# Patient Record
Sex: Male | Born: 1955
Health system: Southern US, Community
[De-identification: ages and names within clinical notes are randomized; demographics above are authoritative.]

## PROBLEM LIST (undated history)

## (undated) DIAGNOSIS — I428 Other cardiomyopathies: Secondary | ICD-10-CM

## (undated) DIAGNOSIS — Z79899 Other long term (current) drug therapy: Secondary | ICD-10-CM

## (undated) DIAGNOSIS — I10 Essential (primary) hypertension: Secondary | ICD-10-CM

## (undated) DIAGNOSIS — E785 Hyperlipidemia, unspecified: Secondary | ICD-10-CM

## (undated) DIAGNOSIS — T7840XA Allergy, unspecified, initial encounter: Secondary | ICD-10-CM

## (undated) DIAGNOSIS — I4819 Other persistent atrial fibrillation: Secondary | ICD-10-CM

## (undated) DIAGNOSIS — Z5181 Encounter for therapeutic drug level monitoring: Secondary | ICD-10-CM

## (undated) DIAGNOSIS — I639 Cerebral infarction, unspecified: Secondary | ICD-10-CM

## (undated) DIAGNOSIS — I480 Paroxysmal atrial fibrillation: Principal | ICD-10-CM

## (undated) HISTORY — PX: EYE SURGERY: SHX253

## (undated) HISTORY — DX: Hyperlipidemia, unspecified: E78.5

## (undated) HISTORY — PX: TONSILLECTOMY: SUR1361

## (undated) HISTORY — DX: Allergy, unspecified, initial encounter: T78.40XA

## (undated) HISTORY — PX: WISDOM TOOTH EXTRACTION: SHX21

## (undated) HISTORY — DX: Essential (primary) hypertension: I10

## (undated) HISTORY — PX: OTHER SURGICAL HISTORY: SHX169

## (undated) HISTORY — DX: Other cardiomyopathies: I42.8

## (undated) HISTORY — DX: Paroxysmal atrial fibrillation: I48.0

## (undated) HISTORY — DX: Other persistent atrial fibrillation: I48.19

## (undated) SURGERY — Surgical Case
Anesthesia: *Unknown

---

## 2001-01-02 ENCOUNTER — Ambulatory Visit (HOSPITAL_BASED_OUTPATIENT_CLINIC_OR_DEPARTMENT_OTHER): Admission: RE | Admit: 2001-01-02 | Discharge: 2001-01-02 | Payer: Self-pay | Admitting: Plastic Surgery

## 2010-11-27 ENCOUNTER — Other Ambulatory Visit: Payer: Self-pay | Admitting: Orthopedic Surgery

## 2010-11-27 ENCOUNTER — Ambulatory Visit (HOSPITAL_BASED_OUTPATIENT_CLINIC_OR_DEPARTMENT_OTHER)
Admission: RE | Admit: 2010-11-27 | Discharge: 2010-11-27 | Disposition: A | Discharge status: Home / Self Care | Payer: BC Managed Care – PPO | Source: Ambulatory Visit | Attending: Orthopedic Surgery | Admitting: Orthopedic Surgery

## 2010-11-27 DIAGNOSIS — M898X9 Other specified disorders of bone, unspecified site: Secondary | ICD-10-CM | POA: Insufficient documentation

## 2010-11-27 DIAGNOSIS — M6740 Ganglion, unspecified site: Secondary | ICD-10-CM | POA: Insufficient documentation

## 2011-03-02 NOTE — Op Note (Signed)
  NAME:  Dustin Schneider, Dustin Schneider              ACCOUNT NO.:  [id number]  MEDICAL RECORD NO.:  [id number]            PATIENT TYPE:  LOCATION:                                 FACILITY:  PHYSICIAN:  Cindee Salt, M.D.            DATE OF BIRTH:  DATE OF PROCEDURE:  11/27/2010 DATE OF DISCHARGE:                              OPERATIVE REPORT   PREOPERATIVE DIAGNOSIS:  Mucoid tumor, right middle finger.  POSTOPERATIVE DIAGNOSIS:  Mucoid tumor, right middle finger.  OPERATION:  Excision of mucoid cyst, debridement of distal interphalangeal joint, right middle finger.  SURGEON:  Cindee Salt, MD  ANESTHESIA:  Forearm-based IV regional with metacarpal block.  ANESTHESIOLOGISTJean Rosenthal.  HISTORY:  The patient is a 55 year old male with a history of a mucoid cyst of the distal interphalangeal joint area with significant deformation of his nail plate, right middle finger.  He is desirous of having this excised in that this has opened on occasion.  Pre, peri, postoperative courses have been discussed along with risks and complications.  He is aware that there is no guarantee with the surgery, possibility of infection, recurrence of injury to arteries, nerves, tendons, incomplete relief of symptoms, dystrophy.  In the preoperative area, the patient is seen.  The extremity marked by both the patient and surgeon.  Antibiotic given.  DESCRIPTION OF PROCEDURE:  The patient was brought to the operating room where a forearm-based IV regional anesthetic was carried out without difficulty, was prepped using DuraPrep, supine position with the right arm free.  A 3-minute dry time was allowed.  Time-out taken confirming the patient and procedure.  A curvilinear incision was made over the distal interphalangeal joint, right middle finger, carried down through subcutaneous tissue.  Bleeders were electrocauterized with bipolar. Dissection was then carried distally dorsal to the extensor tendon all to the level of  the cyst with blunt and sharp dissection.  This was identified and a small rongeur used to remove a portion of the cyst. Care was taken to protect the nail bed.  The joint was then opened.  The longitudinal incision.  The joint was then debrided.  A synovectomy performed and debridement of osteophytes performed with a small rongeur. The wound was copiously irrigated with saline.  The skin then closed with interrupted 5-0 Vicryl Rapide sutures.  Sterile compressive dressing and splint to the finger were applied.  Prior to application of the splint, a metacarpal block was given with 0.25% Marcaine without epinephrine, 9 mL was used.  With deflation of the tourniquet, all fingers immediately pinked.  He was taken to the recovery room for observation in satisfactory condition.  He will be discharged home to return to the [facility] [place] in 1 week on Vicodin.          ______________________________ Cindee Salt, M.D.     GK/MEDQ  D:  11/27/2010  T:  11/28/2010  Job:  [id number]  Electronically Signed by Cindee Salt M.D. on 03/02/2011 09:08:47 AM

## 2012-05-01 ENCOUNTER — Encounter (INDEPENDENT_AMBULATORY_CARE_PROVIDER_SITE_OTHER): Payer: Self-pay

## 2012-05-02 ENCOUNTER — Encounter (INDEPENDENT_AMBULATORY_CARE_PROVIDER_SITE_OTHER): Payer: Self-pay | Admitting: Surgery

## 2012-05-02 ENCOUNTER — Ambulatory Visit (INDEPENDENT_AMBULATORY_CARE_PROVIDER_SITE_OTHER): Payer: BC Managed Care – PPO | Admitting: Surgery

## 2012-05-02 VITALS — BP 132/82 | HR 72 | Temp 97.8°F | Resp 18 | Ht 68.0 in | Wt 165.0 lb

## 2012-05-02 DIAGNOSIS — L989 Disorder of the skin and subcutaneous tissue, unspecified: Secondary | ICD-10-CM

## 2012-05-02 NOTE — Progress Notes (Signed)
Patient ID: Dustin Schneider, male   DOB: 08-07-1956, 56 y.o.   MRN: [id number]  No chief complaint on file.   HPI Dustin Schneider is a 56 y.o. male.  Patient presents for multiple skin lesions on scalp. These areas being larger and causing discomfort. HPI  History reviewed. No pertinent past medical history.  Past Surgical History  Procedure Date  . Tonsillectomy   . Excision of actinic keratosis   . Eye surgery     eye lift    History reviewed. No pertinent family history.  Social History History  Substance Use Topics  . Smoking status: Never Smoker   . Smokeless tobacco: Never Used  . Alcohol Use: Yes    Allergies no known allergies  Current Outpatient Prescriptions  Medication Sig Dispense Refill  . aspirin 81 MG tablet Take 81 mg by mouth daily.        Review of Systems Review of Systems  Constitutional: Negative for fever, chills and unexpected weight change.  HENT: Negative for hearing loss, congestion, sore throat, trouble swallowing and voice change.   Eyes: Negative for visual disturbance.  Respiratory: Negative for cough and wheezing.   Cardiovascular: Negative for chest pain, palpitations and leg swelling.  Gastrointestinal: Negative for nausea, vomiting, abdominal pain, diarrhea, constipation, blood in stool, abdominal distention, anal bleeding and rectal pain.  Genitourinary: Negative for hematuria and difficulty urinating.  Musculoskeletal: Negative for arthralgias.  Skin: Negative for rash and wound.  Neurological: Negative for seizures, syncope, weakness and headaches.  Hematological: Negative for adenopathy. Does not bruise/bleed easily.  Psychiatric/Behavioral: Negative for confusion.    Blood pressure 132/82, pulse 72, temperature 97.8 F (36.6 C), temperature source Temporal, resp. rate 18, height 5\' 8"  (1.727 m), weight 165 lb (74.844 kg).  Physical Exam Physical Exam  Constitutional: He is oriented to person, place, and time. He appears  well-developed and well-nourished.  HENT:  Head:    Eyes: EOM are normal. Pupils are equal, round, and reactive to light.  Neurological: He is alert and oriented to person, place, and time.  Skin: Skin is warm and dry.     Psychiatric: He has a normal mood and affect. His behavior is normal. Judgment and thought content normal.      Assessment    Multiple suspicious skin lesions scalp and left arm     Plan    Will need about 40 min to excise these lesions. Return to office at later time.        Dustin Schneider A. 05/02/2012, 11:10 AM

## 2012-05-02 NOTE — Patient Instructions (Signed)
Return to office for scalp lesion skin excision.

## 2012-06-10 ENCOUNTER — Encounter (INDEPENDENT_AMBULATORY_CARE_PROVIDER_SITE_OTHER): Payer: Self-pay | Admitting: Surgery

## 2012-06-10 ENCOUNTER — Ambulatory Visit (INDEPENDENT_AMBULATORY_CARE_PROVIDER_SITE_OTHER): Payer: BC Managed Care – PPO | Admitting: Surgery

## 2012-06-10 VITALS — BP 136/68 | HR 72 | Temp 97.6°F | Resp 20 | Ht 67.5 in | Wt 162.2 lb

## 2012-06-10 DIAGNOSIS — L989 Disorder of the skin and subcutaneous tissue, unspecified: Secondary | ICD-10-CM

## 2012-06-10 NOTE — Progress Notes (Signed)
Subjective:     Patient ID: Dustin Schneider, male   DOB: 02-Jul-1956, 56 y.o.   MRN: [id number]  HPI patient returns for excision of skin lesion from his left scalp. He had 2 other areas and these have disappeared.  Review of Systems  Constitutional: Negative.   HENT: Negative.   Respiratory: Negative.        Objective:   Physical Exam  Constitutional: He is oriented to person, place, and time.  HENT:  Head:    Neurological: He is alert and oriented to person, place, and time.  Skin: Skin is warm and dry.  Psychiatric: He has a normal mood and affect. His behavior is normal. Judgment and thought content normal.       Assessment:     Skin lesion scalp 1.3 cm     Plan:     Excise in the office.  Informed consent obtained. Left scalp was prepped and draped in a sterile fashion. 1% lidocaine with epinephrine used to inject the lesion in the left lateral scalp. It measured 1.3 cm. Curvilinear incision made around the lesion into the subcutaneous fat. Incision closed with 3-0 Vicryl and dressed with Neosporin. Procedure tolerated well. Wound instructions given. Followup as needed.

## 2012-06-10 NOTE — Patient Instructions (Signed)
Neosporin to wound daily.  Shower tomorrow.

## 2012-06-12 ENCOUNTER — Telehealth (INDEPENDENT_AMBULATORY_CARE_PROVIDER_SITE_OTHER): Payer: Self-pay

## 2012-06-12 NOTE — Telephone Encounter (Signed)
Called patient to give him benign path results. Left message on machine per patients request. Told him to call with any questions or concerns.

## 2018-04-23 ENCOUNTER — Encounter: Payer: Self-pay | Admitting: Internal Medicine

## 2018-05-05 ENCOUNTER — Encounter: Payer: Self-pay | Admitting: Internal Medicine

## 2018-05-12 NOTE — H&P (Signed)
OFFICE VISIT NOTES COPIED TO EPIC FOR DOCUMENTATION  . History of Present Illness Dustin Maine FNP-C; 04/30/2018 9:51 PM) Patient words: Follow up echo & lab results; Last office visit 03/19/18.  The patient is a 62 year old male who presents for a follow-up for Irregular heart beat. Dustin Schneider is a Caucasian male with newly noted atrial fibrillation with RVR that had not been seen by a physician in 7 years until recently evaluated by [place] for his palpitations. He was also noted to be hypertensive and was started on valsartan. He underwent echocardiogram and lab results for risk stratification and now presents to discuss results.  Patient continues to have palpitations, but felt that they have improved. He has not had any recurrence of chest pressure. He is tolerating medications well. He has continued to exercise with riding his bike twice weekly without exertional difficulty. He does admit to drinking 2-3 glasses of wine, a glass of scotch, and possibly one to 2 beers at night. His father also has a history of atrial fibrillation. Patient denies any symptoms of shortness of breath, fatigue, dyspnea on exertion, lightheadedness, syncope, or bleeding diathesis.   Problem List/Past Medical (April Harrington; 04/30/2018 12:00 PM) Arrhythmia (I49.9)  Laboratory examination (Z01.89)  04/03/2018: Creatinine 1.09, EGFR 71/82, potassium 4.6, BMP normal. CBC normal. TSH 3.14. Cholesterol 184, triglycerides 116, HDL 46, LDL 115. 03/20/2018: Creatinine 1.13, EGFR 69/80, potassium 4.4, ALT 49, CMP otherwise normal. 06/07/2006: Creatinine 1.0, Potassium 5.4, CMP normal. Cholesterol 247, Triglycerides 103, HDL 78, LDL 160. CBC normal. New onset atrial fibrillation (I48.91)  EKG 03/19/2018: Atrial fibrillation with rapid ventricular response at the rate of 120 bpm, normal axis, diffuse nonspecific ST-T abnormality. Cannot exclude inferior ischemia. Essential hypertension (I10)  Abnormal EKG (R94.31)    Allergies (April Harrington; 04/30/2018 12:00 PM) No Known Drug Allergies [03/19/2018]:  Family History (April Harrington; 04/30/2018 12:00 PM) Mother  In good health. glaucoma, no heart attacks or strokes, no known cardiovascular conditions Father  In stable health. no heart attacks or strokes, no known cardiovascular conditions  Social History (April Harrington; 04/30/2018 12:00 PM) Current tobacco use  Smokes cigars. rarely 1-2 times per year Alcohol Use  Occasional alcohol use. Marital status  Divorced. Living Situation  Lives with relatives. son lives with pt Number of Children  1.  Past Surgical History (April Harrington; 04/30/2018 12:00 PM) Cyst Removal  rt hand 5 yrs ago, from back 10 yrs ago  Medication History Dustin Maine, FNP-C; 04/30/2018 9:52 PM) Metoprolol Succinate ER (50MG Tablet ER 24HR, 1 (one) Tablet Oral daily, Taken starting 03/19/2018) Active. Xarelto (20MG Tablet, 1 (one) Tablet Oral every evening after dinner, Taken starting 03/19/2018) Active. Valsartan (160MG Tablet, 1 (one) Tablet Oral daily, Taken starting 03/19/2018) Active. Medications Reconciled (verbally with pt; medication present)  Diagnostic Studies History (April Harrington; 04/30/2018 12:01 PM) Echocardiogram  Echo- 04/23/2018 1. Left ventricle cavity is normal in size. Mild decrease in global wall motion. Calculated EF 45%. 2. Mild to moderate aortic regurgitation. 3. Moderate (Grade III) mitral regurgitation. 4. Mild tricuspid regurgitation. PA pressure could not be calculated due to insignificant TR signal. None [04/30/2018]: (Marked as Inactive)    Review of Systems Dustin Maine FNP-C; 04/30/2018 9:51 PM) General Not Present- Appetite Loss and Weight Gain. Respiratory Not Present- Chronic Cough and Wakes up from Sleep Wheezing or Short of Breath. Cardiovascular Present- Irregular Heart Beat. Not Present- Chest Pain, Claudications, Difficulty Breathing On Exertion,  Orthopnea, Paroxysmal Nocturnal Dyspnea and Shortness of Breath. Gastrointestinal  Present- Change in Bowel Habits (since being on Metoprolol). Not Present- Black, Tarry Stool, Bloody Stool, Difficulty Swallowing and Hematemesis. Musculoskeletal Not Present- Decreased Range of Motion and Muscle Atrophy. Neurological Not Present- Attention Deficit. Psychiatric Not Present- Personality Changes and Suicidal Ideation. Endocrine Not Present- Cold Intolerance and Heat Intolerance. Hematology Not Present- Abnormal Bleeding. All other systems negative  Vitals (April Harrington; 04/30/2018 12:08 PM) 04/30/2018 12:04 PM Weight: 169.31 lb Height: 67in Body Surface Area: 1.88 m Body Mass Index: 26.52 kg/m  Pulse: 68 (Regular)  P.OX: 98% (Room air) BP: 126/84 (Sitting, Left Arm, Standard)       Physical Exam Dustin Maine, FNP-C; 04/30/2018 10:0 PM) General Mental Status-Alert. General Appearance-Cooperative and Appears stated age. Build & Nutrition-Well built and Well nourished.  Head and Neck Thyroid Gland Characteristics - normal size and consistency and no palpable nodules.  Chest and Lung Exam Chest and lung exam reveals -quiet, even and easy respiratory effort with no use of accessory muscles, non-tender and on auscultation, normal breath sounds, no adventitious sounds.  Cardiovascular Cardiovascular examination reveals -carotid auscultation reveals no bruits, abdominal aorta auscultation reveals no bruits and no prominent pulsation, femoral artery auscultation bilaterally reveals normal pulses, no bruits, no thrills, normal pedal pulses bilaterally and no digital clubbing, cyanosis, edema, increased warmth or tenderness. Auscultation Rhythm - Irregularly irregular. Heart Sounds - S1 is variable, S2 WNL and No gallop present. Murmurs & Other Heart Sounds - Normal exam - No Murmurs.  Abdomen Palpation/Percussion Normal exam - Non Tender and No  hepatosplenomegaly.  Neurologic Neurologic evaluation reveals -alert and oriented x 3 with no impairment of recent or remote memory. Motor-Grossly intact without any focal deficits.  Musculoskeletal Global Assessment Left Lower Extremity - no deformities, masses or tenderness, no known fractures. Right Lower Extremity - no deformities, masses or tenderness, no known fractures.  Assessment & Plan Dustin Maine FNP-C; 04/30/2018 9:59 PM) Atrial fibrillation with controlled ventricular rate (I48.91) Story: EKG 04/30/2018: Atrial fibrillation with controlled ventricular response at the rate of 70 bpm, normal axis, diffuse nonspecific ST-T abnormality. Cannot exclude inferior ischemia. Compared to EKG 03/19/2018, rate is now controlled. Impression: CHA2DS2-VASc Score is 1 with yearly risk of stroke of 1.3%. (CHF; HTN; vasc disease DM, Male = 1; Age <65 =1;  65-74 = 2, >75 =3; stroke = 3).  Exercise sestamibi stress test 05/05/2018: 1. The resting electrocardiogram demonstrated atrial fibrillation.  The stress electrocardiogram was positive for ischemia with 2 mm downsloping ST depression in the inferior and lateral leads, persisted for 2 mintues into recovery. Stress symptoms included palpitations and fatigue. Patient exercised on Bruce protocol for 6:00 minutes and achieved 7.05 METS. Stress test terminated due to fatigue and 113% MPHR achieved (Target HR >85%).  2. Left ventricular cavity is noted to be normal on the rest and stress studies.  SPECT images demonstrate homogeneous tracer distribution throughout the myocardium.  The left ventricular ejection fraction was calculated  to be 38%, but visually appears to be at least low normal without wall motion abnormality.  This is an intermediate risk study due to abnormal ST change and low LVEF (difficult in view of gating with A. Fib), clinical correlation recommended.  Essential hypertension (I10) Abnormal EKG (R94.31) Hyperglycemia  (R73.9) Current Plans HEMOGLOBIN GLYCLATED (HGB A1C) (23953) Alcohol abuse (F10.10) Moderate mitral regurgitation (I34.0) Laboratory examination (U02.33) Story:  05/07/2018: Glucose 106, creatinine 1.18, EGFR 65/75, sodium 145, potassium 4.2, BMP otherwise normal. 04/03/2018: Creatinine 1.09, EGFR 71/82, potassium 4.6, BMP normal. CBC normal.  TSH 3.14. Cholesterol 184, triglycerides 116, HDL 46, LDL 115.  03/20/2018: Creatinine 1.13, EGFR 69/80, potassium 4.4, ALT 49, CMP otherwise normal.  06/07/2006: Creatinine 1.0, Potassium 5.4, CMP normal. Cholesterol 247, Triglycerides 103, HDL 78, LDL 160. CBC normal.  Note:-  Recommendations:  Patient presents for follow-up for atrial fibrillation and to discuss recently obtained labs and echocardiogram results. I have discussed echocardiogram results that shows mildly decreased systolic function with LVEF at 45%, mild-to-moderate aortic regurgitation, and moderate MR. He is without any clinical evidence of heart failure and is likely attributed to his atrial fibrillation. He is now rate controlled with metoprolol succinate and will continue the same. He will need ischemic workup and will obtain exercise nuclear stress test for further evaluation. (Reviewed, intermediate stress test with positive EKG change but no ischemia on perfusion imaging, suspect A. Fib related)  As he has been on anticoagulation for the last 4 weeks, we'll schedule for cardioversion. Schedule for Direct current cardioversion. I have discussed regarding risks benefits rate control vs rhythm control with the patient. Patient understands cardiac arrest and need for CPR, aspiration pneumonia, but not limited to these. Patient is willing. It will be scheduled for in the next few weeks.  Laboratory results were discussed with the patient, has mildly elevated lipids and depending upon stress test results, will decide if he needs statin therapy at that time. Encouraged him to make  diet changes and also cut back on his alcohol use. He was noted to have hyperglycemia and will obtain hemoglobin A1c for further evaluation. I will see him back after the procedure for follow-up.  CC: Antony Contras, MD    Signed by Dustin Maine, FNP-C (04/30/2018 10:00 PM)

## 2018-05-13 ENCOUNTER — Ambulatory Visit (HOSPITAL_COMMUNITY): Payer: BC Managed Care – PPO | Admitting: Anesthesiology

## 2018-05-13 ENCOUNTER — Encounter (HOSPITAL_COMMUNITY)
Admission: RE | Disposition: A | Discharge status: Home / Self Care | Payer: Self-pay | Source: Ambulatory Visit | Attending: Cardiology

## 2018-05-13 ENCOUNTER — Other Ambulatory Visit: Payer: Self-pay

## 2018-05-13 ENCOUNTER — Ambulatory Visit (HOSPITAL_COMMUNITY)
Admission: RE | Admit: 2018-05-13 | Discharge: 2018-05-13 | Disposition: A | Discharge status: Home / Self Care | Payer: BC Managed Care – PPO | Source: Ambulatory Visit | Attending: Cardiology | Admitting: Cardiology

## 2018-05-13 ENCOUNTER — Encounter (HOSPITAL_COMMUNITY): Payer: Self-pay | Admitting: *Deleted

## 2018-05-13 DIAGNOSIS — F1010 Alcohol abuse, uncomplicated: Secondary | ICD-10-CM | POA: Diagnosis not present

## 2018-05-13 DIAGNOSIS — I10 Essential (primary) hypertension: Secondary | ICD-10-CM | POA: Insufficient documentation

## 2018-05-13 DIAGNOSIS — I340 Nonrheumatic mitral (valve) insufficiency: Secondary | ICD-10-CM | POA: Diagnosis not present

## 2018-05-13 DIAGNOSIS — I4891 Unspecified atrial fibrillation: Secondary | ICD-10-CM | POA: Insufficient documentation

## 2018-05-13 DIAGNOSIS — F17290 Nicotine dependence, other tobacco product, uncomplicated: Secondary | ICD-10-CM | POA: Diagnosis not present

## 2018-05-13 DIAGNOSIS — R9431 Abnormal electrocardiogram [ECG] [EKG]: Secondary | ICD-10-CM | POA: Insufficient documentation

## 2018-05-13 DIAGNOSIS — R739 Hyperglycemia, unspecified: Secondary | ICD-10-CM | POA: Diagnosis not present

## 2018-05-13 HISTORY — PX: CARDIOVERSION: SHX1299

## 2018-05-13 SURGERY — CARDIOVERSION
Anesthesia: General

## 2018-05-13 MED ORDER — PROPOFOL 10 MG/ML IV BOLUS
INTRAVENOUS | Status: DC | PRN
  Administered 2018-05-13: 30 mg via INTRAVENOUS
  Administered 2018-05-13: 50 mg via INTRAVENOUS

## 2018-05-13 MED ORDER — LIDOCAINE 2% (20 MG/ML) 5 ML SYRINGE
INTRAMUSCULAR | Status: DC | PRN
  Administered 2018-05-13: 40 mg via INTRAVENOUS

## 2018-05-13 MED ORDER — SODIUM CHLORIDE 0.9 % IV SOLN
INTRAVENOUS | Status: DC | PRN
  Administered 2018-05-13: 14:00:00 via INTRAVENOUS

## 2018-05-13 NOTE — Transfer of Care (Signed)
Immediate Anesthesia Transfer of Care Note  Patient: Dustin Schneider  Procedure(s) Performed: CARDIOVERSION (N/A )  Patient Location: PACU and Endoscopy Unit  Anesthesia Type:General  Level of Consciousness: awake, alert  and patient cooperative  Airway & Oxygen Therapy: Patient Spontanous Breathing and Patient connected to nasal cannula oxygen  Post-op Assessment: Report given to RN and Post -op Vital signs reviewed and stable  Post vital signs: Reviewed and stable  Last Vitals:  Vitals Value Taken Time  BP    Temp    Pulse    Resp    SpO2      Last Pain:  Vitals:   05/13/18 1300  TempSrc: Oral  PainSc: 0-No pain         Complications: No apparent anesthesia complications

## 2018-05-13 NOTE — Anesthesia Postprocedure Evaluation (Signed)
Anesthesia Post Note  Patient: Dustin Schneider  Procedure(s) Performed: CARDIOVERSION (N/A )     Patient location during evaluation: PACU Anesthesia Type: General Level of consciousness: awake and alert Pain management: pain level controlled Vital Signs Assessment: post-procedure vital signs reviewed and stable Respiratory status: spontaneous breathing, nonlabored ventilation and respiratory function stable Cardiovascular status: blood pressure returned to baseline and stable Postop Assessment: no apparent nausea or vomiting Anesthetic complications: no    Last Vitals:  Vitals:   05/13/18 1401 05/13/18 1424  BP: 111/79 119/65  Pulse: 60   Resp: 20   Temp: 36.9 C   SpO2: 99%     Last Pain:  Vitals:   05/13/18 1401  TempSrc: Oral  PainSc: 0-No pain                 Dustin Pierro A.

## 2018-05-13 NOTE — Interval H&P Note (Signed)
History and Physical Interval Note:  05/13/2018 1:47 PM  Dustin Schneider  has presented today for surgery, with the diagnosis of AFIB  The various methods of treatment have been discussed with the patient and family. After consideration of risks, benefits and other options for treatment, the patient has consented to  Procedure(s): CARDIOVERSION (N/A) as a surgical intervention .  The patient's history has been reviewed, patient examined, no change in status, stable for surgery.  I have reviewed the patient's chart and labs.  Questions were answered to the patient's satisfaction.     [name]

## 2018-05-13 NOTE — CV Procedure (Signed)
Direct current cardioversion:  Indication symptomatic A. Fibrillation.  Procedure: Using 80 mg of IV Propofol and 40 IV Lidocaine (for reducing venous pain) for achieving deep sedation, synchronized direct current cardioversion performed. Patient was delivered with 120 Joules of electricity X 1 with success to NSR. Patient tolerated the procedure well. No immediate complication noted.

## 2018-05-13 NOTE — Anesthesia Procedure Notes (Signed)
Procedure Name: General with mask airway Date/Time: 05/13/2018 1:50 PM Performed by: Renato Shin, CRNA Pre-anesthesia Checklist: Patient identified, Emergency Drugs available, Suction available and Patient being monitored Patient Re-evaluated:Patient Re-evaluated prior to induction Oxygen Delivery Method: Ambu bag Preoxygenation: Pre-oxygenation with 100% oxygen Induction Type: IV induction Ventilation: Mask ventilation without difficulty Placement Confirmation: positive ETCO2,  CO2 detector and breath sounds checked- equal and bilateral Dental Injury: Teeth and Oropharynx as per pre-operative assessment

## 2018-05-13 NOTE — Anesthesia Preprocedure Evaluation (Signed)
Anesthesia Evaluation  Patient identified by MRN, date of birth, ID band Patient awake    Reviewed: Allergy & Precautions, NPO status , Patient's Chart, lab work & pertinent test results  Airway Mallampati: II  TM Distance: >3 FB Neck ROM: Full    Dental no notable dental hx. (+) Teeth Intact   Pulmonary neg pulmonary ROS,    Pulmonary exam normal breath sounds clear to auscultation       Cardiovascular negative cardio ROS Normal cardiovascular exam+ dysrhythmias Atrial Fibrillation  Rhythm:Regular Rate:Normal     Neuro/Psych negative neurological ROS  negative psych ROS   GI/Hepatic negative GI ROS, Neg liver ROS,   Endo/Other  negative endocrine ROS  Renal/GU negative Renal ROS  negative genitourinary   Musculoskeletal negative musculoskeletal ROS (+)   Abdominal   Peds  Hematology xarelto- last dose yesterday pm   Anesthesia Other Findings   Reproductive/Obstetrics                             Anesthesia Physical Anesthesia Plan  ASA: II  Anesthesia Plan: General   Post-op Pain Management:    Induction: Intravenous  PONV Risk Score and Plan: 2 and Ondansetron, Propofol infusion and Treatment may vary due to age or medical condition  Airway Management Planned: Mask  Additional Equipment:   Intra-op Plan:   Post-operative Plan:   Informed Consent: I have reviewed the patients History and Physical, chart, labs and discussed the procedure including the risks, benefits and alternatives for the proposed anesthesia with the patient or authorized representative who has indicated his/her understanding and acceptance.   Dental advisory given  Plan Discussed with: CRNA  Anesthesia Plan Comments:         Anesthesia Quick Evaluation

## 2018-05-13 NOTE — Discharge Instructions (Signed)
Electrical Cardioversion, Care After °This sheet gives you information about how to care for yourself after your procedure. Your health care provider may also give you more specific instructions. If you have problems or questions, contact your health care provider. °What can I expect after the procedure? °After the procedure, it is common to have: °· Some redness on the skin where the shocks were given. ° °Follow these instructions at home: °· Do not drive for 24 hours if you were given a medicine to help you relax (sedative). °· Take over-the-counter and prescription medicines only as told by your health care provider. °· Ask your health care provider how to check your pulse. Check it often. °· Rest for 48 hours after the procedure or as told by your health care provider. °· Avoid or limit your caffeine use as told by your health care provider. °Contact a health care provider if: °· You feel like your heart is beating too quickly or your pulse is not regular. °· You have a serious muscle cramp that does not go away. °Get help right away if: °· You have discomfort in your chest. °· You are dizzy or you feel faint. °· You have trouble breathing or you are short of breath. °· Your speech is slurred. °· You have trouble moving an arm or leg on one side of your body. °· Your fingers or toes turn cold or blue. °This information is not intended to replace advice given to you by your health care provider. Make sure you discuss any questions you have with your health care provider. °Document Released: 05/20/2013 Document Revised: 03/02/2016 Document Reviewed: 02/03/2016 °Elsevier Interactive Patient Education © 2018 [employer] ° °

## 2018-05-15 ENCOUNTER — Encounter (HOSPITAL_COMMUNITY): Payer: Self-pay | Admitting: Cardiology

## 2018-05-22 ENCOUNTER — Encounter: Payer: Self-pay | Admitting: Internal Medicine

## 2018-06-02 ENCOUNTER — Ambulatory Visit: Payer: BC Managed Care – PPO | Admitting: Internal Medicine

## 2018-06-02 ENCOUNTER — Encounter: Payer: Self-pay | Admitting: Internal Medicine

## 2018-06-02 VITALS — BP 136/80 | HR 86 | Ht 67.5 in | Wt 170.2 lb

## 2018-06-02 DIAGNOSIS — I4819 Other persistent atrial fibrillation: Secondary | ICD-10-CM | POA: Diagnosis not present

## 2018-06-02 DIAGNOSIS — Z23 Encounter for immunization: Secondary | ICD-10-CM | POA: Diagnosis not present

## 2018-06-02 NOTE — Progress Notes (Signed)
Electrophysiology Office Note   Date:  06/02/2018   ID:  AMADI FRADY, DOB [birth year], MRN [id number]  PCP:  none Cardiologist:  Dr [name] Primary Electrophysiologist: Thompson Grayer, MD    CC: afib   History of Present Illness: Dustin Schneider is a 62 y.o. male who presents today for electrophysiology evaluation.   He is referred by Dr [name] for EP consultation regarding atrial fibrillation. He reports having episodes of afib for about 10 years.  Episodes would typically be triggered by heavy ETOH and terminate overnight.  He has had increasing frequency and duration of afib with time.  He has been in persistent atrial fibrillation since July 2019.  Though he thinks AF started in July, the duration was unknown as he had not seen a physician in 7 years.  He had echo which revealed EF 45% with moderate MR.  He underwent cardioversion 05/13/18 but returned to AF the same day. He had stress test which was felt to be nonischemic.  He has palpitations and fatigue with his afib.  He is active.  He has cut back on ETOH but continues to drink 2 drinks per day.  Today, he denies symptoms of chest pain, shortness of breath, orthopnea, PND, lower extremity edema, claudication, dizziness, presyncope, syncope, bleeding, or neurologic sequela. The patient is tolerating medications without difficulties and is otherwise without complaint today.    Past Medical History:  Diagnosis Date  . Hypertension   . Nonischemic cardiomyopathy ([place])   . Persistent atrial fibrillation    Past Surgical History:  Procedure Laterality Date  . CARDIOVERSION N/A 05/13/2018   Procedure: CARDIOVERSION;  Surgeon: [name], MD;  Location: [place];  Service: Cardiovascular;  Laterality: N/A;  . excision of actinic keratosis    . EYE SURGERY     eye lift  . TONSILLECTOMY       Current Outpatient Medications  Medication Sig Dispense Refill  . metoprolol succinate (TOPROL-XL) 50 MG 24 hr tablet Take 50 mg by  mouth daily. Take with or immediately following a meal.    . rivaroxaban (XARELTO) 20 MG TABS tablet Take 20 mg by mouth daily with supper.    . valsartan (DIOVAN) 160 MG tablet Take 160 mg by mouth every evening.     No current facility-administered medications for this visit.     Allergies:   Patient has no known allergies.   Social History:  The patient  reports that he has never smoked. He has never used smokeless tobacco. He reports that he drinks alcohol. He reports that he does not use drugs.   Family History:  The patient's family history includes Atrial fibrillation in his father; Glaucoma in his mother.    ROS:  Please see the history of present illness.   All other systems are personally reviewed and negative.    PHYSICAL EXAM: VS:  BP 136/80   Pulse 86   Ht 5' 7.5" (1.715 m)   Wt 170 lb 3.2 oz (77.2 kg)   SpO2 98%   BMI 26.26 kg/m  , BMI Body mass index is 26.26 kg/m. GEN: Well nourished, well developed, in no acute distress  HEENT: normal  Neck: no JVD, carotid bruits, or masses Cardiac: iRRR; no murmurs, rubs, or gallops,no edema  Respiratory:  clear to auscultation bilaterally, normal work of breathing GI: soft, nontender, nondistended, + BS MS: no deformity or atrophy  Skin: warm and dry  Neuro:  Strength and sensation are intact Psych: euthymic mood, full  affect  EKG:  EKG is ordered today. The ekg ordered today is personally reviewed and shows afib, V rate 86 bpm, Qtc 445 msec, nonspecific ST/T changes    Wt Readings from Last 3 Encounters:  06/02/18 170 lb 3.2 oz (77.2 kg)  05/13/18 164 lb (74.4 kg)  06/10/12 162 lb 3.2 oz (73.6 kg)      Other studies personally reviewed: Additional studies/ records that were reviewed today include: 14 pages of records from Dr Irven Shelling office are reviewed  Review of the above records today demonstrates: as above  Echo 04/23/18- EF 45%, mild to moderate AI, moderate MR, LA 68mm,   ASSESSMENT AND PLAN:  1.   Persistent afib The patient has symptomatic, recurrent persistent atrial fibrillation. He has not tried AAD therapy Chads2vasc score is 2.  he is anticoagulated with xarelto. Therapeutic strategies for afib including medicine (tikosyn) and ablation were discussed in detail with the patient today. HRS guidelines are clear that ablation is not to be used as primary treatment strategy for persistent afib.  AAD therapy should be first line.  We discussed options at length today.  I think that his best option would be tikosyn.  He would prefer a conservative approach.  We will therefore arrange for tikosyn admission at the next available time.  2. ETOH Avoidance encouraged  3. Nonischemic CM Hopefully EF will recover with sinus rhythm    Current medicines are reviewed at length with the patient today.   The patient does not have concerns regarding his medicines.  The following changes were made today:  none  Labs/ tests ordered today include:  Orders Placed This Encounter  Procedures  . Flu Vaccine QUAD 36+ mos IM  . EKG 12-Lead     Signed, Thompson Grayer, MD  06/02/2018 9:32 AM     [facility] [address] [place] [address] [phone] (office) [phone] (fax)

## 2018-06-02 NOTE — Patient Instructions (Addendum)
Medication Instructions:  Your physician recommends that you continue on your current medications as directed. Please refer to the Current Medication list given to you today.  Labwork: None ordered.  Testing/Procedures: None ordered.  Follow-Up: You will go to the Afib clinic on:  June 12, 2018 at 9:30 am   Any Other Special Instructions Will Be Listed Below (If Applicable).  If you need a refill on your cardiac medications before your next appointment, please call your pharmacy.    Dofetilide capsules What is this medicine? DOFETILIDE (doe FET il ide) is an antiarrhythmic drug. It helps make your heart beat regularly. This medicine also helps to slow rapid heartbeats. This medicine may be used for other purposes; ask your health care provider or pharmacist if you have questions. COMMON BRAND NAME(S): Tikosyn What should I tell my health care provider before I take this medicine? They need to know if you have any of these conditions: -heart disease -history of low levels of potassium or magnesium -kidney disease -liver disease -an unusual or allergic reaction to dofetilide, other medicines, foods, dyes, or preservatives -pregnant or trying to get pregnant -breast-feeding How should I use this medicine? Take this medicine by mouth with a glass of water. Follow the directions on the prescription label. You can take this medicine with or without food. Do not drink grapefruit juice with this medicine. Take your doses at regular intervals. Do not take your medicine more often than directed. Do not stop taking this medicine suddenly. This may cause serious, heart-related side effects. Your doctor will tell you how much medicine to take. If your doctor wants you to stop the medicine, the dose will be slowly lowered over time to avoid any side effects. Talk to your pediatrician regarding the use of this medicine in children. Special care may be needed. Overdosage: If you think you have  taken too much of this medicine contact a poison control center or emergency room at once. NOTE: This medicine is only for you. Do not share this medicine with others. What if I miss a dose? If you miss a dose, take it as soon as you can. If it is almost time for your next dose, take only that dose. Do not take double or extra doses. What may interact with this medicine? Do not take this medicine with any of the following medications: -cimetidine -dolutegravir -hydrochlorothiazide alone or in combination with other medicines -isavuconazonium -ketoconazole -megestrol -other medicines that prolong the QT interval (cause an abnormal heart rhythm) -prochlorperazine -trimethoprim alone or in combination with sulfamethoxazole -verapamil This medicine may also interact with the following medications: -amiloride -certain antidepressants like fluvoxamine or paroxetine -certain antiviral medicines for HIV or AIDS like atazanavir or darunavir -certain medicines for fungal infections like clotrimazole or miconazole -digoxin -diltiazem -dronabinol, THC -grapefruit juice -metformin -nefazodone -triamterene -zafirlukast This list may not describe all possible interactions. Give your health care provider a list of all the medicines, herbs, non-prescription drugs, or dietary supplements you use. Also tell them if you smoke, drink alcohol, or use illegal drugs. Some items may interact with your medicine. What should I watch for while using this medicine? Visit your doctor or health care professional for regular checks on your progress. Wear a medical ID bracelet or chain, and carry a card that describes your disease and details of your medicine and dosage times. Check your heart rate and blood pressure regularly while you are taking this medicine. Ask your doctor or health care professional what your heart  rate and blood pressure should be, and when you should contact him or her. Your doctor or health  care professional also may schedule regular tests to check your progress. You will be started on this medicine in a specialized facility for at least three days. You will be monitored to find the right dose of medicine for you. It is very important that you take your medicine exactly as prescribed when you leave the hospital. The correct dosing of this medicine is very important to treat your condition and prevent possible serious side effects. What side effects may I notice from receiving this medicine? Side effects that you should report to your doctor or health care professional as soon as possible: -allergic reactions like skin rash, itching or hives, swelling of the face, lips, or tongue -breathing problems -dizziness -fast or rapid beating of the heart -feeling faint or lightheaded -swelling of the ankles -unusually weak or tired -vomiting Side effects that usually do not require medical attention (report to your doctor or health care professional if they continue or are bothersome): -cough -diarrhea -difficulty sleeping -headache -nausea -stomach pain This list may not describe all possible side effects. Call your doctor for medical advice about side effects. You may report side effects to FDA at 1-800-FDA-1088. Where should I keep my medicine? Keep out of the reach of children. Store at room temperature between 15 and 30 degrees C (59 and 86 degrees F). Protect the medicine from moisture or humidity. Keep container tightly closed. Throw away any unused medicine after the expiration date. NOTE: This sheet is a summary. It may not cover all possible information. If you have questions about this medicine, talk to your doctor, pharmacist, or health care provider.  2018 Elsevier/Gold Standard (2016-06-11 95:18:84)

## 2018-06-06 ENCOUNTER — Telehealth: Payer: Self-pay | Admitting: Pharmacist

## 2018-06-06 NOTE — Telephone Encounter (Signed)
Medication list reviewed in anticipation of upcoming Tikosyn initiation. Patient is not taking any contraindicated or QTc prolonging medications based on current medication list.   Patient is anticoagulated on Xarelto on the appropriate dose. Please ensure that patient has not missed any anticoagulation doses in the 3 weeks prior to Tikosyn initiation.   Patient will need to be counseled to avoid use of Benadryl while on Tikosyn and in the 2-3 days prior to Tikosyn initiation.

## 2018-06-12 ENCOUNTER — Other Ambulatory Visit: Payer: Self-pay

## 2018-06-12 ENCOUNTER — Inpatient Hospital Stay (HOSPITAL_COMMUNITY)
Admission: AD | Admit: 2018-06-12 | Discharge: 2018-06-15 | DRG: 310 | Disposition: A | Discharge status: Home / Self Care | Payer: BC Managed Care – PPO | Attending: Internal Medicine | Admitting: Internal Medicine

## 2018-06-12 ENCOUNTER — Encounter (HOSPITAL_COMMUNITY): Payer: Self-pay | Admitting: General Practice

## 2018-06-12 ENCOUNTER — Ambulatory Visit (HOSPITAL_COMMUNITY)
Admission: RE | Admit: 2018-06-12 | Discharge: 2018-06-12 | Disposition: A | Discharge status: Home / Self Care | Payer: BC Managed Care – PPO | Source: Ambulatory Visit | Attending: Nurse Practitioner | Admitting: Nurse Practitioner

## 2018-06-12 ENCOUNTER — Encounter (HOSPITAL_COMMUNITY): Payer: Self-pay | Admitting: Nurse Practitioner

## 2018-06-12 VITALS — BP 118/72 | HR 80 | Ht 67.5 in | Wt 171.0 lb

## 2018-06-12 DIAGNOSIS — Z7901 Long term (current) use of anticoagulants: Secondary | ICD-10-CM

## 2018-06-12 DIAGNOSIS — I4819 Other persistent atrial fibrillation: Secondary | ICD-10-CM

## 2018-06-12 DIAGNOSIS — Z79899 Other long term (current) drug therapy: Secondary | ICD-10-CM

## 2018-06-12 DIAGNOSIS — I480 Paroxysmal atrial fibrillation: Secondary | ICD-10-CM

## 2018-06-12 DIAGNOSIS — E876 Hypokalemia: Secondary | ICD-10-CM | POA: Diagnosis present

## 2018-06-12 DIAGNOSIS — Z5181 Encounter for therapeutic drug level monitoring: Secondary | ICD-10-CM | POA: Diagnosis not present

## 2018-06-12 DIAGNOSIS — I428 Other cardiomyopathies: Secondary | ICD-10-CM

## 2018-06-12 DIAGNOSIS — I10 Essential (primary) hypertension: Secondary | ICD-10-CM | POA: Diagnosis present

## 2018-06-12 HISTORY — DX: Encounter for therapeutic drug level monitoring: Z51.81

## 2018-06-12 HISTORY — DX: Other long term (current) drug therapy: Z79.899

## 2018-06-12 LAB — BASIC METABOLIC PANEL
Anion gap: 6 (ref 5–15)
BUN: 21 mg/dL (ref 8–23)
CALCIUM: 9.5 mg/dL (ref 8.9–10.3)
CO2: 25 mmol/L (ref 22–32)
CREATININE: 1.13 mg/dL (ref 0.61–1.24)
Chloride: 107 mmol/L (ref 98–111)
GFR calc non Af Amer: >60 mL/min (ref >60)
Glucose, Bld: 108 mg/dL — ABNORMAL HIGH (ref 70–99)
Potassium: 4 mmol/L (ref 3.5–5.1)
SODIUM: 138 mmol/L (ref 135–145)

## 2018-06-12 LAB — MAGNESIUM: Magnesium: 2.1 mg/dL (ref 1.7–2.4)

## 2018-06-12 MED ORDER — SODIUM CHLORIDE 0.9% FLUSH: 3.0000 mL | INTRAVENOUS | Status: DC | PRN

## 2018-06-12 MED ORDER — RIVAROXABAN 20 MG PO TABS
20.0000 mg | ORAL_TABLET | Freq: Every day | ORAL | Status: DC
  Administered 2018-06-12 – 2018-06-14 (×3): 20 mg via ORAL
  Filled 2018-06-12 (×3): qty 1

## 2018-06-12 MED ORDER — SODIUM CHLORIDE 0.9% FLUSH
3.0000 mL | Freq: Two times a day (BID) | INTRAVENOUS | Status: DC
  Administered 2018-06-12: 3 mL via INTRAVENOUS

## 2018-06-12 MED ORDER — SODIUM CHLORIDE 0.9 % IV SOLN
250.0000 mL | INTRAVENOUS | Status: DC
  Administered 2018-06-13: 11:00:00 via INTRAVENOUS

## 2018-06-12 MED ORDER — DOFETILIDE 500 MCG PO CAPS
500.0000 ug | ORAL_CAPSULE | Freq: Two times a day (BID) | ORAL | Status: DC
  Administered 2018-06-12: 500 ug via ORAL
  Filled 2018-06-12: qty 1

## 2018-06-12 MED ORDER — SODIUM CHLORIDE 0.9 % IV SOLN: 250.0000 mL | INTRAVENOUS | Status: DC | PRN

## 2018-06-12 MED ORDER — SODIUM CHLORIDE 0.9% FLUSH
3.0000 mL | Freq: Two times a day (BID) | INTRAVENOUS | Status: DC
  Administered 2018-06-13 – 2018-06-15 (×5): 3 mL via INTRAVENOUS

## 2018-06-12 MED ORDER — IRBESARTAN 150 MG PO TABS: 150.0000 mg | ORAL_TABLET | Freq: Every day | ORAL | Status: DC

## 2018-06-12 MED ORDER — METOPROLOL SUCCINATE ER 50 MG PO TB24
50.0000 mg | ORAL_TABLET | Freq: Every day | ORAL | Status: DC
  Administered 2018-06-13 – 2018-06-15 (×3): 50 mg via ORAL
  Filled 2018-06-12 (×3): qty 1

## 2018-06-12 MED ORDER — IRBESARTAN 150 MG PO TABS
150.0000 mg | ORAL_TABLET | Freq: Every day | ORAL | Status: DC
  Administered 2018-06-12 – 2018-06-14 (×3): 150 mg via ORAL
  Filled 2018-06-12 (×3): qty 1

## 2018-06-12 NOTE — Progress Notes (Signed)
Primary Care Physician: System, Pcp Not In Referring Physician: Dr. Rayann Heman Cardiologist: Dr. Mordecai Rasmussen is a 62 y.o. male with a h/o persistent afib since this past July, per pt,exact onset unknown,  that is in the afib clinic for admission for Tikosyn. No benadryl use, no missed anticoagulation. He is aware of the cost of the drug and can afford.Pt states that he was using alcohol but has cut way back on amount.  Today, he denies symptoms of palpitations, chest pain, shortness of breath, orthopnea, PND, lower extremity edema, dizziness, presyncope, syncope, or neurologic sequela. The patient is tolerating medications without difficulties and is otherwise without complaint today.   Past Medical History:  Diagnosis Date  . Hypertension   . Nonischemic cardiomyopathy ([place])   . Persistent atrial fibrillation    Past Surgical History:  Procedure Laterality Date  . CARDIOVERSION N/A 05/13/2018   Procedure: CARDIOVERSION;  Surgeon: [name], MD;  Location: [place];  Service: Cardiovascular;  Laterality: N/A;  . excision of actinic keratosis    . EYE SURGERY     eye lift  . TONSILLECTOMY      Current Outpatient Medications  Medication Sig Dispense Refill  . metoprolol succinate (TOPROL-XL) 50 MG 24 hr tablet Take 50 mg by mouth daily. Take with or immediately following a meal.    . rivaroxaban (XARELTO) 20 MG TABS tablet Take 20 mg by mouth daily with supper.    . valsartan (DIOVAN) 160 MG tablet Take 160 mg by mouth every evening.     No current facility-administered medications for this encounter.     No Known Allergies  Social History   Socioeconomic History  . Marital status: Divorced    Spouse name: Not on file  . Number of children: Not on file  . Years of education: Not on file  . Highest education level: Not on file  Occupational History  . Not on file  Social Needs  . Financial resource strain: Not on file  . Food insecurity:    Worry: Not on  file    Inability: Not on file  . Transportation needs:    Medical: Not on file    Non-medical: Not on file  Tobacco Use  . Smoking status: Never Smoker  . Smokeless tobacco: Never Used  Substance and Sexual Activity  . Alcohol use: Yes  . Drug use: No  . Sexual activity: Not on file    Comment: DIVORCE  Lifestyle  . Physical activity:    Days per week: Not on file    Minutes per session: Not on file  . Stress: Not on file  Relationships  . Social connections:    Talks on phone: Not on file    Gets together: Not on file    Attends religious service: Not on file    Active member of club or organization: Not on file    Attends meetings of clubs or organizations: Not on file    Relationship status: Not on file  . Intimate partner violence:    Fear of current or ex partner: Not on file    Emotionally abused: Not on file    Physically abused: Not on file    Forced sexual activity: Not on file  Other Topics Concern  . 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  Glaucoma Mother   . Atrial fibrillation Father     ROS- All systems are reviewed and negative except as per the HPI above  Physical Exam: Vitals:   06/12/18 0955  BP: 118/72  Pulse: 80  Weight: 77.6 kg  Height: 5' 7.5" (1.715 m)   Wt Readings from Last 3 Encounters:  06/12/18 77.6 kg  06/02/18 77.2 kg  05/13/18 74.4 kg    Labs: No results found for: NA, K, CL, CO2, GLUCOSE, BUN, CREATININE, CALCIUM, PHOS, MG No results found for: INR No results found for: CHOL, HDL, LDLCALC, TRIG   GEN- The patient is well appearing, alert and oriented x 3 today.   Head- normocephalic, atraumatic Eyes-  Sclera clear, conjunctiva pink Ears- hearing intact Oropharynx- clear Neck- supple, no JVP Lymph- no cervical lymphadenopathy Lungs- Clear to ausculation bilaterally, normal work of breathing Heart-  Regular rate and rhythm, no murmurs, rubs or gallops, PMI not laterally displaced GI- soft, NT, ND, + BS Extremities- no clubbing, cyanosis, or edema MS- no significant deformity or atrophy Skin- no rash or lesion Psych- euthymic mood, full affect Neuro- strength and sensation are intact  EKG-afib at 80 bpm, qrs int 84 ms, qtc 394 ms   Assessment and Plan: 1. Persistent  afib For admission  today for Tikosyn loading per Dr. Rayann Heman Aware of cost of drug No benadryl use No interruption in anticoagulation, continue xarelto for chadsvasc score of 2 (LVdysfunction,HTN) qtc acceptable at 394 ms No qtc prolonging drugs on board  To [place]. Dsean Vantol, [facility] [address] [place], [place] [address] [phone]

## 2018-06-12 NOTE — H&P (Addendum)
Cardiology Admission History and Physical:   Patient ID: [name] MRN: [id number]; DOB: September 21, 1955   Admission date: 06/12/2018  Primary Care Provider: System, Pcp Not In Primary Cardiologist: Dr. [name] Primary Electrophysiologist:  Dr. Rayann Heman  Chief Complaint:  Tikosyn initiation  Patient Profile:   Dustin Schneider is a 62 y.o. male with PMHx of HTN, NICM, and AFib.  History of Present Illness:   [name]  with hx of some years of short episodes of AF over the years, usually triggered by ETOH, per pt, though now with has had persistent afib since this past July, had DCCV with ERAF, on xarelto for a/c  He comes today for Tikosyn initiation, patient assures no missed Xarelto dose in 3 weeks, and longer.  He is aware of his irregular heart beat, quite "off-putting" sensation and bothersome to him.    He reports Dr. Rayann Heman and Butch Penny discussed potential benefits/risks of Tikosyn with no f/u questions, we discussed protocol and plan for DCCV tomorrow if needed, he is agreeable to proceed   Past Medical History:  Diagnosis Date  . Hypertension   . Nonischemic cardiomyopathy ([place])   . Persistent atrial fibrillation     Past Surgical History:  Procedure Laterality Date  . CARDIOVERSION N/A 05/13/2018   Procedure: CARDIOVERSION;  Surgeon: [name], MD;  Location: [place];  Service: Cardiovascular;  Laterality: N/A;  . excision of actinic keratosis    . EYE SURGERY     eye lift  . TONSILLECTOMY       Medications Prior to Admission: Prior to Admission medications   Medication Sig Start Date End Date Taking? Authorizing Provider  metoprolol succinate (TOPROL-XL) 50 MG 24 hr tablet Take 50 mg by mouth daily. Take with or immediately following a meal.    [provider]  rivaroxaban (XARELTO) 20 MG TABS tablet Take 20 mg by mouth daily with supper.    [provider]  valsartan (DIOVAN) 160 MG tablet Take 160 mg by mouth every evening.     [provider]     Allergies:   No Known Allergies  Social History:   Social History   Socioeconomic History  . Marital status: Divorced    Spouse name: Not on file  . Number of children: Not on file  . Years of education: Not on file  . Highest education level: Not on file  Occupational History  . Not on file  Social Needs  . Financial resource strain: Not on file  . Food insecurity:    Worry: Not on file    Inability: Not on file  . Transportation needs:    Medical: Not on file    Non-medical: Not on file  Tobacco Use  . Smoking status: Never Smoker  . Smokeless tobacco: Never Used  Substance and Sexual Activity  . Alcohol use: Yes  . Drug use: No  . Sexual activity: Not on file    Comment: DIVORCE  Lifestyle  . Physical activity:    Days per week: Not on file    Minutes per session: Not on file  . Stress: Not on file  Relationships  . Social connections:    Talks on phone: Not on file    Gets together: Not on file    Attends religious service: Not on file    Active member of club or organization: Not on file    Attends meetings of clubs or organizations: Not on file    Relationship status: Not on  file  . Intimate partner violence:    Fear of current or ex partner: Not on file    Emotionally abused: Not on file    Physically abused: Not on file    Forced sexual activity: Not on file  Other Topics Concern  . 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    ROS:  Please see the history of present illness.  All other ROS reviewed and negative.     Physical Exam/Data:   Vitals:   06/12/18 1100  BP: 125/78  Pulse: 75  Temp: 98.4 F (36.9 C)  TempSrc: Oral  SpO2: 98%   No intake or output data in the 24 hours ending 06/12/18 1119 There were no vitals filed for this  visit. There is no height or weight on file to calculate BMI.  General:  Well nourished, well developed, in no acute distress HEENT: normal Lymph: no adenopathy Neck: no JVD Endocrine:  No thryomegaly Vascular: No carotid bruits Cardiac:  irreg-irreg; no murmurs, gallops or rubs Lungs:  CTA b/l, no wheezing, rhonchi or rales  Abd: soft, nontender Ext: no edema Musculoskeletal:  No deformities Skin: warm and dry  Neuro:  no focal abnormalities noted Psych:  Normal affect    EKG:  The ECG that was done today was personally reviewed and demonstrates  AFib 80bpm, QTc 391ms  Relevant CV Studies:   Echocardiogram  Echo- 01-May-2018 1. Left ventricle cavity is normal in size. Mild decrease in global wall motion. Calculated EF 45%. 2. Mild to moderate aortic regurgitation. 3. Moderate (Grade III) mitral regurgitation. 4. Mild tricuspid regurgitation. PA pressure could not be calculated due to insignificant TR signal.  Laboratory Data:  Chemistry Recent Labs  Lab 06/12/18 1016  NA 138  K 4.0  CL 107  CO2 25  GLUCOSE 108*  BUN 21  CREATININE 1.13  CALCIUM 9.5  GFRNONAA >60  GFRAA >60  ANIONGAP 6    No results for input(s): PROT, ALBUMIN, AST, ALT, ALKPHOS, BILITOT in the last 168 hours. HematologyNo results for input(s): WBC, RBC, HGB, HCT, MCV, MCH, MCHC, RDW, PLT in the last 168 hours. Cardiac EnzymesNo results for input(s): TROPONINI in the last 168 hours. No results for input(s): TROPIPOC in the last 168 hours.  BNPNo results for input(s): BNP, PROBNP in the last 168 hours.  DDimer No results for input(s): DDIMER in the last 168 hours.  Radiology/Studies:  No results found.  Assessment and Plan:   1. Persistent AFib     CHA2DS2Vasc is 2, on Xarelto, appropriately dosed     Admitted for Tikosyn    K+ 4.0    Mag 2.1    Creat 1.13 (Calc CrCl is 72)    QTc is OK to start   2. NICM     Continue home meds     No exam findings to suggest fluid OL  3. HTN      Continue home meds   For questions or updates, please contact [place] Please consult [email or web address] for contact info under        Signed, Baldwin Jamaica, PA-C  06/12/2018 11:19 AM    I have seen, examined the patient, and reviewed the above assessment and plan.  Changes to above are made where necessary.  On exam,i RRR.  I agree with plans for tikosyn loading.  He has received once dose already.  Anticipate next dose tonight.  Will make NPO for possible cardioversion tomorrow afternoon if schedule allows.   Co Sign: Thompson Grayer, MD 06/12/2018 4:21 PM

## 2018-06-12 NOTE — Progress Notes (Signed)
Pharmacy Review for Dofetilide (Tikosyn) Initiation  Admit Complaint: 62 y.o. male admitted 06/12/2018 with atrial fibrillation to be initiated on dofetilide.   Assessment:  Patient Exclusion Criteria: If any screening criteria checked as "Yes", then  patient  should NOT receive dofetilide until criteria item is corrected. If "Yes" please indicate correction plan.  YES  NO Patient  Exclusion Criteria Correction Plan  []  [x]  Baseline QTc interval is greater than or equal to 440 msec. IF above YES box checked dofetilide contraindicated unless patient has ICD; then may proceed if QTc 500-550 msec or with known ventricular conduction abnormalities may proceed with QTc 550-600 msec. QTc =  394   []  [x]  Magnesium level is less than 1.8 mEq/l : Last magnesium:  Lab Results  Component Value Date   MG 2.1 06/12/2018         []  [x]  Potassium level is less than 4 mEq/l : Last potassium:  Lab Results  Component Value Date   K 4.0 06/12/2018         []  [x]  Patient is known or suspected to have a digoxin level greater than 2 ng/ml: No results found for: DIGOXIN    []  [x]  Creatinine clearance less than 20 ml/min (calculated using Cockcroft-Gault, actual body weight and serum creatinine): Estimated Creatinine Clearance: 62.9 mL/min (by C-G formula based on SCr of 1.13 mg/dL).    []  [x]  Patient has received drugs known to prolong the QT intervals within the last 48 hours (phenothiazines, tricyclics or tetracyclic antidepressants, erythromycin, H-1 antihistamines, cisapride, fluoroquinolones, azithromycin). Drugs not listed above may have an, as yet, undetected potential to prolong the QT interval, updated information on QT prolonging agents is available at this website:QT prolonging agents   []  [x]  Patient received a dose of hydrochlorothiazide (Oretic) alone or in any combination including triamterene (Dyazide, Maxzide) in the last 48 hours.   []  [x]  Patient received a medication known to increase  dofetilide plasma concentrations prior to initial dofetilide dose:  . Trimethoprim (Primsol, Proloprim) in the last 36 hours . Verapamil (Calan, Verelan) in the last 36 hours or a sustained release dose in the last 72 hours . Megestrol (Megace) in the last 5 days  . Cimetidine (Tagamet) in the last 6 hours . Ketoconazole (Nizoral) in the last 24 hours . Itraconazole (Sporanox) in the last 48 hours  . Prochlorperazine (Compazine) in the last 36 hours    []  [x]  Patient is known to have a history of torsades de pointes; congenital or acquired long QT syndromes.   []  [x]  Patient has received a Class 1 antiarrhythmic with less than 2 half-lives since last dose. (Disopyramide, Quinidine, Procainamide, Lidocaine, Mexiletine, Flecainide, Propafenone)   []  [x]  Patient has received amiodarone therapy in the past 3 months or amiodarone level is greater than 0.3 ng/ml.    Patient has been appropriately anticoagulated with Xarelto.  Ordering provider was confirmed at [email or web address] if they are not listed on the [place] Prescribers list.  Goal of Therapy: Follow renal function, electrolytes, potential drug interactions, and dose adjustment. Provide education and 1 week supply at discharge.  Plan:  [x]   Physician selected initial dose within range recommended for patients level of renal function - will monitor for response.  []   Physician selected initial dose outside of range recommended for patients level of renal function - will discuss if the dose should be altered at this time.   Select One Calculated CrCl  Dose q12h  [x]  > 60 ml/min 500 mcg  []   40-60 ml/min 250 mcg  []  20-40 ml/min 125 mcg   2. Follow up QTc after the first 5 doses, renal function, electrolytes (K & Mg) daily x 3     days, dose adjustment, success of initiation and facilitate 1 week discharge supply as     clinically indicated.  3. Initiate Tikosyn education video (Call [phone] and ask for Tikosyn Video #  [id number]).  4. Place Enrollment Form on the chart for discharge supply of dofetilide.  Elicia Lamp, PharmD, BCPS Clinical Pharmacist Clinical phone [phone] Please check AMION for all [place] contact numbers 06/12/2018 11:44 AM

## 2018-06-13 ENCOUNTER — Encounter (HOSPITAL_COMMUNITY): Payer: Self-pay

## 2018-06-13 ENCOUNTER — Inpatient Hospital Stay (HOSPITAL_COMMUNITY): Payer: BC Managed Care – PPO | Admitting: Anesthesiology

## 2018-06-13 ENCOUNTER — Encounter (HOSPITAL_COMMUNITY)
Admission: AD | Disposition: A | Discharge status: Home / Self Care | Payer: Self-pay | Source: Home / Self Care | Attending: Internal Medicine

## 2018-06-13 DIAGNOSIS — I428 Other cardiomyopathies: Secondary | ICD-10-CM

## 2018-06-13 DIAGNOSIS — I10 Essential (primary) hypertension: Secondary | ICD-10-CM

## 2018-06-13 HISTORY — PX: CARDIOVERSION: SHX1299

## 2018-06-13 LAB — BASIC METABOLIC PANEL
ANION GAP: 7 (ref 5–15)
BUN: 22 mg/dL (ref 8–23)
CALCIUM: 9.5 mg/dL (ref 8.9–10.3)
CO2: 28 mmol/L (ref 22–32)
Chloride: 102 mmol/L (ref 98–111)
Creatinine, Ser: 1.13 mg/dL (ref 0.61–1.24)
Glucose, Bld: 105 mg/dL — ABNORMAL HIGH (ref 70–99)
POTASSIUM: 4 mmol/L (ref 3.5–5.1)
Sodium: 137 mmol/L (ref 135–145)

## 2018-06-13 LAB — MAGNESIUM: Magnesium: 2.1 mg/dL (ref 1.7–2.4)

## 2018-06-13 SURGERY — CARDIOVERSION
Anesthesia: General

## 2018-06-13 MED ORDER — LIDOCAINE 2% (20 MG/ML) 5 ML SYRINGE
INTRAMUSCULAR | Status: DC | PRN
  Administered 2018-06-13: 40 mg via INTRAVENOUS

## 2018-06-13 MED ORDER — DOFETILIDE 500 MCG PO CAPS
500.0000 ug | ORAL_CAPSULE | Freq: Two times a day (BID) | ORAL | Status: DC
  Administered 2018-06-14 – 2018-06-15 (×3): 500 ug via ORAL
  Filled 2018-06-13: qty 14
  Filled 2018-06-13 (×3): qty 1

## 2018-06-13 MED ORDER — DOFETILIDE 500 MCG PO CAPS
500.0000 ug | ORAL_CAPSULE | Freq: Two times a day (BID) | ORAL | Status: AC
  Administered 2018-06-13 (×2): 500 ug via ORAL
  Filled 2018-06-13 (×2): qty 1

## 2018-06-13 MED ORDER — PROPOFOL 10 MG/ML IV BOLUS
INTRAVENOUS | Status: DC | PRN
  Administered 2018-06-13: 80 mg via INTRAVENOUS

## 2018-06-13 NOTE — Care Management (Signed)
WHO SPOKE WITH/ COMPANY/ PHONE NUMBER: [employer] [phone]  MEDICATION/ DOSE: Dofetilide twice daily 1. 125mg  2. 250mg  3. 500mg   AUTH REQUIRED? IF SO, PHONE NUMBER: auth required- name brand and generic  [phone]  COVERED?: Yes, with auth  CO-PAY FOR 30 DAY SUPPLY AT RETAIL PHARMACY: 1. $48.00 2. $48.00 3. $48.00  CO-PAY FOR 90 DAY SUPPLY AT MAIL-ORDER PHARMACY: 1. $100 2. $100 3. $100

## 2018-06-13 NOTE — Anesthesia Preprocedure Evaluation (Addendum)
Anesthesia Evaluation  Patient identified by MRN, date of birth, ID band Patient awake    Reviewed: Allergy & Precautions, NPO status , Patient's Chart, lab work & pertinent test results, reviewed documented beta blocker date and time   Airway Mallampati: II  TM Distance: >3 FB Neck ROM: Full    Dental no notable dental hx. (+) Teeth Intact, Dental Advisory Given   Pulmonary neg pulmonary ROS,    Pulmonary exam normal breath sounds clear to auscultation       Cardiovascular hypertension, Pt. on home beta blockers and Pt. on medications Normal cardiovascular exam+ dysrhythmias Atrial Fibrillation  Rhythm:Regular Rate:Normal     Neuro/Psych negative neurological ROS  negative psych ROS   GI/Hepatic negative GI ROS, Neg liver ROS,   Endo/Other  negative endocrine ROS  Renal/GU negative Renal ROS     Musculoskeletal negative musculoskeletal ROS (+)   Abdominal   Peds  Hematology negative hematology ROS (+)   Anesthesia Other Findings Day of surgery medications reviewed with the patient.  Reproductive/Obstetrics                            Anesthesia Physical Anesthesia Plan  ASA: II  Anesthesia Plan: General   Post-op Pain Management:    Induction:   PONV Risk Score and Plan: 2 and Treatment may vary due to age or medical condition  Airway Management Planned: Mask  Additional Equipment:   Intra-op Plan:   Post-operative Plan:   Informed Consent: I have reviewed the patients History and Physical, chart, labs and discussed the procedure including the risks, benefits and alternatives for the proposed anesthesia with the patient or authorized representative who has indicated his/her understanding and acceptance.   Dental advisory given  Plan Discussed with: CRNA  Anesthesia Plan Comments:         Anesthesia Quick Evaluation

## 2018-06-13 NOTE — Discharge Instructions (Signed)

## 2018-06-13 NOTE — Anesthesia Postprocedure Evaluation (Signed)
Anesthesia Post Note  Patient: Dustin Schneider  Procedure(s) Performed: CARDIOVERSION (N/A )     Patient location during evaluation: PACU Anesthesia Type: General Level of consciousness: awake and alert Pain management: pain level controlled Vital Signs Assessment: post-procedure vital signs reviewed and stable Respiratory status: spontaneous breathing, nonlabored ventilation and respiratory function stable Cardiovascular status: blood pressure returned to baseline and stable Postop Assessment: no apparent nausea or vomiting Anesthetic complications: no    Last Vitals:  Vitals:   06/13/18 1125 06/13/18 1130  BP: 116/85 114/87  Pulse: 66 (!) 56  Resp: 17 16  Temp: 36.9 C   SpO2: 96% 98%    Last Pain:  Vitals:   06/13/18 1130  TempSrc:   PainSc: 0-No pain                 Brennan Bailey

## 2018-06-13 NOTE — Transfer of Care (Signed)
Immediate Anesthesia Transfer of Care Note  Patient: Dustin Schneider  Procedure(s) Performed: CARDIOVERSION (N/A )  Patient Location: Endoscopy Unit  Anesthesia Type:General  Level of Consciousness: drowsy  Airway & Oxygen Therapy: Patient Spontanous Breathing  Post-op Assessment: Report given to RN and Post -op Vital signs reviewed and stable  Post vital signs: Reviewed and stable  Last Vitals:  Vitals Value Taken Time  BP    Temp    Pulse    Resp    SpO2      Last Pain:  Vitals:   06/13/18 1053  TempSrc: Oral  PainSc: 0-No pain         Complications: No apparent anesthesia complications

## 2018-06-13 NOTE — Care Management Note (Signed)
Case Management Note  Patient Details  Name: Dustin Schneider MRN: [id number] Date of Birth: 01-08-1956  Subjective/Objective:  Pt presented for Tikosyn Load. Benefits Check completed and per notes patient was aware of cost of Tikosyn.                   Action/Plan: CM can assist with Rx for 7 day supply no refills via [place] and patient will need an additional Rx with refills. No further needs from CM at this time.   Expected Discharge Date:                  Expected Discharge Plan:  Home/Self Care  In-House Referral:  NA  Discharge planning Services  CM Consult, Medication Assistance  Post Acute Care Choice:  NA Choice offered to:  NA  DME Arranged:  N/A DME Agency:  NA  HH Arranged:  NA HH Agency:  NA  Status of Service:  Completed, signed off  If discussed at [place] of Stay Meetings, dates discussed:    Additional Comments:  Bethena Roys, RN 06/13/2018, 3:30 PM

## 2018-06-13 NOTE — CV Procedure (Signed)
[place]: Anesthesia: Propofol  DCC x 1 120 J biphasic  converted from afib rate 80 to NSR rate 64   On Tikosyn and Xarelto with no missed doses  No immediate neurologic sequelae  Jenkins Rouge

## 2018-06-13 NOTE — Progress Notes (Signed)
   Progress Note   Subjective   Doing well today, the patient denies CP or SOB.  No new concerns  Inpatient Medications    Scheduled Meds: . dofetilide  500 mcg Oral BID  . irbesartan  150 mg Oral Daily  . metoprolol succinate  50 mg Oral Daily  . rivaroxaban  20 mg Oral Q supper  . sodium chloride flush  3 mL Intravenous Q12H  . sodium chloride flush  3 mL Intravenous Q12H   Continuous Infusions: . sodium chloride    . sodium chloride     PRN Meds: sodium chloride, sodium chloride flush, sodium chloride flush   Vital Signs    Vitals:   06/12/18 1100 06/12/18 1429 06/12/18 2000 06/13/18 0500  BP: 125/78 120/86 128/76 (!) 119/98  Pulse: 75 68 66 64  Resp:   18 18  Temp: 98.4 F (36.9 C) 98 F (36.7 C) 99 F (37.2 C) 97.7 F (36.5 C)  TempSrc: Oral Oral  Oral  SpO2: 98% 98% 96% 98%  Weight: 75.9 kg   74.9 kg  Height: 5' 7.5" (1.715 m)       Intake/Output Summary (Last 24 hours) at 06/13/2018 0814 Last data filed at 06/13/2018 0500 Gross per 24 hour  Intake 480 ml  Output 900 ml  Net -420 ml   Filed Weights   06/12/18 1100 06/13/18 0500  Weight: 75.9 kg 74.9 kg    Telemetry    afib - Personally Reviewed  Physical Exam   GEN- The patient is well appearing, alert and oriented x 3 today.   Head- normocephalic, atraumatic Eyes-  Sclera clear, conjunctiva pink Ears- hearing intact Oropharynx- clear Neck- supple, Lungs- Clear to ausculation bilaterally, normal work of breathing Heart- irregular rate and rhythm  GI- soft, NT, ND, + BS Extremities- no clubbing, cyanosis, or edema  MS- no significant deformity or atrophy Skin- no rash or lesion Psych- euthymic mood, full affect Neuro- strength and sensation are intact   Labs    Chemistry Recent Labs  Lab 06/12/18 1016 06/13/18 0401  NA 138 137  K 4.0 4.0  CL 107 102  CO2 25 28  GLUCOSE 108* 105*  BUN 21 22  CREATININE 1.13 1.13  CALCIUM 9.5 9.5  GFRNONAA >60 >60  GFRAA >60 >60  ANIONGAP  6 7     HematologyNo results for input(s): WBC, RBC, HGB, HCT, MCV, MCH, MCHC, RDW, PLT in the last 168 hours.  Cardiac EnzymesNo results for input(s): TROPONINI in the last 168 hours. No results for input(s): TROPIPOC in the last 168 hours.      Assessment & Plan    1.  Persistent afib Rate controlled Tolerating tikosyn,  Qt is stable after 2 doses of tikosyn Anticipate cardioversion today  2. Nonischemic CM Stable euvolemic No change required today  3. HTN Stable No change required today  Continue tikosyn load with cardioversion today  Anticipate discharge Sunday am  Will need follow-up in AF clinic in 1 week  Thompson Grayer MD, [facility] 06/13/2018 8:14 AM

## 2018-06-13 NOTE — Anesthesia Procedure Notes (Signed)
Procedure Name: General with mask airway Date/Time: 06/13/2018 11:19 AM Performed by: Imagene Riches, CRNA Pre-anesthesia Checklist: Emergency Drugs available, Patient identified, Suction available and Patient being monitored Patient Re-evaluated:Patient Re-evaluated prior to induction Oxygen Delivery Method: Ambu bag Preoxygenation: Pre-oxygenation with 100% oxygen

## 2018-06-14 DIAGNOSIS — Z5181 Encounter for therapeutic drug level monitoring: Secondary | ICD-10-CM

## 2018-06-14 DIAGNOSIS — Z79899 Other long term (current) drug therapy: Secondary | ICD-10-CM

## 2018-06-14 LAB — BASIC METABOLIC PANEL
ANION GAP: 5 (ref 5–15)
BUN: 15 mg/dL (ref 8–23)
CHLORIDE: 106 mmol/L (ref 98–111)
CO2: 28 mmol/L (ref 22–32)
Calcium: 8.9 mg/dL (ref 8.9–10.3)
Creatinine, Ser: 1.11 mg/dL (ref 0.61–1.24)
GFR calc non Af Amer: >60 mL/min (ref >60)
Glucose, Bld: 105 mg/dL — ABNORMAL HIGH (ref 70–99)
POTASSIUM: 4.2 mmol/L (ref 3.5–5.1)
SODIUM: 139 mmol/L (ref 135–145)

## 2018-06-14 LAB — MAGNESIUM: Magnesium: 2.2 mg/dL (ref 1.7–2.4)

## 2018-06-14 NOTE — Progress Notes (Signed)
   Progress Note   Subjective   Well today.  In sinus rhythm.  Inpatient Medications    Scheduled Meds: . dofetilide  500 mcg Oral BID  . irbesartan  150 mg Oral Daily  . metoprolol succinate  50 mg Oral Daily  . rivaroxaban  20 mg Oral Q supper  . sodium chloride flush  3 mL Intravenous Q12H  . sodium chloride flush  3 mL Intravenous Q12H   Continuous Infusions: . sodium chloride    . sodium chloride     PRN Meds: sodium chloride, sodium chloride flush, sodium chloride flush   Vital Signs    Vitals:   06/13/18 1130 06/13/18 1405 06/13/18 2137 06/14/18 0546  BP: 114/87 100/80 109/74 (!) 135/96  Pulse: (!) 56 63 60 (!) 54  Resp: 16     Temp:  97.7 F (36.5 C) 98.2 F (36.8 C) 97.7 F (36.5 C)  TempSrc:  Oral Oral Oral  SpO2: 98% 96% 97% 97%  Weight:    74.9 kg  Height:        Intake/Output Summary (Last 24 hours) at 06/14/2018 0838 Last data filed at 06/14/2018 0547 Gross per 24 hour  Intake 1110 ml  Output 1575 ml  Net -465 ml   Filed Weights   06/12/18 1100 06/13/18 0500 06/14/18 0546  Weight: 75.9 kg 74.9 kg 74.9 kg    Telemetry    Sinus rhythm personally reviewed  Physical Exam   GEN: Well nourished, well developed, in no acute distress  HEENT: normal  Neck: no JVD, carotid bruits, or masses Cardiac: RRR; no murmurs, rubs, or gallops,no edema  Respiratory:  clear to auscultation bilaterally, normal work of breathing GI: soft, nontender, nondistended, + BS MS: no deformity or atrophy  Skin: warm and dry Neuro:  Strength and sensation are intact Psych: euthymic mood, full affect   Labs    Chemistry Recent Labs  Lab 06/12/18 1016 06/13/18 0401  NA 138 137  K 4.0 4.0  CL 107 102  CO2 25 28  GLUCOSE 108* 105*  BUN 21 22  CREATININE 1.13 1.13  CALCIUM 9.5 9.5  GFRNONAA >60 >60  GFRAA >60 >60  ANIONGAP 6 7     HematologyNo results for input(s): WBC, RBC, HGB, HCT, MCV, MCH, MCHC, RDW, PLT in the last 168 hours.  Cardiac  EnzymesNo results for input(s): TROPONINI in the last 168 hours. No results for input(s): TROPIPOC in the last 168 hours.      Assessment & Plan    1.  Persistent afib Cardioversion yesterday to sinus rhythm.  Does have multiple APCs.  Is asymptomatic.  Continue Tigas and.  2. Nonischemic CM Stable and euvolemic.  3. HTN  well-controlled.  No changes.  Adjoa Althouse [place], [place] 06/14/2018 8:38 AM

## 2018-06-15 DIAGNOSIS — I428 Other cardiomyopathies: Secondary | ICD-10-CM

## 2018-06-15 DIAGNOSIS — I10 Essential (primary) hypertension: Secondary | ICD-10-CM

## 2018-06-15 DIAGNOSIS — I480 Paroxysmal atrial fibrillation: Secondary | ICD-10-CM

## 2018-06-15 HISTORY — DX: Paroxysmal atrial fibrillation: I48.0

## 2018-06-15 HISTORY — DX: Other cardiomyopathies: I42.8

## 2018-06-15 LAB — BASIC METABOLIC PANEL
Anion gap: 6 (ref 5–15)
BUN: 16 mg/dL (ref 8–23)
CHLORIDE: 106 mmol/L (ref 98–111)
CO2: 26 mmol/L (ref 22–32)
CREATININE: 1.14 mg/dL (ref 0.61–1.24)
Calcium: 8.8 mg/dL — ABNORMAL LOW (ref 8.9–10.3)
GFR calc Af Amer: >60 mL/min (ref >60)
GFR calc non Af Amer: >60 mL/min (ref >60)
GLUCOSE: 101 mg/dL — AB (ref 70–99)
POTASSIUM: 3.8 mmol/L (ref 3.5–5.1)
Sodium: 138 mmol/L (ref 135–145)

## 2018-06-15 LAB — MAGNESIUM: Magnesium: 2.2 mg/dL (ref 1.7–2.4)

## 2018-06-15 MED ORDER — POTASSIUM CHLORIDE CRYS ER 20 MEQ PO TBCR
20.0000 meq | EXTENDED_RELEASE_TABLET | Freq: Once | ORAL | Status: AC
  Administered 2018-06-15: 20 meq via ORAL
  Filled 2018-06-15: qty 1

## 2018-06-15 MED ORDER — METOPROLOL SUCCINATE ER 50 MG PO TB24: 50.0000 mg | ORAL_TABLET | Freq: Every day | ORAL | 3 refills | Status: DC

## 2018-06-15 MED ORDER — RIVAROXABAN 20 MG PO TABS: 20.0000 mg | ORAL_TABLET | Freq: Every day | ORAL | 3 refills | Status: DC

## 2018-06-15 MED ORDER — VALSARTAN 160 MG PO TABS: 160.0000 mg | ORAL_TABLET | Freq: Every evening | ORAL | 3 refills | Status: DC

## 2018-06-15 MED ORDER — DOFETILIDE 500 MCG PO CAPS: 500.0000 ug | ORAL_CAPSULE | Freq: Two times a day (BID) | ORAL | 0 refills | Status: DC

## 2018-06-15 NOTE — Progress Notes (Signed)
Paged by nurse for K 3.8. Will give 90meq now, MD to decide in rounds if standing repletion necessary. Dayna Dunn PA-C

## 2018-06-15 NOTE — Discharge Summary (Addendum)
Discharge Summary    Patient ID: Dustin Schneider,  MRN: [id number], DOB/AGE: [birth year] 62 y.o.  Admit date: 06/12/2018 Discharge date: 06/15/2018  Primary Care Provider: System, Pcp Not In Primary Cardiologist: Dr. [name] Primary Electrophysiologist:  Thompson Grayer, MD  Discharge Diagnoses    Principal Problem:   Persistent atrial fibrillation Active Problems:   Visit for monitoring Tikosyn therapy   Essential hypertension   NICM (nonischemic cardiomyopathy) [facility])    Diagnostic Studies/Procedures    DCCV as below _____________     History of Present Illness     Dustin Schneider is a 62 y.o. male with history of persistent atrial fib, NICM, HTN who was admitted for planned Tikosyn admission. He previously had short episodes of AF over the years, usually triggered by ETOH, though more recently developed persistent afib since this past July. He underwent DCCV with ERAF. He has been symptomatic with his atrial fib with off-putting sensation and bothersome feeling. Dr. Rayann Heman and Butch Penny discussed potential benefits/risks of Tikosyn and he was brought in for initiation.   Hospital Course    1.  Persistent afib -  lytes were OK, QTC acceptable to begin per EP team. He reported compliance with anticoagulation. He underwent DCCV with successful conversion to NSR on 11/1. He was noted to have multiple PACs, but remained in NSR. QTc remained stable. EP team arranged f/u in AFib clinic 11/11 and f/u with Dr. Rayann Heman in 07/2018.   2. Nonischemic CM - Echo listed in H/P from 04/23/18 with EF 45% with moderate MR, mild-moderate AI. He remained stable and euvolemic during admission.  3. HTN - BP was elevated today. Per Dr [name] would allow titration of medications as an outpatient. Ibuprofen listed on med list - advised to limit use given chronic anticoagulation.  4.  Relative hypokalemia - K was 3.8 this AM but usually >4. It was repleted Per Dr. [name] would hold off on starting  potassium as an outpatient until outpatient labs are performed at time of afib clinic f/u.  7 day Tikosyn rx from pharmacy was provided at time of DC and 30 day fill was sent to patient's pharmacy on file. If tolerating Tikosyn at follow-up afib clinic visit, further refills Mccartney Chuba need to be sent in. Dr. [name] has seen and examined the patient today and feels he is stable for discharge.  _____________  Discharge Vitals Blood pressure (!) 152/96, pulse 60, temperature 97.8 F (36.6 C), temperature source Oral, resp. rate 16, height 5' 7.5" (1.715 m), weight 75.1 kg, SpO2 99 %.  Filed Weights   06/13/18 0500 06/14/18 0546 06/15/18 0524  Weight: 74.9 kg 74.9 kg 75.1 kg    Labs & Radiologic Studies    Basic Metabolic Panel Recent Labs    06/14/18 0515 06/15/18 0355  NA 139 138  K 4.2 3.8  CL 106 106  CO2 28 26  GLUCOSE 105* 101*  BUN 15 16  CREATININE 1.11 1.14  CALCIUM 8.9 8.8*  MG 2.2 2.2   _____________  No results found. Disposition   Pt is being discharged home today in good condition.  Follow-up Plans & Appointments    Follow-up Information    [facility] Follow up.   Specialty:  Cardiology Why:  06/23/18 @ 9:00AM Contact information: [address] [id number] [place] [address] [phone]       Thompson Grayer, MD Follow up.   Specialty:  Cardiology Why:  07/14/18 @ 9:15AM Contact information: [address] [place] [address] [phone]          Discharge Instructions    Diet - low sodium heart healthy   Complete by:  As directed    Discharge instructions   Complete by:  As directed    Remember - if you are ever prescribed ANY new medication by any other provider, you must make sure to have them check it does not interact with your Tikosyn. Do not start any new over-the-counter meds or supplements without speaking to your cardiology team.   Patients taking blood thinners should  generally limit medicines like ibuprofen, Advil, Motrin, naproxen, and Aleve due to risk of stomach bleeding. You may take Tylenol as directed or talk to your primary doctor about alternatives.   Increase activity slowly   Complete by:  As directed       Discharge Medications   Allergies as of 06/15/2018   No Known Allergies     Medication List    STOP taking these medications   ibuprofen 200 MG tablet Commonly known as:  ADVIL,MOTRIN     TAKE these medications   dofetilide 500 MCG capsule Commonly known as:  TIKOSYN Take 1 capsule (500 mcg total) by mouth 2 (two) times daily.   metoprolol succinate 50 MG 24 hr tablet Commonly known as:  TOPROL-XL Take 50 mg by mouth daily. Take with or immediately following a meal.   rivaroxaban 20 MG Tabs tablet Commonly known as:  XARELTO Take 20 mg by mouth daily with supper.   valsartan 160 MG tablet Commonly known as:  DIOVAN Take 160 mg by mouth every evening.        Allergies:  No Known Allergies     Outstanding Labs/Studies   Pending f/u AFib clinic  Duration of Discharge Encounter   Greater than 30 minutes including physician time.  Signed, Nedra Hai Dunn PA-C 06/15/2018, 1:48 PM    I have seen and examined this patient with Melina Copa.  Agree with above, note added to reflect my findings.  On exam, RRR, no murmurs, lungs clear. Patient admitted for tikosyn load. Sinus rhythm with stable QTc. Plan for discharge with follow up in AF clinic.    Demaris Bousquet M. Mariely Mahr MD 06/15/2018 1:52 PM

## 2018-06-15 NOTE — Progress Notes (Signed)
   Progress Note   Subjective   Sinus rhythm, feeling well today  Inpatient Medications    Scheduled Meds: . dofetilide  500 mcg Oral BID  . irbesartan  150 mg Oral Daily  . metoprolol succinate  50 mg Oral Daily  . rivaroxaban  20 mg Oral Q supper  . sodium chloride flush  3 mL Intravenous Q12H  . sodium chloride flush  3 mL Intravenous Q12H   Continuous Infusions: . sodium chloride    . sodium chloride     PRN Meds: sodium chloride, sodium chloride flush, sodium chloride flush   Vital Signs    Vitals:   06/14/18 1542 06/14/18 2135 06/15/18 0524 06/15/18 0812  BP: 130/83 (!) 148/96 (!) 148/100 (!) 152/96  Pulse: 65 (!) 52 (!) 48 60  Resp:   16   Temp: 98.7 F (37.1 C) 98.1 F (36.7 C) 97.8 F (36.6 C)   TempSrc: Oral Oral Oral   SpO2: 96% 95% 99%   Weight:   75.1 kg   Height:        Intake/Output Summary (Last 24 hours) at 06/15/2018 1005 Last data filed at 06/14/2018 2017 Gross per 24 hour  Intake 3 ml  Output 825 ml  Net -822 ml   Filed Weights   06/13/18 0500 06/14/18 0546 06/15/18 0524  Weight: 74.9 kg 74.9 kg 75.1 kg    Telemetry    SR personally reviewed  Physical Exam   GEN: Well nourished, well developed, in no acute distress  HEENT: normal  Neck: no JVD, carotid bruits, or masses Cardiac: RRR; no murmurs, rubs, or gallops,no edema  Respiratory:  clear to auscultation bilaterally, normal work of breathing GI: soft, nontender, nondistended, + BS MS: no deformity or atrophy  Skin: warm and dry Neuro:  Strength and sensation are intact Psych: euthymic mood, full affect    Labs    Chemistry Recent Labs  Lab 06/13/18 0401 06/14/18 0515 06/15/18 0355  NA 137 139 138  K 4.0 4.2 3.8  CL 102 106 106  CO2 28 28 26   GLUCOSE 105* 105* 101*  BUN 22 15 16   CREATININE 1.13 1.11 1.14  CALCIUM 9.5 8.9 8.8*  GFRNONAA >60 >60 >60  GFRAA >60 >60 >60  ANIONGAP 7 5 6      HematologyNo results for input(s): WBC, RBC, HGB, HCT, MCV, MCH,  MCHC, RDW, PLT in the last 168 hours.  Cardiac EnzymesNo results for input(s): TROPONINI in the last 168 hours. No results for input(s): TROPIPOC in the last 168 hours.      Assessment & Plan    1.  Persistent afib Is post cardioversion and remains in sinus rhythm.  Multiple PACs.  Asymptomatic.  Continue dofetilide.  Should his QTC remains stable, would plan for discharge after his EKG today.  2. Nonischemic CM Stable and euvolemic  3. HTN  elevated today.  Dustin Schneider allow titration of medications as an outpatient.  4.  Hypokalemia: Has been repleted.  At this point would hold off on starting potassium as an outpatient until outpatient labs are performed.  Dustin Schneider Dustin Schneider, Dustin Schneider 06/15/2018 10:05 AM

## 2018-06-15 NOTE — Progress Notes (Signed)
Pt requested refills of Toprol, Xarelto, ARB. Sent in. Myna Freimark PA-C

## 2018-06-15 NOTE — Progress Notes (Signed)
Patient d/ced with 7 day supply Tikosyn.  Reviewed all d/c instructions, verbalized understanding.  IV d/ced without problem.

## 2018-06-16 ENCOUNTER — Encounter (HOSPITAL_COMMUNITY): Payer: Self-pay | Admitting: Cardiovascular Disease

## 2018-06-16 ENCOUNTER — Telehealth: Payer: Self-pay

## 2018-06-16 NOTE — Telephone Encounter (Signed)
P/A started in Cover My Meds for Dofetilide 575mcg BID... Key: [place]: [id number]

## 2018-06-17 NOTE — Telephone Encounter (Signed)
**Note De-Identified  Obfuscation** Letter received  fax from [place] stating that they have approved the pts Dofetilide PA. Approval good from 06/16/18 until 06/17/19  I have notified the pts pharmacy.

## 2018-06-22 NOTE — Anesthesia Preprocedure Evaluation (Addendum)
Anesthesia Evaluation  Patient identified by MRN, date of birth, ID band Patient awake    Reviewed: Allergy & Precautions, NPO status , Patient's Chart, lab work & pertinent test results  Airway Mallampati: I  TM Distance: >3 FB Neck ROM: Full    Dental no notable dental hx. (+) Teeth Intact, Dental Advisory Given   Pulmonary neg pulmonary ROS,    Pulmonary exam normal breath sounds clear to auscultation       Cardiovascular hypertension, Pt. on home beta blockers and Pt. on medications Normal cardiovascular exam+ dysrhythmias Atrial Fibrillation  Rhythm:Regular Rate:Normal  Echo- 04/23/2018 1. Left ventricle cavity is normal in size. Mild decrease in global wall motion. Calculated EF 45%. 2. Mild to moderate aortic regurgitation. 3. Moderate (Grade III) mitral regurgitation. 4. Mild tricuspid regurgitation.   Nuclear stress test  05/05/2018: 1. The resting electrocardiogram demonstrated atrial fibrillation. The stress electrocardiogram was positive for ischemia with 2 mm downsloping ST depression in the inferior and lateral leads, persisted for 2 mintues into recovery. Stress symptoms included palpitations and fatigue.  2. Left ventricular cavity is noted to be normal on the rest and stress studies. The left ventricular ejection fraction was calculated to be 38%, but visually appears to be at least low normal without wall motion abnormality. This is an intermediate risk study due to abnormal ST change and low LVEF    Neuro/Psych negative neurological ROS  negative psych ROS   GI/Hepatic negative GI ROS, Neg liver ROS,   Endo/Other  negative endocrine ROS  Renal/GU negative Renal ROS  negative genitourinary   Musculoskeletal negative musculoskeletal ROS (+)   Abdominal   Peds  Hematology negative hematology ROS (+)   Anesthesia Other Findings On tikosyn and xarelto  Reproductive/Obstetrics                             Anesthesia Physical Anesthesia Plan  ASA: III  Anesthesia Plan: General   Post-op Pain Management:    Induction: Intravenous  PONV Risk Score and Plan: 2 and Propofol infusion and Treatment may vary due to age or medical condition  Airway Management Planned: Natural Airway and Simple Face Mask  Additional Equipment:   Intra-op Plan:   Post-operative Plan:   Informed Consent: I have reviewed the patients History and Physical, chart, labs and discussed the procedure including the risks, benefits and alternatives for the proposed anesthesia with the patient or authorized representative who has indicated his/her understanding and acceptance.   Dental advisory given  Plan Discussed with: CRNA  Anesthesia Plan Comments:         Anesthesia Quick Evaluation

## 2018-06-22 NOTE — H&P (Signed)
OFFICE VISIT NOTES COPIED TO EPIC FOR DOCUMENTATION  . History of Present Illness Laverda Page MD; 06/20/2018 2:10 PM) Patient words: Acute vsit EKG; Last Ov 06/19/18.  The patient is a 62 year old male who presents for a Follow-up for Atrial fibrillation. [name] is a Caucasian male with newly noted atrial fibrillation with RVR. He has hypertension adn denies symptoms to suggest sleep apnea.  Echocardiogram on 04/23/2018 with mild decrease in global wall motion and LVEF of 45%. Moderate MR. He then underwent exercise stress testing on 05/05/2018 that did not reveal any perfusion abnormalities, but did have decreased LVEF and was called intermediate study in view of this.  He underwent successful cardioversion on 05/13/2018; however, later that same day patient reports that he went back into A fib. He was admitted to the hospital on 06/12/2018 by Dr. Thompson Grayer and underwent Tikosyn administration and converted to sinus rhythm by direct current cardioversion. He was seen by me yestarday and he had felt great. Last night he started to feel tired and had chest pain similar to his prior symptoms and came in to be checked for recurrent A. Fib. He does not feel well.   Problem List/Past Medical Sherre Scarlet Joya Gaskins; 06/20/2018 1:36 PM) Laboratory examination (Z01.89)  05/07/2018: Glucose 106, creatinine 1.18, EGFR 65/75, sodium 145, potassium 4.2, BMP otherwise normal. 05/02/2018: Hemoglobin A1c 5.8%. 04/03/2018: Creatinine 1.09, EGFR 71/82, potassium 4.6, BMP normal. CBC normal. TSH 3.14. Cholesterol 184, triglycerides 116, HDL 46, LDL 115. 03/20/2018: Creatinine 1.13, EGFR 69/80, potassium 4.4, ALT 49, CMP otherwise normal. 06/07/2006: Creatinine 1.0, Potassium 5.4, CMP normal. Cholesterol 247, Triglycerides 103, HDL 78, LDL 160. CBC normal. Paroxysmal atrial fibrillation (I48.0)  EKG 06/19/2018: Normal sinus rhythm at the rate of 60 bpm, normal axis, poor R-wave progression, cannot exclude  anteroseptal infarct old. Nonspecific T abnormality. Normal QT interval. PACs (2). EKG 05/22/2018: Atrial fibrillation with controlled ventricular response 60 bpm. Normal axis. No connection. Nonspecific inferolateral ST-T changes. Direct current cardioversion 05/13/2018: 120J x 1, A. Fib to NSR. Essential hypertension (I10)  Abnormal EKG (R94.31)  Exercise sestamibi stress test 05/05/2018: 1. The resting electrocardiogram demonstrated atrial fibrillation. The stress electrocardiogram was positive for ischemia with 2 mm downsloping ST depression in the inferior and lateral leads, persisted for 2 mintues into recovery. Stress symptoms included palpitations and fatigue. Patient exercised on Bruce protocol for 6:00 minutes and achieved 7.05 METS. Stress test terminated due to fatigue and 113% MPHR achieved (Target HR >85%). 2. Left ventricular cavity is noted to be normal on the rest and stress studies. SPECT images demonstrate homogeneous tracer distribution throughout the myocardium. The left ventricular ejection fraction was calculated to be 38%, but visually appears to be at least low normal without wall motion abnormality. This is an intermediate risk study due to abnormal ST change and low LVEF (difficult in view of gating with A. Fib), clinical correlation recommended. Hyperglycemia (R73.9)  Moderate mitral regurgitation (I34.0)  History of cardioversion (C14.481)  Direct current cardioversion 05/13/2018: 120J x 1, A. Fib to NSR. Therapeutic drug monitoring (Z51.81)  On Tikosyn 06/12/2018  Allergies Westley Chandler; 06/20/2018 1:36 PM) No Known Drug Allergies [03/19/2018]:  Family History Westley Chandler; 06/20/2018 1:36 PM) Mother  In good health. glaucoma, no heart attacks or strokes, no known cardiovascular conditions Father  In stable health. no heart attacks or strokes, no known cardiovascular conditions  Social History Westley Chandler; 06/20/2018 1:36 PM) Current tobacco use  Smokes  cigars. rarely 1-2 times per year Alcohol Use  Occasional alcohol use. 3-4 beers a week Marital status  Divorced. Living Situation  Lives with relatives. son lives with pt Number of Children  1.  Past Surgical History Westley Chandler; 06/20/2018 1:36 PM) Cyst Removal  rt hand 5 yrs ago, from back 10 yrs ago cardioversion 10-19   Medication History Sherre Scarlet Joya Gaskins; 06/20/2018 1:38 PM) Metoprolol Succinate ER (50MG Tablet ER 24HR, 1 (one) Tablet Oral daily, Taken starting 06/19/2018) Active. Xarelto (20MG Tablet, 1 (one) Tablet Oral every evening after dinner, Taken starting 06/19/2018) Active. Valsartan (160MG Tablet, 1 (one) Tablet Oral daily, Taken starting 06/19/2018) Active. Magnesium 27 (500 (27 Mg)MG Tablet, 1 (one) Tablet Oral daily, Taken starting 06/19/2018) Active. (OTC and not prescribed) Dofetilide (500MCG Capsule, 1 capsule Oral twice a day, Taken starting 06/12/2018) Active: A. Fib. Tikosyn (500MCG Capsule, 1 Oral two times daily) Active. Medications Reconciled (verbally with pt)  Diagnostic Studies History Westley Chandler; 06/20/2018 1:36 PM) Echocardiogram  Echo- 04/23/2018 1. Left ventricle cavity is normal in size. Mild decrease in global wall motion. Calculated EF 45%. 2. Mild to moderate aortic regurgitation. 3. Moderate (Grade III) mitral regurgitation. 4. Mild tricuspid regurgitation. PA pressure could not be calculated due to insignificant TR signal. Nuclear stress test  05/05/2018: 1. The resting electrocardiogram demonstrated atrial fibrillation. The stress electrocardiogram was positive for ischemia with 2 mm downsloping ST depression in the inferior and lateral leads, persisted for 2 mintues into recovery. Stress symptoms included palpitations and fatigue. Patient exercised on Bruce protocol for 6:00 minutes and achieved 7.05 METS. Stress test terminated due to fatigue and 113% MPHR achieved (Target HR >85%). 2. Left ventricular cavity is noted to be normal  on the rest and stress studies. SPECT images demonstrate homogeneous tracer distribution throughout the myocardium. The left ventricular ejection fraction was calculated to be 38%, but visually appears to be at least low normal without wall motion abnormality. This is an intermediate risk study due to abnormal ST change and low LVEF (difficult in view of gating with A. Fib), clinical correlation recommended. Cardioversion [05/13/2018]:    Review of Systems Laverda Page MD; 06/20/2018 2:10 PM) General Present- Tiredness. Not Present- Appetite Loss and Weight Gain. Respiratory Not Present- Chronic Cough and Wakes up from Sleep Wheezing or Short of Breath. Cardiovascular Present- Irregular Heart Beat. Not Present- Chest Pain, Claudications, Difficulty Breathing On Exertion and Orthopnea. Gastrointestinal Not Present- Black, Tarry Stool, Bloody Stool, Difficulty Swallowing and Hematemesis. Musculoskeletal Not Present- Decreased Range of Motion and Muscle Atrophy. Neurological Not Present- Attention Deficit. Psychiatric Not Present- Personality Changes and Suicidal Ideation. Endocrine Not Present- Cold Intolerance and Heat Intolerance. Hematology Not Present- Abnormal Bleeding. All other systems negative  Vitals Westley Chandler; 06/20/2018 1:34 PM) 06/20/2018 1:34 PM Weight: 167 lb Height: 67in Body Surface Area: 1.87 m Body Mass Index: 26.16 kg/m  Pulse: 50 (Regular)  P.OX: 95% (Room air) BP: 116/92 (Sitting, Left Arm, Standard)  Physical Exam Laverda Page MD; 06/20/2018 2:09 PM) General Mental Status-Alert. General Appearance-Cooperative and Appears stated age. Build & Nutrition-Well built and Well nourished.  Head and Neck Thyroid Gland Characteristics - normal size and consistency and no palpable nodules.  Chest and Lung Exam Chest and lung exam reveals -quiet, even and easy respiratory effort with no use of accessory muscles, non-tender and on  auscultation, normal breath sounds, no adventitious sounds.  Cardiovascular Cardiovascular examination reveals -carotid auscultation reveals no bruits, abdominal aorta auscultation reveals no bruits and no prominent pulsation, femoral artery auscultation bilaterally reveals normal pulses, no bruits,  no thrills and normal pedal pulses bilaterally. Auscultation Rhythm - Irregular. Heart Sounds - S1 is variable, S2 WNL and No gallop present. Murmurs & Other Heart Sounds - Murmur - No murmur.  Abdomen Palpation/Percussion Normal exam - Non Tender and No hepatosplenomegaly.  Peripheral Vascular Lower Extremity Palpation - Edema - Bilateral - No edema. Carotid arteries - Bilateral-No Carotid bruit.  Neurologic Neurologic evaluation reveals -alert and oriented x 3 with no impairment of recent or remote memory. Motor-Grossly intact without any focal deficits.  Musculoskeletal Global Assessment Left Lower Extremity - no deformities, masses or tenderness, no known fractures. Right Lower Extremity - no deformities, masses or tenderness, no known fractures.   Assessment & Plan Laverda Page MD; 06/20/2018 2:09 PM) Paroxysmal atrial fibrillation (I48.0) Story: EKG 06/19/2018: Normal sinus rhythm at the rate of 60 bpm, normal axis, poor R-wave progression, cannot exclude anteroseptal infarct old. Nonspecific T abnormality. Normal QT interval. PACs (2).  EKG 05/22/2018: Atrial fibrillation with controlled ventricular response 60 bpm. Normal axis. No connection. Nonspecific inferolateral ST-T changes.  Direct current cardioversion 05/13/2018: 120J x 1, A. Fib to NSR. Impression: EP consult with Dr. Thompson Grayer 06/02/2018: On Tikosyn 06/12/2018  CHA2DS2-VASc Score is 2 with yearly risk of stroke of 2%. (CHF; HTN; vasc disease DM, Male = 1; Age <65 =1;  65-74 = 2, >75 =3; stroke = 3). Future Plans 09/08/2018: MAGNESIUM [phone]) - one time History of cardioversion (I34.742) Story:  Direct current cardioversion 05/13/2018: 120J x 1, A. Fib to NSR. Essential hypertension (I10) Future Plans 5/95/6387: METABOLIC PANEL, COMPREHENSIVE (56433) - one time Moderate mitral regurgitation (I34.0) Therapeutic drug monitoring (Z51.81) Story: On Tikosyn 06/12/2018 Laboratory examination (I95.18) Story: 05/07/2018: Glucose 106, creatinine 1.18, EGFR 65/75, sodium 145, potassium 4.2, BMP otherwise normal.  05/02/2018: Hemoglobin A1c 5.8%.  04/03/2018: Creatinine 1.09, EGFR 71/82, potassium 4.6, BMP normal. CBC normal. TSH 3.14. Cholesterol 184, triglycerides 116, HDL 46, LDL 115.  03/20/2018: Creatinine 1.13, EGFR 69/80, potassium 4.4, ALT 49, CMP otherwise normal.  06/07/2006: Creatinine 1.0, Potassium 5.4, CMP normal. Cholesterol 247, Triglycerides 103, HDL 78, LDL 160. CBC normal.  Note:-  Assessment/ Recommendations:  Mr. Outman is a Caucasian male with newly noted atrial fibrillation with RVR. He has hypertension adn denies symptoms to suggest sleep apnea.  Echocardiogram on 04/23/2018 with mild decrease in global wall motion and LVEF of 45%. Moderate MR. He then underwent exercise stress testing on 05/05/2018 that did not reveal any perfusion abnormalities, but did have decreased LVEF and was called intermediate study in view of this.  He underwent successful cardioversion on 05/13/2018; however, later that same day patient reports that he went back into A fib. He was admitted to the hospital on 06/12/2018 by Dr. Thompson Grayer and underwent Tikosyn administration and converted to sinus rhythm by direct current cardioversion. He was seen by me yestarday and he had felt great. Last night he started to feel tired and had chest pain similar to his prior symptoms and came in to be checked for recurrent A. Fib. He does not feel well.  He was initially referred for sleep study, but patient denies any symptoms to suggest sleep apnea. He does not want to go through with this. I have  advised him to keep up to sleep study in view of recurrence of atrial fibrillation. I'll set him up for direct current cardio portion if he is still in atrial fibrillation my Monday morning. I discussed with Dr. Thompson Grayer, if he continues to fail Tikosyn, then  we'll consider atrial fibrillation ablation.   CC: Antony Contras, MD, Thompson Grayer, MD (EP) Addendum Sunday Spillers MD; 06/22/2018 3:27 AM) EKG 06/20/2018: Atrial fibrillation with controlled ventricular response at the rate of 80 bpm, normal axis, diffuse nonspecific ST-T wave abnormality, cannot exclude inferior and lateral ischemia.   Signed by Laverda Page, MD (06/20/2018 2:10 PM)

## 2018-06-23 ENCOUNTER — Encounter (HOSPITAL_COMMUNITY): Payer: Self-pay | Admitting: *Deleted

## 2018-06-23 ENCOUNTER — Ambulatory Visit (HOSPITAL_COMMUNITY): Payer: BC Managed Care – PPO | Admitting: Anesthesiology

## 2018-06-23 ENCOUNTER — Encounter (HOSPITAL_COMMUNITY)
Admission: RE | Disposition: A | Discharge status: Home / Self Care | Payer: Self-pay | Source: Ambulatory Visit | Attending: Cardiology

## 2018-06-23 ENCOUNTER — Ambulatory Visit (HOSPITAL_COMMUNITY): Payer: BC Managed Care – PPO | Admitting: Nurse Practitioner

## 2018-06-23 ENCOUNTER — Ambulatory Visit (HOSPITAL_COMMUNITY)
Admission: RE | Admit: 2018-06-23 | Discharge: 2018-06-23 | Disposition: A | Discharge status: Home / Self Care | Payer: BC Managed Care – PPO | Source: Ambulatory Visit | Attending: Cardiology | Admitting: Cardiology

## 2018-06-23 DIAGNOSIS — F17290 Nicotine dependence, other tobacco product, uncomplicated: Secondary | ICD-10-CM | POA: Insufficient documentation

## 2018-06-23 DIAGNOSIS — I4891 Unspecified atrial fibrillation: Secondary | ICD-10-CM | POA: Diagnosis present

## 2018-06-23 DIAGNOSIS — I480 Paroxysmal atrial fibrillation: Secondary | ICD-10-CM | POA: Diagnosis not present

## 2018-06-23 DIAGNOSIS — I10 Essential (primary) hypertension: Secondary | ICD-10-CM | POA: Insufficient documentation

## 2018-06-23 DIAGNOSIS — I340 Nonrheumatic mitral (valve) insufficiency: Secondary | ICD-10-CM | POA: Insufficient documentation

## 2018-06-23 HISTORY — PX: CARDIOVERSION: SHX1299

## 2018-06-23 SURGERY — CARDIOVERSION
Anesthesia: General

## 2018-06-23 MED ORDER — ONDANSETRON HCL 4 MG/2ML IJ SOLN: 4.0000 mg | Freq: Once | INTRAMUSCULAR | Status: DC | PRN

## 2018-06-23 MED ORDER — PROPOFOL 10 MG/ML IV BOLUS
INTRAVENOUS | Status: DC | PRN
  Administered 2018-06-23: 50 mg via INTRAVENOUS
  Administered 2018-06-23: 30 mg via INTRAVENOUS

## 2018-06-23 MED ORDER — PROMETHAZINE HCL 25 MG/ML IJ SOLN: 6.2500 mg | INTRAMUSCULAR | Status: DC | PRN

## 2018-06-23 MED ORDER — SODIUM CHLORIDE 0.9 % IV SOLN
INTRAVENOUS | Status: DC | PRN
  Administered 2018-06-23: 08:00:00 via INTRAVENOUS

## 2018-06-23 MED ORDER — LIDOCAINE 2% (20 MG/ML) 5 ML SYRINGE
INTRAMUSCULAR | Status: DC | PRN
  Administered 2018-06-23: 80 mg via INTRAVENOUS

## 2018-06-23 NOTE — CV Procedure (Signed)
Direct current cardioversion:  Indication symptomatic A. Fibrillation.  Procedure: Using 80 mg of IV Propofol and 80 IV Lidocaine (for reducing venous pain) for achieving deep sedation, synchronized direct current cardioversion performed. Patient was delivered with 120 Joules of electricity X 1 with success to NSR. Patient tolerated the procedure well. No immediate complication noted.

## 2018-06-23 NOTE — Anesthesia Postprocedure Evaluation (Signed)
Anesthesia Post Note  Patient: Dustin Schneider  Procedure(s) Performed: CARDIOVERSION (N/A )     Patient location during evaluation: PACU Anesthesia Type: General Level of consciousness: awake and alert Pain management: pain level controlled Vital Signs Assessment: post-procedure vital signs reviewed and stable Respiratory status: spontaneous breathing, nonlabored ventilation, respiratory function stable and patient connected to nasal cannula oxygen Cardiovascular status: blood pressure returned to baseline and stable Postop Assessment: no apparent nausea or vomiting Anesthetic complications: no    Last Vitals:  Vitals:   06/23/18 0809 06/23/18 0818  BP: (!) 144/104 (!) 142/95  Pulse: 60 (!) 59  Resp: 15 14  Temp:    SpO2: 98% 100%    Last Pain:  Vitals:   06/23/18 0818  TempSrc:   PainSc: 0-No pain                 Kohlton Gilpatrick L Lincon Sahlin

## 2018-06-23 NOTE — Interval H&P Note (Signed)
History and Physical Interval Note:  06/23/2018 7:41 AM  [name]  has presented today for surgery, with the diagnosis of A.fib  The various methods of treatment have been discussed with the patient and family. After consideration of risks, benefits and other options for treatment, the patient has consented to  Procedure(s): CARDIOVERSION (N/A) as a surgical intervention .  The patient's history has been reviewed, patient examined, no change in status, stable for surgery.  I have reviewed the patient's chart and labs.  Questions were answered to the patient's satisfaction.     [name]

## 2018-06-23 NOTE — Transfer of Care (Signed)
Immediate Anesthesia Transfer of Care Note  Patient: XZAVIAN SEMMEL  Procedure(s) Performed: CARDIOVERSION (N/A )  Patient Location: Endoscopy Unit  Anesthesia Type:General  Level of Consciousness: awake, drowsy and patient cooperative  Airway & Oxygen Therapy: Patient Spontanous Breathing and Patient connected to nasal cannula oxygen  Post-op Assessment: Report given to RN and Post -op Vital signs reviewed and stable  Post vital signs: Reviewed and stable  Last Vitals:  Vitals Value Taken Time  BP 104/33 06/23/2018  7:53 AM  Temp    Pulse 60 06/23/2018  7:55 AM  Resp 19 06/23/2018  7:55 AM  SpO2 100 % 06/23/2018  7:55 AM    Last Pain:  Vitals:   06/23/18 0609  TempSrc: Oral  PainSc: 0-No pain         Complications: No apparent anesthesia complications

## 2018-06-23 NOTE — Anesthesia Procedure Notes (Signed)
Procedure Name: General with mask airway Date/Time: 06/23/2018 7:44 AM Performed by: Renato Shin, CRNA Pre-anesthesia Checklist: Patient identified, Emergency Drugs available, Suction available and Patient being monitored Patient Re-evaluated:Patient Re-evaluated prior to induction Oxygen Delivery Method: Ambu bag Preoxygenation: Pre-oxygenation with 100% oxygen Induction Type: IV induction Ventilation: Mask ventilation without difficulty Placement Confirmation: positive ETCO2,  CO2 detector and breath sounds checked- equal and bilateral Dental Injury: Teeth and Oropharynx as per pre-operative assessment

## 2018-07-14 ENCOUNTER — Ambulatory Visit: Payer: BC Managed Care – PPO | Admitting: Internal Medicine

## 2018-07-23 ENCOUNTER — Other Ambulatory Visit: Payer: Self-pay | Admitting: Physician Assistant

## 2018-09-02 ENCOUNTER — Other Ambulatory Visit: Payer: Self-pay | Admitting: Cardiology

## 2018-09-02 DIAGNOSIS — I480 Paroxysmal atrial fibrillation: Secondary | ICD-10-CM

## 2018-09-15 ENCOUNTER — Ambulatory Visit: Payer: BC Managed Care – PPO

## 2018-09-15 DIAGNOSIS — I480 Paroxysmal atrial fibrillation: Secondary | ICD-10-CM | POA: Diagnosis not present

## 2018-09-15 DIAGNOSIS — I10 Essential (primary) hypertension: Secondary | ICD-10-CM | POA: Diagnosis not present

## 2018-09-20 ENCOUNTER — Encounter: Payer: Self-pay | Admitting: Cardiology

## 2018-09-20 NOTE — Progress Notes (Signed)
Subjective:   '@Patient'  ID: Dustin Schneider, male    DOB: 1956/06/28, 63 y.o.   MRN: [id number]  Chief Complaint  Patient presents with  . Atrial Fibrillation  . Follow-up    HPI: Dustin Schneider  is a 63 y.o. male  wnewly noted atrial fibrillation with RVR. He has hypertension and denies symptoms to suggest sleep apnea.  Echocardiogram on 04/23/2018 with mild decrease in global wall motion and LVEF of 45%. Moderate MR. He then underwent exercise stress testing on 05/05/2018 that did not reveal any perfusion abnormalities, but did have decreased LVEF and was called intermediate study in view of this.  Patient had undergone direct current cardioversion for atrial fibrillation on 05/13/2018 and again on 06/23/2018 on Tikosyn. He has had couple breakthrough atrial fibrillation on Tikosyn, however recently has been more stable.  He now presents for a 6 week follow-up visit. He has been evaluated by Dr. Thompson Grayer was recommended continued medical therapy for now. Underwent echo for evaluation of LVEF.  Doing well and essentially asymptomatic.   Past Medical History:  Diagnosis Date  . Hypertension   . Nonischemic cardiomyopathy ([place])   . Paroxysmal atrial fibrillation ([place]) 06/15/2018  . Persistent atrial fibrillation   . Visit for monitoring Tikosyn therapy 06/12/2018    Past Surgical History:  Procedure Laterality Date  . CARDIOVERSION N/A 05/13/2018   Procedure: CARDIOVERSION;  Surgeon: [name], MD;  Location: [facility] ENDOSCOPY;  Service: Cardiovascular;  Laterality: N/A;  . CARDIOVERSION N/A 06/13/2018   Procedure: CARDIOVERSION;  Surgeon: [name], MD;  Location: [facility] ENDOSCOPY;  Service: Cardiovascular;  Laterality: N/A;  . CARDIOVERSION N/A 06/23/2018   Procedure: CARDIOVERSION;  Surgeon: [name], MD;  Location: [facility];  Service: Cardiovascular;  Laterality: N/A;  . excision of actinic keratosis    . EYE SURGERY     eye lift  . TONSILLECTOMY    . WISDOM TOOTH  EXTRACTION      Social History   Socioeconomic History  . Marital status: Divorced    Spouse name: Not on file  . Number of children: Not on file  . Years of education: Not on file  . Highest education level: Not on file  Occupational History  . Not on file  Social Needs  . Financial resource strain: Not on file  . Food insecurity:    Worry: Not on file    Inability: Not on file  . Transportation needs:    Medical: Not on file    Non-medical: Not on file  Tobacco Use  . Smoking status: Never Smoker  . Smokeless tobacco: Never Used  Substance and Sexual Activity  . Alcohol use: Yes  . Drug use: No  . Sexual activity: Not on file    Comment: DIVORCE  Lifestyle  . Physical activity:    Days per week: Not on file    Minutes per session: Not on file  . Stress: Not on file  Relationships  . Social connections:    Talks on phone: Not on file    Gets together: Not on file    Attends religious service: Not on file    Active member of club or organization: Not on file    Attends meetings of clubs or organizations: Not on file    Relationship status: Not on file  . Intimate partner violence:    Fear of current or ex partner: Not on file    Emotionally abused: Not on file  Physically abused: Not on file    Forced sexual activity: Not on file  Other Topics Concern  . 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 dofetilide (TIKOSYN) 500 MCG capsule TAKE (1) CAPSULE TWICE DAILY. (Patient taking differently: Take 500 mcg by mouth 2 (two) times daily. ) 60 capsule 3  . Magnesium Gluconate (MAGNESIUM 27) 500 (27 Mg) MG TABS Take 1 tablet by mouth daily.    . metoprolol succinate (TOPROL-XL) 100 MG 24 hr tablet Take 100 mg by mouth 2 (two) times daily. Take with or immediately following a meal.    . rivaroxaban (XARELTO) 20 MG  TABS tablet Take 1 tablet (20 mg total) by mouth daily with supper. 30 tablet 3  . valsartan (DIOVAN) 160 MG tablet Take 1 tablet (160 mg total) by mouth every evening. 30 tablet 3   No current facility-administered medications on file prior to visit.    Review of Systems  Constitutional: Negative for malaise/fatigue and weight loss.  Respiratory: Negative for cough, hemoptysis and shortness of breath.   Cardiovascular: Positive for palpitations (occasional). Negative for chest pain, claudication and leg swelling.  Gastrointestinal: Negative for abdominal pain, blood in stool, constipation, heartburn and vomiting.  Genitourinary: Negative for dysuria.  Musculoskeletal: Negative for joint pain and myalgias.  Neurological: Negative for dizziness, focal weakness and headaches.  Endo/Heme/Allergies: Does not bruise/bleed easily.  Psychiatric/Behavioral: Negative for depression. The patient is not nervous/anxious.   All other systems reviewed and are negative.     Objective:  Blood pressure (!) 148/91, height '5\' 7"'  (1.702 m), weight 172 lb 14.4 oz (78.4 kg), SpO2 96 %.  Physical Exam  Constitutional: He appears well-developed and well-nourished. No distress.  HENT:  Head: Atraumatic.  Eyes: Conjunctivae are normal.  Neck: Neck supple. No JVD present. No thyromegaly present.  Cardiovascular: Normal rate, regular rhythm and intact distal pulses. Exam reveals no gallop.  Murmur heard.  Crescendo mid to late systolic murmur is present at the apex. S1 and S2 normal. Mid systolic click present  Pulmonary/Chest: Effort normal and breath sounds normal.  Abdominal: Soft. Bowel sounds are normal.  Musculoskeletal: Normal range of motion.        General: No edema.  Neurological: He is alert.  Skin: Skin is warm and dry.  Psychiatric: He has a normal mood and affect.    CARDIAC STUDIES:  Echocardiogram 09/15/2018: Left ventricle cavity is normal in size. Mild concentric hypertrophy of the  left ventricle. Normal global wall motion. Normal diastolic filling pattern. Calculated EF 69%. Left atrial cavity is mildly dilated. Moderate (Grade II) aortic regurgitation. Mild prolapse of the mitral valve leaflets. Moderate (Grade III) mitral regurgitation. Mild tricuspid regurgitation. Estimated pulmonary artery systolic pressure 24  mmHg. No significant change compared to previous study 04/23/2018 in MR and AI, EF improved from 45%.  Nuclear stress test  05/05/2018: 1. The resting electrocardiogram demonstrated atrial fibrillation. The stress electrocardiogram was positive for ischemia with 2 mm downsloping ST depression in the inferior and lateral leads, persisted for 2 mintues into recovery. Stress symptoms included palpitations and fatigue. Patient exercised on Bruce protocol for 6:00 minutes and achieved 7.05 METS. Stress test terminated due to fatigue and 113% MPHR achieved (Target HR >85%). 2. Left ventricular cavity is noted to be normal on the rest and stress studies.  SPECT images demonstrate homogeneous tracer distribution throughout the myocardium. The left ventricular ejection fraction was calculated to be 38%, but visually appears to be at least low normal without wall motion abnormality. This is an intermediate risk study due to abnormal ST change and low LVEF (difficult in view of gating with A. Fib), clinical correlation recommended.S:    Assessment & Recommendations:   1. Paroxysmal atrial fibrillation (HCC) CHA2DS2-VASc Score is 1.  -(CHF; HTN; vasc disease DM,  Male = 1; Age <65 =0; 65-74 = 1,  >75 =2; stroke = 2).  -(Yearly risk of stroke: Score of 1=1.3; 2=2.2; 3=3.2; 4=4; 5=6.7; 6=9.8; 7=>9.8)  EKG 09/22/2018: Marked sinus bradycardia at the rate of 49 bpm, normal axis.  Normal QT interval, QTC 470 ms.  EKG 07/08/2018: Marked sinus bradycardia at the rate of 56 bpm with frequent PACs. Diffuse nonspecific ST-T abnormality. Low-voltage complexes. Normal QT  interval. Presently on Tikosyn and maintains sinus. 2. NICM (nonischemic cardiomyopathy) ([place]): Status resolved. EF in Feb 60%. Was LVEF 45% in Sept 2019  3. Essential hypertension UnControlled, I'll increase valsartan from 160 mg to 320 mg daily, this will also help with aortic regurgitation and mitral regurgitation.. 05/07/2018: Glucose 106, creatinine 1.18, EGFR 65/75, sodium 145, potassium 4.2, BMP otherwise normal.  05/02/2018: Hemoglobin A1c 5.8%.   4. Visit for monitoring Tikosyn therapy Normal QT today.   5.  Mitral valve prolapse: Moderate MR, on physical exam S1 is crisp.  Very soft murmur is heard.  Aortic regurgitation remained stable by echocardiogram.  Office visit in 6 months with follow-up of CMP along with CBC and magnesium levels.  Recommendation: We have discussed regarding the long-term anti-correlation, as his cardioembolic risk is only 1.0, the could certainly discontinue this, however per patient preference will continue the same for now.  I'm glad that his EF is improved.  [name], MD, [facility] 09/22/2018, 12:56 PM [facility]. [place] Pager: [phone] Office: [phone] If no answer Cell [phone]

## 2018-09-22 ENCOUNTER — Ambulatory Visit: Payer: BC Managed Care – PPO | Admitting: Cardiology

## 2018-09-22 ENCOUNTER — Encounter: Payer: Self-pay | Admitting: Cardiology

## 2018-09-22 ENCOUNTER — Other Ambulatory Visit: Payer: Self-pay

## 2018-09-22 VITALS — BP 148/91 | Ht 67.0 in | Wt 172.9 lb

## 2018-09-22 DIAGNOSIS — I480 Paroxysmal atrial fibrillation: Secondary | ICD-10-CM

## 2018-09-22 DIAGNOSIS — Z5181 Encounter for therapeutic drug level monitoring: Secondary | ICD-10-CM | POA: Diagnosis not present

## 2018-09-22 DIAGNOSIS — I10 Essential (primary) hypertension: Secondary | ICD-10-CM

## 2018-09-22 DIAGNOSIS — Z79899 Other long term (current) drug therapy: Secondary | ICD-10-CM

## 2018-09-22 DIAGNOSIS — I428 Other cardiomyopathies: Secondary | ICD-10-CM | POA: Diagnosis not present

## 2018-09-22 DIAGNOSIS — I341 Nonrheumatic mitral (valve) prolapse: Secondary | ICD-10-CM

## 2018-09-22 MED ORDER — VALSARTAN 320 MG PO TABS: 320.0000 mg | ORAL_TABLET | Freq: Every evening | ORAL | 3 refills | Status: DC

## 2019-02-05 ENCOUNTER — Ambulatory Visit: Payer: BC Managed Care – PPO | Admitting: Cardiology

## 2019-03-20 ENCOUNTER — Encounter: Payer: Self-pay | Admitting: Cardiology

## 2019-03-23 ENCOUNTER — Ambulatory Visit (INDEPENDENT_AMBULATORY_CARE_PROVIDER_SITE_OTHER): Payer: BC Managed Care – PPO | Admitting: Cardiology

## 2019-03-23 ENCOUNTER — Encounter: Payer: Self-pay | Admitting: Cardiology

## 2019-03-23 ENCOUNTER — Other Ambulatory Visit: Payer: Self-pay

## 2019-03-23 VITALS — BP 129/81 | HR 54 | Ht 67.5 in | Wt 179.1 lb

## 2019-03-23 DIAGNOSIS — Z13220 Encounter for screening for lipoid disorders: Secondary | ICD-10-CM

## 2019-03-23 DIAGNOSIS — I341 Nonrheumatic mitral (valve) prolapse: Secondary | ICD-10-CM

## 2019-03-23 DIAGNOSIS — I480 Paroxysmal atrial fibrillation: Secondary | ICD-10-CM | POA: Diagnosis not present

## 2019-03-23 NOTE — Progress Notes (Signed)
Primary Physician/Referring:  Patient, No Pcp Per  Patient ID: Dustin Schneider, male    DOB: 1955/10/02, 63 y.o.   MRN: [id number]  Chief Complaint  Patient presents with  . Atrial Fibrillation  . Hypertension   HPI:    HPI: Dustin Schneider  is a 63 y.o. Caucasian male with paroxysmal atrial fibrillation, initially noted in September 2019 with an echocardiogram had revealed mild LV systolic dysfunction.  He was also evaluated by EP Dr. Thompson Grayer who recommended continued medical therapy.  He is presently on Tikosyn.  Last direct-current cardioversion was on 05/13/2018.    Past Medical History:  Diagnosis Date  . Hypertension   . NICM (nonischemic cardiomyopathy) ([place]) 06/15/2018   04/23/18: EF 45%, 09/15/18: NOrmal LVEF  . Paroxysmal atrial fibrillation ([place]) 06/15/2018  . Visit for monitoring Tikosyn therapy 06/12/2018   Past Surgical History:  Procedure Laterality Date  . CARDIOVERSION N/A 05/13/2018   Procedure: CARDIOVERSION;  Surgeon: [name], MD;  Location: [facility] ENDOSCOPY;  Service: Cardiovascular;  Laterality: N/A;  . CARDIOVERSION N/A 06/13/2018   Procedure: CARDIOVERSION;  Surgeon: [name], MD;  Location: [facility] ENDOSCOPY;  Service: Cardiovascular;  Laterality: N/A;  . CARDIOVERSION N/A 06/23/2018   Procedure: CARDIOVERSION;  Surgeon: [name], MD;  Location: [place];  Service: Cardiovascular;  Laterality: N/A;  . excision of actinic keratosis    . EYE SURGERY     eye lift  . TONSILLECTOMY    . WISDOM TOOTH EXTRACTION     Social History   Socioeconomic History  . Marital status: Divorced    Spouse name: Not on file  . Number of children: 1  . Years of education: Not on file  . Highest education level: Not on file  Occupational History  . Not on file  Social Needs  . Financial resource strain: Not on file  . Food insecurity    Worry: Not on file    Inability: Not on file  . Transportation needs    Medical: Not on file    Non-medical: Not on file   Tobacco Use  . Smoking status: Never Smoker  . Smokeless tobacco: Never Used  Substance and Sexual Activity  . Alcohol use: Yes    Comment: very little  . Drug use: No  . Sexual activity: Not on file    Comment: DIVORCE  Lifestyle  . Physical activity    Days per week: Not on file    Minutes per session: Not on file  . Stress: Not on file  Relationships  . Social Herbalist on phone: Not on file    Gets together: Not on file    Attends religious service: Not on file    Active member of club or organization: Not on file    Attends meetings of clubs or organizations: Not on file    Relationship status: Not on file  . Intimate partner violence    Fear of current or ex partner: Not on file    Emotionally abused: Not on file    Physically abused: Not on file    Forced sexual activity: Not on file  Other Topics Concern  . 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  Cardiovascular: Positive for palpitations. Negative for dyspnea on exertion, leg swelling and  syncope.  Endocrine: Negative for cold intolerance.  Hematologic/Lymphatic: Does not bruise/bleed easily.  Musculoskeletal: Negative for joint swelling.  Gastrointestinal: Negative for abdominal pain, anorexia, change in bowel habit, hematochezia and melena.  Neurological: Negative for headaches and light-headedness.  Psychiatric/Behavioral: Negative for depression and substance abuse.  All other systems reviewed and are negative.  Objective  Blood pressure 129/81, pulse (!) 54, height 5' 7.5" (1.715 m), weight 179 lb 1.6 oz (81.2 kg), SpO2 96 %. Body mass index is 27.64 kg/m.   Physical Exam  Constitutional: He appears well-developed and well-nourished. No distress.  HENT:  Head: Atraumatic.  Eyes: Conjunctivae are normal.  Neck: Neck  supple. No JVD present. No thyromegaly present.  Cardiovascular: Normal rate, regular rhythm, normal heart sounds and intact distal pulses. Exam reveals no gallop.  No murmur heard. Pulmonary/Chest: Effort normal and breath sounds normal.  Abdominal: Soft. Bowel sounds are normal.  Musculoskeletal: Normal range of motion.  Neurological: He is alert.  Skin: Skin is warm and dry.  Psychiatric: He has a normal mood and affect.   Radiology: No results found.  Laboratory examination:   CMP Latest Ref Rng & Units 06/15/2018 06/14/2018 06/13/2018  Glucose 70 - 99 mg/dL 101(H) 105(H) 105(H)  BUN 8 - 23 mg/dL 16 15 22   Creatinine 0.61 - 1.24 mg/dL 1.14 1.11 1.13  Sodium 135 - 145 mmol/L 138 139 137  Potassium 3.5 - 5.1 mmol/L 3.8 4.2 4.0  Chloride 98 - 111 mmol/L 106 106 102  CO2 22 - 32 mmol/L 26 28 28   Calcium 8.9 - 10.3 mg/dL 8.8(L) 8.9 9.5   No flowsheet data found. Lipid Panel  No results found for: CHOL, TRIG, HDL, CHOLHDL, VLDL, LDLCALC, LDLDIRECT HEMOGLOBIN A1C No results found for: HGBA1C, MPG TSH No results for input(s): TSH in the last 8760 hours. Medications   Current Outpatient Medications  Medication Instructions  . dofetilide (TIKOSYN) 500 MCG capsule TAKE (1) CAPSULE TWICE DAILY.  . Magnesium Gluconate (MAGNESIUM 27) 500 (27 Mg) MG TABS 1 tablet, Oral, Daily  . metoprolol succinate (TOPROL-XL) 100 mg, Oral, 2 times daily, Take with or immediately following a meal.   . valsartan (DIOVAN) 320 mg, Oral, Every evening    Cardiac Studies:   Echocardiogram 09/15/2018: Left ventricle cavity is normal in size. Mild concentric hypertrophy of the left ventricle. Normal global wall motion. Normal diastolic filling pattern. Calculated EF 69%. Left atrial cavity is mildly dilated. Moderate (Grade II) aortic regurgitation. Mild prolapse of the mitral valve leaflets. Moderate (Grade III) mitral regurgitation. Mild tricuspid regurgitation. Estimated pulmonary artery systolic  pressure 24  mmHg. No significant change compared to previous study 04/23/2018 in MR and AI, EF improved from 45%.  Nuclear stress test   05/05/2018: 1. The resting electrocardiogram demonstrated atrial fibrillation. The stress electrocardiogram was positive for ischemia with 2 mm downsloping ST depression in the inferior and lateral leads, persisted for 2 mintues into recovery. Stress symptoms included palpitations and fatigue. Patient exercised on Bruce protocol for 6:00 minutes and achieved 7.05 METS. Stress test terminated due to fatigue and 113% MPHR achieved (Target HR >85%). 2. Left ventricular cavity is noted to be normal on the rest and stress studies. SPECT images demonstrate homogeneous tracer distribution throughout the myocardium. The left ventricular ejection fraction was calculated to be 38%, but visually appears to be at least low normal without wall motion abnormality. This is an intermediate risk study due to abnormal ST change and low LVEF (difficult in view of gating with A.  Fib), clinical correlation recommended.  Assessment     ICD-10-CM   1. Paroxysmal atrial fibrillation (HCC)  I48.0 EKG 12-Lead   CHA2DS2-VASc Score is 1.  Yearly risk of stroke: Score of 1.3;  2. MVP (mitral valve prolapse)  I34.1 EKG 12-Lead  3. Screening cholesterol level  Z13.220 Lipid Panel With LDL/HDL Ratio    EKG 03/23/2019: Marked sinus bradycardia with rate of 54 bpm, borderline left atrial abnormality, normal axis.  No evidence of ischemia.  Normal QT interval.  Normal EKG. No significant change from  EKG 09/22/2018: Marked sinus bradycardia at the rate of 49 bpm.  Recommendations:   He is currently doing well and has not had any recurrence of atrial fibrillation, he is now off of anticoagulation as his cardioembolic risk is 0%.  He is also maintaining sinus rhythm with occasional brief palpitations.  With regard to mitral valve prolapse, I do not hear any murmur, no need for Antibiotic  prophylaxis.  No clinical evidence of heart failure, blood pressure is well controlled, he needs routine labs have been ordered.  I'll see him back in 6 months.  [name], MD, [facility] 03/23/2019, 9:54 AM [facility]. [place] Pager: [phone] Office: [phone] If no answer Cell [phone]

## 2019-03-24 ENCOUNTER — Other Ambulatory Visit: Payer: Self-pay | Admitting: Cardiology

## 2019-03-25 ENCOUNTER — Other Ambulatory Visit: Payer: Self-pay

## 2019-03-25 DIAGNOSIS — E7800 Pure hypercholesterolemia, unspecified: Secondary | ICD-10-CM

## 2019-03-25 LAB — MAGNESIUM: Magnesium: 2.2 mg/dL (ref 1.6–2.3)

## 2019-03-25 LAB — COMPREHENSIVE METABOLIC PANEL
ALT: 25 IU/L (ref 0–44)
AST: 14 IU/L (ref 0–40)
Albumin/Globulin Ratio: 2 (ref 1.2–2.2)
Albumin: 4.2 g/dL (ref 3.8–4.8)
Alkaline Phosphatase: 43 IU/L (ref 39–117)
BUN/Creatinine Ratio: 18 (ref 10–24)
BUN: 20 mg/dL (ref 8–27)
Bilirubin Total: 0.7 mg/dL (ref 0.0–1.2)
CO2: 25 mmol/L (ref 20–29)
Calcium: 9.6 mg/dL (ref 8.6–10.2)
Chloride: 101 mmol/L (ref 96–106)
Creatinine, Ser: 1.12 mg/dL (ref 0.76–1.27)
GFR calc Af Amer: 80 mL/min/{1.73_m2} (ref >59)
GFR calc non Af Amer: 69 mL/min/{1.73_m2} (ref >59)
Globulin, Total: 2.1 g/dL (ref 1.5–4.5)
Glucose: 105 mg/dL — ABNORMAL HIGH (ref 65–99)
Potassium: 5.2 mmol/L (ref 3.5–5.2)
Sodium: 139 mmol/L (ref 134–144)
Total Protein: 6.3 g/dL (ref 6.0–8.5)

## 2019-03-25 LAB — CBC
Hematocrit: 43.6 % (ref 37.5–51.0)
Hemoglobin: 14.4 g/dL (ref 13.0–17.7)
MCH: 32.4 pg (ref 26.6–33.0)
MCHC: 33 g/dL (ref 31.5–35.7)
MCV: 98 fL — ABNORMAL HIGH (ref 79–97)
Platelets: 211 10*3/uL (ref 150–450)
RBC: 4.45 x10E6/uL (ref 4.14–5.80)
RDW: 13.9 % (ref 11.6–15.4)
WBC: 6.1 10*3/uL (ref 3.4–10.8)

## 2019-03-25 LAB — LIPID PANEL WITH LDL/HDL RATIO
Cholesterol, Total: 225 mg/dL — ABNORMAL HIGH (ref 100–199)
HDL: 45 mg/dL (ref >39)
LDL Calculated: 134 mg/dL — ABNORMAL HIGH (ref 0–99)
LDl/HDL Ratio: 3 ratio (ref 0.0–3.6)
Triglycerides: 229 mg/dL — ABNORMAL HIGH (ref 0–149)
VLDL Cholesterol Cal: 46 mg/dL — ABNORMAL HIGH (ref 5–40)

## 2019-03-25 MED ORDER — ROSUVASTATIN CALCIUM 10 MG PO TABS: 10.0000 mg | ORAL_TABLET | Freq: Every day | ORAL | 3 refills | Status: DC

## 2019-03-25 NOTE — Progress Notes (Signed)
Crestor 10 mg daily and recheck in 3 months

## 2019-03-25 NOTE — Progress Notes (Signed)
Pt aware and meds are sent in

## 2019-03-26 NOTE — Progress Notes (Signed)
Pt aware.

## 2019-03-27 ENCOUNTER — Telehealth: Payer: Self-pay

## 2019-03-27 NOTE — Telephone Encounter (Signed)
Pt called and stated that he thought he was in afib and that he thought crestor had something to do with it advised that crestor doesn't have anything to do with his afib sometimes it just happens, monitor over the weekend and call us on Monday if symptoms continue.

## 2019-03-31 ENCOUNTER — Telehealth: Payer: Self-pay

## 2019-03-31 NOTE — Telephone Encounter (Signed)
Would not recommend that as his lipids are severely elevated, I can also try another statin. But will do what he wishes but my bias is therapy with statins. He can be arranged a VV to discuss more

## 2019-03-31 NOTE — Telephone Encounter (Signed)
Pt called and he wants to let you know that he is stopping his crestor cause it is causing irreg hr and afib and he wants to work on his cholesterol through diet and exercise

## 2019-04-02 ENCOUNTER — Other Ambulatory Visit: Payer: Self-pay | Admitting: Cardiology

## 2019-05-18 ENCOUNTER — Telehealth: Payer: Self-pay

## 2019-05-18 NOTE — Telephone Encounter (Signed)
**Note De-Identified Dustin Schneider Obfuscation** We received a Tikosyn PA request from [place].  It is unclear if the pt plans to see Dr Dustin Schneider or Dr Dustin Schneider at [facility] as he canceled his last OV appt with Dr Dustin Schneider that was scheduled for 07/14/2018.and has been seen by Dr Dustin Schneider 2 since then.  I have left a message on the pts VM asking him to call me back.

## 2019-05-21 ENCOUNTER — Other Ambulatory Visit: Payer: Self-pay | Admitting: Cardiology

## 2019-05-21 DIAGNOSIS — Z5181 Encounter for therapeutic drug level monitoring: Secondary | ICD-10-CM

## 2019-05-21 DIAGNOSIS — I10 Essential (primary) hypertension: Secondary | ICD-10-CM

## 2019-05-21 DIAGNOSIS — Z79899 Other long term (current) drug therapy: Secondary | ICD-10-CM

## 2019-05-21 DIAGNOSIS — I480 Paroxysmal atrial fibrillation: Secondary | ICD-10-CM

## 2019-05-22 NOTE — Telephone Encounter (Signed)
I have left another message asking the pt to call me back.  Per the pts OV note with Dr [name] on 09/22/18 the pt was advised to f/u in 6 months for "A-fib and Tikosyn monitoring". The pt did f/u with Dr [name] 6 months later on 03/23/2019.  The pt has not been seen in our office since 06/11/2018.  Will fax Tikosyn letter from [facility] to Dr Irven Shelling office at [phone].

## 2019-07-08 ENCOUNTER — Other Ambulatory Visit: Payer: Self-pay | Admitting: Cardiology

## 2019-08-06 ENCOUNTER — Other Ambulatory Visit: Payer: Self-pay | Admitting: Cardiology

## 2019-08-06 DIAGNOSIS — I10 Essential (primary) hypertension: Secondary | ICD-10-CM

## 2019-08-20 ENCOUNTER — Other Ambulatory Visit: Payer: Self-pay | Admitting: Cardiology

## 2019-09-24 ENCOUNTER — Ambulatory Visit: Payer: BC Managed Care – PPO | Admitting: Cardiology

## 2019-09-24 ENCOUNTER — Other Ambulatory Visit: Payer: Self-pay

## 2019-09-24 ENCOUNTER — Ambulatory Visit: Payer: BC Managed Care – PPO | Attending: Family

## 2019-09-24 ENCOUNTER — Encounter: Payer: Self-pay | Admitting: Cardiology

## 2019-09-24 VITALS — BP 125/82 | HR 70 | Temp 96.7°F | Resp 16 | Ht 69.0 in | Wt 183.0 lb

## 2019-09-24 DIAGNOSIS — I480 Paroxysmal atrial fibrillation: Secondary | ICD-10-CM | POA: Diagnosis not present

## 2019-09-24 DIAGNOSIS — I10 Essential (primary) hypertension: Secondary | ICD-10-CM | POA: Diagnosis not present

## 2019-09-24 DIAGNOSIS — Z23 Encounter for immunization: Secondary | ICD-10-CM | POA: Insufficient documentation

## 2019-09-24 DIAGNOSIS — E782 Mixed hyperlipidemia: Secondary | ICD-10-CM

## 2019-09-24 NOTE — Progress Notes (Signed)
Primary Physician/Referring:  Patient, No Pcp Per  Patient ID: EXAVIOR TEEM, male    DOB: [birth year], 64 y.o.   MRN: [id number]  Chief Complaint  Patient presents with  . Atrial Fibrillation  . Hypertension   HPI:    HPI: Dustin Schneider  is a 64 y.o. Caucasian male with paroxysmal atrial fibrillation, initially noted in September 2019 with an echocardiogram had revealed mild LV systolic dysfunction.  He was also evaluated by EP Dr. Thompson Grayer who recommended continued medical therapy.  He is presently on Tikosyn.  Last direct-current cardioversion was on 05/13/2018.    States he drank wine last night and felt his heart skipping since 1Am. Otherwise has been doing well.   Past Medical History:  Diagnosis Date  . Hypertension   . NICM (nonischemic cardiomyopathy) ([place]) 06/15/2018   04/23/18: EF 45%, 09/15/18: NOrmal LVEF  . Paroxysmal atrial fibrillation ([place]) 06/15/2018  . Visit for monitoring Tikosyn therapy 06/12/2018   Past Surgical History:  Procedure Laterality Date  . CARDIOVERSION N/A 05/13/2018   Procedure: CARDIOVERSION;  Surgeon: [name], MD;  Location: [facility] ENDOSCOPY;  Service: Cardiovascular;  Laterality: N/A;  . CARDIOVERSION N/A 06/13/2018   Procedure: CARDIOVERSION;  Surgeon: [name], MD;  Location: [facility] ENDOSCOPY;  Service: Cardiovascular;  Laterality: N/A;  . CARDIOVERSION N/A 06/23/2018   Procedure: CARDIOVERSION;  Surgeon: [name], MD;  Location: [place];  Service: Cardiovascular;  Laterality: N/A;  . excision of actinic keratosis    . EYE SURGERY     eye lift  . TONSILLECTOMY    . WISDOM TOOTH EXTRACTION     Social History   Tobacco Use  . Smoking status: Never Smoker  . Smokeless tobacco: Never Used  Substance Use Topics  . Alcohol use: Yes    Comment: very little    ROS  Review of Systems  Cardiovascular: Positive for palpitations. Negative for dyspnea on exertion and leg swelling.  Gastrointestinal: Negative for melena.    Objective  Blood pressure 125/82, pulse 70, temperature (!) 96.7 F (35.9 C), temperature source Temporal, resp. rate 16, height 5\' 9"  (1.753 m), weight 183 lb (83 kg), SpO2 95 %. Body mass index is 27.02 kg/m.   Vitals with BMI 09/24/2019 09/24/2019 03/23/2019  Height - 5\' 9"  5' 7.5"  Weight - 183 lbs 179 lbs 2 oz  BMI - 0000000 123456  Systolic 0000000 123456 [id number]  Diastolic 82 123456 81  Pulse 70 76 54    Physical Exam  Constitutional: He appears well-developed and well-nourished. No distress.  HENT:  Head: Atraumatic.  Eyes: Conjunctivae are normal.  Cardiovascular: Normal rate and intact distal pulses. An irregularly irregular rhythm present. Exam reveals no gallop.  No murmur heard. S1 variable, S2 normal.   No JVD, No leg edema.   Pulmonary/Chest: Effort normal and breath sounds normal.  Abdominal: Soft. Bowel sounds are normal.   Radiology: No results found.  Laboratory examination:   CMP Latest Ref Rng & Units 03/24/2019 06/15/2018 06/14/2018  Glucose 65 - 99 mg/dL 105(H) 101(H) 105(H)  BUN 8 - 27 mg/dL 20 16 15   Creatinine 0.76 - 1.27 mg/dL 1.12 1.14 1.11  Sodium 134 - 144 mmol/L 139 138 139  Potassium 3.5 - 5.2 mmol/L 5.2 3.8 4.2  Chloride 96 - 106 mmol/L 101 106 106  CO2 20 - 29 mmol/L 25 26 28   Calcium 8.6 - 10.2 mg/dL 9.6 8.8(L) 8.9  Total Protein 6.0 - 8.5 g/dL 6.3 - -  Total  Bilirubin 0.0 - 1.2 mg/dL 0.7 - -  Alkaline Phos 39 - 117 IU/L 43 - -  AST 0 - 40 IU/L 14 - -  ALT 0 - 44 IU/L 25 - -   CBC Latest Ref Rng & Units 03/24/2019  WBC 3.4 - 10.8 x10E3/uL 6.1  Hemoglobin 13.0 - 17.7 g/dL 14.4  Hematocrit 37.5 - 51.0 % 43.6  Platelets 150 - 450 x10E3/uL 211   Lipid Panel     Component Value Date/Time   CHOL 225 (H) 03/24/2019 0838   TRIG 229 (H) 03/24/2019 0838   HDL 45 03/24/2019 0838   LDLCALC 134 (H) 03/24/2019 0838   Mg 2.2 mg 03/24/2019  HEMOGLOBIN A1C No results found for: HGBA1C, MPG TSH No results for input(s): TSH in the last 8760  hours. Medications   Current Outpatient Medications  Medication Instructions  . dofetilide (TIKOSYN) 500 mcg, Oral, 2 times daily  . Magnesium Gluconate (MAGNESIUM 27) 500 (27 Mg) MG TABS 1 tablet, Oral, Daily  . metoprolol succinate (TOPROL-XL) 100 MG 24 hr tablet TAKE 1 TABLET BY MOUTH TWICE DAILY.  . valsartan (DIOVAN) 320 MG tablet TAKE ONE TABLET AT BEDTIME.  Marland Kitchen XARELTO 20 MG TABS tablet TAKE ONE TABLET IN THE EVENING AFTER DINNER.   Cardiac Studies:   Echocardiogram 09/15/2018: Left ventricle cavity is normal in size. Mild concentric hypertrophy of the left ventricle. Normal global wall motion. Normal diastolic filling pattern. Calculated EF 69%. Left atrial cavity is mildly dilated. Moderate (Grade II) aortic regurgitation. Mild prolapse of the mitral valve leaflets. Moderate (Grade III) mitral regurgitation. Mild tricuspid regurgitation. Estimated pulmonary artery systolic pressure 24  mmHg. No significant change compared to previous study 04/23/2018 in MR and AI, EF improved from 45%.  Nuclear stress test   05/05/2018: 1. The resting electrocardiogram demonstrated atrial fibrillation. The stress electrocardiogram was positive for ischemia with 2 mm downsloping ST depression in the inferior and lateral leads, persisted for 2 mintues into recovery. Stress symptoms included palpitations and fatigue. Patient exercised on Bruce protocol for 6:00 minutes and achieved 7.05 METS. Stress test terminated due to fatigue and 113% MPHR achieved (Target HR >85%). 2. Left ventricular cavity is noted to be normal on the rest and stress studies. SPECT images demonstrate homogeneous tracer distribution throughout the myocardium. The left ventricular ejection fraction was calculated to be 38%, but visually appears to be at least low normal without wall motion abnormality. This is an intermediate risk study due to abnormal ST change and low LVEF (difficult in view of gating with A. Fib), clinical  correlation recommended.  Assessment     ICD-10-CM   1. Paroxysmal atrial fibrillation (HCC)  I48.0 EKG 12-Lead  2. Essential hypertension  I10 TSH    CBC    Magnesium    Magnesium    CBC    TSH  3. Mixed hyperlipidemia  E78.2 Lipid Panel With LDL/HDL Ratio    Lipid Panel With LDL/HDL Ratio    EKG 03/23/2019: Marked sinus bradycardia with rate of 54 bpm, borderline left atrial abnormality, normal axis.  No evidence of ischemia.  Normal QT interval.  Normal EKG. No significant change from  EKG 09/22/2018: Marked sinus bradycardia at the rate of 49 bpm.  Recommendations:    Dustin Schneider  is a 65 y.o. Caucasian male with paroxysmal atrial fibrillation, initially noted in September 2019 with an echocardiogram had revealed mild LV systolic dysfunction.  He was also evaluated by EP Dr. Thompson Grayer who recommended continued medical  therapy.  He is presently on Tikosyn.  Last direct-current cardioversion was on 05/13/2018.    He is presently back in atrial fibrillation, admits to having 2 glasses of wine last night.  Advised him that if he continues to have persistent atrial fibrillation to call us so we can set him up for cardioversion.  He has not had any bleeding diathesis.  He will need labs including lipids, CBC, CMP and magnesium levels along with TSH.  I'd like to see him back in 3 months for follow-up. BP is controlled.   I discussed with him regarding mixed hyperlipidemia, he is reluctant to taking statins.  We will recheck the levels and see if I can convince him on his next office visit in 3 months.  [name], MD, [facility] 09/24/2019, 9:51 AM [facility] [place] Pager: [phone] Office: [phone] If no answer Cell [phone]

## 2019-09-24 NOTE — Progress Notes (Signed)
   Covid-19 Vaccination Clinic  Name:  Dustin Schneider    MRN: [id number] DOB: [birth year]  09/24/2019  [name] was observed post Covid-19 immunization for 30 minutes based on pre-vaccination screening without incidence. He was provided with Vaccine Information Sheet and instruction to access the V-Safe system.   [name] was instructed to call 911 with any severe reactions post vaccine: Marland Kitchen Difficulty breathing  . Swelling of your face and throat  . A fast heartbeat  . A bad rash all over your body  . Dizziness and weakness    Immunizations Administered    Name Date Dose VIS Date Route   Moderna COVID-19 Vaccine 09/24/2019 12:00 PM 0.5 mL 07/14/2019 Intramuscular   Manufacturer: Moderna   Lot: [id number]   [id number]

## 2019-10-08 ENCOUNTER — Other Ambulatory Visit: Payer: Self-pay | Admitting: Cardiology

## 2019-10-27 ENCOUNTER — Ambulatory Visit: Payer: BC Managed Care – PPO | Attending: Internal Medicine

## 2019-10-27 DIAGNOSIS — Z23 Encounter for immunization: Secondary | ICD-10-CM

## 2019-10-27 NOTE — Progress Notes (Signed)
   Covid-19 Vaccination Clinic  Name:  NURI BILLEY    MRN: [id number] DOB: October 02, 1955  10/27/2019  Mr. Udelhofen was observed post Covid-19 immunization for 15 minutes without incident. He was provided with Vaccine Information Sheet and instruction to access the V-Safe system.   Mr. Puhr was instructed to call 911 with any severe reactions post vaccine: Marland Kitchen Difficulty breathing  . Swelling of face and throat  . A fast heartbeat  . A bad rash all over body  . Dizziness and weakness   Immunizations Administered    Name Date Dose VIS Date Route   Moderna COVID-19 Vaccine 10/27/2019 10:54 AM 0.5 mL 07/14/2019 Intramuscular   Manufacturer: Moderna   Lot: [id number]   [id number]

## 2019-11-23 ENCOUNTER — Other Ambulatory Visit: Payer: Self-pay | Admitting: Cardiology

## 2019-12-17 ENCOUNTER — Telehealth: Payer: Self-pay

## 2019-12-17 ENCOUNTER — Other Ambulatory Visit: Payer: Self-pay | Admitting: Cardiology

## 2019-12-17 DIAGNOSIS — R739 Hyperglycemia, unspecified: Secondary | ICD-10-CM

## 2019-12-17 NOTE — Telephone Encounter (Signed)
Done.   Note  Patient called and stated that a (CMP) was not in todays bloodwork. He says he recalls you looking for "GLUCOSE COUNT". If so, can you add the order on and the lab tech said they can use the blood they drew today. Please advise.   Rise Paganini, I placed the order today

## 2019-12-17 NOTE — Telephone Encounter (Signed)
Patient called and stated that a (CMP) was not in todays bloodwork. He says he recalls you looking for "GLUCOSE COUNT". If so, can you add the order on and the lab tech said they can use the blood they drew today. Please advise.

## 2019-12-18 LAB — LIPID PANEL WITH LDL/HDL RATIO
Cholesterol, Total: 226 mg/dL — ABNORMAL HIGH (ref 100–199)
HDL: 39 mg/dL — ABNORMAL LOW (ref >39)
LDL Chol Calc (NIH): 154 mg/dL — ABNORMAL HIGH (ref 0–99)
LDL/HDL Ratio: 3.9 ratio — ABNORMAL HIGH (ref 0.0–3.6)
Triglycerides: 180 mg/dL — ABNORMAL HIGH (ref 0–149)
VLDL Cholesterol Cal: 33 mg/dL (ref 5–40)

## 2019-12-18 LAB — CBC
Hematocrit: 45.5 % (ref 37.5–51.0)
Hemoglobin: 15.3 g/dL (ref 13.0–17.7)
MCH: 31.5 pg (ref 26.6–33.0)
MCHC: 33.6 g/dL (ref 31.5–35.7)
MCV: 94 fL (ref 79–97)
Platelets: 220 10*3/uL (ref 150–450)
RBC: 4.85 x10E6/uL (ref 4.14–5.80)
RDW: 13.3 % (ref 11.6–15.4)
WBC: 6 10*3/uL (ref 3.4–10.8)

## 2019-12-18 LAB — MAGNESIUM: Magnesium: 2.2 mg/dL (ref 1.6–2.3)

## 2019-12-18 LAB — TSH: TSH: 3.16 u[IU]/mL (ref 0.450–4.500)

## 2019-12-18 NOTE — Progress Notes (Signed)
Primary Physician/Referring:  Patient, No Pcp Per  Patient ID: Dustin Schneider, male    DOB: [birth year], 64 y.o.   MRN: [id number]  Chief Complaint  Patient presents with  . Atrial Fibrillation  . Hyperlipidemia  . Follow-up    3 month   HPI:    Dustin Schneider  is a 64 y.o.  Caucasian male with paroxysmal atrial fibrillation, initially noted in September 2019 with an echocardiogram had revealed mild LV systolic dysfunction.  He was also evaluated by EP Dr. Thompson Grayer who recommended continued medical therapy.  He is presently on Tikosyn.  Last direct-current cardioversion was on 05/13/2018.  EF has normalized since maintaining sinus rhythm.  Past medical history significant for hypertension, hyperglycemia.  Significant interval he had 2 drug-eluting, he had an episode of A. fib.  Since then he has been abstinent from alcohol.  Presently doing well and underwent labs and presents for follow-up.  Past Medical History:  Diagnosis Date  . Hypertension   . NICM (nonischemic cardiomyopathy) ([place]) 06/15/2018   04/23/18: EF 45%, 09/15/18: NOrmal LVEF  . Paroxysmal atrial fibrillation ([place]) 06/15/2018  . Visit for monitoring Tikosyn therapy 06/12/2018   Past Surgical History:  Procedure Laterality Date  . CARDIOVERSION N/A 05/13/2018   Procedure: CARDIOVERSION;  Surgeon: [name], MD;  Location: [facility] ENDOSCOPY;  Service: Cardiovascular;  Laterality: N/A;  . CARDIOVERSION N/A 06/13/2018   Procedure: CARDIOVERSION;  Surgeon: [name], MD;  Location: [facility] ENDOSCOPY;  Service: Cardiovascular;  Laterality: N/A;  . CARDIOVERSION N/A 06/23/2018   Procedure: CARDIOVERSION;  Surgeon: [name], MD;  Location: [place];  Service: Cardiovascular;  Laterality: N/A;  . excision of actinic keratosis    . EYE SURGERY     eye lift  . TONSILLECTOMY    . WISDOM TOOTH EXTRACTION     Family History  Problem Relation Age of Onset  . Glaucoma Mother   . Atrial fibrillation Father     Social  History   Tobacco Use  . Smoking status: Never Smoker  . Smokeless tobacco: Never Used  Substance Use Topics  . Alcohol use: Yes    Comment: very little   Marital Status: Divorced  ROS  Review of Systems  Cardiovascular: Positive for palpitations. Negative for dyspnea on exertion and leg swelling.  Gastrointestinal: Negative for melena.   Objective  Blood pressure 131/89, pulse 64, temperature (!) 97.3 F (36.3 C), temperature source Temporal, resp. rate 15, height 5\' 7"  (1.702 m), weight 186 lb (84.4 kg), SpO2 97 %.  Vitals with BMI 12/21/2019 09/24/2019 09/24/2019  Height 5\' 7"  - 5\' 9"   Weight 186 lbs - 183 lbs  BMI A999333 - 0000000  Systolic A999333 0000000 123456  Diastolic 89 82 123456  Pulse 64 70 76     Physical Exam  Constitutional: He appears well-developed and well-nourished. No distress.  Cardiovascular: Normal rate and intact distal pulses. An irregularly irregular rhythm present. Exam reveals no gallop.  No murmur heard. S1 variable, S2 normal.   No JVD, No leg edema.    Pulmonary/Chest: Effort normal and breath sounds normal. No accessory muscle usage. No respiratory distress.  Abdominal: Soft.   Laboratory examination:   Recent Labs    03/24/19 0938  NA 139  K 5.2  CL 101  CO2 25  GLUCOSE 105*  BUN 20  CREATININE 1.12  CALCIUM 9.6  GFRNONAA 69  GFRAA 80   CrCl cannot be calculated (Patient's most recent lab result is older than  the maximum 21 days allowed.).  CMP Latest Ref Rng & Units 03/24/2019 06/15/2018 06/14/2018  Glucose 65 - 99 mg/dL 105(H) 101(H) 105(H)  BUN 8 - 27 mg/dL 20 16 15   Creatinine 0.76 - 1.27 mg/dL 1.12 1.14 1.11  Sodium 134 - 144 mmol/L 139 138 139  Potassium 3.5 - 5.2 mmol/L 5.2 3.8 4.2  Chloride 96 - 106 mmol/L 101 106 106  CO2 20 - 29 mmol/L 25 26 28   Calcium 8.6 - 10.2 mg/dL 9.6 8.8(L) 8.9  Total Protein 6.0 - 8.5 g/dL 6.3 - -  Total Bilirubin 0.0 - 1.2 mg/dL 0.7 - -  Alkaline Phos 39 - 117 IU/L 43 - -  AST 0 - 40 IU/L 14 - -  ALT 0 -  44 IU/L 25 - -   CBC Latest Ref Rng & Units 12/17/2019 03/24/2019  WBC 3.4 - 10.8 x10E3/uL 6.0 6.1  Hemoglobin 13.0 - 17.7 g/dL 15.3 14.4  Hematocrit 37.5 - 51.0 % 45.5 43.6  Platelets 150 - 450 x10E3/uL 220 211   Lipid Panel     Component Value Date/Time   CHOL 226 (H) 12/17/2019 0831   TRIG 180 (H) 12/17/2019 0831   HDL 39 (L) 12/17/2019 0831   LDLCALC 154 (H) 12/17/2019 0831   HEMOGLOBIN A1C No results found for: HGBA1C, MPG   TSH Recent Labs    12/17/19 0836  TSH 3.160   Medications and allergies  No Known Allergies   Current Outpatient Medications  Medication Instructions  . dofetilide (TIKOSYN) 500 mcg, Oral, 2 times daily  . Magnesium Citrate 200 mg, Oral, 2 times daily  . Magnesium Gluconate (MAGNESIUM 27) 500 (27 Mg) MG TABS 1 tablet, Oral, Daily  . metoprolol succinate (TOPROL-XL) 100 MG 24 hr tablet TAKE 1 TABLET BY MOUTH TWICE DAILY.  . rosuvastatin (CRESTOR) 10 mg, Oral, Daily  . valsartan (DIOVAN) 320 MG tablet TAKE ONE TABLET AT BEDTIME.  Marland Kitchen XARELTO 20 MG TABS tablet TAKE ONE TABLET IN THE EVENING AFTER DINNER.   Radiology:   No results found.  Cardiac Studies:   Nuclear stress test  05/05/2018: 1. The resting electrocardiogram demonstrated atrial fibrillation. The stress electrocardiogram was positive for ischemia with 2 mm downsloping ST depression in the inferior and lateral leads, persisted for 2 mintues into recovery. Stress symptoms included palpitations and fatigue. Patient exercised on Bruce protocol for 6:00 minutes and achieved 7.05 METS. Stress test terminated due to fatigue and 113% MPHR achieved (Target HR >85%). 2. Left ventricular cavity is noted to be normal on the rest and stress studies. SPECT images demonstrate homogeneous tracer distribution throughout the myocardium. The left ventricular ejection fraction was calculated to be 38%, but visually appears to be at least low normal without wall motion abnormality. This is an intermediate risk  study due to abnormal ST change and low LVEF (difficult in view of gating with A. Fib), clinical correlation recommended.  Echocardiogram 09/15/2018: Left ventricle cavity is normal in size. Mild concentric hypertrophy of the left ventricle. Normal global wall motion. Normal diastolic filling pattern. Calculated EF 69%. Left atrial cavity is mildly dilated. Moderate (Grade II) aortic regurgitation. Mild prolapse of the mitral valve leaflets. Moderate (Grade III) mitral regurgitation. Mild tricuspid regurgitation. Estimated pulmonary artery systolic pressure 24  mmHg. No significant change compared to previous study 04/23/2018 in MR and AI, EF improved from 45%.  EKG    03/23/2019: Marked sinus bradycardia with rate of 54 bpm, borderline left atrial abnormality, normal axis.  No evidence of  ischemia.  Normal QT interval.  Normal EKG. No significant change from  EKG 09/22/2018: Marked sinus bradycardia at the rate of 49 bpm.  Assessment     ICD-10-CM   1. Paroxysmal atrial fibrillation (HCC)  I48.0   2. Essential hypertension  I10   3. Pure hypercholesterolemia  E78.00 rosuvastatin (CRESTOR) 10 MG tablet     Meds ordered this encounter  Medications  . rosuvastatin (CRESTOR) 10 MG tablet    Sig: Take 1 tablet (10 mg total) by mouth daily.    Dispense:  90 tablet    Refill:  3    There are no discontinued medications.  Recommendations:   Dustin Schneider  is a 64 y.o. Caucasian male with paroxysmal atrial fibrillation, initially noted in September 2019 with an echocardiogram had revealed mild LV systolic dysfunction.  He was also evaluated by EP Dr. Thompson Grayer who recommended continued medical therapy.  He is presently on Tikosyn.  Last direct-current cardioversion was on 05/13/2018.  EF has normalized since maintaining sinus rhythm.  Past medical history significant for hypertension, hyperglycemia.  I seen him 3 months ago when he had recurrence of after having had 2 glasses of wine.   Since then he Has been abstinent from alcohol and is maintaining sinus rhythm by Auscultation. BP is controlled.   I discussed with him regarding Hyperlipidemia, he is willing to restart Crestor 10 mg daily, will recheck lipids in 2 months.  I'll see him back in 6 months follow-up.  [name], MD, [facility] 12/21/2019, 9:46 AM [facility] [place] Pager: [phone] Office: [phone]

## 2019-12-21 ENCOUNTER — Other Ambulatory Visit: Payer: Self-pay

## 2019-12-21 ENCOUNTER — Ambulatory Visit: Payer: BC Managed Care – PPO | Admitting: Cardiology

## 2019-12-21 ENCOUNTER — Encounter: Payer: Self-pay | Admitting: Cardiology

## 2019-12-21 VITALS — BP 131/89 | HR 64 | Temp 97.3°F | Resp 15 | Ht 67.0 in | Wt 186.0 lb

## 2019-12-21 DIAGNOSIS — E7800 Pure hypercholesterolemia, unspecified: Secondary | ICD-10-CM

## 2019-12-21 DIAGNOSIS — E782 Mixed hyperlipidemia: Secondary | ICD-10-CM

## 2019-12-21 DIAGNOSIS — I480 Paroxysmal atrial fibrillation: Secondary | ICD-10-CM

## 2019-12-21 DIAGNOSIS — I10 Essential (primary) hypertension: Secondary | ICD-10-CM

## 2019-12-21 MED ORDER — ROSUVASTATIN CALCIUM 10 MG PO TABS: 10.0000 mg | ORAL_TABLET | Freq: Every day | ORAL | 3 refills | Status: DC

## 2020-01-04 ENCOUNTER — Other Ambulatory Visit: Payer: Self-pay | Admitting: Cardiology

## 2020-01-04 DIAGNOSIS — I10 Essential (primary) hypertension: Secondary | ICD-10-CM

## 2020-01-09 LAB — COMPREHENSIVE METABOLIC PANEL
ALT: 51 IU/L — ABNORMAL HIGH (ref 0–44)
AST: 24 IU/L (ref 0–40)
Albumin/Globulin Ratio: 2.2 (ref 1.2–2.2)
Albumin: 4.4 g/dL (ref 3.8–4.8)
Alkaline Phosphatase: 57 IU/L (ref 39–117)
BUN/Creatinine Ratio: 21 (ref 10–24)
BUN: 21 mg/dL (ref 8–27)
Bilirubin Total: 0.7 mg/dL (ref 0.0–1.2)
CO2: 21 mmol/L (ref 20–29)
Calcium: 9.3 mg/dL (ref 8.6–10.2)
Chloride: 101 mmol/L (ref 96–106)
Creatinine, Ser: 1.01 mg/dL (ref 0.76–1.27)
GFR calc Af Amer: 90 mL/min/{1.73_m2} (ref >59)
GFR calc non Af Amer: 78 mL/min/{1.73_m2} (ref >59)
Globulin, Total: 2 g/dL (ref 1.5–4.5)
Glucose: 109 mg/dL — ABNORMAL HIGH (ref 65–99)
Potassium: 5.3 mmol/L — ABNORMAL HIGH (ref 3.5–5.2)
Sodium: 139 mmol/L (ref 134–144)
Total Protein: 6.4 g/dL (ref 6.0–8.5)

## 2020-01-09 LAB — SPECIMEN STATUS REPORT

## 2020-01-27 ENCOUNTER — Encounter (HOSPITAL_COMMUNITY): Payer: Self-pay | Admitting: Student in an Organized Health Care Education/Training Program

## 2020-01-27 ENCOUNTER — Encounter (HOSPITAL_COMMUNITY)
Admission: EM | Disposition: A | Discharge status: Rehabilitation Facility | Payer: Self-pay | Source: Home / Self Care | Attending: Neurology

## 2020-01-27 ENCOUNTER — Emergency Department (HOSPITAL_COMMUNITY): Payer: BC Managed Care – PPO | Admitting: Certified Registered Nurse Anesthetist

## 2020-01-27 ENCOUNTER — Emergency Department (HOSPITAL_COMMUNITY): Payer: BC Managed Care – PPO

## 2020-01-27 ENCOUNTER — Inpatient Hospital Stay (HOSPITAL_COMMUNITY): Payer: BC Managed Care – PPO

## 2020-01-27 ENCOUNTER — Inpatient Hospital Stay (HOSPITAL_COMMUNITY)
Admission: EM | Admit: 2020-01-27 | Discharge: 2020-02-02 | DRG: 023 | Disposition: A | Discharge status: Rehabilitation Facility | Payer: BC Managed Care – PPO | Attending: Neurology | Admitting: Neurology

## 2020-01-27 DIAGNOSIS — J9601 Acute respiratory failure with hypoxia: Secondary | ICD-10-CM | POA: Diagnosis not present

## 2020-01-27 DIAGNOSIS — J9602 Acute respiratory failure with hypercapnia: Secondary | ICD-10-CM | POA: Diagnosis not present

## 2020-01-27 DIAGNOSIS — I10 Essential (primary) hypertension: Secondary | ICD-10-CM | POA: Diagnosis present

## 2020-01-27 DIAGNOSIS — R402212 Coma scale, best verbal response, none, at arrival to emergency department: Secondary | ICD-10-CM | POA: Diagnosis present

## 2020-01-27 DIAGNOSIS — Z7901 Long term (current) use of anticoagulants: Secondary | ICD-10-CM | POA: Diagnosis not present

## 2020-01-27 DIAGNOSIS — I6349 Cerebral infarction due to embolism of other cerebral artery: Secondary | ICD-10-CM | POA: Diagnosis present

## 2020-01-27 DIAGNOSIS — R402142 Coma scale, eyes open, spontaneous, at arrival to emergency department: Secondary | ICD-10-CM | POA: Diagnosis present

## 2020-01-27 DIAGNOSIS — D696 Thrombocytopenia, unspecified: Secondary | ICD-10-CM | POA: Diagnosis not present

## 2020-01-27 DIAGNOSIS — I959 Hypotension, unspecified: Secondary | ICD-10-CM | POA: Diagnosis not present

## 2020-01-27 DIAGNOSIS — E8809 Other disorders of plasma-protein metabolism, not elsewhere classified: Secondary | ICD-10-CM | POA: Diagnosis not present

## 2020-01-27 DIAGNOSIS — G8110 Spastic hemiplegia affecting unspecified side: Secondary | ICD-10-CM | POA: Diagnosis not present

## 2020-01-27 DIAGNOSIS — Z83511 Family history of glaucoma: Secondary | ICD-10-CM | POA: Diagnosis not present

## 2020-01-27 DIAGNOSIS — I428 Other cardiomyopathies: Secondary | ICD-10-CM

## 2020-01-27 DIAGNOSIS — I952 Hypotension due to drugs: Secondary | ICD-10-CM | POA: Diagnosis not present

## 2020-01-27 DIAGNOSIS — J690 Pneumonitis due to inhalation of food and vomit: Secondary | ICD-10-CM | POA: Diagnosis present

## 2020-01-27 DIAGNOSIS — I69351 Hemiplegia and hemiparesis following cerebral infarction affecting right dominant side: Secondary | ICD-10-CM | POA: Diagnosis not present

## 2020-01-27 DIAGNOSIS — G8191 Hemiplegia, unspecified affecting right dominant side: Secondary | ICD-10-CM | POA: Diagnosis present

## 2020-01-27 DIAGNOSIS — G936 Cerebral edema: Secondary | ICD-10-CM | POA: Diagnosis not present

## 2020-01-27 DIAGNOSIS — Z8249 Family history of ischemic heart disease and other diseases of the circulatory system: Secondary | ICD-10-CM

## 2020-01-27 DIAGNOSIS — R112 Nausea with vomiting, unspecified: Secondary | ICD-10-CM | POA: Diagnosis present

## 2020-01-27 DIAGNOSIS — R791 Abnormal coagulation profile: Secondary | ICD-10-CM | POA: Diagnosis present

## 2020-01-27 DIAGNOSIS — D72829 Elevated white blood cell count, unspecified: Secondary | ICD-10-CM | POA: Diagnosis not present

## 2020-01-27 DIAGNOSIS — Z79899 Other long term (current) drug therapy: Secondary | ICD-10-CM | POA: Diagnosis not present

## 2020-01-27 DIAGNOSIS — R4182 Altered mental status, unspecified: Secondary | ICD-10-CM | POA: Diagnosis not present

## 2020-01-27 DIAGNOSIS — I639 Cerebral infarction, unspecified: Secondary | ICD-10-CM | POA: Diagnosis present

## 2020-01-27 DIAGNOSIS — Z978 Presence of other specified devices: Secondary | ICD-10-CM

## 2020-01-27 DIAGNOSIS — I340 Nonrheumatic mitral (valve) insufficiency: Secondary | ICD-10-CM | POA: Diagnosis not present

## 2020-01-27 DIAGNOSIS — I480 Paroxysmal atrial fibrillation: Secondary | ICD-10-CM | POA: Diagnosis present

## 2020-01-27 DIAGNOSIS — J9600 Acute respiratory failure, unspecified whether with hypoxia or hypercapnia: Secondary | ICD-10-CM | POA: Diagnosis not present

## 2020-01-27 DIAGNOSIS — R030 Elevated blood-pressure reading, without diagnosis of hypertension: Secondary | ICD-10-CM | POA: Diagnosis not present

## 2020-01-27 DIAGNOSIS — I69391 Dysphagia following cerebral infarction: Secondary | ICD-10-CM | POA: Diagnosis not present

## 2020-01-27 DIAGNOSIS — J95821 Acute postprocedural respiratory failure: Secondary | ICD-10-CM | POA: Diagnosis not present

## 2020-01-27 DIAGNOSIS — R739 Hyperglycemia, unspecified: Secondary | ICD-10-CM | POA: Diagnosis not present

## 2020-01-27 DIAGNOSIS — Q279 Congenital malformation of peripheral vascular system, unspecified: Secondary | ICD-10-CM

## 2020-01-27 DIAGNOSIS — R29726 NIHSS score 26: Secondary | ICD-10-CM | POA: Diagnosis present

## 2020-01-27 DIAGNOSIS — H518 Other specified disorders of binocular movement: Secondary | ICD-10-CM | POA: Diagnosis present

## 2020-01-27 DIAGNOSIS — R252 Cramp and spasm: Secondary | ICD-10-CM | POA: Diagnosis not present

## 2020-01-27 DIAGNOSIS — N179 Acute kidney failure, unspecified: Secondary | ICD-10-CM | POA: Diagnosis not present

## 2020-01-27 DIAGNOSIS — T45515A Adverse effect of anticoagulants, initial encounter: Secondary | ICD-10-CM | POA: Diagnosis present

## 2020-01-27 DIAGNOSIS — R1312 Dysphagia, oropharyngeal phase: Secondary | ICD-10-CM | POA: Diagnosis not present

## 2020-01-27 DIAGNOSIS — Z4659 Encounter for fitting and adjustment of other gastrointestinal appliance and device: Secondary | ICD-10-CM

## 2020-01-27 DIAGNOSIS — E46 Unspecified protein-calorie malnutrition: Secondary | ICD-10-CM | POA: Diagnosis not present

## 2020-01-27 DIAGNOSIS — R1310 Dysphagia, unspecified: Secondary | ICD-10-CM | POA: Diagnosis present

## 2020-01-27 DIAGNOSIS — Z8679 Personal history of other diseases of the circulatory system: Secondary | ICD-10-CM | POA: Diagnosis not present

## 2020-01-27 DIAGNOSIS — E785 Hyperlipidemia, unspecified: Secondary | ICD-10-CM | POA: Diagnosis present

## 2020-01-27 DIAGNOSIS — R402362 Coma scale, best motor response, obeys commands, at arrival to emergency department: Secondary | ICD-10-CM | POA: Diagnosis present

## 2020-01-27 DIAGNOSIS — I351 Nonrheumatic aortic (valve) insufficiency: Secondary | ICD-10-CM | POA: Diagnosis not present

## 2020-01-27 DIAGNOSIS — I341 Nonrheumatic mitral (valve) prolapse: Secondary | ICD-10-CM | POA: Diagnosis not present

## 2020-01-27 DIAGNOSIS — R4701 Aphasia: Secondary | ICD-10-CM | POA: Diagnosis present

## 2020-01-27 DIAGNOSIS — I69319 Unspecified symptoms and signs involving cognitive functions following cerebral infarction: Secondary | ICD-10-CM | POA: Diagnosis not present

## 2020-01-27 DIAGNOSIS — K5900 Constipation, unspecified: Secondary | ICD-10-CM | POA: Diagnosis not present

## 2020-01-27 DIAGNOSIS — R001 Bradycardia, unspecified: Secondary | ICD-10-CM | POA: Diagnosis not present

## 2020-01-27 DIAGNOSIS — I63412 Cerebral infarction due to embolism of left middle cerebral artery: Principal | ICD-10-CM | POA: Diagnosis present

## 2020-01-27 DIAGNOSIS — I63512 Cerebral infarction due to unspecified occlusion or stenosis of left middle cerebral artery: Secondary | ICD-10-CM | POA: Diagnosis not present

## 2020-01-27 DIAGNOSIS — E876 Hypokalemia: Secondary | ICD-10-CM | POA: Diagnosis present

## 2020-01-27 DIAGNOSIS — Z20822 Contact with and (suspected) exposure to covid-19: Secondary | ICD-10-CM | POA: Diagnosis present

## 2020-01-27 DIAGNOSIS — T17908A Unspecified foreign body in respiratory tract, part unspecified causing other injury, initial encounter: Secondary | ICD-10-CM | POA: Diagnosis not present

## 2020-01-27 DIAGNOSIS — R339 Retention of urine, unspecified: Secondary | ICD-10-CM | POA: Diagnosis not present

## 2020-01-27 HISTORY — PX: IR PERCUTANEOUS ART THROMBECTOMY/INFUSION INTRACRANIAL INC DIAG ANGIO: IMG6087

## 2020-01-27 HISTORY — PX: IR CT HEAD LTD: IMG2386

## 2020-01-27 HISTORY — PX: RADIOLOGY WITH ANESTHESIA: SHX6223

## 2020-01-27 LAB — POCT I-STAT 7, (LYTES, BLD GAS, ICA,H+H)
Acid-Base Excess: 0 mmol/L (ref 0.0–2.0)
Acid-base deficit: 2 mmol/L (ref 0.0–2.0)
Bicarbonate: 25.7 mmol/L (ref 20.0–28.0)
Bicarbonate: 21.7 mmol/L (ref 20.0–28.0)
Calcium, Ion: 1.22 mmol/L (ref 1.15–1.40)
Calcium, Ion: 1.16 mmol/L (ref 1.15–1.40)
HCT: 43 % (ref 39.0–52.0)
HCT: 38 % — ABNORMAL LOW (ref 39.0–52.0)
Hemoglobin: 14.6 g/dL (ref 13.0–17.0)
Hemoglobin: 12.9 g/dL — ABNORMAL LOW (ref 13.0–17.0)
O2 Saturation: 100 %
O2 Saturation: 100 %
Patient temperature: 98.4
Potassium: 4.1 mmol/L (ref 3.5–5.1)
Potassium: 4.1 mmol/L (ref 3.5–5.1)
Sodium: 139 mmol/L (ref 135–145)
Sodium: 139 mmol/L (ref 135–145)
TCO2: 27 mmol/L (ref 22–32)
TCO2: 23 mmol/L (ref 22–32)
pCO2 arterial: 53.2 mmHg — ABNORMAL HIGH (ref 32.0–48.0)
pCO2 arterial: 27 mmHg — ABNORMAL LOW (ref 32.0–48.0)
pH, Arterial: 7.292 — ABNORMAL LOW (ref 7.350–7.450)
pH, Arterial: 7.512 — ABNORMAL HIGH (ref 7.350–7.450)
pO2, Arterial: 402 mmHg — ABNORMAL HIGH (ref 83.0–108.0)
pO2, Arterial: 147 mmHg — ABNORMAL HIGH (ref 83.0–108.0)

## 2020-01-27 LAB — DIFFERENTIAL
Abs Immature Granulocytes: 0.02 10*3/uL (ref 0.00–0.07)
Basophils Absolute: 0.1 10*3/uL (ref 0.0–0.1)
Basophils Relative: 1 %
Eosinophils Absolute: 0.2 10*3/uL (ref 0.0–0.5)
Eosinophils Relative: 2 %
Immature Granulocytes: 0 %
Lymphocytes Relative: 39 %
Lymphs Abs: 3.9 10*3/uL (ref 0.7–4.0)
Monocytes Absolute: 0.8 10*3/uL (ref 0.1–1.0)
Monocytes Relative: 8 %
Neutro Abs: 4.9 10*3/uL (ref 1.7–7.7)
Neutrophils Relative %: 50 %

## 2020-01-27 LAB — COMPREHENSIVE METABOLIC PANEL
ALT: 37 U/L (ref 0–44)
AST: 23 U/L (ref 15–41)
Albumin: 4.1 g/dL (ref 3.5–5.0)
Alkaline Phosphatase: 45 U/L (ref 38–126)
Anion gap: 10 (ref 5–15)
BUN: 18 mg/dL (ref 8–23)
CO2: 23 mmol/L (ref 22–32)
Calcium: 9.2 mg/dL (ref 8.9–10.3)
Chloride: 107 mmol/L (ref 98–111)
Creatinine, Ser: 1.09 mg/dL (ref 0.61–1.24)
GFR calc Af Amer: >60 mL/min (ref >60)
GFR calc non Af Amer: >60 mL/min (ref >60)
Glucose, Bld: 124 mg/dL — ABNORMAL HIGH (ref 70–99)
Potassium: 3.6 mmol/L (ref 3.5–5.1)
Sodium: 140 mmol/L (ref 135–145)
Total Bilirubin: 1 mg/dL (ref 0.3–1.2)
Total Protein: 6.2 g/dL — ABNORMAL LOW (ref 6.5–8.1)

## 2020-01-27 LAB — PROTIME-INR
INR: 1 (ref 0.8–1.2)
Prothrombin Time: 13 seconds (ref 11.4–15.2)

## 2020-01-27 LAB — I-STAT CHEM 8, ED
BUN: 22 mg/dL (ref 8–23)
Calcium, Ion: 1.16 mmol/L (ref 1.15–1.40)
Chloride: 103 mmol/L (ref 98–111)
Creatinine, Ser: 0.9 mg/dL (ref 0.61–1.24)
Glucose, Bld: 121 mg/dL — ABNORMAL HIGH (ref 70–99)
HCT: 43 % (ref 39.0–52.0)
Hemoglobin: 14.6 g/dL (ref 13.0–17.0)
Potassium: 3.5 mmol/L (ref 3.5–5.1)
Sodium: 141 mmol/L (ref 135–145)
TCO2: 24 mmol/L (ref 22–32)

## 2020-01-27 LAB — CBG MONITORING, ED: Glucose-Capillary: 125 mg/dL — ABNORMAL HIGH (ref 70–99)

## 2020-01-27 LAB — HEPARIN LEVEL (UNFRACTIONATED): Heparin Unfractionated: 0.16 IU/mL — ABNORMAL LOW (ref 0.30–0.70)

## 2020-01-27 LAB — CBC
HCT: 43.4 % (ref 39.0–52.0)
Hemoglobin: 14.3 g/dL (ref 13.0–17.0)
MCH: 31.8 pg (ref 26.0–34.0)
MCHC: 32.9 g/dL (ref 30.0–36.0)
MCV: 96.7 fL (ref 80.0–100.0)
Platelets: 242 10*3/uL (ref 150–400)
RBC: 4.49 MIL/uL (ref 4.22–5.81)
RDW: 13.3 % (ref 11.5–15.5)
WBC: 9.9 10*3/uL (ref 4.0–10.5)
nRBC: 0 % (ref 0.0–0.2)

## 2020-01-27 LAB — APTT: aPTT: 27 seconds (ref 24–36)

## 2020-01-27 LAB — MRSA PCR SCREENING: MRSA by PCR: NEGATIVE

## 2020-01-27 LAB — SARS CORONAVIRUS 2 BY RT PCR (HOSPITAL ORDER, PERFORMED IN ~~LOC~~ HOSPITAL LAB): SARS Coronavirus 2: NEGATIVE

## 2020-01-27 LAB — HIV ANTIBODY (ROUTINE TESTING W REFLEX): HIV Screen 4th Generation wRfx: NONREACTIVE

## 2020-01-27 SURGERY — IR WITH ANESTHESIA
Anesthesia: General

## 2020-01-27 MED ORDER — PHENYLEPHRINE HCL-NACL 10-0.9 MG/250ML-% IV SOLN
INTRAVENOUS | Status: DC | PRN
Start: 2020-01-27 — End: 2020-01-27
  Administered 2020-01-27: 30 ug/min via INTRAVENOUS

## 2020-01-27 MED ORDER — PROPOFOL 500 MG/50ML IV EMUL
INTRAVENOUS | Status: DC | PRN
Start: 2020-01-27 — End: 2020-01-27
  Administered 2020-01-27: 75 ug/kg/min via INTRAVENOUS

## 2020-01-27 MED ORDER — SODIUM CHLORIDE 0.9 % IV SOLN: INTRAVENOUS | Status: DC | PRN

## 2020-01-27 MED ORDER — FENTANYL CITRATE (PF) 100 MCG/2ML IJ SOLN: 25.0000 ug | Freq: Once | INTRAMUSCULAR | Status: DC

## 2020-01-27 MED ORDER — TICAGRELOR 90 MG PO TABS
ORAL_TABLET | ORAL | Status: AC
  Filled 2020-01-27: qty 2

## 2020-01-27 MED ORDER — VERAPAMIL HCL 2.5 MG/ML IV SOLN
INTRAVENOUS | Status: AC
  Filled 2020-01-27: qty 2

## 2020-01-27 MED ORDER — TIROFIBAN HCL IN NACL 5-0.9 MG/100ML-% IV SOLN
INTRAVENOUS | Status: AC
  Filled 2020-01-27: qty 100

## 2020-01-27 MED ORDER — STROKE: EARLY STAGES OF RECOVERY BOOK
Freq: Once | Status: DC
  Filled 2020-01-27: qty 1

## 2020-01-27 MED ORDER — ASPIRIN 81 MG PO CHEW
CHEWABLE_TABLET | ORAL | Status: AC
  Filled 2020-01-27: qty 1

## 2020-01-27 MED ORDER — ACETAMINOPHEN 650 MG RE SUPP: 650.0000 mg | RECTAL | Status: DC | PRN

## 2020-01-27 MED ORDER — SODIUM CHLORIDE 0.9 % IV SOLN: INTRAVENOUS | Status: DC

## 2020-01-27 MED ORDER — FENTANYL CITRATE (PF) 100 MCG/2ML IJ SOLN: 25.0000 ug | INTRAMUSCULAR | Status: DC | PRN

## 2020-01-27 MED ORDER — ACETAMINOPHEN 325 MG PO TABS: 650.0000 mg | ORAL_TABLET | ORAL | Status: DC | PRN

## 2020-01-27 MED ORDER — DEXMEDETOMIDINE HCL IN NACL 400 MCG/100ML IV SOLN: 0.0000 ug/kg/h | INTRAVENOUS | Status: DC

## 2020-01-27 MED ORDER — EPTIFIBATIDE 20 MG/10ML IV SOLN
INTRAVENOUS | Status: AC
  Filled 2020-01-27: qty 10

## 2020-01-27 MED ORDER — CHLORHEXIDINE GLUCONATE CLOTH 2 % EX PADS
6.0000 | MEDICATED_PAD | Freq: Every day | CUTANEOUS | Status: DC
  Administered 2020-01-27 – 2020-02-01 (×5): 6 via TOPICAL

## 2020-01-27 MED ORDER — FENTANYL BOLUS VIA INFUSION
25.0000 ug | INTRAVENOUS | Status: DC | PRN
  Filled 2020-01-27: qty 25

## 2020-01-27 MED ORDER — FENTANYL CITRATE (PF) 250 MCG/5ML IJ SOLN
INTRAMUSCULAR | Status: DC | PRN
  Administered 2020-01-27: 100 ug via INTRAVENOUS

## 2020-01-27 MED ORDER — FENTANYL CITRATE (PF) 100 MCG/2ML IJ SOLN
INTRAMUSCULAR | Status: AC
  Filled 2020-01-27: qty 2

## 2020-01-27 MED ORDER — ORAL CARE MOUTH RINSE
15.0000 mL | OROMUCOSAL | Status: DC
  Administered 2020-01-27 – 2020-01-29 (×18): 15 mL via OROMUCOSAL

## 2020-01-27 MED ORDER — ROCURONIUM BROMIDE 10 MG/ML (PF) SYRINGE
PREFILLED_SYRINGE | INTRAVENOUS | Status: DC | PRN
  Administered 2020-01-27: 50 mg via INTRAVENOUS

## 2020-01-27 MED ORDER — POTASSIUM CHLORIDE 10 MEQ/100ML IV SOLN
10.0000 meq | INTRAVENOUS | Status: AC
  Administered 2020-01-27 (×4): 10 meq via INTRAVENOUS
  Filled 2020-01-27 (×4): qty 100

## 2020-01-27 MED ORDER — CLEVIDIPINE BUTYRATE 0.5 MG/ML IV EMUL
0.0000 mg/h | INTRAVENOUS | Status: DC
  Administered 2020-01-27: 1 mg/h via INTRAVENOUS
  Filled 2020-01-27: qty 50

## 2020-01-27 MED ORDER — FENTANYL CITRATE (PF) 100 MCG/2ML IJ SOLN
50.0000 ug | Freq: Once | INTRAMUSCULAR | Status: AC
  Administered 2020-01-27: 50 ug via INTRAVENOUS

## 2020-01-27 MED ORDER — PROPOFOL 10 MG/ML IV BOLUS
INTRAVENOUS | Status: DC | PRN
  Administered 2020-01-27: 150 mg via INTRAVENOUS

## 2020-01-27 MED ORDER — SODIUM CHLORIDE 0.9 % IV SOLN
INTRAVENOUS | Status: DC | PRN
  Administered 2020-01-27: 80 ug via INTRAVENOUS

## 2020-01-27 MED ORDER — DOCUSATE SODIUM 50 MG/5ML PO LIQD
100.0000 mg | Freq: Two times a day (BID) | ORAL | Status: DC
  Administered 2020-01-28 – 2020-02-01 (×7): 100 mg
  Filled 2020-01-27 (×7): qty 10

## 2020-01-27 MED ORDER — IOHEXOL 300 MG/ML  SOLN
150.0000 mL | Freq: Once | INTRAMUSCULAR | Status: AC | PRN
  Administered 2020-01-27: 30 mL via INTRA_ARTERIAL

## 2020-01-27 MED ORDER — CHLORHEXIDINE GLUCONATE 0.12% ORAL RINSE (MEDLINE KIT)
15.0000 mL | Freq: Two times a day (BID) | OROMUCOSAL | Status: DC
  Administered 2020-01-27 – 2020-02-02 (×11): 15 mL via OROMUCOSAL

## 2020-01-27 MED ORDER — SODIUM CHLORIDE 0.9% FLUSH
3.0000 mL | Freq: Once | INTRAVENOUS | Status: AC
  Administered 2020-01-27: 3 mL via INTRAVENOUS

## 2020-01-27 MED ORDER — PROPOFOL 1000 MG/100ML IV EMUL
0.0000 ug/kg/min | INTRAVENOUS | Status: DC
  Administered 2020-01-27 – 2020-01-28 (×3): 40 ug/kg/min via INTRAVENOUS
  Filled 2020-01-27 (×4): qty 100

## 2020-01-27 MED ORDER — FENTANYL 2500MCG IN NS 250ML (10MCG/ML) PREMIX INFUSION
25.0000 ug/h | INTRAVENOUS | Status: DC
  Administered 2020-01-27: 50 ug/h via INTRAVENOUS
  Filled 2020-01-27 (×2): qty 250

## 2020-01-27 MED ORDER — DOCUSATE SODIUM 50 MG/5ML PO LIQD
100.0000 mg | Freq: Two times a day (BID) | ORAL | Status: DC
  Filled 2020-01-27: qty 10

## 2020-01-27 MED ORDER — PHENYLEPHRINE HCL-NACL 10-0.9 MG/250ML-% IV SOLN
0.0000 ug/min | INTRAVENOUS | Status: DC
  Administered 2020-01-27: 20 ug/min via INTRAVENOUS
  Administered 2020-01-27: 55 ug/min via INTRAVENOUS
  Administered 2020-01-28: 50 ug/min via INTRAVENOUS
  Administered 2020-01-28: 55 ug/min via INTRAVENOUS
  Administered 2020-01-28: 50 ug/min via INTRAVENOUS
  Filled 2020-01-27 (×6): qty 250

## 2020-01-27 MED ORDER — SENNOSIDES-DOCUSATE SODIUM 8.6-50 MG PO TABS: 1.0000 | ORAL_TABLET | Freq: Every evening | ORAL | Status: DC | PRN

## 2020-01-27 MED ORDER — NITROGLYCERIN 1 MG/10 ML FOR IR/CATH LAB
INTRA_ARTERIAL | Status: AC
  Filled 2020-01-27: qty 10

## 2020-01-27 MED ORDER — ACETAMINOPHEN 160 MG/5ML PO SOLN
650.0000 mg | ORAL | Status: DC | PRN
  Administered 2020-01-30 (×2): 650 mg
  Filled 2020-01-27 (×2): qty 20.3

## 2020-01-27 MED ORDER — SUCCINYLCHOLINE CHLORIDE 20 MG/ML IJ SOLN
INTRAMUSCULAR | Status: DC | PRN
  Administered 2020-01-27: 140 mg via INTRAVENOUS

## 2020-01-27 MED ORDER — POLYETHYLENE GLYCOL 3350 17 G PO PACK
17.0000 g | PACK | Freq: Every day | ORAL | Status: DC
  Filled 2020-01-27: qty 1

## 2020-01-27 MED ORDER — CLOPIDOGREL BISULFATE 300 MG PO TABS
ORAL_TABLET | ORAL | Status: AC
  Filled 2020-01-27: qty 1

## 2020-01-27 MED ORDER — IOHEXOL 350 MG/ML SOLN
80.0000 mL | Freq: Once | INTRAVENOUS | Status: AC | PRN
  Administered 2020-01-27: 80 mL via INTRAVENOUS

## 2020-01-27 NOTE — Anesthesia Preprocedure Evaluation (Addendum)
Anesthesia Evaluation  Patient identified by MRN, date of birth, ID band Patient unresponsive    Reviewed: Allergy & Precautions, NPO status , Patient's Chart, lab work & pertinent test results, Unable to perform ROS - Chart review onlyPreop documentation limited or incomplete due to emergent nature of procedure.  Airway Mallampati: II  TM Distance: >3 FB Neck ROM: Full    Dental  (+) Teeth Intact, Dental Advisory Given   Pulmonary neg pulmonary ROS,     + decreased breath sounds      Cardiovascular hypertension, Pt. on home beta blockers and Pt. on medications Normal cardiovascular exam Rhythm:Regular Rate:Normal     Neuro/Psych CVA, Residual Symptoms    GI/Hepatic negative GI ROS, Neg liver ROS,   Endo/Other  negative endocrine ROS  Renal/GU negative Renal ROS     Musculoskeletal negative musculoskeletal ROS (+)   Abdominal   Peds  Hematology  (+) Blood dyscrasia (Xarelto), ,   Anesthesia Other Findings Day of surgery medications reviewed with the patient.  Reproductive/Obstetrics                             Anesthesia Physical Anesthesia Plan  ASA: IV and emergent  Anesthesia Plan: General   Post-op Pain Management:    Induction: Intravenous, Rapid sequence and Cricoid pressure planned  PONV Risk Score and Plan: 2 and Treatment may vary due to age or medical condition  Airway Management Planned: Oral ETT and Video Laryngoscope Planned  Additional Equipment:   Intra-op Plan:   Post-operative Plan: Post-operative intubation/ventilation  Informed Consent:     History available from chart only and Only emergency history available  Plan Discussed with: CRNA  Anesthesia Plan Comments: (Pre-op evaluation completed after induction of anesthesia due to emergent nature of the procedure.)       Anesthesia Quick Evaluation

## 2020-01-27 NOTE — ED Notes (Signed)
Son Raju Coppolino [phone]

## 2020-01-27 NOTE — Progress Notes (Signed)
SBP goals 120-140, last 2 pressures w/n 15 mins apart have been 539J systolic, despite titration down of propofol and fentanyl as pt tolerated.  Notified Dr. Debbrah Alar and took a verbal phone order for phenylephrine infusion.    Will continue to monitor

## 2020-01-27 NOTE — Procedures (Signed)
INTERVENTIONAL NEURORADIOLOGY BRIEF POSTPROCEDURE NOTE  DIAGNOSTIC CEREBRAL ANGIOGRAM MECHANICAL THROMBECTOMY  Attending: Dr. Pedro Earls  Assistant: None  Diagnosis: Distal left M1/MCA occlusion; distal left A3/ACA occlusion  Access site: RCFA  Access closure: Perclose Proglide  Anesthesia: General  Medication used: refer to anesthesia documentation.  Complications: None  Estimated blood loss: 100 mL  Specimen: None  Findings: Distal left M1/MCA occlusion; distal left A3/ACA occlusion.   One pass direct contact aspiration with complete recanalization of the MCA.  Two passes direct contact aspiration in the ACA with no recanalization; one pass stent retriever (90mm solitaire) with complete recanalization.  No embolus to new or distal territory. No bleed on post procedure flat panel CT.  The patient tolerated the procedure well without incident or complication and is in stable condition.   Plan: - Patient will remain intubated due to concern for aspiration. - SBP 120-140. - Bed rest 6 hours. - F/U MRI within 24h.

## 2020-01-27 NOTE — Progress Notes (Signed)
RT obtained ABG on pt with the following results and changes. Pts settings at time of gas 560/18/40%/+5. Elink MD changed pt order to 560/13/40%/+5 based on ABG results. RT will adjust and continue to monitor.   Results for AADAM, ZHEN (MRN [id number]) as of 01/27/2020 22:48  Ref. Range 01/27/2020 21:23  Sample type Unknown ARTERIAL  pH, Arterial Latest Ref Range: 7.35 - 7.45  7.512 (H)  pCO2 arterial Latest Ref Range: 32 - 48 mmHg 27.0 (L)  pO2, Arterial Latest Ref Range: 83 - 108 mmHg 147 (H)  TCO2 Latest Ref Range: 22 - 32 mmol/L 23  Acid-Base Excess Latest Ref Range: 0.0 - 2.0 mmol/L 0.0  Bicarbonate Latest Ref Range: 20.0 - 28.0 mmol/L 21.7  O2 Saturation Latest Units: % 100.0  Patient temperature Unknown 98.4 F  Collection site Unknown Brachial

## 2020-01-27 NOTE — H&P (Signed)
Neurology Consultation  CC: Aphasic and right-sided hemiplegia  History is obtained from: EMS  HPI: Dustin Schneider is a 64 y.o. male with history of paroxysmal A. fib on chronic Xarelto, nonischemic cardiomyopathy and hypertension.  Patient was at [facility] hardware today.  Patient was noted to come out of the bathroom and suddenly collapsed by bystanders.  EMS was called.  On arrival they noted that he had a left forced gaze and right hemiplegia.  Patient was also nauseous and vomiting.  At the bridge patient was able to follow commands such as closing his eyes and opening his eyes, making a fist.  Due to patient being on Xarelto he was unable to receive TPA.  CT head was immediately obtained which did not show a bleed.  CTA of head and neck was immediately obtained and showed a dense left MCA.  Son was called, explained the emergent IR procedure, risks and benefits and consent was given.  At that point patient was immediately brought to the interventional radiology suite for thrombectomy.  LKW: 1300 tpa given?: no, on Xarelto - last dose likely 6pm yesterday Premorbid modified Rankin scale (mRS): 0 NIH stroke scale: 19   Past Medical History:  Diagnosis Date  . Hypertension   . NICM (nonischemic cardiomyopathy) ([place]) 06/15/2018   04/23/18: EF 45%, 09/15/18: NOrmal LVEF  . Paroxysmal atrial fibrillation ([place]) 06/15/2018  . Visit for monitoring Tikosyn therapy 06/12/2018    Family History  Problem Relation Age of Onset  . Glaucoma Mother   . Atrial fibrillation Father    Social History:   reports that he has never smoked. He has never used smokeless tobacco. He reports current alcohol use. He reports that he does not use drugs.  Medications  Current Facility-Administered Medications:  .  aspirin 81 MG chewable tablet, , , ,  .  clopidogrel (PLAVIX) 300 MG tablet, , , ,  .  eptifibatide (INTEGRILIN) 20 MG/10ML injection, , , ,  .  nitroGLYCERIN 100 mcg/mL intra-arterial injection, , , ,   .  sodium chloride flush (NS) 0.9 % injection 3 mL, 3 mL, Intravenous, Once, Dykstra, Ellwood Dense, MD .  ticagrelor (BRILINTA) 90 MG tablet, , , ,  .  tirofiban (AGGRASTAT) 5-0.9 MG/100ML-% injection, , , ,  .  verapamil (ISOPTIN) 2.5 MG/ML injection, , , ,  .  verapamil (ISOPTIN) 2.5 MG/ML injection, , , ,   Current Outpatient Medications:  .  dofetilide (TIKOSYN) 500 MCG capsule, Take 1 capsule (500 mcg total) by mouth 2 (two) times daily., Disp: 180 capsule, Rfl: 3 .  Magnesium Citrate 200 MG TABS, Take 200 mg by mouth in the morning and at bedtime. , Disp: , Rfl:  .  Magnesium Gluconate (MAGNESIUM 27) 500 (27 Mg) MG TABS, Take 1 tablet by mouth daily., Disp: , Rfl:  .  metoprolol succinate (TOPROL-XL) 100 MG 24 hr tablet, TAKE 1 TABLET BY MOUTH TWICE DAILY., Disp: 180 tablet, Rfl: 1 .  rosuvastatin (CRESTOR) 10 MG tablet, Take 1 tablet (10 mg total) by mouth daily., Disp: 90 tablet, Rfl: 3 .  valsartan (DIOVAN) 320 MG tablet, TAKE ONE TABLET AT BEDTIME., Disp: 90 tablet, Rfl: 1 .  XARELTO 20 MG TABS tablet, TAKE ONE TABLET IN THE EVENING AFTER DINNER., Disp: 90 tablet, Rfl: 0  ROS:  Unable to obtain due to aphasia.   Exam: Current vital signs: BP (!) 149/93  Vital signs in last 24 hours: BP: (149)/(93) 149/93 (06/16 1340) Gen: Awake, alert HEENT: [place]  AT dry mucus membranes with some pink fluid on his beard, later vomited some pink Pepto bismol color looking fluid CVS: irregularly irregular Resp: rales scattered Ext: no edema NEUROLOGICAL Awake, alert, non verbal but follows simple commands. CN: PERRL. Left gaze pref with inabiity to cross midline, right lower face weakness Motor: RUE flaccid, minimal movement in RLE. Left UE and Left LE with full strength. Sensory: possible decrease on left to nox stim Coord: difficult to assess given mentation.  Labs I have reviewed labs in epic and the results pertinent to this consultation are:  CBC    Component Value Date/Time   WBC 9.9  01/27/2020 1341   RBC 4.49 01/27/2020 1341   HGB 14.6 01/27/2020 1346   HGB 15.3 12/17/2019 0831   HCT 43.0 01/27/2020 1346   HCT 45.5 12/17/2019 0831   PLT 242 01/27/2020 1341   PLT 220 12/17/2019 0831   MCV 96.7 01/27/2020 1341   MCV 94 12/17/2019 0831   MCH 31.8 01/27/2020 1341   MCHC 32.9 01/27/2020 1341   RDW 13.3 01/27/2020 1341   RDW 13.3 12/17/2019 0831   LYMPHSABS 3.9 01/27/2020 1341   MONOABS 0.8 01/27/2020 1341   EOSABS 0.2 01/27/2020 1341   BASOSABS 0.1 01/27/2020 1341    CMP     Component Value Date/Time   NA 141 01/27/2020 1346   NA 139 12/17/2019 0836   K 3.5 01/27/2020 1346   CL 103 01/27/2020 1346   CO2 23 01/27/2020 1341   GLUCOSE 121 (H) 01/27/2020 1346   BUN 22 01/27/2020 1346   BUN 21 12/17/2019 0836   CREATININE 0.90 01/27/2020 1346   CALCIUM 9.2 01/27/2020 1341   PROT 6.2 (L) 01/27/2020 1341   PROT 6.4 12/17/2019 0836   ALBUMIN 4.1 01/27/2020 1341   ALBUMIN 4.4 12/17/2019 0836   AST 23 01/27/2020 1341   ALT 37 01/27/2020 1341   ALKPHOS 45 01/27/2020 1341   BILITOT 1.0 01/27/2020 1341   BILITOT 0.7 12/17/2019 0836   GFRNONAA >60 01/27/2020 1341   GFRAA >60 01/27/2020 1341    Lipid Panel     Component Value Date/Time   CHOL 226 (H) 12/17/2019 0831   TRIG 180 (H) 12/17/2019 0831   HDL 39 (L) 12/17/2019 0831   LDLCALC 154 (H) 12/17/2019 0831     Imaging I have reviewed the images obtained:  CT-scan of the brain: Hyperdense left M2 branch compatible with acute thrombus.  No acute infarct or hemorrhage.  CTA of head/neck: distal left M1 occlusion. CTP not done due to LKW within 6h   -- Etta Quill PA-C [facility] [phone]  M-F  (9:00 am- 5:00 PM)  01/27/2020, 2:17 PM   Assessment:  64 year old male with paroxysmal A. fib on chronic Xarelto.  Presented to the hospital after bystanders saw patient fall at [facility] hardware.  On arrival patient had forced left gaze deviation, right hemiplegia, however was still able  to follow verbal commands.  CT head showed no acute bleed or hemorrhage.  However there was noted hyperdense left MCA compatible with acute thrombus.   Significant other believes that he did take Xarelto last night at approximately 6 PM thus he was not a TPA candidate.   Risk and benefits were discussed with son and permission was given to go forward with MT.   Patient was immediately brought back to interventional radiology for thrombectomy.  Plan:  Acute Ischemic Stroke Cerebral infarction due to occlusion of left middle cerebral artery  Acuity: Acute Current Suspected  Etiology: Atrial fibrillation Continue Evaluation:  -Admit to: ICU -Blood pressure control, per IR (if successful revascularization - SBP between 120-140) -MRI/ECHO/A1C/Lipid panel. -Hyperglycemia management per SSI to maintain glucose 140-180mg /dL. -PT/OT/ST therapies and recommendations when able  CNS  -Close neuro monitoring  Hemiplegia following cerebral infarction affecting right dominant side -PT OT  RESP Probable aspiration pneumonia secondary to multiple episodes of emesis -Will need chest x-ray -Hold off on antibiotics for now  CV Essential (primary) hypertension -Aggressive BP control, goal per IR after interventional procedure -Titrate oral agents  Hyperlipidemia, unspecified  - Statin for goal LDL < 70  Paroxysmal atrial fibrillation -Rate control   HEME -Monitor -transfuse for hgb < 7  Coagulopathy secondary to anticoagulation -monitor labs  ENDO No active issues  GI/GU No active issues  Fluid/Electrolyte Disorders Gently hydrate with normal saline - 75cc/h  ID Probable aspiration pneumonia - as above   Prophylaxis DVT: SCD GI: Protonix Bowel: Senokot  Diet: NPO until cleared by speech  Code Status: Full Code  Consent obtained from Son - Jazier Mcglamery over phone at [phone], as well as case discussed and images shown to [employer] (significant other), who  came to the hospital in person. Other family - sister and brother-in-law are expected from [place] (B-I-L is a retired Psychologist, sport and exercise).   THE FOLLOWING WERE PRESENT ON ADMISSION: Acute Ischemic Stroke, Hemiparesis, Hemiplegia, dysphagia Probable Aspiration Pneumonia Coagulopathy secondary to Xarelto at home Afib   Delays:  1. Multiple episodes of emesis on CT table 2. Locating family as he was at [facility] and no family was with him. He had a cellphone which was locked. Family was located after calling chart number and also calling via his phone with voice activation.   Attending Neurohospitalist Addendum Patient seen and examined with APP/Resident. Agree with the history and physical as documented above. Agree with the plan as documented, which I helped formulate. I have independently reviewed the chart, obtained history, review of systems and examined the patient.I have personally reviewed pertinent head/neck/spine imaging (CT/MRI). I have personally discussed case with EDP and NIR attendings and coordinated care and thrombectomy. Please feel free to call with any questions. --- Amie Portland, MD [facility] Pager: [phone]  If 7pm to 7am, please call on call as listed on AMION.   CRITICAL CARE ATTESTATION Performed by: Amie Portland, MD Total critical care time: 55 minutes Critical care time was exclusive of separately billable procedures and treating other patients and/or supervising APPs/Residents/Students Critical care was necessary to treat or prevent imminent or life-threatening deterioration due to acute ischemic stroke, possible aspiration pneumonia. This patient is critically ill and at significant risk for neurological worsening and/or death and care requires constant monitoring. Critical care was time spent personally by me on the following activities: development of treatment plan with patient and/or surrogate as well as nursing, discussions with consultants,  evaluation of patient's response to treatment, examination of patient, obtaining history from patient or surrogate, ordering and performing treatments and interventions, ordering and review of laboratory studies, ordering and review of radiographic studies, pulse oximetry, re-evaluation of patient's condition, participation in multidisciplinary rounds and medical decision making of high complexity in the care of this patient.

## 2020-01-27 NOTE — Consult Note (Addendum)
NAME:  Dustin Schneider, MRN:  [id number], DOB:  1956/05/11, LOS: 0 ADMISSION DATE:  01/27/2020, CONSULTATION DATE:  01/27/2020 REFERRING MD:  Dr. Rory Percy, CHIEF COMPLAINT:  Vent management    Brief History   64 yo male with PMH significant for Afib on Xarelto admitted with L MCA stroke s/p thrombectomy. Not a candidate for TPA due to chronic anticoagulation with Xarelto.   History of present illness   Patient presented to ED today with acute onset of right sided weakness, confusion, and aphasia. LKW: 1300. EMS was called and found the patient with left gaze deviation.   Initial VS: BP: 149/93.  NIHSS: 26. Emergent imaging revealed hyperdense left MCA sign.  He had episodes of emesis while imaging being obtained, ultimately intubated for IR.  Patient with distal left M1 occlusion and distal left ACA occlusion.  Reported complete recanalization of both vessels.   Past Medical History  HTN NICM pAfib  Significant Hospital Events   6/16: Admit 6/16: IR for thrombectomy, complete recanalization   Consults:  6/16 Neurology 6/16 PCCM  Procedures:  6/16: IR for thrombectomy of L M1 occlusion and distal L ACA occlusion  Significant Diagnostic Tests:  6/16 CTH  hyperdense left MCA sign.  Micro Data:  COVID Negative 6/16  Antimicrobials:    Interim history/subjective:  COVID Negative 6/16  Objective   Blood pressure (!) 149/93.       No intake or output data in the 24 hours ending 01/27/20 1611 There were no vitals filed for this visit.  Examination: General: Middle aged make lying in bed in no acute distress HEENT: ETT, MM pink/moist, PERRL, sclera non-icteric  Neuro: Sedated on vent  CV: s1s2 regular rate and rhythm, no murmur, rubs, or gallops,  PULM:  Bilateral rhonchi with slight ETT intolerance as seen with cough GI: soft, bowel sounds active in all 4 quadrants, non-tender, non-distended Extremities: warm/dry, no edema  Skin: no rashes or lesions  Resolved Hospital  Problem list     Assessment & Plan:  Acute ischemic stroke -Due to left acute occlusion of left middle cerebral artery  P: Management per neurology  Maintain neuro protective measures; eurothermia, euglycemia, eunatermia, normoxia Nutrition and bowel regiment   Blood pressure per IR, goal of SBP 120-140 MRI and ECHO pending  PT/OT/SLP when appropriate   Acute respiratory insufficiency  -Intubated for revascularization procedure per IR  Concern for aspiration  -Seen with vomiting episode x2 during CT scan, decision made to leave intubated post procedure  P: Continue ventilator support with lung protective strategies  Wean PEEP and FiO2 for sats greater than 90%. Head of bed elevated 30 degrees. Plateau pressures less than 30 cm H20.  Follow intermittent chest x-ray and ABG.   SAT/SBT as tolerated, mentation preclude extubation  Ensure adequate pulmonary hygiene  Follow cultures  VAP bundle in place  PAD protocol Trend CBC and fever curve closely for need of antibiotic therapy   Paroxysmal A-fib on chronic anticoagulation  -Home medications include Tikosyn, metoprolol, and Xarelto -Direct current cardioversion 05/13/2012 with improvement in EF seen  HX of hypertension Hx of NICM -Mild LV systolic dysfunction seen on ECHO 04/2018, normalized after since remaining in NSR P: Continue home medications Continuous telemetry  Resumption of anticoagulation per neurology  Repeat ECHO pending   Hyperlipidemia P: Continue home statin   Best practice:  Diet: NPO Pain/Anxiety/Delirium protocol (if indicated): PRNs VAP protocol (if indicated): N/A DVT prophylaxis: Resume home anticoagulation when appropriate  GI prophylaxis: PPI Glucose  control: SSI Mobility: Bedrest Code Status: Full Family Communication: Per primary  Disposition: ICU  Labs   CBC: Recent Labs  Lab 01/27/20 1341 01/27/20 1346  WBC 9.9  --   NEUTROABS 4.9  --   HGB 14.3 14.6  HCT 43.4 43.0  MCV 96.7   --   PLT 242  --     Basic Metabolic Panel: Recent Labs  Lab 01/27/20 1341 01/27/20 1346  NA 140 141  K 3.6 3.5  CL 107 103  CO2 23  --   GLUCOSE 124* 121*  BUN 18 22  CREATININE 1.09 0.90  CALCIUM 9.2  --    GFR: CrCl cannot be calculated (Unknown ideal weight.). Recent Labs  Lab 01/27/20 1341  WBC 9.9    Liver Function Tests: Recent Labs  Lab 01/27/20 1341  AST 23  ALT 37  ALKPHOS 45  BILITOT 1.0  PROT 6.2*  ALBUMIN 4.1   No results for input(s): LIPASE, AMYLASE in the last 168 hours. No results for input(s): AMMONIA in the last 168 hours.  ABG    Component Value Date/Time   TCO2 24 01/27/2020 1346     Coagulation Profile: Recent Labs  Lab 01/27/20 1341  INR 1.0    Cardiac Enzymes: No results for input(s): CKTOTAL, CKMB, CKMBINDEX, TROPONINI in the last 168 hours.  HbA1C: No results found for: HGBA1C  CBG: Recent Labs  Lab 01/27/20 1344  [place]*    Review of Systems:   Unable to gather secondary to intubation   Past Medical History  He,  has a past medical history of Hypertension, NICM (nonischemic cardiomyopathy) ([name]) (06/15/2018), Paroxysmal atrial fibrillation ([place]) (06/15/2018), and Visit for monitoring Tikosyn therapy (06/12/2018).   Surgical History    Past Surgical History:  Procedure Laterality Date  . CARDIOVERSION N/A 05/13/2018   Procedure: CARDIOVERSION;  Surgeon: [name], MD;  Location: [facility] ENDOSCOPY;  Service: Cardiovascular;  Laterality: N/A;  . CARDIOVERSION N/A 06/13/2018   Procedure: CARDIOVERSION;  Surgeon: [name], MD;  Location: [facility] ENDOSCOPY;  Service: Cardiovascular;  Laterality: N/A;  . CARDIOVERSION N/A 06/23/2018   Procedure: CARDIOVERSION;  Surgeon: [name], MD;  Location: [facility];  Service: Cardiovascular;  Laterality: N/A;  . excision of actinic keratosis    . EYE SURGERY     eye lift  . IR PERCUTANEOUS ART THROMBECTOMY/INFUSION INTRACRANIAL INC DIAG ANGIO  01/27/2020      .  TONSILLECTOMY    . WISDOM TOOTH EXTRACTION       Social History   reports that he has never smoked. He has never used smokeless tobacco. He reports current alcohol use. He reports that he does not use drugs.   Family History   His family history includes Atrial fibrillation in his father; Glaucoma in his mother.   Allergies No Known Allergies   Home Medications  Prior to Admission medications   Medication Sig Start Date End Date Taking? Authorizing Provider  dofetilide (TIKOSYN) 500 MCG capsule Take 1 capsule (500 mcg total) by mouth 2 (two) times daily. 08/20/19   [name], MD  Magnesium Citrate 200 MG TABS Take 200 mg by mouth in the morning and at bedtime.     [provider]  Magnesium Gluconate (MAGNESIUM 27) 500 (27 Mg) MG TABS Take 1 tablet by mouth daily.    [provider]  metoprolol succinate (TOPROL-XL) 100 MG 24 hr tablet TAKE 1 TABLET BY MOUTH TWICE DAILY. 01/05/20   [name], MD  rosuvastatin ([place])  10 MG tablet Take 1 tablet (10 mg total) by mouth daily. 12/21/19 03/20/20  [name], MD  valsartan (DIOVAN) 320 MG tablet TAKE ONE TABLET AT BEDTIME. 01/05/20   [name], MD  XARELTO 20 MG TABS tablet TAKE ONE TABLET IN THE EVENING AFTER DINNER. 11/24/19   [name], MD     Critical care time:    Performed by: Johnsie Cancel  Total critical care time: 45 minutes  Critical care time was exclusive of separately billable procedures and treating other patients.  Critical care was necessary to treat or prevent imminent or life-threatening deterioration.  Critical care was time spent personally by me on the following activities: development of treatment plan with patient and/or surrogate as well as nursing, discussions with consultants, evaluation of patient's response to treatment, examination of patient, obtaining history from patient or surrogate, ordering and performing treatments and interventions, ordering and review of laboratory studies, ordering  and review of radiographic studies, pulse oximetry and re-evaluation of patient's condition.  Johnsie Cancel, NP-C [facility] Contact / Pager information can be found on Amion  01/27/2020, 4:39 PM

## 2020-01-27 NOTE — Code Documentation (Signed)
64 yo male coming from [employer] where he was LKW at 1300 per staff. Reported that patient walked in and went to the bathroom. After patient came out of the bathroom, pt had a sudden fall and right side was noted to be weak. [facility] called and activated a Code Stroke.   Stroke Team met patient upon arrival to the ED. Initial NIHSS 26 due to left gaze, right hemianopia, right sided arm and leg weakness, and aphasia. Labs drawn and EDP cleared airway. Upon arrival to CT, patient vomited. 4 mg of Zofran given. CT Head completed and showed dense M1 per Dr. Rory Percy. IR activated at 1352. Pt vomited again. CTA completed.   Patient undressed. Placed in a gown. Taken to [employer] 8. Anesthesia at the bedside. Pulses marked. Pt intubated and transferred to IR Suite at 1419.   Handoff given to Marylyn Ishihara, Therapist, sports. Plan to admit to [facility] ICU.

## 2020-01-27 NOTE — Progress Notes (Signed)
eLink Physician-Brief Progress Note Patient Name: Dustin Schneider DOB: Jul 05, 1956 MRN: [id number]   Date of Service  01/27/2020  HPI/Events of Note  ABG on 40%/PRVC 18/P 5 = 7.512/27/147/21.7  eICU Interventions  Plan: 1. Decrease PRVC rate to 13.  2. Repeat ABG at 10:30 PM.     Intervention Category Major Interventions: Respiratory failure - evaluation and management;Acid-Base disturbance - evaluation and management  Kyan Yurkovich [place] 01/27/2020, 9:30 PM

## 2020-01-27 NOTE — Sedation Documentation (Signed)
Patient transported to unit. Amy RN at the bedside. Assessed groin site. Pink, warm and dry. No drainage noted. No hematoma. Soft upon palpation. Doppler bilateral DP/PT pulses.

## 2020-01-27 NOTE — Sedation Documentation (Signed)
Report given to amy RN. Will be transporting shortly to 4 N with respiratory/anesthesia due to intubation

## 2020-01-27 NOTE — Anesthesia Procedure Notes (Signed)
Procedure Name: Intubation Date/Time: 01/27/2020 2:16 PM Performed by: Colin Benton, CRNA Pre-anesthesia Checklist: Patient identified, Emergency Drugs available, Suction available and Patient being monitored Patient Re-evaluated:Patient Re-evaluated prior to induction Oxygen Delivery Method: Circle system utilized Preoxygenation: Pre-oxygenation with 100% oxygen Induction Type: IV induction and Rapid sequence Laryngoscope Size: Glidescope and 4 Grade View: Grade I Tube type: Oral Tube size: 7.5 mm Number of attempts: 1 Airway Equipment and Method: Rigid stylet and Video-laryngoscopy Placement Confirmation: ETT inserted through vocal cords under direct vision and positive ETCO2 Secured at: 23 cm Tube secured with: Tape Dental Injury: Teeth and Oropharynx as per pre-operative assessment

## 2020-01-27 NOTE — Transfer of Care (Signed)
Immediate Anesthesia Transfer of Care Note  Patient: Dustin Schneider  Procedure(s) Performed: IR WITH ANESTHESIA (N/A )  Patient Location: ICU  Anesthesia Type:General  Level of Consciousness: sedated, unresponsive and Patient remains intubated per anesthesia plan  Airway & Oxygen Therapy: Patient placed on Ventilator (see vital sign flow sheet for setting)  Post-op Assessment: Report given to RN and Post -op Vital signs reviewed and stable  Post vital signs: Reviewed and stable  Last Vitals:  Vitals Value Taken Time  BP 118/77 01/27/20 1830  Temp    Pulse 53 01/27/20 1840  Resp 18 01/27/20 1840  SpO2 100 % 01/27/20 1840  Vitals shown include unvalidated device data.  Last Pain:  Vitals:   01/27/20 1630  TempSrc: Axillary         Complications: No complications documented.

## 2020-01-27 NOTE — ED Provider Notes (Signed)
[place] EMERGENCY DEPARTMENT Provider Note   CSN: [id number] Arrival date & time: 01/27/20  1338     History No chief complaint on file.   Dustin Schneider is a 64 y.o. male.  History limited due to patient's altered mental status, level 5 caveat.  Per EMS report patient had left gaze deviation, right hemiplegia, nauseous and vomiting.  Suddenly unresponsive at [facility] hardware today.  Hypertension, nonischemic cardiomyopathy, paroxysmal atrial fibrillation on chronic Xarelto therapy.  Per significant other, Eddie, pt normally takes in evening. She was unaware of any medical issue today.   HPI     Past Medical History:  Diagnosis Date  . Hypertension   . NICM (nonischemic cardiomyopathy) ([place]) 06/15/2018   04/23/18: EF 45%, 09/15/18: NOrmal LVEF  . Paroxysmal atrial fibrillation ([place]) 06/15/2018  . Visit for monitoring Tikosyn therapy 06/12/2018    Patient Active Problem List   Diagnosis Date Noted  . Paroxysmal atrial fibrillation ([place]) 06/15/2018  . Essential hypertension 06/15/2018  . Visit for monitoring Tikosyn therapy 06/12/2018    Past Surgical History:  Procedure Laterality Date  . CARDIOVERSION N/A 05/13/2018   Procedure: CARDIOVERSION;  Surgeon: [name], MD;  Location: [facility] ENDOSCOPY;  Service: Cardiovascular;  Laterality: N/A;  . CARDIOVERSION N/A 06/13/2018   Procedure: CARDIOVERSION;  Surgeon: [name], MD;  Location: [facility] ENDOSCOPY;  Service: Cardiovascular;  Laterality: N/A;  . CARDIOVERSION N/A 06/23/2018   Procedure: CARDIOVERSION;  Surgeon: [name], MD;  Location: [place];  Service: Cardiovascular;  Laterality: N/A;  . excision of actinic keratosis    . EYE SURGERY     eye lift  . TONSILLECTOMY    . WISDOM TOOTH EXTRACTION         Family History  Problem Relation Age of Onset  . Glaucoma Mother   . Atrial fibrillation Father     Social History   Tobacco Use  . Smoking status: Never Smoker  . Smokeless  tobacco: Never Used  Vaping Use  . Vaping Use: Never used  Substance Use Topics  . Alcohol use: Yes    Comment: very little  . Drug use: No    Home Medications Prior to Admission medications   Medication Sig Start Date End Date Taking? Authorizing Provider  dofetilide (TIKOSYN) 500 MCG capsule Take 1 capsule (500 mcg total) by mouth 2 (two) times daily. 08/20/19   [name], MD  Magnesium Citrate 200 MG TABS Take 200 mg by mouth in the morning and at bedtime.     [provider]  Magnesium Gluconate (MAGNESIUM 27) 500 (27 Mg) MG TABS Take 1 tablet by mouth daily.    [provider]  metoprolol succinate (TOPROL-XL) 100 MG 24 hr tablet TAKE 1 TABLET BY MOUTH TWICE DAILY. 01/05/20   [name], MD  rosuvastatin (CRESTOR) 10 MG tablet Take 1 tablet (10 mg total) by mouth daily. 12/21/19 03/20/20  [name], MD  valsartan (DIOVAN) 320 MG tablet TAKE ONE TABLET AT BEDTIME. 01/05/20   [name], MD  XARELTO 20 MG TABS tablet TAKE ONE TABLET IN THE EVENING AFTER DINNER. 11/24/19   [name], MD    Allergies    Patient has no known allergies.  Review of Systems   Review of Systems  Unable to perform ROS: Mental status change    Physical Exam Updated Vital Signs BP (!) (P) 149/93   Physical Exam Vitals and nursing note reviewed.  Constitutional:      Comments: Alert, confused  HENT:     Head: Normocephalic and atraumatic.     Nose: Nose normal.  Eyes:     Pupils: Pupils are equal, round, and reactive to light.     Comments: Left gaze deviation  Cardiovascular:     Rate and Rhythm: Normal rate.     Pulses: Normal pulses.  Pulmonary:     Effort: Pulmonary effort is normal. No respiratory distress.  Abdominal:     General: Abdomen is flat.     Tenderness: There is no abdominal tenderness. There is no guarding.  Musculoskeletal:        General: No deformity or signs of injury.     Cervical back: No rigidity or tenderness.  Skin:    General: Skin is warm and  dry.  Neurological:     Comments: Open eyes spontaneously, confused verbal response/aphasia, right sided hemiplegia, right sided neglect     ED Results / Procedures / Treatments   Labs (all labs ordered are listed, but only abnormal results are displayed) Labs Reviewed  COMPREHENSIVE METABOLIC PANEL - Abnormal; Notable for the following components:      Result Value   Glucose, Bld 124 (*)    Total Protein 6.2 (*)    All other components within normal limits  I-STAT CHEM 8, ED - Abnormal; Notable for the following components:   Glucose, Bld 121 (*)    All other components within normal limits  CBG MONITORING, ED - Abnormal; Notable for the following components:   Glucose-Capillary 125 (*)    All other components within normal limits  SARS CORONAVIRUS 2 BY RT PCR (HOSPITAL ORDER, [place] [facility])  PROTIME-INR  APTT  CBC  DIFFERENTIAL  HEPARIN LEVEL (UNFRACTIONATED)  HIV ANTIBODY (ROUTINE TESTING W REFLEX)    EKG None  Radiology CT HEAD CODE STROKE WO CONTRAST  Result Date: 01/27/2020 CLINICAL DATA:  Code stroke.  Stroke.  Last seen normal 1 hour ago. EXAM: CT HEAD WITHOUT CONTRAST TECHNIQUE: Contiguous axial images were obtained from the base of the skull through the vertex without intravenous contrast. COMPARISON:  None. FINDINGS: Brain: No evidence of acute infarction, hemorrhage, hydrocephalus, extra-axial collection or mass lesion/mass effect. Vascular: Hyperdense left M2 segment in the sylvian fissure compatible with acute thrombus. No other hyperdense vessel. Skull: Negative Sinuses/Orbits: Retention cyst left maxillary sinus. Negative orbit. Other: None ASPECTS ([place] Stroke Program Early CT Score) - Ganglionic level infarction (caudate, lentiform nuclei, internal capsule, insula, M1-M3 cortex): 7 - Supraganglionic infarction (M4-M6 cortex): 3 Total score (0-10 with 10 being normal): 10 IMPRESSION: 1. Hyperdense left M2 branch compatible with acute  thrombus. No acute infarct or hemorrhage 2. ASPECTS is 10 3. These results were called by telephone at the time of interpretation on 01/27/2020 at 1:56 pm to provider Rory Percy , who verbally acknowledged these results. Electronically Signed   By: Franchot Gallo M.D.   On: 01/27/2020 13:57    Procedures .Critical Care Performed by: Lucrezia Starch, MD Authorized by: Lucrezia Starch, MD   Critical care provider statement:    Critical care time (minutes):  31   Critical care was time spent personally by me on the following activities:  Discussions with consultants, evaluation of patient's response to treatment, examination of patient, ordering and performing treatments and interventions, ordering and review of laboratory studies, ordering and review of radiographic studies, pulse oximetry, re-evaluation of patient's condition, obtaining history from patient or surrogate and review of old charts   (including critical care time)  Medications Ordered in ED Medications  sodium chloride flush (NS) 0.9 % injection 3 mL (has no administration in time range)  tirofiban (AGGRASTAT) 5-0.9 MG/100ML-% injection (has no administration in time range)  aspirin 81 MG chewable tablet (has no administration in time range)  verapamil (ISOPTIN) 2.5 MG/ML injection (has no administration in time range)  ticagrelor (BRILINTA) 90 MG tablet (has no administration in time range)  clopidogrel (PLAVIX) 300 MG tablet (has no administration in time range)  eptifibatide (INTEGRILIN) 20 MG/10ML injection (has no administration in time range)  nitroGLYCERIN 100 mcg/mL intra-arterial injection (has no administration in time range)  verapamil (ISOPTIN) 2.5 MG/ML injection (has no administration in time range)   stroke: mapping our early stages of recovery book (has no administration in time range)  0.9 %  sodium chloride infusion (has no administration in time range)  acetaminophen (TYLENOL) tablet 650 mg (has no  administration in time range)    Or  acetaminophen (TYLENOL) 160 MG/5ML solution 650 mg (has no administration in time range)    Or  acetaminophen (TYLENOL) suppository 650 mg (has no administration in time range)  senna-docusate (Senokot-S) tablet 1 tablet (has no administration in time range)  iohexol (OMNIPAQUE) 350 MG/ML injection 80 mL (80 mLs Intravenous Contrast Given 01/27/20 1415)    ED Course  I have reviewed the triage vital signs and the nursing notes.  Pertinent labs & imaging results that were available during my care of the patient were reviewed by me and considered in my medical decision making (see chart for details).    MDM Rules/Calculators/A&P                          64 year old male A. fib on Xarelto, NICM presents with acute onset right-sided weakness, confusion, aphasia.  Stroke alert per EMS.  On arrival, evaluated by neurology, myself.  Taken emergently to CT scanner.  CT head negative for bleed, demonstrated hyperdense left MCA sign.  Patient taken directly from CT scanner to interventional radiology. Protecting airway in ER for now. Anticipate will need intubation for procedure, per neuro, anesthesia will do once he gets to IR.  Vitals stable at present. Not TPA candidate due to use of AC. Patient's significant other/partner, Ludwig Clarks, was updated during ER stay and reportedly in route to hospital.  Dr. Rory Percy admitting.   Final Clinical Impression(s) / ED Diagnoses Final diagnoses:  Cerebrovascular accident (CVA), unspecified mechanism ([place])    Rx / DC Orders ED Discharge Orders    None       Lucrezia Starch, MD 01/27/20 1436

## 2020-01-28 ENCOUNTER — Inpatient Hospital Stay (HOSPITAL_COMMUNITY): Payer: BC Managed Care – PPO

## 2020-01-28 ENCOUNTER — Encounter (HOSPITAL_COMMUNITY): Payer: Self-pay | Admitting: Radiology

## 2020-01-28 DIAGNOSIS — I341 Nonrheumatic mitral (valve) prolapse: Secondary | ICD-10-CM

## 2020-01-28 DIAGNOSIS — I63412 Cerebral infarction due to embolism of left middle cerebral artery: Principal | ICD-10-CM

## 2020-01-28 DIAGNOSIS — J9600 Acute respiratory failure, unspecified whether with hypoxia or hypercapnia: Secondary | ICD-10-CM | POA: Diagnosis not present

## 2020-01-28 DIAGNOSIS — Z7901 Long term (current) use of anticoagulants: Secondary | ICD-10-CM

## 2020-01-28 DIAGNOSIS — I480 Paroxysmal atrial fibrillation: Secondary | ICD-10-CM

## 2020-01-28 DIAGNOSIS — T17908A Unspecified foreign body in respiratory tract, part unspecified causing other injury, initial encounter: Secondary | ICD-10-CM

## 2020-01-28 DIAGNOSIS — J9602 Acute respiratory failure with hypercapnia: Secondary | ICD-10-CM

## 2020-01-28 DIAGNOSIS — I351 Nonrheumatic aortic (valve) insufficiency: Secondary | ICD-10-CM

## 2020-01-28 DIAGNOSIS — J9601 Acute respiratory failure with hypoxia: Secondary | ICD-10-CM

## 2020-01-28 DIAGNOSIS — I340 Nonrheumatic mitral (valve) insufficiency: Secondary | ICD-10-CM

## 2020-01-28 LAB — ECHOCARDIOGRAM COMPLETE
Height: 69 in
Weight: 2962.98 oz

## 2020-01-28 LAB — POCT I-STAT 7, (LYTES, BLD GAS, ICA,H+H)
Acid-base deficit: 1 mmol/L (ref 0.0–2.0)
Bicarbonate: 22.4 mmol/L (ref 20.0–28.0)
Calcium, Ion: 1.16 mmol/L (ref 1.15–1.40)
HCT: 37 % — ABNORMAL LOW (ref 39.0–52.0)
Hemoglobin: 12.6 g/dL — ABNORMAL LOW (ref 13.0–17.0)
O2 Saturation: 100 %
Patient temperature: 99.1
Potassium: 3.9 mmol/L (ref 3.5–5.1)
Sodium: 141 mmol/L (ref 135–145)
TCO2: 23 mmol/L (ref 22–32)
pCO2 arterial: 32.7 mmHg (ref 32.0–48.0)
pH, Arterial: 7.445 (ref 7.350–7.450)
pO2, Arterial: 161 mmHg — ABNORMAL HIGH (ref 83.0–108.0)

## 2020-01-28 LAB — LIPID PANEL
Cholesterol: 125 mg/dL (ref 0–200)
HDL: 32 mg/dL — ABNORMAL LOW (ref >40)
LDL Cholesterol: 33 mg/dL (ref 0–99)
Total CHOL/HDL Ratio: 3.9 RATIO
Triglycerides: 298 mg/dL — ABNORMAL HIGH (ref <150)
VLDL: 60 mg/dL — ABNORMAL HIGH (ref 0–40)

## 2020-01-28 MED ORDER — PANTOPRAZOLE SODIUM 40 MG PO PACK
40.0000 mg | PACK | Freq: Every day | ORAL | Status: DC
  Administered 2020-01-30 – 2020-02-02 (×4): 40 mg
  Filled 2020-01-28 (×4): qty 20

## 2020-01-28 MED ORDER — FENTANYL CITRATE (PF) 100 MCG/2ML IJ SOLN: 50.0000 ug | INTRAMUSCULAR | Status: DC | PRN

## 2020-01-28 MED ORDER — ASPIRIN 300 MG RE SUPP: 300.0000 mg | Freq: Once | RECTAL | Status: DC

## 2020-01-28 MED ORDER — MIDAZOLAM HCL 2 MG/2ML IJ SOLN
1.0000 mg | INTRAMUSCULAR | Status: DC | PRN
  Administered 2020-01-28: 2 mg via INTRAVENOUS
  Filled 2020-01-28: qty 2

## 2020-01-28 MED ORDER — FENTANYL CITRATE (PF) 100 MCG/2ML IJ SOLN
100.0000 ug | Freq: Once | INTRAMUSCULAR | Status: DC
  Filled 2020-01-28: qty 2

## 2020-01-28 MED ORDER — MIDAZOLAM HCL (PF) 5 MG/ML IJ SOLN: 1.0000 mg | INTRAMUSCULAR | Status: DC | PRN

## 2020-01-28 MED ORDER — ROSUVASTATIN CALCIUM 5 MG PO TABS
10.0000 mg | ORAL_TABLET | Freq: Every day | ORAL | Status: DC
  Administered 2020-01-28 – 2020-02-02 (×5): 10 mg
  Filled 2020-01-28 (×5): qty 2

## 2020-01-28 MED ORDER — ASPIRIN 300 MG RE SUPP
300.0000 mg | Freq: Every day | RECTAL | Status: DC
  Administered 2020-01-29: 300 mg via RECTAL
  Filled 2020-01-28: qty 1

## 2020-01-28 MED ORDER — HEPARIN SODIUM (PORCINE) 5000 UNIT/ML IJ SOLN
5000.0000 [IU] | Freq: Three times a day (TID) | INTRAMUSCULAR | Status: DC
  Administered 2020-01-28 – 2020-02-02 (×16): 5000 [IU] via SUBCUTANEOUS
  Filled 2020-01-28 (×16): qty 1

## 2020-01-28 MED ORDER — ASPIRIN 325 MG PO TABS
325.0000 mg | ORAL_TABLET | Freq: Every day | ORAL | Status: DC
  Administered 2020-01-28 – 2020-02-02 (×5): 325 mg
  Filled 2020-01-28 (×5): qty 1

## 2020-01-28 NOTE — Progress Notes (Signed)
Echocardiogram 2D Echocardiogram has been performed.  Oneal Deputy Jawad Wiacek 01/28/2020, 11:18 AM

## 2020-01-28 NOTE — Progress Notes (Signed)
OT Cancellation Note  Patient Details Name: Dustin Schneider MRN: [id number] DOB: 09-Jun-1956   Cancelled Treatment:    Reason Eval/Treat Not Completed: Patient not medically ready.  Will reattempt.  Nilsa Nutting., OTR/L Acute Rehabilitation Services Pager [phone] Office [phone]   Lucille Passy M 01/28/2020, 11:17 AM

## 2020-01-28 NOTE — Progress Notes (Signed)
PT Cancellation Note  Patient Details Name: Dustin Schneider MRN: [id number] DOB: [birth year]   Cancelled Treatment:    Reason Eval/Treat Not Completed: Patient at procedure or test/unavailable;Patient not medically ready. PT will continue to f/u with pt acutely as available and appropriate.    Clearnce Sorrel [name] 01/28/2020, 9:34 AM

## 2020-01-28 NOTE — Progress Notes (Signed)
RT NOTES: Transported patient to MRI and back to room 4N32 on vent without complications.

## 2020-01-28 NOTE — Progress Notes (Addendum)
NAME:  Dustin Schneider, MRN:  [id number], DOB:  [birth year], LOS: 1 ADMISSION DATE:  01/27/2020, CONSULTATION DATE:  01/27/2020 REFERRING MD:  Dr. Rory Percy, CHIEF COMPLAINT:  Vent management    Brief History   64 yo male with PMH significant for Afib on Xarelto admitted with L MCA stroke s/p thrombectomy. Not a candidate for TPA due to chronic anticoagulation with Xarelto.   History of present illness   Patient presented to ED today with acute onset of right sided weakness, confusion, and aphasia. LKW: 1300. EMS was called and found the patient with left gaze deviation.   Initial VS: BP: 149/93.  NIHSS: 26. Emergent imaging revealed hyperdense left MCA sign.  He had episodes of emesis while imaging being obtained, ultimately intubated for IR.  Patient with distal left M1 occlusion and distal left ACA occlusion.  Reported complete recanalization of both vessels.   Past Medical History  HTN NICM pAfib  Significant Hospital Events   6/16: Admit 6/16: IR for thrombectomy, complete recanalization   Consults:  6/16 Neurology 6/16 PCCM  Procedures:  6/16: IR for thrombectomy of L M1 occlusion and distal L ACA occlusion  Significant Diagnostic Tests:  6/16 CTH > hyperdense left MCA sign. 6/17 MRI brain > pending   Micro Data:  COVID Negative 6/16  Antimicrobials:    Interim history/subjective:  Lying in bed able to open eyes to verbal stimuli, RN reports no acute events overnight   Objective   Blood pressure 138/85, pulse (!) 58, temperature 98.8 F (37.1 C), temperature source Oral, resp. rate 13, height 5\' 9"  (1.753 m), weight 84 kg, SpO2 100 %.    Vent Mode: PRVC FiO2 (%):  [40 %-100 %] 40 % Set Rate:  [12 bmp-18 bmp] 13 bmp Vt Set:  [560 mL] 560 mL PEEP:  [5 cmH20] 5 cmH20 Plateau Pressure:  [16 cmH20-17 cmH20] 16 cmH20   Intake/Output Summary (Last 24 hours) at 01/28/2020 0729 Last data filed at 01/28/2020 0700 Gross per 24 hour  Intake 3544.07 ml  Output 650 ml  Net  2894.07 ml   Filed Weights   01/27/20 1650  Weight: 84 kg    Examination: General: Middle aged male lying in bed in no acute distress HEENT: ETT, MM pink/moist, PERRL, sclera non-icteric  Neuro: Opens eyes to verbal stimuli, attempts to follow basic commands but unable to  CV: s1s2 regular rate and rhythm, no murmur, rubs, or gallops,  PULM:  Clear to ascultation bilaterally, no added breath sounds, no increased work of breathing GI: soft, bowel sounds active in all 4 quadrants, non-tender, non-distended Extremities: warm/dry, no edema  Skin: no rashes or lesions  Resolved Hospital Problem list     Assessment & Plan:  Acute ischemic stroke -Due to left acute occlusion of left middle cerebral artery  P: Management per neurology  Maintain neuro protective measures;  eurothermia, euglycemia, eunatermia, normoxia Nutrition and bowel regiment  Blood pressure per IR, goal 12-140 MRI brian this am prior to extubation  PT/OT/SLP evals once appropriate   Acute respiratory insufficiency  -Intubated for revascularization procedure per IR  Concern for aspiration  -Seen with vomiting episode x2 during CT scan, decision made to leave intubated post procedure  P: Patient needs MRI brain this AM will coordinate to have this completed prior to SBT If patients does well with SBT and mentation allows will extubate  For now continue ventilator support with lung protective strategies  Wean PEEP and FiO2 for sats greater than 90%. Head  of bed elevated 30 degrees. Plateau pressures less than 30 cm H20.  Follow intermittent chest x-ray and ABG.   SAT/SBT as tolerated, mentation preclude extubation  Ensure adequate pulmonary hygiene  VAP bundle in place  PAD protocol Continue to trend CBC and fever curve, no acute indications for antibiotics   Paroxysmal A-fib on chronic anticoagulation  -Home medications include Tikosyn, metoprolol, and Xarelto -Direct current cardioversion 05/13/2012 with  improvement in EF seen  HX of hypertension Hx of NICM -Mild LV systolic dysfunction seen on ECHO 04/2018, normalized after since remaining in NSR P: Resume home Tikosyn and Metoprolol when appropriate Defer resumption of anticoagulation to neuro  Continuous telemetry  Repeat ECHO pending   Hyperlipidemia P: Continue home statin   Best practice:  Diet: NPO Pain/Anxiety/Delirium protocol (if indicated): PRNs VAP protocol (if indicated): N/A DVT prophylaxis: Resume home anticoagulation when appropriate  GI prophylaxis: PPI Glucose control: SSI Mobility: Bedrest Code Status: Full Family Communication: Per primary  Disposition: ICU  Labs   CBC: Recent Labs  Lab 01/27/20 1341 01/27/20 1346 01/27/20 1729 01/27/20 2123 01/28/20 0043  WBC 9.9  --   --   --   --   NEUTROABS 4.9  --   --   --   --   HGB 14.3 14.6 14.6 12.9* 12.6*  HCT 43.4 43.0 43.0 38.0* 37.0*  MCV 96.7  --   --   --   --   PLT 242  --   --   --   --     Basic Metabolic Panel: Recent Labs  Lab 01/27/20 1341 01/27/20 1346 01/27/20 1729 01/27/20 2123 01/28/20 0043  NA 140 141 139 139 141  K 3.6 3.5 4.1 4.1 3.9  CL 107 103  --   --   --   CO2 23  --   --   --   --   GLUCOSE 124* 121*  --   --   --   BUN 18 22  --   --   --   CREATININE 1.09 0.90  --   --   --   CALCIUM 9.2  --   --   --   --    GFR: Estimated Creatinine Clearance: 81.8 mL/min (by C-G formula based on SCr of 0.9 mg/dL). Recent Labs  Lab 01/27/20 1341  WBC 9.9    Liver Function Tests: Recent Labs  Lab 01/27/20 1341  AST 23  ALT 37  ALKPHOS 45  BILITOT 1.0  PROT 6.2*  ALBUMIN 4.1   No results for input(s): LIPASE, AMYLASE in the last 168 hours. No results for input(s): AMMONIA in the last 168 hours.  ABG    Component Value Date/Time   PHART 7.445 01/28/2020 0043   PCO2ART 32.7 01/28/2020 0043   PO2ART 161 (H) 01/28/2020 0043   HCO3 22.4 01/28/2020 0043   TCO2 23 01/28/2020 0043   ACIDBASEDEF 1.0 01/28/2020  0043   O2SAT 100.0 01/28/2020 0043     Coagulation Profile: Recent Labs  Lab 01/27/20 1341  INR 1.0    Cardiac Enzymes: No results for input(s): CKTOTAL, CKMB, CKMBINDEX, TROPONINI in the last 168 hours.  HbA1C: No results found for: HGBA1C  CBG: Recent Labs  Lab 01/27/20 1344  GLUCAP 125*     Critical care time:    Performed by: Johnsie Cancel  Total critical care time: 37 minutes  Critical care time was exclusive of separately billable procedures and treating other patients.  Critical care was  necessary to treat or prevent imminent or life-threatening deterioration.  Critical care was time spent personally by me on the following activities: development of treatment plan with patient and/or surrogate as well as nursing, discussions with consultants, evaluation of patient's response to treatment, examination of patient, obtaining history from patient or surrogate, ordering and performing treatments and interventions, ordering and review of laboratory studies, ordering and review of radiographic studies, pulse oximetry and re-evaluation of patient's condition.  Johnsie Cancel, NP-C [facility] Contact / Pager information can be found on Amion  01/28/2020, 7:29 AM

## 2020-01-28 NOTE — Progress Notes (Signed)
RT obtained ABG on pt with the following results. No changes at this time. RT will continue to monitor.   Results for Dustin Schneider, Dustin Schneider (MRN [id number]) as of 01/28/2020 01:17  Ref. Range 01/28/2020 00:43  Sample type Unknown ARTERIAL  pH, Arterial Latest Ref Range: 7.35 - 7.45  7.445  pCO2 arterial Latest Ref Range: 32 - 48 mmHg 32.7  pO2, Arterial Latest Ref Range: 83 - 108 mmHg 161 (H)  TCO2 Latest Ref Range: 22 - 32 mmol/L 23  Acid-base deficit Latest Ref Range: 0.0 - 2.0 mmol/L 1.0  Bicarbonate Latest Ref Range: 20.0 - 28.0 mmol/L 22.4  O2 Saturation Latest Units: % 100.0  Patient temperature Unknown 99.1 F  Collection site Unknown Brachial

## 2020-01-28 NOTE — Progress Notes (Signed)
Addendum  Patient actually failed SBT AT time of my earlier exam I mistakenly assumed patient was on SBT but patient was in [place]  - no extubation 01/28/2020 - PRVC    SIGNATURE    Dr. Brand Males, M.D., F.C.C.P,  Pulmonary and Critical Care Medicine Staff Physician, [place] Director - Interstitial Lung Disease  Program  [facility] [place] [facility] [employer], [place], [address]  Pager: [phone], If no answer or between  15:00h - 7:00h: call [phone] Telephone: [phone]  3:50 PM 01/28/2020

## 2020-01-28 NOTE — Progress Notes (Signed)
Per Dr. Erlinda Hong, SBP goal <160 24 hrs post IR

## 2020-01-28 NOTE — Progress Notes (Signed)
STROKE TEAM PROGRESS NOTE   INTERVAL HISTORY Significant other and son at bedside, I also talked with pt brother (who is a MD) over the phone. Pt still intubated but eyes open, did not quite follow questions but able to move LUE spontaneously but flaccid on the right. MRI showed left BG and ACA infarcts, much smaller than expected. MRA unremarkable. Plan for weaning trials and possible extubation today.   Vitals:   01/28/20 0745 01/28/20 0800 01/28/20 0815 01/28/20 0900  BP: 129/81 128/78 99/69 133/83  Pulse: (!) 56 (!) 55 (!) 57 (!) 55  Resp: 13 13 13    Temp:      TempSrc:      SpO2: 100% 100% 100%   Weight:      Height:       CBC:  Recent Labs  Lab 01/27/20 1341 01/27/20 1346 01/27/20 2123 01/28/20 0043  WBC 9.9  --   --   --   NEUTROABS 4.9  --   --   --   HGB 14.3   < > 12.9* 12.6*  HCT 43.4   < > 38.0* 37.0*  MCV 96.7  --   --   --   PLT 242  --   --   --    < > = values in this interval not displayed.   Basic Metabolic Panel:  Recent Labs  Lab 01/27/20 1341 01/27/20 1341 01/27/20 1346 01/27/20 1729 01/27/20 2123 01/28/20 0043  NA 140   < > 141   < > 139 141  K 3.6   < > 3.5   < > 4.1 3.9  CL 107  --  103  --   --   --   CO2 23  --   --   --   --   --   GLUCOSE 124*  --  121*  --   --   --   BUN 18  --  22  --   --   --   CREATININE 1.09  --  0.90  --   --   --   CALCIUM 9.2  --   --   --   --   --    < > = values in this interval not displayed.   Lipid Panel:     Component Value Date/Time   CHOL 125 01/28/2020 0716   CHOL 226 (H) 12/17/2019 0831   TRIG 298 (H) 01/28/2020 0716   HDL 32 (L) 01/28/2020 0716   HDL 39 (L) 12/17/2019 0831   CHOLHDL 3.9 01/28/2020 0716   VLDL 60 (H) 01/28/2020 0716   LDLCALC 33 01/28/2020 0716   LDLCALC 154 (H) 12/17/2019 0831   HgbA1c: No results found for: HGBA1C Urine Drug Screen: No results found for: LABOPIA, COCAINSCRNUR, LABBENZ, AMPHETMU, THCU, LABBARB  Alcohol Level No results found for: ETH  IMAGING past 24  hours MR ANGIO HEAD WO CONTRAST  Result Date: 01/28/2020 CLINICAL DATA:  64 year old male code stroke presentation with left MCA occlusion, left ACA A3 occlusion. Endovascular reperfusion yesterday. EXAM: MRI HEAD WITHOUT CONTRAST MRA HEAD WITHOUT CONTRAST TECHNIQUE: Multiplanar, multiecho pulse sequences of the brain and surrounding structures were obtained without intravenous contrast. Angiographic images of the head were obtained using MRA technique without contrast. COMPARISON:  CTA head and neck yesterday. FINDINGS: MRI HEAD FINDINGS Brain: Dense restricted diffusion in the left caudate and ventral putamen, with associated cytotoxic edema and caudate swelling. Petechial hemorrhage (Heidelberg Classification 1A, series 8, image 48). There are  also a few punctate foci of restricted diffusion in the nearby left inferior frontal gyrus and left insula, such as on series 2, image 28. Superimposed gyriform restricted diffusion along the left cingulate, and also involving posterior left superior frontal gyrus and parietal lobe (series 2, image 44 and series 4, image 17. T2 and FLAIR hyperintensity with gyral edema. No hemorrhage. One or 2 punctate areas of contralateral right caudate restricted diffusion, most notably series 2, image 38. T2 and FLAIR hyperintensity with no hemorrhage or mass effect. No other restricted diffusion. Chronic appearing hemosiderin in right inferior frontal gyrus has associated dark T2 rim and punctate internal T2 hyperintensity compatible with a small cavernous venous vascular malformation. No developmental venous anomaly associated on CTA. No regional edema or mass effect. Trace subarachnoid hemorrhage would be difficult to exclude such as in the left occipital lobe sulci on series 7, image 14. Also questionable trace blood in the left temporal and occipital horns, versus subependymal chronic microhemorrhages there. No ventriculomegaly. No midline shift. Basilar cisterns remain normal.  Cervicomedullary junction and pituitary are within normal limits. Outside of the acute findings there are a few scattered areas of subcortical white matter T2 and FLAIR hyperintensity. The right basal ganglia, thalami, brainstem and cerebellum appear negative. Vascular: Major intracranial vascular flow voids are preserved. Skull and upper cervical spine: Negative visible cervical spine, bone marrow signal. Sinuses/Orbits: Negative orbits. Mild to moderate paranasal sinus mucosal thickening. Other: Intubated.  Fluid in the pharynx.  Mastoids remain clear. MRA HEAD FINDINGS Antegrade flow in the posterior circulation with mildly dominant left V4 segment and no distal vertebral stenosis. Patent PICA origins, vertebrobasilar junction, basilar artery, AICA origins, SCA and PCA origins. Tortuous left P1 and fetal type left PCA on that side. Small right posterior communicating artery. Bilateral PCA branches are within normal limits. Antegrade flow in both ICA siphons, the left appears dominant with dominant left ACA. No siphon stenosis. Patent carotid termini. Normal ophthalmic and posterior communicating artery origins. Patent MCA and ACA origins. Anterior communicating artery and visible ACA branches are within normal limits. Restored patency on the left. Left MCA M1 segment and left MCA trifurcation are patent without stenosis. Left MCA branches appear within normal limits. Right MCA bifurcates early without stenosis. Visible right MCA branches are within normal limits. IMPRESSION: 1. Negative intracranial MRA, patent left MCA and ACA. 2. Confluent left basal ganglia infarct with edema and mild swelling. Left cingulate and additional left superior frontal / superior perirolandic gyral restricted diffusion with mild cortical edema. A few punctate foci of restricted diffusion elsewhere including at the right cingulate. 3. Heidelberg Classification 1A petechial hemorrhage at the caudate. Difficult to exclude trace left  hemisphere SAH and IVH, repeat plain Head CT could evaluate further. No significant intracranial mass effect. 4. 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 Electronically Signed   By: Genevie Ann M.D.   On: 01/28/2020 09:45   MR BRAIN WO CONTRAST  Result Date: 01/28/2020 CLINICAL DATA:  64 year old male code stroke presentation with left MCA occlusion, left ACA A3 occlusion. Endovascular reperfusion yesterday. EXAM: MRI HEAD WITHOUT CONTRAST MRA HEAD WITHOUT CONTRAST TECHNIQUE: Multiplanar, multiecho pulse sequences of the brain and surrounding structures were obtained without intravenous contrast. Angiographic images of the head were obtained using MRA technique without contrast. COMPARISON:  CTA head and neck yesterday. FINDINGS: MRI HEAD FINDINGS Brain: Dense restricted diffusion in the left caudate and ventral putamen, with associated cytotoxic edema and caudate swelling. Petechial hemorrhage (Heidelberg Classification 1A, series  8, image 48). There are also a few punctate foci of restricted diffusion in the nearby left inferior frontal gyrus and left insula, such as on series 2, image 28. Superimposed gyriform restricted diffusion along the left cingulate, and also involving posterior left superior frontal gyrus and parietal lobe (series 2, image 44 and series 4, image 17. T2 and FLAIR hyperintensity with gyral edema. No hemorrhage. One or 2 punctate areas of contralateral right caudate restricted diffusion, most notably series 2, image 38. T2 and FLAIR hyperintensity with no hemorrhage or mass effect. No other restricted diffusion. Chronic appearing hemosiderin in right inferior frontal gyrus has associated dark T2 rim and punctate internal T2 hyperintensity compatible with a small cavernous venous vascular malformation. No developmental venous anomaly associated on CTA. No regional edema or mass effect. Trace subarachnoid hemorrhage would be difficult to exclude such as in the  left occipital lobe sulci on series 7, image 14. Also questionable trace blood in the left temporal and occipital horns, versus subependymal chronic microhemorrhages there. No ventriculomegaly. No midline shift. Basilar cisterns remain normal. Cervicomedullary junction and pituitary are within normal limits. Outside of the acute findings there are a few scattered areas of subcortical white matter T2 and FLAIR hyperintensity. The right basal ganglia, thalami, brainstem and cerebellum appear negative. Vascular: Major intracranial vascular flow voids are preserved. Skull and upper cervical spine: Negative visible cervical spine, bone marrow signal. Sinuses/Orbits: Negative orbits. Mild to moderate paranasal sinus mucosal thickening. Other: Intubated.  Fluid in the pharynx.  Mastoids remain clear. MRA HEAD FINDINGS Antegrade flow in the posterior circulation with mildly dominant left V4 segment and no distal vertebral stenosis. Patent PICA origins, vertebrobasilar junction, basilar artery, AICA origins, SCA and PCA origins. Tortuous left P1 and fetal type left PCA on that side. Small right posterior communicating artery. Bilateral PCA branches are within normal limits. Antegrade flow in both ICA siphons, the left appears dominant with dominant left ACA. No siphon stenosis. Patent carotid termini. Normal ophthalmic and posterior communicating artery origins. Patent MCA and ACA origins. Anterior communicating artery and visible ACA branches are within normal limits. Restored patency on the left. Left MCA M1 segment and left MCA trifurcation are patent without stenosis. Left MCA branches appear within normal limits. Right MCA bifurcates early without stenosis. Visible right MCA branches are within normal limits. IMPRESSION: 1. Negative intracranial MRA, patent left MCA and ACA. 2. Confluent left basal ganglia infarct with edema and mild swelling. Left cingulate and additional left superior frontal / superior perirolandic  gyral restricted diffusion with mild cortical edema. A few punctate foci of restricted diffusion elsewhere including at the right cingulate. 3. Heidelberg Classification 1A petechial hemorrhage at the caudate. Difficult to exclude trace left hemisphere SAH and IVH, repeat plain Head CT could evaluate further. No significant intracranial mass effect. 4. 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 Electronically Signed   By: Genevie Ann M.D.   On: 01/28/2020 09:45   DG CHEST PORT 1 VIEW  Result Date: 01/27/2020 CLINICAL DATA:  Stroke, intubated EXAM: PORTABLE CHEST 1 VIEW COMPARISON:  None. FINDINGS: Single frontal view of the chest demonstrates unremarkable cardiac silhouette. Ectasia of the thoracic aorta. Endotracheal tube overlies tracheal air column tip well above carina. Enteric catheter passes below diaphragm tip and side port project over gastric fundus. No airspace disease, effusion, or pneumothorax. No acute bony abnormalities. IMPRESSION: 1. Support devices as above. 2. No acute intrathoracic process. Electronically Signed   By: Randa Ngo M.D.   On:  01/27/2020 19:08   CT HEAD CODE STROKE WO CONTRAST  Result Date: 01/27/2020 CLINICAL DATA:  Code stroke.  Stroke.  Last seen normal 1 hour ago. EXAM: CT HEAD WITHOUT CONTRAST TECHNIQUE: Contiguous axial images were obtained from the base of the skull through the vertex without intravenous contrast. COMPARISON:  None. FINDINGS: Brain: No evidence of acute infarction, hemorrhage, hydrocephalus, extra-axial collection or mass lesion/mass effect. Vascular: Hyperdense left M2 segment in the sylvian fissure compatible with acute thrombus. No other hyperdense vessel. Skull: Negative Sinuses/Orbits: Retention cyst left maxillary sinus. Negative orbit. Other: None ASPECTS ([place] Stroke Program Early CT Score) - Ganglionic level infarction (caudate, lentiform nuclei, internal capsule, insula, M1-M3 cortex): 7 - Supraganglionic  infarction (M4-M6 cortex): 3 Total score (0-10 with 10 being normal): 10 IMPRESSION: 1. Hyperdense left M2 branch compatible with acute thrombus. No acute infarct or hemorrhage 2. ASPECTS is 10 3. These results were called by telephone at the time of interpretation on 01/27/2020 at 1:56 pm to provider Rory Percy , who verbally acknowledged these results. Electronically Signed   By: Franchot Gallo M.D.   On: 01/27/2020 13:57   CT ANGIO HEAD CODE STROKE  Result Date: 01/27/2020 CLINICAL DATA:  Stroke.  Aphasia. EXAM: CT ANGIOGRAPHY HEAD AND NECK TECHNIQUE: Multidetector CT imaging of the head and neck was performed using the standard protocol during bolus administration of intravenous contrast. Multiplanar CT image reconstructions and MIPs were obtained to evaluate the vascular anatomy. Carotid stenosis measurements (when applicable) are obtained utilizing NASCET criteria, using the distal internal carotid diameter as the denominator. CONTRAST:  80mL OMNIPAQUE IOHEXOL 350 MG/ML SOLN COMPARISON:  CT head 01/27/2020 FINDINGS: CTA NECK FINDINGS Aortic arch: 4 vessel arch. Left vertebral artery origin from the arch. Proximal great vessels widely patent. Minimal atherosclerotic disease in the aortic arch. Right carotid system: Right carotid widely patent without significant stenosis. Left carotid system: Mild atherosclerotic disease left carotid bifurcation without significant stenosis. Vertebral arteries: Both vertebral arteries patent to the basilar without significant stenosis. Skeleton: Cervical spondylosis.  No acute skeletal abnormality. Other neck: Negative for soft tissue mass or edema in the neck. Upper chest: Lung apices clear bilaterally. Review of the MIP images confirms the above findings CTA HEAD FINDINGS Anterior circulation: Mild atherosclerotic disease in the cavernous carotid bilaterally without significant stenosis. Occlusion of the left middle cerebral artery at the bifurcation. There is a small early  branch on the left MCA which extends to the parietal lobe. There is clot within this vessel. However there is occlusion of the majority of the superior and inferior divisions of the left MCA . Minimal collateral flow on delayed imaging. Both anterior cerebral arteries widely patent without stenosis. Right A1 segment is hypoplastic. Right middle cerebral artery widely patent without stenosis. Posterior circulation: Both vertebral arteries patent to the basilar. PICA patent bilaterally. Basilar widely patent. AICA, superior cerebellar, and posterior cerebral arteries patent bilaterally without stenosis. Fetal origin left posterior cerebral artery. Venous sinuses: Normal venous enhancement. Anatomic variants: None Review of the MIP images confirms the above findings IMPRESSION: 1. Acute occlusion left MCA bifurcation with little flow in the superior and inferior divisions left MCA. There is an early branch supplying a part of the left parietal lobe. Minimal collateral flow. 2. No other intracranial stenosis or occlusion. 3. No significant carotid or vertebral artery stenosis in the neck. 4. These results were called by telephone at the time of interpretation on 01/27/2020 at 2:41 pm to provider Rory Percy , who verbally acknowledged these results. Electronically  Signed   By: Franchot Gallo M.D.   On: 01/27/2020 14:41   CT ANGIO NECK CODE STROKE  Result Date: 01/27/2020 CLINICAL DATA:  Stroke.  Aphasia. EXAM: CT ANGIOGRAPHY HEAD AND NECK TECHNIQUE: Multidetector CT imaging of the head and neck was performed using the standard protocol during bolus administration of intravenous contrast. Multiplanar CT image reconstructions and MIPs were obtained to evaluate the vascular anatomy. Carotid stenosis measurements (when applicable) are obtained utilizing NASCET criteria, using the distal internal carotid diameter as the denominator. CONTRAST:  18mL OMNIPAQUE IOHEXOL 350 MG/ML SOLN COMPARISON:  CT head 01/27/2020 FINDINGS: CTA  NECK FINDINGS Aortic arch: 4 vessel arch. Left vertebral artery origin from the arch. Proximal great vessels widely patent. Minimal atherosclerotic disease in the aortic arch. Right carotid system: Right carotid widely patent without significant stenosis. Left carotid system: Mild atherosclerotic disease left carotid bifurcation without significant stenosis. Vertebral arteries: Both vertebral arteries patent to the basilar without significant stenosis. Skeleton: Cervical spondylosis.  No acute skeletal abnormality. Other neck: Negative for soft tissue mass or edema in the neck. Upper chest: Lung apices clear bilaterally. Review of the MIP images confirms the above findings CTA HEAD FINDINGS Anterior circulation: Mild atherosclerotic disease in the cavernous carotid bilaterally without significant stenosis. Occlusion of the left middle cerebral artery at the bifurcation. There is a small early branch on the left MCA which extends to the parietal lobe. There is clot within this vessel. However there is occlusion of the majority of the superior and inferior divisions of the left MCA . Minimal collateral flow on delayed imaging. Both anterior cerebral arteries widely patent without stenosis. Right A1 segment is hypoplastic. Right middle cerebral artery widely patent without stenosis. Posterior circulation: Both vertebral arteries patent to the basilar. PICA patent bilaterally. Basilar widely patent. AICA, superior cerebellar, and posterior cerebral arteries patent bilaterally without stenosis. Fetal origin left posterior cerebral artery. Venous sinuses: Normal venous enhancement. Anatomic variants: None Review of the MIP images confirms the above findings IMPRESSION: 1. Acute occlusion left MCA bifurcation with little flow in the superior and inferior divisions left MCA. There is an early branch supplying a part of the left parietal lobe. Minimal collateral flow. 2. No other intracranial stenosis or occlusion. 3. No  significant carotid or vertebral artery stenosis in the neck. 4. These results were called by telephone at the time of interpretation on 01/27/2020 at 2:41 pm to provider Rory Percy , who verbally acknowledged these results. Electronically Signed   By: Franchot Gallo M.D.   On: 01/27/2020 14:41    PHYSICAL EXAM  Temp:  [96.9 F (36.1 C)-99.1 F (37.3 C)] 99.1 F (37.3 C) (06/17 1200) Pulse Rate:  [38-64] 57 (06/17 1315) Resp:  [12-18] 13 (06/17 1315) BP: (99-162)/(59-104) 115/78 (06/17 1315) SpO2:  [98 %-100 %] 99 % (06/17 1315) FiO2 (%):  [40 %-100 %] 40 % (06/17 1100) Weight:  [84 kg] 84 kg (06/16 1650)  General - Well nourished, well developed, intubated off sedation.  Ophthalmologic - fundi not visualized due to noncooperation.  Cardiovascular - irregularly irregular heart rate and rhythm  Neuro - intubated off sedation, eyes open, but not quite following commands. With eye opening, eyes in left gaze preference position, but able to cross midline, right gaze incomplete. Able to blink to visual threat on the left but not right, PERRL, able to track on the left visual field. Corneal reflex present, gag and cough present. Breathing over the vent.  Facial symmetry not able to test due to  ET tube.  Tongue protrusion not cooperative. Left UE spontaneous movement able to against gravity, LLE withdraw at least 2/5 with pain. However, on pain stimulation, no movement of RUE and RLE. DTR 1+ and no babinski. Sensation, coordination not cooperative and gait not tested.   ASSESSMENT/PLAN Dustin Schneider is a 64 y.o. male with history of  paroxysmal A. fib on chronic Xarelto, nonischemic cardiomyopathy and hypertension presenting with L forced gaze and R hemiplegia with nausea and vomiting. Not a tPA candidate as on xarelto. Taken to IR for L MCA occlusion.  Stroke:   L MCA and ACA scattered infarcts s/p TICI3 revascularization of L MCA & A3, infarct embolic secondary to known AF on Xarelto  Code  Stroke CT head Hyperdense L M2. ASPECTS 10.     CTA head & neck L MCA bifurcation occlusion w/ little distal flow. Minimal collateral flow.  Cerebral angio distal L M1 occlusion, distal L A3 occlusion. TICI 3 revascularization MCA and ACA   Post IR CT no hemorrhage  MRI  L basal ganglia infarct w/ mild edema. L cingulate and L superior frontal lobe infarcts w/ mild cortical edema. Few punctate infarcts R cingulate. Petechial hemorrhage at caudate. Small R cavernous malformation.   MRA  L MCA and ACA open.   2D Echo pending  LDL 33  HgbA1c pending   SCDs for VTE prophylaxis  Xarelto daily prior to admission, now on aspirin 325 po. Will consider eliquis in 3-5 days post stroke  Therapy recommendations:  pending   Disposition:  pending   Acute Respiratory Failure Possible Aspiration  N&V in ED  Intubated for IR, left intubated following given possible aspiration  Off sedation  Weaning trial today, extubate as able  CXR unremarkable  WBC 9.9  CCM on board  Atrial Fibrillation  Home anticoagulation:  Xarelto (rivaroxaban) daily  . Home meds:  metoprolol 100 daily, tikosyn 500 bid . Not an AC at this time given post IR petechial hemorrhage.  . Now on ASA . Will consider eliquis in 3-5 days.    Hypertension  Home meds:  metoprolol 100 daily, valsartan 320 . BP goal 120-140 post IR for 24 hours  . On phenylephrine  . Long-term BP goal normotensive  Hyperlipidemia  Home meds:  crestor 10, resumed in hospital  LDL 33, goal < 70  Continue statin at discharge  Dysphagia . Secondary to stroke . NPO . Speech on board   Other Stroke Risk Factors  Advanced age  NICM w/ EF 45% PTA  Other Active Problems    Hospital day # 1  This patient is critically ill due to left MCA occlusion with stroke s/p EVT, afib on AC, respiratory failure with intubation and at significant risk of neurological worsening, death form recurrent stroke, hemorrhagic conversion,  seizure, heart failure, respiratory failure. This patient's care requires constant monitoring of vital signs, hemodynamics, respiratory and cardiac monitoring, review of multiple databases, neurological assessment, discussion with family, other specialists and medical decision making of high complexity. I spent 40 minutes of neurocritical care time in the care of this patient. I had long discussion with wife and son at bedside as well as pt MD brother over the phone, updated pt current condition, treatment plan and potential prognosis, and answered all the questions. They expressed understanding and appreciation.   Rosalin Hawking, MD PhD Stroke Neurology 01/28/2020 2:00 PM   To contact Stroke Continuity provider, please refer to [email or web address] After hours, contact General Neurology

## 2020-01-28 NOTE — Progress Notes (Signed)
Referring Physician(s): Dr. Rory Percy  Supervising Physician: Pedro Earls  Patient Status:  [facility] - In-pt  Chief Complaint: Code Stroke  Subjective: Remains intubated this AM, on propofol.  Unable to arouse.  Was able to undergo imaging studies this AM.  No family at bedside.   Allergies: Patient has no known allergies.  Medications: Prior to Admission medications   Medication Sig Start Date End Date Taking? Authorizing Provider  dofetilide (TIKOSYN) 500 MCG capsule Take 1 capsule (500 mcg total) by mouth 2 (two) times daily. 08/20/19  Yes [name], MD  MAGNESIUM CITRATE PO Take 250 mg by mouth in the morning and at bedtime.    Yes [provider]  metoprolol succinate (TOPROL-XL) 100 MG 24 hr tablet TAKE 1 TABLET BY MOUTH TWICE DAILY. Patient taking differently: Take 100 mg by mouth 2 (two) times daily.  01/05/20  Yes [name], MD  Potassium 99 MG TABS Take 99 mg by mouth daily.   Yes [provider]  rosuvastatin (CRESTOR) 10 MG tablet Take 1 tablet (10 mg total) by mouth daily. 12/21/19 03/20/20 Yes [name], MD  valsartan (DIOVAN) 320 MG tablet TAKE ONE TABLET AT BEDTIME. Patient taking differently: Take 320 mg by mouth at bedtime.  01/05/20  Yes [name], MD  XARELTO 20 MG TABS tablet TAKE ONE TABLET IN THE EVENING AFTER DINNER. Patient taking differently: Take 20 mg by mouth daily after supper.  11/24/19  Yes [name], MD     Vital Signs: BP 117/78   Pulse (!) 59   Temp 99.1 F (37.3 C) (Axillary)   Resp 14   Ht 5\' 9"  (1.753 m)   Wt 185 lb 3 oz (84 kg)   SpO2 100%   BMI 27.35 kg/m   Physical Exam  Intubated, sedated, does not arouse to voice Neuro exam: deferred due to level of sedation.  Groin: soft, no bruising, no evidence of hematoma or pseudoaneurysm. Pulse:  Distal R DP and PT pulses intact.   Imaging: MR ANGIO HEAD WO CONTRAST  Result Date: 01/28/2020 CLINICAL DATA:  64 year old male code stroke presentation  with left MCA occlusion, left ACA A3 occlusion. Endovascular reperfusion yesterday. EXAM: MRI HEAD WITHOUT CONTRAST MRA HEAD WITHOUT CONTRAST TECHNIQUE: Multiplanar, multiecho pulse sequences of the brain and surrounding structures were obtained without intravenous contrast. Angiographic images of the head were obtained using MRA technique without contrast. COMPARISON:  CTA head and neck yesterday. FINDINGS: MRI HEAD FINDINGS Brain: Dense restricted diffusion in the left caudate and ventral putamen, with associated cytotoxic edema and caudate swelling. Petechial hemorrhage (Heidelberg Classification 1A, series 8, image 48). There are also a few punctate foci of restricted diffusion in the nearby left inferior frontal gyrus and left insula, such as on series 2, image 28. Superimposed gyriform restricted diffusion along the left cingulate, and also involving posterior left superior frontal gyrus and parietal lobe (series 2, image 44 and series 4, image 17. T2 and FLAIR hyperintensity with gyral edema. No hemorrhage. One or 2 punctate areas of contralateral right caudate restricted diffusion, most notably series 2, image 38. T2 and FLAIR hyperintensity with no hemorrhage or mass effect. No other restricted diffusion. Chronic appearing hemosiderin in right inferior frontal gyrus has associated dark T2 rim and punctate internal T2 hyperintensity compatible with a small cavernous venous vascular malformation. No developmental venous anomaly associated on CTA. No regional edema or mass effect. Trace subarachnoid hemorrhage would be difficult to exclude such as in the left occipital  lobe sulci on series 7, image 14. Also questionable trace blood in the left temporal and occipital horns, versus subependymal chronic microhemorrhages there. No ventriculomegaly. No midline shift. Basilar cisterns remain normal. Cervicomedullary junction and pituitary are within normal limits. Outside of the acute findings there are a few  scattered areas of subcortical white matter T2 and FLAIR hyperintensity. The right basal ganglia, thalami, brainstem and cerebellum appear negative. Vascular: Major intracranial vascular flow voids are preserved. Skull and upper cervical spine: Negative visible cervical spine, bone marrow signal. Sinuses/Orbits: Negative orbits. Mild to moderate paranasal sinus mucosal thickening. Other: Intubated.  Fluid in the pharynx.  Mastoids remain clear. MRA HEAD FINDINGS Antegrade flow in the posterior circulation with mildly dominant left V4 segment and no distal vertebral stenosis. Patent PICA origins, vertebrobasilar junction, basilar artery, AICA origins, SCA and PCA origins. Tortuous left P1 and fetal type left PCA on that side. Small right posterior communicating artery. Bilateral PCA branches are within normal limits. Antegrade flow in both ICA siphons, the left appears dominant with dominant left ACA. No siphon stenosis. Patent carotid termini. Normal ophthalmic and posterior communicating artery origins. Patent MCA and ACA origins. Anterior communicating artery and visible ACA branches are within normal limits. Restored patency on the left. Left MCA M1 segment and left MCA trifurcation are patent without stenosis. Left MCA branches appear within normal limits. Right MCA bifurcates early without stenosis. Visible right MCA branches are within normal limits. IMPRESSION: 1. Negative intracranial MRA, patent left MCA and ACA. 2. Confluent left basal ganglia infarct with edema and mild swelling. Left cingulate and additional left superior frontal / superior perirolandic gyral restricted diffusion with mild cortical edema. A few punctate foci of restricted diffusion elsewhere including at the right cingulate. 3. Heidelberg Classification 1A petechial hemorrhage at the caudate. Difficult to exclude trace left hemisphere SAH and IVH, repeat plain Head CT could evaluate further. No significant intracranial mass effect. 4.  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 Electronically Signed   By: Genevie Ann M.D.   On: 01/28/2020 09:45   MR BRAIN WO CONTRAST  Result Date: 01/28/2020 CLINICAL DATA:  64 year old male code stroke presentation with left MCA occlusion, left ACA A3 occlusion. Endovascular reperfusion yesterday. EXAM: MRI HEAD WITHOUT CONTRAST MRA HEAD WITHOUT CONTRAST TECHNIQUE: Multiplanar, multiecho pulse sequences of the brain and surrounding structures were obtained without intravenous contrast. Angiographic images of the head were obtained using MRA technique without contrast. COMPARISON:  CTA head and neck yesterday. FINDINGS: MRI HEAD FINDINGS Brain: Dense restricted diffusion in the left caudate and ventral putamen, with associated cytotoxic edema and caudate swelling. Petechial hemorrhage (Heidelberg Classification 1A, series 8, image 48). There are also a few punctate foci of restricted diffusion in the nearby left inferior frontal gyrus and left insula, such as on series 2, image 28. Superimposed gyriform restricted diffusion along the left cingulate, and also involving posterior left superior frontal gyrus and parietal lobe (series 2, image 44 and series 4, image 17. T2 and FLAIR hyperintensity with gyral edema. No hemorrhage. One or 2 punctate areas of contralateral right caudate restricted diffusion, most notably series 2, image 38. T2 and FLAIR hyperintensity with no hemorrhage or mass effect. No other restricted diffusion. Chronic appearing hemosiderin in right inferior frontal gyrus has associated dark T2 rim and punctate internal T2 hyperintensity compatible with a small cavernous venous vascular malformation. No developmental venous anomaly associated on CTA. No regional edema or mass effect. Trace subarachnoid hemorrhage would be difficult to exclude such  as in the left occipital lobe sulci on series 7, image 14. Also questionable trace blood in the left temporal and occipital  horns, versus subependymal chronic microhemorrhages there. No ventriculomegaly. No midline shift. Basilar cisterns remain normal. Cervicomedullary junction and pituitary are within normal limits. Outside of the acute findings there are a few scattered areas of subcortical white matter T2 and FLAIR hyperintensity. The right basal ganglia, thalami, brainstem and cerebellum appear negative. Vascular: Major intracranial vascular flow voids are preserved. Skull and upper cervical spine: Negative visible cervical spine, bone marrow signal. Sinuses/Orbits: Negative orbits. Mild to moderate paranasal sinus mucosal thickening. Other: Intubated.  Fluid in the pharynx.  Mastoids remain clear. MRA HEAD FINDINGS Antegrade flow in the posterior circulation with mildly dominant left V4 segment and no distal vertebral stenosis. Patent PICA origins, vertebrobasilar junction, basilar artery, AICA origins, SCA and PCA origins. Tortuous left P1 and fetal type left PCA on that side. Small right posterior communicating artery. Bilateral PCA branches are within normal limits. Antegrade flow in both ICA siphons, the left appears dominant with dominant left ACA. No siphon stenosis. Patent carotid termini. Normal ophthalmic and posterior communicating artery origins. Patent MCA and ACA origins. Anterior communicating artery and visible ACA branches are within normal limits. Restored patency on the left. Left MCA M1 segment and left MCA trifurcation are patent without stenosis. Left MCA branches appear within normal limits. Right MCA bifurcates early without stenosis. Visible right MCA branches are within normal limits. IMPRESSION: 1. Negative intracranial MRA, patent left MCA and ACA. 2. Confluent left basal ganglia infarct with edema and mild swelling. Left cingulate and additional left superior frontal / superior perirolandic gyral restricted diffusion with mild cortical edema. A few punctate foci of restricted diffusion elsewhere  including at the right cingulate. 3. Heidelberg Classification 1A petechial hemorrhage at the caudate. Difficult to exclude trace left hemisphere SAH and IVH, repeat plain Head CT could evaluate further. No significant intracranial mass effect. 4. 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 Electronically Signed   By: Genevie Ann M.D.   On: 01/28/2020 09:45   DG CHEST PORT 1 VIEW  Result Date: 01/27/2020 CLINICAL DATA:  Stroke, intubated EXAM: PORTABLE CHEST 1 VIEW COMPARISON:  None. FINDINGS: Single frontal view of the chest demonstrates unremarkable cardiac silhouette. Ectasia of the thoracic aorta. Endotracheal tube overlies tracheal air column tip well above carina. Enteric catheter passes below diaphragm tip and side port project over gastric fundus. No airspace disease, effusion, or pneumothorax. No acute bony abnormalities. IMPRESSION: 1. Support devices as above. 2. No acute intrathoracic process. Electronically Signed   By: Randa Ngo M.D.   On: 01/27/2020 19:08   CT HEAD CODE STROKE WO CONTRAST  Result Date: 01/27/2020 CLINICAL DATA:  Code stroke.  Stroke.  Last seen normal 1 hour ago. EXAM: CT HEAD WITHOUT CONTRAST TECHNIQUE: Contiguous axial images were obtained from the base of the skull through the vertex without intravenous contrast. COMPARISON:  None. FINDINGS: Brain: No evidence of acute infarction, hemorrhage, hydrocephalus, extra-axial collection or mass lesion/mass effect. Vascular: Hyperdense left M2 segment in the sylvian fissure compatible with acute thrombus. No other hyperdense vessel. Skull: Negative Sinuses/Orbits: Retention cyst left maxillary sinus. Negative orbit. Other: None ASPECTS ([place] Stroke Program Early CT Score) - Ganglionic level infarction (caudate, lentiform nuclei, internal capsule, insula, M1-M3 cortex): 7 - Supraganglionic infarction (M4-M6 cortex): 3 Total score (0-10 with 10 being normal): 10 IMPRESSION: 1. Hyperdense left M2  branch compatible with acute thrombus. No acute infarct  or hemorrhage 2. ASPECTS is 10 3. These results were called by telephone at the time of interpretation on 01/27/2020 at 1:56 pm to provider Rory Percy , who verbally acknowledged these results. Electronically Signed   By: Franchot Gallo M.D.   On: 01/27/2020 13:57   CT ANGIO HEAD CODE STROKE  Result Date: 01/27/2020 CLINICAL DATA:  Stroke.  Aphasia. EXAM: CT ANGIOGRAPHY HEAD AND NECK TECHNIQUE: Multidetector CT imaging of the head and neck was performed using the standard protocol during bolus administration of intravenous contrast. Multiplanar CT image reconstructions and MIPs were obtained to evaluate the vascular anatomy. Carotid stenosis measurements (when applicable) are obtained utilizing NASCET criteria, using the distal internal carotid diameter as the denominator. CONTRAST:  61mL OMNIPAQUE IOHEXOL 350 MG/ML SOLN COMPARISON:  CT head 01/27/2020 FINDINGS: CTA NECK FINDINGS Aortic arch: 4 vessel arch. Left vertebral artery origin from the arch. Proximal great vessels widely patent. Minimal atherosclerotic disease in the aortic arch. Right carotid system: Right carotid widely patent without significant stenosis. Left carotid system: Mild atherosclerotic disease left carotid bifurcation without significant stenosis. Vertebral arteries: Both vertebral arteries patent to the basilar without significant stenosis. Skeleton: Cervical spondylosis.  No acute skeletal abnormality. Other neck: Negative for soft tissue mass or edema in the neck. Upper chest: Lung apices clear bilaterally. Review of the MIP images confirms the above findings CTA HEAD FINDINGS Anterior circulation: Mild atherosclerotic disease in the cavernous carotid bilaterally without significant stenosis. Occlusion of the left middle cerebral artery at the bifurcation. There is a small early branch on the left MCA which extends to the parietal lobe. There is clot within this vessel. However there is  occlusion of the majority of the superior and inferior divisions of the left MCA . Minimal collateral flow on delayed imaging. Both anterior cerebral arteries widely patent without stenosis. Right A1 segment is hypoplastic. Right middle cerebral artery widely patent without stenosis. Posterior circulation: Both vertebral arteries patent to the basilar. PICA patent bilaterally. Basilar widely patent. AICA, superior cerebellar, and posterior cerebral arteries patent bilaterally without stenosis. Fetal origin left posterior cerebral artery. Venous sinuses: Normal venous enhancement. Anatomic variants: None Review of the MIP images confirms the above findings IMPRESSION: 1. Acute occlusion left MCA bifurcation with little flow in the superior and inferior divisions left MCA. There is an early branch supplying a part of the left parietal lobe. Minimal collateral flow. 2. No other intracranial stenosis or occlusion. 3. No significant carotid or vertebral artery stenosis in the neck. 4. These results were called by telephone at the time of interpretation on 01/27/2020 at 2:41 pm to provider Rory Percy , who verbally acknowledged these results. Electronically Signed   By: Franchot Gallo M.D.   On: 01/27/2020 14:41   CT ANGIO NECK CODE STROKE  Result Date: 01/27/2020 CLINICAL DATA:  Stroke.  Aphasia. EXAM: CT ANGIOGRAPHY HEAD AND NECK TECHNIQUE: Multidetector CT imaging of the head and neck was performed using the standard protocol during bolus administration of intravenous contrast. Multiplanar CT image reconstructions and MIPs were obtained to evaluate the vascular anatomy. Carotid stenosis measurements (when applicable) are obtained utilizing NASCET criteria, using the distal internal carotid diameter as the denominator. CONTRAST:  86mL OMNIPAQUE IOHEXOL 350 MG/ML SOLN COMPARISON:  CT head 01/27/2020 FINDINGS: CTA NECK FINDINGS Aortic arch: 4 vessel arch. Left vertebral artery origin from the arch. Proximal great vessels  widely patent. Minimal atherosclerotic disease in the aortic arch. Right carotid system: Right carotid widely patent without significant stenosis. Left carotid system: Mild atherosclerotic  disease left carotid bifurcation without significant stenosis. Vertebral arteries: Both vertebral arteries patent to the basilar without significant stenosis. Skeleton: Cervical spondylosis.  No acute skeletal abnormality. Other neck: Negative for soft tissue mass or edema in the neck. Upper chest: Lung apices clear bilaterally. Review of the MIP images confirms the above findings CTA HEAD FINDINGS Anterior circulation: Mild atherosclerotic disease in the cavernous carotid bilaterally without significant stenosis. Occlusion of the left middle cerebral artery at the bifurcation. There is a small early branch on the left MCA which extends to the parietal lobe. There is clot within this vessel. However there is occlusion of the majority of the superior and inferior divisions of the left MCA . Minimal collateral flow on delayed imaging. Both anterior cerebral arteries widely patent without stenosis. Right A1 segment is hypoplastic. Right middle cerebral artery widely patent without stenosis. Posterior circulation: Both vertebral arteries patent to the basilar. PICA patent bilaterally. Basilar widely patent. AICA, superior cerebellar, and posterior cerebral arteries patent bilaterally without stenosis. Fetal origin left posterior cerebral artery. Venous sinuses: Normal venous enhancement. Anatomic variants: None Review of the MIP images confirms the above findings IMPRESSION: 1. Acute occlusion left MCA bifurcation with little flow in the superior and inferior divisions left MCA. There is an early branch supplying a part of the left parietal lobe. Minimal collateral flow. 2. No other intracranial stenosis or occlusion. 3. No significant carotid or vertebral artery stenosis in the neck. 4. These results were called by telephone at the  time of interpretation on 01/27/2020 at 2:41 pm to provider Rory Percy , who verbally acknowledged these results. Electronically Signed   By: Franchot Gallo M.D.   On: 01/27/2020 14:41    Labs:  CBC: Recent Labs    03/24/19 0938 03/24/19 0938 12/17/19 0831 01/27/20 1341 01/27/20 1341 01/27/20 1346 01/27/20 1729 01/27/20 2123 01/28/20 0043  WBC 6.1  --  6.0 9.9  --   --   --   --   --   HGB 14.4   < > 15.3 14.3   < > 14.6 14.6 12.9* 12.6*  HCT 43.6   < > 45.5 43.4   < > 43.0 43.0 38.0* 37.0*  PLT 211  --  220 242  --   --   --   --   --    < > = values in this interval not displayed.    COAGS: Recent Labs    01/27/20 1341  INR 1.0  APTT 27    BMP: Recent Labs    03/24/19 0938 03/24/19 0938 12/17/19 0836 12/17/19 0836 01/27/20 1341 01/27/20 1341 01/27/20 1346 01/27/20 1729 01/27/20 2123 01/28/20 0043  NA 139   < > 139  --  140   < > 141 139 139 141  K 5.2   < > 5.3*   < > 3.6   < > 3.5 4.1 4.1 3.9  CL 101  --  101  --  107  --  103  --   --   --   CO2 25  --  21  --  23  --   --   --   --   --   GLUCOSE 105*  --  109*  --  124*  --  121*  --   --   --   BUN 20  --  21  --  18  --  22  --   --   --   CALCIUM 9.6  --  9.3  --  9.2  --   --   --   --   --   CREATININE 1.12  --  1.01  --  1.09  --  0.90  --   --   --   GFRNONAA 69  --  78  --  >60  --   --   --   --   --   GFRAA 80  --  90  --  >60  --   --   --   --   --    < > = values in this interval not displayed.    LIVER FUNCTION TESTS: Recent Labs    03/24/19 0938 12/17/19 0836 01/27/20 1341  BILITOT 0.7 0.7 1.0  AST 14 24 23   ALT 25 51* 37  ALKPHOS 43 57 45  PROT 6.3 6.4 6.2*  ALBUMIN 4.2 4.4 4.1    Assessment and Plan: Distal left M1/MCA occlusion, s/p one pass direct contact aspiration with complete recanalization of the MCA Distal left A3/ACA occlusion s/p two pass direct contact aspiration in the ACA with no recanalization; one pass stent retriever with complete recanalization. Patient  remains intubated this AM.  Requiring increased sedation due to level of agitation.  Neuro exam deferred.  Procedure site intact.  Groin soft, no evidence of hematoma.  Distal pulses intact.  NIR to follow.   Electronically Signed: Docia Barrier, PA 01/28/2020, 12:43 PM   I spent a total of 15 Minutes at the the patient's bedside AND on the patient's hospital floor or unit, greater than 50% of which was counseling/coordinating care for L MCA occlusion, L ACA occlusion.

## 2020-01-28 NOTE — Progress Notes (Signed)
SLP Cancellation Note  Patient Details Name: Dustin Schneider MRN: [id number] DOB: 1956-03-20   Cancelled treatment:       Reason Eval/Treat Not Completed: Patient not medically ready.    [name], Katherene Ponto 01/28/2020, 7:23 AM

## 2020-01-29 DIAGNOSIS — R1312 Dysphagia, oropharyngeal phase: Secondary | ICD-10-CM

## 2020-01-29 DIAGNOSIS — I639 Cerebral infarction, unspecified: Secondary | ICD-10-CM

## 2020-01-29 LAB — BASIC METABOLIC PANEL
Anion gap: 7 (ref 5–15)
BUN: 16 mg/dL (ref 8–23)
CO2: 23 mmol/L (ref 22–32)
Calcium: 8 mg/dL — ABNORMAL LOW (ref 8.9–10.3)
Chloride: 109 mmol/L (ref 98–111)
Creatinine, Ser: 0.97 mg/dL (ref 0.61–1.24)
GFR calc Af Amer: >60 mL/min (ref >60)
GFR calc non Af Amer: >60 mL/min (ref >60)
Glucose, Bld: 125 mg/dL — ABNORMAL HIGH (ref 70–99)
Potassium: 3.5 mmol/L (ref 3.5–5.1)
Sodium: 139 mmol/L (ref 135–145)

## 2020-01-29 LAB — CBC
HCT: 35.9 % — ABNORMAL LOW (ref 39.0–52.0)
Hemoglobin: 11.7 g/dL — ABNORMAL LOW (ref 13.0–17.0)
MCH: 31.9 pg (ref 26.0–34.0)
MCHC: 32.6 g/dL (ref 30.0–36.0)
MCV: 97.8 fL (ref 80.0–100.0)
Platelets: 144 10*3/uL — ABNORMAL LOW (ref 150–400)
RBC: 3.67 MIL/uL — ABNORMAL LOW (ref 4.22–5.81)
RDW: 14.2 % (ref 11.5–15.5)
WBC: 8.4 10*3/uL (ref 4.0–10.5)
nRBC: 0 % (ref 0.0–0.2)

## 2020-01-29 LAB — HEMOGLOBIN A1C
Hgb A1c MFr Bld: 5.6 % (ref 4.8–5.6)
Mean Plasma Glucose: 114 mg/dL

## 2020-01-29 LAB — MAGNESIUM: Magnesium: 1.9 mg/dL (ref 1.7–2.4)

## 2020-01-29 LAB — PHOSPHORUS: Phosphorus: 2.8 mg/dL (ref 2.5–4.6)

## 2020-01-29 LAB — TROPONIN I (HIGH SENSITIVITY): Troponin I (High Sensitivity): 5 ng/L (ref <18)

## 2020-01-29 MED ORDER — POTASSIUM CHLORIDE 10 MEQ/50ML IV SOLN: 10.0000 meq | INTRAVENOUS | Status: DC

## 2020-01-29 MED ORDER — POTASSIUM CHLORIDE 10 MEQ/100ML IV SOLN
10.0000 meq | INTRAVENOUS | Status: AC
  Administered 2020-01-29 (×2): 10 meq via INTRAVENOUS

## 2020-01-29 MED ORDER — MAGNESIUM SULFATE 2 GM/50ML IV SOLN
2.0000 g | Freq: Once | INTRAVENOUS | Status: AC
  Administered 2020-01-29: 2 g via INTRAVENOUS
  Filled 2020-01-29: qty 50

## 2020-01-29 MED ORDER — POTASSIUM CHLORIDE 10 MEQ/100ML IV SOLN
INTRAVENOUS | Status: AC
  Filled 2020-01-29: qty 200

## 2020-01-29 MED ORDER — POTASSIUM CHLORIDE 20 MEQ/15ML (10%) PO SOLN: 40.0000 meq | Freq: Once | ORAL | Status: DC

## 2020-01-29 MED ORDER — PRO-STAT SUGAR FREE PO LIQD
30.0000 mL | Freq: Two times a day (BID) | ORAL | Status: DC
  Administered 2020-01-29 – 2020-02-02 (×9): 30 mL
  Filled 2020-01-29 (×9): qty 30

## 2020-01-29 MED ORDER — ORAL CARE MOUTH RINSE
15.0000 mL | Freq: Two times a day (BID) | OROMUCOSAL | Status: DC
  Administered 2020-01-29 – 2020-02-02 (×7): 15 mL via OROMUCOSAL

## 2020-01-29 MED ORDER — OSMOLITE 1.5 CAL PO LIQD
1000.0000 mL | ORAL | Status: DC
  Administered 2020-01-29 – 2020-02-02 (×4): 1000 mL
  Filled 2020-01-29 (×7): qty 1000

## 2020-01-29 MED ORDER — POTASSIUM CHLORIDE 20 MEQ PO PACK: 40.0000 meq | PACK | Freq: Once | ORAL | Status: DC

## 2020-01-29 NOTE — Progress Notes (Signed)
SLP Cancellation Note  Patient Details Name: Dustin Schneider MRN: [id number] DOB: 08-25-1955   Cancelled treatment:       Reason Eval/Treat Not Completed: Patient not medically ready. Order for swallow evaluation received in anticipation of extubation on previous date, but pt remains on the vent. Will f/u as medically ready.   Osie Bond., M.A. [facility] Pager [phone] Office [phone]  01/29/2020, 8:17 AM

## 2020-01-29 NOTE — Evaluation (Addendum)
Clinical/Bedside Swallow Evaluation Patient Details  Name: Dustin Schneider MRN: [id number] Date of Birth: [birth year]  Today's Date: 01/29/2020 Time: SLP Start Time (ACUTE ONLY): 2683 SLP Stop Time (ACUTE ONLY): 4196 SLP Time Calculation (min) (ACUTE ONLY): 14 min  Past Medical History:  Past Medical History:  Diagnosis Date   Hypertension    NICM (nonischemic cardiomyopathy) ([place]) 06/15/2018   04/23/18: EF 45%, 09/15/18: NOrmal LVEF   Paroxysmal atrial fibrillation ([place]) 06/15/2018   Visit for monitoring Tikosyn therapy 06/12/2018   Past Surgical History:  Past Surgical History:  Procedure Laterality Date   CARDIOVERSION N/A 05/13/2018   Procedure: CARDIOVERSION;  Surgeon: [name], MD;  Location: [place];  Service: Cardiovascular;  Laterality: N/A;   CARDIOVERSION N/A 06/13/2018   Procedure: CARDIOVERSION;  Surgeon: [name], MD;  Location[facility] END[facility]COPY;  Service: Cardiovascular;  Laterality: N/A;   CARDIOVERSION N/A 06/23/2018   Procedure: CARDIOVERSION;  Surgeon: [name], MD;  Location: [place];  Service: Cardiovascular;  Laterality: N/A;   excision of actinic keratosis     EYE SURGERY     eye lift   IR PERCUTANEOUS ART THROMBECTOMY/INFUSION INTRACRANIAL INC DIAG ANGIO  01/27/2020       RADIOLOGY WITH ANESTHESIA N/A 01/27/2020   Procedure: IR WITH ANESTHESIA;  Surgeon: Radiologist, Medication, MD;  Location: [place];  Service: Radiology;  Laterality: N/A;   TONSILLECTOMY     WISDOM TOOTH EXTRACTION     HPI:  Pt is a 64 yo male presenting iwth acute onset R sided weakness and aphasia. CT showed hyerdensity in the L M2 branch. Pt underwent emergent L MCA and ACA revascularization on 6/16. He was intubated 6/16-6/18. PMH includes: afib, cardiomyopathy, HTN   Assessment / Plan / Recommendation Clinical Impression  Pt has R sided facial weakness (CN VII) with reduced labial seal and bolus loss. He clears his oral cavity well though and at times seems to  recognize the anterior loss, suggestive of some sensation. He has multiple subswallows per bolus and mild audible wetness although vocal quality cannot be adequately assessed. Given signs of dysphagia, weak and inconsistent cough to command, and risk factors for silent aspiration, recommend starting with ice chips today. I think he will be ready for MBS to better evaluate oropharyngeal swallow as this can be scheduled.  SLP Visit Diagnosis: Dysphagia, unspecified (R13.10)    Aspiration Risk  Moderate aspiration risk    Diet Recommendation Ice chips PRN after oral care   Medication Administration: Via alternative means    Other  Recommendations Oral Care Recommendations: Oral care QID Other Recommendations: Have oral suction available   Follow up Recommendations Inpatient Rehab      Frequency and Duration min 2x/week  2 weeks       Prognosis Prognosis for Safe Diet Advancement: Good Barriers to Reach Goals: Language deficits      Swallow Study   General HPI: Pt is a 64 yo male presenting iwth acute onset R sided weakness and aphasia. CT showed hyerdensity in the L M2 branch. Pt underwent emergent L MCA and ACA revascularization on 6/16. He was intubated 6/16-6/18. PMH includes: afib, cardiomyopathy, HTN Type of Study: Bedside Swallow Evaluation Previous Swallow Assessment: none in chart Diet Prior to this Study: NPO;NG Tube Temperature Spikes Noted: No Respiratory Status: Nasal cannula History of Recent Intubation: Yes Length of Intubations (days): 2 days Date extubated: 01/29/20 Behavior/Cognition: Alert;Cooperative;Requires cueing Oral Cavity Assessment: Within Functional Limits Oral Care Completed by SLP: No Oral Cavity - Dentition: Adequate  natural dentition Vision: Functional for self-feeding Self-Feeding Abilities: Able to feed self Patient Positioning: Upright in bed Baseline Vocal Quality: Normal Volitional Cough: Weak    Oral/Motor/Sensory Function Overall Oral  Motor/Sensory Function: Moderate impairment Facial ROM: Reduced right;Suspected CN VII (facial) dysfunction Facial Symmetry: Abnormal symmetry right;Suspected CN VII (facial) dysfunction Facial Strength: Reduced right;Suspected CN VII (facial) dysfunction Lingual ROM: Within Functional Limits Lingual Symmetry: Within Functional Limits   Ice Chips Ice chips: Impaired Presentation: Spoon Oral Phase Impairments: Reduced labial seal Oral Phase Functional Implications: Right anterior spillage   Thin Liquid Thin Liquid: Impaired Presentation: Cup;Self Fed;Straw;Spoon Oral Phase Impairments: Reduced labial seal Pharyngeal  Phase Impairments: Multiple swallows    Nectar Thick Nectar Thick Liquid: Not tested   Honey Thick Honey Thick Liquid: Not tested   Puree Puree: Impaired Presentation: Spoon;Self Fed Pharyngeal Phase Impairments: Multiple swallows   Solid     Solid: Not tested      Osie Bond., M.A. [facility] Pager [phone] Office [phone]  01/29/2020,4:53 PM

## 2020-01-29 NOTE — Evaluation (Signed)
Occupational Therapy Evaluation Patient Details Name: Dustin Schneider MRN: [id number] DOB: 1956/07/09 Today's Date: 01/29/2020    History of Present Illness 64 y.o. male with history of paroxysmal A. fib on chronic Xarelto, nonischemic cardiomyopathy and hypertension.  Patient was at [facility] hardware today.  Patient was noted to come out of the bathroom and suddenly collapsed by bystanders. On arrival they noted that he had a left forced gaze and right hemiplegia.  Patient was also nauseous and vomiting. of head and neck was immediately obtained and showed a dense left MCA. Pt underwent emergent L MCA and ACA revascularization on 6/16.   Clinical Impression   Pt admitted with above. He demonstrates the below listed deficits and will benefit from continued OT to maximize safety and independence with BADLs.  Pt seen in conjunction with PT.  He demonstrates Rt hemiplegia, as well as significant communication deficits.  He will follow simple commands inconsistently. He currently requires min - total A for ADLs and mod A +2- max A +2 for functional mobility.  He lives alone, but significant other lives very close.  He was working as an Training and development officer.  Recommend CIR level rehab at discharge. Will follow acutely.       Follow Up Recommendations  CIR    Equipment Recommendations  None recommended by OT    Recommendations for Other Services Rehab consult     Precautions / Restrictions Precautions Precautions: Fall      Mobility Bed Mobility Overal bed mobility: Needs Assistance Bed Mobility: Supine to Sit     Supine to sit: Max assist;+2 for physical assistance        Transfers Overall transfer level: Needs assistance Equipment used: 2 person hand held assist Transfers: Sit to/from [employer] Transfers Sit to Stand: Max assist;+2 physical assistance Stand pivot transfers: Max assist;+2 physical assistance       General transfer comment: OT shifting pt toward L  side to avoid pushing and R lateral lean, pt with some noted R knee flexion during standing, no buckling however WB on RLE may have not been enough to cause buckling at this time.    Balance Overall balance assessment: Needs assistance Sitting-balance support: Single extremity supported;Feet supported Sitting balance-Leahy Scale: Poor Sitting balance - Comments: modA progressing to minA, pt needs cues to limit pushing toward R side and to correct R lateral lean Postural control: Right lateral lean Standing balance support: Bilateral upper extremity supported Standing balance-Leahy Scale: Zero Standing balance comment: maxA to maintain static standing                           ADL either performed or assessed with clinical judgement   ADL Overall ADL's : Needs assistance/impaired Eating/Feeding: NPO   Grooming: Wash/dry hands;Wash/dry face;Oral care;Brushing hair;Moderate assistance;Sitting Grooming Details (indicate cue type and reason): Pt using yaunker mod I while in bed  Upper Body Bathing: Maximal assistance;Sitting   Lower Body Bathing: Maximal assistance;Sit to/from stand   Upper Body Dressing : Total assistance;Sitting   Lower Body Dressing: Total assistance;Sit to/from stand   Toilet Transfer: Maximal assistance;+2 for safety/equipment;+2 for physical assistance;Stand-pivot;BSC   Toileting- Clothing Manipulation and Hygiene: Total assistance;Sit to/from stand       Functional mobility during ADLs: Maximal assistance;+2 for physical assistance;+2 for safety/equipment       Vision Baseline Vision/History: No visual deficits Additional Comments: to be assessed further      Perception     Praxis Praxis  Praxis-Other Comments: to be further assessed     Pertinent Vitals/Pain Pain Assessment: Faces Faces Pain Scale: No hurt     Hand Dominance Right   Extremity/Trunk Assessment Upper Extremity Assessment Upper Extremity Assessment: RUE  deficits/detail RUE Deficits / Details: Pt with no AROM Rt UE, however, there was an increase in tone/tension when he was asked to move Rt UE  RUE Coordination: decreased fine motor;decreased gross motor   Lower Extremity Assessment Lower Extremity Assessment: Defer to PT evaluation   Cervical / Trunk Assessment Cervical / Trunk Assessment: Normal   Communication Communication Communication: Expressive difficulties;Receptive difficulties (expressive>receptive difficulties at this time)   Cognition Arousal/Alertness: Awake/alert Behavior During Therapy: Flat affect (does appear to smile twice during session) Overall Cognitive Status: Difficult to assess                                 General Comments: pt with impaired ability to follow commands but this does improve significantly during session, pt's awareness of deficits also improves during session. Slowed processing initially and requiring verbal cues   General Comments  VSS on RA, pt's significant other is present and inquisitive about prognosis, post-acute rehab, and home renovations. Pt on 2L [place] initially, removed for transfer and left off at end of session as pt had stable sats. RN aware    Exercises     Shoulder Instructions      Home Living Family/patient expects to be discharged to:: Private residence Living Arrangements: Alone Available Help at Discharge: Family;Friend(s);Available PRN/intermittently Type of Home: House Home Access: Stairs to enter Entrance Stairs-Number of Steps: 1 Entrance Stairs-Rails: None Home Layout: One level (2 steps down into bedroom)     Bathroom Shower/Tub: Corporate investment banker: Standard     Home Equipment: None          Prior Functioning/Environment Level of Independence: Independent        Comments: pt is a famous Development worker, community, 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 [place]        OT Problem List: Decreased strength;Decreased  range of motion;Decreased activity tolerance;Impaired balance (sitting and/or standing);Impaired vision/perception;Decreased coordination;Decreased cognition;Decreased safety awareness;Decreased knowledge of use of DME or AE;Impaired sensation;Impaired tone;Impaired UE functional use      OT Treatment/Interventions: Self-care/ADL training;Neuromuscular education;DME and/or AE instruction;Therapeutic activities;Cognitive remediation/compensation;Visual/perceptual remediation/compensation;Patient/family education;Balance training;Manual therapy;Splinting    OT Goals(Current goals can be found in the care plan section) Acute Rehab OT Goals Patient Stated Goal: to be able to sculpt again  OT Goal Formulation: With patient/family Time For Goal Achievement: 02/12/20 Potential to Achieve Goals: Good ADL Goals Pt Will Perform Grooming: with set-up;with supervision;sitting Pt Will Perform Upper Body Bathing: with min assist;sitting Pt Will Transfer to Toilet: with min assist;stand pivot transfer;bedside commode Pt Will Perform Toileting - Clothing Manipulation and hygiene: with mod assist;sit to/from stand Additional ADL Goal #1: Pt will use Rt UE as a stabilizer with min A during ADLs  OT Frequency: Min 2X/week   Barriers to D/C:            Co-evaluation   Reason for Co-Treatment: Complexity of the patient's impairments (multi-system involvement);Necessary to address cognition/behavior during functional activity;For patient/therapist safety;To address functional/ADL transfers          AM-PAC OT "6 Clicks" Daily Activity     Outcome Measure Help from another person eating meals?: Total Help from another person taking care of personal grooming?: A Little  Help from another person toileting, which includes using toliet, bedpan, or urinal?: A Lot Help from another person bathing (including washing, rinsing, drying)?: A Lot Help from another person to put on and taking off regular upper body  clothing?: A Lot Help from another person to put on and taking off regular lower body clothing?: Total 6 Click Score: 11   End of Session Nurse Communication: Mobility status;Need for lift equipment  Activity Tolerance: Patient tolerated treatment well Patient left: in chair;with call bell/phone within reach;with chair alarm set;with family/visitor present;with nursing/sitter in room  OT Visit Diagnosis: Hemiplegia and hemiparesis;Cognitive communication deficit (R41.841) Symptoms and signs involving cognitive functions: Cerebral infarction Hemiplegia - Right/Left: Right Hemiplegia - dominant/non-dominant: Dominant Hemiplegia - caused by: Cerebral infarction                Time: 0539-7673 OT Time Calculation (min): 37 min Charges:  OT General Charges $OT Visit: 1 Visit OT Evaluation $OT Eval Moderate Complexity: 1 Mod  Nilsa Nutting., OTR/L Acute Rehabilitation Services Pager [phone] Office [phone]   Lucille Passy M 01/29/2020, 5:32 PM

## 2020-01-29 NOTE — Evaluation (Signed)
Speech Language Pathology Evaluation Patient Details Name: Dustin Schneider MRN: [id number] DOB: 1955-08-25 Today's Date: 01/29/2020 Time: 4268-3419 SLP Time Calculation (min) (ACUTE ONLY): 13 min  Problem List:  Patient Active Problem List   Diagnosis Date Noted  . Acute respiratory failure ([place])   . Acute ischemic stroke ([place]) 01/27/2020  . Aspiration into airway 01/27/2020  . Chronic anticoagulation 01/27/2020  . Paroxysmal atrial fibrillation ([place]) 06/15/2018  . Essential hypertension 06/15/2018  . Visit for monitoring Tikosyn therapy 06/12/2018   Past Medical History:  Past Medical History:  Diagnosis Date  . Hypertension   . NICM (nonischemic cardiomyopathy) ([place]) 06/15/2018   04/23/18: EF 45%, 09/15/18: NOrmal LVEF  . Paroxysmal atrial fibrillation ([place]) 06/15/2018  . Visit for monitoring Tikosyn therapy 06/12/2018   Past Surgical History:  Past Surgical History:  Procedure Laterality Date  . CARDIOVERSION N/A 05/13/2018   Procedure: CARDIOVERSION;  Surgeon: [name], MD;  Location: [facility] ENDOSCOPY;  Service: Cardiovascular;  Laterality: N/A;  . CARDIOVERSION N/A 06/13/2018   Procedure: CARDIOVERSION;  Surgeon: [name], MD;  Location: [facility] ENDOSCOPY;  Service: Cardiovascular;  Laterality: N/A;  . CARDIOVERSION N/A 06/23/2018   Procedure: CARDIOVERSION;  Surgeon: [name], MD;  Location: [place];  Service: Cardiovascular;  Laterality: N/A;  . excision of actinic keratosis    . EYE SURGERY     eye lift  . IR PERCUTANEOUS ART THROMBECTOMY/INFUSION INTRACRANIAL INC DIAG ANGIO  01/27/2020      . RADIOLOGY WITH ANESTHESIA N/A 01/27/2020   Procedure: IR WITH ANESTHESIA;  Surgeon: Radiologist, Medication, MD;  Location: [place];  Service: Radiology;  Laterality: N/A;  . TONSILLECTOMY    . WISDOM TOOTH EXTRACTION     HPI:  Pt is a 64 yo male presenting iwth acute onset R sided weakness and aphasia. CT showed hyerdensity in the L M2 branch. Pt underwent emergent L MCA and  ACA revascularization on 6/16. He was intubated 6/16-6/18. PMH includes: afib, cardiomyopathy, HTN   Assessment / Plan / Recommendation Clinical Impression  Pt has an expressive > receptive aphasia with significant difficulties with initiation of verbal expression. This includes difficulty repeating even at the word to sound level, vocalizing x1 across evaluation. Pt nodded his head "yes" x1 in response to questioning, but responded better to use of a hand squeeze to indicate "yes." Using this nonverbal method, he answered basic yes/no questions and a few orientation questions via auditory scanning with 100% accuracy. When challenged with mildly complex yes/no questions, he was no longer able to reliably use this strategy. 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 He will benefit from SLP f/u to maximize functional communication.    SLP Assessment  SLP Recommendation/Assessment: Patient needs continued Speech Lanaguage Pathology Services SLP Visit Diagnosis: Aphasia (R47.01)    Follow Up Recommendations  Inpatient Rehab    Frequency and Duration min 2x/week  2 weeks      SLP Evaluation Cognition  Overall Cognitive Status: Difficult to assess (aphasia) Arousal/Alertness: Awake/alert Orientation Level: Oriented to person;Oriented to place;Oriented to time Attention: Sustained Sustained Attention: Appears intact (during simple, functional tasks)       Comprehension  Auditory Comprehension Overall Auditory Comprehension: Impaired Yes/No Questions: Impaired Basic Biographical Questions: 76-100% accurate Basic Immediate Environment Questions: 75-100% accurate Complex Questions: 0-24% accurate Commands: Impaired One Step Basic Commands: 75-100% accurate    Expression Expression Primary Mode of Expression: Verbal Verbal Expression Overall Verbal Expression: Impaired Initiation: Impaired Automatic Speech:  (none) Repetition: Impaired Level of Impairment:  Word  level Non-Verbal Means of Communication: Other (comment) (see clinical impressions)   Oral / Motor  Oral Motor/Sensory Function Overall Oral Motor/Sensory Function: Moderate impairment Facial ROM: Reduced right;Suspected CN VII (facial) dysfunction Facial Symmetry: Abnormal symmetry right;Suspected CN VII (facial) dysfunction Facial Strength: Reduced right;Suspected CN VII (facial) dysfunction Lingual ROM: Within Functional Limits Lingual Symmetry: Within Functional Limits Motor Speech Overall Motor Speech: Other (comment) (UTA)   GO                    Osie Bond., M.A. [place] Acute Rehabilitation Services Pager [phone] Office [phone]  01/29/2020, 5:00 PM

## 2020-01-29 NOTE — Progress Notes (Signed)
STROKE TEAM PROGRESS NOTE   INTERVAL HISTORY Son at bedside. Pt extubated this am and now sitting in chair. Awake alert, following commands on the left, however, expressive aphasia, not able to name or repeat. Right hemiplegia.   Vitals:   01/29/20 0700 01/29/20 0800 01/29/20 0819 01/29/20 0822  BP: 125/81 (!) 145/86 (!) 145/86   Pulse: 62 72 76   Resp: 13 13 13    Temp:  99 F (37.2 C)    TempSrc:  Axillary    SpO2: 100% 99% 99% 99%  Weight:      Height:       CBC:  Recent Labs  Lab 01/27/20 1341 01/27/20 1346 01/28/20 0043 01/29/20 0545  WBC 9.9  --   --  8.4  NEUTROABS 4.9  --   --   --   HGB 14.3   < > 12.6* 11.7*  HCT 43.4   < > 37.0* 35.9*  MCV 96.7  --   --  97.8  PLT 242  --   --  144*   < > = values in this interval not displayed.   Basic Metabolic Panel:  Recent Labs  Lab 01/27/20 1341 01/27/20 1341 01/27/20 1346 01/27/20 1729 01/28/20 0043 01/29/20 0545  NA 140   < > 141   < > 141 139  K 3.6   < > 3.5   < > 3.9 3.5  CL 107   < > 103  --   --  109  CO2 23  --   --   --   --  23  GLUCOSE 124*   < > 121*  --   --  125*  BUN 18   < > 22  --   --  16  CREATININE 1.09   < > 0.90  --   --  0.97  CALCIUM 9.2  --   --   --   --  8.0*  MG  --   --   --   --   --  1.9  PHOS  --   --   --   --   --  2.8   < > = values in this interval not displayed.   Lipid Panel:     Component Value Date/Time   CHOL 125 01/28/2020 0716   CHOL 226 (H) 12/17/2019 0831   TRIG 298 (H) 01/28/2020 0716   HDL 32 (L) 01/28/2020 0716   HDL 39 (L) 12/17/2019 0831   CHOLHDL 3.9 01/28/2020 0716   VLDL 60 (H) 01/28/2020 0716   LDLCALC 33 01/28/2020 0716   LDLCALC 154 (H) 12/17/2019 0831   HgbA1c:  Lab Results  Component Value Date   HGBA1C 5.6 01/28/2020   Urine Drug Screen: No results found for: LABOPIA, COCAINSCRNUR, LABBENZ, AMPHETMU, THCU, LABBARB  Alcohol Level No results found for: ETH  IMAGING past 24 hours  MR ANGIO HEAD WO CONTRAST  Result Date:  01/28/2020 CLINICAL DATA:  64 year old male code stroke presentation with left MCA occlusion, left ACA A3 occlusion. Endovascular reperfusion yesterday. EXAM: MRI HEAD WITHOUT CONTRAST MRA HEAD WITHOUT CONTRAST TECHNIQUE: Multiplanar, multiecho pulse sequences of the brain and surrounding structures were obtained without intravenous contrast. Angiographic images of the head were obtained using MRA technique without contrast. COMPARISON:  CTA head and neck yesterday. FINDINGS: MRI HEAD FINDINGS Brain: Dense restricted diffusion in the left caudate and ventral putamen, with associated cytotoxic edema and caudate swelling. Petechial hemorrhage (Heidelberg Classification 1A, series 8, image 48). There  are also a few punctate foci of restricted diffusion in the nearby left inferior frontal gyrus and left insula, such as on series 2, image 28. Superimposed gyriform restricted diffusion along the left cingulate, and also involving posterior left superior frontal gyrus and parietal lobe (series 2, image 44 and series 4, image 17. T2 and FLAIR hyperintensity with gyral edema. No hemorrhage. One or 2 punctate areas of contralateral right caudate restricted diffusion, most notably series 2, image 38. T2 and FLAIR hyperintensity with no hemorrhage or mass effect. No other restricted diffusion. Chronic appearing hemosiderin in right inferior frontal gyrus has associated dark T2 rim and punctate internal T2 hyperintensity compatible with a small cavernous venous vascular malformation. No developmental venous anomaly associated on CTA. No regional edema or mass effect. Trace subarachnoid hemorrhage would be difficult to exclude such as in the left occipital lobe sulci on series 7, image 14. Also questionable trace blood in the left temporal and occipital horns, versus subependymal chronic microhemorrhages there. No ventriculomegaly. No midline shift. Basilar cisterns remain normal. Cervicomedullary junction and pituitary are  within normal limits. Outside of the acute findings there are a few scattered areas of subcortical white matter T2 and FLAIR hyperintensity. The right basal ganglia, thalami, brainstem and cerebellum appear negative. Vascular: Major intracranial vascular flow voids are preserved. Skull and upper cervical spine: Negative visible cervical spine, bone marrow signal. Sinuses/Orbits: Negative orbits. Mild to moderate paranasal sinus mucosal thickening. Other: Intubated.  Fluid in the pharynx.  Mastoids remain clear. MRA HEAD FINDINGS Antegrade flow in the posterior circulation with mildly dominant left V4 segment and no distal vertebral stenosis. Patent PICA origins, vertebrobasilar junction, basilar artery, AICA origins, SCA and PCA origins. Tortuous left P1 and fetal type left PCA on that side. Small right posterior communicating artery. Bilateral PCA branches are within normal limits. Antegrade flow in both ICA siphons, the left appears dominant with dominant left ACA. No siphon stenosis. Patent carotid termini. Normal ophthalmic and posterior communicating artery origins. Patent MCA and ACA origins. Anterior communicating artery and visible ACA branches are within normal limits. Restored patency on the left. Left MCA M1 segment and left MCA trifurcation are patent without stenosis. Left MCA branches appear within normal limits. Right MCA bifurcates early without stenosis. Visible right MCA branches are within normal limits. IMPRESSION: 1. Negative intracranial MRA, patent left MCA and ACA. 2. Confluent left basal ganglia infarct with edema and mild swelling. Left cingulate and additional left superior frontal / superior perirolandic gyral restricted diffusion with mild cortical edema. A few punctate foci of restricted diffusion elsewhere including at the right cingulate. 3. Heidelberg Classification 1A petechial hemorrhage at the caudate. Difficult to exclude trace left hemisphere SAH and IVH, repeat plain Head CT  could evaluate further. No significant intracranial mass effect. 4. 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 Electronically Signed   By: Genevie Ann M.D.   On: 01/28/2020 09:45   MR BRAIN WO CONTRAST  Result Date: 01/28/2020 CLINICAL DATA:  64 year old male code stroke presentation with left MCA occlusion, left ACA A3 occlusion. Endovascular reperfusion yesterday. EXAM: MRI HEAD WITHOUT CONTRAST MRA HEAD WITHOUT CONTRAST TECHNIQUE: Multiplanar, multiecho pulse sequences of the brain and surrounding structures were obtained without intravenous contrast. Angiographic images of the head were obtained using MRA technique without contrast. COMPARISON:  CTA head and neck yesterday. FINDINGS: MRI HEAD FINDINGS Brain: Dense restricted diffusion in the left caudate and ventral putamen, with associated cytotoxic edema and caudate swelling. Petechial hemorrhage (Heidelberg Classification 1A,  series 8, image 48). There are also a few punctate foci of restricted diffusion in the nearby left inferior frontal gyrus and left insula, such as on series 2, image 28. Superimposed gyriform restricted diffusion along the left cingulate, and also involving posterior left superior frontal gyrus and parietal lobe (series 2, image 44 and series 4, image 17. T2 and FLAIR hyperintensity with gyral edema. No hemorrhage. One or 2 punctate areas of contralateral right caudate restricted diffusion, most notably series 2, image 38. T2 and FLAIR hyperintensity with no hemorrhage or mass effect. No other restricted diffusion. Chronic appearing hemosiderin in right inferior frontal gyrus has associated dark T2 rim and punctate internal T2 hyperintensity compatible with a small cavernous venous vascular malformation. No developmental venous anomaly associated on CTA. No regional edema or mass effect. Trace subarachnoid hemorrhage would be difficult to exclude such as in the left occipital lobe sulci on series 7, image  14. Also questionable trace blood in the left temporal and occipital horns, versus subependymal chronic microhemorrhages there. No ventriculomegaly. No midline shift. Basilar cisterns remain normal. Cervicomedullary junction and pituitary are within normal limits. Outside of the acute findings there are a few scattered areas of subcortical white matter T2 and FLAIR hyperintensity. The right basal ganglia, thalami, brainstem and cerebellum appear negative. Vascular: Major intracranial vascular flow voids are preserved. Skull and upper cervical spine: Negative visible cervical spine, bone marrow signal. Sinuses/Orbits: Negative orbits. Mild to moderate paranasal sinus mucosal thickening. Other: Intubated.  Fluid in the pharynx.  Mastoids remain clear. MRA HEAD FINDINGS Antegrade flow in the posterior circulation with mildly dominant left V4 segment and no distal vertebral stenosis. Patent PICA origins, vertebrobasilar junction, basilar artery, AICA origins, SCA and PCA origins. Tortuous left P1 and fetal type left PCA on that side. Small right posterior communicating artery. Bilateral PCA branches are within normal limits. Antegrade flow in both ICA siphons, the left appears dominant with dominant left ACA. No siphon stenosis. Patent carotid termini. Normal ophthalmic and posterior communicating artery origins. Patent MCA and ACA origins. Anterior communicating artery and visible ACA branches are within normal limits. Restored patency on the left. Left MCA M1 segment and left MCA trifurcation are patent without stenosis. Left MCA branches appear within normal limits. Right MCA bifurcates early without stenosis. Visible right MCA branches are within normal limits. IMPRESSION: 1. Negative intracranial MRA, patent left MCA and ACA. 2. Confluent left basal ganglia infarct with edema and mild swelling. Left cingulate and additional left superior frontal / superior perirolandic gyral restricted diffusion with mild cortical  edema. A few punctate foci of restricted diffusion elsewhere including at the right cingulate. 3. Heidelberg Classification 1A petechial hemorrhage at the caudate. Difficult to exclude trace left hemisphere SAH and IVH, repeat plain Head CT could evaluate further. No significant intracranial mass effect. 4. 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 Electronically Signed   By: Genevie Ann M.D.   On: 01/28/2020 09:45   ECHOCARDIOGRAM COMPLETE  Result Date: 01/28/2020    ECHOCARDIOGRAM REPORT   Patient Name:   Dustin Schneider Date of Exam: 01/28/2020 Medical Rec #:  [id number]        Height:       69.0 in Accession #:    [id number]       Weight:       185.2 lb Date of Birth:  02/26/1956        BSA:          2.000 m Patient Age:  63 years         BP:           133/81 mmHg Patient Gender: M                HR:           52 bpm. Exam Location:  Inpatient Procedure: 2D Echo, 3D Echo, Color Doppler and Cardiac Doppler Indications:    Stroke i163.9  History:        Patient has prior history of Echocardiogram examinations, most                 recent 09/16/2018. Arrythmias:Atrial Fibrillation; Risk                 Factors:Hypertension.  Sonographer:    Raquel Sarna Senior RDCS Referring Phys: [id number] ASHISH ARORA  Sonographer Comments: Echo performed with patient supine and on artificial respirator. IMPRESSIONS  1. Left ventricular ejection fraction, by estimation, is 55 to 60%. The left ventricle has normal function. The left ventricle has no regional wall motion abnormalities. Left ventricular diastolic parameters are consistent with Grade II diastolic dysfunction (pseudonormalization).  2. Right ventricular systolic function is normal. The right ventricular size is normal. There is mildly elevated pulmonary artery systolic pressure.  3. Left atrial size was moderately dilated.  4. Right atrial size was moderately dilated.  5. The mitral valve is normal in structure. Mild mitral valve regurgitation.   6. The aortic valve is normal in structure. Aortic valve regurgitation is mild to moderate.  7. The inferior vena cava is dilated in size with >50% respiratory variability, suggesting right atrial pressure of 8 mmHg. FINDINGS  Left Ventricle: Left ventricular ejection fraction, by estimation, is 55 to 60%. The left ventricle has normal function. The left ventricle has no regional wall motion abnormalities. The left ventricular internal cavity size was normal in size. There is  no left ventricular hypertrophy. Left ventricular diastolic parameters are consistent with Grade II diastolic dysfunction (pseudonormalization). Right Ventricle: The right ventricular size is normal. No increase in right ventricular wall thickness. Right ventricular systolic function is normal. There is mildly elevated pulmonary artery systolic pressure. The tricuspid regurgitant velocity is 2.35  m/s, and with an assumed right atrial pressure of 15 mmHg, the estimated right ventricular systolic pressure is 81.4 mmHg. Left Atrium: Left atrial size was moderately dilated. Right Atrium: Right atrial size was moderately dilated. Pericardium: There is no evidence of pericardial effusion. Mitral Valve: The mitral valve is normal in structure. There is mild prolapse of of the mitral valve. Mild mitral valve regurgitation. Tricuspid Valve: The tricuspid valve is normal in structure. Tricuspid valve regurgitation is trivial. Aortic Valve: The aortic valve is normal in structure. Aortic valve regurgitation is mild to moderate. Pulmonic Valve: The pulmonic valve was normal in structure. Pulmonic valve regurgitation is trivial. Aorta: The aortic root and ascending aorta are structurally normal, with no evidence of dilitation. Venous: The inferior vena cava is dilated in size with greater than 50% respiratory variability, suggesting right atrial pressure of 8 mmHg. IAS/Shunts: No atrial level shunt detected by color flow Doppler.  LEFT VENTRICLE PLAX 2D  LVIDd:         5.40 cm  Diastology LVIDs:         3.30 cm  LV e' lateral:   12.00 cm/s LV PW:         0.70 cm  LV E/e' lateral: 8.3 LV IVS:        0.70 cm  LV e' medial:    10.20 cm/s LVOT diam:     2.20 cm  LV E/e' medial:  9.8 LV SV:         82 LV SV Index:   41 LVOT Area:     3.80 cm  RIGHT VENTRICLE RV S prime:     9.36 cm/s TAPSE (M-mode): 2.4 cm LEFT ATRIUM             Index       RIGHT ATRIUM           Index LA diam:        2.90 cm 1.45 cm/m  RA Area:     25.70 cm LA Vol (A2C):   79.6 ml 39.81 ml/m RA Volume:   71.90 ml  35.96 ml/m LA Vol (A4C):   49.8 ml 24.91 ml/m LA Biplane Vol: 67.8 ml 33.91 ml/m  AORTIC VALVE LVOT Vmax:   102.00 cm/s LVOT Vmean:  61.600 cm/s LVOT VTI:    0.216 m  AORTA Ao Root diam: 3.60 cm Ao Asc diam:  3.10 cm MITRAL VALVE               TRICUSPID VALVE MV Area (PHT): 4.31 cm    TR Peak grad:   22.1 mmHg MV Decel Time: 176 msec    TR Vmax:        235.00 cm/s MV E velocity: 99.80 cm/s MV A velocity: 32.60 cm/s  SHUNTS MV E/A ratio:  3.06        Systemic VTI:  0.22 m                            Systemic Diam: 2.20 cm Glori Bickers MD Electronically signed by Glori Bickers MD Signature Date/Time: 01/28/2020/2:18:14 PM    Final     PHYSICAL EXAM  Temp:  [98.8 F (37.1 C)-99.1 F (37.3 C)] 99 F (37.2 C) (06/18 0800) Pulse Rate:  [51-80] 76 (06/18 0819) Resp:  [10-18] 13 (06/18 0819) BP: (99-161)/(63-90) 145/86 (06/18 0819) SpO2:  [96 %-100 %] 99 % (06/18 0822) FiO2 (%):  [40 %] 40 % (06/18 0822)  General - Well nourished, well developed, extubated and tolerating well.  Ophthalmologic - fundi not visualized due to noncooperation.  Cardiovascular - irregularly irregular heart rate and rhythm  Neuro - extubated, awake alert, eyes open, expressive aphasia, nonverbal, not name or repeat. However, able to follow simple central and peripheral commands on the left. Eyes in mid position, EOMI. Able to blink to visual threat bilaterally, PERRL. Right facial droop.   Tongue protrusion midline. Left UE at least 4/5 and against gravity, LLE at least 3+/5. However, on pain stimulation, 2-/5 RUE and 0/5 RLE. DTR diminished and no babinski. Sensation, coordination not cooperative and gait not tested.   ASSESSMENT/PLAN Dustin Schneider is a 64 y.o. male with history of paroxysmal A. fib on chronic Xarelto, nonischemic cardiomyopathy and hypertension presenting with L forced gaze and R hemiplegia with nausea and vomiting. Not a tPA candidate as on xarelto. Taken to IR for L MCA occlusion.  Stroke:   L MCA and ACA scattered infarcts s/p TICI3 revascularization of L MCA & A3, infarct embolic secondary to known AF on Xarelto  Code Stroke CT head Hyperdense L M2. ASPECTS 10.     CTA head & neck L MCA bifurcation occlusion w/ little distal flow. Minimal collateral flow.  Cerebral angio distal L M1 occlusion, distal L  A3 occlusion. TICI 3 revascularization MCA and ACA   Post IR CT no hemorrhage  MRI  L basal ganglia infarct w/ mild edema. L cingulate and L superior frontal lobe infarcts w/ mild cortical edema. Few punctate infarcts R cingulate. Petechial hemorrhage at caudate. Small R cavernous malformation.   MRA  L MCA and ACA open.   2D Echo - EF 55 - 60%. No cardiac source of emboli identified.   LDL 33  HgbA1c - 5.6   SCDs for VTE prophylaxis  Xarelto daily prior to admission, now on aspirin 325 po. Will consider to switch to eliquis in 3-5 days post stroke  Therapy recommendations: CIR  Disposition:  pending   Acute Respiratory Failure Possible Aspiration  N&V in ED  Intubated for IR, left intubated following given possible aspiration  Off sedation  Extubated 6/18  CXR unremarkable  WBC 9.9  CCM on board  Atrial Fibrillation  Home anticoagulation:  Xarelto (rivaroxaban) daily  . Home meds:  metoprolol 100 daily, tikosyn 500 bid . Not an AC at this time given post IR petechial hemorrhage.  . Now on ASA . Will consider to  switch to eliquis in 3-5 days.    Hypertension  Home meds:  metoprolol 100 daily, valsartan 320 . BP goal < 160 . Off phenylephrine  . Long-term BP goal normotensive  Hyperlipidemia  Home meds:  crestor 10, resumed in hospital  LDL 33, goal < 70  Continue statin at discharge  Dysphagia . Secondary to stroke . NPO . On cortrak   On TF @ 84 . Speech on board   Other Stroke Risk Factors  Advanced age  NICM w/ EF 45% PTA  (EF 21 - 60% by echo this admission) - on tikosyn PTA - Dr. [name] pt - contacted Dr. [name] office and was told to re-start tikosyn at follow up with Dr. [name] after discharge.   Other Active Problems    Hospital day # 2  This patient is critically ill due to left MCA occlusion with stroke s/p EVT, afib on AC, respiratory failure with intubation and at significant risk of neurological worsening, death form recurrent stroke, hemorrhagic conversion, seizure, heart failure, respiratory failure. This patient's care requires constant monitoring of vital signs, hemodynamics, respiratory and cardiac monitoring, review of multiple databases, neurological assessment, discussion with family, other specialists and medical decision making of high complexity. I spent 35 minutes of neurocritical care time in the care of this patient. I had long discussion with son at bedside as well as pt MD brother over the phone, updated pt current condition, treatment plan and potential prognosis, and answered all the questions. He expressed understanding and appreciation. I also discussed with Dr. Norma Fredrickson.  Rosalin Hawking, MD PhD Stroke Neurology 01/29/2020 8:53 PM   To contact Stroke Continuity provider, please refer to [email or web address] After hours, contact General Neurology

## 2020-01-29 NOTE — Evaluation (Signed)
Physical Therapy Evaluation Patient Details Name: Dustin Schneider MRN: [id number] DOB: 03-Aug-1956 Today's Date: 01/29/2020  ASSESSMENT: Pt presents to PT with deficits in functional mobility, gait, balance, strength, power, communication, perception. Pt with pushing to R side noted during sitting, does begin to self-correct some with verbal and tactile cueing while sitting during session. Pt with significant R weakness although noted to have some flickers of muscle activity in RUE and RLE during session. Pt does not demonstrate knee buckling during transfer however weight is shifted to left side to reduce risk of pushing. Pt does require significant physical assistance for all functional mobility at this time and will benefit from continued acute PT services to reduce falls risk and aide in improving mobility quality. PT currently recommends CIR as the pt was independent prior to admission and demonstrate the potential to make significant functional mobility gains with high intensity inpatient therapy services.   01/29/20 1212  PT Visit Information  Last PT Received On 01/29/20  Assistance Needed +2  PT/OT/SLP Co-Evaluation/Treatment Yes  Reason for Co-Treatment Complexity of the patient's impairments (multi-system involvement);Necessary to address cognition/behavior during functional activity;For patient/therapist safety;To address functional/ADL transfers  History of Present Illness 64 y.o. male with history of paroxysmal A. fib on chronic Xarelto, nonischemic cardiomyopathy and hypertension.  Patient was at [facility] today.  Patient was noted to come out of the bathroom and suddenly collapsed by bystanders. On arrival they noted that he had a left forced gaze and right hemiplegia.  Patient was also nauseous and vomiting. of head and neck was immediately obtained and showed a dense left MCA. Pt underwent emergent L MCA and ACA revascularization on 6/16.  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 Slowed processing initially and requiring verbal cues  Difficult to assess due to Impaired communication  Upper Extremity Assessment  Upper Extremity Assessment Defer to OT evaluation  Lower Extremity Assessment  Lower Extremity Assessment RLE deficits/detail  RLE Deficits / Details withdraws to pain, increased knee flexor tone noted, PROM WFL, flickers of AROM noted at hip and with knee extensors  Cervical / Trunk Assessment  Cervical / Trunk Assessment Normal  Bed Mobility  Overal bed mobility Needs Assistance  Bed Mobility Supine to Sit  Supine to sit Max assist;+2 for physical assistance  Transfers  Overall transfer level Needs assistance  Equipment used 2 person hand held assist  Transfers Sit to/from [employer] Transfers  Sit to  Stand Max assist;+2 physical assistance  Stand pivot transfers Max assist;+2 physical assistance  General transfer comment OT shifting pt toward L side to avoid pushing and R lateral lean, pt with some noted R knee flexion during standing, no buckling however WB on RLE may have not been enough to cause buckling at this time.  Modified Rankin (Stroke Patients Only)  Pre-Morbid Rankin Score 0  Modified Rankin 5  Balance  Overall balance assessment Needs assistance  Sitting-balance support Single extremity supported;Feet supported  Sitting balance-Leahy Scale Poor  Sitting balance - Comments modA progressing to minA, pt needs cues to limit pushing toward R side and to correct R lateral lean  Postural control Right lateral lean  Standing balance support Bilateral upper extremity supported  Standing balance-Leahy Scale Zero  Standing balance comment maxA to maintain static standing  General Comments  General comments (skin integrity, edema, etc.) VSS on RA, pt's significant other is present and inquisitive about prognosis, post-acute rehab, and home renovations. Pt on 2L [place] initially, removed for transfer and left off at end of session as pt had stable sats. RN aware  PT - End of Session  Equipment Utilized During Treatment Oxygen  Activity Tolerance Patient tolerated treatment well  Patient left in chair;with chair alarm set;with call bell/phone within reach;with family/visitor present  Nurse Communication Mobility status;Need for lift equipment  PT Assessment  PT Recommendation/Assessment Patient needs continued PT services  PT Visit Diagnosis Other abnormalities of gait and mobility (R26.89);Muscle weakness (generalized) (M62.81);Other symptoms and signs involving the nervous system (R29.898)  PT Problem List Decreased strength;Decreased activity tolerance;Decreased mobility;Decreased balance;Decreased coordination;Decreased cognition;Decreased knowledge of use of DME;Decreased safety  awareness;Decreased knowledge of precautions  PT Plan  PT Frequency (ACUTE ONLY) Min 4X/week  PT Treatment/Interventions (ACUTE ONLY) DME instruction;Gait training;Stair training;Functional mobility training;Therapeutic activities;Therapeutic exercise;Balance training;Neuromuscular re-education;Cognitive remediation;Patient/family education  AM-PAC PT "6 Clicks" Mobility Outcome Measure (Version 2)  Help needed turning from your back to your side while in a flat bed without using bedrails? 2  Help needed moving from lying on your back to sitting on the side of a flat bed without using bedrails? 1  Help needed moving to and from a bed to a chair (including a wheelchair)? 1  Help needed standing up from a chair using your arms (e.g., wheelchair or bedside chair)? 1  Help needed to walk in hospital room? 1  Help needed climbing 3-5 steps with a railing?  1  6 Click Score 7  Consider Recommendation of Discharge To: CIR/SNF/LTACH  PT Recommendation  Recommendations for Other Services Rehab consult  Follow Up Recommendations CIR  PT equipment Wheelchair (measurements PT);Wheelchair cushion (measurements PT);Hospital bed (mechanical lift if home today)  Acute Rehab PT Goals  Patient Stated Goal To improve mobility and to become as independent as possible  PT Goal Formulation  (with significant other)  Time For Goal Achievement 02/12/20  Potential to Achieve Goals Good  PT Time Calculation  PT Start Time (ACUTE ONLY) 1135  PT Stop Time (ACUTE ONLY) 1212  PT Time Calculation (min) (ACUTE ONLY) 37 min  PT General Charges  $$ ACUTE PT VISIT 1 Visit  PT Evaluation  $PT Eval Moderate Complexity 1 Mod  Written Expression  Dominant Hand Right   Zenaida Niece, PT, DPT Acute Rehabilitation Pager: [phone]   Zenaida Niece 01/29/2020, 4:19 PM

## 2020-01-29 NOTE — Progress Notes (Signed)
Rehab Admissions Coordinator Note:  Per PT, OT, and SLP recommendation, this Patient was screened by Raechel Ache for appropriateness for an Inpatient Acute Rehab Consult.  At this time, we are recommending Inpatient Rehab consult. AC will contact MD to request order.   Raechel Ache 01/29/2020, 6:02 PM  I can be reached at [phone].

## 2020-01-29 NOTE — Progress Notes (Signed)
Initial Nutrition Assessment  RD working remotely.  DOCUMENTATION CODES:   Not applicable  INTERVENTION:   Initiate tube feeding via Cortrak: - Osmolite 1.5 @ 55 ml/hr (1320 ml/day) - Pro-stat 30 ml BID - Free water per MD  Tube feeding regimen provides 2180 kcal, 113 grams of protein, and 1006 ml of H2O (meets 100% of needs).  NUTRITION DIAGNOSIS:   Inadequate oral intake related to lethargy/confusion as evidenced by NPO status.  GOAL:   Patient will meet greater than or equal to 90% of their needs  MONITOR:   Diet advancement, Labs, Weight trends, TF tolerance  REASON FOR ASSESSMENT:   Consult Enteral/tube feeding initiation and management  ASSESSMENT:   64 year old male who presented on 6/16 with aphasia and right-sided hemiplegia. PMH of atrial fibrillation, nonischemic cardiomyopathy, and HTN. CTA of head and neck showing dense left MCA. Pt intubated in the ED.   6/16 - s/p diagnostic cerebral angiogram and mechanical thrombectomy 6/18 - extubated  Per CCM, follows commands on the left side with good movement but right side is flaccid with dense hemiplegia.  Pt is now off Cleviprex.  Weight trending up over the last 2 years per weight records.  Per RN edema assessment, pt with non-pitting edema to LUE.  Spoke with RN via phone call. Pt did not pass bedside swallow evaluation. MD would like to proceed with Cortrak placement.  Medications reviewed and include: colace, protonix, miralax, Klor-con 40 mEq once IVF: NS @ 75 ml/hr  Labs reviewed: HDL 32, TG 298  UOP: 825 ml x 24 hours  NUTRITION - FOCUSED PHYSICAL EXAM:  Unable to complete at this time. RD working remotely.  Diet Order:   Diet Order            Diet NPO time specified  Diet effective now                 EDUCATION NEEDS:   No education needs have been identified at this time  Skin:  Skin Assessment: Skin Integrity Issues: Other: puncture wound right groin  Last BM:  no  documented BM  Height:   Ht Readings from Last 1 Encounters:  01/27/20 5\' 9"  (1.753 m)    Weight:   Wt Readings from Last 1 Encounters:  01/27/20 84 kg    Ideal Body Weight:  72.7 kg  BMI:  Body mass index is 27.35 kg/m.  Estimated Nutritional Needs:   Kcal:  2100-2300  Protein:  100-115 grams  Fluid:  >/= 2.0 L    Gaynell Face, MS, RD, LDN Inpatient Clinical Dietitian Pager: [phone] Weekend/After Hours: [phone]

## 2020-01-29 NOTE — Progress Notes (Signed)
   Doing well on SBT Gag +  Plan Extubate    SIGNATURE    Dr. Brand Males, M.D., F.C.C.P,  Pulmonary and Critical Care Medicine Staff Physician, [place] Director - Interstitial Lung Disease  Program  P[facility] [place] [facility] [place], [place], [address]  Pager: [phone], If no answer or between  15:00h - 7:00h: call [phone] Telephone: [phone]  9:34 AM 01/29/2020

## 2020-01-29 NOTE — Progress Notes (Signed)
Referring Physician(s): Dr. Rory Percy  Supervising Physician: Pedro Earls  Patient Status:  [name] - In-pt  Chief Complaint: Code Stroke  Subjective: Recently extubated at the time of visit this AM. Appears alert and follows commands, but no effort to communicate.  No movement on right.   Allergies: Patient has no known allergies.  Medications: Prior to Admission medications   Medication Sig Start Date End Date Taking? Authorizing Provider  dofetilide (TIKOSYN) 500 MCG capsule Take 1 capsule (500 mcg total) by mouth 2 (two) times daily. 08/20/19  Yes [name], MD  MAGNESIUM CITRATE PO Take 250 mg by mouth in the morning and at bedtime.    Yes [provider]  metoprolol succinate (TOPROL-XL) 100 MG 24 hr tablet TAKE 1 TABLET BY MOUTH TWICE DAILY. Patient taking differently: Take 100 mg by mouth 2 (two) times daily.  01/05/20  Yes [name], MD  Potassium 99 MG TABS Take 99 mg by mouth daily.   Yes [provider]  rosuvastatin (CRESTOR) 10 MG tablet Take 1 tablet (10 mg total) by mouth daily. 12/21/19 03/20/20 Yes [name], MD  valsartan (DIOVAN) 320 MG tablet TAKE ONE TABLET AT BEDTIME. Patient taking differently: Take 320 mg by mouth at bedtime.  01/05/20  Yes [name], MD  XARELTO 20 MG TABS tablet TAKE ONE TABLET IN THE EVENING AFTER DINNER. Patient taking differently: Take 20 mg by mouth daily after supper.  11/24/19  Yes [name], MD     Vital Signs: BP 132/86   Pulse 63   Temp 97.7 F (36.5 C) (Oral)   Resp 18   Ht 5\' 9"  (1.753 m)   Wt 185 lb 3 oz (84 kg)   SpO2 98%   BMI 27.35 kg/m   Physical Exam  Extubated, alert, no communication Neuro exam: Expressive aphasia. Facial droop.  Follows commands. Moves left side to command, however no movement noted on the right.   Groin: soft, no bruising, no evidence of hematoma or pseudoaneurysm. Pulse:  Distal R DP and PT pulses intact.   Imaging: MR ANGIO HEAD WO CONTRAST  Result  Date: 01/28/2020 CLINICAL DATA:  64 year old male code stroke presentation with left MCA occlusion, left ACA A3 occlusion. Endovascular reperfusion yesterday. EXAM: MRI HEAD WITHOUT CONTRAST MRA HEAD WITHOUT CONTRAST TECHNIQUE: Multiplanar, multiecho pulse sequences of the brain and surrounding structures were obtained without intravenous contrast. Angiographic images of the head were obtained using MRA technique without contrast. COMPARISON:  CTA head and neck yesterday. FINDINGS: MRI HEAD FINDINGS Brain: Dense restricted diffusion in the left caudate and ventral putamen, with associated cytotoxic edema and caudate swelling. Petechial hemorrhage (Heidelberg Classification 1A, series 8, image 48). There are also a few punctate foci of restricted diffusion in the nearby left inferior frontal gyrus and left insula, such as on series 2, image 28. Superimposed gyriform restricted diffusion along the left cingulate, and also involving posterior left superior frontal gyrus and parietal lobe (series 2, image 44 and series 4, image 17. T2 and FLAIR hyperintensity with gyral edema. No hemorrhage. One or 2 punctate areas of contralateral right caudate restricted diffusion, most notably series 2, image 38. T2 and FLAIR hyperintensity with no hemorrhage or mass effect. No other restricted diffusion. Chronic appearing hemosiderin in right inferior frontal gyrus has associated dark T2 rim and punctate internal T2 hyperintensity compatible with a small cavernous venous vascular malformation. No developmental venous anomaly associated on CTA. No regional edema or mass effect. Trace subarachnoid hemorrhage would  be difficult to exclude such as in the left occipital lobe sulci on series 7, image 14. Also questionable trace blood in the left temporal and occipital horns, versus subependymal chronic microhemorrhages there. No ventriculomegaly. No midline shift. Basilar cisterns remain normal. Cervicomedullary junction and pituitary are  within normal limits. Outside of the acute findings there are a few scattered areas of subcortical white matter T2 and FLAIR hyperintensity. The right basal ganglia, thalami, brainstem and cerebellum appear negative. Vascular: Major intracranial vascular flow voids are preserved. Skull and upper cervical spine: Negative visible cervical spine, bone marrow signal. Sinuses/Orbits: Negative orbits. Mild to moderate paranasal sinus mucosal thickening. Other: Intubated.  Fluid in the pharynx.  Mastoids remain clear. MRA HEAD FINDINGS Antegrade flow in the posterior circulation with mildly dominant left V4 segment and no distal vertebral stenosis. Patent PICA origins, vertebrobasilar junction, basilar artery, AICA origins, SCA and PCA origins. Tortuous left P1 and fetal type left PCA on that side. Small right posterior communicating artery. Bilateral PCA branches are within normal limits. Antegrade flow in both ICA siphons, the left appears dominant with dominant left ACA. No siphon stenosis. Patent carotid termini. Normal ophthalmic and posterior communicating artery origins. Patent MCA and ACA origins. Anterior communicating artery and visible ACA branches are within normal limits. Restored patency on the left. Left MCA M1 segment and left MCA trifurcation are patent without stenosis. Left MCA branches appear within normal limits. Right MCA bifurcates early without stenosis. Visible right MCA branches are within normal limits. IMPRESSION: 1. Negative intracranial MRA, patent left MCA and ACA. 2. Confluent left basal ganglia infarct with edema and mild swelling. Left cingulate and additional left superior frontal / superior perirolandic gyral restricted diffusion with mild cortical edema. A few punctate foci of restricted diffusion elsewhere including at the right cingulate. 3. Heidelberg Classification 1A petechial hemorrhage at the caudate. Difficult to exclude trace left hemisphere SAH and IVH, repeat plain Head CT  could evaluate further. No significant intracranial mass effect. 4. 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 Electronically Signed   By: Genevie Ann M.D.   On: 01/28/2020 09:45   MR BRAIN WO CONTRAST  Result Date: 01/28/2020 CLINICAL DATA:  64 year old male code stroke presentation with left MCA occlusion, left ACA A3 occlusion. Endovascular reperfusion yesterday. EXAM: MRI HEAD WITHOUT CONTRAST MRA HEAD WITHOUT CONTRAST TECHNIQUE: Multiplanar, multiecho pulse sequences of the brain and surrounding structures were obtained without intravenous contrast. Angiographic images of the head were obtained using MRA technique without contrast. COMPARISON:  CTA head and neck yesterday. FINDINGS: MRI HEAD FINDINGS Brain: Dense restricted diffusion in the left caudate and ventral putamen, with associated cytotoxic edema and caudate swelling. Petechial hemorrhage (Heidelberg Classification 1A, series 8, image 48). There are also a few punctate foci of restricted diffusion in the nearby left inferior frontal gyrus and left insula, such as on series 2, image 28. Superimposed gyriform restricted diffusion along the left cingulate, and also involving posterior left superior frontal gyrus and parietal lobe (series 2, image 44 and series 4, image 17. T2 and FLAIR hyperintensity with gyral edema. No hemorrhage. One or 2 punctate areas of contralateral right caudate restricted diffusion, most notably series 2, image 38. T2 and FLAIR hyperintensity with no hemorrhage or mass effect. No other restricted diffusion. Chronic appearing hemosiderin in right inferior frontal gyrus has associated dark T2 rim and punctate internal T2 hyperintensity compatible with a small cavernous venous vascular malformation. No developmental venous anomaly associated on CTA. No regional edema or mass  effect. Trace subarachnoid hemorrhage would be difficult to exclude such as in the left occipital lobe sulci on series 7, image  14. Also questionable trace blood in the left temporal and occipital horns, versus subependymal chronic microhemorrhages there. No ventriculomegaly. No midline shift. Basilar cisterns remain normal. Cervicomedullary junction and pituitary are within normal limits. Outside of the acute findings there are a few scattered areas of subcortical white matter T2 and FLAIR hyperintensity. The right basal ganglia, thalami, brainstem and cerebellum appear negative. Vascular: Major intracranial vascular flow voids are preserved. Skull and upper cervical spine: Negative visible cervical spine, bone marrow signal. Sinuses/Orbits: Negative orbits. Mild to moderate paranasal sinus mucosal thickening. Other: Intubated.  Fluid in the pharynx.  Mastoids remain clear. MRA HEAD FINDINGS Antegrade flow in the posterior circulation with mildly dominant left V4 segment and no distal vertebral stenosis. Patent PICA origins, vertebrobasilar junction, basilar artery, AICA origins, SCA and PCA origins. Tortuous left P1 and fetal type left PCA on that side. Small right posterior communicating artery. Bilateral PCA branches are within normal limits. Antegrade flow in both ICA siphons, the left appears dominant with dominant left ACA. No siphon stenosis. Patent carotid termini. Normal ophthalmic and posterior communicating artery origins. Patent MCA and ACA origins. Anterior communicating artery and visible ACA branches are within normal limits. Restored patency on the left. Left MCA M1 segment and left MCA trifurcation are patent without stenosis. Left MCA branches appear within normal limits. Right MCA bifurcates early without stenosis. Visible right MCA branches are within normal limits. IMPRESSION: 1. Negative intracranial MRA, patent left MCA and ACA. 2. Confluent left basal ganglia infarct with edema and mild swelling. Left cingulate and additional left superior frontal / superior perirolandic gyral restricted diffusion with mild cortical  edema. A few punctate foci of restricted diffusion elsewhere including at the right cingulate. 3. Heidelberg Classification 1A petechial hemorrhage at the caudate. Difficult to exclude trace left hemisphere SAH and IVH, repeat plain Head CT could evaluate further. No significant intracranial mass effect. 4. 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 Electronically Signed   By: Genevie Ann M.D.   On: 01/28/2020 09:45   DG CHEST PORT 1 VIEW  Result Date: 01/27/2020 CLINICAL DATA:  Stroke, intubated EXAM: PORTABLE CHEST 1 VIEW COMPARISON:  None. FINDINGS: Single frontal view of the chest demonstrates unremarkable cardiac silhouette. Ectasia of the thoracic aorta. Endotracheal tube overlies tracheal air column tip well above carina. Enteric catheter passes below diaphragm tip and side port project over gastric fundus. No airspace disease, effusion, or pneumothorax. No acute bony abnormalities. IMPRESSION: 1. Support devices as above. 2. No acute intrathoracic process. Electronically Signed   By: Randa Ngo M.D.   On: 01/27/2020 19:08   ECHOCARDIOGRAM COMPLETE  Result Date: 01/28/2020    ECHOCARDIOGRAM REPORT   Patient Name:   Dustin Schneider Date of Exam: 01/28/2020 Medical Rec #:  [id number]        Height:       69.0 in Accession #:    [id number]       Weight:       185.2 lb Date of Birth:  [birth year]        BSA:          2.000 m Patient Age:    64 years         BP:           133/81 mmHg Patient Gender: M  HR:           52 bpm. Exam Location:  Inpatient Procedure: 2D Echo, 3D Echo, Color Doppler and Cardiac Doppler Indications:    Stroke i163.9  History:        Patient has prior history of Echocardiogram examinations, most                 recent 09/16/2018. Arrythmias:Atrial Fibrillation; Risk                 Factors:Hypertension.  Sonographer:    Raquel Sarna Senior RDCS Referring Phys: [id number] ASHISH ARORA  Sonographer Comments: Echo performed with patient supine and on  artificial respirator. IMPRESSIONS  1. Left ventricular ejection fraction, by estimation, is 55 to 60%. The left ventricle has normal function. The left ventricle has no regional wall motion abnormalities. Left ventricular diastolic parameters are consistent with Grade II diastolic dysfunction (pseudonormalization).  2. Right ventricular systolic function is normal. The right ventricular size is normal. There is mildly elevated pulmonary artery systolic pressure.  3. Left atrial size was moderately dilated.  4. Right atrial size was moderately dilated.  5. The mitral valve is normal in structure. Mild mitral valve regurgitation.  6. The aortic valve is normal in structure. Aortic valve regurgitation is mild to moderate.  7. The inferior vena cava is dilated in size with >50% respiratory variability, suggesting right atrial pressure of 8 mmHg. FINDINGS  Left Ventricle: Left ventricular ejection fraction, by estimation, is 55 to 60%. The left ventricle has normal function. The left ventricle has no regional wall motion abnormalities. The left ventricular internal cavity size was normal in size. There is  no left ventricular hypertrophy. Left ventricular diastolic parameters are consistent with Grade II diastolic dysfunction (pseudonormalization). Right Ventricle: The right ventricular size is normal. No increase in right ventricular wall thickness. Right ventricular systolic function is normal. There is mildly elevated pulmonary artery systolic pressure. The tricuspid regurgitant velocity is 2.35  m/s, and with an assumed right atrial pressure of 15 mmHg, the estimated right ventricular systolic pressure is 50.0 mmHg. Left Atrium: Left atrial size was moderately dilated. Right Atrium: Right atrial size was moderately dilated. Pericardium: There is no evidence of pericardial effusion. Mitral Valve: The mitral valve is normal in structure. There is mild prolapse of of the mitral valve. Mild mitral valve regurgitation.  Tricuspid Valve: The tricuspid valve is normal in structure. Tricuspid valve regurgitation is trivial. Aortic Valve: The aortic valve is normal in structure. Aortic valve regurgitation is mild to moderate. Pulmonic Valve: The pulmonic valve was normal in structure. Pulmonic valve regurgitation is trivial. Aorta: The aortic root and ascending aorta are structurally normal, with no evidence of dilitation. Venous: The inferior vena cava is dilated in size with greater than 50% respiratory variability, suggesting right atrial pressure of 8 mmHg. IAS/Shunts: No atrial level shunt detected by color flow Doppler.  LEFT VENTRICLE PLAX 2D LVIDd:         5.40 cm  Diastology LVIDs:         3.30 cm  LV e' lateral:   12.00 cm/s LV PW:         0.70 cm  LV E/e' lateral: 8.3 LV IVS:        0.70 cm  LV e' medial:    10.20 cm/s LVOT diam:     2.20 cm  LV E/e' medial:  9.8 LV SV:         82 LV SV Index:   41 LVOT  Area:     3.80 cm  RIGHT VENTRICLE RV S prime:     9.36 cm/s TAPSE (M-mode): 2.4 cm LEFT ATRIUM             Index       RIGHT ATRIUM           Index LA diam:        2.90 cm 1.45 cm/m  RA Area:     25.70 cm LA Vol (A2C):   79.6 ml 39.81 ml/m RA Volume:   71.90 ml  35.96 ml/m LA Vol (A4C):   49.8 ml 24.91 ml/m LA Biplane Vol: 67.8 ml 33.91 ml/m  AORTIC VALVE LVOT Vmax:   102.00 cm/s LVOT Vmean:  61.600 cm/s LVOT VTI:    0.216 m  AORTA Ao Root diam: 3.60 cm Ao Asc diam:  3.10 cm MITRAL VALVE               TRICUSPID VALVE MV Area (PHT): 4.31 cm    TR Peak grad:   22.1 mmHg MV Decel Time: 176 msec    TR Vmax:        235.00 cm/s MV E velocity: 99.80 cm/s MV A velocity: 32.60 cm/s  SHUNTS MV E/A ratio:  3.06        Systemic VTI:  0.22 m                            Systemic Diam: 2.20 cm Glori Bickers MD Electronically signed by Glori Bickers MD Signature Date/Time: 01/28/2020/2:18:14 PM    Final    CT HEAD CODE STROKE WO CONTRAST  Result Date: 01/27/2020 CLINICAL DATA:  Code stroke.  Stroke.  Last seen normal 1 hour  ago. EXAM: CT HEAD WITHOUT CONTRAST TECHNIQUE: Contiguous axial images were obtained from the base of the skull through the vertex without intravenous contrast. COMPARISON:  None. FINDINGS: Brain: No evidence of acute infarction, hemorrhage, hydrocephalus, extra-axial collection or mass lesion/mass effect. Vascular: Hyperdense left M2 segment in the sylvian fissure compatible with acute thrombus. No other hyperdense vessel. Skull: Negative Sinuses/Orbits: Retention cyst left maxillary sinus. Negative orbit. Other: None ASPECTS ([place] Stroke Program Early CT Score) - Ganglionic level infarction (caudate, lentiform nuclei, internal capsule, insula, M1-M3 cortex): 7 - Supraganglionic infarction (M4-M6 cortex): 3 Total score (0-10 with 10 being normal): 10 IMPRESSION: 1. Hyperdense left M2 branch compatible with acute thrombus. No acute infarct or hemorrhage 2. ASPECTS is 10 3. These results were called by telephone at the time of interpretation on 01/27/2020 at 1:56 pm to provider Rory Percy , who verbally acknowledged these results. Electronically Signed   By: Franchot Gallo M.D.   On: 01/27/2020 13:57   CT ANGIO HEAD CODE STROKE  Result Date: 01/27/2020 CLINICAL DATA:  Stroke.  Aphasia. EXAM: CT ANGIOGRAPHY HEAD AND NECK TECHNIQUE: Multidetector CT imaging of the head and neck was performed using the standard protocol during bolus administration of intravenous contrast. Multiplanar CT image reconstructions and MIPs were obtained to evaluate the vascular anatomy. Carotid stenosis measurements (when applicable) are obtained utilizing NASCET criteria, using the distal internal carotid diameter as the denominator. CONTRAST:  54mL OMNIPAQUE IOHEXOL 350 MG/ML SOLN COMPARISON:  CT head 01/27/2020 FINDINGS: CTA NECK FINDINGS Aortic arch: 4 vessel arch. Left vertebral artery origin from the arch. Proximal great vessels widely patent. Minimal atherosclerotic disease in the aortic arch. Right carotid system: Right carotid  widely patent without significant stenosis. Left carotid system: Mild atherosclerotic  disease left carotid bifurcation without significant stenosis. Vertebral arteries: Both vertebral arteries patent to the basilar without significant stenosis. Skeleton: Cervical spondylosis.  No acute skeletal abnormality. Other neck: Negative for soft tissue mass or edema in the neck. Upper chest: Lung apices clear bilaterally. Review of the MIP images confirms the above findings CTA HEAD FINDINGS Anterior circulation: Mild atherosclerotic disease in the cavernous carotid bilaterally without significant stenosis. Occlusion of the left middle cerebral artery at the bifurcation. There is a small early branch on the left MCA which extends to the parietal lobe. There is clot within this vessel. However there is occlusion of the majority of the superior and inferior divisions of the left MCA . Minimal collateral flow on delayed imaging. Both anterior cerebral arteries widely patent without stenosis. Right A1 segment is hypoplastic. Right middle cerebral artery widely patent without stenosis. Posterior circulation: Both vertebral arteries patent to the basilar. PICA patent bilaterally. Basilar widely patent. AICA, superior cerebellar, and posterior cerebral arteries patent bilaterally without stenosis. Fetal origin left posterior cerebral artery. Venous sinuses: Normal venous enhancement. Anatomic variants: None Review of the MIP images confirms the above findings IMPRESSION: 1. Acute occlusion left MCA bifurcation with little flow in the superior and inferior divisions left MCA. There is an early branch supplying a part of the left parietal lobe. Minimal collateral flow. 2. No other intracranial stenosis or occlusion. 3. No significant carotid or vertebral artery stenosis in the neck. 4. These results were called by telephone at the time of interpretation on 01/27/2020 at 2:41 pm to provider Rory Percy , who verbally acknowledged these  results. Electronically Signed   By: Franchot Gallo M.D.   On: 01/27/2020 14:41   CT ANGIO NECK CODE STROKE  Result Date: 01/27/2020 CLINICAL DATA:  Stroke.  Aphasia. EXAM: CT ANGIOGRAPHY HEAD AND NECK TECHNIQUE: Multidetector CT imaging of the head and neck was performed using the standard protocol during bolus administration of intravenous contrast. Multiplanar CT image reconstructions and MIPs were obtained to evaluate the vascular anatomy. Carotid stenosis measurements (when applicable) are obtained utilizing NASCET criteria, using the distal internal carotid diameter as the denominator. CONTRAST:  89mL OMNIPAQUE IOHEXOL 350 MG/ML SOLN COMPARISON:  CT head 01/27/2020 FINDINGS: CTA NECK FINDINGS Aortic arch: 4 vessel arch. Left vertebral artery origin from the arch. Proximal great vessels widely patent. Minimal atherosclerotic disease in the aortic arch. Right carotid system: Right carotid widely patent without significant stenosis. Left carotid system: Mild atherosclerotic disease left carotid bifurcation without significant stenosis. Vertebral arteries: Both vertebral arteries patent to the basilar without significant stenosis. Skeleton: Cervical spondylosis.  No acute skeletal abnormality. Other neck: Negative for soft tissue mass or edema in the neck. Upper chest: Lung apices clear bilaterally. Review of the MIP images confirms the above findings CTA HEAD FINDINGS Anterior circulation: Mild atherosclerotic disease in the cavernous carotid bilaterally without significant stenosis. Occlusion of the left middle cerebral artery at the bifurcation. There is a small early branch on the left MCA which extends to the parietal lobe. There is clot within this vessel. However there is occlusion of the majority of the superior and inferior divisions of the left MCA . Minimal collateral flow on delayed imaging. Both anterior cerebral arteries widely patent without stenosis. Right A1 segment is hypoplastic. Right  middle cerebral artery widely patent without stenosis. Posterior circulation: Both vertebral arteries patent to the basilar. PICA patent bilaterally. Basilar widely patent. AICA, superior cerebellar, and posterior cerebral arteries patent bilaterally without stenosis. Fetal origin left posterior cerebral  artery. Venous sinuses: Normal venous enhancement. Anatomic variants: None Review of the MIP images confirms the above findings IMPRESSION: 1. Acute occlusion left MCA bifurcation with little flow in the superior and inferior divisions left MCA. There is an early branch supplying a part of the left parietal lobe. Minimal collateral flow. 2. No other intracranial stenosis or occlusion. 3. No significant carotid or vertebral artery stenosis in the neck. 4. These results were called by telephone at the time of interpretation on 01/27/2020 at 2:41 pm to provider Rory Percy , who verbally acknowledged these results. Electronically Signed   By: Franchot Gallo M.D.   On: 01/27/2020 14:41    Labs:  CBC: Recent Labs    03/24/19 0938 03/24/19 0938 12/17/19 0831 01/27/20 1341 01/27/20 1346 01/27/20 1729 01/27/20 2123 01/28/20 0043 01/29/20 0545  WBC 6.1  --  6.0 9.9  --   --   --   --  8.4  HGB 14.4   < > 15.3 14.3   < > 14.6 12.9* 12.6* 11.7*  HCT 43.6   < > 45.5 43.4   < > 43.0 38.0* 37.0* 35.9*  PLT 211  --  220 242  --   --   --   --  144*   < > = values in this interval not displayed.    COAGS: Recent Labs    01/27/20 1341  INR 1.0  APTT 27    BMP: Recent Labs    03/24/19 0938 03/24/19 0938 12/17/19 0836 12/17/19 0836 01/27/20 1341 01/27/20 1341 01/27/20 1346 01/27/20 1346 01/27/20 1729 01/27/20 2123 01/28/20 0043 01/29/20 0545  NA 139   < > 139  --  140   < > 141   < > 139 139 141 139  K 5.2   < > 5.3*   < > 3.6   < > 3.5   < > 4.1 4.1 3.9 3.5  CL 101   < > 101  --  107  --  103  --   --   --   --  109  CO2 25  --  21  --  23  --   --   --   --   --   --  23  GLUCOSE 105*   <  > 109*  --  124*  --  121*  --   --   --   --  125*  BUN 20   < > 21  --  18  --  22  --   --   --   --  16  CALCIUM 9.6  --  9.3  --  9.2  --   --   --   --   --   --  8.0*  CREATININE 1.12   < > 1.01  --  1.09  --  0.90  --   --   --   --  0.97  GFRNONAA 69  --  78  --  >60  --   --   --   --   --   --  >60  GFRAA 80  --  90  --  >60  --   --   --   --   --   --  >60   < > = values in this interval not displayed.    LIVER FUNCTION TESTS: Recent Labs    03/24/19 0938 12/17/19 0836 01/27/20 1341  BILITOT 0.7 0.7 1.0  AST 14  24 23  ALT 25 51* 37  ALKPHOS 43 57 45  PROT 6.3 6.4 6.2*  ALBUMIN 4.2 4.4 4.1    Assessment and Plan: Distal left M1/MCA occlusion, s/p one pass direct contact aspiration with complete recanalization of the MCA Distal left A3/ACA occlusion s/p two pass direct contact aspiration in the ACA with no recanalization; one pass stent retriever with complete recanalization. Patient extubated this AM.  Neuro exam not likely at newly established baseline due to prolonged sedation.  No movement noted on right side.  Expressive aphasia.  Procedure site intact.  Groin soft, no evidence of hematoma.  Distal pulses intact.  Significant other at bedside with several questions which are answered extensively by Dr. Karenann Cai. NIR to follow.   Electronically Signed: Docia Barrier, PA 01/29/2020, 3:33 PM   I spent a total of 25 Minutes at the the patient's bedside AND on the patient's hospital floor or unit, greater than 50% of which was counseling/coordinating care for L MCA occlusion, L ACA occlusion.

## 2020-01-29 NOTE — Progress Notes (Signed)
NAME:  Dustin Schneider, MRN:  [id number], DOB:  [birth year], LOS: 2 ADMISSION DATE:  01/27/2020, CONSULTATION DATE:  01/27/2020 REFERRING MD:  Dr. Rory Percy, CHIEF COMPLAINT:  Vent management    Brief History    presented to ED 01/27/2020  with acute onset of right sided weakness, confusion, and aphasia. LKW: 1300. EMS was called and found the patient with left gaze deviation.   Initial VS: BP: 149/93.  NIHSS: 26. Emergent imaging revealed hyperdense left MCA sign.  He had episodes of emesis while imaging being obtained, ultimately intubated for IR. IR for thrombectomy, complete recanalization   Patient with distal left M1 occlusion and distal left ACA occlusion.  Reported complete recanalization of both vessels. . Not a candidate for TPA due to chronic anticoagulation with Xarelto.    Past Medical History  HTN NICM pAfib  Significant Hospital Events   6/16: Admit 6/16: IR for thrombectomy, complete recanalization   Consults:  6/16 Neurology 6/16 PCCM  Procedures:  6/16: IR for thrombectomy of L M1 occlusion and distal L ACA occlusion  Significant Diagnostic Tests:  6/16 CTH > hyperdense left MCA sign. 6/17 MRI brain > pending   Micro Data:  COVID Negative 6/16  Antimicrobials:    Interim history/subjective:    6/18 - meets SBT criteria. Not on sedation . Not on cleviprex. Last prn sedation was versed 2mg  @ 12h ago  Objective   Blood pressure (!) 145/86, pulse 72, temperature 99 F (37.2 C), temperature source Oral, resp. rate 13, height 5\' 9"  (1.753 m), weight 84 kg, SpO2 99 %.    Vent Mode: PRVC FiO2 (%):  [40 %] 40 % Set Rate:  [13 bmp] 13 bmp Vt Set:  [560 mL] 560 mL PEEP:  [5 cmH20] 5 cmH20 Plateau Pressure:  [15 cmH20-16 cmH20] 15 cmH20   Intake/Output Summary (Last 24 hours) at 01/29/2020 0808 Last data filed at 01/29/2020 0800 Gross per 24 hour  Intake 1979.67 ml  Output 625 ml  Net 1354.67 ml   Filed Weights   01/27/20 1650  Weight: 84 kg     Examination: Intubated male.  Alert.  Follows commands on the left side with good movement but right side is flaccid with dense hemiplegia.  Clear to auscultation bilaterally abdomen soft normal heart sounds.  Resolved Hospital Problem list     Assessment & Plan:  Acute ischemic stroke - Due to left acute occlusion of left middle cerebral artery   01/29/2020: Dense right-sided hemiplegia continues  P: Management per neurology  Maintain neuro protective measures;  euothermia, euglycemia, eunatermia, normoxia Nutrition and bowel regiment  Blood pressure per IR, goal 120-140 PT/OT/SLP evals once appropriate   Acute respiratory insufficiency  - -Intubated for revascularization procedure per IR  Concern for aspiration - -Seen with vomiting episode x2 during CT scan, decision made to leave intubated post procedure   01/29/2020 = meet spontaneous breathing trial criteria.  Yesterday did fail SBT  P: If patients does well with SBT and mentation allows will extubate  For now continue ventilator support with lung protective strategies  Wean PEEP and FiO2 for sats greater than 90%. Head of bed elevated 30 degrees. Plateau pressures less than 30 cm H20.  Follow intermittent chest x-ray and ABG.   Ensure adequate pulmonary hygiene  VAP bundle in place  PAD protocol  Concern for aspiration at admission  01/29/2020: No evidence of fever.  Normal white count  Plan -Monitor without antibiotics  Paroxysmal A-fib on chronic anticoagulation  - -  Home medications include Tikosyn, metoprolol, and Xarelto. Direct current cardioversion 05/13/2012 with improvement in EF seen  HX of hypertension Hx of NICM - -Mild LV systolic dysfunction seen on ECHO 04/2018, normalized after since remaining in NSR  01/29/2020 -off Cleviprex.  Blood pressure within goal between 532 and 992 systolic except very occasionally.  Echo January 28, 2020: Normal ejection fraction with mildly elevated pulmonary artery  pressure  P: Resume home Tikosyn and Metoprolol when appropriate Defer resumption of anticoagulation to neuro  Continuous telemetry  Check trop 01/30/20    Hyperlipidemia P: Continue home statin    Anemia critical illness  01/29/2020 -0 no overt bleed  Plan  - - PRBC for hgb </= 6.9gm%    - exceptions are   -  if ACS susepcted/confirmed then transfuse for hgb </= 8.0gm%,  or    -  active bleeding with hemodynamic instability, then transfuse regardless of hemoglobin value   At at all times try to transfuse 1 unit prbc as possible with exception of active hemorrhage  Thrombocytopenia  01/29/2020 - new < 150K and mild  Plan  - monitor; hoome xarelto when poossible  Electrolyte imbalance  01/29/2020 - low k and low mag  Plan - replete both  Best practice:  Diet: NPO Pain/Anxiety/Delirium protocol (if indicated): PRNs VAP protocol (if indicated): N/A DVT prophylaxis: Resume home anticoagulation when appropriate  GI prophylaxis: PPI Glucose control: SSI Mobility: Bedrest Code Status: Full Family Communication: son updated by Dr Chase Caller at bedside 01/28/20. Currently none at bedside Disposition: ICU      ATTESTATION & SIGNATURE   The patient Dustin Schneider is critically ill with multiple organ systems failure and requires high complexity decision making for assessment and support, frequent evaluation and titration of therapies, application of advanced monitoring technologies and extensive interpretation of multiple databases.   Critical Care Time devoted to patient care services described in this note is  31  Minutes. This time reflects time of care of this signee Dr Brand Males. This critical care time does not reflect procedure time, or teaching time or supervisory time of PA/NP/Med student/Med Resident etc but could involve care discussion time     Dr. Brand Males, M.D., [facility].C.P Pulmonary and Critical Care Medicine Staff Physician [facility] Pager: [phone], If no answer or between  15:00h - 7:00h: call [phone]  01/29/2020 8:09 AM      LABS    PULMONARY Recent Labs  Lab 01/27/20 1346 01/27/20 1729 01/27/20 2123 01/28/20 0043  PHART  --  7.292* 7.512* 7.445  PCO2ART  --  53.2* 27.0* 32.7  PO2ART  --  402* 147* 161*  HCO3  --  25.7 21.7 22.4  TCO2 24 27 23 23   O2SAT  --  100.0 100.0 100.0    CBC Recent Labs  Lab 01/27/20 1341 01/27/20 1346 01/27/20 2123 01/28/20 0043 01/29/20 0545  HGB 14.3   < > 12.9* 12.6* 11.7*  HCT 43.4   < > 38.0* 37.0* 35.9*  WBC 9.9  --   --   --  8.4  PLT 242  --   --   --  144*   < > = values in this interval not displayed.    COAGULATION Recent Labs  Lab 01/27/20 1341  INR 1.0    CARDIAC  No results for input(s): TROPONINI in the last 168 hours. No results for input(s): PROBNP in the last 168 hours.  CHEMISTRY Recent Labs  Lab 01/27/20 1341 01/27/20 1341 01/27/20 1346 01/27/20 1346 01/27/20 1729 01/27/20 1729 01/27/20 2123 01/27/20 2123 01/28/20 0043 01/29/20 0545  NA 140   < > 141  --  139  --  139  --  141 139  K 3.6   < > 3.5   < > 4.1   < > 4.1   < > 3.9 3.5  CL 107  --  103  --   --   --   --   --   --  109  CO2 23  --   --   --   --   --   --   --   --  23  GLUCOSE 124*  --  121*  --   --   --   --   --   --  125*  BUN 18  --  22  --   --   --   --   --   --  16  CREATININE 1.09  --  0.90  --   --   --   --   --   --  0.97  CALCIUM 9.2  --   --   --   --   --   --   --   --  8.0*  MG  --   --   --   --   --   --   --   --   --  1.9  PHOS  --   --   --   --   --   --   --   --   --  2.8   < > = values in this interval not displayed.   Estimated Creatinine Clearance: 75.9 mL/min (by C-G formula based on SCr of 0.97 mg/dL).   LIVER Recent Labs  Lab 01/27/20 1341  AST 23  ALT 37  ALKPHOS 45  BILITOT 1.0  PROT 6.2*  ALBUMIN 4.1  INR 1.0     INFECTIOUS No results for input(s):  LATICACIDVEN, PROCALCITON in the last 168 hours.   ENDOCRINE CBG (last 3)  Recent Labs    01/27/20 1344  GLUCAP 125*         IMAGING x48h  - image(s) personally visualized  -   highlighted in bold MR ANGIO HEAD WO CONTRAST  Result Date: 01/28/2020 CLINICAL DATA:  64 year old male code stroke presentation with left MCA occlusion, left ACA A3 occlusion. Endovascular reperfusion yesterday. EXAM: MRI HEAD WITHOUT CONTRAST MRA HEAD WITHOUT CONTRAST TECHNIQUE: Multiplanar, multiecho pulse sequences of the brain and surrounding structures were obtained without intravenous contrast. Angiographic images of the head were obtained using MRA technique without contrast. COMPARISON:  CTA head and neck yesterday. FINDINGS: MRI HEAD FINDINGS Brain: Dense restricted diffusion in the left caudate and ventral putamen, with associated cytotoxic edema and caudate swelling. Petechial hemorrhage (Heidelberg Classification 1A, series 8, image 48). There are also a few punctate foci of restricted diffusion in the nearby left inferior frontal gyrus and left insula, such as on series 2, image 28. Superimposed gyriform restricted diffusion along the left cingulate, and also involving posterior left superior frontal gyrus and parietal lobe (series 2, image 44 and series 4, image 17. T2 and FLAIR hyperintensity with gyral edema. No hemorrhage. One or 2 punctate areas of contralateral right caudate restricted diffusion, most notably series 2, image 38. T2 and FLAIR hyperintensity with no hemorrhage or mass effect. No other restricted  diffusion. Chronic appearing hemosiderin in right inferior frontal gyrus has associated dark T2 rim and punctate internal T2 hyperintensity compatible with a small cavernous venous vascular malformation. No developmental venous anomaly associated on CTA. No regional edema or mass effect. Trace subarachnoid hemorrhage would be difficult to exclude such as in the left occipital lobe sulci on series  7, image 14. Also questionable trace blood in the left temporal and occipital horns, versus subependymal chronic microhemorrhages there. No ventriculomegaly. No midline shift. Basilar cisterns remain normal. Cervicomedullary junction and pituitary are within normal limits. Outside of the acute findings there are a few scattered areas of subcortical white matter T2 and FLAIR hyperintensity. The right basal ganglia, thalami, brainstem and cerebellum appear negative. Vascular: Major intracranial vascular flow voids are preserved. Skull and upper cervical spine: Negative visible cervical spine, bone marrow signal. Sinuses/Orbits: Negative orbits. Mild to moderate paranasal sinus mucosal thickening. Other: Intubated.  Fluid in the pharynx.  Mastoids remain clear. MRA HEAD FINDINGS Antegrade flow in the posterior circulation with mildly dominant left V4 segment and no distal vertebral stenosis. Patent PICA origins, vertebrobasilar junction, basilar artery, AICA origins, SCA and PCA origins. Tortuous left P1 and fetal type left PCA on that side. Small right posterior communicating artery. Bilateral PCA branches are within normal limits. Antegrade flow in both ICA siphons, the left appears dominant with dominant left ACA. No siphon stenosis. Patent carotid termini. Normal ophthalmic and posterior communicating artery origins. Patent MCA and ACA origins. Anterior communicating artery and visible ACA branches are within normal limits. Restored patency on the left. Left MCA M1 segment and left MCA trifurcation are patent without stenosis. Left MCA branches appear within normal limits. Right MCA bifurcates early without stenosis. Visible right MCA branches are within normal limits. IMPRESSION: 1. Negative intracranial MRA, patent left MCA and ACA. 2. Confluent left basal ganglia infarct with edema and mild swelling. Left cingulate and additional left superior frontal / superior perirolandic gyral restricted diffusion with mild  cortical edema. A few punctate foci of restricted diffusion elsewhere including at the right cingulate. 3. Heidelberg Classification 1A petechial hemorrhage at the caudate. Difficult to exclude trace left hemisphere SAH and IVH, repeat plain Head CT could evaluate further. No significant intracranial mass effect. 4. 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 Electronically Signed   By: Genevie Ann M.D.   On: 01/28/2020 09:45   MR BRAIN WO CONTRAST  Result Date: 01/28/2020 CLINICAL DATA:  64 year old male code stroke presentation with left MCA occlusion, left ACA A3 occlusion. Endovascular reperfusion yesterday. EXAM: MRI HEAD WITHOUT CONTRAST MRA HEAD WITHOUT CONTRAST TECHNIQUE: Multiplanar, multiecho pulse sequences of the brain and surrounding structures were obtained without intravenous contrast. Angiographic images of the head were obtained using MRA technique without contrast. COMPARISON:  CTA head and neck yesterday. FINDINGS: MRI HEAD FINDINGS Brain: Dense restricted diffusion in the left caudate and ventral putamen, with associated cytotoxic edema and caudate swelling. Petechial hemorrhage (Heidelberg Classification 1A, series 8, image 48). There are also a few punctate foci of restricted diffusion in the nearby left inferior frontal gyrus and left insula, such as on series 2, image 28. Superimposed gyriform restricted diffusion along the left cingulate, and also involving posterior left superior frontal gyrus and parietal lobe (series 2, image 44 and series 4, image 17. T2 and FLAIR hyperintensity with gyral edema. No hemorrhage. One or 2 punctate areas of contralateral right caudate restricted diffusion, most notably series 2, image 38. T2 and FLAIR hyperintensity with no hemorrhage or  mass effect. No other restricted diffusion. Chronic appearing hemosiderin in right inferior frontal gyrus has associated dark T2 rim and punctate internal T2 hyperintensity compatible with a  small cavernous venous vascular malformation. No developmental venous anomaly associated on CTA. No regional edema or mass effect. Trace subarachnoid hemorrhage would be difficult to exclude such as in the left occipital lobe sulci on series 7, image 14. Also questionable trace blood in the left temporal and occipital horns, versus subependymal chronic microhemorrhages there. No ventriculomegaly. No midline shift. Basilar cisterns remain normal. Cervicomedullary junction and pituitary are within normal limits. Outside of the acute findings there are a few scattered areas of subcortical white matter T2 and FLAIR hyperintensity. The right basal ganglia, thalami, brainstem and cerebellum appear negative. Vascular: Major intracranial vascular flow voids are preserved. Skull and upper cervical spine: Negative visible cervical spine, bone marrow signal. Sinuses/Orbits: Negative orbits. Mild to moderate paranasal sinus mucosal thickening. Other: Intubated.  Fluid in the pharynx.  Mastoids remain clear. MRA HEAD FINDINGS Antegrade flow in the posterior circulation with mildly dominant left V4 segment and no distal vertebral stenosis. Patent PICA origins, vertebrobasilar junction, basilar artery, AICA origins, SCA and PCA origins. Tortuous left P1 and fetal type left PCA on that side. Small right posterior communicating artery. Bilateral PCA branches are within normal limits. Antegrade flow in both ICA siphons, the left appears dominant with dominant left ACA. No siphon stenosis. Patent carotid termini. Normal ophthalmic and posterior communicating artery origins. Patent MCA and ACA origins. Anterior communicating artery and visible ACA branches are within normal limits. Restored patency on the left. Left MCA M1 segment and left MCA trifurcation are patent without stenosis. Left MCA branches appear within normal limits. Right MCA bifurcates early without stenosis. Visible right MCA branches are within normal limits.  IMPRESSION: 1. Negative intracranial MRA, patent left MCA and ACA. 2. Confluent left basal ganglia infarct with edema and mild swelling. Left cingulate and additional left superior frontal / superior perirolandic gyral restricted diffusion with mild cortical edema. A few punctate foci of restricted diffusion elsewhere including at the right cingulate. 3. Heidelberg Classification 1A petechial hemorrhage at the caudate. Difficult to exclude trace left hemisphere SAH and IVH, repeat plain Head CT could evaluate further. No significant intracranial mass effect. 4. 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 Electronically Signed   By: Genevie Ann M.D.   On: 01/28/2020 09:45   DG CHEST PORT 1 VIEW  Result Date: 01/27/2020 CLINICAL DATA:  Stroke, intubated EXAM: PORTABLE CHEST 1 VIEW COMPARISON:  None. FINDINGS: Single frontal view of the chest demonstrates unremarkable cardiac silhouette. Ectasia of the thoracic aorta. Endotracheal tube overlies tracheal air column tip well above carina. Enteric catheter passes below diaphragm tip and side port project over gastric fundus. No airspace disease, effusion, or pneumothorax. No acute bony abnormalities. IMPRESSION: 1. Support devices as above. 2. No acute intrathoracic process. Electronically Signed   By: Randa Ngo M.D.   On: 01/27/2020 19:08   ECHOCARDIOGRAM COMPLETE  Result Date: 01/28/2020    ECHOCARDIOGRAM REPORT   Patient Name:   Dustin Schneider Date of Exam: 01/28/2020 Medical Rec #:  [id number]        Height:       69.0 in Accession #:    [id number]       Weight:       185.2 lb Date of Birth:  October 12, 1955        BSA:  2.000 m Patient Age:    45 years         BP:           133/81 mmHg Patient Gender: M                HR:           52 bpm. Exam Location:  Inpatient Procedure: 2D Echo, 3D Echo, Color Doppler and Cardiac Doppler Indications:    Stroke i163.9  History:        Patient has prior history of Echocardiogram  examinations, most                 recent 09/16/2018. Arrythmias:Atrial Fibrillation; Risk                 Factors:Hypertension.  Sonographer:    Raquel Sarna Senior RDCS Referring Phys: [id number] ASHISH ARORA  Sonographer Comments: Echo performed with patient supine and on artificial respirator. IMPRESSIONS  1. Left ventricular ejection fraction, by estimation, is 55 to 60%. The left ventricle has normal function. The left ventricle has no regional wall motion abnormalities. Left ventricular diastolic parameters are consistent with Grade II diastolic dysfunction (pseudonormalization).  2. Right ventricular systolic function is normal. The right ventricular size is normal. There is mildly elevated pulmonary artery systolic pressure.  3. Left atrial size was moderately dilated.  4. Right atrial size was moderately dilated.  5. The mitral valve is normal in structure. Mild mitral valve regurgitation.  6. The aortic valve is normal in structure. Aortic valve regurgitation is mild to moderate.  7. The inferior vena cava is dilated in size with >50% respiratory variability, suggesting right atrial pressure of 8 mmHg. FINDINGS  Left Ventricle: Left ventricular ejection fraction, by estimation, is 55 to 60%. The left ventricle has normal function. The left ventricle has no regional wall motion abnormalities. The left ventricular internal cavity size was normal in size. There is  no left ventricular hypertrophy. Left ventricular diastolic parameters are consistent with Grade II diastolic dysfunction (pseudonormalization). Right Ventricle: The right ventricular size is normal. No increase in right ventricular wall thickness. Right ventricular systolic function is normal. There is mildly elevated pulmonary artery systolic pressure. The tricuspid regurgitant velocity is 2.35  m/s, and with an assumed right atrial pressure of 15 mmHg, the estimated right ventricular systolic pressure is 85.0 mmHg. Left Atrium: Left atrial size was  moderately dilated. Right Atrium: Right atrial size was moderately dilated. Pericardium: There is no evidence of pericardial effusion. Mitral Valve: The mitral valve is normal in structure. There is mild prolapse of of the mitral valve. Mild mitral valve regurgitation. Tricuspid Valve: The tricuspid valve is normal in structure. Tricuspid valve regurgitation is trivial. Aortic Valve: The aortic valve is normal in structure. Aortic valve regurgitation is mild to moderate. Pulmonic Valve: The pulmonic valve was normal in structure. Pulmonic valve regurgitation is trivial. Aorta: The aortic root and ascending aorta are structurally normal, with no evidence of dilitation. Venous: The inferior vena cava is dilated in size with greater than 50% respiratory variability, suggesting right atrial pressure of 8 mmHg. IAS/Shunts: No atrial level shunt detected by color flow Doppler.  LEFT VENTRICLE PLAX 2D LVIDd:         5.40 cm  Diastology LVIDs:         3.30 cm  LV e' lateral:   12.00 cm/s LV PW:         0.70 cm  LV E/e' lateral: 8.3 LV IVS:  0.70 cm  LV e' medial:    10.20 cm/s LVOT diam:     2.20 cm  LV E/e' medial:  9.8 LV SV:         82 LV SV Index:   41 LVOT Area:     3.80 cm  RIGHT VENTRICLE RV S prime:     9.36 cm/s TAPSE (M-mode): 2.4 cm LEFT ATRIUM             Index       RIGHT ATRIUM           Index LA diam:        2.90 cm 1.45 cm/m  RA Area:     25.70 cm LA Vol (A2C):   79.6 ml 39.81 ml/m RA Volume:   71.90 ml  35.96 ml/m LA Vol (A4C):   49.8 ml 24.91 ml/m LA Biplane Vol: 67.8 ml 33.91 ml/m  AORTIC VALVE LVOT Vmax:   102.00 cm/s LVOT Vmean:  61.600 cm/s LVOT VTI:    0.216 m  AORTA Ao Root diam: 3.60 cm Ao Asc diam:  3.10 cm MITRAL VALVE               TRICUSPID VALVE MV Area (PHT): 4.31 cm    TR Peak grad:   22.1 mmHg MV Decel Time: 176 msec    TR Vmax:        235.00 cm/s MV E velocity: 99.80 cm/s MV A velocity: 32.60 cm/s  SHUNTS MV E/A ratio:  3.06        Systemic VTI:  0.22 m                             Systemic Diam: 2.20 cm Glori Bickers MD Electronically signed by Glori Bickers MD Signature Date/Time: 01/28/2020/2:18:14 PM    Final    CT HEAD CODE STROKE WO CONTRAST  Result Date: 01/27/2020 CLINICAL DATA:  Code stroke.  Stroke.  Last seen normal 1 hour ago. EXAM: CT HEAD WITHOUT CONTRAST TECHNIQUE: Contiguous axial images were obtained from the base of the skull through the vertex without intravenous contrast. COMPARISON:  None. FINDINGS: Brain: No evidence of acute infarction, hemorrhage, hydrocephalus, extra-axial collection or mass lesion/mass effect. Vascular: Hyperdense left M2 segment in the sylvian fissure compatible with acute thrombus. No other hyperdense vessel. Skull: Negative Sinuses/Orbits: Retention cyst left maxillary sinus. Negative orbit. Other: None ASPECTS (Eek Stroke Program Early CT Score) - Ganglionic level infarction (caudate, lentiform nuclei, internal capsule, insula, M1-M3 cortex): 7 - Supraganglionic infarction (M4-M6 cortex): 3 Total score (0-10 with 10 being normal): 10 IMPRESSION: 1. Hyperdense left M2 branch compatible with acute thrombus. No acute infarct or hemorrhage 2. ASPECTS is 10 3. These results were called by telephone at the time of interpretation on 01/27/2020 at 1:56 pm to provider Rory Percy , who verbally acknowledged these results. Electronically Signed   By: Franchot Gallo M.D.   On: 01/27/2020 13:57   CT ANGIO HEAD CODE STROKE  Result Date: 01/27/2020 CLINICAL DATA:  Stroke.  Aphasia. EXAM: CT ANGIOGRAPHY HEAD AND NECK TECHNIQUE: Multidetector CT imaging of the head and neck was performed using the standard protocol during bolus administration of intravenous contrast. Multiplanar CT image reconstructions and MIPs were obtained to evaluate the vascular anatomy. Carotid stenosis measurements (when applicable) are obtained utilizing NASCET criteria, using the distal internal carotid diameter as the denominator. CONTRAST:  54mL OMNIPAQUE IOHEXOL 350 MG/ML  SOLN COMPARISON:  CT head 01/27/2020  FINDINGS: CTA NECK FINDINGS Aortic arch: 4 vessel arch. Left vertebral artery origin from the arch. Proximal great vessels widely patent. Minimal atherosclerotic disease in the aortic arch. Right carotid system: Right carotid widely patent without significant stenosis. Left carotid system: Mild atherosclerotic disease left carotid bifurcation without significant stenosis. Vertebral arteries: Both vertebral arteries patent to the basilar without significant stenosis. Skeleton: Cervical spondylosis.  No acute skeletal abnormality. Other neck: Negative for soft tissue mass or edema in the neck. Upper chest: Lung apices clear bilaterally. Review of the MIP images confirms the above findings CTA HEAD FINDINGS Anterior circulation: Mild atherosclerotic disease in the cavernous carotid bilaterally without significant stenosis. Occlusion of the left middle cerebral artery at the bifurcation. There is a small early branch on the left MCA which extends to the parietal lobe. There is clot within this vessel. However there is occlusion of the majority of the superior and inferior divisions of the left MCA . Minimal collateral flow on delayed imaging. Both anterior cerebral arteries widely patent without stenosis. Right A1 segment is hypoplastic. Right middle cerebral artery widely patent without stenosis. Posterior circulation: Both vertebral arteries patent to the basilar. PICA patent bilaterally. Basilar widely patent. AICA, superior cerebellar, and posterior cerebral arteries patent bilaterally without stenosis. Fetal origin left posterior cerebral artery. Venous sinuses: Normal venous enhancement. Anatomic variants: None Review of the MIP images confirms the above findings IMPRESSION: 1. Acute occlusion left MCA bifurcation with little flow in the superior and inferior divisions left MCA. There is an early branch supplying a part of the left parietal lobe. Minimal collateral flow. 2. No  other intracranial stenosis or occlusion. 3. No significant carotid or vertebral artery stenosis in the neck. 4. These results were called by telephone at the time of interpretation on 01/27/2020 at 2:41 pm to provider Rory Percy , who verbally acknowledged these results. Electronically Signed   By: Franchot Gallo M.D.   On: 01/27/2020 14:41   CT ANGIO NECK CODE STROKE  Result Date: 01/27/2020 CLINICAL DATA:  Stroke.  Aphasia. EXAM: CT ANGIOGRAPHY HEAD AND NECK TECHNIQUE: Multidetector CT imaging of the head and neck was performed using the standard protocol during bolus administration of intravenous contrast. Multiplanar CT image reconstructions and MIPs were obtained to evaluate the vascular anatomy. Carotid stenosis measurements (when applicable) are obtained utilizing NASCET criteria, using the distal internal carotid diameter as the denominator. CONTRAST:  69mL OMNIPAQUE IOHEXOL 350 MG/ML SOLN COMPARISON:  CT head 01/27/2020 FINDINGS: CTA NECK FINDINGS Aortic arch: 4 vessel arch. Left vertebral artery origin from the arch. Proximal great vessels widely patent. Minimal atherosclerotic disease in the aortic arch. Right carotid system: Right carotid widely patent without significant stenosis. Left carotid system: Mild atherosclerotic disease left carotid bifurcation without significant stenosis. Vertebral arteries: Both vertebral arteries patent to the basilar without significant stenosis. Skeleton: Cervical spondylosis.  No acute skeletal abnormality. Other neck: Negative for soft tissue mass or edema in the neck. Upper chest: Lung apices clear bilaterally. Review of the MIP images confirms the above findings CTA HEAD FINDINGS Anterior circulation: Mild atherosclerotic disease in the cavernous carotid bilaterally without significant stenosis. Occlusion of the left middle cerebral artery at the bifurcation. There is a small early branch on the left MCA which extends to the parietal lobe. There is clot within this  vessel. However there is occlusion of the majority of the superior and inferior divisions of the left MCA . Minimal collateral flow on delayed imaging. Both anterior cerebral arteries widely patent without stenosis. Right  A1 segment is hypoplastic. Right middle cerebral artery widely patent without stenosis. Posterior circulation: Both vertebral arteries patent to the basilar. PICA patent bilaterally. Basilar widely patent. AICA, superior cerebellar, and posterior cerebral arteries patent bilaterally without stenosis. Fetal origin left posterior cerebral artery. Venous sinuses: Normal venous enhancement. Anatomic variants: None Review of the MIP images confirms the above findings IMPRESSION: 1. Acute occlusion left MCA bifurcation with little flow in the superior and inferior divisions left MCA. There is an early branch supplying a part of the left parietal lobe. Minimal collateral flow. 2. No other intracranial stenosis or occlusion. 3. No significant carotid or vertebral artery stenosis in the neck. 4. These results were called by telephone at the time of interpretation on 01/27/2020 at 2:41 pm to provider Rory Percy , who verbally acknowledged these results. Electronically Signed   By: Franchot Gallo M.D.   On: 01/27/2020 14:41

## 2020-01-29 NOTE — Procedures (Signed)
Extubation Procedure Note  Patient Details:   Name: Dustin Schneider DOB: Jan 03, 1956 MRN: [id number]   Airway Documentation:    Vent end date: 01/29/20 Vent end time: 0935   Evaluation  O2 sats: stable throughout Complications: No apparent complications Patient did tolerate procedure well. Bilateral Breath Sounds: Clear, Diminished   No   Patient extubated per order to 4L [name] with no apparent complications. Positive cuff leak was noted prior to extubation. Patient is alert but is unable to speak at this time. RN says this is due to stroke. Patient has a good cough. Vitals are stable. RT will continue to monitor.   Arlester Keehan Clyda Greener 01/29/2020, 9:39 AM

## 2020-01-29 NOTE — Procedures (Signed)
Cortrak  Person Inserting Tube:  Trevor Wilkie E, RD Tube Type:  Cortrak - 43 inches Tube Location:  Left nare Initial Placement:  Stomach Secured by: Bridle Technique Used to Measure Tube Placement:  Documented cm marking at nare/ corner of mouth Cortrak Secured At:  69 cm    Cortrak Tube Team Note:  Consult received to place a Cortrak feeding tube.   No x-ray is required. RN may begin using tube.    If the tube becomes dislodged please keep the tube and contact the Cortrak team at [email or web address] (password [email or web address]) for replacement.  If after hours and replacement cannot be delayed, place a NG tube and confirm placement with an abdominal x-ray.    Fahim Kats, MS, RD, LDN RD pager number and weekend/on-call pager number located in Amion.   

## 2020-01-30 ENCOUNTER — Inpatient Hospital Stay (HOSPITAL_COMMUNITY): Payer: BC Managed Care – PPO

## 2020-01-30 LAB — CBC
HCT: 37.5 % — ABNORMAL LOW (ref 39.0–52.0)
Hemoglobin: 12.3 g/dL — ABNORMAL LOW (ref 13.0–17.0)
MCH: 31.9 pg (ref 26.0–34.0)
MCHC: 32.8 g/dL (ref 30.0–36.0)
MCV: 97.2 fL (ref 80.0–100.0)
Platelets: 137 10*3/uL — ABNORMAL LOW (ref 150–400)
RBC: 3.86 MIL/uL — ABNORMAL LOW (ref 4.22–5.81)
RDW: 13.6 % (ref 11.5–15.5)
WBC: 8.9 10*3/uL (ref 4.0–10.5)
nRBC: 0 % (ref 0.0–0.2)

## 2020-01-30 LAB — BASIC METABOLIC PANEL
Anion gap: 7 (ref 5–15)
BUN: 15 mg/dL (ref 8–23)
CO2: 25 mmol/L (ref 22–32)
Calcium: 8.5 mg/dL — ABNORMAL LOW (ref 8.9–10.3)
Chloride: 108 mmol/L (ref 98–111)
Creatinine, Ser: 0.82 mg/dL (ref 0.61–1.24)
GFR calc Af Amer: >60 mL/min (ref >60)
GFR calc non Af Amer: >60 mL/min (ref >60)
Glucose, Bld: 138 mg/dL — ABNORMAL HIGH (ref 70–99)
Potassium: 4.1 mmol/L (ref 3.5–5.1)
Sodium: 140 mmol/L (ref 135–145)

## 2020-01-30 LAB — MAGNESIUM: Magnesium: 2.2 mg/dL (ref 1.7–2.4)

## 2020-01-30 LAB — PHOSPHORUS: Phosphorus: 1.9 mg/dL — ABNORMAL LOW (ref 2.5–4.6)

## 2020-01-30 MED ORDER — HYDRALAZINE HCL 20 MG/ML IJ SOLN
5.0000 mg | Freq: Four times a day (QID) | INTRAMUSCULAR | Status: DC | PRN
  Administered 2020-01-30 – 2020-01-31 (×3): 5 mg via INTRAVENOUS
  Filled 2020-01-30 (×3): qty 1

## 2020-01-30 MED ORDER — SODIUM PHOSPHATES 45 MMOLE/15ML IV SOLN
30.0000 mmol | Freq: Once | INTRAVENOUS | Status: AC
  Administered 2020-01-30: 30 mmol via INTRAVENOUS
  Filled 2020-01-30: qty 10

## 2020-01-30 MED ORDER — POLYETHYLENE GLYCOL 3350 17 G PO PACK
17.0000 g | PACK | Freq: Every day | ORAL | Status: DC
  Administered 2020-01-30 – 2020-01-31 (×2): 17 g
  Filled 2020-01-30 (×2): qty 1

## 2020-01-30 NOTE — Progress Notes (Signed)
Physical Therapy Treatment Patient Details Name: Dustin Schneider MRN: [id number] DOB: 10-14-1955 Today's Date: 01/30/2020    History of Present Illness 64 y.o. male with history of paroxysmal A. fib on chronic Xarelto, nonischemic cardiomyopathy and hypertension.  Patient was at [facility] today.  Patient was noted to come out of the bathroom and suddenly collapsed by bystanders. On arrival they noted that he had a left forced gaze and right hemiplegia.  Patient was also nauseous and vomiting. of head and neck was immediately obtained and showed a dense left MCA. Pt underwent emergent L MCA and ACA revascularization on 6/16.    PT Comments    Pt tolerated treatment well, demonstrating improved activity tolerance. Pt tolerates multiple attempts at standing and pre-gait weight shifting exercise. Pt continues to demonstrate pushing to R side and a strong R lateral lean but does respond to verbal cues to lean left, although with great difficulty. Pt continues to require significant physical assistance for all functional mobility to reduce falls risk and is noted to remain without AROM in R side at this time. Pt will continue to benefit from aggressive mobilization and PT POC to improve mobility quality and to reduce falls risk. PT continues to recommend CIR at this time as the pt tolerates session well and demonstrates the potential for significant functional gains.   Follow Up Recommendations  CIR     Equipment Recommendations  Wheelchair (measurements PT);Wheelchair cushion (measurements PT);Hospital bed    Recommendations for Other Services       Precautions / Restrictions Precautions Precautions: Fall Precaution Comments: pusher Restrictions Weight Bearing Restrictions: No    Mobility  Bed Mobility Overal bed mobility: Needs Assistance Bed Mobility: Supine to Sit     Supine to sit: Max assist;HOB elevated     General bed mobility comments: pt with improved mobility,  needing assistance for RLE, assist to turn hips although pt utilizing railing with LUE to help, and assistance to elevate trunk  Transfers Overall transfer level: Needs assistance Equipment used: 1 person hand held assist Transfers: Sit to/from Stand;Stand Pivot Transfers Sit to Stand: Max assist Stand pivot transfers: Max assist       General transfer comment: pt continues to push and lean toward R side during standing, PT works on weight shifting and pt is able to actively shift toward left side with PT verbal and tactile cues  Ambulation/Gait                 Stairs             Wheelchair Mobility    Modified Rankin (Stroke Patients Only) Modified Rankin (Stroke Patients Only) Pre-Morbid Rankin Score: No symptoms Modified Rankin: Severe disability     Balance Overall balance assessment: Needs assistance Sitting-balance support: Single extremity supported;Feet supported Sitting balance-Leahy Scale: Poor Sitting balance - Comments: modA to maintain static sitting balance, pushing to R side Postural control: Right lateral lean Standing balance support: Bilateral upper extremity supported Standing balance-Leahy Scale: Zero Standing balance comment: maxA to maintain standing due to strong R lateral lean                            Cognition Arousal/Alertness: Awake/alert Behavior During Therapy: Flat affect Overall Cognitive Status: Difficult to assess  General Comments: pt follows commands consistently this session with increased time, difficult to assess communication further due to communication deficits      Exercises      General Comments General comments (skin integrity, edema, etc.): pt noted to be hypertensive, 155/106 upon sitting edge of bed, 160/101 after transfer to chair, and 168/89 after multiple standing attempts. RN made aware.      Pertinent Vitals/Pain Pain Assessment:  Faces Faces Pain Scale: No hurt    Home Living                      Prior Function            PT Goals (current goals can now be found in the care plan section) Acute Rehab PT Goals Patient Stated Goal: to be able to sculpt again  Progress towards PT goals: Progressing toward goals    Frequency    Min 4X/week      PT Plan Current plan remains appropriate    Co-evaluation              AM-PAC PT "6 Clicks" Mobility   Outcome Measure  Help needed turning from your back to your side while in a flat bed without using bedrails?: A Lot Help needed moving from lying on your back to sitting on the side of a flat bed without using bedrails?: Total Help needed moving to and from a bed to a chair (including a wheelchair)?: Total Help needed standing up from a chair using your arms (e.g., wheelchair or bedside chair)?: Total Help needed to walk in hospital room?: Total Help needed climbing 3-5 steps with a railing? : Total 6 Click Score: 7    End of Session Equipment Utilized During Treatment: Oxygen Activity Tolerance: Patient tolerated treatment well Patient left: in chair;with chair alarm set;with call bell/phone within reach;with family/visitor present Nurse Communication: Mobility status;Need for lift equipment PT Visit Diagnosis: Other abnormalities of gait and mobility (R26.89);Muscle weakness (generalized) (M62.81);Other symptoms and signs involving the nervous system (R29.898)     Time: 2446-2863 PT Time Calculation (min) (ACUTE ONLY): 35 min  Charges:  $Therapeutic Activity: 23-37 mins                     Zenaida Niece, PT, DPT Acute Rehabilitation Pager: [phone]    Zenaida Niece 01/30/2020, 1:15 PM

## 2020-01-30 NOTE — Consult Note (Signed)
Physical Medicine and Rehabilitation Consult Reason for Consult: Impaired mobility and ADLs following acute ischemic stroke. Referring Physician: Rosalin Hawking, MD   HPI: Dustin Schneider is a 64 y.o. male with a history of paroxysmal atrial fibrillation on chronic Xarelto, nonischemic cardiomyopathy, and HTN. Patient was at [place] and was noted to collapse when he came out of the bathroom. He had a forced left gaze and right sided hemiplegia. He was also nauseous and vomiting. Imagine showed dense left MCA stroke. Patient underwent emergent L MCA and ACA revascularization on 6/16.   He has been tolerating therapy well and demonstrating improving activity tolerance.   ROS: Unable to obtain due to aphasia    Past Medical History:  Diagnosis Date  . Hypertension   . NICM (nonischemic cardiomyopathy) ([place]) 06/15/2018   04/23/18: EF 45%, 09/15/18: NOrmal LVEF  . Paroxysmal atrial fibrillation ([place]) 06/15/2018  . Visit for monitoring Tikosyn therapy 06/12/2018   Past Surgical History:  Procedure Laterality Date  . CARDIOVERSION N/A 05/13/2018   Procedure: CARDIOVERSION;  Surgeon: [name], MD;  Location: [facility] ENDOSCOPY;  Service: Cardiovascular;  Laterality: N/A;  . CARDIOVERSION N/A 06/13/2018   Procedure: CARDIOVERSION;  Surgeon: [name], MD;  Location: [facility] ENDOSCOPY;  Service: Cardiovascular;  Laterality: N/A;  . CARDIOVERSION N/A 06/23/2018   Procedure: CARDIOVERSION;  Surgeon: [name], MD;  Location: [place];  Service: Cardiovascular;  Laterality: N/A;  . excision of actinic keratosis    . EYE SURGERY     eye lift  . IR PERCUTANEOUS ART THROMBECTOMY/INFUSION INTRACRANIAL INC DIAG ANGIO  01/27/2020      . RADIOLOGY WITH ANESTHESIA N/A 01/27/2020   Procedure: IR WITH ANESTHESIA;  Surgeon: Radiologist, Medication, MD;  Location: [place];  Service: Radiology;  Laterality: N/A;  . TONSILLECTOMY    . WISDOM TOOTH EXTRACTION     Family History  Problem Relation Age  of Onset  . Glaucoma Mother   . Atrial fibrillation Father    Social History:  reports that he has never smoked. He has never used smokeless tobacco. He reports current alcohol use. He reports that he does not use drugs. Allergies: No Known Allergies Medications Prior to Admission  Medication Sig Dispense Refill  . dofetilide (TIKOSYN) 500 MCG capsule Take 1 capsule (500 mcg total) by mouth 2 (two) times daily. 180 capsule 3  . MAGNESIUM CITRATE PO Take 250 mg by mouth in the morning and at bedtime.     . metoprolol succinate (TOPROL-XL) 100 MG 24 hr tablet TAKE 1 TABLET BY MOUTH TWICE DAILY. (Patient taking differently: Take 100 mg by mouth 2 (two) times daily. ) 180 tablet 1  . Potassium 99 MG TABS Take 99 mg by mouth daily.    . rosuvastatin (CRESTOR) 10 MG tablet Take 1 tablet (10 mg total) by mouth daily. 90 tablet 3  . valsartan (DIOVAN) 320 MG tablet TAKE ONE TABLET AT BEDTIME. (Patient taking differently: Take 320 mg by mouth at bedtime. ) 90 tablet 1  . XARELTO 20 MG TABS tablet TAKE ONE TABLET IN THE EVENING AFTER DINNER. (Patient taking differently: Take 20 mg by mouth daily after supper. 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 [place] Functional Status:  Mobility: Bed Mobility Overal bed mobility: Needs Assistance Bed Mobility: Supine to Sit Supine to sit: Max assist, HOB elevated General bed mobility  comments: pt with improved mobility, needing assistance for RLE, assist to turn hips although pt utilizing railing with LUE to help, and assistance to elevate trunk Transfers Overall transfer level: Needs assistance Equipment used: 1 person hand held assist Transfers: Sit to/from Stand, Stand Pivot Transfers Sit to Stand: Max assist Stand pivot transfers: Max assist General transfer comment: pt continues to push and lean toward R side during standing, PT works on weight shifting and pt is able to actively shift toward left side with PT verbal and tactile cues      ADL: ADL Overall ADL's : Needs assistance/impaired Eating/Feeding: NPO Grooming: Wash/dry hands, Wash/dry face, Oral care, Brushing hair, Moderate assistance, Sitting Grooming Details (indicate cue type and reason): Pt using yaunker mod I while in bed  Upper Body Bathing: Maximal assistance, Sitting Lower Body Bathing: Maximal assistance, Sit to/from stand Upper Body Dressing : Total assistance, Sitting Lower Body Dressing: Total assistance, Sit to/from stand Toilet Transfer: Maximal assistance, +2 for safety/equipment, +2 for physical assistance, Stand-pivot, BSC Toileting- Clothing Manipulation and Hygiene: Total assistance, Sit to/from stand Functional mobility during ADLs: Maximal assistance, +2 for physical assistance, +2 for safety/equipment  Cognition: Cognition Overall Cognitive Status: Difficult to assess Arousal/Alertness: Awake/alert Orientation Level: Other (comment) Attention: Sustained Sustained Attention: Appears intact (during simple, functional tasks) Cognition Arousal/Alertness: Awake/alert Behavior During Therapy: Flat affect Overall Cognitive Status: Difficult to assess General Comments: pt follows commands consistently this session with increased time, difficult to assess communication further due to communication deficits Difficult to assess due to: Impaired communication  Blood pressure (!)  164/99, pulse 77, temperature (!) 100.8 F (38.2 C), temperature source Oral, resp. rate (!) 28, height 5\' 9"  (1.753 m), weight 84 kg, SpO2 100 %. Physical Exam   General: Alert, No apparent distress HEENT: Head is normocephalic, atraumatic, PERRLA, EOMI, sclera anicteric, oral mucosa pink and moist, dentition intact, ext ear canals clear,  Neck: Supple without JVD or lymphadenopathy Heart: Irregularly irregular heart rate and rhythm. No murmurs rubs or gallops Chest: CTA bilaterally without wheezes, rales, or rhonchi; no distress Abdomen: Soft, non-tender, non-distended, bowel sounds positive. Extremities: No clubbing, cyanosis, or edema. Pulses are 2+ Skin: Clean and intact without signs of breakdown Neuro: Opens eyes to verbal stimuli. Follows simple commands. Fine motor coordination is intact. No tremors.  LUE 4/5 LLE 4-/5 1/5 RUE Flaccid RLE Severe expressive >receptive aphasia Psych: [name] affect is appropriate. Pt is cooperative   Results for orders placed or performed during the hospital encounter of 01/27/20 (from the past 24 hour(s))  CBC     Status: Abnormal   Collection Time: 01/30/20  5:47 AM  Result Value Ref Range   WBC 8.9 4.0 - 10.5 K/uL   RBC 3.86 (L) 4.22 - 5.81 MIL/uL   Hemoglobin 12.3 (L) 13.0 - 17.0 g/dL   HCT 37.5 (L) 39 - 52 %   MCV 97.2 80.0 - 100.0 fL   MCH 31.9 26.0 - 34.0 pg   MCHC 32.8 30.0 - 36.0 g/dL   RDW 13.6 11.5 - 15.5 %   Platelets 137 (L) 150 - 400 K/uL   nRBC 0.0 0.0 - 0.2 %  Basic metabolic panel     Status: Abnormal   Collection Time: 01/30/20  5:47 AM  Result Value Ref Range   Sodium 140 135 - 145 mmol/L   Potassium 4.1 3.5 - 5.1 mmol/L   Chloride 108 98 - 111 mmol/L   CO2 25 22 - 32 mmol/L   Glucose, Bld 138 (H) 70 - 99 mg/dL   BUN 15 8 - 23 mg/dL   Creatinine, Ser 0.82 0.61 - 1.24 mg/dL   Calcium 8.5 (L) 8.9 - 10.3 mg/dL   GFR calc non Af Amer >60 >60 mL/min   GFR calc Af Amer >60 >60 mL/min   Anion gap 7 5 - 15  Phosphorus      Status: Abnormal   Collection Time: 01/30/20  5:47 AM  Result Value Ref Range   Phosphorus 1.9 (L) 2.5 - 4.6 mg/dL  Magnesium     Status: None   Collection Time: 01/30/20  5:47 AM  Result Value Ref Range   Magnesium 2.2 1.7 - 2.4 mg/dL   DG CHEST PORT 1 VIEW  Result Date: 01/30/2020 CLINICAL DATA:  Re-evaluate ETT. EXAM: PORTABLE CHEST 1 VIEW COMPARISON:  January 27, 2020 FINDINGS: The ET tube is no longer visualized. The feeding tube terminates below today's film. No pneumothorax. Vascular crowding in the medial right lung base. Mild haziness over the left chest may be due to patient rotation. No focal infiltrates are noted. IMPRESSION: 1. The purpose of the study is to evaluate for an ETT. No ET tube identified. 2. The feeding tube terminates below today's film. 3. No other acute abnormalities. These results will be called to the ordering clinician or representative by the Radiologist Assistant, and communication documented in the PACS or [employer]. Electronically Signed   By: Dorise Bullion III M.D   On: 01/30/2020 10:56     Assessment/Plan: Diagnosis: L MCA and ACA infarcts 1. Does the need for close, 24 hr/day medical supervision in concert with the patient's rehab needs make it unreasonable for this patient to be served in a less intensive setting? Yes 2. Co-Morbidities requiring supervision/potential complications: paroxysmal atrial fibrilation, dense right sided hemiplegia, L forced gaze, nonischemic cardiomyopathy, HTN 3. Due to bladder management, bowel management, safety, skin/wound care, disease management, medication administration, pain management and patient education, does the patient require 24 hr/day rehab nursing? Yes 4. Does the patient require coordinated care of a physician, rehab nurse, therapy disciplines of PT, OT, SLP to address physical and functional deficits in the context of the above medical diagnosis(es)? Yes Addressing deficits in the following areas:  balance, endurance, locomotion, strength, transferring, bowel/bladder control, bathing, dressing, feeding, grooming, toileting, cognition, speech, language, swallowing and psychosocial support 5. Can the patient actively participate in an intensive therapy program of at least 3 hrs of therapy per day at least 5 days per week? Yes 6. The potential for patient to make measurable gains while on inpatient rehab is excellent 7. Anticipated functional outcomes upon discharge from inpatient rehab are min assist  with PT, min assist with OT, supervision with SLP. 8. Estimated rehab length of stay to reach the above functional goals is: 2-3 weeks 9. Anticipated discharge destination: Home 10. Overall Rehab/Functional Prognosis: excellent  RECOMMENDATIONS: This patient's condition is appropriate for continued rehabilitative care in the following setting: CIR Patient has agreed to participate in recommended program. Potentially Note that insurance prior authorization may be required for reimbursement for recommended care.  Comment: Mr. Foglio would be an excellent CIR candidate if  family support can be established upon discharge. He currently lives alone but will likely need support upon discharge. Thank you for this consult. We will continue to follow in Mr. Schembri care.   Izora Ribas, MD 01/30/2020

## 2020-01-30 NOTE — Progress Notes (Signed)
NAME:  Dustin Schneider, MRN:  [id number], DOB:  Dec 27, 1955, LOS: 3 ADMISSION DATE:  01/27/2020, CONSULTATION DATE:  01/27/2020 REFERRING MD:  Dr. Rory Percy, CHIEF COMPLAINT:  Vent management    Brief History   64 yo male with PMH significant for Afib on Xarelto admitted with L MCA stroke s/p thrombectomy. Not a candidate for TPA due to chronic anticoagulation with Xarelto.   History of present illness   Patient presented to ED today with acute onset of right sided weakness, confusion, and aphasia. LKW: 1300. EMS was called and found the patient with left gaze deviation.   Initial VS: BP: 149/93.  NIHSS: 26. Emergent imaging revealed hyperdense left MCA sign.  He had episodes of emesis while imaging being obtained, ultimately intubated for IR.  Patient with distal left M1 occlusion and distal left ACA occlusion.  Reported complete recanalization of both vessels.   Past Medical History  HTN NICM pAfib  Significant Hospital Events   6/16: Admit 6/16: IR for thrombectomy, complete recanalization  6/18: Extubated.   Consults:  6/16 Neurology 6/16 PCCM  Procedures:  6/16: IR for thrombectomy of L M1 occlusion and distal L ACA occlusion  Significant Diagnostic Tests:  6/16 CTH > hyperdense left MCA sign. 6/17 MRI brain > pending   Micro Data:  COVID Negative 6/16  Antimicrobials:    Interim history/subjective:  No events overnight.   Objective   Blood pressure (!) 145/90, pulse 60, temperature 98.3 F (36.8 C), temperature source Oral, resp. rate 18, height 5\' 9"  (1.753 m), weight 84 kg, SpO2 94 %.    Vent Mode: PSV;CPAP FiO2 (%):  [40 %] 40 % PEEP:  [5 cmH20] 5 cmH20 Pressure Support:  [5 cmH20-10 cmH20] 5 cmH20   Intake/Output Summary (Last 24 hours) at 01/30/2020 0731 Last data filed at 01/30/2020 0600 Gross per 24 hour  Intake 2121.23 ml  Output 1000 ml  Net 1121.23 ml   Filed Weights   01/27/20 1650  Weight: 84 kg    Examination: General: Middle aged male lying  in bed in no acute distress HEENT: ETT, MM pink/moist, PERRL, sclera non-icteric  Neuro: Opens eyes to verbal stimuli, weak cough, follows simple commands to left upper and lower, 3/5 LUE/LLE, 1/5 RLE and 0/5 RUE CV: s1s2 regular rate and rhythm, no murmur, rubs, or gallops,  PULM:  Clear to ascultation bilaterally, no accessory muscle use  GI: soft, active bowel sounds, non-tender, distended  Extremities: warm/dry, no edema  Skin: no rashes or lesions  Resolved Hospital Problem list     Assessment & Plan:  Acute ischemic stroke -Due to left acute occlusion of left middle cerebral artery  P: Management per neurology  BP Goal <160  PT/OT/SLP following   Acute respiratory insufficiency in post-operative setting  Concern for aspiration  -Seen with vomiting episode x2 during CT scan, decision made to leave intubated post procedure  P: Extubated 6/18  Titrate Supplemental Oxygen for Saturation Goal >92   Paroxysmal A-fib on chronic anticoagulation  -Home medications include Tikosyn, metoprolol, and Xarelto -Direct current cardioversion 05/13/2012 with improvement in EF seen  HX of hypertension Hx of NICM -Mild LV systolic dysfunction seen on ECHO 04/2018, normalized after since remaining in NSR P: Resume home Tikosyn (Cardiology will re-start at follow-up post-discharge)  Continue Valsartan and Metoprolol  Defer resumption of anticoagulation to neuro >> Plan to start Eliquis 3-5 days post-CVA Continuous telemetry   Hyperlipidemia P: Continue home statin   Best practice:  Diet: ST Following >>  Currently on TF DVT prophylaxis: Resume home anticoagulation when appropriate  GI prophylaxis: PPI Glucose control: SSI Mobility: Bedrest Code Status: Full Family Communication: Per primary  Disposition: May Transfer out of ICU today >> Will discuss with neurology.   Labs   CBC: Recent Labs  Lab 01/27/20 1341 01/27/20 1346 01/27/20 1729 01/27/20 2123 01/28/20 0043  01/29/20 0545 01/30/20 0547  WBC 9.9  --   --   --   --  8.4 8.9  NEUTROABS 4.9  --   --   --   --   --   --   HGB 14.3   < > 14.6 12.9* 12.6* 11.7* 12.3*  HCT 43.4   < > 43.0 38.0* 37.0* 35.9* 37.5*  MCV 96.7  --   --   --   --  97.8 97.2  PLT 242  --   --   --   --  144* 137*   < > = values in this interval not displayed.    Basic Metabolic Panel: Recent Labs  Lab 01/27/20 1341 01/27/20 1341 01/27/20 1346 01/27/20 1729 01/27/20 2123 01/28/20 0043 01/29/20 0545  NA 140   < > 141 139 139 141 139  K 3.6   < > 3.5 4.1 4.1 3.9 3.5  CL 107  --  103  --   --   --  109  CO2 23  --   --   --   --   --  23  GLUCOSE 124*  --  121*  --   --   --  125*  BUN 18  --  22  --   --   --  16  CREATININE 1.09  --  0.90  --   --   --  0.97  CALCIUM 9.2  --   --   --   --   --  8.0*  MG  --   --   --   --   --   --  1.9  PHOS  --   --   --   --   --   --  2.8   < > = values in this interval not displayed.   GFR: Estimated Creatinine Clearance: 75.9 mL/min (by C-G formula based on SCr of 0.97 mg/dL). Recent Labs  Lab 01/27/20 1341 01/29/20 0545 01/30/20 0547  WBC 9.9 8.4 8.9    Liver Function Tests: Recent Labs  Lab 01/27/20 1341  AST 23  ALT 37  ALKPHOS 45  BILITOT 1.0  PROT 6.2*  ALBUMIN 4.1   No results for input(s): LIPASE, AMYLASE in the last 168 hours. No results for input(s): AMMONIA in the last 168 hours.  ABG    Component Value Date/Time   PHART 7.445 01/28/2020 0043   PCO2ART 32.7 01/28/2020 0043   PO2ART 161 (H) 01/28/2020 0043   HCO3 22.4 01/28/2020 0043   TCO2 23 01/28/2020 0043   ACIDBASEDEF 1.0 01/28/2020 0043   O2SAT 100.0 01/28/2020 0043     Coagulation Profile: Recent Labs  Lab 01/27/20 1341  INR 1.0    Cardiac Enzymes: No results for input(s): CKTOTAL, CKMB, CKMBINDEX, TROPONINI in the last 168 hours.  HbA1C: Hgb A1c MFr Bld  Date/Time Value Ref Range Status  01/28/2020 07:16 AM 5.6 4.8 - 5.6 % Final    Comment:    (NOTE)          Prediabetes: 5.7 - 6.4         Diabetes: >6.4  Glycemic control for adults with diabetes: <7.0     CBG: Recent Labs  Lab 01/27/20 1344  GLUCAP 125*     Critical care time:    Performed by: Omar Person  Total critical care time: 32 minutes  Critical care time was exclusive of separately billable procedures and treating other patients.  Critical care was necessary to treat or prevent imminent or life-threatening deterioration.  Critical care was time spent personally by me on the following activities: development of treatment plan with patient and/or surrogate as well as nursing, discussions with consultants, evaluation of patient's response to treatment, examination of patient, obtaining history from patient or surrogate, ordering and performing treatments and interventions, ordering and review of laboratory studies, ordering and review of radiographic studies, pulse oximetry and re-evaluation of patient's condition.  Hayden Pedro, AGACNP-BC [facility]  PCCM Pgr: [phone]

## 2020-01-30 NOTE — Progress Notes (Signed)
Found a charger cable in patient's room after transfer. I called [facility] and informed them that I will be tubing the charger to them and it is for [employer]. Acknowledged by [facility].

## 2020-01-30 NOTE — Progress Notes (Signed)
Spoke to neuro MD Lidzen about patient getting transfer orders out of [facility] ICU. Verbal order given to transfer patient to [facility].

## 2020-01-30 NOTE — Anesthesia Postprocedure Evaluation (Signed)
Anesthesia Post Note  Patient: Dustin Schneider  Procedure(s) Performed: IR WITH ANESTHESIA (N/A )     Patient location during evaluation: SICU Anesthesia Type: General Level of consciousness: sedated Pain management: pain level controlled Vital Signs Assessment: post-procedure vital signs reviewed and stable Respiratory status: patient remains intubated per anesthesia plan Cardiovascular status: stable Postop Assessment: no apparent nausea or vomiting Anesthetic complications: no   No complications documented.  Last Vitals:  Vitals:   01/30/20 0628 01/30/20 0700  BP: (!) 160/81 (!) 155/98  Pulse:  76  Resp:  (!) 26  Temp:    SpO2:  94%    Last Pain:  Vitals:   01/30/20 0400  TempSrc: Oral  PainSc: Asleep                 Farhiya Rosten

## 2020-01-30 NOTE — Progress Notes (Signed)
Pt arrived to unit alert and mute. Pt given call light and instructed to stay in bed unless a staff member is present. Pt gestured understanding. [facility] nurse tubed phone charger to [facility]. This RN and charge nurse searched pt's bed and belongings and are unable to find the pt's cell phone. Pt is aware.

## 2020-01-30 NOTE — Discharge Summary (Deleted)
Patient ID: Dustin Schneider   MRN: [id number]      DOB: 22-Dec-1955  Date of Admission: 01/27/2020 Date of Discharge: 01/30/2020  Attending Physician:  Rosalin Hawking, MD, Stroke MD Consultant(s):   Critical Care Medicine - Kipp Brood, MD Patient's PCP:  Patient, No Pcp Per  DISCHARGE DIAGNOSIS:   Lt MCA and ACA scattered infarcts -  infarct embolic secondary to known AF on Xarelto  s/p Neuro Interventional Radiology - Cerebral Angiogram with Intervention  TICI3 revascularization of L MCA & A3  Hyperlipidemia - LDL 33, goal < 70  NICM w/ EF 45% PTA  (EF 55 - 60% by echo this admission)    Tikosyn therapy PTA - Dr. [name]  (contacted Dr. [name] office and was told to re-start tikosyn at follow up with Dr. [name] after discharge)   Dysphagia -> Cortrak -> tube feedings  Acute ischemic stroke (HCC)  Paroxysmal atrial fibrillation ([employer])   Aspiration into airway  Chronic anticoagulation   Acute respiratory failure [facility])  Past Medical History:  Diagnosis Date  . Hypertension   . NICM (nonischemic cardiomyopathy) ([place]) 06/15/2018   04/23/18: EF 45%, 09/15/18: NOrmal LVEF  . Paroxysmal atrial fibrillation ([place]) 06/15/2018  . Visit for monitoring Tikosyn therapy 06/12/2018   Past Surgical History:  Procedure Laterality Date  . CARDIOVERSION N/A 05/13/2018   Procedure: CARDIOVERSION;  Surgeon: [name], MD;  Location: [facility] ENDOSCOPY;  Service: Cardiovascular;  Laterality: N/A;  . CARDIOVERSION N/A 06/13/2018   Procedure: CARDIOVERSION;  Surgeon: [name], MD;  Location: [facility] ENDOSCOPY;  Service: Cardiovascular;  Laterality: N/A;  . CARDIOVERSION N/A 06/23/2018   Procedure: CARDIOVERSION;  Surgeon: [name], MD;  Location: [place];  Service: Cardiovascular;  Laterality: N/A;  . excision of actinic keratosis    . EYE SURGERY     eye lift  . IR PERCUTANEOUS ART THROMBECTOMY/INFUSION INTRACRANIAL INC DIAG ANGIO  01/27/2020      . RADIOLOGY WITH ANESTHESIA N/A 01/27/2020    Procedure: IR WITH ANESTHESIA;  Surgeon: Radiologist, Medication, MD;  Location: [place];  Service: Radiology;  Laterality: N/A;  . TONSILLECTOMY    . [place] .  stroke: mapping our early stages of recovery book   Does not apply Once  . aspirin  325 mg Per Tube Daily   Or  . aspirin  300 mg Rectal Daily  . chlorhexidine gluconate (MEDLINE KIT)  15 mL Mouth Rinse BID  . Chlorhexidine Gluconate Cloth  6 each Topical Daily  . docusate  100 mg Per Tube BID  . feeding supplement (PRO-STAT SUGAR FREE 64)  30 mL Per Tube BID  . heparin injection (subcutaneous)  5,000 Units Subcutaneous Q8H  . mouth rinse  15 mL Mouth Rinse q12n4p  . pantoprazole sodium  40 mg Per Tube Q1200  . polyethylene glycol  17 g Per Tube Daily  . rosuvastatin  10 mg Per Tube Daily    HOME MEDICATIONS PRIOR TO ADMISSION Medications Prior to Admission  Medication Sig Dispense Refill  . dofetilide (TIKOSYN) 500 MCG capsule Take 1 capsule (500 mcg total) by mouth 2 (two) times daily. 180 capsule 3  . MAGNESIUM CITRATE PO Take 250 mg by mouth in the morning and at bedtime.     . metoprolol succinate (TOPROL-XL) 100 MG 24 hr tablet TAKE 1 TABLET BY MOUTH TWICE DAILY. (Patient taking differently: Take 100 mg by mouth 2 (two) times daily. ) 180 tablet  1  . Potassium 99 MG TABS Take 99 mg by mouth daily.    . rosuvastatin (CRESTOR) 10 MG tablet Take 1 tablet (10 mg total) by mouth daily. 90 tablet 3  . valsartan (DIOVAN) 320 MG tablet TAKE ONE TABLET AT BEDTIME. (Patient taking differently: Take 320 mg by mouth at bedtime. ) 90 tablet 1  . XARELTO 20 MG TABS tablet TAKE ONE TABLET IN THE EVENING AFTER DINNER. (Patient taking differently: Take 20 mg by mouth daily after supper. ) 90 tablet 0      LABORATORY STUDIES CBC    Component Value Date/Time   WBC 8.9 01/30/2020 0547   RBC 3.86 (L) 01/30/2020 0547   HGB 12.3 (L) 01/30/2020 0547   HGB 15.3 12/17/2019 0831   HCT 37.5 (L)  01/30/2020 0547   HCT 45.5 12/17/2019 0831   PLT 137 (L) 01/30/2020 0547   PLT 220 12/17/2019 0831   MCV 97.2 01/30/2020 0547   MCV 94 12/17/2019 0831   MCH 31.9 01/30/2020 0547   MCHC 32.8 01/30/2020 0547   RDW 13.6 01/30/2020 0547   RDW 13.3 12/17/2019 0831   LYMPHSABS 3.9 01/27/2020 1341   MONOABS 0.8 01/27/2020 1341   EOSABS 0.2 01/27/2020 1341   BASOSABS 0.1 01/27/2020 1341   CMP    Component Value Date/Time   NA 140 01/30/2020 0547   NA 139 12/17/2019 0836   K 4.1 01/30/2020 0547   CL 108 01/30/2020 0547   CO2 25 01/30/2020 0547   GLUCOSE 138 (H) 01/30/2020 0547   BUN 15 01/30/2020 0547   BUN 21 12/17/2019 0836   CREATININE 0.82 01/30/2020 0547   CALCIUM 8.5 (L) 01/30/2020 0547   PROT 6.2 (L) 01/27/2020 1341   PROT 6.4 12/17/2019 0836   ALBUMIN 4.1 01/27/2020 1341   ALBUMIN 4.4 12/17/2019 0836   AST 23 01/27/2020 1341   ALT 37 01/27/2020 1341   ALKPHOS 45 01/27/2020 1341   BILITOT 1.0 01/27/2020 1341   BILITOT 0.7 12/17/2019 0836   GFRNONAA >60 01/30/2020 0547   GFRAA >60 01/30/2020 0547   COAGS Lab Results  Component Value Date   INR 1.0 01/27/2020   Lipid Panel    Component Value Date/Time   CHOL 125 01/28/2020 0716   CHOL 226 (H) 12/17/2019 0831   TRIG 298 (H) 01/28/2020 0716   HDL 32 (L) 01/28/2020 0716   HDL 39 (L) 12/17/2019 0831   CHOLHDL 3.9 01/28/2020 0716   VLDL 60 (H) 01/28/2020 0716   LDLCALC 33 01/28/2020 0716   LDLCALC 154 (H) 12/17/2019 0831   HgbA1C  Lab Results  Component Value Date   HGBA1C 5.6 01/28/2020   Urinalysis No results found for: COLORURINE, APPEARANCEUR, LABSPEC, PHURINE, GLUCOSEU, HGBUR, BILIRUBINUR, KETONESUR, PROTEINUR, UROBILINOGEN, NITRITE, LEUKOCYTESUR Urine Drug Screen No results found for: LABOPIA, COCAINSCRNUR, LABBENZ, AMPHETMU, THCU, LABBARB  Alcohol Level No results found for: ETH   SIGNIFICANT DIAGNOSTIC STUDIES  MR ANGIO HEAD WO CONTRAST  Result Date: 01/28/2020 CLINICAL DATA:  64 year old male  code stroke presentation with left MCA occlusion, left ACA A3 occlusion. Endovascular reperfusion yesterday. EXAM: MRI HEAD WITHOUT CONTRAST MRA HEAD WITHOUT CONTRAST TECHNIQUE: Multiplanar, multiecho pulse sequences of the brain and surrounding structures were obtained without intravenous contrast. Angiographic images of the head were obtained using MRA technique without contrast. COMPARISON:  CTA head and neck yesterday. FINDINGS: MRI HEAD FINDINGS Brain: Dense restricted diffusion in the left caudate and ventral putamen, with associated cytotoxic edema and caudate swelling. Petechial hemorrhage (Heidelberg Classification 1A,  series 8, image 48). There are also a few punctate foci of restricted diffusion in the nearby left inferior frontal gyrus and left insula, such as on series 2, image 28. Superimposed gyriform restricted diffusion along the left cingulate, and also involving posterior left superior frontal gyrus and parietal lobe (series 2, image 44 and series 4, image 17. T2 and FLAIR hyperintensity with gyral edema. No hemorrhage. One or 2 punctate areas of contralateral right caudate restricted diffusion, most notably series 2, image 38. T2 and FLAIR hyperintensity with no hemorrhage or mass effect. No other restricted diffusion. Chronic appearing hemosiderin in right inferior frontal gyrus has associated dark T2 rim and punctate internal T2 hyperintensity compatible with a small cavernous venous vascular malformation. No developmental venous anomaly associated on CTA. No regional edema or mass effect. Trace subarachnoid hemorrhage would be difficult to exclude such as in the left occipital lobe sulci on series 7, image 14. Also questionable trace blood in the left temporal and occipital horns, versus subependymal chronic microhemorrhages there. No ventriculomegaly. No midline shift. Basilar cisterns remain normal. Cervicomedullary junction and pituitary are within normal limits. Outside of the acute  findings there are a few scattered areas of subcortical white matter T2 and FLAIR hyperintensity. The right basal ganglia, thalami, brainstem and cerebellum appear negative. Vascular: Major intracranial vascular flow voids are preserved. Skull and upper cervical spine: Negative visible cervical spine, bone marrow signal. Sinuses/Orbits: Negative orbits. Mild to moderate paranasal sinus mucosal thickening. Other: Intubated.  Fluid in the pharynx.  Mastoids remain clear. MRA HEAD FINDINGS Antegrade flow in the posterior circulation with mildly dominant left V4 segment and no distal vertebral stenosis. Patent PICA origins, vertebrobasilar junction, basilar artery, AICA origins, SCA and PCA origins. Tortuous left P1 and fetal type left PCA on that side. Small right posterior communicating artery. Bilateral PCA branches are within normal limits. Antegrade flow in both ICA siphons, the left appears dominant with dominant left ACA. No siphon stenosis. Patent carotid termini. Normal ophthalmic and posterior communicating artery origins. Patent MCA and ACA origins. Anterior communicating artery and visible ACA branches are within normal limits. Restored patency on the left. Left MCA M1 segment and left MCA trifurcation are patent without stenosis. Left MCA branches appear within normal limits. Right MCA bifurcates early without stenosis. Visible right MCA branches are within normal limits. IMPRESSION: 1. Negative intracranial MRA, patent left MCA and ACA. 2. Confluent left basal ganglia infarct with edema and mild swelling. Left cingulate and additional left superior frontal / superior perirolandic gyral restricted diffusion with mild cortical edema. A few punctate foci of restricted diffusion elsewhere including at the right cingulate. 3. Heidelberg Classification 1A petechial hemorrhage at the caudate. Difficult to exclude trace left hemisphere SAH and IVH, repeat plain Head CT could evaluate further. No significant  intracranial mass effect. 4. 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 Electronically Signed   By: Genevie Ann M.D.   On: 01/28/2020 09:45   MR BRAIN WO CONTRAST  Result Date: 01/28/2020 CLINICAL DATA:  64 year old male code stroke presentation with left MCA occlusion, left ACA A3 occlusion. Endovascular reperfusion yesterday. EXAM: MRI HEAD WITHOUT CONTRAST MRA HEAD WITHOUT CONTRAST TECHNIQUE: Multiplanar, multiecho pulse sequences of the brain and surrounding structures were obtained without intravenous contrast. Angiographic images of the head were obtained using MRA technique without contrast. COMPARISON:  CTA head and neck yesterday. FINDINGS: MRI HEAD FINDINGS Brain: Dense restricted diffusion in the left caudate and ventral putamen, with associated cytotoxic edema and caudate swelling.  Petechial hemorrhage (Heidelberg Classification 1A, series 8, image 48). There are also a few punctate foci of restricted diffusion in the nearby left inferior frontal gyrus and left insula, such as on series 2, image 28. Superimposed gyriform restricted diffusion along the left cingulate, and also involving posterior left superior frontal gyrus and parietal lobe (series 2, image 44 and series 4, image 17. T2 and FLAIR hyperintensity with gyral edema. No hemorrhage. One or 2 punctate areas of contralateral right caudate restricted diffusion, most notably series 2, image 38. T2 and FLAIR hyperintensity with no hemorrhage or mass effect. No other restricted diffusion. Chronic appearing hemosiderin in right inferior frontal gyrus has associated dark T2 rim and punctate internal T2 hyperintensity compatible with a small cavernous venous vascular malformation. No developmental venous anomaly associated on CTA. No regional edema or mass effect. Trace subarachnoid hemorrhage would be difficult to exclude such as in the left occipital lobe sulci on series 7, image 14. Also questionable trace blood in  the left temporal and occipital horns, versus subependymal chronic microhemorrhages there. No ventriculomegaly. No midline shift. Basilar cisterns remain normal. Cervicomedullary junction and pituitary are within normal limits. Outside of the acute findings there are a few scattered areas of subcortical white matter T2 and FLAIR hyperintensity. The right basal ganglia, thalami, brainstem and cerebellum appear negative. Vascular: Major intracranial vascular flow voids are preserved. Skull and upper cervical spine: Negative visible cervical spine, bone marrow signal. Sinuses/Orbits: Negative orbits. Mild to moderate paranasal sinus mucosal thickening. Other: Intubated.  Fluid in the pharynx.  Mastoids remain clear. MRA HEAD FINDINGS Antegrade flow in the posterior circulation with mildly dominant left V4 segment and no distal vertebral stenosis. Patent PICA origins, vertebrobasilar junction, basilar artery, AICA origins, SCA and PCA origins. Tortuous left P1 and fetal type left PCA on that side. Small right posterior communicating artery. Bilateral PCA branches are within normal limits. Antegrade flow in both ICA siphons, the left appears dominant with dominant left ACA. No siphon stenosis. Patent carotid termini. Normal ophthalmic and posterior communicating artery origins. Patent MCA and ACA origins. Anterior communicating artery and visible ACA branches are within normal limits. Restored patency on the left. Left MCA M1 segment and left MCA trifurcation are patent without stenosis. Left MCA branches appear within normal limits. Right MCA bifurcates early without stenosis. Visible right MCA branches are within normal limits. IMPRESSION: 1. Negative intracranial MRA, patent left MCA and ACA. 2. Confluent left basal ganglia infarct with edema and mild swelling. Left cingulate and additional left superior frontal / superior perirolandic gyral restricted diffusion with mild cortical edema. A few punctate foci of  restricted diffusion elsewhere including at the right cingulate. 3. Heidelberg Classification 1A petechial hemorrhage at the caudate. Difficult to exclude trace left hemisphere SAH and IVH, repeat plain Head CT could evaluate further. No significant intracranial mass effect. 4. 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 Electronically Signed   By: Genevie Ann M.D.   On: 01/28/2020 09:45   DG CHEST PORT 1 VIEW  Result Date: 01/30/2020 CLINICAL DATA:  Re-evaluate ETT. EXAM: PORTABLE CHEST 1 VIEW COMPARISON:  January 27, 2020 FINDINGS: The ET tube is no longer visualized. The feeding tube terminates below today's film. No pneumothorax. Vascular crowding in the medial right lung base. Mild haziness over the left chest may be due to patient rotation. No focal infiltrates are noted. IMPRESSION: 1. The purpose of the study is to evaluate for an ETT. No ET tube identified. 2. The feeding tube terminates  below today's film. 3. No other acute abnormalities. These results will be called to the ordering clinician or representative by the Radiologist Assistant, and communication documented in the PACS or [employer]. Electronically Signed   By: Dorise Bullion III M.D   On: 01/30/2020 10:56   DG CHEST PORT 1 VIEW  Result Date: 01/27/2020 CLINICAL DATA:  Stroke, intubated EXAM: PORTABLE CHEST 1 VIEW COMPARISON:  None. FINDINGS: Single frontal view of the chest demonstrates unremarkable cardiac silhouette. Ectasia of the thoracic aorta. Endotracheal tube overlies tracheal air column tip well above carina. Enteric catheter passes below diaphragm tip and side port project over gastric fundus. No airspace disease, effusion, or pneumothorax. No acute bony abnormalities. IMPRESSION: 1. Support devices as above. 2. No acute intrathoracic process. Electronically Signed   By: Randa Ngo M.D.   On: 01/27/2020 19:08   ECHOCARDIOGRAM COMPLETE  Result Date: 01/28/2020    ECHOCARDIOGRAM REPORT    Patient Name:   Dustin Schneider Date of Exam: 01/28/2020 Medical Rec #:  [id number]        Height:       69.0 in Accession #:    [id number]       Weight:       185.2 lb Date of Birth:  1956-07-26        BSA:          2.000 m Patient Age:    71 years         BP:           133/81 mmHg Patient Gender: M                HR:           52 bpm. Exam Location:  Inpatient Procedure: 2D Echo, 3D Echo, Color Doppler and Cardiac Doppler Indications:    Stroke i163.9  History:        Patient has prior history of Echocardiogram examinations, most                 recent 09/16/2018. Arrythmias:Atrial Fibrillation; Risk                 Factors:Hypertension.  Sonographer:    Raquel Sarna Senior RDCS Referring Phys: [id number] ASHISH ARORA  Sonographer Comments: Echo performed with patient supine and on artificial respirator. IMPRESSIONS  1. Left ventricular ejection fraction, by estimation, is 55 to 60%. The left ventricle has normal function. The left ventricle has no regional wall motion abnormalities. Left ventricular diastolic parameters are consistent with Grade II diastolic dysfunction (pseudonormalization).  2. Right ventricular systolic function is normal. The right ventricular size is normal. There is mildly elevated pulmonary artery systolic pressure.  3. Left atrial size was moderately dilated.  4. Right atrial size was moderately dilated.  5. The mitral valve is normal in structure. Mild mitral valve regurgitation.  6. The aortic valve is normal in structure. Aortic valve regurgitation is mild to moderate.  7. The inferior vena cava is dilated in size with >50% respiratory variability, suggesting right atrial pressure of 8 mmHg. FINDINGS  Left Ventricle: Left ventricular ejection fraction, by estimation, is 55 to 60%. The left ventricle has normal function. The left ventricle has no regional wall motion abnormalities. The left ventricular internal cavity size was normal in size. There is  no left ventricular hypertrophy. Left  ventricular diastolic parameters are consistent with Grade II diastolic dysfunction (pseudonormalization). Right Ventricle: The right ventricular size is normal. No increase in right ventricular wall thickness. Right  ventricular systolic function is normal. There is mildly elevated pulmonary artery systolic pressure. The tricuspid regurgitant velocity is 2.35  m/s, and with an assumed right atrial pressure of 15 mmHg, the estimated right ventricular systolic pressure is 62.6 mmHg. Left Atrium: Left atrial size was moderately dilated. Right Atrium: Right atrial size was moderately dilated. Pericardium: There is no evidence of pericardial effusion. Mitral Valve: The mitral valve is normal in structure. There is mild prolapse of of the mitral valve. Mild mitral valve regurgitation. Tricuspid Valve: The tricuspid valve is normal in structure. Tricuspid valve regurgitation is trivial. Aortic Valve: The aortic valve is normal in structure. Aortic valve regurgitation is mild to moderate. Pulmonic Valve: The pulmonic valve was normal in structure. Pulmonic valve regurgitation is trivial. Aorta: The aortic root and ascending aorta are structurally normal, with no evidence of dilitation. Venous: The inferior vena cava is dilated in size with greater than 50% respiratory variability, suggesting right atrial pressure of 8 mmHg. IAS/Shunts: No atrial level shunt detected by color flow Doppler.  LEFT VENTRICLE PLAX 2D LVIDd:         5.40 cm  Diastology LVIDs:         3.30 cm  LV e' lateral:   12.00 cm/s LV PW:         0.70 cm  LV E/e' lateral: 8.3 LV IVS:        0.70 cm  LV e' medial:    10.20 cm/s LVOT diam:     2.20 cm  LV E/e' medial:  9.8 LV SV:         82 LV SV Index:   41 LVOT Area:     3.80 cm  RIGHT VENTRICLE RV S prime:     9.36 cm/s TAPSE (M-mode): 2.4 cm LEFT ATRIUM             Index       RIGHT ATRIUM           Index LA diam:        2.90 cm 1.45 cm/m  RA Area:     25.70 cm LA Vol (A2C):   79.6 ml 39.81 ml/m RA  Volume:   71.90 ml  35.96 ml/m LA Vol (A4C):   49.8 ml 24.91 ml/m LA Biplane Vol: 67.8 ml 33.91 ml/m  AORTIC VALVE LVOT Vmax:   102.00 cm/s LVOT Vmean:  61.600 cm/s LVOT VTI:    0.216 m  AORTA Ao Root diam: 3.60 cm Ao Asc diam:  3.10 cm MITRAL VALVE               TRICUSPID VALVE MV Area (PHT): 4.31 cm    TR Peak grad:   22.1 mmHg MV Decel Time: 176 msec    TR Vmax:        235.00 cm/s MV E velocity: 99.80 cm/s MV A velocity: 32.60 cm/s  SHUNTS MV E/A ratio:  3.06        Systemic VTI:  0.22 m                            Systemic Diam: 2.20 cm Glori Bickers MD Electronically signed by Glori Bickers MD Signature Date/Time: 01/28/2020/2:18:14 PM    Final    CT HEAD CODE STROKE WO CONTRAST  Result Date: 01/27/2020 CLINICAL DATA:  Code stroke.  Stroke.  Last seen normal 1 hour ago. EXAM: CT HEAD WITHOUT CONTRAST TECHNIQUE: Contiguous axial images were obtained from the  base of the skull through the vertex without intravenous contrast. COMPARISON:  None. FINDINGS: Brain: No evidence of acute infarction, hemorrhage, hydrocephalus, extra-axial collection or mass lesion/mass effect. Vascular: Hyperdense left M2 segment in the sylvian fissure compatible with acute thrombus. No other hyperdense vessel. Skull: Negative Sinuses/Orbits: Retention cyst left maxillary sinus. Negative orbit. Other: None ASPECTS ([place] Stroke Program Early CT Score) - Ganglionic level infarction (caudate, lentiform nuclei, internal capsule, insula, M1-M3 cortex): 7 - Supraganglionic infarction (M4-M6 cortex): 3 Total score (0-10 with 10 being normal): 10 IMPRESSION: 1. Hyperdense left M2 branch compatible with acute thrombus. No acute infarct or hemorrhage 2. ASPECTS is 10 3. These results were called by telephone at the time of interpretation on 01/27/2020 at 1:56 pm to provider Rory Percy , who verbally acknowledged these results. Electronically Signed   By: Franchot Gallo M.D.   On: 01/27/2020 13:57   CT ANGIO HEAD CODE STROKE  Result  Date: 01/27/2020 CLINICAL DATA:  Stroke.  Aphasia. EXAM: CT ANGIOGRAPHY HEAD AND NECK TECHNIQUE: Multidetector CT imaging of the head and neck was performed using the standard protocol during bolus administration of intravenous contrast. Multiplanar CT image reconstructions and MIPs were obtained to evaluate the vascular anatomy. Carotid stenosis measurements (when applicable) are obtained utilizing NASCET criteria, using the distal internal carotid diameter as the denominator. CONTRAST:  63m OMNIPAQUE IOHEXOL 350 MG/ML SOLN COMPARISON:  CT head 01/27/2020 FINDINGS: CTA NECK FINDINGS Aortic arch: 4 vessel arch. Left vertebral artery origin from the arch. Proximal great vessels widely patent. Minimal atherosclerotic disease in the aortic arch. Right carotid system: Right carotid widely patent without significant stenosis. Left carotid system: Mild atherosclerotic disease left carotid bifurcation without significant stenosis. Vertebral arteries: Both vertebral arteries patent to the basilar without significant stenosis. Skeleton: Cervical spondylosis.  No acute skeletal abnormality. Other neck: Negative for soft tissue mass or edema in the neck. Upper chest: Lung apices clear bilaterally. Review of the MIP images confirms the above findings CTA HEAD FINDINGS Anterior circulation: Mild atherosclerotic disease in the cavernous carotid bilaterally without significant stenosis. Occlusion of the left middle cerebral artery at the bifurcation. There is a small early branch on the left MCA which extends to the parietal lobe. There is clot within this vessel. However there is occlusion of the majority of the superior and inferior divisions of the left MCA . Minimal collateral flow on delayed imaging. Both anterior cerebral arteries widely patent without stenosis. Right A1 segment is hypoplastic. Right middle cerebral artery widely patent without stenosis. Posterior circulation: Both vertebral arteries patent to the basilar.  PICA patent bilaterally. Basilar widely patent. AICA, superior cerebellar, and posterior cerebral arteries patent bilaterally without stenosis. Fetal origin left posterior cerebral artery. Venous sinuses: Normal venous enhancement. Anatomic variants: None Review of the MIP images confirms the above findings IMPRESSION: 1. Acute occlusion left MCA bifurcation with little flow in the superior and inferior divisions left MCA. There is an early branch supplying a part of the left parietal lobe. Minimal collateral flow. 2. No other intracranial stenosis or occlusion. 3. No significant carotid or vertebral artery stenosis in the neck. 4. These results were called by telephone at the time of interpretation on 01/27/2020 at 2:41 pm to provider ARory Percy, who verbally acknowledged these results. Electronically Signed   By: CFranchot GalloM.D.   On: 01/27/2020 14:41   CT ANGIO NECK CODE STROKE  Result Date: 01/27/2020 CLINICAL DATA:  Stroke.  Aphasia. EXAM: CT ANGIOGRAPHY HEAD AND NECK TECHNIQUE: Multidetector CT imaging of  the head and neck was performed using the standard protocol during bolus administration of intravenous contrast. Multiplanar CT image reconstructions and MIPs were obtained to evaluate the vascular anatomy. Carotid stenosis measurements (when applicable) are obtained utilizing NASCET criteria, using the distal internal carotid diameter as the denominator. CONTRAST:  54m OMNIPAQUE IOHEXOL 350 MG/ML SOLN COMPARISON:  CT head 01/27/2020 FINDINGS: CTA NECK FINDINGS Aortic arch: 4 vessel arch. Left vertebral artery origin from the arch. Proximal great vessels widely patent. Minimal atherosclerotic disease in the aortic arch. Right carotid system: Right carotid widely patent without significant stenosis. Left carotid system: Mild atherosclerotic disease left carotid bifurcation without significant stenosis. Vertebral arteries: Both vertebral arteries patent to the basilar without significant stenosis. Skeleton:  Cervical spondylosis.  No acute skeletal abnormality. Other neck: Negative for soft tissue mass or edema in the neck. Upper chest: Lung apices clear bilaterally. Review of the MIP images confirms the above findings CTA HEAD FINDINGS Anterior circulation: Mild atherosclerotic disease in the cavernous carotid bilaterally without significant stenosis. Occlusion of the left middle cerebral artery at the bifurcation. There is a small early branch on the left MCA which extends to the parietal lobe. There is clot within this vessel. However there is occlusion of the majority of the superior and inferior divisions of the left MCA . Minimal collateral flow on delayed imaging. Both anterior cerebral arteries widely patent without stenosis. Right A1 segment is hypoplastic. Right middle cerebral artery widely patent without stenosis. Posterior circulation: Both vertebral arteries patent to the basilar. PICA patent bilaterally. Basilar widely patent. AICA, superior cerebellar, and posterior cerebral arteries patent bilaterally without stenosis. Fetal origin left posterior cerebral artery. Venous sinuses: Normal venous enhancement. Anatomic variants: None Review of the MIP images confirms the above findings IMPRESSION: 1. Acute occlusion left MCA bifurcation with little flow in the superior and inferior divisions left MCA. There is an early branch supplying a part of the left parietal lobe. Minimal collateral flow. 2. No other intracranial stenosis or occlusion. 3. No significant carotid or vertebral artery stenosis in the neck. 4. These results were called by telephone at the time of interpretation on 01/27/2020 at 2:41 pm to provider ARory Percy, who verbally acknowledged these results. Electronically Signed   By: CFranchot GalloM.D.   On: 01/27/2020 14:41      HISTORY OF PRESENT ILLNESS Dustin BAUTCHis a 64y.o. male with history of paroxysmal A. fib on chronic Xarelto, nonischemic cardiomyopathy and hypertension.  [facility]  Patient was noted to come out of the bathroom and suddenly collapsed by bystanders.  EMS was called.  On arrival they noted that he had a left forced gaze and right hemiplegia.  Patient was also nauseous and vomiting.  At the bridge patient was able to follow commands such as closing his eyes and opening his eyes, making a fist.  Due to patient being on Xarelto he was unable to receive TPA.  CT head was immediately obtained which did not show a bleed.  CTA of head and neck was immediately obtained and showed a dense left MCA.  Son was called, explained the emergent IR procedure, risks and benefits and consent was given.  At that point patient was immediately brought to the interventional radiology suite for thrombectomy.  LKW: 1300 tpa given?: no, on Xarelto - last dose likely 6pm yesterday Premorbid modified Rankin scale (mRS): 0 NIH stroke scale: 1[place]. JMERT DIETRICKis a 64y.o.  male with history of paroxysmal A. fib on chronic Xarelto, nonischemic cardiomyopathy and hypertension presenting with L forced gaze and R hemiplegia with nausea and vomiting. Not a tPA candidate as on xarelto. Taken to IR for L MCA occlusion.  Stroke:   L MCA and ACA scattered infarcts s/p TICI3 revascularization of L MCA & A3, infarct embolic secondary to known AF on Xarelto  Code Stroke CT head Hyperdense L M2. ASPECTS 10.     CTA head & neck L MCA bifurcation occlusion w/ little distal flow. Minimal collateral flow.  Cerebral angio distal L M1 occlusion, distal L A3 occlusion. TICI 3 revascularization MCA and ACA   Post IR CT no hemorrhage  MRI  L basal ganglia infarct w/ mild edema. L cingulate and L superior frontal lobe infarcts w/ mild cortical edema. Few punctate infarcts R cingulate. Petechial hemorrhage at caudate. Small R cavernous malformation.   MRA  L MCA and ACA open.   2D Echo - EF 55 - 60%. No cardiac source of emboli identified.   LDL  33  HgbA1c - 5.6   SCDs for VTE prophylaxis  Xarelto daily prior to admission, now on aspirin 325 po. Will consider to switch to eliquis in 3-5 days post stroke  Therapy recommendations: CIR  Disposition:  Discharged to [facility]  Acute Respiratory Failure Possible Aspiration  N&V in ED  Intubated for IR, left intubated following given possible aspiration  Off sedation  Extubated 6/18  CXR unremarkable  WBC 9.9  CCM on board  Atrial Fibrillation  Home anticoagulation:  Xarelto (rivaroxaban) daily   Home meds:  metoprolol 100 daily, tikosyn 500 bid  Not an AC at this time given post IR petechial hemorrhage.   Now on ASA  Will consider to switch to eliquis in 3-5 days.    Hypertension  Home meds:  metoprolol 100 daily, valsartan 320  BP goal < 160  Off phenylephrine   Long-term BP goal normotensive  Hyperlipidemia  Home meds:  crestor 10, resumed in hospital  LDL 33, goal < 70  Continue statin at discharge  Dysphagia  Secondary to stroke  NPO  On cortrak   On TF @ 4  Speech on board   Other Stroke Risk Factors  Advanced age  NICM w/ EF 45% PTA  (EF 68 - 60% by echo this admission) - on tikosyn PTA - Dr. [name] pt - contacted Dr. [name] office and was told to re-start tikosyn at follow up with Dr. [name] after discharge.   RN Pressure Injury Documentation:     Malnutrition Type:  Nutrition Problem: Inadequate oral intake Etiology: lethargy/confusion   Malnutrition Characteristics:  Signs/Symptoms: NPO status   Nutrition Interventions:  Interventions: Prostat, Tube feeding  DISCHARGE EXAM Vitals:   01/30/20 1400 01/30/20 1500 01/30/20 1600 01/30/20 1700  BP: (!) 150/80 (!) 153/103 (!) 158/95 (!) 157/98  Pulse: (!) 58 65 62 63  Resp: 20 (!) 25 (!) 21 (!) 21  Temp:   98.1 F (36.7 C)   TempSrc:   Oral   SpO2: 98% 96% 99% 100%  Weight:      Height:       General - Well nourished, well developed, extubated  and tolerating well.  Ophthalmologic - fundi not visualized due to noncooperation.  Cardiovascular - irregularly irregular heart rate and rhythm  Neuro - extubated, awake alert, eyes open, expressive aphasia, nonverbal, not name or repeat. However, able to follow simple central and peripheral commands on the left.  Eyes in mid position, EOMI. Able to blink to visual threat bilaterally, PERRL. Right facial droop.  Tongue protrusion midline. Left UE at least 4/5 and against gravity, LLE at least 3+/5. However, on pain stimulation, 2-/5 RUE and 0/5 RLE. DTR diminished and no babinski. Sensation, coordination not cooperative and gait not tested.    DISCHARGE PLAN  Disposition:  Discharge to [facility] for ongoing PT, OT and ST  aspirin 325 mg daily for secondary stroke prevention.  Recommend ongoing risk factor control by Primary Care Physician at time of discharge from inpatient rehabilitation.  Follow-up Patient, No Pcp Per in 2 weeks following discharge from rehab.  Follow-up in [facility] in 4 weeks following discharge from rehab, office to schedule an appointment.   35 minutes were spent preparing discharge.  Mikey Bussing PA-C [facility] Pager [phone] 01/30/2020, 6:10 PM

## 2020-01-31 LAB — BASIC METABOLIC PANEL
Anion gap: 9 (ref 5–15)
BUN: 14 mg/dL (ref 8–23)
CO2: 26 mmol/L (ref 22–32)
Calcium: 8.6 mg/dL — ABNORMAL LOW (ref 8.9–10.3)
Chloride: 104 mmol/L (ref 98–111)
Creatinine, Ser: 0.8 mg/dL (ref 0.61–1.24)
GFR calc Af Amer: >60 mL/min (ref >60)
GFR calc non Af Amer: >60 mL/min (ref >60)
Glucose, Bld: 139 mg/dL — ABNORMAL HIGH (ref 70–99)
Potassium: 3.4 mmol/L — ABNORMAL LOW (ref 3.5–5.1)
Sodium: 139 mmol/L (ref 135–145)

## 2020-01-31 LAB — GLUCOSE, CAPILLARY
Glucose-Capillary: 138 mg/dL — ABNORMAL HIGH (ref 70–99)
Glucose-Capillary: 140 mg/dL — ABNORMAL HIGH (ref 70–99)
Glucose-Capillary: 133 mg/dL — ABNORMAL HIGH (ref 70–99)
Glucose-Capillary: 119 mg/dL — ABNORMAL HIGH (ref 70–99)
Glucose-Capillary: 142 mg/dL — ABNORMAL HIGH (ref 70–99)

## 2020-01-31 LAB — CBC
HCT: 37.7 % — ABNORMAL LOW (ref 39.0–52.0)
Hemoglobin: 12.7 g/dL — ABNORMAL LOW (ref 13.0–17.0)
MCH: 31.6 pg (ref 26.0–34.0)
MCHC: 33.7 g/dL (ref 30.0–36.0)
MCV: 93.8 fL (ref 80.0–100.0)
Platelets: 148 10*3/uL — ABNORMAL LOW (ref 150–400)
RBC: 4.02 MIL/uL — ABNORMAL LOW (ref 4.22–5.81)
RDW: 13.4 % (ref 11.5–15.5)
WBC: 8.4 10*3/uL (ref 4.0–10.5)
nRBC: 0 % (ref 0.0–0.2)

## 2020-01-31 LAB — PHOSPHORUS: Phosphorus: 3.2 mg/dL (ref 2.5–4.6)

## 2020-01-31 MED ORDER — BISACODYL 10 MG RE SUPP
10.0000 mg | Freq: Every day | RECTAL | Status: DC | PRN
  Administered 2020-01-31: 10 mg via RECTAL
  Filled 2020-01-31: qty 1

## 2020-01-31 MED ORDER — HYDRALAZINE HCL 20 MG/ML IJ SOLN
5.0000 mg | Freq: Once | INTRAMUSCULAR | Status: DC
  Filled 2020-01-31: qty 1

## 2020-01-31 MED ORDER — FREE WATER
30.0000 mL | Status: DC
  Administered 2020-01-31 – 2020-02-02 (×15): 30 mL

## 2020-01-31 MED ORDER — IRBESARTAN 150 MG PO TABS
150.0000 mg | ORAL_TABLET | Freq: Every day | ORAL | Status: DC
  Administered 2020-01-31: 150 mg via ORAL
  Filled 2020-01-31: qty 1

## 2020-01-31 MED ORDER — POTASSIUM CHLORIDE 20 MEQ/15ML (10%) PO SOLN
40.0000 meq | Freq: Once | ORAL | Status: AC
  Administered 2020-01-31: 40 meq
  Filled 2020-01-31: qty 30

## 2020-01-31 MED ORDER — METOPROLOL SUCCINATE ER 100 MG PO TB24
100.0000 mg | ORAL_TABLET | Freq: Two times a day (BID) | ORAL | Status: DC
  Filled 2020-01-31: qty 1

## 2020-01-31 MED ORDER — CHLORHEXIDINE GLUCONATE 0.12 % MT SOLN
OROMUCOSAL | Status: AC
  Administered 2020-01-31: 15 mL via OROMUCOSAL
  Filled 2020-01-31: qty 15

## 2020-01-31 MED ORDER — METOPROLOL TARTRATE 25 MG/10 ML ORAL SUSPENSION
100.0000 mg | Freq: Two times a day (BID) | ORAL | Status: DC
  Administered 2020-01-31 – 2020-02-02 (×4): 100 mg
  Filled 2020-01-31 (×5): qty 40

## 2020-01-31 MED ORDER — IRBESARTAN 150 MG PO TABS
150.0000 mg | ORAL_TABLET | Freq: Every day | ORAL | Status: DC
  Administered 2020-02-01 – 2020-02-02 (×2): 150 mg
  Filled 2020-01-31 (×2): qty 1

## 2020-01-31 NOTE — Progress Notes (Signed)
STROKE TEAM PROGRESS NOTE   INTERVAL HISTORY The wife is at the bedside.  She reports that he continues to try to do things with his left hand and is using his iPhone to listen to music.  He continues to be mute however.  He did get accepted to rehab but transfer was not done yesterday as expected.  Vitals:   01/30/20 2200 01/30/20 2301 01/31/20 0301 01/31/20 1150  BP: (!) 164/96 (!) 158/94 (!) 149/100 (!) 138/94  Pulse: 73 69 60 96  Resp: (!) 23 (!) 22 (!) 22 18  Temp:  97.6 F (36.4 C) 99.4 F (37.4 C) 97.6 F (36.4 C)  TempSrc:  Oral Oral Oral  SpO2: 96% 98% 96% 97%  Weight:      Height:       CBC:  Recent Labs  Lab 01/27/20 1341 01/27/20 1346 01/30/20 0547 01/31/20 0519  WBC 9.9   < > 8.9 8.4  NEUTROABS 4.9  --   --   --   HGB 14.3   < > 12.3* 12.7*  HCT 43.4   < > 37.5* 37.7*  MCV 96.7   < > 97.2 93.8  PLT 242   < > 137* 148*   < > = values in this interval not displayed.   Basic Metabolic Panel:  Recent Labs  Lab 01/29/20 0545 01/29/20 0545 01/30/20 0547 01/31/20 0519  NA 139   < > 140 139  K 3.5   < > 4.1 3.4*  CL 109   < > 108 104  CO2 23   < > 25 26  GLUCOSE 125*   < > 138* 139*  BUN 16   < > 15 14  CREATININE 0.97   < > 0.82 0.80  CALCIUM 8.0*   < > 8.5* 8.6*  MG 1.9  --  2.2  --   PHOS 2.8   < > 1.9* 3.2   < > = values in this interval not displayed.   Lipid Panel:     Component Value Date/Time   CHOL 125 01/28/2020 0716   CHOL 226 (H) 12/17/2019 0831   TRIG 298 (H) 01/28/2020 0716   HDL 32 (L) 01/28/2020 0716   HDL 39 (L) 12/17/2019 0831   CHOLHDL 3.9 01/28/2020 0716   VLDL 60 (H) 01/28/2020 0716   LDLCALC 33 01/28/2020 0716   LDLCALC 154 (H) 12/17/2019 0831   HgbA1c:  Lab Results  Component Value Date   HGBA1C 5.6 01/28/2020   Urine Drug Screen: No results found for: LABOPIA, COCAINSCRNUR, LABBENZ, AMPHETMU, THCU, LABBARB  Alcohol Level No results found for: ETH  IMAGING past 24 hours  No results found.  PHYSICAL EXAM  Temp:   [97.6 F (36.4 C)-100.8 F (38.2 C)] 97.6 F (36.4 C) (06/20 1150) Pulse Rate:  [60-96] 96 (06/20 1150) Resp:  [18-29] 18 (06/20 1150) BP: (138-170)/(94-109) 138/94 (06/20 1150) SpO2:  [96 %-100 %] 97 % (06/20 1150)  General - Well nourished, well developed, extubated and tolerating well.  Ophthalmologic - fundi not visualized due to noncooperation.  Cardiovascular - irregularly irregular heart rate and rhythm  Neuro - extubated, awake alert, eyes open, expressive aphasia, nonverbal, not name or repeat. However, able to follow simple central and peripheral commands on the left. Eyes in mid position, EOMI. Able to blink to visual threat bilaterally, PERRL. Right facial droop.  Tongue protrusion midline. Left UE at least 4/5 and against gravity, LLE at least 3+/5. However, on pain stimulation, 2-/5 RUE and  0/5 RLE. DTR diminished and no babinski. Sensation, coordination not cooperative and gait not tested.   ASSESSMENT/PLAN Dustin Schneider is a 64 y.o. male with history of paroxysmal A. fib on chronic Xarelto, nonischemic cardiomyopathy and hypertension presenting with L forced gaze and R hemiplegia with nausea and vomiting. Not a tPA candidate as on xarelto. Taken to IR for L MCA occlusion.  Stroke:   L MCA and ACA scattered infarcts s/p TICI3 revascularization of L MCA & A3, infarct embolic secondary to known AF on Xarelto  Code Stroke CT head Hyperdense L M2. ASPECTS 10.     CTA head & neck L MCA bifurcation occlusion w/ little distal flow. Minimal collateral flow.  Cerebral angio distal L M1 occlusion, distal L A3 occlusion. TICI 3 revascularization MCA and ACA   Post IR CT no hemorrhage  MRI  L basal ganglia infarct w/ mild edema. L cingulate and L superior frontal lobe infarcts w/ mild cortical edema. Few punctate infarcts R cingulate. Petechial hemorrhage at caudate. Small R cavernous malformation.   MRA  L MCA and ACA open.   2D Echo - EF 55 - 60%. No cardiac source of  emboli identified.   LDL 33  HgbA1c - 5.6   SCDs for VTE prophylaxis  Xarelto daily prior to admission, now on aspirin 325 po. Will consider to switch to eliquis in 3-5 days post stroke  Therapy recommendations: CIR  Disposition:  pending   Acute Respiratory Failure Possible Aspiration  N&V in ED  Intubated for IR, left intubated following given possible aspiration  Off sedation  Extubated 6/18  CXR unremarkable  WBC 9.9  CCM on board  Atrial Fibrillation  Home anticoagulation:  Xarelto (rivaroxaban) daily  . Home meds:  metoprolol 100 daily, tikosyn 500 bid . Not an AC at this time given post IR petechial hemorrhage.  . Now on ASA . Will consider to switch to eliquis in 3-5 days.    Hypertension  Home meds:  metoprolol 100 daily, valsartan 320 . BP goal < 160 . Off phenylephrine  . Long-term BP goal normotensive  Hyperlipidemia  Home meds:  crestor 10, resumed in hospital  LDL 33, goal < 70  Continue statin at discharge  Dysphagia . Secondary to stroke . NPO . On cortrak   On TF @ 32 . Speech on board   Other Stroke Risk Factors  Advanced age  NICM w/ EF 45% PTA  (EF 24 - 60% by echo this admission) - on tikosyn PTA - Dr. [name] pt - contacted Dr. [name] office and was told to re-start tikosyn at follow up with Dr. [name] after discharge.   Other Active Problems  Hypokalemia - supplemented - recheck in AM  The plan is for the pt to go to inpatient rehab - CIR. Due to a misunderstanding we thought the pt was going yesterday and a discharge summary was prepared instead of a progress note; however, pt has not yet been accepted. The discharge summary has been deleted.   Hospital day # 4      To contact Stroke Continuity provider, please refer to [email or web address] After hours, contact General Neurology

## 2020-02-01 LAB — BASIC METABOLIC PANEL
Anion gap: 8 (ref 5–15)
BUN: 18 mg/dL (ref 8–23)
CO2: 27 mmol/L (ref 22–32)
Calcium: 8.8 mg/dL — ABNORMAL LOW (ref 8.9–10.3)
Chloride: 106 mmol/L (ref 98–111)
Creatinine, Ser: 0.83 mg/dL (ref 0.61–1.24)
GFR calc Af Amer: >60 mL/min (ref >60)
GFR calc non Af Amer: >60 mL/min (ref >60)
Glucose, Bld: 137 mg/dL — ABNORMAL HIGH (ref 70–99)
Potassium: 3.9 mmol/L (ref 3.5–5.1)
Sodium: 141 mmol/L (ref 135–145)

## 2020-02-01 LAB — GLUCOSE, CAPILLARY
Glucose-Capillary: 102 mg/dL — ABNORMAL HIGH (ref 70–99)
Glucose-Capillary: 114 mg/dL — ABNORMAL HIGH (ref 70–99)
Glucose-Capillary: 125 mg/dL — ABNORMAL HIGH (ref 70–99)
Glucose-Capillary: 138 mg/dL — ABNORMAL HIGH (ref 70–99)
Glucose-Capillary: 147 mg/dL — ABNORMAL HIGH (ref 70–99)
Glucose-Capillary: 150 mg/dL — ABNORMAL HIGH (ref 70–99)

## 2020-02-01 LAB — CBC
HCT: 40.2 % (ref 39.0–52.0)
Hemoglobin: 13.4 g/dL (ref 13.0–17.0)
MCH: 31.7 pg (ref 26.0–34.0)
MCHC: 33.3 g/dL (ref 30.0–36.0)
MCV: 95 fL (ref 80.0–100.0)
Platelets: 187 10*3/uL (ref 150–400)
RBC: 4.23 MIL/uL (ref 4.22–5.81)
RDW: 13.5 % (ref 11.5–15.5)
WBC: 9.2 10*3/uL (ref 4.0–10.5)
nRBC: 0 % (ref 0.0–0.2)

## 2020-02-01 LAB — PHOSPHORUS: Phosphorus: 3.8 mg/dL (ref 2.5–4.6)

## 2020-02-01 NOTE — Progress Notes (Signed)
STROKE TEAM PROGRESS NOTE   INTERVAL HISTORY Patient continues to have significant expressive aphasia and dense hemiplegia.  He is unable to swallow and has core track tube.  Speech therapy will evaluate him today.  Is awaiting bed in rehab.  Vital signs stable  Vitals:   01/31/20 2032 01/31/20 2121 01/31/20 2318 02/01/20 0310  BP: (!) 162/99 (!) 156/92 (!) 154/91 (!) 153/93  Pulse: 72 71 65 66  Resp:   (!) 22 20  Temp:   98.7 F (37.1 C) 99.5 F (37.5 C)  TempSrc:   Oral Oral  SpO2:   95% 96%  Weight:      Height:       CBC:  Recent Labs  Lab 01/27/20 1341 01/27/20 1346 01/31/20 0519 02/01/20 0507  WBC 9.9   < > 8.4 9.2  NEUTROABS 4.9  --   --   --   HGB 14.3   < > 12.7* 13.4  HCT 43.4   < > 37.7* 40.2  MCV 96.7   < > 93.8 95.0  PLT 242   < > 148* 187   < > = values in this interval not displayed.   Basic Metabolic Panel:  Recent Labs  Lab 01/29/20 0545 01/29/20 0545 01/30/20 0547 01/30/20 0547 01/31/20 0519 02/01/20 0507  NA 139   < > 140   < > 139 141  K 3.5   < > 4.1   < > 3.4* 3.9  CL 109   < > 108   < > 104 106  CO2 23   < > 25   < > 26 27  GLUCOSE 125*   < > 138*   < > 139* 137*  BUN 16   < > 15   < > 14 18  CREATININE 0.97   < > 0.82   < > 0.80 0.83  CALCIUM 8.0*   < > 8.5*   < > 8.6* 8.8*  MG 1.9  --  2.2  --   --   --   PHOS 2.8   < > 1.9*   < > 3.2 3.8   < > = values in this interval not displayed.   Lipid Panel:     Component Value Date/Time   CHOL 125 01/28/2020 0716   CHOL 226 (H) 12/17/2019 0831   TRIG 298 (H) 01/28/2020 0716   HDL 32 (L) 01/28/2020 0716   HDL 39 (L) 12/17/2019 0831   CHOLHDL 3.9 01/28/2020 0716   VLDL 60 (H) 01/28/2020 0716   LDLCALC 33 01/28/2020 0716   LDLCALC 154 (H) 12/17/2019 0831   HgbA1c:  Lab Results  Component Value Date   HGBA1C 5.6 01/28/2020   IMAGING past 24 hours No results found.  PHYSICAL EXAM    General - Well nourished, well developed, extubated and tolerating well.  Ophthalmologic - fundi  not visualized due to noncooperation.  Cardiovascular - irregularly irregular heart rate and rhythm  Neuro - extubated, awake alert, eyes open, expressive aphasia, nonverbal, not name or repeat. However, able to follow simple central and peripheral commands on the left. Eyes in mid position, EOMI. Able to blink to visual threat bilaterally, PERRL. Right facial droop.  Tongue protrusion midline. Left UE at least 4/5 and against gravity, LLE at least 3+/5. However, on pain stimulation, 2-/5 RUE and 0/5 RLE. DTR diminished and no babinski. Sensation, coordination not cooperative and gait not tested.   ASSESSMENT/PLAN [name] is a 64 y.o. male with  history of paroxysmal A. fib on chronic Xarelto, nonischemic cardiomyopathy and hypertension presenting with L forced gaze and R hemiplegia with nausea and vomiting. Not a tPA candidate as on xarelto. Taken to IR for L MCA occlusion.  Stroke:   L MCA and ACA scattered infarcts s/p TICI3 revascularization of L MCA & A3, infarct embolic secondary to known AF on Xarelto  Code Stroke CT head Hyperdense L M2. ASPECTS 10.     CTA head & neck L MCA bifurcation occlusion w/ little distal flow. Minimal collateral flow.  Cerebral angio distal L M1 occlusion, distal L A3 occlusion. TICI 3 revascularization MCA and ACA   Post IR CT no hemorrhage  MRI  L basal ganglia infarct w/ mild edema. L cingulate and L superior frontal lobe infarcts w/ mild cortical edema. Few punctate infarcts R cingulate. Petechial hemorrhage at caudate. Small R cavernous malformation.   MRA  L MCA and ACA open.   2D Echo - EF 55 - 60%. No cardiac source of emboli identified.   LDL 33  HgbA1c - 5.6   SCDs for VTE prophylaxis  Xarelto daily prior to admission, now on aspirin 325 po. Plan switch to eliquis   Therapy recommendations: CIR  Disposition:  Pending. Insurance auth for rehab underway  Acute Respiratory Failure Possible Aspiration  N&V in ED  Intubated  for IR, left intubated following given possible aspiration  Off sedation  Extubated 6/18  CXR unremarkable  WBC 9.9  CCM on board  Atrial Fibrillation  Home anticoagulation:  Xarelto (rivaroxaban) daily  . Home meds:  metoprolol 100 daily, tikosyn 500 bid . Not an AC at this time given post IR petechial hemorrhage.  . Now on ASA . Plan switch to eliquis    Hypertension  Home meds:  metoprolol 100 daily, valsartan 320 . BP goal < 160 . Off phenylephrine  . Long-term BP goal normotensive  Hyperlipidemia  Home meds:  crestor 10, resumed in hospital  LDL 33, goal < 70  Continue statin at discharge  Dysphagia . Secondary to stroke . NPO . On cortrak   On TF @ 31 . Speech on board   Other Stroke Risk Factors  Advanced age  NICM w/ EF 45% PTA  (EF 1 - 60% by echo this admission) - on tikosyn PTA - Dr. [name] pt - contacted Dr. [name] office and was told to re-start tikosyn at follow up with Dr. [name] after discharge.   Other Active Problems  Hypokalemia - resolved  Hospital day # 5 Continue core track for tube feeding till patient is able to swallow.  Will consider switching to Eliquis after he can swallow or has a PEG tube.  Continue ongoing therapies and hopefully transfer to rehab in the next few days Antony Contras, MD    To contact Stroke Continuity provider, please refer to [email or web address] After hours, contact General Neurology

## 2020-02-01 NOTE — Progress Notes (Signed)
  Speech Language Pathology Treatment: Dysphagia;Cognitive-Linquistic  Patient Details Name: Dustin Schneider MRN: [id number] DOB: March 28, 1956 Today's Date: 02/01/2020 Time: 1007-1219 SLP Time Calculation (min) (ACUTE ONLY): 45 min  Assessment / Plan / Recommendation Clinical Impression  Pt making beautiful progress today; able to repeat words, from monosyllabic bilabials, progressing to multisyllabic words, with visual, articulatory cues. Uses some total communication methods with cues for distal had gestures. Apraxia is improving. Pt self feed honey, puree and thin. Concern for silent aspriation warrants MBS. Will proceed tomorrow am.   HPI HPI: Pt is a 64 yo male presenting iwth acute onset R sided weakness and aphasia. CT showed hyerdensity in the L M2 branch. Pt underwent emergent L MCA and ACA revascularization on 6/16. He was intubated 6/16-6/18. PMH includes: afib, cardiomyopathy, HTN      SLP Plan          Recommendations  Diet recommendations: NPO                        GO              Dustin Baltimore, MA CCC-SLP  Acute Rehabilitation Services Pager [phone] Office [phone]   Lynann Beaver 02/01/2020, 1:50 PM

## 2020-02-01 NOTE — Progress Notes (Signed)
Physical Therapy Treatment Patient Details Name: Dustin Schneider MRN: [id number] DOB: Nov 19, 1955 Today's Date: 02/01/2020    History of Present Illness 64 y.o. male with history of paroxysmal A. fib on chronic Xarelto, nonischemic cardiomyopathy and hypertension.  Patient was at [employer] hardware today.  Patient was noted to come out of the bathroom and suddenly collapsed by bystanders. On arrival they noted that he had a left forced gaze and right hemiplegia.  Patient was also nauseous and vomiting. of head and neck was immediately obtained and showed a dense left MCA. Pt underwent emergent L MCA and ACA revascularization on 6/16.    PT Comments    Pt agreeable to PT, nods "yes" when asked if he wanted to mobilize OOB. Pt requires max-total assist for bed mobility and transfer OOB, pt limited by R hemiplegia and heavy R lateral leaning. Pt with poor seated balance as well, PT focusing intervention on AP and lateral weight shifting and correction to midline in recliner. Pt responds well to short verbal cues and visual cues of midline for correcting posture. VSS during session. PT continuing to recommend CIR, will continue to follow acutely.   Follow Up Recommendations  CIR     Equipment Recommendations  Wheelchair (measurements PT);Wheelchair cushion (measurements PT);Hospital bed    Recommendations for Other Services       Precautions / Restrictions Precautions Precautions: Fall Restrictions Weight Bearing Restrictions: No    Mobility  Bed Mobility Overal bed mobility: Needs Assistance Bed Mobility: Supine to Sit     Supine to sit: Max assist;+2 for physical assistance;+2 for safety/equipment;HOB elevated     General bed mobility comments: Max +2 for LE and truncal management, scooting to EOB with use of bed pads and lateral weight shifting. R lateral leaning, aided by pelvic facilitation and HOB elevation as barrier from falling R (pt sitting on R side of  bed).  Transfers Overall transfer level: Needs assistance Equipment used: 2 person hand held assist Transfers: Sit to/from [employer] Sit to Stand: Max assist;+2 physical assistance;+2 safety/equipment;From elevated surface   Squat pivot transfers: Max assist;+2 physical assistance;+2 safety/equipment;From elevated surface     General transfer comment: Max +2 for sit to stand for trunk elevation, R knee blocking, and upright posture once standing. Pt attempting to step when pt very unsteady, aborted stand pivot and opted for swing pivot to drop arm recliner towards L. Max +2 for squat pivot for body translation to chair, steadying, safe slow eccentric lower into chair, and scooting back in recliner with use of bed pads once seated.  Ambulation/Gait             General Gait Details: unable this day   Stairs             Wheelchair Mobility    Modified Rankin (Stroke Patients Only) Modified Rankin (Stroke Patients Only) Pre-Morbid Rankin Score: No symptoms Modified Rankin: Severe disability     Balance Overall balance assessment: Needs assistance Sitting-balance support: Single extremity supported;Feet supported Sitting balance-Leahy Scale: Poor Sitting balance - Comments: R lateral leaning, require posterior truncal assist and pelvic facilitation for postural correction. Pt responds well to visual cuing of midline to correct own posture as well. Postural control: Right lateral lean Standing balance support: Bilateral upper extremity supported Standing balance-Leahy Scale: Zero Standing balance comment: maxA to maintain standing due to strong R lateral lean  Cognition Arousal/Alertness: Awake/alert Behavior During Therapy: Flat affect Overall Cognitive Status: Difficult to assess                                 General Comments: responds to yes/no questions well and consistently. Pt appears  animated with expressions during session, and periods of laughing noted. Pt attempted to speak to PT multiple times during session, unable.      Exercises Other Exercises Other Exercises: Dynamic seated balance: R and L lateral leaning - x5 each direction, propping on eblow in recliner and righting posture to midline; mod assist to right to midline from R lateral lean. A to P leaning x5    General Comments        Pertinent Vitals/Pain Pain Assessment: No/denies pain Faces Pain Scale: No hurt    Home Living                      Prior Function            PT Goals (current goals can now be found in the care plan section) Acute Rehab PT Goals Patient Stated Goal: to be able to sculpt again  PT Goal Formulation: With patient Time For Goal Achievement: 02/12/20 Potential to Achieve Goals: Fair Progress towards PT goals: Progressing toward goals    Frequency    Min 4X/week      PT Plan Current plan remains appropriate    Co-evaluation              AM-PAC PT "6 Clicks" Mobility   Outcome Measure  Help needed turning from your back to your side while in a flat bed without using bedrails?: A Lot Help needed moving from lying on your back to sitting on the side of a flat bed without using bedrails?: Total Help needed moving to and from a bed to a chair (including a wheelchair)?: Total Help needed standing up from a chair using your arms (e.g., wheelchair or bedside chair)?: Total Help needed to walk in hospital room?: Total Help needed climbing 3-5 steps with a railing? : Total 6 Click Score: 7    End of Session Equipment Utilized During Treatment: Gait belt;Oxygen (O2 reapplied after session) Activity Tolerance: Patient tolerated treatment well Patient left: in chair;with chair alarm set;with call bell/phone within reach;with family/visitor present Nurse Communication: Mobility status;Other (comment) (transfer back to bed with scoot pivot drop arm recliner  +2 towards L) PT Visit Diagnosis: Other abnormalities of gait and mobility (R26.89);Muscle weakness (generalized) (M62.81);Other symptoms and signs involving the nervous system (R29.898)     Time: 1400-1435 PT Time Calculation (min) (ACUTE ONLY): 35 min  Charges:  $Therapeutic Activity: 8-22 mins $Neuromuscular Re-education: 8-22 mins                     Ahmaad Neidhardt E, PT Acute Rehabilitation Services Pager [phone]  Office [phone]   Trenden Hazelrigg D Quinlan Mcfall 02/01/2020, 4:09 PM

## 2020-02-01 NOTE — Progress Notes (Signed)
Inpatient Rehabilitation Admissions Coordinator  I met at bedside with patient and his significant other, Dustin Schneider. We discussed goals and expectations of a possible inpt rehab admit. We discussed the need for caregiver support after a CIR admit which she and family will have to arrange. Dustin Schneider and patient prefer [facility] for his rehab at this time. She will let me know if family prefers other inpt rehab as soon as possible. I will follow up later today with son, Dustin Schneider when he arrives. Current visitor policy reviewed with [name] to include 2 visitors for his rehab stay, not to be rotated amongst support individuals. I will follow up with son and when I hear back form [employer]. I have begun authorization with [employer] today.  Danne Baxter, RN, MSN Rehab Admissions Coordinator [phone] 02/01/2020 11:54 AM

## 2020-02-02 ENCOUNTER — Encounter (HOSPITAL_COMMUNITY): Payer: Self-pay | Admitting: Student in an Organized Health Care Education/Training Program

## 2020-02-02 ENCOUNTER — Encounter (HOSPITAL_COMMUNITY): Payer: Self-pay | Admitting: Physical Medicine & Rehabilitation

## 2020-02-02 ENCOUNTER — Inpatient Hospital Stay (HOSPITAL_COMMUNITY)
Admission: RE | Admit: 2020-02-02 | Discharge: 2020-03-02 | DRG: 057 | Disposition: A | Discharge status: Home / Self Care | Payer: BC Managed Care – PPO | Source: Intra-hospital | Attending: Physical Medicine & Rehabilitation | Admitting: Physical Medicine & Rehabilitation

## 2020-02-02 ENCOUNTER — Inpatient Hospital Stay (HOSPITAL_COMMUNITY): Payer: BC Managed Care – PPO

## 2020-02-02 ENCOUNTER — Other Ambulatory Visit: Payer: Self-pay

## 2020-02-02 DIAGNOSIS — E785 Hyperlipidemia, unspecified: Secondary | ICD-10-CM | POA: Diagnosis present

## 2020-02-02 DIAGNOSIS — K5900 Constipation, unspecified: Secondary | ICD-10-CM | POA: Diagnosis not present

## 2020-02-02 DIAGNOSIS — I69391 Dysphagia following cerebral infarction: Secondary | ICD-10-CM

## 2020-02-02 DIAGNOSIS — Z8679 Personal history of other diseases of the circulatory system: Secondary | ICD-10-CM

## 2020-02-02 DIAGNOSIS — D72829 Elevated white blood cell count, unspecified: Secondary | ICD-10-CM | POA: Diagnosis present

## 2020-02-02 DIAGNOSIS — Z79899 Other long term (current) drug therapy: Secondary | ICD-10-CM | POA: Diagnosis not present

## 2020-02-02 DIAGNOSIS — R739 Hyperglycemia, unspecified: Secondary | ICD-10-CM | POA: Diagnosis not present

## 2020-02-02 DIAGNOSIS — G8110 Spastic hemiplegia affecting unspecified side: Secondary | ICD-10-CM

## 2020-02-02 DIAGNOSIS — R7301 Impaired fasting glucose: Secondary | ICD-10-CM | POA: Diagnosis present

## 2020-02-02 DIAGNOSIS — J690 Pneumonitis due to inhalation of food and vomit: Secondary | ICD-10-CM | POA: Diagnosis present

## 2020-02-02 DIAGNOSIS — I959 Hypotension, unspecified: Secondary | ICD-10-CM | POA: Diagnosis not present

## 2020-02-02 DIAGNOSIS — R4701 Aphasia: Secondary | ICD-10-CM | POA: Diagnosis not present

## 2020-02-02 DIAGNOSIS — I952 Hypotension due to drugs: Secondary | ICD-10-CM

## 2020-02-02 DIAGNOSIS — I69392 Facial weakness following cerebral infarction: Secondary | ICD-10-CM | POA: Diagnosis not present

## 2020-02-02 DIAGNOSIS — R339 Retention of urine, unspecified: Secondary | ICD-10-CM | POA: Diagnosis not present

## 2020-02-02 DIAGNOSIS — R252 Cramp and spasm: Secondary | ICD-10-CM | POA: Diagnosis not present

## 2020-02-02 DIAGNOSIS — R1310 Dysphagia, unspecified: Secondary | ICD-10-CM | POA: Diagnosis present

## 2020-02-02 DIAGNOSIS — I63512 Cerebral infarction due to unspecified occlusion or stenosis of left middle cerebral artery: Secondary | ICD-10-CM | POA: Diagnosis not present

## 2020-02-02 DIAGNOSIS — I69319 Unspecified symptoms and signs involving cognitive functions following cerebral infarction: Secondary | ICD-10-CM

## 2020-02-02 DIAGNOSIS — E46 Unspecified protein-calorie malnutrition: Secondary | ICD-10-CM | POA: Diagnosis present

## 2020-02-02 DIAGNOSIS — I69351 Hemiplegia and hemiparesis following cerebral infarction affecting right dominant side: Secondary | ICD-10-CM | POA: Diagnosis present

## 2020-02-02 DIAGNOSIS — E8809 Other disorders of plasma-protein metabolism, not elsewhere classified: Secondary | ICD-10-CM

## 2020-02-02 DIAGNOSIS — R030 Elevated blood-pressure reading, without diagnosis of hypertension: Secondary | ICD-10-CM

## 2020-02-02 DIAGNOSIS — I10 Essential (primary) hypertension: Secondary | ICD-10-CM | POA: Diagnosis present

## 2020-02-02 DIAGNOSIS — I69320 Aphasia following cerebral infarction: Secondary | ICD-10-CM | POA: Diagnosis not present

## 2020-02-02 DIAGNOSIS — R001 Bradycardia, unspecified: Secondary | ICD-10-CM | POA: Diagnosis not present

## 2020-02-02 DIAGNOSIS — N179 Acute kidney failure, unspecified: Secondary | ICD-10-CM | POA: Diagnosis present

## 2020-02-02 DIAGNOSIS — I480 Paroxysmal atrial fibrillation: Secondary | ICD-10-CM | POA: Diagnosis present

## 2020-02-02 DIAGNOSIS — I69390 Apraxia following cerebral infarction: Secondary | ICD-10-CM | POA: Diagnosis not present

## 2020-02-02 DIAGNOSIS — R609 Edema, unspecified: Secondary | ICD-10-CM

## 2020-02-02 DIAGNOSIS — I428 Other cardiomyopathies: Secondary | ICD-10-CM | POA: Diagnosis present

## 2020-02-02 DIAGNOSIS — Z7901 Long term (current) use of anticoagulants: Secondary | ICD-10-CM | POA: Diagnosis not present

## 2020-02-02 LAB — GLUCOSE, CAPILLARY
Glucose-Capillary: 114 mg/dL — ABNORMAL HIGH (ref 70–99)
Glucose-Capillary: 133 mg/dL — ABNORMAL HIGH (ref 70–99)
Glucose-Capillary: 137 mg/dL — ABNORMAL HIGH (ref 70–99)
Glucose-Capillary: 171 mg/dL — ABNORMAL HIGH (ref 70–99)
Glucose-Capillary: 112 mg/dL — ABNORMAL HIGH (ref 70–99)
Glucose-Capillary: 170 mg/dL — ABNORMAL HIGH (ref 70–99)

## 2020-02-02 LAB — BASIC METABOLIC PANEL
Anion gap: 9 (ref 5–15)
BUN: 24 mg/dL — ABNORMAL HIGH (ref 8–23)
CO2: 30 mmol/L (ref 22–32)
Calcium: 9.1 mg/dL (ref 8.9–10.3)
Chloride: 103 mmol/L (ref 98–111)
Creatinine, Ser: 1.02 mg/dL (ref 0.61–1.24)
GFR calc Af Amer: >60 mL/min (ref >60)
GFR calc non Af Amer: >60 mL/min (ref >60)
Glucose, Bld: 115 mg/dL — ABNORMAL HIGH (ref 70–99)
Potassium: 3.8 mmol/L (ref 3.5–5.1)
Sodium: 142 mmol/L (ref 135–145)

## 2020-02-02 LAB — CBC
HCT: 42.5 % (ref 39.0–52.0)
Hemoglobin: 13.8 g/dL (ref 13.0–17.0)
MCH: 31.8 pg (ref 26.0–34.0)
MCHC: 32.5 g/dL (ref 30.0–36.0)
MCV: 97.9 fL (ref 80.0–100.0)
Platelets: 206 10*3/uL (ref 150–400)
RBC: 4.34 MIL/uL (ref 4.22–5.81)
RDW: 13.8 % (ref 11.5–15.5)
WBC: 9.3 10*3/uL (ref 4.0–10.5)
nRBC: 0 % (ref 0.0–0.2)

## 2020-02-02 MED ORDER — DIPHENHYDRAMINE HCL 12.5 MG/5ML PO ELIX: 12.5000 mg | ORAL_SOLUTION | Freq: Four times a day (QID) | ORAL | Status: DC | PRN

## 2020-02-02 MED ORDER — RESOURCE THICKENUP CLEAR PO POWD
1.0000 | ORAL | Status: DC | PRN
  Filled 2020-02-02: qty 125

## 2020-02-02 MED ORDER — PRO-STAT SUGAR FREE PO LIQD: 30.0000 mL | Freq: Two times a day (BID) | ORAL | 0 refills | Status: DC

## 2020-02-02 MED ORDER — BISACODYL 10 MG RE SUPP: 10.0000 mg | Freq: Every day | RECTAL | 0 refills | Status: DC | PRN

## 2020-02-02 MED ORDER — ALUM & MAG HYDROXIDE-SIMETH 200-200-20 MG/5ML PO SUSP
30.0000 mL | ORAL | Status: DC | PRN
  Administered 2020-02-14: 30 mL via ORAL
  Filled 2020-02-02: qty 30

## 2020-02-02 MED ORDER — APIXABAN 5 MG PO TABS
5.0000 mg | ORAL_TABLET | Freq: Two times a day (BID) | ORAL | Status: DC
Start: 2020-02-02 — End: 2020-03-02

## 2020-02-02 MED ORDER — APIXABAN 5 MG PO TABS
5.0000 mg | ORAL_TABLET | Freq: Two times a day (BID) | ORAL | Status: DC
  Administered 2020-02-02 – 2020-03-02 (×58): 5 mg via ORAL
  Filled 2020-02-02 (×58): qty 1

## 2020-02-02 MED ORDER — PRO-STAT SUGAR FREE PO LIQD
30.0000 mL | Freq: Two times a day (BID) | ORAL | Status: DC
  Administered 2020-02-02 – 2020-02-03 (×2): 30 mL
  Filled 2020-02-02 (×2): qty 30

## 2020-02-02 MED ORDER — BISACODYL 10 MG RE SUPP: 10.0000 mg | Freq: Every day | RECTAL | Status: DC | PRN

## 2020-02-02 MED ORDER — OSMOLITE 1.5 CAL PO LIQD: 1000.0000 mL | ORAL | 0 refills | Status: DC

## 2020-02-02 MED ORDER — CHLORHEXIDINE GLUCONATE 0.12% ORAL RINSE (MEDLINE KIT): 15.0000 mL | Freq: Two times a day (BID) | OROMUCOSAL | 0 refills | Status: DC

## 2020-02-02 MED ORDER — ORAL CARE MOUTH RINSE: 15.0000 mL | Freq: Two times a day (BID) | OROMUCOSAL | 0 refills | Status: DC

## 2020-02-02 MED ORDER — ROSUVASTATIN CALCIUM 5 MG PO TABS
10.0000 mg | ORAL_TABLET | Freq: Every day | ORAL | Status: DC
  Administered 2020-02-03: 10 mg
  Filled 2020-02-02: qty 2

## 2020-02-02 MED ORDER — METOPROLOL TARTRATE 25 MG/10 ML ORAL SUSPENSION
100.0000 mg | Freq: Two times a day (BID) | ORAL | Status: DC
  Administered 2020-02-02 – 2020-02-03 (×2): 100 mg
  Filled 2020-02-02 (×3): qty 40

## 2020-02-02 MED ORDER — ROSUVASTATIN CALCIUM 10 MG PO TABS: 10.0000 mg | ORAL_TABLET | Freq: Every day | ORAL | Status: DC

## 2020-02-02 MED ORDER — SENNOSIDES-DOCUSATE SODIUM 8.6-50 MG PO TABS
1.0000 | ORAL_TABLET | Freq: Every evening | ORAL | Status: DC | PRN
  Administered 2020-02-06: 1 via ORAL
  Filled 2020-02-02: qty 1

## 2020-02-02 MED ORDER — FLEET ENEMA 7-19 GM/118ML RE ENEM: 1.0000 | ENEMA | Freq: Once | RECTAL | Status: DC | PRN

## 2020-02-02 MED ORDER — CHLORHEXIDINE GLUCONATE CLOTH 2 % EX PADS: 6.0000 | MEDICATED_PAD | Freq: Every day | CUTANEOUS | Status: DC

## 2020-02-02 MED ORDER — GUAIFENESIN-DM 100-10 MG/5ML PO SYRP: 5.0000 mL | ORAL_SOLUTION | Freq: Four times a day (QID) | ORAL | Status: DC | PRN

## 2020-02-02 MED ORDER — PROCHLORPERAZINE 25 MG RE SUPP: 12.5000 mg | Freq: Four times a day (QID) | RECTAL | Status: DC | PRN

## 2020-02-02 MED ORDER — METOPROLOL TARTRATE 25 MG/10 ML ORAL SUSPENSION: 100.0000 mg | Freq: Two times a day (BID) | ORAL | Status: DC

## 2020-02-02 MED ORDER — RESOURCE THICKENUP CLEAR PO POWD
ORAL | Status: DC | PRN
  Filled 2020-02-02: qty 125

## 2020-02-02 MED ORDER — IRBESARTAN 75 MG PO TABS
150.0000 mg | ORAL_TABLET | Freq: Every day | ORAL | Status: DC
  Administered 2020-02-03: 150 mg
  Filled 2020-02-02: qty 2

## 2020-02-02 MED ORDER — FREE WATER: 30.0000 mL | Status: DC

## 2020-02-02 MED ORDER — PROCHLORPERAZINE MALEATE 5 MG PO TABS: 5.0000 mg | ORAL_TABLET | Freq: Four times a day (QID) | ORAL | Status: DC | PRN

## 2020-02-02 MED ORDER — ORAL CARE MOUTH RINSE
15.0000 mL | Freq: Two times a day (BID) | OROMUCOSAL | Status: DC
  Administered 2020-02-02 – 2020-02-20 (×27): 15 mL via OROMUCOSAL

## 2020-02-02 MED ORDER — ACETAMINOPHEN 325 MG PO TABS
325.0000 mg | ORAL_TABLET | ORAL | Status: DC | PRN
  Administered 2020-02-04 – 2020-02-15 (×4): 650 mg via ORAL
  Filled 2020-02-02 (×3): qty 2

## 2020-02-02 MED ORDER — IRBESARTAN 150 MG PO TABS: 150.0000 mg | ORAL_TABLET | Freq: Every day | ORAL | Status: DC

## 2020-02-02 MED ORDER — PROCHLORPERAZINE EDISYLATE 10 MG/2ML IJ SOLN: 5.0000 mg | Freq: Four times a day (QID) | INTRAMUSCULAR | Status: DC | PRN

## 2020-02-02 MED ORDER — RESOURCE THICKENUP CLEAR PO POWD: 1.0000 | ORAL | Status: DC | PRN

## 2020-02-02 MED ORDER — TRAZODONE HCL 50 MG PO TABS
25.0000 mg | ORAL_TABLET | Freq: Every evening | ORAL | Status: DC | PRN
  Administered 2020-02-04 – 2020-02-10 (×3): 25 mg via ORAL
  Filled 2020-02-02 (×3): qty 1

## 2020-02-02 MED ORDER — SENNOSIDES-DOCUSATE SODIUM 8.6-50 MG PO TABS: 1.0000 | ORAL_TABLET | Freq: Every evening | ORAL | Status: DC | PRN

## 2020-02-02 NOTE — H&P (Signed)
Physical Medicine and Rehabilitation Admission H&P    CC: Stroke with functional deficits  HPI: Dustin Schneider is a 64 year old male with history of PAF- on Xarelto, NICM; who was admitted on 01/27/20 after collapsing at [facility]. He was found to have right sided weakness with left gaze preference as well as N/V. CT head done at admission revealing hyperdense sign left M2 branch c/w acute thrombus. He underwent cerebral angio with complete recanalization of L-MCA and ACA. 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  2D echo showed EF 55-60% with moderate biatrial dilatation, mild MVR and grade  2 DD.   He tolerated extubation on 06/18 and made NPO due to facial weakness with signs of dysphagia and risk for silent aspiration. Patient with dense right hemiplegia with aphasia. Cortak placed and he was started on tube feeds.   He was maintained on ASA given IR petechial hemorrhage and transitioned to Eliquis 06/22. He continues to be limited by right sided weakness with right lean, apraxia with limited verbal output and needs multimodal cues to follow simple commands. CIR recommended due to functional deficits.     Review of Systems  Unable to perform ROS: Patient nonverbal      Past Medical History:  Diagnosis Date  . Hypertension   . NICM (nonischemic cardiomyopathy) ([place]) 06/15/2018   04/23/18: EF 45%, 09/15/18: NOrmal LVEF  . Paroxysmal atrial fibrillation ([place]) 06/15/2018  . Visit for monitoring Tikosyn therapy 06/12/2018    Past Surgical History:  Procedure Laterality Date  . CARDIOVERSION N/A 05/13/2018   Procedure: CARDIOVERSION;  Surgeon: [name], MD;  Location: [facility] ENDOSCOPY;  Service: Cardiovascular;  Laterality: N/A;  . CARDIOVERSION N/A  06/13/2018   Procedure: CARDIOVERSION;  Surgeon: [name], MD;  Location: [facility] ENDOSCOPY;  Service: Cardiovascular;  Laterality: N/A;  . CARDIOVERSION N/A 06/23/2018   Procedure: CARDIOVERSION;  Surgeon: [name], MD;  Location: [place];  Service: Cardiovascular;  Laterality: N/A;  . excision of actinic keratosis    . EYE SURGERY     eye lift  . IR CT HEAD LTD  01/27/2020  . IR PERCUTANEOUS ART THROMBECTOMY/INFUSION INTRACRANIAL INC DIAG ANGIO  01/27/2020      . IR PERCUTANEOUS ART THROMBECTOMY/INFUSION INTRACRANIAL INC DIAG ANGIO  01/27/2020  . RADIOLOGY WITH ANESTHESIA N/A 01/27/2020   Procedure: IR WITH ANESTHESIA;  Surgeon: Radiologist, Medication, MD;  Location: [facility];  Service: Radiology;  Laterality: N/A;  . TONSILLECTOMY    . WISDOM TOOTH EXTRACTION      Family History  Problem Relation Age of Onset  . Glaucoma Mother   . Atrial fibrillation Father     Social History:  Per reports that he has never smoked. He has never used smokeless tobacco. Per reports current alcohol use. Per  Reports he does not use drugs.    Allergies: No Known Allergies    Medications Prior to Admission  Medication Sig Dispense Refill  . Amino Acids-Protein Hydrolys (FEEDING SUPPLEMENT, PRO-STAT SUGAR FREE 64,) LIQD Place 30 mLs into feeding tube 2 (two) times daily. 887 mL 0  . apixaban (ELIQUIS) 5 MG TABS tablet Take 1 tablet (5 mg total) by mouth 2 (two) times daily. 60 tablet   . bisacodyl (DULCOLAX) 10 MG suppository Place 1 suppository (10 mg total) rectally daily as needed  for moderate constipation. 12 suppository 0  . [START ON 02/03/2020] Chlorhexidine Gluconate Cloth 2 % PADS Apply 6 each topically daily.    . chlorhexidine gluconate, MEDLINE KIT, (PERIDEX) 0.12 % solution 15 mLs by Mouth Rinse route 2 (two) times daily. 120 mL 0  . [START ON 02/03/2020] irbesartan (AVAPRO) 150 MG tablet Place 1 tablet (150 mg total) into feeding tube daily.    . Maltodextrin-Xanthan Gum (RESOURCE  THICKENUP CLEAR) POWD Take 120 g by mouth as needed (nectar thick liquids).    . metoprolol tartrate (LOPRESSOR) 25 mg/10 mL SUSP Place 40 mLs (100 mg total) into feeding tube 2 (two) times daily.    . Mouthwashes (MOUTH RINSE) LIQD solution 15 mLs by Mouth Rinse route 2 times daily at 12 noon and 4 pm.  0  . Nutritional Supplements (FEEDING SUPPLEMENT, OSMOLITE 1.5 CAL,) LIQD Place 1,000 mLs into feeding tube continuous.  0  . [START ON 02/03/2020] rosuvastatin (CRESTOR) 10 MG tablet Place 1 tablet (10 mg total) into feeding tube daily.    Marland Kitchen senna-docusate (SENOKOT-S) 8.6-50 MG tablet Take 1 tablet by mouth at bedtime as needed for mild constipation.    . Water For Irrigation, Sterile (FREE WATER) SOLN Place 30 mLs into feeding tube every 4 (four) hours.      Drug Regimen Review  Drug regimen was reviewed and remains appropriate with no significant issues identified   Blood pressure (!) 158/95, pulse 69, temperature 98 F (36.7 C), temperature source Oral, resp. rate 20, height _0  (1.753 m), weight 80.2 kg, SpO2 95 %.  General:  Easily frustrated HEENT: Head is normocephalic, atraumatic, PERRLA, EOMI, sclera anicteric, oral mucosa pink and moist, dentition intact, ext ear canals clear,  +Cortrak. Neck: Supple without JVD or lymphadenopathy Heart: Irregularly irregular R&R. No murmurs rubs or gallops Chest: CTA bilaterally without wheezes, rales, or rhonchi; no distress Abdomen: Soft, non-tender, non-distended, bowel sounds positive. Extremities: No clubbing, cyanosis, or edema. Pulses are 2+ Skin: Clean and intact without signs of breakdown Neuro: Can respond yes and no inconsistently. Expressive> receptive aphasia. Difficulty confirming or negating basic biographical questions. He was able to point to the window and TV. R facial droop. Dense right hemiplegia. RLE 0/5. RUE appears to be 1/5. LUE appears to be 4/5. LLE appears to be at at least 3/5.  Psych: [name] affect is appropriate. Pt  is cooperative. Frustrated by his aphasia.    Results for orders placed or performed during the hospital encounter of 02/02/20 (from the past 48 hour(s))  Glucose, capillary     Status: Abnormal   Collection Time: 02/02/20  4:30 PM  Result Value Ref Range   Glucose-Capillary 112 (H) 70 - 99 mg/dL    Comment: Glucose reference range applies only to samples taken after fasting for at least 8 hours.   No results found.     Medical Problem List and Plan: 1.  Impaired mobility and ADLs secondary to left MCA ischemic stroke  -patient may shower  -ELOS/Goals: 20-22 days S- minA PT, OT, SLP 2.  Antithrombotics: -DVT/anticoagulation:  Pharmaceutical: Other (comment)--Eliquis  -antiplatelet therapy: N/A 3. Pain Management: Tylenol prn.  4. Mood: LCSW to follow for evaluation and support.   -antipsychotic agents: N/A 5. Neuropsych: This patient is notcapable of making decisions on his own behalf. 6. Skin/Wound Care: N/A 7. Fluids/Electrolytes/Nutrition: Started on diet today--will hold tube feeds and order calorie count. Continue water flushes. Cortrak remains in.  8. PAF: Monitor HR tid--has been off Tikosyn since  admission. Continue metoprolol bid with Eliquis.  9. HTN: Monitor BP tid--managed on metoprolol and Avapro. Has been elevated.  10. Dyslipidemia: Continue Crestor. 11. Dysphagia: Now on dysphagia 2, nectar liquids with chin tuck. Hold tube feeds and start calorie count. Continue water flushes for now. Monitor I/O.  12. Impaired fasting glucose.  13. Nonischemic cardiomyopathy: outpatient cardiology follow-up.  Reesa Chew, PA-C  I have personally performed a face to face diagnostic evaluation, including, but not limited to relevant history and physical exam findings, of this patient and developed relevant assessment and plan.  Additionally, I have reviewed and concur with the physician assistant's documentation above.  The patient's status has not changed. The original post  admission physician evaluation remains appropriate, and any changes from the pre-admission screening or documentation from the acute chart are noted above.   Leeroy Cha, MD

## 2020-02-02 NOTE — Progress Notes (Signed)
Izora Ribas, MD  Physician  Physical Medicine and Rehabilitation  Consult Note     Signed  Date of Service:  01/30/2020  8:19 PM      Related encounter: ED to Hosp-Admission (Discharged) from 01/27/2020 in [facility] [facility]      Signed      Expand All Collapse All  Show:Clear all [x] Manual[x] Template[] Copied  Added by: [x] Raulkar, Clide Deutscher, MD  [] Hover for details          Physical Medicine and Rehabilitation Consult Reason for Consult: Impaired mobility and ADLs following acute ischemic stroke. Referring Physician: Rosalin Hawking, MD     HPI: Dustin Schneider is a 64 y.o. male with a history of paroxysmal atrial fibrillation on chronic Xarelto, nonischemic cardiomyopathy, and HTN. Patient was at [place] and was noted to collapse when he came out of the bathroom. He had a forced left gaze and right sided hemiplegia. He was also nauseous and vomiting. Imagine showed dense left MCA stroke. Patient underwent emergent L MCA and ACA revascularization on 6/16.    He has been tolerating therapy well and demonstrating improving activity tolerance.    ROS: Unable to obtain due to aphasia           Past Medical History:  Diagnosis Date  . Hypertension    . NICM (nonischemic cardiomyopathy) ([place]) 06/15/2018    04/23/18: EF 45%, 09/15/18: NOrmal LVEF  . Paroxysmal atrial fibrillation ([place]) 06/15/2018  . Visit for monitoring Tikosyn therapy 06/12/2018         Past Surgical History:  Procedure Laterality Date  . CARDIOVERSION N/A 05/13/2018    Procedure: CARDIOVERSION;  Surgeon: [name], MD;  Location: [facility] ENDOSCOPY;  Service: Cardiovascular;  Laterality: N/A;  . CARDIOVERSION N/A 06/13/2018    Procedure: CARDIOVERSION;  Surgeon: [name], MD;  Location: [facility] ENDOSCOPY;  Service: Cardiovascular;  Laterality: N/A;  . CARDIOVERSION N/A 06/23/2018    Procedure: CARDIOVERSION;  Surgeon: [name], MD;  Location: [place];  Service: Cardiovascular;   Laterality: N/A;  . excision of actinic keratosis      . EYE SURGERY        eye lift  . IR PERCUTANEOUS ART THROMBECTOMY/INFUSION INTRACRANIAL INC DIAG ANGIO   01/27/2020       . RADIOLOGY WITH ANESTHESIA N/A 01/27/2020    Procedure: IR WITH ANESTHESIA;  Surgeon: Radiologist, Medication, MD;  Location: [place];  Service: Radiology;  Laterality: N/A;  . TONSILLECTOMY      . WISDOM TOOTH EXTRACTION             Family History  Problem Relation Age of Onset  . Glaucoma Mother    . Atrial fibrillation Father      Social History:  reports that he has never smoked. He has never used smokeless tobacco. He reports current alcohol use. He reports that he does not use drugs. Allergies: No Known Allergies       Medications Prior to Admission  Medication Sig Dispense Refill  . dofetilide (TIKOSYN) 500 MCG capsule Take 1 capsule (500 mcg total) by mouth 2 (two) times daily. 180 capsule 3  . MAGNESIUM CITRATE PO Take 250 mg by mouth in the morning and at bedtime.       . metoprolol succinate (TOPROL-XL) 100 MG 24 hr tablet TAKE 1 TABLET BY MOUTH TWICE DAILY. (Patient taking differently: Take 100 mg by mouth 2 (two) times daily. ) 180 tablet 1  . Potassium 99 MG TABS Take 99  mg by mouth daily.      . rosuvastatin (CRESTOR) 10 MG tablet Take 1 tablet (10 mg total) by mouth daily. 90 tablet 3  . valsartan (DIOVAN) 320 MG tablet TAKE ONE TABLET AT BEDTIME. (Patient taking differently: Take 320 mg by mouth at bedtime. ) 90 tablet 1  . XARELTO 20 MG TABS tablet TAKE ONE TABLET IN THE EVENING AFTER DINNER. (Patient taking differently: Take 20 mg by mouth daily after supper. 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 [place] Functional Status:  Mobility: Bed Mobility Overal bed mobility: Needs Assistance Bed Mobility: Supine to Sit Supine to sit: Max assist, HOB elevated General bed mobility comments: pt with improved mobility, needing assistance for RLE, assist to turn hips although pt utilizing railing with LUE to help, and assistance to elevate trunk Transfers Overall transfer level: Needs assistance Equipment used: 1 person hand held assist Transfers: Sit to/from Stand, Stand Pivot Transfers Sit to Stand: Max assist Stand pivot transfers: Max assist General transfer comment: pt continues to push and lean toward R side during standing, PT works on weight shifting and pt is able to actively shift toward left side with PT verbal and tactile cues   ADL: ADL Overall ADL's : Needs assistance/impaired Eating/Feeding: NPO Grooming: Wash/dry hands, Wash/dry face, Oral care, Brushing hair, Moderate assistance, Sitting Grooming Details (indicate cue type and reason): Pt using yaunker mod I while in bed  Upper Body Bathing: Maximal assistance, Sitting Lower Body Bathing: Maximal assistance, Sit to/from stand Upper Body Dressing : Total assistance, Sitting Lower Body Dressing: Total assistance, Sit to/from stand Toilet Transfer: Maximal assistance, +2 for safety/equipment, +2 for physical assistance, Stand-pivot, BSC Toileting- Clothing Manipulation and Hygiene: Total assistance, Sit to/from stand Functional mobility during ADLs: Maximal assistance, +2 for physical assistance, +2 for safety/equipment   Cognition: Cognition Overall Cognitive Status: Difficult to assess Arousal/Alertness: Awake/alert Orientation Level:  Other (comment) Attention: Sustained Sustained Attention: Appears intact (during simple, functional tasks) Cognition Arousal/Alertness: Awake/alert Behavior During Therapy: Flat affect Overall Cognitive Status: Difficult to assess General Comments: pt follows commands consistently this session with increased time, difficult to assess communication further due to communication deficits Difficult to assess due to: Impaired communication   Blood pressure (!) 164/99, pulse 77, temperature (!) 100.8 F (38.2 C), temperature source Oral, resp. rate (!) 28, height 5\' 9"  (1.753 m), weight 84 kg, SpO2 100 %. Physical Exam    General: Alert, No apparent distress HEENT: Head is normocephalic, atraumatic, PERRLA, EOMI, sclera anicteric, oral mucosa pink and moist, dentition intact, ext ear canals clear,  Neck: Supple without JVD or lymphadenopathy Heart: Irregularly irregular heart rate and rhythm. No murmurs rubs or gallops Chest: CTA bilaterally without wheezes, rales, or rhonchi; no distress Abdomen: Soft, non-tender, non-distended, bowel sounds positive. Extremities: No clubbing, cyanosis, or edema. Pulses are 2+ Skin: Clean and intact without signs of breakdown Neuro: Opens eyes to verbal stimuli. Follows simple commands. Fine motor coordination is intact. No tremors.  LUE 4/5 LLE 4-/5 1/5 RUE Flaccid RLE  Severe expressive >receptive aphasia Psych: [name] affect is appropriate. Pt is cooperative     Lab Results Last 24 Hours       Results for orders placed or performed during the hospital encounter of 01/27/20 (from the past 24 hour(s))  CBC     Status: Abnormal    Collection Time: 01/30/20  5:47 AM  Result Value Ref Range    WBC 8.9 4.0 - 10.5 K/uL    RBC 3.86 (L) 4.22 - 5.81 MIL/uL    Hemoglobin 12.3 (L) 13.0 - 17.0 g/dL    HCT 37.5 (L) 39 - 52 %    MCV 97.2 80.0 - 100.0 fL    MCH 31.9 26.0 - 34.0 pg    MCHC 32.8 30.0 - 36.0 g/dL    RDW 13.6 11.5 - 15.5 %    Platelets 137 (L)  150 - 400 K/uL    nRBC 0.0 0.0 - 0.2 %  Basic metabolic panel     Status: Abnormal    Collection Time: 01/30/20  5:47 AM  Result Value Ref Range    Sodium 140 135 - 145 mmol/L    Potassium 4.1 3.5 - 5.1 mmol/L    Chloride 108 98 - 111 mmol/L    CO2 25 22 - 32 mmol/L    Glucose, Bld 138 (H) 70 - 99 mg/dL    BUN 15 8 - 23 mg/dL    Creatinine, Ser 0.82 0.61 - 1.24 mg/dL    Calcium 8.5 (L) 8.9 - 10.3 mg/dL    GFR calc non Af Amer >60 >60 mL/min    GFR calc Af Amer >60 >60 mL/min    Anion gap 7 5 - 15  Phosphorus     Status: Abnormal    Collection Time: 01/30/20  5:47 AM  Result Value Ref Range    Phosphorus 1.9 (L) 2.5 - 4.6 mg/dL  Magnesium     Status: None    Collection Time: 01/30/20  5:47 AM  Result Value Ref Range    Magnesium 2.2 1.7 - 2.4 mg/dL       Imaging Results (Last 48 hours)  DG CHEST PORT 1 VIEW   Result Date: 01/30/2020 CLINICAL DATA:  Re-evaluate ETT. EXAM: PORTABLE CHEST 1 VIEW COMPARISON:  January 27, 2020 FINDINGS: The ET tube is no longer visualized. The feeding tube terminates below today's film. No pneumothorax. Vascular crowding in the medial right lung base. Mild haziness over the left chest may be due to patient rotation. No focal infiltrates are noted. IMPRESSION: 1. The purpose of the study is to evaluate for an ETT. No ET tube identified. 2. The feeding tube terminates below today's film. 3. No other acute abnormalities. These results will be called to the ordering clinician or representative by the Radiologist Assistant, and communication documented in the PACS or [employer]. Electronically Signed   By: Dorise Bullion III M.D   On: 01/30/2020 10:56         Assessment/Plan: Diagnosis: L MCA and ACA infarcts 1. Does the need for close, 24 hr/day medical supervision in concert with the patient's rehab needs make it unreasonable for this patient to be served in a less intensive setting? Yes 2. Co-Morbidities requiring supervision/potential complications:  paroxysmal atrial fibrilation, dense right sided hemiplegia, L forced gaze, nonischemic cardiomyopathy, HTN 3. Due to bladder management, bowel management, safety, skin/wound care, disease management, medication administration, pain management and patient education, does the patient require 24 hr/day rehab nursing? Yes 4. Does the patient  require coordinated care of a physician, rehab nurse, therapy disciplines of PT, OT, SLP to address physical and functional deficits in the context of the above medical diagnosis(es)? Yes Addressing deficits in the following areas: balance, endurance, locomotion, strength, transferring, bowel/bladder control, bathing, dressing, feeding, grooming, toileting, cognition, speech, language, swallowing and psychosocial support 5. Can the patient actively participate in an intensive therapy program of at least 3 hrs of therapy per day at least 5 days per week? Yes 6. The potential for patient to make measurable gains while on inpatient rehab is excellent 7. Anticipated functional outcomes upon discharge from inpatient rehab are min assist  with PT, min assist with OT, supervision with SLP. 8. Estimated rehab length of stay to reach the above functional goals is: 2-3 weeks 9. Anticipated discharge destination: Home 10. Overall Rehab/Functional Prognosis: excellent   RECOMMENDATIONS: This patient's condition is appropriate for continued rehabilitative care in the following setting: CIR Patient has agreed to participate in recommended program. Potentially Note that insurance prior authorization may be required for reimbursement for recommended care.   Comment: Dustin Schneider would be an excellent CIR candidate if family support can be established upon discharge. He currently lives alone but will likely need support upon discharge. Thank you for this consult. We will continue to follow in Dustin Schneider care.    Izora Ribas, MD 01/30/2020        Routing History             Note Details  Author Ranell Patrick, Clide Deutscher, MD File Time 01/30/2020  8:41 PM  Author Type Physician Status Signed  Last Editor Izora Ribas, MD Service Physical Medicine and [place] # [phone] Admit Date 02/02/2020

## 2020-02-02 NOTE — Progress Notes (Signed)
Occupational Therapy Treatment Patient Details Name: Dustin Schneider MRN: [id number] DOB: 10-03-1955 Today's Date: 02/02/2020    History of present illness 64 y.o. male with history of paroxysmal A. fib on chronic Xarelto, nonischemic cardiomyopathy and hypertension.  Patient was at [facility] today.  Patient was noted to come out of the bathroom and suddenly collapsed by bystanders. On arrival they noted that he had a left forced gaze and right hemiplegia.  Patient was also nauseous and vomiting. of head and neck was immediately obtained and showed a dense left MCA. Pt underwent emergent L MCA and ACA revascularization on 6/16.   OT comments  Pt seen in conjunction with to PT to maximize activity tolerance and participation. Pt continues to present with aphasia, RUE weakness, decreased activity tolerance and impaired balance impacting pts ability to engage in BADLs.  Pt noted to have improvements in sitting balance this session able to sit EOB with periods of close min guard needing MOD A at most. Pt able to progress OOB to recliner via lateral scoot to pts L side. MAX A +2 for lateral scoot transfer. Highly recommend CIR for DC, will follow.   Follow Up Recommendations  CIR    Equipment Recommendations  None recommended by OT    Recommendations for Other Services      Precautions / Restrictions Precautions Precautions: Fall Restrictions Weight Bearing Restrictions: No       Mobility Bed Mobility Overal bed mobility: Needs Assistance Bed Mobility: Supine to Sit     Supine to sit: Max assist;+2 for physical assistance;+2 for safety/equipment;HOB elevated     General bed mobility comments: pt required assist to maneuver RLE to EOB but good strength noted in LLE noted to be able to advance LLE to EOB. Pt required MAX A +2 to elevate trunk and scoot hips to EOB. utilized elevated HOB on R to create lateral support if needed  Transfers Overall transfer level: Needs  assistance Equipment used: 2 person hand held assist Transfers: Lateral/Scoot Transfers          Lateral/Scoot Transfers: Max assist;+2 physical assistance;+2 safety/equipment General transfer comment: MAX A +2 to scoot to recliner to pts L side with use of bed pad. Pt requried tactile and verbal cues to shift weight anteriorly to off weight hips to scoot. pt noted to use LUE to reach to arm rest without cues. MAX A to reposition LLE during transfer    Balance Overall balance assessment: Needs assistance Sitting-balance support: Single extremity supported;Feet supported Sitting balance-Leahy Scale: Poor Sitting balance - Comments: pt continues to present with R lateral lean. initially MOD A for sitting balance advancing to periods of min guard with LUE supported and unsupported and elevate HOB on L side Postural control: Right lateral lean                                 ADL either performed or assessed with clinical judgement   ADL Overall ADL's : Needs assistance/impaired                     Lower Body Dressing: Total assistance;Bed level Lower Body Dressing Details (indicate cue type and reason): to don new sock Toilet Transfer: Maximal assistance;+2 for safety/equipment;+2 for physical assistance Toilet Transfer Details (indicate cue type and reason): simulated via functional mobility with lateral scoot to recliner' MAX A +2         Functional mobility  during ADLs: Maximal assistance;Total assistance;+2 for physical assistance;+2 for safety/equipment General ADL Comments: pt continues to present with aphasia, RUE weakness, and decreased activity tolerance impacting pts ability to complete BADLs     Vision       Perception     Praxis      Cognition Arousal/Alertness: Awake/alert Behavior During Therapy: WFL for tasks assessed/performed Overall Cognitive Status: Difficult to assess                                 General Comments:  Ovearll WFL, pt more engaged this session able to start to form sentences stating, " I could.." but unable to progress language more than a couple words at a time. Pt however nods appropriately and able to state "yes" and "no" with increased time and effort noted.        Exercises     Shoulder Instructions       General Comments      Pertinent Vitals/ Pain       Pain Assessment: No/denies pain  Home Living     Available Help at Discharge: Family;Friend(s);Available 24 hours/day [name], son [name] and Interior and spatial designer primary caregivers with o)         Home Layout: One level     Bathroom Shower/Tub:  ([name] working on Building services engineer while patient at [employer] small bat)     Bathroom Accessibility: Yes How Accessible: Accessible via walker (not easily accessible with wheelchair so to be redone asap)        Lives With: Alone    Prior Functioning/Environment              Frequency  Min 2X/week        Progress Toward Goals  OT Goals(current goals can now be found in the care plan section)  Progress towards OT goals: Progressing toward goals  Acute Rehab OT Goals Patient Stated Goal: to be able to sculpt again  OT Goal Formulation: With patient/family Time For Goal Achievement: 02/12/20 Potential to Achieve Goals: Good  Plan Discharge plan remains appropriate;Frequency remains appropriate    Co-evaluation    PT/OT/SLP Co-Evaluation/Treatment: Yes Reason for Co-Treatment: Complexity of the patient's impairments (multi-system involvement);For patient/therapist safety;To address functional/ADL transfers          AM-PAC OT "6 Clicks" Daily Activity     Outcome Measure   Help from another person eating meals?: Total Help from another person taking care of personal grooming?: A Little Help from another person toileting, which includes using toliet, bedpan, or urinal?: A Lot Help from another person bathing (including washing, rinsing, drying)?: A Lot   Help from  another person to put on and taking off regular lower body clothing?: Total 6 Click Score: 9    End of Session Equipment Utilized During Treatment: Gait belt  OT Visit Diagnosis: Hemiplegia and hemiparesis;Cognitive communication deficit (R41.841) Symptoms and signs involving cognitive functions: Cerebral infarction Hemiplegia - Right/Left: Right Hemiplegia - dominant/non-dominant: Dominant Hemiplegia - caused by: Cerebral infarction   Activity Tolerance Patient tolerated treatment well   Patient Left in chair;with call bell/phone within reach;with chair alarm set   Nurse Communication Mobility status;Need for lift equipment        Time: 1136-1207 OT Time Calculation (min): 31 min  Charges: OT General Charges $OT Visit: 1 Visit OT Treatments $Therapeutic Activity: 8-22 mins  Lanier Clam., COTA/L Acute Rehabilitation Services [phone]    Garvin Fila  Amaani Guilbault 02/02/2020, 3:40 PM

## 2020-02-02 NOTE — Progress Notes (Signed)
Inpatient Rehabilitation Admissions Coordinator  I have insurance approval and bed for CIR today. I contacted his sister, by phone, Belenda Cruise and Patent attorney for [employer]. She was aware of the approval and that I was awaiting CIR bed available earlier today. I contacted Burnetta Sabin, [facility] and will make the arrangements to admit today.  Danne Baxter, RN, MSN Rehab Admissions Coordinator [phone] 02/02/2020 3:29 PM

## 2020-02-02 NOTE — PMR Pre-admission (Signed)
PMR Admission Coordinator Pre-Admission Assessment  Patient: Dustin Schneider is an 64 y.o., male MRN: [id number] DOB: 1955-11-24 Height: '5\' 9"'  (175.3 cm) Weight: 84 kg              Insurance Information HMO:     PPO:      PCP:      IPA:      80/20:      OTHER:  PRIMARY: [place] BCBS of [place]      Policy#: [id number]      Subscriber: pt CM Name: Levy Pupa      Phone#: [phone]     Fax#: [phone] Pre-Cert#: [id number] approved until 7/6 when updates are due      Employer: [employer] Benefits:  Phone #: [phone]     Name: 6/22 Eff. Date: 08/14/2019     Deduct: $1250      Out of Pocket Max: $4890      Life Max: none  CIR: $300 co pay per admit then covers 80%      SNF: 80% 100 days per calender year Outpatient: $26 or $52 per visit     Co-Pay: visits per medical neccesity Home Health: 80%      Co-Pay: visits per medical neccesity DME: 80%     Co-Pay: 20% Providers: in network  SECONDARY: Medicare part A only      Policy#: [id number]    active 03/14/2019  Financial Counselor:       Phone#:   The Data Collection Information Summary for patients in Inpatient Rehabilitation Facilities with attached Privacy Act [place] Records was provided and verbally reviewed with: Patient and Family  Emergency Contact Information Contact Information    Name Relation Home Work Mobile   Carprnter,[name] Significant other [phone]     Halo, Laski   [phone]     Current Medical History  Patient Admitting Diagnosis: CVA  History of Present Illness:  64 year old right-handed male with history of PAF maintained on chronic Xarelto as well as Tikosyn, nonischemic cardiomyopathy followed by Dr. [name] and hypertension. 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  Presented 01/27/2020 with acute onset of left forced gaze and right hemiplegia as well as aphasia.  Patient was also nauseous and vomiting.  CT of the head showed  hyperdense left M2 branch compatible with acute thrombus.  No acute infarct or hemorrhage.  Patient did not receive TPA as was already on Xarelto chronically.  CTA of head and neck showed occlusion of left MCA bifurcation with little flow in the superior and inferior divisions left MCA.  No other intracranial stenosis or occlusion.  Underwent emergent left MCA and ACA revascularization 01/27/2020 per interventional radiology.  Follow-up MRI/MRA showed negative intracranial MRA patent left MCA and ACA.  Confluent left basal ganglia infarct with edema and mild swelling.  Heidelberg classification 1 a petechial hemorrhage at the caudate.  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  Echocardiogram with ejection fraction of 60% grade 2 diastolic dysfunction.  Presently on aspirin 325 mg daily for CVA prophylaxis and awaiting plan to possibly switch to Eliquis once patient is able to swallow..  Subcutaneous heparin for DVT prophylaxis.  Patient is n.p.o. with alternative means of nutritional support.  MBS 6/22/ with recommendations for Dysphagia 2 with nectar thin liquids. Meds crushed with puree.  Complete NIHSS TOTAL: 19 Glasgow Coma Scale Score: 11  Past Medical History  Past Medical History:  Diagnosis Date   Hypertension  NICM (nonischemic cardiomyopathy) ([place]) 06/15/2018   04/23/18: EF 45%, 09/15/18: NOrmal LVEF   Paroxysmal atrial fibrillation ([place]) 06/15/2018   Visit for monitoring Tikosyn therapy 06/12/2018    Family History  family history includes Atrial fibrillation in his father; Glaucoma in his mother.  Prior Rehab/Hospitalizations:  Has the patient had prior rehab or hospitalizations prior to admission? Yes  Has the patient had major surgery during 100 days prior to admission? No  Current Medications   Current Facility-Administered Medications:     stroke: mapping our early stages of recovery book, , Does not apply, Once, Biby, Sharon L, NP   0.9  %  sodium chloride infusion, , Intravenous, PRN, Donzetta Starch, NP, Stopped at 01/29/20 1640   acetaminophen (TYLENOL) tablet 650 mg, 650 mg, Oral, Q4H PRN **OR** acetaminophen (TYLENOL) 160 MG/5ML solution 650 mg, 650 mg, Per Tube, Q4H PRN, 650 mg at 01/30/20 2020 **OR** acetaminophen (TYLENOL) suppository 650 mg, 650 mg, Rectal, Q4H PRN, Burnetta Sabin L, NP   aspirin tablet 325 mg, 325 mg, Per Tube, Daily, 325 mg at 02/02/20 0948 **OR** aspirin suppository 300 mg, 300 mg, Rectal, Daily, Biby, Sharon L, NP, 300 mg at 01/29/20 1009   bisacodyl (DULCOLAX) suppository 10 mg, 10 mg, Rectal, Daily PRN, Burnetta Sabin L, NP, 10 mg at 01/31/20 1729   chlorhexidine gluconate (MEDLINE KIT) (PERIDEX) 0.12 % solution 15 mL, 15 mL, Mouth Rinse, BID, Biby, Sharon L, NP, 15 mL at 02/02/20 0825   Chlorhexidine Gluconate Cloth 2 % PADS 6 each, 6 each, Topical, Daily, Biby, Sharon L, NP, 6 each at 02/01/20 0803   feeding supplement (OSMOLITE 1.5 CAL) liquid 1,000 mL, 1,000 mL, Per Tube, Continuous, Biby, Sharon L, NP, Last Rate: 55 mL/hr at 02/02/20 1419, 1,000 mL at 02/02/20 1419   feeding supplement (PRO-STAT SUGAR FREE 64) liquid 30 mL, 30 mL, Per Tube, BID, Biby, Sharon L, NP, 30 mL at 02/02/20 0949   free water 30 mL, 30 mL, Per Tube, Q4H, Biby, Sharon L, NP, 30 mL at 02/02/20 1238   heparin injection 5,000 Units, 5,000 Units, Subcutaneous, Q8H, Biby, Sharon L, NP, 5,000 Units at 02/02/20 1406   hydrALAZINE (APRESOLINE) injection 5 mg, 5 mg, Intravenous, Q6H PRN, Miachel Roux, Sharon L, NP, 5 mg at 01/31/20 1944   irbesartan (AVAPRO) tablet 150 mg, 150 mg, Per Tube, Daily, Biby, Sharon L, NP, 150 mg at 02/02/20 0948   MEDLINE mouth rinse, 15 mL, Mouth Rinse, q12n4p, Biby, Sharon L, NP, 15 mL at 02/02/20 1238   metoprolol tartrate (LOPRESSOR) 25 mg/10 mL oral suspension 100 mg, 100 mg, Per Tube, BID, Biby, Sharon L, NP, 100 mg at 02/02/20 0949   pantoprazole sodium (PROTONIX) 40 mg/20 mL oral suspension 40 mg,  40 mg, Per Tube, Q1200, Biby, Sharon L, NP, 40 mg at 02/02/20 1237   Resource ThickenUp Clear, , Oral, PRN, Garvin Fila, MD   rosuvastatin (CRESTOR) tablet 10 mg, 10 mg, Per Tube, Daily, Biby, Sharon L, NP, 10 mg at 02/02/20 0948   senna-docusate (Senokot-S) tablet 1 tablet, 1 tablet, Oral, QHS PRN, Donzetta Starch, NP  Patients Current Diet:  Diet Order            DIET DYS 2 Room service appropriate? 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  Independent  Indoor Mobility: Did the patient need assistance with walking from room to room (with or without device)? Independent  Stairs: Did the patient need assistance with internal or external stairs (with or without device)? Independent  Functional Cognition: Did the patient need help planning regular tasks such as shopping or remembering to take medications? Independent  Home Assistive Devices / Equipment Home Equipment: None  Prior Device Use: Indicate devices/aids used by the patient prior to current illness, exacerbation or injury? None of the above  Current Functional Level Cognition  Arousal/Alertness: Awake/alert Overall Cognitive Status: Difficult to assess Difficult to assess due to: Impaired communication Orientation Level: Other (comment) (UTA) General Comments: responds to yes/no questions well and consistently.  Pt appears animated with expressions during session, and periods of laughing noted. Pt attempted to speak to PT multiple times during session, unable. Attention: Sustained Sustained Attention: Appears intact (during simple, functional tasks)    Extremity Assessment (includes Sensation/Coordination)  Upper Extremity Assessment: RUE deficits/detail RUE Deficits / Details: Pt with no AROM Rt UE, however, there was an increase in tone/tension when he was asked to move Rt UE  RUE Coordination: decreased fine motor, decreased gross motor  Lower Extremity Assessment: Defer to PT evaluation RLE Deficits / Details: withdraws to pain, increased knee flexor tone noted, PROM WFL, flickers of AROM noted at hip and with knee extensors    ADLs  Overall ADL's : Needs assistance/impaired Eating/Feeding: NPO Grooming: Wash/dry hands, Wash/dry face, Oral care, Brushing hair, Moderate assistance, Sitting Grooming Details (indicate cue type and reason): Pt using yaunker mod I while in bed  Upper Body Bathing: Maximal assistance, Sitting Lower Body Bathing: Maximal assistance, Sit to/from stand Upper Body Dressing : Total assistance, Sitting Lower Body Dressing: Total assistance, Bed level Lower Body Dressing Details (indicate cue type and reason): to don new sock Toilet Transfer: Maximal assistance, +2 for safety/equipment, +2 for physical assistance Toilet Transfer Details (indicate cue type and reason): simulated via functional mobility with lateral scoot to recliner' MAX A +2 Toileting- Clothing Manipulation and Hygiene: Total assistance, Sit to/from stand Functional mobility during ADLs: Maximal assistance, Total assistance, +2 for physical assistance, +2 for safety/equipment General ADL Comments: pt continues to present with aphasia    Mobility  Overal bed mobility: Needs Assistance Bed Mobility: Supine to Sit Supine to sit: Max assist, +2 for physical assistance, +2 for safety/equipment, HOB  elevated General bed mobility comments: Max +2 for LE and truncal management, scooting to EOB with use of bed pads and lateral weight shifting. R lateral leaning, aided by pelvic facilitation and HOB elevation as barrier from falling R (pt sitting on R side of bed).    Transfers  Overall transfer level: Needs assistance Equipment used: 2 person hand held assist Transfers: Sit to/from Stand, Set designer Transfers Sit to Stand: Max assist, +2 physical assistance, +2 safety/equipment, From elevated surface Stand pivot transfers: Max assist Squat pivot transfers: Max assist, +2 physical assistance, +2 safety/equipment, From elevated surface General transfer comment: Max +2 for sit to stand for trunk elevation, R knee blocking, and upright posture  once standing. Pt attempting to step when pt very unsteady, aborted stand pivot and opted for swing pivot to drop arm recliner towards L. Max +2 for squat pivot for body translation to chair, steadying, safe slow eccentric lower into chair, and scooting back in recliner with use of bed pads once seated.    Ambulation / Gait / Stairs / Wheelchair Mobility  Ambulation/Gait General Gait Details: unable this day    Posture / Balance Dynamic Sitting Balance Sitting balance - Comments: R lateral leaning, require posterior truncal assist and pelvic facilitation for postural correction. Pt responds well to visual cuing of midline to correct own posture as well. Balance Overall balance assessment: Needs assistance Sitting-balance support: Single extremity supported, Feet supported Sitting balance-Leahy Scale: Poor Sitting balance - Comments: R lateral leaning, require posterior truncal assist and pelvic facilitation for postural correction. Pt responds well to visual cuing of midline to correct own posture as well. Postural control: Right lateral lean Standing balance support: Bilateral upper extremity supported Standing balance-Leahy Scale: Zero Standing  balance comment: maxA to maintain standing due to strong R lateral lean    Special needs/care consideration Cortrak placed 6/18 43 inches 10 fr left nare MBS 6/22 with diet upgrade Designated visitors are significant other, [name] and brother in Sports coach Dr. Rosana Fret    Previous Home Environment  Living Arrangements: Alone  Lives With: Alone Available Help at Discharge: Family, Friend(s), Available 24 hours/day [name], son [name] and Interior and spatial designer primary caregivers with o) Type of Home: House Home Layout: One level Home Access: Stairs to enter Entrance Stairs-Rails: None Entrance Stairs-Number of Steps: 1 Bathroom Shower/Tub:  ([name] working on Building services engineer while patient at [employer] small bat) Bathroom Toilet: Standard Bathroom Accessibility: Yes How Accessible: Accessible via walker (not easily accessible with wheelchair so to be redone asap) Home Care Services: No  Discharge Living Setting Plans for Discharge Living Setting: Patient's home, Alone Type of Home at Discharge: House Discharge Home Layout: One level Discharge Home Access: Stairs to enter Entrance Stairs-Rails: None Entrance Stairs-Number of Steps: 1 Discharge Bathroom Shower/Tub: Tub/shower unit, Curtain (bathroom to be remodeled asap) Discharge Bathroom Toilet: Standard Discharge Bathroom Accessibility: Yes How Accessible: Accessible via walker Does the patient have any problems obtaining your medications?: No  Social/Family/Support Systems Patient Roles: Partner, Parent (works a and Designer, television/film set and also Development worker, community with [place] b) Sport and exercise psychologist Information: [name], significant other, son [name], sister Belenda Cruise and her husband [name] Anticipated Caregiver: Jhonnie Garner and friends. Also to hire caregivers Anticipated Caregiver's Contact Information: see above Ability/Limitations of Caregiver: [name] works as a Surveyor, minerals, son , [name] works, Belenda Cruise can come form Games developer and hired caregivers Caregiver  Availability: 24/7 Discharge Plan Discussed with Primary Caregiver: Yes Is Caregiver In Agreement with Plan?: Yes Does Caregiver/Family have Issues with Lodging/Transportation while Pt is in Rehab?: No  Goals Patient/Family Goal for Rehab: min assist PT, OT, and SLP  Expected length of stay: [facility] 2 to 3 weeks Additional Information: I have advised family to get lawyer to assist with financial and [facility] POA designations Pt/Family Agrees to Admission and willing to participate: Yes Program Orientation Provided & Reviewed with Pt/Caregiver Including Roles  & Responsibilities: Yes  Decrease burden of Care through IP rehab admission: n/a  Possible need for SNF placement upon discharge:not anticipated  Patient Condition: This patient's medical and functional status has changed since the consult dated: 01/30/2020 in which the Rehabilitation Physician determined and documented that the patient's condition is appropriate for intensive rehabilitative  care in an inpatient rehabilitation facility. See "History of Present Illness" (above) for medical update. Functional changes are: overall max assist. Patient's medical and functional status update has been discussed with the Rehabilitation physician and patient remains appropriate for inpatient rehabilitation. Will admit to inpatient rehab today.  Preadmission Screen Completed By:  Cleatrice Burke, RN, 02/02/2020 3:11 PM ______________________________________________________________________   Discussed status with Dr. Ranell Patrick on 02/02/2020 at  1527 and received approval for admission today.  Admission Coordinator:  Cleatrice Burke, time [id number] Date 02/02/2020

## 2020-02-02 NOTE — Discharge Summary (Addendum)
Stroke Discharge Summary  Patient ID: Dustin Schneider   MRN: [id number]      DOB: February 26, 1956  Date of Admission: 01/27/2020 Date of Discharge: 02/02/2020  Attending Physician:  Garvin Fila, MD, Stroke MD Consultant(s):    Pedro Earls MD (Interventional Neuroradiologist), Kipp Brood, MD ( pulmonary/intensive care ), Leeroy Cha, MD (Physical Medicine & Rehabilitation)   Patient's PCP:  Patient, No Pcp Per  Discharge Diagnoses:  Principal Problem:   Acute ischemic stroke [facility]) s/p clot retrieval L MCA & ACA A3 Active Problems:   Paroxysmal atrial fibrillation ([place])   Essential hypertension   NICM (nonischemic cardiomyopathy) ([place])   Aspiration into airway   Chronic anticoagulation   Acute respiratory failure ([place])   Hyperlipidemia   Dysphagia following cerebral infarction   Aspiration pneumonia ([place])  Medications to be continued on Rehab Allergies as of 02/02/2020   No Known Allergies      Medication List     STOP taking these medications    dofetilide 500 MCG capsule Commonly known as: TIKOSYN   MAGNESIUM CITRATE PO   metoprolol succinate 100 MG 24 hr tablet Commonly known as: TOPROL-XL   Potassium 99 MG Tabs   valsartan 320 MG tablet Commonly known as: DIOVAN   Xarelto 20 MG Tabs tablet Generic drug: rivaroxaban       TAKE these medications    apixaban 5 MG Tabs tablet Commonly known as: ELIQUIS Take 1 tablet (5 mg total) by mouth 2 (two) times daily.   bisacodyl 10 MG suppository Commonly known as: DULCOLAX Place 1 suppository (10 mg total) rectally daily as needed for moderate constipation.   chlorhexidine gluconate (MEDLINE KIT) 0.12 % solution Commonly known as: PERIDEX 15 mLs by Mouth Rinse route 2 (two) times daily.   Chlorhexidine Gluconate Cloth 2 % Pads Apply 6 each topically daily. Start taking on: February 03, 2020   feeding supplement (OSMOLITE 1.5 CAL) Liqd Place 1,000 mLs into feeding tube continuous.    feeding supplement (PRO-STAT SUGAR FREE 64) Liqd Place 30 mLs into feeding tube 2 (two) times daily.   free water Soln Place 30 mLs into feeding tube every 4 (four) hours.   irbesartan 150 MG tablet Commonly known as: AVAPRO Place 1 tablet (150 mg total) into feeding tube daily. Start taking on: February 03, 2020   metoprolol tartrate 25 mg/10 mL Susp Commonly known as: LOPRESSOR Place 40 mLs (100 mg total) into feeding tube 2 (two) times daily.   mouth rinse Liqd solution 15 mLs by Mouth Rinse route 2 times daily at 12 noon and 4 pm.   Resource ThickenUp Clear Powd Take 120 g by mouth as needed (nectar thick liquids).   rosuvastatin 10 MG tablet Commonly known as: CRESTOR Place 1 tablet (10 mg total) into feeding tube daily. Start taking on: February 03, 2020 What changed: how to take this   senna-docusate 8.6-50 MG tablet Commonly known as: Senokot-S Take 1 tablet by mouth at bedtime as needed for mild constipation.        LABORATORY STUDIES CBC    Component Value Date/Time   WBC 9.3 02/02/2020 0558   RBC 4.34 02/02/2020 0558   HGB 13.8 02/02/2020 0558   HGB 15.3 12/17/2019 0831   HCT 42.5 02/02/2020 0558   HCT 45.5 12/17/2019 0831   PLT 206 02/02/2020 0558   PLT 220 12/17/2019 0831   MCV 97.9 02/02/2020 0558   MCV 94 12/17/2019 0831   MCH 31.8  02/02/2020 0558   MCHC 32.5 02/02/2020 0558   RDW 13.8 02/02/2020 0558   RDW 13.3 12/17/2019 0831   LYMPHSABS 3.9 01/27/2020 1341   MONOABS 0.8 01/27/2020 1341   EOSABS 0.2 01/27/2020 1341   BASOSABS 0.1 01/27/2020 1341   CMP    Component Value Date/Time   NA 142 02/02/2020 0558   NA 139 12/17/2019 0836   K 3.8 02/02/2020 0558   CL 103 02/02/2020 0558   CO2 30 02/02/2020 0558   GLUCOSE 115 (H) 02/02/2020 0558   BUN 24 (H) 02/02/2020 0558   BUN 21 12/17/2019 0836   CREATININE 1.02 02/02/2020 0558   CALCIUM 9.1 02/02/2020 0558   PROT 6.2 (L) 01/27/2020 1341   PROT 6.4 12/17/2019 0836   ALBUMIN 4.1 01/27/2020  1341   ALBUMIN 4.4 12/17/2019 0836   AST 23 01/27/2020 1341   ALT 37 01/27/2020 1341   ALKPHOS 45 01/27/2020 1341   BILITOT 1.0 01/27/2020 1341   BILITOT 0.7 12/17/2019 0836   GFRNONAA >60 02/02/2020 0558   GFRAA >60 02/02/2020 0558   COAGS Lab Results  Component Value Date   INR 1.0 01/27/2020   Lipid Panel    Component Value Date/Time   CHOL 125 01/28/2020 0716   CHOL 226 (H) 12/17/2019 0831   TRIG 298 (H) 01/28/2020 0716   HDL 32 (L) 01/28/2020 0716   HDL 39 (L) 12/17/2019 0831   CHOLHDL 3.9 01/28/2020 0716   VLDL 60 (H) 01/28/2020 0716   LDLCALC 33 01/28/2020 0716   LDLCALC 154 (H) 12/17/2019 0831   HgbA1C  Lab Results  Component Value Date   HGBA1C 5.6 01/28/2020    SIGNIFICANT DIAGNOSTIC STUDIES MR ANGIO HEAD WO CONTRAST  Result Date: 01/28/2020 CLINICAL DATA:  64 year old male code stroke presentation with left MCA occlusion, left ACA A3 occlusion. Endovascular reperfusion yesterday. EXAM: MRI HEAD WITHOUT CONTRAST MRA HEAD WITHOUT CONTRAST TECHNIQUE: Multiplanar, multiecho pulse sequences of the brain and surrounding structures were obtained without intravenous contrast. Angiographic images of the head were obtained using MRA technique without contrast. COMPARISON:  CTA head and neck yesterday. FINDINGS: MRI HEAD FINDINGS Brain: Dense restricted diffusion in the left caudate and ventral putamen, with associated cytotoxic edema and caudate swelling. Petechial hemorrhage (Heidelberg Classification 1A, series 8, image 48). There are also a few punctate foci of restricted diffusion in the nearby left inferior frontal gyrus and left insula, such as on series 2, image 28. Superimposed gyriform restricted diffusion along the left cingulate, and also involving posterior left superior frontal gyrus and parietal lobe (series 2, image 44 and series 4, image 17. T2 and FLAIR hyperintensity with gyral edema. No hemorrhage. One or 2 punctate areas of contralateral right caudate  restricted diffusion, most notably series 2, image 38. T2 and FLAIR hyperintensity with no hemorrhage or mass effect. No other restricted diffusion. Chronic appearing hemosiderin in right inferior frontal gyrus has associated dark T2 rim and punctate internal T2 hyperintensity compatible with a small cavernous venous vascular malformation. No developmental venous anomaly associated on CTA. No regional edema or mass effect. Trace subarachnoid hemorrhage would be difficult to exclude such as in the left occipital lobe sulci on series 7, image 14. Also questionable trace blood in the left temporal and occipital horns, versus subependymal chronic microhemorrhages there. No ventriculomegaly. No midline shift. Basilar cisterns remain normal. Cervicomedullary junction and pituitary are within normal limits. Outside of the acute findings there are a few scattered areas of subcortical white matter T2 and FLAIR hyperintensity. The  right basal ganglia, thalami, brainstem and cerebellum appear negative. Vascular: Major intracranial vascular flow voids are preserved. Skull and upper cervical spine: Negative visible cervical spine, bone marrow signal. Sinuses/Orbits: Negative orbits. Mild to moderate paranasal sinus mucosal thickening. Other: Intubated.  Fluid in the pharynx.  Mastoids remain clear. MRA HEAD FINDINGS Antegrade flow in the posterior circulation with mildly dominant left V4 segment and no distal vertebral stenosis. Patent PICA origins, vertebrobasilar junction, basilar artery, AICA origins, SCA and PCA origins. Tortuous left P1 and fetal type left PCA on that side. Small right posterior communicating artery. Bilateral PCA branches are within normal limits. Antegrade flow in both ICA siphons, the left appears dominant with dominant left ACA. No siphon stenosis. Patent carotid termini. Normal ophthalmic and posterior communicating artery origins. Patent MCA and ACA origins. Anterior communicating artery and visible  ACA branches are within normal limits. Restored patency on the left. Left MCA M1 segment and left MCA trifurcation are patent without stenosis. Left MCA branches appear within normal limits. Right MCA bifurcates early without stenosis. Visible right MCA branches are within normal limits. IMPRESSION: 1. Negative intracranial MRA, patent left MCA and ACA. 2. Confluent left basal ganglia infarct with edema and mild swelling. Left cingulate and additional left superior frontal / superior perirolandic gyral restricted diffusion with mild cortical edema. A few punctate foci of restricted diffusion elsewhere including at the right cingulate. 3. Heidelberg Classification 1A petechial hemorrhage at the caudate. Difficult to exclude trace left hemisphere SAH and IVH, repeat plain Head CT could evaluate further. No significant intracranial mass effect. 4. 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 Electronically Signed   By: Genevie Ann M.D.   On: 01/28/2020 09:45   MR BRAIN WO CONTRAST  Result Date: 01/28/2020 CLINICAL DATA:  64 year old male code stroke presentation with left MCA occlusion, left ACA A3 occlusion. Endovascular reperfusion yesterday. EXAM: MRI HEAD WITHOUT CONTRAST MRA HEAD WITHOUT CONTRAST TECHNIQUE: Multiplanar, multiecho pulse sequences of the brain and surrounding structures were obtained without intravenous contrast. Angiographic images of the head were obtained using MRA technique without contrast. COMPARISON:  CTA head and neck yesterday. FINDINGS: MRI HEAD FINDINGS Brain: Dense restricted diffusion in the left caudate and ventral putamen, with associated cytotoxic edema and caudate swelling. Petechial hemorrhage (Heidelberg Classification 1A, series 8, image 48). There are also a few punctate foci of restricted diffusion in the nearby left inferior frontal gyrus and left insula, such as on series 2, image 28. Superimposed gyriform restricted diffusion along the left  cingulate, and also involving posterior left superior frontal gyrus and parietal lobe (series 2, image 44 and series 4, image 17. T2 and FLAIR hyperintensity with gyral edema. No hemorrhage. One or 2 punctate areas of contralateral right caudate restricted diffusion, most notably series 2, image 38. T2 and FLAIR hyperintensity with no hemorrhage or mass effect. No other restricted diffusion. Chronic appearing hemosiderin in right inferior frontal gyrus has associated dark T2 rim and punctate internal T2 hyperintensity compatible with a small cavernous venous vascular malformation. No developmental venous anomaly associated on CTA. No regional edema or mass effect. Trace subarachnoid hemorrhage would be difficult to exclude such as in the left occipital lobe sulci on series 7, image 14. Also questionable trace blood in the left temporal and occipital horns, versus subependymal chronic microhemorrhages there. No ventriculomegaly. No midline shift. Basilar cisterns remain normal. Cervicomedullary junction and pituitary are within normal limits. Outside of the acute findings there are a few scattered areas of subcortical white matter  T2 and FLAIR hyperintensity. The right basal ganglia, thalami, brainstem and cerebellum appear negative. Vascular: Major intracranial vascular flow voids are preserved. Skull and upper cervical spine: Negative visible cervical spine, bone marrow signal. Sinuses/Orbits: Negative orbits. Mild to moderate paranasal sinus mucosal thickening. Other: Intubated.  Fluid in the pharynx.  Mastoids remain clear. MRA HEAD FINDINGS Antegrade flow in the posterior circulation with mildly dominant left V4 segment and no distal vertebral stenosis. Patent PICA origins, vertebrobasilar junction, basilar artery, AICA origins, SCA and PCA origins. Tortuous left P1 and fetal type left PCA on that side. Small right posterior communicating artery. Bilateral PCA branches are within normal limits. Antegrade flow in  both ICA siphons, the left appears dominant with dominant left ACA. No siphon stenosis. Patent carotid termini. Normal ophthalmic and posterior communicating artery origins. Patent MCA and ACA origins. Anterior communicating artery and visible ACA branches are within normal limits. Restored patency on the left. Left MCA M1 segment and left MCA trifurcation are patent without stenosis. Left MCA branches appear within normal limits. Right MCA bifurcates early without stenosis. Visible right MCA branches are within normal limits. IMPRESSION: 1. Negative intracranial MRA, patent left MCA and ACA. 2. Confluent left basal ganglia infarct with edema and mild swelling. Left cingulate and additional left superior frontal / superior perirolandic gyral restricted diffusion with mild cortical edema. A few punctate foci of restricted diffusion elsewhere including at the right cingulate. 3. Heidelberg Classification 1A petechial hemorrhage at the caudate. Difficult to exclude trace left hemisphere SAH and IVH, repeat plain Head CT could evaluate further. No significant intracranial mass effect. 4. 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 Electronically Signed   By: Genevie Ann M.D.   On: 01/28/2020 09:45   IR CT Head Ltd  Result Date: 02/01/2020 INDICATION: 64 year old male with past medical history significant for arcs is small atrial fibrillation on Xarelto, nonischemic cardiomyopathy and hypertension presenting with left course gaze and right hemiplegia, NIHSS 19. He was last seen well at 13:00 on 01/27/2020. Premorbid modified Rankin scale 0. Head CT showed no evidence of a large territorial infarct. CT angiogram of the head and neck showed a distal left M1/MCA occlusion and a left A2-A3/ACA occlusion no intravenous tPA given. He was taken to our service for emergency mechanical thrombectomy. EXAM: Diagnostic cerebral angiogram and mechanical thrombectomy. COMPARISON:  CT/CT angiogram of the  head and neck January 27, 2020. MEDICATIONS: No antibiotics given. ANESTHESIA/SEDATION: The procedure was performed under general anesthesia. FLUOROSCOPY TIME:  Fluoroscopy Time: 46 minutes 36 seconds (781 mGy). COMPLICATIONS: None immediate. TECHNIQUE: Informed written consent was obtained from the patient's next of kin after a thorough discussion of the procedural risks, benefits and alternatives. All questions were addressed. Maximal Sterile Barrier Technique was utilized including caps, mask, sterile gowns, sterile gloves, sterile drape, hand hygiene and skin antiseptic. A timeout was performed prior to the initiation of the procedure. The right groin was prepped and draped in the usual sterile fashion. Using a micropuncture kit and the modified Seldinger technique, access was gained to the right common femoral artery and an 8 French sheath was placed. Under fluoroscopy, a Zoom 88 guide catheter was navigated over a 6 [place] Berenstein 2 catheter and a 0.035 inch Terumo Glidewire into the aortic arch. The catheter was placed into the left common carotid artery and then advanced into the left internal carotid artery. The inner catheter was removed. Frontal and lateral angiograms of the head were obtained. FINDINGS: 1. Occlusion of the distal left  M1/MCA. 2. Occlusion of the left pericallosal artery at the A3 segment. PROCEDURE: Under biplane roadmap, a Zoom 71 aspiration catheter was navigated over a synchro support microguidewire 2 into the left M1/MCA. The wire was removed. Aspiration catheter was connected to an aspiration and advanced to the occlusion level. Continuous aspiration was performed for 4 minutes. The aspiration catheter was then removed under constant aspiration. The guiding catheter was aspirated and brisk blood return was seen. Left ICA angiograms with frontal and lateral views of the head showed complete recanalization of the left MCA (TICI3). Under biplane roadmap, is some 35 aspiration catheter  was navigated over and Aristotle 14 micro guidewire into the left pericallosal artery at the level of occlusion. Continuous aspiration was performed for 3 minutes. The aspiration catheter was then removed under constant aspiration. The guiding catheter was aspirated and brisk blood return was seen. Left ICA angiograms with frontal and lateral views of the head showed persistent occlusion of the left pericallosal artery. In a similar fashion, a 2nd aspiration pass was performed in the left pericallosal artery. Left ICA angiograms with frontal and lateral views of the head showed persistent occlusion of the left pericallosal artery. Under biplane roadmap, a Zoom 71 aspiration catheter was navigated over a phenom 21 microcatheter and a Aristotle 14 microguidewire into the cavernous segment of the left ICA. The microcatheter was then navigated over the wire into the left A3/ACA. Then, a 3 mm solitaire stent retriever was deployed spanning the A3 segment. The device was allowed to intercalated with the clot for 4 minutes. The microcatheter was removed. The aspiration catheter was connected to a penumbra aspiration pump. The thrombectomy device and aspiration catheter were removed under constant aspiration. Follow-up left ICA angiogram showed complete recanalization of the left ACA (TICI 3). Flat panel CT of the head was obtained and post processed in a separate workstation with concurrent attending physician supervision. Selected images were sent to PACS. No evidence of hemorrhagic complication. Distantly 88 catheter was retracted to the level of the left common carotid artery. Frontal and lateral angiograms of the neck were obtained. Increased tortuosity of the cervical left ICA was noted. Minimal atherosclerotic changes seen in the left carotid bulb. No stenosis. IMPRESSION: 1. Successful and uncomplicated mechanical thrombectomy for treatment of a distal left M1/MCA with direct contact aspiration. One is pass  performed with complete recanalization (TICI 3). 2. Successful and uncomplicated mechanical thrombectomy for treatment of a left A3/ACA occlusion. Total of 2 direct contact aspiration passes and 1 stent retriever pass with complete recanalization (TICI 3). 3. No embolus to distal or new territory. 4. No hemorrhagic complication on postprocedural flat panel CT. PLAN: - Patient will remain intubated due to concerns for aspiration. - SBP 120-140.- Bed rest 6 hours.- F/U MRI within 24h. Electronically Signed   By: Pedro Earls M.D.   On: 02/01/2020 16:57   DG CHEST PORT 1 VIEW  Result Date: 01/30/2020 CLINICAL DATA:  Re-evaluate ETT. EXAM: PORTABLE CHEST 1 VIEW COMPARISON:  January 27, 2020 FINDINGS: The ET tube is no longer visualized. The feeding tube terminates below today's film. No pneumothorax. Vascular crowding in the medial right lung base. Mild haziness over the left chest may be due to patient rotation. No focal infiltrates are noted. IMPRESSION: 1. The purpose of the study is to evaluate for an ETT. No ET tube identified. 2. The feeding tube terminates below today's film. 3. No other acute abnormalities. These results will be called to the ordering clinician  or representative by the Radiologist Assistant, and communication documented in the PACS or [employer]. Electronically Signed   By: Dorise Bullion III M.D   On: 01/30/2020 10:56   DG CHEST PORT 1 VIEW  Result Date: 01/27/2020 CLINICAL DATA:  Stroke, intubated EXAM: PORTABLE CHEST 1 VIEW COMPARISON:  None. FINDINGS: Single frontal view of the chest demonstrates unremarkable cardiac silhouette. Ectasia of the thoracic aorta. Endotracheal tube overlies tracheal air column tip well above carina. Enteric catheter passes below diaphragm tip and side port project over gastric fundus. No airspace disease, effusion, or pneumothorax. No acute bony abnormalities. IMPRESSION: 1. Support devices as above. 2. No acute intrathoracic process.  Electronically Signed   By: Randa Ngo M.D.   On: 01/27/2020 19:08   ECHOCARDIOGRAM COMPLETE  Result Date: 01/28/2020    ECHOCARDIOGRAM REPORT   Patient Name:   Dustin Schneider Date of Exam: 01/28/2020 Medical Rec #:  [id number]        Height:       69.0 in Accession #:    [id number]       Weight:       185.2 lb Date of Birth:  18-Feb-1956        BSA:          2.000 m Patient Age:    54 years         BP:           133/81 mmHg Patient Gender: M                HR:           52 bpm. Exam Location:  Inpatient Procedure: 2D Echo, 3D Echo, Color Doppler and Cardiac Doppler Indications:    Stroke i163.9  History:        Patient has prior history of Echocardiogram examinations, most                 recent 09/16/2018. Arrythmias:Atrial Fibrillation; Risk                 Factors:Hypertension.  Sonographer:    Raquel Sarna Senior RDCS Referring Phys: [id number] ASHISH ARORA  Sonographer Comments: Echo performed with patient supine and on artificial respirator. IMPRESSIONS  1. Left ventricular ejection fraction, by estimation, is 55 to 60%. The left ventricle has normal function. The left ventricle has no regional wall motion abnormalities. Left ventricular diastolic parameters are consistent with Grade II diastolic dysfunction (pseudonormalization).  2. Right ventricular systolic function is normal. The right ventricular size is normal. There is mildly elevated pulmonary artery systolic pressure.  3. Left atrial size was moderately dilated.  4. Right atrial size was moderately dilated.  5. The mitral valve is normal in structure. Mild mitral valve regurgitation.  6. The aortic valve is normal in structure. Aortic valve regurgitation is mild to moderate.  7. The inferior vena cava is dilated in size with >50% respiratory variability, suggesting right atrial pressure of 8 mmHg. FINDINGS  Left Ventricle: Left ventricular ejection fraction, by estimation, is 55 to 60%. The left ventricle has normal function. The left ventricle has no  regional wall motion abnormalities. The left ventricular internal cavity size was normal in size. There is  no left ventricular hypertrophy. Left ventricular diastolic parameters are consistent with Grade II diastolic dysfunction (pseudonormalization). Right Ventricle: The right ventricular size is normal. No increase in right ventricular wall thickness. Right ventricular systolic function is normal. There is mildly elevated pulmonary artery systolic pressure. The tricuspid regurgitant velocity  is 2.35  m/s, and with an assumed right atrial pressure of 15 mmHg, the estimated right ventricular systolic pressure is 38.1 mmHg. Left Atrium: Left atrial size was moderately dilated. Right Atrium: Right atrial size was moderately dilated. Pericardium: There is no evidence of pericardial effusion. Mitral Valve: The mitral valve is normal in structure. There is mild prolapse of of the mitral valve. Mild mitral valve regurgitation. Tricuspid Valve: The tricuspid valve is normal in structure. Tricuspid valve regurgitation is trivial. Aortic Valve: The aortic valve is normal in structure. Aortic valve regurgitation is mild to moderate. Pulmonic Valve: The pulmonic valve was normal in structure. Pulmonic valve regurgitation is trivial. Aorta: The aortic root and ascending aorta are structurally normal, with no evidence of dilitation. Venous: The inferior vena cava is dilated in size with greater than 50% respiratory variability, suggesting right atrial pressure of 8 mmHg. IAS/Shunts: No atrial level shunt detected by color flow Doppler.  LEFT VENTRICLE PLAX 2D LVIDd:         5.40 cm  Diastology LVIDs:         3.30 cm  LV e' lateral:   12.00 cm/s LV PW:         0.70 cm  LV E/e' lateral: 8.3 LV IVS:        0.70 cm  LV e' medial:    10.20 cm/s LVOT diam:     2.20 cm  LV E/e' medial:  9.8 LV SV:         82 LV SV Index:   41 LVOT Area:     3.80 cm  RIGHT VENTRICLE RV S prime:     9.36 cm/s TAPSE (M-mode): 2.4 cm LEFT ATRIUM              Index       RIGHT ATRIUM           Index LA diam:        2.90 cm 1.45 cm/m  RA Area:     25.70 cm LA Vol (A2C):   79.6 ml 39.81 ml/m RA Volume:   71.90 ml  35.96 ml/m LA Vol (A4C):   49.8 ml 24.91 ml/m LA Biplane Vol: 67.8 ml 33.91 ml/m  AORTIC VALVE LVOT Vmax:   102.00 cm/s LVOT Vmean:  61.600 cm/s LVOT VTI:    0.216 m  AORTA Ao Root diam: 3.60 cm Ao Asc diam:  3.10 cm MITRAL VALVE               TRICUSPID VALVE MV Area (PHT): 4.31 cm    TR Peak grad:   22.1 mmHg MV Decel Time: 176 msec    TR Vmax:        235.00 cm/s MV E velocity: 99.80 cm/s MV A velocity: 32.60 cm/s  SHUNTS MV E/A ratio:  3.06        Systemic VTI:  0.22 m                            Systemic Diam: 2.20 cm Glori Bickers MD Electronically signed by Glori Bickers MD Signature Date/Time: 01/28/2020/2:18:14 PM    Final    IR PERCUTANEOUS ART THROMBECTOMY/INFUSION INTRACRANIAL INC DIAG ANGIO  Result Date: 02/01/2020 INDICATION: 64 year old male with past medical history significant for arcs is small atrial fibrillation on Xarelto, nonischemic cardiomyopathy and hypertension presenting with left course gaze and right hemiplegia, NIHSS 19. He was last seen well at 13:00 on 01/27/2020. Premorbid modified Rankin  scale 0. Head CT showed no evidence of a large territorial infarct. CT angiogram of the head and neck showed a distal left M1/MCA occlusion and a left A2-A3/ACA occlusion no intravenous tPA given. He was taken to our service for emergency mechanical thrombectomy. EXAM: Diagnostic cerebral angiogram and mechanical thrombectomy. COMPARISON:  CT/CT angiogram of the head and neck January 27, 2020. MEDICATIONS: No antibiotics given. ANESTHESIA/SEDATION: The procedure was performed under general anesthesia. FLUOROSCOPY TIME:  Fluoroscopy Time: 46 minutes 36 seconds (781 mGy). COMPLICATIONS: None immediate. TECHNIQUE: Informed written consent was obtained from the patient's next of kin after a thorough discussion of the procedural risks,  benefits and alternatives. All questions were addressed. Maximal Sterile Barrier Technique was utilized including caps, mask, sterile gowns, sterile gloves, sterile drape, hand hygiene and skin antiseptic. A timeout was performed prior to the initiation of the procedure. The right groin was prepped and draped in the usual sterile fashion. Using a micropuncture kit and the modified Seldinger technique, access was gained to the right common femoral artery and an 8 French sheath was placed. Under fluoroscopy, a Zoom 88 guide catheter was navigated over a 6 [place] Berenstein 2 catheter and a 0.035 inch Terumo Glidewire into the aortic arch. The catheter was placed into the left common carotid artery and then advanced into the left internal carotid artery. The inner catheter was removed. Frontal and lateral angiograms of the head were obtained. FINDINGS: 1. Occlusion of the distal left M1/MCA. 2. Occlusion of the left pericallosal artery at the A3 segment. PROCEDURE: Under biplane roadmap, a Zoom 71 aspiration catheter was navigated over a synchro support microguidewire 2 into the left M1/MCA. The wire was removed. Aspiration catheter was connected to an aspiration and advanced to the occlusion level. Continuous aspiration was performed for 4 minutes. The aspiration catheter was then removed under constant aspiration. The guiding catheter was aspirated and brisk blood return was seen. Left ICA angiograms with frontal and lateral views of the head showed complete recanalization of the left MCA (TICI3). Under biplane roadmap, is some 35 aspiration catheter was navigated over and Aristotle 14 micro guidewire into the left pericallosal artery at the level of occlusion. Continuous aspiration was performed for 3 minutes. The aspiration catheter was then removed under constant aspiration. The guiding catheter was aspirated and brisk blood return was seen. Left ICA angiograms with frontal and lateral views of the head showed  persistent occlusion of the left pericallosal artery. In a similar fashion, a 2nd aspiration pass was performed in the left pericallosal artery. Left ICA angiograms with frontal and lateral views of the head showed persistent occlusion of the left pericallosal artery. Under biplane roadmap, a Zoom 71 aspiration catheter was navigated over a phenom 21 microcatheter and a Aristotle 14 microguidewire into the cavernous segment of the left ICA. The microcatheter was then navigated over the wire into the left A3/ACA. Then, a 3 mm solitaire stent retriever was deployed spanning the A3 segment. The device was allowed to intercalated with the clot for 4 minutes. The microcatheter was removed. The aspiration catheter was connected to a penumbra aspiration pump. The thrombectomy device and aspiration catheter were removed under constant aspiration. Follow-up left ICA angiogram showed complete recanalization of the left ACA (TICI 3). Flat panel CT of the head was obtained and post processed in a separate workstation with concurrent attending physician supervision. Selected images were sent to PACS. No evidence of hemorrhagic complication. Distantly 88 catheter was retracted to the level of the left common  carotid artery. Frontal and lateral angiograms of the neck were obtained. Increased tortuosity of the cervical left ICA was noted. Minimal atherosclerotic changes seen in the left carotid bulb. No stenosis. IMPRESSION: 1. Successful and uncomplicated mechanical thrombectomy for treatment of a distal left M1/MCA with direct contact aspiration. One is pass performed with complete recanalization (TICI 3). 2. Successful and uncomplicated mechanical thrombectomy for treatment of a left A3/ACA occlusion. Total of 2 direct contact aspiration passes and 1 stent retriever pass with complete recanalization (TICI 3). 3. No embolus to distal or new territory. 4. No hemorrhagic complication on postprocedural flat panel CT. PLAN: - Patient  will remain intubated due to concerns for aspiration. - SBP 120-140.- Bed rest 6 hours.- F/U MRI within 24h. Electronically Signed   By: Pedro Earls M.D.   On: 02/01/2020 16:57   CT HEAD CODE STROKE WO CONTRAST  Result Date: 01/27/2020 CLINICAL DATA:  Code stroke.  Stroke.  Last seen normal 1 hour ago. EXAM: CT HEAD WITHOUT CONTRAST TECHNIQUE: Contiguous axial images were obtained from the base of the skull through the vertex without intravenous contrast. COMPARISON:  None. FINDINGS: Brain: No evidence of acute infarction, hemorrhage, hydrocephalus, extra-axial collection or mass lesion/mass effect. Vascular: Hyperdense left M2 segment in the sylvian fissure compatible with acute thrombus. No other hyperdense vessel. Skull: Negative Sinuses/Orbits: Retention cyst left maxillary sinus. Negative orbit. Other: None ASPECTS ([place] Stroke Program Early CT Score) - Ganglionic level infarction (caudate, lentiform nuclei, internal capsule, insula, M1-M3 cortex): 7 - Supraganglionic infarction (M4-M6 cortex): 3 Total score (0-10 with 10 being normal): 10 IMPRESSION: 1. Hyperdense left M2 branch compatible with acute thrombus. No acute infarct or hemorrhage 2. ASPECTS is 10 3. These results were called by telephone at the time of interpretation on 01/27/2020 at 1:56 pm to provider Rory Percy , who verbally acknowledged these results. Electronically Signed   By: Franchot Gallo M.D.   On: 01/27/2020 13:57   CT ANGIO HEAD CODE STROKE  Result Date: 01/27/2020 CLINICAL DATA:  Stroke.  Aphasia. EXAM: CT ANGIOGRAPHY HEAD AND NECK TECHNIQUE: Multidetector CT imaging of the head and neck was performed using the standard protocol during bolus administration of intravenous contrast. Multiplanar CT image reconstructions and MIPs were obtained to evaluate the vascular anatomy. Carotid stenosis measurements (when applicable) are obtained utilizing NASCET criteria, using the distal internal carotid diameter as the  denominator. CONTRAST:  62m OMNIPAQUE IOHEXOL 350 MG/ML SOLN COMPARISON:  CT head 01/27/2020 FINDINGS: CTA NECK FINDINGS Aortic arch: 4 vessel arch. Left vertebral artery origin from the arch. Proximal great vessels widely patent. Minimal atherosclerotic disease in the aortic arch. Right carotid system: Right carotid widely patent without significant stenosis. Left carotid system: Mild atherosclerotic disease left carotid bifurcation without significant stenosis. Vertebral arteries: Both vertebral arteries patent to the basilar without significant stenosis. Skeleton: Cervical spondylosis.  No acute skeletal abnormality. Other neck: Negative for soft tissue mass or edema in the neck. Upper chest: Lung apices clear bilaterally. Review of the MIP images confirms the above findings CTA HEAD FINDINGS Anterior circulation: Mild atherosclerotic disease in the cavernous carotid bilaterally without significant stenosis. Occlusion of the left middle cerebral artery at the bifurcation. There is a small early branch on the left MCA which extends to the parietal lobe. There is clot within this vessel. However there is occlusion of the majority of the superior and inferior divisions of the left MCA . Minimal collateral flow on delayed imaging. Both anterior cerebral arteries widely patent without stenosis.  Right A1 segment is hypoplastic. Right middle cerebral artery widely patent without stenosis. Posterior circulation: Both vertebral arteries patent to the basilar. PICA patent bilaterally. Basilar widely patent. AICA, superior cerebellar, and posterior cerebral arteries patent bilaterally without stenosis. Fetal origin left posterior cerebral artery. Venous sinuses: Normal venous enhancement. Anatomic variants: None Review of the MIP images confirms the above findings IMPRESSION: 1. Acute occlusion left MCA bifurcation with little flow in the superior and inferior divisions left MCA. There is an early branch supplying a part  of the left parietal lobe. Minimal collateral flow. 2. No other intracranial stenosis or occlusion. 3. No significant carotid or vertebral artery stenosis in the neck. 4. These results were called by telephone at the time of interpretation on 01/27/2020 at 2:41 pm to provider Rory Percy , who verbally acknowledged these results. Electronically Signed   By: Franchot Gallo M.D.   On: 01/27/2020 14:41   CT ANGIO NECK CODE STROKE  Result Date: 01/27/2020 CLINICAL DATA:  Stroke.  Aphasia. EXAM: CT ANGIOGRAPHY HEAD AND NECK TECHNIQUE: Multidetector CT imaging of the head and neck was performed using the standard protocol during bolus administration of intravenous contrast. Multiplanar CT image reconstructions and MIPs were obtained to evaluate the vascular anatomy. Carotid stenosis measurements (when applicable) are obtained utilizing NASCET criteria, using the distal internal carotid diameter as the denominator. CONTRAST:  69m OMNIPAQUE IOHEXOL 350 MG/ML SOLN COMPARISON:  CT head 01/27/2020 FINDINGS: CTA NECK FINDINGS Aortic arch: 4 vessel arch. Left vertebral artery origin from the arch. Proximal great vessels widely patent. Minimal atherosclerotic disease in the aortic arch. Right carotid system: Right carotid widely patent without significant stenosis. Left carotid system: Mild atherosclerotic disease left carotid bifurcation without significant stenosis. Vertebral arteries: Both vertebral arteries patent to the basilar without significant stenosis. Skeleton: Cervical spondylosis.  No acute skeletal abnormality. Other neck: Negative for soft tissue mass or edema in the neck. Upper chest: Lung apices clear bilaterally. Review of the MIP images confirms the above findings CTA HEAD FINDINGS Anterior circulation: Mild atherosclerotic disease in the cavernous carotid bilaterally without significant stenosis. Occlusion of the left middle cerebral artery at the bifurcation. There is a small early branch on the left MCA which  extends to the parietal lobe. There is clot within this vessel. However there is occlusion of the majority of the superior and inferior divisions of the left MCA . Minimal collateral flow on delayed imaging. Both anterior cerebral arteries widely patent without stenosis. Right A1 segment is hypoplastic. Right middle cerebral artery widely patent without stenosis. Posterior circulation: Both vertebral arteries patent to the basilar. PICA patent bilaterally. Basilar widely patent. AICA, superior cerebellar, and posterior cerebral arteries patent bilaterally without stenosis. Fetal origin left posterior cerebral artery. Venous sinuses: Normal venous enhancement. Anatomic variants: None Review of the MIP images confirms the above findings IMPRESSION: 1. Acute occlusion left MCA bifurcation with little flow in the superior and inferior divisions left MCA. There is an early branch supplying a part of the left parietal lobe. Minimal collateral flow. 2. No other intracranial stenosis or occlusion. 3. No significant carotid or vertebral artery stenosis in the neck. 4. These results were called by telephone at the time of interpretation on 01/27/2020 at 2:41 pm to provider ARory Percy, who verbally acknowledged these results. Electronically Signed   By: CFranchot GalloM.D.   On: 01/27/2020 14:41       HISTORY OF PRESENT ILLNESS Dustin VOKESis a 64y.o. male with history of paroxysmal A. fib  on chronic Xarelto, nonischemic cardiomyopathy and hypertension.  Patient was at [facility] hardware today.  Patient was noted to come out of the bathroom and suddenly collapsed by bystanders.  EMS was called.  On arrival they noted that he had a left forced gaze and right hemiplegia.  Patient was also nauseous and vomiting.  At the bridge patient was able to follow commands such as closing his eyes and opening his eyes, making a fist.  Due to patient being on Xarelto he was unable to receive TPA, last dose likely the day prior at 6 pm.  CT  head was immediately obtained which did not show a bleed.  CTA of head and neck was immediately obtained and showed a dense left MCA.  Son was called, explained the emergent IR procedure, risks and benefits and consent was given.  At that point patient was immediately brought to the interventional radiology suite for thrombectomy. He was LKW: 1300 on  01/27/2020. Premorbid modified Rankin scale (mRS): 0. NIH stroke scale: 80   HOSPITAL COURSE [name] is a 63 y.o. male with history of paroxysmal A. fib on chronic Xarelto, nonischemic cardiomyopathy and hypertension presenting with L forced gaze and R hemiplegia with nausea and vomiting. Not a tPA candidate as on xarelto. Taken to IR for L MCA occlusion.   Stroke:   L MCA and ACA scattered infarcts s/p TICI3 revascularization of L MCA & A3, infarct embolic secondary to known AF on Xarelto Code Stroke CT head Hyperdense L M2. ASPECTS 10.    CTA head & neck L MCA bifurcation occlusion w/ little distal flow. Minimal collateral flow. Cerebral angio distal L M1 occlusion, distal L A3 occlusion. TICI 3 revascularization MCA and ACA  Post IR CT no hemorrhage MRI  L basal ganglia infarct w/ mild edema. L cingulate and L superior frontal lobe infarcts w/ mild cortical edema. Few punctate infarcts R cingulate. Petechial hemorrhage at caudate. Small R cavernous malformation.  MRA  L MCA and ACA open.  2D Echo - EF 55 - 60%. No cardiac source of emboli identified.  LDL 33 HgbA1c - 5.6  Xarelto daily prior to admission, now on aspirin 325 po. switch to eliquis at d/c Therapy recommendations: CIR Disposition:  CIR   Acute Respiratory Failure Possible Aspiration N&V in ED Intubated for IR, left intubated following given possible aspiration Off sedation Extubated 6/18 CXR unremarkable WBC 9.9   Atrial Fibrillation Home anticoagulation:  Xarelto (rivaroxaban) daily  Home meds:  metoprolol 100 daily, tikosyn 500 bid Not an AC at this time  given post IR petechial hemorrhage.  Now on ASA switch to eliquis upon d/c to rehab   Hypertension Home meds:  metoprolol 100 daily, valsartan 320 Treated w/ phenylephrine post IR BP goal < 160 Long-term BP goal normotensive   Hyperlipidemia Home meds:  crestor 10, resumed in hospital LDL 33, goal < 70 Continue statin at discharge   Dysphagia Secondary to stroke On cortrak  On TF @ 42 Cleared for D2 nectar diet Continue TF over night; if po intake ok, d/c tube and TF in am Speech on board   Other Stroke Risk Factors Advanced age NICM w/ EF 45% PTA  (EF 67 - 60% by echo this admission) - on tikosyn PTA - Dr. [name] pt - contacted Dr. [name] office and was told to re-start tikosyn at follow up with Dr. [name] after discharge.    Other Active Problems Hypokalemia - resolved    DISCHARGE EXAM Blood  pressure (!) 154/94, pulse 61, temperature 98.5 F (36.9 C), temperature source Oral, resp. rate 18, height '5\' 9"'  (1.753 m), weight 84 kg, SpO2 97 %. General - Well nourished, well developed   Ophthalmologic - fundi not visualized due to noncooperation.   Cardiovascular - irregularly irregular heart rate and rhythm   Neuro - extubated, awake alert, eyes open, expressive aphasia, nonverbal, not name or repeat. However, able to follow simple central and peripheral commands on the left. Eyes in mid position, EOMI. Able to blink to visual threat bilaterally, PERRL. Right facial droop.  Tongue protrusion midline. Left UE at least 4/5 and against gravity, LLE at least 3+/5. However, on pain stimulation, 2-/5 RUE and 0/5 RLE. DTR diminished and no babinski. Sensation, coordination not cooperative and gait not tested.   Discharge Diet  Dysphagia 2 nectar thick liquids ; plan continue TF over night. If ok po intake, d/c tube & TF in am  DISCHARGE PLAN Disposition:  Transfer to [place] for ongoing PT, OT and ST Eliquis (apixaban) daily for secondary stroke prevention   Continue TF over night; if po intake ok, d/c tube and TF in am Recommend ongoing stroke risk factor control by Primary Care Physician at time of discharge from inpatient rehabilitation. Follow-up PCP Patient, No Pcp Per in 2 weeks following discharge from rehab. Follow-up in [facility] in 4 weeks following discharge from rehab, office to schedule an appointment.   35 minutes were spent preparing discharge.  Burnetta Sabin, MSN, APRN, ANVP-BC, AGPCNP-BC Advanced Practice Stroke Nurse [place] for Schedule & Pager information 02/02/2020 3:50 PM  I have personally obtained history,examined this patient, reviewed notes, independently viewed imaging studies, participated in medical decision making and plan of care.ROS completed by me personally and pertinent positives fully documented  I have made any additions or clarifications directly to the above note. Agree with note above.    Antony Contras, MD Medical Director [facility] Pager: [phone] 02/02/2020 4:32 PM

## 2020-02-02 NOTE — Progress Notes (Signed)
STROKE TEAM PROGRESS NOTE   INTERVAL HISTORY Patient continues to have significant expressive aphasia and dense hemiplegia.  He is unable to swallow and has core track tube.  Speech therapy will evaluate him today.  Is awaiting bed in rehab.  Vital signs stable  Vitals:   02/02/20 0402 02/02/20 0730 02/02/20 0949 02/02/20 1316  BP: (!) 142/93 (!) 155/100 (!) 148/88 (!) 154/94  Pulse: (!) 59 75 64 61  Resp: 17 18  18   Temp: 98.1 F (36.7 C) 97.9 F (36.6 C)  98.5 F (36.9 C)  TempSrc: Oral Oral  Oral  SpO2: 95% 97%  97%  Weight:      Height:       CBC:  Recent Labs  Lab 01/27/20 1341 01/27/20 1346 02/01/20 0507 02/02/20 0558  WBC 9.9   < > 9.2 9.3  NEUTROABS 4.9  --   --   --   HGB 14.3   < > 13.4 13.8  HCT 43.4   < > 40.2 42.5  MCV 96.7   < > 95.0 97.9  PLT 242   < > 187 206   < > = values in this interval not displayed.   Basic Metabolic Panel:  Recent Labs  Lab 01/29/20 0545 01/29/20 0545 01/30/20 0547 01/30/20 0547 01/31/20 0519 01/31/20 0519 02/01/20 0507 02/02/20 0558  NA 139   < > 140   < > 139   < > 141 142  K 3.5   < > 4.1   < > 3.4*   < > 3.9 3.8  CL 109   < > 108   < > 104   < > 106 103  CO2 23   < > 25   < > 26   < > 27 30  GLUCOSE 125*   < > 138*   < > 139*   < > 137* 115*  BUN 16   < > 15   < > 14   < > 18 24*  CREATININE 0.97   < > 0.82   < > 0.80   < > 0.83 1.02  CALCIUM 8.0*   < > 8.5*   < > 8.6*   < > 8.8* 9.1  MG 1.9  --  2.2  --   --   --   --   --   PHOS 2.8   < > 1.9*   < > 3.2  --  3.8  --    < > = values in this interval not displayed.   Lipid Panel:     Component Value Date/Time   CHOL 125 01/28/2020 0716   CHOL 226 (H) 12/17/2019 0831   TRIG 298 (H) 01/28/2020 0716   HDL 32 (L) 01/28/2020 0716   HDL 39 (L) 12/17/2019 0831   CHOLHDL 3.9 01/28/2020 0716   VLDL 60 (H) 01/28/2020 0716   LDLCALC 33 01/28/2020 0716   LDLCALC 154 (H) 12/17/2019 0831   HgbA1c:  Lab Results  Component Value Date   HGBA1C 5.6 01/28/2020    IMAGING past 24 hours No results found.  PHYSICAL EXAM    General - Well nourished, well developed, extubated and tolerating well.  Ophthalmologic - fundi not visualized due to noncooperation.  Cardiovascular - irregularly irregular heart rate and rhythm  Neuro - extubated, awake alert, eyes open, expressive aphasia, nonverbal, not name or repeat. However, able to follow simple central and peripheral commands on the left. Eyes in mid position, EOMI. Able to blink  to visual threat bilaterally, PERRL. Right facial droop.  Tongue protrusion midline. Left UE at least 4/5 and against gravity, LLE at least 3+/5. However, on pain stimulation, 2-/5 RUE and 0/5 RLE. DTR diminished and no babinski. Sensation, coordination not cooperative and gait not tested.   ASSESSMENT/PLAN Mr. YAEL ANGERER is a 64 y.o. male with history of paroxysmal A. fib on chronic Xarelto, nonischemic cardiomyopathy and hypertension presenting with L forced gaze and R hemiplegia with nausea and vomiting. Not a tPA candidate as on xarelto. Taken to IR for L MCA occlusion.  Stroke:   L MCA and ACA scattered infarcts s/p TICI3 revascularization of L MCA & A3, infarct embolic secondary to known AF on Xarelto  Code Stroke CT head Hyperdense L M2. ASPECTS 10.     CTA head & neck L MCA bifurcation occlusion w/ little distal flow. Minimal collateral flow.  Cerebral angio distal L M1 occlusion, distal L A3 occlusion. TICI 3 revascularization MCA and ACA   Post IR CT no hemorrhage  MRI  L basal ganglia infarct w/ mild edema. L cingulate and L superior frontal lobe infarcts w/ mild cortical edema. Few punctate infarcts R cingulate. Petechial hemorrhage at caudate. Small R cavernous malformation.   MRA  L MCA and ACA open.   2D Echo - EF 55 - 60%. No cardiac source of emboli identified.   LDL 33  HgbA1c - 5.6   SCDs for VTE prophylaxis  Xarelto daily prior to admission, now on aspirin 325 po. Plan switch to eliquis    Therapy recommendations: CIR  Disposition:  Pending. Insurance auth for rehab underway  Acute Respiratory Failure Possible Aspiration  N&V in ED  Intubated for IR, left intubated following given possible aspiration  Off sedation  Extubated 6/18  CXR unremarkable  WBC 9.9  CCM on board  Atrial Fibrillation  Home anticoagulation:  Xarelto (rivaroxaban) daily  . Home meds:  metoprolol 100 daily, tikosyn 500 bid . Not an AC at this time given post IR petechial hemorrhage.  . Now on ASA . Plan switch to eliquis    Hypertension  Home meds:  metoprolol 100 daily, valsartan 320 . BP goal < 160 . Off phenylephrine  . Long-term BP goal normotensive  Hyperlipidemia  Home meds:  crestor 10, resumed in hospital  LDL 33, goal < 70  Continue statin at discharge  Dysphagia . Secondary to stroke . NPO . On cortrak   On TF @ 36 . Speech on board   Other Stroke Risk Factors  Advanced age  NICM w/ EF 45% PTA  (EF 65 - 60% by echo this admission) - on tikosyn PTA - Dr. [name] pt - contacted Dr. [name] office and was told to re-start tikosyn at follow up with Dr. [name] after discharge.   Other Active Problems  Hypokalemia - resolved  Hospital day # 6 He passed swallow eval by speech therapy today.  Recommend see his ability to swallow today and if he can swallow satisfactorily switch to Eliquis tomorrow.  Continue ongoing therapies and hopefully transfer to rehab today if bed available.  Discussed with patient and daughter and answered questions. Antony Contras, MD    To contact Stroke Continuity provider, please refer to [email or web address] After hours, contact General Neurology on call today situation patient thrombectomy with successful procedure did well initially then developed a hemorrhagic transformation significant edema he is actually I am thinking of

## 2020-02-02 NOTE — Progress Notes (Signed)
Modified Barium Swallow Progress Note  Patient Details  Name: Dustin Schneider MRN: [id number] Date of Birth: 1955/11/05  Today's Date: 02/02/2020  Modified Barium Swallow completed.  Full report located under Chart Review in the Imaging Section.  Brief recommendations include the following:  Clinical Impression  Pt demonstrates a primary oral dysphagia with right buccal weakness as well as instances of premature spillage over the base of tongue. This results in episodes of penetration and aspiration before the swallow with thin and nectar thick liquids. Pt silently aspirated thin with a straw (trace), but sensed aspiration with nectar straw and ejected it. A chin tuck was helpful with cup sips, but did not prevent one instance of aspiration with thin via straw. Pt is recommended to initiate a dys 2 diet with nectar thick liquids with potential upgrade to thin if he can demonstrate small cup sips with a chin tuck (already starting to carry this over).    Swallow Evaluation Recommendations       SLP Diet Recommendations: Dysphagia 2 (Fine chop) solids;Nectar thick liquid   Liquid Administration via: Cup;No straw   Medication Administration: Crushed with puree   Supervision: Staff to assist with self feeding   Compensations: Slow rate;Small sips/bites           Other Recommendations: Have oral suction available    Ashrith Sagan, Katherene Ponto 02/02/2020,12:43 PM

## 2020-02-02 NOTE — Progress Notes (Signed)
Patient arrived on unit, oriented to unit. Reviewed medicines, therapy schedule, rehab routine and plan of care. No complications noted at this time.  [place]

## 2020-02-02 NOTE — Progress Notes (Signed)
Cristina Gong, RN  Rehab Admission Coordinator  Physical Medicine and Rehabilitation  PMR Pre-admission     Signed  Date of Service:  02/02/2020  2:41 PM      Related encounter: ED to Hosp-Admission (Discharged) from 01/27/2020 in [facility]      Signed       Show:Clear all '[x]' Manual'[x]' Template'[x]' Copied  Added by: '[x]' Cristina Gong, RN  '[]' Hover for details PMR Admission Coordinator Pre-Admission Assessment   Patient: Dustin Schneider is an 64 y.o., male MRN: [id number] DOB: 1956-05-17 Height: '5\' 9"'  (175.3 cm) Weight: 84 kg                                                                                                                                                  Insurance Information HMO:     PPO:      PCP:      IPA:      80/20:      OTHER:  PRIMARY: State BCBS of [place]      Policy#: [id number]      Subscriber: pt CM Name: Levy Pupa      Phone#: [phone]     Fax#: [phone] Pre-Cert#: [id number] approved until 7/6 when updates are due      Employer: [employer] Benefits:  Phone #: [phone]     Name: 6/22 Eff. Date: 08/14/2019     Deduct: $1250      Out of Pocket Max: $4890      Life Max: none  CIR: $300 co pay per admit then covers 80%      SNF: 80% 100 days per calender year Outpatient: $26 or $52 per visit     Co-Pay: visits per medical neccesity Home Health: 80%      Co-Pay: visits per medical neccesity DME: 80%     Co-Pay: 20% Providers: in network  SECONDARY: Medicare part A only      Policy#: [id number]    active 03/14/2019   Financial Counselor:       Phone#:    The "Data Collection Information Summary" for patients in Inpatient Rehabilitation Facilities with attached "Privacy Act [place] Records" was provided and verbally reviewed with: Patient and Family   Emergency Contact Information         Contact Information     Name Relation Home Work Mobile    Carprnter,[name] Significant other Chalmers, [place] Son     [phone]       Current Medical History  Patient Admitting Diagnosis: CVA   History of Present Illness:   64 year old right-handed male with history of PAF maintained on chronic Xarelto as well as Tikosyn, nonischemic cardiomyopathy followed by Dr. [name] and hypertension. 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  Presented 01/27/2020 with acute onset of left forced gaze and right hemiplegia as well as aphasia.  Patient was also nauseous and vomiting.  CT of the head showed hyperdense left M2 branch compatible with acute thrombus.  No acute infarct or hemorrhage.  Patient did not receive TPA as was already on Xarelto chronically.  CTA of head and neck showed occlusion of left MCA bifurcation with little flow in the superior and inferior divisions left MCA.  No other intracranial stenosis or occlusion.  Underwent emergent left MCA and ACA revascularization 01/27/2020 per interventional radiology.  Follow-up MRI/MRA showed negative intracranial MRA patent left MCA and ACA.  Confluent left basal ganglia infarct with edema and mild swelling.  Heidelberg classification 1 a petechial hemorrhage at the caudate.  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  Echocardiogram with ejection fraction of 94% grade 2 diastolic dysfunction.  Presently on aspirin 325 mg daily for CVA prophylaxis and awaiting plan to possibly switch to Eliquis once patient is able to swallow..  Subcutaneous heparin for DVT prophylaxis.  Patient is n.p.o. with alternative means of nutritional support.  MBS 6/22/ with recommendations for Dysphagia 2 with nectar thin liquids. Meds crushed with puree.   Complete NIHSS TOTAL: 19 Glasgow Coma Scale Score: 11   Past Medical History      Past Medical History:  Diagnosis Date  . Hypertension    . NICM (nonischemic cardiomyopathy) ([place]) 06/15/2018    04/23/18: EF 45%, 09/15/18: NOrmal LVEF  .  Paroxysmal atrial fibrillation ([place]) 06/15/2018  . Visit for monitoring Tikosyn therapy 06/12/2018      Family History  family history includes Atrial fibrillation in his father; Glaucoma in his mother.   Prior Rehab/Hospitalizations:  Has the patient had prior rehab or hospitalizations prior to admission? Yes   Has the patient had major surgery during 100 days prior to admission? No   Current Medications    Current Facility-Administered Medications:  .   stroke: mapping our early stages of recovery book, , Does not apply, Once, Biby, Sharon L, NP .  0.9 %  sodium chloride infusion, , Intravenous, PRN, Donzetta Starch, NP, Stopped at 01/29/20 1640 .  acetaminophen (TYLENOL) tablet 650 mg, 650 mg, Oral, Q4H PRN **OR** acetaminophen (TYLENOL) 160 MG/5ML solution 650 mg, 650 mg, Per Tube, Q4H PRN, 650 mg at 01/30/20 2020 **OR** acetaminophen (TYLENOL) suppository 650 mg, 650 mg, Rectal, Q4H PRN, Burnetta Sabin L, NP .  aspirin tablet 325 mg, 325 mg, Per Tube, Daily, 325 mg at 02/02/20 0948 **OR** aspirin suppository 300 mg, 300 mg, Rectal, Daily, Biby, Sharon L, NP, 300 mg at 01/29/20 1009 .  bisacodyl (DULCOLAX) suppository 10 mg, 10 mg, Rectal, Daily PRN, Burnetta Sabin L, NP, 10 mg at 01/31/20 1729 .  chlorhexidine gluconate (MEDLINE KIT) (PERIDEX) 0.12 % solution 15 mL, 15 mL, Mouth Rinse, BID, Biby, Sharon L, NP, 15 mL at 02/02/20 0825 .  Chlorhexidine Gluconate Cloth 2 % PADS 6 each, 6 each, Topical, Daily, Donzetta Starch, NP, 6 each at 02/01/20 0803 .  feeding supplement (OSMOLITE 1.5 CAL) liquid 1,000 mL, 1,000 mL, Per Tube, Continuous, Biby, Sharon L, NP, Last Rate: 55 mL/hr at 02/02/20 1419, 1,000 mL at 02/02/20 1419 .  feeding supplement (PRO-STAT SUGAR FREE 64) liquid 30 mL, 30 mL, Per Tube, BID, Biby, Sharon L, NP, 30 mL at 02/02/20 0949 .  free water 30 mL, 30 mL, Per Tube, Q4H, Biby, Sharon L, NP, 30 mL at 02/02/20 1238 .  heparin injection 5,000 Units, 5,000 Units, Subcutaneous, Q8H,  Biby, Sharon L, NP, 5,000 Units at 02/02/20 1406 .  hydrALAZINE (APRESOLINE) injection 5 mg, 5 mg, Intravenous, Q6H PRN, Burnetta Sabin L, NP, 5 mg at 01/31/20 1944 .  irbesartan (AVAPRO) tablet 150 mg, 150 mg, Per Tube, Daily, Biby, Sharon L, NP, 150 mg at 02/02/20 0948 .  MEDLINE mouth rinse, 15 mL, Mouth Rinse, q12n4p, Biby, Sharon L, NP, 15 mL at 02/02/20 1238 .  metoprolol tartrate (LOPRESSOR) 25 mg/10 mL oral suspension 100 mg, 100 mg, Per Tube, BID, Biby, Sharon L, NP, 100 mg at 02/02/20 0949 .  pantoprazole sodium (PROTONIX) 40 mg/20 mL oral suspension 40 mg, 40 mg, Per Tube, Q1200, Burnetta Sabin L, NP, 40 mg at 02/02/20 1237 .  [employer], , Oral, PRN, Garvin Fila, MD .  rosuvastatin (CRESTOR) tablet 10 mg, 10 mg, Per Tube, Daily, Biby, Sharon L, NP, 10 mg at 02/02/20 0948 .  senna-docusate (Senokot-S) tablet 1 tablet, 1 tablet, Oral, QHS PRN, Donzetta Starch, NP   Patients Current Diet:     Diet Order                 DIET DYS 2 Room service appropriate? 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  Independent   Indoor Mobility: Did the patient need assistance with walking from room to room (with or without device)? Independent   Stairs: Did the patient need assistance with internal or external stairs (with or without device)? Independent   Functional  Cognition: Did the patient need help planning regular tasks such as shopping or remembering to take medications? Independent   Home Assistive Devices / Equipment Home Equipment: None   Prior Device Use: Indicate devices/aids used by the patient prior to current illness, exacerbation or injury? None of the above   Current Functional Level Cognition   Arousal/Alertness: Awake/alert Overall Cognitive Status: Difficult to assess Difficult to assess due to: Impaired communication Orientation Level: Other (comment) (UTA) General Comments: responds to yes/no questions well and consistently. Pt appears animated with expressions during session, and periods of laughing noted. Pt attempted to speak to PT multiple times during session, unable. Attention: Sustained Sustained Attention: Appears intact (during simple, functional tasks)    Extremity Assessment (includes Sensation/Coordination)   Upper Extremity Assessment: RUE deficits/detail RUE Deficits / Details: Pt with no AROM Rt UE, however, there was an increase in tone/tension when he was asked to move Rt UE  RUE Coordination: decreased fine motor, decreased gross motor  Lower Extremity Assessment: Defer to PT evaluation RLE Deficits / Details: withdraws to pain, increased knee flexor tone noted, PROM WFL, flickers of AROM noted at hip and with knee extensors     ADLs   Overall ADL's : Needs assistance/impaired Eating/Feeding: NPO Grooming: Wash/dry hands, Wash/dry face, Oral care,  Brushing hair, Moderate assistance, Sitting Grooming Details (indicate cue type and reason): Pt using yaunker mod I while in bed  Upper Body Bathing: Maximal assistance, Sitting Lower Body Bathing: Maximal assistance, Sit to/from stand Upper Body Dressing : Total assistance, Sitting Lower Body Dressing: Total assistance, Bed level Lower Body Dressing Details (indicate cue type and reason): to don new sock Toilet Transfer: Maximal assistance, +2 for  safety/equipment, +2 for physical assistance Toilet Transfer Details (indicate cue type and reason): simulated via functional mobility with lateral scoot to recliner' MAX A +2 Toileting- Clothing Manipulation and Hygiene: Total assistance, Sit to/from stand Functional mobility during ADLs: Maximal assistance, Total assistance, +2 for physical assistance, +2 for safety/equipment General ADL Comments: pt continues to present with aphasia     Mobility   Overal bed mobility: Needs Assistance Bed Mobility: Supine to Sit Supine to sit: Max assist, +2 for physical assistance, +2 for safety/equipment, HOB elevated General bed mobility comments: Max +2 for LE and truncal management, scooting to EOB with use of bed pads and lateral weight shifting. R lateral leaning, aided by pelvic facilitation and HOB elevation as barrier from falling R (pt sitting on R side of bed).     Transfers   Overall transfer level: Needs assistance Equipment used: 2 person hand held assist Transfers: Sit to/from Stand, Squat Pivot Transfers Sit to Stand: Max assist, +2 physical assistance, +2 safety/equipment, From elevated surface Stand pivot transfers: Max assist Squat pivot transfers: Max assist, +2 physical assistance, +2 safety/equipment, From elevated surface General transfer comment: Max +2 for sit to stand for trunk elevation, R knee blocking, and upright posture once standing. Pt attempting to step when pt very unsteady, aborted stand pivot and opted for swing pivot to drop arm recliner towards L. Max +2 for squat pivot for body translation to chair, steadying, safe slow eccentric lower into chair, and scooting back in recliner with use of bed pads once seated.     Ambulation / Gait / Stairs / Wheelchair Mobility   Ambulation/Gait General Gait Details: unable this day     Posture / Balance Dynamic Sitting Balance Sitting balance - Comments: R lateral leaning, require posterior truncal assist and pelvic facilitation  for postural correction. Pt responds well to visual cuing of midline to correct own posture as well. Balance Overall balance assessment: Needs assistance Sitting-balance support: Single extremity supported, Feet supported Sitting balance-Leahy Scale: Poor Sitting balance - Comments: R lateral leaning, require posterior truncal assist and pelvic facilitation for postural correction. Pt responds well to visual cuing of midline to correct own posture as well. Postural control: Right lateral lean Standing balance support: Bilateral upper extremity supported Standing balance-Leahy Scale: Zero Standing balance comment: maxA to maintain standing due to strong R lateral lean     Special needs/care consideration Cortrak placed 6/18 43 inches 10 fr left nare MBS 6/22 with diet upgrade Designated visitors are significant other, [name] and brother in Sports coach Dr. Rosana Fret      Previous Home Environment  Living Arrangements: Alone  Lives With: Alone Available Help at Discharge: Family, Friend(s), Available 24 hours/day [name], son [name] and Interior and spatial designer primary caregivers with o) Type of Home: House Home Layout: One level Home Access: Stairs to enter Entrance Stairs-Rails: None Entrance Stairs-Number of Steps: 1 Bathroom Shower/Tub:  ([name] working on Building services engineer while patient at [employer] small bat) Bathroom Toilet: Standard Bathroom Accessibility: Yes How Accessible: Accessible via walker (not easily accessible with wheelchair so to be redone asap) Home Care Services:  No   Discharge Living Setting Plans for Discharge Living Setting: Patient's home, Alone Type of Home at Discharge: House Discharge Home Layout: One level Discharge Home Access: Stairs to enter Entrance Stairs-Rails: None Entrance Stairs-Number of Steps: 1 Discharge Bathroom Shower/Tub: Tub/shower unit, Curtain (bathroom to be remodeled asap) Discharge Bathroom Toilet: Standard Discharge Bathroom Accessibility: Yes How  Accessible: Accessible via walker Does the patient have any problems obtaining your medications?: No   Social/Family/Support Systems Patient Roles: Partner, Parent (works a and Designer, television/film set and also Development worker, community with [employer] b) Contact Information: [name], significant other, son [name], sister Belenda Cruise and her husband [name] Anticipated Caregiver: Jhonnie Garner and friends. Also to hire caregivers Anticipated Caregiver's Contact Information: see above Ability/Limitations of Caregiver: [name] works as a Surveyor, minerals, son , [name] works, Belenda Cruise can come form Games developer and hired caregivers Caregiver Availability: 24/7 Discharge Plan Discussed with Primary Caregiver: Yes Is Caregiver In Agreement with Plan?: Yes Does Caregiver/Family have Issues with Lodging/Transportation while Pt is in Rehab?: No   Goals Patient/Family Goal for Rehab: min assist PT, OT, and SLP  Expected length of stay: ELOS 2 to 3 weeks Additional Information: I have advised family to get lawyer to assist with financial and [facility] POA designations Pt/Family Agrees to Admission and willing to participate: Yes Program Orientation Provided & Reviewed with Pt/Caregiver Including Roles  & Responsibilities: Yes   Decrease burden of Care through IP rehab admission: n/a   Possible need for SNF placement upon discharge:not anticipated   Patient Condition: This patient's medical and functional status has changed since the consult dated: 01/30/2020 in which the Rehabilitation Physician determined and documented that the patient's condition is appropriate for intensive rehabilitative care in an inpatient rehabilitation facility. See "History of Present Illness" (above) for medical update. Functional changes are: overall max assist. Patient's medical and functional status update has been discussed with the Rehabilitation physician and patient remains appropriate for inpatient rehabilitation. Will admit to inpatient rehab today.     Preadmission Screen Completed By:  Cleatrice Burke, RN, 02/02/2020 3:11 PM ______________________________________________________________________   Discussed status with Dr. Ranell Patrick on 02/02/2020 at  1527 and received approval for admission today.   Admission Coordinator:  Cleatrice Burke, time 2130 Date 02/02/2020             Cosigned by: Izora Ribas, MD at 02/02/2020  3:31 PM  Revision History Note Details  Author Cristina Gong, RN File Time 02/02/2020  3:28 PM  Author Type Rehab Admission Coordinator Status Signed  Last Editor Cristina Gong, RN Service Physical Medicine and [place] # [id number] Admit Date 02/02/2020

## 2020-02-02 NOTE — Progress Notes (Signed)
Physical Therapy Treatment Patient Details Name: Dustin Schneider MRN: [id number] DOB: 13-Mar-1956 Today's Date: 02/02/2020    History of Present Illness 64 y.o. male with history of paroxysmal A. fib on chronic Xarelto, nonischemic cardiomyopathy and hypertension.  Patient was at [facility] hardware today.  Patient was noted to come out of the bathroom and suddenly collapsed by bystanders. On arrival they noted that he had a left forced gaze and right hemiplegia.  Patient was also nauseous and vomiting. of head and neck was immediately obtained and showed a dense left MCA. Pt underwent emergent L MCA and ACA revascularization on 6/16.    PT Comments    Pt had just gotten back from swallow study, eager to get out of bed. Pt required max +2 for bed mobility and transfer OOB to drop arm recliner via scoot pivot, PT and OT trialling stand but required total +2 with bilateral knee blocking and significant truncal assist to maintain static stand only. Pt speaking to PT and OT today, stating "yes" and "no" consistently, appears to get frustrated when trying to state something other than yes/no. Pt encouraged to continue speaking attempts to family and staff.     Follow Up Recommendations  CIR     Equipment Recommendations  Wheelchair (measurements PT);Wheelchair cushion (measurements PT);Hospital bed    Recommendations for Other Services       Precautions / Restrictions Precautions Precautions: Fall Restrictions Weight Bearing Restrictions: No    Mobility  Bed Mobility Overal bed mobility: Needs Assistance Bed Mobility: Supine to Sit     Supine to sit: Max assist;+2 for physical assistance;+2 for safety/equipment;HOB elevated     General bed mobility comments: Max +2 for RLE translation to EOB, but good strength noted in LLE noted to be able to advance LLE to EOB. Pt required MAX A +2 to elevate trunk and scoot hips to EOB. utilized elevated HOB on R to create lateral support if  needed  Transfers Overall transfer level: Needs assistance Equipment used: 2 person hand held assist Transfers: Lateral/Scoot Transfers          Lateral/Scoot Transfers: Max assist;+2 physical assistance;+2 safety/equipment General transfer comment: MAX A +2 to scoot to recliner to pts L side with use of bed pads to translate hips. Pt required tactile and verbal cues to shift weight anteriorly to off weight hips to scoot. Pt with use of LUE to reach to recliner, attempts to readjust self in chair without assist although he needs it.  Ambulation/Gait             General Gait Details: unable this day   Stairs             Wheelchair Mobility    Modified Rankin (Stroke Patients Only) Modified Rankin (Stroke Patients Only) Pre-Morbid Rankin Score: No symptoms Modified Rankin: Severe disability     Balance Overall balance assessment: Needs assistance Sitting-balance support: Single extremity supported;Feet supported Sitting balance-Leahy Scale: Poor Sitting balance - Comments: R lateral lean is pt preference, able to correct with visual midline cuing and at times needs min-mod assist to correct. Postural control: Right lateral lean   Standing balance-Leahy Scale: Zero                              Cognition Arousal/Alertness: Awake/alert Behavior During Therapy: WFL for tasks assessed/performed Overall Cognitive Status: Difficult to assess  General Comments: Sherrin Daisy, pt more engaged this session able to start to form sentences stating, " I could.." but unable to progress language more than a couple words at a time. Pt however nods appropriately and able to state "yes" and "no" with increased time and effort noted.      Exercises Other Exercises Other Exercises: Dynamic seated balance: AP leaning x3 repetitions each direction, pt cued to lean only as far as he could correct struggling more with  correcting posterior than anterior lean. Intermittent cues for righting posture to midline from R lateral lean    General Comments        Pertinent Vitals/Pain Pain Assessment: Faces Faces Pain Scale: Hurts a little bit Pain Location: abdomen Pain Descriptors / Indicators: Tightness;Other (Comment) (indigestion) Pain Intervention(s): Limited activity within patient's tolerance;Monitored during session;Repositioned    Home Living     Available Help at Discharge: Family;Friend(s);Available 24 hours/day [name], son [name] and Interior and spatial designer primary caregivers with o)       Home Layout: One level        Prior Function            PT Goals (current goals can now be found in the care plan section) Acute Rehab PT Goals Patient Stated Goal: to be able to sculpt again  PT Goal Formulation: With patient Time For Goal Achievement: 02/12/20 Potential to Achieve Goals: Fair Progress towards PT goals: Progressing toward goals    Frequency    Min 4X/week      PT Plan Current plan remains appropriate    Co-evaluation PT/OT/SLP Co-Evaluation/Treatment: Yes Reason for Co-Treatment: For patient/therapist safety;To address functional/ADL transfers;Complexity of the patient's impairments (multi-system involvement) PT goals addressed during session: Mobility/safety with mobility;Balance;Strengthening/ROM        AM-PAC PT "6 Clicks" Mobility   Outcome Measure  Help needed turning from your back to your side while in a flat bed without using bedrails?: A Lot Help needed moving from lying on your back to sitting on the side of a flat bed without using bedrails?: Total Help needed moving to and from a bed to a chair (including a wheelchair)?: Total Help needed standing up from a chair using your arms (e.g., wheelchair or bedside chair)?: Total Help needed to walk in hospital room?: Total Help needed climbing 3-5 steps with a railing? : Total 6 Click Score: 7    End of Session  Equipment Utilized During Treatment: Gait belt Activity Tolerance: Patient tolerated treatment well Patient left: in chair;with chair alarm set;with call bell/phone within reach Nurse Communication: Mobility status;Need for lift equipment (maximove pad placed) PT Visit Diagnosis: Other abnormalities of gait and mobility (R26.89);Muscle weakness (generalized) (M62.81);Other symptoms and signs involving the nervous system (R29.898)     Time: 6967-8938 PT Time Calculation (min) (ACUTE ONLY): 31 min  Charges:  $Neuromuscular Re-education: 8-22 mins                    Adonna Horsley E, PT Acute Rehabilitation Services Pager [phone]  Office [phone]  Tinita Brooker D Elonda Husky 02/02/2020, 4:02 PM

## 2020-02-02 NOTE — IPOC Note (Signed)
Individualized overall Plan of Care [facility]) Patient Details Name: Dustin Schneider MRN: [id number] DOB: Dec 24, 1955  Admitting Diagnosis: Acute ischemic left MCA stroke [facility])  Hospital Problems: Principal Problem:   Acute ischemic left MCA stroke ([place]) Active Problems:   AKI (acute kidney injury) ([place])   Global aphasia   Leukocytosis   Hypoalbuminemia due to protein-calorie malnutrition ([place])   Hyperglycemia   Dysphagia, post-stroke   PAF (paroxysmal atrial fibrillation) ([place])     Functional Problem List: Nursing Bladder, Bowel, Medication Management, Motor, Safety, Sensory  PT Balance, Endurance, Motor, Perception, Safety, Sensory  OT Balance, Cognition, Endurance, Motor, Pain, Perception, Safety, Sensory, Vision  SLP Cognition, Linguistic, Nutrition  TR         Basic ADL's: OT Grooming, Bathing, Dressing, Toileting, Eating     Advanced  ADL's: OT       Transfers: PT Bed Mobility, Bed to Chair, Car, Manufacturing systems engineer, Metallurgist: PT Ambulation, Emergency planning/management officer, Stairs     Additional Impairments: OT Fuctional Use of Upper Extremity  SLP Swallowing, Communication, Social Cognition expression Problem Solving, Awareness  TR      Anticipated Outcomes Item Anticipated Outcome  Self Feeding S  Swallowing  Mod I   Basic self-care  MIN A  Toileting  MIN A   Bathroom Transfers MIN A  Bowel/Bladder  patient will be able to have elimination needs met by discharge  Transfers  min A with LRAD  Locomotion  min A with LRAD  Communication  Min A  Cognition  Supervision A  Pain  patient's pain will be managed to an acceptable level by discharge  Safety/Judgment  patient will have no falls with injury while on CIR   Therapy Plan: PT Intensity: Minimum of 1-2 x/day ,45 to 90 minutes PT Frequency: 5 out of 7 days PT Duration Estimated Length of Stay: 3-4 weeks OT Intensity: Minimum of 1-2 x/day, 45 to 90 minutes OT Frequency: 5 out of 7  days OT Duration/Estimated Length of Stay: 24-28 SLP Intensity: Minumum of 1-2 x/day, 30 to 90 minutes SLP Frequency: 3 to 5 out of 7 days SLP Duration/Estimated Length of Stay: 3-4 weeks    Team Interventions: Nursing Interventions    PT interventions Ambulation/gait training, Discharge planning, Functional mobility training, Psychosocial support, Therapeutic Activities, Balance/vestibular training, Disease management/prevention, Neuromuscular re-education, Skin care/wound management, Therapeutic Exercise, Wheelchair propulsion/positioning, Cognitive remediation/compensation, DME/adaptive equipment instruction, Pain management, Splinting/orthotics, UE/LE Strength taining/ROM, Community reintegration, Technical sales engineer stimulation, Barrister's clerk education, IT trainer, UE/LE Coordination activities  OT Interventions Training and development officer, Discharge planning, Functional electrical stimulation, Pain management, Self Care/advanced ADL retraining, Therapeutic Activities, UE/LE Coordination activities, Cognitive remediation/compensation, Disease mangement/prevention, Functional mobility training, Patient/family education, Therapeutic Exercise, Skin care/wound managment, Visual/perceptual remediation/compensation, Wheelchair propulsion/positioning, UE/LE Strength taining/ROM, Splinting/orthotics, Psychosocial support, Neuromuscular re-education, DME/adaptive equipment instruction, Community reintegration  SLP Interventions Cognitive remediation/compensation, English as a second language teacher, Dysphagia/aspiration precaution training, Functional tasks, Patient/family education, Therapeutic Activities, Speech/Language facilitation, Multimodal communication approach, Internal/external aids  TR Interventions    SW/CM Interventions Discharge Planning, Psychosocial Support, Patient/Family Education   Barriers to Discharge MD  Medical stability, Incontinence, Behavior and Nutritional means  Nursing      PT Medical  stability, Home environment access/layout denise R hemiparesis, aphaisa, pusher  OT Decreased caregiver support, Inaccessible home environment, Lack of/limited family support    SLP      SW       Team Discharge Planning: Destination: PT-Home ,OT-   , SLP-Home Projected Follow-up: PT-Home health PT, OT-  Home health OT, SLP-Outpatient SLP, 24 hour supervision/assistance, Home Health SLP Projected Equipment Needs: PT-To be determined, OT- To be determined, 3 in 1 bedside comode, Tub/shower bench, SLP-None recommended by SLP Equipment Details: PT-has none, OT-  Patient/family involved in discharge planning: PT- Patient,  OT-Patient unable/family or caregiver not available, SLP-Patient, Family member/caregiver  MD ELOS: 18-22 days. Medical Rehab Prognosis:  Good Assessment: 64 year old male with history of PAF- on Xarelto, NICM; who was admitted on 01/27/20 after collapsing at [facility] hardware. He was found to have right sided weakness with left gaze preference as well as N/V. CT head done at admission revealing hyperdense sign left M2 branch c/w acute thrombus. He underwent cerebral angio with complete recanalization of L-MCA and ACA. 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  2D echo showed EF 55-60% with moderate biatrial dilatation, mild MVR and grade  2 DD.   He tolerated extubation on 06/18 and made NPO due to facial weakness with signs of dysphagia and risk for silent aspiration. Patient with dense right hemiplegia with aphasia. Cortak placed and he was started on tube feeds. He was maintained on ASA given IR petechial hemorrhage and transitioned to Eliquis 06/22. He continues to be limited by right sided weakness with right lean, apraxia with limited verbal  output and needs multimodal cues to follow simple commands. Will set goals for Supervision/Min A with PT/OT/SLP.  Due to the current state of emergency, patients may not be receiving their 3-hours of Medicare-mandated therapy.  See Team Conference Notes for weekly updates to the plan of care

## 2020-02-02 NOTE — Progress Notes (Signed)
Inpatient Rehabilitation Admissions Coordinator  I met with patient and his significant other, [name]. They are aware that I have received Insurance approval for CIR but await bed availability. I will follow up tomorrow.  Danne Baxter, RN, MSN Rehab Admissions Coordinator [phone] 02/02/2020 12:47 PM

## 2020-02-02 NOTE — Progress Notes (Signed)
Inpatient Rehabilitation Medication Review by a Pharmacist  A complete drug regimen review was completed for this patient to identify any potential clinically significant medication issues.  Clinically significant medication issues were identified: No  Time spent performing this drug regimen review (minutes):  10  Gillermina Hu, PharmD, BCPS, [facility] Clinical Pharmacist 02/02/2020 5:35 PM

## 2020-02-03 ENCOUNTER — Inpatient Hospital Stay (HOSPITAL_COMMUNITY): Payer: BC Managed Care – PPO

## 2020-02-03 ENCOUNTER — Inpatient Hospital Stay (HOSPITAL_COMMUNITY): Payer: BC Managed Care – PPO | Admitting: Speech Pathology

## 2020-02-03 DIAGNOSIS — R4701 Aphasia: Secondary | ICD-10-CM

## 2020-02-03 DIAGNOSIS — I69391 Dysphagia following cerebral infarction: Secondary | ICD-10-CM

## 2020-02-03 DIAGNOSIS — D72829 Elevated white blood cell count, unspecified: Secondary | ICD-10-CM

## 2020-02-03 DIAGNOSIS — I480 Paroxysmal atrial fibrillation: Secondary | ICD-10-CM

## 2020-02-03 DIAGNOSIS — E46 Unspecified protein-calorie malnutrition: Secondary | ICD-10-CM

## 2020-02-03 DIAGNOSIS — R739 Hyperglycemia, unspecified: Secondary | ICD-10-CM

## 2020-02-03 DIAGNOSIS — I10 Essential (primary) hypertension: Secondary | ICD-10-CM

## 2020-02-03 DIAGNOSIS — E8809 Other disorders of plasma-protein metabolism, not elsewhere classified: Secondary | ICD-10-CM

## 2020-02-03 DIAGNOSIS — N179 Acute kidney failure, unspecified: Secondary | ICD-10-CM

## 2020-02-03 LAB — CBC WITH DIFFERENTIAL/PLATELET
Abs Immature Granulocytes: 0.05 10*3/uL (ref 0.00–0.07)
Basophils Absolute: 0.1 10*3/uL (ref 0.0–0.1)
Basophils Relative: 1 %
Eosinophils Absolute: 0.3 10*3/uL (ref 0.0–0.5)
Eosinophils Relative: 2 %
HCT: 42 % (ref 39.0–52.0)
Hemoglobin: 13.7 g/dL (ref 13.0–17.0)
Immature Granulocytes: 0 %
Lymphocytes Relative: 13 %
Lymphs Abs: 1.5 10*3/uL (ref 0.7–4.0)
MCH: 31.9 pg (ref 26.0–34.0)
MCHC: 32.6 g/dL (ref 30.0–36.0)
MCV: 97.9 fL (ref 80.0–100.0)
Monocytes Absolute: 1.2 10*3/uL — ABNORMAL HIGH (ref 0.1–1.0)
Monocytes Relative: 10 %
Neutro Abs: 8.4 10*3/uL — ABNORMAL HIGH (ref 1.7–7.7)
Neutrophils Relative %: 74 %
Platelets: 214 10*3/uL (ref 150–400)
RBC: 4.29 MIL/uL (ref 4.22–5.81)
RDW: 13.8 % (ref 11.5–15.5)
WBC: 11.5 10*3/uL — ABNORMAL HIGH (ref 4.0–10.5)
nRBC: 0 % (ref 0.0–0.2)

## 2020-02-03 LAB — COMPREHENSIVE METABOLIC PANEL
ALT: 85 U/L — ABNORMAL HIGH (ref 0–44)
AST: 38 U/L (ref 15–41)
Albumin: 3.2 g/dL — ABNORMAL LOW (ref 3.5–5.0)
Alkaline Phosphatase: 73 U/L (ref 38–126)
Anion gap: 10 (ref 5–15)
BUN: 28 mg/dL — ABNORMAL HIGH (ref 8–23)
CO2: 30 mmol/L (ref 22–32)
Calcium: 9.2 mg/dL (ref 8.9–10.3)
Chloride: 101 mmol/L (ref 98–111)
Creatinine, Ser: 1.14 mg/dL (ref 0.61–1.24)
GFR calc Af Amer: >60 mL/min (ref >60)
GFR calc non Af Amer: >60 mL/min (ref >60)
Glucose, Bld: 118 mg/dL — ABNORMAL HIGH (ref 70–99)
Potassium: 4.8 mmol/L (ref 3.5–5.1)
Sodium: 141 mmol/L (ref 135–145)
Total Bilirubin: 0.8 mg/dL (ref 0.3–1.2)
Total Protein: 6 g/dL — ABNORMAL LOW (ref 6.5–8.1)

## 2020-02-03 MED ORDER — PRO-STAT SUGAR FREE PO LIQD
30.0000 mL | Freq: Two times a day (BID) | ORAL | Status: DC
  Administered 2020-02-03 – 2020-03-01 (×51): 30 mL via ORAL
  Filled 2020-02-03 (×56): qty 30

## 2020-02-03 MED ORDER — STROKE: EARLY STAGES OF RECOVERY BOOK
Freq: Once | Status: AC
  Filled 2020-02-03: qty 1

## 2020-02-03 MED ORDER — ROSUVASTATIN CALCIUM 5 MG PO TABS
10.0000 mg | ORAL_TABLET | Freq: Every day | ORAL | Status: DC
  Administered 2020-02-03 – 2020-03-01 (×28): 10 mg via ORAL
  Filled 2020-02-03 (×29): qty 2

## 2020-02-03 MED ORDER — OFF THE BEAT BOOK
Freq: Once | Status: AC
  Filled 2020-02-03: qty 1

## 2020-02-03 MED ORDER — METOPROLOL TARTRATE 25 MG/10 ML ORAL SUSPENSION
100.0000 mg | Freq: Two times a day (BID) | ORAL | Status: DC
  Administered 2020-02-03 – 2020-02-12 (×18): 100 mg via ORAL
  Filled 2020-02-03 (×21): qty 40

## 2020-02-03 MED ORDER — IRBESARTAN 75 MG PO TABS
150.0000 mg | ORAL_TABLET | Freq: Every day | ORAL | Status: DC
  Administered 2020-02-04 – 2020-02-11 (×8): 150 mg via ORAL
  Filled 2020-02-03 (×8): qty 2

## 2020-02-03 NOTE — Progress Notes (Signed)
[facility] Individual Statement of Services  Patient Name:  FOWLER ANTOS  Date:  02/03/2020  Welcome to the [facility].  Our goal is to provide you with an individualized program based on your diagnosis and situation, designed to meet your specific needs.  With this comprehensive rehabilitation program, you will be expected to participate in at least 3 hours of rehabilitation therapies Monday-Friday, with modified therapy programming on the weekends.  Your rehabilitation program will include the following services:  Physical Therapy (PT), Occupational Therapy (OT), Speech Therapy (ST), 24 hour per day rehabilitation nursing, Therapeutic Recreaction (TR), Neuropsychology, Care Coordinator, Rehabilitation Medicine, Nutrition Services, Pharmacy Services and Other  Weekly team conferences will be held on Wednesdays to discuss your progress.  Your Inpatient Rehabilitation Care Coordinator will talk with you frequently to get your input and to update you on team discussions.  Team conferences with you and your family in attendance may also be held.  Expected length of stay: 2-3 Weeks  Overall anticipated outcome: Min A  Depending on your progress and recovery, your program may change. Your Inpatient Rehabilitation Care Coordinator will coordinate services and will keep you informed of any changes. Your Inpatient Rehabilitation Care Coordinator's name and contact numbers are listed  below.  The following services may also be recommended but are not provided by the [facility]:    [place] will be made to provide these services after discharge if needed.  Arrangements include referral to agencies that provide these services.  Your insurance has been verified to be:  [facility] Your primary doctor is:  [name], MD  Pertinent information will be  shared with your doctor and your insurance company.  Inpatient Rehabilitation Care Coordinator:  Erlene Quan, [place] or [phone]  Information discussed with and copy given to patient by: Dyanne Iha, 02/03/2020, 9:05 AM

## 2020-02-03 NOTE — Progress Notes (Signed)
Inpatient Rehabilitation  Patient information reviewed and entered into eRehab system by Kourtnie Sachs M. Karla Pavone, M.A., CCC/SLP, PPS Coordinator.  Information including medical coding, functional ability and quality indicators will be reviewed and updated through discharge.    

## 2020-02-03 NOTE — Evaluation (Signed)
Occupational Therapy Assessment and Plan  Patient Details  Name: Dustin Schneider MRN: [id number] Date of Birth: 24-Dec-1955  OT Diagnosis: abnormal posture, acute pain, apraxia, cognitive deficits, disturbance of vision, flaccid hemiplegia and hemiparesis, hemiplegia affecting dominant side and muscle weakness (generalized) Rehab Potential:   ELOS: 24-28   Today's Date: 02/03/2020 OT Individual Time: 6269-4854 OT Individual Time Calculation (min): 75 min     Hospital Problem: Principal Problem:   Acute ischemic left MCA stroke [facility])   Past Medical History:  Past Medical History:  Diagnosis Date  . Hypertension   . NICM (nonischemic cardiomyopathy) ([place]) 06/15/2018   04/23/18: EF 45%, 09/15/18: NOrmal LVEF  . Paroxysmal atrial fibrillation ([place]) 06/15/2018  . Visit for monitoring Tikosyn therapy 06/12/2018   Past Surgical History:  Past Surgical History:  Procedure Laterality Date  . CARDIOVERSION N/A 05/13/2018   Procedure: CARDIOVERSION;  Surgeon: [name], MD;  Location: [facility] ENDOSCOPY;  Service: Cardiovascular;  Laterality: N/A;  . CARDIOVERSION N/A 06/13/2018   Procedure: CARDIOVERSION;  Surgeon: [name], MD;  Location: [facility] ENDOSCOPY;  Service: Cardiovascular;  Laterality: N/A;  . CARDIOVERSION N/A 06/23/2018   Procedure: CARDIOVERSION;  Surgeon: [name], MD;  Location: [place];  Service: Cardiovascular;  Laterality: N/A;  . excision of actinic keratosis    . EYE SURGERY     eye lift  . IR CT HEAD LTD  01/27/2020  . IR PERCUTANEOUS ART THROMBECTOMY/INFUSION INTRACRANIAL INC DIAG ANGIO  01/27/2020      . IR PERCUTANEOUS ART THROMBECTOMY/INFUSION INTRACRANIAL INC DIAG ANGIO  01/27/2020  . RADIOLOGY WITH ANESTHESIA N/A 01/27/2020   Procedure: IR WITH ANESTHESIA;  Surgeon: Radiologist, Medication, MD;  Location: [place];  Service: Radiology;  Laterality: N/A;  . TONSILLECTOMY    . WISDOM TOOTH EXTRACTION      Assessment & Plan Clinical Impression:  Dustin Schneider  is a 64 year old male with history of PAF- on Xarelto, NICM; who was admitted on 01/27/20 after collapsing at [facility] hardware. He was found to have right sided weakness with left gaze preference as well as N/V. CT head done at admission revealing hyperdense sign left M2 branch c/w acute thrombus. He underwent cerebral angio with complete recanalization of L-MCA and ACA. 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  2D echo showed EF 55-60% with moderate biatrial dilatation, mild MVR and grade  2 DD.   He tolerated extubation on 06/18 and made NPO due to facial weakness with signs of dysphagia and risk for silent aspiration. Patient with dense right hemiplegia with aphasia. Cortak placed and he was started on tube feeds.   He was maintained on ASA given IR petechial hemorrhage and transitioned to Eliquis 06/22. He continues to be limited by right sided weakness with right lean, apraxia with limited verbal output and needs multimodal cues to follow simple commands.   Patient currently requires total with basic self-care skills secondary to muscle weakness, decreased cardiorespiratoy endurance, impaired timing and sequencing, abnormal tone, unbalanced muscle activation, decreased coordination and decreased motor planning, decreased visual perceptual skills, decreased attention to right, decreased attention, decreased awareness, decreased problem solving, decreased safety awareness and decreased memory and decreased sitting balance, decreased standing balance, decreased postural control, hemiplegia and decreased balance strategies.  Prior to hospitalization, patient could complete BADL/IADL with independent .  Patient  will benefit from skilled intervention to decrease level of assist  with basic self-care skills and increase independence with basic self-care skills prior to discharge home with care partner.  Anticipate patient will require 24 hour supervision and minimal physical assistance and follow up home health.  OT - End of Session Endurance Deficit: Yes Endurance Deficit Description: pt fatigued easily OT Assessment OT Barriers to Discharge: Decreased caregiver support;Inaccessible home environment;Lack of/limited family support OT Patient demonstrates impairments in the following area(s): Balance;Cognition;Endurance;Motor;Pain;Perception;Safety;Sensory;Vision OT Basic ADL's Functional Problem(s): Grooming;Bathing;Dressing;Toileting;Eating OT Transfers Functional Problem(s): Toilet;Tub/Shower OT Additional Impairment(s): Fuctional Use of Upper Extremity OT Plan OT Intensity: Minimum of 1-2 x/day, 45 to 90 minutes OT Frequency: 5 out of 7 days OT Duration/Estimated Length of Stay: 24-28 OT Treatment/Interventions: Balance/vestibular training;Discharge planning;Functional electrical stimulation;Pain management;Self Care/advanced ADL retraining;Therapeutic Activities;UE/LE Coordination activities;Cognitive remediation/compensation;Disease mangement/prevention;Functional mobility training;Patient/family education;Therapeutic Exercise;Skin care/wound managment;Visual/perceptual remediation/compensation;Wheelchair propulsion/positioning;UE/LE Strength taining/ROM;Splinting/orthotics;Psychosocial support;Neuromuscular re-education;DME/adaptive equipment instruction;Community reintegration OT Self Feeding Anticipated Outcome(s): S OT Basic Self-Care Anticipated Outcome(s): MIN A OT Toileting Anticipated Outcome(s): MIN A OT Bathroom Transfers Anticipated Outcome(s): MIN A OT Recommendation Follow Up Recommendations: Home health OT Equipment Recommended: To be determined;3 in 1 bedside comode;Tub/shower bench   Skilled Therapeutic Intervention 1:1. Pt received in bed  agreeable to OT after edu re role/purpose, CIR, ELOS and POC. Pt completes bed level ADLs and details supported below in ADL section of eval. Pt rolls with mod-total A for clothing management. Pt requires cuing throughout ADL for attention to R side body and environment. Pt demo poor midline orientation in sitting and pushing in standing. Pt completes slide board transfer with MOD A+2 present to hold board steady with pt leaning back at end of transfer needing max VC for anterior weigth shift to prevent sliding forward in chair. Pt completes sit to stand in stedy with MOD A for power up with LUE on bar and Vc for keeping weight left. Exited session with pt seated in TIS and NT present for self feeding.   OT Evaluation Precautions/Restrictions  Precautions Precautions: Fall Precaution Comments: pusher Restrictions Weight Bearing Restrictions: No General Chart Reviewed: Yes Family/Caregiver Present: No Vital Signs  Pain   Home Living/Prior Functioning Home Living Family/patient expects to be discharged to:: Private residence Living Arrangements: Alone Available Help at Discharge: Family, Friend(s), Available 24 hours/day Type of Home: House Home Access: Stairs to enter Technical brewer of Steps: 1 Entrance Stairs-Rails: None Home Layout: One level Biochemist, clinical: Standard Bathroom Accessibility: Yes  Lives With: Alone Prior Function Vocation: Full time employment Comments: pt is a famous Development worker, community, 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 [place] ADL ADL Eating: Moderate cueing, Supervision/safety Grooming: Minimal assistance Where Assessed-Grooming: Sitting at sink Upper Body Bathing: Minimal assistance Where Assessed-Upper Body Bathing: Bed level Lower Body Bathing: Maximal assistance Where Assessed-Lower Body Bathing: Bed level Upper Body Dressing: Maximal assistance Where Assessed-Upper Body Dressing: Bed level Lower Body Dressing: Maximal  assistance Where Assessed-Lower Body Dressing: Bed level Toilet Transfer:  (MOD A STS in stedy) Vision Baseline Vision/History: Wears glasses Patient Visual Report:  (unable to report d/t expressive deficits) Vision Assessment?: Vision impaired- to be further tested in functional context Perception  Perception: Impaired Inattention/Neglect: Does not attend to right side of body;Does not attend to right visual field Praxis Praxis: Impaired Praxis Impairment Details: Motor planning Cognition Overall Cognitive Status: Impaired/Different from baseline Arousal/Alertness: Awake/alert Orientation Level: Person Year:  (unable to report d/t expressive deficits) Month:  (unable to report d/t expressive deficits) Day of Week:  (unable  to report d/t expressive deficits) Memory: Impaired Immediate Memory Recall:  (unable to report d/t expressive deficits) Memory Recall Sock:  (unable to report d/t expressive deficits) Memory Recall Blue:  (unable to report d/t expressive deficits) Memory Recall Bed:  (unable to report d/t expressive deficits) Safety/Judgment: Impaired Sensation Sensation Light Touch: Impaired by gross assessment (impaired RUE/LE) Coordination Gross Motor Movements are Fluid and Coordinated: No Fine Motor Movements are Fluid and Coordinated: No Coordination and Movement Description: dense R hemi Motor  Motor Motor: Hemiplegia Motor - Skilled Clinical Observations: dense R hemi Mobility  Bed Mobility Bed Mobility: Rolling Right;Rolling Left;Supine to Sit Rolling Right: Moderate Assistance - Patient 50-74% Rolling Left: Total Assistance - Patient < 25% Supine to Sit: Maximal Assistance - Patient - Patient 25-49% Transfers Sit to Stand: Dependent - mechanical lift Stand to Sit: Dependent - mechanical lift  Trunk/Postural Assessment  Cervical Assessment Cervical Assessment:  (head forward) Thoracic Assessment Thoracic Assessment:  (rounded shoulders) Lumbar  Assessment Lumbar Assessment:  (posterior pelvic tilt) Postural Control Postural Control: Deficits on evaluation (required MAX VC for correction in midline orientation/R lean; delayed)  Balance Balance Balance Assessed: Yes Dynamic Sitting Balance Dynamic Sitting - Level of Assistance: 4: Min assist;3: Mod assist Sitting balance - Comments: R lateral lean is pt preference, able to correct with visual midline cuing and at times needs min-mod assist to correct. Static Standing Balance Static Standing - Level of Assistance: 2: Max assist Static Standing - Comment/# of Minutes: pushing in standing with No UE support on stedy; Min-mod A to power up and min-mod A maintain balance wiht UE on bar Extremity/Trunk Assessment RUE Assessment RUE Assessment: Exceptions to [facility] General Strength Comments: 0/5 RUE Body System: Neuro Brunstrum levels for arm and hand: Arm;Hand Brunstrum level for arm: Stage I Presynergy Brunstrum level for hand: Stage I Flaccidity LUE Assessment LUE Assessment: Within Functional Limits     Refer to Care Plan for Long Term Goals  Recommendations for other services: None    Discharge Criteria: Patient will be discharged from OT if patient refuses treatment 3 consecutive times without medical reason, if treatment goals not met, if there is a change in medical status, if patient makes no progress towards goals or if patient is discharged from hospital.  The above assessment, treatment plan, treatment alternatives and goals were discussed and mutually agreed upon: by patient  Tonny Branch 02/03/2020, 8:18 AM

## 2020-02-03 NOTE — Patient Care Conference (Signed)
Inpatient RehabilitationTeam Conference and Plan of Care Update Date: 02/03/2020   Time: 3:12 PM    Patient Name: Dustin Schneider      Medical Record Number: [id number]  Date of Birth: Sep 14, 1955 Sex: Male         Room/Bed: 4W14C/4W14C-02 Payor Info: Payor: [place] / Plan: [facility] PPO / Product Type: *No Product type* /    Admit Date/Time:  02/02/2020  4:11 PM  Primary Diagnosis:  Acute ischemic left MCA stroke [facility])  Patient Active Problem List   Diagnosis Date Noted  . AKI (acute kidney injury) ([place])   . Global aphasia   . Leukocytosis   . Hypoalbuminemia due to protein-calorie malnutrition ([place])   . Hyperglycemia   . Dysphagia, post-stroke   . PAF (paroxysmal atrial fibrillation) ([place])   . Hyperlipidemia 02/02/2020  . Dysphagia following cerebral infarction 02/02/2020  . Aspiration pneumonia ([place]) 02/02/2020  . Acute ischemic left MCA stroke ([place]) 02/02/2020  . Acute respiratory failure ([place])   . Acute ischemic stroke [facility]) s/p clot retrieval L MCA & ACA A3 01/27/2020  . Aspiration into airway 01/27/2020  . Chronic anticoagulation 01/27/2020  . Paroxysmal atrial fibrillation ([place]) 06/15/2018  . Essential hypertension 06/15/2018  . NICM (nonischemic cardiomyopathy) ([place]) 06/15/2018  . Visit for monitoring Tikosyn therapy 06/12/2018    Expected Discharge Date: Expected Discharge Date:  (3-4 week LOS)  Team Members Present: Physician leading conference: Dr. Delice Lesch Care Coodinator Present: Dorien Chihuahua, RN, BSN, CRRN;Christina Sampson Goon, [place] Nurse Present: Rayne Du, LPN PT Present: Deniece Ree, PT OT Present: Meriel Pica, OT PPS Coordinator present : Gunnar Fusi, SLP     Current Status/Progress Goal Weekly Team Focus  Bowel/Bladder   Patient is incontinent of bladder and continent of BM. LBM 02/02/20  Obtain continence of bowel and bladder  Q 2 hours and PRN toileting and incontinent care.   Swallow/Nutrition/ Hydration              ADL's             Mobility   bed mobility +2, squat<>pivot transfers +2, sit<>stand max A +2, gait 66ft max A +2  min A  functional mobility/transfers, midline orientation, dynamic sitting/standing balance/coordination, gait training, NMR, endurance.   Communication             Safety/Cognition/ Behavioral Observations            Pain   Patient communicate through nodding and gestures, this rotation no c/o pain  pain <3 with or without activity  Q shift and PRN pain assessment, administer pain medication as needed.   Skin   Patient has no skin issues other than the incision in L groin which is OTA  maintain good skin condition and prevent further skin breakdown and infection in incision  Q shift and PRN skin assessment      *See Care Plan and progress notes for long and short-term goals.     Barriers to Discharge  Current Status/Progress Possible Resolutions Date Resolved   Nursing                  PT  Medical stability;Home environment access/layout  denise R hemiparesis, aphaisa, pusher              OT                  SLP  Care Coordinator                Discharge Planning/Teaching Needs:       TBD  Team Discussion:  Initial evaluations incomplete. Currently mod-max assist + 2 to transfer with OT and Squat pivot transfers Max assist +2. Minimal assist goals set with estimated 3-4 week LOS.  Revisions to Treatment Plan:  None    Medical Summary Current Status: Impaired mobility and ADLs secondary to left MCA ischemic stroke Weekly Focus/Goal: Improve mobility, swallowing, WBCs, BP, PAF, AKI  Barriers to Discharge: Behavior;Incontinence;Medical stability   Possible Resolutions to Barriers: Therapies, optmize BP meds, follow labs, encourage fluids   Continued Need for Acute Rehabilitation Level of Care: The patient requires daily medical management by a physician with specialized training in physical medicine and rehabilitation for the  following reasons: Direction of a multidisciplinary physical rehabilitation program to maximize functional independence : Yes Medical management of patient stability for increased activity during participation in an intensive rehabilitation regime.: Yes Analysis of laboratory values and/or radiology reports with any subsequent need for medication adjustment and/or medical intervention. : Yes   I attest that I was present, lead the team conference, and concur with the assessment and plan of the team.   Dorien Chihuahua B 02/03/2020, 3:12 PM

## 2020-02-03 NOTE — Discharge Instructions (Addendum)
Inpatient Rehab Discharge Instructions  Dustin Schneider Discharge date and time: 03/02/20    Activities/Precautions/ Functional Status: Activity: no lifting, driving, or strenuous exercise  till cleared by MD Diet: Heart healthy. NO STRAWS. Small bites/sips.  Wound Care: none needed    Functional status:  ___ No restrictions     ___ Walk up steps independently _X__ 24/7 supervision/assistance   ___ Walk up steps with assistance ___ Intermittent supervision/assistance  ___ Bathe/dress independently ___ Walk with walker     _X__ Bathe/dress with assistance ___ Walk Independently    ___ Shower independently ___ Walk with assistance    ___ Shower with assistance _X__ No alcohol     ___ Return to work/school ________   Special Instructions: 1. Wheelchair mobility--walking with therapy only at this time.  2. Wear support stocking on right leg to help with swelling. Can ace wrap if you prefer.   STROKE/TIA DISCHARGE INSTRUCTIONS SMOKING Cigarette smoking nearly doubles your risk of having a stroke & is the single most alterable risk factor  If you smoke or have smoked in the last 12 months, you are advised to quit smoking for your health.  Most of the excess cardiovascular risk related to smoking disappears within a year of stopping.  Ask you doctor about anti-smoking medications  [place] Quit Line: 1-800-QUIT NOW  Free Smoking Cessation Classes [phone]  CHOLESTEROL Know your levels; limit fat & cholesterol in your diet  Lipid Panel     Component Value Date/Time   CHOL 125 01/28/2020 0716   CHOL 226 (H) 12/17/2019 0831   TRIG 298 (H) 01/28/2020 0716   HDL 32 (L) 01/28/2020 0716   HDL 39 (L) 12/17/2019 0831   CHOLHDL 3.9 01/28/2020 0716   VLDL 60 (H) 01/28/2020 0716   LDLCALC 33 01/28/2020 0716   LDLCALC 154 (H) 12/17/2019 0831      Many patients benefit from treatment even if their cholesterol is at goal.  Goal: Total Cholesterol (CHOL) less than 160  Goal:   Triglycerides (TRIG) less than 150  Goal:  HDL greater than 40  Goal:  LDL (LDLCALC) less than 100   BLOOD PRESSURE American Stroke Association blood pressure target is less that 120/80 mm/Hg  Your discharge blood pressure is:  BP: 110/83  Monitor your blood pressure  Limit your salt and alcohol intake  Many individuals will require more than one medication for high blood pressure  DIABETES (A1c is a blood sugar average for last 3 months) Goal HGBA1c is under 7% (HBGA1c is blood sugar average for last 3 months)  Diabetes: No known diagnosis of diabetes    Lab Results  Component Value Date   HGBA1C 5.6 01/28/2020     Your HGBA1c can be lowered with medications, healthy diet, and exercise.  Check your blood sugar as directed by your physician  Call your physician if you experience unexplained or low blood sugars.  PHYSICAL ACTIVITY/REHABILITATION Goal is 30 minutes at least 4 days per week  Activity: Increase activity slowly, and No driving, Therapies: see below Return to work: N/A  Activity decreases your risk of heart attack and stroke and makes your heart stronger.  It helps control your weight and blood pressure; helps you relax and can improve your mood.  Participate in a regular exercise program.  Talk with your doctor about the best form of exercise for you (dancing, walking, swimming, cycling).  DIET/WEIGHT Goal is to maintain a healthy weight  Your discharge diet is:  Diet Order  Diet regular Room service appropriate? Yes; Fluid consistency: Thin  Diet effective now                liquids Your height is:  Height: 5\' 9"  (175.3 cm) Your current weight is: Weight: 77 kg Your Body Mass Index (BMI) is:  BMI (Calculated): 25.06  Following the type of diet specifically designed for you will help prevent another stroke.  You are at goal weight   Your goal Body Mass Index (BMI) is 19-24.  Healthy food habits can help reduce 3 risk factors for stroke:   High cholesterol, hypertension, and excess weight.  RESOURCES Stroke/Support Group:  Call [phone]   STROKE EDUCATION PROVIDED/REVIEWED AND GIVEN TO PATIENT Stroke warning signs and symptoms How to activate emergency medical system (call 911). Medications prescribed at discharge. Need for follow-up after discharge. Personal risk factors for stroke. Pneumonia vaccine given:  Flu vaccine given:  My questions have been answered, the writing is legible, and I understand these instructions.  I will adhere to these goals & educational materials that have been provided to me after my discharge from the hospital.        [place]:    Outpatient: PT   OT    ST             Agency: [facility] Phone: [phone]              Appointment Date/Time: Facility to Determine at Discharge  Medical Equipment/Items Ordered: Tub Transfer Bench, Drop Arm Bedside Commode, 18X18 Wheelchair                                                 Agency/Supplier: [facility]  PCP Follow Up: [facility] on August 17th at 8:50 AM with Juluis Mire, NP                                                                       [address] [place], [place] [address]                                                                             [phone]   My questions have been answered and I understand these instructions. I will adhere to these goals and the provided educational materials after my discharge from the hospital.  Patient/Caregiver Signature _______________________________ Date __________  Clinician Signature  _______________________________________ Date __________  Please bring this form and your medication list with you to all your follow-up doctor's appointments. Information on my medicine - ELIQUIS (apixaban)  This medication education  was reviewed with me or my healthcare representative as part of my discharge preparation.    Why was Eliquis prescribed for you? Eliquis was prescribed for you to reduce the risk of a blood clot forming that can cause a stroke if you have a medical condition called atrial fibrillation (a type of irregular heartbeat).  What do You need to know about Eliquis ? Take your Eliquis TWICE DAILY - one tablet in the morning and one tablet in the evening with or without food. If you have difficulty swallowing the tablet whole please discuss with your pharmacist how to take the medication safely.  Take Eliquis exactly as prescribed by your doctor and DO NOT stop taking Eliquis without talking to the doctor who prescribed the medication.  Stopping may increase your risk of developing a stroke.  Refill your prescription before you run out.  After discharge, you should have regular check-up appointments with your healthcare provider that is prescribing your Eliquis.  In the future your dose may need to be changed if your kidney function or weight changes by a significant amount or as you get older.  What do you do if you miss a dose? If you miss a dose, take it as soon as you remember on the same day and resume taking twice daily.  Do not take more than one dose of ELIQUIS at the same time to make up a missed dose.  Important Safety Information A possible side effect of Eliquis is bleeding. You should call your healthcare provider right away if you experience any of the following: ? Bleeding from an injury or your nose that does not stop. ? Unusual colored urine (red or dark brown) or unusual colored stools (red or black). ? Unusual bruising for unknown reasons. ? A serious fall or if you hit your head (even if there is no bleeding).  Some medicines may interact with Eliquis and might increase your risk of bleeding or clotting while on Eliquis. To help avoid this, consult your healthcare provider  or pharmacist prior to using any new prescription or non-prescription medications, including herbals, vitamins, non-steroidal anti-inflammatory drugs (NSAIDs) and supplements.  This website has more information on Eliquis (apixaban): http://[email or web address].com/eliquis/home

## 2020-02-03 NOTE — Progress Notes (Addendum)
[facility] PHYSICAL MEDICINE & REHABILITATION PROGRESS NOTE  Subjective/Complaints: Patient seen sitting up in his chair this morning.  No reported issues overnight.  He is working with therapies.  ROS: Limited due to aphasia.  Objective: Vital Signs: Blood pressure 140/88, pulse 63, temperature (!) 97.5 F (36.4 C), temperature source Oral, resp. rate 16, height 5\' 9"  (1.753 m), weight 80.2 kg, SpO2 96 %. No results found. Recent Labs    02/02/20 0558 02/03/20 0542  WBC 9.3 11.5*  HGB 13.8 13.7  HCT 42.5 42.0  PLT 206 214   Recent Labs    02/02/20 0558 02/03/20 0542  NA 142 141  K 3.8 4.8  CL 103 101  CO2 30 30  GLUCOSE 115* 118*  BUN 24* 28*  CREATININE 1.02 1.14  CALCIUM 9.1 9.2    Physical Exam: BP 140/88 (BP Location: Left Arm)   Pulse 63   Temp (!) 97.5 F (36.4 C) (Oral)   Resp 16   Ht 5\' 9"  (1.753 m)   Wt 80.2 kg   SpO2 96%   BMI 26.11 kg/m  Constitutional: No distress . Vital signs reviewed. HENT: Normocephalic.  Atraumatic.  + NG. Eyes: EOMI. No discharge. Cardiovascular: No JVD. RRR Respiratory: Normal effort.  No stridor.  Bilaterally clear to auscultation GI: Non-distended. Skin: Warm and dry.  Intact. Psych: Unable to assess due to aphasia Musc: No edema in extremities.  No tenderness in extremities. Neuro: Alert Expressive greater than receptive aphasia Motor: Limited due to aphasia, no movement noted on right side, moving left side spontaneously  Assessment/Plan: 1. Functional deficits secondary to left MCA infarct which require 3+ hours per day of interdisciplinary therapy in a comprehensive inpatient rehab setting.  Physiatrist is providing close team supervision and 24 hour management of active medical problems listed below.  Physiatrist and rehab team continue to assess barriers to discharge/monitor patient progress toward functional and medical goals  Care Tool:  Bathing              Bathing assist       Upper Body  Dressing/Undressing Upper body dressing   What is the patient wearing?: Hospital gown only    Upper body assist Assist Level: Maximal Assistance - Patient 25 - 49%    Lower Body Dressing/Undressing Lower body dressing      What is the patient wearing?: Hospital gown only, Incontinence brief     Lower body assist Assist for lower body dressing: Maximal Assistance - Patient 25 - 49%     Toileting Toileting    Toileting assist Assist for toileting: Maximal Assistance - Patient 25 - 49%     Transfers Chair/bed transfer  Transfers assist  Chair/bed transfer activity did not occur: Safety/medical concerns  Chair/bed transfer assist level: 2 Helpers     Locomotion Ambulation   Ambulation assist      Assist level: 2 helpers Assistive device: Other (comment) (rail on L) Max distance: 64ft   Walk 10 feet activity   Assist  Walk 10 feet activity did not occur: Safety/medical concerns (R hemi, LE weakness, decreased balance/postural control)        Walk 50 feet activity   Assist Walk 50 feet with 2 turns activity did not occur: Safety/medical concerns (R hemi, LE weakness, decreased balance/postural control)         Walk 150 feet activity   Assist Walk 150 feet activity did not occur: Safety/medical concerns (R hemi, LE weakness, decreased balance/postural control)  Walk 10 feet on uneven surface  activity   Assist Walk 10 feet on uneven surfaces activity did not occur: Safety/medical concerns (R hemi, LE weakness, decreased balance/postural control)         Wheelchair     Assist Will patient use wheelchair at discharge?: Yes      Wheelchair assist level: Dependent - Patient 0%      Wheelchair 50 feet with 2 turns activity    Assist        Assist Level: Dependent - Patient 0%   Wheelchair 150 feet activity     Assist     Assist Level: Dependent - Patient 0%      Medical Problem List and Plan: 1.  Impaired  mobility and ADLs secondary to left MCA ischemic stroke  Begin CIR evaluations 2.  Antithrombotics: -DVT/anticoagulation:  Pharmaceutical: Other (comment)--Eliquis             -antiplatelet therapy: N/A 3. Pain Management: Tylenol prn.  4. Mood: LCSW to follow for evaluation and support.              -antipsychotic agents: N/A 5. Neuropsych: This patient is not capable of making decisions on his own behalf. 6. Skin/Wound Care: N/A 7. Fluids/Electrolytes/Nutrition: Monitor I/Os. 8. PAF: Monitor HR tid--has been off Tikosyn since admission. Continue metoprolol bid with Eliquis.   Will discuss with Cardiology  ECG ordered 9. HTN: Monitor BP--managed on metoprolol and Avapro. Has been elevated.   Monitor with increased mobility 10. Dyslipidemia: Continue Crestor. 11.  Post stroke dysphagia: Dysphagia 2, nectar liquids with chin tuck.   Hold tube feeds and start calorie count.   Continue water flushes for now.   Advance diet as tolerated  12. Impaired fasting glucose: Likely secondary to tube feeds  Continue to monitor 13. Nonischemic cardiomyopathy: outpatient cardiology follow-up. 14.  Hypoalbuminemia  Supplement initiated on 6/23 15.  Leukocytosis  WBCs 11.5 on 6/23  Afebrile  Continue to monitor 16.  AKI  Creatinine 1.14 on 6/23  Encourage fluids  Will consider IVF if necessary  Continue to monitor  LOS: 1 days A FACE TO FACE EVALUATION WAS PERFORMED  Timberly Yott [name] 02/03/2020, 10:07 AM

## 2020-02-03 NOTE — Evaluation (Signed)
Speech Language Pathology Assessment and Plan  Patient Details  Name: Dustin Schneider MRN: [id number] Date of Birth: [birth year]  SLP Diagnosis: Aphasia;Cognitive Impairments;Dysphagia  Rehab Potential: Good ELOS: 3-4 weeks    Today's Date: 02/03/2020 SLP Individual Time: 1305-1401 SLP Individual Time Calculation (min): 11 min   Hospital Problem: Principal Problem:   Acute ischemic left MCA stroke ([place]) Active Problems:   AKI (acute kidney injury) ([place])   Global aphasia   Leukocytosis   Hypoalbuminemia due to protein-calorie malnutrition ([facility])   Hyperglycemia   Dysphagia, post-stroke   PAF (paroxysmal atrial fibrillation) ([place])  Past Medical History:  Past Medical History:  Diagnosis Date  . Hypertension   . NICM (nonischemic cardiomyopathy) ([place]) 06/15/2018   04/23/18: EF 45%, 09/15/18: NOrmal LVEF  . Paroxysmal atrial fibrillation ([place]) 06/15/2018  . Visit for monitoring Tikosyn therapy 06/12/2018   Past Surgical History:  Past Surgical History:  Procedure Laterality Date  . CARDIOVERSION N/A 05/13/2018   Procedure: CARDIOVERSION;  Surgeon: [name], MD;  Location: [facility] ENDOSCOPY;  Service: Cardiovascular;  Laterality: N/A;  . CARDIOVERSION N/A 06/13/2018   Procedure: CARDIOVERSION;  Surgeon: [name], MD;  Location: [facility] ENDOSCOPY;  Service: Cardiovascular;  Laterality: N/A;  . CARDIOVERSION N/A 06/23/2018   Procedure: CARDIOVERSION;  Surgeon: [name], MD;  Location: [facility];  Service: Cardiovascular;  Laterality: N/A;  . excision of actinic keratosis    . EYE SURGERY     eye lift  . IR CT HEAD LTD  01/27/2020  . IR PERCUTANEOUS ART THROMBECTOMY/INFUSION INTRACRANIAL INC DIAG ANGIO  01/27/2020      . IR PERCUTANEOUS ART THROMBECTOMY/INFUSION INTRACRANIAL INC DIAG ANGIO  01/27/2020  . RADIOLOGY WITH ANESTHESIA N/A 01/27/2020   Procedure: IR WITH ANESTHESIA;  Surgeon: Radiologist, Medication, MD;  Location: [facility];  Service: Radiology;  Laterality: N/A;  .  TONSILLECTOMY    . WISDOM TOOTH EXTRACTION      Assessment / Plan / Recommendation Clinical Impression   HPI: Dustin Schneider is a 64 year old male with history of PAF- on Xarelto, NICM; who was admitted on 01/27/20 after collapsing at [facility] hardware. He was found to have right sided weakness with left gaze preference as well as N/V. CT head done at admission revealing hyperdense sign left M2 branch c/w acute thrombus. He underwent cerebral angio with complete recanalization of L-MCA and ACA. 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  2D echo showed EF 55-60% with moderate biatrial dilatation, mild MVR and grade  2 DD.   He tolerated extubation on 06/18 and made NPO due to facial weakness with signs of dysphagia and risk for silent aspiration. Patient with dense right hemiplegia with aphasia. Cortak placed and he was started on tube feeds.  He was maintained on ASA given IR petechial hemorrhage and transitioned to Eliquis 06/22. He continues to be limited by right sided weakness with right lean, apraxia with limited verbal output and needs multimodal cues to follow simple commands. Pt [facility] to [facility] CIR 02/02/20 and SLP evaluations completed 02/03/20 with results as follows:  Clinical bedside swallow: Pt seen with dysphagia 2 lunch tray and nectar thick liquids. Although he did not use chin tuck strategy consistently, no overt s/sx aspiration were observed across liquid or solid intake. His mastication of Dysphagia 2 (minced) solids was efficient and  full oral clearance achieved. Reduced labial seal and Min right anterior loss noted with both solids and nectar liquids; pt receptive to Min A cues to compensate for anterior loss. Recommend pt continue current Dysphagia 2  (minced) texture diet, nectar thick liquids, meds crushed in applesauce, full supervision to ensure use of strategies.  Cognitive-linguistic evaluation: Pt presents with severe expressive aphasia complicated by oral apraxia. He is able to imitated phonemes in 4 out of 5 attempts. Although he could not produce all numbers, he verbalized 4, 8, and 9 when attempting to count from 1-10. He also sang happy birthday with 90% accuracy. Pt with frequent perseveration on "um" and "I can't". When provided with written choices, he did point to his name, place, and month with 100% accuracy. He shook his head no when asked if he knew why he was at the hospital, therefore likely disoriented to situation. During single work repetition tasks, pt pprovided approximations X2 and repeated 100% accurately X1 out of 4 opportunities. He is visibly frustrated by limitations to his communication at this time, and aware of verbal errors. Although errors are inconsistent in nature, phonemic paraphasias noted. He was intermittently responsive to orthographic and semantic sentence completion cues. [name] vocal intensity is very low, further inhibiting communication due to reduced intelligibility at the word level. Pt sustained his attention throughout session without difficulty, although emergent awareness and basic to mildly complex problem solving deficits noted.  Recommend pt receive skilled ST services to address dysphagia, aphasia/apraxia, and cognition as detailed above in order to ensure diet safety and efficiency, maximize functional communication, independence, and safety prior to discharge.     Skilled Therapeutic Interventions          Bedside swallow and cognitive-linguistic evaluations were administered and results were reviewed with pt (please see above for details regarding results).    SLP Assessment  Patient will need skilled Speech Lanaguage Pathology Services during CIR admission    Recommendations  SLP Diet  Recommendations: Dysphagia 2 (Fine chop);Nectar Liquid Administration via: Cup;No straw Medication Administration: Crushed with puree Supervision: Patient able to self feed;Full supervision/cueing for compensatory strategies Compensations: Slow rate;Small sips/bites Postural Changes and/or Swallow Maneuvers: Seated upright 90 degrees;Upright 30-60 min after meal Oral Care Recommendations: Oral care QID Patient destination: Home Follow up Recommendations: Outpatient SLP;24 hour supervision/assistance;Home Health SLP Equipment Recommended: None recommended by SLP    SLP Frequency 3 to 5 out of 7 days   SLP Duration  SLP Intensity  SLP Treatment/Interventions 3-4 weeks  Minumum of 1-2 x/day, 30 to 90 minutes  Cognitive remediation/compensation;Cueing hierarchy;Dysphagia/aspiration precaution training;Functional tasks;Patient/family education;Therapeutic Activities;Speech/Language facilitation;Multimodal communication approach;Internal/external aids    Pain Pain Assessment Pain Scale: 0-10 Pain Score: 0-No pain     SLP Evaluation Cognition Overall Cognitive Status: Impaired/Different from baseline Arousal/Alertness: Awake/alert Orientation Level: Oriented to person;Oriented to time;Oriented to place;Disoriented to situation (pt could select location and month when provided written choices (by pointing). He shook head no when asked if he knew why he was in the hospital.) Attention: Sustained Sustained Attention: Appears intact Memory:  (difficult to fully assess due to expressive language deficits) Awareness: Impaired Awareness Impairment: Emergent impairment Problem Solving: Impaired Problem Solving Impairment: Verbal basic;Functional basic Safety/Judgment: Impaired  Comprehension Auditory Comprehension Overall Auditory Comprehension: Impaired Yes/No Questions: Not tested (further assess) Commands: Impaired One Step Basic Commands: 75-100% accurate Conversation:  Simple EffectiveTechniques: Repetition Visual Recognition/Discrimination Discrimination: Not tested Reading Comprehension Reading Status: Not tested Expression Expression Primary Mode of Expression: Verbal Verbal Expression Overall Verbal  Expression: Impaired Initiation: Impaired Automatic Speech: Counting Level of Generative/Spontaneous Verbalization: Word Repetition: Impaired Level of Impairment: Word level Naming: Impairment Responsive: Not tested Confrontation: Impaired Convergent: Not tested Divergent: Not tested Verbal Errors: Phonemic paraphasias;Aware of errors Pragmatics: No impairment Interfering Components: Speech intelligibility Effective Techniques: Sentence completion;Semantic cues;Phonemic cues;Written cues Non-Verbal Means of Communication: Gestures Written Expression Written Expression: Not tested Oral Motor Oral Motor/Sensory Function Overall Oral Motor/Sensory Function: Moderate impairment Facial ROM: Reduced right;Suspected CN VII (facial) dysfunction Facial Symmetry: Abnormal symmetry right;Suspected CN VII (facial) dysfunction Facial Strength: Reduced right;Suspected CN VII (facial) dysfunction Lingual ROM: Within Functional Limits Lingual Symmetry: Within Functional Limits Lingual Strength: Within Functional Limits Velum: Within Functional Limits Mandible: Within Functional Limits Motor Speech Overall Motor Speech: Impaired Respiration: Within functional limits Phonation: Low vocal intensity Resonance: Within functional limits Articulation: Within functional limitis Intelligibility: Intelligibility reduced Word: 75-100% accurate Phrase: Not tested Sentence: Not tested Conversation: Not tested Motor Planning: Impaired Level of Impairment: Word Motor Speech Errors: Aware;Inconsistent Effective Techniques: Increased vocal intensity   Intelligibility: Intelligibility reduced Word: 75-100% accurate Phrase: Not tested Sentence: Not  tested Conversation: Not tested  Bedside Swallowing Assessment General Previous Swallow Assessment: MBSS 02/02/20 Diet Prior to this Study: Dysphagia 2 (chopped);Nectar-thick liquids Temperature Spikes Noted: No Respiratory Status: Room air History of Recent Intubation: Yes Length of Intubations (days): 2 days Date extubated: 01/29/20 Behavior/Cognition: Alert;Cooperative;Pleasant mood Oral Cavity - Dentition: Adequate natural dentition Self-Feeding Abilities: Able to feed self Vision: Functional for self-feeding Patient Positioning: Upright in bed Baseline Vocal Quality: Low vocal intensity Volitional Cough: Strong Volitional Swallow: Able to elicit  Oral Care Assessment Does patient have any of the following "high(er) risk" factors?: None of the above Does patient have any of the following "at risk" factors?: Diet - patient on thickened liquids;Other - dysphagia Patient is AT RISK: Order set for Adult Oral Care Protocol initiated -  "At Risk Patients" option selected (see row information) Ice Chips Ice chips: Not tested Thin Liquid Thin Liquid: Not tested Nectar Thick Nectar Thick Liquid: Within functional limits Presentation: Cup;Self Fed Honey Thick Honey Thick Liquid: Not tested Puree Puree: Impaired Presentation: Self Fed;Spoon Oral Phase Impairments: Reduced labial seal Oral Phase Functional Implications: Right anterior spillage Solid Solid: Impaired Presentation: Self Fed Oral Phase Impairments: Reduced labial seal Oral Phase Functional Implications: Right anterior spillage BSE Assessment Risk for Aspiration Impact on safety and function: Mild aspiration risk  Short Term Goals: Week 1: SLP Short Term Goal 1 (Week 1): Pt will consume current diet with minimal overt s/sx aspiration, efficient mastication and oral clearance with Supervision A verbal cues for use of strategies. SLP Short Term Goal 2 (Week 1): Pt will consume trials of thin liquids with minimal  overt s/sx aspiration over 3 sessions prior to advancement. SLP Short Term Goal 3 (Week 1): Pt will consume trials of dysphagia 3 (mech soft) solids demonstrating efficient mastication and oral clearance X2 prior to advancement. SLP Short Term Goal 4 (Week 1): Pt will use multimodal means of communication to express basic wants and needs with Max A multimodal cues. SLP Short Term Goal 5 (Week 1): Pt will participate in automatic speech tasks with 80% accuracy provided Max A multimodal cues. SLP Short Term Goal 6 (Week 1): Pt will verbalize at the word level with Max A multimodal cues.  Refer to Care Plan for Long Term Goals  Recommendations for other services: None   Discharge Criteria: Patient will be discharged from SLP if patient refuses treatment 3 consecutive times  without medical reason, if treatment goals not met, if there is a change in medical status, if patient makes no progress towards goals or if patient is discharged from hospital.  The above assessment, treatment plan, treatment alternatives and goals were discussed and mutually agreed upon: by patient and significant other  Arbutus Leas 02/03/2020, 3:24 PM

## 2020-02-03 NOTE — Evaluation (Signed)
Physical Therapy Assessment and Plan  Patient Details  Name: Dustin Schneider MRN: [id number] Date of Birth: 1955-09-26  PT Diagnosis: Abnormal posture, Abnormality of gait, Cognitive deficits, Coordination disorder, Difficulty walking, Hemiparesis dominant, Hemiplegia dominant, Impaired cognition, Impaired sensation and Muscle weakness Rehab Potential: Good ELOS: 3-4 weeks   Today's Date: 02/03/2020 PT Individual Time: 0900-0953 PT Individual Time Calculation (min): 53 min    Hospital Problem: Principal Problem:   Acute ischemic left MCA stroke [facility])   Past Medical History:  Past Medical History:  Diagnosis Date  . Hypertension   . NICM (nonischemic cardiomyopathy) ([place]) 06/15/2018   04/23/18: EF 45%, 09/15/18: NOrmal LVEF  . Paroxysmal atrial fibrillation ([place]) 06/15/2018  . Visit for monitoring Tikosyn therapy 06/12/2018   Past Surgical History:  Past Surgical History:  Procedure Laterality Date  . CARDIOVERSION N/A 05/13/2018   Procedure: CARDIOVERSION;  Surgeon: [name], MD;  Location: [facility] ENDOSCOPY;  Service: Cardiovascular;  Laterality: N/A;  . CARDIOVERSION N/A 06/13/2018   Procedure: CARDIOVERSION;  Surgeon: [name], MD;  Location: [facility] ENDOSCOPY;  Service: Cardiovascular;  Laterality: N/A;  . CARDIOVERSION N/A 06/23/2018   Procedure: CARDIOVERSION;  Surgeon: [name], MD;  Location: [place];  Service: Cardiovascular;  Laterality: N/A;  . excision of actinic keratosis    . EYE SURGERY     eye lift  . IR CT HEAD LTD  01/27/2020  . IR PERCUTANEOUS ART THROMBECTOMY/INFUSION INTRACRANIAL INC DIAG ANGIO  01/27/2020      . IR PERCUTANEOUS ART THROMBECTOMY/INFUSION INTRACRANIAL INC DIAG ANGIO  01/27/2020  . RADIOLOGY WITH ANESTHESIA N/A 01/27/2020   Procedure: IR WITH ANESTHESIA;  Surgeon: Radiologist, Medication, MD;  Location: [place];  Service: Radiology;  Laterality: N/A;  . TONSILLECTOMY    . WISDOM TOOTH EXTRACTION      Assessment & Plan Clinical Impression:  Patient is a 64 y.o. year old male with history of PAF maintained on chronic Xarelto as well as Tikosyn, nonischemic cardiomyopathyfollowed by Dr. Maryjean Ka hypertension. 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 Presented 01/27/2020 with acute onset of left forced gaze and right hemiplegiaas well as aphasia. Patient was also nauseous and vomiting. CT of the head showed hyperdense left M2 branch compatible with acute thrombus. No acute infarct or hemorrhage. Patient did not receive TPA as was already on Xarelto chronically. CTA of head and neck showed occlusion of left MCA bifurcation with little flow in the superior and inferior divisions left MCA. No other intracranial stenosis or occlusion. Underwent emergent left MCA and ACA revascularization 01/27/2020 per interventional radiology. Follow-up MRI/MRA showed negative intracranial MRA patent left MCA and ACA. Confluent left basal ganglia infarct with edema and mild swelling. Heidelberg classification 1 a petechial hemorrhage at the caudate. 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 Echocardiogram with ejection fraction of 30% grade 2 diastolic dysfunction. Presently on aspirin 325 mg daily for CVA prophylaxis and awaiting plan to possibly switch to Eliquisonce patient is able to swallow.. Subcutaneous heparin for DVT prophylaxis. Patient is n.p.o. with alternative means of nutritional support. MBS 6/22/ with recommendations for Dysphagia 2 with nectar thin liquids. Meds crushed with puree.  Patient currently requires total with mobility secondary to muscle weakness, impaired timing and sequencing, unbalanced muscle activation, decreased coordination and decreased motor planning and decreased sitting balance, decreased standing balance, decreased postural control, hemiplegia and decreased balance strategies.  Prior to hospitalization, patient was independent  with mobility and  lived with Alone in a House  home.  Home access is 1Stairs to enter.  Patient will benefit from skilled PT intervention to maximize safe functional mobility, minimize fall risk and decrease caregiver burden for planned discharge home with 24 hour assist.  Anticipate patient will benefit from follow up [facility] at discharge.  PT - End of Session Activity Tolerance: Tolerates 30+ min activity with multiple rests Endurance Deficit: Yes Endurance Deficit Description: pt fatigued easily PT Assessment Rehab Potential (ACUTE/IP ONLY): Good PT Barriers to Discharge: Medical stability;Home environment access/layout PT Barriers to Discharge Comments: denise R hemiparesis, aphaisa, pusher PT Patient demonstrates impairments in the following area(s): Balance;Endurance;Motor;Perception;Safety;Sensory PT Transfers Functional Problem(s): Bed Mobility;Bed to Chair;Car;Furniture PT Locomotion Functional Problem(s): Ambulation;Wheelchair Mobility;Stairs PT Plan PT Intensity: Minimum of 1-2 x/day ,45 to 90 minutes PT Frequency: 5 out of 7 days PT Duration Estimated Length of Stay: 3-4 weeks PT Treatment/Interventions: Ambulation/gait training;Discharge planning;Functional mobility training;Psychosocial support;Therapeutic Activities;Balance/vestibular training;Disease management/prevention;Neuromuscular re-education;Skin care/wound management;Therapeutic Exercise;Wheelchair propulsion/positioning;Cognitive remediation/compensation;DME/adaptive equipment instruction;Pain management;Splinting/orthotics;UE/LE Strength taining/ROM;Community reintegration;Functional electrical stimulation;Patient/family education;Stair training;UE/LE Coordination activities PT Transfers Anticipated Outcome(s): min A with LRAD PT Locomotion Anticipated Outcome(s): min A with LRAD PT Recommendation Recommendations for Other Services: Therapeutic Recreation consult Therapeutic Recreation Interventions: Stress management Follow Up  Recommendations: Home health PT Patient destination: Home Equipment Recommended: To be determined Equipment Details: has none  Skilled Therapeutic Intervention Evaluation completed (see details above and below) with education on PT POC and goals and individual treatment initiated with focus on functional mobility/transfers, generalized strengthening, dynamic sitting/standing balance, midline orientation, and improved activity tolerance. Received pt sitting in TIS WC, pt educated on PT evaluation, CIR policies, and therapy schedule and agreeable. Pt with severe aphasia but able to nod head to yes/no questions. Pt denied any pain during session. Pt transported to therapy gym in [facility] total A and transferred sit<>stand with L handrail mod A +2 and required manual facilitation for R knee control and to weight shift to L. Pt ambulated 38f with L handrail max A+2 and required total A for R LE advancement, to prevent R knee buckling/hyperextension, upright posture and trunk control, pelvic rotation and weight shifting to L. Pt began to crouch down and returned to sitting position for safety. Pt transferred TIS WC<>mat squat<>pivot with mod A +2 and transferred sit<>supine mod A +2 for R LE management and trunk control. Pt rolled to L and R with mod A and transferred supine<>sit on mat with max A and cues for logroll technique. Pt able to maintain static sitting balance with mod A fading to CGA; however pt demonstrated R lateral lean. Pt transferred mat<>TIS WC via lateral scoot with max A. Therapist attempted to locate R half lap tray but none available. Pt transported back to room total A. Concluded session with pt semi-reclined in TIS WC, needs within reach, and seatbelt/seatbelt alarm on. Therapist supported R UE with pillows for comfort. Safety plan updated.   PT Evaluation Precautions/Restrictions Precautions Precautions: Fall Precaution Comments: pusher Restrictions Weight Bearing Restrictions: No Home  Living/Prior Functioning Home Living Available Help at Discharge: Family;Friend(s);Available 24 hours/day Type of Home: House Home Access: Stairs to enter ECenter[employer] Energyof Steps: 1 Entrance Stairs-Rails: None Home Layout: One level BBiochemist, clinical Standard Bathroom Accessibility: Yes  Lives With: Alone Prior Function Vocation: Full time employment Comments: pt is a famous sDevelopment worker, community [place] FinancialOverall Cognitive Status: Impaired/Different from baseline Arousal/Alertness: Awake/alert Orientation Level: Other (comment) (difficult to assess due to aphasia) Memory: Impaired Awareness: Impaired Problem Solving: Impaired Safety/Judgment: Impaired  Sensation Sensation Light Touch: Impaired by gross assessment Proprioception: Impaired by gross assessment Additional Comments: decreased along R LE; difficulty communicating due to aphasia Coordination Gross Motor Movements are Fluid and Coordinated: No Fine Motor Movements are Fluid and Coordinated: No Coordination and Movement Description: grossly uncoordinated due to dense R hemiparesis, decreased midline orientation, decreased postural control/balance, and LE weakness Finger Nose Finger Test: mild dysmetria on L UE; unable to perform on R UE Heel Shin Test: decreased on L UE; unable to perform on R UE Motor  Motor Motor: Hemiplegia;Abnormal postural alignment and control Motor - Skilled Clinical Observations: grossly uncoordinated due to dense R hemi, LE weakness, decreased balance/postural control, decreased midline orientation  Mobility Bed Mobility Bed Mobility: Rolling Right;Rolling Left;Sit to Supine;Supine to Sit Rolling Right: Moderate Assistance - Patient 50-74% Rolling Left: Moderate Assistance - Patient 50-74% Supine to Sit: Maximal Assistance - Patient - Patient 25-49% Sit to Supine: 2 Helpers Transfers Transfers: Sit to Stand;Stand to Sit;Lateral/Scoot  Transfers;Squat Pivot Transfers Sit to Stand: 2 Helpers Stand to Sit: 2 Dance movement psychotherapist Transfers: 2 Helpers Lateral/Scoot Transfers: Maximal Assistance - Patient 25-49% Locomotion  Gait Ambulation: Yes Gait Assistance: 2 Holiday representative (Feet): 2 Feet Assistive device: Other (Comment) (rail on L) Gait Assistance Details: Manual facilitation for weight shifting;Manual facilitation for placement;Verbal cues for technique;Manual facilitation for weight bearing Gait Assistance Details: manual facilitation for weight shifting, to block R knee buckling/hyperextension, and for R LE foot advancement. Gait Gait: Yes Gait Pattern: Impaired Gait Pattern: Decreased stance time - right;Decreased dorsiflexion - right;Decreased stride length;Decreased step length - right;Decreased step length - left;Decreased weight shift to left;Lateral trunk lean to right;Trunk flexed;Poor foot clearance - left;Poor foot clearance - right Gait velocity: decresaed  Trunk/Postural Assessment  Cervical Assessment Cervical Assessment: Exceptions to [facility] (forward head) Thoracic Assessment Thoracic Assessment: Exceptions to [facility] (rounded shoulders) Lumbar Assessment Lumbar Assessment: Exceptions to [facility] (posterior pelvic tilt) Postural Control Postural Control: Deficits on evaluation Righting Reactions: delayed Protective Responses: delayed  Balance Balance Balance Assessed: Yes Static Sitting Balance Static Sitting - Balance Support: Bilateral upper extremity supported;Feet supported Static Sitting - Level of Assistance: 3: Mod assist Dynamic Sitting Balance Dynamic Sitting - Balance Support: Bilateral upper extremity supported;Feet supported Dynamic Sitting - Level of Assistance: 3: Mod assist Static Standing Balance Static Standing - Balance Support: Left upper extremity supported (L handrail) Static Standing - Level of Assistance: 1: +2 Total assist Static Standing - Comment/# of Minutes: pushing to  R when standing with L handrail. +2 assist to correct pushing and to control R LE Dynamic Standing Balance Dynamic Standing - Balance Support: Left upper extremity supported (L handrail) Dynamic Standing - Level of Assistance: 1: +2 Total assist Extremity Assessment  RLE Assessment RLE Assessment: Exceptions to [facility] Strength Comments: unable to facilitate active muscle contractions due to dense R hemi LLE Assessment LLE Assessment: Exceptions to [facility] LLE Strength Left Hip Flexion: 3+/5 Left Hip ABduction: 3/5 Left Hip ADduction: 3/5 Left Knee Flexion: 3+/5 Left Knee Extension: 4/5 Left Ankle Dorsiflexion: 3+/5 Left Ankle Plantar Flexion: 4-/5  Refer to Care Plan for Long Term Goals  Recommendations for other services: Therapeutic Recreation  Stress management  Discharge Criteria: Patient will be discharged from PT if patient refuses treatment 3 consecutive times without medical reason, if treatment goals not met, if there is a change in medical status, if patient makes no progress towards goals or if patient is discharged from hospital.  The above assessment, treatment plan, treatment alternatives  and goals were discussed and mutually agreed upon: by patient  Alfonse Alpers PT, DPT  02/03/2020, 7:36 AM

## 2020-02-04 ENCOUNTER — Inpatient Hospital Stay (HOSPITAL_COMMUNITY): Payer: BC Managed Care – PPO

## 2020-02-04 ENCOUNTER — Inpatient Hospital Stay (HOSPITAL_COMMUNITY): Payer: BC Managed Care – PPO | Admitting: Speech Pathology

## 2020-02-04 ENCOUNTER — Inpatient Hospital Stay (HOSPITAL_COMMUNITY): Payer: BC Managed Care – PPO | Admitting: Occupational Therapy

## 2020-02-04 NOTE — Progress Notes (Signed)
Occupational Therapy Session Note  Patient Details  Name: Dustin Schneider MRN: [id number] Date of Birth: 1956/03/12  Today's Date: 02/04/2020 OT Individual Time:0830-0930- 60 minutes  Short Term Goals: Week 1:  OT Short Term Goal 1 (Week 1): Pt will locate all ADL items on R of sink/table with no VC OT Short Term Goal 2 (Week 1): Pt will wash RUE during UB bathing wiht no VC to demo improved R inattention OT Short Term Goal 3 (Week 1): Pt will complete 1/4 steps of UB dressing OT Short Term Goal 4 (Week 1): Pt will sit EOB/EOM statically with no more than MIN A to maintain balance for 3 min  Skilled Therapeutic Interventions/Progress Updates:    Pt greeted semi-reclined in bed and agreeable to OT treatment session with head nod. Pt able to appropriately nod head to questions and follow 1-2 step commands throughout.  Pt rolled in bed with min A to the R and Max A to the L. PT completed bed mobility with max A. Worked on sitting balance at EOB with pt initially pushing, but able to correct with cues. Worked on donning pants at EOB with max A to place R LE on L knee. Worked on anterior weight shift to then step into pants on L side. Sit<>stand in [place] with Mod A to then pull up pants. Stedy transfer to the sink. Worked on sitting balance perched on [place] with mirror feedback to promote midline orientation. Worked on attention to R UE during dressing tasks. Pt set-up with Saebo e-stim. OT placed SAEBO e-stim on wrist extensors. SAEBO left on for 60 minutes. OT returned to remove SAEBO with skin intact and no adverse reactions.  Saebo Stim One 330 pulse width 35 Hz pulse rate On 8 sec/ off 8 sec Ramp up/ down 2 sec Symmetrical Biphasic wave form  Max intensity 172m at 500 Ohm load  Pt left in TIS with chair alarm on, call bell in reach, and needs met.    Therapy Documentation Precautions:  Precautions Precautions: Fall Precaution Comments: pusher, dense R hemiparesis Restrictions Weight  Bearing Restrictions: No Pain: Pain Assessment Pain Scale: 0-10 Pain Score: 0-No pain  Therapy/Group: Individual Therapy  EValma Cava6/24/2021, 9:34 AM

## 2020-02-04 NOTE — Progress Notes (Signed)
[place] PHYSICAL MEDICINE & REHABILITATION PROGRESS NOTE  Subjective/Complaints: Patient seen sitting up in bed this morning. He indicates he slept well overnight.  No reported issues.  Discussed bracing with patient.  ROS: Limited due to aphasia.   Objective: Vital Signs: Blood pressure 111/76, pulse 61, temperature 98.9 F (37.2 C), temperature source Oral, resp. rate 18, height 5\' 9"  (1.753 m), weight 79.8 kg, SpO2 92 %. No results found. Recent Labs    02/02/20 0558 02/03/20 0542  WBC 9.3 11.5*  HGB 13.8 13.7  HCT 42.5 42.0  PLT 206 214   Recent Labs    02/02/20 0558 02/03/20 0542  NA 142 141  K 3.8 4.8  CL 103 101  CO2 30 30  GLUCOSE 115* 118*  BUN 24* 28*  CREATININE 1.02 1.14  CALCIUM 9.1 9.2    Physical Exam: BP 111/76 (BP Location: Left Arm)   Pulse 61   Temp 98.9 F (37.2 C) (Oral)   Resp 18   Ht 5\' 9"  (1.753 m)   Wt 79.8 kg   SpO2 92%   BMI 25.98 kg/m  Constitutional: No distress . Vital signs reviewed. HENT: Normocephalic.  Atraumatic. +NG.  Eyes: EOMI. No discharge. Cardiovascular: No JVD.  RRR. Respiratory: Normal effort.  No stridor.  Bilaterally clear to auscultation. GI: Non-distended. Skin: Warm and dry.  Intact. Psych: Unable to assess due to aphasia. Musc: No edema in extremities.  No tenderness in extremities. Neuro: Alert Expressive greater than receptive aphasia, unchanged Motor: Appears to be 5/5 on left side and 0/5 on right side  Assessment/Plan: 1. Functional deficits secondary to left MCA infarct which require 3+ hours per day of interdisciplinary therapy in a comprehensive inpatient rehab setting.  Physiatrist is providing close team supervision and 24 hour management of active medical problems listed below.  Physiatrist and rehab team continue to assess barriers to discharge/monitor patient progress toward functional and medical goals  Care Tool:  Bathing    Body parts bathed by patient: Right arm, Chest, Abdomen,  Front perineal area, Left upper leg, Left lower leg, Face   Body parts bathed by helper: Left arm, Buttocks, Right upper leg, Right lower leg     Bathing assist Assist Level: Maximal Assistance - Patient 24 - 49%     Upper Body Dressing/Undressing Upper body dressing   What is the patient wearing?: Hospital gown only    Upper body assist Assist Level: Total Assistance - Patient < 25%    Lower Body Dressing/Undressing Lower body dressing      What is the patient wearing?: Hospital gown only, Incontinence brief     Lower body assist Assist for lower body dressing: Total Assistance - Patient < 25%     Toileting Toileting    Toileting assist Assist for toileting: Maximal Assistance - Patient 25 - 49%     Transfers Chair/bed transfer  Transfers assist  Chair/bed transfer activity did not occur: Safety/medical concerns  Chair/bed transfer assist level: 2 Helpers     Locomotion Ambulation   Ambulation assist      Assist level: 2 helpers Assistive device: Other (comment) (rail on L) Max distance: 50ft   Walk 10 feet activity   Assist  Walk 10 feet activity did not occur: Safety/medical concerns (R hemi, LE weakness, decreased balance/postural control)        Walk 50 feet activity   Assist Walk 50 feet with 2 turns activity did not occur: Safety/medical concerns (R hemi, LE weakness, decreased balance/postural control)  Walk 150 feet activity   Assist Walk 150 feet activity did not occur: Safety/medical concerns (R hemi, LE weakness, decreased balance/postural control)         Walk 10 feet on uneven surface  activity   Assist Walk 10 feet on uneven surfaces activity did not occur: Safety/medical concerns (R hemi, LE weakness, decreased balance/postural control)         Wheelchair     Assist Will patient use wheelchair at discharge?: Yes Type of Wheelchair: Manual    Wheelchair assist level: Dependent - Patient 0%       Wheelchair 50 feet with 2 turns activity    Assist        Assist Level: Dependent - Patient 0%   Wheelchair 150 feet activity     Assist     Assist Level: Dependent - Patient 0%      Medical Problem List and Plan: 1.  Impaired mobility and ADLs secondary to left MCA ischemic stroke  Continue CIR  WHO/PRAFO ordered 2.  Antithrombotics: -DVT/anticoagulation:  Pharmaceutical: Other (comment)--Eliquis             -antiplatelet therapy: N/A 3. Pain Management: Tylenol prn.  4. Mood: LCSW to follow for evaluation and support.              -antipsychotic agents: N/A 5. Neuropsych: This patient is not capable of making decisions on his own behalf. 6. Skin/Wound Care: N/A 7. Fluids/Electrolytes/Nutrition: Monitor I/Os. 8. PAF: Monitor HR tid--has been off Tikosyn since admission. Continue metoprolol bid with Eliquis.   Await further cardiology recs  ECG reviewed, normal sinus rhythm 9. HTN: Monitor BP--managed on metoprolol and Avapro. Has been elevated.   Controlled on 6/24  Monitor with increased mobility 10. Dyslipidemia: Continue Crestor. 11.  Post stroke dysphagia: Dysphagia 2, nectar liquids with chin tuck.   Hold tube feeds and start calorie count.   Continue water flushes for now.   Advance diet as tolerated  12. Impaired fasting glucose: Likely secondary to tube feeds  Continue to monitor 13. Nonischemic cardiomyopathy: outpatient cardiology follow-up. 14.  Hypoalbuminemia  Supplement initiated on 6/23 15.  Leukocytosis  WBCs 11.5 on 6/23, labs ordered for tomorrow  Afebrile  Continue to monitor 16.  AKI  Creatinine 1.14 on 6/23, labs ordered for tomorrow  Encourage fluids  Will consider IVF if necessary  Continue to monitor  LOS: 2 days A FACE TO FACE EVALUATION WAS PERFORMED  Mackensie Pilson [name] 02/04/2020, 10:56 AM

## 2020-02-04 NOTE — Progress Notes (Signed)
Inpatient Rehabilitation Care Coordinator Assessment and Plan  Patient Details  Name: Dustin Schneider MRN: [id number] Date of Birth: [birth year]  Today's Date: 02/04/2020  Problem List:  Patient Active Problem List   Diagnosis Date Noted  . AKI (acute kidney injury) ([place])   . Global aphasia   . Leukocytosis   . Hypoalbuminemia due to protein-calorie malnutrition ([place])   . Hyperglycemia   . Dysphagia, post-stroke   . PAF (paroxysmal atrial fibrillation) ([place])   . Hyperlipidemia 02/02/2020  . Dysphagia following cerebral infarction 02/02/2020  . Aspiration pneumonia ([place]) 02/02/2020  . Acute ischemic left MCA stroke ([place]) 02/02/2020  . Acute respiratory failure ([name])   . Acute ischemic stroke [facility]) s/p clot retrieval L MCA & ACA A3 01/27/2020  . Aspiration into airway 01/27/2020  . Chronic anticoagulation 01/27/2020  . Paroxysmal atrial fibrillation ([place]) 06/15/2018  . Essential hypertension 06/15/2018  . NICM (nonischemic cardiomyopathy) ([place]) 06/15/2018  . Visit for monitoring Tikosyn therapy 06/12/2018   Past Medical History:  Past Medical History:  Diagnosis Date  . Hypertension   . NICM (nonischemic cardiomyopathy) ([place]) 06/15/2018   04/23/18: EF 45%, 09/15/18: NOrmal LVEF  . Paroxysmal atrial fibrillation ([place]) 06/15/2018  . Visit for monitoring Tikosyn therapy 06/12/2018   Past Surgical History:  Past Surgical History:  Procedure Laterality Date  . CARDIOVERSION N/A 05/13/2018   Procedure: CARDIOVERSION;  Surgeon: [name], MD;  Location: [facility] ENDOSCOPY;  Service: Cardiovascular;  Laterality: N/A;  . CARDIOVERSION N/A 06/13/2018   Procedure: CARDIOVERSION;  Surgeon: [name], MD;  Location: [facility] ENDOSCOPY;  Service: Cardiovascular;  Laterality: N/A;  . CARDIOVERSION N/A 06/23/2018   Procedure: CARDIOVERSION;  Surgeon: [name], MD;  Location: [place];  Service: Cardiovascular;  Laterality: N/A;  . excision of actinic keratosis    . EYE SURGERY     eye lift   . IR CT HEAD LTD  01/27/2020  . IR PERCUTANEOUS ART THROMBECTOMY/INFUSION INTRACRANIAL INC DIAG ANGIO  01/27/2020      . IR PERCUTANEOUS ART THROMBECTOMY/INFUSION INTRACRANIAL INC DIAG ANGIO  01/27/2020  . RADIOLOGY WITH ANESTHESIA N/A 01/27/2020   Procedure: IR WITH ANESTHESIA;  Surgeon: Radiologist, Medication, MD;  Location: [place];  Service: Radiology;  Laterality: N/A;  . TONSILLECTOMY    . WISDOM TOOTH EXTRACTION     Social History:  reports that he has never smoked. He has never used smokeless tobacco. He reports current alcohol use. He reports that he does not use drugs.  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 Read: Yes Write: Yes Employment Status: Employed Name of Employer: Writer [employer] Return to Work Plans: Would like to return to work at [employer]   Abuse/Neglect Abuse/Neglect Assessment Can Be Completed: Yes Physical Abuse: Denies Verbal Abuse: Denies Sexual Abuse: Denies Exploitation of patient/patient's resources: Denies Self-Neglect: Denies  Emotional Status [name] affect, behavior and adjustment status: no Recent Psychosocial Issues: no Psychiatric History: no Substance Abuse History: no  Patient / Family Perceptions, Expectations & Goals Pt/Family understanding of illness & functional limitations: yes Pt/family expectations/goals: Goal to reach goal level.  Brother looking to further  patient's rehabilitation.  Community Resources Express Scripts: None Premorbid Home Care/DME Agencies: None Transportation available at discharge: Patient has a truck, significant other has 2 Scientist, research (medical): Spouse/significant other Support Systems: Spouse/significant other, Other relatives Type of Residence: Private residence ([address], 2 Steps to front door. 2 steps down once you enter, no railings) Does the patient have any problems obtaining your medications?: No Care Coordinator Barriers to Discharge: Inaccessible home environment Care Coordinator Anticipated Follow Up Needs: HH/OP Expected length of stay: 2-3 Weeks  Clinical Impression Sw followed up with patient significant other. Entered patient room patient at bedside having breakfast. Significant other, brother and sister looking to further patients rehab upon discharge. Sw will provide SNF options and discuss options with family. Brother would like follow up from [facility].   Dyanne Iha 02/04/2020, 9:01 AM

## 2020-02-04 NOTE — Progress Notes (Signed)
Physical Therapy Session Note  Patient Details  Name: Dustin Schneider MRN: [id number] Date of Birth: [birth year]  Today's Date: 02/04/2020 PT Individual Time: 1415-1445 PT Individual Time Calculation (min): 30 min   Short Term Goals: Week 1:  PT Short Term Goal 1 (Week 1): Pt will transfer supine<>sitting EOB with mod A PT Short Term Goal 2 (Week 1): Pt will transfer bed<>chair with LRAD mod A PT Short Term Goal 3 (Week 1): Pt will perform simulated car transfer with LRAD max A Week 2:    Week 3:     Skilled Therapeutic Interventions/Progress Updates:     PAIN denies pain Pt initially awake, alert, supine, nonverbal.  Brother in law at bedside.   Pt supine to R side to sit w/mod to max assist.  Initially leaning moderately to R and pushing mildly w/LUE. Worked on midline orientation using multimodal cues and reaching w/LUE to prevent pushing.  Pt engaged in progressive excursions from midline and then advanced to crossing midline w/LUE as well as reaching forward to floor and forward across midline to floor.  Return to sitting pt attempts to power up using LUE, encouraged pt to perform w/increased trunk activation, performs w/min assist.   Emphasis on regaining midline between each excursion.  STS from bed w/mod assist of 2 and worked on midline orientation in standing, multimodal cues for upright/midline and therapist stabilizing RLE.  Family member stood in front of patient for midline visual orientation and pt advanced to reaching forward and upwards w/LUE to touch family member on shoulders/head w/return to midline, mod assist of 2 overall, cues for protraction at L hip and upright trunk.  Squat pivot bed to wc w/mod assist of 1.  Pt repositioned in wc w/cues for sequencing, min assist for balance. Pt left oob in wc w/alarm belt set and needs in reach   Therapy Documentation Precautions:  Precautions Precautions: Fall Precaution Comments: pusher, dense R  hemiparesis Restrictions Weight Bearing Restrictions: No    Therapy/Group: Individual Therapy  [name], [place] 02/04/2020, 4:32 PM

## 2020-02-04 NOTE — Progress Notes (Signed)
Orthopedic Tech Progress Note Patient Details:  BLUFORD SEDLER 01/04/1956 [id number] Called in order to [facility]. Patient ID: OTIS PORTAL, male   DOB: Dec 18, 1955, 64 y.o.   MRN: [id number]   Chip Boer 02/04/2020, 11:22 AM

## 2020-02-04 NOTE — Progress Notes (Signed)
Speech Language Pathology Daily Session Note  Patient Details  Name: Dustin Schneider MRN: [id number] Date of Birth: [birth year]  Today's Date: 02/04/2020 SLP Individual Time: 1110-1155 SLP Individual Time Calculation (min): 45 min  Short Term Goals: Week 1: SLP Short Term Goal 1 (Week 1): Pt will consume current diet with minimal overt s/sx aspiration, efficient mastication and oral clearance with Supervision A verbal cues for use of strategies. SLP Short Term Goal 2 (Week 1): Pt will consume trials of thin liquids with minimal overt s/sx aspiration over 3 sessions prior to advancement. SLP Short Term Goal 3 (Week 1): Pt will consume trials of dysphagia 3 (mech soft) solids demonstrating efficient mastication and oral clearance X2 prior to advancement. SLP Short Term Goal 4 (Week 1): Pt will use multimodal means of communication to express basic wants and needs with Max A multimodal cues. SLP Short Term Goal 5 (Week 1): Pt will participate in automatic speech tasks with 80% accuracy provided Max A multimodal cues. SLP Short Term Goal 6 (Week 1): Pt will verbalize at the word level with Max A multimodal cues.  Skilled Therapeutic Interventions: Skilled treatment session focused on communication goals. SLP facilitated session by providing visual and phonemic cues for patient to complete automatic speech tasks (happy birthday, counting 1-10, days of week) with 70% accuracy. SLP also facilitated session by providing a basic communication board of written words that patient was able to utilize with extra time. Patient independently utilized gestures X 2 occassions and was able to make his needs known (pointing to extra pens when SLP reported she couldn't find hers and pointing under the bed when returning to room so SLP could pick up his glasses he dropped earlier in the day).  Patient left upright in the wheelchair with the alarm on and all needs within reach. Continue with current plan of care.       Pain Pain Assessment Pain Scale: 0-10 Pain Score: 0-No pain  Therapy/Group: Individual Therapy  Dustin Schneider 02/04/2020, 11:57 AM

## 2020-02-04 NOTE — Progress Notes (Signed)
Occupational Therapy Session Note  Patient Details  Name: Dustin Schneider MRN: [id number] Date of Birth: [birth year]  Today's Date: 02/04/2020 OT Individual Time: 1500-1600 OT Individual Time Calculation (min): 60 min    Short Term Goals: Week 1:  OT Short Term Goal 1 (Week 1): Pt will locate all ADL items on R of sink/table with no VC OT Short Term Goal 2 (Week 1): Pt will wash RUE during UB bathing wiht no VC to demo improved R inattention OT Short Term Goal 3 (Week 1): Pt will complete 1/4 steps of UB dressing OT Short Term Goal 4 (Week 1): Pt will sit EOB/EOM statically with no more than MIN A to maintain balance for 3 min  Skilled Therapeutic Interventions/Progress Updates:    1:1. Pt received in TIS agreeable to OT with brother in law, [name], present. Pt completes transfer in stedy (MOD A lifting) to EOM next to wall on L for anchoring to decrease pushing tendencies. Pt completes seated dynamic reaching tasks anterior, crossing midline and laterally in B directions seated EOM and perched on stedy for improvement of trunk control with RUE weight bearing into bolster/OT thigh/stedy bar while reaching for clothes pins or bean bags. Pt provided with tactile input at elbow and at R trunk for lengthening. Pt completes dynavision 2x1 min (1 min seated in TIS, 1 min perched on stedy-min A) with 1.6 average reaction time in both trials and no discrepency in RvL time except for far R ring. Exited session wih tpt seated in TIS, exit alarm on and call light in reach  Therapy Documentation Precautions:  Precautions Precautions: Fall Precaution Comments: pusher, dense R hemiparesis Restrictions Weight Bearing Restrictions: No General:   Vital Signs: Therapy Vitals Temp: 97.7 F (36.5 C) Temp Source: Oral Pulse Rate: 63 Resp: 18 BP: (!) 114/55 Patient Position (if appropriate): Sitting Oxygen Therapy SpO2: 94 % O2 Device: Room Air Pain:   ADL: ADL Eating: Moderate cueing,  Supervision/safety Grooming: Minimal assistance Where Assessed-Grooming: Sitting at sink Upper Body Bathing: Minimal assistance Where Assessed-Upper Body Bathing: Bed level Lower Body Bathing: Maximal assistance Where Assessed-Lower Body Bathing: Bed level Upper Body Dressing: Maximal assistance Where Assessed-Upper Body Dressing: Bed level Lower Body Dressing: Maximal assistance Where Assessed-Lower Body Dressing: Bed level Toilet Transfer:  (MOD A STS in stedy) Vision   Perception    Praxis   Exercises:   Other Treatments:     Therapy/Group: Individual Therapy  Tonny Branch 02/04/2020, 4:11 PM

## 2020-02-05 ENCOUNTER — Inpatient Hospital Stay (HOSPITAL_COMMUNITY): Payer: BC Managed Care – PPO

## 2020-02-05 ENCOUNTER — Inpatient Hospital Stay (HOSPITAL_COMMUNITY): Payer: BC Managed Care – PPO | Admitting: Speech Pathology

## 2020-02-05 ENCOUNTER — Inpatient Hospital Stay (HOSPITAL_COMMUNITY): Payer: BC Managed Care – PPO | Admitting: Occupational Therapy

## 2020-02-05 LAB — CBC WITH DIFFERENTIAL/PLATELET
Abs Immature Granulocytes: 0.09 10*3/uL — ABNORMAL HIGH (ref 0.00–0.07)
Basophils Absolute: 0.1 10*3/uL (ref 0.0–0.1)
Basophils Relative: 1 %
Eosinophils Absolute: 0.2 10*3/uL (ref 0.0–0.5)
Eosinophils Relative: 2 %
HCT: 42 % (ref 39.0–52.0)
Hemoglobin: 13.8 g/dL (ref 13.0–17.0)
Immature Granulocytes: 1 %
Lymphocytes Relative: 18 %
Lymphs Abs: 2 10*3/uL (ref 0.7–4.0)
MCH: 31.6 pg (ref 26.0–34.0)
MCHC: 32.9 g/dL (ref 30.0–36.0)
MCV: 96.1 fL (ref 80.0–100.0)
Monocytes Absolute: 0.9 10*3/uL (ref 0.1–1.0)
Monocytes Relative: 9 %
Neutro Abs: 7.4 10*3/uL (ref 1.7–7.7)
Neutrophils Relative %: 69 %
Platelets: 266 10*3/uL (ref 150–400)
RBC: 4.37 MIL/uL (ref 4.22–5.81)
RDW: 13.8 % (ref 11.5–15.5)
WBC: 10.7 10*3/uL — ABNORMAL HIGH (ref 4.0–10.5)
nRBC: 0 % (ref 0.0–0.2)

## 2020-02-05 LAB — BASIC METABOLIC PANEL
Anion gap: 12 (ref 5–15)
BUN: 36 mg/dL — ABNORMAL HIGH (ref 8–23)
CO2: 25 mmol/L (ref 22–32)
Calcium: 8.9 mg/dL (ref 8.9–10.3)
Chloride: 103 mmol/L (ref 98–111)
Creatinine, Ser: 1.01 mg/dL (ref 0.61–1.24)
GFR calc Af Amer: >60 mL/min (ref >60)
GFR calc non Af Amer: >60 mL/min (ref >60)
Glucose, Bld: 129 mg/dL — ABNORMAL HIGH (ref 70–99)
Potassium: 4.1 mmol/L (ref 3.5–5.1)
Sodium: 140 mmol/L (ref 135–145)

## 2020-02-05 MED ORDER — FREE WATER
100.0000 mL | Freq: Three times a day (TID) | Status: DC
  Administered 2020-02-05 – 2020-02-08 (×10): 100 mL

## 2020-02-05 NOTE — Progress Notes (Signed)
[place] PHYSICAL MEDICINE & REHABILITATION PROGRESS NOTE  Subjective/Complaints: Patient seen sitting up in bed this morning eating breakfast.  Nursing at bedside.  Patient indicates he slept well overnight.  No reported issues overnight by nursing.  Nursing indicates patient wore his orthoses overnight.  ROS: Limited due to aphasia  Objective: Vital Signs: Blood pressure 115/82, pulse 89, temperature 98.3 F (36.8 C), resp. rate 15, height 5\' 9"  (1.753 m), weight 79.8 kg, SpO2 94 %. No results found. Recent Labs    02/03/20 0542 02/05/20 0642  WBC 11.5* 10.7*  HGB 13.7 13.8  HCT 42.0 42.0  PLT 214 266   Recent Labs    02/03/20 0542 02/05/20 0642  NA 141 140  K 4.8 4.1  CL 101 103  CO2 30 25  GLUCOSE 118* 129*  BUN 28* 36*  CREATININE 1.14 1.01  CALCIUM 9.2 8.9    Physical Exam: BP 115/82 (BP Location: Left Arm)   Pulse 89   Temp 98.3 F (36.8 C)   Resp 15   Ht 5\' 9"  (1.753 m)   Wt 79.8 kg   SpO2 94%   BMI 25.98 kg/m  Constitutional: No distress . Vital signs reviewed. HENT: Normocephalic.  Atraumatic.  + NG. Eyes: EOMI. No discharge. Cardiovascular: No JVD.  Irregularly irregular. Respiratory: Normal effort.  No stridor.  Bilaterally clear to auscultation. GI: Non-distended. Skin: Warm and dry.  Intact. Psych: Unable to assess due to aphasia. Musc: No edema in extremities.  No tenderness in extremities. Neuro: Alert Expressive greater than receptive aphasia, unchanged Motor: Appears to be 5/5 on left side and 0/5 on right side, unchanged  Assessment/Plan: 1. Functional deficits secondary to left MCA infarct which require 3+ hours per day of interdisciplinary therapy in a comprehensive inpatient rehab setting.  Physiatrist is providing close team supervision and 24 hour management of active medical problems listed below.  Physiatrist and rehab team continue to assess barriers to discharge/monitor patient progress toward functional and medical  goals  Care Tool:  Bathing    Body parts bathed by patient: Right arm, Chest, Abdomen, Front perineal area, Left upper leg, Left lower leg, Face   Body parts bathed by helper: Left arm, Buttocks, Right upper leg, Right lower leg     Bathing assist Assist Level: Maximal Assistance - Patient 24 - 49%     Upper Body Dressing/Undressing Upper body dressing   What is the patient wearing?: Pull over shirt    Upper body assist Assist Level: Total Assistance - Patient < 25%    Lower Body Dressing/Undressing Lower body dressing      What is the patient wearing?: Incontinence brief, Pants     Lower body assist Assist for lower body dressing: Total Assistance - Patient < 25%     Toileting Toileting    Toileting assist Assist for toileting: Maximal Assistance - Patient 25 - 49%     Transfers Chair/bed transfer  Transfers assist  Chair/bed transfer activity did not occur: Safety/medical concerns  Chair/bed transfer assist level: 2 Helpers     Locomotion Ambulation   Ambulation assist      Assist level: 2 helpers Assistive device: Other (comment) (rail on L) Max distance: 62ft   Walk 10 feet activity   Assist  Walk 10 feet activity did not occur: Safety/medical concerns (R hemi, LE weakness, decreased balance/postural control)        Walk 50 feet activity   Assist Walk 50 feet with 2 turns activity did not occur:  Safety/medical concerns (R hemi, LE weakness, decreased balance/postural control)         Walk 150 feet activity   Assist Walk 150 feet activity did not occur: Safety/medical concerns (R hemi, LE weakness, decreased balance/postural control)         Walk 10 feet on uneven surface  activity   Assist Walk 10 feet on uneven surfaces activity did not occur: Safety/medical concerns (R hemi, LE weakness, decreased balance/postural control)         Wheelchair     Assist Will patient use wheelchair at discharge?: Yes Type of  Wheelchair: Manual    Wheelchair assist level: Dependent - Patient 0%      Wheelchair 50 feet with 2 turns activity    Assist        Assist Level: Dependent - Patient 0%   Wheelchair 150 feet activity     Assist     Assist Level: Dependent - Patient 0%      Medical Problem List and Plan: 1.  Impaired mobility and ADLs secondary to left MCA ischemic stroke  Continue CIR  WHO/PRAFO nightly 2.  Antithrombotics: -DVT/anticoagulation:  Pharmaceutical: Other (comment)--Eliquis             -antiplatelet therapy: N/A 3. Pain Management: Tylenol prn.  4. Mood: LCSW to follow for evaluation and support.              -antipsychotic agents: N/A 5. Neuropsych: This patient is not capable of making decisions on his own behalf. 6. Skin/Wound Care: N/A 7. Fluids/Electrolytes/Nutrition: Monitor I/Os. 8. PAF: Monitor HR tid--has been off Tikosyn since admission. Continue metoprolol bid with Eliquis.   Continue to await further cardiology recs  ECG reviewed, normal sinus rhythm, however in A. fib on exam morning of 6/25 9. HTN: Monitor BP--managed on metoprolol and Avapro. Has been elevated.   Controlled on 6/25  Monitor with increased mobility 10. Dyslipidemia: Continue Crestor. 11.  Post stroke dysphagia: Dysphagia 2, nectar liquids with chin tuck.   Hold tube feeds and start calorie count.   Continue water flushes for now.   Advance diet as tolerated  12. Impaired fasting glucose: Likely secondary to tube feeds  Elevated on 6/25  Continue to monitor 13. Nonischemic cardiomyopathy: outpatient cardiology follow-up. 14.  Hypoalbuminemia  Supplement initiated on 6/23 15.  Leukocytosis  WBCs 10.7 on 6/25  Afebrile  Continue to monitor 16.  AKI  Creatinine 1.10 on 6/25  Encourage fluids  Will consider IVF if necessary  Continue to monitor  LOS: 3 days A FACE TO FACE EVALUATION WAS PERFORMED  Dustin Schneider Dustin Schneider 02/05/2020, 10:43 AM

## 2020-02-05 NOTE — Progress Notes (Signed)
Speech Language Pathology Daily Session Note  Patient Details  Name: Dustin Schneider MRN: [id number] Date of Birth: Apr 17, 1956  Today's Date: 02/05/2020 SLP Individual Time: 4166-0630 SLP Individual Time Calculation (min): 55 min  Short Term Goals: Week 1: SLP Short Term Goal 1 (Week 1): Pt will consume current diet with minimal overt s/sx aspiration, efficient mastication and oral clearance with Supervision A verbal cues for use of strategies. SLP Short Term Goal 2 (Week 1): Pt will consume trials of thin liquids with minimal overt s/sx aspiration over 3 sessions prior to advancement. SLP Short Term Goal 3 (Week 1): Pt will consume trials of dysphagia 3 (mech soft) solids demonstrating efficient mastication and oral clearance X2 prior to advancement. SLP Short Term Goal 4 (Week 1): Pt will use multimodal means of communication to express basic wants and needs with Max A multimodal cues. SLP Short Term Goal 5 (Week 1): Pt will participate in automatic speech tasks with 80% accuracy provided Max A multimodal cues. SLP Short Term Goal 6 (Week 1): Pt will verbalize at the word level with Max A multimodal cues.  Skilled Therapeutic Interventions: Pt was seen for skilled ST targeting communication goals. Pt used a word communication board with fluctuating levels of accuracy. When cues used more abstract language (what if you wanted something to eat) pt with 5/6 accuracy then 4/6 accuracy. When prompted with the same words on the board (what if you were hungry) pt able to use it with 100% accuracy. During automatic speech tasks pt counted from 1-5 without any cues. When provided with a visual written cue and Mod phonetic cues, he increased verbalizations to include numbers 1-8. Still unsuccessful with verbalizing his name regardless of cueing method. Pt produced CV and CVC words with beginning sounds (/b/, /m/, /oo/) with 25% accuracy with a written cue as well as sentence completion and phonetic cues. Pt  left laying in bed with alarm set and needs within reach. Continue per current plan of care.          Pain Pain Assessment Pain Scale: 0-10 Pain Score: 0-No pain  Therapy/Group: Individual Therapy  Arbutus Leas 02/05/2020, 5:22 AM

## 2020-02-05 NOTE — Progress Notes (Signed)
Physical Therapy Session Note  Patient Details  Name: Dustin Schneider MRN: [id number] Date of Birth: 1956-03-06  Today's Date: 02/05/2020 PT Individual Time: 1315-1430 PT Individual Time Calculation (min): 75 min   Short Term Goals: Week 1:  PT Short Term Goal 1 (Week 1): Pt will transfer supine<>sitting EOB with mod A PT Short Term Goal 2 (Week 1): Pt will transfer bed<>chair with LRAD mod A PT Short Term Goal 3 (Week 1): Pt will perform simulated car transfer with LRAD max A Week 2:    Week 3:     Skilled Therapeutic Interventions/Progress Updates:    PAIN: Pt denies pain. Pt initially supine and nods in agreement to PT session.  Dons pants w/max assist for RLE, lifts LLE for therapist to thread pants then pt half bridges to raise L side of pants, full bridge when therapist stabilizes RLE and pt able to raise pants completely.  Supine to sit w/mod assist of 1, initially pushes w/LUE, leans post L but corrects w/visual and verbal cues, decreased tendency when L hand occupied. SPT to wc w/max assist of 1, cues for hand placement and wt shifting w/transition.  Gait 52ft  using L handrail w/mirror for feedback, multimodal cues, max assist to advance RLE and stabilize w/stance, multimodal cues to facilitate wt shifting, sequencing, posture. Tendency for R ankle to roll into inversion w/loading, tendency to collapse at hip/knee requiring max assist to stabilize.  Worked on maintaining trunk extension and P/A wt shifting onto RLE vs leaning to R/trunk & hip flexion when wt shifting to R which is tendency.  Used mirror to demonstrate and max verbal and tactile cueing to facilitate. Second person following closely w/wc for safety. Repeated gait as above x 78ft. Therapist provided trial RAFO and pt educated regarding use.  Therapist donned w/tennis shoes then transitioned to gait in parallel bars due to no +2 assist available   Pt demonstrated improved wt shifting/comprehension of goal/carryover but  still w/max cues and assist.  Gait 10ft as above, much improved R ankle stability and decreased work of therapist to advance and position RLE. Pt then STS x 2 in parallel bars w/mod assit and cues for posture.  Pt very fatigued following session.  Transported back to room.  Squat pvt to L side w/mod assist of 1, min assist and much cueing to maintain midline w/sitting.  Sit to supine w/max assist. Pt left supine w/rails up x 4, alarm set, bed in lowest position, and needs in reach.   Therapy Documentation Precautions:  Precautions Precautions: Fall Precaution Comments: pusher, dense R hemiparesis Restrictions Weight Bearing Restrictions: No    Therapy/Group: Individual Therapy  [name], [place] 02/05/2020, 2:51 PM

## 2020-02-05 NOTE — Progress Notes (Signed)
Discussed patient with Dr. [name]. Patient may go in and out of A fib and as long as patient asymptomatic/ no symptoms reported--we can continue to monitor. He will admit patient to Tele at a later date to resume Tikosyn.

## 2020-02-05 NOTE — Progress Notes (Signed)
Occupational Therapy Session Note  Patient Details  Name: Dustin Schneider MRN: [id number] Date of Birth: 1955/10/24  Today's Date: 02/05/2020 OT Individual Time: 1122-1205 OT Individual Time Calculation (min): 43 min    Short Term Goals: Week 1:  OT Short Term Goal 1 (Week 1): Pt will locate all ADL items on R of sink/table with no VC OT Short Term Goal 2 (Week 1): Pt will wash RUE during UB bathing wiht no VC to demo improved R inattention OT Short Term Goal 3 (Week 1): Pt will complete 1/4 steps of UB dressing OT Short Term Goal 4 (Week 1): Pt will sit EOB/EOM statically with no more than MIN A to maintain balance for 3 min  Skilled Therapeutic Interventions/Progress Updates:    Treatment session with focus on ADL retraining and sitting balance to increasing independence during ADLs. Completed supine to sit at EOB with modA to shift R hips to EOB and verbal cues for motor planning and sequencing due to R hemiplegia. Facilitated bathing at EOB to increase dynamic sitting balance challenge. Washed UB at EOB with with maxA to wash LUE and provide sitting balance support. Provided minA increasing to modA to maintain sitting balance due to dense R lean and decreased dynamic sitting balance. Provided multimodal cues and use of visual supports to maintain midline orientation. Provided handout on positioning for strokes for care team and family and facilitated bed positioning for improved positioning and alignment of RUE and LUE. Ended session with pt semi-reclined in bed with all needs within reach and bed alarm on.  Therapy Documentation Precautions:  Precautions Precautions: Fall Precaution Comments: pusher, dense R hemiparesis Restrictions Weight Bearing Restrictions: No Pain: Pain Assessment Pain Scale: 0-10 Pain Score: 0-No pain  Therapy/Group: Individual Therapy  Michelle Nasuti 02/05/2020, 12:12 PM

## 2020-02-05 NOTE — Progress Notes (Signed)
Occupational Therapy Session Note  Patient Details  Name: Dustin Schneider MRN: [id number] Date of Birth: June 20, 1956  Today's Date: 02/05/2020 OT Individual Time: 1230-1257 OT Individual Time Calculation (min): 27 min    Short Term Goals: Week 1:  OT Short Term Goal 1 (Week 1): Pt will locate all ADL items on R of sink/table with no VC OT Short Term Goal 2 (Week 1): Pt will wash RUE during UB bathing wiht no VC to demo improved R inattention OT Short Term Goal 3 (Week 1): Pt will complete 1/4 steps of UB dressing OT Short Term Goal 4 (Week 1): Pt will sit EOB/EOM statically with no more than MIN A to maintain balance for 3 min  Skilled Therapeutic Interventions/Progress Updates:    Pt greeted semi-reclined in bed asleep, easy to wake and agreeable to OT treatment session. Treatment session focused on R UE NMR and NMES. Pt educated on self ROM techniques which he demonstrated understanding, 3 sets of 10 self elbow flex/ext and shoulder flex ext. OT then provided joint input through wrist and elbow for neuro re-ed and facilitated elbow flex/ext, and shoulder flex/ext. OT had pt bring L hand beside R and try to simultaneously flex fingers. Pt was able to wiggle his first finger and then slightly flex his middle finger, movements were intentional! OT placed SAEBO e-stim on wrist extensors. SAEBO left on for 60 minutes. OT returned to remove SAEBO with skin intact and no adverse reactions.  Sa[place]o Stim One 330 pulse width 35 Hz pulse rate On 8 sec/ off 8 sec Ramp up/ down 2 sec Symmetrical Biphasic wave form  Max intensity 191mA at 500 Ohm load  Therapy Documentation Precautions:  Precautions Precautions: Fall Precaution Comments: pusher, dense R hemiparesis Restrictions Weight Bearing Restrictions: No Pain: Pain Assessment Pain Scale: 0-10 Pain Score: 0-No pain   Therapy/Group: Individual Therapy  Valma Cava 02/05/2020, 12:52 PM

## 2020-02-05 NOTE — Care Management (Signed)
  Patient ID: Dustin Schneider, male   DOB: 28-Dec-1955, 64 y.o.   MRN: [id number] Met with the patient to review role of CM in collaboration with SW Margreta Journey) to address nursing issues and facilitate prep for discharge. Reviewed risk factors for stroke and management of HTN, HLD and PAF. Patient given handouts on dyslipidemia, A-fib and dietary recommendations for management of HLD and HTN once eating a regular consistency diet. Currently on D2-nectar diet. Continue to follow along with the patient to discharge  For nursing issues.

## 2020-02-06 MED ORDER — AMANTADINE HCL 50 MG/5ML PO SYRP
200.0000 mg | ORAL_SOLUTION | Freq: Every day | ORAL | Status: DC
  Filled 2020-02-06: qty 20

## 2020-02-06 MED ORDER — SORBITOL 70 % SOLN
30.0000 mL | Freq: Once | Status: AC
  Administered 2020-02-06: 30 mL via ORAL
  Filled 2020-02-06: qty 30

## 2020-02-06 MED ORDER — AMANTADINE HCL 50 MG/5ML PO SYRP
100.0000 mg | ORAL_SOLUTION | Freq: Every day | ORAL | Status: AC
  Administered 2020-02-06 – 2020-02-09 (×4): 100 mg
  Filled 2020-02-06 (×4): qty 10

## 2020-02-06 MED ORDER — SODIUM CHLORIDE 0.9 % IV SOLN: INTRAVENOUS | Status: AC

## 2020-02-06 NOTE — Progress Notes (Signed)
[place] PHYSICAL MEDICINE & REHABILITATION PROGRESS NOTE  Subjective/Complaints:  Pt reports "thirsty"- was able to say word- SLP in room- denies pain by nodding head no-   Per nurse, requiring in/out cath for bladder- poor appetite- no nectar thick D2 diet-  Also got communication from nurse that no BM for 2 days- feels distended by her.    ROS: limited by aphasia  Objective: Vital Signs: Blood pressure 101/81, pulse 82, temperature 98.2 F (36.8 C), resp. rate 20, height 5\' 9"  (1.753 m), weight 79.8 kg, SpO2 96 %. No results found. Recent Labs    02/05/20 0642  WBC 10.7*  HGB 13.8  HCT 42.0  PLT 266   Recent Labs    02/05/20 0642  NA 140  K 4.1  CL 103  CO2 25  GLUCOSE 129*  BUN 36*  CREATININE 1.01  CALCIUM 8.9    Physical Exam: BP 101/81 (BP Location: Left Arm)   Pulse 82   Temp 98.2 F (36.8 C)   Resp 20   Ht 5\' 9"  (1.753 m)   Wt 79.8 kg   SpO2 96%   BMI 25.98 kg/m  Constitutional: No distress . Vital signs reviewed.sitting up in bed; appropriate, aphasic, NAD HENT: Normocephalic.  Atraumatic.  (+) NG in place Eyes: EOMI. No discharge. Cardiovascular: No JVD.  Rate controlled- irregular rhythm Respiratory: CTA B/L- no W/R/R- good air movement. GI: soft, slightly distended- NT; hypoactive BS Skin: Warm and dry.  Intact. Psych: Unable to assess due to aphasia unchanged Musc: No edema in extremities.  No tenderness in extremities. Neuro: Alert Expressive greater than receptive aphasia, able to say 1 word- unchanged Motor: Appears to be 5/5 on left side and 0/5 on right side, unchanged  Assessment/Plan: 1. Functional deficits secondary to left MCA infarct which require 3+ hours per day of interdisciplinary therapy in a comprehensive inpatient rehab setting.  Physiatrist is providing close team supervision and 24 hour management of active medical problems listed below.  Physiatrist and rehab team continue to assess barriers to discharge/monitor  patient progress toward functional and medical goals  Care Tool:  Bathing    Body parts bathed by patient: Right arm, Chest, Abdomen, Front perineal area, Left upper leg, Face, Right upper leg   Body parts bathed by helper: Left arm     Bathing assist Assist Level: Maximal Assistance - Patient 24 - 49%     Upper Body Dressing/Undressing Upper body dressing   What is the patient wearing?: Pull over shirt    Upper body assist Assist Level: Maximal Assistance - Patient 25 - 49%    Lower Body Dressing/Undressing Lower body dressing      What is the patient wearing?: Incontinence brief, Pants     Lower body assist Assist for lower body dressing: Total Assistance - Patient < 25%     Toileting Toileting    Toileting assist Assist for toileting: Maximal Assistance - Patient 25 - 49%     Transfers Chair/bed transfer  Transfers assist  Chair/bed transfer activity did not occur: Safety/medical concerns  Chair/bed transfer assist level: Moderate Assistance - Patient 50 - 74%     Locomotion Ambulation   Ambulation assist      Assist level: 2 helpers Assistive device:  (hallway rail) Max distance: 9   Walk 10 feet activity   Assist  Walk 10 feet activity did not occur: Safety/medical concerns        Walk 50 feet activity   Assist Walk 50 feet  with 2 turns activity did not occur: Safety/medical concerns         Walk 150 feet activity   Assist Walk 150 feet activity did not occur: Safety/medical concerns (R hemi, LE weakness, decreased balance/postural control)         Walk 10 feet on uneven surface  activity   Assist Walk 10 feet on uneven surfaces activity did not occur: Safety/medical concerns (R hemi, LE weakness, decreased balance/postural control)         Wheelchair     Assist Will patient use wheelchair at discharge?: Yes Type of Wheelchair: Manual    Wheelchair assist level: Dependent - Patient 0%      Wheelchair 50  feet with 2 turns activity    Assist        Assist Level: Dependent - Patient 0%   Wheelchair 150 feet activity     Assist     Assist Level: Dependent - Patient 0%      Medical Problem List and Plan: 1.  Impaired mobility and ADLs secondary to left MCA ischemic stroke  Continue CIR  WHO/PRAFO nightly  6/26- will try Amantadine- maybe poor appetite is initiation issues- 100 mg daily- liquid and increase to 200 mg daily in 4 days 2.  Antithrombotics: -DVT/anticoagulation:  Pharmaceutical: Other (comment)--Eliquis             -antiplatelet therapy: N/A 3. Pain Management: Tylenol prn.  4. Mood: LCSW to follow for evaluation and support.              -antipsychotic agents: N/A 5. Neuropsych: This patient is not capable of making decisions on his own behalf. 6. Skin/Wound Care: N/A 7. Fluids/Electrolytes/Nutrition: Monitor I/Os. 8. PAF: Monitor HR tid--has been off Tikosyn since admission. Continue metoprolol bid with Eliquis.   Continue to await further cardiology recs  ECG reviewed, normal sinus rhythm, however in A. fib on exam morning of 6/25 9. HTN: Monitor BP--managed on metoprolol and Avapro. Has been elevated.   6/26- BP 101/75- con't meds  Monitor with increased mobility 10. Dyslipidemia: Continue Crestor. 11.  Post stroke dysphagia: Dysphagia 2, nectar liquids with chin tuck.   Hold tube feeds and start calorie count.   Continue water flushes for now.  6/26- poor appetite per nursing- maybe amantadine might help with initiation to eat  Advance diet as tolerated  12. Impaired fasting glucose: Likely secondary to tube feeds  Elevated on 6/25  Continue to monitor 13. Nonischemic cardiomyopathy: outpatient cardiology follow-up. 14.  Hypoalbuminemia  Supplement initiated on 6/23 15.  Leukocytosis  WBCs 10.7 on 6/25  Afebrile  Continue to monitor 16.  AKI  Creatinine 1.10 on 6/25  Encourage fluids  6/26- BUN 36 and Cr 1.1- will give 1 L NS IVFs- recheck  labs Monday  Continue to monitor 17. Constipation  6/26- LBM 2 days ago- will give Sorbitol for bowels  LOS: 4 days A FACE TO FACE EVALUATION WAS PERFORMED  Elven Laboy 02/06/2020, 3:36 PM

## 2020-02-07 ENCOUNTER — Inpatient Hospital Stay (HOSPITAL_COMMUNITY): Payer: BC Managed Care – PPO

## 2020-02-07 ENCOUNTER — Inpatient Hospital Stay (HOSPITAL_COMMUNITY): Payer: BC Managed Care – PPO | Admitting: Physical Therapy

## 2020-02-07 NOTE — Progress Notes (Signed)
Occupational Therapy Session Note  Patient Details  Name: Dustin Schneider MRN: [id number] Date of Birth: 1956/01/08  Today's Date: 02/07/2020 OT Individual Time: 0900-1000 and 1345-1230 OT Individual Time Calculation (min): 60 min and 45 min   Short Term Goals: Week 1:  OT Short Term Goal 1 (Week 1): Pt will locate all ADL items on R of sink/table with no VC OT Short Term Goal 2 (Week 1): Pt will wash RUE during UB bathing wiht no VC to demo improved R inattention OT Short Term Goal 3 (Week 1): Pt will complete 1/4 steps of UB dressing OT Short Term Goal 4 (Week 1): Pt will sit EOB/EOM statically with no more than MIN A to maintain balance for 3 min  Skilled Therapeutic Interventions/Progress Updates:    Session 1: OT session focused on ADL retraining, functional transfers, and R-NMR. Pt received in bed with friend present. Completed dressing sit<>stand at EOB completing supine>sit with mod A and HOB elevated. Pt demonstrated static sitting balance with CGA up to 20 seconds, however required total A to correct with increased demand for dynamic sitting balance upon donning shirt. OT provided max A for dressing using hemi dressing techniques then completed sit<>stand with max A as helper assisted to manage clothing around waist. Completed squat pivot transfer bed>w/c with max A and second person for safety. Pt ate breakfast with supervision/set up requiring min cues for safe swallowing techniques (small sips, slow pace, and decreased bite size). OT provided PROM exercises to RUE with good tolerance and no pain indicated. Pt left reclined in w/c with friend present and safety belt donned.   Session 2: OT session focused on functional transfers, standing balance, postural control, and R-NMR. Pt received reclined in w/c. Transitioned to therapy gym and completed squat pivot transfer with max A to L side and mod A to R side. While seated EOM, engaged in dynamic reaching task focusing on weightbearing  through RUE. Transitioned to completing task in standing focusing on postural control and R-NMR with lateral weight shifts to retrieve and place objects. Pt required mod-max A for standing balance and sit<>stand. Returned to room and pt left reclined in w/c with all needs in reach. OT donned wrist/hand brace and provide nectar thick liquid to drink while OT present.   Therapy Documentation Precautions:  Precautions Precautions: Fall Precaution Comments: pusher, dense R hemiparesis Restrictions Weight Bearing Restrictions: No General:   Vital Signs: Therapy Vitals Temp: 98.8 F (37.1 C) Temp Source: Oral Pulse Rate: 70 Resp: 18 BP: 123/80 Patient Position (if appropriate): Lying Oxygen Therapy SpO2: 98 % O2 Device: Room Air Pain:   ADL: ADL Eating: Moderate cueing, Supervision/safety Grooming: Minimal assistance Where Assessed-Grooming: Sitting at sink Upper Body Bathing: Minimal assistance Where Assessed-Upper Body Bathing: Bed level Lower Body Bathing: Maximal assistance Where Assessed-Lower Body Bathing: Bed level Upper Body Dressing: Maximal assistance Where Assessed-Upper Body Dressing: Bed level Lower Body Dressing: Maximal assistance Where Assessed-Lower Body Dressing: Bed level Toilet Transfer:  (MOD A STS in stedy) Vision   Perception    Praxis   Exercises:   Other Treatments:     Therapy/Group: Individual Therapy  Duayne Cal 02/07/2020, 9:17 AM

## 2020-02-07 NOTE — Progress Notes (Signed)
[place] PHYSICAL MEDICINE & REHABILITATION PROGRESS NOTE  Subjective/Complaints:  Pt was able to say "I" today, trying to speak, but not more- gets frustrated and cannot go further. Still requiring in/out caths   Per pt, had good BM yesterday.   Brother in law at bedside was asking about a healthcare POA and trying to get one for pt- I explained I think, but I'm not main phsyician, pt would need to be assessed by Neuropsych- Dr Sima Matas, to see if competent to do a POA- if so, sounds great.   Also explained to family member why I ordered IVFs and Amantadine yesterday- to try and get pt to drink more/speak/and have more initiation- and help with BUN of 36- he voiced understanding but was frustrated we didn't call to ask.    ROS: limited by aphasia  Objective: Vital Signs: Blood pressure 123/80, pulse 70, temperature 98.8 F (37.1 C), temperature source Oral, resp. rate 18, height 5\' 9"  (1.753 m), weight 79.8 kg, SpO2 98 %. No results found. Recent Labs    02/05/20 0642  WBC 10.7*  HGB 13.8  HCT 42.0  PLT 266   Recent Labs    02/05/20 0642  NA 140  K 4.1  CL 103  CO2 25  GLUCOSE 129*  BUN 36*  CREATININE 1.01  CALCIUM 8.9    Physical Exam: BP 123/80 (BP Location: Left Arm)    Pulse 70    Temp 98.8 F (37.1 C) (Oral)    Resp 18    Ht 5\' 9"  (1.753 m)    Wt 79.8 kg    SpO2 98%    BMI 25.98 kg/m  Constitutional: sitting up in manual w/c, brother in law in room appropriate, NAD HENT: Normocephalic.  Atraumatic.  NGT still in place Eyes: EOMI. No discharge. Cardiovascular: No JVD.  Rate controlled- irregular rhythm Respiratory: CTA B/L- no W/R/R- good air movement GI: Soft, NT, ND, (+)BS  Skin: Warm and dry.  Intact. Psych: Unable to assess due to aphasia unchanged Musc: No edema in extremities.  No tenderness in extremities. Neuro: Alert- appeared to understand at least part of conversation Expressive greater than receptive aphasia, able to say "I" today Motor:  Appears to be 5/5 on left side and 0/5 on right side, unchanged  Assessment/Plan: 1. Functional deficits secondary to left MCA infarct which require 3+ hours per day of interdisciplinary therapy in a comprehensive inpatient rehab setting.  Physiatrist is providing close team supervision and 24 hour management of active medical problems listed below.  Physiatrist and rehab team continue to assess barriers to discharge/monitor patient progress toward functional and medical goals  Care Tool:  Bathing    Body parts bathed by patient: Right arm, Chest, Abdomen, Front perineal area, Left upper leg, Face, Right upper leg   Body parts bathed by helper: Left arm     Bathing assist Assist Level: Maximal Assistance - Patient 24 - 49%     Upper Body Dressing/Undressing Upper body dressing   What is the patient wearing?: Pull over shirt    Upper body assist Assist Level: Maximal Assistance - Patient 25 - 49%    Lower Body Dressing/Undressing Lower body dressing      What is the patient wearing?: Pants     Lower body assist Assist for lower body dressing: 2 Helpers     Toileting Toileting    Toileting assist Assist for toileting: Maximal Assistance - Patient 25 - 49%     Transfers Chair/bed transfer  Transfers assist  Chair/bed transfer activity did not occur: Safety/medical concerns  Chair/bed transfer assist level: Maximal Assistance - Patient 25 - 49%     Locomotion Ambulation   Ambulation assist      Assist level: 2 helpers Assistive device:  (hallway rail) Max distance: 9   Walk 10 feet activity   Assist  Walk 10 feet activity did not occur: Safety/medical concerns        Walk 50 feet activity   Assist Walk 50 feet with 2 turns activity did not occur: Safety/medical concerns         Walk 150 feet activity   Assist Walk 150 feet activity did not occur: Safety/medical concerns (R hemi, LE weakness, decreased balance/postural control)          Walk 10 feet on uneven surface  activity   Assist Walk 10 feet on uneven surfaces activity did not occur: Safety/medical concerns (R hemi, LE weakness, decreased balance/postural control)         Wheelchair     Assist Will patient use wheelchair at discharge?: Yes Type of Wheelchair: Manual    Wheelchair assist level: Dependent - Patient 0%      Wheelchair 50 feet with 2 turns activity    Assist        Assist Level: Dependent - Patient 0%   Wheelchair 150 feet activity     Assist     Assist Level: Dependent - Patient 0%      Medical Problem List and Plan: 1.  Impaired mobility and ADLs secondary to left MCA ischemic stroke  Continue CIR  WHO/PRAFO nightly  6/26- will try Amantadine- maybe poor appetite is initiation issues- 100 mg daily- liquid and increase to 200 mg daily in 4 days 2.  Antithrombotics: -DVT/anticoagulation:  Pharmaceutical: Other (comment)--Eliquis             -antiplatelet therapy: N/A 3. Pain Management: Tylenol prn.  4. Mood: LCSW to follow for evaluation and support.              -antipsychotic agents: N/A 5. Neuropsych: This patient is not capable of making decisions on his own behalf. 6. Skin/Wound Care: N/A 7. Fluids/Electrolytes/Nutrition: Monitor I/Os. 8. PAF: Monitor HR tid--has been off Tikosyn since admission. Continue metoprolol bid with Eliquis.   Continue to await further cardiology recs  ECG reviewed, normal sinus rhythm, however in A. fib on exam morning of 6/25  6/27- in Afib- rate controlled this AM 9. HTN: Monitor BP--managed on metoprolol and Avapro. Has been elevated.   6/26- BP 101/75- con't meds  6/27- BP 120s/80s- con't regimen  Monitor with increased mobility 10. Dyslipidemia: Continue Crestor. 11.  Post stroke dysphagia: Dysphagia 2, nectar liquids with chin tuck.   Hold tube feeds and start calorie count.   Continue water flushes for now.  6/26- poor appetite per nursing- maybe amantadine might  help with initiation to eat  Advance diet as tolerated  12. Impaired fasting glucose: Likely secondary to tube feeds  Elevated on 6/25  Continue to monitor 13. Nonischemic cardiomyopathy: outpatient cardiology follow-up. 14.  Hypoalbuminemia  Supplement initiated on 6/23 15.  Leukocytosis  WBCs 10.7 on 6/25  Afebrile  Continue to monitor 16.  AKI  Creatinine 1.10 on 6/25  Encourage fluids  6/26- BUN 36 and Cr 1.1- will give 1 L NS IVFs- recheck labs Monday  6/27- explained to brother in law- I think we need to push fluids- asked pt to demonstrate he could push call  bell so cold access nursing/drink. He did so.   Continue to monitor 17. Constipation  6/26- LBM 2 days ago- will give Sorbitol for bowels  6/27- had good BM 18. Aphasia  6/27- started amantadine- using communication board- brother in law- who says he's a physician, wants to get [facility] POA for pt- explained will likely need Dr Marcheta Grammes to determine competence before does that.    I spent a total of 35 minutes on total care- 10 minutes doing note- 15 minutes with brother in law, and 10 minutes with pt. Also spoke to nurse at length about pt and concerns.    LOS: 5 days A FACE TO FACE EVALUATION WAS PERFORMED  Dustin Schneider 02/07/2020, 2:19 PM

## 2020-02-07 NOTE — Progress Notes (Signed)
Physical Therapy Session Note  Patient Details  Name: Dustin Schneider MRN: [id number] Date of Birth: 1956/01/24  Today's Date: 02/07/2020 PT Individual Time: 1000-1115 PT Individual Time Calculation (min): 75 min   Short Term Goals: Week 1:  PT Short Term Goal 1 (Week 1): Pt will transfer supine<>sitting EOB with mod A PT Short Term Goal 2 (Week 1): Pt will transfer bed<>chair with LRAD mod A PT Short Term Goal 3 (Week 1): Pt will perform simulated car transfer with LRAD max A  Skilled Therapeutic Interventions/Progress Updates: Pt presented in TIS with BIL present agreeable to therapy. Pt denies pain during session. [name] BIL requesting if headrest could be adjusted as noted to be high and far back with pillow placed to allow for head to rest. PTA donned socks/shoes total A and transported pt to rehab gym. PTA adjusted head rest to more level height. Pt then transported to ortho gym and participated in use of standing frame for forced use of RLE. Upon standing PTA asked pt if felt dizzy with pt nodding head yes. After approx 30 sec PTA asked again with pt again nodding head yes. Pt returned to sitting and BP checked 99/68 (79) HR 82. After approx 3 min pt nodded head no when PTA asked pt if dizzy. Pt attempted standing again with pt nodding head no when asked if dizzy and BP 94/72 with pt nodding head no when asked if dizzy. Pt then participated in reaching with LUE placing horseshoes on mirror for increased wt bearing on RLE. Pt able to tolerate standing in frame and reaching to R then to L with LUE for approx 5 min before visible fatigue noted. Pt returned to sitting and then transported to rehab gym for sitting balance activities. Pt performed lateral scoot transfer to mat on L with minA and no pushing noted. Pt then participated in several bouts of horseshoes reaching with moderate challenges high/low/crossing midline. Pt with x 1 LOB with challenging lean to R with LUE with pt unable to recover  and requiring modA for correction. Pt was also able to maintain midline with all sitting balance and maintained static sitting with CGA. Pt then participated in 3 min of ball taps with beach ball for additional dynamic balance challenge. Pt was able to perform reaching with moderate challenges with supervision and no LOB. Pt then performed squat pivot transfer to R with modA back to TIS. Pt then transported to day room and participated in Cybex Kinetron 80cm/sec 3 bouts of 30 cycles for reciprocal activity with PTA initially assisting with RLE then pt able to provide AAROM on own. Pt then transported back to room at end of session and remained in TIS. Pt left with belt alarm on, call bell within reach, and BIL present.      Therapy Documentation Precautions:  Precautions Precautions: Fall Precaution Comments: pusher, dense R hemiparesis Restrictions Weight Bearing Restrictions: No General:   Vital Signs: Therapy Vitals Temp: 98.7 F (37.1 C) Temp Source: Oral Pulse Rate: 62 Resp: 18 BP: 120/71 Patient Position (if appropriate): Sitting Oxygen Therapy SpO2: 96 % O2 Device: Room Air Pain:   Mobility:   Locomotion :    Trunk/Postural Assessment :    Balance:   Exercises:   Other Treatments:      Therapy/Group: Individual Therapy  Albert Devaul 02/07/2020, 4:51 PM

## 2020-02-08 ENCOUNTER — Inpatient Hospital Stay (HOSPITAL_COMMUNITY): Payer: BC Managed Care – PPO | Admitting: Speech Pathology

## 2020-02-08 ENCOUNTER — Inpatient Hospital Stay (HOSPITAL_COMMUNITY): Payer: BC Managed Care – PPO | Admitting: Occupational Therapy

## 2020-02-08 ENCOUNTER — Inpatient Hospital Stay (HOSPITAL_COMMUNITY): Payer: BC Managed Care – PPO

## 2020-02-08 LAB — BASIC METABOLIC PANEL
Anion gap: 10 (ref 5–15)
BUN: 23 mg/dL (ref 8–23)
CO2: 24 mmol/L (ref 22–32)
Calcium: 8.8 mg/dL — ABNORMAL LOW (ref 8.9–10.3)
Chloride: 103 mmol/L (ref 98–111)
Creatinine, Ser: 0.89 mg/dL (ref 0.61–1.24)
GFR calc Af Amer: >60 mL/min (ref >60)
GFR calc non Af Amer: >60 mL/min (ref >60)
Glucose, Bld: 124 mg/dL — ABNORMAL HIGH (ref 70–99)
Potassium: 4.4 mmol/L (ref 3.5–5.1)
Sodium: 137 mmol/L (ref 135–145)

## 2020-02-08 LAB — CBC
HCT: 44.6 % (ref 39.0–52.0)
Hemoglobin: 14.6 g/dL (ref 13.0–17.0)
MCH: 31.3 pg (ref 26.0–34.0)
MCHC: 32.7 g/dL (ref 30.0–36.0)
MCV: 95.5 fL (ref 80.0–100.0)
Platelets: 333 10*3/uL (ref 150–400)
RBC: 4.67 MIL/uL (ref 4.22–5.81)
RDW: 13.2 % (ref 11.5–15.5)
WBC: 12.9 10*3/uL — ABNORMAL HIGH (ref 4.0–10.5)
nRBC: 0 % (ref 0.0–0.2)

## 2020-02-08 NOTE — Plan of Care (Signed)
  Problem: Consults Goal: RH STROKE PATIENT EDUCATION Description: See Patient Education module for education specifics  Outcome: Progressing   Problem: RH BOWEL ELIMINATION Goal: RH STG MANAGE BOWEL WITH ASSISTANCE Description: STG Manage Bowel with min Assistance. Outcome: Progressing Goal: RH STG MANAGE BOWEL W/MEDICATION W/ASSISTANCE Description: STG Manage Bowel with Medication mod I with Assistance. Outcome: Progressing

## 2020-02-08 NOTE — Progress Notes (Signed)
Physical Therapy Session Note  Patient Details  Name: Dustin Schneider MRN: [id number] Date of Birth: [birth year]  Today's Date: 02/08/2020 PT Individual Time: 1000-1053 and 1501-1530  PT Individual Time Calculation (min): 53 min and 29 min  Short Term Goals: Week 1:  PT Short Term Goal 1 (Week 1): Pt will transfer supine<>sitting EOB with mod A PT Short Term Goal 2 (Week 1): Pt will transfer bed<>chair with LRAD mod A PT Short Term Goal 3 (Week 1): Pt will perform simulated car transfer with LRAD max A  Skilled Therapeutic Interventions/Progress Updates:   Treatment Session 1: 1000-1053 53 min Received pt sitting in TIS WC, pt agreeable to therapy, and denied any pain during session. Pt with continued aphasia. Session with emphasis on functional mobility/transfers, generalized strengthening, dynamic sitting/standing balance/coordination, gait training, NMR, motor control/planning, midline orientation, and improved activity tolerance. Donned R AFO and shoes max/total A for time management. Pt transported to ortho gym in [facility] total A and performed simulated car transfer without AD mod A. Pt required manual facilitation to control R knee with transfer and total A to manage R LE in/out of car. Pt required cues for technique and safe entry into/out of car. Pt transported to therapy gym and ambulated 39ft x 1 and 23ft x1 with L handrail and max A +2 for close WC follow with L UE supported around therapist using mirror for visual feedback. Pt required max verbal and tactile cues for upright posture, hip/knee extension, weight shifting to L, and manual facilitation to advance R LE. Pt fatigued after activity and required multiple rest breaks throughout session. Pt transferred TIS WC<>mat via squat<>pivot to L mod A with manual facilitation to block R knee. Pt transferred sit<>stand mod A +2 with 3 musketeer support and worked on Automotive engineer hooking/unhooking horseshoes to/from top L side of mirror  x 2 trials with emphasis on L weight shifting, midline orientation, and R knee/hip extension using mirror for visual feedback. Pt with increased pushing to R when standing and required mod/max cues to weight shift to L. Pt transferred mat<>TIS WC mod A to R with cues for technique and transported back to room in [facility] total A. Concluded session with pt semi-reclined in TIS WC, needs within reach, and seatbelt alarm on.   Treatment Session 2: 0539-7673 29 min Received pt sitting in TIS WC, pt agreeable to therapy, and denied any pain during session. [name] girlfriend present at bedside. Session with emphasis on functional mobility/transfers, generalized strengthening, dynamic sitting/standing balance/coordination, NMR, motor control/planning, midline orientation, and improved activity tolerance. Pt transported to therapy gym in TIS WC total A. Pt transferred squat<>pivot mod A x 2 trials throughout session with cues for technique. Pt transferred sit<>stand with mod A +2 for 3 musketeer assist and performed mini squats 3x6 with verbal, tactile, and visual cues for upright posture, weight shifting to L, and R knee control using mirror for visual feedback. Worked on R hamstring active assist towel slides 2x6 reps. Pt with minor R hamstring activation but with increased difficulty with R knee extension. Pt transferred sit<>stand mod A +2 for 3 musketeer assist and worked on pre-gait stepping with L LE with therapist providing manual facilitation to control R knee 2x5 reps. Pt transported back to room in TIS WC total A. Concluded session with pt semi-reclined in TIS WC, needs within reach, and seatbelt alarm on.   Therapy Documentation Precautions:  Precautions Precautions: Fall Precaution Comments: pusher, dense R hemiparesis Restrictions Weight Bearing Restrictions: No  Therapy/Group: Individual Therapy Alfonse Alpers PT, DPT   02/08/2020, 7:29 AM

## 2020-02-08 NOTE — Progress Notes (Signed)
Speech Language Pathology Daily Session Note  Patient Details  Name: Dustin Schneider MRN: [id number] Date of Birth: 04/17/1956  Today's Date: 02/08/2020 SLP Individual Time: 1400-1500 SLP Individual Time Calculation (min): 60 min  Short Term Goals: Week 1: SLP Short Term Goal 1 (Week 1): Pt will consume current diet with minimal overt s/sx aspiration, efficient mastication and oral clearance with Supervision A verbal cues for use of strategies. SLP Short Term Goal 2 (Week 1): Pt will consume trials of thin liquids with minimal overt s/sx aspiration over 3 sessions prior to advancement. SLP Short Term Goal 3 (Week 1): Pt will consume trials of dysphagia 3 (mech soft) solids demonstrating efficient mastication and oral clearance X2 prior to advancement. SLP Short Term Goal 4 (Week 1): Pt will use multimodal means of communication to express basic wants and needs with Max A multimodal cues. SLP Short Term Goal 5 (Week 1): Pt will participate in automatic speech tasks with 80% accuracy provided Max A multimodal cues. SLP Short Term Goal 6 (Week 1): Pt will verbalize at the word level with Max A multimodal cues.  Skilled Therapeutic Interventions: Skilled treatment session focused on communication goals. SLP facilitated session by providing Mod A visual, verbal and articulatory cues for patient to produce basic CV and CVC combinations. Due to increased ability to verbally produce words, SLP generated 3 words that are significant to the patient to maximize verbal output. Patient able to to verbalize "professor, "sailing" and "artist" with visual and phonemic cues in ~75% of opportunities. Patient handed off to PT. Continue with current plan of care.       Pain No/Denies Pain   Therapy/Group: Individual Therapy  Dustin Schneider 02/08/2020, 3:22 PM

## 2020-02-08 NOTE — Progress Notes (Signed)
Patient ID: Dustin Schneider, male   DOB: 1956/07/26, 64 y.o.   MRN: [id number]   Patient added to neuropysch for consult placed to see if patient is competent for HCPOA forms. Santiago Glad has been his nurse this weekend and has had some troubles with family.

## 2020-02-08 NOTE — Progress Notes (Signed)
Occupational Therapy Session Note  Patient Details  Name: Dustin Schneider MRN: [id number] Date of Birth: 03/06/1956  Today's Date: 02/08/2020 OT Individual Time: 2620-3559 OT Individual Time Calculation (min): 61 min    Short Term Goals: Week 1:  OT Short Term Goal 1 (Week 1): Pt will locate all ADL items on R of sink/table with no VC OT Short Term Goal 2 (Week 1): Pt will wash RUE during UB bathing wiht no VC to demo improved R inattention OT Short Term Goal 3 (Week 1): Pt will complete 1/4 steps of UB dressing OT Short Term Goal 4 (Week 1): Pt will sit EOB/EOM statically with no more than MIN A to maintain balance for 3 min  Skilled Therapeutic Interventions/Progress Updates:    Treatment session with focus on ADL retraining, trunk control and balance reactions to improve sitting balance for bathing and dressing, and NMR to facilitate WB to RUE to increase muscle activation to promote improved independence in ADL tasks. Pt received semi-reclined in bed reporting no pain and agreeable to bathing. Completed bathing of BUE and BLE sitting at EOB, excluding perineal care, with modA to wash LUE and minA increasing to modA with fatigue to maintain dynamic sitting balance. Pt able to maintain dynamic sitting balance with minA for longer than previous session. Provided tactile cues at trunk to facilitate trunk control and midline orientation. Donned shirt with modA and educated on hemi dressing technique with pt demonstrating understanding. Donned pants at sit to stand level with 2 helpers and modA to facilitate standing balance and to don pants. Completed squat pivot transfers to w/c, mat, and w/c with min-modA for power up and modA to maintain forward weight shift for transfer. Facilitated multimodal cues to attend to RUE and RLE during functional transfers. Sitting at [name], facilitated weight shifting on to elbow of RUE and provided multimodal cues at trunk to complete functional reach across midline  and laterally with LUE. Activity targeting NMR for RUE and dynamic sitting balance to improve ADL task independence. Ended session with pt seated in TIS with RUE supported, all needs within reach, and call bell within reach.  Therapy Documentation Precautions:  Precautions Precautions: Fall Precaution Comments: pusher, dense R hemiparesis Restrictions Weight Bearing Restrictions: No   Therapy/Group: Individual Therapy  Michelle Nasuti 02/08/2020, 12:20 PM

## 2020-02-08 NOTE — Progress Notes (Signed)
[facility] PHYSICAL MEDICINE & REHABILITATION PROGRESS NOTE  Subjective/Complaints: Patient seen sitting up in bed this morning.  He indicates he slept well overnight.  He appears to have questions regarding eating/NG tube.  ROS: limited by aphasia  Objective: Vital Signs: Blood pressure (!) 138/93, pulse 81, temperature 98.2 F (36.8 C), resp. rate 16, height 5\' 9"  (1.753 m), weight 79.8 kg, SpO2 97 %. No results found. Recent Labs    02/08/20 0740  WBC 12.9*  HGB 14.6  HCT 44.6  PLT 333   Recent Labs    02/08/20 0740  NA 137  K 4.4  CL 103  CO2 24  GLUCOSE 124*  BUN 23  CREATININE 0.89  CALCIUM 8.8*    Physical Exam: BP (!) 138/93 (BP Location: Left Arm)   Pulse 81   Temp 98.2 F (36.8 C)   Resp 16   Ht 5\' 9"  (1.753 m)   Wt 79.8 kg   SpO2 97%   BMI 25.98 kg/m  Constitutional: No distress . Vital signs reviewed. HENT: Normocephalic.  Atraumatic.  + NGT. Eyes: EOMI. No discharge. Cardiovascular: No JVD.  Irregular rhythm. Respiratory: Normal effort.  No stridor. GI: Non-distended. Skin: Warm and dry.  Intact. Psych: Appears flat.   Musc: No edema in extremities.  No tenderness in extremities. Neuro: Alert Expressive >receptive aphasia, Improving Motor: Appears to be 5/5 on left side and 0/5 on right side, stable  Assessment/Plan: 1. Functional deficits secondary to left MCA infarct which require 3+ hours per day of interdisciplinary therapy in a comprehensive inpatient rehab setting.  Physiatrist is providing close team supervision and 24 hour management of active medical problems listed below.  Physiatrist and rehab team continue to assess barriers to discharge/monitor patient progress toward functional and medical goals  Care Tool:  Bathing    Body parts bathed by patient: Right arm, Chest, Abdomen, Front perineal area, Left upper leg, Face, Right upper leg   Body parts bathed by helper: Left arm     Bathing assist Assist Level: Maximal  Assistance - Patient 24 - 49%     Upper Body Dressing/Undressing Upper body dressing   What is the patient wearing?: Pull over shirt    Upper body assist Assist Level: Maximal Assistance - Patient 25 - 49%    Lower Body Dressing/Undressing Lower body dressing      What is the patient wearing?: Pants     Lower body assist Assist for lower body dressing: 2 Helpers     Toileting Toileting    Toileting assist Assist for toileting: Maximal Assistance - Patient 25 - 49%     Transfers Chair/bed transfer  Transfers assist  Chair/bed transfer activity did not occur: Safety/medical concerns  Chair/bed transfer assist level: Maximal Assistance - Patient 25 - 49%     Locomotion Ambulation   Ambulation assist      Assist level: 2 helpers Assistive device:  (hallway rail) Max distance: 9   Walk 10 feet activity   Assist  Walk 10 feet activity did not occur: Safety/medical concerns        Walk 50 feet activity   Assist Walk 50 feet with 2 turns activity did not occur: Safety/medical concerns         Walk 150 feet activity   Assist Walk 150 feet activity did not occur: Safety/medical concerns (R hemi, LE weakness, decreased balance/postural control)         Walk 10 feet on uneven surface  activity   Assist  Walk 10 feet on uneven surfaces activity did not occur: Safety/medical concerns (R hemi, LE weakness, decreased balance/postural control)         Wheelchair     Assist Will patient use wheelchair at discharge?: Yes Type of Wheelchair: Manual    Wheelchair assist level: Dependent - Patient 0%      Wheelchair 50 feet with 2 turns activity    Assist        Assist Level: Dependent - Patient 0%   Wheelchair 150 feet activity     Assist     Assist Level: Dependent - Patient 0%      Medical Problem List and Plan: 1.  Impaired mobility and ADLs secondary to left MCA ischemic stroke  Continue CIR  WHO/PRAFO  nightly 2.  Antithrombotics: -DVT/anticoagulation:  Pharmaceutical: Other (comment)--Eliquis             -antiplatelet therapy: N/A 3. Pain Management: Tylenol prn.  4. Mood: LCSW to follow for evaluation and support.              -antipsychotic agents: N/A 5. Neuropsych: This patient is not capable of making decisions on his own behalf.  Trial of amantadine started on 6/26, will consider d/cing 6. Skin/Wound Care: N/A 7. Fluids/Electrolytes/Nutrition: Monitor I/Os. 8. PAF: Monitor HR tid--has been off Tikosyn since admission. Continue metoprolol bid with Eliquis.   Per cardiology, continue to monitor-no need for Tikosyn at present 9. HTN: Monitor BP--managed on metoprolol and Avapro. Has been elevated.   Controlled on 6/28  Monitor with increased mobility 10. Dyslipidemia: Continue Crestor. 11.  Post stroke dysphagia: Dysphagia 2, nectar liquids with chin tuck.   Hold tube feeds and start calorie count.   Eating well overall, will DC NG  Advance diet as tolerated  12. Impaired fasting glucose: Likely secondary to tube feeds  Elevated on 6/25  Continue to monitor 13. Nonischemic cardiomyopathy: outpatient cardiology follow-up. 14.  Hypoalbuminemia  Supplement initiated on 6/23 15.  Leukocytosis  WBCs 12.9 on 6/28, labs ordered for tomorrow  Afebrile  Continue to monitor 16.  AKI:   Creatinine 0.89 on 6/28 after fluid bolus  Encourage fluids  Continue to monitor 17. Constipation  Improving   LOS: 6 days A FACE TO FACE EVALUATION WAS PERFORMED  Dustin Schneider [name] 02/08/2020, 9:08 AM

## 2020-02-09 ENCOUNTER — Inpatient Hospital Stay (HOSPITAL_COMMUNITY): Payer: BC Managed Care – PPO | Admitting: Physical Therapy

## 2020-02-09 ENCOUNTER — Inpatient Hospital Stay (HOSPITAL_COMMUNITY): Payer: BC Managed Care – PPO | Admitting: Speech Pathology

## 2020-02-09 ENCOUNTER — Inpatient Hospital Stay (HOSPITAL_COMMUNITY): Payer: BC Managed Care – PPO | Admitting: Occupational Therapy

## 2020-02-09 ENCOUNTER — Ambulatory Visit (HOSPITAL_COMMUNITY): Payer: BC Managed Care – PPO

## 2020-02-09 LAB — CBC WITH DIFFERENTIAL/PLATELET
Abs Immature Granulocytes: 0.09 10*3/uL — ABNORMAL HIGH (ref 0.00–0.07)
Basophils Absolute: 0.1 10*3/uL (ref 0.0–0.1)
Basophils Relative: 1 %
Eosinophils Absolute: 0.2 10*3/uL (ref 0.0–0.5)
Eosinophils Relative: 2 %
HCT: 41.2 % (ref 39.0–52.0)
Hemoglobin: 13.4 g/dL (ref 13.0–17.0)
Immature Granulocytes: 1 %
Lymphocytes Relative: 19 %
Lymphs Abs: 2 10*3/uL (ref 0.7–4.0)
MCH: 31.2 pg (ref 26.0–34.0)
MCHC: 32.5 g/dL (ref 30.0–36.0)
MCV: 96 fL (ref 80.0–100.0)
Monocytes Absolute: 0.8 10*3/uL (ref 0.1–1.0)
Monocytes Relative: 7 %
Neutro Abs: 7.4 10*3/uL (ref 1.7–7.7)
Neutrophils Relative %: 70 %
Platelets: 343 10*3/uL (ref 150–400)
RBC: 4.29 MIL/uL (ref 4.22–5.81)
RDW: 13.5 % (ref 11.5–15.5)
WBC: 10.5 10*3/uL (ref 4.0–10.5)
nRBC: 0 % (ref 0.0–0.2)

## 2020-02-09 MED ORDER — AMANTADINE HCL 100 MG PO CAPS
200.0000 mg | ORAL_CAPSULE | Freq: Every day | ORAL | Status: DC
  Administered 2020-02-10 – 2020-02-11 (×2): 200 mg via ORAL
  Filled 2020-02-09 (×2): qty 2

## 2020-02-09 NOTE — Progress Notes (Signed)
Physical Therapy Session Note  Patient Details  Name: Dustin Schneider MRN: [id number] Date of Birth: 1955-08-27  Today's Date: 02/09/2020 PT Individual Time: 0935-1000 PT Individual Time Calculation (min): 25 min   Short Term Goals: Week 1:  PT Short Term Goal 1 (Week 1): Pt will transfer supine<>sitting EOB with mod A PT Short Term Goal 2 (Week 1): Pt will transfer bed<>chair with LRAD mod A PT Short Term Goal 3 (Week 1): Pt will perform simulated car transfer with LRAD max A  Skilled Therapeutic Interventions/Progress Updates:    Pt received seated in TIS w/c in room, agreeable to PT session. Pt indicates "no" when asked if he is experiencing any pain. Dependent transport via TIS w/c to/from therapy gym. Session focus on car transfers in simulation car. Pt is mod A for squat pivot transfer w/c to/from car x 2 repetitions with R knee blocked, cues for hand placement and weight shift during transfer. Pt also requires assist for RLE management in/out of car. Pt left semi-reclined in TIS w/c in room with needs in reach, quick release belt and chair alarm in place at end of session.  Therapy Documentation Precautions:  Precautions Precautions: Fall Precaution Comments: pusher, dense R hemiparesis Restrictions Weight Bearing Restrictions: No    Therapy/Group: Individual Therapy   Excell Seltzer, PT, DPT  02/09/2020, 11:57 AM

## 2020-02-09 NOTE — Progress Notes (Signed)
[place] PHYSICAL MEDICINE & REHABILITATION PROGRESS NOTE  Subjective/Complaints: Patient seen sitting up in bed this morning, working with therapies.  He indicates he slept well overnight.  He indicates that he feels better without his NG tube.  Encourage patient to attempt to vocalize and not get frustrated.  He did not wear his orthoses overnight, discussed with patient as well as nursing.  ROS: limited by aphasia, but appears to deny CP, shortness of breath, nausea, vomiting and diarrhea.  Objective: Vital Signs: Blood pressure 101/85, pulse 89, temperature 98.5 F (36.9 C), resp. rate 18, height 5\' 9"  (1.753 m), weight 79.8 kg, SpO2 97 %. No results found. Recent Labs    02/08/20 0740 02/09/20 0619  WBC 12.9* 10.5  HGB 14.6 13.4  HCT 44.6 41.2  PLT 333 343   Recent Labs    02/08/20 0740  NA 137  K 4.4  CL 103  CO2 24  GLUCOSE 124*  BUN 23  CREATININE 0.89  CALCIUM 8.8*    Physical Exam: BP 101/85 (BP Location: Left Arm)   Pulse 89   Temp 98.5 F (36.9 C)   Resp 18   Ht 5\' 9"  (1.753 m)   Wt 79.8 kg   SpO2 97%   BMI 25.98 kg/m  Constitutional: No distress . Vital signs reviewed. HENT: Normocephalic.  Atraumatic.  + NG. Eyes: EOMI. No discharge. Cardiovascular: No JVD.  Regular rhythm. Respiratory: Normal effort.  No stridor. GI: Non-distended. Skin: Warm and dry.  Intact. Psych: Flat. Musc: No edema in extremities.  No tenderness in extremities. Neuro: Alert Expressive >receptive aphasia, improving Motor: Appears to be 5/5 on left side and 0/5 on right side, unchanged No increase in tone appreciated  Assessment/Plan: 1. Functional deficits secondary to left MCA infarct which require 3+ hours per day of interdisciplinary therapy in a comprehensive inpatient rehab setting.  Physiatrist is providing close team supervision and 24 hour management of active medical problems listed below.  Physiatrist and rehab team continue to assess barriers to  discharge/monitor patient progress toward functional and medical goals  Care Tool:  Bathing    Body parts bathed by patient: Right arm, Chest, Abdomen, Front perineal area, Left upper leg, Face, Right upper leg   Body parts bathed by helper: Left arm     Bathing assist Assist Level: Moderate Assistance - Patient 50 - 74%     Upper Body Dressing/Undressing Upper body dressing   What is the patient wearing?: Pull over shirt    Upper body assist Assist Level: Moderate Assistance - Patient 50 - 74%    Lower Body Dressing/Undressing Lower body dressing      What is the patient wearing?: Pants     Lower body assist Assist for lower body dressing: Maximal Assistance - Patient 25 - 49%     Toileting Toileting    Toileting assist Assist for toileting: Maximal Assistance - Patient 25 - 49%     Transfers Chair/bed transfer  Transfers assist  Chair/bed transfer activity did not occur: Safety/medical concerns  Chair/bed transfer assist level: Moderate Assistance - Patient 50 - 74%     Locomotion Ambulation   Ambulation assist      Assist level: 2 helpers Assistive device: Other (comment) (L handrail) Max distance: 100ft   Walk 10 feet activity   Assist  Walk 10 feet activity did not occur: Safety/medical concerns  Assist level: 2 helpers Assistive device: Other (comment) (L handrail)   Walk 50 feet activity   Assist Walk  50 feet with 2 turns activity did not occur: Safety/medical concerns         Walk 150 feet activity   Assist Walk 150 feet activity did not occur: Safety/medical concerns (R hemi, LE weakness, decreased balance/postural control)         Walk 10 feet on uneven surface  activity   Assist Walk 10 feet on uneven surfaces activity did not occur: Safety/medical concerns (R hemi, LE weakness, decreased balance/postural control)         Wheelchair     Assist Will patient use wheelchair at discharge?: Yes Type of  Wheelchair: Manual    Wheelchair assist level: Dependent - Patient 0%      Wheelchair 50 feet with 2 turns activity    Assist        Assist Level: Dependent - Patient 0%   Wheelchair 150 feet activity     Assist     Assist Level: Dependent - Patient 0%      Medical Problem List and Plan: 1.  Impaired mobility and ADLs secondary to left MCA ischemic stroke  Continue CIR  WHO/PRAFO nightly, discussed with nursing 2.  Antithrombotics: -DVT/anticoagulation:  Pharmaceutical: Other (comment)--Eliquis             -antiplatelet therapy: N/A 3. Pain Management: Tylenol prn.  4. Mood: LCSW to follow for evaluation and support.              -antipsychotic agents: N/A 5. Neuropsych: This patient is not capable of making decisions on his own behalf.  Trial of amantadine started on 6/26, will consider d/cing after discussion with therapies 6. Skin/Wound Care: N/A 7. Fluids/Electrolytes/Nutrition: Monitor I/Os. 8. PAF: Monitor HR tid--has been off Tikosyn since admission. Continue metoprolol bid with Eliquis.   Per cardiology, continue to monitor-no need for Tikosyn at present  Rate controlled on 6/29 9. HTN: Monitor BP--managed on metoprolol and Avapro. Has been elevated.   Controlled on 6/29  Monitor with increased mobility 10. Dyslipidemia: Continue Crestor. 11.  Post stroke dysphagia: Dysphagia 2, nectar liquids with chin tuck.   Hold tube feeds and start calorie count.   NG DC'd  Advance diet as tolerated  12. Impaired fasting glucose: Likely secondary to tube feeds  Elevated on 6/25  Continue to monitor 13. Nonischemic cardiomyopathy: outpatient cardiology follow-up. 14.  Hypoalbuminemia  Supplement initiated on 6/23 15.  Leukocytosis: Resolved  WBCs 10.5 on 6/29  Afebrile  Continue to monitor 16.  AKI:   Creatinine 0.89 on 6/28 after fluid bolus  Encourage fluids  Continue to monitor 17. Constipation  Improving  LOS: 7 days A FACE TO FACE EVALUATION  WAS PERFORMED  Bevely Hackbart [name] 02/09/2020, 9:24 AM

## 2020-02-09 NOTE — Progress Notes (Signed)
Occupational Therapy Session Note  Patient Details  Name: Dustin Schneider MRN: [id number] Date of Birth: [birth year]  Today's Date: 02/09/2020 OT Individual Time: 6967-8938 OT Individual Time Calculation (min): 61 min    Short Term Goals: Week 1:  OT Short Term Goal 1 (Week 1): Pt will locate all ADL items on R of sink/table with no VC OT Short Term Goal 2 (Week 1): Pt will wash RUE during UB bathing wiht no VC to demo improved R inattention OT Short Term Goal 3 (Week 1): Pt will complete 1/4 steps of UB dressing OT Short Term Goal 4 (Week 1): Pt will sit EOB/EOM statically with no more than MIN A to maintain balance for 3 min  Skilled Therapeutic Interventions/Progress Updates:    Treatment session with focus on ADL retraining, functional transfers, and NMR. Pt received semi-reclined in bed declining bathing and requesting dressing. Pt performed supine to sit with support of BLE. Set up dressing items in R visual field to increase R attention, pt able to attend without verbal cues. Educated pt on [place] for hemi dressing techniques to don shirt and pants. Pt doffed shirt sitting at EOB with minA for sitting balance and donned shirt with modA. Pt donned pants with modA with assistance to support BLE in figure 4 position and to support standing balance to advance pants on to waist. Provided mod-maxA for dynamic standing balance to maintain midline orientation due to R lean and to block knee with pt assisting with donning pants. Facilitated multimodal cues for sequencing to RUE and RLE first. Pt with weakness of BLE preventing pt from sustaining figure 4 position and with insufficient functional reach to thread pants without AE. Pt doffed and donned socks with a one handed technique with minA to support BLE in figure 4. Pt would benefit from continued intervention to target functional reach to BLE. Completed squat pivot transfers to w/c, mat, and w/c with modA while blocking knee and with  multimodal cues for hand and leg placement and to facilitate anterior weight shift. Engaged in [place] activity targeting WB of RUE at elbow and wrist and dynamic sitting balance and postural control. Engaged in weight shifting through abduction and internal rotation of LUE, facilitating at the elbow and hand to facilitate WB of RUE. Completed session with pt semi-reclined in TIS with all needs within reach and belt alarm on.   Therapy Documentation Precautions:  Precautions Precautions: Fall Precaution Comments: pusher, dense R hemiparesis Restrictions Weight Bearing Restrictions: No Vital Signs: Therapy Vitals Temp: 98.5 F (36.9 C) Pulse Rate: 70 Resp: 18 BP: 104/86 Patient Position (if appropriate): Lying Oxygen Therapy SpO2: 97 % O2 Device: Room Air Pain:    Therapy/Group: Individual Therapy  Michelle Nasuti 02/09/2020, 8:41 AM

## 2020-02-09 NOTE — Progress Notes (Signed)
Physical Therapy Session Note  Patient Details  Name: Dustin Schneider MRN: [id number] Date of Birth: 1956/07/18  Today's Date: 02/09/2020 PT Individual Time: 1100-1157 PT Individual Time Calculation (min): 57 min   Short Term Goals: Week 1:  PT Short Term Goal 1 (Week 1): Pt will transfer supine<>sitting EOB with mod A PT Short Term Goal 2 (Week 1): Pt will transfer bed<>chair with LRAD mod A PT Short Term Goal 3 (Week 1): Pt will perform simulated car transfer with LRAD max A  Skilled Therapeutic Interventions/Progress Updates:   Received pt sitting in TIS WC, pt agreeable to therapy, and denied any pain during session. Pt with continued aphasia. Session with emphasis on functional mobility/transfers, dynamic sitting/standing balance/coordination, gait training, and improved activity tolerance. Doffed non-skid socks and donned regular socks and R AFO max/total A for improved ankle stability and R LE foot clearance. Pt transported to dayroom in [facility] total A for time management purposes. Pt transferred sit<>stand mod A +2 and donned LiteGait harness in standing with max A +3. Pt ambulated 49ft x 1 1ft x 1 and 45ft x 1 trial in LiteGait over level ground with +3 assist. Pt required max/total A of 1 for manual facilitation to advance R LE and control R knee buckling/hyperextension and facilitate R hip extension, +2 assist for weight shifting and pelvic control/alignment, and +3 assist to steer LiteGait and maintain R UE support. Pt required verbal cues for stepping sequence, weight shifting, and upright posture as pt with downward gaze. Pt required multiple rest breaks throughout session due to increased fatigue. During rest break, encouraged communication with cues to slow down and focus on one word at a time; pt able to state word "paint". Pt transported back to room in TIS WC total A. Concluded session with pt semi-reclined in TIS WC, needs within reach, and seatbelt alarm on.   Therapy  Documentation Precautions:  Precautions Precautions: Fall Precaution Comments: pusher, dense R hemiparesis Restrictions Weight Bearing Restrictions: No  Therapy/Group: Individual Therapy Alfonse Alpers PT, DPT   02/09/2020, 7:33 AM

## 2020-02-09 NOTE — Progress Notes (Signed)
Speech Language Pathology Daily Session Note  Patient Details  Name: Dustin Schneider MRN: [id number] Date of Birth: Feb 14, 1956  Today's Date: 02/09/2020 SLP Individual Time: 2542-7062 SLP Individual Time Calculation (min): 57 min  Short Term Goals: Week 1: SLP Short Term Goal 1 (Week 1): Pt will consume current diet with minimal overt s/sx aspiration, efficient mastication and oral clearance with Supervision A verbal cues for use of strategies. SLP Short Term Goal 2 (Week 1): Pt will consume trials of thin liquids with minimal overt s/sx aspiration over 3 sessions prior to advancement. SLP Short Term Goal 3 (Week 1): Pt will consume trials of dysphagia 3 (mech soft) solids demonstrating efficient mastication and oral clearance X2 prior to advancement. SLP Short Term Goal 4 (Week 1): Pt will use multimodal means of communication to express basic wants and needs with Max A multimodal cues. SLP Short Term Goal 5 (Week 1): Pt will participate in automatic speech tasks with 80% accuracy provided Max A multimodal cues. SLP Short Term Goal 6 (Week 1): Pt will verbalize at the word level with Max A multimodal cues.  Skilled Therapeutic Interventions: Pt was seen for skilled ST targeting dysphagia and communication. SLP provided upgraded trials of thin H2O after pt completed oral care via suction toothbrush with set up assist. Pt exhibited 1 subtle throat clear out of 10 presentations of ice chips. Three throat clears noted across consumption of ~6oz thin H2O via cup (self fed by pt, very responsive to cues for small sips, slow rate). SLP also provided trial of advanced Dys 3 (mech soft) solids, during which pt's mastication was slightly prolonged but functional. Pt achieved good oral clearance with use of liquids washes. No overt s/sx aspiration noted with solid intake. His overall intake was limited, as he politely declined further trials after consuming only 1/3 of snack. Recommend continue current diet  for now.   SLP provided Max faded to Mod A phonemic and semantic (sentence completion) and written cues for pt to produce meaningful verbalizations at the word level (sailor, professor, Training and development officer, [employer], beach, boat). Pt also able to extend these to phrase level using a carrier phrase. Pt also demonstrated ability to use a word-communication board with 100% accuracy and Min A verbal prompts today. During automatic speech tasks, he sang happy birthday with 90% accuracy and main chorus of you are my sunshine with 70% accuracy. Pt left sitting in chair with alarm set and needs within reach. Continue per current plan of care.        Pain Pain Assessment Pain Scale: 0-10 Pain Score: 0-No pain Faces Pain Scale: No hurt  Therapy/Group: Individual Therapy  Arbutus Leas 02/09/2020, 7:23 AM

## 2020-02-10 ENCOUNTER — Inpatient Hospital Stay (HOSPITAL_COMMUNITY): Payer: BC Managed Care – PPO

## 2020-02-10 ENCOUNTER — Encounter (HOSPITAL_COMMUNITY): Payer: BC Managed Care – PPO | Admitting: Psychology

## 2020-02-10 ENCOUNTER — Inpatient Hospital Stay (HOSPITAL_COMMUNITY): Payer: BC Managed Care – PPO | Admitting: Occupational Therapy

## 2020-02-10 ENCOUNTER — Inpatient Hospital Stay (HOSPITAL_COMMUNITY): Payer: BC Managed Care – PPO | Admitting: Speech Pathology

## 2020-02-10 DIAGNOSIS — R339 Retention of urine, unspecified: Secondary | ICD-10-CM

## 2020-02-10 DIAGNOSIS — R030 Elevated blood-pressure reading, without diagnosis of hypertension: Secondary | ICD-10-CM

## 2020-02-10 MED ORDER — TAMSULOSIN HCL 0.4 MG PO CAPS
0.4000 mg | ORAL_CAPSULE | Freq: Every day | ORAL | Status: DC
  Administered 2020-02-10 – 2020-02-17 (×8): 0.4 mg via ORAL
  Filled 2020-02-10 (×8): qty 1

## 2020-02-10 NOTE — Progress Notes (Signed)
Speech Language Pathology Weekly Progress and Session Note  Patient Details  Name: Dustin Schneider MRN: [id number] Date of Birth: 01/29/1956  Beginning of progress report period: February 03, 2020 End of progress report period: February 10, 2020  Today's Date: 02/10/2020 SLP Individual Time: 4097-3532 SLP Individual Time Calculation (min): 60 min  Short Term Goals: Week 1: SLP Short Term Goal 1 (Week 1): Pt will consume current diet with minimal overt s/sx aspiration, efficient mastication and oral clearance with Supervision A verbal cues for use of strategies. SLP Short Term Goal 1 - Progress (Week 1): Met SLP Short Term Goal 2 (Week 1): Pt will consume trials of thin liquids with minimal overt s/sx aspiration over 3 sessions prior to advancement. SLP Short Term Goal 2 - Progress (Week 1): Progressing toward goal SLP Short Term Goal 3 (Week 1): Pt will consume trials of dysphagia 3 (mech soft) solids demonstrating efficient mastication and oral clearance X2 prior to advancement. SLP Short Term Goal 3 - Progress (Week 1): Progressing toward goal SLP Short Term Goal 4 (Week 1): Pt will use multimodal means of communication to express basic wants and needs with Max A multimodal cues. SLP Short Term Goal 4 - Progress (Week 1): Met SLP Short Term Goal 5 (Week 1): Pt will participate in automatic speech tasks with 80% accuracy provided Max A multimodal cues. SLP Short Term Goal 5 - Progress (Week 1): Met SLP Short Term Goal 6 (Week 1): Pt will verbalize at the word level with Max A multimodal cues. SLP Short Term Goal 6 - Progress (Week 1): Met    New Short Term Goals: Week 2: SLP Short Term Goal 1 (Week 2): Pt will consume current diet with minimal overt s/sx aspiration, efficient mastication and oral clearance Mod I for use of strategies. SLP Short Term Goal 2 (Week 2): Pt will consume trials of thin liquids with minimal overt s/sx aspiration over 3 sessions prior to repeat MBSS. SLP Short Term  Goal 3 (Week 2): Pt will consume trials of dysphagia 3 (mech soft) solids demonstrating efficient mastication and oral clearance X2 prior to advancement. SLP Short Term Goal 4 (Week 2): Pt will use multimodal means of communication to express basic wants and needs with Mod A multimodal cues. SLP Short Term Goal 5 (Week 2): Pt will verbalize at the word level with Mod A multimodal cues. SLP Short Term Goal 6 (Week 2): Pt will detect functional and/or verbal errors with Mod A multimodal cues.  Weekly Progress Updates: Pt has made functional gains and met 4 out of 6 short term goals. Pt is currently Max assist for verbal communication at the word level and expressing wants and needs via multimodal means due to severe non-fluent expressive aphasia. Pt also requires Min-Mod for basic problem solving and emergent awareness. He is consuming a dysphagia 2 (minced/fine chop) texture diet with nectar thick liquids, requiring Supervision level cues for use of safe swallow strategies. He is currently working toward advancement with Dys 3 solid and thin liquid trials at bedside, however repeat MBSS is indicated prior to liquid advancement. Pt has demonstrated improved ability to verbalize at the word level, as well as oral phase swallow function with advanced solid trials. Pt and family education is ongoing. Pt would continue to benefit from skilled ST while inpatient in order to maximize functional independence and reduce burden of care prior to discharge. Anticipate that pt will need 24/7 supervision at discharge in addition to [place] follow up at next  level of care.     Intensity: Minumum of 1-2 x/day, 30 to 90 minutes Frequency: 3 to 5 out of 7 days Duration/Length of Stay: 3-4 weeks Treatment/Interventions: Cognitive remediation/compensation;Cueing hierarchy;Dysphagia/aspiration precaution training;Functional tasks;Patient/family education;Therapeutic Activities;Speech/Language facilitation;Multimodal communication  approach;Internal/external aids   Daily Session  Skilled Therapeutic Interventions:  Pt was seen for skilled ST targeting dysphagia and communication goals. SLP facilitated session with trials of upgraded liquids and solids to work toward diet advancement and oropharyngeal swallow function deficits. Pt completed oral care at sink with some physical assistance setting up. He exhibited wet vocal quality X1, subtle throat clear X2, and 1 strong immediate cough response throughout intake of ~6oz thin H2O (self fed cup sips). No overt s/sx aspiration noted with intake of small ice chips. During consumption of Dysphagia 3 (mechanical soft) solid snack, pt demonstrated efficient mastication and oral clearance with Supervision A verbal cues for use of liquid wash.  Would recommend trial tray or larger quantity snack of Dys 3 prior to advancement. Recommend continue current diet.  Pt demonstrated ability to verbally read at the word level from his communication with with ~80% accuracy, and 90% accuracy when demonstrating it's use to communicate basic needs via pointing with Min A verbal question cues, and 1 verbal cue for error awareness and correction. Pt with 5/6 accuracy when verbally naming common objects from [facility] activity box; Although semantic and phonetic cues were provided, pt unable to produce word without additional of a written cue in 4/5 successful verbalizations. Pt left sitting in chair with alarm set and needs within reach. Continue per current plan of care.      Pain Pain Assessment Pain Scale: 0-10 Pain Score: 0-No pain  Therapy/Group: Individual Therapy  Arbutus Leas 02/10/2020, 7:12 AM

## 2020-02-10 NOTE — Consult Note (Signed)
Neuropsychological Consultation   Patient:   Dustin Schneider   DOB:   06-02-1956  MR Number:  [id number]  Location:  [place] [id number] [place] [address] Dept: [place]: [phone]           Date of Service:   02/10/2020  Start Time:   2 PM End Time:   3 PM  Provider/Observer:  Ilean Skill, Psy.D.       Clinical Neuropsychologist       Billing Code/Service: 97026  Chief Complaint:    [name]. Kerstetter is a 64 year old male with history of PAF, NICM.  Patient was admitted on 01/27/2020 after collapsing while in a home-improvement store.  The patient was found to have right-sided weakness with left gaze preference as well as nausea and vomiting.  Head CT done on admission revealed hyperdense signal left M2 branch with acute thrombosis.  Patient underwent cerebral angio with complete recanalization of L-MCA and ACA.  MRI brain done revealed left basal ganglia infarct with edema and restricted diffusion in left cingulate and left superior frontal/perirolandic gyrus with mild edema, petechial hemorrhage at caudate and small right inferior frontal gyrus cavernous cerebral vascular malformation.  Patient continues to be limited by right-sided weakness with right lean and significant right motor deficits and severe nonfluent expressive aphasia.  The patient had been having difficulties following simple commands but this is improved significantly.  CIR is recommended due to functional deficits.  Reason for Service:  The patient was referred for neuropsychological consultation due to residual cognitive deficits post cerebrovascular event.  Patient has had subcortical and cortical involvement with his stroke.  Below is the HPI for the current admission.  HPI: Dustin Schneider is a 64 year old male with history of PAF- on Xarelto, NICM; who was admitted on 01/27/20 after collapsing at [facility]  hardware. He was found to have right sided weakness with left gaze preference as well as N/V. CT head done at admission revealing hyperdense sign left M2 branch c/w acute thrombus. He underwent cerebral angio with complete recanalization of L-MCA and ACA. 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  2D echo showed EF 55-60% with moderate biatrial dilatation, mild MVR and grade  2 DD.   He tolerated extubation on 06/18 and made NPO due to facial weakness with signs of dysphagia and risk for silent aspiration. Patient with dense right hemiplegia with aphasia. Cortak placed and he was started on tube feeds.   He was maintained on ASA given IR petechial hemorrhage and transitioned to Eliquis 06/22. He continues to be limited by right sided weakness with right lean, apraxia with limited verbal output and needs multimodal cues to follow simple commands. CIR recommended due to functional deficits.  Current Status:  The patient displayed significant non-fluent aphasia the patient was unable to have any spontaneous speech beyond attempting to say singular words and even that there were significant impairments.  The patient also indicated that he was unable to write down responses as well.  He did have a cue card for certain words to be able to more effectively communicate.  The patient was able to express understanding of questions and it was clear that the patient continues to be aware of what is being  said and understand what is being said (good receptive language skills) and formulate appropriate goals and display good awareness and orientation.  There was a question about whether the patient could assign power of attorney and medical power of attorney and I do think that patient is  competent to be able to sign power of attorney and while he will be unable to provide his previous signature to these documents he is competent and aware to the point where this would be a valid and appropriate assignment of power of attorney and medical power of attorney.  The patient denied any significant mood disturbance that would keep him from being able to fully participate in therapeutic efforts.  Formal testing was not conducted due to the patient's severe motor deficits and severe expressive language deficits both oral as well as written.  Behavioral Observation: SHEPPARD LUCKENBACH  presents as a 64 y.o.-year-old Right Caucasian Male who appeared his stated age. his dress was Appropriate and he was Well Groomed and his manners were Appropriate to the situation.  his participation was indicative of Appropriate behaviors.  There were physical disabilities noted.  he displayed an appropriate level of cooperation and motivation.     Interactions:    Active Appropriate and Redirectable  Attention:   abnormal and attention span appeared shorter than expected for age  Memory:   within normal limits; recent and remote memory intact  Visuo-spatial:  not examined  Speech (Volume):  low  Speech:   non-fluent aphasia;   Thought Process:  Coherent and Relevant  Though Content:  WNL; not suicidal and not homicidal  Orientation:   person, place, time/date and situation  Judgment:   Fair  Planning:   Fair  Affect:    Appropriate  Mood:    Dysphoric  Insight:   Fair  Intelligence:   normal  edical History:   Past Medical History:  Diagnosis Date  . Hypertension   . NICM (nonischemic cardiomyopathy) ([place]) 06/15/2018   04/23/18: EF 45%, 09/15/18: NOrmal LVEF  . Paroxysmal atrial fibrillation ([place]) 06/15/2018  . Visit for monitoring Tikosyn therapy 06/12/2018    Psychiatric History:  No prior psychiatric history  Family Med/Psych History:  Family History  Problem Relation Age of Onset   . Glaucoma Mother   . Atrial fibrillation Father     Impression/DX:  Dustin Schneider. Dustin Schneider is a 64 year old male with history of PAF, NICM.  Patient was admitted on 01/27/2020 after collapsing while in a home-improvement store.  The patient was found to have right-sided weakness with left gaze preference as well as nausea and vomiting.  Head CT done on admission revealed hyperdense signal left M2 branch with acute thrombosis.  Patient underwent cerebral angio with complete recanalization of L-MCA and ACA.  MRI brain done revealed left basal ganglia infarct with edema and restricted diffusion in left cingulate and left superior frontal/perirolandic gyrus with mild edema, petechial hemorrhage at caudate and small right inferior frontal gyrus cavernous cerebral vascular malformation.  Patient continues to be limited by right-sided weakness with right lean and significant right motor deficits and severe nonfluent expressive aphasia.  The patient had been having difficulties following simple commands but this is improved significantly.  CIR is recommended due to functional deficits.  The patient displayed significant non-fluent aphasia.  The patient was unable to have any spontaneous speech beyond attempting to say singular words and even with that there were significant impairments.  The patient also indicated that he was unable to write down responses  as well.  He did have a cue card for certain words to be able to more effectively communicate.  The patient was able to express understanding of questions and it was clear that the patient continues to be aware of what is being said and understand what is being said (good receptive language skills) and formulate appropriate goals and display good awareness and orientation.  There was a question about whether the patient could assign power of attorney and medical power of attorney and I do think that patient is competent to be able to sign power of attorney and while he  will be unable to provide his previous signature to these documents he is competent and aware to the point where this would be a valid and appropriate assignment of power of attorney and medical power of attorney.  The patient denied any significant mood disturbance that would keep him from being able to fully participate in therapeutic efforts.  Formal testing was not conducted due to the patient's severe motor deficits and severe expressive language deficits both oral as well as written.  Disposition/Plan:  I will follow up with the patient first of next week.  Diagnosis:    Cerebrovascular infarction of the left MCA with ACA involvement.  Nonfluent expressive aphasia        Electronically Signed   _______________________ Ilean Skill, Psy.D.

## 2020-02-10 NOTE — Progress Notes (Signed)
Occupational Therapy Weekly Progress Note  Patient Details  Name: Dustin Schneider MRN: [id number] Date of Birth: May 09, 1956  Beginning of progress report period: February 03, 2020 End of progress report period: February 10, 2020  Today's Date: 02/10/2020 OT Individual Time: 4709-6283 OT Individual Time Calculation (min): 61 min    Patient has met 4 of 4 short term goals. Pt is making steady progress towards goals. Pt requires minA for sitting balance increasing to modA due to decreased trunk instability, decreased awareness of R side/R sided weakness, increases with fatigue. Pt is completing sit <> stands with minA and multimodal cues for safety and sequencing. Pt is dressing UB with minA and dressing LB with maxA due to standing balance. Pt would benefit from LB dressing at sink in future sessions to improve standing balance. Pt is increasingly attending to R visual field without cueing and to the RUE during ADLs but continues to require cues to attend to RUE during transfers. Pt would benefit from continued NMR, education to carryover hemi-dressing techniques, and dynamic standing balance for LB dressing/bathing due to decreased standing balance and decreased balance reactions.   Patient continues to demonstrate the following deficits: muscle weakness, abnormal tone and unbalanced muscle activation, decreased visual perceptual skills, decreased midline orientation and decreased attention to right, decreased awareness and decreased sitting balance, decreased standing balance, decreased postural control and hemiplegia and therefore will continue to benefit from skilled OT intervention to enhance overall performance with BADL.  Patient progressing toward long term goals.  Continue plan of care.  OT Short Term Goals Week 1:  OT Short Term Goal 1 (Week 1): Pt will locate all ADL items on R of sink/table with no VC OT Short Term Goal 1 - Progress (Week 1): Met OT Short Term Goal 2 (Week 1): Pt will wash  RUE during UB bathing wiht no VC to demo improved R inattention OT Short Term Goal 2 - Progress (Week 1): Met OT Short Term Goal 3 (Week 1): Pt will complete 1/4 steps of UB dressing OT Short Term Goal 3 - Progress (Week 1): Met OT Short Term Goal 4 (Week 1): Pt will sit EOB/EOM statically with no more than MIN A to maintain balance for 3 min OT Short Term Goal 4 - Progress (Week 1): Met   Week 2:  OT Short Term Goal 1 (Week 2): Pt will attend to RUE without verbal cues during functional transfers. OT Short Term Goal 2 (Week 2): Pt will don shirt, carrying over hemi dressing techniques without verbal cues. OT Short Term Goal 3 (Week 2): Pt complete last step of donning pants to pull pants over waist with modA. OT Short Term Goal 4 (Week 2): Pt will complete a functional dynamic sitting task for 2 minutes with no more than close supervision.  Skilled Therapeutic Interventions/Progress Updates:    Treatment session with focus on ADL retraining and functional sit <> stands. Pt received supine in bed with RN present. Completed sit to supine with modA and with min verbal cueing for sequencing. Pt completed squat pivot transfer bed < TIS with modA and with OTS facilitating anterior weight shift at trunk, placement of RLE, and cueing to attend to RUE. Doffed and donned socks with modA to stabilize BLE in figure 4 due to decreased ROM in BLE this morning. Educated on use of a washcloth to stabilize legs for figure 4 dressing. Doffed shirt with CGA and donned shirt with minA to assist with pulling shirt down back seated  in TIS at sink. Facilitated set up and multimodal cueing to carryover sequencing of arm-arm-head hemi dressing technique. Pt declined washing UB or changing pants. Completed oral care seated in TIS with modA to complete one-handed technique, with pt requiring increased time for thoroughness. Educated on one-hand technique, pt would benefit from continued education to carryover. Completed sit <>  stands from TIS at sink with minA for powerup and minA increasing to modA due to fatigue to maintain static standing balance. Increased challenge to incorporate weight shifting at mirror to promote awareness of midline, pt with improved awareness of R lean and increased ability to self-correct compared to previous sessions. Facilitated functional reach with LUE to increase dynamic balance challenge, pt requiring increased assistance from minA to modA due to increased R lean and increased RLE weakness and instability. Ended session with pt seated in TIS with all needs within reach and belt alarm on.  Therapy Documentation Precautions:  Precautions Precautions: Fall Precaution Comments: pusher, dense R hemiparesis Restrictions Weight Bearing Restrictions: No General:   Vital Signs: Therapy Vitals Temp: 97.6 F (36.4 C) Pulse Rate: 67 Resp: 16 BP: (!) 110/97 Patient Position (if appropriate): Lying Oxygen Therapy SpO2: 98 % O2 Device: Room Air Pain: No pain reported.     Therapy/Group: Individual Therapy  Michelle Nasuti 02/10/2020, 7:08 AM

## 2020-02-10 NOTE — Progress Notes (Signed)
Physical Therapy Weekly Progress Note  Patient Details  Name: Dustin Schneider MRN: [id number] Date of Birth: 23-May-1956  Beginning of progress report period: February 03, 2020 End of progress report period: February 10, 2020  Patient has met 3 of 3 short term goals. Pt demonstrates steady progress towards long term goals. Pt currently requires mod A for bed mobility, mod A for squat<>pivot transfers to L and R, max A +2 to ambulate 43f with L handrail, and max A +3 to ambulate 351fin LiteGait. Pt continues to be limited by aphasia, dense R hemiparesis, pushing to R and poor midline orientation, decreased safety awareness, and decreased motor control, coordination, and sequencing. However, pt demonstrates improvements in all impairments listed above since evaluation.   Patient continues to demonstrate the following deficits muscle weakness, impaired timing and sequencing, abnormal tone, unbalanced muscle activation, decreased coordination and decreased motor planning and decreased sitting balance, decreased standing balance, decreased postural control, hemiplegia and decreased balance strategies and therefore will continue to benefit from skilled PT intervention to increase functional independence with mobility.  Patient progressing toward long term goals..  Continue plan of care.  PT Short Term Goals Week 1:  PT Short Term Goal 1 (Week 1): Pt will transfer supine<>sitting EOB with mod A PT Short Term Goal 1 - Progress (Week 1): Met PT Short Term Goal 2 (Week 1): Pt will transfer bed<>chair with LRAD mod A PT Short Term Goal 2 - Progress (Week 1): Met PT Short Term Goal 3 (Week 1): Pt will perform simulated car transfer with LRAD max A PT Short Term Goal 3 - Progress (Week 1): Met Week 2:  PT Short Term Goal 1 (Week 2): Pt will perform bed mobility with min A PT Short Term Goal 2 (Week 2): Pt will transfer bed<>chair with LRAD min A PT Short Term Goal 3 (Week 2): Pt will ambulate 1574fith LRAD max  A of 1  Skilled Therapeutic Interventions/Progress Updates:  Ambulation/gait training;Discharge planning;Functional mobility training;Psychosocial support;Therapeutic Activities;Balance/vestibular training;Disease management/prevention;Neuromuscular re-education;Skin care/wound management;Therapeutic Exercise;Wheelchair propulsion/positioning;Cognitive remediation/compensation;DME/adaptive equipment instruction;Pain management;Splinting/orthotics;UE/LE Strength taining/ROM;Community reintegration;Functional electrical stimulation;Patient/family education;Stair training;UE/LE Coordination activities   Therapy Documentation Precautions:  Precautions Precautions: Fall Precaution Comments: pusher, dense R hemiparesis Restrictions Weight Bearing Restrictions: No  Therapy/Group: Individual Therapy AnnAlfonse Alpers, DPT   02/10/2020, 7:37 AM

## 2020-02-10 NOTE — Progress Notes (Signed)
Patient ID: Dustin Schneider, male   DOB: 06-23-1956, 64 y.o.   MRN: [id number]  Team Conference Report to Patient/Family  Team Conference discussion was reviewed with the patient and caregiver, including goals, any changes in plan of care and target discharge date.  Patient and caregiver express understanding and are in agreement.  The patient has a target discharge date of 03/02/20.  Dyanne Iha 02/10/2020, 2:11 PM

## 2020-02-10 NOTE — Progress Notes (Signed)
Dustin Schneider PHYSICAL MEDICINE & REHABILITATION PROGRESS NOTE  Subjective/Complaints: Patient seen sitting up in bed this morning.  He indicates he did not stay well overnight, but no particular reason.  ROS: limited by aphasia, but appears to deny CP, shortness of breath, nausea, vomiting and diarrhea.  Objective: Vital Signs: Blood pressure (!) 110/97, pulse 67, temperature 97.6 F (36.4 C), resp. rate 16, height 5\' 9"  (1.753 m), weight 79.8 kg, SpO2 98 %. No results found. Recent Labs    02/08/20 0740 02/09/20 0619  WBC 12.9* 10.5  HGB 14.6 13.4  HCT 44.6 41.2  PLT 333 343   Recent Labs    02/08/20 0740  NA 137  K 4.4  CL 103  CO2 24  GLUCOSE 124*  BUN 23  CREATININE 0.89  CALCIUM 8.8*    Physical Exam: BP (!) 110/97 (BP Location: Right Arm)   Pulse 67   Temp 97.6 F (36.4 C)   Resp 16   Ht 5\' 9"  (1.753 m)   Wt 79.8 kg   SpO2 98%   BMI 25.98 kg/m  Constitutional: No distress . Vital signs reviewed. HENT: Normocephalic.  Atraumatic. Eyes: EOMI. No discharge. Cardiovascular: No JVD.   Respiratory: Normal effort.  No stridor. GI: Non-distended. Skin: Warm and dry.  Intact. Psych: Flat. Musc: No edema in extremities.  No tenderness in extremities. Neuro: Alert Expressive >receptive aphasia, improving Motor: Appears to be 5/5 on left side and 0/5 on right side, stable No increase in tone appreciated  Assessment/Plan: 1. Functional deficits secondary to left MCA infarct which require 3+ hours per day of interdisciplinary therapy in a comprehensive inpatient rehab setting.  Physiatrist is providing close team supervision and 24 hour management of active medical problems listed below.  Physiatrist and rehab team continue to assess barriers to discharge/monitor patient progress toward functional and medical goals  Care Tool:  Bathing    Body parts bathed by patient: Right arm, Chest, Abdomen, Front perineal area, Left upper leg, Face, Right upper leg    Body parts bathed by helper: Left arm     Bathing assist Assist Level: Moderate Assistance - Patient 50 - 74%     Upper Body Dressing/Undressing Upper body dressing   What is the patient wearing?: Pull over shirt    Upper body assist Assist Level: Minimal Assistance - Patient > 75%    Lower Body Dressing/Undressing Lower body dressing      What is the patient wearing?: Pants     Lower body assist Assist for lower body dressing: Maximal Assistance - Patient 25 - 49%     Toileting Toileting    Toileting assist Assist for toileting: Maximal Assistance - Patient 25 - 49%     Transfers Chair/bed transfer  Transfers assist  Chair/bed transfer activity did not occur: Safety/medical concerns  Chair/bed transfer assist level: Moderate Assistance - Patient 50 - 74%     Locomotion Ambulation   Ambulation assist      Assist level: 2 helpers Assistive device: Lite Gait Max distance: 20ft   Walk 10 feet activity   Assist  Walk 10 feet activity did not occur: Safety/medical concerns  Assist level: 2 helpers Assistive device: Lite Gait   Walk 50 feet activity   Assist Walk 50 feet with 2 turns activity did not occur: Safety/medical concerns         Walk 150 feet activity   Assist Walk 150 feet activity did not occur: Safety/medical concerns (R hemi, LE weakness, decreased balance/postural  control)         Walk 10 feet on uneven surface  activity   Assist Walk 10 feet on uneven surfaces activity did not occur: Safety/medical concerns (R hemi, LE weakness, decreased balance/postural control)         Wheelchair     Assist Will patient use wheelchair at discharge?: Yes Type of Wheelchair: Manual    Wheelchair assist level: Dependent - Patient 0%      Wheelchair 50 feet with 2 turns activity    Assist        Assist Level: Dependent - Patient 0%   Wheelchair 150 feet activity     Assist     Assist Level: Dependent -  Patient 0%      Medical Problem List and Plan: 1.  Impaired mobility and ADLs secondary to left MCA ischemic stroke  Continue CIR  WHO/PRAFO nightly, discussed with nursing  Team conference today to discuss current and goals and coordination of care, home and environmental barriers, and discharge planning with nursing, case manager, and therapies.  2.  Antithrombotics: -DVT/anticoagulation:  Pharmaceutical: Other (comment)--Eliquis             -antiplatelet therapy: N/A 3. Pain Management: Tylenol prn.  4. Mood: LCSW to follow for evaluation and support.              -antipsychotic agents: N/A 5. Neuropsych: This patient is not capable of making decisions on his own behalf.  Trial of amantadine started on 6/26, will consider d/cing after discussion with therapies 6. Skin/Wound Care: N/A 7. Fluids/Electrolytes/Nutrition: Monitor I/Os. 8. PAF: Monitor HR tid--has been off Tikosyn since admission. Continue metoprolol bid with Eliquis.   Per cardiology, continue to monitor-no need for Tikosyn at present  Rate controlled on 6/30 9. HTN: Monitor BP--managed on metoprolol and Avapro. Has been elevated.   Elevated diastolic pressure on 0/17, otherwise relatively controlled  Monitor with increased mobility 10. Dyslipidemia: Continue Crestor. 11.  Post stroke dysphagia: Dysphagia 2, nectar liquids with chin tuck.   Hold tube feeds and start calorie count.   NG DC'd  Advance diet as tolerated  12. Impaired fasting glucose: Likely secondary to tube feeds  Elevated on 6/25  Continue to monitor 13. Nonischemic cardiomyopathy: outpatient cardiology follow-up. 14.  Hypoalbuminemia  Supplement initiated on 6/23 15.  Leukocytosis: Resolved  WBCs 10.5 on 6/29  Afebrile  Continue to monitor 16.  AKI:   Creatinine 0.89 on 6/28 after fluid bolus, plan to order labs for the end of the week  Encourage fluids  Continue to monitor 17. Constipation  Improving 18.  Urinary retention   Flomax  started on 6/30  LOS: 8 days A FACE TO FACE EVALUATION WAS PERFORMED  Dantrell Schertzer [name] 02/10/2020, 9:13 AM

## 2020-02-10 NOTE — Progress Notes (Signed)
Physical Therapy Session Note  Patient Details  Name: Dustin Schneider MRN: [id number] Date of Birth: 1956/05/31  Today's Date: 02/10/2020 PT Individual Time: 7741-2878 and 6767-2094  PT Individual Time Calculation (min): 53 min and 24 min  Short Term Goals: Week 1:  PT Short Term Goal 1 (Week 1): Pt will transfer supine<>sitting EOB with mod A PT Short Term Goal 1 - Progress (Week 1): Met PT Short Term Goal 2 (Week 1): Pt will transfer bed<>chair with LRAD mod A PT Short Term Goal 2 - Progress (Week 1): Met PT Short Term Goal 3 (Week 1): Pt will perform simulated car transfer with LRAD max A PT Short Term Goal 3 - Progress (Week 1): Met Week 2:  PT Short Term Goal 1 (Week 2): Pt will perform bed mobility with min A PT Short Term Goal 2 (Week 2): Pt will transfer bed<>chair with LRAD min A PT Short Term Goal 3 (Week 2): Pt will ambulate 70f with LRAD max A of 1  Skilled Therapeutic Interventions/Progress Updates:  Ambulation/gait training;Discharge planning;Functional mobility training;Psychosocial support;Therapeutic Activities;Balance/vestibular training;Disease management/prevention;Neuromuscular re-education;Skin care/wound management;Therapeutic Exercise;Wheelchair propulsion/positioning;Cognitive remediation/compensation;DME/adaptive equipment instruction;Pain management;Splinting/orthotics;UE/LE Strength taining/ROM;Community reintegration;Functional electrical stimulation;Patient/family education;Stair training;UE/LE Coordination activities   Today's Interventions: Treatment Session 1: 1303-1356 53 min Received pt sitting in TIS WC, pt agreeable to therapy, and denied any pain during session. [name] brother in law, WRedmond Pulling present at bedside. Therapist provided updates pf pt's CLOF and discussed equipment and therapy after discharge. Pt with continues aphasia. Session with emphasis on functional mobility/transfers, generalized strengthening, dynamic standing balance/coordination, NMR,  motor control/planning, midline orientation, gait training, and improved activity tolerance. Donned R AFO and shoes max A for time management purposes and [facility]. Pt transferred TIS WC<>bed via squat<>pivot mod A with therapist blocking R knee. Pt transferred sit<>supine with mod A for R LE management and rolled to L and R with supervision. Pt transferred supine<>sitting EOB with mod A for R LE/UE management and squat<>pivot bed<>TIS WC mod A. Pt transported to therapy gym in TIS WC total A. Pt ambulated 236fx 2 trials with L handrail max A +2 for close WC follow using mirror for visual feedback. Pt required manual facilitation to advance R LE, control R knee hyperextension and buckling, and weight shift to L to clear R LE. Pt also required verbal cues for upright posture. Worked on dynamic standing balance with emphasis on weight shifting to L hooking/unhooking horseshoes on top of mirror to/from L handrail x 2 trials with min A for balance. Pt with 2 LOB posteriorly and to the R requiring mod/max A to correct. Pt transported back to room in TIS WC total A. Concluded session with pt semi-reclined in TIS WC, needs within reach, and seatbelt alarm on. Hemiplegic arm supported on pillow.   Treatment Session 2: [id number] min Received pt sitting in TIS WC, pt agreeable to therapy, and denied any pain during session. [name] brother in law present at bedside. Session with emphasis on functional mobility/transfers, generalized strengthening, dynamic standing balance/coordination, NMR, motor control/planning, midline orientation, and improved activity tolerance. [facility] and [facility] WC<>mat squat<>pivot mod A x 2 trials with therapist blocking R knee and cues to clear buttocks. Pt transferred sit<>stand mod A x 3 trials with R UE supported around therapist and performed mini-squats 3x5 with emphasis on midline  orientation, weight shifting to L, and bilateral hip/knee extension using  mirror for visual feedback. Pt transported back to room in TIS WC total A and transferred TIS WC<>bed squat<>pivot mod A and doffed shoes and AFO total A. Sit<>supine with mod A for R LE management. Concluded session with pt supine in bed, needs within reach, and bed alarm on. Hemiplegic arm supported on pillow.   Therapy Documentation Precautions:  Precautions Precautions: Fall Precaution Comments: pusher, dense R hemiparesis Restrictions Weight Bearing Restrictions: No  Therapy/Group: Individual Therapy Alfonse Alpers PT, DPT   02/10/2020, 7:43 AM

## 2020-02-10 NOTE — Patient Care Conference (Signed)
Inpatient RehabilitationTeam Conference and Plan of Care Update Date: 02/10/2020   Time: 2:40 PM    Patient Name: Dustin Schneider      Medical Record Number: [id number]  Date of Birth: Jan 03, 1956 Sex: Male         Room/Bed: 4W14C/4W14C-02 Payor Info: Payor: [place] / Plan: [facility] PPO / Product Type: *No Product type* /    Admit Date/Time:  02/02/2020  4:11 PM  Primary Diagnosis:  Acute ischemic left MCA stroke [facility])  Patient Active Problem List   Diagnosis Date Noted  . Urinary retention   . Blood pressure increase diastolic   . AKI (acute kidney injury) ([place])   . Global aphasia   . Leukocytosis   . Hypoalbuminemia due to protein-calorie malnutrition ([place])   . Hyperglycemia   . Dysphagia, post-stroke   . PAF (paroxysmal atrial fibrillation) ([place])   . Hyperlipidemia 02/02/2020  . Dysphagia following cerebral infarction 02/02/2020  . Aspiration pneumonia ([place]) 02/02/2020  . Acute ischemic left MCA stroke ([place]) 02/02/2020  . Acute respiratory failure ([place])   . Acute ischemic stroke [facility]) s/p clot retrieval L MCA & ACA A3 01/27/2020  . Aspiration into airway 01/27/2020  . Chronic anticoagulation 01/27/2020  . Paroxysmal atrial fibrillation ([place]) 06/15/2018  . Essential hypertension 06/15/2018  . NICM (nonischemic cardiomyopathy) ([place]) 06/15/2018  . Visit for monitoring Tikosyn therapy 06/12/2018    Expected Discharge Date: Expected Discharge Date: 03/02/20  Team Members Present: Physician leading conference: Dr. Delice Lesch Care Coodinator Present: Dorien Chihuahua, RN, BSN, CRRN;Christina Sampson Goon, BSW Nurse Present: Rayne Du, LPN PT Present: Becky Sax, PT OT Present: Simonne Come, OT SLP Present: Jettie Booze, CF-SLP PPS Coordinator present : Gunnar Fusi, SLP     Current Status/Progress Goal Weekly Team Focus  Bowel/Bladder   Patient continent of BM. LBM 02/08/20.  Inability to urinate with Q6H bladder scans and straight caths needed  for volumer greater than 350 mL  Patient will achieve urinary continence; maintain bowel continence with regular patterns  Assess GI/GU Q6H and PRN.  Encourage hydration, nutrition, frequent toileting.   Swallow/Nutrition/ Hydration   Dys 2, nectar  Mod I  trials thin and Dys 3, preparing for repeat MBSS   ADL's   bed mobility min-modA, squat pivot transfers modA, ADLs mod-maxA  Supervision-minA  NMR, ADL retraining, dynamic sitting balance, functional transfers   Mobility   squat<>pivot and stand<>pivot transfers mod/max A, sit<>stand mod A+2, gait 80ft with L handrail max A +2  min A  functional mobility/transfers, midline orientation, dynamic sitting/standing balance/coordination, gait training, NMR, endurance.   Communication   Mod-Max verbal expression, Min-Mod communicating basic wants and needs  Min  verbalizing word level, word finding, naming common objects, multimodal communication   Safety/Cognition/ Behavioral Observations  Min A  Supervision  emergent awareness, basic functionla problem solving   Pain   Patient denies any pain or discomfort.  No PRN medications administered.  Patient will maintain pain level less than 3 with increased activity tolerance  Assess pain Qshift and PRN.  PRN medications and non-pharm interventions as necessary.   Skin   Patient's skin is intact.  Scattered subcutaneous bruising to BUE.  Patient will maintain skin integrity and be free of infection or breakdown during inpatient rehab stay  Assess skin Qshift and PRN.  Encourage hydration, nutrition, skin care    Rehab Goals Patient on target to meet rehab goals: Yes Rehab Goals Revised: Patient on target with current goals *See Care Plan  and progress notes for long and short-term goals.     Barriers to Discharge  Current Status/Progress Possible Resolutions Date Resolved   Nursing                  PT  Medical stability;Home environment access/layout;Other (comments)  dense R hemi, aphaisa,  pusher              OT Decreased caregiver support;Inaccessible home environment;Lack of/limited family support  flaccid RUE, aphasia, pusher             SLP                Care Coordinator Home environment access/layout;Lack of/limited family support Family works during day, girlfriend willing to assist. 4 steps in total to enter home on target          Discharge Planning/Teaching Needs:  Goal to discharge home/cont. therapy  Will schedule education with family closer to d/c   Team Discussion:  Discussed discharge disposition and assistance available at home. Discussed effects of Ritalin to alertness/participation. Currently experiencing urinary retention and requires I+O cath q 6 hours. Is continent of bowel. Minimal assist goals set for discharge.   Revisions to Treatment Plan:  Have friend complete training for I+O caths/return demo for discharge is retention not resolved. SLP recommends written instructions/cues for patient.    Medical Summary Current Status: Impaired mobility and ADLs secondary to left MCA ischemic stroke Weekly Focus/Goal: Improve mobility, urinary retention, AKI, BP, PAF  Barriers to Discharge: Behavior;Incontinence;Medical stability   Possible Resolutions to Barriers: Therapies, optmize BP meds, follow labs, encourage fluids, optimize bladder meds   Continued Need for Acute Rehabilitation Level of Care: The patient requires daily medical management by a physician with specialized training in physical medicine and rehabilitation for the following reasons: Direction of a multidisciplinary physical rehabilitation program to maximize functional independence : Yes Medical management of patient stability for increased activity during participation in an intensive rehabilitation regime.: Yes Analysis of laboratory values and/or radiology reports with any subsequent need for medication adjustment and/or medical intervention. : Yes   I attest that I was present, lead  the team conference, and concur with the assessment and plan of the team.   Dorien Chihuahua B 02/10/2020, 2:40 PM

## 2020-02-11 ENCOUNTER — Inpatient Hospital Stay (HOSPITAL_COMMUNITY): Payer: BC Managed Care – PPO

## 2020-02-11 ENCOUNTER — Inpatient Hospital Stay (HOSPITAL_COMMUNITY): Payer: BC Managed Care – PPO | Admitting: Occupational Therapy

## 2020-02-11 ENCOUNTER — Inpatient Hospital Stay (HOSPITAL_COMMUNITY): Payer: BC Managed Care – PPO | Admitting: *Deleted

## 2020-02-11 ENCOUNTER — Inpatient Hospital Stay (HOSPITAL_COMMUNITY): Payer: BC Managed Care – PPO | Admitting: Speech Pathology

## 2020-02-11 DIAGNOSIS — Z8679 Personal history of other diseases of the circulatory system: Secondary | ICD-10-CM

## 2020-02-11 DIAGNOSIS — I952 Hypotension due to drugs: Secondary | ICD-10-CM

## 2020-02-11 DIAGNOSIS — R4701 Aphasia: Secondary | ICD-10-CM

## 2020-02-11 MED ORDER — IRBESARTAN 75 MG PO TABS
75.0000 mg | ORAL_TABLET | Freq: Every day | ORAL | Status: DC
  Administered 2020-02-12 – 2020-02-15 (×3): 75 mg via ORAL
  Filled 2020-02-11 (×5): qty 1

## 2020-02-11 NOTE — Progress Notes (Signed)
Recreational Therapy Session Note  Patient Details  Name: Dustin Schneider MRN: [id number] Date of Birth: [birth year] Today's Date: 02/11/2020  Pain: no c/o Skilled Therapeutic Interventions/Progress Updates: Attempted eval completion today at bedside, limited due to aphasia.  Full eval deferred at this time, will attempt eval completion during co-treat with PT next week per PT request.  Dannae Kato 02/11/2020, 12:33 PM

## 2020-02-11 NOTE — Progress Notes (Addendum)
Occupational Therapy Session Note  Patient Details  Name: Dustin Schneider MRN: [id number] Date of Birth: 18-Jun-1956  Today's Date: 02/11/2020 OT Individual Time: 0076-2263 OT Individual Time Calculation (min): 70 min    Short Term Goals: Week 2:  OT Short Term Goal 1 (Week 2): Pt will attend to RUE without verbal cues during functional transfers. OT Short Term Goal 2 (Week 2): Pt will don shirt, carrying over hemi dressing techniques without verbal cues. OT Short Term Goal 3 (Week 2): Pt complete last step of donning pants to pull pants over waist with modA. OT Short Term Goal 4 (Week 2): Pt will complete a functional dynamic sitting task for 2 minutes with no more than close supervision.  Skilled Therapeutic Interventions/Progress Updates:    Treatment session with focus on NMR targeting WB of RUE and RLE, trunk control, and functional transfers. Pt received seated in w/c, pleasant and cooperative. Denies pain, denies need to use the bathroom. Completed squat pivot transfers to mat and w/c with minA and facilitation at R knee to prevent extension. Completed PROM of LUE, pt WNL. Engaged in [place] seated EOM targeting WB of RUE at wrist, progressing to elbow. Incorporated functional reach across midline and laterally to facilitate increased trunk extension and weight shifting. Facilitated weight shifting at hip and trunk. Progressed to static standing at [facility] with minA and facilitation at RUE to support and facilitate WB, facilitation at knee to stabilize RLE, and facilitation at hips to facilitate balance reactions. Pt supported balance with LUE on table during activity. Increased challenge to engage in stepping with LLE to increase weight shift on to RLE, increasing balance assistance due to pt's delayed righting reactions. Attempted to facilitate muscle activation of LUE in supine. Pt demonstrated trace activation during the following movements: scapular retraction and elbow extension. Pt would benefit  from continued interventions targeting muscle activation of LUE. Ended session with pt seated in w/c with call bell within reach and alarm belt on.  Therapy Documentation Precautions:  Precautions Precautions: Fall Precaution Comments: pusher, dense R hemiparesis Restrictions Weight Bearing Restrictions: No  Therapy Vitals Temp: 97.7 F (36.5 C) Pulse Rate: 92 Resp: 18 BP: (!) 115/105 Patient Position (if appropriate): Sitting Oxygen Therapy SpO2: 97 % O2 Device: Room Air Pain:  No pain reported.  Therapy/Group: Individual Therapy  Sarah Rhymer  Elisabeth Most - present for session, reviewed and agree 02/11/2020, 3:29 PM

## 2020-02-11 NOTE — Progress Notes (Signed)
Speech Language Pathology Daily Session Note  Patient Details  Name: Dustin Schneider MRN: [id number] Date of Birth: [birth year]  Today's Date: 02/11/2020 SLP Individual Time: 0831-0927 SLP Individual Time Calculation (min): 56 min  Short Term Goals: Week 2: SLP Short Term Goal 1 (Week 2): Pt will consume current diet with minimal overt s/sx aspiration, efficient mastication and oral clearance Mod I for use of strategies. SLP Short Term Goal 2 (Week 2): Pt will consume trials of thin liquids with minimal overt s/sx aspiration over 3 sessions prior to repeat MBSS. SLP Short Term Goal 3 (Week 2): Pt will consume trials of dysphagia 3 (mech soft) solids demonstrating efficient mastication and oral clearance X2 prior to advancement. SLP Short Term Goal 4 (Week 2): Pt will use multimodal means of communication to express basic wants and needs with Mod A multimodal cues. SLP Short Term Goal 5 (Week 2): Pt will verbalize at the word level with Mod A multimodal cues. SLP Short Term Goal 6 (Week 2): Pt will detect functional and/or verbal errors with Mod A multimodal cues.  Skilled Therapeutic Interventions: Pt was seen for skilled ST targeting dysphagia and cognitive-linguistic goals. Upon arrival, pt had not eaten breakfast. He consumed Dys 2 (minced/fine chop) breakfast tray with overall Supervision A level verbal cues for slower rate/bite size control. Mastication was efficient and oral clearance achieved. No overt s/sx aspiration noted across solid textures or nectar thick liquids. During trial of upgraded Dys 3 (mech soft) solids, pt's mastication and oral clearance remained efficient. Recommend upgrade to Dysphagia 3 textures, continue nectar thick liquids.  SLP further facilitated session with various sorting tasks. Pt sorted picture tiles by discrete category Mod I. When language increased in complexity (I.e. sorting by object function or location), pt required Min A verbal and visual cues. During  basic money management task, he sorted coins by type Mod I. Pt required Mod A verbal and visual cues for error awareness and problem solving to display set amounts of change, Total A for calculating and displaying change. Pt left laying in bed with alarm set and needs within reach. Continue per current plan of care.        Pain Pain Assessment Pain Scale: 0-10 Pain Score: 0-No pain  Therapy/Group: Individual Therapy  Arbutus Leas 02/11/2020, 7:05 AM

## 2020-02-11 NOTE — Plan of Care (Signed)
  Problem: RH BOWEL ELIMINATION Goal: RH STG MANAGE BOWEL WITH ASSISTANCE Description: STG Manage Bowel with min Assistance. Outcome: Progressing Goal: RH STG MANAGE BOWEL W/MEDICATION W/ASSISTANCE Description: STG Manage Bowel with Medication mod I with Assistance. Outcome: Progressing   Problem: RH BLADDER ELIMINATION Goal: RH STG MANAGE BLADDER WITH ASSISTANCE Description: STG Manage Bladder With min Assistance Outcome: Progressing

## 2020-02-11 NOTE — Progress Notes (Signed)
[place] PHYSICAL MEDICINE & REHABILITATION PROGRESS NOTE  Subjective/Complaints: Patient seen laying in bed this morning.  He indicates he slept well overnight.  He indicates that his orthosis was not donned again overnight, discussed with nursing again.  He was seen by neuropsych yesterday, notes reviewed-intact receptive skills.  ROS: limited by aphasia, but appears to deny CP, shortness of breath, nausea, vomiting and diarrhea.  Objective: Vital Signs: Blood pressure 102/77, pulse 84, temperature 98.7 F (37.1 C), temperature source Oral, resp. rate 17, height 5\' 9"  (1.753 m), weight 79.8 kg, SpO2 96 %. No results found. Recent Labs    02/09/20 0619  WBC 10.5  HGB 13.4  HCT 41.2  PLT 343   No results for input(s): NA, K, CL, CO2, GLUCOSE, BUN, CREATININE, CALCIUM in the last 72 hours.  Physical Exam: BP 102/77 (BP Location: Left Arm)   Pulse 84   Temp 98.7 F (37.1 C) (Oral)   Resp 17   Ht 5\' 9"  (1.753 m)   Wt 79.8 kg   SpO2 96%   BMI 25.98 kg/m  Constitutional: No distress . Vital signs reviewed. HENT: Normocephalic.  Atraumatic. Eyes: EOMI. No discharge. Cardiovascular: No JVD. Respiratory: Normal effort.  No stridor. GI: Non-distended. Skin: Warm and dry.  Intact. Psych: Flat, appears somewhat frustrated Musc: No edema in extremities.  No tenderness in extremities. Neuro: Alert Expressive >receptive aphasia, slowly improving Motor: Appears to be 5/5 on left side and 0/5 on right side, no change No increase in tone appreciated, no change  Assessment/Plan: 1. Functional deficits secondary to left MCA infarct which require 3+ hours per day of interdisciplinary therapy in a comprehensive inpatient rehab setting.  Physiatrist is providing close team supervision and 24 hour management of active medical problems listed below.  Physiatrist and rehab team continue to assess barriers to discharge/monitor patient progress toward functional and medical goals  Care  Tool:  Bathing    Body parts bathed by patient: Right arm, Chest, Abdomen, Front perineal area, Left upper leg, Face, Right upper leg   Body parts bathed by helper: Left arm     Bathing assist Assist Level: Moderate Assistance - Patient 50 - 74%     Upper Body Dressing/Undressing Upper body dressing   What is the patient wearing?: Pull over shirt    Upper body assist Assist Level: Minimal Assistance - Patient > 75%    Lower Body Dressing/Undressing Lower body dressing      What is the patient wearing?: Pants     Lower body assist Assist for lower body dressing: Maximal Assistance - Patient 25 - 49%     Toileting Toileting    Toileting assist Assist for toileting: 2 Helpers     Transfers Chair/bed transfer  Transfers assist  Chair/bed transfer activity did not occur: Safety/medical concerns  Chair/bed transfer assist level: Moderate Assistance - Patient 50 - 74%     Locomotion Ambulation   Ambulation assist      Assist level: 2 helpers Assistive device: Other (comment) (L handrail) Max distance: 81ft   Walk 10 feet activity   Assist  Walk 10 feet activity did not occur: Safety/medical concerns  Assist level: 2 helpers Assistive device: Other (comment) (L handrail)   Walk 50 feet activity   Assist Walk 50 feet with 2 turns activity did not occur: Safety/medical concerns         Walk 150 feet activity   Assist Walk 150 feet activity did not occur: Safety/medical concerns (R hemi, LE  weakness, decreased balance/postural control)         Walk 10 feet on uneven surface  activity   Assist Walk 10 feet on uneven surfaces activity did not occur: Safety/medical concerns (R hemi, LE weakness, decreased balance/postural control)         Wheelchair     Assist Will patient use wheelchair at discharge?: Yes Type of Wheelchair: Manual    Wheelchair assist level: Dependent - Patient 0%      Wheelchair 50 feet with 2 turns  activity    Assist        Assist Level: Dependent - Patient 0%   Wheelchair 150 feet activity     Assist     Assist Level: Dependent - Patient 0%      Medical Problem List and Plan: 1. Right flaccid hemiplegia, impaired mobility and ADLs secondary to left MCA ischemic stroke  Continue CIR  WHO/PRAFO nightly, discussed with nursing again 2.  Antithrombotics: -DVT/anticoagulation:  Pharmaceutical: Other (comment)--Eliquis             -antiplatelet therapy: N/A 3. Pain Management: Tylenol prn.  4. Mood: LCSW to follow for evaluation and support.              -antipsychotic agents: N/A 5. Neuropsych: This patient is not capable of making decisions on his own behalf.  Trial of amantadine started on 6/26, discussed with therapies, DC'd on 7/1 6. Skin/Wound Care: N/A 7. Fluids/Electrolytes/Nutrition: Monitor I/Os. 8. PAF: Monitor HR tid--has been off Tikosyn since admission. Continue metoprolol bid with Eliquis.   Per cardiology, continue to monitor-no need for Tikosyn at present  Rate controlled on 7/1 9. HTN: Monitor BP--managed on metoprolol   Avapro decreased to 75 on 7/1.    Soft on 7/1  Monitor with increased mobility 10. Dyslipidemia: Continue Crestor. 11.  Post stroke dysphagia: D3, nectar liquids with chin tuck.   Hold tube feeds and start calorie count.   NG DC'd  Advance diet as tolerated  12. Impaired fasting glucose: Likely secondary to tube feeds  Elevated on 6/25, labs ordered for tomorrow  Continue to monitor 13. Nonischemic cardiomyopathy: outpatient cardiology follow-up. 14.  Hypoalbuminemia  Supplement initiated on 6/23 15.  Leukocytosis: Resolved  WBCs 10.5 on 6/29  Afebrile  Continue to monitor 16.  AKI:   Creatinine 0.89 on 6/28 after fluid bolus, labs ordered for tomorrow  Encourage fluids  Continue to monitor 17. Constipation  Improving 18.  Urinary retention   Flomax started on 6/30, plan increase tomorrow  LOS: 9 days A FACE TO  FACE EVALUATION WAS PERFORMED  Dustin Schneider [name] 02/11/2020, 8:56 AM

## 2020-02-11 NOTE — Progress Notes (Signed)
Physical Therapy Session Note  Patient Details  Name: Dustin Schneider MRN: [id number] Date of Birth: 08-29-1955  Today's Date: 02/11/2020 PT Individual Time: 1100-1157 PT Individual Time Calculation (min): 57 min   Short Term Goals: Week 1:  PT Short Term Goal 1 (Week 1): Pt will transfer supine<>sitting EOB with mod A PT Short Term Goal 1 - Progress (Week 1): Met PT Short Term Goal 2 (Week 1): Pt will transfer bed<>chair with LRAD mod A PT Short Term Goal 2 - Progress (Week 1): Met PT Short Term Goal 3 (Week 1): Pt will perform simulated car transfer with LRAD max A PT Short Term Goal 3 - Progress (Week 1): Met Week 2:  PT Short Term Goal 1 (Week 2): Pt will perform bed mobility with min A PT Short Term Goal 2 (Week 2): Pt will transfer bed<>chair with LRAD min A PT Short Term Goal 3 (Week 2): Pt will ambulate 66f with LRAD max A of 1  Skilled Therapeutic Interventions/Progress Updates:   Received pt supine in bed, pt agreeable to therapy, and denied any pain during session. Session with emphasis on functional mobility/transfers, dynamic sitting balance/trunk control, midline orientation, NMR, motor control, and improved activity tolerance. Therapist located manual WC, Roho cushion, and R lap tray and pt transferred supine<>sitting EOB with mod A for R LE management. Noted swelling in R LE and donned ted hose total A. Donned R AFO and shoes max/total A for time management purposes and pt transferred bed<>WC mod A with cues to clear buttocks. Pt performed WC mobility 1069fwith min A using R hemi technique and increased time with max verbal and tactile cues for sequencing and technique. Pt transported remainder of way to therapy gym total A and transferred WC<>mat squat<>pivot mod A with manual facilitation to place R foot and sit<>supine mod A for R UE/LE management. Pt worked on hip extension/abduction, glute activation, and knee control in supine on mat. Pt performed bridges 1x5 and 2x10 with  therapist maintaining hooklying position of R LE with cues for technique. Worked on R hip flexor activation by placing pt in hooklying position, maintaining isometric contraction, and then pushing into R hip abduction towards target of therapist's hand. Pt with mild activation of hip flexors and abductors however with poor eccentric control of R LE. Pt transferred supine<>sitting on mat with mod A for R UE/LE management and cues for technique. Squat<>pivot mat<>WC mod A. [facility]. Concluded session with pt sitting in WC, needs within reach, and seatbelt alarm on. R UE supported on lap tray.   Therapy Documentation Precautions:  Precautions Precautions: Fall Precaution Comments: pusher, dense R hemiparesis Restrictions Weight Bearing Restrictions: No  Therapy/Group: Individual Therapy AnAlfonse AlpersT, DPT   02/11/2020, 7:32 AM

## 2020-02-12 ENCOUNTER — Inpatient Hospital Stay (HOSPITAL_COMMUNITY): Payer: BC Managed Care – PPO | Admitting: Occupational Therapy

## 2020-02-12 ENCOUNTER — Inpatient Hospital Stay (HOSPITAL_COMMUNITY): Payer: BC Managed Care – PPO | Admitting: Speech Pathology

## 2020-02-12 ENCOUNTER — Inpatient Hospital Stay (HOSPITAL_COMMUNITY): Payer: BC Managed Care – PPO

## 2020-02-12 LAB — BASIC METABOLIC PANEL
Anion gap: 10 (ref 5–15)
BUN: 25 mg/dL — ABNORMAL HIGH (ref 8–23)
CO2: 25 mmol/L (ref 22–32)
Calcium: 8.8 mg/dL — ABNORMAL LOW (ref 8.9–10.3)
Chloride: 105 mmol/L (ref 98–111)
Creatinine, Ser: 0.97 mg/dL (ref 0.61–1.24)
GFR calc Af Amer: >60 mL/min (ref >60)
GFR calc non Af Amer: >60 mL/min (ref >60)
Glucose, Bld: 127 mg/dL — ABNORMAL HIGH (ref 70–99)
Potassium: 4.2 mmol/L (ref 3.5–5.1)
Sodium: 140 mmol/L (ref 135–145)

## 2020-02-12 NOTE — Progress Notes (Signed)
Speech Language Pathology Daily Session Note  Patient Details  Name: Dustin Schneider MRN: [id number] Date of Birth: 12-Apr-1956  Today's Date: 02/12/2020 SLP Individual Time: 0830-0925 SLP Individual Time Calculation (min): 55 min  Short Term Goals: Week 2: SLP Short Term Goal 1 (Week 2): Pt will consume current diet with minimal overt s/sx aspiration, efficient mastication and oral clearance Mod I for use of strategies. SLP Short Term Goal 2 (Week 2): Pt will consume trials of thin liquids with minimal overt s/sx aspiration over 3 sessions prior to repeat MBSS. SLP Short Term Goal 3 (Week 2): Pt will consume trials of dysphagia 3 (mech soft) solids demonstrating efficient mastication and oral clearance X2 prior to advancement. SLP Short Term Goal 4 (Week 2): Pt will use multimodal means of communication to express basic wants and needs with Mod A multimodal cues. SLP Short Term Goal 5 (Week 2): Pt will verbalize at the word level with Mod A multimodal cues. SLP Short Term Goal 6 (Week 2): Pt will detect functional and/or verbal errors with Mod A multimodal cues.  Skilled Therapeutic Interventions: Pt was seen for skilled ST targeting dysphagia and communication goals. SLP provided skilled observation of pt consuming recently upgrade dysphagia 3 texture breakfast tray with nectar liquids. Pt with efficient mastication and oral clearance of all solids and Mod I for use of swallow precautions. No overt s/sx aspiration observed across solids or liquids. Recommend continue current diet.  During session pt independently used word communication board to express need for bathroom. SLP also provided choices in yes/no format to decipher whether pt needed to urinate or have a BM. Pt was continent of bowel.  Speech tasks targeted familiar personally meaningful words and short phrases to maximize verbal output. Pt produced names of 2/4 birds from his [place] bird book with visual/written cue and Mod A phonemic cues.  He verbalized his full name and girlfriend's name with only a written cue and sound of first phoneme provided. Pt with improved ability to verbalize "I am an artist" and "I am 63" with written and Mod A phonemic cues today in comparison to previous sessions. Speech is becoming slightly more fluent with cues. Pt left sitting in chair with seatbelt alarm set and needs within reach. Continue per current plan of care.          Pain Pain Assessment Pain Scale: 0-10 Pain Score: 0-No pain  Therapy/Group: Individual Therapy  Arbutus Leas 02/12/2020, 7:08 AM

## 2020-02-12 NOTE — Progress Notes (Signed)
Speech Language Pathology Daily Session Note  Patient Details  Name: Dustin Schneider MRN: [id number] Date of Birth: [birth year]  Today's Date: 02/12/2020 SLP Individual Time: 1259-1331 SLP Individual Time Calculation (min): 32 min  Short Term Goals: Week 2: SLP Short Term Goal 1 (Week 2): Pt will consume current diet with minimal overt s/sx aspiration, efficient mastication and oral clearance Mod I for use of strategies. SLP Short Term Goal 2 (Week 2): Pt will consume trials of thin liquids with minimal overt s/sx aspiration over 3 sessions prior to repeat MBSS. SLP Short Term Goal 3 (Week 2): Pt will consume trials of dysphagia 3 (mech soft) solids demonstrating efficient mastication and oral clearance X2 prior to advancement. SLP Short Term Goal 4 (Week 2): Pt will use multimodal means of communication to express basic wants and needs with Mod A multimodal cues. SLP Short Term Goal 5 (Week 2): Pt will verbalize at the word level with Mod A multimodal cues. SLP Short Term Goal 6 (Week 2): Pt will detect functional and/or verbal errors with Mod A multimodal cues.  Skilled Therapeutic Interventions: Pt was seen for skilled ST targeting dysphagia. SLP facilitated session with trials of upgraded thin H2O after pt completed oral care with set up assist at sink. Pt consumed 10/10 ice chips without overt s/sx aspiration. During cup sips of total of ~6oz thin H2O, pt exhibited immediate throat clear X1 on first sip, wet vocal quality X1, and 1 delayed cough. Recommend continue current diet, however pt is demonstrating good potential to repeat MBSS early next week. Pt left sitting in chair with alarm set and needs within reach. Continue per current plan of care.        Pain Pain Assessment Pain Scale: Faces Faces Pain Scale: No hurt  Therapy/Group: Individual Therapy  Arbutus Leas 02/12/2020, 7:09 AM

## 2020-02-12 NOTE — Plan of Care (Signed)
  Problem: Consults Goal: RH STROKE PATIENT EDUCATION Description: See Patient Education module for education specifics  Outcome: Progressing   Problem: RH BOWEL ELIMINATION Goal: RH STG MANAGE BOWEL WITH ASSISTANCE Description: STG Manage Bowel with min Assistance. Outcome: Progressing Goal: RH STG MANAGE BOWEL W/MEDICATION W/ASSISTANCE Description: STG Manage Bowel with Medication mod I with Assistance. Outcome: Progressing   Problem: RH SKIN INTEGRITY Goal: RH STG SKIN FREE OF INFECTION/BREAKDOWN Description: Skin will remain free from injury with min assist Outcome: Progressing Goal: RH STG MAINTAIN SKIN INTEGRITY WITH ASSISTANCE Description: STG Maintain Skin Integrity With min Assistance. Outcome: Progressing

## 2020-02-12 NOTE — Progress Notes (Signed)
Occupational Therapy Session Note  Patient Details  Name: Dustin Schneider MRN: [id number] Date of Birth: 29-Jun-1956  Today's Date: 02/12/2020 OT Individual Time: 0730-0830 OT Individual Time Calculation (min): 60 min    Short Term Goals: Week 2:  OT Short Term Goal 1 (Week 2): Pt will attend to RUE without verbal cues during functional transfers. OT Short Term Goal 2 (Week 2): Pt will don shirt, carrying over hemi dressing techniques without verbal cues. OT Short Term Goal 3 (Week 2): Pt complete last step of donning pants to pull pants over waist with modA. OT Short Term Goal 4 (Week 2): Pt will complete a functional dynamic sitting task for 2 minutes with no more than close supervision.  Skilled Therapeutic Interventions/Progress Updates:    Treatment session with focus on functional transfers and NMR targeting WB through RUE and increased lateral trunk extension to increase muscle activation and postural control for ADL tasks, and weight shift to increase buttocks clearance during squat pivot transfers. Pt received semi-reclined in bed reporting poor sleep and no pain. Completed sit to supine with modA to advance RLE over EOB with pt able to advance LLE. Pt donned shoes and AFO with modA and use of figure 4 and washcloth to don, with pt requiring assistance to tie shoes, assist with AFO, and stabilize RLE. Pt would benefit from education on one handed shoe tying techniques and AE to support RLE while donning socks and shoes. Completed squat pivot transfer to w/c and to mat with min-modA with multimodal facilitation of anterior weight shift and assistance to position RLE and clear buttocks. Completed functional reach activity with LUE laterally and anteriorly targeting trunk extension and WB at wrist and through arm of RUE. Facilitated at the elbow to increase WB. Progressed to completing squat to stands with weight shifts to increase WB through RLE and incorporate weight shift to improve righting  reactions, postural control, and WB through RLE for toileting and perineal hygiene. Increased challenge to completing sit to stand with weight shifting activity to increase dynamic standing balance for LB dressing, facilitating at the knee and thigh to increase WB through RLE. Educated pt on attending to RUE to increase proprioception and R attention, and on functional applications of intervention for toileting and transfers. Pt demonstrated carryover by increasing buttocks clearance during squat pivot. Ended session with pt seated in w/c with speech therapist present and call bell within reach.   Therapy Documentation Precautions:  Precautions Precautions: Fall Precaution Comments: pusher, dense R hemiparesis Restrictions Weight Bearing Restrictions: No Vital Signs: Therapy Vitals Temp: 98.3 F (36.8 C) Pulse Rate: (!) 103 Resp: 15 BP: 110/76 Patient Position (if appropriate): Lying Oxygen Therapy SpO2: 96 % O2 Device: Room Air Pain:  Reported no pain  Therapy/Group: Individual Therapy  Michelle Nasuti 02/12/2020, 8:34 AM

## 2020-02-12 NOTE — Progress Notes (Signed)
[place] PHYSICAL MEDICINE & REHABILITATION PROGRESS NOTE  Subjective/Complaints: Patient seen sitting up, working with therapies this AM.  He states he did not sleep well overnight, but no particular reason.  He did admit to napping during the day yesterday. He did wear his orthosis overnight.   ROS: Denies CP, shortness of breath, nausea, vomiting and diarrhea.  Objective: Vital Signs: Blood pressure 110/76, pulse (!) 103, temperature 98.3 F (36.8 C), resp. rate 15, height 5\' 9"  (1.753 m), weight 79.8 kg, SpO2 96 %. No results found. No results for input(s): WBC, HGB, HCT, PLT in the last 72 hours. Recent Labs    02/12/20 0555  NA 140  K 4.2  CL 105  CO2 25  GLUCOSE 127*  BUN 25*  CREATININE 0.97  CALCIUM 8.8*    Physical Exam: BP 110/76 (BP Location: Left Arm)   Pulse (!) 103   Temp 98.3 F (36.8 C)   Resp 15   Ht 5\' 9"  (1.753 m)   Wt 79.8 kg   SpO2 96%   BMI 25.98 kg/m  Constitutional: No distress . Vital signs reviewed. HENT: Normocephalic.  Atraumatic. Eyes: EOMI. No discharge. Cardiovascular: No JVD.  Regular rhythm. Respiratory: Normal effort.  No stridor.  Bilaterally clear to auscultation GI: Non-distended.  BS +. Skin: Warm and dry.  Intact. Psych: Flat. Neuro: Alert Expressive >receptive aphasia, slowly improving Motor: Appears to be 5/5 on left side and 0/5 on right side, unchanged No increase in tone appreciated, no change  Assessment/Plan: 1. Functional deficits secondary to left MCA infarct which require 3+ hours per day of interdisciplinary therapy in a comprehensive inpatient rehab setting.  Physiatrist is providing close team supervision and 24 hour management of active medical problems listed below.  Physiatrist and rehab team continue to assess barriers to discharge/monitor patient progress toward functional and medical goals  Care Tool:  Bathing    Body parts bathed by patient: Right arm, Chest, Abdomen, Front perineal area, Left  upper leg, Face, Right upper leg   Body parts bathed by helper: Left arm     Bathing assist Assist Level: Moderate Assistance - Patient 50 - 74%     Upper Body Dressing/Undressing Upper body dressing   What is the patient wearing?: Pull over shirt    Upper body assist Assist Level: Minimal Assistance - Patient > 75%    Lower Body Dressing/Undressing Lower body dressing      What is the patient wearing?: Pants     Lower body assist Assist for lower body dressing: Maximal Assistance - Patient 25 - 49%     Toileting Toileting    Toileting assist Assist for toileting: 2 Helpers     Transfers Chair/bed transfer  Transfers assist  Chair/bed transfer activity did not occur: Safety/medical concerns  Chair/bed transfer assist level: Moderate Assistance - Patient 50 - 74%     Locomotion Ambulation   Ambulation assist      Assist level: 2 helpers Assistive device: Other (comment) (L handrail) Max distance: 65ft   Walk 10 feet activity   Assist  Walk 10 feet activity did not occur: Safety/medical concerns  Assist level: 2 helpers Assistive device: Other (comment) (L handrail)   Walk 50 feet activity   Assist Walk 50 feet with 2 turns activity did not occur: Safety/medical concerns         Walk 150 feet activity   Assist Walk 150 feet activity did not occur: Safety/medical concerns (R hemi, LE weakness, decreased balance/postural control)  Walk 10 feet on uneven surface  activity   Assist Walk 10 feet on uneven surfaces activity did not occur: Safety/medical concerns (R hemi, LE weakness, decreased balance/postural control)         Wheelchair     Assist Will patient use wheelchair at discharge?: Yes Type of Wheelchair: Manual    Wheelchair assist level: Minimal Assistance - Patient > 75% Max wheelchair distance: 167ft    Wheelchair 50 feet with 2 turns activity    Assist        Assist Level: Minimal Assistance -  Patient > 75%   Wheelchair 150 feet activity     Assist     Assist Level: Dependent - Patient 0%      Medical Problem List and Plan: 1. Right flaccid hemiplegia, impaired mobility and ADLs secondary to left MCA ischemic stroke  Continue CIR  WHO/PRAFO nightly 2.  Antithrombotics: -DVT/anticoagulation:  Pharmaceutical: Other (comment)--Eliquis             -antiplatelet therapy: N/A 3. Pain Management: Tylenol prn.  4. Mood: LCSW to follow for evaluation and support.              -antipsychotic agents: N/A 5. Neuropsych: This patient is not capable of making decisions on his own behalf.  Trial of amantadine started on 6/26, discussed with therapies, DC'd on 7/1 6. Skin/Wound Care: N/A 7. Fluids/Electrolytes/Nutrition: Monitor I/Os. 8. PAF: Monitor HR tid--has been off Tikosyn since admission. Continue metoprolol bid with Eliquis.   Per cardiology, continue to monitor-no need for Tikosyn at present  Elevated HR this AM, monitor for trend 9. HTN: Monitor BP--managed on metoprolol   Avapro decreased to 75 on 7/1.    Soft on 7/2, consider further medication adjustments if necessary  Monitor with increased mobility 10. Dyslipidemia: Continue Crestor. 11.  Post stroke dysphagia: D3, nectar liquids with chin tuck.   Hold tube feeds and start calorie count.   NG DC'd  Advance diet as tolerated  12. Impaired fasting glucose: Likely stress induced  Elevated on 7/2  Continue to monitor 13. Nonischemic cardiomyopathy: outpatient cardiology follow-up. 14.  Hypoalbuminemia  Supplement initiated on 6/23 15.  Leukocytosis: Resolved  WBCs 10.5 on 6/29  Afebrile  Continue to monitor 16.  AKI:   Creatinine 0.97 on 7/2, labs ordered for Monday  Encourage fluids  Continue to monitor 17. Constipation  Improving 18.  Urinary retention   Flomax started on 6/30  Improving on 7/2  LOS: 10 days A FACE TO FACE EVALUATION WAS PERFORMED  Arriyah Madej [name] 02/12/2020, 9:28 AM

## 2020-02-12 NOTE — Progress Notes (Signed)
Occupational Therapy Session Note  Patient Details  Name: Dustin Schneider MRN: [id number] Date of Birth: 28-Mar-1956  Today's Date: 02/12/2020 OT Individual Time: 1504-1364 OT Individual Time Calculation (min): 41 min    Short Term Goals: Week 1:  OT Short Term Goal 1 (Week 1): Pt will locate all ADL items on R of sink/table with no VC OT Short Term Goal 1 - Progress (Week 1): Met OT Short Term Goal 2 (Week 1): Pt will wash RUE during UB bathing wiht no VC to demo improved R inattention OT Short Term Goal 2 - Progress (Week 1): Met OT Short Term Goal 3 (Week 1): Pt will complete 1/4 steps of UB dressing OT Short Term Goal 3 - Progress (Week 1): Met OT Short Term Goal 4 (Week 1): Pt will sit EOB/EOM statically with no more than MIN A to maintain balance for 3 min OT Short Term Goal 4 - Progress (Week 1): Met  Skilled Therapeutic Interventions/Progress Updates:    1:1. Pt received in w/c agreeable to OT. Pt completes w/c propulsion with min VC for steering strategy in beginning, however able to carry over. Pt stands at high low table for 15 min with R knee extension/VC for weight shifting L in stance while playing game of checkers with vertical board positioned to R to encourage R attention. RUE placed on table with forearm and wrist on table for weight bearing during weight shifting reaching R to rech for checkers. OT applies Saebo stim to deltoid on anterior/posterior aspect of shoulder to activate shoulder to decrease risk of sublux. Skin in tact after 15 min attended/45 min unattended stim. Seated towel glides in all directions completed for WB with scap mob provided. Exited session with pt seated in w/c, exit alarm on and call light in reach  Saebo Stim One 330 pulse width 35 Hz pulse rate On 8 sec/ off 8 sec Ramp up/ down 2 sec Symmetrical Biphasic wave form  Max intensity 180m at 500 Ohm load   Therapy Documentation Precautions:  Precautions Precautions: Fall Precaution  Comments: pusher, dense R hemiparesis Restrictions Weight Bearing Restrictions: No General:   Vital Signs:  Pain: Pain Assessment Pain Scale: 0-10 Pain Score: 0-No pain ADL: ADL Eating: Moderate cueing, Supervision/safety Grooming: Minimal assistance Where Assessed-Grooming: Sitting at sink Upper Body Bathing: Minimal assistance Where Assessed-Upper Body Bathing: Bed level Lower Body Bathing: Maximal assistance Where Assessed-Lower Body Bathing: Bed level Upper Body Dressing: Maximal assistance Where Assessed-Upper Body Dressing: Bed level Lower Body Dressing: Maximal assistance Where Assessed-Lower Body Dressing: Bed level Toilet Transfer:  (MOD A STS in stedy) Vision   Perception    Praxis   Exercises:   Other Treatments:     Therapy/Group: Individual Therapy  STonny Branch7/09/2019, 12:01 PM

## 2020-02-12 NOTE — Progress Notes (Signed)
Physical Therapy Session Note  Patient Details  Name: Dustin Schneider MRN: [id number] Date of Birth: Dec 06, 1955  Today's Date: 02/12/2020 PT Individual Time: 1445-1526 PT Individual Time Calculation (min): 41 min   Short Term Goals: Week 1:  PT Short Term Goal 1 (Week 1): Pt will transfer supine<>sitting EOB with mod A PT Short Term Goal 1 - Progress (Week 1): Met PT Short Term Goal 2 (Week 1): Pt will transfer bed<>chair with LRAD mod A PT Short Term Goal 2 - Progress (Week 1): Met PT Short Term Goal 3 (Week 1): Pt will perform simulated car transfer with LRAD max A PT Short Term Goal 3 - Progress (Week 1): Met Week 2:  PT Short Term Goal 1 (Week 2): Pt will perform bed mobility with min A PT Short Term Goal 2 (Week 2): Pt will transfer bed<>chair with LRAD min A PT Short Term Goal 3 (Week 2): Pt will ambulate 43f with LRAD max A of 1  Skilled Therapeutic Interventions/Progress Updates:   Received pt sitting in WC, pt agreeable to therapy, and denied any pain during session. Session with emphasis on functional mobility/transfers, generalized strengthening, dynamic standing balance/coordination, NMR, motor control/sequencing, midline orientation, gait training, and improved activity tolerance. R AFO donned throughout session. [facility] for time [facility] purposes. Pt ambulated 235fx 2 trials max A +2 using L handrail. Trial 1: R UE supported around therapist while sitting on stool and pt required total A to advance R LE, control  R knee hyperextension/buckling, and for weight shifting to L to clear R LE. Added stocking to R LE to assist with foot clearance. Trial 2: R UE supported around therapist while standing and towel tied around pt's R thigh to assist therapist with advancing R LE. Pt required verbal and tactile cues for weight shifting, upright posture, and R LE positioning all while using mirror for visual feedback. Pt transferred sit<>stand<>tall  kneeling mod A +2. In tall kneeling worked on hip extension and glute activation with cues to "touch buttocks to heels" 2x8 reps with mod A of 1. Pt resting on bilateral elbows in between sets to promote weight bearing through R UE. Pt transferred tall kneeling<>L side sitting min A +2 and squat<>pivot mat<>WC mod A of 1. [facility] and requested to return to bed. Squat<>pivot WC<>bed mod A, doffed shoes and R AFO total A, and sit<>supine mod A for R LE management. Concluded session with pt supine in bed, needs within reach, and bed alarm on. Therapist supported hemiplegic UE on pillow.   Therapy Documentation Precautions:  Precautions Precautions: Fall Precaution Comments: pusher, dense R hemiparesis Restrictions Weight Bearing Restrictions: No  Therapy/Group: Individual Therapy AnAlfonse AlpersT, DPT   02/12/2020, 7:42 AM

## 2020-02-13 ENCOUNTER — Inpatient Hospital Stay (HOSPITAL_COMMUNITY): Payer: BC Managed Care – PPO | Admitting: Physical Therapy

## 2020-02-13 MED ORDER — METOPROLOL TARTRATE 25 MG/10 ML ORAL SUSPENSION
50.0000 mg | Freq: Two times a day (BID) | ORAL | Status: DC
  Administered 2020-02-13 – 2020-02-17 (×6): 50 mg via ORAL
  Filled 2020-02-13 (×10): qty 20

## 2020-02-13 MED ORDER — TIZANIDINE HCL 2 MG PO TABS
2.0000 mg | ORAL_TABLET | Freq: Three times a day (TID) | ORAL | Status: DC
  Administered 2020-02-13 – 2020-03-02 (×54): 2 mg via ORAL
  Filled 2020-02-13 (×53): qty 1

## 2020-02-13 NOTE — Progress Notes (Signed)
[place] PHYSICAL MEDICINE & REHABILITATION PROGRESS NOTE  Subjective/Complaints:  Per PT increased tone Right knee flexors   ROS: Denies CP, shortness of breath, nausea, vomiting and diarrhea.  Objective: Vital Signs: Blood pressure 97/64, pulse 76, temperature 98.9 F (37.2 C), temperature source Oral, resp. rate 16, height 5\' 9"  (1.753 m), weight 79.8 kg, SpO2 99 %. No results found. No results for input(s): WBC, HGB, HCT, PLT in the last 72 hours. Recent Labs    02/12/20 0555  NA 140  K 4.2  CL 105  CO2 25  GLUCOSE 127*  BUN 25*  CREATININE 0.97  CALCIUM 8.8*    Physical Exam: BP 97/64   Pulse 76   Temp 98.9 F (37.2 C) (Oral)   Resp 16   Ht 5\' 9"  (1.753 m)   Wt 79.8 kg   SpO2 99%   BMI 25.98 kg/m   General: No acute distress Mood and affect are appropriate Heart: Regular rate and rhythm no rubs murmurs or extra sounds Lungs: Clear to auscultation, breathing unlabored, no rales or wheezes Abdomen: Positive bowel sounds, soft nontender to palpation, nondistended Extremities: No clubbing, cyanosis, or edema Skin: No evidence of breakdown, no evidence of rash   Motor: Appears to be 5/5 on left side and 0/5 on right side, unchanged No increase in tone appreciated, no change  Assessment/Plan: 1. Functional deficits secondary to left MCA infarct which require 3+ hours per day of interdisciplinary therapy in a comprehensive inpatient rehab setting.  Physiatrist is providing close team supervision and 24 hour management of active medical problems listed below.  Physiatrist and rehab team continue to assess barriers to discharge/monitor patient progress toward functional and medical goals  Care Tool:  Bathing    Body parts bathed by patient: Right arm, Chest, Abdomen, Front perineal area, Left upper leg, Face, Right upper leg   Body parts bathed by helper: Left arm     Bathing assist Assist Level: Moderate Assistance - Patient 50 - 74%     Upper Body  Dressing/Undressing Upper body dressing   What is the patient wearing?: Pull over shirt    Upper body assist Assist Level: Minimal Assistance - Patient > 75%    Lower Body Dressing/Undressing Lower body dressing      What is the patient wearing?: Pants     Lower body assist Assist for lower body dressing: Maximal Assistance - Patient 25 - 49%     Toileting Toileting    Toileting assist Assist for toileting: 2 Helpers     Transfers Chair/bed transfer  Transfers assist  Chair/bed transfer activity did not occur: Safety/medical concerns  Chair/bed transfer assist level: Moderate Assistance - Patient 50 - 74%     Locomotion Ambulation   Ambulation assist      Assist level: 2 helpers Assistive device: Other (comment) (L handrail) Max distance: 14ft   Walk 10 feet activity   Assist  Walk 10 feet activity did not occur: Safety/medical concerns  Assist level: 2 helpers Assistive device: Other (comment) (L handrail)   Walk 50 feet activity   Assist Walk 50 feet with 2 turns activity did not occur: Safety/medical concerns         Walk 150 feet activity   Assist Walk 150 feet activity did not occur: Safety/medical concerns (R hemi, LE weakness, decreased balance/postural control)         Walk 10 feet on uneven surface  activity   Assist Walk 10 feet on uneven surfaces activity did  not occur: Safety/medical concerns (R hemi, LE weakness, decreased balance/postural control)         Wheelchair     Assist Will patient use wheelchair at discharge?: Yes Type of Wheelchair: Manual    Wheelchair assist level: Minimal Assistance - Patient > 75% Max wheelchair distance: 169ft    Wheelchair 50 feet with 2 turns activity    Assist        Assist Level: Minimal Assistance - Patient > 75%   Wheelchair 150 feet activity     Assist     Assist Level: Dependent - Patient 0%      Medical Problem List and Plan: 1. Right flaccid  hemiplegia, impaired mobility and ADLs secondary to left MCA ischemic stroke  Continue CIR  WHO/PRAFO nightly 2.  Antithrombotics: -DVT/anticoagulation:  Pharmaceutical: Other (comment)--Eliquis             -antiplatelet therapy: N/A 3. Pain Management: Tylenol prn.  4. Mood: LCSW to follow for evaluation and support.              -antipsychotic agents: N/A 5. Neuropsych: This patient is not capable of making decisions on his own behalf.  Trial of amantadine started on 6/26, discussed with therapies, DC'd on 7/1 6. Skin/Wound Care: N/A 7. Fluids/Electrolytes/Nutrition: Monitor I/Os. 8. PAF: Monitor HR tid--has been off Tikosyn since admission. Continue metoprolol bid with Eliquis.   Per cardiology, continue to monitor-no need for Tikosyn at present   Vitals:   02/13/20 0300 02/13/20 0739  BP: 121/67 97/64  Pulse: 96 76  Resp: 16   Temp: 98.9 F (37.2 C)   SpO2: 99%    9. HTN: Monitor BP--managed on metoprolol   Avapro decreased to 75 on 7/1.    Soft on 7/2, consider further medication adjustments if necessary  Monitor with increased mobility 10. Dyslipidemia: Continue Crestor. 11.  Post stroke dysphagia: D3, nectar liquids with chin tuck.   Hold tube feeds and start calorie count.   NG DC'd  Advance diet as tolerated  12. Impaired fasting glucose: Likely stress induced  Elevated on 7/2  Continue to monitor 13. Nonischemic cardiomyopathy: outpatient cardiology follow-up. 14.  Hypoalbuminemia  Supplement initiated on 6/23 15.  Leukocytosis: Resolved  WBCs 10.5 on 6/29  Afebrile  Continue to monitor 16.  AKI:   Creatinine 0.97 on 7/2, labs ordered for Monday  Encourage fluids  Continue to monitor 17. Constipation  Improving 18.  Urinary retention   Flomax started on 6/30  Improving on 7/2 19.  Spasticity , increased tone noted per PT, knee flexors on Right , start low dose tizanidine  LOS: 11 days A FACE TO FACE EVALUATION WAS PERFORMED  Charlett Blake 02/13/2020, 9:28 AM

## 2020-02-13 NOTE — Consult Note (Signed)
Responded to consult, called 4W desk, shared information for pt's nurse that notaries are not available on weekends, and a pt must request them to complete their HCPOA. It does not matter if family members are present, as they cannot be witnesses. [name] family member had requested that a notary come while family was there.   Rev. Eloise Levels Chaplain

## 2020-02-13 NOTE — Progress Notes (Signed)
Physical Therapy Session Note  Patient Details  Name: Dustin Schneider MRN: [id number] Date of Birth: [birth year]  Today's Date: 02/13/2020 PT Individual Time: 0910-1014 PT Individual Time Calculation (min): 64 min   Short Term Goals: Week 2:  PT Short Term Goal 1 (Week 2): Pt will perform bed mobility with min A PT Short Term Goal 2 (Week 2): Pt will transfer bed<>chair with LRAD min A PT Short Term Goal 3 (Week 2): Pt will ambulate 33ft with LRAD max A of 1  Skilled Therapeutic Interventions/Progress Updates:    Pt received supine in bed and agreeable to therapy session via yes head nod. RN in/out notifying therapist that pt had orthostatic hypotension yesterday and to monitor pt for symptoms. Therapist donned B LE thigh high TED hose max assist. Supine>sitting R EOB with mod assist for LE management and trunk upright - pt using L LE to assist with R LE and reaching for footboard with L UE to assist with trunk upright. Sitting EOB with supervision donned B LE shoes (removed AFO at this time). L squat pivot to w/c mod assist for lifting and pivoting - therapist cuing pt to hold her arm with L UE to force increased use of R LE during transfer as pt compensates with L UE.  Transported to/from gym in w/c for time management and energy conservation. R squat pivot to EOM mod assist for lifting/pivoting - assist for R LE positioning and continued cuing as above. Participated in forward trunk leaning task with goal of increased B LE (R>L) weightbearing via achieving squat position while reaching forward to place clothespins on line; however, pt's RLE flexion tone causes his LE to draw up off of the floor unless therapist manually facilitates [place]. Transitioned to repeated sit<>stands to/from mat with mod assist for lifting and balance but pt continues to have R LE drawing up from the ground during the transfer without the manual facilitation for WBing. Progressed to L LE forward/backwards pre-gait stepping  targeting R LE NMR via Weaver with heavy mod assist for balance and mirror feedback for upright posture - manual facilitation for R lateral weight shifting and max assist to prevent R LE knee buckle. L squat pivot to w/c mod assist as described above. Donned R LE ankle DF assist ACE wrap. Gait training ~40ft x2 at L hallway rail (seated break between) with mod/max assist of 1 and +2 for w/c follow - manual facilitation for R/L lateral weight shifting, total assist for RLE swing phase as pt unable to perform hip/knee flexion to clear foot, max assist to block R knee buckle in stance - with facilitation pt able to perform reciprocal stepping pattern. Transitioned to gait training ~78ft x2 with +2 B HHAs to avoid increased compensation via L UE on hallway rail - pt demonstrates increased R LE flexor tone making it difficult to get his foot on the floor after swing and wider BOS due to increased fear of falling without holding the rail - requires +2 skilled max assist to facilitate R/L lateral weight shift onto stance limb, total assist for R LE positioning during and after swing to extend knee to get heel strike on initial contact due to flexor tone, and max assist to block knee buckle during stance. Transported back to room and pt agreeable to remain seated in w/c. MD present for morning assessment - notified of R LE flexor tone. Pt left seated in w/c with needs in reach and seat belt alarm on.  No symptoms of  hypotension noted during session and pt nodded head "no" when inquired about lightheadedness or dizziness symptoms.   Therapist donned saebo e-stim unit to pt's R deltoid (anterior/posterior) at 8 clicks intensity for 1 hour, unattended, targeting muscle activation to promote joint approximation - returned to room and skin intact with no signs of skin irritation and pt does not report any adverse side effects.  Saebo Stim One Details 330 pulse width 35 Hz pulse rate On 8 sec/ off 8 sec Ramp up/ down 2  sec Symmetrical Biphasic wave form  Max intensity 116mA at 500 Ohm load     Therapy Documentation Precautions:  Precautions Precautions: Fall Precaution Comments: pusher, dense R hemiparesis Restrictions Weight Bearing Restrictions: No  Pain:   No indications of pain during session.   Therapy/Group: Individual Therapy  Tawana Scale, PT, DPT, CSRS  02/13/2020, 7:51 AM

## 2020-02-13 NOTE — Plan of Care (Signed)
°  Problem: Consults Goal: RH STROKE PATIENT EDUCATION Description: See Patient Education module for education specifics  Outcome: Progressing   Problem: RH BOWEL ELIMINATION Goal: RH STG MANAGE BOWEL WITH ASSISTANCE Description: STG Manage Bowel with min Assistance. Outcome: Progressing Goal: RH STG MANAGE BOWEL W/MEDICATION W/ASSISTANCE Description: STG Manage Bowel with Medication mod I with Assistance. Outcome: Progressing   Problem: RH BLADDER ELIMINATION Goal: RH STG MANAGE BLADDER WITH ASSISTANCE Description: STG Manage Bladder With min Assistance Outcome: Progressing Goal: RH STG MANAGE BLADDER WITH EQUIPMENT WITH ASSISTANCE Description: STG Manage Bladder With Equipment With min Assistance Outcome: Progressing   Problem: RH SKIN INTEGRITY Goal: RH STG SKIN FREE OF INFECTION/BREAKDOWN Description: Skin will remain free from injury with min assist Outcome: Progressing Goal: RH STG MAINTAIN SKIN INTEGRITY WITH ASSISTANCE Description: STG Maintain Skin Integrity With min Assistance. Outcome: Progressing

## 2020-02-14 ENCOUNTER — Inpatient Hospital Stay (HOSPITAL_COMMUNITY): Payer: BC Managed Care – PPO | Admitting: Occupational Therapy

## 2020-02-14 MED ORDER — BETHANECHOL CHLORIDE 10 MG PO TABS
10.0000 mg | ORAL_TABLET | Freq: Three times a day (TID) | ORAL | Status: DC
  Administered 2020-02-14 – 2020-02-16 (×9): 10 mg via ORAL
  Filled 2020-02-14 (×10): qty 1

## 2020-02-14 NOTE — Progress Notes (Signed)
[facility] PHYSICAL MEDICINE & REHABILITATION PROGRESS NOTE  Subjective/Complaints:  Per PT increased tone Right knee flexors   GGY:IRSWNIOEVO aphasia   Objective: Vital Signs: Blood pressure 120/70, pulse 71, temperature 98.4 F (36.9 C), temperature source Oral, resp. rate 18, height 5\' 9"  (1.753 m), weight 79.8 kg, SpO2 98 %. No results found. No results for input(s): WBC, HGB, HCT, PLT in the last 72 hours. Recent Labs    02/12/20 0555  NA 140  K 4.2  CL 105  CO2 25  GLUCOSE 127*  BUN 25*  CREATININE 0.97  CALCIUM 8.8*    Physical Exam: BP 120/70 (BP Location: Left Arm)   Pulse 71   Temp 98.4 F (36.9 C) (Oral)   Resp 18   Ht 5\' 9"  (1.753 m)   Wt 79.8 kg   SpO2 98%   BMI 25.98 kg/m   General: No acute distress Mood and affect are appropriate Heart: Regular rate and rhythm no rubs murmurs or extra sounds Lungs: Clear to auscultation, breathing unlabored, no rales or wheezes Abdomen: Positive bowel sounds, soft nontender to palpation, nondistended Extremities: No clubbing, cyanosis, or edema Skin: No evidence of breakdown, no evidence of rash   Motor: Appears to be 5/5 on left side and 0/5 on right side, unchanged No increase in tone appreciated, no change  Assessment/Plan: 1. Functional deficits secondary to left MCA infarct which require 3+ hours per day of interdisciplinary therapy in a comprehensive inpatient rehab setting.  Physiatrist is providing close team supervision and 24 hour management of active medical problems listed below.  Physiatrist and rehab team continue to assess barriers to discharge/monitor patient progress toward functional and medical goals  Care Tool:  Bathing    Body parts bathed by patient: Right arm, Chest, Abdomen, Front perineal area, Left upper leg, Face, Right upper leg   Body parts bathed by helper: Left arm     Bathing assist Assist Level: Moderate Assistance - Patient 50 - 74%     Upper Body  Dressing/Undressing Upper body dressing   What is the patient wearing?: Pull over shirt    Upper body assist Assist Level: Minimal Assistance - Patient > 75%    Lower Body Dressing/Undressing Lower body dressing      What is the patient wearing?: Pants     Lower body assist Assist for lower body dressing: Maximal Assistance - Patient 25 - 49%     Toileting Toileting    Toileting assist Assist for toileting: 2 Helpers     Transfers Chair/bed transfer  Transfers assist  Chair/bed transfer activity did not occur: Safety/medical concerns  Chair/bed transfer assist level: Moderate Assistance - Patient 50 - 74% (squat pivot)     Locomotion Ambulation   Ambulation assist      Assist level: 2 helpers Assistive device: Other (comment) (3 musketeer) Max distance: 68ft   Walk 10 feet activity   Assist  Walk 10 feet activity did not occur: Safety/medical concerns  Assist level: 2 helpers Assistive device: Other (comment) (3 musketeer)   Walk 50 feet activity   Assist Walk 50 feet with 2 turns activity did not occur: Safety/medical concerns         Walk 150 feet activity   Assist Walk 150 feet activity did not occur: Safety/medical concerns (R hemi, LE weakness, decreased balance/postural control)         Walk 10 feet on uneven surface  activity   Assist Walk 10 feet on uneven surfaces activity did not  occur: Safety/medical concerns (R hemi, LE weakness, decreased balance/postural control)         Wheelchair     Assist Will patient use wheelchair at discharge?: Yes Type of Wheelchair: Manual    Wheelchair assist level: Minimal Assistance - Patient > 75% Max wheelchair distance: 168ft    Wheelchair 50 feet with 2 turns activity    Assist        Assist Level: Minimal Assistance - Patient > 75%   Wheelchair 150 feet activity     Assist     Assist Level: Dependent - Patient 0%      Medical Problem List and Plan: 1.  Right flaccid hemiplegia, impaired mobility and ADLs secondary to left MCA ischemic stroke  Continue CIR  WHO/PRAFO nightly 2.  Antithrombotics: -DVT/anticoagulation:  Pharmaceutical: Other (comment)--Eliquis             -antiplatelet therapy: N/A 3. Pain Management: Tylenol prn.  4. Mood: LCSW to follow for evaluation and support.              -antipsychotic agents: N/A 5. Neuropsych: This patient is not capable of making decisions on his own behalf.  Trial of amantadine started on 6/26, discussed with therapies, DC'd on 7/1 6. Skin/Wound Care: N/A 7. Fluids/Electrolytes/Nutrition: Monitor I/Os. 8. PAF: Monitor HR tid--has been off Tikosyn since admission. Continue metoprolol bid with Eliquis.   Per cardiology, continue to monitor-no need for Tikosyn at present   Vitals:   02/13/20 2022 02/14/20 0649  BP: 114/83 120/70  Pulse: 88 71  Resp: 18 18  Temp: 98.5 F (36.9 C) 98.4 F (36.9 C)  SpO2: 97% 98%   9. HTN: Monitor BP--managed on metoprolol   Avapro decreased to 75 on 7/1.    Soft on 7/2, consider further medication adjustments if necessary  BP controlled 7/4 10. Dyslipidemia: Continue Crestor. 11.  Post stroke dysphagia: D3, nectar liquids with chin tuck.   Hold tube feeds and start calorie count.   NG DC'd  Advance diet as tolerated  12. Impaired fasting glucose: Likely stress induced  Elevated on 7/2  Continue to monitor 13. Nonischemic cardiomyopathy: outpatient cardiology follow-up. 14.  Hypoalbuminemia  Supplement initiated on 6/23 15.  Leukocytosis: Resolved  WBCs 10.5 on 6/29  Afebrile  Continue to monitor 16.  AKI:   Creatinine 0.97 on 7/2, labs ordered for Monday  Encourage fluids  Continue to monitor 17. Constipation  Improving 18.  Urinary retention   Flomax started on 6/30 Required I/O cath this am an dyesteray start urecholine  19.  Spasticity , increased tone noted per PT, knee flexors on Right , start low dose tizanidine  LOS: 12 days A FACE  TO Sibley E Brieann Osinski 02/14/2020, 8:38 AM

## 2020-02-14 NOTE — Progress Notes (Signed)
Occupational Therapy Session Note  Patient Details  Name: Dustin Schneider MRN: [id number] Date of Birth: 30-Jun-1956  Today's Date: 02/14/2020 OT Individual Time: 1017-1059 OT Individual Time Calculation (min): 42 min    Skilled Therapeutic Interventions/Progress Updates:    Pt greeted in bed, nodding when asked he wanted to get up in the chair. Mod A for supine<sit and also for squat pivot<w/c going towards the Lt side. Pt nodded when asked if he wanted to brush his teeth. He initiated reaching for needed items when w/c was parked in front of sink. OT facilitated Rt UE as gross stabilizer during task and also facilitated forearm weightbearing on the sink as appropriate. Pt washed/dried his face with setup. OT then presented him with his communication board, going over self care needs/pain. Pt shook his head when asked about each category, nodding when asked if he wanted to work on his Rt arm today. Escorted him via w/c to the dayroom and guided pt through ROM exercises using UE ranger first. Pt with no active movement in the Rt UE and required HOH from Lt UE and also from OT to participate in exercises. Afterwards guided him through self ROM exercises including arm cradles, shoulder shrugs, and chin tucks, 10 reps 2 sets. Forearm supination 10 reps 2 sets as well. Pt was then escorted back to his room, agreeable to remain sitting up in the w/c. Left him with all needs within reach, half lap tray in place, and safety belt fastened.    Therapy Documentation Precautions:  Precautions Precautions: Fall Precaution Comments: pusher, dense R hemiparesis Restrictions Weight Bearing Restrictions: No Vital Signs: Therapy Vitals Pulse Rate: 65 BP: 108/71 ADL: ADL Eating: Moderate cueing, Supervision/safety Grooming: Minimal assistance Where Assessed-Grooming: Sitting at sink Upper Body Bathing: Minimal assistance Where Assessed-Upper Body Bathing: Bed level Lower Body Bathing: Maximal  assistance Where Assessed-Lower Body Bathing: Bed level Upper Body Dressing: Maximal assistance Where Assessed-Upper Body Dressing: Bed level Lower Body Dressing: Maximal assistance Where Assessed-Lower Body Dressing: Bed level Toilet Transfer:  (MOD A STS in stedy)      Therapy/Group: Individual Therapy  Xianna Siverling A Nivek Powley 02/14/2020, 12:12 PM

## 2020-02-14 NOTE — Plan of Care (Signed)
  Problem: Consults Goal: RH STROKE PATIENT EDUCATION Description: See Patient Education module for education specifics  Outcome: Progressing   Problem: RH BOWEL ELIMINATION Goal: RH STG MANAGE BOWEL WITH ASSISTANCE Description: STG Manage Bowel with min Assistance. Outcome: Progressing Goal: RH STG MANAGE BOWEL W/MEDICATION W/ASSISTANCE Description: STG Manage Bowel with Medication mod I with Assistance. Outcome: Progressing   Problem: RH BLADDER ELIMINATION Goal: RH STG MANAGE BLADDER WITH ASSISTANCE Description: STG Manage Bladder With min Assistance Outcome: Progressing Goal: RH STG MANAGE BLADDER WITH EQUIPMENT WITH ASSISTANCE Description: STG Manage Bladder With Equipment With min Assistance Outcome: Progressing   Problem: RH SKIN INTEGRITY Goal: RH STG SKIN FREE OF INFECTION/BREAKDOWN Description: Skin will remain free from injury with min assist Outcome: Progressing Goal: RH STG MAINTAIN SKIN INTEGRITY WITH ASSISTANCE Description: STG Maintain Skin Integrity With min Assistance. Outcome: Progressing

## 2020-02-15 ENCOUNTER — Inpatient Hospital Stay (HOSPITAL_COMMUNITY): Payer: BC Managed Care – PPO | Admitting: Speech Pathology

## 2020-02-15 ENCOUNTER — Inpatient Hospital Stay (HOSPITAL_COMMUNITY): Payer: BC Managed Care – PPO

## 2020-02-15 DIAGNOSIS — G8110 Spastic hemiplegia affecting unspecified side: Secondary | ICD-10-CM

## 2020-02-15 LAB — BASIC METABOLIC PANEL
Anion gap: 10 (ref 5–15)
BUN: 26 mg/dL — ABNORMAL HIGH (ref 8–23)
CO2: 25 mmol/L (ref 22–32)
Calcium: 8.7 mg/dL — ABNORMAL LOW (ref 8.9–10.3)
Chloride: 106 mmol/L (ref 98–111)
Creatinine, Ser: 0.95 mg/dL (ref 0.61–1.24)
GFR calc Af Amer: >60 mL/min (ref >60)
GFR calc non Af Amer: >60 mL/min (ref >60)
Glucose, Bld: 157 mg/dL — ABNORMAL HIGH (ref 70–99)
Potassium: 3.9 mmol/L (ref 3.5–5.1)
Sodium: 141 mmol/L (ref 135–145)

## 2020-02-15 LAB — CBC WITH DIFFERENTIAL/PLATELET
Abs Immature Granulocytes: 0.02 10*3/uL (ref 0.00–0.07)
Basophils Absolute: 0 10*3/uL (ref 0.0–0.1)
Basophils Relative: 1 %
Eosinophils Absolute: 0.1 10*3/uL (ref 0.0–0.5)
Eosinophils Relative: 2 %
HCT: 42.8 % (ref 39.0–52.0)
Hemoglobin: 13.7 g/dL (ref 13.0–17.0)
Immature Granulocytes: 0 %
Lymphocytes Relative: 25 %
Lymphs Abs: 1.6 10*3/uL (ref 0.7–4.0)
MCH: 31.4 pg (ref 26.0–34.0)
MCHC: 32 g/dL (ref 30.0–36.0)
MCV: 98.2 fL (ref 80.0–100.0)
Monocytes Absolute: 0.3 10*3/uL (ref 0.1–1.0)
Monocytes Relative: 4 %
Neutro Abs: 4.5 10*3/uL (ref 1.7–7.7)
Neutrophils Relative %: 68 %
Platelets: 366 10*3/uL (ref 150–400)
RBC: 4.36 MIL/uL (ref 4.22–5.81)
RDW: 13.2 % (ref 11.5–15.5)
WBC: 6.5 10*3/uL (ref 4.0–10.5)
nRBC: 0 % (ref 0.0–0.2)

## 2020-02-15 NOTE — Progress Notes (Signed)
Physical Therapy Session Note  Patient Details  Name: Dustin Schneider MRN: [id number] Date of Birth: 1956/01/30  Today's Date: 02/15/2020 PT Individual Time: 1000-1059 PT Individual Time Calculation (min): 59 min   Short Term Goals: Week 1:  PT Short Term Goal 1 (Week 1): Pt will transfer supine<>sitting EOB with mod A PT Short Term Goal 1 - Progress (Week 1): Met PT Short Term Goal 2 (Week 1): Pt will transfer bed<>chair with LRAD mod A PT Short Term Goal 2 - Progress (Week 1): Met PT Short Term Goal 3 (Week 1): Pt will perform simulated car transfer with LRAD max A PT Short Term Goal 3 - Progress (Week 1): Met Week 2:  PT Short Term Goal 1 (Week 2): Pt will perform bed mobility with min A PT Short Term Goal 2 (Week 2): Pt will transfer bed<>chair with LRAD min A PT Short Term Goal 3 (Week 2): Pt will ambulate 64f with LRAD max A of 1  Skilled Therapeutic Interventions/Progress Updates:   Received pt sitting in WC, pt agreeable to therapy, and denied any pain during session. [name] brother in law present at bedside. Session with emphasis on functional mobility/transfers, dynamic standing balance/coordination, gait training, NMR, motor control/sequencing, midline orientation, and improved activity tolerance. [facility] and [facility] sit<>stand mod A of 1 and required +2 assist to don LiteGait harness in standing. Pt stepped up onto treadmill with mod A +2 and manual facilitation to block R knee buckling. Pt worked on gPersonnel officeron LiteGait treadmill for the following time frames and distances with L UE ace wrapped to handgrip for support.  Trial 1: 2 minutes for 524fat 0.69m39mTrial 2: 3 minutes and 54 seconds for 15f37f 0.2mph69mial 3: 1 minute for 14ft 60f.2mph T40ml 4: 3 minutes 36 seconds for 63ft at83fmph Pt 75muired total A of 1 for R LE advancement, knee extension, hip extension and to advance over R LE and total A of 2 for weight shifting and  pelvic alignment/rotation. Pt required multiple rest breaks throughout session due to increased fatigue. Stepped off treadmill with mod A +2 with therapist advancing R LE and controlling knee buckling. Pt transported back to room in WC total [facility] Concluded session with pt sitting in WC, needs within reach, and seatbelt alarm on.   Therapy Documentation Precautions:  Precautions Precautions: Fall Precaution Comments: pusher, dense R hemiparesis Restrictions Weight Bearing Restrictions: No  Therapy/Group: Individual Therapy Emilyrose Darrah M JoAlfonse Alpers  02/15/2020, 7:36 AM

## 2020-02-15 NOTE — Plan of Care (Signed)
  Problem: RH BLADDER ELIMINATION Goal: RH STG MANAGE BLADDER WITH ASSISTANCE Description: STG Manage Bladder With min Assistance Outcome: Progressing Goal: RH STG MANAGE BLADDER WITH EQUIPMENT WITH ASSISTANCE Description: STG Manage Bladder With Equipment With min Assistance Outcome: Progressing   Problem: RH SKIN INTEGRITY Goal: RH STG SKIN FREE OF INFECTION/BREAKDOWN Description: Skin will remain free from injury with min assist Outcome: Progressing

## 2020-02-15 NOTE — Progress Notes (Signed)
Speech Language Pathology Daily Session Note  Patient Details  Name: Dustin Schneider MRN: [id number] Date of Birth: 12/25/1955  Today's Date: 02/15/2020 SLP Individual Time: 0825-0920 SLP Individual Time Calculation (min): 55 min  Short Term Goals: Week 2: SLP Short Term Goal 1 (Week 2): Pt will consume current diet with minimal overt s/sx aspiration, efficient mastication and oral clearance Mod I for use of strategies. SLP Short Term Goal 2 (Week 2): Pt will consume trials of thin liquids with minimal overt s/sx aspiration over 3 sessions prior to repeat MBSS. SLP Short Term Goal 3 (Week 2): Pt will consume trials of dysphagia 3 (mech soft) solids demonstrating efficient mastication and oral clearance X2 prior to advancement. SLP Short Term Goal 4 (Week 2): Pt will use multimodal means of communication to express basic wants and needs with Mod A multimodal cues. SLP Short Term Goal 5 (Week 2): Pt will verbalize at the word level with Mod A multimodal cues. SLP Short Term Goal 6 (Week 2): Pt will detect functional and/or verbal errors with Mod A multimodal cues.  Skilled Therapeutic Interventions: Skilled treatment session focused on dysphagia and communication goals. SLP facilitated session by providing set-up assist for oral care at the sink. Patient consumed trials of thin liquids via cup with cues for a chin tuck (per last MBS report) with overt cough X 1. Patient with demonstrated what appeared to be inconsistent timing of swallow trigger. Patient scheduled for a repeat MBS tomorrow to assess swallow function and possible upgrade. SLP also facilitated session by providing phonemic, articulatory and visual cues for patient to verbalize at the word level with 100% accuracy when verbalizing words of personal relevance. However, accuracy decreased when naming functional items to ~25%. Patient left upright in the wheelchair with alarm on and all needs within reach. Continue with current plan of care.       Pain No/Denies Pain   Therapy/Group: Individual Therapy  [name] 02/15/2020, 12:36 PM

## 2020-02-15 NOTE — Progress Notes (Signed)
Dustin Schneider PHYSICAL MEDICINE & REHABILITATION PROGRESS NOTE  Subjective/Complaints: Patient seen working with therapy this morning.  He indicates he slept well overnight.  He did feel good weekend.  He appears to have a question, but is not able to communicate.  ROS: Limited due to aphasia  Objective: Vital Signs: Blood pressure 116/71, pulse (!) 103, temperature 98.8 F (37.1 C), resp. rate 16, height 5\' 9"  (1.753 m), weight 79.8 kg, SpO2 95 %. No results found. Recent Labs    02/15/20 0746  WBC 6.5  HGB 13.7  HCT 42.8  PLT 366   Recent Labs    02/15/20 0746  NA 141  K 3.9  CL 106  CO2 25  GLUCOSE 157*  BUN 26*  CREATININE 0.95  CALCIUM 8.7*    Physical Exam: BP 116/71 (BP Location: Left Arm)   Pulse (!) 103   Temp 98.8 F (37.1 C)   Resp 16   Ht 5\' 9"  (1.753 m)   Wt 79.8 kg   SpO2 95%   BMI 25.98 kg/m  Constitutional: No distress . Vital signs reviewed. HENT: Normocephalic.  Atraumatic. Eyes: EOMI. No discharge. Cardiovascular: No JVD. Respiratory: Normal effort.  No stridor. GI: Non-distended. Skin: Warm and dry.  Intact. Psych: Normal mood.  Normal behavior. Musc: No edema in extremities.  No tenderness in extremities. Neuro: Alert Motor: LUE/LLE: 0/5  Assessment/Plan: 1. Functional deficits secondary to left MCA infarct which require 3+ hours per day of interdisciplinary therapy in a comprehensive inpatient rehab setting.  Physiatrist is providing close team supervision and 24 hour management of active medical problems listed below.  Physiatrist and rehab team continue to assess barriers to discharge/monitor patient progress toward functional and medical goals  Care Tool:  Bathing    Body parts bathed by patient: Right arm, Chest, Abdomen, Front perineal area, Left upper leg, Face, Right upper leg   Body parts bathed by helper: Left arm     Bathing assist Assist Level: Moderate Assistance - Patient 50 - 74%     Upper Body  Dressing/Undressing Upper body dressing   What is the patient wearing?: Pull over shirt    Upper body assist Assist Level: Minimal Assistance - Patient > 75%    Lower Body Dressing/Undressing Lower body dressing      What is the patient wearing?: Pants     Lower body assist Assist for lower body dressing: Maximal Assistance - Patient 25 - 49%     Toileting Toileting    Toileting assist Assist for toileting: 2 Helpers     Transfers Chair/bed transfer  Transfers assist  Chair/bed transfer activity did not occur: Safety/medical concerns  Chair/bed transfer assist level: Moderate Assistance - Patient 50 - 74% (squat pivot)     Locomotion Ambulation   Ambulation assist      Assist level: 2 helpers Assistive device: Lite Gait Max distance: 15ft   Walk 10 feet activity   Assist  Walk 10 feet activity did not occur: Safety/medical concerns  Assist level: 2 helpers Assistive device: Lite Gait   Walk 50 feet activity   Assist Walk 50 feet with 2 turns activity did not occur: Safety/medical concerns  Assist level: 2 helpers Assistive device: Lite Gait    Walk 150 feet activity   Assist Walk 150 feet activity did not occur: Safety/medical concerns (R hemi, LE weakness, decreased balance/postural control)         Walk 10 feet on uneven surface  activity   Assist Walk 10  feet on uneven surfaces activity did not occur: Safety/medical concerns (R hemi, LE weakness, decreased balance/postural control)         Wheelchair     Assist Will patient use wheelchair at discharge?: Yes Type of Wheelchair: Manual    Wheelchair assist level: Minimal Assistance - Patient > 75% Max wheelchair distance: 163ft    Wheelchair 50 feet with 2 turns activity    Assist        Assist Level: Minimal Assistance - Patient > 75%   Wheelchair 150 feet activity     Assist     Assist Level: Dependent - Patient 0%      Medical Problem List and  Plan: 1. Right flaccid hemiplegia, impaired mobility and ADLs secondary to left MCA ischemic stroke  Continue CIR  WHO/PRAFO nightly 2.  Antithrombotics: -DVT/anticoagulation:  Pharmaceutical: Other (comment)--Eliquis             -antiplatelet therapy: N/A 3. Pain Management: Tylenol prn.  4. Mood: LCSW to follow for evaluation and support.              -antipsychotic agents: N/A 5. Neuropsych: This patient is not capable of making decisions on his own behalf.  Trial of amantadine started on 6/26, discussed with therapies, DC'd on 7/1 6. Skin/Wound Care: N/A 7. Fluids/Electrolytes/Nutrition: Monitor I/Os. 8. PAF: Monitor HR tid--has been off Tikosyn since admission. Continue metoprolol bid with Eliquis.   Per cardiology, continue to monitor-no need for Tikosyn at present   Vitals:   02/15/20 0900 02/15/20 1637  BP: 102/82 116/71  Pulse: (!) 55 (!) 103  Resp:  16  Temp:  98.8 F (37.1 C)  SpO2:  95%   9. HTN: Monitor BP--managed on metoprolol   Avapro decreased to 75 on 7/1.    Relatively controlled on 7/5 10. Dyslipidemia: Continue Crestor. 11.  Post stroke dysphagia: D3, nectar liquids with chin tuck.   Hold tube feeds and start calorie count.   NG DC'd  Advance diet as tolerated  12. Impaired fasting glucose: Likely stress induced  Elevated on 7/5  Continue to monitor 13. Nonischemic cardiomyopathy: outpatient cardiology follow-up. 14.  Hypoalbuminemia  Supplement initiated on 6/23 15.  Leukocytosis: Resolved  WBCs 10.5 on 6/29  Afebrile  Continue to monitor 16.  AKI:   Creatinine 0.95 on 7/5  Encourage fluids  Continue to monitor 17. Constipation  Improving 18.  Urinary retention   Flomax started on 6/30  Urecholine started  ?  Some improvement 19.  Spasticity , increased tone noted per PT, knee flexors on Right  Low dose tizanidine started  LOS: 13 days A FACE TO FACE EVALUATION WAS PERFORMED  Dustin Schneider Dustin Schneider 02/15/2020, 4:39 PM

## 2020-02-15 NOTE — Progress Notes (Signed)
Occupational Therapy Session Note  Patient Details  Name: Dustin Schneider MRN: [id number] Date of Birth: [birth year]  Today's Date: 02/15/2020 OT Individual Time: 1400-1520 OT Individual Time Calculation (min): 80 min    Short Term Goals: Week 2:  OT Short Term Goal 1 (Week 2): Pt will attend to RUE without verbal cues during functional transfers. OT Short Term Goal 2 (Week 2): Pt will don shirt, carrying over hemi dressing techniques without verbal cues. OT Short Term Goal 3 (Week 2): Pt complete last step of donning pants to pull pants over waist with modA. OT Short Term Goal 4 (Week 2): Pt will complete a functional dynamic sitting task for 2 minutes with no more than close supervision.  Skilled Therapeutic Interventions/Progress Updates:    Pt received sitting up in w/c, nodding yes to taking shower. Pt completed stand pivot transfer toward the R side using grab bar with mod A overall, but upright and sudden descent (likely knee buckling with weight shift), sitting halfway on TTB and half on therapist knee. Pt able to scoot back onto bench with mod A overall. Pt doffed shirt with set up assist, mod A to doff LB clothing. Pt completed UB bathing with wash mit donned to R UE with mod HOH assist. Pt completed LB bathing with lateral leans with mod A overall. Several LOB to the R with min A to correct with grab bar use. Pt completed squat pivot out of shower and to w/c with mod A. Pt required multimodal cues to use hemi method to don shirt with mod A. Pt donned shorts with mod A, good pulling up in standing with heavy R lean and support from OT. Pt completed oral care with set up assist. Min A for shaving task with electric razor. Pt given cup of nectar thick apple juice to increase liquid intake. Pt left sitting up in the w/c with all needs met, chair alarm set.   Therapy Documentation Precautions:  Precautions Precautions: Fall Precaution Comments: pusher, dense R  hemiparesis Restrictions Weight Bearing Restrictions: No  Therapy/Group: Individual Therapy  Curtis Sites 02/15/2020, 2:49 PM

## 2020-02-16 ENCOUNTER — Ambulatory Visit (HOSPITAL_COMMUNITY): Payer: BC Managed Care – PPO | Admitting: Physical Therapy

## 2020-02-16 ENCOUNTER — Inpatient Hospital Stay (HOSPITAL_COMMUNITY): Payer: BC Managed Care – PPO | Admitting: Occupational Therapy

## 2020-02-16 ENCOUNTER — Inpatient Hospital Stay (HOSPITAL_COMMUNITY): Payer: BC Managed Care – PPO

## 2020-02-16 ENCOUNTER — Encounter (HOSPITAL_COMMUNITY): Payer: BC Managed Care – PPO | Admitting: Speech Pathology

## 2020-02-16 MED ORDER — IRBESARTAN 75 MG PO TABS: 37.5000 mg | ORAL_TABLET | Freq: Every day | ORAL | Status: DC

## 2020-02-16 MED ORDER — IRBESARTAN 75 MG PO TABS
37.5000 mg | ORAL_TABLET | Freq: Every day | ORAL | Status: DC
  Administered 2020-02-16 – 2020-02-24 (×9): 37.5 mg via ORAL
  Filled 2020-02-16 (×8): qty 1

## 2020-02-16 NOTE — Progress Notes (Signed)
Chaplain responded to Spiritual Consult re. Notary of AD.  Patient was out of room.  Chaplain spoke with nurse who reported the patient's brother-in-law was trying to get the document notarized yesterday.  Brother-in-law took AD with him and will return tomorrow.  Chaplain requests a new Spiritual Consult for notary be initiated when brother-in-law arrives with AD.  De Burrs Chaplain Resident

## 2020-02-16 NOTE — Progress Notes (Signed)
Occupational Therapy Session Note  Patient Details  Name: Dustin Schneider MRN: [id number] Date of Birth: 01/09/1956  Today's Date: 02/16/2020 OT Individual Time: 1417-1450 OT Individual Time Calculation (min): 33 min    Short Term Goals: Week 2:  OT Short Term Goal 1 (Week 2): Pt will attend to RUE without verbal cues during functional transfers. OT Short Term Goal 2 (Week 2): Pt will don shirt, carrying over hemi dressing techniques without verbal cues. OT Short Term Goal 3 (Week 2): Pt complete last step of donning pants to pull pants over waist with modA. OT Short Term Goal 4 (Week 2): Pt will complete a functional dynamic sitting task for 2 minutes with no more than close supervision.  Skilled Therapeutic Interventions/Progress Updates:    Treatment session with focus on NMR, trunk control, and functional reach to improve reach for LB dressing. Pt received seated in w/c. Completed squat pivot transfer w/c < mat with minA. Educated pt on importance of supporting RUE during movement, pt demonstrated carryover by tucking RUE into gait belt before transfers. Completed activity sitting EOM targeting anterior weight shift, functional reach of BUE, and increased muscle activation of RUE to improve functional reach to thread BLE into pants and don socks. Facilitated at the back, lateral trunk, and elbow to increase muscle engagement and anterior weight shift. Provided multimodal cueing to pace to target reaching to end range and to  improve trunk control. Completed towel glides with RUE anteriorly and laterally at tabletop sitting EOM, facilitating at the elbow and trunk to pace and increase reach. Engaged in lateral sit ups with therapy ball, targeting trunk extension and weight bearing to R side to provide proprioceptive input to RUE and to facilitate improved trunk control and awareness of midline. Completed squat pivot mat < w/c with minA. Ended session with pt seated in w/c with lap tray supporting  RUE, seat alarm on, and all needs within reach.   Therapy Documentation Precautions:  Precautions Precautions: Fall Precaution Comments: pusher, dense R hemiparesis Restrictions Weight Bearing Restrictions: No Pain:  No pain reported.   Therapy/Group: Individual Therapy  Michelle Nasuti 02/16/2020, 3:19 PM

## 2020-02-16 NOTE — Progress Notes (Signed)
Modified Barium Swallow Progress Note  Patient Details  Name: Dustin Schneider MRN: [id number] Date of Birth: [birth year]  Today's Date: 02/16/2020  Modified Barium Swallow completed.  Full report located under Chart Review in the Imaging Section.  Brief recommendations include the following:  Clinical Impression  Mild-mod dysphagia still evidenced, however pt with improved oropharyngeal swallow function since last MBSS performed 02/02/20. Although trace lingual/palatal residue resulted in reduced bolus cohesion of thin barium trials, residue was sensed and pt automatically triggered additional swallow to  clear residue without airway intrusion. Trace lateral channel residue present, but when pt took small single cup sips of thin barium, no penetration of aspiration was observed, and swallow initiation was timely. Use of straw to transit thin barium resulted in a less timely swallow, with initiation occurring at the level of the pyriform sinuses, ultimately leading to flash penetration and later frank aspiration of thin with a delayed cough response. Aspirate did appear to be cleared from vestibule in subsequent imaging, however not captured during cough response to definitively determine if it was ejected or simply traveled further down trachea. Pt consumed barium pill with thin via cup with adequate bolus cohesion and no airway intrusion, however would recommend conservative approach to medication administration due to potential for difficulty with dual consistencies given oral phase deficits than can lead to decreased bolus cohesion, increasing aspiration risk. Recommend pt continue dysphagia 3 solids, upgrade to thin liquids - single cup sips only, NO STRAWS, medication may be given whole in puree. ST will continue to follow to ensure diet safety, efficiency, and carryover of swallow strategies.   Swallow Evaluation Recommendations       SLP Diet Recommendations: Dysphagia 3 (Mech soft) solids;Thin  liquid   Liquid Administration via: Cup;No straw   Medication Administration: Whole meds with puree   Supervision: Patient able to self feed;Intermittent supervision to cue for compensatory strategies   Compensations: Slow rate;Small sips/bites   Postural Changes: Remain semi-upright after after feeds/meals (Comment);Seated upright at 90 degrees   Oral Care Recommendations: Oral care BID        Arbutus Leas 02/16/2020,9:56 AM

## 2020-02-16 NOTE — Progress Notes (Signed)
[place] PHYSICAL MEDICINE & REHABILITATION PROGRESS NOTE  Subjective/Complaints: Patient seen laying in bed this morning.  He is indicates he slept well overnight.  He is again trying to communicate something, but unable to do so.  He indicates he wore his orthoses overnight.  ROS: Limited due to aphasia, but appears to deny CP, shortness of breath, nausea, vomiting, diarrhea.  Objective: Vital Signs: Blood pressure 104/81, pulse 76, temperature 98.2 F (36.8 C), resp. rate 16, height 5\' 9"  (1.753 m), weight 79.8 kg, SpO2 97 %. No results found. Recent Labs    02/15/20 0746  WBC 6.5  HGB 13.7  HCT 42.8  PLT 366   Recent Labs    02/15/20 0746  NA 141  K 3.9  CL 106  CO2 25  GLUCOSE 157*  BUN 26*  CREATININE 0.95  CALCIUM 8.7*    Physical Exam: BP 104/81 (BP Location: Left Arm)   Pulse 76   Temp 98.2 F (36.8 C)   Resp 16   Ht 5\' 9"  (1.753 m)   Wt 79.8 kg   SpO2 97%   BMI 25.98 kg/m  Constitutional: No distress . Vital signs reviewed. HENT: Normocephalic.  Atraumatic. Eyes: EOMI. No discharge. Cardiovascular: No JVD.  Irregularly irregular. Respiratory: Normal effort.  No stridor.  Bilaterally clear to auscultation. GI: Non-distended.  BS +. Skin: Warm and dry.  Intact. Psych: Normal mood.  Normal behavior. Musc: No edema in extremities.  No tenderness in extremities. Neuro: Alert Motor: LUE/LLE: 0/5, unchanged  Assessment/Plan: 1. Functional deficits secondary to left MCA infarct which require 3+ hours per day of interdisciplinary therapy in a comprehensive inpatient rehab setting.  Physiatrist is providing close team supervision and 24 hour management of active medical problems listed below.  Physiatrist and rehab team continue to assess barriers to discharge/monitor patient progress toward functional and medical goals  Care Tool:  Bathing    Body parts bathed by patient: Right arm, Chest, Abdomen, Front perineal area, Left upper leg, Face, Right  upper leg   Body parts bathed by helper: Left arm     Bathing assist Assist Level: Moderate Assistance - Patient 50 - 74%     Upper Body Dressing/Undressing Upper body dressing   What is the patient wearing?: Pull over shirt    Upper body assist Assist Level: Minimal Assistance - Patient > 75%    Lower Body Dressing/Undressing Lower body dressing      What is the patient wearing?: Pants     Lower body assist Assist for lower body dressing: Maximal Assistance - Patient 25 - 49%     Toileting Toileting    Toileting assist Assist for toileting: 2 Helpers     Transfers Chair/bed transfer  Transfers assist  Chair/bed transfer activity did not occur: Safety/medical concerns  Chair/bed transfer assist level: Moderate Assistance - Patient 50 - 74% (squat pivot)     Locomotion Ambulation   Ambulation assist      Assist level: 2 helpers Assistive device: Lite Gait Max distance: 72ft   Walk 10 feet activity   Assist  Walk 10 feet activity did not occur: Safety/medical concerns  Assist level: 2 helpers Assistive device: Lite Gait   Walk 50 feet activity   Assist Walk 50 feet with 2 turns activity did not occur: Safety/medical concerns  Assist level: 2 helpers Assistive device: Lite Gait    Walk 150 feet activity   Assist Walk 150 feet activity did not occur: Safety/medical concerns (R hemi, LE weakness,  decreased balance/postural control)         Walk 10 feet on uneven surface  activity   Assist Walk 10 feet on uneven surfaces activity did not occur: Safety/medical concerns (R hemi, LE weakness, decreased balance/postural control)         Wheelchair     Assist Will patient use wheelchair at discharge?: Yes Type of Wheelchair: Manual    Wheelchair assist level: Minimal Assistance - Patient > 75% Max wheelchair distance: 157ft    Wheelchair 50 feet with 2 turns activity    Assist        Assist Level: Minimal Assistance -  Patient > 75%   Wheelchair 150 feet activity     Assist     Assist Level: Dependent - Patient 0%      Medical Problem List and Plan: 1. Right flaccid hemiplegia, impaired mobility and ADLs secondary to left MCA ischemic stroke  Continue CIR  WHO/PRAFO nightly 2.  Antithrombotics: -DVT/anticoagulation:  Pharmaceutical: Other (comment)--Eliquis             -antiplatelet therapy: N/A 3. Pain Management: Tylenol prn.  4. Mood: LCSW to follow for evaluation and support.              -antipsychotic agents: N/A 5. Neuropsych: This patient is not capable of making decisions on his own behalf.  Trial of amantadine started on 6/26, discussed with therapies, DC'd on 7/1 6. Skin/Wound Care: N/A 7. Fluids/Electrolytes/Nutrition: Monitor I/Os. 8. PAF: Monitor HR tid--has been off Tikosyn since admission. Continue metoprolol bid with Eliquis.   Per cardiology, continue to monitor-no need for Tikosyn at present   Vitals:   02/15/20 2053 02/16/20 0439  BP: 118/89 104/81  Pulse: 75 76  Resp:  16  Temp:  98.2 F (36.8 C)  SpO2: 98% 97%   9. HTN: Monitor BP--managed on metoprolol   Avapro decreased to 75 on 7/1, decreased to 37.5 on 7/6.    Relatively soft on 7/6 10. Dyslipidemia: Continue Crestor. 11.  Post stroke dysphagia: D3, nectar liquids with chin tuck.   Hold tube feeds and start calorie count.   NG DC'd  Advance diet as tolerated  12. Impaired fasting glucose: Likely stress induced  Elevated on 7/5  Continue to monitor 13. Nonischemic cardiomyopathy: outpatient cardiology follow-up. 14.  Hypoalbuminemia  Supplement initiated on 6/23 15.  Leukocytosis: Resolved  WBCs 10.5 on 6/29  Afebrile  Continue to monitor 16.  AKI:   Creatinine 0.95 on 7/5  Encourage fluids  Continue to monitor 17. Constipation  Improving 18.  Urinary retention   Flomax started on 6/30  Urecholine started  Appears to be improving on 7/6 19.  Spasticity , increased tone noted per PT, knee  flexors on Right  Low dose tizanidine started  LOS: 14 days A FACE TO FACE EVALUATION WAS PERFORMED  Reida Hem [name] 02/16/2020, 8:47 AM

## 2020-02-16 NOTE — Progress Notes (Signed)
Occupational Therapy Session Note  Patient Details  Name: Dustin Schneider MRN: [id number] Date of Birth: Nov 04, 1955  Today's Date: 02/16/2020 OT Individual Time: 9774-1423 OT Individual Time Calculation (min): 53 min    Short Term Goals: Week 2:  OT Short Term Goal 1 (Week 2): Pt will attend to RUE without verbal cues during functional transfers. OT Short Term Goal 2 (Week 2): Pt will don shirt, carrying over hemi dressing techniques without verbal cues. OT Short Term Goal 3 (Week 2): Pt complete last step of donning pants to pull pants over waist with modA. OT Short Term Goal 4 (Week 2): Pt will complete a functional dynamic sitting task for 2 minutes with no more than close supervision.  Skilled Therapeutic Interventions/Progress Updates:    Treatment session with focus on ADL retraining, functional transfers, and NMR. Pt semi-reclined in bed. Pt agreeable to groom and change pants. Completed squat pivot bed < w/c with minA with R knee block due to extensor tone of RLE. Set up ADL materials to R side to challenge pt to attend to R visual field. Pt completed oral care seated in w/c with minA to stabilize toothpaste container. Educated on use of sink to hold toothbrush, pt declined use. Pt declined dressing UB. Pt washed face with minA to stabilize washcloth, educated pt on laying washcloth across sink in order to pour soap one-handed. Donned pants with modA to bock R knee and provide assistance to thread RLE over foot. Donned socks with modA. Discussed various strategies to compensate while donning socks for decreased anterior reach and RLE weakness. Pt would benefit from continued compensatory strategies for LB dressing and intervention to target functional reach anteriorly. Pt declined use of AFO. Donned shoes with modA due to RLE weakness, discussed providing a handout on one-handed shoe tying in future sessions. Completed PROM of RUE for shoulder flexion, abduction, and internal rotation, pt  WNL. Completed shoulder shrugs and scapular retraction of BUE, pt with no trace movement in RUE at this time. Applied SAEBO and educated pt on shoulder movements to complete with BUE during SAEBO stim.Transport arrived with questions about pt's transfers. Recommended pt return to bed to ensure safety and completed squat pivot w/c < bed with minA. [name] RUE supported during movement with gait belt. SAEBO removed due to transport off unit for procedure. Ended session with pt semi-reclined in bed with all needs within reach and transport present.  Sa[place]o Stim One 330 pulse width 35 Hz pulse rate On 8 sec/ off 8 sec Ramp up/ down 2 sec Symmetrical Biphasic wave form  Max intensity 129mA at 500 Ohm load  Therapy Documentation Precautions:  Precautions Precautions: Fall Precaution Comments: pusher, dense R hemiparesis Restrictions Weight Bearing Restrictions: No   Pain:  Pt reported no pain.  Therapy/Group: Individual Therapy  Michelle Nasuti 02/16/2020, 11:09 AM

## 2020-02-16 NOTE — Plan of Care (Signed)
  Problem: RH BOWEL ELIMINATION Goal: RH STG MANAGE BOWEL WITH ASSISTANCE Description: STG Manage Bowel with min Assistance. Outcome: Progressing Goal: RH STG MANAGE BOWEL W/MEDICATION W/ASSISTANCE Description: STG Manage Bowel with Medication mod I with Assistance. Outcome: Progressing

## 2020-02-16 NOTE — Progress Notes (Signed)
Occupational Therapy Weekly Progress Note  Patient Details  Name: Dustin Schneider MRN: [id number] Date of Birth: 12-17-1955  Beginning of progress report period: February 10, 2020 End of progress report period: February 17, 2020  Today's Date: 02/16/2020 OT Individual Time: 2633-3545 OT Individual Time Calculation (min): 61 min    Patient has met 3 of 4 short term goals.  Pt is making steady progress towards goals. Pt requires minA for standing balance increasing to modA due to impaired dynamic standing balance due to RUE and RLE hemiplegia. Pt is completing squat pivot transfers with modA and R knee block. Pt is dressing UB with minA with cueing to carryover hemi dressing techniques and dressing LB with modA due to standing balance. Pt would benefit from continued NMR and dynamic standing balance intervention to increase independence during ADL tasks. Pt is increasingly attending to R visual field without cueing and to the RUE during transfers. Pt would benefit from continued education on hemi-dressing techniques and continued NMR to increase ROM and muscle activation of RUE and RLE.  Patient continues to demonstrate the following deficits: muscle weakness, abnormal tone and unbalanced muscle activation, decreased visual perceptual skills, decreased midline orientation and decreased attention to right and decreased sitting balance, decreased standing balance, decreased postural control and hemiplegia and therefore will continue to benefit from skilled OT intervention to enhance overall performance with BADL.  Patient progressing toward long term goals..  Continue plan of care.  OT Short Term Goals Week 2:  OT Short Term Goal 1 (Week 2): Pt will attend to RUE without verbal cues during functional transfers. OT Short Term Goal 1 - Progress (Week 2): Met OT Short Term Goal 2 (Week 2): Pt will don shirt, carrying over hemi dressing techniques without verbal cues. OT Short Term Goal 2 - Progress (Week 2):  Progressing toward goal OT Short Term Goal 3 (Week 2): Pt complete last step of donning pants to pull pants over waist with modA. OT Short Term Goal 3 - Progress (Week 2): Met OT Short Term Goal 4 (Week 2): Pt will complete a functional dynamic sitting task for 2 minutes with no more than close supervision. OT Short Term Goal 4 - Progress (Week 2): Met Week 3:  OT Short Term Goal 1 (Week 3): Pt will utilize RUE as a stabilizer without verbal cues during a grooming task. OT Short Term Goal 2 (Week 3): Pt will don pants with minA to demonstrate increased dynamic standing balance. OT Short Term Goal 3 (Week 3): Pt will don shirt with minA, carrying over hemi dressing techniques without verbal cues.  Skilled Therapeutic Interventions/Progress Updates:    Treatment session with focus on ADL retraining, NMR, and functional transfers. Pt received semi-reclined in bed. Completed supine to sit with minA to assist with management of RLE. Completed squat pivot bed < w/c with modA and R knee block due to RLE extensor tone. Set up ADLs to facilitate R attention and positioned RUE on sink to improve awareness and encourage use of RUE as a stabilizer during ADLs. Provided facilitation to encourage attend to RUE during ADL tasks, however pt carried over education on attending to [place] during transfers without cueing. Completed face washing with set up assist. Facilitated use of RUE as a stabilizer during oral care, pt able to complete oral care with minA. Doffed shirt with setup and donned shirt with minA to set up arm hole and provide multimodal cueing for initial step of hemi dressing technique. Pt would benefit  from continuing ADL training to carryover hemi-dressing techniques. Pt donned pants with modA to assist with power up for sit to stand and dynamic standing balance, with pt able to pull pants over waist with minimal assistance. Educated pt on use of shoe buttons; pt would benefit from provision and demonstration  of shoe buttons to increase LB dressing independence. Provided handouts on self-ROM. Pt completed the following exercises with multimodal cueing to ensure safe and effective carryover for the following exercises: shoulder flexion, shoulder internal rotation, and shoulder abduction and adduction. Educated pt on purpose of self-ROM. Ended session with pt seated in w/c with seat alarm on and all needs within reach.  Therapy Documentation Precautions:  Precautions Precautions: Fall Precaution Comments: pusher, dense R hemiparesis Restrictions Weight Bearing Restrictions: No  Pain: Pain Assessment Pain Scale: 0-10 Pain Score: 0-No pain Faces Pain Scale: No hurt   Therapy/Group: Individual Therapy  Michelle Nasuti 02/16/2020, 12:25 PM

## 2020-02-16 NOTE — Progress Notes (Signed)
Physical Therapy Session Note  Patient Details  Name: Dustin Schneider MRN: [id number] Date of Birth: 1955-09-04  Today's Date: 02/16/2020 PT Individual Time: 1100-1200 and 1300-1341 PT Individual Time Calculation (min): 60 min and 41 min  Short Term Goals: Week 1:  PT Short Term Goal 1 (Week 1): Pt will transfer supine<>sitting EOB with mod A PT Short Term Goal 1 - Progress (Week 1): Met PT Short Term Goal 2 (Week 1): Pt will transfer bed<>chair with LRAD mod A PT Short Term Goal 2 - Progress (Week 1): Met PT Short Term Goal 3 (Week 1): Pt will perform simulated car transfer with LRAD max A PT Short Term Goal 3 - Progress (Week 1): Met Week 2:  PT Short Term Goal 1 (Week 2): Pt will perform bed mobility with min A PT Short Term Goal 2 (Week 2): Pt will transfer bed<>chair with LRAD min A PT Short Term Goal 3 (Week 2): Pt will ambulate 45f with LRAD max A of 1  Skilled Therapeutic Interventions/Progress Updates:   Treatment Session 1:1100-1200 60 min Received pt supine in bed, pt agreeable to therapy, and denied any pain during session. Session with emphasis on functional mobility/transfers, generalized strengthening, dynamic standing balance/coordination, NMR, motor control/sequencing, midline orientation, gait training, and improved activity tolerance. Pt transferred supine<>sitting EOB with min A for R LE management and transferred squat<>pivot mod A x 3 trials throughout session with UEs supported on R knee to encourage weight bearing through R LE. Pt [facility]. DF ace wrap and leg loop applied to R LE total A. Pt worked on gPersonnel officerin hallway 262fx 6 trials with max A +2 and using 3 musketeer assist for 5/6 gait trials with 1 trial using L handrail. Pt required total A to advance R LE, facilitate weight bearing through R LE, control R ankle inversion, prevent R knee hyperextension/buckling, and to facilatate R hip extension with +2 assist to support pt and  prevent compensation using L handrail, all while using mirror for visual feedback. Transported to gym and donned MaxiSky harness sitting on mat with goal of continued gait training, however MaxiSky out of batteries and doffed harness total A. Pt transferred sit<>stand mod A of 1 and worked on pre-gait stepping with L LE x3 trials with emphasis on weight bearing through R LE, weight shifting, and midline orientation using mirror for visual feedback. Pt transported back to [facility]. Concluded session with pt sitting in WC, needs within reach, and seatbelt alarm on. Hemiplegic arm supported on lap tray.   Treatment Session 2: 136599-35701 min Received pt sitting in WC, pt agreeable to therapy, and denied any pain during session. Session with emphasis on functional mobility/transfers, generalized strengthening, dynamic standing balance/coordination, NMR, motor control/sequencing, midline orientation, toileting, and improved activity tolerance. Pt [facility] day[facility] and transferred sit<>stand<>tall kneeling mod A +2 x 2 trials throughout session. During trial 1 in standing pt attempting to place L knee on mat before therapist and tech were ready resulting in R knee buckling and LOB requiring max A +2 to prevent fall. Educated pt on importance of waiting until therapist is ready prior to initiating movement. In tall kneeling worked on reaching across midline to grab cones on R side and stack them on L side x 2 trials with emphasis on R UE/LE weight bearing and reaching outside BOS. Worked on bilateral hip extension in tall kneeling with cues to "touch  buttocks to heels" 2x10 with min A+2. Pt required multiple rest breaks throughout session due to increased fatigue and transferred tall kneeling<>L side sitting<>short sitting on mat with mod A +2 x 2 trials throughout session to rest. Squat<>pivot mat<>WC mod A with cues to push through R LE rather than pulling with L UE. Pt performed WC  mobility 180f using R hemi technique and supervision back to room. As therapist was leaving room pt called RN to use restroom. PT assisted RN with toilet transfer to [facility] purposes. Pt transferred sit<>stand min/mod A in SPaul Smithsand transported to toilet dependently. Concluded session with pt on toilet with RN present attending to care.   Therapy Documentation Precautions:  Precautions Precautions: Fall Precaution Comments: pusher, dense R hemiparesis Restrictions Weight Bearing Restrictions: No  Therapy/Group: Individual Therapy AAlfonse AlpersPT, DPT   02/16/2020, 7:35 AM

## 2020-02-17 ENCOUNTER — Inpatient Hospital Stay (HOSPITAL_COMMUNITY): Payer: BC Managed Care – PPO | Admitting: Occupational Therapy

## 2020-02-17 ENCOUNTER — Inpatient Hospital Stay (HOSPITAL_COMMUNITY): Payer: BC Managed Care – PPO

## 2020-02-17 ENCOUNTER — Inpatient Hospital Stay (HOSPITAL_COMMUNITY): Payer: BC Managed Care – PPO | Admitting: Speech Pathology

## 2020-02-17 MED ORDER — TAMSULOSIN HCL 0.4 MG PO CAPS
0.4000 mg | ORAL_CAPSULE | Freq: Every day | ORAL | Status: DC
  Administered 2020-02-18 – 2020-03-01 (×13): 0.4 mg via ORAL
  Filled 2020-02-17 (×13): qty 1

## 2020-02-17 MED ORDER — BETHANECHOL CHLORIDE 25 MG PO TABS
25.0000 mg | ORAL_TABLET | Freq: Three times a day (TID) | ORAL | Status: DC
  Administered 2020-02-17 – 2020-02-20 (×10): 25 mg via ORAL
  Filled 2020-02-17 (×9): qty 1

## 2020-02-17 MED ORDER — LIDOCAINE HCL URETHRAL/MUCOSAL 2 % EX GEL
1.0000 "application " | CUTANEOUS | Status: DC | PRN
  Administered 2020-02-17 – 2020-02-18 (×3): 1 via URETHRAL
  Filled 2020-02-17 (×3): qty 5

## 2020-02-17 MED ORDER — METOPROLOL TARTRATE 50 MG PO TABS
50.0000 mg | ORAL_TABLET | Freq: Two times a day (BID) | ORAL | Status: DC
  Administered 2020-02-18 – 2020-02-19 (×3): 50 mg via ORAL
  Filled 2020-02-17 (×4): qty 1

## 2020-02-17 MED ORDER — BETHANECHOL CHLORIDE 25 MG PO TABS
25.0000 mg | ORAL_TABLET | Freq: Three times a day (TID) | ORAL | Status: DC
  Filled 2020-02-17: qty 1

## 2020-02-17 MED ORDER — TAMSULOSIN HCL 0.4 MG PO CAPS: 0.4000 mg | ORAL_CAPSULE | Freq: Every day | ORAL | Status: DC

## 2020-02-17 NOTE — Progress Notes (Signed)
Patient ID: Dustin Schneider, male   DOB: 07-Feb-1956, 64 y.o.   MRN: [id number] Team Conference Report to Patient/Family  Team Conference discussion was reviewed with the patient and caregiver, including goals, any changes in plan of care and target discharge date.  Patient and caregiver express understanding and are in agreement.  The patient has a target discharge date of 03/02/20.  Dyanne Iha 02/17/2020, 1:58 PM

## 2020-02-17 NOTE — Evaluation (Signed)
Recreational Therapy Assessment and Plan  Patient Details  Name: FRANCISCOJAVIER WRONSKI MRN: [id number] Date of Birth: 1955-11-06 Today's Date: 02/17/2020  Rehab Potential:  Good ELOS:   d/c 7/21  Assessment  Hospital Problem: Principal Problem:   Acute ischemic left MCA stroke [facility])   Past Medical History:      Past Medical History:  Diagnosis Date  . Hypertension   . NICM (nonischemic cardiomyopathy) ([place]) 06/15/2018   04/23/18: EF 45%, 09/15/18: NOrmal LVEF  . Paroxysmal atrial fibrillation ([place]) 06/15/2018  . Visit for monitoring Tikosyn therapy 06/12/2018   Past Surgical History:  Past Surgical History:  Procedure Laterality Date  . CARDIOVERSION N/A 05/13/2018   Procedure: CARDIOVERSION;  Surgeon: [name], MD;  Location: [facility] ENDOSCOPY;  Service: Cardiovascular;  Laterality: N/A;  . CARDIOVERSION N/A 06/13/2018   Procedure: CARDIOVERSION;  Surgeon: [name], MD;  Location: [facility] ENDOSCOPY;  Service: Cardiovascular;  Laterality: N/A;  . CARDIOVERSION N/A 06/23/2018   Procedure: CARDIOVERSION;  Surgeon: [name], MD;  Location: [place];  Service: Cardiovascular;  Laterality: N/A;  . excision of actinic keratosis    . EYE SURGERY     eye lift  . IR CT HEAD LTD  01/27/2020  . IR PERCUTANEOUS ART THROMBECTOMY/INFUSION INTRACRANIAL INC DIAG ANGIO  01/27/2020      . IR PERCUTANEOUS ART THROMBECTOMY/INFUSION INTRACRANIAL INC DIAG ANGIO  01/27/2020  . RADIOLOGY WITH ANESTHESIA N/A 01/27/2020   Procedure: IR WITH ANESTHESIA;  Surgeon: Radiologist, Medication, MD;  Location: [place];  Service: Radiology;  Laterality: N/A;  . TONSILLECTOMY    . WISDOM TOOTH EXTRACTION      Assessment & Plan Clinical Impression:  LARELL BANEY is a 64 year old male with history of PAF- on Xarelto, NICM; who was admitted on 01/27/20 after collapsing at [facility] hardware. He was found to have right sided weakness with left gaze preference as well as N/V. CT head done at admission  revealing hyperdense sign left M2 branch c/w acute thrombus. He underwent cerebral angio with complete recanalization of L-MCA and ACA. 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 2D echo showed EF 55-60% with moderate biatrial dilatation, mild MVR and grade 2 DD. He tolerated extubation on 06/18 and made NPO due to facial weakness with signs of dysphagia and risk for silent aspiration. Patient with dense right hemiplegia with aphasia. Cortak placed and he was started on tube feeds.   He was maintained on ASA given IR petechial hemorrhage and transitioned to Eliquis 06/22. He continues to be limited by right sided weakness with right lean, apraxia with limited verbal output and needs multimodal cues to follow simple commands.   Pt transferred to CIR  6/22.  Pt presents with decreased activity tolerance, decreased functional mobility, decreased balance, decreased coordination, right inattention, expressive aphasia, decreased attention, decreased awareness, decreased problem solving, decreased safety Limiting pt's independence with leisure/community pursuits.  Plan   Min 1 TR session (cotreat with PT)  >30 minutes per week during LOS  Recommendations for other services: Neuropsych  Discharge Criteria: Patient will be discharged from TR if patient refuses treatment 3 consecutive times without medical reason.  If treatment goals not met, if there is a change in medical status, if patient makes no progress towards goals or if patient is discharged from hospital.  The above assessment,  treatment plan, treatment alternatives and goals were discussed and mutually agreed upon: by patient  [place] 02/17/2020, 3:15 PM

## 2020-02-17 NOTE — Progress Notes (Signed)
Speech Language Pathology Weekly Progress and Session Note  Patient Details  Name: Dustin Schneider MRN: [id number] Date of Birth: [birth year]  Beginning of progress report period: February 10, 2020 End of progress report period: February 17, 2020  Today's Date: 02/17/2020 SLP Individual Time: 1305-1404 SLP Individual Time Calculation (min): 59 min  Short Term Goals: Week 2: SLP Short Term Goal 1 (Week 2): Pt will consume current diet with minimal overt s/sx aspiration, efficient mastication and oral clearance Mod I for use of strategies. SLP Short Term Goal 1 - Progress (Week 2): Met SLP Short Term Goal 2 (Week 2): Pt will consume trials of thin liquids with minimal overt s/sx aspiration over 3 sessions prior to repeat MBSS. SLP Short Term Goal 2 - Progress (Week 2): Met SLP Short Term Goal 3 (Week 2): Pt will consume trials of dysphagia 3 (mech soft) solids demonstrating efficient mastication and oral clearance X2 prior to advancement. SLP Short Term Goal 3 - Progress (Week 2): Met SLP Short Term Goal 4 (Week 2): Pt will use multimodal means of communication to express basic wants and needs with Mod A multimodal cues. SLP Short Term Goal 4 - Progress (Week 2): Met SLP Short Term Goal 5 (Week 2): Pt will verbalize at the word level with Mod A multimodal cues. SLP Short Term Goal 5 - Progress (Week 2): Progressing toward goal SLP Short Term Goal 6 (Week 2): Pt will detect functional and/or verbal errors with Mod A multimodal cues. SLP Short Term Goal 6 - Progress (Week 2): Progressing toward goal    New Short Term Goals: Week 3: SLP Short Term Goal 1 (Week 3): Pt will use multimodal means of communication to express basic wants and needs with Min A multimodal cues. SLP Short Term Goal 2 (Week 3): Pt will verbalize at the word level with Mod A multimodal cues. SLP Short Term Goal 3 (Week 3): Pt will detect functional and/or verbal errors with Mod A multimodal cues. SLP Short Term Goal 4 (Week 3):  Pt will consume trials of regular textures with efficient mastication and oral clearance X2 prior to advancement. SLP Short Term Goal 5 (Week 3): Pt will consume thin liquids with minimal overt s/sx aspiration, no signs of respiratory distress, and Mod I for use of swallow precautions. SLP Short Term Goal 6 (Week 3): Pt will demonstrate ability to problem solve basic functional situations with Min A verbal/visual cues.  Weekly Progress Updates: Pt has made functional gains and met 4 out of 6 short term goals. Pt is currently Min-Mod assist for multimodal communication, Max A for verbal communication due to severe expressive aphasia. Pt participated in repeat MBSS 02/16/20 and was upgraded to dysphagia 3, thin liquids, meds whole. Supervision during meals has been reduced to intermittent due to Mod I use of swallow precautions. Pt and family education is ongoing, although family would benefit from further education prior to d/c. Pt would continue to benefit from skilled ST while inpatient in order to maximize functional independence and reduce burden of care prior to discharge. Anticipate that pt will need 24/7 supervision at discharge in addition to [name] follow up at next level of care.       Intensity: Minumum of 1-2 x/day, 30 to 90 minutes Frequency: 3 to 5 out of 7 days Duration/Length of Stay: 03/02/20 Treatment/Interventions: Cognitive remediation/compensation;Cueing hierarchy;Dysphagia/aspiration precaution training;Functional tasks;Patient/family education;Therapeutic Activities;Speech/Language facilitation;Multimodal communication approach;Internal/external aids   Daily Session  Skilled Therapeutic Interventions: Pt was seen for skilled ST targeting  dysphagia and communication goals. [name] brother in law Dustin Schneider) present for session and receptive to education about errorless learning and ways to cue pt in order to facilitated most functional communication exchanges. Pt with remarkable improvements  toward verbalizing at word and phrase level today. During phrase completion tasks, he verbalized at the word level with 80% accuracy with Mod A phonemic and visual (picture) cues. Occasionally pt required written cue, but less frequently in comparison to previous sessions. Pt could extend word level verbalizations to functional phrase with increased Max A multimodal cues with 70% accuracy. [name] vocal intensity also noted to be increased, improving intelligibility to ~85-90%. Pt also counted from 1-10 with 90% accuracy when provided visual cues, when visual (written) cues faded accuracy reduced to 60%. SLP also provided skilled observation of pt consuming recently upgraded thin liquids via cup without any overt s/sx aspiration noted. He used his safe swallow precautions Mod I. Education provided to brother in law regarding progress related to communication and swallow, as well as swallowing recommendations/rationale. Pt left sitting in chair with alarm set and needs within reach, brother still present. Continue per current plan of care.        Pain Pain Assessment Pain Scale: 0-10 Pain Score: 0-No pain  Therapy/Group: Individual Therapy  Dustin Schneider 02/17/2020, 7:24 AM

## 2020-02-17 NOTE — Patient Care Conference (Signed)
Inpatient RehabilitationTeam Conference and Plan of Care Update Date: 02/17/2020   Time: 2:04 PM    Patient Name: Dustin Schneider      Medical Record Number: [id number]  Date of Birth: 28-Oct-1955 Sex: Male         Room/Bed: 4W17C/4W17C-01 Payor Info: Payor: [place] / Plan: [place] PPO / Product Type: *No Product type* /    Admit Date/Time:  02/02/2020  4:11 PM  Primary Diagnosis:  Acute ischemic left MCA stroke [facility])  Hospital Problems: Principal Problem:   Acute ischemic left MCA stroke (HCC) Active Problems:   AKI (acute kidney injury) ([place])   Global aphasia   Leukocytosis   Hypoalbuminemia due to protein-calorie malnutrition (HCC)   Hyperglycemia   Dysphagia, post-stroke   PAF (paroxysmal atrial fibrillation) (HCC)   Urinary retention   Blood pressure increase diastolic   History of hypertension   Drug-induced hypotension   Expressive aphasia   Spastic hemiplegia affecting nondominant side [facility])    Expected Discharge Date: Expected Discharge Date: 03/02/20  Team Members Present: Physician leading conference: Dr. Delice Lesch Care Coodinator Present: Dorien Chihuahua, RN, BSN, CRRN;Christina Sampson Goon, BSW Nurse Present: Isla Pence, RN PT Present: Becky Sax, PT OT Present: Simonne Come, OT SLP Present: Jettie Booze, CF-SLP PPS Coordinator present : Ileana Ladd, Burna Mortimer, SLP     Current Status/Progress Goal Weekly Team Focus  Bowel/Bladder   Pt unable to void, q6 I/O caths. LBM7/6, continent/incontinent of bowel.  continue with caths, will improve in continence  assess toileting q shift and prn   Swallow/Nutrition/ Hydration   dysphagia 3 textures, thin liquids NO STRAWS (upgraded after MBSS 7/6), supervision-Mod I strategies  Mod I  tolerance current diet and indepencece with use of strategies, trials regular textures   ADL's   minA UB dress, mod-maxA LB dress, squat pivots min-modA, dyn sit cga  Supervision dyn sit & groom, CGA  UB dress, b/LB dress & sit<> stand minA  NMR, ADL retraining, dynamic sitting balance, functional transfers   Mobility   bed mobility mod A, squat<>pivot and stand<>pivot transfers mod A, sit<>stand mod A, gait 85ft L handrail max A +2, gait in Litegait 21ft max A +2  min A  functional mobility/transfers, midline orientation, dynamic sitting/standing balance/coordination, gait training, NMR, endurance.   Communication             Safety/Cognition/ Behavioral Observations            Pain   pt no c/o pain  to remain pain free  assess pain q shift and prn   Skin   Incision on left groin, OTA  no skin breakdown, monitor for signs of infection  assess skin q shift and prn     Team Discussion:  Discharge Planning/Teaching Needs:  Goal to discharge home/cont. therapy  Will schedule education with family closer to d/c   Current Update:    Current Barriers to Discharge:  Decreased caregiver support and Incontinence  Possible Resolutions to Barriers: Review burden of care with significant other and son. Medications for urinary retention adjusted by MD and improvement in retention noted.  Patient on target to meet rehab goals: yes, dense hemiparesis and aphasia impairs progress. Spasticity improved significantly with medications added over the weekend.   *See Care Plan and progress notes for long and short-term goals.   Revisions to Treatment Plan:  Encourage more use of gestures and the communication board for limited verbal output improvement.    Medical Summary  Current Status: Right hemiplegia, impaired mobility and ADLs secondary to left MCA ischemic stroke Weekly Focus/Goal: Improve mobility, urinary retention, AKI, afib, spasticity, AKI, BP  Barriers to Discharge: Behavior;Incontinence;Medical stability   Possible Resolutions to Barriers: Therapies, optmize BP meds, follow labs, encourage fluids, optimize bladder meds, follow labs, follow vitals   Continued Need for Acute  Rehabilitation Level of Care: The patient requires daily medical management by a physician with specialized training in physical medicine and rehabilitation for the following reasons: Direction of a multidisciplinary physical rehabilitation program to maximize functional independence : Yes Medical management of patient stability for increased activity during participation in an intensive rehabilitation regime.: Yes Analysis of laboratory values and/or radiology reports with any subsequent need for medication adjustment and/or medical intervention. : Yes   I attest that I was present, lead the team conference, and concur with the assessment and plan of the team.   Dorien Chihuahua B 02/17/2020, 2:04 PM

## 2020-02-17 NOTE — Progress Notes (Signed)
[facility] PHYSICAL MEDICINE & REHABILITATION PROGRESS NOTE  Subjective/Complaints: Patient seen sitting up in his chair working with therapy this morning.  He indicates he slept well overnight.  Discussed tone with therapies.  ROS: Limited due to aphasia, but appears to deny CP, shortness of breath, nausea, vomiting, diarrhea.  Objective: Vital Signs: Blood pressure 103/71, pulse 71, temperature 98.6 F (37 C), temperature source Oral, resp. rate 16, height 5\' 9"  (1.753 m), weight 79.8 kg, SpO2 96 %. DG Swallowing Func-Speech Pathology  Result Date: 02/16/2020 Objective Swallowing Evaluation: Type of Study: MBS-Modified Barium Swallow Study  Patient Details Name: ROMUALDO PROSISE MRN: [id number] Date of Birth: 05/23/1956 Today's Date: 02/16/2020 SLP Time Calculation (min) (ACUTE ONLY): 25 min Past Medical History: Past Medical History: Diagnosis Date . Hypertension  . NICM (nonischemic cardiomyopathy) ([place]) 06/15/2018  04/23/18: EF 45%, 09/15/18: NOrmal LVEF . Paroxysmal atrial fibrillation ([place]) 06/15/2018 . Visit for monitoring Tikosyn therapy 06/12/2018 Past Surgical History: Past Surgical History: Procedure Laterality Date . CARDIOVERSION N/A 05/13/2018  Procedure: CARDIOVERSION;  Surgeon: [name], MD;  Location: [facility] ENDOSCOPY;  Service: Cardiovascular;  Laterality: N/A; . CARDIOVERSION N/A 06/13/2018  Procedure: CARDIOVERSION;  Surgeon: [name], MD;  Location: [facility] ENDOSCOPY;  Service: Cardiovascular;  Laterality: N/A; . CARDIOVERSION N/A 06/23/2018  Procedure: CARDIOVERSION;  Surgeon: [name], MD;  Location: [place];  Service: Cardiovascular;  Laterality: N/A; . excision of actinic keratosis   . EYE SURGERY    eye lift . IR CT HEAD LTD  01/27/2020 . IR PERCUTANEOUS ART THROMBECTOMY/INFUSION INTRACRANIAL INC DIAG ANGIO  01/27/2020    . IR PERCUTANEOUS ART THROMBECTOMY/INFUSION INTRACRANIAL INC DIAG ANGIO  01/27/2020 . RADIOLOGY WITH ANESTHESIA N/A 01/27/2020  Procedure: IR WITH ANESTHESIA;   Surgeon: Radiologist, Medication, MD;  Location: [place];  Service: Radiology;  Laterality: N/A; . TONSILLECTOMY   . WISDOM TOOTH EXTRACTION   HPI: Pt is a 64 yo male presenting iwth acute onset R sided weakness and aphasia. CT showed hyerdensity in the L M2 branch. Pt underwent emergent L MCA and ACA revascularization on 6/16. He was intubated 6/16-6/18. PMH includes: afib, cardiomyopathy, HTN. Pt admitted to [facility] 02/02/20.  Assessment / Plan / Recommendation CHL IP CLINICAL IMPRESSIONS 02/16/2020 Clinical Impression Mild-mod dysphagia still evidenced, however pt with improved oropharyngeal swallow function since last MBSS performed 02/02/20. Although trace lingual/palatal residue resulted in reduced bolus cohesion of thin barium trials, residue was sensed and pt automatically triggered additional swallow to  clear residue without airway intrusion. Trace lateral channel residue present, but when pt took small single cup sips of thin barium, no penetration of aspiration was observed, and swallow initiation was timely. Use of straw to transit thin barium resulted in a less timely swallow, with initiation occurring at the level of the pyriform sinuses, ultimately leading to flash penetration and later frank aspiration of thin with a delayed cough response. Aspirate did appear to be cleared from vestibule in subsequent imaging, however not captured during cough response to definitively determine if it was ejected or simply traveled further down trachea. Pt consumed barium pill with thin via cup with adequate bolus cohesion and no airway intrusion, however would recommend conservative approach to medication administration due to potential for difficulty with dual consistencies given oral phase deficits than can lead to decreased bolus cohesion, increasing aspiration risk. Recommend pt continue dysphagia 3 solids, upgrade to thin liquids - single cup sips only, NO STRAWS, medication may be given whole in puree. ST will continue  to follow to  ensure diet safety, efficiency, and carryover of swallow strategies. SLP Visit Diagnosis Dysphagia, oropharyngeal phase (R13.12) Attention and concentration deficit following -- Frontal lobe and executive function deficit following -- Impact on safety and function Mild aspiration risk   CHL IP TREATMENT RECOMMENDATION 02/02/2020 Treatment Recommendations Therapy as outlined in treatment plan below   Prognosis 01/29/2020 Prognosis for Safe Diet Advancement Good Barriers to Reach Goals Language deficits Barriers/Prognosis Comment -- CHL IP DIET RECOMMENDATION 02/16/2020 SLP Diet Recommendations Dysphagia 3 (Mech soft) solids;Thin liquid Liquid Administration via Cup;No straw Medication Administration Whole meds with puree Compensations Slow rate;Small sips/bites Postural Changes Remain semi-upright after after feeds/meals (Comment);Seated upright at 90 degrees   CHL IP OTHER RECOMMENDATIONS 02/16/2020 Recommended Consults -- Oral Care Recommendations Oral care BID Other Recommendations --   CHL IP FOLLOW UP RECOMMENDATIONS 02/02/2020 Follow up Recommendations Inpatient Rehab   CHL IP FREQUENCY AND DURATION 02/02/2020 Speech Therapy Frequency (ACUTE ONLY) min 2x/week Treatment Duration 2 weeks      CHL IP ORAL PHASE 02/16/2020 Oral Phase Impaired Oral - Pudding Teaspoon -- Oral - Pudding Cup -- Oral - Honey Teaspoon -- Oral - Honey Cup -- Oral - Nectar Teaspoon -- Oral - Nectar Cup NT Oral - Nectar Straw NT Oral - Thin Teaspoon -- Oral - Thin Cup Decreased bolus cohesion;Lingual/palatal residue Oral - Thin Straw Decreased bolus cohesion;Premature spillage Oral - Puree NT Oral - Mech Soft NT Oral - Regular -- Oral - Multi-Consistency -- Oral - Pill WFL Oral Phase - Comment --  CHL IP PHARYNGEAL PHASE 02/16/2020 Pharyngeal Phase Impaired Pharyngeal- Pudding Teaspoon -- Pharyngeal -- Pharyngeal- Pudding Cup -- Pharyngeal -- Pharyngeal- Honey Teaspoon -- Pharyngeal -- Pharyngeal- Honey Cup -- Pharyngeal -- Pharyngeal-  Nectar Teaspoon -- Pharyngeal -- Pharyngeal- Nectar Cup NT Pharyngeal -- Pharyngeal- Nectar Straw NT Pharyngeal -- Pharyngeal- Thin Teaspoon -- Pharyngeal -- Pharyngeal- Thin Cup Lateral channel residue Pharyngeal Material does not enter airway Pharyngeal- Thin Straw Delayed swallow initiation-pyriform sinuses;Reduced airway/laryngeal closure;Penetration/Aspiration before swallow;Inter-arytenoid space residue;Lateral channel residue Pharyngeal Material enters airway, passes BELOW cords and not ejected out despite cough attempt by patient Pharyngeal- Puree NT Pharyngeal -- Pharyngeal- Mechanical Soft NT Pharyngeal -- Pharyngeal- Regular -- Pharyngeal -- Pharyngeal- Multi-consistency -- Pharyngeal -- Pharyngeal- Pill WFL Pharyngeal -- Pharyngeal Comment --  CHL IP CERVICAL ESOPHAGEAL PHASE 02/16/2020 Cervical Esophageal Phase WFL Pudding Teaspoon -- Pudding Cup -- Honey Teaspoon -- Honey Cup -- Nectar Teaspoon -- Nectar Cup -- Nectar Straw -- Thin Teaspoon -- Thin Cup -- Thin Straw -- Puree -- Mechanical Soft -- Regular -- Multi-consistency -- Pill -- Cervical Esophageal Comment -- Arbutus Leas 02/16/2020, 9:57 AM              Recent Labs    02/15/20 0746  WBC 6.5  HGB 13.7  HCT 42.8  PLT 366   Recent Labs    02/15/20 0746  NA 141  K 3.9  CL 106  CO2 25  GLUCOSE 157*  BUN 26*  CREATININE 0.95  CALCIUM 8.7*    Physical Exam: BP 103/71 (BP Location: Left Arm)   Pulse 71   Temp 98.6 F (37 C) (Oral)   Resp 16   Ht 5\' 9"  (1.753 m)   Wt 79.8 kg   SpO2 96%   BMI 25.98 kg/m  Constitutional: No distress . Vital signs reviewed. HENT: Normocephalic.  Atraumatic. Eyes: EOMI. No discharge. Cardiovascular: No JVD.  Irregularly irregular. Respiratory: Normal effort.  No stridor.  Bilaterally clear to auscultation. GI: Non-distended.  BS +. Skin: Warm and dry.  Intact. Psych: Normal mood.  Normal behavior. Musc: No edema in extremities.  No tenderness in extremities. Neuro: Alert Motor:  LUE/LLE: 0/5, stable No increase in tone appreciated  Assessment/Plan: 1. Functional deficits secondary to left MCA infarct which require 3+ hours per day of interdisciplinary therapy in a comprehensive inpatient rehab setting.  Physiatrist is providing close team supervision and 24 hour management of active medical problems listed below.  Physiatrist and rehab team continue to assess barriers to discharge/monitor patient progress toward functional and medical goals  Care Tool:  Bathing    Body parts bathed by patient: Right arm, Chest, Abdomen, Front perineal area, Left upper leg, Face, Right upper leg   Body parts bathed by helper: Left arm     Bathing assist Assist Level: Moderate Assistance - Patient 50 - 74%     Upper Body Dressing/Undressing Upper body dressing   What is the patient wearing?: Pull over shirt    Upper body assist Assist Level: Minimal Assistance - Patient > 75%    Lower Body Dressing/Undressing Lower body dressing      What is the patient wearing?: Pants     Lower body assist Assist for lower body dressing: Moderate Assistance - Patient 50 - 74%     Toileting Toileting    Toileting assist Assist for toileting: 2 Helpers     Transfers Chair/bed transfer  Transfers assist  Chair/bed transfer activity did not occur: Safety/medical concerns  Chair/bed transfer assist level: Moderate Assistance - Patient 50 - 74%     Locomotion Ambulation   Ambulation assist      Assist level: 2 helpers Assistive device: Other (comment) (L handrail) Max distance: 40ft   Walk 10 feet activity   Assist  Walk 10 feet activity did not occur: Safety/medical concerns  Assist level: 2 helpers Assistive device: Other (comment) (L handrail)   Walk 50 feet activity   Assist Walk 50 feet with 2 turns activity did not occur: Safety/medical concerns  Assist level: 2 helpers Assistive device: Lite Gait    Walk 150 feet activity   Assist Walk 150  feet activity did not occur: Safety/medical concerns (R hemi, LE weakness, decreased balance/postural control)         Walk 10 feet on uneven surface  activity   Assist Walk 10 feet on uneven surfaces activity did not occur: Safety/medical concerns (R hemi, LE weakness, decreased balance/postural control)         Wheelchair     Assist Will patient use wheelchair at discharge?: Yes Type of Wheelchair: Manual    Wheelchair assist level: Minimal Assistance - Patient > 75% Max wheelchair distance: 171ft    Wheelchair 50 feet with 2 turns activity    Assist        Assist Level: Minimal Assistance - Patient > 75%   Wheelchair 150 feet activity     Assist     Assist Level: Dependent - Patient 0%      Medical Problem List and Plan: 1. Right hemiplegia, impaired mobility and ADLs secondary to left MCA ischemic stroke  Continue CIR  WHO/PRAFO nightly  Team conference today to discuss current and goals and coordination of care, home and environmental barriers, and discharge planning with nursing, case manager, and therapies.  2.  Antithrombotics: -DVT/anticoagulation:  Pharmaceutical: Other (comment)--Eliquis             -antiplatelet therapy: N/A 3. Pain Management: Tylenol prn.  4. Mood: LCSW to  follow for evaluation and support.              -antipsychotic agents: N/A 5. Neuropsych: This patient is not capable of making decisions on his own behalf.  Trial of amantadine started on 6/26, discussed with therapies, DC'd on 7/1 6. Skin/Wound Care: N/A 7. Fluids/Electrolytes/Nutrition: Monitor I/Os. 8. PAF: Monitor HR tid--has been off Tikosyn since admission. Continue metoprolol bid with Eliquis.   Per cardiology, continue to monitor-no need for Tikosyn at present   Vitals:   02/16/20 2109 02/17/20 0256  BP: 124/82 103/71  Pulse: 72 71  Resp:  16  Temp:  98.6 F (37 C)  SpO2:  96%   Rate controlled on 7/7 9. HTN: Monitor BP--managed on metoprolol    Avapro decreased to 75 on 7/1, decreased to 37.5 on 7/6.    Remain soft on 7/7 10. Dyslipidemia: Continue Crestor. 11.  Post stroke dysphagia:   Advanced to D3, thin liquids  NG DC'd  Advance diet as tolerated  12. Impaired fasting glucose: Likely stress induced  Elevated on 7/5  Continue to monitor 13. Nonischemic cardiomyopathy: outpatient cardiology follow-up. 14.  Hypoalbuminemia  Supplement initiated on 6/23 15.  Leukocytosis: Resolved  WBCs 10.5 on 6/29  Afebrile  Continue to monitor 16.  AKI:   Creatinine 0.95 on 7/5, plan to order labs for later this week  Encourage fluids  Continue to monitor 17. Constipation  Improving 18.  Urinary retention   Flomax started on 6/30  Urecholine started, increased on 7/7  Appears to be improving on 7/6 19.  Spasticity , increased tone noted per PT, knee flexors on Right  Low dose tizanidine started  Appears to have improved  LOS: 15 days A FACE TO FACE EVALUATION WAS PERFORMED  Gusta Marksberry [name] 02/17/2020, 8:11 AM

## 2020-02-17 NOTE — Progress Notes (Signed)
Physical Therapy Weekly Progress Note  Patient Details  Name: Dustin Schneider MRN: [id number] Date of Birth: 07/17/1956  Beginning of progress report period: February 03, 2020 End of progress report period: February 17, 2020  Today's Date: 02/17/2020 PT Individual Time: 1100-1158 PT Individual Time Calculation (min): 58 min   Patient has met 0 of 3 short term goals. However, pt continues to demonstrate gradual improvements towards long term goals. Pt currently requires min/mod A for bed mobility for R UE/LE management, min/mod A for squat<>pivot transfers to L and R, mod A to transfer sit<>stand, max A +2 to ambulate 29f in hallway and to ambulate 639fin LiteGait, and max A +2 to navigate 1 step up. Pt continues to be limited by aphasia and dense R hemiparesis.    Patient continues to demonstrate the following deficits muscle weakness, impaired timing and sequencing, abnormal tone, unbalanced muscle activation, decreased coordination and decreased motor planning and decreased sitting balance, decreased standing balance, decreased postural control, hemiplegia and decreased balance strategies and therefore will continue to benefit from skilled PT intervention to increase functional independence with mobility.  Patient progressing toward long term goals..  Continue plan of care.  PT Short Term Goals Week 2:  PT Short Term Goal 1 (Week 2): Pt will perform bed mobility with min A PT Short Term Goal 1 - Progress (Week 2): Progressing toward goal PT Short Term Goal 2 (Week 2): Pt will transfer bed<>chair with LRAD min A PT Short Term Goal 2 - Progress (Week 2): Progressing toward goal PT Short Term Goal 3 (Week 2): Pt will ambulate 152fith LRAD max A of 1 PT Short Term Goal 3 - Progress (Week 2): Progressing toward goal Week 3:  PT Short Term Goal 1 (Week 3): Pt will perform bed mobility with min A PT Short Term Goal 2 (Week 3): Pt will ambulate 3f64fth LRAD max A of 1 PT Short Term Goal 3 (Week 3):  Pt will navigate 1 step with LRAD max A  Skilled Therapeutic Interventions/Progress Updates:  Ambulation/gait training;Discharge planning;Functional mobility training;Psychosocial support;Therapeutic Activities;Balance/vestibular training;Disease management/prevention;Neuromuscular re-education;Skin care/wound management;Therapeutic Exercise;Wheelchair propulsion/positioning;Cognitive remediation/compensation;DME/adaptive equipment instruction;Pain management;Splinting/orthotics;UE/LE Strength taining/ROM;Community reintegration;Functional electrical stimulation;Patient/family education;Stair training;UE/LE Coordination activities   Today's Interventions: Received pt supine in bed, pt agreeable to therapy, and denied any pain during session. Recreational therapy present during session. Session with emphasis on functional mobility/transfers, generalized strengthening, dynamic standing balance/coordination, community navigation, and improved activity tolerance. Pt transferred supine<>sitting EOB with min A for R LE management. Pt transferred squat<>pivot bed<>WC with mod A with cues to maintain UEs on R leg to facilitate weight bearing through R LE. Pt transported outside to atrium in WC total A. Worked on commGovernment social research officer WC mobility 100ft58f, and >150ft 73fwith min A using R hemi technique over uneven surfaces and concrete. Pt required cues for technique and to scoot hips forward to pull with his L heel. Pt with poor eccentric control navigating WC downhill and required mod A from therapist to avoid running into obstacles and to remain on sidewalk. Worked on transfers to various surfaces/heights and pt transferred squat<>pivot WC<>bench with mod/max A due to improper foot placement mid-transfer. Worked on visual scanning and pt identified various pieces of artwork outside and inside with extensive time spent on communication and word finding regarding artwork, however pt with increased frustration  and ultimately communicating best with yes/no questions. Pt fascinated with reading the history of the [facility] family down in atrium and  worked on dynamic sitting balance and reaching outside BOS to lean forward to read boards and plaques. Pt transported to rehab apartment in [facility] total A and performed furniture transfer onto recliner with max A and cues for sequencing and transfer technique. Pt transported back to room in [facility] total A. Concluded session with pt sitting in WC, needs within reach, and seatbelt alarm on. Hemiplegic arm supported on lap tray.   Therapy Documentation Precautions:  Precautions Precautions: Fall Precaution Comments: pusher, dense R hemiparesis Restrictions Weight Bearing Restrictions: No  Therapy/Group: Individual Therapy Alfonse Alpers PT, DPT   02/17/2020, 7:39 AM

## 2020-02-18 ENCOUNTER — Inpatient Hospital Stay (HOSPITAL_COMMUNITY): Payer: BC Managed Care – PPO | Admitting: Speech Pathology

## 2020-02-18 ENCOUNTER — Inpatient Hospital Stay (HOSPITAL_COMMUNITY): Payer: BC Managed Care – PPO

## 2020-02-18 ENCOUNTER — Ambulatory Visit (HOSPITAL_COMMUNITY): Payer: BC Managed Care – PPO | Admitting: *Deleted

## 2020-02-18 NOTE — Progress Notes (Signed)
Occupational Therapy Session Note  Patient Details  Name: Dustin Schneider MRN: [id number] Date of Birth: Jul 28, 1956  Today's Date: 02/18/2020 OT Individual Time: 0700-0745 OT Individual Time Calculation (min): 45 min    Short Term Goals: Week 2:  Week 3:  OT Short Term Goal 1 (Week 3): Pt will utilize RUE as a stabilizer without verbal cues during a grooming task. OT Short Term Goal 2 (Week 3): Pt will don pants with minA to demonstrate increased dynamic standing balance. OT Short Term Goal 3 (Week 3): Pt will don shirt with minA, carrying over hemi dressing techniques without verbal cues.  Skilled Therapeutic Interventions/Progress Updates:    Pt resting in bed upon arrival eating breakfast. Pt agreeable to getting OOB.  Supine>sit EOB with CGA. Squat pivot transfer to w/c with mod A.  Pt declined bathing but did want to wash face and brush teeth. Setup for both tasks.  Pt wanted to change shirts but declined changing pants.  Pt required min A for donning pull over shirt with max verbal cues for hemi dressing techniques.  Sit<>stand at sink with mod A. Standing balance at sink with min A and support at R knee.  Pt remained seated in w/c with belt alarm activated and all needs within reach.   Therapy Documentation Precautions:  Precautions Precautions: Fall Precaution Comments: pusher, dense R hemiparesis Restrictions Weight Bearing Restrictions: No Pain:  Pt denies pain this morning   Therapy/Group: Individual Therapy  Leroy Libman 02/18/2020, 9:25 AM

## 2020-02-18 NOTE — Progress Notes (Signed)
Patient was toileted & still could not void. He was in/out cathed for 453ml & post scan measured 32ml. No c/o pain or discomfort. Felt a little more resistance on insertion, no evident trauma noted. Will continue to monitor for changes.

## 2020-02-18 NOTE — Progress Notes (Signed)
Recreational Therapy Session Note  Patient Details  Name: Dustin Schneider MRN: [id number] Date of Birth: 1955/09/20 Today's Date: 02/18/2020  Pain: no c/o Skilled Therapeutic Interventions/Progress Updates: Session focused on adapted leisure task, dynamic sitting balance during co-treat with PT.  Pt sat EOM to complete oil painting activity using LUE with supervision/set up assistance.  Pt indicated that he enjoyed this activity.  Therapy/Group: Co-Treatment Quanah Majka 02/18/2020, 12:38 PM

## 2020-02-18 NOTE — Progress Notes (Signed)
Patient ID: Dustin Schneider, male   DOB: 06/14/1956, 64 y.o.   MRN: [id number]   Brother unsure currently of what day he would need to schedule chaplin services for to complete healthcare POA, due to wanting to speak with attorney. Sw will wait for follow up to proceed with chaplin services.

## 2020-02-18 NOTE — Progress Notes (Signed)
Speech Language Pathology Daily Session Note  Patient Details  Name: Dustin Schneider MRN: [id number] Date of Birth: [birth year]  Today's Date: 02/18/2020 SLP Individual Time: 1300-1355 SLP Individual Time Calculation (min): 55 min  Short Term Goals: Week 3: SLP Short Term Goal 1 (Week 3): Pt will use multimodal means of communication to express basic wants and needs with Min A multimodal cues. SLP Short Term Goal 2 (Week 3): Pt will verbalize at the word level with Mod A multimodal cues. SLP Short Term Goal 3 (Week 3): Pt will detect functional and/or verbal errors with Mod A multimodal cues. SLP Short Term Goal 4 (Week 3): Pt will consume trials of regular textures with efficient mastication and oral clearance X2 prior to advancement. SLP Short Term Goal 5 (Week 3): Pt will consume thin liquids with minimal overt s/sx aspiration, no signs of respiratory distress, and Mod I for use of swallow precautions. SLP Short Term Goal 6 (Week 3): Pt will demonstrate ability to problem solve basic functional situations with Min A verbal/visual cues.  Skilled Therapeutic Interventions: Pt was seen for skilled ST targeting communication goals. [name] verbal output continues to increase today. He verbalized object names as actions (-ing verbs)with 80- 90% accuracy when provided Mod A verbal and visual (picture) cues during phrase completion tasks. Phonemic paraphasias present and still inconsistent, but phonemic and written cues mostly successful to aid pt in making corrections. Pt aware of 90% of verbal errors. Pt also able to write his name independently, however only able to partially copy other written words. When provided with multiple choices (written), pt selected correct choices to orient to place, time, and situation. Pt left laying in bed with alarm set and needs within reach. Continue per current plan of care.        Pain Pain Assessment Pain Scale: 0-10 Pain Score: 0-No pain  Therapy/Group:  Individual Therapy  Arbutus Leas 02/18/2020, 7:21 AM

## 2020-02-18 NOTE — Progress Notes (Signed)
[facility] PHYSICAL MEDICINE & REHABILITATION PROGRESS NOTE  Subjective/Complaints: Patient indicates she slept well overnight.  He denies complaints.  ROS: Limited due to aphasia, but appears to deny CP, shortness of breath, nausea, vomiting, diarrhea.  Objective: Vital Signs: Blood pressure 116/69, pulse 81, temperature 98.4 F (36.9 C), temperature source Oral, resp. rate 18, height 5\' 9"  (1.753 m), weight 79.8 kg, SpO2 96 %. No results found. No results for input(s): WBC, HGB, HCT, PLT in the last 72 hours. No results for input(s): NA, K, CL, CO2, GLUCOSE, BUN, CREATININE, CALCIUM in the last 72 hours.  Physical Exam: BP 116/69 (BP Location: Left Arm)    Pulse 81    Temp 98.4 F (36.9 C) (Oral)    Resp 18    Ht 5\' 9"  (1.753 m)    Wt 79.8 kg    SpO2 96%    BMI 25.98 kg/m  Constitutional: No distress . Vital signs reviewed. HENT: Normocephalic.  Atraumatic. Eyes: EOMI. No discharge. Cardiovascular: No JVD. Respiratory: Normal effort.  No stridor. GI: Non-distended. Skin: Warm and dry.  Intact. Psych: Normal mood.  Normal behavior. Musc: No edema in extremities.  No tenderness in extremities. Neuro: Alert Motor: LUE/LLE: 0/5, unchanged No increase in tone appreciated  Assessment/Plan: 1. Functional deficits secondary to left MCA infarct which require 3+ hours per day of interdisciplinary therapy in a comprehensive inpatient rehab setting.  Physiatrist is providing close team supervision and 24 hour management of active medical problems listed below.  Physiatrist and rehab team continue to assess barriers to discharge/monitor patient progress toward functional and medical goals  Care Tool:  Bathing    Body parts bathed by patient: Right arm, Chest, Abdomen, Front perineal area, Left upper leg, Face, Right upper leg   Body parts bathed by helper: Left arm     Bathing assist Assist Level: Moderate Assistance - Patient 50 - 74%     Upper Body Dressing/Undressing Upper  body dressing   What is the patient wearing?: Pull over shirt    Upper body assist Assist Level: Minimal Assistance - Patient > 75%    Lower Body Dressing/Undressing Lower body dressing      What is the patient wearing?: Pants     Lower body assist Assist for lower body dressing: Moderate Assistance - Patient 50 - 74%     Toileting Toileting    Toileting assist Assist for toileting: 2 Helpers     Transfers Chair/bed transfer  Transfers assist  Chair/bed transfer activity did not occur: Safety/medical concerns  Chair/bed transfer assist level: Moderate Assistance - Patient 50 - 74%     Locomotion Ambulation   Ambulation assist      Assist level: 2 helpers Assistive device: Other (comment) (L handrail) Max distance: 74ft   Walk 10 feet activity   Assist  Walk 10 feet activity did not occur: Safety/medical concerns  Assist level: 2 helpers Assistive device: Other (comment) (L handrail)   Walk 50 feet activity   Assist Walk 50 feet with 2 turns activity did not occur: Safety/medical concerns  Assist level: 2 helpers Assistive device: Lite Gait    Walk 150 feet activity   Assist Walk 150 feet activity did not occur: Safety/medical concerns (R hemi, LE weakness, decreased balance/postural control)         Walk 10 feet on uneven surface  activity   Assist Walk 10 feet on uneven surfaces activity did not occur: Safety/medical concerns (R hemi, LE weakness, decreased balance/postural control)  Wheelchair     Assist Will patient use wheelchair at discharge?: Yes Type of Wheelchair: Manual    Wheelchair assist level: Minimal Assistance - Patient > 75% Max wheelchair distance: >123ft    Wheelchair 50 feet with 2 turns activity    Assist        Assist Level: Minimal Assistance - Patient > 75%   Wheelchair 150 feet activity     Assist     Assist Level: Minimal Assistance - Patient > 75%      Medical Problem  List and Plan: 1. Right hemiplegia, impaired mobility and ADLs secondary to left MCA ischemic stroke  Continue CIR  WHO/PRAFO nightly 2.  Antithrombotics: -DVT/anticoagulation:  Pharmaceutical: Other (comment)--Eliquis             -antiplatelet therapy: N/A 3. Pain Management: Tylenol prn.  4. Mood: LCSW to follow for evaluation and support.              -antipsychotic agents: N/A 5. Neuropsych: This patient is not capable of making decisions on his own behalf.  Trial of amantadine started on 6/26, discussed with therapies, DC'd on 7/1 6. Skin/Wound Care: N/A 7. Fluids/Electrolytes/Nutrition: Monitor I/Os. 8. PAF: Monitor HR tid--has been off Tikosyn since admission. Continue metoprolol bid with Eliquis.   Per cardiology, continue to monitor-no need for Tikosyn at present   Vitals:   02/18/20 0359 02/18/20 0820  BP: 95/70 116/69  Pulse: 91 81  Resp: 18   Temp: 98.4 F (36.9 C)   SpO2: 97% 96%   Rate controlled on 7/8 9. HTN: Monitor BP--managed on metoprolol   Avapro decreased to 75 on 7/1, decreased to 37.5 on 7/6.    Remain soft on 7/8 10. Dyslipidemia: Continue Crestor. 11.  Post stroke dysphagia:   Advanced to D3, thin liquids  NG DC'd  Advance diet as tolerated  12. Impaired fasting glucose: Likely stress induced  Elevated on 7/5  Continue to monitor 13. Nonischemic cardiomyopathy: outpatient cardiology follow-up. 14.  Hypoalbuminemia  Supplement initiated on 6/23 15.  Leukocytosis: Resolved  WBCs 10.5 on 6/29  Afebrile  Continue to monitor 16.  AKI:   Creatinine 0.95 on 7/5, labs ordered for tomorrow  Encourage fluids  Continue to monitor 17. Constipation  Improving 18.  Urinary retention   Flomax started on 6/30  Urecholine started, increased on 7/7  Will consider further increase again tomorrow 19.  Spasticity , increased tone noted per PT, knee flexors on Right  Low dose tizanidine started  Improved  LOS: 16 days A FACE TO FACE EVALUATION WAS  PERFORMED  Ruqayyah Lute [name] 02/18/2020, 12:29 PM

## 2020-02-18 NOTE — Progress Notes (Signed)
Patient ID: Dustin Schneider, male   DOB: [birth year], 64 y.o.   MRN: [id number]   Chaplain order put in system
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
## 2020-02-18 NOTE — Progress Notes (Signed)
Patient ID: Dustin Schneider, male   DOB: August 20, 1955, 64 y.o.   MRN: [id number]   Sw followed up with brother to inform him that health care POA is what we can allow to be signed here at hospital with chaplin services.

## 2020-02-18 NOTE — Progress Notes (Signed)
Patient was noted alert & responsive at the beginning of the shift. He has expressive aphasia, but seems to nod his head appropriately to answer questions. No c/o pain on shift. During night time med pass, lopressor was held due to heart rate of 52 & BP of 104/71. His heart rate is irregular as well. No signs of distress noted. No c/o dizziness or SOB. He took his medications by mouth without difficulty. He was encouraged to drink water & did drink most of it, but decline when encouraged to finish. Patient had not voided since earlier today & bladder was scanned prior to the shift. It was below 247ml. Patient was taken to the bathroom reluctantly & still could not void. He was scanned again & volume was 378ml. He was cathed for 447ml using a coude catheter. Slight resistance felt at insertion. No c/o or signs of discomfort during procedure. This morning, he was scanned for 239ml. He seems to have low volumes frequently. Patient does not want to get up right now to void. Will wait a little, encourage some more fluids & see if he can void on the toilet prior to straight cathing him again. Blood pressure was 95/70 & pulse 91 this morning. No acute distress noted. Will continue to monitor for changes.

## 2020-02-19 ENCOUNTER — Inpatient Hospital Stay (HOSPITAL_COMMUNITY): Payer: BC Managed Care – PPO | Admitting: Occupational Therapy

## 2020-02-19 ENCOUNTER — Inpatient Hospital Stay (HOSPITAL_COMMUNITY): Payer: BC Managed Care – PPO

## 2020-02-19 ENCOUNTER — Inpatient Hospital Stay (HOSPITAL_COMMUNITY): Payer: BC Managed Care – PPO | Admitting: Speech Pathology

## 2020-02-19 LAB — BASIC METABOLIC PANEL
Anion gap: 8 (ref 5–15)
BUN: 26 mg/dL — ABNORMAL HIGH (ref 8–23)
CO2: 27 mmol/L (ref 22–32)
Calcium: 9.1 mg/dL (ref 8.9–10.3)
Chloride: 105 mmol/L (ref 98–111)
Creatinine, Ser: 0.99 mg/dL (ref 0.61–1.24)
GFR calc Af Amer: >60 mL/min (ref >60)
GFR calc non Af Amer: >60 mL/min (ref >60)
Glucose, Bld: 87 mg/dL (ref 70–99)
Potassium: 3.9 mmol/L (ref 3.5–5.1)
Sodium: 140 mmol/L (ref 135–145)

## 2020-02-19 MED ORDER — METOPROLOL TARTRATE 25 MG PO TABS
25.0000 mg | ORAL_TABLET | Freq: Two times a day (BID) | ORAL | Status: DC
  Administered 2020-02-20 – 2020-03-02 (×23): 25 mg via ORAL
  Filled 2020-02-19 (×24): qty 1

## 2020-02-19 NOTE — Progress Notes (Signed)
[place] PHYSICAL MEDICINE & REHABILITATION PROGRESS NOTE  Subjective/Complaints: Patient seen sitting up this morning, working with therapies.  He indicates he slept well overnight.  Improvement in speech noted.  He is questions regarding edema..  ROS: Denies CP, shortness of breath, nausea, vomiting, diarrhea.  Objective: Vital Signs: Blood pressure 113/69, pulse 62, temperature 98.1 F (36.7 C), resp. rate 16, height 5\' 9"  (1.753 m), weight 79.8 kg, SpO2 93 %. No results found. No results for input(s): WBC, HGB, HCT, PLT in the last 72 hours. No results for input(s): NA, K, CL, CO2, GLUCOSE, BUN, CREATININE, CALCIUM in the last 72 hours.  Physical Exam: BP 113/69 (BP Location: Left Arm)   Pulse 62   Temp 98.1 F (36.7 C)   Resp 16   Ht 5\' 9"  (1.753 m)   Wt 79.8 kg   SpO2 93%   BMI 25.98 kg/m  Constitutional: No distress . Vital signs reviewed. HENT: Normocephalic.  Atraumatic. Eyes: EOMI. No discharge. Cardiovascular: No JVD. Respiratory: Normal effort.  No stridor. GI: Non-distended. Skin: Warm and dry.  Intact. Psych: Normal mood.  Normal behavior. Musc: Left sided mild edema Neuro: Alert Motor: LUE/LLE: 0/5, stable No increase in tone appreciated  Assessment/Plan: 1. Functional deficits secondary to left MCA infarct which require 3+ hours per day of interdisciplinary therapy in a comprehensive inpatient rehab setting.  Physiatrist is providing close team supervision and 24 hour management of active medical problems listed below.  Physiatrist and rehab team continue to assess barriers to discharge/monitor patient progress toward functional and medical goals  Care Tool:  Bathing    Body parts bathed by patient: Right arm, Chest, Abdomen, Front perineal area, Left upper leg, Face, Right upper leg   Body parts bathed by helper: Left arm     Bathing assist Assist Level: Moderate Assistance - Patient 50 - 74%     Upper Body Dressing/Undressing Upper body  dressing   What is the patient wearing?: Pull over shirt    Upper body assist Assist Level: Minimal Assistance - Patient > 75%    Lower Body Dressing/Undressing Lower body dressing      What is the patient wearing?: Pants     Lower body assist Assist for lower body dressing: Moderate Assistance - Patient 50 - 74%     Toileting Toileting    Toileting assist Assist for toileting: 2 Helpers     Transfers Chair/bed transfer  Transfers assist  Chair/bed transfer activity did not occur: Safety/medical concerns  Chair/bed transfer assist level: Moderate Assistance - Patient 50 - 74%     Locomotion Ambulation   Ambulation assist      Assist level: 2 helpers Assistive device: Other (comment) (L handrail) Max distance: 14ft   Walk 10 feet activity   Assist  Walk 10 feet activity did not occur: Safety/medical concerns  Assist level: 2 helpers Assistive device: Other (comment) (L handrail)   Walk 50 feet activity   Assist Walk 50 feet with 2 turns activity did not occur: Safety/medical concerns  Assist level: 2 helpers Assistive device: Lite Gait    Walk 150 feet activity   Assist Walk 150 feet activity did not occur: Safety/medical concerns (R hemi, LE weakness, decreased balance/postural control)         Walk 10 feet on uneven surface  activity   Assist Walk 10 feet on uneven surfaces activity did not occur: Safety/medical concerns (R hemi, LE weakness, decreased balance/postural control)  Wheelchair     Assist Will patient use wheelchair at discharge?: Yes Type of Wheelchair: Manual    Wheelchair assist level: Minimal Assistance - Patient > 75% Max wheelchair distance: >122ft    Wheelchair 50 feet with 2 turns activity    Assist        Assist Level: Minimal Assistance - Patient > 75%   Wheelchair 150 feet activity     Assist     Assist Level: Minimal Assistance - Patient > 75%      Medical Problem List  and Plan: 1. Right hemiplegia, impaired mobility and ADLs secondary to left MCA ischemic stroke  Continue CIR  WHO/PRAFO nightly 2.  Antithrombotics: -DVT/anticoagulation:  Pharmaceutical: Other (comment)--Eliquis             -antiplatelet therapy: N/A 3. Pain Management: Tylenol prn.  4. Mood: LCSW to follow for evaluation and support.              -antipsychotic agents: N/A 5. Neuropsych: This patient is not capable of making decisions on his own behalf.  Trial of amantadine started on 6/26, discussed with therapies, DC'd on 7/1 6. Skin/Wound Care: N/A 7. Fluids/Electrolytes/Nutrition: Monitor I/Os. 8. PAF: Monitor HR tid--has been off Tikosyn since admission. Continue metoprolol bid with Eliquis.   Per cardiology, continue to monitor-no need for Tikosyn at present   Vitals:   02/19/20 0330 02/19/20 0728  BP: 102/70 113/69  Pulse: (!) 56 62  Resp: 16   Temp: 98.1 F (36.7 C)   SpO2: 98% 93%   Rate controlled on 7/9 9. HTN: Monitor BP--managed on metoprolol   Avapro decreased to 75 on 7/1, decreased to 37.5 on 7/6.    Asymptomatic, soft on 7/9 10. Dyslipidemia: Continue Crestor. 11.  Post stroke dysphagia:   Advanced to D3, thin liquids  NG DC'd  Advance diet as tolerated  12. Impaired fasting glucose: Likely stress induced  Elevated on 7/5  Continue to monitor 13. Nonischemic cardiomyopathy: outpatient cardiology follow-up. 14.  Hypoalbuminemia  Supplement initiated on 6/23 15.  Leukocytosis: Resolved  WBCs 10.5 on 6/29  Afebrile  Continue to monitor 16.  AKI:   Creatinine 0.95 on 7/5, labs pending  Encourage fluids  Continue to monitor 17. Constipation  Improving 18.  Urinary retention   Flomax started on 6/30  Urecholine started, increased on 7/7  Appears to be improving 19.  Spasticity , increased tone noted per PT, knee flexors on Right  Low dose tizanidine started  Improved  LOS: 17 days A FACE TO FACE EVALUATION WAS PERFORMED  Daran Favaro [name] 02/19/2020, 9:59 AM

## 2020-02-19 NOTE — Progress Notes (Signed)
The chaplain responded to a consult for an AD. The chaplain spoke with the family because a notary was not available today. The chaplain will follow-up on Monday when spiritual care's notary has returned.  Brion Aliment Chaplain Resident For questions concerning this note please contact me by pager [phone]

## 2020-02-19 NOTE — Progress Notes (Signed)
Speech Language Pathology Daily Session Note  Patient Details  Name: CYAN MOULTRIE MRN: [id number] Date of Birth: March 07, 1956  Today's Date: 02/19/2020 SLP Individual Time: 1030-1057 SLP Individual Time Calculation (min): 27 min  Short Term Goals: Week 3: SLP Short Term Goal 1 (Week 3): Pt will use multimodal means of communication to express basic wants and needs with Min A multimodal cues. SLP Short Term Goal 2 (Week 3): Pt will verbalize at the word level with Mod A multimodal cues. SLP Short Term Goal 3 (Week 3): Pt will detect functional and/or verbal errors with Mod A multimodal cues. SLP Short Term Goal 4 (Week 3): Pt will consume trials of regular textures with efficient mastication and oral clearance X2 prior to advancement. SLP Short Term Goal 5 (Week 3): Pt will consume thin liquids with minimal overt s/sx aspiration, no signs of respiratory distress, and Mod I for use of swallow precautions. SLP Short Term Goal 6 (Week 3): Pt will demonstrate ability to problem solve basic functional situations with Min A verbal/visual cues.  Skilled Therapeutic Interventions: Pt was seen for skilled ST targeting communication goals. Pt verbalized object names from photographs with 6/10 accuracy in speech production; phonemic paraphasias lead to approximations in unsuccessful attempts. Pt also verbalized at the word level with 4/6 accuracy during phrase closure tasks with Mod A phonetic, semantic, and written cues. Pt left laying in bed with alarm set and needs within reach. Continue per current plan of care.          Pain Pain Assessment Pain Scale: 0-10 Pain Score: 0-No pain  Therapy/Group: Individual Therapy  Arbutus Leas 02/19/2020, 12:50 PM

## 2020-02-19 NOTE — Progress Notes (Signed)
Patient ID: Dustin Schneider, male   DOB: 08/03/1956, 64 y.o.   MRN: [id number]   Sw received call from chaplain services, notary will proceed on Monday with signed documents.

## 2020-02-19 NOTE — Progress Notes (Signed)
Last night, at the beginning of the shift, was told that patient voided. He scanned for <349ml. He was toileted again about 2 hrs later when he motioned that he was full & did void, but his PVR was still 314ml. He was cathed with coude catheter for 555ml. No c/o pain or discomfort. Procedure was tolerated well. During the night, he did not void & was scanned for less than 334ml. He was taken to toilet & voided again. His PVR was 225ml. No acute distress noted.

## 2020-02-19 NOTE — Progress Notes (Signed)
Physical Therapy Note  Patient Details  Name: Dustin Schneider MRN: [id number] Date of Birth: 1956-02-18 Today's Date: 02/19/2020  Recommending the following equipment to increase functional independence with mobility: ~ 18x18 manual WC with regular leg rests   Patient measurements:    Hip Width:17in    Seat Depth (-2"): 17in   W/c cushion: regular 18x18 foam cushion    Alfonse Alpers PT, DPT  02/19/2020, 7:52 AM

## 2020-02-19 NOTE — Progress Notes (Signed)
Physical Therapy Session Note  Patient Details  Name: Dustin Schneider MRN: [id number] Date of Birth: 12-01-1955  Today's Date: 02/19/2020 PT Individual Time: 1300-1400 and 1500-1530  PT Individual Time Calculation (min): 60 min and 30 min  Short Term Goals: Week 2:  PT Short Term Goal 1 (Week 2): Pt will perform bed mobility with min A PT Short Term Goal 1 - Progress (Week 2): Progressing toward goal PT Short Term Goal 2 (Week 2): Pt will transfer bed<>chair with LRAD min A PT Short Term Goal 2 - Progress (Week 2): Progressing toward goal PT Short Term Goal 3 (Week 2): Pt will ambulate 44ft with LRAD max A of 1 PT Short Term Goal 3 - Progress (Week 2): Progressing toward goal Week 3:  PT Short Term Goal 1 (Week 3): Pt will perform bed mobility with min A PT Short Term Goal 2 (Week 3): Pt will ambulate 37ft with LRAD max A of 1 PT Short Term Goal 3 (Week 3): Pt will navigate 1 step with LRAD max A  Skilled Therapeutic Interventions/Progress Updates:   Treatment Session 1: 1300-1400 60 min Received pt supine in bed, pt agreeable to therapy, and denied any pain during session. Session with emphasis on functional mobility/transfers, generalized strengthening, gait training, and improved activity tolerance. Pt transferred supine<>sitting EOB with min A for R LE management and transferred bed<>WC squat<>pivot with mod A with bilateral UE supported on R knee to promote increase weight bearing through R LE. Pt transported to dayroom in [facility] total A for time management purposes. Therapist took measurements to fit pt for 18x18 manual WC. Discussed with pt and OT bathroom set up and sink height for home renovations (need to follow up with pt's brother in law). Donned R DF ace wrap and leg loop to R LE total A and pt ambulated 80ft with 3 musketeer assist using mirror for visual feedback. Pt required total A to advance R LE, facilitate weight bearing through R knee, to block R knee hyperextension/buckling,  and to control R ankle inversion with +2 assist facilitating weight shifting and pelvic rotation. Pt transferred squat<>pivot x 2 additional trials mod A with same technique listed above and donned Maxi Sky harness total A. Pt ambulated 88ft x 2 trials max A +2 using 3 musketeer assist in [employer] body weight support using mirror for visual feedback with same facilitation listed above. Pt transported back to room in [facility] total A. Concluded session with pt sitting in WC, needs within reach, and seatbelt alarm on.   Treatment Session 2: 4098-1191 30 min Received pt supine in bed with RN performing bladder scan, pt agreeable to therapy, and denied any pain during session. Session with emphasis on functional mobility/transfers, toileting, generalized strengthening, dynamic standing balance/coordination, and improved activity tolerance. Pt requested to try using toilet and RN agreed to return afterwards to re-do bladder scan. Pt transferred supine<>sitting EOB with min A and performed squat<>pivot transfer mod A x 2 trials throughout session with cues to avoid compensating with L UE to promote weight bearing through R LE. Pt transferred squat<>pivot WC<>toilet mod A +2 to manage clothing. Pt able to void and required mod A +2 for peri-care and clothing management during squat<>pivot back to WC. Worked on dynamic sitting balance and attention to task having pt identify sculpting equipment (clay and tools) on computer screen. PT communicated with recreational therapist regarding ordering supplies with hopes to practice sculpting next week. Sit<>supine min A and doffed shoes total A. Concluded  session with pt supine in bed, needs within reach, and bed alarm on. RN present performed bladder scan again.   Therapy Documentation Precautions:  Precautions Precautions: Fall Precaution Comments: pusher, dense R hemiparesis Restrictions Weight Bearing Restrictions: No  Therapy/Group: Individual Therapy Alfonse Alpers PT, DPT   02/19/2020, 7:36 AM

## 2020-02-19 NOTE — Progress Notes (Signed)
Occupational Therapy Session Note  Patient Details  Name: Dustin Schneider MRN: [id number] Date of Birth: 1955-11-18  Today's Date: 02/19/2020 OT Individual Time: 9798-9211 OT Individual Time Calculation (min): 67 min    Short Term Goals: Week 3:  OT Short Term Goal 1 (Week 3): Pt will utilize RUE as a stabilizer without verbal cues during a grooming task. OT Short Term Goal 2 (Week 3): Pt will don pants with minA to demonstrate increased dynamic standing balance. OT Short Term Goal 3 (Week 3): Pt will don shirt with minA, carrying over hemi dressing techniques without verbal cues.  Skilled Therapeutic Interventions/Progress Updates:    Treatment session with focus on ADL retraining and functional transfers. Pt received semi-reclined in bed reporting no pain. Engaged in squat pivot transfers to w/c, and TTB with modA facilitation at trunk to increase anterior weight shift and R knee block to prevent RLE hyperextension due to extensor tone. Provided hand over hand assistance to wash LUE with use of wash mit on RUE. Showered with overall minA. Pt able to wash lower legs with use of figure for for LLE and functional reach for RLE. Educated pt on fall safety when engaging in functional reach in the shower. Completed squat pivot transfer TTB to w/c with modA with facilitation at trunk to further anterior weight shift. Pt with increased extensor tone of RLE resulting in posterior lean when completing transfer, requiring modA +2 to facilitate WB into RLE and anterior weight shift to shift pt's hips. Completed oral care seated at sink with RUE supported on sink and table. Facilitated pt's use of RUE as a stabilizer during oral care. Engaged in [place] with max multimodal cues to sequence hemi-dressing techniques, pt would benefit from continued education to increase carryover. Donned pants at w/c with use of step stool and with modA to assist with lifting RLE to thread RLE through pants and to provide  balance assistance, with pt presenting with decreased dynamic standing balance due to increased extensor tone in RLE. Facilitated hand placement to increase success and provided mod multimodal cues for hemi-dressing. Pt unable to cross RLE to complete hemi-dressing, would benefit from exploration of techniques to lift RLE during LB dressing. Donned socks with modA and use of step stool, with pt unable to lift RLE to pull sock over heels. Ended session with pt seated in w/c with lap tray and seat alarm on and all needs within reach.  Therapy Documentation Precautions:  Precautions Precautions: Fall Precaution Comments: pusher, dense R hemiparesis Restrictions Weight Bearing Restrictions: No  Pain:   No pain reported.   Therapy/Group: Individual Therapy  Michelle Nasuti 02/19/2020, 12:01 PM

## 2020-02-20 MED ORDER — BETHANECHOL CHLORIDE 25 MG PO TABS
50.0000 mg | ORAL_TABLET | Freq: Three times a day (TID) | ORAL | Status: DC
  Administered 2020-02-20 – 2020-03-01 (×29): 50 mg via ORAL
  Filled 2020-02-20 (×30): qty 2

## 2020-02-20 NOTE — Progress Notes (Signed)
Scheduled lopressor held last night per order parameters. Right WHO and Right PRAFO applied. At 0035, no void, bladder scan=479cc's, I & O cath=500c's. At 0614, no void, bladder scan=231cc, no I & O cath at this time. Dustin Schneider A

## 2020-02-20 NOTE — Progress Notes (Signed)
PHYSICAL MEDICINE & REHABILITATION PROGRESS NOTE  Subjective/Complaints: Patient seen sitting up in bed this morning eating breakfast.  He states he slept well overnight.  ROS: Denies CP, shortness of breath, nausea, vomiting, diarrhea.  Objective: Vital Signs: Blood pressure 106/81, pulse 89, temperature 98.1 F (36.7 C), resp. rate 18, height 5\' 9"  (1.753 m), weight 79.8 kg, SpO2 98 %. No results found. No results for input(s): WBC, HGB, HCT, PLT in the last 72 hours. Recent Labs    02/19/20 1028  NA 140  K 3.9  CL 105  CO2 27  GLUCOSE 87  BUN 26*  CREATININE 0.99  CALCIUM 9.1    Physical Exam: BP 106/81 (BP Location: Left Arm)   Pulse 89   Temp 98.1 F (36.7 C)   Resp 18   Ht 5\' 9"  (1.753 m)   Wt 79.8 kg   SpO2 98%   BMI 25.98 kg/m  Constitutional: No distress . Vital signs reviewed. HENT: Normocephalic.  Atraumatic. Eyes: EOMI. No discharge. Cardiovascular: No JVD. Respiratory: Normal effort.  No stridor. GI: Non-distended. Skin: Warm and dry.  Intact. Psych: Normal mood.  Normal behavior. Musc: Mild left-sided edema, stable Neuro: Alert Motor: LUE/LLE: 0/5, unchanged No increase in tone appreciated  Assessment/Plan: 1. Functional deficits secondary to left MCA infarct which require 3+ hours per day of interdisciplinary therapy in a comprehensive inpatient rehab setting.  Physiatrist is providing close team supervision and 24 hour management of active medical problems listed below.  Physiatrist and rehab team continue to assess barriers to discharge/monitor patient progress toward functional and medical goals  Care Tool:  Bathing    Body parts bathed by patient: Right arm, Chest, Abdomen, Front perineal area, Left upper leg, Face, Right upper leg, Right lower leg, Left lower leg   Body parts bathed by helper: Left arm, Buttocks     Bathing assist Assist Level: Minimal Assistance - Patient > 75%     Upper Body  Dressing/Undressing Upper body dressing   What is the patient wearing?: Pull over shirt    Upper body assist Assist Level: Minimal Assistance - Patient > 75%    Lower Body Dressing/Undressing Lower body dressing      What is the patient wearing?: Pants     Lower body assist Assist for lower body dressing: Moderate Assistance - Patient 50 - 74%     Toileting Toileting    Toileting assist Assist for toileting: 2 Helpers     Transfers Chair/bed transfer  Transfers assist  Chair/bed transfer activity did not occur: Safety/medical concerns  Chair/bed transfer assist level: Moderate Assistance - Patient 50 - 74%     Locomotion Ambulation   Ambulation assist      Assist level: 2 helpers Assistive device: Maxi Sky Max distance: 102ft   Walk 10 feet activity   Assist  Walk 10 feet activity did not occur: Safety/medical concerns  Assist level: 2 helpers Assistive device: Maxi Sky   Walk 50 feet activity   Assist Walk 50 feet with 2 turns activity did not occur: Safety/medical concerns  Assist level: 2 helpers Assistive device: [employer]    Walk 150 feet activity   Assist Walk 150 feet activity did not occur: Safety/medical concerns (R hemi, LE weakness, decreased balance/postural control)         Walk 10 feet on uneven surface  activity   Assist Walk 10 feet on uneven surfaces activity did not occur: Safety/medical concerns (R hemi, LE weakness, decreased balance/postural control)  Wheelchair     Assist Will patient use wheelchair at discharge?: Yes Type of Wheelchair: Manual    Wheelchair assist level: Minimal Assistance - Patient > 75% Max wheelchair distance: >154ft    Wheelchair 50 feet with 2 turns activity    Assist        Assist Level: Minimal Assistance - Patient > 75%   Wheelchair 150 feet activity     Assist     Assist Level: Minimal Assistance - Patient > 75%      Medical Problem List and  Plan: 1. Right hemiplegia, impaired mobility and ADLs secondary to left MCA ischemic stroke  Continue CIR  WHO/PRAFO nightly  Prolonged discussion with brother-in-law discussing/addressing questions regarding bowel/bladder incontinence, heart rhythm/rate, functional progress, caregiver support with mobility upon discharge, etc. 2.  Antithrombotics: -DVT/anticoagulation:  Pharmaceutical: Other (comment)--Eliquis             -antiplatelet therapy: N/A 3. Pain Management: Tylenol prn.  4. Mood: LCSW to follow for evaluation and support.              -antipsychotic agents: N/A 5. Neuropsych: This patient is not capable of making decisions on his own behalf.  Trial of amantadine started on 6/26, discussed with therapies, DC'd on 7/1 6. Skin/Wound Care: N/A 7. Fluids/Electrolytes/Nutrition: Monitor I/Os. 8. PAF: Monitor HR tid--has been off Tikosyn since admission. Continue metoprolol bid with Eliquis.   Per cardiology, continue to monitor-no need for Tikosyn at present   Vitals:   02/20/20 0616 02/20/20 1433  BP: 105/64 106/81  Pulse: 79 89  Resp: 20 18  Temp: 98.3 F (36.8 C) 98.1 F (36.7 C)  SpO2: 94% 98%   Rate controlled on 7/10 9. HTN: Monitor BP--managed on metoprolol   Avapro decreased to 75 on 7/1, decreased to 37.5 on 7/6.    Asymptomatic, soft on 7/9 10. Dyslipidemia: Continue Crestor. 11.  Post stroke dysphagia:   Advanced to D3, thin liquids  NG DC'd  Advance diet as tolerated  12. Impaired fasting glucose: Resolved  Within normal limits on 7/9  Continue to monitor 13. Nonischemic cardiomyopathy: outpatient cardiology follow-up. 14.  Hypoalbuminemia  Supplement initiated on 6/23 15.  Leukocytosis: Resolved  WBCs 10.5 on 6/29  Afebrile  Continue to monitor 16.  AKI:   Creatinine 0.99 on 7/9, BUN elevated  Encourage fluids  Continue to monitor 17. Constipation  Improving 18.  Urinary retention   Flomax started on 6/30  Urecholine started, increased on 7/7,  increased again on 7/10 19.  Spasticity , increased tone noted per PT, knee flexors on Right  Low dose tizanidine started  Improved  LOS: 18 days A FACE TO FACE EVALUATION WAS PERFORMED  Dustin Schneider Dustin Schneider 02/20/2020, 3:09 PM

## 2020-02-21 ENCOUNTER — Inpatient Hospital Stay (HOSPITAL_COMMUNITY): Payer: BC Managed Care – PPO | Admitting: Occupational Therapy

## 2020-02-21 ENCOUNTER — Inpatient Hospital Stay (HOSPITAL_COMMUNITY): Payer: BC Managed Care – PPO | Admitting: Speech Pathology

## 2020-02-21 ENCOUNTER — Inpatient Hospital Stay (HOSPITAL_COMMUNITY): Payer: BC Managed Care – PPO

## 2020-02-21 NOTE — Progress Notes (Signed)
[place] PHYSICAL MEDICINE & REHABILITATION PROGRESS NOTE  Subjective/Complaints: Patient seen laying in bed this morning.  He states he slept well overnight.  He denies complaints.  ROS: Denies CP, shortness of breath, nausea, vomiting, diarrhea.  Objective: Vital Signs: Blood pressure 118/82, pulse 86, temperature 98.3 F (36.8 C), resp. rate 18, height 5\' 9"  (1.753 m), weight 79.8 kg, SpO2 97 %. No results found. No results for input(s): WBC, HGB, HCT, PLT in the last 72 hours. Recent Labs    02/19/20 1028  NA 140  K 3.9  CL 105  CO2 27  GLUCOSE 87  BUN 26*  CREATININE 0.99  CALCIUM 9.1    Physical Exam: BP 118/82 (BP Location: Left Arm)   Pulse 86   Temp 98.3 F (36.8 C)   Resp 18   Ht 5\' 9"  (1.753 m)   Wt 79.8 kg   SpO2 97%   BMI 25.98 kg/m  Constitutional: No distress . Vital signs reviewed. HENT: Normocephalic.  Atraumatic. Eyes: EOMI. No discharge. Cardiovascular: No JVD. Respiratory: Normal effort.  No stridor. GI: Non-distended. Skin: Warm and dry.  Intact. Psych: Normal mood.  Normal behavior. Musc: Mild left-sided edema Neuro: Alert Motor: LUE/LLE: 0/5, stable No increase in tone appreciated  Assessment/Plan: 1. Functional deficits secondary to left MCA infarct which require 3+ hours per day of interdisciplinary therapy in a comprehensive inpatient rehab setting.  Physiatrist is providing close team supervision and 24 hour management of active medical problems listed below.  Physiatrist and rehab team continue to assess barriers to discharge/monitor patient progress toward functional and medical goals  Care Tool:  Bathing    Body parts bathed by patient: Right arm, Chest, Abdomen, Front perineal area, Left upper leg, Face, Right upper leg, Right lower leg, Left lower leg   Body parts bathed by helper: Left arm, Buttocks     Bathing assist Assist Level: Minimal Assistance - Patient > 75%     Upper Body Dressing/Undressing Upper body  dressing   What is the patient wearing?: Pull over shirt    Upper body assist Assist Level: Minimal Assistance - Patient > 75%    Lower Body Dressing/Undressing Lower body dressing      What is the patient wearing?: Pants     Lower body assist Assist for lower body dressing: Moderate Assistance - Patient 50 - 74%     Toileting Toileting    Toileting assist Assist for toileting: 2 Helpers     Transfers Chair/bed transfer  Transfers assist  Chair/bed transfer activity did not occur: Safety/medical concerns  Chair/bed transfer assist level: Moderate Assistance - Patient 50 - 74%     Locomotion Ambulation   Ambulation assist      Assist level: 2 helpers Assistive device: Maxi Sky Max distance: 65ft   Walk 10 feet activity   Assist  Walk 10 feet activity did not occur: Safety/medical concerns  Assist level: 2 helpers Assistive device: Maxi Sky   Walk 50 feet activity   Assist Walk 50 feet with 2 turns activity did not occur: Safety/medical concerns  Assist level: 2 helpers Assistive device: [employer]    Walk 150 feet activity   Assist Walk 150 feet activity did not occur: Safety/medical concerns (R hemi, LE weakness, decreased balance/postural control)         Walk 10 feet on uneven surface  activity   Assist Walk 10 feet on uneven surfaces activity did not occur: Safety/medical concerns (R hemi, LE weakness, decreased balance/postural control)  Wheelchair     Assist Will patient use wheelchair at discharge?: Yes Type of Wheelchair: Manual    Wheelchair assist level: Minimal Assistance - Patient > 75% Max wheelchair distance: >147ft    Wheelchair 50 feet with 2 turns activity    Assist        Assist Level: Minimal Assistance - Patient > 75%   Wheelchair 150 feet activity     Assist     Assist Level: Minimal Assistance - Patient > 75%      Medical Problem List and Plan: 1. Right hemiplegia, impaired  mobility and ADLs secondary to left MCA ischemic stroke  Continue CIR  WHO/PRAFO nightly 2.  Antithrombotics: -DVT/anticoagulation:  Pharmaceutical: Other (comment)--Eliquis             -antiplatelet therapy: N/A 3. Pain Management: Tylenol prn.  4. Mood: LCSW to follow for evaluation and support.              -antipsychotic agents: N/A 5. Neuropsych: This patient is not capable of making decisions on his own behalf.  Trial of amantadine started on 6/26, discussed with therapies, DC'd on 7/1 6. Skin/Wound Care: N/A 7. Fluids/Electrolytes/Nutrition: Monitor I/Os. 8. PAF: Monitor HR tid--has been off Tikosyn since admission. Continue metoprolol bid with Eliquis.   Per cardiology, continue to monitor-no need for Tikosyn at present   Vitals:   02/20/20 2003 02/21/20 0615  BP: 128/77 118/82  Pulse: (!) 105 86  Resp: 16 18  Temp: 98.6 F (37 C) 98.3 F (36.8 C)  SpO2: 99% 97%   Rate controlled on 7/11 9. HTN: Monitor BP--managed on metoprolol   Avapro decreased to 75 on 7/1, decreased to 37.5 on 7/6.    Relatively controlled on 7/11 10. Dyslipidemia: Continue Crestor. 11.  Post stroke dysphagia:   Advanced to D3, thin liquids  NG DC'd  Advance diet as tolerated  12. Impaired fasting glucose: Resolved  Within normal limits on 7/9  Continue to monitor 13. Nonischemic cardiomyopathy: outpatient cardiology follow-up. 14.  Hypoalbuminemia  Supplement initiated on 6/23 15.  Leukocytosis: Resolved  WBCs 10.5 on 6/29  Afebrile  Continue to monitor 16.  AKI:   Creatinine 0.99 on 7/9, BUN elevated  Encourage fluids  Continue to monitor 17. Constipation  Improving 18.  Urinary retention   Flomax started on 6/30, will consider further increase if blood pressure improves  Urecholine started, increased on 7/7, increased again on 7/10 19.  Spasticity , increased tone noted per PT, knee flexors on Right  Low dose tizanidine started  Improved  LOS: 19 days A FACE TO FACE EVALUATION  WAS PERFORMED  Uvaldo Rybacki [name] 02/21/2020, 1:20 PM

## 2020-02-21 NOTE — Plan of Care (Signed)
  Problem: Consults Goal: RH STROKE PATIENT EDUCATION Description: See Patient Education module for education specifics  Outcome: Progressing   Problem: RH BOWEL ELIMINATION Goal: RH STG MANAGE BOWEL WITH ASSISTANCE Description: STG Manage Bowel with min Assistance. Outcome: Progressing Goal: RH STG MANAGE BOWEL W/MEDICATION W/ASSISTANCE Description: STG Manage Bowel with Medication mod I with Assistance. Outcome: Progressing   Problem: RH BLADDER ELIMINATION Goal: RH STG MANAGE BLADDER WITH ASSISTANCE Description: STG Manage Bladder With min Assistance Outcome: Progressing Goal: RH STG MANAGE BLADDER WITH EQUIPMENT WITH ASSISTANCE Description: STG Manage Bladder With Equipment With min Assistance Outcome: Progressing   Problem: RH SKIN INTEGRITY Goal: RH STG SKIN FREE OF INFECTION/BREAKDOWN Description: Skin will remain free from injury with min assist Outcome: Progressing Goal: RH STG MAINTAIN SKIN INTEGRITY WITH ASSISTANCE Description: STG Maintain Skin Integrity With min Assistance. Outcome: Progressing   Problem: RH SAFETY Goal: RH STG ADHERE TO SAFETY PRECAUTIONS W/ASSISTANCE/DEVICE Description: STG Adhere to Safety Precautions With min Assistance/Device. Outcome: Progressing   Problem: RH COGNITION-NURSING Goal: RH STG ANTICIPATES NEEDS/CALLS FOR ASSIST W/ASSIST/CUES Description: STG Anticipates Needs/Calls for Assist With Assistance/Cues. Outcome: Progressing   Problem: RH PAIN MANAGEMENT Goal: RH STG PAIN MANAGED AT OR BELOW [name] PAIN GOAL Description: Pain scale <4/10 Outcome: Progressing   Problem: RH KNOWLEDGE DEFICIT Goal: RH STG INCREASE KNOWLEDGE OF HYPERTENSION Description: Patient and family will be able to manage HTN with medications, diet using handouts and educational resources independently Outcome: Progressing Goal: RH STG INCREASE KNOWLEDGE OF DYSPHAGIA/FLUID INTAKE Description: Patient and family will be able to manage dysphagia restrictions  independently using educational resources/handouts, etc Outcome: Progressing Goal: RH STG INCREASE KNOWLEDGE OF STROKE PROPHYLAXIS Description: Patient and family will be able to manage secondary stroke prevention managing risk factors including PAF with medications and dietary changes using cues/reminders of education provided Outcome: Progressing   Problem: RH KNOWLEDGE DEFICIT Goal: RH STG INCREASE KNOWLEGDE OF HYPERLIPIDEMIA Description: Patient and family will be able to manage dyslipidemia with medications and dietary changes using handouts/educational materials independently Outcome: Progressing

## 2020-02-21 NOTE — Progress Notes (Signed)
Occupational Therapy Session Note  Patient Details  Name: Dustin Schneider MRN: [id number] Date of Birth: Sep 11, 1955  Today's Date: 02/21/2020 OT Individual Time: 1347-1430 OT Individual Time Calculation (min): 43 min    Short Term Goals: Week 3:  OT Short Term Goal 1 (Week 3): Pt will utilize RUE as a stabilizer without verbal cues during a grooming task. OT Short Term Goal 2 (Week 3): Pt will don pants with minA to demonstrate increased dynamic standing balance. OT Short Term Goal 3 (Week 3): Pt will don shirt with minA, carrying over hemi dressing techniques without verbal cues.  Skilled Therapeutic Interventions/Progress Updates:  Patient met seated in wc in agreement with OT treatment session with focus on functional tranfers, static/dynamic sitting balance during functional tasks, and RUE NMR as detailed below. Patient c/o 0/10 pain at rest and with activity. Total A for wc transfer room<>dayroom for time management. Patient demonstrates squat-pivot transfers x2 wc <> mat table. Attended E-stim with functional activity. Sit<>stand x5 with Mod A and R knee block with additional support at [place]. Session concluded with patient seated in wc with call bell within reach, belt alarm activated, and all needs met.   Therapy Documentation Precautions:  Precautions Precautions: Fall Precaution Comments: pusher, dense R hemiparesis Restrictions Weight Bearing Restrictions: No General:    Therapy/Group: Individual Therapy  Dustin Schneider 02/21/2020, 2:41 PM

## 2020-02-21 NOTE — Progress Notes (Signed)
Physical Therapy Session Note  Patient Details  Name: Dustin Schneider MRN: [id number] Date of Birth: May 08, 1956  Today's Date: 02/21/2020 PT Individual Time: 1105-1200 PT Individual Time Calculation (min): 55 min   Short Term Goals: Week 3:  PT Short Term Goal 1 (Week 3): Pt will perform bed mobility with min A PT Short Term Goal 2 (Week 3): Pt will ambulate 56f with LRAD max A of 1 PT Short Term Goal 3 (Week 3): Pt will navigate 1 step with LRAD max A  Skilled Therapeutic Interventions/Progress Updates: Pt presented in w/c agreeable to therapy. Pt denies pain during session. Pt transported to hallway and participated in gait training for NMR and wt bearing through RLE. Pt ambulated 240fx 3 at wall with ace bandage for DF assist and leg loops for facilitating advancement of RLE. PTA providing total A for advancing RLE, adjusting R foot due to inversion, and blocking R knee. PTA providing manual facilitation of wt shifting to R at hips. Pt requiring verbal cues for decreasing L step length on first bout however improve sequencing and technique on 2nd and 3rd bouts. Pt then transported to rehab gym and performed squat pivot transfer to R with modA. Participated in lateral leans to R onto elbow for wt bearing through RUE. Pt then able to express that he has felt ms in RLE pointing to quad. Pt then participated in static standing with PTA blocking R knee and providing facilitation at hips for wt shifting to R. PTA was able to feel trace activation of R quad with weight shifting. Pt performed total x 4 STS and maintained static stands with wt shfiting for approx 2 min bouts. Performed squat pivot to L to return to w/c with minA. Pt attempted to participate in w/c mobility however chair noted to be too high to permit proper technique, will attempt to obtain hemi-height w/c to continue practicing w/c propulsion. Pt transported back to room at end of session and remained in w/c with belt alarm on, half lap  tray placed, call bell within reach and current needs met.      Therapy Documentation Precautions:  Precautions Precautions: Fall Precaution Comments: pusher, dense R hemiparesis Restrictions Weight Bearing Restrictions: No General:   Vital Signs: Therapy Vitals Temp: 97.7 F (36.5 C) Temp Source: Oral Pulse Rate: 65 Resp: 16 BP: 108/71 Patient Position (if appropriate): Lying Oxygen Therapy SpO2: 99 % O2 Device: Room Air Pain:   Mobility:   Locomotion :    Trunk/Postural Assessment :    Balance:   Exercises:   Other Treatments:      Therapy/Group: Individual Therapy  [name] 02/21/2020, 4:39 PM

## 2020-02-21 NOTE — Progress Notes (Signed)
Speech Language Pathology Daily Session Note  Patient Details  Name: Dustin Schneider MRN: [id number] Date of Birth: [birth year]  Today's Date: 02/21/2020 SLP Individual Time: 0950-1030 SLP Individual Time Calculation (min): 40 min  Short Term Goals: Week 3: SLP Short Term Goal 1 (Week 3): Pt will use multimodal means of communication to express basic wants and needs with Min A multimodal cues. SLP Short Term Goal 2 (Week 3): Pt will verbalize at the word level with Mod A multimodal cues. SLP Short Term Goal 3 (Week 3): Pt will detect functional and/or verbal errors with Mod A multimodal cues. SLP Short Term Goal 4 (Week 3): Pt will consume trials of regular textures with efficient mastication and oral clearance X2 prior to advancement. SLP Short Term Goal 5 (Week 3): Pt will consume thin liquids with minimal overt s/sx aspiration, no signs of respiratory distress, and Mod I for use of swallow precautions. SLP Short Term Goal 6 (Week 3): Pt will demonstrate ability to problem solve basic functional situations with Min A verbal/visual cues.  Skilled Therapeutic Interventions:  Pt was seen for skilled ST targeting goals for functional communication.  Pt was able to complete phrase closure tasks and verbalize at the phrase level when reading a written model with mod assist verbal cues to recognize and correct verbal errors.  SLP also facilitated the session with writing tasks at the word level to address prefunctional skills for multimodal means of communication.  Pt was able to verbally name and write the names of basic objects from around his room with mod assist verbal and written cues.  Pt was left in his wheelchair with chair alarm set and call bell within reach.  Continue per current plan of care.    Pain Pain Assessment Pain Scale: 0-10 Pain Score: 0-No pain  Therapy/Group: Individual Therapy  Georgie Eduardo, Selinda Orion 02/21/2020, 10:35 AM

## 2020-02-21 NOTE — Progress Notes (Signed)
Incontinent BM at HS. At [name], up to BR to void, PVR=286cc's, no cath needed at this time. Dustin Schneider A

## 2020-02-22 ENCOUNTER — Inpatient Hospital Stay (HOSPITAL_COMMUNITY): Payer: BC Managed Care – PPO | Admitting: Speech Pathology

## 2020-02-22 ENCOUNTER — Inpatient Hospital Stay (HOSPITAL_COMMUNITY): Payer: BC Managed Care – PPO | Admitting: Occupational Therapy

## 2020-02-22 ENCOUNTER — Inpatient Hospital Stay (HOSPITAL_COMMUNITY): Payer: BC Managed Care – PPO | Admitting: Physical Therapy

## 2020-02-22 LAB — BASIC METABOLIC PANEL
Anion gap: 8 (ref 5–15)
BUN: 20 mg/dL (ref 8–23)
CO2: 28 mmol/L (ref 22–32)
Calcium: 8.7 mg/dL — ABNORMAL LOW (ref 8.9–10.3)
Chloride: 105 mmol/L (ref 98–111)
Creatinine, Ser: 1.09 mg/dL (ref 0.61–1.24)
GFR calc Af Amer: >60 mL/min (ref >60)
GFR calc non Af Amer: >60 mL/min (ref >60)
Glucose, Bld: 115 mg/dL — ABNORMAL HIGH (ref 70–99)
Potassium: 4 mmol/L (ref 3.5–5.1)
Sodium: 141 mmol/L (ref 135–145)

## 2020-02-22 NOTE — Progress Notes (Signed)
Occupational Therapy Session Note  Patient Details  Name: Dustin Schneider MRN: [id number] Date of Birth: [birth year]  Today's Date: 02/22/2020 OT Individual Time: 5465-6812 OT Individual Time Calculation (min): 60 min    Short Term Goals: Week 3:  OT Short Term Goal 1 (Week 3): Pt will utilize RUE as a stabilizer without verbal cues during a grooming task. OT Short Term Goal 2 (Week 3): Pt will don pants with minA to demonstrate increased dynamic standing balance. OT Short Term Goal 3 (Week 3): Pt will don shirt with minA, carrying over hemi dressing techniques without verbal cues.  Skilled Therapeutic Interventions/Progress Updates:    Treatment session with focus on ADL retraining, functional transfers, and dynamic standing balance in order to increase WB into RLE and improve balance for LB dressing tasks. Pt received on toilet with NT present. Toileted with use of Stedy with full assistance for perineal care and two verbal cues to attend to RUE during standing. Pt performed sit <> stands with Stedy with CGA to prevent R lean, transferred to w/c with Stedy. Pt completed oral care at sink with minimal cues to attend to RUE and sequence use of RUE as stabilizer, with pt initiating use and placing RUE on sink. Demonstrated use of gait belt as leg lift. Donned socks with use of gait belt as leg lift and w/c leg rests as stool, providing minA to support RLE in figure 4 and manage pt use of gait belt as leg lift. Donned shoes with modA due to pt report of fatigue and decreased functional reach and use of RLE. Reviewed AAROM exercises for shoulder of RUE with pt able to recall 2/3 exercises without cueing. Educated on purpose of activity, provided additional visual cues on handout, and modified positioning to support increased safety and efficacy. Completed squat pivot transfer with modA w/c to mat with RLE knee block and facilitation of anterior weight shift at the trunk. Engaged in activity targeting  weight shifting to increase standing EOM through functional reach of LUE laterally and across midline. Provided support of RLE to prevent knee buckling and facilitated at trunk to provide balance support during weight shift. Initially facilitated WB through wrist of RUE, decreased challenge of activity by supporting RUE in extension to increase focus on weight shift. Educated pt on purpose of weight shifting activity to improve dynamic standing balance for functional tasks, and on strategies to support RUE during transfers and during standing. Educated pt on anterior weight shifting during transfers; completed squat pivot transfer with modA facilitating anterior weight shift and supporting RLE. Ended session with pt seated in w/c with lap tray on and seat alarm on.  Therapy Documentation Precautions:  Precautions Precautions: Fall Precaution Comments: pusher, dense R hemiparesis Restrictions Weight Bearing Restrictions: No   Pain:  No pain reported.  Therapy/Group: Individual Therapy  Michelle Nasuti 02/22/2020, 8:32 AM

## 2020-02-22 NOTE — Progress Notes (Signed)
Speech Language Pathology Daily Session Note  Patient Details  Name: Dustin Schneider MRN: [id number] Date of Birth: 1956/02/07  Today's Date: 02/22/2020 SLP Individual Time: 5374-8270 SLP Individual Time Calculation (min): 57 min  Short Term Goals: Week 3: SLP Short Term Goal 1 (Week 3): Pt will use multimodal means of communication to express basic wants and needs with Min A multimodal cues. SLP Short Term Goal 2 (Week 3): Pt will verbalize at the word level with Mod A multimodal cues. SLP Short Term Goal 3 (Week 3): Pt will detect functional and/or verbal errors with Mod A multimodal cues. SLP Short Term Goal 4 (Week 3): Pt will consume trials of regular textures with efficient mastication and oral clearance X2 prior to advancement. SLP Short Term Goal 5 (Week 3): Pt will consume thin liquids with minimal overt s/sx aspiration, no signs of respiratory distress, and Mod I for use of swallow precautions. SLP Short Term Goal 6 (Week 3): Pt will demonstrate ability to problem solve basic functional situations with Min A verbal/visual cues.  Skilled Therapeutic Interventions: Pt was seen for skilled ST targeting dysphagia and communication. SLP facilitated session with upgraded trial of regular texture solids, which pt consumed with demonstration of fully efficient mastication and oral clearance, and Mod I for use of safe swallow strategies. No overt s/sx aspiration observed across thin or regular intake. Recommend upgrade to regular textures, continue thin liquids. [name] brother in law present for session and receptive to education about precautions with thin liquids and medication administration at home. Structured tasks targeting verbal communication skills included various word and phrase level sentence completion tasks and reading at the sentence level. Overall Mod A phonetic and semantic cues provided for pt to verbalize at word and phrase level with 80% accuracy, increased Max A cueing required  for reading tasks, however pt continues to demonstrate excellent progress and good comprehension of reading material based on nonverbal and verbal responses to questions about the content (mostly word level). Pt left sitting in chair with alarm set and needs within reach, brother in law present. Continue per current plan of care.            Pain Pain Assessment Pain Scale: 0-10 Pain Score: 0-No pain  Therapy/Group: Individual Therapy  Dustin Schneider 02/22/2020, 7:18 AM

## 2020-02-22 NOTE — Progress Notes (Signed)
Occupational Therapy Session Note  Patient Details  Name: KEONI RISINGER MRN: [id number] Date of Birth: 09-21-1955  Today's Date: 02/22/2020 OT Individual Time: 1415-1445 OT Individual Time Calculation (min): 30 min    Short Term Goals: Week 3:  OT Short Term Goal 1 (Week 3): Pt will utilize RUE as a stabilizer without verbal cues during a grooming task. OT Short Term Goal 2 (Week 3): Pt will don pants with minA to demonstrate increased dynamic standing balance. OT Short Term Goal 3 (Week 3): Pt will don shirt with minA, carrying over hemi dressing techniques without verbal cues.  Skilled Therapeutic Interventions/Progress Updates:    Patient seated in w/c, sleepy but ready for therapy session.  He indicated that he does not have pain and does not need to use the bathroom at this time.  Ongoing expressive impairment noted but he is able to follow basic directions and express needs via gestures and Y/N questions.  Sit pivot transfer w/c to/from mat table with mod A - cues for breakdown of task, facilitation of right.  He tolerates unsupported sitting with CS and cues for upright posture.  Completed right UE facilitation of proximal musculature in various positions and conditions - note contraction in pecs and lats, consistent scapular mobility.  Elbow extension with righting reaction in stance.  Completed stepping and weight bearing activity with facilitation and support of right side.  He remained in the w/c at close of session with seat belt alarm on and call bell in hand.    Therapy Documentation Precautions:  Precautions Precautions: Fall Precaution Comments: pusher, dense R hemiparesis Restrictions Weight Bearing Restrictions: No   Therapy/Group: Individual Therapy  Carlos Levering 02/22/2020, 7:47 AM

## 2020-02-22 NOTE — Progress Notes (Signed)
Chaplain visited and completed the patient's AD paperwork. It has been signed and notarized. A copy has been included with the patient's physical chart.  Brion Aliment Chaplain Resident For questions concerning this note please contact me by pager [phone]

## 2020-02-22 NOTE — Progress Notes (Signed)
Physical Therapy Session Note  Patient Details  Name: Dustin Schneider MRN: [id number] Date of Birth: [birth year]  Today's Date: 02/22/2020 PT Individual Time: 1111-1209 PT Individual Time Calculation (min): 58 min   Short Term Goals: Week 3:  PT Short Term Goal 1 (Week 3): Pt will perform bed mobility with min A PT Short Term Goal 2 (Week 3): Pt will ambulate 75ft with LRAD max A of 1 PT Short Term Goal 3 (Week 3): Pt will navigate 1 step with LRAD max A  Skilled Therapeutic Interventions/Progress Updates:    Pt received sitting in w/c about to fall asleep with his brother-in-law present but pt agreeable to therapy session.  Transported to/from gym in w/c for time management and energy conservation. R squat pivot w/c>EOM with min assist for pivoting hips - pt demonstrating increasing activation and incorporation of RLE during transfer. Sit<>stands with min/light mod assist for lifting/balance due to R lean - manual facilitation and cuing for increased R LE weightbearing and R knee extension. Pre-gait training targeting R LE stance control via stepping L LE forward/backwards with mod assist for balance - pt demonstrates good R quad muscle activation to prevent knee buckle with min assist. L squat pivot EOM>w/c min assist for pivoting hips. Therapist donned R UE GivMohr sling for support and joint approximation as well as RLE leg lifter to facilitate swing phase gait mechanics. Gait training ~52ft via L HHA with mod/max assist of 1 person and min/mod assist of 2nd - requires min/mod assist for R knee control during stance (slight buckle and occasional hyperextension) and max assist for advancing R LE during swing. Transported to day room.  Standing with L UE support on litegait and min assist of 1 for balance donned harness. Stepped on/off treadmill while in litegait harness with mod assist of 1 person and +2 assist to manage litegait - cuing for sequencing steps and max/total assist for placement of R  LE on/off treadmill. Donned R LE ankle DF ACE wrap and stockinette to facilitate improved swing phase and heel strike on initial contact. Performed the following locomotor treadmill training trials using litegait harness for partial body weight support: 1st trial: 30min19sec at 0.13mph totaling 11ft - mod/max assist for R LE stance control to increase knee extension and max/total assist for advancing R LE during swing - with increased repetitions pt demos increasing quad activation during stance - cuing for reciprocal stepping and longer R LE stance phase with facilitation for R weight shift 2nd trial: 17min28seconds at 0.69mph totaling 222ft - max assist for R LE swing phase with pt able to initiate bringing LE through (but believe he was activating quads as opposed to hip/knee flexors) and mod assist for stance control with pt demonstrating increasing quad activation and learning how to avoid excessive knee flexion vs hyperextension Stepped off treadmill and doffed litegait harness as described above. Removed slings/devices used for gait training and transported pt back to room. Pt agreeable to remain sitting up in w/c while family visiting. Pt left sitting in w/c with needs in reach, seat belt alarm on, and R UE supported on w/c tray.   Therapy Documentation Precautions:  Precautions Precautions: Fall Precaution Comments: pusher, dense R hemiparesis Restrictions Weight Bearing Restrictions: No  Pain:   Nods head "yes" when asked about pain but always nodded head "no" when inquired about interventions to decrease pain or when asked if he wanted to stop the current task.   Therapy/Group: Individual Therapy  Mariusz Jubb Francis Dowse , PT,  DPT, CSRS  02/22/2020, 8:00 AM

## 2020-02-22 NOTE — Progress Notes (Signed)
Dustin Schneider PHYSICAL MEDICINE & REHABILITATION PROGRESS NOTE  Subjective/Complaints: Lying in bed. Had a pretty good night. Doesn't indicate pain.   ROS: limited due to language/communication     Objective: Vital Signs: Blood pressure 104/72, pulse (!) 51, temperature 98.5 F (36.9 C), resp. rate 16, height 5\' 9"  (1.753 m), weight 79.8 kg, SpO2 97 %. No results found. No results for input(s): WBC, HGB, HCT, PLT in the last 72 hours. Recent Labs    02/22/20 0642  NA 141  K 4.0  CL 105  CO2 28  GLUCOSE 115*  BUN 20  CREATININE 1.09  CALCIUM 8.7*    Physical Exam: BP 104/72 (BP Location: Left Arm)   Pulse (!) 51   Temp 98.5 F (36.9 C)   Resp 16   Ht 5\' 9"  (1.753 m)   Wt 79.8 kg   SpO2 97%   BMI 25.98 kg/m  Constitutional: No distress . Vital signs reviewed. HEENT: EOMI, oral membranes moist Neck: supple Cardiovascular: RRR without murmur. No JVD    Respiratory/Chest: CTA Bilaterally without wheezes or rales. Normal effort    GI/Abdomen: BS +, non-tender, non-distended Ext: no clubbing, cyanosis, or edema Psych: pleasant and cooperative Skin: Warm and dry.  Intact. Musc: Mild left-sided edema Neuro: Alert, communicates with y/n head nods. Right central 7 Motor: RUE 0/5, tr-0/5 RLE, stable No increase in tone appreciated  Assessment/Plan: 1. Functional deficits secondary to left MCA infarct which require 3+ hours per day of interdisciplinary therapy in a comprehensive inpatient rehab setting.  Physiatrist is providing close team supervision and 24 hour management of active medical problems listed below.  Physiatrist and rehab team continue to assess barriers to discharge/monitor patient progress toward functional and medical goals  Care Tool:  Bathing    Body parts bathed by patient: Right arm, Chest, Abdomen, Front perineal area, Left upper leg, Face, Right upper leg, Right lower leg, Left lower leg   Body parts bathed by helper: Left arm, Buttocks      Bathing assist Assist Level: Minimal Assistance - Patient > 75%     Upper Body Dressing/Undressing Upper body dressing   What is the patient wearing?: Pull over shirt    Upper body assist Assist Level: Minimal Assistance - Patient > 75%    Lower Body Dressing/Undressing Lower body dressing      What is the patient wearing?: Pants     Lower body assist Assist for lower body dressing: Moderate Assistance - Patient 50 - 74%     Toileting Toileting    Toileting assist Assist for toileting: Dependent - Patient 0%     Transfers Chair/bed transfer  Transfers assist  Chair/bed transfer activity did not occur: Safety/medical concerns  Chair/bed transfer assist level: Moderate Assistance - Patient 50 - 74%     Locomotion Ambulation   Ambulation assist      Assist level: 2 helpers Assistive device: Maxi Sky Max distance: 43ft   Walk 10 feet activity   Assist  Walk 10 feet activity did not occur: Safety/medical concerns  Assist level: 2 helpers Assistive device: Maxi Sky   Walk 50 feet activity   Assist Walk 50 feet with 2 turns activity did not occur: Safety/medical concerns  Assist level: 2 helpers Assistive device: [employer]    Walk 150 feet activity   Assist Walk 150 feet activity did not occur: Safety/medical concerns (R hemi, LE weakness, decreased balance/postural control)         Walk 10 feet on uneven  surface  activity   Assist Walk 10 feet on uneven surfaces activity did not occur: Safety/medical concerns (R hemi, LE weakness, decreased balance/postural control)         Wheelchair     Assist Will patient use wheelchair at discharge?: Yes Type of Wheelchair: Manual    Wheelchair assist level: Minimal Assistance - Patient > 75% Max wheelchair distance: >124ft    Wheelchair 50 feet with 2 turns activity    Assist        Assist Level: Minimal Assistance - Patient > 75%   Wheelchair 150 feet activity     Assist      Assist Level: Minimal Assistance - Patient > 75%      Medical Problem List and Plan: 1. Right hemiplegia, impaired mobility and ADLs secondary to left MCA ischemic stroke  Continue CIR PT, OT, SLP  WHO/PRAFO nightly 2.  Antithrombotics: -DVT/anticoagulation:  Pharmaceutical: Other (comment)--Eliquis             -antiplatelet therapy: N/A 3. Pain Management: Tylenol prn.  4. Mood: LCSW to follow for evaluation and support.              -antipsychotic agents: N/A 5. Neuropsych: This patient is not capable of making decisions on his own behalf.  Trial of amantadine started on 6/26, discussed with therapies, DC'd on 7/1 6. Skin/Wound Care: N/A 7. Fluids/Electrolytes/Nutrition: Monitor I/Os. 8. PAF: Monitor HR tid--has been off Tikosyn since admission. Continue metoprolol bid with Eliquis.   Per cardiology, continue to monitor-no need for Tikosyn at present   Vitals:   02/21/20 1931 02/22/20 0412  BP: 109/74 104/72  Pulse: 82 (!) 51  Resp: 17 16  Temp: 98.4 F (36.9 C) 98.5 F (36.9 C)  SpO2: 98% 97%   7/12 bradycardia, asymptomatic--med adjustment per cards 9. HTN: Monitor BP--managed on metoprolol   Avapro decreased to 75 on 7/1, decreased to 37.5 on 7/6.    7/12 bp's soft, may be able to decrease meds further--observe today 10. Dyslipidemia: Continue Crestor. 11.  Post stroke dysphagia:   Advanced to D3, thin liquids  NG DC'd  Advance diet as tolerated  12. Impaired fasting glucose: Resolved  Within normal limits on 7/9  Continue to monitor 13. Nonischemic cardiomyopathy: outpatient cardiology follow-up. 14.  Hypoalbuminemia  Supplement initiated on 6/23 15.  Leukocytosis: Resolved  WBCs 10.5 on 6/29  Afebrile  Continue to monitor 16.  AKI:   Creatinine 0.99 on 7/9, BUN elevated  Encourage fluids  Continue to monitor 17. Constipation  Improving 18.  Urinary retention   Flomax started on 6/30, will consider further increase if blood pressure  improves  Urecholine started, increased on 7/7, increased again on 7/10  7/12 emptying more completely, pvr's dropping 19.  Spasticity , increased tone noted per PT, knee flexors on Right  Low dose tizanidine started--watch bp  Improved  LOS: 20 days A FACE TO FACE EVALUATION WAS PERFORMED  Meredith Staggers 02/22/2020, 11:51 AM

## 2020-02-23 ENCOUNTER — Inpatient Hospital Stay (HOSPITAL_COMMUNITY): Payer: BC Managed Care – PPO | Admitting: Occupational Therapy

## 2020-02-23 ENCOUNTER — Inpatient Hospital Stay (HOSPITAL_COMMUNITY): Payer: BC Managed Care – PPO | Admitting: Speech Pathology

## 2020-02-23 ENCOUNTER — Inpatient Hospital Stay (HOSPITAL_COMMUNITY): Payer: BC Managed Care – PPO | Admitting: Physical Therapy

## 2020-02-23 NOTE — Progress Notes (Signed)
Physical Therapy Session Note  Patient Details  Name: Dustin Schneider MRN: [id number] Date of Birth: Apr 22, 1956   Today's Date: 02/23/2020 PT Individual Time: 0805-0904 PT Individual Time Calculation (min): 59 min   Short Term Goals: Week 3:  PT Short Term Goal 1 (Week 3): Pt will perform bed mobility with min A PT Short Term Goal 2 (Week 3): Pt will ambulate 45ft with LRAD max A of 1 PT Short Term Goal 3 (Week 3): Pt will navigate 1 step with LRAD max A  Skilled Therapeutic Interventions/Progress Updates:   Pt received sitting in w/c and agreeable to therapy session. Transported to/from gym in w/c for time management and energy conservation. R squat pivot w/c>EOM min/mod assist for pivoting hips - cuing to avoid compensation with L UE. Sit>supine min/mod assist for R hemibody management and cuing for sequencing. Attempted supine heel slides but pt unable to activate hip/knee flexors to perform movement. Supine bridging 2x10 reps with ball squeeze for increased hip adductor activation and increased core muscle activation to level pelvis while lifting/lowering (otherwise pt's knee falling out into abduction and hips unlevel). Supine>sitting L EOB with min/mod assist for R hemibody management. Donned maxi-sky harness in sitting and donned RUE GivMohr sling for joint approximation during standing activities. Sit<>stands, no UE support, with min/mod assist for balance due to R lateral lean - cuing for R knee extension (pt now able to activate the muscle on command) to improve midline orientation.  While in [place] for safety (but not providing body weight support) performed the following without UE support: - R LE stance control and NMR via L LE stepping on/off 6" step - mirror feedback - max multimodal cuing for R weight shift and R glute/quad activation for trunk/LE extension prior to lifting L LE - mod/max manual facilitation for R LE positioning - R LE swing phase NMR via attempts at  tapping R LE on 6" step; however, pt unable to activate hip/knee flexors sufficiently to pick LE off floor requiring total assist/manual facilitation for placing foot on step - when attempting to bring LE off step pt has onset of flexor tone causing his LE to draw up off of the floor having difficulty alternating back to LE extension to place it down and regain standing balance - donned R LE ankle DF ACE wrap and R LE leg lifter for improved facilitation of gait mechanics - gait training ~23ft down/back, no UE support, with max assist of 1 person (+2 present for safety) - mod/max assist for R LE knee control during stance (max multimodal cuing for carryover of stance control retraining with repeating cuing for R weight shift and hip/knee extensor activation prior to stepping L LE), max/total assist for R LE swing phase mechanics with max multimodal cuing for L weight shift but upon pt attempts at swing the flexor tone kicks in causing his foot to draw up off the floor making it difficult for therapist to repositioning it safely  - continues to have wide BOS with poor L weight shift during L stance phase Doffed slings and ACE wraps used for facilitation during session. Squat pivot to w/c with min/mod assist for pivoting hips. Transported back to room and pt left sitting in w/c with needs in reach, seat belt alarm on, and R UE supported on 1/2 lap tray.  Therapy Documentation Precautions:  Precautions Precautions: Fall Precaution Comments: pusher, dense R hemiparesis Restrictions Weight Bearing Restrictions: No  Pain:   Intermittently nods head "yes" when inquired  about pain but pt declines stopping or altering treatment interventions by nodding head "no" when questioned.   Therapy/Group: Individual Therapy  Tawana Scale , PT, DPT, CSRS  02/23/2020, 7:48 AM

## 2020-02-23 NOTE — Plan of Care (Signed)
  Problem: Consults Goal: RH STROKE PATIENT EDUCATION Description: See Patient Education module for education specifics  Outcome: Progressing   Problem: RH BOWEL ELIMINATION Goal: RH STG MANAGE BOWEL WITH ASSISTANCE Description: STG Manage Bowel with min Assistance. Outcome: Progressing Goal: RH STG MANAGE BOWEL W/MEDICATION W/ASSISTANCE Description: STG Manage Bowel with Medication mod I with Assistance. Outcome: Progressing   Problem: RH BLADDER ELIMINATION Goal: RH STG MANAGE BLADDER WITH ASSISTANCE Description: STG Manage Bladder With min Assistance Outcome: Progressing   Problem: RH SKIN INTEGRITY Goal: RH STG SKIN FREE OF INFECTION/BREAKDOWN Description: Skin will remain free from injury with min assist Outcome: Progressing Goal: RH STG MAINTAIN SKIN INTEGRITY WITH ASSISTANCE Description: STG Maintain Skin Integrity With min Assistance. Outcome: Progressing   Problem: RH SAFETY Goal: RH STG ADHERE TO SAFETY PRECAUTIONS W/ASSISTANCE/DEVICE Description: STG Adhere to Safety Precautions With min Assistance/Device. Outcome: Progressing   Problem: RH COGNITION-NURSING Goal: RH STG ANTICIPATES NEEDS/CALLS FOR ASSIST W/ASSIST/CUES Description: STG Anticipates Needs/Calls for Assist With Assistance/Cues. Outcome: Progressing   Problem: RH PAIN MANAGEMENT Goal: RH STG PAIN MANAGED AT OR BELOW [name] PAIN GOAL Description: Pain scale <4/10 Outcome: Progressing   Problem: RH KNOWLEDGE DEFICIT Goal: RH STG INCREASE KNOWLEDGE OF HYPERTENSION Description: Patient and family will be able to manage HTN with medications, diet using handouts and educational resources independently Outcome: Progressing Goal: RH STG INCREASE KNOWLEDGE OF DYSPHAGIA/FLUID INTAKE Description: Patient and family will be able to manage dysphagia restrictions independently using educational resources/handouts, etc Outcome: Progressing Goal: RH STG INCREASE KNOWLEDGE OF STROKE PROPHYLAXIS Description:  Patient and family will be able to manage secondary stroke prevention managing risk factors including PAF with medications and dietary changes using cues/reminders of education provided Outcome: Progressing   Problem: RH KNOWLEDGE DEFICIT Goal: RH STG INCREASE KNOWLEGDE OF HYPERLIPIDEMIA Description: Patient and family will be able to manage dyslipidemia with medications and dietary changes using handouts/educational materials independently Outcome: Progressing

## 2020-02-23 NOTE — Progress Notes (Signed)
Occupational Therapy Session Note  Patient Details  Name: Dustin Schneider MRN: [id number] Date of Birth: 20-Mar-1956  Today's Date: 02/23/2020 OT Individual Time: 4496-7591 OT Individual Time Calculation (min): 74 min    Short Term Goals: Week 3:  OT Short Term Goal 1 (Week 3): Pt will utilize RUE as a stabilizer without verbal cues during a grooming task. OT Short Term Goal 2 (Week 3): Pt will don pants with minA to demonstrate increased dynamic standing balance. OT Short Term Goal 3 (Week 3): Pt will don shirt with minA, carrying over hemi dressing techniques without verbal cues.  Skilled Therapeutic Interventions/Progress Updates:    Treatment session with focus on NMR, dynamic standing balance, and functional transfers. Pt received seated in w/c. Completed squat pivot transfers to mat and w/c with modA, facilitation of anterior weight shift, and support of RLE.Completed dynamic reaching task in sitting with cognitive component, incorporating lateral reach and reach across midline of LUE, facilitating WB through the wrist of RUE at the elbow and wrist. Provided wedge to reduce R lateral trunk flexion. Progressed challenge to complete task in standing, providing modA for sit <> stand and dynamic standing balance, supporting RLE to prevent extension or buckling of RLE. Modified positioning to increase balance challenge and reduce posterior lean to increase WB through RLE. Progressively graded reach. Increased challenge to incorporate stepping of LLE to increase weight shifting and WB through RLE with modA +2. Completed pt AAROM of RUE with light facilitation at the elbow to provide addition support, engaging in functional reach in sitting, reaching forward, laterally, and across midline. Ended session with pt seated in w/c with lap tray on, seat alarm on, and all needs within reach.  Therapy Documentation Precautions:  Precautions Precautions: Fall Precaution Comments: pusher, dense R  hemiparesis Restrictions Weight Bearing Restrictions: No Pain: Pain Assessment Pain Scale: 0-10 Pain Score: 0-No pain   Therapy/Group:  Michelle Nasuti 02/23/2020, 2:54 PM

## 2020-02-23 NOTE — Progress Notes (Signed)
Speech Language Pathology Daily Session Note  Patient Details  Name: Dustin Schneider MRN: [id number] Date of Birth: 08/30/1955  Today's Date: 02/23/2020 SLP Individual Time: 6546-5035 SLP Individual Time Calculation (min): 57 min  Short Term Goals: Week 3: SLP Short Term Goal 1 (Week 3): Pt will use multimodal means of communication to express basic wants and needs with Min A multimodal cues. SLP Short Term Goal 2 (Week 3): Pt will verbalize at the word level with Mod A multimodal cues. SLP Short Term Goal 3 (Week 3): Pt will detect functional and/or verbal errors with Mod A multimodal cues. SLP Short Term Goal 4 (Week 3): Pt will consume trials of regular textures with efficient mastication and oral clearance X2 prior to advancement. SLP Short Term Goal 5 (Week 3): Pt will consume thin liquids with minimal overt s/sx aspiration, no signs of respiratory distress, and Mod I for use of swallow precautions. SLP Short Term Goal 6 (Week 3): Pt will demonstrate ability to problem solve basic functional situations with Min A verbal/visual cues.  Skilled Therapeutic Interventions: Pt was seen for skilled ST targeting communication goals. Pt identified objects by attriubtes from a picture via pointing with 100% accuracy. He verbally named those objects with Mod-Max A sentence completion, phonemic, and intermittently orthographic cues. He was aware of 90% of phonemic errors during productions. He also verbalized object functions in single word and short phrases with same level of cueing. Pt attempted to spontaneously communicate another message related to the items he was naming, and able to write "zipp" which SLP confirmed meant "Zippo" (lighter brand), but unable to further decipher intent behind message despite Max  A attempts to clarify and/or cute. Pt left sitting in chair with alarm set and needs within reach. Continue per current plan of care.        Pain Pain Assessment Pain Scale: 0-10 Pain  Score: 0-No pain  Therapy/Group: Individual Therapy  Arbutus Leas 02/23/2020, 7:26 AM

## 2020-02-23 NOTE — Progress Notes (Addendum)
[place] PHYSICAL MEDICINE & REHABILITATION PROGRESS NOTE  Subjective/Complaints: Patient seen sitting up in his chair this morning.  He indicates he slept well overnight.  He indicates that he wore his orthoses overnight.  ROS: Denies CP, SOB, N/V/D  Objective: Vital Signs: Blood pressure 105/76, pulse 78, temperature 98.3 F (36.8 C), temperature source Oral, resp. rate 16, height 5\' 9"  (1.753 m), weight 79.8 kg, SpO2 95 %. No results found. No results for input(s): WBC, HGB, HCT, PLT in the last 72 hours. Recent Labs    02/22/20 0642  NA 141  K 4.0  CL 105  CO2 28  GLUCOSE 115*  BUN 20  CREATININE 1.09  CALCIUM 8.7*    Physical Exam: BP 105/76 (BP Location: Left Arm)   Pulse 78   Temp 98.3 F (36.8 C) (Oral)   Resp 16   Ht 5\' 9"  (1.753 m)   Wt 79.8 kg   SpO2 95%   BMI 25.98 kg/m  Constitutional: No distress . Vital signs reviewed. HENT: Normocephalic.  Atraumatic. Eyes: EOMI. No discharge. Cardiovascular: No JVD.  Irregularly irregular. Respiratory: Normal effort.  No stridor.  Bilaterally clear to auscultation. GI: Non-distended.  BS +. Skin: Warm and dry.  Intact. Psych: Normal mood.  Normal behavior. Musc: Mild left-sided edema Neuro: Alert Right facial weakness Motor: RUE/RLE: 0/5 proximal to distal  No increase in tone appreciated  Assessment/Plan: 1. Functional deficits secondary to left MCA infarct which require 3+ hours per day of interdisciplinary therapy in a comprehensive inpatient rehab setting.  Physiatrist is providing close team supervision and 24 hour management of active medical problems listed below.  Physiatrist and rehab team continue to assess barriers to discharge/monitor patient progress toward functional and medical goals  Care Tool:  Bathing    Body parts bathed by patient: Right arm, Chest, Abdomen, Front perineal area, Left upper leg, Face, Right upper leg, Right lower leg, Left lower leg   Body parts bathed by helper:  Left arm, Buttocks     Bathing assist Assist Level: Minimal Assistance - Patient > 75%     Upper Body Dressing/Undressing Upper body dressing   What is the patient wearing?: Pull over shirt    Upper body assist Assist Level: Minimal Assistance - Patient > 75%    Lower Body Dressing/Undressing Lower body dressing      What is the patient wearing?: Pants     Lower body assist Assist for lower body dressing: Moderate Assistance - Patient 50 - 74%     Toileting Toileting    Toileting assist Assist for toileting: Dependent - Patient 0%     Transfers Chair/bed transfer  Transfers assist  Chair/bed transfer activity did not occur: Safety/medical concerns  Chair/bed transfer assist level: Minimal Assistance - Patient > 75% (squat pivot)     Locomotion Ambulation   Ambulation assist      Assist level: 2 helpers (mod/max assist of 1 person and min/mod assist of 2nd) Assistive device: Hand held assist Max distance: 77ft   Walk 10 feet activity   Assist  Walk 10 feet activity did not occur: Safety/medical concerns  Assist level: 2 helpers (mod/max assist of 1 person and min/mod assist of 2nd) Assistive device: Hand held assist   Walk 50 feet activity   Assist Walk 50 feet with 2 turns activity did not occur:  (didn't perform turns)  Assist level: 2 helpers Assistive device: [employer]    Walk 150 feet activity   Assist Walk 150 feet  activity did not occur: Safety/medical concerns (R hemi, LE weakness, decreased balance/postural control)         Walk 10 feet on uneven surface  activity   Assist Walk 10 feet on uneven surfaces activity did not occur: Safety/medical concerns (R hemi, LE weakness, decreased balance/postural control)         Wheelchair     Assist Will patient use wheelchair at discharge?: Yes Type of Wheelchair: Manual    Wheelchair assist level: Minimal Assistance - Patient > 75% Max wheelchair distance: >167ft     Wheelchair 50 feet with 2 turns activity    Assist        Assist Level: Minimal Assistance - Patient > 75%   Wheelchair 150 feet activity     Assist     Assist Level: Minimal Assistance - Patient > 75%      Medical Problem List and Plan: 1. Right hemiplegia, impaired mobility and ADLs secondary to left MCA ischemic stroke  Continue CIR   WHO/PRAFO nightly 2.  Antithrombotics: -DVT/anticoagulation:  Pharmaceutical: Other (comment)--Eliquis             -antiplatelet therapy: N/A 3. Pain Management: Tylenol prn.  4. Mood: LCSW to follow for evaluation and support.              -antipsychotic agents: N/A 5. Neuropsych: This patient is not capable of making decisions on his own behalf.  Trial of amantadine started on 6/26, discussed with therapies, DC'd on 7/1 6. Skin/Wound Care: N/A 7. Fluids/Electrolytes/Nutrition: Monitor I/Os. 8. PAF: Monitor HR tid--has been off Tikosyn since admission. Continue metoprolol bid with Eliquis.   Per cardiology, continue to monitor-no need for Tikosyn at present   Vitals:   02/22/20 1957 02/23/20 0553  BP: 134/87 105/76  Pulse: 85 78  Resp: 18 16  Temp: 97.8 F (36.6 C) 98.3 F (36.8 C)  SpO2: 91% 95%   Rate controlled on 7/13 9. HTN: Monitor BP--managed on metoprolol   Avapro decreased to 75 on 7/1, decreased to 37.5 on 7/6.    Soft/relatively controlled on 7/13 10. Dyslipidemia: Continue Crestor. 11.  Post stroke dysphagia:   Advanced to regular thins with accommodations  NG DC'd  Advance diet as tolerated  12. Impaired fasting glucose: Resolved  Within normal limits on 7/9  Continue to monitor 13. Nonischemic cardiomyopathy: outpatient cardiology follow-up. 14.  Hypoalbuminemia  Supplement initiated on 6/23 15.  Leukocytosis: Resolved  WBCs 10.5 on 6/29  Afebrile  Continue to monitor 16.  AKI:   Creatinine 1.09 on 7/12  Encourage fluids  Continue to monitor 17. Constipation  Improving 18.  Urinary  retention   Flomax started on 6/30, will consider further increase if blood pressure improves  Urecholine started, increased on 7/7, increased again on 7/10  Improving 19.  Spasticity , increased tone noted per PT, knee flexors on Right  Low dose tizanidine started--watch bp  Improved  LOS: 21 days A FACE TO FACE EVALUATION WAS PERFORMED  Metta Koranda [name] 02/23/2020, 9:08 AM

## 2020-02-24 ENCOUNTER — Inpatient Hospital Stay (HOSPITAL_COMMUNITY): Payer: BC Managed Care – PPO | Admitting: Physical Therapy

## 2020-02-24 ENCOUNTER — Ambulatory Visit (HOSPITAL_COMMUNITY): Payer: BC Managed Care – PPO | Admitting: Physical Therapy

## 2020-02-24 ENCOUNTER — Inpatient Hospital Stay (HOSPITAL_COMMUNITY): Payer: BC Managed Care – PPO | Admitting: Occupational Therapy

## 2020-02-24 NOTE — Progress Notes (Signed)
Patient ID: Dustin Schneider, male   DOB: Dec 13, 1955, 64 y.o.   MRN: [id number] Team Conference Report to Patient/Family  Team Conference discussion was reviewed with the patient and caregiver, including goals, any changes in plan of care and target discharge date.  Patient and caregiver express understanding and are in agreement.  The patient has a target discharge date of 03/02/20.  Dyanne Iha 02/24/2020, 2:07 PM

## 2020-02-24 NOTE — Progress Notes (Signed)
Occupational Therapy Session Note  Patient Details  Name: Dustin Schneider MRN: [id number] Date of Birth: [birth year]  Today's Date: 02/24/2020 OT Individual Time: 2505-3976 OT Individual Time Calculation (min): 52 min    Short Term Goals: Week 3:  OT Short Term Goal 1 (Week 3): Pt will utilize RUE as a stabilizer without verbal cues during a grooming task. OT Short Term Goal 2 (Week 3): Pt will don pants with minA to demonstrate increased dynamic standing balance. OT Short Term Goal 3 (Week 3): Pt will don shirt with minA, carrying over hemi dressing techniques without verbal cues.  OT Short Term Goal 3 - Progress (Week 3): Progressing toward goal Week 4:  OT Short Term Goal 1 (Week 4): STG = LTG due to ELOS  Skilled Therapeutic Interventions/Progress Updates:    Treatment session with focus on ADL retraining, NMR, and dynamic standing balance. Pt received seated in w/c agreeable to shower. Completed squat pivot transfer w/c to TTB with modA to facilitate anterior weight shift and support balance. Pt doffed clothing with minA at TTB. Showered with modA to facilitate use of RUE with hand over hand assistance with wash mit to wash LUE and torso. Assisted with supporting RLE to wash RLE during shower to reduce functional reach across midline, providing CGA when pt engaged in anterior functional reach. Donned socks with minA due to pt positioning in shower, preventing stabilizing of RLE in figure 4. Completed squat pivot transfer with modA and support at RLE to prevent hyperextension. Pt with posterior bias upon sitting in w/c, facilitated WB into RLE and anterior weight shift to assist with shifting hips back into w/c. Donned shirt at sink with minA to guard RUE resting on table, and with multimodal cues to assist with hemi-dressing technique sequencing. Donned pants with modA for standing balance and to support RLE. Pt initiated use of RUE as a stabilizer during a grooming task. Provided verbal cueing  to modify form to reposition RUE on table. Ended session with pt seated in w/c with lap tray on, seat alarm on, and all needs within reach.  Therapy Documentation Precautions:  Precautions Precautions: Fall Precaution Comments: pusher, dense R hemiparesis Restrictions Weight Bearing Restrictions: No General:   Vital Signs: Therapy Vitals Temp: 98.2 F (36.8 C) Temp Source: Oral Pulse Rate: 65 Resp: 17 BP: 112/78 Patient Position (if appropriate): Lying Oxygen Therapy SpO2: 98 % O2 Device: Room Air Pain:  No paint reported.  Therapy/Group: Individual Therapy  Michelle Nasuti 02/24/2020, 7:16 AM

## 2020-02-24 NOTE — Progress Notes (Signed)
Recreational Therapy Session Note  Patient Details  Name: Dustin Schneider MRN: [id number] Date of Birth: 1956/01/26 Today's Date: 02/24/2020  Pain: no c/o Skilled Therapeutic Interventions/Progress Updates: session focused on activity tolerance, sit->stands, dynamic standing balance, wight shifting, w/c mobility, word finding/expression during co-treat with PT.  Pt propelled w/c on outdoor slightly uneven surfaces with contact guard assist, min cue.  Pt stood with min-mod assist while WBing through RUE with max assist during multiple tabletop tasks.  Pt counting ball rolls 1-10 with minimal phonemic cues during tabletop activity.  Discussed sculpting activity and supplies needed with plans to work on this tomorrow.  Therapy/Group: Co-Treatment   Alaila Pillard 02/24/2020, 3:15 PM

## 2020-02-24 NOTE — Progress Notes (Signed)
[facility] PHYSICAL MEDICINE & REHABILITATION PROGRESS NOTE  Subjective/Complaints: Patient seen sitting up in his chair this morning.  He indicates he slept well overnight.  He indicates he wore his orthoses overnight.  He also shows me his ability to move his right lower extremity.  ROS: Denies CP, SOB, N/V/D  Objective: Vital Signs: Blood pressure 112/78, pulse 65, temperature 98.2 F (36.8 C), temperature source Oral, resp. rate 17, height 5\' 9"  (1.753 m), weight 79.8 kg, SpO2 98 %. No results found. No results for input(s): WBC, HGB, HCT, PLT in the last 72 hours. Recent Labs    02/22/20 0642  NA 141  K 4.0  CL 105  CO2 28  GLUCOSE 115*  BUN 20  CREATININE 1.09  CALCIUM 8.7*    Physical Exam: BP 112/78 (BP Location: Left Arm)   Pulse 65   Temp 98.2 F (36.8 C) (Oral)   Resp 17   Ht 5\' 9"  (1.753 m)   Wt 79.8 kg   SpO2 98%   BMI 25.98 kg/m  Constitutional: No distress . Vital signs reviewed. HENT: Normocephalic.  Atraumatic. Eyes: EOMI. No discharge. Cardiovascular: No JVD.  Irregularly irregular. Respiratory: Normal effort.  No stridor.  Bilaterally clear to auscultation. GI: Non-distended.  BS +. Skin: Warm and dry.  Intact. Psych: Normal mood.  Normal behavior. Musc: Mild left-sided edema.  No tenderness. Neuro: Alert Expressive aphasia Right facial weakness Motor: RUE: 0/5 proximal distal RLE: Hip flexion, knee extension 2+/5, distally 0/5 No increase in tone appreciated  Assessment/Plan: 1. Functional deficits secondary to left MCA infarct which require 3+ hours per day of interdisciplinary therapy in a comprehensive inpatient rehab setting.  Physiatrist is providing close team supervision and 24 hour management of active medical problems listed below.  Physiatrist and rehab team continue to assess barriers to discharge/monitor patient progress toward functional and medical goals  Care Tool:  Bathing    Body parts bathed by patient: Right arm,  Chest, Abdomen, Front perineal area, Left upper leg, Face, Right upper leg, Right lower leg, Left lower leg   Body parts bathed by helper: Left arm, Buttocks     Bathing assist Assist Level: Minimal Assistance - Patient > 75%     Upper Body Dressing/Undressing Upper body dressing   What is the patient wearing?: Pull over shirt    Upper body assist Assist Level: Minimal Assistance - Patient > 75%    Lower Body Dressing/Undressing Lower body dressing      What is the patient wearing?: Pants     Lower body assist Assist for lower body dressing: Moderate Assistance - Patient 50 - 74%     Toileting Toileting    Toileting assist Assist for toileting: Dependent - Patient 0%     Transfers Chair/bed transfer  Transfers assist  Chair/bed transfer activity did not occur: Safety/medical concerns  Chair/bed transfer assist level: Moderate Assistance - Patient 50 - 74% (squat pivot)     Locomotion Ambulation   Ambulation assist      Assist level: 2 helpers (max A 1 and +2 for safety) Assistive device: Maxi Sky Max distance: 42ft   Walk 10 feet activity   Assist  Walk 10 feet activity did not occur: Safety/medical concerns  Assist level: 2 helpers (max A 1 and +2 for safety) Assistive device: Maxi Sky   Walk 50 feet activity   Assist Walk 50 feet with 2 turns activity did not occur:  (didn't perform turns)  Assist level: 2 helpers Assistive  device: Maxi Sky    Walk 150 feet activity   Assist Walk 150 feet activity did not occur: Safety/medical concerns (R hemi, LE weakness, decreased balance/postural control)         Walk 10 feet on uneven surface  activity   Assist Walk 10 feet on uneven surfaces activity did not occur: Safety/medical concerns (R hemi, LE weakness, decreased balance/postural control)         Wheelchair     Assist Will patient use wheelchair at discharge?: Yes Type of Wheelchair: Manual    Wheelchair assist level:  Minimal Assistance - Patient > 75% Max wheelchair distance: >126ft    Wheelchair 50 feet with 2 turns activity    Assist        Assist Level: Minimal Assistance - Patient > 75%   Wheelchair 150 feet activity     Assist     Assist Level: Minimal Assistance - Patient > 75%      Medical Problem List and Plan: 1. Right hemiplegia, impaired mobility and ADLs secondary to left MCA ischemic stroke  Continue CIR   Team conference today to discuss current and goals and coordination of care, home and environmental barriers, and discharge planning with nursing, case manager, and therapies.   WHO/PRAFO nightly 2.  Antithrombotics: -DVT/anticoagulation:  Pharmaceutical: Other (comment)--Eliquis             -antiplatelet therapy: N/A 3. Pain Management: Tylenol prn.  4. Mood: LCSW to follow for evaluation and support.              -antipsychotic agents: N/A 5. Neuropsych: This patient is not capable of making decisions on his own behalf.  Trial of amantadine started on 6/26, discussed with therapies, DC'd on 7/1 6. Skin/Wound Care: N/A 7. Fluids/Electrolytes/Nutrition: Monitor I/Os. 8. PAF: Monitor HR tid--has been off Tikosyn since admission. Continue metoprolol bid with Eliquis.   Per cardiology, continue to monitor-no need for Tikosyn at present   Vitals:   02/23/20 2017 02/24/20 0446  BP: 104/69 112/78  Pulse: 73 65  Resp: 18 17  Temp: 98.7 F (37.1 C) 98.2 F (36.8 C)  SpO2: 96% 98%   Rate controlled on 7/14 9. HTN: Monitor BP--managed on metoprolol   Avapro decreased to 75 on 7/1, decreased to 37.5 on 7/6, DC'd on 7/15  Soft/relatively controlled on 7/14 10. Dyslipidemia: Continue Crestor. 11.  Post stroke dysphagia:   Advanced to regular thins with accommodations  NG DC'd  Advance diet as tolerated  12. Impaired fasting glucose: Resolved  Within normal limits on 7/9  Continue to monitor 13. Nonischemic cardiomyopathy: outpatient cardiology follow-up. 14.   Hypoalbuminemia  Supplement initiated on 6/23 15.  Leukocytosis: Resolved  WBCs 10.5 on 6/29  Afebrile  Continue to monitor 16.  AKI:   Creatinine 1.09 on 7/12, will order labs for later this week  Encourage fluids  Continue to monitor 17. Constipation  Improving 18.  Urinary retention   Flomax started on 6/30, will consider further increase if blood pressure improves  Urecholine started, increased on 7/7, increased again on 7/10  Improvement 19.  Spasticity , increased tone noted per PT, knee flexors on Right  Low dose tizanidine started--watch bp  Improved  LOS: 22 days A FACE TO FACE EVALUATION WAS PERFORMED  Sherrell Farish [name] 02/24/2020, 8:50 AM

## 2020-02-24 NOTE — Progress Notes (Signed)
Patient ID: Dustin Schneider, male   DOB: [birth year], 64 y.o.   MRN: [id number]   Sw presented disability leave paperwork for patient. Will have PA fill and will return to significant other ([name]).

## 2020-02-24 NOTE — Progress Notes (Signed)
Physical Therapy Session Note  Patient Details  Name: Dustin Schneider MRN: [id number] Date of Birth: 08-17-1955  Today's Date: 02/24/2020 PT Individual Time: 1400-1513 PT Individual Time Calculation (min): 73 min   Short Term Goals: Week 3:  PT Short Term Goal 1 (Week 3): Pt will perform bed mobility with min A PT Short Term Goal 2 (Week 3): Pt will ambulate 83ft with LRAD max A of 1 PT Short Term Goal 3 (Week 3): Pt will navigate 1 step with LRAD max A  Skilled Therapeutic Interventions/Progress Updates: Pt presented in w/c agreeable to therapy. Pt denies pain at start of session. Therapeutic breaks provided throughout session as needed. PTA exchanged pt's w/c to hemi height to allow w/c propulsion during session. Pt transported to rehab gym for energy conservation and participated in squat pivot minA to mat to allow PTA to exchange w/c. Pt then participated in gait for forced use of RLE. Pt ambulated 10ft x 3 with modA with pt requiring total A for RLE advancement. PTA noted increased quad recruitment this session then the last time seen by this therapist. Pt then transferred to mat and participated in toe taps 2 x 10 with LLE for forced wt on RLE then 2 x 5 AA on RLE. Pt was able to recruit enough to place onto 2in step then PTA returned foot to level tile. Pt then performed STS 2x 5 with LLE on 2in step with PTA facilitating wt shfiting to R and stabilizing R knee to promote increased recruitment of RLE. Participated in scooting L/R without use of LUE for forced use of RLE as well encouraging decreased compensatory use of LUE. Pt then transferred to w/c with minA and participated in w/c propulsion to nsg station and through day room with supervision overall and demonstrated good safety neogotiating obstacles. Pt propelled back to room at end of session and remained in w/c. Pt left with belt alarm on, half lap tray in place, and call bell within reach.      Therapy Documentation Precautions:   Precautions Precautions: Fall Precaution Comments: pusher, dense R hemiparesis Restrictions Weight Bearing Restrictions: No General:   Vital Signs: Therapy Vitals Temp: 98.5 F (36.9 C) Pulse Rate: 86 Resp: 17 BP: 106/78 Patient Position (if appropriate): Sitting Oxygen Therapy SpO2: 96 % O2 Device: Room Air Pain:   Mobility:   Locomotion :    Trunk/Postural Assessment :    Balance:   Exercises:   Other Treatments:      Therapy/Group: Individual Therapy  Hawraa Stambaugh 02/24/2020, 5:15 PM

## 2020-02-24 NOTE — Progress Notes (Signed)
Speech Language Pathology Weekly Progress Note  Patient Details  Name: Dustin Schneider MRN: [id number] Date of Birth: 03-02-1956  Beginning of progress report period: February 17, 2020 End of progress report period: February 24, 2020   Short Term Goals: Week 3: SLP Short Term Goal 1 (Week 3): Pt will use multimodal means of communication to express basic wants and needs with Min A multimodal cues. SLP Short Term Goal 1 - Progress (Week 3): Met SLP Short Term Goal 2 (Week 3): Pt will verbalize at the word level with Mod A multimodal cues. SLP Short Term Goal 2 - Progress (Week 3): Met SLP Short Term Goal 3 (Week 3): Pt will detect functional and/or verbal errors with Mod A multimodal cues. SLP Short Term Goal 3 - Progress (Week 3): Met SLP Short Term Goal 4 (Week 3): Pt will consume trials of regular textures with efficient mastication and oral clearance X2 prior to advancement. SLP Short Term Goal 4 - Progress (Week 3): Met SLP Short Term Goal 5 (Week 3): Pt will consume thin liquids with minimal overt s/sx aspiration, no signs of respiratory distress, and Mod I for use of swallow precautions. SLP Short Term Goal 5 - Progress (Week 3): Met SLP Short Term Goal 6 (Week 3): Pt will demonstrate ability to problem solve basic functional situations with Min A verbal/visual cues. SLP Short Term Goal 6 - Progress (Week 3): Met    New Short Term Goals: Week 4: SLP Short Term Goal 1 (Week 4): STG=LTG due to remaining length of stay  Weekly Progress Updates: Pt has made functional gains and met 6 out of 6 short term goals. Pt is currently Mod assist for verbal expression, Min A for mulitmodal communication via gestures and a word communication board due to expressive aphasia (Broca's). He has demonstrated vast improvements in verbal expression at word and short phrase level over the last week, and is becoming more responsive to semantic and phonemic cueing. He still requires orthographic (written) cues at  times. He also requires Supervision-Min A for basic problem solving and emergent awareness of functional and verbal errors. Pt is consuming and an upgraded regular texture diet with thin liquids and is Mod I for use of safe swallow precautions; he only required set up and intermittent supervision with meals now. Pt and family education is ongoing. Pt would continue to benefit from skilled ST while inpatient in order to maximize functional independence and reduce burden of care prior to discharge. Anticipate that pt will need 24/7 supervision at discharge in addition to [place] follow up at next level of care.      Intensity: Minumum of 1-2 x/day, 30 to 90 minutes Frequency: 3 to 5 out of 7 days Duration/Length of Stay: 03/02/20 Treatment/Interventions: Cognitive remediation/compensation;Cueing hierarchy;Dysphagia/aspiration precaution training;Functional tasks;Patient/family education;Therapeutic Activities;Speech/Language facilitation;Multimodal communication approach;Internal/external aids   Arbutus Leas 02/24/2020, 7:19 AM

## 2020-02-24 NOTE — Progress Notes (Signed)
Pt denies any pain. Pt up in chair. Appetite is good. Continuing to monitor PVR's. Pt was bladder scanned after use of BR however pt needed to have a BM and urinal was not used at this time. PVR was 17ml

## 2020-02-24 NOTE — Patient Care Conference (Signed)
Inpatient RehabilitationTeam Conference and Plan of Care Update Date: 02/24/2020   Time: 3:07 PM    Patient Name: Dustin Schneider      Medical Record Number: [id number]  Date of Birth: Feb 01, 1956 Sex: Male         Room/Bed: 4W17C/4W17C-01 Payor Info: Payor: [place] / Plan: [place] PPO / Product Type: *No Product type* /    Admit Date/Time:  02/02/2020  4:11 PM  Primary Diagnosis:  Acute ischemic left MCA stroke [facility])  Hospital Problems: Principal Problem:   Acute ischemic left MCA stroke ([place]) Active Problems:   AKI (acute kidney injury) ([place])   Global aphasia   Leukocytosis   Hypoalbuminemia due to protein-calorie malnutrition ([facility])   Hyperglycemia   Dysphagia, post-stroke   PAF (paroxysmal atrial fibrillation) ([place])   Urinary retention   Blood pressure increase diastolic   History of hypertension   Drug-induced hypotension   Expressive aphasia   Spastic hemiplegia affecting nondominant side [facility])    Expected Discharge Date: Expected Discharge Date: 03/02/20  Team Members Present: Physician leading conference: Dr. Delice Lesch Care Coodinator Present: Dorien Chihuahua, RN, BSN, CRRN;Christina Bird-in-Hand, [place] Nurse Present: Other (comment) Abigail Miyamoto, RN) PT Present: Excell Seltzer, PT OT Present: Simonne Come, OT SLP Present: Weston Anna, SLP PPS Coordinator present : Gunnar Fusi, SLP     Current Status/Progress Goal Weekly Team Focus  Bowel/Bladder   patient is voiding with <350 left in bladder at PVR, continent of bowel, LBM 7/13  ability to empty the blasdder  continue to cath as ordered giving time to feel urgency & scan a needed   Swallow/Nutrition/ Hydration   regular textures, thin liquids NO STRAWS, Mod I, intermittent supervision  Mod i  tolerance upgraded diet, education with family regarding liquids   ADL's   minA UB dress, modA LB dress, minA bathing, squat pivots min-modA  Supervision dyn sit & groom, CGA UB dress, b/LB dress  & sit<> stand minA  NMR, ADL retraining, dynamic sitting balance, standing balance, functional transfers   Mobility   min/mod assist bed mobility, min/mod assist squat pivot transfers, mod assist sit<>stands, +2 mod assist gait via L HHA and R UE supported in GivMohr sling up to 48ft - requires max assist for R LE stance control and total assist for R LE swing phase advancement  min assist overall  transfer training, R LE NMR and weightbearing, cardiovascular endurance, dynamic standing balance, gait training, pt education, discharge planning   Communication   Improved overall verbal output, Mod A word short phrase level, Min-Mod A communication via multimodal means  Min  verbalizing word and phrase level, reading and writing, multimodal communication   Safety/Cognition/ Behavioral Observations  Supervision-Min  Supervision  emergent awareness, basic functional problem solving   Pain   no c/o pain, has tylenol prn  pain scale <2/10  assess & treat as needed   Skin   incision healed to groin, no new areas of skin break down  no new areas of skin break down  assess q shift     Team Discussion:  Discharge Planning/Teaching Needs:  Goal to discharge home/cont. therapy  Will schedule education with family closer to d/c   Current Update: on target  Current Barriers to Discharge:  Decreased caregiver support  Possible Resolutions to Barriers: Couple of sessions with family/caregivers before discharge to review care needed and level of assistance  Patient on target to meet rehab goals: yes  *See Care Plan and progress  notes for long and short-term goals.   Revisions to Treatment Plan:      Medical Summary Current Status: Right hemiplegia, impaired mobility and ADLs secondary to left MCA ischemic stroke Weekly Focus/Goal: Improve mobility, urinary retention, AKI, BP  Barriers to Discharge: Incontinence;Medical stability   Possible Resolutions to Barriers: Therapies, optmize BP meds,  follow labs, encourage fluids, optimize bladder meds, follow vitals   Continued Need for Acute Rehabilitation Level of Care: The patient requires daily medical management by a physician with specialized training in physical medicine and rehabilitation for the following reasons: Direction of a multidisciplinary physical rehabilitation program to maximize functional independence : Yes Medical management of patient stability for increased activity during participation in an intensive rehabilitation regime.: Yes Analysis of laboratory values and/or radiology reports with any subsequent need for medication adjustment and/or medical intervention. : Yes   I attest that I was present, lead the team conference, and concur with the assessment and plan of the team.   Dorien Chihuahua B 02/24/2020, 3:07 PM

## 2020-02-24 NOTE — Progress Notes (Signed)
Physical Therapy Session Note  Patient Details  Name: Dustin Schneider MRN: [id number] Date of Birth: 1955/12/25  Today's Date: 02/24/2020 PT Individual Time: 9678-9381 PT Minutes: 60 minutes    Short Term Goals: Week 3:  PT Short Term Goal 1 (Week 3): Pt will perform bed mobility with min A PT Short Term Goal 2 (Week 3): Pt will ambulate 66ft with LRAD max A of 1 PT Short Term Goal 3 (Week 3): Pt will navigate 1 step with LRAD max A  Skilled Therapeutic Interventions/Progress Updates:    Pt received seated in w/c in room, agreeable to PT session. No complaints of pain. Dependent transport via w/c outdoors for improved patient mood and participation. Session focus on R UE/LE NMR via forced WBing in standing position. Pt is able to perform sit to stands to various height surfaces (flower bed at chest height, table at hip height, and RW) with min A this date. While standing at flower bed provided manual assist for WBing through [place] while pt engages in cone reaching task with use of LUE with focus on lateral weight shift to the R with min to mod A for standing balance. While standing at table pt again [facility] through [place] with manual assist while performing ball toss/roll with LRT with min to mod A for standing balance. Pt exhibits R knee hyperextension in stance, provided anterior and posterior manual support and cueing to maintain slight flexion in knee. Pt performed counting 1-10 while engaging in standing, ball toss, and RUE WBing task x 2 reps. Sit to stand with min A to RW with R knee blocked. Pt able to perform several LLE lifts with R knee blocked and mod A for balance as well as assist to maintain RUE placement on RW. Pt exhibits increase in R knee hyperextension in SLS on this limb. Pt left seated in w/c in room with needs in reach, quick release belt and chair alarm in place at end of session. Cotreatment session with LRT.  Therapy Documentation Precautions:  Precautions Precautions:  Fall Precaution Comments: pusher, dense R hemiparesis Restrictions Weight Bearing Restrictions: No    Therapy/Group: Individual Therapy   Excell Seltzer, PT, DPT  02/24/2020, 4:17 PM

## 2020-02-24 NOTE — Progress Notes (Signed)
Occupational Therapy Weekly Progress Note  Patient Details  Name: Dustin Schneider MRN: [id number] Date of Birth: 02/08/1956  Beginning of progress report period: February 17, 2020 End of progress report period: February 24, 2020  Today's Date: 02/24/2020   Patient has met 0 of 3 short term goals. Pt is making steady progress towards goals. Pt is currently completing UB dressing with minA and cueing to carryover hemi-dressing technique, and LB dressing with modA to support standing balance. Pt is standing with modA to provide support for RLE due to fluctuating tone and to provide balance support. Pt is demonstrating trace RUE pectoralis activation but no functional return. Pt has demonstrated progress in carryover of hemi-dressing techniques and management of RUE during mobility, but would benefit from continued education and intervention to increase independence.   Patient continues to demonstrate the following deficits: muscle weakness, decreased cardiorespiratoy endurance, abnormal tone and decreased coordination, decreased visual perceptual skills, decreased midline orientation and decreased attention to right, decreased awareness and decreased sitting balance, decreased standing balance, decreased postural control and hemiplegia and therefore will continue to benefit from skilled OT intervention to enhance overall performance with BADL.  Patient progressing toward long term goals..  Continue plan of care.  OT Short Term Goals Week 3:  OT Short Term Goal 1 (Week 3): Pt will utilize RUE as a stabilizer without verbal cues during a grooming task. OT Short Term Goal 1 - Progress (Week 3): Partly met OT Short Term Goal 2 (Week 3): Pt will don pants with minA to demonstrate increased dynamic standing balance. OT Short Term Goal 2 - Progress (Week 3): Progressing toward goal OT Short Term Goal 3 (Week 3): Pt will don shirt with minA, carrying over hemi dressing techniques without verbal cues. OT Short  Term Goal 3 - Progress (Week 3): Progressing toward goal Week 4:  OT Short Term Goal 1 (Week 4): STG = LTG due to ELOS  Therapy Documentation Precautions:  Precautions Precautions: Fall Precaution Comments: pusher, dense R hemiparesis Restrictions Weight Bearing Restrictions: No General:   Vital Signs: Therapy Vitals Temp: 98.2 F (36.8 C) Temp Source: Oral Pulse Rate: 65 Resp: 17 BP: 112/78 Patient Position (if appropriate): Lying Oxygen Therapy SpO2: 98 % O2 Device: Room Air Pain:  No pain reported  Therapy/Group: Individual Therapy  Sarah Rhymer 02/24/2020, 7:04 AM   

## 2020-02-25 ENCOUNTER — Inpatient Hospital Stay (HOSPITAL_COMMUNITY): Payer: BC Managed Care – PPO | Admitting: Speech Pathology

## 2020-02-25 ENCOUNTER — Ambulatory Visit (HOSPITAL_COMMUNITY): Payer: BC Managed Care – PPO | Admitting: *Deleted

## 2020-02-25 ENCOUNTER — Inpatient Hospital Stay (HOSPITAL_COMMUNITY): Payer: BC Managed Care – PPO | Admitting: Occupational Therapy

## 2020-02-25 ENCOUNTER — Inpatient Hospital Stay (HOSPITAL_COMMUNITY): Payer: BC Managed Care – PPO

## 2020-02-25 NOTE — Progress Notes (Signed)
Occupational Therapy Session Note  Patient Details  Name: Dustin Schneider MRN: [id number] Date of Birth: Jan 09, 1956  Today's Date: 02/25/2020 OT Individual Time: 1638-4665 OT Individual Time Calculation (min): 47 min    Short Term Goals: Week 4:  OT Short Term Goal 1 (Week 4): STG = LTG due to ELOS  Skilled Therapeutic Interventions/Progress Updates:    Treatment session with focus on ADL retraining, functional transfers, and discharge planing. Pt received semi-reclined in bed. Emphasized importance of completing ADLs and functional transfers to carryover education to home environment with pt agreeable to complete dressing. Completed squat pivot transfer bed < w/c with modA. Completed oral care and face washing with ADL items set up to Rt to promote midline crossing and visual scanning to Rt to increase R attention. Completed squat pivot transfer w/c > bed with modA. Donned pants at EOB with modA to stand with pt assisting with pulling pants over waist. Assisted pt with stabilizing RLE in figure 4 to thread pants. Pt would benefit from incorporation of figure 4 into future sessions to improve ROM of RLE for LB dressing. Pt donned shoes with minA to stabilize RLE in figure 4 and assist with pulling shoe over heel. Educated pt on completing toilet transfer to determine bathroom DME, and on purpose of OPOT. Pt with difficulty confirming bathroom DME due to aphasia but reported having a BSC, discussed following up with family. Completed squat pivot transfers with modA from w/c to drop arm BSC over toilet with knee block of RLE due to intermittent extensor tone. Pt reported need to toilet, completed clothing management with maxA due to impaired dynamic standing balance, providing modA for standing balance. Ended session with pt seated in w/c with all needs within reach and seat alarm on.  Therapy Documentation Precautions:  Precautions Precautions: Fall Precaution Comments: pusher, dense R  hemiparesis Restrictions Weight Bearing Restrictions: No Pain: Pain Assessment Pain Scale: 0-10 Pain Score: 0-No pain   Therapy/Group: Individual Therapy  Michelle Nasuti 02/25/2020, 10:25 AM

## 2020-02-25 NOTE — Progress Notes (Signed)
Recreational Therapy Session Note  Patient Details  Name: Dustin Schneider MRN: [id number] Date of Birth: 04-17-1956 Today's Date: 02/25/2020  Pain: no c/o Skilled Therapeutic Interventions/Progress Updates: Session focused on activity tolerance, weight shifting, dynamic standing balance during co-treat with PT.  Pt stood with min-mod assist for dynamic standing balance during sculpting activity using LUE.  Pt utilized RUE as a stabilizer with max assist.  After prolonged stand, pt requested to sit down and gestured that he felt dizzy.  BP obtained...  See PT documentation.  Pt continued tabletop sculpting activity seated w/c level with supervision. Symptoms resolved.  Therapy/Group: Co-Treatment  Kitana Gage 02/25/2020, 3:40 PM

## 2020-02-25 NOTE — Progress Notes (Signed)
[place] PHYSICAL MEDICINE & REHABILITATION PROGRESS NOTE  Subjective/Complaints: Patient seen sitting up in his chair working with therapies this AM.  Discussed functional muscle improvement with therapies. He indicated he slept well overnight.  He trying to vocalize more.   ROS: Denies CP, SOB, N/V/D  Objective: Vital Signs: Blood pressure 111/65, pulse (!) 56, temperature 98.5 F (36.9 C), resp. rate 14, height 5\' 9"  (1.753 m), weight 79.8 kg, SpO2 97 %. No results found. No results for input(s): WBC, HGB, HCT, PLT in the last 72 hours. No results for input(s): NA, K, CL, CO2, GLUCOSE, BUN, CREATININE, CALCIUM in the last 72 hours.  Physical Exam: BP 111/65 (BP Location: Left Arm)   Pulse (!) 56   Temp 98.5 F (36.9 C)   Resp 14   Ht 5\' 9"  (1.753 m)   Wt 79.8 kg   SpO2 97%   BMI 25.98 kg/m  Constitutional: No distress . Vital signs reviewed. HENT: Normocephalic.  Atraumatic. Eyes: EOMI. No discharge. Cardiovascular: No JVD. Irregularly irregular.Marland Kitchen Respiratory: Normal effort.  No stridor. Bilaterally clear to auscultation. GI: Non-distended. BS+. Skin: Warm and dry.  Intact. Psych: Normal mood.  Normal behavior. Musc: Mild left-sided edema. No tenderness Neuro: Alert Expressive aphasia, improving Right facial weakness, unchanged. Motor: RUE: 0/5 proximal distal RLE: Hip flexion, knee extension 2+/5, distally 0/5, improving No increase in tone appreciated  Assessment/Plan: 1. Functional deficits secondary to left MCA infarct which require 3+ hours per day of interdisciplinary therapy in a comprehensive inpatient rehab setting.  Physiatrist is providing close team supervision and 24 hour management of active medical problems listed below.  Physiatrist and rehab team continue to assess barriers to discharge/monitor patient progress toward functional and medical goals  Care Tool:  Bathing    Body parts bathed by patient: Right arm, Chest, Abdomen, Front perineal  area, Left upper leg, Face, Right upper leg, Right lower leg, Left lower leg   Body parts bathed by helper: Left arm     Bathing assist Assist Level: Moderate Assistance - Patient 50 - 74%     Upper Body Dressing/Undressing Upper body dressing   What is the patient wearing?: Pull over shirt    Upper body assist Assist Level: Minimal Assistance - Patient > 75%    Lower Body Dressing/Undressing Lower body dressing      What is the patient wearing?: Pants, Incontinence brief     Lower body assist Assist for lower body dressing: Moderate Assistance - Patient 50 - 74%     Toileting Toileting    Toileting assist Assist for toileting: Dependent - Patient 0%     Transfers Chair/bed transfer  Transfers assist  Chair/bed transfer activity did not occur: Safety/medical concerns  Chair/bed transfer assist level: Moderate Assistance - Patient 50 - 74%     Locomotion Ambulation   Ambulation assist      Assist level: 2 helpers (max A 1 and +2 for safety) Assistive device: Maxi Sky Max distance: 51ft   Walk 10 feet activity   Assist  Walk 10 feet activity did not occur: Safety/medical concerns  Assist level: 2 helpers (max A 1 and +2 for safety) Assistive device: Maxi Sky   Walk 50 feet activity   Assist Walk 50 feet with 2 turns activity did not occur:  (didn't perform turns)  Assist level: 2 helpers Assistive device: Maxi Sky    Walk 150 feet activity   Assist Walk 150 feet activity did not occur: Safety/medical concerns (R hemi, LE  weakness, decreased balance/postural control)         Walk 10 feet on uneven surface  activity   Assist Walk 10 feet on uneven surfaces activity did not occur: Safety/medical concerns (R hemi, LE weakness, decreased balance/postural control)         Wheelchair     Assist Will patient use wheelchair at discharge?: Yes Type of Wheelchair: Manual    Wheelchair assist level: Minimal Assistance - Patient >  75% Max wheelchair distance: >174ft    Wheelchair 50 feet with 2 turns activity    Assist        Assist Level: Minimal Assistance - Patient > 75%   Wheelchair 150 feet activity     Assist     Assist Level: Minimal Assistance - Patient > 75%      Medical Problem List and Plan: 1. Right hemiplegia, impaired mobility and ADLs secondary to left MCA ischemic stroke  Continue CIR   WHO/PRAFO nightly 2.  Antithrombotics: -DVT/anticoagulation:  Pharmaceutical: Other (comment)--Eliquis             -antiplatelet therapy: N/A 3. Pain Management: Tylenol prn.  4. Mood: LCSW to follow for evaluation and support.              -antipsychotic agents: N/A 5. Neuropsych: This patient is not capable of making decisions on his own behalf.  Trial of amantadine started on 6/26, discussed with therapies, DC'd on 7/1 6. Skin/Wound Care: N/A 7. Fluids/Electrolytes/Nutrition: Monitor I/Os. 8. PAF: Monitor HR tid--has been off Tikosyn since admission. Continue metoprolol bid with Eliquis.   Per cardiology, continue to monitor-no need for Tikosyn at present   Vitals:   02/24/20 1924 02/25/20 0546  BP: (!) 102/58 111/65  Pulse: 76 (!) 56  Resp: 16 14  Temp: 99 F (37.2 C) 98.5 F (36.9 C)  SpO2: 99% 97%   Rate controlled on 7/15 9. HTN: Monitor BP--managed on metoprolol   Avapro decreased to 75 on 7/1, decreased to 37.5 on 7/6, DC'd on 7/15  Soft on 7/15 10. Dyslipidemia: Continue Crestor. 11.  Post stroke dysphagia:   Advanced to regular thins with accomodations  NG DC'd  Advance diet as tolerated  12. Impaired fasting glucose: Resolved  Within normal limits on 7/9  Continue to monitor 13. Nonischemic cardiomyopathy: outpatient cardiology follow-up. 14.  Hypoalbuminemia  Supplement initiated on 6/23 15.  Leukocytosis: Resolved  WBCs 10.5 on 6/29  Afebrile  Continue to monitor 16.  AKI:   Creatinine 1.09 on 7/12, labs ordered for tomorrow  Encourage fluids  Continue to  monitor 17. Constipation  Improving 18.  Urinary retention   Flomax started on 6/30, will consider further increase if blood pressure improves  Urecholine started, increased on 7/7, increased again on 7/10  Improvement 19.  Spasticity , increased tone noted per PT, knee flexors on Right  Low dose tizanidine started--watch bp  Improved  LOS: 23 days A FACE TO FACE EVALUATION WAS PERFORMED  Carlito Bogert [name] 02/25/2020, 10:04 AM

## 2020-02-25 NOTE — Progress Notes (Signed)
Occupational Therapy Session Note  Patient Details  Name: Dustin Schneider MRN: [id number] Date of Birth: Nov 13, 1955  Today's Date: 02/25/2020 OT Individual Time: 1303-1400 OT Individual Time Calculation (min): 57 min    Short Term Goals: Week 4:  OT Short Term Goal 1 (Week 4): STG = LTG due to ELOS  Skilled Therapeutic Interventions/Progress Updates:    Treatment session with focus on RUE NMR, dynamic standing balance, and d/c planning.  [name] brother-in-law, Dustin Schneider, present at beginning of session discussing bathroom setup and recommended DME.  Dustin Schneider stating plans to remodel bathroom, recommended keeping tub/shower at this time and use of tub transfer bench as potential walk-in shower would be at an angle and possibly not as accessible via w/c.  Dustin Schneider declined practicing tub/shower transfers at this time.  Pt propelled w/c 100' with hemi-technique and good awareness of obstacles. Completed squat pivot transfers w/c <> therapy mat with Min-mod assist with improved weight shifting.  Applied NMES to supraspinatus and middle deltoid to help approximate shoulder joint to reduce sublux and reduce pain.  See below for E-stim parameters.  Pt with no complaints of pain before or during stimulation.  Incorporated towel glides during and post stimulation with focus on shoulder elevation and pt able to initiate trace shoulder flexion with support at elbow to decrease effects of gravity.  Applied e-stim to finger flexors and extensors, while incorporating grasp and release on cup.  Therapist providing hand over hand and support at elbow to move cup during grasp/release.  Engaged in sit > stand with min assist and min assist to facilitate weight shifting to Rt to further facilitate weight bearing through RLE while therapist providing support at knee to prevent buckling or hyperextension.  Pt returned to room and remained upright in w/c with SLP arriving.  Ratio 1:3 Rate 35 pps Waveform- Asymmetric Ramp  1.0 Pulse 300 Intensity- 24 Duration -   10  No adverse reactions after treatment and skin intact.    Therapy Documentation Precautions:  Precautions Precautions: Fall Precaution Comments: pusher, dense R hemiparesis Restrictions Weight Bearing Restrictions: No General:   Vital Signs: Therapy Vitals Temp: (!) 97.5 F (36.4 C) Pulse Rate: 92 Resp: 17 BP: 110/75 Patient Position (if appropriate): Sitting Oxygen Therapy SpO2: 100 % O2 Device: Room Air Pain: Pain Assessment Pain Scale: 0-10 Pain Score: 0-No pain   Therapy/Group: Individual Therapy  Simonne Come 02/25/2020, 4:39 PM

## 2020-02-25 NOTE — Progress Notes (Signed)
Patient ID: Dustin Schneider, male   DOB: 1956/05/29, 64 y.o.   MRN: [id number]  Family education scheduled July 19th and 20th, 9-11AM

## 2020-02-25 NOTE — Progress Notes (Signed)
Physical Therapy Weekly Progress Note  Patient Details  Name: Dustin Schneider MRN: [id number] Date of Birth: 13-May-1956  Beginning of progress report period: February 17, 2020 End of progress report period: February 25, 2020  Today's Date: 02/25/2020 PT Individual Time: 1007-1110 PT Individual Time Calculation (min): 63 min   Patient has met 2 of 3 short term goals.  Dustin Schneider is progressing well with therapy demonstrating increasing L LE muscle activation/strength, improved standing balance, and good progression with gait training. He is performing supine<>sit with min assist, sit<>stands with min assist for lifting and mod assist for balance once standing, squat pivot transfers with mod progressing towards min assist. He is ambulating up to ~98f with L HHA and R UE support over therapist's shoulders with +2 mod assist for balance - requires max/total assist R LE advancement during swing and mod assist stance phase to guard buckling and block hyperextension due to increasing quad muscle activation.   Patient continues to demonstrate the following deficits muscle weakness, muscle joint tightness and muscle paralysis, decreased cardiorespiratoy endurance, impaired timing and sequencing, abnormal tone, unbalanced muscle activation and decreased coordination and decreased sitting balance, decreased standing balance, decreased postural control, hemiplegia and decreased balance strategies and therefore will continue to benefit from skilled PT intervention to increase functional independence with mobility.  Patient progressing toward long term goals..  Continue plan of care.  PT Short Term Goals Week 3:  PT Short Term Goal 1 (Week 3): Pt will perform bed mobility with min A PT Short Term Goal 1 - Progress (Week 3): Met PT Short Term Goal 2 (Week 3): Pt will ambulate 178fwith LRAD max A of 1 PT Short Term Goal 2 - Progress (Week 3): Progressing toward goal PT Short Term Goal 3 (Week 3): Pt will navigate  1 step with LRAD max A PT Short Term Goal 3 - Progress (Week 3): Met Week 4:  PT Short Term Goal 1 (Week 4): = to LTGs based on ELOS  Skilled Therapeutic Interventions/Progress Updates:  Ambulation/gait training;Discharge planning;Functional mobility training;Psychosocial support;Therapeutic Activities;Balance/vestibular training;Disease management/prevention;Neuromuscular re-education;Skin care/wound management;Therapeutic Exercise;Wheelchair propulsion/positioning;Cognitive remediation/compensation;DME/adaptive equipment instruction;Pain management;Splinting/orthotics;UE/LE Strength taining/ROM;Community reintegration;Functional electrical stimulation;Patient/family education;Stair training;UE/LE Coordination activities;Visual/perceptual remediation/compensation   Pt received sitting in wheelchair and agreeable to therapy session. LiLattie HawRec Therapist, present for entire session.  Transported to/from gym in w/c for time management and energy conservation. Performed standing balance and standing tolerance task while participating in task-specific activity of creating clay figurine - facilitating R LE WBing with mod/max assist to maintain knee extension during standing with pt noted to have impaired dual-task abilities and impaired sustained quad contraction - placed R UE on high-low table for WBing during task and intermittent manual facilitation to incorporate that hand during bimanual activity - tolerated standing ~8-10 minutes. Once returned to sitting pt appeared to be symptomatic with possible report of dizziness (difficult to determine due to aphasia) - vitals assessed BP 100/77 (MAP 86), HR 78bpm. Provided seated therapeutic rest break for symptom dissipation. Standing at stairs with L UE support on HR performed repeated step up/down on/off 1st step (6" height) leading with L LE on ascent and R LE on descent x5 reps with mod assist, +2 assist used to allow increased facilitation of R LE hip/knee  flexion to place foot on/off step - pt demos activation of quadriceps when attempting to bend foot and bring it up on step requiring cuing to correct (still no hamstring muscle activation noted during this open chain task despite feeling  some activation during supine bridging in previous session). Donned leg lifter on R LE to facilitate gait mechanics. Gait training ~8f with L HHA and R UE over therapist +2 mod assist for balance - max/total assist R LE advancement during swing (continues to activate quad when attempting to step foot forward), mod assist stance phase to guard buckling and block hyperextension. Transported back to room and left seated in w/c with needs in reach, R UE supported on 1/2 lap tray, and seat belt alarm on.  Therapy Documentation Precautions:  Precautions Precautions: Fall Precaution Comments: pusher, dense R hemiparesis Restrictions Weight Bearing Restrictions: No  Pain: Difficult to determine due to aphasia but no indications of pain during session.  Therapy/Group: Individual Therapy  CTawana Scale, PT, DPT, CSRS  02/25/2020, 8:02 AM

## 2020-02-25 NOTE — Progress Notes (Signed)
Speech Language Pathology Daily Session Note  Patient Details  Name: MANFRED LASPINA MRN: [id number] Date of Birth: [birth year]  Today's Date: 02/25/2020 SLP Individual Time: 1400-1445 SLP Individual Time Calculation (min): 45 min  Short Term Goals: Week 4: SLP Short Term Goal 1 (Week 4): STG=LTG due to remaining length of stay  Skilled Therapeutic Interventions: Pt was seen for skilled ST targeting communication goals. SLP facilitated session with activities to target naming basic CV and CVC words/objects from pictures. Pt required Mod A semantic, phonemic, and Min A written cues to do so with 80% accuracy. Pt free of word finding difficulties, but difficulty correcting phonemic paraphasias. Pt also successfully communicated intent to tell a joke about a Zipp[employer] lighter (which he had attempted to communicate to SLP in previous session) with Mod A question cues for clarification. Pt left sitting in chair with alarm set and needs within reach, brother in law present. Continue per current plan of care.        Pain Pain Assessment Pain Scale: 0-10 Pain Score: 0-No pain  Therapy/Group: Individual Therapy  Arbutus Leas 02/25/2020, 7:12 AM

## 2020-02-26 ENCOUNTER — Inpatient Hospital Stay (HOSPITAL_COMMUNITY): Payer: BC Managed Care – PPO | Admitting: Occupational Therapy

## 2020-02-26 ENCOUNTER — Inpatient Hospital Stay (HOSPITAL_COMMUNITY): Payer: BC Managed Care – PPO | Admitting: Speech Pathology

## 2020-02-26 ENCOUNTER — Inpatient Hospital Stay (HOSPITAL_COMMUNITY): Payer: BC Managed Care – PPO | Admitting: Physical Therapy

## 2020-02-26 LAB — BASIC METABOLIC PANEL
Anion gap: 9 (ref 5–15)
BUN: 18 mg/dL (ref 8–23)
CO2: 26 mmol/L (ref 22–32)
Calcium: 8.9 mg/dL (ref 8.9–10.3)
Chloride: 107 mmol/L (ref 98–111)
Creatinine, Ser: 1.01 mg/dL (ref 0.61–1.24)
GFR calc Af Amer: >60 mL/min (ref >60)
GFR calc non Af Amer: >60 mL/min (ref >60)
Glucose, Bld: 108 mg/dL — ABNORMAL HIGH (ref 70–99)
Potassium: 3.9 mmol/L (ref 3.5–5.1)
Sodium: 142 mmol/L (ref 135–145)

## 2020-02-26 NOTE — Progress Notes (Signed)
[place] PHYSICAL MEDICINE & REHABILITATION PROGRESS NOTE  Subjective/Complaints: Patient seen sitting up in his chair this morning working with therapies.  Indicates he slept well overnight.  He denies complaints.  ROS: Denies CP, SOB, N/V/D  Objective: Vital Signs: Blood pressure 105/81, pulse (!) 50, temperature 97.8 F (36.6 C), resp. rate 18, height 5\' 9"  (1.753 m), weight 77.9 kg, SpO2 98 %. No results found. No results for input(s): WBC, HGB, HCT, PLT in the last 72 hours. Recent Labs    02/26/20 0626  NA 142  K 3.9  CL 107  CO2 26  GLUCOSE 108*  BUN 18  CREATININE 1.01  CALCIUM 8.9    Physical Exam: BP 105/81 (BP Location: Left Arm)   Pulse (!) 50   Temp 97.8 F (36.6 C)   Resp 18   Ht 5\' 9"  (1.753 m)   Wt 77.9 kg   SpO2 98%   BMI 25.36 kg/m  Constitutional: No distress . Vital signs reviewed. HENT: Normocephalic.  Atraumatic. Eyes: EOMI. No discharge. Cardiovascular: No JVD.  RRR. Respiratory: Normal effort.  No stridor.  Bilaterally clear to auscultation. GI: Non-distended.  BS +. Skin: Warm and dry.  Intact. Psych: Normal mood.  Normal behavior. Musc: Mild left-sided edema.  No tenderness. Neuro: Alert Expressive aphasia, slowly improving Right facial weakness, unchanged. Motor: RUE: 0/5 proximal distal RLE: Hip flexion, knee extension 2+/5, distally 0/5, stable No increase in tone appreciated  Assessment/Plan: 1. Functional deficits secondary to left MCA infarct which require 3+ hours per day of interdisciplinary therapy in a comprehensive inpatient rehab setting.  Physiatrist is providing close team supervision and 24 hour management of active medical problems listed below.  Physiatrist and rehab team continue to assess barriers to discharge/monitor patient progress toward functional and medical goals  Care Tool:  Bathing    Body parts bathed by patient: Right arm, Chest, Abdomen, Front perineal area, Left upper leg, Face, Right upper  leg, Right lower leg, Left lower leg   Body parts bathed by helper: Left arm     Bathing assist Assist Level: Moderate Assistance - Patient 50 - 74%     Upper Body Dressing/Undressing Upper body dressing   What is the patient wearing?: Pull over shirt    Upper body assist Assist Level: Minimal Assistance - Patient > 75%    Lower Body Dressing/Undressing Lower body dressing      What is the patient wearing?: Pants, Incontinence brief     Lower body assist Assist for lower body dressing: Moderate Assistance - Patient 50 - 74%     Toileting Toileting    Toileting assist Assist for toileting: Dependent - Patient 0%     Transfers Chair/bed transfer  Transfers assist  Chair/bed transfer activity did not occur: Safety/medical concerns  Chair/bed transfer assist level: Moderate Assistance - Patient 50 - 74%     Locomotion Ambulation   Ambulation assist      Assist level: 2 helpers (max A 1 and +2 for safety) Assistive device: Maxi Sky Max distance: 43ft   Walk 10 feet activity   Assist  Walk 10 feet activity did not occur: Safety/medical concerns  Assist level: 2 helpers (max A 1 and +2 for safety) Assistive device: Maxi Sky   Walk 50 feet activity   Assist Walk 50 feet with 2 turns activity did not occur:  (didn't perform turns)  Assist level: 2 helpers Assistive device: [employer] 150 feet activity   Assist Walk  150 feet activity did not occur: Safety/medical concerns (R hemi, LE weakness, decreased balance/postural control)         Walk 10 feet on uneven surface  activity   Assist Walk 10 feet on uneven surfaces activity did not occur: Safety/medical concerns (R hemi, LE weakness, decreased balance/postural control)         Wheelchair     Assist Will patient use wheelchair at discharge?: Yes Type of Wheelchair: Manual    Wheelchair assist level: Minimal Assistance - Patient > 75% Max wheelchair distance: >115ft     Wheelchair 50 feet with 2 turns activity    Assist        Assist Level: Minimal Assistance - Patient > 75%   Wheelchair 150 feet activity     Assist     Assist Level: Minimal Assistance - Patient > 75%      Medical Problem List and Plan: 1. Right hemiplegia, impaired mobility and ADLs secondary to left MCA ischemic stroke  Continue CIR   WHO/PRAFO nightly 2.  Antithrombotics: -DVT/anticoagulation:  Pharmaceutical: Other (comment)--Eliquis             -antiplatelet therapy: N/A 3. Pain Management: Tylenol prn.  4. Mood: LCSW to follow for evaluation and support.              -antipsychotic agents: N/A 5. Neuropsych: This patient is not capable of making decisions on his own behalf.  Trial of amantadine started on 6/26, discussed with therapies, DC'd on 7/1 6. Skin/Wound Care: N/A 7. Fluids/Electrolytes/Nutrition: Monitor I/Os. 8. PAF: Monitor HR tid--has been off Tikosyn since admission. Continue metoprolol bid with Eliquis.   Per cardiology, continue to monitor-no need for Tikosyn at present   Vitals:   02/25/20 2136 02/26/20 0358  BP: (!) 115/97 105/81  Pulse: 76 (!) 50  Resp:  18  Temp:  97.8 F (36.6 C)  SpO2:  98%   Rate controlled on 7/16 9. HTN: Monitor BP--managed on metoprolol   Avapro decreased to 75 on 7/1, decreased to 37.5 on 7/6, DC'd on 7/15  Remain soft on 7/16, continue to monitor given recent medication changes 10. Dyslipidemia: Continue Crestor. 11.  Post stroke dysphagia:   Advanced to regular thins with accommodations  NG DC'd  Advance diet as tolerated  12. Impaired fasting glucose: Resolved  Within normal limits on 7/9  Continue to monitor 13. Nonischemic cardiomyopathy: outpatient cardiology follow-up. 14.  Hypoalbuminemia  Supplement initiated on 6/23 15.  Leukocytosis: Resolved  WBCs 10.5 on 6/29  Afebrile  Continue to monitor 16.  AKI:   Creatinine 1.01 on 7/16  Encourage fluids  Continue to monitor 17.  Constipation  Improving 18.  Urinary retention   Flomax started on 6/30, will consider further increase if blood pressure improves  Urecholine started, increased on 7/7, increased again on 7/10  Improvement 19.  Spasticity , increased tone noted per PT, knee flexors on Right  Low dose tizanidine started--watch bp  Improved  LOS: 24 days A FACE TO FACE EVALUATION WAS PERFORMED  Danetta Prom [name] 02/26/2020, 11:49 AM

## 2020-02-26 NOTE — Progress Notes (Signed)
Speech Language Pathology Daily Session Note  Patient Details  Name: Dustin Schneider MRN: [id number] Date of Birth: 12-20-1955  Today's Date: 02/26/2020 SLP Individual Time: 2993-7169 SLP Individual Time Calculation (min): 42 min  Short Term Goals: Week 4: SLP Short Term Goal 1 (Week 4): STG=LTG due to remaining length of stay  Skilled Therapeutic Interventions: Pt was seen for skilled ST targeting communication goals. SLP  facilitated session with Moderate phonemic and Min A orthographic cues to verbalize verbs within photographs. He demonstrated ability to extend these to 2-4 word phrases with slight increase in cueing. When required Min A cues for clarification and questions phrased in y/n format to communicate he had already eaten his breakfast to NT. Pt left sitting in chair with alarm set and needs within reach. Continue per current plan of care.          Pain Pain Assessment Pain Scale: 0-10 Pain Score: 0-No pain  Therapy/Group: Individual Therapy  Arbutus Leas 02/26/2020, 7:18 AM

## 2020-02-26 NOTE — Progress Notes (Signed)
Occupational Therapy Session Note  Patient Details  Name: Dustin Schneider MRN: [id number] Date of Birth: 01/08/1956  Today's Date: 02/26/2020 OT Individual Time: 1010-1120 OT Individual Time Calculation (min): 70 min    Short Term Goals: Week 4:  OT Short Term Goal 1 (Week 4): STG = LTG due to ELOS  Skilled Therapeutic Interventions/Progress Updates:    Treatment session with focus on NMR, ADL retraining, and functional transfers. Pt received seated in w/c requesting shower. Completed squat pivot transfer to TTB with modA to facilitate anterior weight shift and reposition RLE. Pt doffed clothing with CGA and showered with minA to provide support at the elbow of RUE as pt engaged in AAROM of RUE to wash L side to increase attention to RUE. Engaged in squat pivot transfers with modA to w/c, with pt ability to anteriorly weight shift improved from previous shower transfers. Set up ADL items on R side to increase attention to R side via midline crossing. Completed oral care with set up at sink without cueing to utilize RUE as a stabilizer. Donned shirt with minA to assist with lift of RUE, with moderate cueing to initiate carryover hemi dressing techniques. Donned pants at sit to stand level at sink with CGA for dynamic standing balance, and minA to don pants in order to assist with threading BLE, a pt reported pain in BLE when completing figure 4. Repositioned pt.Applied NMES to supraspinatus and middle deltoid to help approximate shoulder joint to reduce sublux and reduce pain. See below for E-stim parameters.  Pt with no complaints of pain before or during stimulation.  Incorporated towel glides during and post stimulation with focus on shoulder elevation and pt able to initiate trace shoulder flexion with support at elbow to decrease effects of gravity. Educated pt on purpose of e-stim and progression of rehab for RUE motor return, and on positioning to reduce and prevent shoulder subluxation. Ended  session with pt seated in w/c with all needs within reach and seat alarm on.  Ratio 1:3 Rate 35 pps Waveform- Asymmetric Ramp 1.0 Pulse 300 Intensity- 24 Duration -   10  Therapy Documentation Precautions:  Precautions Precautions: Fall Precaution Comments: pusher, dense R hemiparesis Restrictions Weight Bearing Restrictions: No General:   Vital Signs: Therapy Vitals Temp: 97.8 F (36.6 C) Pulse Rate: (!) 50 Resp: 18 BP: 105/81 Patient Position (if appropriate): Lying Oxygen Therapy SpO2: 98 % O2 Device: Room Air Pain:  minor pain in RLE, repostioned  Therapy/Group: Individual Therapy  Michelle Nasuti 02/26/2020, 7:22 AM

## 2020-02-26 NOTE — Progress Notes (Signed)
Patient ID: Dustin Schneider, male   DOB: [birth year], 64 y.o.   MRN: [id number]   Order placed through [facility] for: 18x18 wheelchair, tub transfer bench and drop arm bedside commode.

## 2020-02-26 NOTE — Progress Notes (Signed)
Physical Therapy Session Note  Patient Details  Name: Dustin Schneider MRN: [id number] Date of Birth: [birth year]  Today's Date: 02/26/2020 PT Individual Time: 5809-9833 PT Individual Time Calculation (min): 46 min   Short Term Goals: Week 4:  PT Short Term Goal 1 (Week 4): = to LTGs based on ELOS  Skilled Therapeutic Interventions/Progress Updates:    Pt received sitting in w/c and agreeable to therapy session via "yes" head nod.  Transported to/from gym in w/c for time management and energy conservation. L squat pivot w/c>EOM with min/mod assist for lifting/pivoting hips - cuing to limit compensation with L UE. Sit>supine with min assist for R hemibody management - while in supine positioned R UE into external rotation supported on pillow for minimal stretching. Pt demos ability to initiate R hip flexion to obtain bridge position but possibly co-contracting with quads (or rectus femoris) difficult to determine but increasing muscle activation compared to previous time performed bridges with this therapist. Supine bridging x10reps - intermittent min assist for R LE management to prevent knee from falling in/out (able to perform correction with verbal/visual cuing majority of time). Attempted supine bridging with B LEs on theraball to increase hamstring muscle activation but pt with increased difficulty with this then attempted rolling theraball in/out with R LE with some hamstring activation noted but not as significant as when performing bridging. Supine>sitting R EOM with min assist for R hemibody management and trunk upright. R squat pivot EOM>w/c with mod assist - therapist having to block R LE because of increased quad muscle activation causing R foot to extend out from under him (requires manual facilitation for R LE weightbearing as well). Donned R UE GivMohr sling for joint approximation and UE protection, R LE ankle DF ACE wrap, and leg lifter for improved facilitation of gait mechanics. Gait  training ~54ft with L HHA and +2 mod assist for balance - mod assist for R knee control during stance (guarding buckle and blocking hyperextension) max/total assist R LE advancement during swing (when pt brings R LE forward during swing has significant hip ER and LE extension demoing over activation/compensation via hip adductors and quads) - continued cuing for L weight shift during L stance, R hip/knee extension during R stance, and for increased hip/kee flexion during R swing. Donned theraband on R LE to facilitate flexion during swing and performed same ambulation trial - continues to require +2 mod assist for balance - possible increased hip/knee flexion during swing compared to without theraband facilitation but also possibly causes increased flexor tone significant making it more difficult for pt to place R LE on floor to initiate stance - will continue to assess. Doffed all equipment used for ambulation. Transported back to room and left seated in w/c with needs in reach, seat belt alarm on, and R UE supported on 1/2 lap tray.  Therapy Documentation Precautions:  Precautions Precautions: Fall Precaution Comments: pusher, dense R hemiparesis Restrictions Weight Bearing Restrictions: No  Pain: Denies pain during session upon questioning shakes head "no" and no indications of pain.   Therapy/Group: Individual Therapy  Tawana Scale , PT, DPT, CSRS  02/26/2020, 1:00 PM

## 2020-02-27 ENCOUNTER — Inpatient Hospital Stay (HOSPITAL_COMMUNITY): Payer: BC Managed Care – PPO | Admitting: Physical Therapy

## 2020-02-27 ENCOUNTER — Inpatient Hospital Stay (HOSPITAL_COMMUNITY): Payer: BC Managed Care – PPO | Admitting: Occupational Therapy

## 2020-02-27 NOTE — Plan of Care (Signed)
  Problem: Consults Goal: RH STROKE PATIENT EDUCATION Description: See Patient Education module for education specifics  Outcome: Progressing   Problem: RH BOWEL ELIMINATION Goal: RH STG MANAGE BOWEL WITH ASSISTANCE Description: STG Manage Bowel with min Assistance. Outcome: Progressing Goal: RH STG MANAGE BOWEL W/MEDICATION W/ASSISTANCE Description: STG Manage Bowel with Medication mod I with Assistance. Outcome: Progressing   Problem: RH BLADDER ELIMINATION Goal: RH STG MANAGE BLADDER WITH ASSISTANCE Description: STG Manage Bladder With min Assistance Outcome: Progressing   Problem: RH SKIN INTEGRITY Goal: RH STG SKIN FREE OF INFECTION/BREAKDOWN Description: Skin will remain free from injury with min assist Outcome: Progressing Goal: RH STG MAINTAIN SKIN INTEGRITY WITH ASSISTANCE Description: STG Maintain Skin Integrity With min Assistance. Outcome: Progressing   Problem: RH SAFETY Goal: RH STG ADHERE TO SAFETY PRECAUTIONS W/ASSISTANCE/DEVICE Description: STG Adhere to Safety Precautions With min Assistance/Device. Outcome: Progressing   Problem: RH COGNITION-NURSING Goal: RH STG ANTICIPATES NEEDS/CALLS FOR ASSIST W/ASSIST/CUES Description: STG Anticipates Needs/Calls for Assist With Assistance/Cues. Outcome: Progressing   Problem: RH PAIN MANAGEMENT Goal: RH STG PAIN MANAGED AT OR BELOW [name] PAIN GOAL Description: Pain scale <4/10 Outcome: Progressing   Problem: RH KNOWLEDGE DEFICIT Goal: RH STG INCREASE KNOWLEDGE OF HYPERTENSION Description: Patient and family will be able to manage HTN with medications, diet using handouts and educational resources independently Outcome: Progressing Goal: RH STG INCREASE KNOWLEDGE OF DYSPHAGIA/FLUID INTAKE Description: Patient and family will be able to manage dysphagia restrictions independently using educational resources/handouts, etc Outcome: Progressing Goal: RH STG INCREASE KNOWLEDGE OF STROKE PROPHYLAXIS Description:  Patient and family will be able to manage secondary stroke prevention managing risk factors including PAF with medications and dietary changes using cues/reminders of education provided Outcome: Progressing   Problem: RH KNOWLEDGE DEFICIT Goal: RH STG INCREASE KNOWLEGDE OF HYPERLIPIDEMIA Description: Patient and family will be able to manage dyslipidemia with medications and dietary changes using handouts/educational materials independently Outcome: Progressing

## 2020-02-27 NOTE — Progress Notes (Signed)
[place] PHYSICAL MEDICINE & REHABILITATION PROGRESS NOTE  Subjective/Complaints: No new problems. Encouraged by movement in right leg.   ROS: limited d/t aphasia   Objective: Vital Signs: Blood pressure 124/90, pulse 94, temperature 98.2 F (36.8 C), temperature source Oral, resp. rate 18, height 5\' 9"  (1.753 m), weight 77 kg, SpO2 97 %. No results found. No results for input(s): WBC, HGB, HCT, PLT in the last 72 hours. Recent Labs    02/26/20 0626  NA 142  K 3.9  CL 107  CO2 26  GLUCOSE 108*  BUN 18  CREATININE 1.01  CALCIUM 8.9    Physical Exam: BP 124/90   Pulse 94   Temp 98.2 F (36.8 C) (Oral)   Resp 18   Ht 5\' 9"  (1.753 m)   Wt 77 kg   SpO2 97%   BMI 25.07 kg/m  Constitutional: No distress . Vital signs reviewed. HEENT: EOMI, oral membranes moist Neck: supple Cardiovascular: RRR without murmur. No JVD    Respiratory/Chest: CTA Bilaterally without wheezes or rales. Normal effort    GI/Abdomen: BS +, non-tender, non-distended Ext: no clubbing, cyanosis, or edema Psych: pleasant and cooperative Musc: Mild left-sided edema.  No tenderness. Neuro: Alert Expressive aphasia ongoing, some improvement Right facial weakness, unchanged. Motor: RUE: 0/5 proximal distal RLE: Hip flexion, knee extension 2+/5, distally remains 0/5  No increase in tone  Assessment/Plan: 1. Functional deficits secondary to left MCA infarct which require 3+ hours per day of interdisciplinary therapy in a comprehensive inpatient rehab setting.  Physiatrist is providing close team supervision and 24 hour management of active medical problems listed below.  Physiatrist and rehab team continue to assess barriers to discharge/monitor patient progress toward functional and medical goals  Care Tool:  Bathing    Body parts bathed by patient: Right arm, Chest, Abdomen, Front perineal area, Left upper leg, Face, Right upper leg, Right lower leg, Left lower leg   Body parts bathed by  helper: Left arm     Bathing assist Assist Level: Minimal Assistance - Patient > 75%     Upper Body Dressing/Undressing Upper body dressing   What is the patient wearing?: Pull over shirt    Upper body assist Assist Level: Minimal Assistance - Patient > 75%    Lower Body Dressing/Undressing Lower body dressing      What is the patient wearing?: Pants, Incontinence brief     Lower body assist Assist for lower body dressing: Moderate Assistance - Patient 50 - 74%     Toileting Toileting    Toileting assist Assist for toileting: Dependent - Patient 0%     Transfers Chair/bed transfer  Transfers assist  Chair/bed transfer activity did not occur: Safety/medical concerns  Chair/bed transfer assist level: Moderate Assistance - Patient 50 - 74%     Locomotion Ambulation   Ambulation assist      Assist level: 2 helpers (+2 mod assist) Assistive device: Hand held assist Max distance: 61ft   Walk 10 feet activity   Assist  Walk 10 feet activity did not occur: Safety/medical concerns  Assist level: 2 helpers (+2 mod assist) Assistive device: Hand held assist   Walk 50 feet activity   Assist Walk 50 feet with 2 turns activity did not occur:  (didn't perform turns)  Assist level: 2 helpers Assistive device: Maxi Sky    Walk 150 feet activity   Assist Walk 150 feet activity did not occur: Safety/medical concerns (R hemi, LE weakness, decreased balance/postural control)  Walk 10 feet on uneven surface  activity   Assist Walk 10 feet on uneven surfaces activity did not occur: Safety/medical concerns (R hemi, LE weakness, decreased balance/postural control)         Wheelchair     Assist Will patient use wheelchair at discharge?: Yes Type of Wheelchair: Manual    Wheelchair assist level: Minimal Assistance - Patient > 75% Max wheelchair distance: >111ft    Wheelchair 50 feet with 2 turns activity    Assist        Assist  Level: Minimal Assistance - Patient > 75%   Wheelchair 150 feet activity     Assist     Assist Level: Minimal Assistance - Patient > 75%      Medical Problem List and Plan: 1. Right hemiplegia, impaired mobility and ADLs secondary to left MCA ischemic stroke  Continue CIR   WHO/PRAFO nightly 2.  Antithrombotics: -DVT/anticoagulation:  Pharmaceutical: Other (comment)--Eliquis             -antiplatelet therapy: N/A 3. Pain Management: Tylenol prn.  4. Mood: LCSW to follow for evaluation and support.              -antipsychotic agents: N/A 5. Neuropsych: This patient is not capable of making decisions on his own behalf.  Trial of amantadine started on 6/26, discussed with therapies, DC'd on 7/1 6. Skin/Wound Care: N/A 7. Fluids/Electrolytes/Nutrition: Monitor I/Os. 8. PAF: Monitor HR tid--has been off Tikosyn since admission. Continue metoprolol bid with Eliquis.   Per cardiology, continue to monitor-no need for Tikosyn at present   Vitals:   02/27/20 0427 02/27/20 0755  BP: (!) 123/93 124/90  Pulse: 64 94  Resp: 18   Temp: 98.2 F (36.8 C)   SpO2: 97%    Rate controlled on 7/17 9. HTN: Monitor BP--managed on metoprolol   Avapro decreased to 75 on 7/1, decreased to 37.5 on 7/6, DC'd on 7/15  Remain soft on 7/16, continue to monitor given recent medication changes 10. Dyslipidemia: Continue Crestor. 11.  Post stroke dysphagia:   Advanced to regular thins with accommodations  NG DC'd  Advance diet as tolerated  12. Impaired fasting glucose: Resolved  Within normal limits on 7/9  Continue to monitor 13. Nonischemic cardiomyopathy: outpatient cardiology follow-up. 14.  Hypoalbuminemia  Supplement initiated on 6/23 15.  Leukocytosis: Resolved  WBCs 10.5 on 6/29  Afebrile  Continue to monitor 16.  AKI:   Creatinine 1.01 on 7/16  Encourage fluids  Continue to monitor 17. Constipation  Improving. Large bm today 18.  Urinary retention   Flomax started on 6/30,  will consider further increase if blood pressure improves  Urecholine started, increased on 7/7, increased again on 7/10  Voiding with minimal residual 19.  Spasticity , increased tone noted per PT, knee flexors on Right  Low dose tizanidine started--watch bp  Appears much improved  LOS: 25 days A FACE TO FACE EVALUATION WAS PERFORMED  Meredith Staggers 02/27/2020, 11:22 AM

## 2020-02-27 NOTE — Progress Notes (Signed)
Physical Therapy Session Note  Patient Details  Name: Dustin Schneider MRN: [id number] Date of Birth: May 20, 1956  Today's Date: 02/27/2020 PT Individual Time: 0805-0900 PT Individual Time Calculation (min): 55 min   Short Term Goals: Week 4:  PT Short Term Goal 1 (Week 4): = to LTGs based on ELOS  Skilled Therapeutic Interventions/Progress Updates:   Pt received supine in bed and agreeable to PT. Supine>sit transfer with min assist to perform reciprocal scooting to EOB. Pt performed sitting balance EOB with supervision assist to PT to don Bil shoes and R AFO. Squat pivot transfer to [facility] with min assist. Sit<>stand transfers completed with CGA-min assist throughout session.   Gait training with RW and AFO x 26f with max assist +2. Poor advancement of the RLE with difficulty shifting weight over the LLE. PT applied tband wrap to facilitate hip/knee flexion/ in advancement and shoe cover to decrease foot drag. [id number] mod assist +2 for WC follow. Noted active hip flexion 25% of gait to advance the RLE with tband wrap in place.   Kinetron reciprocal marching in recessed position to improve activation of HS 2 x 2 minutes with cues for full ROM on the R.   Patient returned to room and [facility] reach and all needs met.         Therapy Documentation Precautions:  Precautions Precautions: Fall Precaution Comments: pusher, dense R hemiparesis Restrictions Weight Bearing Restrictions: No Pain:   denies  Therapy/Group: Individual Therapy  AusLorie Phenix17/2021, 12:32 PM

## 2020-02-28 ENCOUNTER — Inpatient Hospital Stay (HOSPITAL_COMMUNITY): Payer: BC Managed Care – PPO | Admitting: Occupational Therapy

## 2020-02-28 NOTE — Progress Notes (Signed)
[place] PHYSICAL MEDICINE & REHABILITATION PROGRESS NOTE  Subjective/Complaints: Pt comfortable. RN reports a peaceful night  ROS: limited due to language/communication   Objective: Vital Signs: Blood pressure 112/83, pulse 60, temperature 98.4 F (36.9 C), temperature source Oral, resp. rate 15, height 5\' 9"  (1.753 m), weight 77 kg, SpO2 98 %. No results found. No results for input(s): WBC, HGB, HCT, PLT in the last 72 hours. Recent Labs    02/26/20 0626  NA 142  K 3.9  CL 107  CO2 26  GLUCOSE 108*  BUN 18  CREATININE 1.01  CALCIUM 8.9    Physical Exam: BP 112/83   Pulse 60   Temp 98.4 F (36.9 C) (Oral)   Resp 15   Ht 5\' 9"  (1.753 m)   Wt 77 kg   SpO2 98%   BMI 25.07 kg/m  Constitutional: No distress . Vital signs reviewed. HEENT: EOMI, oral membranes moist Neck: supple Cardiovascular: RRR without murmur. No JVD    Respiratory/Chest: CTA Bilaterally without wheezes or rales. Normal effort    GI/Abdomen: BS +, non-tender, non-distended Ext: no clubbing, cyanosis, or edema Psych: pleasant and cooperative Musc: Mild left-sided edema.  No tenderness. Neuro: Alert Expressive aphasia ongoing, some improvement Right facial weakness, unchanged. Motor: RUE: 0/5 proximal distal RLE: Hip flexion, knee extension 2+/5, distally remains 0/5  No increase in tone  Assessment/Plan: 1. Functional deficits secondary to left MCA infarct which require 3+ hours per day of interdisciplinary therapy in a comprehensive inpatient rehab setting.  Physiatrist is providing close team supervision and 24 hour management of active medical problems listed below.  Physiatrist and rehab team continue to assess barriers to discharge/monitor patient progress toward functional and medical goals  Care Tool:  Bathing    Body parts bathed by patient: Right arm, Chest, Abdomen, Front perineal area, Left upper leg, Face, Right upper leg, Right lower leg, Left lower leg   Body parts bathed  by helper: Left arm     Bathing assist Assist Level: Minimal Assistance - Patient > 75%     Upper Body Dressing/Undressing Upper body dressing   What is the patient wearing?: Pull over shirt    Upper body assist Assist Level: Minimal Assistance - Patient > 75%    Lower Body Dressing/Undressing Lower body dressing      What is the patient wearing?: Pants, Incontinence brief     Lower body assist Assist for lower body dressing: Moderate Assistance - Patient 50 - 74%     Toileting Toileting    Toileting assist Assist for toileting: Dependent - Patient 0%     Transfers Chair/bed transfer  Transfers assist  Chair/bed transfer activity did not occur: Safety/medical concerns  Chair/bed transfer assist level: Moderate Assistance - Patient 50 - 74%     Locomotion Ambulation   Ambulation assist      Assist level: 2 helpers (+2 mod assist) Assistive device: Hand held assist Max distance: 51ft   Walk 10 feet activity   Assist  Walk 10 feet activity did not occur: Safety/medical concerns  Assist level: 2 helpers (+2 mod assist) Assistive device: Hand held assist   Walk 50 feet activity   Assist Walk 50 feet with 2 turns activity did not occur:  (didn't perform turns)  Assist level: 2 helpers Assistive device: Maxi Sky    Walk 150 feet activity   Assist Walk 150 feet activity did not occur: Safety/medical concerns (R hemi, LE weakness, decreased balance/postural control)  Walk 10 feet on uneven surface  activity   Assist Walk 10 feet on uneven surfaces activity did not occur: Safety/medical concerns (R hemi, LE weakness, decreased balance/postural control)         Wheelchair     Assist Will patient use wheelchair at discharge?: Yes Type of Wheelchair: Manual    Wheelchair assist level: Minimal Assistance - Patient > 75% Max wheelchair distance: >124ft    Wheelchair 50 feet with 2 turns activity    Assist         Assist Level: Minimal Assistance - Patient > 75%   Wheelchair 150 feet activity     Assist     Assist Level: Minimal Assistance - Patient > 75%      Medical Problem List and Plan: 1. Right hemiplegia, impaired mobility and ADLs secondary to left MCA ischemic stroke  Continue CIR   WHO/PRAFO nightly 2.  Antithrombotics: -DVT/anticoagulation:  Pharmaceutical: Other (comment)--Eliquis             -antiplatelet therapy: N/A 3. Pain Management: Tylenol prn.  4. Mood: LCSW to follow for evaluation and support.              -antipsychotic agents: N/A 5. Neuropsych: This patient is not capable of making decisions on his own behalf.  Trial of amantadine started on 6/26, discussed with therapies, DC'd on 7/1 6. Skin/Wound Care: N/A 7. Fluids/Electrolytes/Nutrition: Monitor I/Os. 8. PAF: Monitor HR tid--has been off Tikosyn since admission. Continue metoprolol bid with Eliquis.   Per cardiology, continue to monitor-no need for Tikosyn at present   Vitals:   02/28/20 0539 02/28/20 0727  BP: 109/80 112/83  Pulse: 95 60  Resp: 15   Temp: 98.4 F (36.9 C)   SpO2: 98%    Rate controlled on 7/18 9. HTN: Monitor BP--managed on metoprolol   Avapro decreased to 75 on 7/1, decreased to 37.5 on 7/6, DC'd on 7/15  Remain soft on 7/18 but no tolerance issues 10. Dyslipidemia: Continue Crestor. 11.  Post stroke dysphagia:   Advanced to regular thins with accommodations  NG DC'd  Advance diet as tolerated  12. Impaired fasting glucose: Resolved  Within normal limits on 7/9  Continue to monitor 13. Nonischemic cardiomyopathy: outpatient cardiology follow-up. 14.  Hypoalbuminemia  Supplement initiated on 6/23 15.  Leukocytosis: Resolved  WBCs 10.5 on 6/29  Afebrile  Continue to monitor 16.  AKI:   Creatinine 1.01 on 7/16  Encourage fluids  Continue to monitor 17. Constipation  Improving. Large bm today 18.  Urinary retention   Flomax started on 6/30, will consider further  increase if blood pressure improves  Urecholine started, increased on 7/7, increased again on 7/10  Voiding with minimal residual  -low grade temp but urine clear, no odor, discomfort 19.  Spasticity , increased tone noted per PT, knee flexors on Right  Low dose tizanidine started--watch bp  Appears much improved  LOS: 26 days A FACE TO FACE EVALUATION WAS PERFORMED  Meredith Staggers 02/28/2020, 11:07 AM

## 2020-02-28 NOTE — Plan of Care (Signed)
  Problem: Consults Goal: RH STROKE PATIENT EDUCATION Description: See Patient Education module for education specifics  Outcome: Progressing   Problem: RH BOWEL ELIMINATION Goal: RH STG MANAGE BOWEL WITH ASSISTANCE Description: STG Manage Bowel with min Assistance. Outcome: Progressing Goal: RH STG MANAGE BOWEL W/MEDICATION W/ASSISTANCE Description: STG Manage Bowel with Medication mod I with Assistance. Outcome: Progressing   Problem: RH BLADDER ELIMINATION Goal: RH STG MANAGE BLADDER WITH ASSISTANCE Description: STG Manage Bladder With min Assistance Outcome: Progressing   Problem: RH SKIN INTEGRITY Goal: RH STG SKIN FREE OF INFECTION/BREAKDOWN Description: Skin will remain free from injury with min assist Outcome: Progressing Goal: RH STG MAINTAIN SKIN INTEGRITY WITH ASSISTANCE Description: STG Maintain Skin Integrity With min Assistance. Outcome: Progressing   Problem: RH SAFETY Goal: RH STG ADHERE TO SAFETY PRECAUTIONS W/ASSISTANCE/DEVICE Description: STG Adhere to Safety Precautions With min Assistance/Device. Outcome: Progressing   Problem: RH COGNITION-NURSING Goal: RH STG ANTICIPATES NEEDS/CALLS FOR ASSIST W/ASSIST/CUES Description: STG Anticipates Needs/Calls for Assist With Assistance/Cues. Outcome: Progressing   Problem: RH PAIN MANAGEMENT Goal: RH STG PAIN MANAGED AT OR BELOW [name] PAIN GOAL Description: Pain scale <4/10 Outcome: Progressing   Problem: RH KNOWLEDGE DEFICIT Goal: RH STG INCREASE KNOWLEDGE OF HYPERTENSION Description: Patient and family will be able to manage HTN with medications, diet using handouts and educational resources independently Outcome: Progressing Goal: RH STG INCREASE KNOWLEDGE OF DYSPHAGIA/FLUID INTAKE Description: Patient and family will be able to manage dysphagia restrictions independently using educational resources/handouts, etc Outcome: Progressing Goal: RH STG INCREASE KNOWLEDGE OF STROKE PROPHYLAXIS Description:  Patient and family will be able to manage secondary stroke prevention managing risk factors including PAF with medications and dietary changes using cues/reminders of education provided Outcome: Progressing   Problem: RH KNOWLEDGE DEFICIT Goal: RH STG INCREASE KNOWLEGDE OF HYPERLIPIDEMIA Description: Patient and family will be able to manage dyslipidemia with medications and dietary changes using handouts/educational materials independently Outcome: Progressing

## 2020-02-28 NOTE — Progress Notes (Signed)
Occupational Therapy Session Note  Patient Details  Name: Dustin Schneider MRN: [id number] Date of Birth: October 27, 1955  Today's Date: 02/28/2020 OT Individual Time: 0911-1007 OT Individual Time Calculation (min): 56 min    Short Term Goals: Week 4:  OT Short Term Goal 1 (Week 4): STG = LTG due to ELOS  Skilled Therapeutic Interventions/Progress Updates:    Treatment session with focus on NMR and functional transfers. Pt received seated in w/c. Completed oral care with set up, utilize RUE as a stabilizer without cueing. Pt reported urgent need for toileting and requesting urinal through gestures. Pt attempted to void without success. Engaged in discussion about set up of tub shower at pt's home. Set up shower based on pt's report in order to simulate home environment. Completed blocked practice squat pivot tub transfers with TTB with minA with max cues for sequencing. Able to fade cues during second transfer. Engaged in seated activity targeting WB through RUE at wrist in order to increase muscle activation through proprioceptive feedback to increase ADL independence. Significant other arrived to session, discussed plan for family education and educated significant other on functional application of intervention. Applied NMES to supraspinatus and middle deltoid to help approximate shoulder joint to reduce sublux and reduce pain.See below for E-stim parameters. Pt with no complaints of pain before or during stimulation. Incorporated pt AAROM of RUE, engaging in shoulder flexion and shoulder elevation. Noted trace shoulder elevation during NMES. Ended session with pt seated in w/c with all needs within reach and seat alarm on.  Ratio 1:3 Rate 35 pps Waveform- Asymmetric Ramp 1.0 Pulse 300 Intensity-20 Duration -8   Therapy Documentation Precautions:  Precautions Precautions: Fall Precaution Comments: pusher, dense R hemiparesis Restrictions Weight Bearing Restrictions: No  Pain:   No pain reported.   Therapy/Group: Individual Therapy  Michelle Nasuti 02/28/2020, 12:38 PM

## 2020-02-28 NOTE — Progress Notes (Signed)
Slept good. Continent B & B. Bladder scan=128cc's. Denies pain. Right WHO and right PRAFO applied at HS. Dustin Schneider A

## 2020-02-28 NOTE — Plan of Care (Signed)
  Problem: RH Dressing Goal: LTG Patient will perform upper body dressing (OT) Description: LTG Patient will perform upper body dressing with assist, with/without cues (OT). Flowsheets (Taken 02/28/2020 1542) LTG: Pt will perform upper body dressing with assistance level of: (downgraded) Minimal Assistance - Patient > 75% Note: Downgraded due decreased motor return in [place].

## 2020-02-29 ENCOUNTER — Inpatient Hospital Stay (HOSPITAL_COMMUNITY): Payer: BC Managed Care – PPO

## 2020-02-29 ENCOUNTER — Encounter (HOSPITAL_COMMUNITY): Payer: BC Managed Care – PPO | Admitting: Occupational Therapy

## 2020-02-29 ENCOUNTER — Encounter (HOSPITAL_COMMUNITY): Payer: BC Managed Care – PPO | Admitting: Speech Pathology

## 2020-02-29 ENCOUNTER — Ambulatory Visit (HOSPITAL_COMMUNITY): Payer: BC Managed Care – PPO

## 2020-02-29 LAB — BASIC METABOLIC PANEL
Anion gap: 9 (ref 5–15)
BUN: 17 mg/dL (ref 8–23)
CO2: 27 mmol/L (ref 22–32)
Calcium: 8.9 mg/dL (ref 8.9–10.3)
Chloride: 106 mmol/L (ref 98–111)
Creatinine, Ser: 0.91 mg/dL (ref 0.61–1.24)
GFR calc Af Amer: >60 mL/min (ref >60)
GFR calc non Af Amer: >60 mL/min (ref >60)
Glucose, Bld: 115 mg/dL — ABNORMAL HIGH (ref 70–99)
Potassium: 4.1 mmol/L (ref 3.5–5.1)
Sodium: 142 mmol/L (ref 135–145)

## 2020-02-29 LAB — CBC
HCT: 42.4 % (ref 39.0–52.0)
Hemoglobin: 13.7 g/dL (ref 13.0–17.0)
MCH: 31.1 pg (ref 26.0–34.0)
MCHC: 32.3 g/dL (ref 30.0–36.0)
MCV: 96.1 fL (ref 80.0–100.0)
Platelets: 184 10*3/uL (ref 150–400)
RBC: 4.41 MIL/uL (ref 4.22–5.81)
RDW: 13.2 % (ref 11.5–15.5)
WBC: 6.5 10*3/uL (ref 4.0–10.5)
nRBC: 0 % (ref 0.0–0.2)

## 2020-02-29 MED ORDER — ACETAMINOPHEN 325 MG PO TABS: 325.0000 mg | ORAL_TABLET | ORAL | Status: DC | PRN

## 2020-02-29 NOTE — Progress Notes (Signed)
Occupational Therapy Discharge Summary  Patient Details  Name: Dustin Schneider MRN: [id number] Date of Birth: 1955/10/05  Today's Date: 03/01/2020   Patient has met 29 of 13 long term goals due to improved activity tolerance, improved balance, postural control, ability to compensate for deficits, improved awareness and improved coordination.  Patient to discharge at [facility] Assist level.  Patient's care partner is independent to provide the necessary physical and cognitive assistance at discharge. [name] brother-in-law and significant other present for family education demonstrating proficiency in mobility and ADLs and with no further questions.  Reasons goals not met: n/a  Recommendation:  Patient will benefit from ongoing skilled OT services in outpatient setting to continue to advance functional skills in the area of BADL.  Equipment: drop arm BSC, TTB  Reasons for discharge: treatment goals met and discharge from hospital  Patient/family agrees with progress made and goals achieved: Yes  OT Discharge Precautions/Restrictions  Precautions Precautions: Fall Precaution Comments: dense R hemiparesis Restrictions Weight Bearing Restrictions: No General   Vital Signs Therapy Vitals Temp: 98.1 F (36.7 C) Pulse Rate: 90 Resp: 20 BP: 103/87 Patient Position (if appropriate): Sitting Oxygen Therapy SpO2: 98 % O2 Device: Room Air Pain Pain Assessment Pain Scale: 0-10 Pain Score: 0-No pain ADL ADL Eating: Moderate cueing, Supervision/safety Grooming: Supervision/safety Where Assessed-Grooming: Sitting at sink Upper Body Bathing: Minimal assistance Where Assessed-Upper Body Bathing: Wheelchair Lower Body Bathing: Minimal assistance Where Assessed-Lower Body Bathing: Wheelchair Upper Body Dressing: Minimal assistance Where Assessed-Upper Body Dressing: Wheelchair Lower Body Dressing: Minimal assistance Where Assessed-Lower Body Dressing: Wheelchair Toileting:  Minimal assistance Where Assessed-Toileting: Bedside Commode Toilet Transfer:  Minimal assistance Toilet Transfer Method: Engineer, water: Drop arm bedside commode Tub/Shower Transfer: Minimal assistance Tub/Shower Transfer Method: Squat pivot Tub/Shower Equipment: Radio broadcast assistant Vision Wears glasses Patient Visual Report: No change from baseline Vision Assessment?: No apparent visual deficits Perception  Perception: Impaired Inattention/Neglect: Other (comment) (inconsistently attends to right side of body, demonstrated improve RUE and RLE management) Praxis Praxis: Impaired Praxis Impairment Details: Motor planning Cognition Overall Cognitive Status: Impaired/Different from baseline Arousal/Alertness: Awake/alert Orientation Level: Oriented X4 Sustained Attention: Appears intact Memory: Appears intact Awareness: Impaired Problem Solving: Impaired Safety/Judgment: Impaired Comments: mild impulsivity Sensation Sensation Light Touch: Impaired by gross assessment Proprioception: Impaired by gross assessment Additional Comments: decreased in digits and wrist of RUE Coordination Gross Motor Movements are Fluid and Coordinated: No Fine Motor Movements are Fluid and Coordinated: No Coordination and Movement Description: grossly uncoordinated due to dense R hemiparesis, decreased postural control/balance, and LE weakness Finger Nose Finger Test: WFL on L UE; unable to perform on R UE Heel Shin Test: [facility] on L UE; unable to perform on R UE Motor  Motor Motor: Hemiplegia Motor - Skilled Clinical Observations: grossly uncoordinated due to dense R hemi, LE weakness, decreased balance/postural control Mobility  Bed Mobility Supine to Sit: Supervision/Verbal cueing Sit to Supine: Contact Guard/Touching assist Transfers Sit to Stand: Minimal Assistance - Patient > 75% Stand to Sit: Minimal Assistance - Patient > 75%  Trunk/Postural Assessment  Postural  Control Righting Reactions: delayed  Balance Dynamic Sitting Balance Sitting balance - Comments: mild R lateral lean is pt preference, able to correct with visual midline cuing Dynamic Standing Balance Dynamic Standing - Comments: dynamic standing balance impaired due to R hemiplegia Extremity/Trunk Assessment RUE Assessment RUE Assessment: Exceptions to [facility] Passive Range of Motion (PROM) Comments: [facility] General Strength Comments: 1/5 shoulder elevation/depression, 0/5 remainder of RUE RUE Body System: Neuro Brunstrum  levels for arm and hand: Arm;Hand Brunstrum level for arm: Stage II Synergy is developing Brunstrum level for hand: Stage I Flaccidity LUE Assessment LUE Assessment: Within Functional Limits   Michelle Nasuti 02/29/2020, 3:41 PM

## 2020-02-29 NOTE — Progress Notes (Signed)
Speech Language Pathology Daily Session Note  Patient Details  Name: Dustin Schneider MRN: [id number] Date of Birth: [birth year]  Today's Date: 02/29/2020 SLP Individual Time: 1030-1100 SLP Individual Time Calculation (min): 30 min  Short Term Goals: Week 4: SLP Short Term Goal 1 (Week 4): STG=LTG due to remaining length of stay  Skilled Therapeutic Interventions: Pt was seen for skilled ST targeting education with pt and his family (girlfriend and brother in law). SLP provided handout and verbal review of strategies for most effective communication, with models of sentence completion cues provided. Also discussed written and picture supports, and using word communication board at home for basic wants and needs. Verbal review and handout also provided for pt's current diet recommendations/strategies - regular textures, thin liquids but no straw, single sips, avoiding dual consistencies. All questions answered to pt and family's satisfaction. Pt left sitting in wheelchair with alarm set and needs within reach. Continue per current plan of care.         Pain Pain Assessment Pain Scale: 0-10 Pain Score: 0-No pain Faces Pain Scale: No hurt  Therapy/Group: Individual Therapy  Arbutus Leas 02/29/2020, 12:12 PM

## 2020-02-29 NOTE — Progress Notes (Signed)
  Occupational Therapy Session Note  Patient Details  Name: Dustin Schneider MRN: [id number] Date of Birth: Oct 03, 1955  Today's Date: 02/29/2020 OT Individual Time: 5449-2010 OT Individual Time Calculation (min): 59 min    Short Term Goals: Week 4:  OT Short Term Goal 1 (Week 4): STG = LTG due to ELOS  Skilled Therapeutic Interventions/Progress Updates:    Treatment session with focus on ADL retraining, family education, and functional transfers. Pt received semi-reclined in bed reporting need for shower. Sat at EOB with CGA and completed squat pivot with modA due to pt attempts to rush. Cued pt to pace appropriately and wait for OTS before completing transfer. Completed squat pivot transfer to TTB with minA, doffing clothing with supervision through use of lateral leans. Showered with close supervision. [name] brother-in-law arrived to session, educated family member on pt's shower routines, and on supporting RUE during functional movement. Completed squat pivot transfer with minA to w/c. Completed oral care seated in w/c. Demonstrated supporting RUE during grooming tasks; brother-in-law demonstrated proficiency. Dressed seated in w/c, donning shirt with minA while OTS educated brother-in-law on hemi-dressing techniques. Donned pants with minA to support dynamic standing balance and assist with advancing waistband above R hip. Engaged in discussion with brother-in-law about bathroom and kitchen home modifications, discussing that the provided DME and set-up of frequently used items to reduce need for reach would reduce need for any modifications at this time. Completed tub transfers with TTB with minA, educating brother-in-law on sequencing. Brother-in-law completed transfers in and out of TTB. Educated brother-in-law on blocking and positioning RLE during mobility, and on body positioning for sit <> stands in the shower. Ended session with pt and brother-in-law in rehab apartment with PT present.    Therapy Documentation Precautions:  Precautions Precautions: Fall Precaution Comments: pusher, dense R hemiparesis Restrictions Weight Bearing Restrictions: No General:   Vital Signs: Therapy Vitals Temp: 98.5 F (36.9 C) Pulse Rate: 77 Resp: 18 BP: 137/71 Patient Position (if appropriate): Lying Oxygen Therapy SpO2: 100 % Pain:  No pain reported.   Therapy/Group: Individual Therapy  Michelle Nasuti 02/29/2020, 7:25 AM

## 2020-02-29 NOTE — Progress Notes (Signed)
Physical Therapy Discharge Summary  Patient Details  Name: Dustin Schneider MRN: [id number] Date of Birth: 14-Sep-1955  Patient has met 7 of 11 long term goals due to improved activity tolerance, improved balance, improved postural control, increased strength, improved attention, improved awareness and improved coordination. Patient to discharge at a wheelchair level [place]. Patient's care partner is independent to provide the necessary physical assistance at discharge. [name] brother in law, Dustin Schneider, attended family education training on 7/19 and verbalized and demonstrated confidence with transfers to ensure safe discharge home. Therapist verbally educated [name] on technique for bumping up/down 1 step; he verbalized understanding and confidence with task. Therapist educated pt's family on avoiding ambulating at home for safety reasons; pt/family verbalized understanding. Pt/family with no further questions regarding mobility.   Reasons goals not met: Pt did not meet bed mobility goals due to dense R hemiparesis and need for min A for R LE management upon transferring sit<>supine. Pt did not meet ambulation goals and currently requires mod +2 to ambulate 73f with RW and/or handrail with total A to advance R LE, facilitate weight shifting, and control knee flexion/hyperextension. Pt did not meet stair goal due to dense R hemiparesis and currently requires mod A+2 to navigate 1 step with L handrail.   Recommendation:  Patient will benefit from ongoing skilled PT services in outpatient setting to continue to advance safe functional mobility, address ongoing impairments in functional mobility/transfers, generalized strengthening, dynamic standing balance/coordination, ambulation, NMR, motor control/sequencing, endurance, and to minimize fall risk.  Equipment: 18x18 manual WC, foam cushion, regular legrests, R half lap tray  Reasons for discharge: treatment goals met  Patient/family agrees with  progress made and goals achieved: Yes  PT Discharge Precautions/Restrictions Precautions Precautions: Fall Precaution Comments: dense R hemiparesis Restrictions Weight Bearing Restrictions: No Cognition Overall Cognitive Status: Impaired/Different from baseline Arousal/Alertness: Awake/alert Orientation Level: Oriented X4 Memory: Appears intact Awareness: Impaired Problem Solving: Impaired Safety/Judgment: Impaired Comments: mild impulsivity Sensation Sensation Light Touch: Impaired by gross assessment Proprioception: Impaired by gross assessment Additional Comments: decreased along R LE; WFL on L LE Coordination Gross Motor Movements are Fluid and Coordinated: No Fine Motor Movements are Fluid and Coordinated: No Coordination and Movement Description: grossly uncoordinated due to dense R hemiparesis, decreased postural control/balance, and LE weakness Finger Nose Finger Test: WFL on L UE; unable to perform on R UE Heel Shin [facility]: [facility]; unable to perform on R UE Motor  Motor Motor: Hemiplegia Motor - Skilled Clinical Observations: grossly uncoordinated due to dense R hemi, LE weakness, decreased balance/postural control  Mobility Bed Mobility Bed Mobility: Rolling Right;Rolling Left;Sit to Supine;Supine to Sit Rolling Right: Supervision/verbal cueing Rolling Left: Supervision/Verbal cueing Supine to Sit: Supervision/Verbal cueing Sit to Supine: Minimal Assistance - Patient > 75% Transfers Transfers: Sit to Stand;Stand to Sit;Squat Pivot Transfers;Stand Pivot Transfers Sit to Stand: Minimal Assistance - Patient > 75% Stand to Sit: Minimal Assistance - Patient > 75% Stand Pivot Transfers: Minimal Assistance - Patient > 75% Stand Pivot Transfer Details: Verbal cues for sequencing;Verbal cues for technique;Verbal cues for precautions/safety Stand Pivot Transfer Details (indicate cue type and reason): verbal cues for hand/foot placement and technique Squat Pivot  Transfers: Minimal Assistance - Patient > 75% Transfer (Assistive device): None Locomotion  Gait Ambulation: Yes Gait Assistance: 2 Helpers Gait Distance (Feet): 70 Feet Assistive device: Rolling walker;Other (Comment) (L handrail) Gait Assistance Details: Manual facilitation for weight shifting;Manual facilitation for placement;Verbal cues for technique;Manual facilitation for weight bearing;Tactile cues for weight beaing;Verbal  cues for sequencing;Verbal cues for safe use of DME/AE;Verbal cues for gait pattern;Verbal cues for precautions/safety Gait Assistance Details: manual facilitation for weight shifting, to block R knee buckling/hyperextension, and for R LE foot advancement. Gait Gait: Yes Gait Pattern: Impaired Gait Pattern: Decreased stance time - right;Decreased dorsiflexion - right;Decreased stride length;Decreased step length - right;Decreased step length - left;Decreased weight shift to left;Lateral trunk lean to right;Poor foot clearance - left;Poor foot clearance - right;Decreased trunk rotation;Step-through pattern Gait velocity: decreased Stairs / Additional Locomotion Curb: 2 Designer, jewellery: Yes Wheelchair Assistance: Chartered loss adjuster: Left upper extremity;Left lower extremity Wheelchair Parts Management: Needs assistance Distance: 19f  Trunk/Postural Assessment  Cervical Assessment Cervical Assessment: Within Functional Limits Thoracic Assessment Thoracic Assessment: Ex[facility] to [facility](rounded shoulders) Lumbar Assessment Lumbar Assessment: Exceptions to [facility](posterior pelvic tilt) Postural Control Postural Control: Deficits on evaluation  Balance Balance Balance Assessed: Yes Static Sitting Balance Static Sitting - Balance Support: Feet supported;No upper extremity supported Static Sitting - Level of Assistance: 7: Independent Dynamic Sitting Balance Dynamic Sitting - Balance Support: Feet  supported;Left upper extremity supported Dynamic Sitting - Level of Assistance: 7: Independent Static Standing Balance Static Standing - Balance Support: Right upper extremity supported;Left upper extremity supported Static Standing - Level of Assistance: 4: Min assist Dynamic Standing Balance Dynamic Standing - Balance Support: Left upper extremity supported;Right upper extremity supported Dynamic Standing - Level of Assistance: 4: Min assist Extremity Assessment  RLE Assessment RLE Assessment: Exceptions to [facility] Strength Comments: unable to facilitate active muscle contractions due to dense R hemi; able to palpate hip flexor and hamstring to feel contractions (1+/5) LLE Assessment LLE Assessment: Exceptions to [facility] Strength Comments: grossly generalized to 4/5  AAlfonse AlpersPT, DPT  02/29/2020, 7:41 AM

## 2020-02-29 NOTE — Progress Notes (Signed)
Physical Therapy Session Note  Patient Details  Name: Dustin Schneider MRN: [id number] Date of Birth: January 23, 1956  Today's Date: 02/29/2020 PT Individual Time: 3729-0211 and 1345-1455 PT Individual Time Calculation (min): 43 min and 70 min  Short Term Goals: Week 3:  PT Short Term Goal 1 (Week 3): Pt will perform bed mobility with min A PT Short Term Goal 1 - Progress (Week 3): Met PT Short Term Goal 2 (Week 3): Pt will ambulate 62f with LRAD max A of 1 PT Short Term Goal 2 - Progress (Week 3): Progressing toward goal PT Short Term Goal 3 (Week 3): Pt will navigate 1 step with LRAD max A PT Short Term Goal 3 - Progress (Week 3): Met Week 4:  PT Short Term Goal 1 (Week 4): = to LTGs based on ELOS  Skilled Therapeutic Interventions/Progress Updates:   Treatment Session 1: 0947-1030 43 min Received pt sitting in WC in rehab apartment with OT, pt agreeable to therapy, and denied any pain during session. [name] brother in law, WRedmond Pulling present for family education training and pt's girlfriend present at end of session. Session with emphasis on discharge planning, functional mobility/transfers, generalized strengthening, dynamic standing balance/coordination, simulated car transfers, and improved activity tolerance. [name] with questions regarding swelling in R LE. Educated and provided pt with compression stocking and donned one total A. Pt [facility] Pt performed simulated car transfer x 2 trials with min A. Trial 1: with therapist and trial 2: with [name]. PT educated [name] on technique and hand placement with emphasis on blocking R knee and controlling at hips/pelvis. WRedmond Pullingreported that pt may have 1 6in step to enter home. Attempted to practice bumping up/down curb to simulate home environment however, pt's anti-tippers were too low and unable to be adjusted. Therapist verbally and visually explained technique for bumping up/down steps and [name] verbalized confidence  and understanding. Practiced blocked transfers to/from mat in therapy gym to simulate transfers to various surfaces at home. Pt transferred WC<>mat and mat<>WC stand/squat<>pivot with min A from [name] with questions regarding scheduling for OPPT (CSW notified). Pt transported back to room in [facility]. PT educated pt's family on importance of avoiding ambulating at home for safety reasons; pt's family in agreement. Concluded session with pt sitting in WC, needs within reach, and seatbelt alarm on. Family present at bedside awaiting SLP session.  Treatment Session 2: 1345-1455 70 min Received pt sitting in WC, pt agreeable to therapy, and denied any pain during session. Session with emphasis on functional mobility/transfers, generalized strengthening, dynamic standing balance/coordination, gait training, NMR, motor control/sequencing, and improved activity tolerance. Pt transferred WC<>bed squat<>pivot min A x 2 trials and transferred sit<>supine with min A for R LE management and supine<>sit with supervision and increased time. Pt performed WC mobility 106fusing L hemi technique and supervision to dayroom. Donned LiteGait harness seated with max A and pt transferred sit<>stand min A and required +2 assist to fasten harness. Donned R DF ace wrap. Pt stepped up/down from LiteGait with mod A +2 for safety with L UE support on handrail. Pt performed gait training on LiteGait treadmill for the following time frames with max A of 1 with +2 to manage treadmill speed: -2536ft 0.2 mph with R LE ace wrap -24f61f 0.3mph5mth R LE ace wrap and stockinet.  Pt required max A to advance R LE, control knee hyperextension/buckling, and to facilitate weight shifting at pelvis. Pt requested to  continue gait training in hallway with RW. Pt ambulated additional 38f x 2 trials with RW mod A +2 for close WC follow with R UE hand orthosis with R LE ace wrap and leg loop. Pt required the same cues and facilitation  listed above. Therapist provided pt with HEP consisting of the following exercises and educated pt on frequency, duration, and technique per pt's girlfriend's request: -Seated Knee Flexion - 1 x daily - 7 x weekly - 3 sets - 10 reps -Seated March - 1 x daily - 7 x weekly - 3 sets - 10 reps -Seated Long Arc Quad - 1 x daily - 7 x weekly - 3 sets - 10 reps -Seated Hip Abduction with Resistance - 1 x daily - 7 x weekly - 3 sets - 10 reps Pt [facility]. Concluded session with pt sitting in WC, needs within reach, and seatbelt alarm on.    Therapy Documentation Precautions:  Precautions Precautions: Fall Precaution Comments: pusher, dense R hemiparesis Restrictions Weight Bearing Restrictions: No  Therapy/Group: Individual Therapy AAlfonse AlpersPT, DPT   02/29/2020, 7:25 AM

## 2020-02-29 NOTE — Progress Notes (Signed)
Patient ID: Dustin Schneider, male   DOB: 1955/11/14, 64 y.o.   MRN: [id number]   Sw followed up with patient to addressed nay questions or concerns. Patient appeared to want to express something, after a few gestures. Sw was able to make out that patient has a questions for physician about his uriniry retention medication. Sw will follow up with physician.

## 2020-02-29 NOTE — Care Management (Signed)
Patient ID: Dustin Schneider, male   DOB: 1956/06/19, 64 y.o.   MRN: [id number]  Follow up meeting with the patient regarding secondary stroke risks. Nurse reported review of medications with friend and questions for discharge. No questions noted regarding DASH diet or management of dyslipidemia. Patient is continent of B+B and on a regular diet. Concerns regarding issues with communication; patient acknowledges follow up SLP in addition to PT and OT outpatient services. Notes he is ready to go home, however not sure (shoulder shrug) regarding readiness/preparedness for discharge. Denied questions at present re medications or information/handout/handbooks given. Margarito Liner

## 2020-02-29 NOTE — Progress Notes (Signed)
[place] PHYSICAL MEDICINE & REHABILITATION PROGRESS NOTE  Subjective/Complaints: Patient seen sitting up in bed this morning.  He indicates he slept well overnight.  He indicates improving strength in right shoulder.  He appears to want to express something, but is unable to.  ROS: Denies CP, SOB, N/V/D  Objective: Vital Signs: Blood pressure 103/87, pulse 90, temperature 98.1 F (36.7 C), resp. rate 20, height 5\' 9"  (1.753 m), weight 77 kg, SpO2 98 %. No results found. Recent Labs    02/29/20 0713  WBC 6.5  HGB 13.7  HCT 42.4  PLT 184   Recent Labs    02/29/20 0713  NA 142  K 4.1  CL 106  CO2 27  GLUCOSE 115*  BUN 17  CREATININE 0.91  CALCIUM 8.9    Physical Exam: BP 103/87 (BP Location: Left Arm)   Pulse 90   Temp 98.1 F (36.7 C)   Resp 20   Ht 5\' 9"  (1.753 m)   Wt 77 kg   SpO2 98%   BMI 25.07 kg/m  Constitutional: No distress . Vital signs reviewed. HENT: Normocephalic.  Atraumatic. Eyes: EOMI. No discharge. Cardiovascular: No JVD.  Irregularly irregular. Respiratory: Normal effort.  No stridor.  Bilaterally clear to auscultation. GI: Non-distended.  BS +. Skin: Warm and dry.  Intact. Psych: Normal mood.  Normal behavior. Musc: Mild left-sided edema.  No tenderness in extremities. Neuro: Alert Expressive, relatively stable Right facial weakness, unchanged. Motor: RUE: Shoulder abduction 1+/5, distally 0/5  RLE: Hip flexion, knee extension 2+-3-/5, distally remains 0/5 No increase in tone  Assessment/Plan: 1. Functional deficits secondary to left MCA infarct which require 3+ hours per day of interdisciplinary therapy in a comprehensive inpatient rehab setting.  Physiatrist is providing close team supervision and 24 hour management of active medical problems listed below.  Physiatrist and rehab team continue to assess barriers to discharge/monitor patient progress toward functional and medical goals  Care Tool:  Bathing    Body parts bathed  by patient: Right arm, Chest, Abdomen, Front perineal area, Left upper leg, Face, Right upper leg, Right lower leg, Left lower leg, Left arm   Body parts bathed by helper: Left arm     Bathing assist Assist Level: Supervision/Verbal cueing     Upper Body Dressing/Undressing Upper body dressing   What is the patient wearing?: Pull over shirt    Upper body assist Assist Level: Minimal Assistance - Patient > 75%    Lower Body Dressing/Undressing Lower body dressing      What is the patient wearing?: Pants, Incontinence brief     Lower body assist Assist for lower body dressing: Minimal Assistance - Patient > 75%     Toileting Toileting    Toileting assist Assist for toileting: Set up assist Assistive Device Comment: urinal   Transfers Chair/bed transfer  Transfers assist  Chair/bed transfer activity did not occur: Safety/medical concerns  Chair/bed transfer assist level: Minimal Assistance - Patient > 75%     Locomotion Ambulation   Ambulation assist      Assist level: 2 helpers (+2 mod assist) Assistive device: Hand held assist Max distance: 89ft   Walk 10 feet activity   Assist  Walk 10 feet activity did not occur: Safety/medical concerns  Assist level: 2 helpers (+2 mod assist) Assistive device: Hand held assist   Walk 50 feet activity   Assist Walk 50 feet with 2 turns activity did not occur:  (didn't perform turns)  Assist level: 2 helpers Assistive device:  Maxi Sky    Walk 150 feet activity   Assist Walk 150 feet activity did not occur: Safety/medical concerns (R hemi, LE weakness, decreased balance/postural control)         Walk 10 feet on uneven surface  activity   Assist Walk 10 feet on uneven surfaces activity did not occur: Safety/medical concerns (R hemi, LE weakness, decreased balance/postural control)         Wheelchair     Assist Will patient use wheelchair at discharge?: Yes Type of Wheelchair: Manual     Wheelchair assist level: Minimal Assistance - Patient > 75% Max wheelchair distance: >146ft    Wheelchair 50 feet with 2 turns activity    Assist        Assist Level: Minimal Assistance - Patient > 75%   Wheelchair 150 feet activity     Assist     Assist Level: Minimal Assistance - Patient > 75%      Medical Problem List and Plan: 1. Right hemiplegia, impaired mobility and ADLs secondary to left MCA ischemic stroke  Continue CIR   WHO/PRAFO nightly 2.  Antithrombotics: -DVT/anticoagulation:  Pharmaceutical: Other (comment)--Eliquis             -antiplatelet therapy: N/A 3. Pain Management: Tylenol prn.  4. Mood: LCSW to follow for evaluation and support.              -antipsychotic agents: N/A 5. Neuropsych: This patient is not capable of making decisions on his own behalf.  Trial of amantadine started on 6/26, discussed with therapies, DC'd on 7/1 6. Skin/Wound Care: N/A 7. Fluids/Electrolytes/Nutrition: Monitor I/Os. 8. PAF: Monitor HR tid--has been off Tikosyn since admission. Continue metoprolol bid with Eliquis.   Per cardiology, continue to monitor-no need for Tikosyn at present   Vitals:   02/29/20 0457 02/29/20 1321  BP: 137/71 103/87  Pulse: 77 90  Resp: 18 20  Temp: 98.5 F (36.9 C) 98.1 F (36.7 C)  SpO2: 100% 98%   Rate controlled on 7/19 9. HTN: Monitor BP--managed on metoprolol   Avapro decreased to 75 on 7/1, decreased to 37.5 on 7/6, DC'd on 7/15  Slightly labile on 7/19 10. Dyslipidemia: Continue Crestor. 11.  Post stroke dysphagia:   Advanced to regular thins with accommodations  NG DC'd  Advance diet as tolerated  12. Impaired fasting glucose: Resolved  Within normal limits on 7/9  Continue to monitor 13. Nonischemic cardiomyopathy: outpatient cardiology follow-up. 14.  Hypoalbuminemia  Supplement initiated on 6/23 15.  Leukocytosis: Resolved  Afebrile  Continue to monitor 16.  AKI:   Creatinine 0.91 on 7/19  Encourage  fluids  Continue to monitor 17. Constipation  Improving 18.  Urinary retention   Flomax started on 6/30  Urecholine started, increased on 7/7, increased again on 7/10  Voiding with minimal residual  Improving 19.  Spasticity , increased tone noted per PT, knee flexors on Right  Low dose tizanidine started  Improved  LOS: 27 days A FACE TO FACE EVALUATION WAS PERFORMED  Sherman Lipuma [name] 02/29/2020, 1:58 PM

## 2020-02-29 NOTE — Progress Notes (Signed)
Speech Language Pathology Discharge Summary  Patient Details  Name: Dustin Schneider MRN: [id number] Date of Birth: 1955-10-21  Today's Date: 03/01/2020 SLP Individual Time:  - 1000-1030     Skilled Therapeutic Interventions: Skilled ST services focused on education and language skills. SLP facilitated recall of diet and communication strategies addressed by primary SLP in pervious session with son and girlfriend. Family demonstrated ability to recall instructions Mod I and asked further questions about diet. All questions were answered to satisfaction.  SLP facilitated use of written language to aid in communication, pt demonstrated ability to write given dictation at 3 to 5 letter words with min A verbal cues to correct errors. Pt was able to write son's name mod I and girlfriends' name with written model. SLP reviewed use of sentence completion cues, as well as increasing visual aids to assist in communication breakdown. Pt was left in room with family, call bell within reach and chair alarm set. ST recommends to continue skilled ST services.     Patient has met 7 of 7 long term goals.  Patient to discharge at overall Modified Independent;Min level.  Reasons goals not met: n/a   Clinical Impression/Discharge Summary:   Pt made excellent functional gains and met 7 out of 7 long term goals this admission. Pt currently requires Min assist for communicating his basic wants and needs via a word communication board, as well as use of gestures. Increased Mod A cues required for verbal expression at word and phrase level due to expressive aphasia complicated by apraxia of speech which leads to frequent phonemic paraphasias that are inconsistent, but pt is mostly aware of. He responds very well to written and sentence completion cues, and repetition ability has improved drastically when provided models by SLP. His ability to participate in automatic speech tasks has also improved. Pt is consuming regular  texture diet with thin liquids, with the caveat that pt should not use straws and he should also avoid dual consistencies, including with medication administration (should be given whole in applesauce). He is Mod I for use of his swallow strategies and only required set up assistance for meals. Pt also demonstrated ability to detect functional errors and problem solve basic familiar situations with no more than Supervision A; he is able to attend to functional tasks without need for interventions, although executive functioning and higher level problem solving skills should be further evaluated at next level of care; focus of pt's treatment while in CIR has focused on communication and dysphagia interventions, due to severity of those deficits, with greatest functional impact. Pt will required 24/7 supervision at discharge for greatest safety and recommend pt continue to receive skilled ST services in outpatient setting. Pt and family education is complete at this time.    Care Partner:  Caregiver Able to Provide Assistance: Yes  Type of Caregiver Assistance: Cognitive  Recommendation:  24 hour supervision/assistance;Outpatient SLP  Rationale for SLP Follow Up: Maximize functional communication;Maximize cognitive function and independence;Reduce caregiver burden   Equipment: none   Reasons for discharge: Discharged from hospital   Patient/Family Agrees with Progress Made and Goals Achieved: Yes    Arbutus Leas 02/29/2020, 7:29 AM

## 2020-03-01 ENCOUNTER — Ambulatory Visit (HOSPITAL_COMMUNITY): Payer: BC Managed Care – PPO | Admitting: Physical Therapy

## 2020-03-01 ENCOUNTER — Inpatient Hospital Stay (HOSPITAL_COMMUNITY): Payer: BC Managed Care – PPO | Admitting: Occupational Therapy

## 2020-03-01 ENCOUNTER — Encounter (HOSPITAL_COMMUNITY): Payer: BC Managed Care – PPO | Admitting: Occupational Therapy

## 2020-03-01 ENCOUNTER — Inpatient Hospital Stay (HOSPITAL_COMMUNITY): Payer: BC Managed Care – PPO

## 2020-03-01 ENCOUNTER — Encounter (HOSPITAL_COMMUNITY): Payer: BC Managed Care – PPO

## 2020-03-01 MED ORDER — BETHANECHOL CHLORIDE 25 MG PO TABS
25.0000 mg | ORAL_TABLET | Freq: Three times a day (TID) | ORAL | Status: DC
  Administered 2020-03-01 – 2020-03-02 (×3): 25 mg via ORAL
  Filled 2020-03-01 (×3): qty 1

## 2020-03-01 NOTE — Progress Notes (Signed)
Patient ID: Dustin Schneider, male   DOB: [birth year], 64 y.o.   MRN: [id number]   Patient friend [name]) unable to go to DME store to pick on lap tray and anti tippers. Patients brother willing to pick up DME at [facility] store

## 2020-03-01 NOTE — Progress Notes (Signed)
Physical Therapy Session Note  Patient Details  Name: Dustin Schneider MRN: [id number] Date of Birth: [birth year]  Today's Date: 03/01/2020 PT Individual Time: 0900-1000 PT Individual Time Calculation (min): 60 min   Short Term Goals: Week 4:  PT Short Term Goal 1 (Week 4): = to LTGs based on ELOS  Skilled Therapeutic Interventions/Progress Updates:    Pt received seated in w/c in room with brother-in-law [name] present for hands-on family education. [name] significant other [name] also arrives during therapy session for family education. Demonstrated how to perform safe car transfer via squat pivot w/c to/from sedan height car with min A with R knee blocked and providing assist at pt's hips. [name] concerned as she has a Honda CRV with a higher seat height. Demonstrated how to perform a transfer w/c to/from elevated car seat height and [name] able to perform return demo of assisting pt with transfer. Reviewed management of w/c parts including donning/doffing w/c leg rests, brake management, armrest management, and folding w/c up for transport in the car. [name] family able to perform return demo and demonstrate good understanding of w/c management. Demonstrated how to turn around anti-tippers on w/c and how to bump a w/c up/down a curb step. Squat pivot transfer w/c to/from 26" tall bed height to simulate pt's home environment. Education with pt's family that a shorter height bed would be safer as he may slide forwards off the bed due to height and also will have increased difficulty with this transfer. Squat pivot transfer to/from recliner chair to simulate pt's chair at home with min A. Discussed safe placement of w/c and blocking of pt's R knee during transfer. [name] family overall demonstrates good understanding of how best to assist him with various types of transfers at home as well as performed return demonstrations of assisting the pt with these transfers. [name] family asking about his R ankle as they have  noticed increased swelling and pt had pre-morbid instability in this ankle. Notified Algis Liming, PA of family's concerns and she will address. Pt left seated in w/c in room with needs in reach, quick release belt and chair alarm in place at end of session.  Therapy Documentation Precautions:  Precautions Precautions: Fall Precaution Comments: dense R hemiparesis Restrictions Weight Bearing Restrictions: No    Therapy/Group: Individual Therapy   Excell Seltzer, PT, DPT  03/01/2020, 12:17 PM

## 2020-03-01 NOTE — Progress Notes (Signed)
Patient ID: Dustin Schneider, male   DOB: March 11, 1956, 64 y.o.   MRN: [id number]   Patient set up with [facility] for PCP follow up. Appointment set for: August 17th at 8:50 AM, with Juluis Mire, NP.

## 2020-03-01 NOTE — Progress Notes (Signed)
Patient ID: Dustin Schneider, male   DOB: 09/13/1955, 64 y.o.   MRN: [id number]   Sw left [name] a voicemail about picking up patient's lap tray and anti-tippers. Waiting on call back

## 2020-03-01 NOTE — Progress Notes (Signed)
[place] PHYSICAL MEDICINE & REHABILITATION PROGRESS NOTE  Subjective/Complaints: Patient seen sitting up in bed this morning.  He indicates he slept fairly overnight.  He has urinary retention written on his board and tries to explain, but unable to ascertain specific question.  Discussed with nursing, who states patient may be requesting decrease in bladder medications.  ROS: Denies CP, SOB, N/V/D  Objective: Vital Signs: Blood pressure 131/77, pulse 73, temperature 98.6 F (37 C), resp. rate 18, height 5\' 9"  (1.753 m), weight 77 kg, SpO2 97 %. No results found. Recent Labs    02/29/20 0713  WBC 6.5  HGB 13.7  HCT 42.4  PLT 184   Recent Labs    02/29/20 0713  NA 142  K 4.1  CL 106  CO2 27  GLUCOSE 115*  BUN 17  CREATININE 0.91  CALCIUM 8.9    Physical Exam: BP 131/77   Pulse 73   Temp 98.6 F (37 C)   Resp 18   Ht 5\' 9"  (1.753 m)   Wt 77 kg   SpO2 97%   BMI 25.07 kg/m  Constitutional: No distress . Vital signs reviewed. HENT: Normocephalic.  Atraumatic. Eyes: EOMI. No discharge. Cardiovascular: No JVD.  Irregularly irregular. Respiratory: Normal effort.  No stridor.  Bilaterally clear to auscultation. GI: Non-distended.  BS +. Skin: Warm and dry.  Intact. Psych: Normal mood.  Normal behavior. Musc: Mild left-sided edema.  No tenderness in extremities. Neuro: Alert Expressive, very slowly improving Right facial weakness, unchanged. Motor: RUE: Shoulder abduction 1+/5, distally 0/5  RLE: Hip flexion, knee extension 2+-3-/5, distally 0/5, unchanged No increase in tone  Assessment/Plan: 1. Functional deficits secondary to left MCA infarct which require 3+ hours per day of interdisciplinary therapy in a comprehensive inpatient rehab setting.  Physiatrist is providing close team supervision and 24 hour management of active medical problems listed below.  Physiatrist and rehab team continue to assess barriers to discharge/monitor patient progress toward  functional and medical goals  Care Tool:  Bathing    Body parts bathed by patient: Right arm, Chest, Abdomen, Front perineal area, Left upper leg, Face, Right upper leg, Right lower leg, Left lower leg   Body parts bathed by helper: Left arm     Bathing assist Assist Level: Supervision/Verbal cueing     Upper Body Dressing/Undressing Upper body dressing   What is the patient wearing?: Pull over shirt    Upper body assist Assist Level: Minimal Assistance - Patient > 75%    Lower Body Dressing/Undressing Lower body dressing      What is the patient wearing?: Pants, Incontinence brief     Lower body assist Assist for lower body dressing: Minimal Assistance - Patient > 75%     Toileting Toileting    Toileting assist Assist for toileting: Set up assist Assistive Device Comment: urinal   Transfers Chair/bed transfer  Transfers assist  Chair/bed transfer activity did not occur: Safety/medical concerns  Chair/bed transfer assist level: Minimal Assistance - Patient > 75%     Locomotion Ambulation   Ambulation assist      Assist level: 2 helpers Assistive device: Walker-rolling Max distance: 48ft   Walk 10 feet activity   Assist  Walk 10 feet activity did not occur: Safety/medical concerns  Assist level: 2 helpers Assistive device: Walker-rolling   Walk 50 feet activity   Assist Walk 50 feet with 2 turns activity did not occur:  (didn't perform turns)  Assist level: 2 helpers Assistive device: [employer]  Walk 150 feet activity   Assist Walk 150 feet activity did not occur: Safety/medical concerns (R hemi, fatigue, LE weakness, decreased postural control)         Walk 10 feet on uneven surface  activity   Assist Walk 10 feet on uneven surfaces activity did not occur: Safety/medical concerns (R hemi, fatigue, LE weakness, decreased postural control)         Wheelchair     Assist Will patient use wheelchair at discharge?:  Yes Type of Wheelchair: Manual    Wheelchair assist level: Supervision/Verbal cueing Max wheelchair distance: 162ft    Wheelchair 50 feet with 2 turns activity    Assist        Assist Level: Supervision/Verbal cueing   Wheelchair 150 feet activity     Assist     Assist Level: Supervision/Verbal cueing      Medical Problem List and Plan: 1. Right hemiplegia, impaired mobility and ADLs secondary to left MCA ischemic stroke  Continue CIR   WHO/PRAFO nightly 2.  Antithrombotics: -DVT/anticoagulation:  Pharmaceutical: Other (comment)--Eliquis             -antiplatelet therapy: N/A 3. Pain Management: Tylenol prn.  4. Mood: LCSW to follow for evaluation and support.              -antipsychotic agents: N/A 5. Neuropsych: This patient is not capable of making decisions on his own behalf.  Trial of amantadine started on 6/26, discussed with therapies, DC'd on 7/1 6. Skin/Wound Care: N/A 7. Fluids/Electrolytes/Nutrition: Monitor I/Os. 8. PAF: Monitor HR tid--has been off Tikosyn since admission. Continue metoprolol bid with Eliquis.   Per cardiology, continue to monitor-no need for Tikosyn at present   Vitals:   02/29/20 1921 03/01/20 0520  BP: 119/87 131/77  Pulse: 94 73  Resp: 18 18  Temp: 99.8 F (37.7 C) 98.6 F (37 C)  SpO2: 96% 97%   Rate controlled on 7/20 9. HTN: Monitor BP--managed on metoprolol   Avapro decreased to 75 on 7/1, decreased to 37.5 on 7/6, DC'd on 7/15  Relatively controlled on 7/20 10. Dyslipidemia: Continue Crestor. 11.  Post stroke dysphagia:   Advanced to regular thins with accommodations  NG DC'd  Advance diet as tolerated  12. Impaired fasting glucose: Resolved  Within normal limits on 7/9  Continue to monitor 13. Nonischemic cardiomyopathy: outpatient cardiology follow-up. 14.  Hypoalbuminemia  Supplement initiated on 6/23 15.  Leukocytosis: Resolved  Afebrile  Continue to monitor 16.  AKI: Improving  Creatinine 0.91 on  7/19  Encourage fluids  Continue to monitor 17. Constipation  Improving 18.  Urinary retention   Flomax started on 6/30  Urecholine started, increased on 7/7, increased again on 7/10, trial decreasing on 7/20  Voiding with minimal residual  Improving 19.  Spasticity , increased tone noted per PT, knee flexors on Right  Low dose tizanidine started  Improved  LOS: 28 days A FACE TO FACE EVALUATION WAS PERFORMED  Jamarques Pinedo [name] 03/01/2020, 9:22 AM

## 2020-03-01 NOTE — Progress Notes (Signed)
Patient ID: Dustin Schneider, male   DOB: 1955/10/20, 64 y.o.   MRN: [id number]   [place] coming up to change patient lap tray, will also check anti tippers.

## 2020-03-01 NOTE — Progress Notes (Signed)
Occupational Therapy Session Note  Patient Details  Name: Dustin Schneider MRN: [id number] Date of Birth: [birth year]  Today's Date: 03/01/2020 OT Individual Time: 8325-4982 OT Individual Time Calculation (min): 68 min    Short Term Goals: Week 4:  OT Short Term Goal 1 (Week 4): STG = LTG due to ELOS  Skilled Therapeutic Interventions/Progress Updates:    Treatment session with focus on family education, functional transfers and sit <> stands. Pt received seated in w/c with family and SLP present. Completed tub transfers with both [name] (brother in law) and [name] (significant other) while providing education on sequencing of transfer and blocking R knee to prevent hyperextension. Provided cueing and demonstration on increasing anterior weight shift and on hand placement to decrease assist level. Engaged in discussion about bedroom accessibility, discussing potential compensatory strategies to operate lights and utilize Network engineer, educating on placing commonly used items within reach. Engaged in toilet transfers with [facility] with minA to demonstrate sequencing and drop arm BSC management. [name] and [place] completed transfers with cueing from OTS to increase anterior weight shift, block R knee, and manage BSC. Both family members completed toilet transfers without cueing with not further questions about toilet transfers. Completed sit <> stands with minA to simulate clothing management, demonstrating positioning. Pt stood with both family members, with OTS able to fade cueing. Family completed w/c to bed transfers with minimal cueing to facilitate increased weight shifting during transfers. Educated on spasticity management of RLE and HEP for shoulder and positioning RUE to decrease edema. Ended session with pt seated in w/c with family present and all needs within reach.  Therapy Documentation Precautions:  Precautions Precautions: Fall Precaution Comments: dense R hemiparesis Restrictions Weight  Bearing Restrictions: No General:   Vital Signs: Therapy Vitals Temp: 98.6 F (37 C) Pulse Rate: 73 Resp: 18 BP: 131/77 Patient Position (if appropriate): Lying Oxygen Therapy SpO2: 97 % Pain: Pain Assessment Pain Scale: 0-10 Pain Score: 0-No pain  Therapy/Group: Individual Therapy  Michelle Nasuti 03/01/2020, 7:16 AM

## 2020-03-01 NOTE — Progress Notes (Signed)
Occupational Therapy Session Note  Patient Details  Name: Dustin Schneider MRN: [id number] Date of Birth: [birth year]  Today's Date: 03/01/2020 OT Individual Time: 4360-6770 OT Individual Time Calculation (min): 50 min    Short Term Goals: Week 4:  OT Short Term Goal 1 (Week 4): STG = LTG due to ELOS  Skilled Therapeutic Interventions/Progress Updates:    Treatment session with focus on NMR and sublux and edema management. Pt received seated in w/c. Assessed sensation of RUE and educated pt on safety awareness when engaging in functional activities involving RUE due to decreased sensation. Applied NMES to supraspinatus and middle deltoid to help approximate shoulder joint to reduce sublux and reduce pain.See below for E-stim parameters. Pt with no complaints of pain before or during stimulation. Stabilized at wrist and elbow to eliminate gravity and support shoulder. Engaged in [place] of shoulder flexion, elevation, depression, abduction, and internal rotation. Noted trace shoulder elevation and depression, and tricep activation. Provided elastic shoe laces to increase independence during LB dressing and educated on use. Educated on and applied kinesiotape to increase shoulder support to prevent subluxation and to reduce edema of RUE. Ended session with pt seated in w/c with all needs within reach and seat alarm on.  Ratio 1:3 Rate 35 pps Waveform- Asymmetric Ramp 1.0 Pulse 300 Intensity-24 for 6 minutes and 28 for 4 Duration -10  Therapy Documentation Precautions:  Precautions Precautions: Fall Precaution Comments: dense R hemiparesis Restrictions Weight Bearing Restrictions: No General:   Vital Signs:   Pain: Pain Assessment Faces Pain Scale: No hurt  Therapy/Group: Individual Therapy  Michelle Nasuti 03/01/2020, 3:21 PM

## 2020-03-02 MED ORDER — PRO-STAT SUGAR FREE PO LIQD: 30.0000 mL | Freq: Two times a day (BID) | ORAL | 0 refills | Status: DC

## 2020-03-02 MED ORDER — TAMSULOSIN HCL 0.4 MG PO CAPS: 0.4000 mg | ORAL_CAPSULE | Freq: Every day | ORAL | 0 refills | Status: DC

## 2020-03-02 MED ORDER — TIZANIDINE HCL 2 MG PO TABS: 2.0000 mg | ORAL_TABLET | Freq: Three times a day (TID) | ORAL | 0 refills | Status: DC

## 2020-03-02 MED ORDER — ROSUVASTATIN CALCIUM 10 MG PO TABS: 10.0000 mg | ORAL_TABLET | Freq: Every day | ORAL | 0 refills | Status: DC

## 2020-03-02 MED ORDER — APIXABAN 5 MG PO TABS
5.0000 mg | ORAL_TABLET | Freq: Two times a day (BID) | ORAL | 0 refills | Status: DC
Start: 2020-03-02 — End: 2020-04-04

## 2020-03-02 MED ORDER — BETHANECHOL CHLORIDE 25 MG PO TABS: 25.0000 mg | ORAL_TABLET | Freq: Three times a day (TID) | ORAL | 0 refills | Status: DC

## 2020-03-02 MED ORDER — METOPROLOL TARTRATE 25 MG PO TABS: 25.0000 mg | ORAL_TABLET | Freq: Two times a day (BID) | ORAL | 0 refills | Status: DC

## 2020-03-02 NOTE — Plan of Care (Signed)
Pt to discharge home today

## 2020-03-02 NOTE — Discharge Summary (Addendum)
Physician Discharge Summary  Patient ID: KENYATTE CHATMON MRN: [id number] DOB/AGE: 1956-06-05 64 y.o.  Admit date: 02/02/2020 Discharge date: 03/02/2020  Discharge Diagnoses:  Principal Problem:   Acute ischemic left MCA stroke [facility]) Active Problems:   AKI (acute kidney injury) ([place])   Hypoalbuminemia due to protein-calorie malnutrition (HCC)   Dysphagia, post-stroke   PAF (paroxysmal atrial fibrillation) (HCC)   History of hypertension   Expressive aphasia   Spastic hemiplegia affecting nondominant side (HCC)   Discharged Condition: stable   Significant Diagnostic Studies: DG Ankle Complete Right  Result Date: 03/01/2020 CLINICAL DATA:  Ankle swelling. EXAM: RIGHT ANKLE - COMPLETE 3+ VIEW COMPARISON:  None. FINDINGS: Widening of the ankle mortise laterally. Narrowing the ankle mortise medially. No acute fracture. Chronic fracture distal fibula. Degenerative change in the talonavicular joint. Ossicle posterior to the ankle joint. IMPRESSION: Chronic ankle injury with ankle instability.  No fracture. Electronically Signed   By: Franchot Gallo M.D.   On: 03/01/2020 15:18   DG Swallowing Func-Speech Pathology  Result Date: 02/16/2020 Objective Swallowing Evaluation: Type of Study: MBS-Modified Barium Swallow Study  Patient Details Name: AVREY HYSER MRN: [id number] Date of Birth: 20-Feb-1956 Today's Date: 02/16/2020 SLP Time Calculation (min) (ACUTE ONLY): 25 min Past Medical History: Past Medical History: Diagnosis Date  Hypertension   NICM (nonischemic cardiomyopathy) ([place]) 06/15/2018  04/23/18: EF 45%, 09/15/18: NOrmal LVEF  Paroxysmal atrial fibrillation ([place]) 06/15/2018  Visit for monitoring Tikosyn therapy 06/12/2018 Past Surgical History: Past Surgical History: Procedure Laterality Date  CARDIOVERSION N/A 05/13/2018  Procedure: CARDIOVERSION;  Surgeon: [name], MD;  Location: [place];  Service: Cardiovascular;  Laterality: N/A;  CARDIOVERSION N/A 06/13/2018  Procedure: CARDIOVERSION;   Surgeon: [name], MD;  Location: [facility] ENDOSCOPY;  Service: Cardiovascular;  Laterality: N/A;  CARDIOVERSION N/A 06/23/2018  Procedure: CARDIOVERSION;  Surgeon: [name], MD;  Location: [place];  Service: Cardiovascular;  Laterality: N/A;  excision of actinic keratosis    EYE SURGERY    eye lift  IR CT HEAD LTD  01/27/2020  IR PERCUTANEOUS ART THROMBECTOMY/INFUSION INTRACRANIAL INC DIAG ANGIO  01/27/2020     IR PERCUTANEOUS ART THROMBECTOMY/INFUSION INTRACRANIAL INC DIAG ANGIO  01/27/2020  RADIOLOGY WITH ANESTHESIA N/A 01/27/2020  Procedure: IR WITH ANESTHESIA;  Surgeon: Radiologist, Medication, MD;  Location: [place];  Service: Radiology;  Laterality: N/A;  TONSILLECTOMY    WISDOM TOOTH EXTRACTION   HPI: Pt is a 64 yo male presenting iwth acute onset R sided weakness and aphasia. CT showed hyerdensity in the L M2 branch. Pt underwent emergent L MCA and ACA revascularization on 6/16. He was intubated 6/16-6/18. PMH includes: afib, cardiomyopathy, HTN. Pt admitted to [facility] 02/02/20.  Assessment / Plan / Recommendation CHL IP CLINICAL IMPRESSIONS 02/16/2020 Clinical Impression Mild-mod dysphagia still evidenced, however pt with improved oropharyngeal swallow function since last MBSS performed 02/02/20. Although trace lingual/palatal residue resulted in reduced bolus cohesion of thin barium trials, residue was sensed and pt automatically triggered additional swallow to  clear residue without airway intrusion. Trace lateral channel residue present, but when pt took small single cup sips of thin barium, no penetration of aspiration was observed, and swallow initiation was timely. Use of straw to transit thin barium resulted in a less timely swallow, with initiation occurring at the level of the pyriform sinuses, ultimately leading to flash penetration and later frank aspiration of thin with a delayed cough response. Aspirate did appear to be cleared from vestibule in subsequent imaging, however not captured during cough  response  to definitively determine if it was ejected or simply traveled further down trachea. Pt consumed barium pill with thin via cup with adequate bolus cohesion and no airway intrusion, however would recommend conservative approach to medication administration due to potential for difficulty with dual consistencies given oral phase deficits than can lead to decreased bolus cohesion, increasing aspiration risk. Recommend pt continue dysphagia 3 solids, upgrade to thin liquids - single cup sips only, NO STRAWS, medication may be given whole in puree. ST will continue to follow to ensure diet safety, efficiency, and carryover of swallow strategies. SLP Visit Diagnosis Dysphagia, oropharyngeal phase (R13.12) Attention and concentration deficit following -- Frontal lobe and executive function deficit following -- Impact on safety and function Mild aspiration risk   CHL IP TREATMENT RECOMMENDATION 02/02/2020 Treatment Recommendations Therapy as outlined in treatment plan below   Prognosis 01/29/2020 Prognosis for Safe Diet Advancement Good Barriers to Reach Goals Language deficits Barriers/Prognosis Comment -- CHL IP DIET RECOMMENDATION 02/16/2020 SLP Diet Recommendations Dysphagia 3 (Mech soft) solids;Thin liquid Liquid Administration via Cup;No straw Medication Administration Whole meds with puree Compensations Slow rate;Small sips/bites Postural Changes Remain semi-upright after after feeds/meals (Comment);Seated upright at 90 degrees   CHL IP OTHER RECOMMENDATIONS 02/16/2020 Recommended Consults -- Oral Care Recommendations Oral care BID Other Recommendations --   CHL IP FOLLOW UP RECOMMENDATIONS 02/02/2020 Follow up Recommendations Inpatient Rehab   CHL IP FREQUENCY AND DURATION 02/02/2020 Speech Therapy Frequency (ACUTE ONLY) min 2x/week Treatment Duration 2 weeks      CHL IP ORAL PHASE 02/16/2020 Oral Phase Impaired Oral - Pudding Teaspoon -- Oral - Pudding Cup -- Oral - Honey Teaspoon -- Oral - Honey Cup -- Oral -  Nectar Teaspoon -- Oral - Nectar Cup NT Oral - Nectar Straw NT Oral - Thin Teaspoon -- Oral - Thin Cup Decreased bolus cohesion;Lingual/palatal residue Oral - Thin Straw Decreased bolus cohesion;Premature spillage Oral - Puree NT Oral - Mech Soft NT Oral - Regular -- Oral - Multi-Consistency -- Oral - Pill WFL Oral Phase - Comment --  CHL IP PHARYNGEAL PHASE 02/16/2020 Pharyngeal Phase Impaired Pharyngeal- Pudding Teaspoon -- Pharyngeal -- Pharyngeal- Pudding Cup -- Pharyngeal -- Pharyngeal- Honey Teaspoon -- Pharyngeal -- Pharyngeal- Honey Cup -- Pharyngeal -- Pharyngeal- Nectar Teaspoon -- Pharyngeal -- Pharyngeal- Nectar Cup NT Pharyngeal -- Pharyngeal- Nectar Straw NT Pharyngeal -- Pharyngeal- Thin Teaspoon -- Pharyngeal -- Pharyngeal- Thin Cup Lateral channel residue Pharyngeal Material does not enter airway Pharyngeal- Thin Straw Delayed swallow initiation-pyriform sinuses;Reduced airway/laryngeal closure;Penetration/Aspiration before swallow;Inter-arytenoid space residue;Lateral channel residue Pharyngeal Material enters airway, passes BELOW cords and not ejected out despite cough attempt by patient Pharyngeal- Puree NT Pharyngeal -- Pharyngeal- Mechanical Soft NT Pharyngeal -- Pharyngeal- Regular -- Pharyngeal -- Pharyngeal- Multi-consistency -- Pharyngeal -- Pharyngeal- Pill WFL Pharyngeal -- Pharyngeal Comment --  CHL IP CERVICAL ESOPHAGEAL PHASE 02/16/2020 Cervical Esophageal Phase WFL Pudding Teaspoon -- Pudding Cup -- Honey Teaspoon -- Honey Cup -- Nectar Teaspoon -- Nectar Cup -- Nectar Straw -- Thin Teaspoon -- Thin Cup -- Thin Straw -- Puree -- Mechanical Soft -- Regular -- Multi-consistency -- Pill -- Cervical Esophageal Comment -- Arbutus Leas 02/16/2020, 9:57 AM               Labs:  Basic Metabolic Panel: Recent Labs  Lab 02/26/20 0626 02/29/20 0713  NA 142 142  K 3.9 4.1  CL 107 106  CO2 26 27  GLUCOSE 108* 115*  BUN 18 17  CREATININE 1.01 0.91  CALCIUM 8.9 8.9  CBC: CBC Latest  Ref Rng & Units 02/29/2020 02/15/2020 02/09/2020  WBC 4.0 - 10.5 K/uL 6.5 6.5 10.5  Hemoglobin 13.0 - 17.0 g/dL 13.7 13.7 13.4  Hematocrit 39 - 52 % 42.4 42.8 41.2  Platelets 150 - 400 K/uL 184 366 343    CBG: No results for input(s): GLUCAP in the last 168 hours.  Brief HPI:   DONAT HUMBLE is a 64 y.o. male with history of PAF/on Xarelto, and ICM; who was admitted on 01/27/2020 after collapsing at [facility] hardware. He was found to have right-sided weakness with left gaze preference as well as nausea and vomiting. CT head at admission revealed hyperdense sign left M2 branch compatible with acute thrombus and he underwent cerebral angio with complete recanalization of left-MCA and ACA. 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 He was maintained on aspirin given IR petechial hemorrhage and transition to Eliquis by 06/22. He was kept NPO till discharge due to  signs of dysphagia with risk of aspiration and coretak placed for nutritional support. He continued to have apraxia with limited verbal output, required multimodal cues to follow simple commands and had right hemiparesis with right lean affecting mobility and ADLs. CIR was recommended due to functional decline.   Hospital Course: DEVYON KEATOR was admitted to rehab 02/02/2020 for inpatient therapies to consist of PT, ST and OT at least three hours five days a week. Past admission physiatrist, therapy team and rehab RN have worked together to provide customized collaborative inpatient rehab. MBS was done prior to admission and patient was started on dysphagia 2 diet with nectar liquids. He was maintained on tube feeds until intake improved and had been advanced to regular diet, thin liquids with compensatory strategies by discharge. Impaired fasting glucose due to tube feeds were monitored and blood sugars are improved  off supplement. His blood pressures were monitored on TID basis and hypotension noted with increase in activity. Avapro was discontinued and blood pressures have improved.  Heart rate has been controlled on low-dose beta-blocker and patient asymptomatic and would need telemetry prior to resumption. Dr.Ganji felt that it was not imperative to resume Tikosyn at this time and he will follow up with patient after discharge.   Follow-up labs showed mild AKI which has resolved. He continues on Eliquis and follow-up CBC shows H&H to be stable. Leukocytosis has resolved and no signs of infection noted.  He was found to have urinary retention requiring in and out catheterizations. Flomax and Urecholine added to help with voiding function. Is voiding function improved Urecholine was tapered to 3 times daily per patient request prior to discharge. He continues to be limited by significant right hemiparesis and spasticity with increased tone noted in right knee flexors.  Low-dose tizanidine was added and titrated with with improvement in symptoms. He is tolerating this without side effects. Edema RUE and RLE treated with elevation as well as compression stocking. Family expressed concerns of right ankle pain as well as reported that patient had injury in the past. X-rays done revealing chronic distal fibula fracture with ankle instability and family advised to use ASA for support/symptom management. Patient has made gains during his rehab stay and currently requires min assist at wheelchair level. He will continue to receive outpatient PT, OT and ST at [facility] after discharge.    Rehab course: During patient's stay in rehab weekly team conferences were held  to monitor patient's progress, set goals and discuss barriers to discharge. At admission, patient required total assist with mobility and ADL tasks. He was tolerating modified diet with min cues for compensatory strategies. He presented with severe  expressive aphasia with oral apraxia, very low vocal intensity and noted to have a mild disorientation. He  has had improvement in activity tolerance, balance, postural control as well as ability to compensate for deficits. He has had improvement in functional use RUE  and RLE as well as improvement in awareness . He is able to complete ADL tasks with min assist. He requires min assist for sit to supine due to need for assistance with for RLE management. He requires min assist with verbal cues for sequencing and techniques for sit to stand transfers. He requires mod assist +2 with total assist to advance RLE/weight shift and for knee control to ambulate 70 feet with rolling walker and use of handrail. He requires min assist with communicating his basic wants and needs with communication board as well as use of gestures. He requires moderate cues for verbal expression at word and phrase level due to expressive deficits complicated by apraxia. Automatic expression has improved in addition to sentence completion and repetition ability with written sentences. Family education was completed regarding all aspects of mobility, care, cognition and safety.     Discharge disposition: 01-Home or Self Care  Diet: Heart healthy.    Special Instructions: 1. No straws. Small bites/small sips with meals.  2. No walking at this time--limited to therapy.  3. Wear support stocking on RLE for edema control.    Discharge Instructions     Ambulatory referral to Neurology   Complete by: As directed    An appointment is requested in approximately: 2 weeks   Ambulatory referral to Physical Medicine Rehab   Complete by: As directed    1-2 weeks TC appointment      Allergies as of 03/02/2020   No Known Allergies      Medication List     STOP taking these medications    bisacodyl 10 MG suppository Commonly known as: DULCOLAX   chlorhexidine gluconate (MEDLINE KIT) 0.12 % solution Commonly known as:  PERIDEX   Chlorhexidine Gluconate Cloth 2 % Pads   feeding supplement (OSMOLITE 1.5 CAL) Liqd   free water Soln   irbesartan 150 MG tablet Commonly known as: AVAPRO   metoprolol tartrate 25 mg/10 mL Susp Commonly known as: LOPRESSOR Replaced by: metoprolol tartrate 25 MG tablet   mouth rinse Liqd solution   Resource ThickenUp Clear Powd       TAKE these medications    acetaminophen 325 MG tablet Commonly known as: TYLENOL Take 1-2 tablets (325-650 mg total) by mouth every 4 (four) hours as needed for mild pain.   apixaban 5 MG Tabs tablet Commonly known as: ELIQUIS Take 1 tablet (5 mg total) by mouth 2 (two) times daily.   bethanechol 25 MG tablet Commonly known as: URECHOLINE Take 1 tablet (25 mg total) by mouth 3 (three) times daily.   feeding supplement (PRO-STAT SUGAR FREE 64) Liqd Take 30 mLs by mouth 2 (two) times daily. What changed: how to take this   metoprolol tartrate 25 MG tablet Commonly known as: LOPRESSOR Take 1 tablet (25 mg total) by mouth 2 (two) times daily. Replaces: metoprolol tartrate 25 mg/10 mL Susp   rosuvastatin 10 MG tablet Commonly known as: CRESTOR Take 1 tablet (10 mg total) by mouth daily after supper. What changed:  how to take this when to take this   senna-docusate 8.6-50 MG tablet Commonly known as: Senokot-S Take 1 tablet by mouth at bedtime as needed for mild constipation.   tamsulosin 0.4 MG Caps capsule Commonly known as: FLOMAX Take 1 capsule (0.4 mg total) by mouth daily at 8 pm.   tiZANidine 2 MG tablet Commonly known as: ZANAFLEX Take 1 tablet (2 mg total) by mouth 3 (three) times daily.        Follow-up Information     Jamse Arn, MD Follow up.   Specialty: Physical Medicine and Rehabilitation Contact information: [place] [address] [phone]         [facility] Follow up.   Why: office will call you with follow up appointment Contact  information: [place] [address] [phone]        [name], MD. Call on 03/03/2020.   Specialty: Cardiology Why: for follow up appointment Contact information: [place] [address] [place], [place], NP. Call on 03/29/2020.   Specialty: Internal Medicine Why: At 8:50 am Contact information: [place] [address] [phone]         Thompson Grayer, MD .   Specialty: Cardiology Contact information: [place] [address] [phone]                 Signed: Bary Leriche 03/03/2020, 6:11 PM Patient was seen, face-face, and physical exam performed by me on day of discharge, greater than 30 minutes of total time spent.. Please see progress note from day of discharge as well.  Delice Lesch, MD, ABPMR

## 2020-03-02 NOTE — Progress Notes (Addendum)
Inpatient Rehabilitation Care Coordinator  Discharge Note  The overall goal for the admission was met for:   Discharge location: Yes, Home  Length of Stay: Yes, 29 Days  Discharge activity level: Yes  Home/community participation: Yes  Services provided included: MD, RD, PT, OT, SLP, RN, CM, TR, Pharmacy, Neuropsych and SW  Financial Services: Private Insurance: [employer] [place]  Follow-up services arranged: Outpatient: [facility]  Comments (or additional information): PT OT [facility]  Patient/Family verbalized understanding of follow-up arrangements: Yes  Individual responsible for coordination of the follow-up plan: [name] McWilliams - [phone]  Confirmed correct DME delivered: Christina J Baskerville 03/02/2020    Christina J Baskerville 

## 2020-03-02 NOTE — Progress Notes (Signed)
[facility] PHYSICAL MEDICINE & REHABILITATION PROGRESS NOTE  Subjective/Complaints: Patient seen sitting up in his bed this morning.  He indicates he slept well overnight.  He indicates that he tolerated decreasing Urecholine yesterday.  His sister requested ankle xray yesterday due to previous injury. He indicates he is ready for discharge.   ROS: Denies CP, SOB, N/V/D  Objective: Vital Signs: Blood pressure 110/83, pulse 87, temperature 98.5 F (36.9 C), temperature source Oral, resp. rate 16, height 5\' 9"  (1.753 m), weight 77 kg, SpO2 96 %. DG Ankle Complete Right  Result Date: 03/01/2020 CLINICAL DATA:  Ankle swelling. EXAM: RIGHT ANKLE - COMPLETE 3+ VIEW COMPARISON:  None. FINDINGS: Widening of the ankle mortise laterally. Narrowing the ankle mortise medially. No acute fracture. Chronic fracture distal fibula. Degenerative change in the talonavicular joint. Ossicle posterior to the ankle joint. IMPRESSION: Chronic ankle injury with ankle instability.  No fracture. Electronically Signed   By: Franchot Gallo M.D.   On: 03/01/2020 15:18   Recent Labs    02/29/20 0713  WBC 6.5  HGB 13.7  HCT 42.4  PLT 184   Recent Labs    02/29/20 0713  NA 142  K 4.1  CL 106  CO2 27  GLUCOSE 115*  BUN 17  CREATININE 0.91  CALCIUM 8.9    Physical Exam: BP 110/83 (BP Location: Right Arm)   Pulse 87   Temp 98.5 F (36.9 C) (Oral)   Resp 16   Ht 5\' 9"  (1.753 m)   Wt 77 kg   SpO2 96%   BMI 25.07 kg/m  Constitutional: No distress . Vital signs reviewed. HENT: Normocephalic.  Atraumatic. Eyes: EOMI. No discharge. Cardiovascular: No JVD. Respiratory: Normal effort.  No stridor. GI: Non-distended. Skin: Warm and dry.  Intact. Psych: Normal mood.  Normal behavior. Musc: Mild left-sided edema.  No tenderness in extremities. Neuro: Alert Expressive, very slowly improving Right facial weakness, unchanged. Motor: RUE: Shoulder abduction 1+/5, distally 0/5  RLE: Hip flexion, knee  extension 2+-3-/5, distally 0/5, stable No increase in tone  Assessment/Plan: 1. Functional deficits secondary to left MCA infarct which require 3+ hours per day of interdisciplinary therapy in a comprehensive inpatient rehab setting.  Physiatrist is providing close team supervision and 24 hour management of active medical problems listed below.  Physiatrist and rehab team continue to assess barriers to discharge/monitor patient progress toward functional and medical goals  Care Tool:  Bathing    Body parts bathed by patient: Right arm, Chest, Abdomen, Front perineal area, Left upper leg, Face, Right upper leg, Right lower leg, Left lower leg   Body parts bathed by helper: Left arm     Bathing assist Assist Level: Minimal Assistance - Patient > 75%     Upper Body Dressing/Undressing Upper body dressing   What is the patient wearing?: Pull over shirt    Upper body assist Assist Level: Minimal Assistance - Patient > 75%    Lower Body Dressing/Undressing Lower body dressing      What is the patient wearing?: Pants, Incontinence brief     Lower body assist Assist for lower body dressing: Minimal Assistance - Patient > 75%     Toileting Toileting    Toileting assist Assist for toileting: Minimal Assistance - Patient > 75% Assistive Device Comment: urinal   Transfers Chair/bed transfer  Transfers assist  Chair/bed transfer activity did not occur: Safety/medical concerns  Chair/bed transfer assist level: Minimal Assistance - Patient > 75%     Locomotion Ambulation  Ambulation assist      Assist level: 2 helpers Assistive device: Walker-rolling Max distance: 92ft   Walk 10 feet activity   Assist  Walk 10 feet activity did not occur: Safety/medical concerns  Assist level: 2 helpers Assistive device: Walker-rolling   Walk 50 feet activity   Assist Walk 50 feet with 2 turns activity did not occur:  (didn't perform turns)  Assist level: 2  helpers Assistive device: Walker-rolling    Walk 150 feet activity   Assist Walk 150 feet activity did not occur: Safety/medical concerns (R hemi, fatigue, LE weakness, decreased postural control)         Walk 10 feet on uneven surface  activity   Assist Walk 10 feet on uneven surfaces activity did not occur: Safety/medical concerns (R hemi, fatigue, LE weakness, decreased postural control)         Wheelchair     Assist Will patient use wheelchair at discharge?: Yes Type of Wheelchair: Manual    Wheelchair assist level: Supervision/Verbal cueing Max wheelchair distance: 142ft    Wheelchair 50 feet with 2 turns activity    Assist        Assist Level: Supervision/Verbal cueing   Wheelchair 150 feet activity     Assist     Assist Level: Supervision/Verbal cueing      Medical Problem List and Plan: 1. Right hemiplegia, impaired mobility and ADLs secondary to left MCA ischemic stroke  DC today  Will see patient for transitional care management in 1-2 weeks post-discharge  WHO/PRAFO nightly 2.  Antithrombotics: -DVT/anticoagulation:  Pharmaceutical: Other (comment)--Eliquis             -antiplatelet therapy: N/A 3. Pain Management: Tylenol prn.  4. Mood: LCSW to follow for evaluation and support.              -antipsychotic agents: N/A 5. Neuropsych: This patient is not capable of making decisions on his own behalf.  Trial of amantadine started on 6/26, discussed with therapies, DC'd on 7/1 6. Skin/Wound Care: N/A 7. Fluids/Electrolytes/Nutrition: Monitor I/Os. 8. PAF: Monitor HR tid--has been off Tikosyn since admission. Continue metoprolol bid with Eliquis.   Per cardiology, continue to monitor-no need for Tikosyn at present   Vitals:   03/01/20 1932 03/02/20 0405  BP: 117/83 110/83  Pulse: 77 87  Resp: 17 16  Temp: 98.7 F (37.1 C) 98.5 F (36.9 C)  SpO2: 98% 96%   Rate controlled on 7/21 9. HTN: Monitor BP--managed on metoprolol    Avapro decreased to 75 on 7/1, decreased to 37.5 on 7/6, DC'd on 7/15  Relatively controlled on 7/21 10. Dyslipidemia: Continue Crestor. 11.  Post stroke dysphagia:   Advanced to regular thins with accommodations   NG DC'd  Advance diet as tolerated  12. Impaired fasting glucose: Resolved  Within normal limits on 7/9  Continue to monitor 13. Nonischemic cardiomyopathy: outpatient cardiology follow-up. 14.  Hypoalbuminemia  Supplement initiated on 6/23 15.  Leukocytosis: Resolved  Afebrile  Continue to monitor 16.  AKI: Improving  Creatinine 0.91 on 7/19  Encourage fluids  Continue to monitor 17. Constipation  Improving 18.  Urinary retention   Flomax started on 6/30  Urecholine started, increased on 7/7, increased again on 7/10, trial decreasing on 7/20  Voiding with minimal residual  Improving 19.  Spasticity , increased tone noted per PT, knee flexors on Right  Low dose tizanidine started  Improved 20.  Chronic right ankle injury  X-ray showing ankle injury with instability  >  30 minutes spent in total in discharge planning between myself and PA regarding aforementioned, as well discussion regarding DME equipment, follow-up appointments, follow-up therapies, discharge medications, discharge recommendations, chronic ankle injury/x-ray  LOS: 29 days A FACE TO FACE EVALUATION WAS PERFORMED  Dustin Schneider [name] 03/02/2020, 9:04 AM

## 2020-03-03 NOTE — Progress Notes (Signed)
[facility] Discharge Summary Patient Details  Name: Dustin Schneider MRN: [id number] Date of Birth: 07-16-1956 Today's Date: 03/03/2020  Long term goals set: 1  Long term goals met: 1  Comments on progress toward goals: Pt has made great progress during LOS and discharged home with family to provide 24 hour supervision/assitance.  TR session focused on activity analysis identifying modifications, dynamic sitting & standing balance and community mobility.  Pt is discharging at supervision/set up assist level for simple TR tasks.  Goal met.   Reasons for discharge: discharge from hospital  Patient/family agrees with progress made and goals achieved: Yes  [name] 03/03/2020, 8:20 AM

## 2020-03-04 ENCOUNTER — Telehealth: Payer: Self-pay | Admitting: *Deleted

## 2020-03-04 NOTE — Telephone Encounter (Signed)
Attempted to reach Mr Dustin Schneider for transitional care call. No answer.  Left VM with request for call back, and alerting them that a packet should arrive in the mail with date and time and address of appointment.

## 2020-03-06 ENCOUNTER — Emergency Department (HOSPITAL_COMMUNITY): Payer: BC Managed Care – PPO

## 2020-03-06 ENCOUNTER — Encounter (HOSPITAL_COMMUNITY): Payer: Self-pay | Admitting: Emergency Medicine

## 2020-03-06 ENCOUNTER — Inpatient Hospital Stay (HOSPITAL_COMMUNITY)
Admission: EM | Admit: 2020-03-06 | Discharge: 2020-03-11 | DRG: 194 | Disposition: A | Discharge status: Home Health | Payer: BC Managed Care – PPO | Attending: Internal Medicine | Admitting: Internal Medicine

## 2020-03-06 ENCOUNTER — Other Ambulatory Visit: Payer: Self-pay

## 2020-03-06 DIAGNOSIS — Y95 Nosocomial condition: Secondary | ICD-10-CM | POA: Diagnosis present

## 2020-03-06 DIAGNOSIS — E7800 Pure hypercholesterolemia, unspecified: Secondary | ICD-10-CM | POA: Diagnosis present

## 2020-03-06 DIAGNOSIS — I6930 Unspecified sequelae of cerebral infarction: Secondary | ICD-10-CM | POA: Diagnosis not present

## 2020-03-06 DIAGNOSIS — I69320 Aphasia following cerebral infarction: Secondary | ICD-10-CM | POA: Diagnosis not present

## 2020-03-06 DIAGNOSIS — E785 Hyperlipidemia, unspecified: Secondary | ICD-10-CM | POA: Diagnosis present

## 2020-03-06 DIAGNOSIS — I110 Hypertensive heart disease with heart failure: Secondary | ICD-10-CM | POA: Diagnosis present

## 2020-03-06 DIAGNOSIS — E872 Acidosis: Secondary | ICD-10-CM | POA: Diagnosis present

## 2020-03-06 DIAGNOSIS — J189 Pneumonia, unspecified organism: Principal | ICD-10-CM

## 2020-03-06 DIAGNOSIS — I959 Hypotension, unspecified: Secondary | ICD-10-CM | POA: Diagnosis present

## 2020-03-06 DIAGNOSIS — I69351 Hemiplegia and hemiparesis following cerebral infarction affecting right dominant side: Secondary | ICD-10-CM

## 2020-03-06 DIAGNOSIS — N400 Enlarged prostate without lower urinary tract symptoms: Secondary | ICD-10-CM | POA: Diagnosis present

## 2020-03-06 DIAGNOSIS — I480 Paroxysmal atrial fibrillation: Secondary | ICD-10-CM | POA: Diagnosis not present

## 2020-03-06 DIAGNOSIS — I69391 Dysphagia following cerebral infarction: Secondary | ICD-10-CM

## 2020-03-06 DIAGNOSIS — Z20822 Contact with and (suspected) exposure to covid-19: Secondary | ICD-10-CM | POA: Diagnosis present

## 2020-03-06 DIAGNOSIS — Z79899 Other long term (current) drug therapy: Secondary | ICD-10-CM

## 2020-03-06 DIAGNOSIS — R5381 Other malaise: Secondary | ICD-10-CM | POA: Diagnosis present

## 2020-03-06 DIAGNOSIS — Z7901 Long term (current) use of anticoagulants: Secondary | ICD-10-CM | POA: Diagnosis not present

## 2020-03-06 DIAGNOSIS — I5032 Chronic diastolic (congestive) heart failure: Secondary | ICD-10-CM | POA: Diagnosis present

## 2020-03-06 DIAGNOSIS — I428 Other cardiomyopathies: Secondary | ICD-10-CM | POA: Diagnosis present

## 2020-03-06 DIAGNOSIS — I4819 Other persistent atrial fibrillation: Secondary | ICD-10-CM | POA: Diagnosis present

## 2020-03-06 DIAGNOSIS — E119 Type 2 diabetes mellitus without complications: Secondary | ICD-10-CM | POA: Diagnosis present

## 2020-03-06 DIAGNOSIS — A419 Sepsis, unspecified organism: Secondary | ICD-10-CM | POA: Diagnosis present

## 2020-03-06 DIAGNOSIS — R1310 Dysphagia, unspecified: Secondary | ICD-10-CM | POA: Diagnosis present

## 2020-03-06 HISTORY — DX: Cerebral infarction, unspecified: I63.9

## 2020-03-06 LAB — URINALYSIS, ROUTINE W REFLEX MICROSCOPIC
Glucose, UA: NEGATIVE mg/dL
Ketones, ur: NEGATIVE mg/dL
Nitrite: POSITIVE — AB
Protein, ur: 100 mg/dL — AB
Specific Gravity, Urine: 1.032 — ABNORMAL HIGH (ref 1.005–1.030)
WBC, UA: >50 WBC/hpf — ABNORMAL HIGH (ref 0–5)
pH: 5 (ref 5.0–8.0)

## 2020-03-06 LAB — MAGNESIUM: Magnesium: 1.9 mg/dL (ref 1.7–2.4)

## 2020-03-06 LAB — CBC WITH DIFFERENTIAL/PLATELET
Abs Immature Granulocytes: 0.07 10*3/uL (ref 0.00–0.07)
Basophils Absolute: 0.1 10*3/uL (ref 0.0–0.1)
Basophils Relative: 1 %
Eosinophils Absolute: 0 10*3/uL (ref 0.0–0.5)
Eosinophils Relative: 0 %
HCT: 40.6 % (ref 39.0–52.0)
Hemoglobin: 13.2 g/dL (ref 13.0–17.0)
Immature Granulocytes: 1 %
Lymphocytes Relative: 6 %
Lymphs Abs: 0.8 10*3/uL (ref 0.7–4.0)
MCH: 31.7 pg (ref 26.0–34.0)
MCHC: 32.5 g/dL (ref 30.0–36.0)
MCV: 97.6 fL (ref 80.0–100.0)
Monocytes Absolute: 1 10*3/uL (ref 0.1–1.0)
Monocytes Relative: 8 %
Neutro Abs: 10.5 10*3/uL — ABNORMAL HIGH (ref 1.7–7.7)
Neutrophils Relative %: 84 %
Platelets: 201 10*3/uL (ref 150–400)
RBC: 4.16 MIL/uL — ABNORMAL LOW (ref 4.22–5.81)
RDW: 13.5 % (ref 11.5–15.5)
WBC: 12.4 10*3/uL — ABNORMAL HIGH (ref 4.0–10.5)
nRBC: 0 % (ref 0.0–0.2)

## 2020-03-06 LAB — COMPREHENSIVE METABOLIC PANEL
ALT: 31 U/L (ref 0–44)
AST: 22 U/L (ref 15–41)
Albumin: 2.9 g/dL — ABNORMAL LOW (ref 3.5–5.0)
Alkaline Phosphatase: 75 U/L (ref 38–126)
Anion gap: 13 (ref 5–15)
BUN: 10 mg/dL (ref 8–23)
CO2: 26 mmol/L (ref 22–32)
Calcium: 9 mg/dL (ref 8.9–10.3)
Chloride: 99 mmol/L (ref 98–111)
Creatinine, Ser: 1.04 mg/dL (ref 0.61–1.24)
GFR calc Af Amer: >60 mL/min (ref >60)
GFR calc non Af Amer: >60 mL/min (ref >60)
Glucose, Bld: 136 mg/dL — ABNORMAL HIGH (ref 70–99)
Potassium: 4.3 mmol/L (ref 3.5–5.1)
Sodium: 138 mmol/L (ref 135–145)
Total Bilirubin: 0.9 mg/dL (ref 0.3–1.2)
Total Protein: 6.4 g/dL — ABNORMAL LOW (ref 6.5–8.1)

## 2020-03-06 LAB — LACTIC ACID, PLASMA
Lactic Acid, Venous: 1.5 mmol/L (ref 0.5–1.9)
Lactic Acid, Venous: 2.6 mmol/L (ref 0.5–1.9)
Lactic Acid, Venous: 1.4 mmol/L (ref 0.5–1.9)

## 2020-03-06 LAB — PROCALCITONIN: Procalcitonin: 0.1 ng/mL

## 2020-03-06 LAB — HIV ANTIBODY (ROUTINE TESTING W REFLEX): HIV Screen 4th Generation wRfx: NONREACTIVE

## 2020-03-06 LAB — SARS CORONAVIRUS 2 BY RT PCR (HOSPITAL ORDER, PERFORMED IN ~~LOC~~ HOSPITAL LAB): SARS Coronavirus 2: NEGATIVE

## 2020-03-06 LAB — MRSA PCR SCREENING: MRSA by PCR: NEGATIVE

## 2020-03-06 LAB — STREP PNEUMONIAE URINARY ANTIGEN: Strep Pneumo Urinary Antigen: NEGATIVE

## 2020-03-06 MED ORDER — GUAIFENESIN ER 600 MG PO TB12
600.0000 mg | ORAL_TABLET | Freq: Two times a day (BID) | ORAL | Status: DC
  Administered 2020-03-06 – 2020-03-11 (×11): 600 mg via ORAL
  Filled 2020-03-06 (×11): qty 1

## 2020-03-06 MED ORDER — METOPROLOL TARTRATE 25 MG PO TABS
25.0000 mg | ORAL_TABLET | Freq: Two times a day (BID) | ORAL | Status: DC
  Administered 2020-03-06 – 2020-03-09 (×6): 25 mg via ORAL
  Filled 2020-03-06 (×6): qty 1

## 2020-03-06 MED ORDER — SODIUM CHLORIDE 0.9 % IV SOLN
2.0000 g | Freq: Once | INTRAVENOUS | Status: AC
  Administered 2020-03-06: 2 g via INTRAVENOUS
  Filled 2020-03-06: qty 2

## 2020-03-06 MED ORDER — SODIUM CHLORIDE 0.9 % IV BOLUS (SEPSIS)
1000.0000 mL | Freq: Once | INTRAVENOUS | Status: AC
  Administered 2020-03-06: 1000 mL via INTRAVENOUS

## 2020-03-06 MED ORDER — TAMSULOSIN HCL 0.4 MG PO CAPS
0.4000 mg | ORAL_CAPSULE | Freq: Every day | ORAL | Status: DC
  Administered 2020-03-06 – 2020-03-10 (×5): 0.4 mg via ORAL
  Filled 2020-03-06 (×5): qty 1

## 2020-03-06 MED ORDER — DILTIAZEM HCL-DEXTROSE 125-5 MG/125ML-% IV SOLN (PREMIX)
5.0000 mg/h | INTRAVENOUS | Status: DC
  Administered 2020-03-06: 5 mg/h via INTRAVENOUS
  Administered 2020-03-07: 12.5 mg/h via INTRAVENOUS
  Administered 2020-03-08: 7.5 mg/h via INTRAVENOUS
  Filled 2020-03-06 (×6): qty 125

## 2020-03-06 MED ORDER — SENNOSIDES-DOCUSATE SODIUM 8.6-50 MG PO TABS: 1.0000 | ORAL_TABLET | Freq: Every evening | ORAL | Status: DC | PRN

## 2020-03-06 MED ORDER — VANCOMYCIN HCL IN DEXTROSE 1-5 GM/200ML-% IV SOLN
1000.0000 mg | Freq: Two times a day (BID) | INTRAVENOUS | Status: DC
  Administered 2020-03-07 (×2): 1000 mg via INTRAVENOUS
  Filled 2020-03-06 (×2): qty 200

## 2020-03-06 MED ORDER — ACETAMINOPHEN 500 MG PO TABS
1000.0000 mg | ORAL_TABLET | Freq: Once | ORAL | Status: AC
  Administered 2020-03-06: 1000 mg via ORAL
  Filled 2020-03-06: qty 2

## 2020-03-06 MED ORDER — TIZANIDINE HCL 4 MG PO TABS: 2.0000 mg | ORAL_TABLET | Freq: Three times a day (TID) | ORAL | Status: DC | PRN

## 2020-03-06 MED ORDER — VANCOMYCIN HCL IN DEXTROSE 1-5 GM/200ML-% IV SOLN
1000.0000 mg | Freq: Once | INTRAVENOUS | Status: AC
  Administered 2020-03-06: 1000 mg via INTRAVENOUS
  Filled 2020-03-06: qty 200

## 2020-03-06 MED ORDER — SODIUM CHLORIDE 0.9% FLUSH: 3.0000 mL | Freq: Once | INTRAVENOUS | Status: DC

## 2020-03-06 MED ORDER — ROSUVASTATIN CALCIUM 5 MG PO TABS
10.0000 mg | ORAL_TABLET | Freq: Every day | ORAL | Status: DC
  Administered 2020-03-06 – 2020-03-10 (×5): 10 mg via ORAL
  Filled 2020-03-06 (×5): qty 2

## 2020-03-06 MED ORDER — VANCOMYCIN HCL 750 MG/150ML IV SOLN
750.0000 mg | Freq: Once | INTRAVENOUS | Status: AC
  Administered 2020-03-06: 750 mg via INTRAVENOUS
  Filled 2020-03-06: qty 150

## 2020-03-06 MED ORDER — APIXABAN 5 MG PO TABS
5.0000 mg | ORAL_TABLET | Freq: Two times a day (BID) | ORAL | Status: DC
  Administered 2020-03-06 – 2020-03-11 (×10): 5 mg via ORAL
  Filled 2020-03-06 (×11): qty 1

## 2020-03-06 MED ORDER — SODIUM CHLORIDE 0.9 % IV SOLN
2.0000 g | Freq: Three times a day (TID) | INTRAVENOUS | Status: DC
  Administered 2020-03-06 – 2020-03-10 (×11): 2 g via INTRAVENOUS
  Filled 2020-03-06 (×13): qty 2

## 2020-03-06 MED ORDER — ACETAMINOPHEN 325 MG PO TABS
650.0000 mg | ORAL_TABLET | Freq: Four times a day (QID) | ORAL | Status: DC | PRN
  Administered 2020-03-06 – 2020-03-08 (×6): 650 mg via ORAL
  Filled 2020-03-06 (×7): qty 2

## 2020-03-06 MED ORDER — BETHANECHOL CHLORIDE 25 MG PO TABS
25.0000 mg | ORAL_TABLET | Freq: Three times a day (TID) | ORAL | Status: DC
  Administered 2020-03-06 – 2020-03-11 (×15): 25 mg via ORAL
  Filled 2020-03-06 (×20): qty 1

## 2020-03-06 NOTE — ED Triage Notes (Addendum)
Family reports fever and generalized weakness x 2 days (Tmax 102).  Took Tylenol 650 mg at 4am.  Pt stayed in bed all day on Friday (which was unusual for him).  Pt with aphasia and R sided deficits from stroke in June.  Denies pain.

## 2020-03-06 NOTE — Progress Notes (Signed)
Notified provider and bedside nurse of need to draw repeat lactic acid.  2nd lactic was drawn which was higher than the previously drawn lactic, sent MD and bedside RN a message requesting for a 3rd lactic to be drawn.

## 2020-03-06 NOTE — ED Notes (Signed)
Dustin Schneider - RN aware of pt's vital signs.

## 2020-03-06 NOTE — Progress Notes (Signed)
Pharmacy Antibiotic Note  Dustin Schneider is a 64 y.o. male admitted on 03/06/2020 with pneumonia.  Pharmacy has been consulted for Cefepime and Vancomycin dosing.    Height: 5\' 9"  (175.3 cm) Weight: 77.1 kg (170 lb) IBW/kg (Calculated) : 70.7  Temp (24hrs), Avg:101.5 F (38.6 C), Min:100.7 F (38.2 C), Max:102.3 F (39.1 C)  Recent Labs  Lab 02/29/20 0713 03/06/20 0733 03/06/20 1034  WBC 6.5 12.4*  --   CREATININE 0.91 1.04  --   LATICACIDVEN  --  1.5 2.6*    Estimated Creatinine Clearance: 70.8 mL/min (by C-G formula based on SCr of 1.04 mg/dL).    No Known Allergies  Assessment: Patient worsening clinically over last 4 hours, LA at 2.6. Continuing broad coverage. Patient did not receive IV antibiotics at recent admit and concern for MDR is low.   Cefepime 7/25 >> Vancomycin 7/25 >>  Plan: Cefepime IV 2g Q8hr Vancomycin IV 1750mg  LD  Then Vancomycin 1g q12hr Add MRSA PCR  Monitor renal function daily.  Follow cultures and nasal swab to narrow coverage.  Thank you for allowing pharmacy to be a part of this patient's care.  Norina Buzzard, PharmD PGY1 Pharmacy Resident 03/06/2020 11:23 AM

## 2020-03-06 NOTE — H&P (Signed)
History and Physical    Dustin Schneider:[id number] DOB: [birth year] DOA: 03/06/2020  Referring MD/NP/PA: Anette Riedel, PA-C PCP: Kerin Perna, NP  Patient coming from: home   Chief Complaint: Fever  I have personally briefly reviewed patient's old medical records in [place]   HPI: Dustin Schneider is a 64 y.o. male with medical history significant of hypertension, hyperlipidemia, NICM, PAF on Eliquis, BPH, and recent CVA with residual right-sided hemiplegia and expressive aphasia presents with complaints of 2-day history of fever.  History is mostly obtained from the patient's brother-in-law who is his healthcare power of attorney.  Patient reportedly felt warm at home and family gave him Tylenol.  He stayed in the bed most of Friday, but was up on Saturday.  However, patient was noted to have fevers up to 101F last night around 8 PM.  They again gave Tylenol, but due to persistence of symptoms brought him to the hospital for further evaluation.  Patient denies having any recent cough, chest pain, shortness of breath, nausea, vomiting, diarrhea, or dysuria symptoms.  Denies coughing or choking when eating.  After getting discharged from rehab he was advised that he could eat without restrictions.  Patient chronically has leg swelling that is unchanged from baseline.  During his last hospitalization in June it appeared that he was taken off of Tikosyn at that time.  He was supposed to follow-up with Dr. [name] to determine if and when he should be restarted on this medicine.  ED Course: Upon admission in the emergency department patient was seen to be febrile up to 102.3 F, pulse 56-122, respirations 16-28, blood pressures as low as 83/46 with improvement with IV fluids.  Labs significant for WBC 12.4 and lactic acid 1.5->2.6.  Chest x-ray concerning for right midlung opacity.  Patient had been given 1000 mg Tylenol, 1 L bolus of normal saline IV fluids, vancomycin, and cefepime.   TRH was called to admit.   Review of Systems  Constitutional: Positive for fever and malaise/fatigue.  HENT: Negative for congestion and nosebleeds.   Eyes: Negative for photophobia and pain.  Respiratory: Negative for cough and shortness of breath.   Cardiovascular: Positive for leg swelling. Negative for chest pain and palpitations.  Gastrointestinal: Negative for abdominal pain, constipation, diarrhea, nausea and vomiting.  Genitourinary: Negative for dysuria and hematuria.  Neurological: Positive for speech change.  Psychiatric/Behavioral: Negative for substance abuse. The patient is not nervous/anxious.     Past Medical History:  Diagnosis Date  . Hypertension   . NICM (nonischemic cardiomyopathy) ([place]) 06/15/2018   04/23/18: EF 45%, 09/15/18: NOrmal LVEF  . Paroxysmal atrial fibrillation ([place]) 06/15/2018  . Stroke ([place])   . Visit for monitoring Tikosyn therapy 06/12/2018    Past Surgical History:  Procedure Laterality Date  . CARDIOVERSION N/A 05/13/2018   Procedure: CARDIOVERSION;  Surgeon: [name], MD;  Location: [facility] ENDOSCOPY;  Service: Cardiovascular;  Laterality: N/A;  . CARDIOVERSION N/A 06/13/2018   Procedure: CARDIOVERSION;  Surgeon: [name], MD;  Location: [facility] ENDOSCOPY;  Service: Cardiovascular;  Laterality: N/A;  . CARDIOVERSION N/A 06/23/2018   Procedure: CARDIOVERSION;  Surgeon: [name], MD;  Location: [place];  Service: Cardiovascular;  Laterality: N/A;  . excision of actinic keratosis    . EYE SURGERY     eye lift  . IR CT HEAD LTD  01/27/2020  . IR PERCUTANEOUS ART THROMBECTOMY/INFUSION INTRACRANIAL INC DIAG ANGIO  01/27/2020      . IR PERCUTANEOUS ART THROMBECTOMY/INFUSION  INTRACRANIAL INC DIAG ANGIO  01/27/2020  . RADIOLOGY WITH ANESTHESIA N/A 01/27/2020   Procedure: IR WITH ANESTHESIA;  Surgeon: Radiologist, Medication, MD;  Location: [place];  Service: Radiology;  Laterality: N/A;  . TONSILLECTOMY    . WISDOM TOOTH EXTRACTION       reports  that he has never smoked. He has never used smokeless tobacco. He reports current alcohol use. He reports that he does not use drugs.  No Known Allergies  Family History  Problem Relation Age of Onset  . Glaucoma Mother   . Atrial fibrillation Father     Prior to Admission medications   Medication Sig Start Date End Date Taking? Authorizing Provider  acetaminophen (TYLENOL) 325 MG tablet Take 1-2 tablets (325-650 mg total) by mouth every 4 (four) hours as needed for mild pain. 02/29/20  Yes Love, Ivan Anchors, PA-C  Amino Acids-Protein Hydrolys (FEEDING SUPPLEMENT, PRO-STAT SUGAR FREE 64,) LIQD Take 30 mLs by mouth 2 (two) times daily. 03/02/20  Yes Love, Ivan Anchors, PA-C  apixaban (ELIQUIS) 5 MG TABS tablet Take 1 tablet (5 mg total) by mouth 2 (two) times daily. 03/02/20 04/01/20 Yes Love, Ivan Anchors, PA-C  bethanechol (URECHOLINE) 25 MG tablet Take 1 tablet (25 mg total) by mouth 3 (three) times daily. 03/02/20  Yes Love, Ivan Anchors, PA-C  metoprolol tartrate (LOPRESSOR) 25 MG tablet Take 1 tablet (25 mg total) by mouth 2 (two) times daily. 03/02/20  Yes Love, Ivan Anchors, PA-C  rosuvastatin (CRESTOR) 10 MG tablet Take 1 tablet (10 mg total) by mouth daily after supper. 03/02/20  Yes Love, Ivan Anchors, PA-C  senna-docusate (SENOKOT-S) 8.6-50 MG tablet Take 1 tablet by mouth at bedtime as needed for mild constipation. 02/02/20  Yes Donzetta Starch, NP  tamsulosin (FLOMAX) 0.4 MG CAPS capsule Take 1 capsule (0.4 mg total) by mouth daily at 8 pm. 03/02/20  Yes Love, Ivan Anchors, PA-C  tiZANidine (ZANAFLEX) 2 MG tablet Take 1 tablet (2 mg total) by mouth 3 (three) times daily. 03/02/20  Yes LoveIvan Anchors, PA-C    Physical Exam:  Constitutional: NAD, calm, comfortable Vitals:   03/06/20 1030 03/06/20 1100 03/06/20 1130 03/06/20 1200  BP: (!) 83/46 91/67 98/69  103/75  Pulse: 95  56   Resp: 20 (!) 28 19 22   Temp:      TempSrc:      SpO2:      Weight:      Height:       Eyes: PERRL, lids and conjunctivae  normal ENMT: Mucous membranes are moist. Posterior pharynx clear of any exudate or lesions.   Neck: normal, supple, no masses, no thyromegaly Respiratory: Breath sounds normal left lung field decreased on the right lung field.  Patient currently maintaining O2 saturations on room air. Cardiovascular: Irregularly irregular, no murmurs / rubs / gallops.  1+ bilateral extremity edema. 2+ pedal pulses. No carotid bruits.  Abdomen: no tenderness, no masses palpated. No hepatosplenomegaly. Bowel sounds positive.  Musculoskeletal: no clubbing / cyanosis. No joint deformity upper and lower extremities. Good ROM, no contractures. Normal muscle tone.  Skin: no rashes, lesions, ulcers. No induration Neurologic: CN 2-12 grossly intact.  Right sided hemiplegia with expressive aphasia. Psychiatric: Normal judgment and insight. Alert and oriented x 3. Normal mood.     Labs on Admission: I have personally reviewed following labs and imaging studies  CBC: Recent Labs  Lab 02/29/20 0713 03/06/20 0733  WBC 6.5 12.4*  NEUTROABS  --  10.5*  HGB 13.7 13.2  HCT 42.4 40.6  MCV 96.1 97.6  PLT 184 622   Basic Metabolic Panel: Recent Labs  Lab 02/29/20 0713 03/06/20 0733  NA 142 138  K 4.1 4.3  CL 106 99  CO2 27 26  GLUCOSE 115* 136*  BUN 17 10  CREATININE 0.91 1.04  CALCIUM 8.9 9.0   GFR: Estimated Creatinine Clearance: 70.8 mL/min (by C-G formula based on SCr of 1.04 mg/dL). Liver Function Tests: Recent Labs  Lab 03/06/20 0733  AST 22  ALT 31  ALKPHOS 75  BILITOT 0.9  PROT 6.4*  ALBUMIN 2.9*   No results for input(s): LIPASE, AMYLASE in the last 168 hours. No results for input(s): AMMONIA in the last 168 hours. Coagulation Profile: No results for input(s): INR, PROTIME in the last 168 hours. Cardiac Enzymes: No results for input(s): CKTOTAL, CKMB, CKMBINDEX, TROPONINI in the last 168 hours. BNP (last 3 results) No results for input(s): PROBNP in the last 8760 hours. HbA1C: No  results for input(s): HGBA1C in the last 72 hours. CBG: No results for input(s): GLUCAP in the last 168 hours. Lipid Profile: No results for input(s): CHOL, HDL, LDLCALC, TRIG, CHOLHDL, LDLDIRECT in the last 72 hours. Thyroid Function Tests: No results for input(s): TSH, T4TOTAL, FREET4, T3FREE, THYROIDAB in the last 72 hours. Anemia Panel: No results for input(s): VITAMINB12, FOLATE, FERRITIN, TIBC, IRON, RETICCTPCT in the last 72 hours. Urine analysis: No results found for: COLORURINE, APPEARANCEUR, LABSPEC, PHURINE, GLUCOSEU, HGBUR, BILIRUBINUR, KETONESUR, PROTEINUR, UROBILINOGEN, NITRITE, LEUKOCYTESUR Sepsis Labs: Recent Results (from the past 240 hour(s))  SARS Coronavirus 2 by RT PCR (hospital order, performed in [facility] hospital lab) Nasopharyngeal Nasopharyngeal Swab     Status: None   Collection Time: 03/06/20  7:40 AM   Specimen: Nasopharyngeal Swab  Result Value Ref Range Status   SARS Coronavirus 2 NEGATIVE NEGATIVE Final    Comment: (NOTE) SARS-CoV-2 target nucleic acids are NOT DETECTED.  The SARS-CoV-2 RNA is generally detectable in upper and lower respiratory specimens during the acute phase of infection. The lowest concentration of SARS-CoV-2 viral copies this assay can detect is 250 copies / mL. A negative result does not preclude SARS-CoV-2 infection and should not be used as the sole basis for treatment or other patient management decisions.  A negative result may occur with improper specimen collection / handling, submission of specimen other than nasopharyngeal swab, presence of viral mutation(s) within the areas targeted by this assay, and inadequate number of viral copies (<250 copies / mL). A negative result must be combined with clinical observations, patient history, and epidemiological information.  Fact Sheet for Patients:   StrictlyIdeas.no  Fact Sheet for Healthcare  Providers: BankingDealers.co.za  This test is not yet approved or  cleared by the [place] FDA and has been authorized for detection and/or diagnosis of SARS-CoV-2 by FDA under an Emergency Use Authorization (EUA).  This EUA will remain in effect (meaning this test can be used) for the duration of the COVID-19 declaration under Section 564(b)(1) of the Act, 21 U.S.C. section 360bbb-3(b)(1), unless the authorization is terminated or revoked sooner.  Performed at [facility], [place] [address] [place], [place] [address]   Blood culture (routine x 2)     Status: None (Preliminary result)   Collection Time: 03/06/20 10:26 AM   Specimen: BLOOD  Result Value Ref Range Status   Specimen Description BLOOD RIGHT ANTECUBITAL  Final   Special Requests   Final    BOTTLES DRAWN AEROBIC AND ANAEROBIC Blood  Culture adequate volume Performed at [facility], [place] [address], [place], [place] [address]    Culture PENDING  Incomplete   Report Status PENDING  Incomplete  Blood culture (routine x 2)     Status: None (Preliminary result)   Collection Time: 03/06/20 10:26 AM   Specimen: BLOOD LEFT WRIST  Result Value Ref Range Status   Specimen Description BLOOD LEFT WRIST  Final   Special Requests   Final    BOTTLES DRAWN AEROBIC AND ANAEROBIC Blood Culture adequate volume Performed at [facility], [place] [address] [place], [place] [address]    Culture PENDING  Incomplete   Report Status PENDING  Incomplete     Radiological Exams on Admission: DG Chest 2 View  Result Date: 03/06/2020 CLINICAL DATA:  Fevers for 2 days. EXAM: CHEST - 2 VIEW COMPARISON:  01/30/2020 FINDINGS: Normal heart size. No pleural effusion or edema identified. Airspace consolidation within the right midlung is identified concerning for pneumonia. This is a new finding compared with 01/30/2020. Visualized osseous structures are unremarkable. IMPRESSION: Right midlung airspace  consolidation concerning for pneumonia. Followup PA and lateral chest X-ray is recommended in 3-4 weeks following trial of antibiotic therapy to ensure resolution and exclude underlying malignancy. Electronically Signed   By: Kerby Moors M.D.   On: 03/06/2020 08:15    EKG: Independently reviewed.  Atrial fibrillation with RVR at 109 bpm  Assessment/Plan Sepsis secondary to HCAP: Patient presents with complaints of fever up to 103.2 F with tachycardia, tachypnea chest x-ray significant for right midlung consolidation concerning for pneumonia.  COVID-19 screening was negative.  Patient had been empirically started on empiric antibiotics given recent hospitalization with rehab stay of vancomycin and cefepime and cultures were obtained.  Suspect pneumonia could be secondary to has right-sided hemiplegia with poor inspiratory effort on the right side versus possibly aspiration. -Admit to a telemetry bed -Follow-up blood, urine, and sputum cultures -Aspiration precautions elevate head of bed -Check MRSA screen -Add on procalcitonin -Incentive spirometry -Continue empiric antibiotics of vancomycin and cefepime and de-escalate when medically appropriate -Tylenol as needed for fever -Trend lactic acid level -Recheck CBC in a.m.  Transient hypotension: Resolved..  Blood pressures noted to be as low as 83/46, but responded appropriately to IV fluids.  Home blood pressure medications include metoprolol 25 mg 2 times daily.  Patient had taken his morning metoprolol dose. -Will give an additional liter of normal saline IV fluids  Paroxysmal atrial fibrillation: Patient presented in atrial fibrillation with heart rate elevated up to 130.  Patient.  He previously had been on Tikosyn, but this was discontinued at some point in time during his last hospitalization.  Family requesting if patient can possibly be restarted on Tikosyn during his hospitalization and already had an appointment see Dr. [name] on  3/30.. -Continue Eliquis -Continue metoprolol as tolerated -Discussed with Dr. Terri Skains of cardiology covering for Dr. [name] who recommended utilizing Cardizem drip if needed for rate control.  They will see at some point during his hospitalization and discuss possibly being started on Tikosyn.  Recent CVA with residual right-sided hemiplegia and expressive aphasia: Patient suffered a stroke in 01/2020 and status post clot removal of left MCA and ACA. -Continue statin and Eliquis -PT and speech to evaluate and treat  Diastolic dysfunction: At this time patient does not appear grossly fluid overloaded.  Last EF noted to be 55-60% with grade 2 diastolic dysfunction by echocardiogram from 01/28/2020. -Strict intake and output -Daily weights  DVT prophylaxis: Eliquis Code Status:Full Family Communication: Patient's brother-in-law updated at bedside Disposition Plan: Possible discharge home in 2 to 3 days. Consults called: None  Admission status: inpatient   Norval Morton MD [facility] Pager [phone]   If 7PM-7AM, please contact night-coverage [email or web address] Password [facility]  03/06/2020, 12:14 PM

## 2020-03-06 NOTE — Consult Note (Signed)
CARDIOLOGY CONSULT NOTE  Patient ID: Dustin Schneider MRN: [id number] DOB/AGE: 04-25-1956 64 y.o.  Admit date: 03/06/2020 Referring Physician: Norval Morton, MD Primary Physician:  Kerin Perna, NP  Primary Cardiologist: Dr. [name] Reason for Consultation: A. fib with rapid ventricular  HPI:  Dustin Schneider is a 64 y.o. male who presents with a chief complaint of " fever and weakness."  His past medical history and cardiovascular risk factors include: Paroxysmal atrial fibrillation with history of cardioversion, mild to moderate valvular heart disease, hypertension, hyperlipidemia, status post stroke with residual right-sided weakness and expressive aphasia.  At the time of evaluation patient is accompanied by his significant other Dustin Schneider who provides majority of the history of present illness after patient providing verbal consent to discuss his medical history and management in her presence.  Patient was recently admitted for CVA and then was transferred to rehab for further management.  Patient was recently discharged from rehab to home.  On the last 2 days patient been having symptoms of fever that did not respond well to Tylenol.  Due to persistent fevers and generalized weakness patient decided to come to the hospital for further evaluation.  Upon admission to the ER patient was noted to be febrile with a temperature 102.3 F, tachycardia with rates up to 122 bpm, tachypneic with respiratory rates between 16-28 breaths/min, and hypotensive.  Blood work noted leukocytosis and lactic acidosis.  Chest x-ray notes right middle lobe elevation concerning of pneumonia.  Patient has been started on broad-spectrum antibiotics.  Cardiology was consulted for management of atrial fibrillation with rapid ventricular rate.  Patient prior to his hospitalization for CVA was on Tikosyn.  Tikosyn was held during his last hospitalization and was asked to follow-up with his primary cardiologist Dr.  [name] once discharged from rehab to determine if he should restart antiarrhythmic medications.  At the time of evaluation patient states that she has been compliant with Eliquis for thromboembolic prophylaxis.  He currently takes metoprolol for rate control.  Patient's ventricular rate is elevated at the time of the evaluation but he is currently asymptomatic.  Patient is able to maintain appropriate blood pressure on telemetry.  ALLERGIES: No Known Allergies  PAST MEDICAL HISTORY: Past Medical History:  Diagnosis Date  . Hypertension   . NICM (nonischemic cardiomyopathy) ([place]) 06/15/2018   04/23/18: EF 45%, 09/15/18: NOrmal LVEF  . Paroxysmal atrial fibrillation ([place]) 06/15/2018  . Stroke ([place])   . Visit for monitoring Tikosyn therapy 06/12/2018    PAST SURGICAL HISTORY: Past Surgical History:  Procedure Laterality Date  . CARDIOVERSION N/A 05/13/2018   Procedure: CARDIOVERSION;  Surgeon: [name], MD;  Location: [facility] ENDOSCOPY;  Service: Cardiovascular;  Laterality: N/A;  . CARDIOVERSION N/A 06/13/2018   Procedure: CARDIOVERSION;  Surgeon: [name], MD;  Location[facility] ENDOSCOPY;  Service: Cardiovascular;  Laterality: N/A;  . CARDIOVERSION N/A 06/23/2018   Procedure: CARDIOVERSION;  Surgeon: [name], MD;  Location: [place];  Service: Cardiovascular;  Laterality: N/A;  . excision of actinic keratosis    . EYE SURGERY     eye lift  . IR CT HEAD LTD  01/27/2020  . IR PERCUTANEOUS ART THROMBECTOMY/INFUSION INTRACRANIAL INC DIAG ANGIO  01/27/2020      . IR PERCUTANEOUS ART THROMBECTOMY/INFUSION INTRACRANIAL INC DIAG ANGIO  01/27/2020  . RADIOLOGY WITH ANESTHESIA N/A 01/27/2020   Procedure: IR WITH ANESTHESIA;  Surgeon: Radiologist, Medication, MD;  Location: [place];  Service: Radiology;  Laterality: N/A;  . TONSILLECTOMY    .  WISDOM TOOTH EXTRACTION      FAMILY HISTORY: The patient family history includes Atrial fibrillation in his father; Glaucoma in his mother.   SOCIAL  HISTORY:  The patient  reports that he has never smoked. He has never used smokeless tobacco. He reports current alcohol use. He reports that he does not use drugs.  MEDICATIONS: (Not in a hospital admission)  Current Outpatient Medications  Medication Instructions  . acetaminophen (TYLENOL) 325-650 mg, Oral, Every 4 hours PRN  . Amino Acids-Protein Hydrolys (FEEDING SUPPLEMENT, PRO-STAT SUGAR FREE 64,) LIQD 30 mLs, Oral, 2 times daily  . apixaban (ELIQUIS) 5 mg, Oral, 2 times daily  . bethanechol (URECHOLINE) 25 mg, Oral, 3 times daily  . metoprolol tartrate (LOPRESSOR) 25 mg, Oral, 2 times daily  . rosuvastatin (CRESTOR) 10 mg, Oral, Daily after supper  . senna-docusate (SENOKOT-S) 8.6-50 MG tablet 1 tablet, Oral, At bedtime PRN  . tamsulosin (FLOMAX) 0.4 mg, Oral, Daily  . tiZANidine (ZANAFLEX) 2 mg, Oral, 3 times daily    Review of Systems  Constitutional: Positive for fever and malaise/fatigue. Negative for chills.  HENT: Negative for hoarse voice and nosebleeds.   Eyes: Negative for discharge, double vision and pain.  Cardiovascular: Positive for leg swelling. Negative for chest pain, claudication, dyspnea on exertion, near-syncope, orthopnea, palpitations, paroxysmal nocturnal dyspnea and syncope.  Respiratory: Negative for hemoptysis and shortness of breath.   Musculoskeletal: Negative for muscle cramps and myalgias.  Gastrointestinal: Negative for abdominal pain, constipation, diarrhea, hematemesis, hematochezia, melena, nausea and vomiting.  Neurological: Positive for focal weakness (right sided weakness.). Negative for dizziness and light-headedness.       Expressive aphasia   PHYSICAL EXAM: Vitals with BMI 03/06/2020 03/06/2020 03/06/2020  Height - - -  Weight - - -  BMI - - -  Systolic 601 093 235  Diastolic 83 98 80  Pulse 95 113 60     Intake/Output Summary (Last 24 hours) at 03/06/2020 1814 Last data filed at 03/06/2020 1705 Gross per 24 hour  Intake 1000 ml   Output --  Net 1000 ml    Net IO Since Admission: 1,000 mL [03/06/20 1814] CONSTITUTIONAL: Age-appropriate Caucasian gentleman, hemodynamically stable, no acute distress.   SKIN: Skin is warm to touch.  No rash noted. No cyanosis. No pallor. No jaundice HEAD: Normocephalic and atraumatic.  EYES: No scleral icterus MOUTH/THROAT: Moist oral membranes.  NECK: No JVD present. No thyromegaly noted. No carotid bruits  LYMPHATIC: No visible cervical adenopathy.  CHEST Normal respiratory effort. No intercostal retractions  LUNGS: Decreased breath sounds bilaterally, right worse than left.  No stridor. No wheezes. No rales.  CARDIOVASCULAR: Irregularly irregular, variable S1-S2, no murmurs rubs or gallops appreciated secondary to tachycardia. ABDOMINAL: Soft, nontender, nondistended, positive bowel sounds in all 4 quadrants, no apparent ascites.  EXTREMITIES: Bilateral pitting edema right worse than left, 2+ posterior tibial pulses bilaterally. HEMATOLOGIC: No significant bruising NEUROLOGIC: Oriented to person, place, and time.  Decreased strength in right upper and lower extremity, expressive aphasia. PSYCHIATRIC: Normal mood and affect. Normal behavior. Cooperative  RADIOLOGY: DG Chest 2 View  Result Date: 03/06/2020 CLINICAL DATA:  Fevers for 2 days. EXAM: CHEST - 2 VIEW COMPARISON:  01/30/2020 FINDINGS: Normal heart size. No pleural effusion or edema identified. Airspace consolidation within the right midlung is identified concerning for pneumonia. This is a new finding compared with 01/30/2020. Visualized osseous structures are unremarkable. IMPRESSION: Right midlung airspace consolidation concerning for pneumonia. Followup PA and lateral chest X-ray is recommended in 3-4  weeks following trial of antibiotic therapy to ensure resolution and exclude underlying malignancy. Electronically Signed   By: Kerby Moors M.D.   On: 03/06/2020 08:15    LABORATORY DATA: Lab Results  Component Value  Date   WBC 12.4 (H) 03/06/2020   HGB 13.2 03/06/2020   HCT 40.6 03/06/2020   MCV 97.6 03/06/2020   PLT 201 03/06/2020    Recent Labs  Lab 03/06/20 0733  NA 138  K 4.3  CL 99  CO2 26  BUN 10  CREATININE 1.04  CALCIUM 9.0  PROT 6.4*  BILITOT 0.9  ALKPHOS 75  ALT 31  AST 22  GLUCOSE 136*    Lipid Panel   Lab Results  Component Value Date   CHOL 125 01/28/2020   HDL 32 (L) 01/28/2020   LDLCALC 33 01/28/2020   TRIG 298 (H) 01/28/2020   CHOLHDL 3.9 01/28/2020   BNP (last 3 results) No results for input(s): BNP in the last 8760 hours.  HEMOGLOBIN A1C Lab Results  Component Value Date   HGBA1C 5.6 01/28/2020   MPG 114 01/28/2020   Cardiac Panel (last 3 results) No results for input(s): CKTOTAL, CKMB, TROPONINIHS, RELINDX in the last 72 hours.  TSH Recent Labs    12/17/19 0836  TSH 3.160     Scheduled Meds: . apixaban  5 mg Oral BID  . bethanechol  25 mg Oral TID  . guaiFENesin  600 mg Oral BID  . metoprolol tartrate  25 mg Oral BID  . rosuvastatin  10 mg Oral QPC supper  . tamsulosin  0.4 mg Oral Q2000   Continuous Infusions: . ceFEPime (MAXIPIME) IV    . [START ON 03/07/2020] vancomycin     PRN Meds:.acetaminophen, senna-docusate, tiZANidine  CARDIAC DATABASE: EKG: 03/06/2020: Atrial fibrillation, 109 bpm, normal axis, poor R wave progression, nonspecific T wave abnormality.  Echocardiogram: 09/15/2018:  Left ventricle cavity is normal in size. Mild concentric hypertrophy of the left ventricle. Normal global wall motion. Normal diastolic filling pattern. Calculated EF 69%.  Left atrial cavity is mildly dilated.  Moderate (Grade II) aortic regurgitation.  Mild prolapse of the mitral valve leaflets. Moderate (Grade III) mitral regurgitation.  Mild tricuspid regurgitation. Estimated pulmonary artery systolic pressure 24 mmHg.  No significant change compared to previous study 04/23/2018.  January 28, 2020: 1. Left ventricular ejection fraction, by  estimation, is 55 to 60%. The left ventricle has normal function. The left ventricle has no regional wall motion abnormalities. Left ventricular diastolic parameters are  consistent with Grade II diastolic dysfunction (pseudonormalization).  2. Right ventricular systolic function is normal. The right ventricular size is normal. There is mildly elevated pulmonary artery systolic pressure.  3. Left atrial size was moderately dilated.  4. Right atrial size was moderately dilated.  5. The mitral valve is normal in structure. Mild mitral valve regurgitation.  6. The aortic valve is normal in structure. Aortic valve regurgitation is mild to moderate.  7. The inferior vena cava is dilated in size with >50% respiratory variability, suggesting right atrial pressure of 8 mmHg.  Nuclear stress test 05/05/2018: 1. The resting electrocardiogram demonstrated atrial fibrillation. The stress electrocardiogram was positive for ischemia with 2 mm downsloping ST depression in the inferior and lateral leads, persisted for 2 mintues into recovery. Stress symptoms included palpitations and fatigue. Patient exercised on Bruce protocol for 6:00 minutes and achieved 7.05 METS. Stress test terminated due to fatigue and 113% MPHR achieved (Target HR >85%). 2. Left ventricular cavity is noted to  be normal on the rest and stress studies. SPECT images demonstrate homogeneous tracer distribution throughout the myocardium. The left ventricular ejection fraction was calculated to be 38%, but visually appears to be at least low normal without wall motion abnormality. This is an intermediate risk study due to abnormal ST change and low LVEF (difficult in view of gating with A. Fib), clinical correlation recommended.  IMPRESSION & RECOMMENDATIONS: Dustin Schneider is a 64 y.o. male whose past medical history and cardiovascular risk factors include: Paroxysmal atrial fibrillation with history of cardioversion, mild to moderate  valvular heart disease, hypertension, hyperlipidemia, status post stroke with residual right-sided weakness and expressive aphasia.  Atrial fibrillation with rapid ventricular rate  Patient is known to have history of proximal atrial fibrillation and has history of cardioversion and treated with antiarrhythmic medications in the past.  Recurrent episode of A. fib with RVR may most likely secondary be due to the stress from underlying sepsis caused by HCAP.  Recommended rate control therapy for now.  Start home dose metoprolol.  Start Cardizem gtt for rate control.   Continue oral anticoagulation if no contraindications.  In regards to antiarrhythmic medication (i.e. restarting Tikosyn) would hold off for now. Patient is currently on broad-spectrum IV antibiotics which may effects his QT intervals on EKG and may impact reloading of Tikosyn.  I did discuss with the patient that we could reconsider this later once his fevers and leukocytosis improves.  He verbalizes understanding.  Long-term oral anticoagulation: Currently on Eliquis for thromboembolic prophylaxis.  Recent ischemic left MCA stroke: Recommend aggressive risk factor modifications for secondary prevention.  Currently managed per primary team.  Sepsis: Patient presents with fevers, elevated leukocytosis, tachypnea, tachycardic, chest x-ray suggestive of right middle lobe consolidation  either HCAP or aspiration pneumonia: Currently managed per primary team  Patient's questions and concerns were addressed to his satisfaction. He voices understanding of the instructions provided during this encounter.   This note was created using a voice recognition software as a result there may be grammatical errors inadvertently enclosed that do not reflect the nature of this encounter. Every attempt is made to correct such errors.  Rex Kras, DO, [facility] [place] Office: [phone] 03/06/2020, 6:14 PM

## 2020-03-06 NOTE — ED Provider Notes (Signed)
[facility] EMERGENCY DEPARTMENT Provider Note   CSN: [id number] Arrival date & time: 03/06/20  5681     History Chief Complaint  Patient presents with  . Weakness  . Fever    Dustin Schneider is a 64 y.o. male.  Patient is a 63 year old gentleman with past medical history of hypertension who was recently discharged from the hospital about 4 days ago after a lengthy stay for a new stroke.  Patient began to have a fever yesterday.  Denies any cough, shortness of breath, nausea, vomiting, chest pain.  Has been taking medications as prescribed.  Patient has difficulty with ataxia and speech due to stroke he has right-sided weakness and is unable to ambulate on his own at baseline        Past Medical History:  Diagnosis Date  . Hypertension   . NICM (nonischemic cardiomyopathy) ([place]) 06/15/2018   04/23/18: EF 45%, 09/15/18: NOrmal LVEF  . Paroxysmal atrial fibrillation ([place]) 06/15/2018  . Stroke ([place])   . Visit for monitoring Tikosyn therapy 06/12/2018    Patient Active Problem List   Diagnosis Date Noted  . Sepsis due to pneumonia ([place]) 03/06/2020  . Spastic hemiplegia affecting nondominant side ([place])   . History of hypertension   . Expressive aphasia   . AKI (acute kidney injury) ([place])   . Global aphasia   . Hypoalbuminemia due to protein-calorie malnutrition ([place])   . Dysphagia, post-stroke   . PAF (paroxysmal atrial fibrillation) ([place])   . Hyperlipidemia 02/02/2020  . Dysphagia following cerebral infarction 02/02/2020  . Aspiration pneumonia ([place]) 02/02/2020  . Acute ischemic left MCA stroke ([place]) 02/02/2020  . Acute respiratory failure ([place])   . Acute ischemic stroke [facility]) s/p clot retrieval L MCA & ACA A3 01/27/2020  . Aspiration into airway 01/27/2020  . Chronic anticoagulation 01/27/2020  . Paroxysmal atrial fibrillation ([place]) 06/15/2018  . Essential hypertension 06/15/2018  . NICM (nonischemic cardiomyopathy) ([place]) 06/15/2018  . Visit for  monitoring Tikosyn therapy 06/12/2018    Past Surgical History:  Procedure Laterality Date  . CARDIOVERSION N/A 05/13/2018   Procedure: CARDIOVERSION;  Surgeon: [name], MD;  Location: [facility] ENDOSCOPY;  Service: Cardiovascular;  Laterality: N/A;  . CARDIOVERSION N/A 06/13/2018   Procedure: CARDIOVERSION;  Surgeon: [name], MD;  Location: [facility] ENDOSCOPY;  Service: Cardiovascular;  Laterality: N/A;  . CARDIOVERSION N/A 06/23/2018   Procedure: CARDIOVERSION;  Surgeon: [name], MD;  Location: [place];  Service: Cardiovascular;  Laterality: N/A;  . excision of actinic keratosis    . EYE SURGERY     eye lift  . IR CT HEAD LTD  01/27/2020  . IR PERCUTANEOUS ART THROMBECTOMY/INFUSION INTRACRANIAL INC DIAG ANGIO  01/27/2020      . IR PERCUTANEOUS ART THROMBECTOMY/INFUSION INTRACRANIAL INC DIAG ANGIO  01/27/2020  . RADIOLOGY WITH ANESTHESIA N/A 01/27/2020   Procedure: IR WITH ANESTHESIA;  Surgeon: Radiologist, Medication, MD;  Location: [place];  Service: Radiology;  Laterality: N/A;  . TONSILLECTOMY    . WISDOM TOOTH EXTRACTION         Family History  Problem Relation Age of Onset  . Glaucoma Mother   . Atrial fibrillation Father     Social History   Tobacco Use  . Smoking status: Never Smoker  . Smokeless tobacco: Never Used  Vaping Use  . Vaping Use: Never used  Substance Use Topics  . Alcohol use: Yes    Comment: very little  . Drug use: No    Home Medications  Prior to Admission medications   Medication Sig Start Date End Date Taking? Authorizing Provider  acetaminophen (TYLENOL) 325 MG tablet Take 1-2 tablets (325-650 mg total) by mouth every 4 (four) hours as needed for mild pain. 02/29/20  Yes Love, Ivan Anchors, PA-C  Amino Acids-Protein Hydrolys (FEEDING SUPPLEMENT, PRO-STAT SUGAR FREE 64,) LIQD Take 30 mLs by mouth 2 (two) times daily. 03/02/20  Yes Love, Ivan Anchors, PA-C  apixaban (ELIQUIS) 5 MG TABS tablet Take 1 tablet (5 mg total) by mouth 2 (two) times daily.  03/02/20 04/01/20 Yes Love, Ivan Anchors, PA-C  bethanechol (URECHOLINE) 25 MG tablet Take 1 tablet (25 mg total) by mouth 3 (three) times daily. 03/02/20  Yes Love, Ivan Anchors, PA-C  metoprolol tartrate (LOPRESSOR) 25 MG tablet Take 1 tablet (25 mg total) by mouth 2 (two) times daily. 03/02/20  Yes Love, Ivan Anchors, PA-C  rosuvastatin (CRESTOR) 10 MG tablet Take 1 tablet (10 mg total) by mouth daily after supper. 03/02/20  Yes Love, Ivan Anchors, PA-C  senna-docusate (SENOKOT-S) 8.6-50 MG tablet Take 1 tablet by mouth at bedtime as needed for mild constipation. 02/02/20  Yes Donzetta Starch, NP  tamsulosin (FLOMAX) 0.4 MG CAPS capsule Take 1 capsule (0.4 mg total) by mouth daily at 8 pm. 03/02/20  Yes Love, Ivan Anchors, PA-C  tiZANidine (ZANAFLEX) 2 MG tablet Take 1 tablet (2 mg total) by mouth 3 (three) times daily. 03/02/20  Yes Love, Ivan Anchors, PA-C    Allergies    Patient has no known allergies.  Review of Systems   Review of Systems  Constitutional: Positive for fatigue and fever. Negative for appetite change and chills.  HENT: Negative.   Eyes: Negative for photophobia.  Respiratory: Negative.   Cardiovascular: Negative.   Gastrointestinal: Negative for abdominal pain, diarrhea and nausea.  Genitourinary: Negative for dysuria.  Musculoskeletal: Negative.   Skin: Negative for rash.  Neurological: Negative for dizziness, light-headedness and headaches.  Psychiatric/Behavioral: Negative for confusion.    Physical Exam Updated Vital Signs BP (!) 115/98   Pulse (!) 113   Temp (!) 102.3 F (39.1 C) (Oral)   Resp (!) 27   Ht 5\' 9"  (1.753 m)   Wt 77.1 kg   SpO2 100%   BMI 25.10 kg/m   Physical Exam Vitals and nursing note reviewed. Exam conducted with a chaperone present.  Constitutional:      General: He is not in acute distress.    Appearance: Normal appearance. He is not ill-appearing, toxic-appearing or diaphoretic.  HENT:     Head: Normocephalic and atraumatic.     Nose: Nose normal.      Mouth/Throat:     Mouth: Mucous membranes are moist.  Eyes:     Conjunctiva/sclera: Conjunctivae normal.     Pupils: Pupils are equal, round, and reactive to light.  Cardiovascular:     Rate and Rhythm: Normal rate and regular rhythm.  Pulmonary:     Effort: Pulmonary effort is normal.     Breath sounds: Normal breath sounds. No wheezing.  Skin:    General: Skin is dry.  Neurological:     Mental Status: He is alert.     Comments: Right-sided weakness.  Difficulty with speech.  This is baseline.  Psychiatric:        Mood and Affect: Mood normal.     ED Results / Procedures / Treatments   Labs (all labs ordered are listed, but only abnormal results are displayed) Labs Reviewed  LACTIC ACID, PLASMA -  Abnormal; Notable for the following components:      Result Value   Lactic Acid, Venous 2.6 (*)    All other components within normal limits  COMPREHENSIVE METABOLIC PANEL - Abnormal; Notable for the following components:   Glucose, Bld 136 (*)    Total Protein 6.4 (*)    Albumin 2.9 (*)    All other components within normal limits  CBC WITH DIFFERENTIAL/PLATELET - Abnormal; Notable for the following components:   WBC 12.4 (*)    RBC 4.16 (*)    Neutro Abs 10.5 (*)    All other components within normal limits  URINALYSIS, ROUTINE W REFLEX MICROSCOPIC - Abnormal; Notable for the following components:   Color, Urine AMBER (*)    APPearance CLOUDY (*)    Specific Gravity, Urine 1.032 (*)    Hgb urine dipstick MODERATE (*)    Bilirubin Urine SMALL (*)    Protein, ur 100 (*)    Nitrite POSITIVE (*)    Leukocytes,Ua MODERATE (*)    WBC, UA >50 (*)    Bacteria, UA MANY (*)    Non Squamous Epithelial 0-5 (*)    All other components within normal limits  SARS CORONAVIRUS 2 BY RT PCR (HOSPITAL ORDER, [place] [facility])  CULTURE, BLOOD (ROUTINE X 2)  CULTURE, BLOOD (ROUTINE X 2)  MRSA PCR SCREENING  EXPECTORATED SPUTUM ASSESSMENT W REFEX TO RESP CULTURE    LACTIC ACID, PLASMA  HIV ANTIBODY (ROUTINE TESTING W REFLEX)  STREP PNEUMONIAE URINARY ANTIGEN  PROCALCITONIN  LACTIC ACID, PLASMA  MAGNESIUM  LEGIONELLA PNEUMOPHILA SEROGP 1 UR AG  LACTIC ACID, PLASMA    EKG None  Radiology DG Chest 2 View  Result Date: 03/06/2020 CLINICAL DATA:  Fevers for 2 days. EXAM: CHEST - 2 VIEW COMPARISON:  01/30/2020 FINDINGS: Normal heart size. No pleural effusion or edema identified. Airspace consolidation within the right midlung is identified concerning for pneumonia. This is a new finding compared with 01/30/2020. Visualized osseous structures are unremarkable. IMPRESSION: Right midlung airspace consolidation concerning for pneumonia. Followup PA and lateral chest X-ray is recommended in 3-4 weeks following trial of antibiotic therapy to ensure resolution and exclude underlying malignancy. Electronically Signed   By: Kerby Moors M.D.   On: 03/06/2020 08:15    Procedures Procedures (including critical care time)  Medications Ordered in ED Medications  ceFEPIme (MAXIPIME) 2 g in sodium chloride 0.9 % 100 mL IVPB (has no administration in time range)  vancomycin (VANCOCIN) IVPB 1000 mg/200 mL premix (has no administration in time range)  rosuvastatin (CRESTOR) tablet 10 mg (has no administration in time range)  bethanechol (URECHOLINE) tablet 25 mg (has no administration in time range)  senna-docusate (Senokot-S) tablet 1 tablet (has no administration in time range)  apixaban (ELIQUIS) tablet 5 mg (has no administration in time range)  tiZANidine (ZANAFLEX) tablet 2 mg (has no administration in time range)  tamsulosin (FLOMAX) capsule 0.4 mg (has no administration in time range)  acetaminophen (TYLENOL) tablet 650 mg (has no administration in time range)  guaiFENesin (MUCINEX) 12 hr tablet 600 mg (600 mg Oral Given 03/06/20 1323)  metoprolol tartrate (LOPRESSOR) tablet 25 mg (has no administration in time range)  acetaminophen (TYLENOL) tablet 1,000  mg (1,000 mg Oral Given 03/06/20 0958)  vancomycin (VANCOCIN) IVPB 1000 mg/200 mL premix (0 mg Intravenous Stopped 03/06/20 1211)  ceFEPIme (MAXIPIME) 2 g in sodium chloride 0.9 % 100 mL IVPB (0 g Intravenous Stopped 03/06/20 1146)  sodium chloride 0.9 %  bolus 1,000 mL (0 mLs Intravenous Stopped 03/06/20 1146)  vancomycin (VANCOREADY) IVPB 750 mg/150 mL (0 mg Intravenous Stopped 03/06/20 1343)  sodium chloride 0.9 % bolus 1,000 mL (1,000 mLs Intravenous New Bag/Given 03/06/20 1323)    ED Course  I have reviewed the triage vital signs and the nursing notes.  Pertinent labs & imaging results that were available during my care of the patient were reviewed by me and considered in my medical decision making (see chart for details).  Clinical Course as of Mar 06 1513  Sun Mar 06, 2020  1217 Patient presenting with fever after recent admission for stroke. Workup shows HAP. He is febrile but normal oxygen on room air. Broad spectrum abx started. Also concer for possible aspiration pneumonia given recent stroke. Patient initial lactic normal but repeat lactic 2.6 so fluids initiated. BP normal. Covid negative Discussed with hospialist to admit   [KM]    Clinical Course User Index [KM] Kristine Royal   MDM Rules/Calculators/A&P                          The patient appears reasonably stabilized for admission considering the current resources, flow, and capabilities available in the ED at this time, and I doubt any other [facility] requiring further screening and/or treatment in the ED prior to admission.  Final Clinical Impression(s) / ED Diagnoses Final diagnoses:  HAP (hospital-acquired pneumonia)    Rx / DC Orders ED Discharge Orders    None       Kristine Royal 03/06/20 1515    Little, Wenda Overland, MD 03/06/20 1610

## 2020-03-06 NOTE — ED Notes (Signed)
Cardizem will be administered once delivered by pharmacy.

## 2020-03-07 ENCOUNTER — Ambulatory Visit: Payer: BC Managed Care – PPO | Admitting: Occupational Therapy

## 2020-03-07 ENCOUNTER — Ambulatory Visit: Payer: BC Managed Care – PPO | Admitting: Physical Therapy

## 2020-03-07 LAB — BASIC METABOLIC PANEL
Anion gap: 8 (ref 5–15)
BUN: 9 mg/dL (ref 8–23)
CO2: 24 mmol/L (ref 22–32)
Calcium: 8.3 mg/dL — ABNORMAL LOW (ref 8.9–10.3)
Chloride: 106 mmol/L (ref 98–111)
Creatinine, Ser: 1.02 mg/dL (ref 0.61–1.24)
GFR calc Af Amer: >60 mL/min (ref >60)
GFR calc non Af Amer: >60 mL/min (ref >60)
Glucose, Bld: 131 mg/dL — ABNORMAL HIGH (ref 70–99)
Potassium: 4.2 mmol/L (ref 3.5–5.1)
Sodium: 138 mmol/L (ref 135–145)

## 2020-03-07 LAB — CBC
HCT: 34.8 % — ABNORMAL LOW (ref 39.0–52.0)
Hemoglobin: 11.3 g/dL — ABNORMAL LOW (ref 13.0–17.0)
MCH: 31.5 pg (ref 26.0–34.0)
MCHC: 32.5 g/dL (ref 30.0–36.0)
MCV: 96.9 fL (ref 80.0–100.0)
Platelets: 180 10*3/uL (ref 150–400)
RBC: 3.59 MIL/uL — ABNORMAL LOW (ref 4.22–5.81)
RDW: 13.6 % (ref 11.5–15.5)
WBC: 9.2 10*3/uL (ref 4.0–10.5)
nRBC: 0 % (ref 0.0–0.2)

## 2020-03-07 LAB — LACTIC ACID, PLASMA: Lactic Acid, Venous: 1.1 mmol/L (ref 0.5–1.9)

## 2020-03-07 MED ORDER — SODIUM CHLORIDE 0.9 % IV SOLN: 600.0000 mg | Freq: Once | INTRAVENOUS | Status: DC

## 2020-03-07 MED ORDER — SODIUM CHLORIDE 0.9 % IV SOLN
500.0000 mg | INTRAVENOUS | Status: DC
  Administered 2020-03-07: 500 mg via INTRAVENOUS
  Filled 2020-03-07: qty 500

## 2020-03-07 MED ORDER — SODIUM CHLORIDE 0.9 % IV SOLN
INTRAVENOUS | Status: DC | PRN
  Administered 2020-03-09: 250 mL via INTRAVENOUS

## 2020-03-07 MED ORDER — IBUPROFEN 600 MG PO TABS
600.0000 mg | ORAL_TABLET | Freq: Once | ORAL | Status: AC
  Administered 2020-03-07: 600 mg via ORAL
  Filled 2020-03-07: qty 1

## 2020-03-07 NOTE — ED Notes (Signed)
Lunch Tray Ordered @ 1027. °

## 2020-03-07 NOTE — Progress Notes (Signed)
   03/07/20 2258  Assess: MEWS Score  Temp 100.3 F (37.9 C)  BP (!) 130/99  Pulse Rate 101  Resp 20  SpO2 95 %  O2 Device Room Air  Assess: MEWS Score  MEWS Temp 0  MEWS Systolic 0  MEWS Pulse 1  MEWS RR 0  MEWS LOC 0  MEWS Score 1  MEWS Score Color Green  Stabilizing after treatment

## 2020-03-07 NOTE — Progress Notes (Signed)
   03/07/20 1945  Assess: MEWS Score  Temp (!) 103.1 F (39.5 C)  BP (!) 137/87  Pulse Rate 92  Resp 19  SpO2 95 %  O2 Device Room Air  Assess: MEWS Score  MEWS Temp 2  MEWS Systolic 0  MEWS Pulse 0  MEWS RR 0  MEWS LOC 0  MEWS Score 2  MEWS Score Color Yellow  Assess: if the MEWS score is Yellow or Red  Were vital signs taken at a resting state? Yes  Focused Assessment Change from prior assessment (see assessment flowsheet)  Early Detection of Sepsis Score *See Row Information* High  MEWS guidelines implemented *See Row Information* No, vital signs rechecked  Treat  MEWS Interventions Administered scheduled meds/treatments;Other (Comment)  Notify: Provider  Provider Name/Title Dr Serina Cowper  Date Provider Notified 03/07/20  Time Provider Notified 2774  Notification Type Call  Notification Reason Change in status  Response See new orders  Date of Provider Response 03/07/20  Time of Provider Response 2220  Document  Patient Outcome Stabilized after interventions  Progress note created (see row info) Yes

## 2020-03-07 NOTE — Evaluation (Signed)
Clinical/Bedside Swallow Evaluation Patient Details  Name: Dustin Schneider MRN: [id number] Date of Birth: 13-Dec-1955  Today's Date: 03/07/2020 Time: SLP Start Time (ACUTE ONLY): 6 SLP Stop Time (ACUTE ONLY): 1105 SLP Time Calculation (min) (ACUTE ONLY): 24 min  Past Medical History:  Past Medical History:  Diagnosis Date  . Hypertension   . NICM (nonischemic cardiomyopathy) ([place]) 06/15/2018   04/23/18: EF 45%, 09/15/18: NOrmal LVEF  . Paroxysmal atrial fibrillation ([place]) 06/15/2018  . Stroke ([place])   . Visit for monitoring Tikosyn therapy 06/12/2018   Past Surgical History:  Past Surgical History:  Procedure Laterality Date  . CARDIOVERSION N/A 05/13/2018   Procedure: CARDIOVERSION;  Surgeon: [name], MD;  Location: [facility] ENDOSCOPY;  Service: Cardiovascular;  Laterality: N/A;  . CARDIOVERSION N/A 06/13/2018   Procedure: CARDIOVERSION;  Surgeon: [name], MD;  Location: [facility] ENDOSCOPY;  Service: Cardiovascular;  Laterality: N/A;  . CARDIOVERSION N/A 06/23/2018   Procedure: CARDIOVERSION;  Surgeon: [name], MD;  Location: [place];  Service: Cardiovascular;  Laterality: N/A;  . excision of actinic keratosis    . EYE SURGERY     eye lift  . IR CT HEAD LTD  01/27/2020  . IR PERCUTANEOUS ART THROMBECTOMY/INFUSION INTRACRANIAL INC DIAG ANGIO  01/27/2020      . IR PERCUTANEOUS ART THROMBECTOMY/INFUSION INTRACRANIAL INC DIAG ANGIO  01/27/2020  . RADIOLOGY WITH ANESTHESIA N/A 01/27/2020   Procedure: IR WITH ANESTHESIA;  Surgeon: Radiologist, Medication, MD;  Location: [place];  Service: Radiology;  Laterality: N/A;  . TONSILLECTOMY    . WISDOM TOOTH EXTRACTION     HPI:  64 y.o. male who presents with a chief complaint of fever/weakness, dx sepsis secondary to HCAP. CXR shows right midlung consolidation concerning for pna.  PMHx includes recent left MCA CVA 6/21 with residual aphasia and right hemiplegia.  D/Cd 7/14 from [facility] CIR. MBS was completed 02/16/20 and pt was cleared for a  regular diet with thin liquids (single sips from cup.) Pt has an expressive aphasia; was scheduled for an SLP evaluation at [facility] this week.    Assessment / Plan / Recommendation Clinical Impression  Pt presents with functional swallowing with no s/s of dysphagia- there is adequate mastication, brisk swallow response, and no s/s of aspiration during today's assessment.  Recommend continuing current regular diet, thin liquids, small sips from cup per last MBS recommendations.  Pt wil benefit from ongoing OP SLP to address aphasia once he is D/Cd.  SLP Visit Diagnosis: Dysphagia, oropharyngeal phase (R13.12)    Aspiration Risk       Diet Recommendation   regular solids, thin liquids  Medication Administration: Whole meds with puree    Other  Recommendations Oral Care Recommendations: Oral care BID   Follow up Recommendations None      Frequency and Duration            Prognosis        Swallow Study   General HPI: 64 y.o. male who presents with a chief complaint of fever/weakness, dx sepsis secondary to HCAP. CXR shows right midlung consolidation concerning for pna.  PMHx includes recent left MCA CVA 6/21 with residual aphasia and right hemiplegia.  D/Cd 7/14 from [name] CIR. MBS was completed 02/16/20 and pt was cleared for a regular diet with thin liquids (single sips from cup.) Type of Study: Bedside Swallow Evaluation Previous Swallow Assessment: MBSS 02/16/20 Diet Prior to this Study: Regular;Thin liquids Respiratory Status: Room air History of Recent Intubation: No Behavior/Cognition: Alert;Cooperative;Pleasant  mood Oral Cavity Assessment: Within Functional Limits Oral Care Completed by SLP: Recent completion by staff Oral Cavity - Dentition: Adequate natural dentition Vision: Functional for self-feeding Self-Feeding Abilities: Able to feed self Patient Positioning: Upright in bed Baseline Vocal Quality: Normal Volitional Cough: Strong Volitional Swallow: Able to  elicit    Oral/Motor/Sensory Function Overall Oral Motor/Sensory Function: Mild impairment Facial ROM: Reduced right;Suspected CN VII (facial) dysfunction Facial Symmetry: Abnormal symmetry right;Suspected CN VII (facial) dysfunction Facial Strength: Reduced right;Suspected CN VII (facial) dysfunction Lingual ROM: Within Functional Limits Lingual Symmetry: Within Functional Limits   Ice Chips Ice chips: Not tested   Thin Liquid Thin Liquid: Within functional limits    Nectar Thick Nectar Thick Liquid: Not tested   Honey Thick Honey Thick Liquid: Not tested   Puree     Solid     Solid: Within functional limits      Juan Quam Laurice 03/07/2020,11:15 AM   Estill Bamberg L. Tivis Ringer, [place] Office number [phone] Pager [phone]

## 2020-03-07 NOTE — Progress Notes (Addendum)
Patient is experiencing expressive aphasia with coherence. Wife at the bedside. Came in for PNA receiving antibiotic with temp spiking to 103 oral and rectally Ice pack applied. Paged Md to make aware of Temp and vomiting. Ordered IV Antibiotic and Ibuprofen.

## 2020-03-07 NOTE — Progress Notes (Signed)
PROGRESS NOTE    Dustin Schneider  JKK:[id number] DOB: 10/24/1955 DOA: 03/06/2020 PCP: Kerin Perna, NP   Brief Narrative:  Patient is a 63-year male with history of hypertension, hyperlipidemia, nonischemic cardiomyopathy, paroxysmal A. fib on Eliquis, BPH, recent CVA with residual right-sided hemiparesis and expressive aphasia who presents with 2-day history of fever.  He was just discharged from rehab to home.  On presentation he was febrile, tachycardic, hypertensive.  He had mild elevated WBC counts.He also went into Afib with RVR.  Chest x-ray was concerning for right midlung opacity.  Patient was admitted for the management of healthcare associated  pneumonia.  Assessment & Plan:   Active Problems:   Paroxysmal atrial fibrillation (HCC)   Chronic anticoagulation   Sepsis due to pneumonia ([place])   History of stroke with residual deficit   Transient hypotension   Sepsis secondary to HCAP: Presented with fever, tachycardia, tachypnea, hypotension.  Started on broad-spectrum antibiotics.  Follow-up blood cultures, sputum culture.  Patient is still febrile this morning.  Aspiration pneumonia is also a possibility.  Will request for speech evaluation Blood pressure has improved.  Healthcare associated pneumonia: Patient recently discharged from rehab.  Started on regimen for HCAP.  Will check MRSA PCR and discontinue vancomycin if negative.  Continue current antibiotics.  Currently respiratory status stable.  On room air.  Paroxysmal A. fib: Was in A. fib with RVR on presentation.  On Tikosyn previously.  He follows with Dr. [name].  Cardiology is following here.  Started on metoprolol and Cardizem drip.  Recent CVA with right-sided residual hemiparesis and expressive aphasia: Had a stroke on 6/21 and is status post clot removal of the left MCA/ACA.  Continue statin and Eliquis. PT/speech consultation requested  Chronic diastolic CHF: Currently euvolemic.  Last echocardiogram  showed ejection fraction of 55 to 71%, grade 2 diastolic dysfunction.  BPH: On tamsulosin  Hyperlipidemia: On Crestor           DVT prophylaxis:Eliquis Code Status: Full Family Communication: Brother in law present at the bedside  status is: Inpatient  Remains inpatient appropriate because:Needs IV antibiotics   Dispo: The patient is from: Home              Anticipated d/c is to: Home              Anticipated d/c date is: 2 days              Patient currently is not medically stable to d/c.     Consultants: Cardiology  Procedures:None  Antimicrobials:  Anti-infectives (From admission, onward)   Start     Dose/Rate Route Frequency Ordered Stop   03/07/20 0100  vancomycin (VANCOCIN) IVPB 1000 mg/200 mL premix     Discontinue     1,000 mg 200 mL/hr over 60 Minutes Intravenous Every 12 hours 03/06/20 1129     03/06/20 2000  ceFEPIme (MAXIPIME) 2 g in sodium chloride 0.9 % 100 mL IVPB     Discontinue     2 g 200 mL/hr over 30 Minutes Intravenous Every 8 hours 03/06/20 1129     03/06/20 1200  vancomycin (VANCOREADY) IVPB 750 mg/150 mL        750 mg 150 mL/hr over 60 Minutes Intravenous  Once 03/06/20 1122 03/06/20 1343   03/06/20 1030  vancomycin (VANCOCIN) IVPB 1000 mg/200 mL premix        1,000 mg 200 mL/hr over 60 Minutes Intravenous  Once 03/06/20 1029 03/06/20 1211   03/06/20  1030  ceFEPIme (MAXIPIME) 2 g in sodium chloride 0.9 % 100 mL IVPB        2 g 200 mL/hr over 30 Minutes Intravenous  Once 03/06/20 1029 03/06/20 1146      Subjective: Patient seen and examined at the bedside this morning in the emergency department.  He was hemodynamically stable during my evaluation.  Heart rate is better.  He was saturating fine on room air.  He was not in any kind of respiratory distress.  Still febrile this morning.  Objective: Vitals:   03/07/20 0400 03/07/20 0500 03/07/20 0900 03/07/20 0930  BP: 105/79 103/83 116/78 116/78  Pulse: 88 99 92 103  Resp: 22 20 (!)  27 (!) 25  Temp: 99.9 F (37.7 C)   (!) 102.9 F (39.4 C)  TempSrc: Oral   Oral  SpO2: 94% 94% 94% 94%  Weight:      Height:        Intake/Output Summary (Last 24 hours) at 03/07/2020 0939 Last data filed at 03/07/2020 7829 Gross per 24 hour  Intake 1200 ml  Output 500 ml  Net 700 ml   Filed Weights   03/06/20 0720  Weight: 77.1 kg    Examination:  General exam: Deconditioned debilitated, chronically looking HEENT:PERRL,Oral mucosa moist, Ear/Nose normal on gross exam Respiratory system: Bilateral equal air entry, normal vesicular breath sounds, no wheezes or crackles  Cardiovascular system: Irregularly irregular rhythm, no JVD, murmurs, rubs, gallops or clicks. No pedal edema. Gastrointestinal system: Abdomen is nondistended, soft and nontender. No organomegaly or masses felt. Normal bowel sounds heard. Central nervous system: Alert and oriented.  Expressive aphasia, right hemiparesis Extremities: No edema, no clubbing ,no cyanosis. Skin: No rashes, lesions or ulcers,no icterus ,no pallor  Data Reviewed: I have personally reviewed following labs and imaging studies  CBC: Recent Labs  Lab 03/06/20 0733 03/07/20 0434  WBC 12.4* 9.2  NEUTROABS 10.5*  --   HGB 13.2 11.3*  HCT 40.6 34.8*  MCV 97.6 96.9  PLT 201 562   Basic Metabolic Panel: Recent Labs  Lab 03/06/20 0733 03/06/20 1342 03/07/20 0434  NA 138  --  138  K 4.3  --  4.2  CL 99  --  106  CO2 26  --  24  GLUCOSE 136*  --  131*  BUN 10  --  9  CREATININE 1.04  --  1.02  CALCIUM 9.0  --  8.3*  MG  --  1.9  --    GFR: Estimated Creatinine Clearance: 72.2 mL/min (by C-G formula based on SCr of 1.02 mg/dL). Liver Function Tests: Recent Labs  Lab 03/06/20 0733  AST 22  ALT 31  ALKPHOS 75  BILITOT 0.9  PROT 6.4*  ALBUMIN 2.9*   No results for input(s): LIPASE, AMYLASE in the last 168 hours. No results for input(s): AMMONIA in the last 168 hours. Coagulation Profile: No results for input(s):  INR, PROTIME in the last 168 hours. Cardiac Enzymes: No results for input(s): CKTOTAL, CKMB, CKMBINDEX, TROPONINI in the last 168 hours. BNP (last 3 results) No results for input(s): PROBNP in the last 8760 hours. HbA1C: No results for input(s): HGBA1C in the last 72 hours. CBG: No results for input(s): GLUCAP in the last 168 hours. Lipid Profile: No results for input(s): CHOL, HDL, LDLCALC, TRIG, CHOLHDL, LDLDIRECT in the last 72 hours. Thyroid Function Tests: No results for input(s): TSH, T4TOTAL, FREET4, T3FREE, THYROIDAB in the last 72 hours. Anemia Panel: No results for input(s): VITAMINB12,  FOLATE, FERRITIN, TIBC, IRON, RETICCTPCT in the last 72 hours. Sepsis Labs: Recent Labs  Lab 03/06/20 0733 03/06/20 1034 03/06/20 1342 03/07/20 0307  PROCALCITON  --   --  0.10  --   LATICACIDVEN 1.5 2.6* 1.4 1.1    Recent Results (from the past 240 hour(s))  SARS Coronavirus 2 by RT PCR (hospital order, performed in [facility] hospital lab) Nasopharyngeal Nasopharyngeal Swab     Status: None   Collection Time: 03/06/20  7:40 AM   Specimen: Nasopharyngeal Swab  Result Value Ref Range Status   SARS Coronavirus 2 NEGATIVE NEGATIVE Final    Comment: (NOTE) SARS-CoV-2 target nucleic acids are NOT DETECTED.  The SARS-CoV-2 RNA is generally detectable in upper and lower respiratory specimens during the acute phase of infection. The lowest concentration of SARS-CoV-2 viral copies this assay can detect is 250 copies / mL. A negative result does not preclude SARS-CoV-2 infection and should not be used as the sole basis for treatment or other patient management decisions.  A negative result may occur with improper specimen collection / handling, submission of specimen other than nasopharyngeal swab, presence of viral mutation(s) within the areas targeted by this assay, and inadequate number of viral copies (<250 copies / mL). A negative result must be combined with clinical observations,  patient history, and epidemiological information.  Fact Sheet for Patients:   StrictlyIdeas.no  Fact Sheet for Healthcare Providers: BankingDealers.co.za  This test is not yet approved or  cleared by the [place] FDA and has been authorized for detection and/or diagnosis of SARS-CoV-2 by FDA under an Emergency Use Authorization (EUA).  This EUA will remain in effect (meaning this test can be used) for the duration of the COVID-19 declaration under Section 564(b)(1) of the Act, 21 U.S.C. section 360bbb-3(b)(1), unless the authorization is terminated or revoked sooner.  Performed at [facility], [place] [address], [place], [place] [address]   Blood culture (routine x 2)     Status: None (Preliminary result)   Collection Time: 03/06/20 10:26 AM   Specimen: BLOOD  Result Value Ref Range Status   Specimen Description BLOOD RIGHT ANTECUBITAL  Final   Special Requests   Final    BOTTLES DRAWN AEROBIC AND ANAEROBIC Blood Culture adequate volume Performed at [facility], [place] [address] [place], [place] [address]    Culture PENDING  Incomplete   Report Status PENDING  Incomplete  Blood culture (routine x 2)     Status: None (Preliminary result)   Collection Time: 03/06/20 10:26 AM   Specimen: BLOOD LEFT WRIST  Result Value Ref Range Status   Specimen Description BLOOD LEFT WRIST  Final   Special Requests   Final    BOTTLES DRAWN AEROBIC AND ANAEROBIC Blood Culture adequate volume Performed at [facility], [place] [address], [place], [place] [address]    Culture PENDING  Incomplete   Report Status PENDING  Incomplete  MRSA PCR Screening     Status: None   Collection Time: 03/06/20 11:55 AM   Specimen: Nasal Mucosa; Nasopharyngeal  Result Value Ref Range Status   MRSA by PCR NEGATIVE NEGATIVE Final    Comment:        The GeneXpert MRSA Assay (FDA approved for NASAL specimens only), is one component of  a comprehensive MRSA colonization surveillance program. It is not intended to diagnose MRSA infection nor to guide or monitor treatment for MRSA infections. Performed at [facility], [place] [address]  [place], [place] [address]          Radiology Studies: DG Chest 2 View  Result Date: 03/06/2020 CLINICAL DATA:  Fevers for 2 days. EXAM: CHEST - 2 VIEW COMPARISON:  01/30/2020 FINDINGS: Normal heart size. No pleural effusion or edema identified. Airspace consolidation within the right midlung is identified concerning for pneumonia. This is a new finding compared with 01/30/2020. Visualized osseous structures are unremarkable. IMPRESSION: Right midlung airspace consolidation concerning for pneumonia. Followup PA and lateral chest X-ray is recommended in 3-4 weeks following trial of antibiotic therapy to ensure resolution and exclude underlying malignancy. Electronically Signed   By: Kerby Moors M.D.   On: 03/06/2020 08:15        Scheduled Meds: . apixaban  5 mg Oral BID  . bethanechol  25 mg Oral TID  . guaiFENesin  600 mg Oral BID  . metoprolol tartrate  25 mg Oral BID  . rosuvastatin  10 mg Oral QPC supper  . tamsulosin  0.4 mg Oral Q2000   Continuous Infusions: . ceFEPime (MAXIPIME) IV Stopped (03/07/20 0804)  . diltiazem (CARDIZEM) infusion 12.5 mg/hr (03/07/20 1540)  . vancomycin Stopped (03/07/20 0216)     LOS: 1 day    Time spent: 35 mins.More than 50% of that time was spent in counseling and/or coordination of care.      Shelly Coss, MD [facility] P7/26/2021, 9:39 AM

## 2020-03-08 ENCOUNTER — Ambulatory Visit: Payer: BC Managed Care – PPO

## 2020-03-08 MED ORDER — SODIUM CHLORIDE 0.9% FLUSH: 3.0000 mL | INTRAVENOUS | Status: DC | PRN

## 2020-03-08 MED ORDER — SODIUM CHLORIDE 0.9% FLUSH
3.0000 mL | Freq: Two times a day (BID) | INTRAVENOUS | Status: DC
  Administered 2020-03-09 – 2020-03-11 (×2): 3 mL via INTRAVENOUS

## 2020-03-08 MED ORDER — DOFETILIDE 250 MCG PO CAPS
500.0000 ug | ORAL_CAPSULE | Freq: Two times a day (BID) | ORAL | Status: DC
  Administered 2020-03-08 – 2020-03-11 (×4): 500 ug via ORAL
  Filled 2020-03-08 (×5): qty 2

## 2020-03-08 MED ORDER — SODIUM CHLORIDE 0.9 % IV SOLN: 250.0000 mL | INTRAVENOUS | Status: DC | PRN

## 2020-03-08 MED ORDER — SODIUM CHLORIDE 0.9 % IV SOLN
100.0000 mg | Freq: Two times a day (BID) | INTRAVENOUS | Status: DC
  Administered 2020-03-09 (×3): 100 mg via INTRAVENOUS
  Filled 2020-03-08 (×5): qty 100

## 2020-03-08 MED ORDER — MAGNESIUM SULFATE 2 GM/50ML IV SOLN
2.0000 g | Freq: Once | INTRAVENOUS | Status: AC
  Administered 2020-03-08: 2 g via INTRAVENOUS
  Filled 2020-03-08: qty 50

## 2020-03-08 NOTE — Evaluation (Signed)
Physical Therapy Evaluation Patient Details Name: Dustin Schneider MRN: [id number] DOB: 1956-07-07 Today's Date: 03/08/2020   History of Present Illness  64 y.o. male who presents with a chief complaint of fever/weakness, dx sepsis secondary to HCAP. CXR shows right midlung consolidation concerning for pna.  PMHx includes recent left MCA CVA 6/21 with residual aphasia and right hemiplegia. Discharged home from [facility] 03/02/20.    Clinical Impression  Pt admitted with above diagnosis. On eval, pt required mod assist bed mobility, mod assist sit to stand, and mod assist SPT. He presents with R hemiparesis and aphasia from recent CVA.  Pt currently with functional limitations due to the deficits listed below (see PT Problem List). Pt will benefit from skilled PT to increase their independence and safety with mobility to allow discharge to the venue listed below.       Follow Up Recommendations SNF;Supervision/Assistance - 24 hour    Equipment Recommendations  None recommended by PT    Recommendations for Other Services       Precautions / Restrictions Precautions Precautions: Fall;Other (comment) Precaution Comments: dense R hemiparesis, aphasia      Mobility  Bed Mobility Overal bed mobility: Needs Assistance Bed Mobility: Supine to Sit     Supine to sit: Mod assist;HOB elevated     General bed mobility comments: +rail, increased time, assist with RLE and to elevate trunk  Transfers Overall transfer level: Needs assistance Equipment used: None Transfers: Sit to/from [facility] Sit to Stand: Mod assist Stand pivot transfers: Mod assist       General transfer comment: mod assist to power up. Assist to stabilize balance. Pivot transfer toward left.  Ambulation/Gait             General Gait Details: deferred  Stairs            Wheelchair Mobility    Modified Rankin (Stroke Patients Only)       Balance Overall balance assessment: Needs  assistance Sitting-balance support: Feet supported;No upper extremity supported Sitting balance-Leahy Scale: Good     Standing balance support: Single extremity supported;During functional activity Standing balance-Leahy Scale: Poor Standing balance comment: reliant on external support                             Pertinent Vitals/Pain Pain Assessment: Faces Faces Pain Scale: No hurt    Home Living Family/patient expects to be discharged to:: Private residence Living Arrangements: Spouse/significant other Available Help at Discharge: Family;Friend(s);Available 24 hours/day Type of Home: House Home Access: Stairs to enter Entrance Stairs-Rails: None Entrance Stairs-Number of Steps: 1 Home Layout: One level Home Equipment: Wheelchair - manual;Shower seat      Prior Function Level of Independence: Needs assistance   Gait / Transfers Assistance Needed: independent prior to CVA June 2021. Upon d/c from CIR, min assist transfers, +2 mod/HHA ambulation 70', primarily w/c dependent  ADL's / Homemaking Assistance Needed: family assisting with all ADLs  Comments: Dustin Schneider, pt's BIL, present in room to confirm home information.     Hand Dominance   Dominant Hand: Right    Extremity/Trunk Assessment   Upper Extremity Assessment Upper Extremity Assessment: Defer to OT evaluation    Lower Extremity Assessment Lower Extremity Assessment: RLE deficits/detail RLE Deficits / Details: No active movement noted. Increased extensor tone noted with PROM attempts. Able to passively move through full ROM. Pt wears PRAFO at night. RLE Sensation: decreased light touch;decreased proprioception RLE Coordination: decreased  gross motor       Communication   Communication: Expressive difficulties;Receptive difficulties  Cognition Arousal/Alertness: Awake/alert Behavior During Therapy: Flat affect Overall Cognitive Status: Difficult to assess                                  General Comments: aphasic. Following simple commands. Decreased safety awareness.      General Comments General comments (skin integrity, edema, etc.): Dustin Schneider, pt's BIL, present during eval.    Exercises     Assessment/Plan    PT Assessment Patient needs continued PT services  PT Problem List Decreased strength;Decreased mobility;Decreased safety awareness;Decreased coordination;Decreased activity tolerance;Decreased cognition;Decreased balance       PT Treatment Interventions DME instruction;Therapeutic activities;Cognitive remediation;Gait training;Therapeutic exercise;Patient/family education;Balance training;Functional mobility training    PT Goals (Current goals can be found in the Care Plan section)  Acute Rehab PT Goals Patient Stated Goal: unable to state PT Goal Formulation: With patient/family Time For Goal Achievement: 03/22/20 Potential to Achieve Goals: Good    Frequency Min 3X/week   Barriers to discharge        Co-evaluation               AM-PAC PT "6 Clicks" Mobility  Outcome Measure Help needed turning from your back to your side while in a flat bed without using bedrails?: A Little Help needed moving from lying on your back to sitting on the side of a flat bed without using bedrails?: A Lot Help needed moving to and from a bed to a chair (including a wheelchair)?: A Lot Help needed standing up from a chair using your arms (e.g., wheelchair or bedside chair)?: A Little Help needed to walk in hospital room?: A Lot Help needed climbing 3-5 steps with a railing? : Total 6 Click Score: 13    End of Session Equipment Utilized During Treatment: Gait belt Activity Tolerance: Patient tolerated treatment well Patient left: in chair;with call bell/phone within reach;with family/visitor present Nurse Communication: Mobility status PT Visit Diagnosis: Unsteadiness on feet (R26.81);Hemiplegia and hemiparesis;Other abnormalities of gait and mobility  (R26.89) Hemiplegia - Right/Left: Right Hemiplegia - dominant/non-dominant: Dominant    Time: 2376-2831 PT Time Calculation (min) (ACUTE ONLY): 18 min   Charges:   PT Evaluation $PT Eval Moderate Complexity: 1 Mod          Lorrin Goodell, PT  Office # [phone] Pager [phone]   Lorriane Shire 03/08/2020, 9:54 AM

## 2020-03-08 NOTE — Progress Notes (Signed)
   03/08/20 1315  Assess: MEWS Score  Temp (!) 103.1 F (39.5 C)  BP 117/77  Pulse Rate 97  Resp 16  SpO2 93 %  O2 Device Room Air  Assess: MEWS Score  MEWS Temp 2  MEWS Systolic 0  MEWS Pulse 0  MEWS RR 0  MEWS LOC 0  MEWS Score 2  MEWS Score Color Yellow  Assess: if the MEWS score is Yellow or Red  Were vital signs taken at a resting state? Yes  Focused Assessment No change from prior assessment  Early Detection of Sepsis Score *See Row Information* High  MEWS guidelines implemented *See Row Information* Yes  Treat  MEWS Interventions Administered prn meds/treatments  Pain Scale 0-10  Pain Score 0  Take Vital Signs  Increase Vital Sign Frequency  Yellow: Q 2hr X 2 then Q 4hr X 2, if remains yellow, continue Q 4hrs  Escalate  MEWS: Escalate Yellow: discuss with charge nurse/RN and consider discussing with provider and RRT  Notify: Charge Nurse/RN  Name of Charge Nurse/RN Notified Shirlee Limerick  Date Charge Nurse/RN Notified 03/08/20  Time Charge Nurse/RN Notified 1317  Notify: Provider  Provider Name/Title Dr. Tawanna Solo  Date Provider Notified 03/08/20  Time Provider Notified 1317  Notification Type Page  Notification Reason Other (Comment) (spike of temperature)  Document  Progress note created (see row info) Yes    Patient with another spike in fever. Tylenol and ice pack given. Charge RN aware and attending notified

## 2020-03-08 NOTE — Progress Notes (Signed)
Patient sitting in chair and expressing need to show or tell me something. Patient then proceeds to spontaneously move his right arm. This is the first spontaneous movement of his right arm during this admission.

## 2020-03-08 NOTE — NC FL2 (Signed)
[place] LEVEL OF CARE SCREENING TOOL     IDENTIFICATION  Patient Name: Dustin Schneider Birthdate: 1955/08/19 Sex: male Admission Date (Current Location): 03/06/2020  [facility] and [place] Number:  Herbalist and Address:  [facility], [place] [address] [place], [place] [address]      Provider Number: [id number]  Attending Physician Name and Address:  Shelly Coss, MD  Relative Name and Phone Number:  Raynelle Dick    Current Level of Care: Hospital Recommended Level of Care: [place] Prior Approval Number:    Date Approved/Denied:   PASRR Number:    Discharge Plan: SNF    Current Diagnoses: Patient Active Problem List   Diagnosis Date Noted  . Sepsis due to pneumonia ([place]) 03/06/2020  . History of stroke with residual deficit 03/06/2020  . Transient hypotension 03/06/2020  . Spastic hemiplegia affecting nondominant side ([place])   . History of hypertension   . Expressive aphasia   . AKI (acute kidney injury) ([place])   . Global aphasia   . Hypoalbuminemia due to protein-calorie malnutrition ([place])   . Dysphagia, post-stroke   . Hyperlipidemia 02/02/2020  . Dysphagia following cerebral infarction 02/02/2020  . Aspiration pneumonia ([place]) 02/02/2020  . Acute ischemic left MCA stroke ([place]) 02/02/2020  . Acute respiratory failure ([place])   . Acute ischemic stroke [facility]) s/p clot retrieval L MCA & ACA A3 01/27/2020  . Aspiration into airway 01/27/2020  . Chronic anticoagulation 01/27/2020  . Paroxysmal atrial fibrillation ([place]) 06/15/2018  . Essential hypertension 06/15/2018  . NICM (nonischemic cardiomyopathy) ([place]) 06/15/2018  . Visit for monitoring Tikosyn therapy 06/12/2018    Orientation RESPIRATION BLADDER Height & Weight     Self, Time, Situation, Place  Normal Continent Weight: 170 lb 3.1 oz (77.2 kg) Height:  5\' 9"  (175.3 cm)  BEHAVIORAL SYMPTOMS/MOOD NEUROLOGICAL BOWEL NUTRITION STATUS       Continent Diet (See discharge summary)  AMBULATORY STATUS COMMUNICATION OF NEEDS Skin   Extensive Assist Verbally Normal                       Personal Care Assistance Level of Assistance  Bathing, Feeding, Dressing Bathing Assistance: Limited assistance Feeding assistance: Independent Dressing Assistance: Limited assistance     Functional Limitations Info  Sight, Hearing, Speech Sight Info: Adequate Hearing Info: Adequate Speech Info: Adequate    SPECIAL CARE FACTORS FREQUENCY  PT (By licensed PT), OT (By licensed OT)     PT Frequency: 5x a week OT Frequency: 5x a week            Contractures Contractures Info: Not present    Additional Factors Info  Code Status, Allergies Code Status Info: FULL Allergies Info: NKA           Current Medications (03/08/2020):  This is the current hospital active medication list Current Facility-Administered Medications  Medication Dose Route Frequency Provider Last Rate Last Admin  . 0.9 %  sodium chloride infusion   Intravenous PRN Shelly Coss, MD      . acetaminophen (TYLENOL) tablet 650 mg  650 mg Oral Q6H PRN Fuller Plan A, MD   650 mg at 03/08/20 1313  . apixaban (ELIQUIS) tablet 5 mg  5 mg Oral BID Fuller Plan A, MD   5 mg at 03/08/20 0926  . azithromycin (ZITHROMAX) 500 mg in sodium chloride 0.9 % 250 mL IVPB  500 mg Intravenous Q24H Chotiner, Yevonne Aline, MD 250 mL/hr  at 03/07/20 2244 500 mg at 03/07/20 2244  . bethanechol (URECHOLINE) tablet 25 mg  25 mg Oral TID Fuller Plan A, MD   25 mg at 03/08/20 0927  . ceFEPIme (MAXIPIME) 2 g in sodium chloride 0.9 % 100 mL IVPB  2 g Intravenous Q8H Smith, Rondell A, MD 200 mL/hr at 03/08/20 1448 2 g at 03/08/20 1448  . diltiazem (CARDIZEM) 125 mg in dextrose 5% 125 mL (1 mg/mL) infusion  5-15 mg/hr Intravenous Titrated Tolia, Sunit, DO 7.5 mL/hr at 03/08/20 0925 7.5 mg/hr at 03/08/20 0925  . guaiFENesin (MUCINEX) 12 hr tablet 600 mg  600 mg Oral BID Fuller Plan A, MD    600 mg at 03/08/20 0926  . metoprolol tartrate (LOPRESSOR) tablet 25 mg  25 mg Oral BID Fuller Plan A, MD   25 mg at 03/08/20 0927  . rosuvastatin (CRESTOR) tablet 10 mg  10 mg Oral QPC supper Fuller Plan A, MD   10 mg at 03/07/20 1803  . senna-docusate (Senokot-S) tablet 1 tablet  1 tablet Oral QHS PRN Fuller Plan A, MD      . tamsulosin (FLOMAX) capsule 0.4 mg  0.4 mg Oral Q2000 Smith, Rondell A, MD   0.4 mg at 03/07/20 2012  . tiZANidine (ZANAFLEX) tablet 2 mg  2 mg Oral Q8H PRN Norval Morton, MD         Discharge Medications: Please see discharge summary for a list of discharge medications.  Relevant Imaging Results:  Relevant Lab Results:   Additional Information SSN [id number]  Arvella Merles, [place]

## 2020-03-08 NOTE — Progress Notes (Signed)
Patient HR has increased from 120-150s over the last 15-20 minutes. Will increase diltiazem drip by 2.5mg /hr for a new rate of 10mg /hr. Will continue to monitor HR.

## 2020-03-08 NOTE — Progress Notes (Signed)
PROGRESS NOTE    Dustin Schneider  XBD:[id number] DOB: [birth year] DOA: 03/06/2020 PCP: Kerin Perna, NP   Brief Narrative:  Patient is a 8-year male with history of hypertension, hyperlipidemia, nonischemic cardiomyopathy, paroxysmal A. fib on Eliquis, BPH, recent CVA with residual right-sided hemiparesis and expressive aphasia who presents with 2-day history of fever.  He was just discharged from rehab to home.  On presentation he was febrile, tachycardic, hypertensive.  He had mild elevated WBC counts.He also went into Afib with RVR.  Chest x-ray was concerning for right midlung opacity.  Patient was admitted for the management of healthcare associated  pneumonia.  Continue on broad-spectrum antibiotics.  PT evaluated him and recommended SNF on discharge.  Assessment & Plan:   Active Problems:   Paroxysmal atrial fibrillation (HCC)   Chronic anticoagulation   Sepsis due to pneumonia ([place])   History of stroke with residual deficit   Transient hypotension   Sepsis secondary to HCAP: Presented with fever, tachycardia, tachypnea, hypotension.  Started on broad-spectrum antibiotics.  Follow-up blood cultures, sputum culture,NGTD  Patient was febrile this afternoon.  Aspiration pneumonia is also a possibility.  Speech therapy evaluation done.  Currently hemodynamically stable.  Healthcare associated pneumonia: Patient recently discharged from rehab.  Started on regimen for HCAP. MRSA PCR negative so discontinued vancomycin. Continue current antibiotics.  Currently respiratory status stable.  On room air.  Paroxysmal A. fib: Was in A. fib with RVR on presentation.  On Tikosyn previously.  He follows with Dr. [name].  Cardiology is following here.  Started on metoprolol and Cardizem drip.  Heart rate well controlled this morning.  Recent CVA with right-sided residual hemiparesis and expressive aphasia: Had a stroke on 6/21 and is status post clot removal of the left MCA/ACA.  Continue  statin and Eliquis.  Chronic diastolic CHF: Currently euvolemic.  Last echocardiogram showed ejection fraction of 55 to 83%, grade 2 diastolic dysfunction.  BPH: On tamsulosin  Hyperlipidemia: On Crestor           DVT prophylaxis:Eliquis Code Status: Full Family Communication: Brother in law present at the bedside  status is: Inpatient  Remains inpatient appropriate because:Needs IV antibiotics   Dispo: The patient is from: Home              Anticipated d/c is to: SNF              Anticipated d/c date is: 1-2 days              Patient currently is not medically stable to d/c.  Patient is still febrile.  Needs IV antibiotics.     Consultants: Cardiology  Procedures:None  Antimicrobials:  Anti-infectives (From admission, onward)   Start     Dose/Rate Route Frequency Ordered Stop   03/07/20 2230  azithromycin (ZITHROMAX) 500 mg in sodium chloride 0.9 % 250 mL IVPB     Discontinue     500 mg 250 mL/hr over 60 Minutes Intravenous Every 24 hours 03/07/20 2200     03/07/20 0100  vancomycin (VANCOCIN) IVPB 1000 mg/200 mL premix  Status:  Discontinued        1,000 mg 200 mL/hr over 60 Minutes Intravenous Every 12 hours 03/06/20 1129 03/07/20 1442   03/06/20 2000  ceFEPIme (MAXIPIME) 2 g in sodium chloride 0.9 % 100 mL IVPB     Discontinue     2 g 200 mL/hr over 30 Minutes Intravenous Every 8 hours 03/06/20 1129     03/06/20 1200  vancomycin (VANCOREADY) IVPB 750 mg/150 mL        750 mg 150 mL/hr over 60 Minutes Intravenous  Once 03/06/20 1122 03/06/20 1343   03/06/20 1030  vancomycin (VANCOCIN) IVPB 1000 mg/200 mL premix        1,000 mg 200 mL/hr over 60 Minutes Intravenous  Once 03/06/20 1029 03/06/20 1211   03/06/20 1030  ceFEPIme (MAXIPIME) 2 g in sodium chloride 0.9 % 100 mL IVPB        2 g 200 mL/hr over 30 Minutes Intravenous  Once 03/06/20 1029 03/06/20 1146      Subjective: Patient seen and examined at the bedside this morning.  Looks more comfortable  today.  On room air.  Has generalized weakness.  Again he spiked fever in the afternoon today.  Objective: Vitals:   03/08/20 0018 03/08/20 0201 03/08/20 0506 03/08/20 0753  BP: (!) 122/96 111/78 (!) 130/84 120/71  Pulse: 89 105 98 84  Resp: 19 19 19 18   Temp: 99.3 F (37.4 C) 98.7 F (37.1 C) 98.4 F (36.9 C) 99.2 F (37.3 C)  TempSrc: Oral Oral Oral Oral  SpO2: 94% 94% 96% 93%  Weight:   77.2 kg   Height:        Intake/Output Summary (Last 24 hours) at 03/08/2020 0755 Last data filed at 03/08/2020 0500 Gross per 24 hour  Intake 758.08 ml  Output --  Net 758.08 ml   Filed Weights   03/06/20 0720 03/08/20 0506  Weight: 77.1 kg 77.2 kg    Examination:   General exam: Deconditioned, debilitated chronically looking male Respiratory system: Mild crackles on the right side  cardiovascular system: Afib. No JVD, murmurs, rubs, gallops or clicks. Gastrointestinal system: Abdomen is nondistended, soft and nontender. No organomegaly or masses felt. Normal bowel sounds heard. Central nervous system: Alert and oriented.  Dysarthria, right hemiplegia Extremities: No edema, no clubbing ,no cyanosis Skin: No rashes, lesions or ulcers,no icterus ,no pallor  Data Reviewed: I have personally reviewed following labs and imaging studies  CBC: Recent Labs  Lab 03/06/20 0733 03/07/20 0434  WBC 12.4* 9.2  NEUTROABS 10.5*  --   HGB 13.2 11.3*  HCT 40.6 34.8*  MCV 97.6 96.9  PLT 201 696   Basic Metabolic Panel: Recent Labs  Lab 03/06/20 0733 03/06/20 1342 03/07/20 0434  NA 138  --  138  K 4.3  --  4.2  CL 99  --  106  CO2 26  --  24  GLUCOSE 136*  --  131*  BUN 10  --  9  CREATININE 1.04  --  1.02  CALCIUM 9.0  --  8.3*  MG  --  1.9  --    GFR: Estimated Creatinine Clearance: 72.2 mL/min (by C-G formula based on SCr of 1.02 mg/dL). Liver Function Tests: Recent Labs  Lab 03/06/20 0733  AST 22  ALT 31  ALKPHOS 75  BILITOT 0.9  PROT 6.4*  ALBUMIN 2.9*   No  results for input(s): LIPASE, AMYLASE in the last 168 hours. No results for input(s): AMMONIA in the last 168 hours. Coagulation Profile: No results for input(s): INR, PROTIME in the last 168 hours. Cardiac Enzymes: No results for input(s): CKTOTAL, CKMB, CKMBINDEX, TROPONINI in the last 168 hours. BNP (last 3 results) No results for input(s): PROBNP in the last 8760 hours. HbA1C: No results for input(s): HGBA1C in the last 72 hours. CBG: No results for input(s): GLUCAP in the last 168 hours. Lipid Profile: No results for  input(s): CHOL, HDL, LDLCALC, TRIG, CHOLHDL, LDLDIRECT in the last 72 hours. Thyroid Function Tests: No results for input(s): TSH, T4TOTAL, FREET4, T3FREE, THYROIDAB in the last 72 hours. Anemia Panel: No results for input(s): VITAMINB12, FOLATE, FERRITIN, TIBC, IRON, RETICCTPCT in the last 72 hours. Sepsis Labs: Recent Labs  Lab 03/06/20 0733 03/06/20 1034 03/06/20 1342 03/07/20 0307  PROCALCITON  --   --  0.10  --   LATICACIDVEN 1.5 2.6* 1.4 1.1    Recent Results (from the past 240 hour(s))  SARS Coronavirus 2 by RT PCR (hospital order, performed in [facility] hospital lab) Nasopharyngeal Nasopharyngeal Swab     Status: None   Collection Time: 03/06/20  7:40 AM   Specimen: Nasopharyngeal Swab  Result Value Ref Range Status   SARS Coronavirus 2 NEGATIVE NEGATIVE Final    Comment: (NOTE) SARS-CoV-2 target nucleic acids are NOT DETECTED.  The SARS-CoV-2 RNA is generally detectable in upper and lower respiratory specimens during the acute phase of infection. The lowest concentration of SARS-CoV-2 viral copies this assay can detect is 250 copies / mL. A negative result does not preclude SARS-CoV-2 infection and should not be used as the sole basis for treatment or other patient management decisions.  A negative result may occur with improper specimen collection / handling, submission of specimen other than nasopharyngeal swab, presence of viral mutation(s)  within the areas targeted by this assay, and inadequate number of viral copies (<250 copies / mL). A negative result must be combined with clinical observations, patient history, and epidemiological information.  Fact Sheet for Patients:   StrictlyIdeas.no  Fact Sheet for Healthcare Providers: BankingDealers.co.za  This test is not yet approved or  cleared by the [place] FDA and has been authorized for detection and/or diagnosis of SARS-CoV-2 by FDA under an Emergency Use Authorization (EUA).  This EUA will remain in effect (meaning this test can be used) for the duration of the COVID-19 declaration under Section 564(b)(1) of the Act, 21 U.S.C. section 360bbb-3(b)(1), unless the authorization is terminated or revoked sooner.  Performed at [facility], [place] [address], [place], [place] [address]   Blood culture (routine x 2)     Status: None (Preliminary result)   Collection Time: 03/06/20 10:26 AM   Specimen: BLOOD  Result Value Ref Range Status   Specimen Description BLOOD RIGHT ANTECUBITAL  Final   Special Requests   Final    BOTTLES DRAWN AEROBIC AND ANAEROBIC Blood Culture adequate volume   Culture   Final    NO GROWTH < 24 HOURS Performed at [facility], [place] [address], [place], [place] [address]    Report Status PENDING  Incomplete  Blood culture (routine x 2)     Status: None (Preliminary result)   Collection Time: 03/06/20 10:26 AM   Specimen: BLOOD LEFT WRIST  Result Value Ref Range Status   Specimen Description BLOOD LEFT WRIST  Final   Special Requests   Final    BOTTLES DRAWN AEROBIC AND ANAEROBIC Blood Culture adequate volume   Culture   Final    NO GROWTH < 24 HOURS Performed at [facility], [place] [address], [place], [place] [address]    Report Status PENDING  Incomplete  MRSA PCR Screening     Status: None   Collection Time: 03/06/20 11:55 AM   Specimen: Nasal Mucosa; Nasopharyngeal    Result Value Ref Range Status   MRSA by PCR NEGATIVE NEGATIVE Final    Comment:  The GeneXpert MRSA Assay (FDA approved for NASAL specimens only), is one component of a comprehensive MRSA colonization surveillance program. It is not intended to diagnose MRSA infection nor to guide or monitor treatment for MRSA infections. Performed at [facility], [place] [address], [place],  [address]          Radiology Studies: No results found.      Scheduled Meds: . apixaban  5 mg Oral BID  . bethanechol  25 mg Oral TID  . guaiFENesin  600 mg Oral BID  . metoprolol tartrate  25 mg Oral BID  . rosuvastatin  10 mg Oral QPC supper  . tamsulosin  0.4 mg Oral Q2000   Continuous Infusions: . sodium chloride    . azithromycin 500 mg (03/07/20 2244)  . ceFEPime (MAXIPIME) IV 2 g (03/08/20 0559)  . diltiazem (CARDIZEM) infusion 7.5 mg/hr (03/07/20 1656)     LOS: 2 days    Time spent: 25 mins.More than 50% of that time was spent in counseling and/or coordination of care.      Shelly Coss, MD [facility] P7/27/2021, 7:55 AM

## 2020-03-08 NOTE — TOC Initial Note (Signed)
Transition of Care [facility]) - Initial/Assessment Note    Patient Details  Name: Dustin Schneider MRN: [id number] Date of Birth: [birth year]  Transition of Care [facility]) CM/SW Contact:    Jacquelynn Cree Phone Number: 03/08/2020, 3:30 PM  Clinical Narrative:                 CSW met with patient and patient's brother Rosana Fret who was bedside. Permission provided for Mr. McWilliams to be included in the conversation. CSW spoke with patient regarding PT recommendation for SNF placement at discharge. Patient expressed understanding and is in agreement to be faxed out to the [facility] area. CSW explained insurance authorization process. Patient is vaccinated.   Expected Discharge Plan: Skilled Nursing Facility Barriers to Discharge: Insurance Authorization, Continued Medical Work up   Patient Goals and CMS Choice Patient states their goals for this hospitalization and ongoing recovery are:: to get stronger CMS Medicare.gov Compare Post Acute Care list provided to:: Patient Choice offered to / list presented to : Patient  Expected Discharge Plan and Services Expected Discharge Plan: August arrangements for the past 2 months: Single Family Home                                      Prior Living Arrangements/Services Living arrangements for the past 2 months: Single Family Home Lives with:: Significant Other Patient language and need for interpreter reviewed:: Yes Do you feel safe going back to the place where you live?: Yes      Need for Family Participation in Patient Care: No (Comment) Care giver support system in place?: Yes (comment)   Criminal Activity/Legal Involvement Pertinent to Current Situation/Hospitalization: No - Comment as needed  Activities of Daily Living      Permission Sought/Granted Permission sought to share information with : Facility Sport and exercise psychologist, Family Supports Permission granted to share  information with : Yes, Verbal Permission Granted  Share Information with NAME: Gwyneth Sprout  Permission granted to share info w AGENCY: SNFs  Permission granted to share info w Relationship: Brother  Permission granted to share info w Contact Information: [phone]  Emotional Assessment   Attitude/Demeanor/Rapport: Unable to Assess Affect (typically observed): Unable to Assess Orientation: : Oriented to Self, Oriented to Place, Oriented to  Time, Oriented to Situation Alcohol / Substance Use: Not Applicable Psych Involvement: No (comment)  Admission diagnosis:  HAP (hospital-acquired pneumonia) [J18.9, Y95] Sepsis due to pneumonia ([place]) [J18.9, A41.9] Patient Active Problem List   Diagnosis Date Noted  . Sepsis due to pneumonia ([place]) 03/06/2020  . History of stroke with residual deficit 03/06/2020  . Transient hypotension 03/06/2020  . Spastic hemiplegia affecting nondominant side ([place])   . History of hypertension   . Expressive aphasia   . AKI (acute kidney injury) ([place])   . Global aphasia   . Hypoalbuminemia due to protein-calorie malnutrition ([place])   . Dysphagia, post-stroke   . Hyperlipidemia 02/02/2020  . Dysphagia following cerebral infarction 02/02/2020  . Aspiration pneumonia ([place]) 02/02/2020  . Acute ischemic left MCA stroke ([place]) 02/02/2020  . Acute respiratory failure ([place])   . Acute ischemic stroke [facility]) s/p clot retrieval L MCA & ACA A3 01/27/2020  . Aspiration into airway 01/27/2020  . Chronic anticoagulation 01/27/2020  . Paroxysmal atrial fibrillation ([place]) 06/15/2018  . Essential hypertension 06/15/2018  . NICM (nonischemic cardiomyopathy) ([place]) 06/15/2018  .  Visit for monitoring Tikosyn therapy 06/12/2018   PCP:  Kerin Perna, NP Pharmacy:   [place], [place] [address] Phone: [phone] Fax: [phone]     Social Determinants of Health (SDOH)  Interventions    Readmission Risk Interventions No flowsheet data found.

## 2020-03-08 NOTE — Progress Notes (Addendum)
Pharmacy: Dofetilide (Tikosyn) - Initial Consult Assessment and Electrolyte Replacement  Pharmacy consulted to assist in monitoring and replacing electrolytes in this 64 y.o. male admitted on 03/06/2020 undergoing dofetilide initiation.   Assessment:  Patient Exclusion Criteria: If any screening criteria checked as "Yes", then  patient  should NOT receive dofetilide until criteria item is corrected.  If "Yes" please indicate correction plan.  YES  NO Patient  Exclusion Criteria Correction Plan   []   []   Baseline QTc interval is greater than or equal to 440 msec. IF above YES box checked dofetilide contraindicated unless patient has ICD; then may proceed if QTc 500-550 msec or with known ventricular conduction abnormalities may proceed with QTc 550-600 msec. QTc =  395 on 03/07/20    []   [x]   Patient is known or suspected to have a digoxin level greater than 2 ng/ml: No results found for: DIGOXIN     []   [x]   Creatinine clearance less than 20 ml/min (calculated using Cockcroft-Gault, actual body weight and serum creatinine): Estimated Creatinine Clearance: 72.2 mL/min (by C-G formula based on SCr of 1.02 mg/dL).     [x]   []  Patient has received drugs known to prolong the QT intervals within the last 48 hours (phenothiazines, tricyclics or tetracyclic antidepressants, erythromycin, H-1 antihistamines, cisapride, fluoroquinolones, azithromycin). Updated information on QT prolonging agents is available to be searched on the following database:QT prolonging agents  Azithromycin (last given 7/26): known risk -discontinue and start doxycycline  Tizanidine (last taken 7/25): possible risk.  -ordered PRN. Will stop. Consider an alternative as outpatient   []   [x]   Patient received a dose of hydrochlorothiazide (Oretic) alone or in any combination including triamterene (Dyazide, Maxzide) in the last 48 hours.    []   [x]  Patient received a medication known to increase dofetilide plasma  concentrations prior to initial dofetilide dose:  . Trimethoprim (Primsol, Proloprim) in the last 36 hours . Verapamil (Calan, Verelan) in the last 36 hours or a sustained release dose in the last 72 hours . Megestrol (Megace) in the last 5 days  . Cimetidine (Tagamet) in the last 6 hours . Ketoconazole (Nizoral) in the last 24 hours . Itraconazole (Sporanox) in the last 48 hours  . Prochlorperazine (Compazine) in the last 36 hours     []   [x]   Patient is known to have a history of torsades de pointes; congenital or acquired long QT syndromes.    []   [x]   Patient has received a Class 1 antiarrhythmic with less than 2 half-lives since last dose. (Disopyramide, Quinidine, Procainamide, Lidocaine, Mexiletine, Flecainide, Propafenone)    []   [x]   Patient has received amiodarone therapy in the past 3 months or amiodarone level is greater than 0.3 ng/ml.    Patient has been appropriately anticoagulated with apixaban.  Labs:    Component Value Date/Time   K 4.2 03/07/2020 0434   MG 1.9 03/06/2020 1342     Plan: -Potassium: K >/= 4: Appropriate to initiate Tikosyn, no replacement needed    -Magnesium: Mg 1.8-2: Give Mg 2 gm IV x1 to prevent Mg from dropping below 1.8 - do not need to recheck Mg. Appropriate to initiate Tikosyn  -Start Tikosyn 569mcg po q12h (Spoke w/ Dr. Nadyne Coombes)   Thank you for allowing pharmacy to participate in this patient's care    Hildred Laser, PharmD Clinical Pharmacist **Pharmacist phone directory can now be found on [email or web address] ([email or web address]).  Listed under [place].

## 2020-03-08 NOTE — Consult Note (Signed)
CARDIOLOGY CONSULT NOTE  Patient ID: Dustin Schneider MRN: [id number] DOB/AGE: 1956-07-30 64 y.o.  Admit date: 03/06/2020 Referring Physician  Shelly Coss, MD Primary Physician:  Kerin Perna, NP Reason for Consultation  A. Fib  Patient ID: Dustin Schneider, male    DOB: March 24, 1956, 65 y.o.   MRN: [id number]   CC: A. Fib.  HPI:    Dustin Schneider  is a 64 y.o. Caucasian male with history of hypertension, hyperglycemia, hyperlipidemia, paroxysmal atrial fibrillation and in 2019 found to have mild LV systolic dysfunction but EF improved on cardioversion and maintaining sinus rhythm, patient was previously on Xarelto and also Tikosyn.  Patient was admitted on 01/27/2020 with acute stroke with right hemiplegia, aspiration pneumonia and dysphagia after the stroke.  He was discharged home after rehab on 02/02/2020 but now again admitted on 03/06/2020 with aspiration pneumonia.  I was consulted for management of A. fib with RVR.  Patient is nonverbal but does recognize me when I walk in.  States that he is doing well.  Still has cough but denies chest pain, palpitations, dyspnea.  No fever or chills.  Past Medical History:  Diagnosis Date  . Hypertension   . NICM (nonischemic cardiomyopathy) ([place]) 06/15/2018   04/23/18: EF 45%, 09/15/18: NOrmal LVEF  . Paroxysmal atrial fibrillation ([place]) 06/15/2018  . Stroke ([place])   . Visit for monitoring Tikosyn therapy 06/12/2018   Past Surgical History:  Procedure Laterality Date  . CARDIOVERSION N/A 05/13/2018   Procedure: CARDIOVERSION;  Surgeon: [name], MD;  Location: [facility] ENDOSCOPY;  Service: Cardiovascular;  Laterality: N/A;  . CARDIOVERSION N/A 06/13/2018   Procedure: CARDIOVERSION;  Surgeon: [name], MD;  Location: [facility] ENDOSCOPY;  Service: Cardiovascular;  Laterality: N/A;  . CARDIOVERSION N/A 06/23/2018   Procedure: CARDIOVERSION;  Surgeon: [name], MD;  Location: [place];  Service: Cardiovascular;  Laterality: N/A;  .  excision of actinic keratosis    . EYE SURGERY     eye lift  . IR CT HEAD LTD  01/27/2020  . IR PERCUTANEOUS ART THROMBECTOMY/INFUSION INTRACRANIAL INC DIAG ANGIO  01/27/2020      . IR PERCUTANEOUS ART THROMBECTOMY/INFUSION INTRACRANIAL INC DIAG ANGIO  01/27/2020  . RADIOLOGY WITH ANESTHESIA N/A 01/27/2020   Procedure: IR WITH ANESTHESIA;  Surgeon: Radiologist, Medication, MD;  Location: [place];  Service: Radiology;  Laterality: N/A;  . TONSILLECTOMY    . WISDOM TOOTH EXTRACTION     Social History   Tobacco Use  . Smoking status: Never Smoker  . Smokeless tobacco: Never Used  Substance Use Topics  . Alcohol use: Yes    Comment: very little  Marital Status: Divorced   ROS  Review of Systems  Unable to perform ROS: patient nonverbal   Objective   Vitals with BMI 03/08/2020 03/08/2020 03/08/2020  Height - - -  Weight - - -  BMI - - -  Systolic 470 962 836  Diastolic 71 79 77  Pulse 97 57 97    Blood pressure 116/71, pulse 97, temperature 99.8 F (37.7 C), temperature source Oral, resp. rate 18, height 5\' 9"  (1.753 m), weight 77.2 kg, SpO2 95 %.  Vitals with BMI 03/08/2020 03/08/2020 03/08/2020  Height - - -  Weight - - -  BMI - - -  Systolic 629 476 546  Diastolic 96 71 79  Pulse 503 97 57     Physical Exam Constitutional:      Appearance: He is normal weight.  HENT:  Head: Atraumatic.  Cardiovascular:     Rate and Rhythm: Tachycardia present. Rhythm irregular.     Pulses: Normal pulses and intact distal pulses.     Heart sounds: No murmur heard.  No gallop. No S3 or S4 sounds.      Comments: S1 is variable, S2 is normal. No JVD, No leg edema. Pulmonary:     Effort: Pulmonary effort is normal.     Breath sounds: Normal breath sounds.  Abdominal:     General: Bowel sounds are normal.     Palpations: Abdomen is soft.  Neurological:     Mental Status: He is alert and oriented to person, place, and time.     Comments: Right hemiplegia present and aphasia present,  but still patient able to comprehend questions    Laboratory examination:    Recent Labs    02/29/20 0713 03/06/20 0733 03/07/20 0434  NA 142 138 138  K 4.1 4.3 4.2  CL 106 99 106  CO2 27 26 24   GLUCOSE 115* 136* 131*  BUN 17 10 9   CREATININE 0.91 1.04 1.02  CALCIUM 8.9 9.0 8.3*  GFRNONAA >60 >60 >60  GFRAA >60 >60 >60   estimated creatinine clearance is 72.2 mL/min (by C-G formula based on SCr of 1.02 mg/dL).  CMP Latest Ref Rng & Units 03/07/2020 03/06/2020 02/29/2020  Glucose 70 - 99 mg/dL 131(H) 136(H) 115(H)  BUN 8 - 23 mg/dL 9 10 17   Creatinine 0.61 - 1.24 mg/dL 1.02 1.04 0.91  Sodium 135 - 145 mmol/L 138 138 142  Potassium 3.5 - 5.1 mmol/L 4.2 4.3 4.1  Chloride 98 - 111 mmol/L 106 99 106  CO2 22 - 32 mmol/L 24 26 27   Calcium 8.9 - 10.3 mg/dL 8.3(L) 9.0 8.9  Total Protein 6.5 - 8.1 g/dL - 6.4(L) -  Total Bilirubin 0.3 - 1.2 mg/dL - 0.9 -  Alkaline Phos 38 - 126 U/L - 75 -  AST 15 - 41 U/L - 22 -  ALT 0 - 44 U/L - 31 -   CBC Latest Ref Rng & Units 03/07/2020 03/06/2020 02/29/2020  WBC 4.0 - 10.5 K/uL 9.2 12.4(H) 6.5  Hemoglobin 13.0 - 17.0 g/dL 11.3(L) 13.2 13.7  Hematocrit 39 - 52 % 34.8(L) 40.6 42.4  Platelets 150 - 400 K/uL 180 201 184   Lipid Panel Recent Labs    03/24/19 0838 12/17/19 0831 01/28/20 0716  CHOL 225* 226* 125  TRIG 229* 180* 298*  LDLCALC 134* 154* 33  VLDL  --   --  60*  HDL 45 39* 32*  CHOLHDL  --   --  3.9    HEMOGLOBIN A1C Lab Results  Component Value Date   HGBA1C 5.6 01/28/2020   MPG 114 01/28/2020   TSH Recent Labs    12/17/19 0836  TSH 3.160   BNP (last 3 results) No results for input(s): BNP in the last 8760 hours.  Medications and allergies  No Known Allergies   Current Outpatient Medications  Medication Instructions  . acetaminophen (TYLENOL) 325-650 mg, Oral, Every 4 hours PRN  . Amino Acids-Protein Hydrolys (FEEDING SUPPLEMENT, PRO-STAT SUGAR FREE 64,) LIQD 30 mLs, Oral, 2 times daily  . apixaban (ELIQUIS) 5  mg, Oral, 2 times daily  . bethanechol (URECHOLINE) 25 mg, Oral, 3 times daily  . metoprolol tartrate (LOPRESSOR) 25 mg, Oral, 2 times daily  . rosuvastatin (CRESTOR) 10 mg, Oral, Daily after supper  . senna-docusate (SENOKOT-S) 8.6-50 MG tablet 1 tablet, Oral, At bedtime  PRN  . tamsulosin (FLOMAX) 0.4 mg, Oral, Daily  . tiZANidine (ZANAFLEX) 2 mg, Oral, 3 times daily    Scheduled Meds: . apixaban  5 mg Oral BID  . bethanechol  25 mg Oral TID  . guaiFENesin  600 mg Oral BID  . metoprolol tartrate  25 mg Oral BID  . rosuvastatin  10 mg Oral QPC supper  . sodium chloride flush  3 mL Intravenous Q12H  . tamsulosin  0.4 mg Oral Q2000   Continuous Infusions: . sodium chloride    . sodium chloride    . azithromycin 500 mg (03/07/20 2244)  . ceFEPime (MAXIPIME) IV 2 g (03/08/20 1448)  . diltiazem (CARDIZEM) infusion 10 mg/hr (03/08/20 1739)   PRN Meds:.sodium chloride, sodium chloride, acetaminophen, senna-docusate, sodium chloride flush, tiZANidine   I/O last 3 completed shifts: In: 1610.6 [P.O.:600; I.V.:382; IV Piggyback:628.6] Out: 500 [Urine:500] No intake/output data recorded.    Radiology:   Chest x-ray 03/06/2020: Right midlung airspace consolidation concerning for pneumonia. Followup PA and lateral chest X-ray is recommended in 3-4 weeks following trial of antibiotic therapy to ensure resolution and exclude underlying malignancy.  Cardiac Studies:   Nuclear stress test 05/05/2018: 1. The resting electrocardiogram demonstrated atrial fibrillation. The stress electrocardiogram was positive for ischemia with 2 mm downsloping ST depression in the inferior and lateral leads, persisted for 2 mintues into recovery. Stress symptoms included palpitations and fatigue. Patient exercised on Bruce protocol for 6:00 minutes and achieved 7.05 METS. Stress test terminated due to fatigue and 113% MPHR achieved (Target HR >85%). 2. Left ventricular cavity is noted to be normal on the  rest and stress studies. SPECT images demonstrate homogeneous tracer distribution throughout the myocardium. The left ventricular ejection fraction was calculated to be 38%, but visually appears to be at least low normal without wall motion abnormality. This is an intermediate risk study due to abnormal ST change and low LVEF (difficult in view of gating with A. Fib), clinical correlation recommended.  Echocardiogram 01/28/2020:  1. The left ventricle has normal function. The left ventricle has no regional wall motion abnormalities. Left ventricular diastolic parameters are  consistent with Grade II diastolic dysfunction (pseudonormalization).  2. Right ventricular systolic function is normal. The right ventricular size is normal. There is mildly elevated pulmonary artery systolic pressure.  3. Left atrial size was moderately dilated.  4. Right atrial size was moderately dilated.  5. The mitral valve is normal in structure. Mild mitral valve regurgitation.  6. The aortic valve is normal in structure. Aortic valve regurgitation is mild to moderate.  7. The inferior vena cava is dilated in size with >50% respiratory variability, suggesting right atrial pressure of 8 mmHg.  EKG:  EKG 03/06/2020: Atrial fibrillation with rapid ventricular response at the rate of 100 bpm, normal axis, nonspecific T abnormality.  Normal QT interval.  Assessment   RAIQUAN CHANDLER  is a 64 y.o. Caucasian male with history of hypertension, hyperglycemia, hyperlipidemia, paroxysmal atrial fibrillation and in 2019 found to have mild LV systolic dysfunction but EF improved on cardioversion and maintaining sinus rhythm, patient was previously on Xarelto and also Tikosyn.  Patient was admitted on 01/27/2020 with acute stroke with right hemiplegia, aspiration pneumonia and dysphagia after the stroke.  He was discharged home after rehab on 02/02/2020 but now again admitted on 03/06/2020 with aspiration pneumonia.  I was  consulted for management of A. fib with RVR.  1.  Paroxysmal atrial fibrillation CHA2DS2-VASc Score is 4.  Yearly risk of  stroke: 4% (A, HTN, CVA).  Score of 1=0.6; 2=2.2; 3=3.2; 4=4.8; 5=7.2; 6=9.8; 7=>9.8) -(CHF; HTN; vasc disease DM,  Male = 1; Age <65 =0; 65-74 = 1,  >75 =2; stroke/embolism= 2).   2.  Essential hypertension 3.  Hypercholesterolemia 4.  CVA with residual right-sided hemiplegia  Recommendations:   Patient is appropriately treated with anticoagulation with Eliquis.  Heart rate has been anywhere from 70bpm to 120 bpm, he is in persistent atrial fibrillation.  He was previously maintaining sinus rhythm on Tikosyn which was discontinued due to dysphagia in the past.  I will restart Tikosyn.  Zithromax and tizanidine will be discontinued as per pharmacy, discussed with pharmacy.  Continue anticoagulation.  Blood pressure is well controlled, lipids are under excellent control.  If he does not convert to sinus rhythm, and consider direct-current cardioversion on Thursday or Friday.   [name], MD, [facility] 03/08/2020, 7:03 PM Office: [phone]

## 2020-03-09 ENCOUNTER — Ambulatory Visit (HOSPITAL_COMMUNITY): Admit: 2020-03-09 | Payer: BC Managed Care – PPO | Admitting: Cardiology

## 2020-03-09 LAB — CBC WITH DIFFERENTIAL/PLATELET
Abs Immature Granulocytes: 0.03 10*3/uL (ref 0.00–0.07)
Basophils Absolute: 0 10*3/uL (ref 0.0–0.1)
Basophils Relative: 1 %
Eosinophils Absolute: 0.1 10*3/uL (ref 0.0–0.5)
Eosinophils Relative: 2 %
HCT: 34.7 % — ABNORMAL LOW (ref 39.0–52.0)
Hemoglobin: 11.3 g/dL — ABNORMAL LOW (ref 13.0–17.0)
Immature Granulocytes: 1 %
Lymphocytes Relative: 15 %
Lymphs Abs: 0.9 10*3/uL (ref 0.7–4.0)
MCH: 30.8 pg (ref 26.0–34.0)
MCHC: 32.6 g/dL (ref 30.0–36.0)
MCV: 94.6 fL (ref 80.0–100.0)
Monocytes Absolute: 0.8 10*3/uL (ref 0.1–1.0)
Monocytes Relative: 14 %
Neutro Abs: 3.8 10*3/uL (ref 1.7–7.7)
Neutrophils Relative %: 67 %
Platelets: 234 10*3/uL (ref 150–400)
RBC: 3.67 MIL/uL — ABNORMAL LOW (ref 4.22–5.81)
RDW: 13.8 % (ref 11.5–15.5)
WBC: 5.7 10*3/uL (ref 4.0–10.5)
nRBC: 0 % (ref 0.0–0.2)

## 2020-03-09 LAB — LEGIONELLA PNEUMOPHILA SEROGP 1 UR AG: L. pneumophila Serogp 1 Ur Ag: POSITIVE — AB

## 2020-03-09 LAB — BASIC METABOLIC PANEL
Anion gap: 11 (ref 5–15)
BUN: 10 mg/dL (ref 8–23)
CO2: 25 mmol/L (ref 22–32)
Calcium: 8.3 mg/dL — ABNORMAL LOW (ref 8.9–10.3)
Chloride: 102 mmol/L (ref 98–111)
Creatinine, Ser: 0.79 mg/dL (ref 0.61–1.24)
GFR calc Af Amer: >60 mL/min (ref >60)
GFR calc non Af Amer: >60 mL/min (ref >60)
Glucose, Bld: 116 mg/dL — ABNORMAL HIGH (ref 70–99)
Potassium: 3.8 mmol/L (ref 3.5–5.1)
Sodium: 138 mmol/L (ref 135–145)

## 2020-03-09 LAB — MAGNESIUM: Magnesium: 2.3 mg/dL (ref 1.7–2.4)

## 2020-03-09 MED ORDER — METOPROLOL TARTRATE 50 MG PO TABS
50.0000 mg | ORAL_TABLET | Freq: Three times a day (TID) | ORAL | Status: DC
  Administered 2020-03-09 – 2020-03-11 (×6): 50 mg via ORAL
  Filled 2020-03-09 (×6): qty 1

## 2020-03-09 MED ORDER — POTASSIUM CHLORIDE CRYS ER 20 MEQ PO TBCR
40.0000 meq | EXTENDED_RELEASE_TABLET | Freq: Once | ORAL | Status: AC
  Administered 2020-03-09: 40 meq via ORAL
  Filled 2020-03-09: qty 2

## 2020-03-09 NOTE — Progress Notes (Addendum)
Pharmacy: Dofetilide (Tikosyn) - Follow Up Assessment and Electrolyte Replacement  Pharmacy consulted to assist in monitoring and replacing electrolytes in this 64 y.o. male admitted on 03/06/2020 undergoing dofetilide re- initiation. Of note, patient was on Tikosyn prior to June 2021 admission for CVA.  Pt remained off Tikosyn on discharge with plans for outpatient follow-up.  Pt readmitted 7/25 and Tikosyn restarted.  First dose: 7/27 PM.  Labs:    Component Value Date/Time   K 3.8 03/09/2020 0656   MG 2.3 03/09/2020 0656     Plan: Potassium: K 3.8-3.9:  Give KCl 40 mEq po x1   Magnesium: Mg > 2: No additional supplementation needed  Thank you for allowing pharmacy to participate in this patient's care   Dustin Schneider, PharmD PGY1 Acute Care Pharmacy Resident Phone: [phone] 03/09/2020 10:10 AM  Please check [email or web address] for unit specific pharmacy phone numbers.

## 2020-03-09 NOTE — Plan of Care (Signed)
  Problem: Education: Goal: Knowledge of disease or condition will improve Outcome: Progressing   

## 2020-03-09 NOTE — Telephone Encounter (Signed)
Sorry to hear 

## 2020-03-09 NOTE — Progress Notes (Signed)
Pharmacy Antibiotic Note  Dustin Schneider is a 64 y.o. male admitted on 03/06/2020 with pneumonia.  Pharmacy has been consulted for Cefepime dosing.  Height: 5\' 9"  (175.3 cm) Weight: 76.9 kg (169 lb 8.5 oz) IBW/kg (Calculated) : 70.7  Temp (24hrs), Avg:100.3 F (37.9 C), Min:97.8 F (36.6 C), Max:103.1 F (39.5 C)  Recent Labs  Lab 03/06/20 0733 03/06/20 1034 03/06/20 1342 03/07/20 0307 03/07/20 0434 03/09/20 0656  WBC 12.4*  --   --   --  9.2 5.7  CREATININE 1.04  --   --   --  1.02 0.79  LATICACIDVEN 1.5 2.6* 1.4 1.1  --   --     Estimated Creatinine Clearance: 92.1 mL/min (by C-G formula based on SCr of 0.79 mg/dL).    No Known Allergies  Assessment: Patient admitted for PNA/sepsis with CXR revealing right midlung opacity. LA down to 1.1, PCT 0.1, WBC down to WNL, Tmax/24h 103.1 F however patient is currently afebrile. Patient did not receive IV antibiotics at recent admit and concern for MDR is low. Legionella antigen is positive. Patient was transitioned from azithromycin to doxycycline due to risk of QTc prolongation with initiation of Tikosyn.  Cefepime 7/25 >> Vancomycin 7/25 >> 7/26 Azithromycin 7/26 >> 7/27 Doxycycline 7/27 >>   7/25 mrsa pcr - neg 7/25 bcx - ngtd 7/25 strep pneumo - neg 7/26 legionella ag - pos  Plan: Cefepime IV 2g every 8 hours Doxycycline IV 100mg  every 12 hours  Monitor clinical progress, C&S, renal function F/u de-escalation plan/LOT  Thank you for allowing pharmacy to be a part of this patient's care.  Romilda Garret, PharmD PGY1 Acute Care Pharmacy Resident Phone: [phone] 03/09/2020 10:02 AM  Please check [email or web address] for unit specific pharmacy phone numbers.

## 2020-03-09 NOTE — Progress Notes (Addendum)
Subjective:  No specific complaints.  Intake/Output from previous day:  I/O last 3 completed shifts: In: 1880.6 [P.O.:720; I.V.:382; IV Piggyback:778.6] Out: 1150 [Urine:1150] No intake/output data recorded.  Blood pressure 117/79, pulse 93, temperature 97.8 F (36.6 C), temperature source Oral, resp. rate 19, height '5\' 9"'  (1.753 m), weight 76.9 kg, SpO2 97 %. Physical Exam Constitutional:      General: He is not in acute distress.    Appearance: He is well-developed.  Cardiovascular:     Rate and Rhythm: Normal rate. Rhythm irregularly irregular.     Pulses: Intact distal pulses.     Heart sounds: No murmur heard.  No gallop.      Comments: S1 variable, S2 normal.   No JVD, No leg edema.   Pulmonary:     Effort: Pulmonary effort is normal. No accessory muscle usage or respiratory distress.     Breath sounds: Normal breath sounds.  Abdominal:     Palpations: Abdomen is soft.     Lab Results: BMP BNP (last 3 results) No results for input(s): BNP in the last 8760 hours.  ProBNP (last 3 results) No results for input(s): PROBNP in the last 8760 hours. BMP Latest Ref Rng & Units 03/09/2020 03/07/2020 03/06/2020  Glucose 70 - 99 mg/dL 116(H) 131(H) 136(H)  BUN 8 - 23 mg/dL '10 9 10  ' Creatinine 0.61 - 1.24 mg/dL 0.79 1.02 1.04  BUN/Creat Ratio 10 - 24 - - -  Sodium 135 - 145 mmol/L 138 138 138  Potassium 3.5 - 5.1 mmol/L 3.8 4.2 4.3  Chloride 98 - 111 mmol/L 102 106 99  CO2 22 - 32 mmol/L '25 24 26  ' Calcium 8.9 - 10.3 mg/dL 8.3(L) 8.3(L) 9.0   Hepatic Function Latest Ref Rng & Units 03/06/2020 02/03/2020 01/27/2020  Total Protein 6.5 - 8.1 g/dL 6.4(L) 6.0(L) 6.2(L)  Albumin 3.5 - 5.0 g/dL 2.9(L) 3.2(L) 4.1  AST 15 - 41 U/L 22 38 23  ALT 0 - 44 U/L 31 85(H) 37  Alk Phosphatase 38 - 126 U/L 75 73 45  Total Bilirubin 0.3 - 1.2 mg/dL 0.9 0.8 1.0   CBC Latest Ref Rng & Units 03/09/2020 03/07/2020 03/06/2020  WBC 4.0 - 10.5 K/uL 5.7 9.2 12.4(H)  Hemoglobin 13.0 - 17.0 g/dL 11.3(L)  11.3(L) 13.2  Hematocrit 39 - 52 % 34.7(L) 34.8(L) 40.6  Platelets 150 - 400 K/uL 234 180 201   Lipid Panel     Component Value Date/Time   CHOL 125 01/28/2020 0716   CHOL 226 (H) 12/17/2019 0831   TRIG 298 (H) 01/28/2020 0716   HDL 32 (L) 01/28/2020 0716   HDL 39 (L) 12/17/2019 0831   CHOLHDL 3.9 01/28/2020 0716   VLDL 60 (H) 01/28/2020 0716   LDLCALC 33 01/28/2020 0716   LDLCALC 154 (H) 12/17/2019 0831   Cardiac Panel (last 3 results) No results for input(s): CKTOTAL, CKMB, TROPONINI, RELINDX in the last 72 hours.  HEMOGLOBIN A1C Lab Results  Component Value Date   HGBA1C 5.6 01/28/2020   MPG 114 01/28/2020   TSH Recent Labs    12/17/19 0836  TSH 3.160   Imaging: Chest x-ray 03/06/2020: Right midlung airspace consolidation concerning for pneumonia. Followup PA and lateral chest X-ray is recommended in 3-4 weeks following trial of antibiotic therapy to ensure resolution and exclude underlying malignancy.  Cardiac Studies:  Nuclear stress test 05/05/2018: 1. The resting electrocardiogram demonstrated atrial fibrillation. The stress electrocardiogram was positive for ischemia with 2 mm downsloping ST depression in the  inferior and lateral leads, persisted for 2 mintues into recovery. Stress symptoms included palpitations and fatigue. Patient exercised on Bruce protocol for 6:00 minutes and achieved 7.05 METS. Stress test terminated due to fatigue and 113% MPHR achieved (Target HR >85%). 2. Left ventricular cavity is noted to be normal on the rest and stress studies. SPECT images demonstrate homogeneous tracer distribution throughout the myocardium. The left ventricular ejection fraction was calculated to be 38%, but visually appears to be at least low normal without wall motion abnormality. This is an intermediate risk study due to abnormal ST change and low LVEF (difficult in view of gating with A. Fib), clinical correlation recommended.  Echocardiogram 01/28/2020:  1.  The left ventricle has normal function. The left ventricle has no regional wall motion abnormalities. Left ventricular diastolic parameters are  consistent with Grade II diastolic dysfunction (pseudonormalization).  2. Right ventricular systolic function is normal. The right ventricular size is normal. There is mildly elevated pulmonary artery systolic pressure.  3. Left atrial size was moderately dilated.  4. Right atrial size was moderately dilated.  5. The mitral valve is normal in structure. Mild mitral valve regurgitation.  6. The aortic valve is normal in structure. Aortic valve regurgitation is mild to moderate.  7. The inferior vena cava is dilated in size with >50% respiratory variability, suggesting right atrial pressure of 8 mmHg.  EKG:  EKG 03/09/2020: Atrial flutter, atypical with controlled ventricular response at the rate of 98 bpm, single PVC, nonspecific ST-T abnormality, normal QT interval.  EKG 03/06/2020: Atrial fibrillation with rapid ventricular response at the rate of 100 bpm, normal axis, nonspecific T abnormality.  Normal QT interval.  Scheduled Meds: . apixaban  5 mg Oral BID  . bethanechol  25 mg Oral TID  . dofetilide  500 mcg Oral BID  . guaiFENesin  600 mg Oral BID  . metoprolol tartrate  25 mg Oral BID  . rosuvastatin  10 mg Oral QPC supper  . sodium chloride flush  3 mL Intravenous Q12H  . tamsulosin  0.4 mg Oral Q2000   Continuous Infusions: . sodium chloride    . sodium chloride    . ceFEPime (MAXIPIME) IV 2 g (03/09/20 7129)  . diltiazem (CARDIZEM) infusion 10 mg/hr (03/08/20 1739)  . doxycycline (VIBRAMYCIN) IV 100 mg (03/09/20 0002)   PRN Meds:.sodium chloride, sodium chloride, acetaminophen, senna-docusate, sodium chloride flush  Assessment/Plan:  1.  Paroxysmal atrial fibrillation CHA2DS2-VASc Score is 4.  Yearly risk of stroke: 4% (A, HTN, CVA).  Score of 1=0.6; 2=2.2; 3=3.2; 4=4.8; 5=7.2; 6=9.8; 7=>9.8) -(CHF; HTN; vasc disease DM,   Male = 1; Age <65 =0; 65-74 = 1,  >75 =2; stroke/embolism= 2).   2.  Essential hypertension  Recommendation: Patient is tolerating dofetilide.  We will plan on performing cardioversion Friday morning if still in A. fib.  Blood pressure is well controlled.  We will continue to follow sidelines until stable from medication management standpoint.  He is now move his right arm. His significant other (girlfriend) is present and all questions answered. WBC count decreasing.   [name], M.D. 03/09/2020, 8:13 AM [facility] Pager: [phone] Office: [phone] If no answer: [phone]

## 2020-03-09 NOTE — Telephone Encounter (Signed)
Readmitted to [facility] for pneumonia per Mr. McWilliams. Appt will be cancelled.

## 2020-03-09 NOTE — Progress Notes (Signed)
[name] heart rate slowly went up after stopping cardizem drip and sustaining between 130-170's in the evening. Asymptomatic.  Diastolic BP elevated. Notified Dr. [name]. Metoprolol dose changed to 50mg  TID.

## 2020-03-09 NOTE — Progress Notes (Signed)
PROGRESS NOTE    Dustin Schneider  QVZ:[id number] DOB: Apr 11, 1956 DOA: 03/06/2020 PCP: Kerin Perna, NP   Brief Narrative:  Patient is a 28-year male with history of hypertension, hyperlipidemia, nonischemic cardiomyopathy, paroxysmal A. fib on Eliquis, BPH, recent CVA with residual right-sided hemiparesis and expressive aphasia who presents with 2-day history of fever.  He was just discharged from rehab to home.  On presentation he was febrile, tachycardic, hypertensive.  He had mild elevated WBC counts.He also went into Afib with RVR.  Chest x-ray was concerning for right midlung opacity.  Patient was admitted for the management of healthcare associated  pneumonia.  Hospital course remarkable for febrile episodes.  Continue on broad-spectrum antibiotics.  PT evaluated him and recommended SNF on discharge. Plan for cardioversion on Friday.  Assessment & Plan:   Active Problems:   Paroxysmal atrial fibrillation (HCC)   Chronic anticoagulation   Sepsis due to pneumonia ([place])   History of stroke with residual deficit   Transient hypotension   Sepsis secondary to HCAP: Presented with fever, tachycardia, tachypnea, hypotension.  Started on broad-spectrum antibiotics.  Follow-up blood cultures, sputum culture,NGTD  Patient was febrile yesterday afternoon and evening.  Afebrile today.Aspiration pneumonia is also a possibility.  Speech therapy evaluation done.  Currently hemodynamically stable.  Healthcare associated pneumonia: Patient recently discharged from rehab.  Started on regimen for HCAP. MRSA PCR negative so discontinued vancomycin. Continue current antibiotics.  Currently respiratory status stable.  On room air.  Paroxysmal A. fib: Was in A. fib with RVR on presentation.  On Tikosyn previously,now resumed.  He follows with Dr. [name].  Cardiology is following here.  Also on metoprolol.  Heart rate well controlled this morning.  Plan for possible cardioversion in Friday if A. fib  persists.  Recent CVA with right-sided residual hemiparesis and expressive aphasia: Had a stroke on 6/21 and is status post clot removal of the left MCA/ACA.  Continue statin and Eliquis.  Chronic diastolic CHF: Currently euvolemic.  Last echocardiogram showed ejection fraction of 55 to 32%, grade 2 diastolic dysfunction.  BPH: On tamsulosin  Hyperlipidemia: On Crestor           DVT prophylaxis:Eliquis Code Status: Full Family Communication: Partner present at the bedside  status is: Inpatient  Remains inpatient appropriate because:Needs IV antibiotics   Dispo: The patient is from: Home              Anticipated d/c is to: SNF vs home              Anticipated d/c date is: 2-3days              Patient currently is not medically stable to d/c.      Consultants: Cardiology  Procedures:None  Antimicrobials:  Anti-infectives (From admission, onward)   Start     Dose/Rate Route Frequency Ordered Stop   03/08/20 2200  doxycycline (VIBRAMYCIN) 100 mg in sodium chloride 0.9 % 250 mL IVPB     Discontinue     100 mg 125 mL/hr over 120 Minutes Intravenous Every 12 hours 03/08/20 1927     03/07/20 2230  azithromycin (ZITHROMAX) 500 mg in sodium chloride 0.9 % 250 mL IVPB  Status:  Discontinued        500 mg 250 mL/hr over 60 Minutes Intravenous Every 24 hours 03/07/20 2200 03/08/20 1927   03/07/20 0100  vancomycin (VANCOCIN) IVPB 1000 mg/200 mL premix  Status:  Discontinued        1,000 mg 200  mL/hr over 60 Minutes Intravenous Every 12 hours 03/06/20 1129 03/07/20 1442   03/06/20 2000  ceFEPIme (MAXIPIME) 2 g in sodium chloride 0.9 % 100 mL IVPB     Discontinue     2 g 200 mL/hr over 30 Minutes Intravenous Every 8 hours 03/06/20 1129     03/06/20 1200  vancomycin (VANCOREADY) IVPB 750 mg/150 mL        750 mg 150 mL/hr over 60 Minutes Intravenous  Once 03/06/20 1122 03/06/20 1343   03/06/20 1030  vancomycin (VANCOCIN) IVPB 1000 mg/200 mL premix        1,000 mg 200 mL/hr over  60 Minutes Intravenous  Once 03/06/20 1029 03/06/20 1211   03/06/20 1030  ceFEPIme (MAXIPIME) 2 g in sodium chloride 0.9 % 100 mL IVPB        2 g 200 mL/hr over 30 Minutes Intravenous  Once 03/06/20 1029 03/06/20 1146      Subjective:  Patient seen and examined at the bedside this morning.  Hemodynamically stable.  Afebrile today.  Denies any new complaints.  Respiratory status is stable.  Heart rate well controlled.   Objective: Vitals:   03/08/20 2300 03/08/20 2320 03/09/20 0131 03/09/20 0507  BP:  116/78 117/83 (!) 130/85  Pulse:  92 70 64  Resp:  20 19 19   Temp: (!) 102.9 F (39.4 C) (!) 102.4 F (39.1 C) 99.1 F (37.3 C) 98.6 F (37 C)  TempSrc:  Oral Oral Oral  SpO2:  94% 93% 96%  Weight:    76.9 kg  Height:        Intake/Output Summary (Last 24 hours) at 03/09/2020 0801 Last data filed at 03/09/2020 0507 Gross per 24 hour  Intake 1122.48 ml  Output 1150 ml  Net -27.52 ml   Filed Weights   03/06/20 0720 03/08/20 0506 03/09/20 0507  Weight: 77.1 kg 77.2 kg 76.9 kg    Examination:   General exam: Deconditioned, debilitated chronically looking male Respiratory system: No crackles or wheezes  cardiovascular system: Irregularly irregular rhythm. No JVD, murmurs, rubs, gallops or clicks. Gastrointestinal system: Abdomen is nondistended, soft and nontender. No organomegaly or masses felt. Normal bowel sounds heard. Central nervous system: Alert and oriented.  Aphasia, right hemiplegia Extremities: No edema, no clubbing ,no cyanosis  Data Reviewed: I have personally reviewed following labs and imaging studies  CBC: Recent Labs  Lab 03/06/20 0733 03/07/20 0434 03/09/20 0656  WBC 12.4* 9.2 5.7  NEUTROABS 10.5*  --  3.8  HGB 13.2 11.3* 11.3*  HCT 40.6 34.8* 34.7*  MCV 97.6 96.9 94.6  PLT 201 180 756   Basic Metabolic Panel: Recent Labs  Lab 03/06/20 0733 03/06/20 1342 03/07/20 0434 03/09/20 0656  NA 138  --  138 138  K 4.3  --  4.2 3.8  CL 99  --   106 102  CO2 26  --  24 25  GLUCOSE 136*  --  131* 116*  BUN 10  --  9 10  CREATININE 1.04  --  1.02 0.79  CALCIUM 9.0  --  8.3* 8.3*  MG  --  1.9  --  2.3   GFR: Estimated Creatinine Clearance: 92.1 mL/min (by C-G formula based on SCr of 0.79 mg/dL). Liver Function Tests: Recent Labs  Lab 03/06/20 0733  AST 22  ALT 31  ALKPHOS 75  BILITOT 0.9  PROT 6.4*  ALBUMIN 2.9*   No results for input(s): LIPASE, AMYLASE in the last 168 hours. No results for input(s):  AMMONIA in the last 168 hours. Coagulation Profile: No results for input(s): INR, PROTIME in the last 168 hours. Cardiac Enzymes: No results for input(s): CKTOTAL, CKMB, CKMBINDEX, TROPONINI in the last 168 hours. BNP (last 3 results) No results for input(s): PROBNP in the last 8760 hours. HbA1C: No results for input(s): HGBA1C in the last 72 hours. CBG: No results for input(s): GLUCAP in the last 168 hours. Lipid Profile: No results for input(s): CHOL, HDL, LDLCALC, TRIG, CHOLHDL, LDLDIRECT in the last 72 hours. Thyroid Function Tests: No results for input(s): TSH, T4TOTAL, FREET4, T3FREE, THYROIDAB in the last 72 hours. Anemia Panel: No results for input(s): VITAMINB12, FOLATE, FERRITIN, TIBC, IRON, RETICCTPCT in the last 72 hours. Sepsis Labs: Recent Labs  Lab 03/06/20 0733 03/06/20 1034 03/06/20 1342 03/07/20 0307  PROCALCITON  --   --  0.10  --   LATICACIDVEN 1.5 2.6* 1.4 1.1    Recent Results (from the past 240 hour(s))  SARS Coronavirus 2 by RT PCR (hospital order, performed in [facility] hospital lab) Nasopharyngeal Nasopharyngeal Swab     Status: None   Collection Time: 03/06/20  7:40 AM   Specimen: Nasopharyngeal Swab  Result Value Ref Range Status   SARS Coronavirus 2 NEGATIVE NEGATIVE Final    Comment: (NOTE) SARS-CoV-2 target nucleic acids are NOT DETECTED.  The SARS-CoV-2 RNA is generally detectable in upper and lower respiratory specimens during the acute phase of infection. The  lowest concentration of SARS-CoV-2 viral copies this assay can detect is 250 copies / mL. A negative result does not preclude SARS-CoV-2 infection and should not be used as the sole basis for treatment or other patient management decisions.  A negative result may occur with improper specimen collection / handling, submission of specimen other than nasopharyngeal swab, presence of viral mutation(s) within the areas targeted by this assay, and inadequate number of viral copies (<250 copies / mL). A negative result must be combined with clinical observations, patient history, and epidemiological information.  Fact Sheet for Patients:   StrictlyIdeas.no  Fact Sheet for Healthcare Providers: BankingDealers.co.za  This test is not yet approved or  cleared by the [place] FDA and has been authorized for detection and/or diagnosis of SARS-CoV-2 by FDA under an Emergency Use Authorization (EUA).  This EUA will remain in effect (meaning this test can be used) for the duration of the COVID-19 declaration under Section 564(b)(1) of the Act, 21 U.S.C. section 360bbb-3(b)(1), unless the authorization is terminated or revoked sooner.  Performed at [facility], [place] [address] [place], [place] [address]   Blood culture (routine x 2)     Status: None (Preliminary result)   Collection Time: 03/06/20 10:26 AM   Specimen: BLOOD  Result Value Ref Range Status   Specimen Description BLOOD RIGHT ANTECUBITAL  Final   Special Requests   Final    BOTTLES DRAWN AEROBIC AND ANAEROBIC Blood Culture adequate volume   Culture   Final    NO GROWTH 2 DAYS Performed at [facility], [place] [address], [place], [place] [address]    Report Status PENDING  Incomplete  Blood culture (routine x 2)     Status: None (Preliminary result)   Collection Time: 03/06/20 10:26 AM   Specimen: BLOOD LEFT WRIST  Result Value Ref Range Status   Specimen  Description BLOOD LEFT WRIST  Final   Special Requests   Final    BOTTLES DRAWN AEROBIC AND ANAEROBIC Blood Culture adequate volume   Culture  Final    NO GROWTH 2 DAYS Performed at [facility], [place] [address] [place], [place] [address]    Report Status PENDING  Incomplete  MRSA PCR Screening     Status: None   Collection Time: 03/06/20 11:55 AM   Specimen: Nasal Mucosa; Nasopharyngeal  Result Value Ref Range Status   MRSA by PCR NEGATIVE NEGATIVE Final    Comment:        The GeneXpert MRSA Assay (FDA approved for NASAL specimens only), is one component of a comprehensive MRSA colonization surveillance program. It is not intended to diagnose MRSA infection nor to guide or monitor treatment for MRSA infections. Performed at [facility], [place] [address], [place], [place] [address]          Radiology Studies: No results found.      Scheduled Meds: . apixaban  5 mg Oral BID  . bethanechol  25 mg Oral TID  . dofetilide  500 mcg Oral BID  . guaiFENesin  600 mg Oral BID  . metoprolol tartrate  25 mg Oral BID  . rosuvastatin  10 mg Oral QPC supper  . sodium chloride flush  3 mL Intravenous Q12H  . tamsulosin  0.4 mg Oral Q2000   Continuous Infusions: . sodium chloride    . sodium chloride    . ceFEPime (MAXIPIME) IV 2 g (03/09/20 7614)  . diltiazem (CARDIZEM) infusion 10 mg/hr (03/08/20 1739)  . doxycycline (VIBRAMYCIN) IV 100 mg (03/09/20 0002)     LOS: 3 days    Time spent: 25 mins.More than 50% of that time was spent in counseling and/or coordination of care.      Shelly Coss, MD [facility] P7/28/2021, 8:01 AM

## 2020-03-09 NOTE — TOC Progression Note (Signed)
Transition of Care [facility]) - Progression Note    Patient Details  Name: BILL YOHN MRN: [id number] Date of Birth: 1956/06/06  Transition of [facility]) CM/SW [place], [place] Phone Number: 03/09/2020, 3:42 PM  Clinical Narrative:     Spoke with patient's brother, [name], provided bed offers. He will review and inform CSW of choice.   Thurmond Butts, MSW, Portal Clinical Social Worker   Expected Discharge Plan: Skilled Nursing Facility Barriers to Discharge: Ship broker, Continued Medical Work up  Expected Discharge Plan and Services Expected Discharge Plan: [place]       Living arrangements for the past 2 months: Single Family Home                                       Social Determinants of Health (SDOH) Interventions    Readmission Risk Interventions No flowsheet data found.

## 2020-03-09 NOTE — Evaluation (Addendum)
Occupational Therapy Evaluation Patient Details Name: Dustin Schneider MRN: [id number] DOB: [birth year] Today's Date: 03/09/2020    History of Present Illness 64 y.o. male who presents with a chief complaint of fever/weakness, dx sepsis secondary to HCAP. CXR shows right midlung consolidation concerning for pna.  PMHx includes recent left MCA CVA 6/21 with residual aphasia and right hemiplegia. Discharged home from [facility] 03/02/20.   Clinical Impression   PTA, patient was requiring assistance with functional transfers and BADLs and was independent with wc mobility after MCA CVA 6/21. Patient currently presents near baseline level of function requiring supervision A for self-feeding, Min A for grooming seated EOB, Min A for UB dressing, and Mod A for LB dressing at bed level. Per girlfriend report, patient's wc does not fit into bathroom so bathing/dressing have occurred at bed level. Patient with new proximal return of function at shoulder joint demonstrating AROM ~75-85 degrees shoulder abduction and some active shoulder flexion with compensation. Patient also with some return to RLE. Patient would benefit from continued acute OT services to maximize safety and independence with self-care tasks in prep for d/c to next level of care with recommendation for outpatient OT follow-up and 24 hour supervision/assist from girlfriend and family.     Follow Up Recommendations  Outpatient OT;Supervision/Assistance - 24 hour    Equipment Recommendations  None recommended by OT    Recommendations for Other Services       Precautions / Restrictions Precautions Precautions: Fall;Other (comment) Precaution Comments: dense R hemiparesis, aphasia Restrictions Weight Bearing Restrictions: No      Mobility Bed Mobility Overal bed mobility: Needs Assistance Bed Mobility: Supine to Sit     Supine to sit: Mod assist;HOB elevated     General bed mobility comments: Increased time/effort and use of bedrail  with assist for advancement of RLE toward EOB.   Transfers Overall transfer level: Needs assistance Equipment used: None Transfers: Sit to/from Stand;Lateral/Scoot Transfers Sit to Stand: Mod assist        Lateral/Scoot Transfers: Min assist;From elevated surface General transfer comment: Mod A for STS from elevated EOB and Min A for sqat-pivot txf. to recliner on L.     Balance Overall balance assessment: Needs assistance Sitting-balance support: Feet supported;No upper extremity supported Sitting balance-Leahy Scale: Good     Standing balance support: Single extremity supported;During functional activity Standing balance-Leahy Scale: Poor Standing balance comment: reliant on external support                           ADL either performed or assessed with clinical judgement   ADL Overall ADL's : Needs assistance/impaired Eating/Feeding: Supervision/ safety   Grooming: Minimal assistance;Sitting   Upper Body Bathing: Minimal assistance;Sitting   Lower Body Bathing: Moderate assistance;Bed level   Upper Body Dressing : Minimal assistance;Sitting   Lower Body Dressing: Moderate assistance;Bed level   Toilet Transfer: Moderate assistance;Squat-pivot;Requires drop arm;BSC                   Vision Baseline Vision/History: Wears glasses Patient Visual Report: No change from baseline Vision Assessment?: No apparent visual deficits     Perception     Praxis      Pertinent Vitals/Pain Pain Assessment: No/denies pain     Hand Dominance Right   Extremity/Trunk Assessment Upper Extremity Assessment Upper Extremity Assessment: RUE deficits/detail RUE Deficits / Details: Patient with returned  RUE Sensation: decreased light touch;decreased proprioception RUE Coordination: decreased fine motor;decreased gross motor  Lower Extremity Assessment Lower Extremity Assessment: Defer to PT evaluation   Cervical / Trunk Assessment Cervical / Trunk  Assessment: Normal   Communication Communication Communication: Expressive difficulties;Receptive difficulties   Cognition Arousal/Alertness: Awake/alert Behavior During Therapy: Flat affect Overall Cognitive Status: Difficult to assess                                 General Comments: aphasic. Following simple commands. Decreased safety awareness.   General Comments  Patient's girlfriend present at bedside throughout evaluation.     Exercises     Shoulder Instructions      Home Living Family/patient expects to be discharged to:: Private residence Living Arrangements: Spouse/significant other Available Help at Discharge: Family;Friend(s);Available 24 hours/day Type of Home: House Home Access: Stairs to enter [employer] of Steps: 1 Entrance Stairs-Rails: None Home Layout: One level ([place] living room. Currenlty staying in guest bedroom)     Bathroom Shower/Tub: Tub/shower unit;Curtain   Bathroom Toilet: Programmer, systems: No How Accessible: Other (comment) (Wheelchair does not fit into bathroom. ) Home Equipment: Wheelchair - manual;Tub bench   Additional Comments: getting bathroom redone  Lives With: Alone    Prior Functioning/Environment Level of Independence: Needs assistance  Gait / Transfers Assistance Needed: Independent prior to CVA June 2021. Upon d/c from CIR, min assist squat-pivot transfers, +2 mod/HHA ambulation 70', primarily w/c dependent. ADL's / Homemaking Assistance Needed: Assist with UB bathing/dressing EOB and LB bathing/dressing at bed level. Wheelchair does not fit into bathroom.  Communication / Swallowing Assistance Needed: Aphasic Comments: Girlfriend at bedside to confirm PLOF and home set-up.         OT Problem List: Impaired balance (sitting and/or standing);Decreased coordination;Decreased knowledge of use of DME or AE;Impaired sensation;Impaired tone;Impaired UE functional use      OT  Treatment/Interventions: Self-care/ADL training;Therapeutic exercise;Neuromuscular education;Energy conservation;DME and/or AE instruction;Therapeutic activities;Patient/family education;Balance training    OT Goals(Current goals can be found in the care plan section) Acute Rehab OT Goals Patient Stated Goal: To return home with outpatient follow-up  OT Goal Formulation: With patient/family Time For Goal Achievement: 03/23/20 Potential to Achieve Goals: Good ADL Goals Additional ADL Goal #1: Patient will demonstrate increased incorporation of RUE with BADLs and Min A. Additional ADL Goal #2: Patient will complete UB dressing with set-up assist and use of hemi dressing technique to maximize independence and decrease caregiver burden. Additional ADL Goal #3: Patient will demonstrate LB dressing at bed level with Min A to maximize independence and decrease need for assistance.  OT Frequency: Min 2X/week   Barriers to D/C:            Co-evaluation              AM-PAC OT "6 Clicks" Daily Activity     Outcome Measure Help from another person eating meals?: A Little Help from another person taking care of personal grooming?: A Little Help from another person toileting, which includes using toliet, bedpan, or urinal?: A Lot Help from another person bathing (including washing, rinsing, drying)?: A Lot Help from another person to put on and taking off regular upper body clothing?: A Little Help from another person to put on and taking off regular lower body clothing?: A Lot 6 Click Score: 15   End of Session    Activity Tolerance: Patient tolerated treatment well Patient left: in chair;with call bell/phone within reach  OT Visit Diagnosis: Unsteadiness on feet (R26.81);Muscle weakness (  generalized) (M62.81);Hemiplegia and hemiparesis Hemiplegia - Right/Left: Right Hemiplegia - dominant/non-dominant: Dominant Hemiplegia - caused by: Cerebral infarction                Time:  1587-2761 OT Time Calculation (min): 29 min Charges:  OT General Charges $OT Visit: 1 Visit OT Evaluation $OT Eval Moderate Complexity: 1 Mod OT Treatments $Therapeutic Activity: 8-22 mins  Makaiah Terwilliger H. OTR/L Supplemental OT, Department of rehab services [phone]  Jilda Kress R H. 03/09/2020, 12:34 PM

## 2020-03-09 NOTE — Progress Notes (Signed)
Physical Therapy Treatment Patient Details Name: Dustin Schneider MRN: [id number] DOB: 19-Nov-1955 Today's Date: 03/09/2020    History of Present Illness 64 y.o. male who presents with a chief complaint of fever/weakness, dx sepsis secondary to HCAP. CXR shows right midlung consolidation concerning for pna.  PMHx includes recent left MCA CVA 6/21 with residual aphasia and right hemiplegia. Discharged home from [facility] 03/02/20.    PT Comments    OT treated pt this morning and indicated that pt would like to d/c home and initiate the OP Neuro PT that he was to start this week. Pt confirms this during PT session. Pt able to perform seated A/AROM in chair and utilized Stedy for sit>stand, weight shifts and R LE SLS with min guard to min A. PT agrees best course of action is for pt to continue OP Neuro PT therapy to continue to regain R sided function. PT will continue to follow acutely.     Follow Up Recommendations  Outpatient PT;Supervision/Assistance - 24 hour (initiate OP neuro PT)     Equipment Recommendations  None recommended by PT       Precautions / Restrictions Precautions Precautions: Fall;Other (comment) Precaution Comments: dense R hemiparesis, aphasia Restrictions Weight Bearing Restrictions: No    Mobility  Bed Mobility                  Transfers Overall transfer level: Needs assistance Equipment used: None Transfers: Sit to/from Stand;Lateral/Scoot Transfers Sit to Stand: Min guard         General transfer comment: min guard for power up to [employer] Overall balance assessment: Needs assistance Sitting-balance support: Feet supported;No upper extremity supported Sitting balance-Leahy Scale: Good     Standing balance support: Single extremity supported;During functional activity Standing balance-Leahy Scale: Poor Standing balance comment: reliant on external support                            Cognition Arousal/Alertness:  Awake/alert Behavior During Therapy: Flat affect Overall Cognitive Status: Difficult to assess                                 General Comments: aphasic. Following simple commands. Decreased safety awareness.      Exercises General Exercises - Lower Extremity Ankle Circles/Pumps: PROM;Right;10 reps;Seated Quad Sets: AROM;Right;10 reps;Seated Heel Slides: PROM;Right;10 reps;Seated Straight Leg Raises: AAROM;Right;10 reps;Seated Hip Flexion/Marching: PROM;Right;10 reps;Seated Other Exercises Other Exercises: 10 x sit>stand from Stedy pads Other Exercises: 10 weightshift onto R LE Other Exercises: 6x SLS on R LE Other Exercises:  10x B shoulder shrug     General Comments  VSS      Pertinent Vitals/Pain Pain Assessment: No/denies pain           PT Goals (current goals can now be found in the care plan section) Acute Rehab PT Goals Patient Stated Goal: To return home with outpatient follow-up  PT Goal Formulation: With patient/family Time For Goal Achievement: 03/22/20 Potential to Achieve Goals: Good Progress towards PT goals: Progressing toward goals    Frequency    Min 4X/week      PT Plan Discharge plan needs to be updated       AM-PAC PT "6 Clicks" Mobility   Outcome Measure  Help needed turning from your back to your side while in a flat bed without using bedrails?: A Little Help  needed moving from lying on your back to sitting on the side of a flat bed without using bedrails?: A Lot Help needed moving to and from a bed to a chair (including a wheelchair)?: A Lot Help needed standing up from a chair using your arms (e.g., wheelchair or bedside chair)?: A Lot Help needed to walk in hospital room?: A Lot Help needed climbing 3-5 steps with a railing? : Total 6 Click Score: 12    End of Session Equipment Utilized During Treatment: Gait belt Activity Tolerance: Patient tolerated treatment well Patient left: in bed;with call bell/phone within  reach;with bed alarm set Nurse Communication: Mobility status PT Visit Diagnosis: Unsteadiness on feet (R26.81);Hemiplegia and hemiparesis;Other abnormalities of gait and mobility (R26.89) Hemiplegia - Right/Left: Right Hemiplegia - dominant/non-dominant: Dominant     Time: 0102-7253 PT Time Calculation (min) (ACUTE ONLY): 35 min  Charges:  $Therapeutic Exercise: 23-37 mins                     Allisa Einspahr B. Migdalia Dk PT, DPT Acute Rehabilitation Services Pager [phone] Office [phone]    [place] 03/09/2020, 3:01 PM

## 2020-03-10 ENCOUNTER — Encounter: Payer: BC Managed Care – PPO | Admitting: Physical Medicine & Rehabilitation

## 2020-03-10 LAB — BASIC METABOLIC PANEL
Anion gap: 9 (ref 5–15)
BUN: 13 mg/dL (ref 8–23)
CO2: 24 mmol/L (ref 22–32)
Calcium: 8.4 mg/dL — ABNORMAL LOW (ref 8.9–10.3)
Chloride: 104 mmol/L (ref 98–111)
Creatinine, Ser: 0.9 mg/dL (ref 0.61–1.24)
GFR calc Af Amer: >60 mL/min (ref >60)
GFR calc non Af Amer: >60 mL/min (ref >60)
Glucose, Bld: 107 mg/dL — ABNORMAL HIGH (ref 70–99)
Potassium: 4.5 mmol/L (ref 3.5–5.1)
Sodium: 137 mmol/L (ref 135–145)

## 2020-03-10 LAB — MAGNESIUM: Magnesium: 2.3 mg/dL (ref 1.7–2.4)

## 2020-03-10 MED ORDER — DOXYCYCLINE HYCLATE 100 MG PO TABS
100.0000 mg | ORAL_TABLET | Freq: Two times a day (BID) | ORAL | Status: DC
  Administered 2020-03-10 – 2020-03-11 (×3): 100 mg via ORAL
  Filled 2020-03-10 (×3): qty 1

## 2020-03-10 MED ORDER — AMOXICILLIN-POT CLAVULANATE 875-125 MG PO TABS
1.0000 | ORAL_TABLET | Freq: Two times a day (BID) | ORAL | Status: DC
  Administered 2020-03-10: 1 via ORAL
  Filled 2020-03-10: qty 1

## 2020-03-10 MED ORDER — DILTIAZEM HCL ER COATED BEADS 180 MG PO CP24
180.0000 mg | ORAL_CAPSULE | Freq: Every day | ORAL | Status: DC
  Administered 2020-03-10 – 2020-03-11 (×2): 180 mg via ORAL
  Filled 2020-03-10 (×2): qty 1

## 2020-03-10 NOTE — Progress Notes (Signed)
PROGRESS NOTE    Dustin Schneider  TFT:[id number] DOB: 01-05-1956 DOA: 03/06/2020 PCP: Kerin Perna, NP   Brief Narrative:  Patient is a 48-year male with history of hypertension, hyperlipidemia, nonischemic cardiomyopathy, paroxysmal A. fib on Eliquis, BPH, recent CVA with residual right-sided hemiparesis and expressive aphasia who presents with 2-day history of fever.  He was just discharged from rehab to home.  On presentation he was febrile, tachycardic, hypertensive.  He had mild elevated WBC counts.He also went into Afib with RVR.  Chest x-ray was concerning for right midlung opacity.  Patient was admitted for the management of healthcare associated  pneumonia.  Hospital course remarkable for febrile episodes.  He was on  broad-spectrum antibiotics.  PT/OT  evaluated him and recommended outpatient follow up. Plan for cardioversion on Friday.  Assessment & Plan:   Active Problems:   Paroxysmal atrial fibrillation (HCC)   Chronic anticoagulation   Sepsis due to pneumonia ([place])   History of stroke with residual deficit   Transient hypotension   Sepsis secondary to HCAP: Presented with fever, tachycardia, tachypnea, hypotension.  Started on broad-spectrum antibiotics.  Blood cultures, sputum culture :NGTD  .Patient has been afebrile since more than 24 hours.  Aspiration pneumonia was suspected but no evidence of aspiration as per evaluation of speech therapy.   Currently hemodynamically stable.  Urine Legionella is positive.  Continue doxycycline to complete 7 days course.    Community acquired pneumonia: Patient recently discharged from rehab.  Started on regimen for HCAP regimen but urine Legionella was positive so antibiotics changed to doxycycline.  Currently respiratory status stable.  On room air.  Paroxysmal A. fib: Was in A. fib with RVR on presentation.  On Tikosyn previously,now resumed.  He follows with Dr. [name].  Cardiology is following here.  Also on metoprolol and  cardizem.  Heart rate well controlled this morning.  Plan for possible cardioversion in Friday if A. fib persists.  Recent CVA with right-sided residual hemiparesis and expressive aphasia: Had a stroke on 6/21 and is status post clot removal of the left MCA/ACA.  Continue statin and Eliquis.  Chronic diastolic CHF: Currently euvolemic.  Last echocardiogram showed ejection fraction of 55 to 70%, grade 2 diastolic dysfunction.  BPH: On tamsulosin  Hyperlipidemia: On Crestor  Debility/deconditioning: PT/OT evaluated the patient and recommended outpatient follow-up.           DVT prophylaxis:Eliquis Code Status: Full Family Communication: brother in law  present at the bedside  status is: Inpatient  Remains inpatient appropriate because:Plan for cardioversion  Dispo: The patient is from: Home              Anticipated d/c is to: home              Anticipated d/c date is:1 days              Patient currently is not medically stable to d/c.      Consultants: Cardiology  Procedures:None  Antimicrobials:  Anti-infectives (From admission, onward)   Start     Dose/Rate Route Frequency Ordered Stop   03/08/20 2200  doxycycline (VIBRAMYCIN) 100 mg in sodium chloride 0.9 % 250 mL IVPB     Discontinue     100 mg 125 mL/hr over 120 Minutes Intravenous Every 12 hours 03/08/20 1927     03/07/20 2230  azithromycin (ZITHROMAX) 500 mg in sodium chloride 0.9 % 250 mL IVPB  Status:  Discontinued        500 mg 250  mL/hr over 60 Minutes Intravenous Every 24 hours 03/07/20 2200 03/08/20 1927   03/07/20 0100  vancomycin (VANCOCIN) IVPB 1000 mg/200 mL premix  Status:  Discontinued        1,000 mg 200 mL/hr over 60 Minutes Intravenous Every 12 hours 03/06/20 1129 03/07/20 1442   03/06/20 2000  ceFEPIme (MAXIPIME) 2 g in sodium chloride 0.9 % 100 mL IVPB     Discontinue     2 g 200 mL/hr over 30 Minutes Intravenous Every 8 hours 03/06/20 1129     03/06/20 1200  vancomycin (VANCOREADY) IVPB 750  mg/150 mL        750 mg 150 mL/hr over 60 Minutes Intravenous  Once 03/06/20 1122 03/06/20 1343   03/06/20 1030  vancomycin (VANCOCIN) IVPB 1000 mg/200 mL premix        1,000 mg 200 mL/hr over 60 Minutes Intravenous  Once 03/06/20 1029 03/06/20 1211   03/06/20 1030  ceFEPIme (MAXIPIME) 2 g in sodium chloride 0.9 % 100 mL IVPB        2 g 200 mL/hr over 30 Minutes Intravenous  Once 03/06/20 1029 03/06/20 1146      Subjective:  Patient seen and examined at the bedside this morning.  Hemodynamically stable.  Comfortable.  Afebrile.  No new complaints.   Objective: Vitals:   03/09/20 2158 03/10/20 0045 03/10/20 0438 03/10/20 0723  BP: (!) 108/88 (!) 139/119 (!) 118/98 (!) 117/101  Pulse:  92 101 80  Resp:  19 19 16   Temp:  98.1 F (36.7 C) 97.9 F (36.6 C) 98.3 F (36.8 C)  TempSrc:  Oral Oral Oral  SpO2:  97% 95% 95%  Weight:  76.4 kg    Height:        Intake/Output Summary (Last 24 hours) at 03/10/2020 0759 Last data filed at 03/10/2020 0438 Gross per 24 hour  Intake 1638.74 ml  Output 1555 ml  Net 83.74 ml   Filed Weights   03/08/20 0506 03/09/20 0507 03/10/20 0045  Weight: 77.2 kg 76.9 kg 76.4 kg    Examination:   General exam: Deconditioned, debilitated  Respiratory system: No crackles or wheezes  cardiovascular system: Irregularly irregular rhythm. No JVD, murmurs, rubs, gallops or clicks. Gastrointestinal system: Abdomen is nondistended, soft and nontender. No organomegaly or masses felt. Normal bowel sounds heard. Central nervous system: Alert and oriented.  Aphasia, right hemiplegia Extremities: No edema, no clubbing ,no cyanosis  Data Reviewed: I have personally reviewed following labs and imaging studies  CBC: Recent Labs  Lab 03/06/20 0733 03/07/20 0434 03/09/20 0656  WBC 12.4* 9.2 5.7  NEUTROABS 10.5*  --  3.8  HGB 13.2 11.3* 11.3*  HCT 40.6 34.8* 34.7*  MCV 97.6 96.9 94.6  PLT 201 180 633   Basic Metabolic Panel: Recent Labs  Lab  03/06/20 0733 03/06/20 1342 03/07/20 0434 03/09/20 0656 03/10/20 0421  NA 138  --  138 138 137  K 4.3  --  4.2 3.8 4.5  CL 99  --  106 102 104  CO2 26  --  24 25 24   GLUCOSE 136*  --  131* 116* 107*  BUN 10  --  9 10 13   CREATININE 1.04  --  1.02 0.79 0.90  CALCIUM 9.0  --  8.3* 8.3* 8.4*  MG  --  1.9  --  2.3 2.3   GFR: Estimated Creatinine Clearance: 81.8 mL/min (by C-G formula based on SCr of 0.9 mg/dL). Liver Function Tests: Recent Labs  Lab 03/06/20 [phone]  AST 22  ALT 31  ALKPHOS 75  BILITOT 0.9  PROT 6.4*  ALBUMIN 2.9*   No results for input(s): LIPASE, AMYLASE in the last 168 hours. No results for input(s): AMMONIA in the last 168 hours. Coagulation Profile: No results for input(s): INR, PROTIME in the last 168 hours. Cardiac Enzymes: No results for input(s): CKTOTAL, CKMB, CKMBINDEX, TROPONINI in the last 168 hours. BNP (last 3 results) No results for input(s): PROBNP in the last 8760 hours. HbA1C: No results for input(s): HGBA1C in the last 72 hours. CBG: No results for input(s): GLUCAP in the last 168 hours. Lipid Profile: No results for input(s): CHOL, HDL, LDLCALC, TRIG, CHOLHDL, LDLDIRECT in the last 72 hours. Thyroid Function Tests: No results for input(s): TSH, T4TOTAL, FREET4, T3FREE, THYROIDAB in the last 72 hours. Anemia Panel: No results for input(s): VITAMINB12, FOLATE, FERRITIN, TIBC, IRON, RETICCTPCT in the last 72 hours. Sepsis Labs: Recent Labs  Lab 03/06/20 0733 03/06/20 1034 03/06/20 1342 03/07/20 0307  PROCALCITON  --   --  0.10  --   LATICACIDVEN 1.5 2.6* 1.4 1.1    Recent Results (from the past 240 hour(s))  SARS Coronavirus 2 by RT PCR (hospital order, performed in [facility] hospital lab) Nasopharyngeal Nasopharyngeal Swab     Status: None   Collection Time: 03/06/20  7:40 AM   Specimen: Nasopharyngeal Swab  Result Value Ref Range Status   SARS Coronavirus 2 NEGATIVE NEGATIVE Final    Comment: (NOTE) SARS-CoV-2 target  nucleic acids are NOT DETECTED.  The SARS-CoV-2 RNA is generally detectable in upper and lower respiratory specimens during the acute phase of infection. The lowest concentration of SARS-CoV-2 viral copies this assay can detect is 250 copies / mL. A negative result does not preclude SARS-CoV-2 infection and should not be used as the sole basis for treatment or other patient management decisions.  A negative result may occur with improper specimen collection / handling, submission of specimen other than nasopharyngeal swab, presence of viral mutation(s) within the areas targeted by this assay, and inadequate number of viral copies (<250 copies / mL). A negative result must be combined with clinical observations, patient history, and epidemiological information.  Fact Sheet for Patients:   StrictlyIdeas.no  Fact Sheet for Healthcare Providers: BankingDealers.co.za  This test is not yet approved or  cleared by the [place] FDA and has been authorized for detection and/or diagnosis of SARS-CoV-2 by FDA under an Emergency Use Authorization (EUA).  This EUA will remain in effect (meaning this test can be used) for the duration of the COVID-19 declaration under Section 564(b)(1) of the Act, 21 U.S.C. section 360bbb-3(b)(1), unless the authorization is terminated or revoked sooner.  Performed at [facility], [place] [address] [place], [place] [address]   Blood culture (routine x 2)     Status: None (Preliminary result)   Collection Time: 03/06/20 10:26 AM   Specimen: BLOOD  Result Value Ref Range Status   Specimen Description BLOOD RIGHT ANTECUBITAL  Final   Special Requests   Final    BOTTLES DRAWN AEROBIC AND ANAEROBIC Blood Culture adequate volume   Culture   Final    NO GROWTH 2 DAYS Performed at [facility], [place] [address] [place], [place] [address]    Report Status PENDING  Incomplete  Blood culture (routine x 2)      Status: None (Preliminary result)   Collection Time: 03/06/20 10:26 AM   Specimen: BLOOD LEFT WRIST  Result Value Ref  Range Status   Specimen Description BLOOD LEFT WRIST  Final   Special Requests   Final    BOTTLES DRAWN AEROBIC AND ANAEROBIC Blood Culture adequate volume   Culture   Final    NO GROWTH 2 DAYS Performed at [facility], [address], [place], [place] [address]    Report Status PENDING  Incomplete  MRSA PCR Screening     Status: None   Collection Time: 03/06/20 11:55 AM   Specimen: Nasal Mucosa; Nasopharyngeal  Result Value Ref Range Status   MRSA by PCR NEGATIVE NEGATIVE Final    Comment:        The GeneXpert MRSA Assay (FDA approved for NASAL specimens only), is one component of a comprehensive MRSA colonization surveillance program. It is not intended to diagnose MRSA infection nor to guide or monitor treatment for MRSA infections. Performed at [facility], [place] [address], [place],  [address]          Radiology Studies: No results found.      Scheduled Meds: . apixaban  5 mg Oral BID  . bethanechol  25 mg Oral TID  . dofetilide  500 mcg Oral BID  . guaiFENesin  600 mg Oral BID  . metoprolol tartrate  50 mg Oral TID  . rosuvastatin  10 mg Oral QPC supper  . sodium chloride flush  3 mL Intravenous Q12H  . tamsulosin  0.4 mg Oral Q2000   Continuous Infusions: . sodium chloride 250 mL (03/09/20 1323)  . sodium chloride    . ceFEPime (MAXIPIME) IV 2 g (03/10/20 0524)  . doxycycline (VIBRAMYCIN) IV 100 mg (03/09/20 2222)     LOS: 4 days    Time spent: 25 mins.More than 50% of that time was spent in counseling and/or coordination of care.      Shelly Coss, MD [facility] P7/29/2021, 7:59 AM

## 2020-03-10 NOTE — Progress Notes (Signed)
Occupational Therapy Treatment Patient Details Name: Dustin Schneider MRN: [id number] DOB: 1956-03-07 Today's Date: 03/10/2020    History of present illness 64 y.o. male who presents with a chief complaint of fever/weakness, dx sepsis secondary to HCAP. CXR shows right midlung consolidation concerning for pna.  PMHx includes recent left MCA CVA 6/21 with residual aphasia and right hemiplegia. Discharged home from [facility] 03/02/20.   OT comments  Patient in agreement with OT treatment session with focus on RUE NMR and family education with girlfriend and brother present at bedside. Patient with ability to abduction RUE ~85 degrees, flex elbow to touch shoulder, and extend digits 1-5 with synergistic movement pattern this date. Patient/family eduction on incorporation of RUE in as many functional activities as possible including UB BADLs, grooming tasks, and self-feeding with patient/family expressing verbal understanding. Patient would benefit from continued acute OT services in prep for safe d/c home with outpatient OT follow-up.    Follow Up Recommendations  Outpatient OT;Supervision/Assistance - 24 hour    Equipment Recommendations  None recommended by OT    Recommendations for Other Services      Precautions / Restrictions Precautions Precautions: Fall;Other (comment) Precaution Comments: dense R hemiparesis, aphasia Restrictions Weight Bearing Restrictions: No       Mobility Bed Mobility Overal bed mobility: Needs Assistance Bed Mobility: Supine to Sit     Supine to sit: Min assist;+2 for physical assistance     General bed mobility comments: Increased time/effort and use of bedrail with assist for advancement of RLE toward EOB.   Transfers Overall transfer level: Needs assistance Equipment used: None Transfers: Sit to/from [facility] Sit to Stand: Min guard;Min assist Stand pivot transfers: Mod assist;From elevated surface       General transfer  comment: Pt needed min assist to power up to perform stand pivot to recliner.  Requires mod assist to transfer to the relciner as his right ankle inverts and incr extension of LE making the transfer more difficult with pt needing more asisst. Pt then stood to [place] and min guard for power up to [place] once right UE placed on the [place].  Pt perfomed multiple weight shifts in [place] and stood up to 5 minutes. did attempt to take a step forward and back at end of session with pt having bilUE support of therapists however pt still does not have the strength/coordination in right LE to complete step. Also cannot weight bear on right LE to take a step with left LE.     Balance Overall balance assessment: Needs assistance Sitting-balance support: Feet supported;No upper extremity supported Sitting balance-Leahy Scale: Good Sitting balance - Comments: mild R lateral lean is pt preference, able to correct with visual midline cuing   Standing balance support: Single extremity supported;During functional activity Standing balance-Leahy Scale: Poor Standing balance comment: reliant on external support                           ADL either performed or assessed with clinical judgement   ADL                                               Vision       Perception     Praxis      Cognition Arousal/Alertness: Awake/alert Behavior During Therapy: Flat affect Overall Cognitive Status:  Difficult to assess                                 General Comments: aphasic. Following simple commands. Decreased safety awareness.        Exercises Exercises: Other exercises;General Lower Extremity;General Upper Extremity General Exercises - Lower Extremity Ankle Circles/Pumps: PROM;Right;10 reps;Seated Quad Sets: AROM;Right;10 reps;Seated Gluteal Sets: AROM;Both;10 reps;Seated Long Arc Quad: AROM;Right;10 reps;Seated Heel Slides: AAROM;Both;5 reps;Supine Straight Leg  Raises: AAROM;Right;10 reps;Seated Hip Flexion/Marching: Right;AAROM;5 reps;Supine   Shoulder Instructions       General Comments Girlfriend and brother present at bedside    Pertinent Vitals/ Pain       Pain Assessment: No/denies pain Faces Pain Scale: No hurt  Home Living                                          Prior Functioning/Environment              Frequency  Min 2X/week        Progress Toward Goals  OT Goals(current goals can now be found in the care plan section)  Progress towards OT goals: Progressing toward goals  Acute Rehab OT Goals Patient Stated Goal: To return home with outpatient follow-up  OT Goal Formulation: With patient/family Time For Goal Achievement: 03/23/20 Potential to Achieve Goals: Good ADL Goals Additional ADL Goal #1: Patient will demonstrate increased incorporation of RUE with BADLs and Min A. Additional ADL Goal #2: Patient will complete UB dressing with set-up assist and use of hemi dressing technique to maximize independence and decrease caregiver burden. Additional ADL Goal #3: Patient will demonstrate LB dressing at bed level with Min A to maximize independence and decrease need for assistance.  Plan Discharge plan remains appropriate    Co-evaluation                 AM-PAC OT "6 Clicks" Daily Activity     Outcome Measure   Help from another person eating meals?: A Little Help from another person taking care of personal grooming?: A Little Help from another person toileting, which includes using toliet, bedpan, or urinal?: A Lot Help from another person bathing (including washing, rinsing, drying)?: A Lot Help from another person to put on and taking off regular upper body clothing?: A Little Help from another person to put on and taking off regular lower body clothing?: A Lot 6 Click Score: 15    End of Session    OT Visit Diagnosis: Unsteadiness on feet (R26.81);Muscle weakness (generalized)  (M62.81);Hemiplegia and hemiparesis Hemiplegia - Right/Left: Right Hemiplegia - dominant/non-dominant: Dominant Hemiplegia - caused by: Cerebral infarction   Activity Tolerance Patient tolerated treatment well   Patient Left in bed;with call bell/phone within reach;with bed alarm set;with family/visitor present   Nurse Communication          Time: 5176-1607 OT Time Calculation (min): 23 min  Charges: OT General Charges $OT Visit: 1 Visit OT Treatments $Therapeutic Activity: 8-22 mins $Neuromuscular Re-education: 8-22 mins  Damarri Rampy H. OTR/L Supplemental OT, Department of rehab services [phone]   Berenice Oehlert R H. 03/10/2020, 2:44 PM

## 2020-03-10 NOTE — Progress Notes (Signed)
   03/09/20 2004  Assess: MEWS Score  Temp 98.1 F (36.7 C)  BP (!) 126/97  Pulse Rate 102  Resp 19  SpO2 96 %  O2 Device Room Air  Assess: MEWS Score  MEWS Temp 0  MEWS Systolic 0  MEWS Pulse 1  MEWS RR 0  MEWS LOC 0  MEWS Score 1  MEWS Score Color Green  Assess: if the MEWS score is Yellow or Red  Were vital signs taken at a resting state? Yes  Focused Assessment No change from prior assessment  Early Detection of Sepsis Score *See Row Information* Low  MEWS guidelines implemented *See Row Information* No, previously yellow, continue vital signs every 4 hours  Take Vital Signs  Increase Vital Sign Frequency  Yellow: Q 2hr X 2 then Q 4hr X 2, if remains yellow, continue Q 4hrs  Escalate  MEWS: Escalate Yellow: discuss with charge nurse/RN and consider discussing with provider and RRT  Notify: Charge Nurse/RN  Name of Charge Nurse/RN Notified [place], RN  Date Charge Nurse/RN Notified 03/09/20  Time Charge Nurse/RN Notified 2000  Notify: Provider  Provider Name/Title [name], MD   Date Provider Notified 03/10/20  Time Provider Notified 2200  Notification Type Call  Notification Reason Other (Comment) (med question)  Response Other (Comment) (verbal to give scheduled lopressor)  Date of Provider Response 03/09/20  Time of Provider Response 2200  Document  Patient Outcome Other (Comment) (improvement prior to start of shift)  Progress note created (see row info) Yes   Patient MEWS already yellow at change of shift but VS for this shift have been green.  Patient given PO lopressor prior to shift change. MEWS escalated on previous shift and MEWS has been yellow previously this admission.  Continued to follow yellow MEWS protocol since escalated by previous shift.

## 2020-03-10 NOTE — TOC Progression Note (Signed)
Transition of Care [facility]) - Progression Note    Patient Details  Name: AMAY MIJANGOS MRN: [id number] Date of Birth: [birth year]  Transition of Care [facility]) CM/SW [place], [place] Phone Number: 03/10/2020, 5:25 PM  Clinical Narrative:     CSW spoke with patient's brother, [name]. Family has declined SNF and the patient will discharge home.  Thurmond Butts, MSW, [place] Clinical Social Worker   Expected Discharge Plan: Skilled Nursing Facility Barriers to Discharge: Ship broker, Continued Medical Work up  Expected Discharge Plan and Services Expected Discharge Plan: [place]       Living arrangements for the past 2 months: Single Family Home                                       Social Determinants of Health (SDOH) Interventions    Readmission Risk Interventions No flowsheet data found.

## 2020-03-10 NOTE — Progress Notes (Signed)
pts QTC 504 this morning on EKG  MD notified  Will continue to monitor

## 2020-03-10 NOTE — Progress Notes (Signed)
Subjective:  No specific complaints.  Intake/Output from previous day:  I/O last 3 completed shifts: In: 1908.7 [P.O.:600; I.V.:165.4; IV Piggyback:1143.3] Out: 2205 [Urine:2205] Total I/O In: -  Out: 400 [Urine:400]  Blood pressure (!) 117/101, pulse 80, temperature 98.3 F (36.8 C), temperature source Oral, resp. rate 16, height 5' 9" (1.753 m), weight 76.4 kg, SpO2 95 %. Physical Exam Constitutional:      General: He is not in acute distress.    Appearance: He is well-developed.  Cardiovascular:     Rate and Rhythm: Normal rate. Rhythm irregularly irregular.     Pulses: Intact distal pulses.     Heart sounds: No murmur heard.  No gallop.      Comments: S1 variable, S2 normal.   No JVD, No leg edema.   Pulmonary:     Effort: Pulmonary effort is normal. No accessory muscle usage or respiratory distress.     Breath sounds: Normal breath sounds.  Abdominal:     Palpations: Abdomen is soft.     Lab Results: BMP BNP (last 3 results) No results for input(s): BNP in the last 8760 hours.  ProBNP (last 3 results) No results for input(s): PROBNP in the last 8760 hours. BMP Latest Ref Rng & Units 03/10/2020 03/09/2020 03/07/2020  Glucose 70 - 99 mg/dL 107(H) 116(H) 131(H)  BUN 8 - 23 mg/dL 13 10 9  Creatinine 0.61 - 1.24 mg/dL 0.90 0.79 1.02  BUN/Creat Ratio 10 - 24 - - -  Sodium 135 - 145 mmol/L 137 138 138  Potassium 3.5 - 5.1 mmol/L 4.5 3.8 4.2  Chloride 98 - 111 mmol/L 104 102 106  CO2 22 - 32 mmol/L 24 25 24  Calcium 8.9 - 10.3 mg/dL 8.4(L) 8.3(L) 8.3(L)   Hepatic Function Latest Ref Rng & Units 03/06/2020 02/03/2020 01/27/2020  Total Protein 6.5 - 8.1 g/dL 6.4(L) 6.0(L) 6.2(L)  Albumin 3.5 - 5.0 g/dL 2.9(L) 3.2(L) 4.1  AST 15 - 41 U/L 22 38 23  ALT 0 - 44 U/L 31 85(H) 37  Alk Phosphatase 38 - 126 U/L 75 73 45  Total Bilirubin 0.3 - 1.2 mg/dL 0.9 0.8 1.0   CBC Latest Ref Rng & Units 03/09/2020 03/07/2020 03/06/2020  WBC 4.0 - 10.5 K/uL 5.7 9.2 12.4(H)  Hemoglobin 13.0  - 17.0 g/dL 11.3(L) 11.3(L) 13.2  Hematocrit 39 - 52 % 34.7(L) 34.8(L) 40.6  Platelets 150 - 400 K/uL 234 180 201   Lipid Panel     Component Value Date/Time   CHOL 125 01/28/2020 0716   CHOL 226 (H) 12/17/2019 0831   TRIG 298 (H) 01/28/2020 0716   HDL 32 (L) 01/28/2020 0716   HDL 39 (L) 12/17/2019 0831   CHOLHDL 3.9 01/28/2020 0716   VLDL 60 (H) 01/28/2020 0716   LDLCALC 33 01/28/2020 0716   LDLCALC 154 (H) 12/17/2019 0831   Cardiac Panel (last 3 results) No results for input(s): CKTOTAL, CKMB, TROPONINI, RELINDX in the last 72 hours.  HEMOGLOBIN A1C Lab Results  Component Value Date   HGBA1C 5.6 01/28/2020   MPG 114 01/28/2020   TSH Recent Labs    12/17/19 0836  TSH 3.160   Imaging: Chest x-ray 03/06/2020: Right midlung airspace consolidation concerning for pneumonia. Followup PA and lateral chest X-ray is recommended in 3-4 weeks following trial of antibiotic therapy to ensure resolution and exclude underlying malignancy.  Cardiac Studies:  Nuclear stress test 05/05/2018: 1. The resting electrocardiogram demonstrated atrial fibrillation. The stress electrocardiogram was positive for ischemia with 2 mm   downsloping ST depression in the inferior and lateral leads, persisted for 2 mintues into recovery. Stress symptoms included palpitations and fatigue. Patient exercised on Bruce protocol for 6:00 minutes and achieved 7.05 METS. Stress test terminated due to fatigue and 113% MPHR achieved (Target HR >85%). 2. Left ventricular cavity is noted to be normal on the rest and stress studies. SPECT images demonstrate homogeneous tracer distribution throughout the myocardium. The left ventricular ejection fraction was calculated to be 38%, but visually appears to be at least low normal without wall motion abnormality. This is an intermediate risk study due to abnormal ST change and low LVEF (difficult in view of gating with A. Fib), clinical correlation  recommended.  Echocardiogram 01/28/2020:  1. The left ventricle has normal function. The left ventricle has no regional wall motion abnormalities. Left ventricular diastolic parameters are  consistent with Grade II diastolic dysfunction (pseudonormalization).  2. Right ventricular systolic function is normal. The right ventricular size is normal. There is mildly elevated pulmonary artery systolic pressure.  3. Left atrial size was moderately dilated.  4. Right atrial size was moderately dilated.  5. The mitral valve is normal in structure. Mild mitral valve regurgitation.  6. The aortic valve is normal in structure. Aortic valve regurgitation is mild to moderate.  7. The inferior vena cava is dilated in size with >50% respiratory variability, suggesting right atrial pressure of 8 mmHg.  EKG:  EKG 03/09/2020: Atrial flutter, atypical with controlled ventricular response at the rate of 98 bpm, single PVC, nonspecific ST-T abnormality, normal QT interval.  EKG 03/06/2020: Atrial fibrillation with rapid ventricular response at the rate of 100 bpm, normal axis, nonspecific T abnormality.  Normal QT interval.  Scheduled Meds: . amoxicillin-clavulanate  1 tablet Oral Q12H  . apixaban  5 mg Oral BID  . bethanechol  25 mg Oral TID  . diltiazem  180 mg Oral Daily  . dofetilide  500 mcg Oral BID  . guaiFENesin  600 mg Oral BID  . metoprolol tartrate  50 mg Oral TID  . rosuvastatin  10 mg Oral QPC supper  . sodium chloride flush  3 mL Intravenous Q12H  . tamsulosin  0.4 mg Oral Q2000   Continuous Infusions: . sodium chloride 250 mL (03/09/20 1323)  . sodium chloride     PRN Meds:.sodium chloride, sodium chloride, acetaminophen, senna-docusate, sodium chloride flush  Assessment/Plan:  1.  Persistent atrial fibrillation with RVR CHA2DS2-VASc Score is 4.  Yearly risk of stroke: 4% (A, HTN, CVA).  Score of 1=0.6; 2=2.2; 3=3.2; 4=4.8; 5=7.2; 6=9.8; 7=>9.8) -(CHF; HTN; vasc disease DM,   Male = 1; Age <65 =0; 65-74 = 1,  >75 =2; stroke/embolism= 2).   2.  Essential hypertension 3. TIA and stroke with residual deficit right hemiparesis, now moving his arms  Recommendation: Patient is tolerating dofetilide and in spite of metoprolol 50 tid, HR uncontrolled. Will add Cardizem for better control. Minimal interaction with dofetalide.  We will plan on performing TEE guided cardioversion Friday morning if still in A. fib.  Blood pressure is well controlled.   Vanity Larsson, M.D. 03/10/2020, 8:44 AM [facility] Pager: [phone] Office: [phone] If no answer: [phone]  

## 2020-03-10 NOTE — Progress Notes (Signed)
Physical Therapy Treatment Patient Details Name: Dustin Schneider MRN: [id number] DOB: 1956-03-23 Today's Date: 03/10/2020    History of Present Illness 64 y.o. male who presents with a chief complaint of fever/weakness, dx sepsis secondary to HCAP. CXR shows right midlung consolidation concerning for pna.  PMHx includes recent left MCA CVA 6/21 with residual aphasia and right hemiplegia. [facility].    PT Comments    Pt admitted with above diagnosis. Pt was able to perform several pre gait activities with PT assist +2 min to mod assist overall. Brother in Sports coach in agreement that Outpt is best as they do not like any of the LTAC options.  Pt currently with functional limitations due to balance and endurance deficits. Pt will benefit from skilled PT to increase their independence and safety with mobility to allow discharge to the venue listed below.     Follow Up Recommendations  Outpatient PT;Supervision/Assistance - 24 hour (initiate OP neuro PT)     Equipment Recommendations  None recommended by PT    Recommendations for Other Services       Precautions / Restrictions Precautions Precautions: Fall;Other (comment) Precaution Comments: dense R hemiparesis, aphasia Restrictions Weight Bearing Restrictions: No    Mobility  Bed Mobility Overal bed mobility: Needs Assistance Bed Mobility: Supine to Sit     Supine to sit: Min assist;+2 for physical assistance     General bed mobility comments: Increased time/effort and use of bedrail with assist for advancement of RLE toward EOB.   Transfers Overall transfer level: Needs assistance Equipment used: None Transfers: Sit to/from [facility] Sit to Stand: Min guard;Min assist Stand pivot transfers: Mod assist;From elevated surface       General transfer comment: Pt needed min assist to power up to perform stand pivot to recliner.  Requires mod assist to transfer to the relciner as his right  ankle inverts and incr extension of LE making the transfer more difficult with pt needing more asisst. Pt then stood to [place] and min guard for power up to [place] once right UE placed on the [place].  Pt perfomed multiple weight shifts in [place] and stood up to 5 minutes. did attempt to take a step forward and back at end of session with pt having bilUE support of therapists however pt still does not have the strength/coordination in right LE to complete step. Also cannot weight bear on right LE to take a step with left LE.   Ambulation/Gait             General Gait Details: deferred   Stairs             Wheelchair Mobility    Modified Rankin (Stroke Patients Only) Modified Rankin (Stroke Patients Only) Pre-Morbid Rankin Score: No symptoms Modified Rankin: Moderately severe disability     Balance Overall balance assessment: Needs assistance Sitting-balance support: Feet supported;No upper extremity supported Sitting balance-Leahy Scale: Good Sitting balance - Comments: mild R lateral lean is pt preference, able to correct with visual midline cuing   Standing balance support: Single extremity supported;During functional activity Standing balance-Leahy Scale: Poor Standing balance comment: reliant on external support                            Cognition Arousal/Alertness: Awake/alert Behavior During Therapy: Flat affect Overall Cognitive Status: Difficult to assess  General Comments: aphasic. Following simple commands. Decreased safety awareness.      Exercises General Exercises - Lower Extremity Ankle Circles/Pumps: PROM;Right;10 reps;Seated Quad Sets: AROM;Right;10 reps;Seated Gluteal Sets: AROM;Both;10 reps;Seated Long Arc Quad: AROM;Right;10 reps;Seated Heel Slides: AAROM;Both;5 reps;Supine Straight Leg Raises: AAROM;Right;10 reps;Seated Hip Flexion/Marching: Right;AAROM;5 reps;Supine    General Comments  General comments (skin integrity, edema, etc.): Pts brother in law present. [facility] brought pts new wheelchair per family request while PT wa in room.       Pertinent Vitals/Pain Faces Pain Scale: No hurt    Home Living                      Prior Function            PT Goals (current goals can now be found in the care plan section) Acute Rehab PT Goals Patient Stated Goal: To return home with outpatient follow-up  Progress towards PT goals: Progressing toward goals    Frequency    Min 3X/week      PT Plan Current plan remains appropriate;Frequency needs to be updated    Co-evaluation              AM-PAC PT "6 Clicks" Mobility   Outcome Measure  Help needed turning from your back to your side while in a flat bed without using bedrails?: A Little Help needed moving from lying on your back to sitting on the side of a flat bed without using bedrails?: A Little Help needed moving to and from a bed to a chair (including a wheelchair)?: A Lot Help needed standing up from a chair using your arms (e.g., wheelchair or bedside chair)?: A Lot Help needed to walk in hospital room?: A Lot Help needed climbing 3-5 steps with a railing? : Total 6 Click Score: 13    End of Session Equipment Utilized During Treatment: Gait belt Activity Tolerance: Patient tolerated treatment well Patient left: with call bell/phone within reach;in chair;with chair alarm set;with family/visitor present Nurse Communication: Mobility status PT Visit Diagnosis: Unsteadiness on feet (R26.81);Hemiplegia and hemiparesis;Other abnormalities of gait and mobility (R26.89) Hemiplegia - Right/Left: Right Hemiplegia - dominant/non-dominant: Dominant     Time: 6659-9357 PT Time Calculation (min) (ACUTE ONLY): 35 min  Charges:  $Gait Training: 8-22 mins $Therapeutic Exercise: 8-22 mins                     Sukanya Goldblatt W,PT Acute Rehabilitation Services Pager:  [phone]  Office:   [place] 03/10/2020, 1:19 PM

## 2020-03-10 NOTE — H&P (View-Only) (Signed)
Subjective:  No specific complaints.  Intake/Output from previous day:  I/O last 3 completed shifts: In: 1908.7 [P.O.:600; I.V.:165.4; IV Piggyback:1143.3] Out: 2205 [Urine:2205] Total I/O In: -  Out: 400 [Urine:400]  Blood pressure (!) 117/101, pulse 80, temperature 98.3 F (36.8 C), temperature source Oral, resp. rate 16, height _0  (1.753 m), weight 76.4 kg, SpO2 95 %. Physical Exam Constitutional:      General: He is not in acute distress.    Appearance: He is well-developed.  Cardiovascular:     Rate and Rhythm: Normal rate. Rhythm irregularly irregular.     Pulses: Intact distal pulses.     Heart sounds: No murmur heard.  No gallop.      Comments: S1 variable, S2 normal.   No JVD, No leg edema.   Pulmonary:     Effort: Pulmonary effort is normal. No accessory muscle usage or respiratory distress.     Breath sounds: Normal breath sounds.  Abdominal:     Palpations: Abdomen is soft.     Lab Results: BMP BNP (last 3 results) No results for input(s): BNP in the last 8760 hours.  ProBNP (last 3 results) No results for input(s): PROBNP in the last 8760 hours. BMP Latest Ref Rng & Units 03/10/2020 03/09/2020 03/07/2020  Glucose 70 - 99 mg/dL 107(H) 116(H) 131(H)  BUN 8 - 23 mg/dL _1 Creatinine 0.61 - 1.24 mg/dL 0.90 0.79 1.02  BUN/Creat Ratio 10 - 24 - - -  Sodium 135 - 145 mmol/L 137 138 138  Potassium 3.5 - 5.1 mmol/L 4.5 3.8 4.2  Chloride 98 - 111 mmol/L 104 102 106  CO2 22 - 32 mmol/L _2 Calcium 8.9 - 10.3 mg/dL 8.4(L) 8.3(L) 8.3(L)   Hepatic Function Latest Ref Rng & Units 03/06/2020 02/03/2020 01/27/2020  Total Protein 6.5 - 8.1 g/dL 6.4(L) 6.0(L) 6.2(L)  Albumin 3.5 - 5.0 g/dL 2.9(L) 3.2(L) 4.1  AST 15 - 41 U/L 22 38 23  ALT 0 - 44 U/L 31 85(H) 37  Alk Phosphatase 38 - 126 U/L 75 73 45  Total Bilirubin 0.3 - 1.2 mg/dL 0.9 0.8 1.0   CBC Latest Ref Rng & Units 03/09/2020 03/07/2020 03/06/2020  WBC 4.0 - 10.5 K/uL 5.7 9.2 12.4(H)  Hemoglobin 13.0  - 17.0 g/dL 11.3(L) 11.3(L) 13.2  Hematocrit 39 - 52 % 34.7(L) 34.8(L) 40.6  Platelets 150 - 400 K/uL 234 180 201   Lipid Panel     Component Value Date/Time   CHOL 125 01/28/2020 0716   CHOL 226 (H) 12/17/2019 0831   TRIG 298 (H) 01/28/2020 0716   HDL 32 (L) 01/28/2020 0716   HDL 39 (L) 12/17/2019 0831   CHOLHDL 3.9 01/28/2020 0716   VLDL 60 (H) 01/28/2020 0716   LDLCALC 33 01/28/2020 0716   LDLCALC 154 (H) 12/17/2019 0831   Cardiac Panel (last 3 results) No results for input(s): CKTOTAL, CKMB, TROPONINI, RELINDX in the last 72 hours.  HEMOGLOBIN A1C Lab Results  Component Value Date   HGBA1C 5.6 01/28/2020   MPG 114 01/28/2020   TSH Recent Labs    12/17/19 0836  TSH 3.160   Imaging: Chest x-ray 03/06/2020: Right midlung airspace consolidation concerning for pneumonia. Followup PA and lateral chest X-ray is recommended in 3-4 weeks following trial of antibiotic therapy to ensure resolution and exclude underlying malignancy.  Cardiac Studies:  Nuclear stress test 05/05/2018: 1. The resting electrocardiogram demonstrated atrial fibrillation. The stress electrocardiogram was positive for ischemia with 2 mm  downsloping ST depression in the inferior and lateral leads, persisted for 2 mintues into recovery. Stress symptoms included palpitations and fatigue. Patient exercised on Bruce protocol for 6:00 minutes and achieved 7.05 METS. Stress test terminated due to fatigue and 113% MPHR achieved (Target HR >85%). 2. Left ventricular cavity is noted to be normal on the rest and stress studies. SPECT images demonstrate homogeneous tracer distribution throughout the myocardium. The left ventricular ejection fraction was calculated to be 38%, but visually appears to be at least low normal without wall motion abnormality. This is an intermediate risk study due to abnormal ST change and low LVEF (difficult in view of gating with A. Fib), clinical correlation  recommended.  Echocardiogram 01/28/2020:  1. The left ventricle has normal function. The left ventricle has no regional wall motion abnormalities. Left ventricular diastolic parameters are  consistent with Grade II diastolic dysfunction (pseudonormalization).  2. Right ventricular systolic function is normal. The right ventricular size is normal. There is mildly elevated pulmonary artery systolic pressure.  3. Left atrial size was moderately dilated.  4. Right atrial size was moderately dilated.  5. The mitral valve is normal in structure. Mild mitral valve regurgitation.  6. The aortic valve is normal in structure. Aortic valve regurgitation is mild to moderate.  7. The inferior vena cava is dilated in size with >50% respiratory variability, suggesting right atrial pressure of 8 mmHg.  EKG:  EKG 03/09/2020: Atrial flutter, atypical with controlled ventricular response at the rate of 98 bpm, single PVC, nonspecific ST-T abnormality, normal QT interval.  EKG 03/06/2020: Atrial fibrillation with rapid ventricular response at the rate of 100 bpm, normal axis, nonspecific T abnormality.  Normal QT interval.  Scheduled Meds: . amoxicillin-clavulanate  1 tablet Oral Q12H  . apixaban  5 mg Oral BID  . bethanechol  25 mg Oral TID  . diltiazem  180 mg Oral Daily  . dofetilide  500 mcg Oral BID  . guaiFENesin  600 mg Oral BID  . metoprolol tartrate  50 mg Oral TID  . rosuvastatin  10 mg Oral QPC supper  . sodium chloride flush  3 mL Intravenous Q12H  . tamsulosin  0.4 mg Oral Q2000   Continuous Infusions: . sodium chloride 250 mL (03/09/20 1323)  . sodium chloride     PRN Meds:.sodium chloride, sodium chloride, acetaminophen, senna-docusate, sodium chloride flush  Assessment/Plan:  1.  Persistent atrial fibrillation with RVR CHA2DS2-VASc Score is 4.  Yearly risk of stroke: 4% (A, HTN, CVA).  Score of 1=0.6; 2=2.2; 3=3.2; 4=4.8; 5=7.2; 6=9.8; 7=>9.8) -(CHF; HTN; vasc disease DM,   Male = 1; Age <65 =0; 65-74 = 1,  >75 =2; stroke/embolism= 2).   2.  Essential hypertension 3. TIA and stroke with residual deficit right hemiparesis, now moving his arms  Recommendation: Patient is tolerating dofetilide and in spite of metoprolol 50 tid, HR uncontrolled. Will add Cardizem for better control. Minimal interaction with dofetalide.  We will plan on performing TEE guided cardioversion Friday morning if still in A. fib.  Blood pressure is well controlled.   [name], M.D. 03/10/2020, 8:44 AM [facility] Pager: [phone] Office: [phone] If no answer: [phone]

## 2020-03-11 ENCOUNTER — Encounter (HOSPITAL_COMMUNITY): Payer: Self-pay | Admitting: Internal Medicine

## 2020-03-11 ENCOUNTER — Inpatient Hospital Stay (HOSPITAL_COMMUNITY): Payer: BC Managed Care – PPO | Admitting: Certified Registered Nurse Anesthetist

## 2020-03-11 ENCOUNTER — Encounter (HOSPITAL_COMMUNITY)
Admission: EM | Disposition: A | Discharge status: Home Health | Payer: Self-pay | Source: Home / Self Care | Attending: Internal Medicine

## 2020-03-11 ENCOUNTER — Inpatient Hospital Stay (HOSPITAL_COMMUNITY): Payer: BC Managed Care – PPO

## 2020-03-11 ENCOUNTER — Ambulatory Visit: Payer: BC Managed Care – PPO | Admitting: Cardiology

## 2020-03-11 HISTORY — PX: BUBBLE STUDY: SHX6837

## 2020-03-11 HISTORY — PX: CARDIOVERSION: SHX1299

## 2020-03-11 HISTORY — PX: TEE WITHOUT CARDIOVERSION: SHX5443

## 2020-03-11 LAB — CULTURE, BLOOD (ROUTINE X 2)
Culture: NO GROWTH
Culture: NO GROWTH
Special Requests: ADEQUATE
Special Requests: ADEQUATE

## 2020-03-11 LAB — BASIC METABOLIC PANEL
Anion gap: 10 (ref 5–15)
BUN: 12 mg/dL (ref 8–23)
CO2: 26 mmol/L (ref 22–32)
Calcium: 8.6 mg/dL — ABNORMAL LOW (ref 8.9–10.3)
Chloride: 103 mmol/L (ref 98–111)
Creatinine, Ser: 0.85 mg/dL (ref 0.61–1.24)
GFR calc Af Amer: >60 mL/min (ref >60)
GFR calc non Af Amer: >60 mL/min (ref >60)
Glucose, Bld: 111 mg/dL — ABNORMAL HIGH (ref 70–99)
Potassium: 4.1 mmol/L (ref 3.5–5.1)
Sodium: 139 mmol/L (ref 135–145)

## 2020-03-11 LAB — PROTIME-INR
INR: 1.3 — ABNORMAL HIGH (ref 0.8–1.2)
Prothrombin Time: 15.8 seconds — ABNORMAL HIGH (ref 11.4–15.2)

## 2020-03-11 LAB — MAGNESIUM: Magnesium: 2.1 mg/dL (ref 1.7–2.4)

## 2020-03-11 SURGERY — ECHOCARDIOGRAM, TRANSESOPHAGEAL
Anesthesia: Monitor Anesthesia Care

## 2020-03-11 MED ORDER — METOPROLOL TARTRATE 50 MG PO TABS: 50.0000 mg | ORAL_TABLET | Freq: Three times a day (TID) | ORAL | 1 refills | Status: DC

## 2020-03-11 MED ORDER — SODIUM CHLORIDE 0.9 % IV SOLN: INTRAVENOUS | Status: DC

## 2020-03-11 MED ORDER — PROPOFOL 500 MG/50ML IV EMUL
INTRAVENOUS | Status: DC | PRN
  Administered 2020-03-11: 100 ug/kg/min via INTRAVENOUS

## 2020-03-11 MED ORDER — BUTAMBEN-TETRACAINE-BENZOCAINE 2-2-14 % EX AERO
INHALATION_SPRAY | CUTANEOUS | Status: DC | PRN
  Administered 2020-03-11: 1 via TOPICAL

## 2020-03-11 MED ORDER — DOFETILIDE 500 MCG PO CAPS: 500.0000 ug | ORAL_CAPSULE | Freq: Two times a day (BID) | ORAL | 1 refills | Status: DC

## 2020-03-11 MED ORDER — DOXYCYCLINE HYCLATE 100 MG PO TABS: 100.0000 mg | ORAL_TABLET | Freq: Two times a day (BID) | ORAL | 0 refills | Status: AC

## 2020-03-11 MED ORDER — DILTIAZEM HCL ER COATED BEADS 180 MG PO CP24: 180.0000 mg | ORAL_CAPSULE | Freq: Every day | ORAL | 1 refills | Status: DC

## 2020-03-11 MED ORDER — GUAIFENESIN ER 600 MG PO TB12: 600.0000 mg | ORAL_TABLET | Freq: Two times a day (BID) | ORAL | 0 refills | Status: DC | PRN

## 2020-03-11 MED ORDER — PROPOFOL 10 MG/ML IV BOLUS
INTRAVENOUS | Status: DC | PRN
  Administered 2020-03-11: 20 mg via INTRAVENOUS
  Administered 2020-03-11: 10 mg via INTRAVENOUS

## 2020-03-11 MED ORDER — SODIUM CHLORIDE 0.9 % IV SOLN: INTRAVENOUS | Status: DC | PRN

## 2020-03-11 MED FILL — CARTIA XT 180 MG CAPSULE SA: 180 | 30 days supply | Qty: 30 | Fill #0

## 2020-03-11 MED FILL — DOXYCYCLINE HYCLATE 100 MG: 100 | 5 days supply | Qty: 10 | Fill #0

## 2020-03-11 MED FILL — DOFETILIDE 500 MCG CAPS: 500 | 30 days supply | Qty: 60 | Fill #0

## 2020-03-11 MED FILL — MUCUS RELIEF 600 MG TB12: 600 | 7 days supply | Qty: 14 | Fill #0

## 2020-03-11 MED FILL — METOPROLOL TARTRATE 50 MG T: 50 | 30 days supply | Qty: 90 | Fill #0

## 2020-03-11 NOTE — Transfer of Care (Signed)
Immediate Anesthesia Transfer of Care Note  Patient: Dustin Schneider  Procedure(s) Performed: TRANSESOPHAGEAL ECHOCARDIOGRAM (TEE) (N/A ) CARDIOVERSION (N/A )  Patient Location: Endoscopy Unit  Anesthesia Type:General  Level of Consciousness: awake and drowsy  Airway & Oxygen Therapy: Patient Spontanous Breathing and Patient connected to nasal cannula oxygen  Post-op Assessment: Report given to RN, Post -op Vital signs reviewed and stable and Patient moving all extremities X 4  Post vital signs: Reviewed and stable  Last Vitals:  Vitals Value Taken Time  BP    Temp    Pulse    Resp    SpO2      Last Pain:  Vitals:   03/11/20 0715  TempSrc: Oral  PainSc: 0-No pain         Complications: No complications documented.

## 2020-03-11 NOTE — CV Procedure (Signed)
TEE: Under deep sedation with propafol, TEE was performed without complications: LV: Normal. Normal EF. RV: Normal LA: Normal. Left atrial appendage: Normal without thrombus. Normal function. Inter atrial septum is intact without defect. Double contrast study negative for atrial level shunting. No late appearance of bubbles either. RA: Normal  MV: Normal Mild  MR. TV: Normal Trace TR AV: Normal. Mild  AI . PV: Normal. Trace PI.  Thoracic and ascending aorta: Minimal plaque or atheromatous changes.   Direct current cardioversion:  Indication symptomatic A. Fibrillation.  Procedure: Using propofol for deep sedation, assist from anesthesia,  synchronized direct current cardioversion performed. Patient was delivered with 120x1 then 150 Joules of electricity X 1 with success to NSR. Patient tolerated the procedure well. No immediate complication noted.     [name], MD, [facility] 03/11/2020, 8:06 AM Office: [phone]

## 2020-03-11 NOTE — Progress Notes (Signed)
Pharmacy: Dofetilide (Tikosyn) - Follow Up Assessment and Electrolyte Replacement  Pharmacy consulted to assist in monitoring and replacing electrolytes in this 64 y.o. male admitted on 03/06/2020 undergoing dofetilide re- initiation. Of note, patient was on Tikosyn prior to June 2021 admission for CVA.  Pt remained off Tikosyn on discharge with plans for outpatient follow-up.  Pt readmitted 7/25 and Tikosyn restarted.  First dose: 7/27 PM.  Labs:    Component Value Date/Time   K 4.1 03/11/2020 0138   MG 2.1 03/11/2020 0138     Plan: Potassium: K >/= 4: No additional supplementation needed  Magnesium: Mg > 2: No additional supplementation needed   Thank you for allowing pharmacy to be a part of this patient's care.  Alycia Rossetti, PharmD, BCPS Clinical Pharmacist Clinical phone for 03/11/2020: [phone] 03/11/2020 8:58 AM   **Pharmacist phone directory can now be found on [email or web address] ([email or web address]).  Listed under [place].

## 2020-03-11 NOTE — Progress Notes (Deleted)
  Echocardiogram 2D Echocardiogram has been performed.  Dustin Schneider 03/11/2020, 8:22 AM

## 2020-03-11 NOTE — Progress Notes (Signed)
  Echocardiogram Echocardiogram Transesophageal has been performed.  Dustin Schneider 03/11/2020, 8:23 AM

## 2020-03-11 NOTE — Anesthesia Procedure Notes (Signed)
Procedure Name: MAC Date/Time: 03/11/2020 7:45 AM Performed by: Harden Mo, CRNA Pre-anesthesia Checklist: Patient identified, Emergency Drugs available, Suction available and Patient being monitored Patient Re-evaluated:Patient Re-evaluated prior to induction Oxygen Delivery Method: Nasal cannula Preoxygenation: Pre-oxygenation with 100% oxygen Induction Type: IV induction Placement Confirmation: positive ETCO2 and breath sounds checked- equal and bilateral Dental Injury: Teeth and Oropharynx as per pre-operative assessment

## 2020-03-11 NOTE — TOC Transition Note (Signed)
Transition of Care [facility]) - CM/SW Discharge Note   Patient Details  Name: Dustin Schneider MRN: [id number] Date of Birth: Nov 27, 1955  Transition of Care [facility]) CM/SW Contact:  Zenon Mayo, RN Phone Number: 03/11/2020, 9:26 AM   Clinical Narrative:    NCM received information from [place] that patient family wants outpatient therapy instead of SNF.  NCM spoke with POA, Rosana Fret at the bedside he states they would like to get referral to outpatient therapy on [facility].  NCM made referral thru  [facility].  POA said they have all the DME they need.    Final next level of care: Home/Self Care Barriers to Discharge: No Barriers Identified   Patient Goals and CMS Choice Patient states their goals for this hospitalization and ongoing recovery are:: OP therapy CMS Medicare.gov Compare Post Acute Care list provided to:: Patient Represenative (must comment) Choice offered to / list presented to : [place] / [place]  Discharge Placement                       Discharge Plan and Services                  DME Agency: NA       HH Arranged: NA          Social Determinants of Health (SDOH) Interventions     Readmission Risk Interventions No flowsheet data found.

## 2020-03-11 NOTE — Interval H&P Note (Signed)
History and Physical Interval Note:  03/11/2020 7:44 AM  Dustin Schneider  has presented today for surgery, with the diagnosis of afib.  The various methods of treatment have been discussed with the patient and family. After consideration of risks, benefits and other options for treatment, the patient has consented to  Procedure(s): TRANSESOPHAGEAL ECHOCARDIOGRAM (TEE) (N/A) CARDIOVERSION (N/A) as a surgical intervention.  The patient's history has been reviewed, patient examined, no change in status, stable for surgery.  I have reviewed the patient's chart and labs.  Questions were answered to the patient's satisfaction.     [name]

## 2020-03-11 NOTE — Anesthesia Preprocedure Evaluation (Addendum)
Anesthesia Evaluation  Patient identified by MRN, date of birth, ID band Patient awake  General Assessment Comment:Expressive aphasia able to answer yes and no  Reviewed: Allergy & Precautions, NPO status , Patient's Chart, lab work & pertinent test results  Airway Mallampati: II  TM Distance: >3 FB Neck ROM: Full    Dental  (+) Teeth Intact, Dental Advisory Given   Pulmonary neg pulmonary ROS, neg recent URI,     + decreased breath sounds      Cardiovascular hypertension, Pt. on home beta blockers and Pt. on medications + dysrhythmias Atrial Fibrillation  Rhythm:Irregular Rate:Tachycardia     Neuro/Psych CVA, Residual Symptoms    GI/Hepatic negative GI ROS, Neg liver ROS,   Endo/Other  negative endocrine ROS  Renal/GU Renal disease     Musculoskeletal negative musculoskeletal ROS (+)   Abdominal   Peds  Hematology  (+) Blood dyscrasia (Xarelto), ,   Anesthesia Other Findings Day of surgery medications reviewed with the patient.  Reproductive/Obstetrics                            Anesthesia Physical Anesthesia Plan  ASA: III  Anesthesia Plan: General   Post-op Pain Management:    Induction: Intravenous  PONV Risk Score and Plan: 2 and Treatment may vary due to age or medical condition and Propofol infusion  Airway Management Planned: Nasal Cannula  Additional Equipment: None  Intra-op Plan:   Post-operative Plan:   Informed Consent: I have reviewed the patients History and Physical, chart, labs and discussed the procedure including the risks, benefits and alternatives for the proposed anesthesia with the patient or authorized representative who has indicated his/her understanding and acceptance.     Dental advisory given  Plan Discussed with: CRNA and Surgeon  Anesthesia Plan Comments:        Anesthesia Quick Evaluation

## 2020-03-11 NOTE — Discharge Summary (Addendum)
Physician Discharge Summary  Dustin Schneider YNW:[id number] DOB: 12-Sep-1955 DOA: 03/06/2020  PCP: Kerin Perna, NP  Admit date: 03/06/2020 Discharge date: 03/11/2020  Admitted From: Home Disposition:  Home  Discharge Condition:Stable CODE STATUS:FULL Diet recommendation: Heart Healthy  Brief/Interim Summary:  Patient is a 64-year male with history of hypertension, hyperlipidemia, nonischemic cardiomyopathy, paroxysmal A. fib on Eliquis, BPH, recent CVA with residual right-sided hemiparesis and expressive aphasia who presents with 2-day history of fever.  He was just discharged from rehab to home.  On presentation he was febrile, tachycardic, hypertensive.  He had mild elevated WBC counts.He also went into Afib with RVR.  Chest x-ray was concerning for right midlung opacity.  Patient was admitted for the management of community acquired pneumonia.  Hospital course remarkable for febrile episodes.  He was on  broad-spectrum antibiotics.  Urine was positive for Legionella antigen.  Currently he is hemodynamically stable.  Antibiotics changed to oral.  Due to persistent A. fib, he underwent cardioversion today.  He is hemodynamically stable for discharge to home today with home health.  Following problems were addressed during his hospitalization:  Sepsis secondary to HCAP: Sepsis ruled out.Presented with fever, tachycardia, tachypnea, hypotension.  Started on broad-spectrum antibiotics.  Blood cultures, sputum culture :NGTD  .Patient has been afebrile since more than 24 hours.  Aspiration pneumonia was suspected but no evidence of aspiration as per evaluation of speech therapy.   Currently hemodynamically stable.  Urine Legionella is positive.  Continue doxycycline to complete 7 days course.    Community acquired pneumonia: Patient recently discharged from rehab.  Started on regimen for HCAP regimen but urine Legionella was positive so antibiotics changed to doxycycline.  Currently  respiratory status stable.  On room air.  Paroxysmal A. fib: Was in A. fib with RVR on presentation.  On Tikosyn previously,now resumed.  He follows with Dr. [name].  Cardiology was following here.  Also on metoprolol and cardizem.    Underwent cardioversion with conversion to normal sinus rhythm.  Recent CVA with right-sided residual hemiparesis and expressive aphasia: Had a stroke on 6/21 and is status post clot removal of the left MCA/ACA.  Continue statin and Eliquis.  Chronic diastolic CHF: Currently euvolemic.  Last echocardiogram showed ejection fraction of 55 to 65%, grade 2 diastolic dysfunction.  BPH: On tamsulosin  Hyperlipidemia: On Crestor  Debility/deconditioning: PT/OT evaluated the patient and recommended HH.    Discharge Diagnoses:  Active Problems:   Paroxysmal atrial fibrillation (HCC)   Chronic anticoagulation   Sepsis due to pneumonia [facility])   History of stroke with residual deficit   Transient hypotension    Discharge Instructions  Discharge Instructions    Diet - low sodium heart healthy   Complete by: As directed    Discharge instructions   Complete by: As directed    1)Please take prescribed medications as instructed 2)Follow up with your PCP in 1 to 2 weeks 3)Follow up with your cardiologist in 2 weeks 4)Follow up with home health   Increase activity slowly   Complete by: As directed      Allergies as of 03/11/2020   No Known Allergies     Medication List    TAKE these medications   acetaminophen 325 MG tablet Commonly known as: TYLENOL Take 1-2 tablets (325-650 mg total) by mouth every 4 (four) hours as needed for mild pain.   apixaban 5 MG Tabs tablet Commonly known as: ELIQUIS Take 1 tablet (5 mg total) by mouth 2 (two) times daily.  bethanechol 25 MG tablet Commonly known as: URECHOLINE Take 1 tablet (25 mg total) by mouth 3 (three) times daily.   diltiazem 180 MG 24 hr capsule Commonly known as: CARDIZEM CD Take 1  capsule (180 mg total) by mouth daily. Start taking on: March 12, 2020   dofetilide 500 MCG capsule Commonly known as: TIKOSYN Take 1 capsule (500 mcg total) by mouth 2 (two) times daily.   doxycycline 100 MG tablet Commonly known as: VIBRA-TABS Take 1 tablet (100 mg total) by mouth every 12 (twelve) hours for 5 days.   feeding supplement (PRO-STAT SUGAR FREE 64) Liqd Take 30 mLs by mouth 2 (two) times daily.   guaiFENesin 600 MG 12 hr tablet Commonly known as: MUCINEX Take 1 tablet (600 mg total) by mouth 2 (two) times daily as needed.   metoprolol tartrate 50 MG tablet Commonly known as: LOPRESSOR Take 1 tablet (50 mg total) by mouth 3 (three) times daily. What changed:   medication strength  how much to take  when to take this   rosuvastatin 10 MG tablet Commonly known as: CRESTOR Take 1 tablet (10 mg total) by mouth daily after supper.   senna-docusate 8.6-50 MG tablet Commonly known as: Senokot-S Take 1 tablet by mouth at bedtime as needed for mild constipation.   tamsulosin 0.4 MG Caps capsule Commonly known as: FLOMAX Take 1 capsule (0.4 mg total) by mouth daily at 8 pm.   tiZANidine 2 MG tablet Commonly known as: ZANAFLEX Take 1 tablet (2 mg total) by mouth 3 (three) times daily.       Follow-up Information    [name], MD.   Specialty: Cardiology Why: Call to be seen 2 weeks after discharge from hospital Contact information: [place] [address] [phone]        [facility] Follow up.   Specialty: Rehabilitation Why: they will call you to schedule apt times, if you do not hear from them in 1-2 business days please call them.  Contact information: [address] [id number] mc [place] [phone]       Kerin Perna, NP. Schedule an appointment as soon as possible for a visit in 1 week(s).   Specialty: Internal Medicine Contact  information: [place] [address] [phone]              No Known Allergies  Consultations:  Cardiology   Procedures/Studies: DG Chest 2 View  Result Date: 03/06/2020 CLINICAL DATA:  Fevers for 2 days. EXAM: CHEST - 2 VIEW COMPARISON:  01/30/2020 FINDINGS: Normal heart size. No pleural effusion or edema identified. Airspace consolidation within the right midlung is identified concerning for pneumonia. This is a new finding compared with 01/30/2020. Visualized osseous structures are unremarkable. IMPRESSION: Right midlung airspace consolidation concerning for pneumonia. Followup PA and lateral chest X-ray is recommended in 3-4 weeks following trial of antibiotic therapy to ensure resolution and exclude underlying malignancy. Electronically Signed   By: Kerby Moors M.D.   On: 03/06/2020 08:15   DG Ankle Complete Right  Result Date: 03/01/2020 CLINICAL DATA:  Ankle swelling. EXAM: RIGHT ANKLE - COMPLETE 3+ VIEW COMPARISON:  None. FINDINGS: Widening of the ankle mortise laterally. Narrowing the ankle mortise medially. No acute fracture. Chronic fracture distal fibula. Degenerative change in the talonavicular joint. Ossicle posterior to the ankle joint. IMPRESSION: Chronic ankle injury with ankle instability.  No fracture. Electronically Signed   By: Franchot Gallo M.D.  On: 03/01/2020 15:18   DG Swallowing Func-Speech Pathology  Result Date: 02/16/2020 Objective Swallowing Evaluation: Type of Study: MBS-Modified Barium Swallow Study  Patient Details Name: Dustin Schneider MRN: [id number] Date of Birth: 1955/09/24 Today's Date: 02/16/2020 SLP Time Calculation (min) (ACUTE ONLY): 25 min Past Medical History: Past Medical History: Diagnosis Date . Hypertension  . NICM (nonischemic cardiomyopathy) ([place]) 06/15/2018  04/23/18: EF 45%, 09/15/18: NOrmal LVEF . Paroxysmal atrial fibrillation ([place]) 06/15/2018 . Visit for monitoring Tikosyn therapy 06/12/2018 Past Surgical History:  Past Surgical History: Procedure Laterality Date . CARDIOVERSION N/A 05/13/2018  Procedure: CARDIOVERSION;  Surgeon: [name], MD;  Location: [facility] ENDOSCOPY;  Service: Cardiovascular;  Laterality: N/A; . CARDIOVERSION N/A 06/13/2018  Procedure: CARDIOVERSION;  Surgeon: [name], MD;  Location: [facility] ENDOSCOPY;  Service: Cardiovascular;  Laterality: N/A; . CARDIOVERSION N/A 06/23/2018  Procedure: CARDIOVERSION;  Surgeon: [name], MD;  Location: [place];  Service: Cardiovascular;  Laterality: N/A; . excision of actinic keratosis   . EYE SURGERY    eye lift . IR CT HEAD LTD  01/27/2020 . IR PERCUTANEOUS ART THROMBECTOMY/INFUSION INTRACRANIAL INC DIAG ANGIO  01/27/2020    . IR PERCUTANEOUS ART THROMBECTOMY/INFUSION INTRACRANIAL INC DIAG ANGIO  01/27/2020 . RADIOLOGY WITH ANESTHESIA N/A 01/27/2020  Procedure: IR WITH ANESTHESIA;  Surgeon: Radiologist, Medication, MD;  Location: [place];  Service: Radiology;  Laterality: N/A; . TONSILLECTOMY   . WISDOM TOOTH EXTRACTION   HPI: Pt is a 64 yo male presenting iwth acute onset R sided weakness and aphasia. CT showed hyerdensity in the L M2 branch. Pt underwent emergent L MCA and ACA revascularization on 6/16. He was intubated 6/16-6/18. PMH includes: afib, cardiomyopathy, HTN. Pt admitted to [facility] 02/02/20.  Assessment / Plan / Recommendation CHL IP CLINICAL IMPRESSIONS 02/16/2020 Clinical Impression Mild-mod dysphagia still evidenced, however pt with improved oropharyngeal swallow function since last MBSS performed 02/02/20. Although trace lingual/palatal residue resulted in reduced bolus cohesion of thin barium trials, residue was sensed and pt automatically triggered additional swallow to  clear residue without airway intrusion. Trace lateral channel residue present, but when pt took small single cup sips of thin barium, no penetration of aspiration was observed, and swallow initiation was timely. Use of straw to transit thin barium resulted in a less timely swallow, with  initiation occurring at the level of the pyriform sinuses, ultimately leading to flash penetration and later frank aspiration of thin with a delayed cough response. Aspirate did appear to be cleared from vestibule in subsequent imaging, however not captured during cough response to definitively determine if it was ejected or simply traveled further down trachea. Pt consumed barium pill with thin via cup with adequate bolus cohesion and no airway intrusion, however would recommend conservative approach to medication administration due to potential for difficulty with dual consistencies given oral phase deficits than can lead to decreased bolus cohesion, increasing aspiration risk. Recommend pt continue dysphagia 3 solids, upgrade to thin liquids - single cup sips only, NO STRAWS, medication may be given whole in puree. ST will continue to follow to ensure diet safety, efficiency, and carryover of swallow strategies. SLP Visit Diagnosis Dysphagia, oropharyngeal phase (R13.12) Attention and concentration deficit following -- Frontal lobe and executive function deficit following -- Impact on safety and function Mild aspiration risk   CHL IP TREATMENT RECOMMENDATION 02/02/2020 Treatment Recommendations Therapy as outlined in treatment plan below   Prognosis 01/29/2020 Prognosis for Safe Diet Advancement Good Barriers to Reach Goals Language deficits Barriers/Prognosis Comment -- CHL IP DIET RECOMMENDATION  02/16/2020 SLP Diet Recommendations Dysphagia 3 (Mech soft) solids;Thin liquid Liquid Administration via Cup;No straw Medication Administration Whole meds with puree Compensations Slow rate;Small sips/bites Postural Changes Remain semi-upright after after feeds/meals (Comment);Seated upright at 90 degrees   CHL IP OTHER RECOMMENDATIONS 02/16/2020 Recommended Consults -- Oral Care Recommendations Oral care BID Other Recommendations --   CHL IP FOLLOW UP RECOMMENDATIONS 02/02/2020 Follow up Recommendations Inpatient Rehab   CHL  IP FREQUENCY AND DURATION 02/02/2020 Speech Therapy Frequency (ACUTE ONLY) min 2x/week Treatment Duration 2 weeks      CHL IP ORAL PHASE 02/16/2020 Oral Phase Impaired Oral - Pudding Teaspoon -- Oral - Pudding Cup -- Oral - Honey Teaspoon -- Oral - Honey Cup -- Oral - Nectar Teaspoon -- Oral - Nectar Cup NT Oral - Nectar Straw NT Oral - Thin Teaspoon -- Oral - Thin Cup Decreased bolus cohesion;Lingual/palatal residue Oral - Thin Straw Decreased bolus cohesion;Premature spillage Oral - Puree NT Oral - Mech Soft NT Oral - Regular -- Oral - Multi-Consistency -- Oral - Pill WFL Oral Phase - Comment --  CHL IP PHARYNGEAL PHASE 02/16/2020 Pharyngeal Phase Impaired Pharyngeal- Pudding Teaspoon -- Pharyngeal -- Pharyngeal- Pudding Cup -- Pharyngeal -- Pharyngeal- Honey Teaspoon -- Pharyngeal -- Pharyngeal- Honey Cup -- Pharyngeal -- Pharyngeal- Nectar Teaspoon -- Pharyngeal -- Pharyngeal- Nectar Cup NT Pharyngeal -- Pharyngeal- Nectar Straw NT Pharyngeal -- Pharyngeal- Thin Teaspoon -- Pharyngeal -- Pharyngeal- Thin Cup Lateral channel residue Pharyngeal Material does not enter airway Pharyngeal- Thin Straw Delayed swallow initiation-pyriform sinuses;Reduced airway/laryngeal closure;Penetration/Aspiration before swallow;Inter-arytenoid space residue;Lateral channel residue Pharyngeal Material enters airway, passes BELOW cords and not ejected out despite cough attempt by patient Pharyngeal- Puree NT Pharyngeal -- Pharyngeal- Mechanical Soft NT Pharyngeal -- Pharyngeal- Regular -- Pharyngeal -- Pharyngeal- Multi-consistency -- Pharyngeal -- Pharyngeal- Pill WFL Pharyngeal -- Pharyngeal Comment --  CHL IP CERVICAL ESOPHAGEAL PHASE 02/16/2020 Cervical Esophageal Phase WFL Pudding Teaspoon -- Pudding Cup -- Honey Teaspoon -- Honey Cup -- Nectar Teaspoon -- Nectar Cup -- Nectar Straw -- Thin Teaspoon -- Thin Cup -- Thin Straw -- Puree -- Mechanical Soft -- Regular -- Multi-consistency -- Pill -- Cervical Esophageal Comment -- Arbutus Leas 02/16/2020, 9:57 AM              ECHO TEE  Result Date: 03/11/2020    TRANSESOPHOGEAL ECHO REPORT   Patient Name:   Dustin Schneider Date of Exam: 03/11/2020 Medical Rec #:  [id number]        Height:       69.0 in Accession #:    [id number]       Weight:       167.5 lb Date of Birth:  1955/10/20        BSA:          1.916 m Patient Age:    78 years         BP:           94/72 mmHg Patient Gender: M                HR:           76 bpm. Exam Location:  Inpatient Procedure: Transesophageal Echo, Cardiac Doppler and Color Doppler Indications:     Atrial fibrillation  History:         Patient has prior history of Echocardiogram examinations, most                  recent 01/28/2020. Stroke, Arrythmias:Atrial Fibrillation; Risk  Factors:Hypertension.  Sonographer:     Dustin Flock RDCS Referring Phys:  [place] Diagnosing Phys: [name] MD PROCEDURE: The transesophogeal probe was passed without difficulty through the esophogus of the patient. Sedation performed by different physician. The patient was monitored while under deep sedation. Anesthestetic sedation was provided intravenously by Anesthesiology: 151.6mg  of Propofol. The patient developed no complications during the procedure. IMPRESSIONS  1. Left ventricular ejection fraction, by estimation, is 60 to 65%. The left ventricle has normal function. The left ventricle has no regional wall motion abnormalities.  2. Right ventricular systolic function is normal. The right ventricular size is normal.  3. No left atrial/left atrial appendage thrombus was detected.  4. The mitral valve is normal in structure. Mild mitral valve regurgitation.  5. The aortic valve is tricuspid. Aortic valve regurgitation is mild.  6. Agitated saline contrast bubble study was negative, with no evidence of any interatrial shunt. FINDINGS  Left Ventricle: Left ventricular ejection fraction, by estimation, is 60 to 65%. The left ventricle has normal function. The  left ventricle has no regional wall motion abnormalities. The left ventricular internal cavity size was normal in size. There is  no left ventricular hypertrophy. Right Ventricle: The right ventricular size is normal. No increase in right ventricular wall thickness. Right ventricular systolic function is normal. Left Atrium: Left atrial size was normal in size. No left atrial/left atrial appendage thrombus was detected. Right Atrium: Right atrial size was normal in size. Pericardium: There is no evidence of pericardial effusion. Mitral Valve: The mitral valve is normal in structure. Mild mitral valve regurgitation. Tricuspid Valve: The tricuspid valve is normal in structure. Tricuspid valve regurgitation is trivial. Aortic Valve: The aortic valve is tricuspid. Aortic valve regurgitation is mild. Pulmonic Valve: The pulmonic valve was normal in structure. Pulmonic valve regurgitation is trivial. Aorta: The aortic root is normal in size and structure and the aortic root, ascending aorta and aortic arch are all structurally normal, with no evidence of dilitation or obstruction. There is minimal (Grade I) plaque. Venous: The inferior vena cava was not well visualized. IAS/Shunts: No atrial level shunt detected by color flow Doppler. Agitated saline contrast was given intravenously to evaluate for intracardiac shunting. Agitated saline contrast bubble study was negative, with no evidence of any interatrial shunt. There  is no evidence of a patent foramen ovale. [name] MD Electronically signed by [name] MD Signature Date/Time: 03/11/2020/9:34:58 AM    Final        Subjective: Patient seen and examined at the bedside today.  Hemodynamically stable for discharge  Discharge Exam: Vitals:   03/11/20 0952 03/11/20 0956  BP: 111/69 111/69  Pulse: 76   Resp:    Temp:    SpO2:     Vitals:   03/11/20 0825 03/11/20 0833 03/11/20 0952 03/11/20 0956  BP: 107/75 (!) 103/87 111/69 111/69  Pulse: 78 50 76    Resp: 23 16    Temp:  98.3 F (36.8 C)    TempSrc:  Oral    SpO2: 96% 94%    Weight:      Height:        General: Pt is alert, awake, not in acute distress Cardiovascular: RRR, S1/S2 +, no rubs, no gallops Respiratory: CTA bilaterally, no wheezing, no rhonchi Abdominal: Soft, NT, ND, bowel sounds + Extremities: no edema, no cyanosis    The results of significant diagnostics from this hospitalization (including imaging, microbiology, ancillary and laboratory) are listed below for reference.  Microbiology: Recent Results (from the past 240 hour(s))  SARS Coronavirus 2 by RT PCR (hospital order, performed in [facility] hospital lab) Nasopharyngeal Nasopharyngeal Swab     Status: None   Collection Time: 03/06/20  7:40 AM   Specimen: Nasopharyngeal Swab  Result Value Ref Range Status   SARS Coronavirus 2 NEGATIVE NEGATIVE Final    Comment: (NOTE) SARS-CoV-2 target nucleic acids are NOT DETECTED.  The SARS-CoV-2 RNA is generally detectable in upper and lower respiratory specimens during the acute phase of infection. The lowest concentration of SARS-CoV-2 viral copies this assay can detect is 250 copies / mL. A negative result does not preclude SARS-CoV-2 infection and should not be used as the sole basis for treatment or other patient management decisions.  A negative result may occur with improper specimen collection / handling, submission of specimen other than nasopharyngeal swab, presence of viral mutation(s) within the areas targeted by this assay, and inadequate number of viral copies (<250 copies / mL). A negative result must be combined with clinical observations, patient history, and epidemiological information.  Fact Sheet for Patients:   StrictlyIdeas.no  Fact Sheet for Healthcare Providers: BankingDealers.co.za  This test is not yet approved or  cleared by the [place] FDA and has been authorized for  detection and/or diagnosis of SARS-CoV-2 by FDA under an Emergency Use Authorization (EUA).  This EUA will remain in effect (meaning this test can be used) for the duration of the COVID-19 declaration under Section 564(b)(1) of the Act, 21 U.S.C. section 360bbb-3(b)(1), unless the authorization is terminated or revoked sooner.  Performed at [facility], [place] [address], [place], [place] [address]   Blood culture (routine x 2)     Status: None (Preliminary result)   Collection Time: 03/06/20 10:26 AM   Specimen: BLOOD  Result Value Ref Range Status   Specimen Description BLOOD RIGHT ANTECUBITAL  Final   Special Requests   Final    BOTTLES DRAWN AEROBIC AND ANAEROBIC Blood Culture adequate volume   Culture   Final    NO GROWTH 4 DAYS Performed at [facility], [place] [address] [place], [place] [address]    Report Status PENDING  Incomplete  Blood culture (routine x 2)     Status: None (Preliminary result)   Collection Time: 03/06/20 10:26 AM   Specimen: BLOOD LEFT WRIST  Result Value Ref Range Status   Specimen Description BLOOD LEFT WRIST  Final   Special Requests   Final    BOTTLES DRAWN AEROBIC AND ANAEROBIC Blood Culture adequate volume   Culture   Final    NO GROWTH 4 DAYS Performed at [facility], [place] [address], [place], [place] [address]    Report Status PENDING  Incomplete  MRSA PCR Screening     Status: None   Collection Time: 03/06/20 11:55 AM   Specimen: Nasal Mucosa; Nasopharyngeal  Result Value Ref Range Status   MRSA by PCR NEGATIVE NEGATIVE Final    Comment:        The GeneXpert MRSA Assay (FDA approved for NASAL specimens only), is one component of a comprehensive MRSA colonization surveillance program. It is not intended to diagnose MRSA infection nor to guide or monitor treatment for MRSA infections. Performed at [facility], [place] [address] [place], [place] [address]      Labs: BNP (last 3 results) No results for  input(s): BNP in the last 8760 hours. Basic Metabolic Panel: Recent Labs  Lab  03/06/20 9379 03/06/20 1342 03/07/20 0434 03/09/20 0656 03/10/20 0421 03/11/20 0138  NA 138  --  138 138 137 139  K 4.3  --  4.2 3.8 4.5 4.1  CL 99  --  106 102 104 103  CO2 26  --  24 25 24 26   GLUCOSE 136*  --  131* 116* 107* 111*  BUN 10  --  9 10 13 12   CREATININE 1.04  --  1.02 0.79 0.90 0.85  CALCIUM 9.0  --  8.3* 8.3* 8.4* 8.6*  MG  --  1.9  --  2.3 2.3 2.1   Liver Function Tests: Recent Labs  Lab 03/06/20 0733  AST 22  ALT 31  ALKPHOS 75  BILITOT 0.9  PROT 6.4*  ALBUMIN 2.9*   No results for input(s): LIPASE, AMYLASE in the last 168 hours. No results for input(s): AMMONIA in the last 168 hours. CBC: Recent Labs  Lab 03/06/20 0733 03/07/20 0434 03/09/20 0656  WBC 12.4* 9.2 5.7  NEUTROABS 10.5*  --  3.8  HGB 13.2 11.3* 11.3*  HCT 40.6 34.8* 34.7*  MCV 97.6 96.9 94.6  PLT 201 180 234   Cardiac Enzymes: No results for input(s): CKTOTAL, CKMB, CKMBINDEX, TROPONINI in the last 168 hours. BNP: Invalid input(s): POCBNP CBG: No results for input(s): GLUCAP in the last 168 hours. D-Dimer No results for input(s): DDIMER in the last 72 hours. Hgb A1c No results for input(s): HGBA1C in the last 72 hours. Lipid Profile No results for input(s): CHOL, HDL, LDLCALC, TRIG, CHOLHDL, LDLDIRECT in the last 72 hours. Thyroid function studies No results for input(s): TSH, T4TOTAL, T3FREE, THYROIDAB in the last 72 hours.  Invalid input(s): FREET3 Anemia work up No results for input(s): VITAMINB12, FOLATE, FERRITIN, TIBC, IRON, RETICCTPCT in the last 72 hours. Urinalysis    Component Value Date/Time   COLORURINE AMBER (A) 03/06/2020 1024   APPEARANCEUR CLOUDY (A) 03/06/2020 1024   LABSPEC 1.032 (H) 03/06/2020 1024   PHURINE 5.0 03/06/2020 1024   GLUCOSEU NEGATIVE 03/06/2020 1024   HGBUR MODERATE (A) 03/06/2020 1024   BILIRUBINUR SMALL (A) 03/06/2020 1024   KETONESUR NEGATIVE  03/06/2020 1024   PROTEINUR 100 (A) 03/06/2020 1024   NITRITE POSITIVE (A) 03/06/2020 1024   LEUKOCYTESUR MODERATE (A) 03/06/2020 1024   Sepsis Labs Invalid input(s): PROCALCITONIN,  WBC,  LACTICIDVEN Microbiology Recent Results (from the past 240 hour(s))  SARS Coronavirus 2 by RT PCR (hospital order, performed in [place] hospital lab) Nasopharyngeal Nasopharyngeal Swab     Status: None   Collection Time: 03/06/20  7:40 AM   Specimen: Nasopharyngeal Swab  Result Value Ref Range Status   SARS Coronavirus 2 NEGATIVE NEGATIVE Final    Comment: (NOTE) SARS-CoV-2 target nucleic acids are NOT DETECTED.  The SARS-CoV-2 RNA is generally detectable in upper and lower respiratory specimens during the acute phase of infection. The lowest concentration of SARS-CoV-2 viral copies this assay can detect is 250 copies / mL. A negative result does not preclude SARS-CoV-2 infection and should not be used as the sole basis for treatment or other patient management decisions.  A negative result may occur with improper specimen collection / handling, submission of specimen other than nasopharyngeal swab, presence of viral mutation(s) within the areas targeted by this assay, and inadequate number of viral copies (<250 copies / mL). A negative result must be combined with clinical observations, patient history, and epidemiological information.  Fact Sheet for Patients:   StrictlyIdeas.no  Fact Sheet for Healthcare Providers:  BankingDealers.co.za  This test is not yet approved or  cleared by the [place] and has been authorized for detection and/or diagnosis of SARS-CoV-2 by FDA under an Emergency Use Authorization (EUA).  This EUA will remain in effect (meaning this test can be used) for the duration of the COVID-19 declaration under Section 564(b)(1) of the Act, 21 U.S.C. section 360bbb-3(b)(1), unless the authorization is terminated  or revoked sooner.  Performed at [facility], [place] [address], [place], [place] [address]   Blood culture (routine x 2)     Status: None (Preliminary result)   Collection Time: 03/06/20 10:26 AM   Specimen: BLOOD  Result Value Ref Range Status   Specimen Description BLOOD RIGHT ANTECUBITAL  Final   Special Requests   Final    BOTTLES DRAWN AEROBIC AND ANAEROBIC Blood Culture adequate volume   Culture   Final    NO GROWTH 4 DAYS Performed at [facility], [place] [address], [place], [place] [address]    Report Status PENDING  Incomplete  Blood culture (routine x 2)     Status: None (Preliminary result)   Collection Time: 03/06/20 10:26 AM   Specimen: BLOOD LEFT WRIST  Result Value Ref Range Status   Specimen Description BLOOD LEFT WRIST  Final   Special Requests   Final    BOTTLES DRAWN AEROBIC AND ANAEROBIC Blood Culture adequate volume   Culture   Final    NO GROWTH 4 DAYS Performed at [facility], [place] [address] [place], [place] [address]    Report Status PENDING  Incomplete  MRSA PCR Screening     Status: None   Collection Time: 03/06/20 11:55 AM   Specimen: Nasal Mucosa; Nasopharyngeal  Result Value Ref Range Status   MRSA by PCR NEGATIVE NEGATIVE Final    Comment:        The GeneXpert MRSA Assay (FDA approved for NASAL specimens only), is one component of a comprehensive MRSA colonization surveillance program. It is not intended to diagnose MRSA infection nor to guide or monitor treatment for MRSA infections. Performed at [facility], [place] [address] [place], [place] [address]     Please note: You were cared for by a hospitalist during your hospital stay. Once you are discharged, your primary care physician will handle any further medical issues. Please note that NO REFILLS for any discharge medications will be authorized once you are discharged, as it is imperative that you return to your primary care physician (or establish a  relationship with a primary care physician if you do not have one) for your post hospital discharge needs so that they can reassess your need for medications and monitor your lab values.    Time coordinating discharge: 40 minutes  SIGNED:   Shelly Coss, MD  [facility] 03/11/2020, 11:51 AM Pager [phone]  If 7PM-7AM, please contact night-coverage [email or web address] Password [email or web address]

## 2020-03-14 ENCOUNTER — Encounter (HOSPITAL_COMMUNITY): Payer: Self-pay | Admitting: Cardiology

## 2020-03-14 ENCOUNTER — Telehealth: Payer: Self-pay

## 2020-03-14 NOTE — Telephone Encounter (Signed)
Transition Care Management Follow-up Telephone Call Date of discharge and from where: 03/11/2020, [facility].  Call placed to patient.  His POA, Harrell Gave, answered and explained that the patient will be establishing care with a new provider. Juluis Mire, NP removed as PCP in Epic.

## 2020-03-16 ENCOUNTER — Encounter: Payer: Self-pay | Admitting: Physical Therapy

## 2020-03-16 ENCOUNTER — Ambulatory Visit: Payer: BC Managed Care – PPO | Attending: Physician Assistant | Admitting: Physical Therapy

## 2020-03-16 ENCOUNTER — Other Ambulatory Visit: Payer: Self-pay

## 2020-03-16 ENCOUNTER — Telehealth: Payer: Self-pay

## 2020-03-16 DIAGNOSIS — R262 Difficulty in walking, not elsewhere classified: Secondary | ICD-10-CM | POA: Diagnosis present

## 2020-03-16 DIAGNOSIS — R4701 Aphasia: Secondary | ICD-10-CM | POA: Insufficient documentation

## 2020-03-16 DIAGNOSIS — R2689 Other abnormalities of gait and mobility: Secondary | ICD-10-CM | POA: Insufficient documentation

## 2020-03-16 DIAGNOSIS — R482 Apraxia: Secondary | ICD-10-CM | POA: Diagnosis present

## 2020-03-16 DIAGNOSIS — R278 Other lack of coordination: Secondary | ICD-10-CM | POA: Diagnosis present

## 2020-03-16 DIAGNOSIS — R2681 Unsteadiness on feet: Secondary | ICD-10-CM

## 2020-03-16 DIAGNOSIS — I69351 Hemiplegia and hemiparesis following cerebral infarction affecting right dominant side: Secondary | ICD-10-CM

## 2020-03-16 DIAGNOSIS — R29818 Other symptoms and signs involving the nervous system: Secondary | ICD-10-CM | POA: Insufficient documentation

## 2020-03-16 DIAGNOSIS — M6281 Muscle weakness (generalized): Secondary | ICD-10-CM | POA: Diagnosis present

## 2020-03-16 NOTE — Telephone Encounter (Signed)
Let him know that I have discussed with Dr. Terri Skains and in fact he saw him multiple times in the hospital. THis is a one time visit to make sure all is well and he has a f/u appointment with me soon as well. Change his appointment with me to 3-4 weeks from now

## 2020-03-16 NOTE — Telephone Encounter (Signed)
Dustin Schneider was called.   Understood, will keep 8/10 appt with Dr Terri Skains. Wants you to know he is extremely disappointed.    Thank you   -Leda Quail

## 2020-03-16 NOTE — Therapy (Signed)
[facility] [address] [place], [place], [address] Phone: [phone]   Fax:  [phone]  Physical Therapy Evaluation  Patient Details  Name: Dustin Schneider MRN: [id number] Date of Birth: 12-01-1955 Referring Provider (PT): Lauraine Rinne, PA-C (will be followed by Dr. Posey Pronto)   Encounter Date: 03/16/2020   PT End of Session - 03/16/20 1109    Visit Number 1    Number of Visits 25    Date for PT Re-Evaluation 06/14/20    Authorization Type BCBS    PT Start Time 0800    PT Stop Time 0847    PT Time Calculation (min) 47 min    Equipment Utilized During Treatment Gait belt    Activity Tolerance Patient tolerated treatment well    Behavior During Therapy [facility] for tasks assessed/performed           Past Medical History:  Diagnosis Date  . Hypertension   . NICM (nonischemic cardiomyopathy) ([place]) 06/15/2018   04/23/18: EF 45%, 09/15/18: NOrmal LVEF  . Paroxysmal atrial fibrillation ([place]) 06/15/2018  . Stroke ([place])   . Visit for monitoring Tikosyn therapy 06/12/2018    Past Surgical History:  Procedure Laterality Date  . BUBBLE STUDY  03/11/2020   Procedure: BUBBLE STUDY;  Surgeon: [name], MD;  Location: [place];  Service: Cardiovascular;;  . CARDIOVERSION N/A 05/13/2018   Procedure: CARDIOVERSION;  Surgeon: [name], MD;  Location: [facility] ENDOSCOPY;  Service: Cardiovascular;  Laterality: N/A;  . CARDIOVERSION N/A 06/13/2018   Procedure: CARDIOVERSION;  Surgeon: [name], MD;  Location: [facility] ENDOSCOPY;  Service: Cardiovascular;  Laterality: N/A;  . CARDIOVERSION N/A 06/23/2018   Procedure: CARDIOVERSION;  Surgeon: [name], MD;  Location: [facility] ENDOSCOPY;  Service: Cardiovascular;  Laterality: N/A;  . CARDIOVERSION N/A 03/11/2020   Procedure: CARDIOVERSION;  Surgeon: [name], MD;  Location: [facility];  Service: Cardiovascular;  Laterality: N/A;  . excision of actinic keratosis    . EYE SURGERY      eye lift  . IR CT HEAD LTD  01/27/2020  . IR PERCUTANEOUS ART THROMBECTOMY/INFUSION INTRACRANIAL INC DIAG ANGIO  01/27/2020      . IR PERCUTANEOUS ART THROMBECTOMY/INFUSION INTRACRANIAL INC DIAG ANGIO  01/27/2020  . RADIOLOGY WITH ANESTHESIA N/A 01/27/2020   Procedure: IR WITH ANESTHESIA;  Surgeon: Radiologist, Medication, MD;  Location: [facility];  Service: Radiology;  Laterality: N/A;  . TEE WITHOUT CARDIOVERSION N/A 03/11/2020   Procedure: TRANSESOPHAGEAL ECHOCARDIOGRAM (TEE);  Surgeon: [name], MD;  Location: [place] [place];  Service: Cardiovascular;  Laterality: N/A;  . TONSILLECTOMY    . WISDOM TOOTH EXTRACTION      There were no vitals filed for this visit.    Subjective Assessment - 03/16/20 0804    Subjective 64 y.o. male with history of paroxysmal A. fib on chronic Xarelto, nonischemic cardiomyopathy and hypertension.  Patient was at [facility] hardware today.  Patient was noted to come out of the bathroom and suddenly collapsed by bystanders. On arrival they noted that he had a left forced gaze and right hemiplegia.  Patient was also nauseous and vomiting. of head and neck was immediately obtained and showed a dense left MCA. Pt underwent emergent L MCA and ACA revascularization on 6/16. Received inpatient rehab and was discharged home 03/02/20. Was re-admitted to hospital on 7/25 a with 2-day history of fever.  He was just discharged from rehab to home. Patient was admitted for the management of community acquired pneumonia, then returned back home on  the 30th. Family has been working on PROM of R arm and R leg to be working on at home.    Patient is accompained by: Family member   [name]   Pertinent History PMH: HTN, paroxysmal a fib    Limitations Standing;Walking    Patient Stated Goals "wants everything working on R side again"    Currently in Pain? No/denies              [facility] PT Assessment - 03/16/20 0809      Assessment   Medical Diagnosis L MCA CVA    Referring Provider (PT)  Lauraine Rinne, PA-C   will be followed by Dr. Posey Pronto   [place] Date/Surgical Date 01/27/20    Hand Dominance Right    Next MD Visit 03/17/20 with Dr. Posey Pronto    Prior Therapy CIR with PT, OT, ST      Precautions   Precautions Fall    Precaution Comments dense R hemiparesis      Balance Screen   Has the patient fallen in the past 6 months No    Has the patient had a decrease in activity level because of a fear of falling?  No    Is the patient reluctant to leave their home because of a fear of falling?  No      Home Social worker Private residence    Living Arrangements Other relatives;Spouse/significant other;Other (Comment)   partner lives a couple blocks away but can come and help   Available Help at Discharge Family;Other (Comment)   significant other   Type of [place] One level    Home Equipment Wheelchair - manual;Tub bench;Bedside commode;Hospital bed    Additional Comments has a 2 step step down from entrance to living room, working on getting a ramp. 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 pt with aphasia - does best with yes/no questions, pt can answer simple questions and follow commands      Sensation   Light Touch Impaired by gross assessment;Impaired Detail    Light Touch Impaired Details Impaired RLE    Proprioception Impaired by gross assessment;Impaired Detail    Proprioception Impaired Details Impaired RLE   3/5 correct at B ankle   Additional Comments can detect light touch on RLE and deep pressure, pt reporting it feels  different than LLE, difficult to assess proprioception due to aphasia      Coordination   Gross Motor Movements are Fluid and Coordinated No    Heel Shin Test WFL LLE, unable to perform on RLE secondary to hemiparesis      Posture/Postural Control   Posture/Postural Control Postural limitations    Postural Limitations Rounded Shoulders;Posterior pelvic tilt      Tone   Assessment Location --      ROM / Strength   AROM / PROM / Strength Strength;PROM      PROM   Overall PROM Comments decr R hamstring PROM in sitting  Strength   Overall Strength Comments grossly 5/5 LLE MMT    Strength Assessment Site Hip;Knee;Ankle    Right/Left Hip Right;Left    Right Hip Flexion 2+/5    Right/Left Knee Right    Right Knee Flexion 2+/5    Right Knee Extension 3-/5    Right/Left Ankle Right    Right Ankle Dorsiflexion 1/5    Right Ankle Plantar Flexion 0/5      Bed Mobility   Supine to Sit Minimal Assistance - Patient > 75%   with RLE/RUE management    Sit to Supine Supervision/Verbal cueing      Transfers   Transfers Squat Pivot Transfers    Squat Pivot Transfers 4: Min assist    Squat Pivot Transfer Details (indicate cue type and reason) from w/c <> mat table, therapist blocking R knee, when transferring over to mat pt holding onto therapist's arm with LUE    Comments bed mobility: showed pt's brother in law to assist pt with bed mobility by bringing RLE into hooklying position for incr ease of rolling due to pt performing with RLE extended      Ambulation/Gait   Gait Comments not assessed today during eval, at disharge from IPR (03/02/20): mod +2 to ambulate 45ft with RW and/or handrail with total A to advance R LE, facilitate weight shifting, and control knee flexion/hyperextension.                       Objective measurements completed on examination: See above findings.               PT Education - 03/16/20 1100    Education Details clinical findings,  POC    Person(s) Educated Patient   pt's brother in Sports coach, [name]   Methods Explanation    Comprehension Verbalized understanding            PT Short Term Goals - 03/16/20 1136      PT SHORT TERM GOAL #1   Title Pt and pt's family members will be independent with initial HEP in order to build upon functional gains made in therapy. ALL LTGS DUE 04/13/20    Time 4    Period Weeks    Status New    Target Date 04/13/20      PT SHORT TERM GOAL #2   Title Pt will perform sit > supine with supervision and supine > sit with min guard for bed mobility in order to decr caregiver burden.    Time 4    Period Weeks    Status New      PT SHORT TERM GOAL #3   Title Pt will undergo assessment of AFO for functional transfers and gait.    Time 4    Period Weeks    Status New      PT SHORT TERM GOAL #4   Title Pt will ambulate 47' with RW with mod A x1 in order to improve functional mobility.    Time 4    Period Weeks    Status New      PT SHORT TERM GOAL #5   Title Pt will perform sit <> stand with RW vs. Stedy with min A in order to improve functional BLE strength for transfers.    Time 4    Period Weeks    Status New      Additional Short Term Goals   Additional Short Term Goals Yes      PT SHORT  TERM GOAL #6   Title Pt will perform squat pivot transfers from w/c <> mat table with min guard.    Baseline min A for blocking R knee    Time 4    Period Weeks    Status New             PT Long Term Goals - 03/16/20 1139      PT LONG TERM GOAL #1   Title Pt and pt's family members will be independent with final HEP in order to build upon functional gains made in therapy. ALL LTGS DUE 06/08/20    Time 12    Period Weeks    Status New    Target Date 06/08/20      PT LONG TERM GOAL #2   Title Pt will ambulate 115' with min guard/min A with RW and R AFO in order to improve household mobility.    Time 12    Period Weeks    Status New      PT LONG TERM GOAL #3   Title Pt will  perform sit <> stand with RW with min guard in order to improve functional BLE strength for transfers.    Time 12    Period Weeks    Status New      PT LONG TERM GOAL #4   Title Pt will perform squat pivot transfer with mod I vs. stand pivot transfer with RW with min guard in order to decr caregiver burden.    Time 12    Period Weeks    Status New                  Plan - 03/16/20 1111    Clinical Impression Statement Patient is a 63 year old male referred to Neuro OPPT s/p dense L MCA CVA. [name] PMH is significant for: paroxysmal A. fib on chronic Xarelto, nonischemic cardiomyopathy and hypertension.  Pt admitted to hospital on 01/27/20 with L forced gaze, aphasia, and R hemiplegia. Pt received inpatient rehab and was discharged home on 03/02/20. Pt then re-admitted to hospital on 03/06/20 with 2 day hx of fever and was treated for pneumonia. Returned back home on the 30th. The following deficits were present during the exam:  aphasia, apraxia, decr sensation, decr coordination, impaired posture/trunk control, decr RLE strength, decr balance, impaired functional transfers and bed mobility, decr ROM, impaired timing and sequencing. Pt requiring min guard/min A for bed mobility for RLE management and min A for transfers in order to block RLE. Not able to assess gait today during eval - at discharge from IPR pt required mod +2 to ambulate 69ft with RW and/or handrail with total A to advance R LE, facilitate weight shifting, and control knee flexion/hyperextension. Pt would benefit from skilled PT to address these impairments and functional limitations to maximize functional mobility independence. Prior to hospitalization, patient was independent  with mobility and lived with Alone in a House home.    Personal Factors and Comorbidities Comorbidity 3+    Comorbidities dense L MCA CVA, paroxysmal A. fib on chronic Xarelto, nonischemic cardiomyopathy and hypertension.    Examination-Activity  Limitations Bathing;Bed Mobility;Bend;Dressing;Hygiene/Grooming;Stand;Squat;Locomotion Level;Transfers    Examination-Participation Restrictions Community Activity;Yard Work;Shop   walking his dog, works as an Electrical engineer Evolving/Moderate complexity    Clinical Decision Making Moderate    Rehab Potential Good    PT Frequency 2x / week    PT Duration 12 weeks    PT  Treatment/Interventions ADLs/Self Care Home Management;Aquatic Therapy;DME Instruction;Gait training;Stair training;Functional mobility training;Therapeutic activities;Therapeutic exercise;Electrical Stimulation;Balance training;Neuromuscular re-education;Wheelchair mobility training;Orthotic Fit/Training;Patient/family education;Passive range of motion;Energy conservation    PT Next Visit Plan initial supine HEP for strengthening/stretching with assist of family. NMR for weight shifting towards R in sitting, standing/sit <> stands with Stedy. when pt discharged from IPR:mod +2 to ambulate 16ft with RW and/or handrail with total A to advance R LE, facilitate weight shifting, and control knee flexion/hyperextension.    Consulted and Agree with Plan of Care Patient;Family member/caregiver    Family Member Consulted brother in Art gallery manager           Patient will benefit from skilled therapeutic intervention in order to improve the following deficits and impairments:  Abnormal gait, Decreased activity tolerance, Decreased balance, Decreased cognition, Decreased mobility, Decreased coordination, Decreased range of motion, Decreased endurance, Decreased strength, Hypomobility, Difficulty walking, Impaired UE functional use, Impaired vision/preception, Postural dysfunction, Impaired sensation  Visit Diagnosis: Hemiplegia and hemiparesis following cerebral infarction affecting right dominant side (HCC)  Unsteadiness on feet  Muscle weakness (generalized)  Other symptoms and signs involving the  nervous system  Other abnormalities of gait and mobility  Difficulty in walking, not elsewhere classified     Problem List Patient Active Problem List   Diagnosis Date Noted  . Sepsis due to pneumonia ([place]) 03/06/2020  . History of stroke with residual deficit 03/06/2020  . Transient hypotension 03/06/2020  . Spastic hemiplegia affecting nondominant side ([place])   . History of hypertension   . Expressive aphasia   . AKI (acute kidney injury) ([place])   . Global aphasia   . Hypoalbuminemia due to protein-calorie malnutrition ([place])   . Dysphagia, post-stroke   . Hyperlipidemia 02/02/2020  . Dysphagia following cerebral infarction 02/02/2020  . Aspiration pneumonia ([place]) 02/02/2020  . Acute ischemic left MCA stroke ([place]) 02/02/2020  . Acute respiratory failure ([place])   . Acute ischemic stroke [facility]) s/p clot retrieval L MCA & ACA A3 01/27/2020  . Aspiration into airway 01/27/2020  . Chronic anticoagulation 01/27/2020  . Paroxysmal atrial fibrillation ([place]) 06/15/2018  . Essential hypertension 06/15/2018  . NICM (nonischemic cardiomyopathy) ([place]) 06/15/2018  . Visit for monitoring Tikosyn therapy 06/12/2018    Arliss Journey, PT, DPT  03/16/2020, 11:44 AM  [facility] [address] [place], [place], [address] Phone: [phone]   Fax:  [phone]  Name: Dustin Schneider MRN: [id number] Date of Birth: 1955-10-16

## 2020-03-17 ENCOUNTER — Encounter: Payer: Self-pay | Admitting: Physical Medicine & Rehabilitation

## 2020-03-17 ENCOUNTER — Encounter
Payer: BC Managed Care – PPO | Attending: Physical Medicine & Rehabilitation | Admitting: Physical Medicine & Rehabilitation

## 2020-03-17 ENCOUNTER — Other Ambulatory Visit: Payer: Self-pay

## 2020-03-17 VITALS — BP 120/75 | HR 66 | Temp 98.8°F | Ht 68.0 in | Wt 170.0 lb

## 2020-03-17 DIAGNOSIS — N319 Neuromuscular dysfunction of bladder, unspecified: Secondary | ICD-10-CM | POA: Diagnosis present

## 2020-03-17 DIAGNOSIS — G8110 Spastic hemiplegia affecting unspecified side: Secondary | ICD-10-CM | POA: Diagnosis present

## 2020-03-17 DIAGNOSIS — R4701 Aphasia: Secondary | ICD-10-CM | POA: Insufficient documentation

## 2020-03-17 DIAGNOSIS — I69391 Dysphagia following cerebral infarction: Secondary | ICD-10-CM | POA: Diagnosis not present

## 2020-03-17 DIAGNOSIS — R269 Unspecified abnormalities of gait and mobility: Secondary | ICD-10-CM | POA: Diagnosis present

## 2020-03-17 DIAGNOSIS — I639 Cerebral infarction, unspecified: Secondary | ICD-10-CM | POA: Insufficient documentation

## 2020-03-17 DIAGNOSIS — I10 Essential (primary) hypertension: Secondary | ICD-10-CM

## 2020-03-17 MED ORDER — BETHANECHOL CHLORIDE 25 MG PO TABS: 12.5000 mg | ORAL_TABLET | Freq: Three times a day (TID) | ORAL | 1 refills | Status: DC

## 2020-03-17 NOTE — Progress Notes (Signed)
Subjective:    Patient ID: Dustin Schneider, male    DOB: 1956/03/22, 64 y.o.   MRN: [id number]  HPI Male with history of PAF/on Xarelto, and ICM presents for hospital follow up for left MCA infarct.    Brother-in-law provides history due to aphasia.  Since discharge, pt was admitted back to the hospital for PNA and had cardioversion for Afib, notes reviewed.  Since that time, denies issues swallowing, no coughing.  He is eating well. He has a follow up appointment with Neurology. He sees Cards next week. He is going to see PCP in a couple of weeks. He is wearing his orthosis at night. Denies pain. BP is controlled. No issues with urination. Denies falls.   Therapies: 2/week DME: Bedside commode, shower bench Mobility: Wheelchair at all times.  Pain Inventory Average Pain 0 Pain Right Now 0 My pain is no pain  In the last 24 hours, has pain interfered with the following? General activity 0 Relation with others 0 Enjoyment of life 0 What TIME of day is your pain at its worst? no pain Sleep (in general) Good  Pain is worse with: no pain Pain improves with: no pain Relief from Meds: no pain  Mobility ability to climb steps?  no do you drive?  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 Glaucoma Mother   . Atrial fibrillation Father    Social History   Socioeconomic History  . Marital status: Divorced    Spouse name: Not on file  . Number of children: 1  . Years of education: Not on file  . Highest education level: Not on file  Occupational History  . Not on file  Tobacco Use  . Smoking status: Never Smoker  . Smokeless tobacco: Never  Used  Vaping Use  . Vaping Use: Never used  Substance and Sexual Activity  . Alcohol use: Yes    Comment: very little  . Drug use: No  . Sexual activity: Not on file    Comment: DIVORCE  Other Topics Concern  . 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 Difficulty of Paying Living Expenses:   Food Insecurity:   . Worried About Charity fundraiser in the Last Year:   . Arboriculturist in the Last Year:   Transportation Needs:   . Film/video editor (Medical):   Marland Kitchen Lack of Transportation (Non-Medical):   Physical Activity:   . Days of Exercise per Week:   . Minutes of Exercise per Session:   Stress:   . Feeling of Stress :   Social Connections:   . Frequency of Communication with Friends and Family:   . Frequency of Social Gatherings with Friends and Family:   . Attends Religious Services:   . Active Member of Clubs or Organizations:   . Attends Archivist Meetings:   Marland Kitchen Marital Status:    Past Surgical History:  Procedure Laterality Date  . BUBBLE STUDY  03/11/2020   Procedure:  BUBBLE STUDY;  Surgeon: [name], MD;  Location: [place];  Service: Cardiovascular;;  . CARDIOVERSION N/A 05/13/2018   Procedure: CARDIOVERSION;  Surgeon: [name], MD;  Location: [facility] ENDOSCOPY;  Service: Cardiovascular;  Laterality: N/A;  . CARDIOVERSION N/A 06/13/2018   Procedure: CARDIOVERSION;  Surgeon: [name], MD;  Location: [facility] ENDOSCOPY;  Service: Cardiovascular;  Laterality: N/A;  . CARDIOVERSION N/A 06/23/2018   Procedure: CARDIOVERSION;  Surgeon: [name], MD;  Location: [facility] ENDOSCOPY;  Service: Cardiovascular;  Laterality: N/A;  . CARDIOVERSION N/A 03/11/2020   Procedure: CARDIOVERSION;  Surgeon: [name], MD;  Location: [place];  Service: Cardiovascular;  Laterality: N/A;  . excision of actinic keratosis    . EYE SURGERY     eye  lift  . IR CT HEAD LTD  01/27/2020  . IR PERCUTANEOUS ART THROMBECTOMY/INFUSION INTRACRANIAL INC DIAG ANGIO  01/27/2020      . IR PERCUTANEOUS ART THROMBECTOMY/INFUSION INTRACRANIAL INC DIAG ANGIO  01/27/2020  . RADIOLOGY WITH ANESTHESIA N/A 01/27/2020   Procedure: IR WITH ANESTHESIA;  Surgeon: Radiologist, Medication, MD;  Location: [place];  Service: Radiology;  Laterality: N/A;  . TEE WITHOUT CARDIOVERSION N/A 03/11/2020   Procedure: TRANSESOPHAGEAL ECHOCARDIOGRAM (TEE);  Surgeon: [name], MD;  Location: [place];  Service: Cardiovascular;  Laterality: N/A;  . TONSILLECTOMY    . WISDOM TOOTH EXTRACTION     Past Medical History:  Diagnosis Date  . Hypertension   . NICM (nonischemic cardiomyopathy) ([place]) 06/15/2018   04/23/18: EF 45%, 09/15/18: NOrmal LVEF  . Paroxysmal atrial fibrillation ([place]) 06/15/2018  . Stroke ([place])   . Visit for monitoring Tikosyn therapy 06/12/2018   BP 120/75   Pulse 66   Temp 98.8 F (37.1 C)   Ht 5\' 8"  (1.727 m)   Wt 170 lb (77.1 kg)   SpO2 94%   BMI 25.85 kg/m   Opioid Risk Score:   Fall Risk Score:  `1  Depression screen PHQ 2/9  Depression screen PHQ 2/9 03/17/2020  Decreased Interest 0  Down, Depressed, Hopeless 0  PHQ - 2 Score 0  Altered sleeping 1  Tired, decreased energy 0  Change in appetite 0  Feeling bad or failure about yourself  0  Trouble concentrating 1  Moving slowly or fidgety/restless 3  Suicidal thoughts 0  PHQ-9 Score 5  Difficult doing work/chores Not difficult at all    Review of Systems  Musculoskeletal: Positive for gait problem. Negative for arthralgias, back pain, joint swelling and myalgias.  Neurological: Positive for speech difficulty, weakness and numbness.       Objective:   Physical Exam Constitutional: NAD. Vital signs reviewed. HENT: Normocephalic.  Atraumatic. Eyes: EOMI. No discharge. Cardiovascular: No JVD. Respiratory: Normal effort.  No stridor. GI: Non-distended. Skin: Warm and dry.   Intact. Psych: Normal mood.  Normal behavior. Musc: Mild left-sided edema. No tenderness in extremities. Neuro: Alert. Expressive aphasia, stable Right facial weakness, unchanged. Motor: RUE: Shoulder abduction 3/5, elbow flex/ext 2+/5, wrist extension 3/5, hand grip 1/5 RLE: Hip flexion, knee extension 3-/5, distally 0/5 Increase in tone in RUE     Assessment & Plan:  Male with history of PAF/on Xarelto, and ICM presents for hospital follow up for left MCA infarct.    1. Right hemiplegia, impaired mobility and ADLs secondary to left MCA ischemic stroke  Cont therapies  Follow up with Neurology  Cont WHO/PRAFO nightly  Requests disability forms filled  Requests handicap placard    2. PAF:   Cont meds per  Cards  3. HTN:   Cont meds  Controlled today  4.  Post stroke dysphagia:   Cont accommodations   Cont therapies  5.  Urinary retention/Neurogenic bladder             Continue Flomax              Wean Urecholine to 12.5 TID  6.  Spasticity  Restart Tizanidine 2 TID  7. Gait abnormality  Con therapies  Cont wheelchair for safety  Meds reviewed Referrals reviewed All questions answered

## 2020-03-18 ENCOUNTER — Ambulatory Visit: Payer: BC Managed Care – PPO

## 2020-03-18 DIAGNOSIS — I69351 Hemiplegia and hemiparesis following cerebral infarction affecting right dominant side: Secondary | ICD-10-CM | POA: Diagnosis not present

## 2020-03-18 DIAGNOSIS — M6281 Muscle weakness (generalized): Secondary | ICD-10-CM

## 2020-03-18 DIAGNOSIS — R2689 Other abnormalities of gait and mobility: Secondary | ICD-10-CM

## 2020-03-18 NOTE — Patient Instructions (Signed)
Access Code: [email or web address] URL: [email or web address] Date: 03/18/2020 Prepared by: Cherly Anderson  Exercises Supine Hip and Knee Flexion PROM with Caregiver - 2 x daily - 5 x weekly - 2 sets - 10 reps Supine Bridge - 1 x daily - 5 x weekly - 2 sets - 10 reps Supine Knee Extension Strengthening - 1 x daily - 5 x weekly - 2 sets - 10 reps

## 2020-03-18 NOTE — Therapy (Signed)
[facility] [address] [place], [place], [address] Phone: [phone]   Fax:  [phone]  Physical Therapy Treatment  Patient Details  Name: Dustin Schneider MRN: [id number] Date of Birth: 04-12-1956 Referring Provider (PT): Lauraine Rinne, PA-C (will be followed by Dr. Posey Pronto)   Encounter Date: 03/18/2020   PT End of Session - 03/18/20 0809    Visit Number 2    Number of Visits 25    Date for PT Re-Evaluation 06/14/20    Authorization Type BCBS    PT Start Time 0806    PT Stop Time 0848    PT Time Calculation (min) 42 min    Equipment Utilized During Treatment Gait belt    Activity Tolerance Patient tolerated treatment well    Behavior During Therapy [facility] for tasks assessed/performed           Past Medical History:  Diagnosis Date  . Hypertension   . NICM (nonischemic cardiomyopathy) ([place]) 06/15/2018   04/23/18: EF 45%, 09/15/18: NOrmal LVEF  . Paroxysmal atrial fibrillation ([place]) 06/15/2018  . Stroke ([place])   . Visit for monitoring Tikosyn therapy 06/12/2018    Past Surgical History:  Procedure Laterality Date  . BUBBLE STUDY  03/11/2020   Procedure: BUBBLE STUDY;  Surgeon: [name], MD;  Location: [place];  Service: Cardiovascular;;  . CARDIOVERSION N/A 05/13/2018   Procedure: CARDIOVERSION;  Surgeon: [name], MD;  Location: [facility] ENDOSCOPY;  Service: Cardiovascular;  Laterality: N/A;  . CARDIOVERSION N/A 06/13/2018   Procedure: CARDIOVERSION;  Surgeon: [name], MD;  Location: [facility] ENDOSCOPY;  Service: Cardiovascular;  Laterality: N/A;  . CARDIOVERSION N/A 06/23/2018   Procedure: CARDIOVERSION;  Surgeon: [name], MD;  Location: [facility] ENDOSCOPY;  Service: Cardiovascular;  Laterality: N/A;  . CARDIOVERSION N/A 03/11/2020   Procedure: CARDIOVERSION;  Surgeon: [name], MD;  Location: [place];  Service: Cardiovascular;  Laterality: N/A;  . excision of actinic keratosis    . EYE SURGERY     eye  lift  . IR CT HEAD LTD  01/27/2020  . IR PERCUTANEOUS ART THROMBECTOMY/INFUSION INTRACRANIAL INC DIAG ANGIO  01/27/2020      . IR PERCUTANEOUS ART THROMBECTOMY/INFUSION INTRACRANIAL INC DIAG ANGIO  01/27/2020  . RADIOLOGY WITH ANESTHESIA N/A 01/27/2020   Procedure: IR WITH ANESTHESIA;  Surgeon: Radiologist, Medication, MD;  Location: [place];  Service: Radiology;  Laterality: N/A;  . TEE WITHOUT CARDIOVERSION N/A 03/11/2020   Procedure: TRANSESOPHAGEAL ECHOCARDIOGRAM (TEE);  Surgeon: [name], MD;  Location: [place];  Service: Cardiovascular;  Laterality: N/A;  . TONSILLECTOMY    . WISDOM TOOTH EXTRACTION      There were no vitals filed for this visit.   Subjective Assessment - 03/18/20 0810    Subjective Pt presents with brother-in-law [name] today. Pt denies any changes. Reports exercise at home are mostly for arm but has been working on some heel slides seated and glut/quad sets.    Patient is accompained by: Family member   [name]   Pertinent History PMH: HTN, paroxysmal a fib    Limitations Standing;Walking    Patient Stated Goals "wants everything working on R side again"    Currently in Pain? No/denies                             [facility] Adult PT Treatment/Exercise - 03/18/20 0841      Bed Mobility   Bed Mobility Rolling Left;Sit to Supine;Left Sidelying  to Sit    Rolling Left Minimal Assistance - Patient > 75%   cued to bring right arm across, PT bent up right knee   Left Sidelying to Sit Contact Guard/Touching assist    Sit to Supine Supervision/Verbal cueing      Transfers   Transfers Sit to Stand;Stand to Sit;Stand Pivot Transfers    Sit to Stand 4: Min assist    Sit to Stand Details Verbal cues for technique    Sit to Stand Details (indicate cue type and reason) PT blocking right knee and stabilizing right ankle to prevent supination.    Stand to Sit 4: Min assist    Stand to Sit Details (indicate cue type and reason) Verbal cues for technique     Stand Pivot Transfers 4: Min assist    Stand Pivot Transfer Details (indicate cue type and reason) PT blocking left knee. Transferred to the left both ways. Brother-in-law performed transfer mat to w/c so PT could check his technique. He did well with blocking knee just encouraged to not bend at back as much to protect himself.      Neuro Re-ed    Neuro Re-ed Details  Sit to stand x 3 from edge of mat with PT blocking right knee and preventing supination at right ankle min assist. Standing without UE support with PT blocking knee then repeated with mirror in front with weight shifting x 10. PT provided verbal cues to tighten right quad and facilitated to keep pelvis straight as right pelvis posterior rotated in standing. Pt was able to contract right quad at times. Needed min/mod assist at right knee at times.      Exercises   Exercises Other Exercises    Other Exercises  Supine exercises: heel slides with resistance in to extension and min assist in to flexion with support at heel and knee x 10, bridges x 10 with PT stabilizing at right hip, isometric hooklying hip abd/add x 5 each with 5 sec holds, bridges over bolster, LAQ x 10 with tapping on quad to try to facilitate more contraction. Pt able to increase range as went on. Brother-in-law watching and instructed on how he could help at home.                  PT Education - 03/18/20 1213    Education Details Started on strengthening HEP    Person(s) Educated Patient;Other (comment)   brother-in-law   Methods Explanation;Demonstration    Comprehension Verbalized understanding            PT Short Term Goals - 03/16/20 1136      PT SHORT TERM GOAL #1   Title Pt and pt's family members will be independent with initial HEP in order to build upon functional gains made in therapy. ALL LTGS DUE 04/13/20    Time 4    Period Weeks    Status New    Target Date 04/13/20      PT SHORT TERM GOAL #2   Title Pt will perform sit > supine with  supervision and supine > sit with min guard for bed mobility in order to decr caregiver burden.    Time 4    Period Weeks    Status New      PT SHORT TERM GOAL #3   Title Pt will undergo assessment of AFO for functional transfers and gait.    Time 4    Period Weeks    Status New  PT SHORT TERM GOAL #4   Title Pt will ambulate 49' with RW with mod A x1 in order to improve functional mobility.    Time 4    Period Weeks    Status New      PT SHORT TERM GOAL #5   Title Pt will perform sit <> stand with RW vs. Stedy with min A in order to improve functional BLE strength for transfers.    Time 4    Period Weeks    Status New      Additional Short Term Goals   Additional Short Term Goals Yes      PT SHORT TERM GOAL #6   Title Pt will perform squat pivot transfers from w/c <> mat table with min guard.    Baseline min A for blocking R knee    Time 4    Period Weeks    Status New             PT Long Term Goals - 03/16/20 1139      PT LONG TERM GOAL #1   Title Pt and pt's family members will be independent with final HEP in order to build upon functional gains made in therapy. ALL LTGS DUE 06/08/20    Time 12    Period Weeks    Status New    Target Date 06/08/20      PT LONG TERM GOAL #2   Title Pt will ambulate 115' with min guard/min A with RW and R AFO in order to improve household mobility.    Time 12    Period Weeks    Status New      PT LONG TERM GOAL #3   Title Pt will perform sit <> stand with RW with min guard in order to improve functional BLE strength for transfers.    Time 12    Period Weeks    Status New      PT LONG TERM GOAL #4   Title Pt will perform squat pivot transfer with mod I vs. stand pivot transfer with RW with min guard in order to decr caregiver burden.    Time 12    Period Weeks    Status New                 Plan - 03/18/20 1218    Clinical Impression Statement PT focused on establishing initital strengthening HEP at  session today with pt's brother-in-law present to train. Also work on standing with weight shifting to work on balance and functional strength. Pt able to ellicit more right quad contraction with cuing but buckles easily.    Personal Factors and Comorbidities Comorbidity 3+    Comorbidities dense L MCA CVA, paroxysmal A. fib on chronic Xarelto, nonischemic cardiomyopathy and hypertension.    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, works as an Electrical engineer Evolving/Moderate complexity    Rehab Potential Good    PT Frequency 2x / week    PT Duration 12 weeks    PT Treatment/Interventions ADLs/Self Care Home Management;Aquatic Therapy;DME Instruction;Gait training;Stair training;Functional mobility training;Therapeutic activities;Therapeutic exercise;Electrical Stimulation;Balance training;Neuromuscular re-education;Wheelchair mobility training;Orthotic Fit/Training;Patient/family education;Passive range of motion;Energy conservation    PT Next Visit Plan NMR for weight shifting towards R in sitting, standing/sit <> stands with Stedy. when pt discharged from IPR:mod +2 to ambulate 24ft with RW and/or handrail with total A to advance R  LE, facilitate weight shifting, and control knee flexion/hyperextension.    Consulted and Agree with Plan of Care Patient;Family member/caregiver    Family Member Consulted brother in Art gallery manager           Patient will benefit from skilled therapeutic intervention in order to improve the following deficits and impairments:  Abnormal gait, Decreased activity tolerance, Decreased balance, Decreased cognition, Decreased mobility, Decreased coordination, Decreased range of motion, Decreased endurance, Decreased strength, Hypomobility, Difficulty walking,  Impaired UE functional use, Impaired vision/preception, Postural dysfunction, Impaired sensation  Visit Diagnosis: Other abnormalities of gait and mobility  Muscle weakness (generalized)  Hemiplegia and hemiparesis following cerebral infarction affecting right dominant side [facility])     Problem List Patient Active Problem List   Diagnosis Date Noted  . Neurogenic bladder 03/17/2020  . Sepsis due to pneumonia ([place]) 03/06/2020  . History of stroke with residual deficit 03/06/2020  . Transient hypotension 03/06/2020  . Spastic hemiplegia affecting nondominant side ([place])   . History of hypertension   . Expressive aphasia   . AKI (acute kidney injury) ([place])   . Global aphasia   . Hypoalbuminemia due to protein-calorie malnutrition ([place])   . Dysphagia, post-stroke   . Hyperlipidemia 02/02/2020  . Dysphagia following cerebral infarction 02/02/2020  . Aspiration pneumonia ([place]) 02/02/2020  . Acute ischemic left MCA stroke ([place]) 02/02/2020  . Acute respiratory failure ([place])   . Acute ischemic stroke [facility]) s/p clot retrieval L MCA & ACA A3 01/27/2020  . Aspiration into airway 01/27/2020  . Chronic anticoagulation 01/27/2020  . Paroxysmal atrial fibrillation ([place]) 06/15/2018  . Essential hypertension 06/15/2018  . NICM (nonischemic cardiomyopathy) ([place]) 06/15/2018  . Visit for monitoring Tikosyn therapy 06/12/2018    Electa Sniff, PT, DPT, NCS 03/18/2020, 12:21 PM  [facility] [address] [place], [place], [address] Phone: [phone]   Fax:  [phone]  Name: Dustin Schneider MRN: [id number] Date of Birth: [birth year]

## 2020-03-20 NOTE — Anesthesia Postprocedure Evaluation (Signed)
Anesthesia Post Note  Patient: Dustin Schneider  Procedure(s) Performed: TRANSESOPHAGEAL ECHOCARDIOGRAM (TEE) (N/A ) CARDIOVERSION (N/A ) BUBBLE STUDY     Patient location during evaluation: PACU Anesthesia Type: General Level of consciousness: patient cooperative and awake Pain management: pain level controlled Vital Signs Assessment: post-procedure vital signs reviewed and stable Respiratory status: spontaneous breathing, nonlabored ventilation, respiratory function stable and patient connected to nasal cannula oxygen Cardiovascular status: stable Postop Assessment: no apparent nausea or vomiting Anesthetic complications: no   No complications documented.  Last Vitals:  Vitals:   03/11/20 0952 03/11/20 0956  BP: 111/69 111/69  Pulse: 76   Resp:    Temp:    SpO2:      Last Pain:  Vitals:   03/11/20 1000  TempSrc:   PainSc: 0-No pain                 Tameika Heckmann

## 2020-03-22 ENCOUNTER — Encounter: Payer: Self-pay | Admitting: Cardiology

## 2020-03-22 ENCOUNTER — Other Ambulatory Visit: Payer: Self-pay

## 2020-03-22 ENCOUNTER — Ambulatory Visit: Payer: BC Managed Care – PPO | Admitting: Cardiology

## 2020-03-22 VITALS — BP 110/71 | HR 59 | Ht 68.0 in | Wt 170.0 lb

## 2020-03-22 DIAGNOSIS — I5189 Other ill-defined heart diseases: Secondary | ICD-10-CM

## 2020-03-22 DIAGNOSIS — E7800 Pure hypercholesterolemia, unspecified: Secondary | ICD-10-CM

## 2020-03-22 DIAGNOSIS — Z79899 Other long term (current) drug therapy: Secondary | ICD-10-CM

## 2020-03-22 DIAGNOSIS — I480 Paroxysmal atrial fibrillation: Secondary | ICD-10-CM

## 2020-03-22 DIAGNOSIS — I10 Essential (primary) hypertension: Secondary | ICD-10-CM

## 2020-03-22 DIAGNOSIS — Z8673 Personal history of transient ischemic attack (TIA), and cerebral infarction without residual deficits: Secondary | ICD-10-CM

## 2020-03-22 DIAGNOSIS — Z9889 Other specified postprocedural states: Secondary | ICD-10-CM

## 2020-03-22 DIAGNOSIS — Z7901 Long term (current) use of anticoagulants: Secondary | ICD-10-CM

## 2020-03-22 MED ORDER — METOPROLOL TARTRATE 50 MG PO TABS: 50.0000 mg | ORAL_TABLET | Freq: Two times a day (BID) | ORAL | 2 refills | Status: DC

## 2020-03-22 NOTE — Progress Notes (Signed)
Primary Physician/Referring:  Marrian Salvage, FNP  Patient ID: Dustin Schneider, male    DOB: [birth year], 64 y.o.   MRN: [id number]  Chief Complaint  Patient presents with  . 10 day TOC   HPI:    Dustin Schneider  is a 64 y.o.  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  Since last office visit patient has unfortunately had a stroke involving the left MCA/ACA territory back in June 2021 and is now status post clot removal.  After an extensive stay during his hospitalization in June 2021 he was admitted to inpatient rehab for PT and OT therapy.  Thereafter patient was discharged home.  Shortly after being discharged while he was at home he was having high fevers that were not responsive to Tylenol.  Patient came to the hospital for further evaluation and management.  Patient was noted to have sepsis secondary to HCAP which was treated with broad-spectrum antibiotics and later was found to have Legionella in the urine and was treated with doxycycline.  Patient has now completed his antibiotic course.  During his recent hospitalization in July with his HCAP leading to sepsis he was having A. fib with rapid ventricular rate.  He was restarted on Tikosyn during his last hospitalization and also underwent TEE cardioversion.  Patient remains in normal sinus rhythm and overall doing well.    He continues to have residual hemiparesis involving his right side and expressive aphasia.  He has a neurology appointment next week.  He also follows up with his PCP in 2 weeks.  Past Medical History:  Diagnosis Date  . Hypertension   . NICM (nonischemic cardiomyopathy) ([place]) 06/15/2018   04/23/18: EF 45%, 09/15/18:  NOrmal LVEF  . Paroxysmal atrial fibrillation ([place]) 06/15/2018  . Stroke ([place])   . Visit for monitoring Tikosyn therapy 06/12/2018   Past Surgical History:  Procedure Laterality Date  . BUBBLE STUDY  03/11/2020   Procedure: BUBBLE STUDY;  Surgeon: [name], MD;  Location: [facility];  Service: Cardiovascular;;  . CARDIOVERSION N/A 05/13/2018   Procedure: CARDIOVERSION;  Surgeon: [name], MD;  Location: [facility] ENDOSCOPY;  Service: Cardiovascular;  Laterality: N/A;  . CARDIOVERSION N/A 06/13/2018   Procedure: CARDIOVERSION;  Surgeon: [name], MD;  Location: [facility] ENDOSCOPY;  Service: Cardiovascular;  Laterality: N/A;  . CARDIOVERSION N/A 06/23/2018   Procedure: CARDIOVERSION;  Surgeon: [name], MD;  Location: [facility] ENDOSCOPY;  Service: Cardiovascular;  Laterality: N/A;  . CARDIOVERSION N/A 03/11/2020   Procedure: CARDIOVERSION;  Surgeon: [name], MD;  Location: [facility];  Service: Cardiovascular;  Laterality: N/A;  . excision of actinic keratosis    . EYE SURGERY     eye lift  . IR CT HEAD LTD  01/27/2020  . IR PERCUTANEOUS ART THROMBECTOMY/INFUSION INTRACRANIAL INC DIAG ANGIO  01/27/2020      . IR PERCUTANEOUS ART THROMBECTOMY/INFUSION INTRACRANIAL INC DIAG ANGIO  01/27/2020  . RADIOLOGY WITH ANESTHESIA N/A 01/27/2020   Procedure: IR WITH ANESTHESIA;  Surgeon: Radiologist, Medication, MD;  Location: [facility];  Service: Radiology;  Laterality: N/A;  . TEE WITHOUT CARDIOVERSION N/A 03/11/2020   Procedure: TRANSESOPHAGEAL ECHOCARDIOGRAM (TEE);  Surgeon: [name], MD;  Location: [facility];  Service: Cardiovascular;  Laterality: N/A;  . TONSILLECTOMY    . WISDOM TOOTH EXTRACTION  Family History  Problem Relation Age of Onset  . Glaucoma Mother   . Atrial fibrillation Father     Social History   Tobacco Use  . Smoking status: Never Smoker  . Smokeless tobacco: Never Used  Substance Use Topics  . Alcohol use: Not Currently   Marital Status: Divorced  ROS  Review of  Systems  Constitutional: Negative for chills and fever.  HENT: Negative for hoarse voice and nosebleeds.   Eyes: Negative for discharge, double vision and pain.  Cardiovascular: Negative for chest pain, claudication, dyspnea on exertion, leg swelling, near-syncope, orthopnea, palpitations, paroxysmal nocturnal dyspnea and syncope.  Respiratory: Negative for hemoptysis and shortness of breath.   Musculoskeletal: Negative for muscle cramps and myalgias.  Gastrointestinal: Negative for abdominal pain, constipation, diarrhea, hematemesis, hematochezia, melena, nausea and vomiting.  Neurological: Negative for dizziness and light-headedness.       Right sided hemiparesis and expressive aphasia due to recent stroke in June 2021.   Objective  Blood pressure 110/71, pulse (!) 59, height 5\' 8"  (1.727 m), weight 170 lb (77.1 kg), SpO2 97 %.  Vitals with BMI 03/22/2020 03/17/2020 03/11/2020  Height 5\' 8"  5\' 8"  -  Weight 170 lbs 170 lbs -  BMI 62.83 66.29 -  Systolic 476 546 503  Diastolic 71 75 69  Pulse 59 66 -     Physical Exam Constitutional:      General: He is not in acute distress.    Appearance: He is well-developed.     Comments: Age-appropriate gentleman, presents in a wheelchair.  Hemodynamically stable, no acute distress.  Cardiovascular:     Rate and Rhythm: Regular rhythm. Bradycardia present.     Pulses: Intact distal pulses.     Heart sounds: No murmur heard.  No gallop.      Comments: S1, S2 normal.   No JVD, No leg edema.   Pulmonary:     Effort: Pulmonary effort is normal. No accessory muscle usage or respiratory distress.     Breath sounds: Normal breath sounds.  Abdominal:     Palpations: Abdomen is soft.    Laboratory examination:   Recent Labs    03/09/20 0656 03/10/20 0421 03/11/20 0138  NA 138 137 139  K 3.8 4.5 4.1  CL 102 104 103  CO2 25 24 26   GLUCOSE 116* 107* 111*  BUN 10 13 12   CREATININE 0.79 0.90 0.85  CALCIUM 8.3* 8.4* 8.6*  GFRNONAA >60 >60 >60   GFRAA >60 >60 >60   estimated creatinine clearance is 83.8 mL/min (by C-G formula based on SCr of 0.85 mg/dL).  CMP Latest Ref Rng & Units 03/11/2020 03/10/2020 03/09/2020  Glucose 70 - 99 mg/dL 111(H) 107(H) 116(H)  BUN 8 - 23 mg/dL 12 13 10   Creatinine 0.61 - 1.24 mg/dL 0.85 0.90 0.79  Sodium 135 - 145 mmol/L 139 137 138  Potassium 3.5 - 5.1 mmol/L 4.1 4.5 3.8  Chloride 98 - 111 mmol/L 103 104 102  CO2 22 - 32 mmol/L 26 24 25   Calcium 8.9 - 10.3 mg/dL 8.6(L) 8.4(L) 8.3(L)  Total Protein 6.5 - 8.1 g/dL - - -  Total Bilirubin 0.3 - 1.2 mg/dL - - -  Alkaline Phos 38 - 126 U/L - - -  AST 15 - 41 U/L - - -  ALT 0 - 44 U/L - - -   CBC Latest Ref Rng & Units 03/09/2020 03/07/2020 03/06/2020  WBC 4.0 - 10.5 K/uL 5.7 9.2 12.4(H)  Hemoglobin 13.0 - 17.0  g/dL 11.3(L) 11.3(L) 13.2  Hematocrit 39 - 52 % 34.7(L) 34.8(L) 40.6  Platelets 150 - 400 K/uL 234 180 201   Lipid Panel     Component Value Date/Time   CHOL 125 01/28/2020 0716   CHOL 226 (H) 12/17/2019 0831   TRIG 298 (H) 01/28/2020 0716   HDL 32 (L) 01/28/2020 0716   HDL 39 (L) 12/17/2019 0831   CHOLHDL 3.9 01/28/2020 0716   VLDL 60 (H) 01/28/2020 0716   LDLCALC 33 01/28/2020 0716   LDLCALC 154 (H) 12/17/2019 0831   HEMOGLOBIN A1C Lab Results  Component Value Date   HGBA1C 5.6 01/28/2020   MPG 114 01/28/2020     TSH Recent Labs    12/17/19 0836  TSH 3.160   Medications and allergies  No Known Allergies   Current Outpatient Medications  Medication Instructions  . acetaminophen (TYLENOL) 325-650 mg, Oral, Every 4 hours PRN  . Amino Acids-Protein Hydrolys (FEEDING SUPPLEMENT, PRO-STAT SUGAR FREE 64,) LIQD 30 mLs, Oral, 2 times daily  . apixaban (ELIQUIS) 5 mg, Oral, 2 times daily  . bethanechol (URECHOLINE) 12.5 mg, Oral, 3 times daily  . diltiazem (CARDIZEM CD) 180 mg, Oral, Daily  . dofetilide (TIKOSYN) 500 mcg, Oral, 2 times daily  . guaiFENesin (MUCINEX) 600 mg, Oral, 2 times daily PRN  . metoprolol tartrate  (LOPRESSOR) 50 mg, Oral, 2 times daily  . rosuvastatin (CRESTOR) 10 mg, Oral, Daily after supper  . senna-docusate (SENOKOT-S) 8.6-50 MG tablet 1 tablet, Oral, At bedtime PRN  . tamsulosin (FLOMAX) 0.4 mg, Oral, Daily  . tiZANidine (ZANAFLEX) 2 mg, Oral, 3 times daily   Radiology:   No results found.  Cardiac Studies:   Nuclear stress test  05/05/2018: 1. The resting electrocardiogram demonstrated atrial fibrillation. The stress electrocardiogram was positive for ischemia with 2 mm downsloping ST depression in the inferior and lateral leads, persisted for 2 mintues into recovery. Stress symptoms included palpitations and fatigue. Patient exercised on Bruce protocol for 6:00 minutes and achieved 7.05 METS. Stress test terminated due to fatigue and 113% MPHR achieved (Target HR >85%). 2. Left ventricular cavity is noted to be normal on the rest and stress studies. SPECT images demonstrate homogeneous tracer distribution throughout the myocardium. The left ventricular ejection fraction was calculated to be 38%, but visually appears to be at least low normal without wall motion abnormality. This is an intermediate risk study due to abnormal ST change and low LVEF (difficult in view of gating with A. Fib), clinical correlation recommended.  Echocardiogram 01/28/2020: 1.The left ventricle has normal function. The left ventricle has no regional wall motion abnormalities. Left ventricular diastolic parameters are  consistent with Grade II diastolic dysfunction (pseudonormalization).  2. Right ventricular systolic function is normal. The right ventricular size is normal. There is mildly elevated pulmonary artery systolic pressure.  3. Left atrial size was moderately dilated.  4. Right atrial size was moderately dilated.  5. The mitral valve is normal in structure. Mild mitral valve regurgitation.  6. The aortic valve is normal in structure. Aortic valve regurgitation is mild to moderate.  7.  The inferior vena cava is dilated in size with >50% respiratory variability, suggesting right atrial pressure of 8 mmHg.  EKG 03/23/2019: Marked sinus bradycardia with rate of 54 bpm, borderline left atrial abnormality, normal axis.  No evidence of ischemia.  Normal QT interval.  Normal EKG. No significant change from  EKG 09/22/2018: Marked sinus bradycardia at the rate of 49 bpm.  Assessment  ICD-10-CM   1. Paroxysmal atrial fibrillation (HCC)  I48.0 EKG 12-Lead    metoprolol tartrate (LOPRESSOR) 50 MG tablet  2. Long term current use of antiarrhythmic drug  Z79.899   3. Long term (current) use of anticoagulants  Z79.01   4. History of cardioversion  Z98.890   5. History of left MCA/ACA stroke with clot removal and right-sided residual hemiparesis and expressive aphasia  Z86.73   6. Essential hypertension  I10   7. Pure hypercholesterolemia  E78.00   8. Diastolic dysfunction  O03.21      Meds ordered this encounter  Medications  . metoprolol tartrate (LOPRESSOR) 50 MG tablet    Sig: Take 1 tablet (50 mg total) by mouth 2 (two) times daily.    Dispense:  60 tablet    Refill:  2    Medications Discontinued During This Encounter  Medication Reason  . metoprolol tartrate (LOPRESSOR) 50 MG tablet Dose change    Recommendations:   Dustin Schneider  is a 64 y.o. 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  Paroxysmal atrial fibrillation status post multiple cardioversions, currently on antiarrhythmic medications: Patient is currently sinus bradycardia.  He is currently on Tikosyn, metoprolol, and Cardizem.  Patient's ventricular rate is well controlled.  Currently on metoprolol 50 mg p.o. 3 times daily.  Given the underlying bradycardia would recommend decreasing  metoprolol to 50 mg p.o. twice daily.  Continue oral anticoagulation for thromboembolic prophylaxis given his CHA2DS2-VASc score and recent cardioversion.  Patient does not endorse any evidence of bleeding.  Recent CVA with right-sided residual hemiparesis and expressive aphasia due to stroke involving the left MCA A/ACA territory with status post clot removal: Educated on importance of secondary prevention. Patient has an upcoming follow-up visit with neurology.  Chronic diastolic heart failure: Medications reconciled.  Most recent echocardiogram results reviewed.  Educated on the importance of salt restrictions.  Continue to monitor.  Mixed hyperlipidemia: Currently on statin therapy.  Does not endorse myalgias.  Continue to monitor  Patient is encouraged to follow-up with his neurology and primary care providers for the management of his other chronic comorbid conditions.  Orders Placed This Encounter  Procedures  . EKG 12-Lead   --Continue cardiac medications as reconciled in final medication list. --Return in about 3 months (around 06/22/2020) for afib follow up.. Or sooner if needed with his primary cardiologist, Dr. [name].  Patient's questions and concerns were addressed to his and his brother's satisfaction. He and his brother voices understanding of the instructions provided during this encounter.   This note was created using a voice recognition software as a result there may be grammatical errors inadvertently enclosed that do not reflect the nature of this encounter. Every attempt is made to correct such errors.  Rex Kras, [place], [facility]  Pager: [phone] Office: [phone]

## 2020-03-23 ENCOUNTER — Encounter: Payer: Self-pay | Admitting: Physical Therapy

## 2020-03-23 ENCOUNTER — Ambulatory Visit: Payer: BC Managed Care – PPO | Admitting: Physical Therapy

## 2020-03-23 ENCOUNTER — Ambulatory Visit: Payer: BC Managed Care – PPO | Admitting: Speech Pathology

## 2020-03-23 ENCOUNTER — Encounter: Payer: Self-pay | Admitting: Speech Pathology

## 2020-03-23 ENCOUNTER — Ambulatory Visit: Payer: BC Managed Care – PPO | Admitting: Occupational Therapy

## 2020-03-23 DIAGNOSIS — I69351 Hemiplegia and hemiparesis following cerebral infarction affecting right dominant side: Secondary | ICD-10-CM

## 2020-03-23 DIAGNOSIS — R2681 Unsteadiness on feet: Secondary | ICD-10-CM

## 2020-03-23 DIAGNOSIS — R2689 Other abnormalities of gait and mobility: Secondary | ICD-10-CM

## 2020-03-23 DIAGNOSIS — R278 Other lack of coordination: Secondary | ICD-10-CM

## 2020-03-23 DIAGNOSIS — R482 Apraxia: Secondary | ICD-10-CM

## 2020-03-23 DIAGNOSIS — R4701 Aphasia: Secondary | ICD-10-CM

## 2020-03-23 DIAGNOSIS — M6281 Muscle weakness (generalized): Secondary | ICD-10-CM

## 2020-03-23 NOTE — Therapy (Signed)
[facility] [address] [place], [place], [address] Phone: [phone]   Fax:  [phone]  Speech Language Pathology Evaluation  Patient Details  Name: Dustin Schneider MRN: [id number] Date of Birth: 1955/12/29 Referring Provider (SLP): Dr. Delice Lesch   Encounter Date: 03/23/2020   End of Session - 03/23/20 1211    Visit Number 1    Number of Visits 25    Date for SLP Re-Evaluation 06/15/20    SLP Start Time 0846    SLP Stop Time  0931    SLP Time Calculation (min) 45 min    Activity Tolerance Patient tolerated treatment well           Past Medical History:  Diagnosis Date  . Hypertension   . NICM (nonischemic cardiomyopathy) ([place]) 06/15/2018   04/23/18: EF 45%, 09/15/18: NOrmal LVEF  . Paroxysmal atrial fibrillation ([place]) 06/15/2018  . Stroke ([place])   . Visit for monitoring Tikosyn therapy 06/12/2018    Past Surgical History:  Procedure Laterality Date  . BUBBLE STUDY  03/11/2020   Procedure: BUBBLE STUDY;  Surgeon: [name], MD;  Location: [facility];  Service: Cardiovascular;;  . CARDIOVERSION N/A 05/13/2018   Procedure: CARDIOVERSION;  Surgeon: [name], MD;  Location: [facility] ENDOSCOPY;  Service: Cardiovascular;  Laterality: N/A;  . CARDIOVERSION N/A 06/13/2018   Procedure: CARDIOVERSION;  Surgeon: [name], MD;  Location: [facility] ENDOSCOPY;  Service: Cardiovascular;  Laterality: N/A;  . CARDIOVERSION N/A 06/23/2018   Procedure: CARDIOVERSION;  Surgeon: [name], MD;  Location: [facility] ENDOSCOPY;  Service: Cardiovascular;  Laterality: N/A;  . CARDIOVERSION N/A 03/11/2020   Procedure: CARDIOVERSION;  Surgeon: [name], MD;  Location: [facility];  Service: Cardiovascular;  Laterality: N/A;  . excision of actinic keratosis    . EYE SURGERY     eye lift  . IR CT HEAD LTD  01/27/2020  . IR PERCUTANEOUS ART THROMBECTOMY/INFUSION INTRACRANIAL INC DIAG ANGIO  01/27/2020      . IR PERCUTANEOUS ART  THROMBECTOMY/INFUSION INTRACRANIAL INC DIAG ANGIO  01/27/2020  . RADIOLOGY WITH ANESTHESIA N/A 01/27/2020   Procedure: IR WITH ANESTHESIA;  Surgeon: Radiologist, Medication, MD;  Location: [facility];  Service: Radiology;  Laterality: N/A;  . TEE WITHOUT CARDIOVERSION N/A 03/11/2020   Procedure: TRANSESOPHAGEAL ECHOCARDIOGRAM (TEE);  Surgeon: [name], MD;  Location: [facility];  Service: Cardiovascular;  Laterality: N/A;  . TONSILLECTOMY    . WISDOM TOOTH EXTRACTION      There were no vitals filed for this visit.   Subjective Assessment - 03/23/20 0945    Subjective Points to family member for greeting response    Patient is accompained by: Family member   Brother in Sports coach, [name]   Currently in Pain? No/denies              SLP Evaluation [facility] - 03/23/20 0945      SLP Visit Information   SLP Received On 03/23/20    Referring Provider (SLP) Dr. Delice Lesch    Onset Date 01/27/20    Medical Diagnosis Left MCA CVA      Subjective   Patient/Family Stated Goal To talk      General Information   HPI 64 y.o. male with dense left MCA CVA 01/27/20. He was hospitalized 6/16 to 03/02/20 including CIR. 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     Mobility Status in wheel chair - on PT caseload  Balance Screen   Has the patient fallen in the past 6 months Yes    How many times? 1    Has the patient had a decrease in activity level because of a fear of falling?  No    Is the patient reluctant to leave their home because of a fear of falling?  No      Prior Functional Status   Cognitive/Linguistic Baseline Within functional limits    Type of Home House     Lives With Alone    Available Support Family;Friend(s)    Education MFA    Vocation Full time employment      Cognition   Overall Cognitive Status Within Functional Limits for tasks assessed      Auditory Comprehension   Overall Auditory Comprehension Impaired    Yes/No  Questions Within Functional Limits    Commands Impaired    One Step Basic Commands 75-100% accurate    Multistep Basic Commands 75-100% accurate    Complex Commands 75-100% accurate    EffectiveTechniques Repetition    Overall Auditory Comprehension Comments Dustin Schneider requested repetition of 25% of items on WAB       Reading Comprehension   Reading Status Not tested      Expression   Primary Mode of Expression Nonverbal - gestures      Verbal Expression   Overall Verbal Expression Impaired    Initiation Impaired    Automatic Speech Day of week;Counting   impaired    Level of Generative/Spontaneous Verbalization Word    Repetition Impaired    Level of Impairment Phrase level    Naming Impairment    Responsive 0-25% accurate    Confrontation 25-49% accurate    Convergent Not tested    Divergent 0-24% accurate    Verbal Errors Phonemic paraphasias;Neologisms;Perseveration;Aware of errors    Pragmatics No impairment    Effective Techniques Sentence completion;Phonemic cues;Articulatory cues      Written Expression   Dominant Hand Right    Written Expression Exceptions to [facility]    Self Formulation Ability Word   personal information impaired     Oral Motor/Sensory Function   Overall Oral Motor/Sensory Function Impaired    Labial ROM Within Functional Limits    Labial Symmetry Within Functional Limits    Labial Strength Within Functional Limits    Labial Sensation Within Functional Limits    Labial Coordination Reduced    Lingual ROM Within Functional Limits   deviate R    Lingual Symmetry Within Functional Limits    Lingual Strength Within Functional Limits    Lingual Sensation Within Functional Limits    Lingual Coordination Reduced    Facial ROM Within Functional Limits    Velum Within Functional Limits    Mandible Within Functional Limits    Overall Oral Motor/Sensory Function oral apraxia mild      Motor Speech   Overall Motor Speech Impaired    Respiration Within  functional limits    Phonation Normal    Resonance Within functional limits    Articulation Within functional limitis    Motor Planning Impaired    Level of Impairment Word    Motor Speech Errors Aware;Inconsistent    Effective Techniques Pacing;Slow rate      Standardized Assessments   Standardized Assessments  Western Aphasia Battery revised    Western Aphasia Battery revised  43.3/100  SLP Education - 03/23/20 1210    Education Details cueing for naming tasks using household items or art tools    Person(s) Educated Patient;Caregiver(s)    Methods Explanation;Demonstration;Verbal cues;Handout    Comprehension Verbal cues required;Need further instruction            SLP Short Term Goals - 03/23/20 1232      SLP SHORT TERM GOAL #1   Title Pt will name family, friends and art items with occasional min A 8/10 with approximations allowed.    Time 6    Period Weeks    Status New      SLP SHORT TERM GOAL #2   Title Pt will name 7 items in personally relevant categories with occasional min A over 2 sessions    Time 6    Period Weeks    Status New      SLP SHORT TERM GOAL #3   Title Pt will perform 4 automatic speech tasks with rare min A.    Time 6    Period Weeks    Status New      SLP SHORT TERM GOAL #4   Title Pt will write personal information (name/address/phone), 5 family/friend/pet names and 7  professional words with occasional min A    Time 6    Period Weeks      SLP SHORT TERM GOAL #5   Title Pt will use mulitmodal communication (gesture, draw, write 1st letter etc) to augment verbal expression to meet needs at home with rare min A from family.    Time 6    Period Weeks    Status New            SLP Long Term Goals - 03/23/20 1245      SLP LONG TERM GOAL #1   Title Pt will verbalize 3 words to explain or describe his artwork and hobbies with occasional min A over 3 sessions, allowing for intellgible  approximations    Time 12    Period Weeks    Status New      SLP LONG TERM GOAL #2   Title Pt and family will utilize multimodal compensations for aphasia and verbal apraxia to participate in 3 turns each of conversation with occasional min A over 2 sessions    Time 12    Period Weeks    Status New      SLP LONG TERM GOAL #3   Title Pt will write 3 word phrases with less than 2 errors with rare min A over 3 sessions    Time 12    Period Weeks    Status New      SLP LONG TERM GOAL #4   Title Reading assessment if indicated    Time 7    Period Weeks    Status New            Plan - 03/23/20 1212    Clinical Impression Statement Dustin Schneider is referred for outpt speech therapy due to aphasia s/p Left CVA. He is accompanied by his brother in law, [name].  Today he presents with moderate Broca's aphasia and moderate to severe verbal apraxia, mild oral apraxia. Picture description of picnic on WAB is word level - naming 4 items, with phonemic paraphasias and neologisms: bail/sail; pitiks/picnic; sea can/sand castle; air carry/kite. He was unable to verbalize his name or address accurately. Auditory comprehension is intact to multistep commands, however he will need A from family for more complex medical,  legal and financial information. Repetition intact to compound words, impaired at phrase level, again with phonemic paraphasias (porty pive/forty five; toes/nose etc) Confrontation naming at word level 50% accurate, including phonemic paraphasias. Perseveration noted on confrontation naming. Dustin Schneider benefitted from phonemic cues and placement cues. Dustin Schneider named 1 animal in 1 minute (kog/dog) other attempts were not identified neologisms (squmpy, wadge, wund etc) Dustin Schneider benefits from phrase completion as well. Responsive naming 50% intact, including paraphasias. Conversation is at word level with significant pauses for halting and groping, phonemic paraphasias. Dustin Schneider is aware of errors and responds  in frustration at times. Dustin Schneider reports at home, Dustin Schneider is using pointing and some gestures, but mostly yes/no questions from family,, which can be inconsistent. Written expression with perseveration and errors at personal information level. Jim accurately gestured for basic objects and body sensations to command (show me you are cold, thirsty, headache etc) He required cues to use gesture to request his glasses, as he was pointing to [place] who didn't understand.I recommend skilled ST to maximize communication for QOL, safety to reduce family burden.    Speech Therapy Frequency 2x / week    Duration --   12 weeks or 25 visits   Treatment/Interventions Environmental controls;Cueing hierarchy;SLP instruction and feedback;Compensatory strategies;Functional tasks;Cognitive reorganization;Compensatory techniques;Internal/external aids;Multimodal communcation approach;Patient/family education;Language facilitation    Potential to Achieve Goals Good    Potential Considerations Severity of impairments           Patient will benefit from skilled therapeutic intervention in order to improve the following deficits and impairments:   Aphasia  Verbal apraxia    Problem List Patient Active Problem List   Diagnosis Date Noted  . Neurogenic bladder 03/17/2020  . Sepsis due to pneumonia ([place]) 03/06/2020  . History of stroke with residual deficit 03/06/2020  . Transient hypotension 03/06/2020  . Spastic hemiplegia affecting nondominant side ([place])   . History of hypertension   . Expressive aphasia   . AKI (acute kidney injury) ([place])   . Global aphasia   . Hypoalbuminemia due to protein-calorie malnutrition ([place])   . Dysphagia, post-stroke   . Hyperlipidemia 02/02/2020  . Dysphagia following cerebral infarction 02/02/2020  . Aspiration pneumonia ([place]) 02/02/2020  . Acute ischemic left MCA stroke ([place]) 02/02/2020  . Acute respiratory failure ([place])   . Acute ischemic stroke [facility]) s/p clot retrieval L  MCA & ACA A3 01/27/2020  . Aspiration into airway 01/27/2020  . Chronic anticoagulation 01/27/2020  . Paroxysmal atrial fibrillation ([place]) 06/15/2018  . Essential hypertension 06/15/2018  . NICM (nonischemic cardiomyopathy) ([place]) 06/15/2018  . Visit for monitoring Tikosyn therapy 06/12/2018    Teneka Malmberg, Annye Rusk MS, CCC-SLP 03/23/2020, 12:52 PM  [facility] [address] [place], [place], [address] Phone: [phone]   Fax:  [phone]  Name: FERNANDEZ KENLEY MRN: [id number] Date of Birth: 08/07/1956

## 2020-03-23 NOTE — Patient Instructions (Addendum)
  Practice naming things in the house - meals, kitchen items bathroom items,  art terms  [name] and [name] can give you the first sound or sentence completion  Automatic speech: counting, days of week, months, idioms,,songs - if he gets stuck saying the same word over and over, stop and take a break  Tips for Talking with People who have Aphasia  . Say one thing at a time . Don't  rush - slow down, be patient . Talk face to face . Reduce background noise . Relax - be natural . Use pen and paper . Write down key words . Draw diagrams or pictures . Don't pretend you understand . Ask what helps . Recap - check you both understand . Be a partner, not a therapist   Aphasia does not affect intelligence, only language. The person with aphasia can still: make decisions, have opinions, and socialize.   Describing words  What group does it belong to?  What do I use it for?  Where can I find it?  What does it LOOK like?  What other words go with it?  What is the 1st sound of the word?          Many Ways to Communicate  Describe it Write it Draw it Gesture it Use related words    Provided by: Barnabas Lister [place], [id number]

## 2020-03-23 NOTE — Therapy (Signed)
[facility] [address] [place], [place], [address] Phone: [phone]   Fax:  [phone]  Occupational Therapy Evaluation  Patient Details  Name: Dustin Schneider MRN: [id number] Date of Birth: 05/12/1956 No data recorded  Encounter Date: 03/23/2020   OT End of Session - 03/23/20 1226    Visit Number 1    Number of Visits 25    Date for OT Re-Evaluation 06/23/20    Authorization Type BC/BS - No auth required, covered 100%    OT Start Time 0800    OT Stop Time 0845    OT Time Calculation (min) 45 min    Activity Tolerance Patient tolerated treatment well    Behavior During Therapy [facility] for tasks assessed/performed           Past Medical History:  Diagnosis Date  . Hypertension   . NICM (nonischemic cardiomyopathy) ([place]) 06/15/2018   04/23/18: EF 45%, 09/15/18: NOrmal LVEF  . Paroxysmal atrial fibrillation ([place]) 06/15/2018  . Stroke ([place])   . Visit for monitoring Tikosyn therapy 06/12/2018    Past Surgical History:  Procedure Laterality Date  . BUBBLE STUDY  03/11/2020   Procedure: BUBBLE STUDY;  Surgeon: [name], MD;  Location: [place];  Service: Cardiovascular;;  . CARDIOVERSION N/A 05/13/2018   Procedure: CARDIOVERSION;  Surgeon: [name], MD;  Location: [facility] ENDOSCOPY;  Service: Cardiovascular;  Laterality: N/A;  . CARDIOVERSION N/A 06/13/2018   Procedure: CARDIOVERSION;  Surgeon: [name], MD;  Location: [facility] ENDOSCOPY;  Service: Cardiovascular;  Laterality: N/A;  . CARDIOVERSION N/A 06/23/2018   Procedure: CARDIOVERSION;  Surgeon: [name], MD;  Location: [facility] ENDOSCOPY;  Service: Cardiovascular;  Laterality: N/A;  . CARDIOVERSION N/A 03/11/2020   Procedure: CARDIOVERSION;  Surgeon: [name], MD;  Location: [facility];  Service: Cardiovascular;  Laterality: N/A;  . excision of actinic keratosis    . EYE SURGERY     eye lift  . IR CT HEAD LTD  01/27/2020  . IR PERCUTANEOUS ART  THROMBECTOMY/INFUSION INTRACRANIAL INC DIAG ANGIO  01/27/2020      . IR PERCUTANEOUS ART THROMBECTOMY/INFUSION INTRACRANIAL INC DIAG ANGIO  01/27/2020  . RADIOLOGY WITH ANESTHESIA N/A 01/27/2020   Procedure: IR WITH ANESTHESIA;  Surgeon: Radiologist, Medication, MD;  Location: [facility];  Service: Radiology;  Laterality: N/A;  . TEE WITHOUT CARDIOVERSION N/A 03/11/2020   Procedure: TRANSESOPHAGEAL ECHOCARDIOGRAM (TEE);  Surgeon: [name], MD;  Location: [facility];  Service: Cardiovascular;  Laterality: N/A;  . TONSILLECTOMY    . WISDOM TOOTH EXTRACTION      There were no vitals filed for this visit.   Subjective Assessment - 03/23/20 0804    Patient is accompanied by: Family member   BROTHER   Pertinent History Lt MCA CVA 01/27/20, readmitted to hospital with HAP and fever on 03/06/20 and released 03/11/20. PMH: paroxsymal A-fib, HTN    Limitations fall    Currently in Pain? No/denies             [facility] OT Assessment - 03/23/20 0001      Assessment   Medical Diagnosis L MCA CVA    Onset Date/Surgical Date 01/27/20    Hand Dominance Right    Next MD Visit 03/17/20 with Dr. Posey Pronto    Prior Therapy CIR with PT, OT, ST      Precautions   Precautions Fall    Precaution Comments dense R hemiparesis      Balance Screen   Has the patient fallen in  the past 6 months Yes    How many times? 1      Home  Environment   Bathroom Shower/Tub Tub/Shower unit    Psychologist, occupational - manual;Bedside commode;Tub bench    Additional Comments Brother currently living with patient. Girlfriend also assist. Pt lives in 1 story home with ramp to enter. 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[employer], used to run, walk the dog, sculpting       ADL   Eating/Feeding Needs  assist with cutting food   using Lt non dominant hand   Grooming Set up   for teeth, not shaving   Upper Body Bathing Maximal assistance    Upper Body Bathing: Patient Percentage --   sponge bathing   Lower Body Bathing Maximal assistance   sponge bathing   Upper Body Dressing Minimal assistance    Lower Body Dressing Maximal assistance    Toilet Transfer Moderate assistance   using BSC (can't get through bathroom door w/ w/c)   Toileting - Clothing Manipulation Moderate assistance    Toileting -  Hygiene Increase time    Tub/Shower Transfer --   currently not using d/t bathroom door too narrow   ADL comments Brother has been staying with patient (he lives in [place]). Pt dependent for all IADLS      Mobility   Mobility Status Needs assist      Written Expression   Dominant Hand Right    Handwriting --   UNABLE     Vision - History   Baseline Vision Wears glasses only for reading      Vision Assessment   Comment Denies change      Cognition   Cognition Comments Pt with expressive aphasia, receptive appears to be WFL's      Observation/Other Assessments   Observations Pt arrives in w/c, expressive aphasia, pt has some A/ROM RUE      Posture/Postural Control   Posture/Postural Control Postural limitations    Postural Limitations Rounded Shoulders;Posterior pelvic tilt   Scapula movement 50%     Sensation   Light Touch Impaired by gross assessment    Proprioception Impaired by gross assessment      Coordination   Coordination unable to perform coordination activities. Pt could pick up blocks w/ great effort and max difficulty but unable to perform Box & Blocks test      Perception   Perception Impaired    Inattention/Neglect Impaired - to be further tested in functual context   possible inattention to RUE     Edema   Edema Moderate Rt hand      Tone   Assessment Location Right Upper Extremity      ROM / Strength   AROM / PROM / Strength AROM;PROM      AROM    Overall AROM Comments RUE sh flexion to 70*, ER 75%, IR 25%, full elbow flex, supination and wrist ext 75%, composite finger flex 60%, composite finger ext 90%      PROM   Overall PROM Comments Pt as 90% passive shoulder flexion w/ no pain      Strength   Overall Strength Comments grossly 3-/5 RUE (lacks full ROM against  gravity)       RUE Tone   RUE Tone Mild                             OT Short Term Goals - 03/23/20 1342      OT SHORT TERM GOAL #1   Title Independent with initial HEP    Time 4    Period Weeks    Status New      OT SHORT TERM GOAL #2   Title Pt to perform low to mid level reaching of light objects RUE in prep for feeding self and bathing    Time 4    Period Weeks    Status New      OT SHORT TERM GOAL #3   Title Pt to feed self 50% of the time with Rt dominant hand and A/E prn    Time 4    Period Weeks    Status New      OT SHORT TERM GOAL #4   Title Pt to perform UE dressing of pullover shirt mod I level    Time 4    Period Weeks    Status New      OT SHORT TERM GOAL #5   Title Pt to perform LE dressing and all bathing with no more than mod assist using A/E and strategies prn    Time 4    Period Weeks    Status New      Additional Short Term Goals   Additional Short Term Goals Yes      OT SHORT TERM GOAL #6   Title Pt to perform toileting and toilet transfers with no more than min assist overall    Time 4    Period Weeks    Status New      OT SHORT TERM GOAL #7   Title Improve functional use of RUE as evidenced by performing 8 blocks on Box & Blocks test    Baseline 0    Time 4    Period Weeks    Status New             OT Long Term Goals - 03/23/20 1346      OT LONG TERM GOAL #1   Title Pt to be independent with updated HEP    Time 12    Period Weeks    Status New      OT LONG TERM GOAL #2   Title Pt to perform all BADLS at mod I level    Time 12    Period Weeks    Status New      OT LONG TERM GOAL #3    Title Pt to feed self 75% or greater with Rt dominant hand and grooming 50% or more with Rt hand    Time 12    Period Weeks    Status New      OT LONG TERM GOAL #4   Title Pt to retrieve light weight objects from high shelf RUE consistently    Time 12    Period Weeks    Status New      OT LONG TERM GOAL #5   Title Pt to demo 25 lbs grip strength or more Rt dominant hand for gripping, opening jars/containers    Time 12    Period Weeks    Status New      Long Term Additional Goals   Additional Long Term  Goals Yes      OT LONG TERM GOAL #6   Title Pt to perform simple meal prep and light cleaning tasks mod I level    Time 12    Period Weeks    Status New      OT LONG TERM GOAL #7   Title Improve functional use RUE as evidenced by performing 25 blocks on Box & Blocks and performing 9 hole peg test in 90 sec or under    Time 12    Period Weeks    Status New      OT LONG TERM GOAL #8   Title Pt to returned to modified art/sculpting tasks with task modifications and A/E prn    Time 12    Period Weeks    Status New                 Plan - 03/23/20 1227    Clinical Impression Statement Pt is a 64 y.o. male who presents to [facility] s/p Lt MCA CVA on 01/27/20 with Rt dominant side hemiplegia. Pt with decreased ability to perform ADLS, decreased ROM, sensation, strength, coordination RUE. Pt would benefit from O.T. to address these deficits and increase independence with ADLS/IADLS and work/leisure tasks.    OT Occupational Profile and History Detailed Assessment- Review of Records and additional review of physical, cognitive, psychosocial history related to current functional performance    Occupational performance deficits (Please refer to evaluation for details): ADL's;IADL's;Work;Leisure;Social Participation    Body Structure / Function / Physical Skills ADL;ROM;Dexterity;Balance;IADL;Body mechanics;Improper spinal/pelvic  alignment;Sensation;Mobility;Flexibility;Strength;Coordination;FMC;Tone;UE functional use;Decreased knowledge of use of DME;Proprioception    Rehab Potential Good    Clinical Decision Making Several treatment options, min-mod task modification necessary    Comorbidities Affecting Occupational Performance: May have comorbidities impacting occupational performance    Modification or Assistance to Complete Evaluation  Min-Moderate modification of tasks or assist with assess necessary to complete eval    OT Frequency 2x / week    OT Duration 12 weeks   pluse eval   OT Treatment/Interventions Self-care/ADL training;Therapeutic exercise;Functional Mobility Training;Neuromuscular education;Manual Therapy;Splinting;Aquatic Therapy;Manual lymph drainage;Therapeutic activities;Coping strategies training;DME and/or AE instruction;Electrical Stimulation;Fluidtherapy;Passive range of motion;Patient/family education    Plan initiate HEP for Rt shoulder and hand, simple functional tasks for RUE   Consulted and Agree with Plan of Care Patient;Family member/caregiver    Family Member Consulted brother           Patient will benefit from skilled therapeutic intervention in order to improve the following deficits and impairments:   Body Structure / Function / Physical Skills: ADL, ROM, Dexterity, Balance, IADL, Body mechanics, Improper spinal/pelvic alignment, Sensation, Mobility, Flexibility, Strength, Coordination, FMC, Tone, UE functional use, Decreased knowledge of use of DME, Proprioception       Visit Diagnosis: Hemiplegia and hemiparesis following cerebral infarction affecting right dominant side (HCC)  Muscle weakness (generalized)  Other lack of coordination  Unsteadiness on feet    Problem List Patient Active Problem List   Diagnosis Date Noted  . Neurogenic bladder 03/17/2020  . Sepsis due to pneumonia ([place]) 03/06/2020  . History of stroke with residual deficit 03/06/2020  . Transient  hypotension 03/06/2020  . Spastic hemiplegia affecting nondominant side ([place])   . History of hypertension   . Expressive aphasia   . AKI (acute kidney injury) ([place])   . Global aphasia   . Hypoalbuminemia due to protein-calorie malnutrition ([place])   . Dysphagia, post-stroke   . Hyperlipidemia 02/02/2020  . Dysphagia following cerebral  infarction 02/02/2020  . Aspiration pneumonia ([place]) 02/02/2020  . Acute ischemic left MCA stroke ([place]) 02/02/2020  . Acute respiratory failure ([place])   . Acute ischemic stroke [facility]) s/p clot retrieval L MCA & ACA A3 01/27/2020  . Aspiration into airway 01/27/2020  . Chronic anticoagulation 01/27/2020  . Paroxysmal atrial fibrillation ([place]) 06/15/2018  . Essential hypertension 06/15/2018  . NICM (nonischemic cardiomyopathy) ([place]) 06/15/2018  . Visit for monitoring Tikosyn therapy 06/12/2018    Carey Bullocks, OTR/L 03/23/2020, 1:51 PM  [facility] [address] [place], [place], [address] Phone: [phone]   Fax:  [phone]  Name: ZADIN LANGE MRN: [id number] Date of Birth: November 18, 1955

## 2020-03-24 NOTE — Therapy (Signed)
[facility] [address] [place], [place], [address] Phone: [phone]   Fax:  [phone]  Physical Therapy Treatment  Patient Details  Name: PINK MAYE MRN: [id number] Date of Birth: 10-Apr-1956 Referring Provider (PT): Lauraine Rinne, PA-C (will be followed by Dr. Posey Pronto)   Encounter Date: 03/23/2020    03/23/20 0722  PT Visits / Re-Eval  Visit Number 3  Number of Visits 25  Date for PT Re-Evaluation 06/14/20  Authorization  Authorization Type BCBS  PT Time Calculation  PT Start Time 0719  PT Stop Time 0759  PT Time Calculation (min) 40 min  PT - End of Session  Equipment Utilized During Treatment Gait belt  Activity Tolerance Patient tolerated treatment well  Behavior During Therapy [facility] for tasks assessed/performed    Past Medical History:  Diagnosis Date  . Hypertension   . NICM (nonischemic cardiomyopathy) ([place]) 06/15/2018   04/23/18: EF 45%, 09/15/18: NOrmal LVEF  . Paroxysmal atrial fibrillation ([place]) 06/15/2018  . Stroke ([place])   . Visit for monitoring Tikosyn therapy 06/12/2018    Past Surgical History:  Procedure Laterality Date  . BUBBLE STUDY  03/11/2020   Procedure: BUBBLE STUDY;  Surgeon: [name], MD;  Location: [facility];  Service: Cardiovascular;;  . CARDIOVERSION N/A 05/13/2018   Procedure: CARDIOVERSION;  Surgeon: [name], MD;  Location: [facility] ENDOSCOPY;  Service: Cardiovascular;  Laterality: N/A;  . CARDIOVERSION N/A 06/13/2018   Procedure: CARDIOVERSION;  Surgeon: [name], MD;  Location: [facility] ENDOSCOPY;  Service: Cardiovascular;  Laterality: N/A;  . CARDIOVERSION N/A 06/23/2018   Procedure: CARDIOVERSION;  Surgeon: [name], MD;  Location: [facility] ENDOSCOPY;  Service: Cardiovascular;  Laterality: N/A;  . CARDIOVERSION N/A 03/11/2020   Procedure: CARDIOVERSION;  Surgeon: [name], MD;  Location: [place];  Service: Cardiovascular;  Laterality: N/A;  . excision of actinic  keratosis    . EYE SURGERY     eye lift  . IR CT HEAD LTD  01/27/2020  . IR PERCUTANEOUS ART THROMBECTOMY/INFUSION INTRACRANIAL INC DIAG ANGIO  01/27/2020      . IR PERCUTANEOUS ART THROMBECTOMY/INFUSION INTRACRANIAL INC DIAG ANGIO  01/27/2020  . RADIOLOGY WITH ANESTHESIA N/A 01/27/2020   Procedure: IR WITH ANESTHESIA;  Surgeon: Radiologist, Medication, MD;  Location: [place];  Service: Radiology;  Laterality: N/A;  . TEE WITHOUT CARDIOVERSION N/A 03/11/2020   Procedure: TRANSESOPHAGEAL ECHOCARDIOGRAM (TEE);  Surgeon: [name], MD;  Location: [facility];  Service: Cardiovascular;  Laterality: N/A;  . TONSILLECTOMY    . WISDOM TOOTH EXTRACTION      There were no vitals filed for this visit.     03/23/20 0721  Symptoms/Limitations  Subjective No new complaints. No falls or pain. Reports the HEP is going well at home.  Patient is accompained by: Family member ([name], Brother in Sports coach)  Pertinent History PMH: HTN, paroxysmal a fib  Limitations Standing;Walking  Patient Stated Goals "wants everything working on R side again"  Pain Assessment  Currently in Pain? No/denies  Pain Score 0      03/23/20 0742  Transfers  Transfers Sit to Stand;Stand to Sit;Stand Pivot Transfers  Sit to Stand 4: Min guard;4: Min assist;With upper extremity assist;From bed;From chair/3-in-1  Sit to Stand Details Verbal cues for technique;Verbal cues for safe use of DME/AE;Manual facilitation for weight shifting  Sit to Stand Details (indicate cue type and reason) cues to scoot to edge, for anterior weight shifting and bil foot placement with standing  Stand to Sit 4: Min  guard;4: Min assist;With upper extremity assist;To chair/3-in-1;To bed;Uncontrolled descent  Stand to Sit Details (indicate cue type and reason) Verbal cues for sequencing;Verbal cues for safe use of DME/AE;Manual facilitation for weight shifting;Manual facilitation for placement  Stand to Sit Details cues to unstrap left hand from hand  orthotic, to reach back and for use of right UE to controll descent with sitting down.   Stand Pivot Transfers 4: Min assist  Stand Pivot Transfer Details (indicate cue type and reason) from wheelchair to mat table with PTA blocking left LE in stance, cues to advance feet and sequencing with transfer.   Ambulation/Gait  Ambulation/Gait Yes  Ambulation/Gait Assistance 4: Min assist (of 2 people for safety)  Ambulation/Gait Assistance Details used anterior ottoback with t-strap to left LE with all gait. assit needed for step placement and blocking of knee in stance at times. use of left hand orthotic as well to secure left hand on walker. second person for safety/balance with gait. cues needed also on sequencing with gait, step length, weight shifting over left LE in stance and posture. occasional assist needed to steer/negotiate walker.   Ambulation Distance (Feet) 25 Feet (x1, 90 x1)  Assistive device Rolling walker (right hand orthotic, right AFO)  Gait Pattern Step-to pattern;Decreased stride length;Decreased stance time - right;Decreased step length - right;Decreased hip/knee flexion - right;Decreased weight shift to right;Right steppage;Lateral hip instability;Decreased trunk rotation;Narrow base of support;Poor foot clearance - right  Ambulation Surface Level;Indoor          PT Short Term Goals - 03/16/20 1136      PT SHORT TERM GOAL #1   Title Pt and pt's family members will be independent with initial HEP in order to build upon functional gains made in therapy. ALL LTGS DUE 04/13/20    Time 4    Period Weeks    Status New    Target Date 04/13/20      PT SHORT TERM GOAL #2   Title Pt will perform sit > supine with supervision and supine > sit with min guard for bed mobility in order to decr caregiver burden.    Time 4    Period Weeks    Status New      PT SHORT TERM GOAL #3   Title Pt will undergo assessment of AFO for functional transfers and gait.    Time 4    Period  Weeks    Status New      PT SHORT TERM GOAL #4   Title Pt will ambulate 38' with RW with mod A x1 in order to improve functional mobility.    Time 4    Period Weeks    Status New      PT SHORT TERM GOAL #5   Title Pt will perform sit <> stand with RW vs. Stedy with min A in order to improve functional BLE strength for transfers.    Time 4    Period Weeks    Status New      Additional Short Term Goals   Additional Short Term Goals Yes      PT SHORT TERM GOAL #6   Title Pt will perform squat pivot transfers from w/c <> mat table with min guard.    Baseline min A for blocking R knee    Time 4    Period Weeks    Status New             PT Long Term Goals - 03/16/20 1139  PT LONG TERM GOAL #1   Title Pt and pt's family members will be independent with final HEP in order to build upon functional gains made in therapy. ALL LTGS DUE 06/08/20    Time 12    Period Weeks    Status New    Target Date 06/08/20      PT LONG TERM GOAL #2   Title Pt will ambulate 115' with min guard/min A with RW and R AFO in order to improve household mobility.    Time 12    Period Weeks    Status New      PT LONG TERM GOAL #3   Title Pt will perform sit <> stand with RW with min guard in order to improve functional BLE strength for transfers.    Time 12    Period Weeks    Status New      PT LONG TERM GOAL #4   Title Pt will perform squat pivot transfer with mod I vs. stand pivot transfer with RW with min guard in order to decr caregiver burden.    Time 12    Period Weeks    Status New             03/23/20 0722  Plan  Clinical Impression Statement Today's skilled session focused on trial of an AFO to left LE for improved stance control and clearance with swing phase of gait. Pt able to increase his gait distance with less overall assistance needed vs with CIR (last time pt ambulated). The pt is making steady progress toward goals and should benefit from continued PT to progress  toward unmet goals.  Personal Factors and Comorbidities Comorbidity 3+  Comorbidities dense L MCA CVA, paroxysmal A. fib on chronic Xarelto, nonischemic cardiomyopathy and hypertension.  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, works as an Gaffer)  Pt will benefit from skilled therapeutic intervention in order to improve on the following deficits Abnormal gait;Decreased activity tolerance;Decreased balance;Decreased cognition;Decreased mobility;Decreased coordination;Decreased range of motion;Decreased endurance;Decreased strength;Hypomobility;Difficulty walking;Impaired UE functional use;Impaired vision/preception;Postural dysfunction;Impaired sensation  Stability/Clinical Decision Making Evolving/Moderate complexity  Rehab Potential Good  PT Frequency 2x / week  PT Duration 12 weeks  PT Treatment/Interventions ADLs/Self Care Home Management;Aquatic Therapy;DME Instruction;Gait training;Stair training;Functional mobility training;Therapeutic activities;Therapeutic exercise;Electrical Stimulation;Balance training;Neuromuscular re-education;Wheelchair mobility training;Orthotic Fit/Training;Patient/family education;Passive range of motion;Energy conservation  PT Next Visit Plan continue gait training with brace, t-strap and hand orthotic; set up bioness to LE for strengthening/muscle re-ed  Consulted and Agree with Plan of Care Patient;Family member/caregiver  Family Member Consulted brother in Art gallery manager      Patient will benefit from skilled therapeutic intervention in order to improve the following deficits and impairments:  Abnormal gait, Decreased activity tolerance, Decreased balance, Decreased cognition, Decreased mobility, Decreased coordination, Decreased range of motion, Decreased endurance, Decreased  strength, Hypomobility, Difficulty walking, Impaired UE functional use, Impaired vision/preception, Postural dysfunction, Impaired sensation  Visit Diagnosis: Other abnormalities of gait and mobility  Muscle weakness (generalized)  Hemiplegia and hemiparesis following cerebral infarction affecting right dominant side [facility])     Problem List Patient Active Problem List   Diagnosis Date Noted  . Neurogenic bladder 03/17/2020  . Sepsis due to pneumonia ([place]) 03/06/2020  . History of stroke with residual deficit 03/06/2020  . Transient hypotension 03/06/2020  . Spastic hemiplegia affecting nondominant side ([place])   . History of hypertension   . Expressive aphasia   . AKI (acute kidney injury) ([place])   .  Global aphasia   . Hypoalbuminemia due to protein-calorie malnutrition ([place])   . Dysphagia, post-stroke   . Hyperlipidemia 02/02/2020  . Dysphagia following cerebral infarction 02/02/2020  . Aspiration pneumonia ([place]) 02/02/2020  . Acute ischemic left MCA stroke ([place]) 02/02/2020  . Acute respiratory failure ([place])   . Acute ischemic stroke [facility]) s/p clot retrieval L MCA & ACA A3 01/27/2020  . Aspiration into airway 01/27/2020  . Chronic anticoagulation 01/27/2020  . Paroxysmal atrial fibrillation ([place]) 06/15/2018  . Essential hypertension 06/15/2018  . NICM (nonischemic cardiomyopathy) ([place]) 06/15/2018  . Visit for monitoring Tikosyn therapy 06/12/2018    Willow Ora, PTA, [facility] [address] [place], [place] [address] [phone] 03/24/20, 8:23 PM   Name: THEOPHIL THIVIERGE MRN: [id number] Date of Birth: 1956/04/12

## 2020-03-25 ENCOUNTER — Ambulatory Visit: Payer: BC Managed Care – PPO | Admitting: Physical Therapy

## 2020-03-25 ENCOUNTER — Telehealth: Payer: Self-pay | Admitting: *Deleted

## 2020-03-25 ENCOUNTER — Ambulatory Visit: Payer: BC Managed Care – PPO | Admitting: Occupational Therapy

## 2020-03-25 ENCOUNTER — Other Ambulatory Visit: Payer: Self-pay

## 2020-03-25 DIAGNOSIS — R2689 Other abnormalities of gait and mobility: Secondary | ICD-10-CM

## 2020-03-25 DIAGNOSIS — R278 Other lack of coordination: Secondary | ICD-10-CM

## 2020-03-25 DIAGNOSIS — I69351 Hemiplegia and hemiparesis following cerebral infarction affecting right dominant side: Secondary | ICD-10-CM

## 2020-03-25 DIAGNOSIS — M6281 Muscle weakness (generalized): Secondary | ICD-10-CM

## 2020-03-25 DIAGNOSIS — R262 Difficulty in walking, not elsewhere classified: Secondary | ICD-10-CM

## 2020-03-25 DIAGNOSIS — R2681 Unsteadiness on feet: Secondary | ICD-10-CM

## 2020-03-25 NOTE — Telephone Encounter (Signed)
Mr Dustin Schneider is calling to check on status of disability papers, and requests a call back.

## 2020-03-25 NOTE — Patient Instructions (Signed)
SHOULDER: Flexion On Table    Place hands on table, elbows straight slides arms forward on towel . Move towel forwards and backwards the side to side. . Hold _5__ seconds. _10-20__ reps per set, _2__ sets per day, __7_ days per week   Copyright  VHI. All rights reserved.      Laying on your right side with arm extended on the bed , bend and straighten elbow gently 10 reps, turn palm up and down 10 reps 2x day Lateral Weight Shift: Upper Trunk Leading   Sit with feet flat on floor. Bring ____ shoulder, head and arm toward side until forearm/elbow just touches sitting surface. Hold __10__ seconds. Return to upright position by pushing through arm Repeat _10___ times per session. Do __2__ sessions per day.   Practice opening and closing your hand 10 reps 2x day  .

## 2020-03-25 NOTE — Therapy (Signed)
[facility] [address] [place], [place], [address] Phone: [phone]   Fax:  [phone]  Occupational Therapy Treatment  Patient Details  Name: Dustin Schneider MRN: [id number] Date of Birth: Sep 04, 1955 No data recorded  Encounter Date: 03/25/2020   OT End of Session - 03/25/20 1345    Visit Number 3    Number of Visits 25    Date for OT Re-Evaluation 06/23/20    Authorization Type BC/BS - No auth required, covered 100%    OT Start Time 0934    OT Stop Time 1015    OT Time Calculation (min) 41 min    Activity Tolerance Patient tolerated treatment well    Behavior During Therapy [facility] for tasks assessed/performed           Past Medical History:  Diagnosis Date  . Hypertension   . NICM (nonischemic cardiomyopathy) ([place]) 06/15/2018   04/23/18: EF 45%, 09/15/18: NOrmal LVEF  . Paroxysmal atrial fibrillation ([place]) 06/15/2018  . Stroke ([place])   . Visit for monitoring Tikosyn therapy 06/12/2018    Past Surgical History:  Procedure Laterality Date  . BUBBLE STUDY  03/11/2020   Procedure: BUBBLE STUDY;  Surgeon: [name], MD;  Location: [facility];  Service: Cardiovascular;;  . CARDIOVERSION N/A 05/13/2018   Procedure: CARDIOVERSION;  Surgeon: [name], MD;  Location: [facility] ENDOSCOPY;  Service: Cardiovascular;  Laterality: N/A;  . CARDIOVERSION N/A 06/13/2018   Procedure: CARDIOVERSION;  Surgeon: [name], MD;  Location: [facility] ENDOSCOPY;  Service: Cardiovascular;  Laterality: N/A;  . CARDIOVERSION N/A 06/23/2018   Procedure: CARDIOVERSION;  Surgeon: [name], MD;  Location: [facility] ENDOSCOPY;  Service: Cardiovascular;  Laterality: N/A;  . CARDIOVERSION N/A 03/11/2020   Procedure: CARDIOVERSION;  Surgeon: [name], MD;  Location: [facility];  Service: Cardiovascular;  Laterality: N/A;  . excision of actinic keratosis    . EYE SURGERY     eye lift  . IR CT HEAD LTD  01/27/2020  . IR PERCUTANEOUS ART  THROMBECTOMY/INFUSION INTRACRANIAL INC DIAG ANGIO  01/27/2020      . IR PERCUTANEOUS ART THROMBECTOMY/INFUSION INTRACRANIAL INC DIAG ANGIO  01/27/2020  . RADIOLOGY WITH ANESTHESIA N/A 01/27/2020   Procedure: IR WITH ANESTHESIA;  Surgeon: Radiologist, Medication, MD;  Location: [facility];  Service: Radiology;  Laterality: N/A;  . TEE WITHOUT CARDIOVERSION N/A 03/11/2020   Procedure: TRANSESOPHAGEAL ECHOCARDIOGRAM (TEE);  Surgeon: [name], MD;  Location: [facility];  Service: Cardiovascular;  Laterality: N/A;  . TONSILLECTOMY    . WISDOM TOOTH EXTRACTION      There were no vitals filed for this visit.   Subjective Assessment - 03/25/20 0937    Patient is accompanied by: Family member   BROTHER   Pertinent History Lt MCA CVA 01/27/20, readmitted to hospital with HAP and fever on 03/06/20 and released 03/11/20. PMH: paroxsymal A-fib, HTN    Limitations fall    Currently in Pain? No/denies                  Treatment: supine closed chain AA/ROM chest press and shoulder flexion with ball, mod facilitation/ v.c for shoulder positioning. Pt transferred w/c to mat and mat to w/c min A scoot pivot.              OT Education - 03/25/20 1344    Education Details Pt/ girlfriend were educated regarding inital HEP. see pt instructions.    Person(s) Educated Patient;Caregiver(s)    Methods Explanation;Demonstration;Verbal cues;Handout  Comprehension Verbalized understanding;Returned demonstration;Verbal cues required;Tactile cues required            OT Short Term Goals - 03/23/20 1342      OT SHORT TERM GOAL #1   Title Independent with initial HEP    Time 4    Period Weeks    Status New      OT SHORT TERM GOAL #2   Title Pt to perform low to mid level reaching of light objects RUE in prep for feeding self and bathing    Time 4    Period Weeks    Status New      OT SHORT TERM GOAL #3   Title Pt to feed self 50% of the time with Rt dominant hand and A/E prn    Time 4      Period Weeks    Status New      OT SHORT TERM GOAL #4   Title Pt to perform UE dressing of pullover shirt mod I level    Time 4    Period Weeks    Status New      OT SHORT TERM GOAL #5   Title Pt to perform LE dressing and all bathing with no more than mod assist using A/E and strategies prn    Time 4    Period Weeks    Status New      Additional Short Term Goals   Additional Short Term Goals Yes      OT SHORT TERM GOAL #6   Title Pt to perform toileting and toilet transfers with no more than min assist overall    Time 4    Period Weeks    Status New      OT SHORT TERM GOAL #7   Title Improve functional use of RUE as evidenced by performing 8 blocks on Box & Blocks test    Baseline 0    Time 4    Period Weeks    Status New             OT Long Term Goals - 03/23/20 1346      OT LONG TERM GOAL #1   Title Pt to be independent with updated HEP    Time 12    Period Weeks    Status New      OT LONG TERM GOAL #2   Title Pt to perform all BADLS at mod I level    Time 12    Period Weeks    Status New      OT LONG TERM GOAL #3   Title Pt to feed self 75% or greater with Rt dominant hand and grooming 50% or more with Rt hand    Time 12    Period Weeks    Status New      OT LONG TERM GOAL #4   Title Pt to retrieve light weight objects from high shelf RUE consistently    Time 12    Period Weeks    Status New      OT LONG TERM GOAL #5   Title Pt to demo 25 lbs grip strength or more Rt dominant hand for gripping, opening jars/containers    Time 12    Period Weeks    Status New      Long Term Additional Goals   Additional Long Term Goals Yes      OT LONG TERM GOAL #6   Title Pt to perform simple meal prep and  light cleaning tasks mod I level    Time 12    Period Weeks    Status New      OT LONG TERM GOAL #7   Title Improve functional use RUE as evidenced by performing 25 blocks on Box & Blocks and performing 9 hole peg test in 90 sec or under    Time  12    Period Weeks    Status New      OT LONG TERM GOAL #8   Title Pt to returned to modified art/sculpting tasks with task modifications and A/E prn    Time 12    Period Weeks    Status New                 Plan - 03/25/20 0936    Clinical Impression Statement Pt is progressing towardsgoals for inital HEP.    OT Occupational Profile and History Detailed Assessment- Review of Records and additional review of physical, cognitive, psychosocial history related to current functional performance    Occupational performance deficits (Please refer to evaluation for details): ADL's;IADL's;Work;Leisure;Social Participation    Body Structure / Function / Physical Skills ADL;ROM;Dexterity;Balance;IADL;Body mechanics;Improper spinal/pelvic alignment;Sensation;Mobility;Flexibility;Strength;Coordination;FMC;Tone;UE functional use;Decreased knowledge of use of DME;Proprioception    Rehab Potential Good    Clinical Decision Making Several treatment options, min-mod task modification necessary    Comorbidities Affecting Occupational Performance: May have comorbidities impacting occupational performance    Modification or Assistance to Complete Evaluation  Min-Moderate modification of tasks or assist with assess necessary to complete eval    OT Frequency 2x / week    OT Duration 12 weeks   pluse eval   OT Treatment/Interventions Self-care/ADL training;Therapeutic exercise;Functional Mobility Training;Neuromuscular education;Manual Therapy;Splinting;Aquatic Therapy;Manual lymph drainage;Therapeutic activities;Coping strategies training;DME and/or AE instruction;Electrical Stimulation;Fluidtherapy;Passive range of motion;Patient/family education    Plan review HEP    Consulted and Agree with Plan of Care Patient;Family member/caregiver    Family Member Consulted brother           Patient will benefit from skilled therapeutic intervention in order to improve the following deficits and impairments:     Body Structure / Function / Physical Skills: ADL, ROM, Dexterity, Balance, IADL, Body mechanics, Improper spinal/pelvic alignment, Sensation, Mobility, Flexibility, Strength, Coordination, FMC, Tone, UE functional use, Decreased knowledge of use of DME, Proprioception       Visit Diagnosis: Other abnormalities of gait and mobility  Muscle weakness (generalized)  Hemiplegia and hemiparesis following cerebral infarction affecting right dominant side (HCC)  Other lack of coordination    Problem List Patient Active Problem List   Diagnosis Date Noted  . Neurogenic bladder 03/17/2020  . Sepsis due to pneumonia ([place]) 03/06/2020  . History of stroke with residual deficit 03/06/2020  . Transient hypotension 03/06/2020  . Spastic hemiplegia affecting nondominant side ([place])   . History of hypertension   . Expressive aphasia   . AKI (acute kidney injury) ([place])   . Global aphasia   . Hypoalbuminemia due to protein-calorie malnutrition ([place])   . Dysphagia, post-stroke   . Hyperlipidemia 02/02/2020  . Dysphagia following cerebral infarction 02/02/2020  . Aspiration pneumonia ([place]) 02/02/2020  . Acute ischemic left MCA stroke ([place]) 02/02/2020  . Acute respiratory failure ([place])   . Acute ischemic stroke [facility]) s/p clot retrieval L MCA & ACA A3 01/27/2020  . Aspiration into airway 01/27/2020  . Chronic anticoagulation 01/27/2020  . Paroxysmal atrial fibrillation ([place]) 06/15/2018  . Essential hypertension 06/15/2018  . NICM (nonischemic cardiomyopathy) ([place]) 06/15/2018  .  Visit for monitoring Tikosyn therapy 06/12/2018    Jorgina Binning 03/25/2020, 1:46 PM  [facility] [address] [place], [place], [address] Phone: [phone]   Fax:  [phone]  Name: Dustin Schneider MRN: [id number] Date of Birth: 1956-02-15

## 2020-03-25 NOTE — Therapy (Signed)
[facility] [address] [place], [place], [address] Phone: [phone]   Fax:  [phone]  Physical Therapy Treatment  Patient Details  Name: Dustin Schneider MRN: [id number] Date of Birth: 01/24/1956 Referring Provider (PT): Lauraine Rinne, PA-C (will be followed by Dr. Posey Pronto)   Encounter Date: 03/25/2020   PT End of Session - 03/25/20 1123    Visit Number 4    Number of Visits 25    Date for PT Re-Evaluation 06/14/20    Authorization Type BCBS    PT Start Time 0845    PT Stop Time 0928    PT Time Calculation (min) 43 min    Equipment Utilized During Treatment Gait belt    Activity Tolerance Patient tolerated treatment well    Behavior During Therapy [facility] for tasks assessed/performed           Past Medical History:  Diagnosis Date  . Hypertension   . NICM (nonischemic cardiomyopathy) ([place]) 06/15/2018   04/23/18: EF 45%, 09/15/18: NOrmal LVEF  . Paroxysmal atrial fibrillation ([place]) 06/15/2018  . Stroke ([place])   . Visit for monitoring Tikosyn therapy 06/12/2018    Past Surgical History:  Procedure Laterality Date  . BUBBLE STUDY  03/11/2020   Procedure: BUBBLE STUDY;  Surgeon: [name], MD;  Location: [facility];  Service: Cardiovascular;;  . CARDIOVERSION N/A 05/13/2018   Procedure: CARDIOVERSION;  Surgeon: [name], MD;  Location: [name] ENDOSCOPY;  Service: Cardiovascular;  Laterality: N/A;  . CARDIOVERSION N/A 06/13/2018   Procedure: CARDIOVERSION;  Surgeon: [name], MD;  Location: [facility] ENDOSCOPY;  Service: Cardiovascular;  Laterality: N/A;  . CARDIOVERSION N/A 06/23/2018   Procedure: CARDIOVERSION;  Surgeon: [name], MD;  Location: [facility] ENDOSCOPY;  Service: Cardiovascular;  Laterality: N/A;  . CARDIOVERSION N/A 03/11/2020   Procedure: CARDIOVERSION;  Surgeon: [name], MD;  Location: [facility];  Service: Cardiovascular;  Laterality: N/A;  . excision of actinic keratosis    . EYE SURGERY      eye lift  . IR CT HEAD LTD  01/27/2020  . IR PERCUTANEOUS ART THROMBECTOMY/INFUSION INTRACRANIAL INC DIAG ANGIO  01/27/2020      . IR PERCUTANEOUS ART THROMBECTOMY/INFUSION INTRACRANIAL INC DIAG ANGIO  01/27/2020  . RADIOLOGY WITH ANESTHESIA N/A 01/27/2020   Procedure: IR WITH ANESTHESIA;  Surgeon: Radiologist, Medication, MD;  Location: [facility];  Service: Radiology;  Laterality: N/A;  . TEE WITHOUT CARDIOVERSION N/A 03/11/2020   Procedure: TRANSESOPHAGEAL ECHOCARDIOGRAM (TEE);  Surgeon: [name], MD;  Location: [facility];  Service: Cardiovascular;  Laterality: N/A;  . TONSILLECTOMY    . WISDOM TOOTH EXTRACTION      There were no vitals filed for this visit.   Subjective Assessment - 03/25/20 0848    Subjective No changes since last time.    Patient is accompained by: Family member   [name], Brother in Sports coach   Pertinent History PMH: HTN, paroxysmal a fib    Limitations Standing;Walking    Patient Stated Goals "wants everything working on R side again"    Currently in Pain? No/denies                             [facility] Adult PT Treatment/Exercise - 03/25/20 0001      Transfers   Transfers Sit to Stand;Stand to Sit;Stand Pivot Transfers    Sit to Stand 4: Min guard;4: Min assist;With upper extremity assist;From bed;From chair/3-in-1    Sit to Stand  Details Verbal cues for technique;Verbal cues for safe use of DME/AE;Manual facilitation for weight shifting    Sit to Stand Details (indicate cue type and reason) therapist assisting for proper placement of RLE    Stand to Sit 4: Min guard;4: Min assist;With upper extremity assist;To chair/3-in-1;To bed;Uncontrolled descent    Stand to Sit Details (indicate cue type and reason) Verbal cues for sequencing;Verbal cues for safe use of DME/AE;Manual facilitation for weight shifting;Manual facilitation for placement    Stand to Sit Details cues to attend to RUE for therapist to remove from R hand orthosis before sitting down     Stand Pivot Transfers 4: Min assist    Stand Pivot Transfer Details (indicate cue type and reason) pt blocking R knee when performing      Ambulation/Gait   Ambulation/Gait Yes    Ambulation/Gait Assistance 4: Min assist   2 people for safety (PT and PTA)   Ambulation/Gait Assistance Details w/c follow from pt's gf for safety - however pt did not need a seated rest break this session, used anterior ottobock with t-strap to assist with ankle stability to RLE with all gait and R hand orthosis on RW. Assist needed for proper step placement and blocking of knee in stance at times, also provided verbal and tactile cues for R quad activation. second person for safety/balance with gait. Cues needed for sequencing with gait, step length, weight shifting over RLE in stance and posture. Intermittent verbal cues to slow down at times. occasional assist needed to steer/negotiate walker, esp going around turns and performing turning RW when going to sit back down to mat table at end of bout of gait.     Ambulation Distance (Feet) 115 Feet   x1   Assistive device Rolling walker   R hand orthosis, R AFO   Gait Pattern Step-to pattern;Decreased stride length;Decreased stance time - right;Decreased step length - right;Decreased hip/knee flexion - right;Decreased weight shift to right;Right steppage;Lateral hip instability;Decreased trunk rotation;Narrow base of support;Poor foot clearance - right    Ambulation Surface Level;Indoor      Neuro Re-ed    Neuro Re-ed Details  With Bioness on R quad and R anterior tib: standing at RW with R hand in R hand orthosis, min A from therapist and additional help from PTA for safety with balance, during on times stepping LLE forward and back with additional verbal cues for L quad activation in stance, x10 reps       Modalities   Modalities Electrical Stimulation      Electrical Stimulation   Electrical Stimulation Location R ant tib and R quad    Electrical Stimulation Action  for increased muscle activation and strengthening for increased muscle activation, proprioception, and strengthening     Electrical Stimulation Parameters set pt up on Bioness today - see tablet 1 for parameters     Electrical Stimulation Goals Strength;Neuromuscular facilitation                  PT Education - 03/25/20 1123    Education Details puprose of Bioness    Person(s) Educated Patient   gf   Methods Explanation    Comprehension Verbalized understanding   nodded           PT Short Term Goals - 03/16/20 1136      PT SHORT TERM GOAL #1   Title Pt and pt's family members will be independent with initial HEP in order to build upon functional gains made in therapy.  ALL LTGS DUE 04/13/20    Time 4    Period Weeks    Status New    Target Date 04/13/20      PT SHORT TERM GOAL #2   Title Pt will perform sit > supine with supervision and supine > sit with min guard for bed mobility in order to decr caregiver burden.    Time 4    Period Weeks    Status New      PT SHORT TERM GOAL #3   Title Pt will undergo assessment of AFO for functional transfers and gait.    Time 4    Period Weeks    Status New      PT SHORT TERM GOAL #4   Title Pt will ambulate 83' with RW with mod A x1 in order to improve functional mobility.    Time 4    Period Weeks    Status New      PT SHORT TERM GOAL #5   Title Pt will perform sit <> stand with RW vs. Stedy with min A in order to improve functional BLE strength for transfers.    Time 4    Period Weeks    Status New      Additional Short Term Goals   Additional Short Term Goals Yes      PT SHORT TERM GOAL #6   Title Pt will perform squat pivot transfers from w/c <> mat table with min guard.    Baseline min A for blocking R knee    Time 4    Period Weeks    Status New             PT Long Term Goals - 03/16/20 1139      PT LONG TERM GOAL #1   Title Pt and pt's family members will be independent with final HEP in order to  build upon functional gains made in therapy. ALL LTGS DUE 06/08/20    Time 12    Period Weeks    Status New    Target Date 06/08/20      PT LONG TERM GOAL #2   Title Pt will ambulate 115' with min guard/min A with RW and R AFO in order to improve household mobility.    Time 12    Period Weeks    Status New      PT LONG TERM GOAL #3   Title Pt will perform sit <> stand with RW with min guard in order to improve functional BLE strength for transfers.    Time 12    Period Weeks    Status New      PT LONG TERM GOAL #4   Title Pt will perform squat pivot transfer with mod I vs. stand pivot transfer with RW with min guard in order to decr caregiver burden.    Time 12    Period Weeks    Status New                 Plan - 03/25/20 1139    Clinical Impression Statement Continued to perform gait with R ottobock AFO and T strap for ankle control and R hand orthosis with RW - pt needing assist for swing phase of gait with RLE and proper foot placement. 2nd person used for safety. Pt able to progress gait distance today to 115' without needing a seated rest break. Remainder of session focused on getting pt set up with Bioness to R anterior tib  and quad for strengthening and neuro re-ed. Pt is making progress and remains very motivated, will continue to progress towards LTGs.    Personal Factors and Comorbidities Comorbidity 3+    Comorbidities dense L MCA CVA, paroxysmal A. fib on chronic Xarelto, nonischemic cardiomyopathy and hypertension.    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, works as an Electrical engineer Evolving/Moderate complexity    Rehab Potential Good    PT Frequency 2x / week    PT Duration 12 weeks    PT Treatment/Interventions ADLs/Self Care Home  Management;Aquatic Therapy;DME Instruction;Gait training;Stair training;Functional mobility training;Therapeutic activities;Therapeutic exercise;Electrical Stimulation;Balance training;Neuromuscular re-education;Wheelchair mobility training;Orthotic Fit/Training;Patient/family education;Passive range of motion;Energy conservation    PT Next Visit Plan continue gait training with brace, t-strap and hand orthotic; bioness to LE for strengthening/muscle re-ed, weight shifting/NMR to RLE    Consulted and Agree with Plan of Care Patient;Family member/caregiver    Family Member Consulted brother in Art gallery manager           Patient will benefit from skilled therapeutic intervention in order to improve the following deficits and impairments:  Abnormal gait, Decreased activity tolerance, Decreased balance, Decreased cognition, Decreased mobility, Decreased coordination, Decreased range of motion, Decreased endurance, Decreased strength, Hypomobility, Difficulty walking, Impaired UE functional use, Impaired vision/preception, Postural dysfunction, Impaired sensation  Visit Diagnosis: Muscle weakness (generalized)  Other abnormalities of gait and mobility  Hemiplegia and hemiparesis following cerebral infarction affecting right dominant side (HCC)  Unsteadiness on feet  Other lack of coordination  Difficulty in walking, not elsewhere classified     Problem List Patient Active Problem List   Diagnosis Date Noted  . Neurogenic bladder 03/17/2020  . Sepsis due to pneumonia ([place]) 03/06/2020  . History of stroke with residual deficit 03/06/2020  . Transient hypotension 03/06/2020  . Spastic hemiplegia affecting nondominant side ([place])   . History of hypertension   . Expressive aphasia   . AKI (acute kidney injury) ([place])   . Global aphasia   . Hypoalbuminemia due to protein-calorie malnutrition ([place])   . Dysphagia, post-stroke   . Hyperlipidemia 02/02/2020  . Dysphagia following cerebral  infarction 02/02/2020  . Aspiration pneumonia ([place]) 02/02/2020  . Acute ischemic left MCA stroke ([place]) 02/02/2020  . Acute respiratory failure ([place])   . Acute ischemic stroke [facility]) s/p clot retrieval L MCA & ACA A3 01/27/2020  . Aspiration into airway 01/27/2020  . Chronic anticoagulation 01/27/2020  . Paroxysmal atrial fibrillation ([place]) 06/15/2018  . Essential hypertension 06/15/2018  . NICM (nonischemic cardiomyopathy) ([place]) 06/15/2018  . Visit for monitoring Tikosyn therapy 06/12/2018    Arliss Journey, PT, DPT  03/25/2020, 11:41 AM  [facility] [address] [place], [place], [address] Phone: [phone]   Fax:  [phone]  Name: KEARNEY EVITT MRN: [id number] Date of Birth: 04-07-1956

## 2020-03-26 ENCOUNTER — Encounter: Payer: Self-pay | Admitting: Cardiology

## 2020-03-29 ENCOUNTER — Ambulatory Visit: Payer: BC Managed Care – PPO | Admitting: Physical Therapy

## 2020-03-29 ENCOUNTER — Other Ambulatory Visit: Payer: Self-pay

## 2020-03-29 ENCOUNTER — Encounter: Payer: Self-pay | Admitting: Occupational Therapy

## 2020-03-29 ENCOUNTER — Ambulatory Visit (INDEPENDENT_AMBULATORY_CARE_PROVIDER_SITE_OTHER): Payer: BC Managed Care – PPO | Admitting: Primary Care

## 2020-03-29 ENCOUNTER — Ambulatory Visit: Payer: BC Managed Care – PPO

## 2020-03-29 ENCOUNTER — Ambulatory Visit: Payer: BC Managed Care – PPO | Admitting: Occupational Therapy

## 2020-03-29 DIAGNOSIS — R262 Difficulty in walking, not elsewhere classified: Secondary | ICD-10-CM

## 2020-03-29 DIAGNOSIS — R278 Other lack of coordination: Secondary | ICD-10-CM

## 2020-03-29 DIAGNOSIS — R2681 Unsteadiness on feet: Secondary | ICD-10-CM

## 2020-03-29 DIAGNOSIS — R482 Apraxia: Secondary | ICD-10-CM

## 2020-03-29 DIAGNOSIS — R4701 Aphasia: Secondary | ICD-10-CM

## 2020-03-29 DIAGNOSIS — I69351 Hemiplegia and hemiparesis following cerebral infarction affecting right dominant side: Secondary | ICD-10-CM | POA: Diagnosis not present

## 2020-03-29 DIAGNOSIS — M6281 Muscle weakness (generalized): Secondary | ICD-10-CM

## 2020-03-29 DIAGNOSIS — R2689 Other abnormalities of gait and mobility: Secondary | ICD-10-CM

## 2020-03-29 NOTE — Therapy (Signed)
[facility] [address] [place], [place], [address] Phone: [phone]   Fax:  [phone]  Occupational Therapy Treatment  Patient Details  Name: Dustin Schneider MRN: [id number] Date of Birth: 1955-11-30 No data recorded  Encounter Date: 03/29/2020   OT End of Session - 03/29/20 0923    Visit Number 4    Number of Visits 25    Date for OT Re-Evaluation 06/23/20    Authorization Type BC/BS - No auth required, covered 100%    OT Start Time [phone]    OT Stop Time 0930    OT Time Calculation (min) 39 min    Activity Tolerance Patient tolerated treatment well    Behavior During Therapy [facility] for tasks assessed/performed           Past Medical History:  Diagnosis Date  . Hypertension   . NICM (nonischemic cardiomyopathy) ([place]) 06/15/2018   04/23/18: EF 45%, 09/15/18: NOrmal LVEF  . Paroxysmal atrial fibrillation ([place]) 06/15/2018  . Stroke ([place])   . Visit for monitoring Tikosyn therapy 06/12/2018    Past Surgical History:  Procedure Laterality Date  . BUBBLE STUDY  03/11/2020   Procedure: BUBBLE STUDY;  Surgeon: [name], MD;  Location: [place];  Service: Cardiovascular;;  . CARDIOVERSION N/A 05/13/2018   Procedure: CARDIOVERSION;  Surgeon: [name], MD;  Location: [facility] ENDOSCOPY;  Service: Cardiovascular;  Laterality: N/A;  . CARDIOVERSION N/A 06/13/2018   Procedure: CARDIOVERSION;  Surgeon: [name], MD;  Location: [facility] ENDOSCOPY;  Service: Cardiovascular;  Laterality: N/A;  . CARDIOVERSION N/A 06/23/2018   Procedure: CARDIOVERSION;  Surgeon: [name], MD;  Location: [facility] ENDOSCOPY;  Service: Cardiovascular;  Laterality: N/A;  . CARDIOVERSION N/A 03/11/2020   Procedure: CARDIOVERSION;  Surgeon: [name], MD;  Location: [place];  Service: Cardiovascular;  Laterality: N/A;  . excision of actinic keratosis    . EYE SURGERY     eye lift  . IR CT HEAD LTD  01/27/2020  . IR PERCUTANEOUS ART  THROMBECTOMY/INFUSION INTRACRANIAL INC DIAG ANGIO  01/27/2020      . IR PERCUTANEOUS ART THROMBECTOMY/INFUSION INTRACRANIAL INC DIAG ANGIO  01/27/2020  . RADIOLOGY WITH ANESTHESIA N/A 01/27/2020   Procedure: IR WITH ANESTHESIA;  Surgeon: Radiologist, Medication, MD;  Location: [place];  Service: Radiology;  Laterality: N/A;  . TEE WITHOUT CARDIOVERSION N/A 03/11/2020   Procedure: TRANSESOPHAGEAL ECHOCARDIOGRAM (TEE);  Surgeon: [name], MD;  Location: [place];  Service: Cardiovascular;  Laterality: N/A;  . TONSILLECTOMY    . WISDOM TOOTH EXTRACTION      There were no vitals filed for this visit.   Subjective Assessment - 03/29/20 0919    Subjective  No, when asked if he has pain    Patient is accompanied by: Family member   BROTHER   Pertinent History Lt MCA CVA 01/27/20, readmitted to hospital with HAP and fever on 03/06/20 and released 03/11/20. PMH: paroxsymal A-fib, HTN    Limitations fall    Currently in Pain? No/denies                    Treatment: supine closed chain chest press and shoulder flexion with PVC pipe frame 10 reps each, min-mod facillitation for shoulder positioning. AA/ROM elbow flexion and extension, and finger flexion/ extension, pt demonstrates new improved finger A/ROM today. Seated weightbearing through RUE with body on arm movements, followed by AA/ROM shoulder flexion,abduction with UE ranger, min facilitation. Low range functional grasp/ release of dowel pegs mod  facilitation. Pt transferred to and from mat with min A.                OT Short Term Goals - 03/23/20 1342      OT SHORT TERM GOAL #1   Title Independent with initial HEP    Time 4    Period Weeks    Status New      OT SHORT TERM GOAL #2   Title Pt to perform low to mid level reaching of light objects RUE in prep for feeding self and bathing    Time 4    Period Weeks    Status New      OT SHORT TERM GOAL #3   Title Pt to feed self 50% of the time with Rt dominant  hand and A/E prn    Time 4    Period Weeks    Status New      OT SHORT TERM GOAL #4   Title Pt to perform UE dressing of pullover shirt mod I level    Time 4    Period Weeks    Status New      OT SHORT TERM GOAL #5   Title Pt to perform LE dressing and all bathing with no more than mod assist using A/E and strategies prn    Time 4    Period Weeks    Status New      Additional Short Term Goals   Additional Short Term Goals Yes      OT SHORT TERM GOAL #6   Title Pt to perform toileting and toilet transfers with no more than min assist overall    Time 4    Period Weeks    Status New      OT SHORT TERM GOAL #7   Title Improve functional use of RUE as evidenced by performing 8 blocks on Box & Blocks test    Baseline 0    Time 4    Period Weeks    Status New             OT Long Term Goals - 03/23/20 1346      OT LONG TERM GOAL #1   Title Pt to be independent with updated HEP    Time 12    Period Weeks    Status New      OT LONG TERM GOAL #2   Title Pt to perform all BADLS at mod I level    Time 12    Period Weeks    Status New      OT LONG TERM GOAL #3   Title Pt to feed self 75% or greater with Rt dominant hand and grooming 50% or more with Rt hand    Time 12    Period Weeks    Status New      OT LONG TERM GOAL #4   Title Pt to retrieve light weight objects from high shelf RUE consistently    Time 12    Period Weeks    Status New      OT LONG TERM GOAL #5   Title Pt to demo 25 lbs grip strength or more Rt dominant hand for gripping, opening jars/containers    Time 12    Period Weeks    Status New      Long Term Additional Goals   Additional Long Term Goals Yes      OT LONG TERM GOAL #6   Title Pt to  perform simple meal prep and light cleaning tasks mod I level    Time 12    Period Weeks    Status New      OT LONG TERM GOAL #7   Title Improve functional use RUE as evidenced by performing 25 blocks on Box & Blocks and performing 9 hole peg test  in 90 sec or under    Time 12    Period Weeks    Status New      OT LONG TERM GOAL #8   Title Pt to returned to modified art/sculpting tasks with task modifications and A/E prn    Time 12    Period Weeks    Status New                 Plan - 03/29/20 6767    Clinical Impression Statement Pt is progressing towards goals. He demonstrates improved hand function today.    OT Occupational Profile and History Detailed Assessment- Review of Records and additional review of physical, cognitive, psychosocial history related to current functional performance    Occupational performance deficits (Please refer to evaluation for details): ADL's;IADL's;Work;Leisure;Social Participation    Body Structure / Function / Physical Skills ADL;ROM;Dexterity;Balance;IADL;Body mechanics;Improper spinal/pelvic alignment;Sensation;Mobility;Flexibility;Strength;Coordination;FMC;Tone;UE functional use;Decreased knowledge of use of DME;Proprioception    Rehab Potential Good    Clinical Decision Making Several treatment options, min-mod task modification necessary    Comorbidities Affecting Occupational Performance: May have comorbidities impacting occupational performance    Modification or Assistance to Complete Evaluation  Min-Moderate modification of tasks or assist with assess necessary to complete eval    OT Frequency 2x / week    OT Duration 12 weeks   pluse eval   OT Treatment/Interventions Self-care/ADL training;Therapeutic exercise;Functional Mobility Training;Neuromuscular education;Manual Therapy;Splinting;Aquatic Therapy;Manual lymph drainage;Therapeutic activities;Coping strategies training;DME and/or AE instruction;Electrical Stimulation;Fluidtherapy;Passive range of motion;Patient/family education    Plan functional use RUE, NMR    Consulted and Agree with Plan of Care Patient           Patient will benefit from skilled therapeutic intervention in order to improve the following deficits and  impairments:   Body Structure / Function / Physical Skills: ADL, ROM, Dexterity, Balance, IADL, Body mechanics, Improper spinal/pelvic alignment, Sensation, Mobility, Flexibility, Strength, Coordination, FMC, Tone, UE functional use, Decreased knowledge of use of DME, Proprioception       Visit Diagnosis: Muscle weakness (generalized)  Hemiplegia and hemiparesis following cerebral infarction affecting right dominant side (HCC)  Other lack of coordination    Problem List Patient Active Problem List   Diagnosis Date Noted  . Neurogenic bladder 03/17/2020  . Sepsis due to pneumonia ([place]) 03/06/2020  . History of stroke with residual deficit 03/06/2020  . Transient hypotension 03/06/2020  . Spastic hemiplegia affecting nondominant side ([place])   . History of hypertension   . Expressive aphasia   . AKI (acute kidney injury) ([place])   . Global aphasia   . Hypoalbuminemia due to protein-calorie malnutrition ([place])   . Dysphagia, post-stroke   . Hyperlipidemia 02/02/2020  . Dysphagia following cerebral infarction 02/02/2020  . Aspiration pneumonia ([place]) 02/02/2020  . Acute ischemic left MCA stroke ([place]) 02/02/2020  . Acute respiratory failure ([place])   . Acute ischemic stroke [facility]) s/p clot retrieval L MCA & ACA A3 01/27/2020  . Aspiration into airway 01/27/2020  . Chronic anticoagulation 01/27/2020  . Paroxysmal atrial fibrillation ([place]) 06/15/2018  . Essential hypertension 06/15/2018  . NICM (nonischemic cardiomyopathy) ([place]) 06/15/2018  . Visit for monitoring Tikosyn therapy  06/12/2018    Roselynne Lortz 03/29/2020, 9:26 AM  [facility] [address] [place], [place], [address] Phone: [phone]   Fax:  [phone]  Name: Dustin Schneider MRN: [id number] Date of Birth: Dec 24, 1955

## 2020-03-29 NOTE — Therapy (Signed)
[facility] [address] [place], [place], [address] Phone: [phone]   Fax:  [phone]  Physical Therapy Treatment  Patient Details  Name: Dustin Schneider MRN: [id number] Date of Birth: August 15, 1955 Referring Provider (PT): Lauraine Rinne, PA-C (will be followed by Dr. Posey Pronto)   Encounter Date: 03/29/2020   PT End of Session - 03/29/20 1638    Visit Number 5    Number of Visits 25    Date for PT Re-Evaluation 06/14/20    Authorization Type BCBS    PT Start Time 1445    PT Stop Time 1528    PT Time Calculation (min) 43 min    Equipment Utilized During Treatment Gait belt    Activity Tolerance Patient tolerated treatment well    Behavior During Therapy [facility] for tasks assessed/performed           Past Medical History:  Diagnosis Date  . Hypertension   . NICM (nonischemic cardiomyopathy) ([place]) 06/15/2018   04/23/18: EF 45%, 09/15/18: NOrmal LVEF  . Paroxysmal atrial fibrillation ([place]) 06/15/2018  . Stroke ([place])   . Visit for monitoring Tikosyn therapy 06/12/2018    Past Surgical History:  Procedure Laterality Date  . BUBBLE STUDY  03/11/2020   Procedure: BUBBLE STUDY;  Surgeon: [name], MD;  Location: [place];  Service: Cardiovascular;;  . CARDIOVERSION N/A 05/13/2018   Procedure: CARDIOVERSION;  Surgeon: [name], MD;  Location: [facility] ENDOSCOPY;  Service: Cardiovascular;  Laterality: N/A;  . CARDIOVERSION N/A 06/13/2018   Procedure: CARDIOVERSION;  Surgeon: [name], MD;  Location: [facility] ENDOSCOPY;  Service: Cardiovascular;  Laterality: N/A;  . CARDIOVERSION N/A 06/23/2018   Procedure: CARDIOVERSION;  Surgeon: [name], MD;  Location: [facility] ENDOSCOPY;  Service: Cardiovascular;  Laterality: N/A;  . CARDIOVERSION N/A 03/11/2020   Procedure: CARDIOVERSION;  Surgeon: [name], MD;  Location: [place];  Service: Cardiovascular;  Laterality: N/A;  . excision of actinic keratosis    . EYE SURGERY      eye lift  . IR CT HEAD LTD  01/27/2020  . IR PERCUTANEOUS ART THROMBECTOMY/INFUSION INTRACRANIAL INC DIAG ANGIO  01/27/2020      . IR PERCUTANEOUS ART THROMBECTOMY/INFUSION INTRACRANIAL INC DIAG ANGIO  01/27/2020  . RADIOLOGY WITH ANESTHESIA N/A 01/27/2020   Procedure: IR WITH ANESTHESIA;  Surgeon: Radiologist, Medication, MD;  Location: [place];  Service: Radiology;  Laterality: N/A;  . TEE WITHOUT CARDIOVERSION N/A 03/11/2020   Procedure: TRANSESOPHAGEAL ECHOCARDIOGRAM (TEE);  Surgeon: [name], MD;  Location: [facility];  Service: Cardiovascular;  Laterality: N/A;  . TONSILLECTOMY    . WISDOM TOOTH EXTRACTION      There were no vitals filed for this visit.   Subjective Assessment - 03/29/20 1448    Subjective No changes since last time. Things are going well. Rudene Anda is tomorrow.    Patient is accompained by: Family member   [name], Brother in Sports coach   Pertinent History PMH: HTN, paroxysmal a fib    Limitations Standing;Walking    Patient Stated Goals "wants everything working on R side again"    Currently in Pain? No/denies                             [facility] Adult PT Treatment/Exercise - 03/29/20 1510      Transfers   Transfers Sit to Stand;Stand to Sit;Stand Pivot Transfers    Sit to Stand 4: Min guard;4: Min assist;With upper extremity assist;From bed;From  chair/3-in-1    Sit to Stand Details Verbal cues for technique;Verbal cues for safe use of DME/AE;Manual facilitation for weight shifting    Sit to Stand Details (indicate cue type and reason) therapist placing RLE in proper alignment first prior to standing, cues to push off mat table with LUE, min A initially in standing for weight shift towards RLE    Stand to Sit 4: Min guard;4: Min assist;With upper extremity assist;To chair/3-in-1;To bed;Uncontrolled descent    Stand to Sit Details (indicate cue type and reason) Verbal cues for sequencing;Verbal cues for safe use of DME/AE;Manual facilitation for weight  shifting;Manual facilitation for placement    Stand to Sit Details cues to remove R hand from R hand orthosis prior to sitting    Stand Pivot Transfers 4: Min assist    Stand Pivot Transfer Details (indicate cue type and reason) PT blocking R knee and making sure that R ankle stays in proper alignment      Ambulation/Gait   Ambulation/Gait Yes    Ambulation/Gait Assistance 4: Min assist    Ambulation/Gait Assistance Details  used anterior ottobock with t-strap to assist with ankle stability to RLE with all gait and R hand orthosis on RW. Assist needed for proper step placement and blocking of knee in stance at times, also provided verbal and tactile cues for R quad activation. second person for safety/balance with gait. Cues needed for weight shifting over RLE in stance and posture. Intermittent verbal cues to slow down at times for proper R foot placement. Utilized a blue shoe cover over RLE during 2nd lap of gait with pt demonstrating improved foot clearance during swing phase     Ambulation Distance (Feet) 115 Feet   x2   Assistive device Rolling walker    Gait Pattern Step-to pattern;Decreased stride length;Decreased stance time - right;Decreased step length - right;Decreased hip/knee flexion - right;Decreased weight shift to right;Right steppage;Lateral hip instability;Decreased trunk rotation;Narrow base of support;Poor foot clearance - right;Step-through pattern    Ambulation Surface Level;Indoor      Neuro Re-ed    Neuro Re-ed Details  standing with RW with R hand orthosis, therapist at pt's R knee providing min A and tech providing stand by for safety/CGA for balance: 2 x 10 reps forward steps with LLE with verbal and tactile cues for R quad activation prior to stepping, 2 x 10 reps step taps with LLE to 2" step - verbal and tactile cues for R quad activation, with use of mirror for visual feedback x10 reps lateral weight shifting towards R and L, therapist providing assist at hips for proper  alignment as R hip tendency to rotate posteriorly when shifting weight                     PT Short Term Goals - 03/16/20 1136      PT SHORT TERM GOAL #1   Title Pt and pt's family members will be independent with initial HEP in order to build upon functional gains made in therapy. ALL LTGS DUE 04/13/20    Time 4    Period Weeks    Status New    Target Date 04/13/20      PT SHORT TERM GOAL #2   Title Pt will perform sit > supine with supervision and supine > sit with min guard for bed mobility in order to decr caregiver burden.    Time 4    Period Weeks    Status New  PT SHORT TERM GOAL #3   Title Pt will undergo assessment of AFO for functional transfers and gait.    Time 4    Period Weeks    Status New      PT SHORT TERM GOAL #4   Title Pt will ambulate 4' with RW with mod A x1 in order to improve functional mobility.    Time 4    Period Weeks    Status New      PT SHORT TERM GOAL #5   Title Pt will perform sit <> stand with RW vs. Stedy with min A in order to improve functional BLE strength for transfers.    Time 4    Period Weeks    Status New      Additional Short Term Goals   Additional Short Term Goals Yes      PT SHORT TERM GOAL #6   Title Pt will perform squat pivot transfers from w/c <> mat table with min guard.    Baseline min A for blocking R knee    Time 4    Period Weeks    Status New             PT Long Term Goals - 03/16/20 1139      PT LONG TERM GOAL #1   Title Pt and pt's family members will be independent with final HEP in order to build upon functional gains made in therapy. ALL LTGS DUE 06/08/20    Time 12    Period Weeks    Status New    Target Date 06/08/20      PT LONG TERM GOAL #2   Title Pt will ambulate 115' with min guard/min A with RW and R AFO in order to improve household mobility.    Time 12    Period Weeks    Status New      PT LONG TERM GOAL #3   Title Pt will perform sit <> stand with RW with min guard  in order to improve functional BLE strength for transfers.    Time 12    Period Weeks    Status New      PT LONG TERM GOAL #4   Title Pt will perform squat pivot transfer with mod I vs. stand pivot transfer with RW with min guard in order to decr caregiver burden.    Time 12    Period Weeks    Status New                 Plan - 03/29/20 1641    Clinical Impression Statement Pt with improvement in R quad activation today with stance phase of gait, continues to need assist from therapist for proper RLE foot placement. During 2nd lap of gait utilized a blue shoe cover with pt demonstrating improved foot clearance during swing phase of gait. Remainder of session focused on standing balance and weight shifting towards RLE with use of mirror for visual feedback. Pt is making excellent progress and remains very motivated, will continue to progress towards LTGs.    Personal Factors and Comorbidities Comorbidity 3+    Comorbidities dense L MCA CVA, paroxysmal A. fib on chronic Xarelto, nonischemic cardiomyopathy and hypertension.    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, works as an Electrical engineer Evolving/Moderate complexity    Rehab Potential Good    PT Frequency 2x /  week    PT Duration 12 weeks    PT Treatment/Interventions ADLs/Self Care Home Management;Aquatic Therapy;DME Instruction;Gait training;Stair training;Functional mobility training;Therapeutic activities;Therapeutic exercise;Electrical Stimulation;Balance training;Neuromuscular re-education;Wheelchair mobility training;Orthotic Fit/Training;Patient/family education;Passive range of motion;Energy conservation    PT Next Visit Plan continue gait training with brace, t-strap and hand orthotic; bioness to LE for  strengthening/muscle re-ed, weight shifting/NMR to RLE    Consulted and Agree with Plan of Care Patient;Family member/caregiver    Family Member Consulted brother in Art gallery manager           Patient will benefit from skilled therapeutic intervention in order to improve the following deficits and impairments:  Abnormal gait, Decreased activity tolerance, Decreased balance, Decreased cognition, Decreased mobility, Decreased coordination, Decreased range of motion, Decreased endurance, Decreased strength, Hypomobility, Difficulty walking, Impaired UE functional use, Impaired vision/preception, Postural dysfunction, Impaired sensation  Visit Diagnosis: Other abnormalities of gait and mobility  Muscle weakness (generalized)  Other lack of coordination  Unsteadiness on feet  Difficulty in walking, not elsewhere classified     Problem List Patient Active Problem List   Diagnosis Date Noted  . Neurogenic bladder 03/17/2020  . Sepsis due to pneumonia ([place]) 03/06/2020  . History of stroke with residual deficit 03/06/2020  . Transient hypotension 03/06/2020  . Spastic hemiplegia affecting nondominant side ([place])   . History of hypertension   . Expressive aphasia   . AKI (acute kidney injury) ([place])   . Global aphasia   . Hypoalbuminemia due to protein-calorie malnutrition ([place])   . Dysphagia, post-stroke   . Hyperlipidemia 02/02/2020  . Dysphagia following cerebral infarction 02/02/2020  . Aspiration pneumonia ([place]) 02/02/2020  . Acute ischemic left MCA stroke ([place]) 02/02/2020  . Acute respiratory failure ([place])   . Acute ischemic stroke [facility]) s/p clot retrieval L MCA & ACA A3 01/27/2020  . Aspiration into airway 01/27/2020  . Chronic anticoagulation 01/27/2020  . Paroxysmal atrial fibrillation ([place]) 06/15/2018  . Essential hypertension 06/15/2018  . NICM (nonischemic cardiomyopathy) ([place]) 06/15/2018  . Visit for monitoring Tikosyn therapy 06/12/2018    Arliss Journey, PT, DPT    03/29/2020, 4:48 PM  [facility] [address] [place], [place], [address] Phone: [phone]   Fax:  [phone]  Name: JERMEL ARTLEY MRN: [id number] Date of Birth: 10-19-1955

## 2020-03-29 NOTE — Therapy (Signed)
[facility] [address] [place], [place], [address] Phone: [phone]   Fax:  [phone]  Speech Language Pathology Treatment  Patient Details  Name: Dustin Schneider MRN: [id number] Date of Birth: October 02, 1955 Referring Provider (SLP): Dr. Delice Lesch   Encounter Date: 03/29/2020   End of Session - 03/29/20 1624    Visit Number 2    Number of Visits 25    Date for SLP Re-Evaluation 06/15/20    SLP Start Time 8338    SLP Stop Time  1400    SLP Time Calculation (min) 42 min    Activity Tolerance Patient tolerated treatment well           Past Medical History:  Diagnosis Date  . Hypertension   . NICM (nonischemic cardiomyopathy) ([place]) 06/15/2018   04/23/18: EF 45%, 09/15/18: NOrmal LVEF  . Paroxysmal atrial fibrillation ([place]) 06/15/2018  . Stroke ([place])   . Visit for monitoring Tikosyn therapy 06/12/2018    Past Surgical History:  Procedure Laterality Date  . BUBBLE STUDY  03/11/2020   Procedure: BUBBLE STUDY;  Surgeon: [name], MD;  Location: [facility];  Service: Cardiovascular;;  . CARDIOVERSION N/A 05/13/2018   Procedure: CARDIOVERSION;  Surgeon: [name], MD;  Location: [facility] ENDOSCOPY;  Service: Cardiovascular;  Laterality: N/A;  . CARDIOVERSION N/A 06/13/2018   Procedure: CARDIOVERSION;  Surgeon: [name], MD;  Location: [facility] ENDOSCOPY;  Service: Cardiovascular;  Laterality: N/A;  . CARDIOVERSION N/A 06/23/2018   Procedure: CARDIOVERSION;  Surgeon: [name], MD;  Location: [facility] ENDOSCOPY;  Service: Cardiovascular;  Laterality: N/A;  . CARDIOVERSION N/A 03/11/2020   Procedure: CARDIOVERSION;  Surgeon: [name], MD;  Location: [facility];  Service: Cardiovascular;  Laterality: N/A;  . excision of actinic keratosis    . EYE SURGERY     eye lift  . IR CT HEAD LTD  01/27/2020  . IR PERCUTANEOUS ART THROMBECTOMY/INFUSION INTRACRANIAL INC DIAG ANGIO  01/27/2020      . IR PERCUTANEOUS ART  THROMBECTOMY/INFUSION INTRACRANIAL INC DIAG ANGIO  01/27/2020  . RADIOLOGY WITH ANESTHESIA N/A 01/27/2020   Procedure: IR WITH ANESTHESIA;  Surgeon: Radiologist, Medication, MD;  Location: [facility];  Service: Radiology;  Laterality: N/A;  . TEE WITHOUT CARDIOVERSION N/A 03/11/2020   Procedure: TRANSESOPHAGEAL ECHOCARDIOGRAM (TEE);  Surgeon: [name], MD;  Location: [facility];  Service: Cardiovascular;  Laterality: N/A;  . TONSILLECTOMY    . WISDOM TOOTH EXTRACTION      There were no vitals filed for this visit.   Subjective Assessment - 03/29/20 1325    Subjective Pt looked at SLP nametag as he entered the therapy hallway.    Patient is accompained by: Family member   bro-in-law [name]   Currently in Pain? No/denies                 ADULT SLP TREATMENT - 03/29/20 1327      General Information   Behavior/Cognition Alert;Cooperative;Pleasant mood      Treatment Provided   Treatment provided Cognitive-Linquistic      Cognitive-Linquistic Treatment   Treatment focused on Aphasia;Apraxia    Skilled Treatment Bro in law states pt has been working with an app for aphasia at home, usually with assistance. Simple conversation prior to structured tasks - pt demonstrated understanding with SLP slowed rate. Told SLP who accompanied him with written cues ("WIL") and verbal cues ("wil-"). With months of the year and days of the week, pt req'd written cues and initial phoneme cues  for success; multisyllabic stimuli were less accurate/more difficult than monosyllabic words. Opposites were articulated with the paired word with 80% success functionally, using written cues. Without written cues pt maintained 80% success functionally (apraxic errors). SLP explained cloze phrases/opposites homework to BIL about how to complete at home and how to cue pt if needed (phonemic).       Assessment / Recommendations / Plan   Plan Continue with current plan of care      Progression Toward Goals    Progression toward goals Progressing toward goals            SLP Education - 03/29/20 1623    Education Details how to cue pt at home    Person(s) Educated Patient;Caregiver(s)    Methods Explanation;Demonstration;Verbal cues    Comprehension Verbalized understanding;Need further instruction;Verbal cues required            SLP Short Term Goals - 03/29/20 1626      SLP SHORT TERM GOAL #1   Title Pt will name family, friends and art items with occasional min A 8/10 with approximations allowed.    Time 6    Period Weeks    Status On-going      SLP SHORT TERM GOAL #2   Title Pt will name 7 items in personally relevant categories with occasional min A over 2 sessions    Time 6    Period Weeks    Status On-going      SLP SHORT TERM GOAL #3   Title Pt will perform 4 automatic speech tasks with rare min A.    Time 6    Period Weeks    Status On-going      SLP SHORT TERM GOAL #4   Title Pt will write personal information (name/address/phone), 5 family/friend/pet names and 7  professional words with occasional min A    Time 6    Period Weeks    Status On-going      SLP SHORT TERM GOAL #5   Title Pt will use mulitmodal communication (gesture, draw, write 1st letter etc) to augment verbal expression to meet needs at home with rare min A from family.    Time 6    Period Weeks    Status On-going            SLP Long Term Goals - 03/29/20 1626      SLP LONG TERM GOAL #1   Title Pt will verbalize 3 words to explain or describe his artwork and hobbies with occasional min A over 3 sessions, allowing for intellgible approximations    Time 12    Period Weeks    Status On-going      SLP LONG TERM GOAL #2   Title Pt and family will utilize multimodal compensations for aphasia and verbal apraxia to participate in 3 turns each of conversation with occasional min A over 2 sessions    Time 12    Period Weeks    Status On-going      SLP LONG TERM GOAL #3   Title Pt will write 3  word phrases with less than 2 errors with rare min A over 3 sessions    Time 12    Period Weeks    Status On-going      SLP LONG TERM GOAL #4   Title Reading assessment if indicated    Time 7    Period Weeks    Status On-going  Plan - 03/29/20 1624    Clinical Impression Statement Pt presents today with cont'd mod Broca's type aphasia and mod-severe verbal apraxia. See "skilled intervention" for more details. I recommend skilled ST to maximize communication for QOL, safety to reduce family burden.    Speech Therapy Frequency 2x / week    Duration --   12 weeks or 25 visits   Treatment/Interventions Environmental controls;Cueing hierarchy;SLP instruction and feedback;Compensatory strategies;Functional tasks;Cognitive reorganization;Compensatory techniques;Internal/external aids;Multimodal communcation approach;Patient/family education;Language facilitation    Potential to Achieve Goals Good    Potential Considerations Severity of impairments           Patient will benefit from skilled therapeutic intervention in order to improve the following deficits and impairments:   Aphasia  Verbal apraxia    Problem List Patient Active Problem List   Diagnosis Date Noted  . Neurogenic bladder 03/17/2020  . Sepsis due to pneumonia ([place]) 03/06/2020  . History of stroke with residual deficit 03/06/2020  . Transient hypotension 03/06/2020  . Spastic hemiplegia affecting nondominant side ([facility])   . History of hypertension   . Expressive aphasia   . AKI (acute kidney injury) ([place])   . Global aphasia   . Hypoalbuminemia due to protein-calorie malnutrition ([place])   . Dysphagia, post-stroke   . Hyperlipidemia 02/02/2020  . Dysphagia following cerebral infarction 02/02/2020  . Aspiration pneumonia ([place]) 02/02/2020  . Acute ischemic left MCA stroke ([place]) 02/02/2020  . Acute respiratory failure ([place])   . Acute ischemic stroke [facility]) s/p clot retrieval L MCA & ACA A3 01/27/2020  .  Aspiration into airway 01/27/2020  . Chronic anticoagulation 01/27/2020  . Paroxysmal atrial fibrillation ([place]) 06/15/2018  . Essential hypertension 06/15/2018  . NICM (nonischemic cardiomyopathy) ([place]) 06/15/2018  . Visit for monitoring Tikosyn therapy 06/12/2018    [facility] ,[place], [place]  03/29/2020, 4:27 PM  [facility] [address] [place], [place], [address] Phone: [phone]   Fax:  [phone]   Name: TILMON WISEHART MRN: [id number] Date of Birth: July 10, 1956

## 2020-03-30 ENCOUNTER — Encounter: Payer: Self-pay | Admitting: Adult Health

## 2020-03-30 ENCOUNTER — Ambulatory Visit (INDEPENDENT_AMBULATORY_CARE_PROVIDER_SITE_OTHER): Payer: BC Managed Care – PPO | Admitting: Adult Health

## 2020-03-30 VITALS — BP 124/82 | HR 62

## 2020-03-30 DIAGNOSIS — I10 Essential (primary) hypertension: Secondary | ICD-10-CM | POA: Diagnosis not present

## 2020-03-30 DIAGNOSIS — E782 Mixed hyperlipidemia: Secondary | ICD-10-CM | POA: Diagnosis not present

## 2020-03-30 DIAGNOSIS — I639 Cerebral infarction, unspecified: Secondary | ICD-10-CM

## 2020-03-30 DIAGNOSIS — I480 Paroxysmal atrial fibrillation: Secondary | ICD-10-CM

## 2020-03-30 NOTE — Progress Notes (Addendum)
[facility] [address] [place] [address] [phone]       HOSPITAL FOLLOW UP NOTE  Mr. LELON IKARD Date of Birth:  Nov 21, 1955 Medical Record Number:  [id number]   Reason for Referral:  hospital stroke follow up    SUBJECTIVE:   CHIEF COMPLAINT:  Chief Complaint  Patient presents with  . Hospitalization Follow-up    HFU after stroke   . room 5    with brother in law    HPI:   Mr.Kyrollos E Barnhillis a 64 y.o.malewith history of paroxysmal A. fib on chronic Xarelto, nonischemic cardiomyopathy and hypertensionwho presented on 01/27/2020 with L forced gaze and R hemiplegia with nausea and vomiting.Stroke work up revealed L MCA and ACA scattered infarcts s/p TICI3 revascularization of L MCA and A3, infarct embolic secondary to known AF on Xarelto. Switched Xarelto to [employer]. Hx of HTN stable with long term BP goal normotensive range. Hx of HLD with LDL 33 and recommended continuation of Crestor 10mg  daily. Other stroke risk factors include NICM w/ EF 45% PTA (currently EF 55-60%) on [place] and advanced age. No prior stroke history. Evaluated by therapy and discharged to CIR for ongoing therapy needs with residual dysphagia, apraxia with limited verbal output, and right hemiparesis.   Stroke: L MCA and ACA scattered infarcts s/p TICI3 revascularization of L MCA &A3, infarct embolic secondary to known AF on Xarelto  Code Stroke CT head Hyperdense L M2. ASPECTS 10.   CTA head & neckL MCA bifurcation occlusion w/ little distal flow. Minimal collateral flow.  Cerebral angio distal L M1 occlusion, distal L A3 occlusion. TICI 3 revascularization MCA and ACA   Post IR CT no hemorrhage  MRI L basal ganglia infarct w/ mild edema. L cingulate and L superior frontal lobe infarcts w/ mild cortical edema. Few punctate infarcts R cingulate. Petechial hemorrhage at caudate. Small R cavernous malformation.   MRA L MCA and ACA open.  2D Echo -  EF 55 - 60%. No cardiac source of emboli identified.   LDL33  HgbA1c - 5.6  Xarelto daily prior to admission, now on aspirin 325 po. switch to eliquis at d/c  Therapy recommendations: CIR  Disposition: CIR   Today, 03/30/2020, Mr. Cuny is being seen for hospital follow-up accompanied by his brother in law  Residual deficits moderate Broca's type aphasia, moderate to severe verbal apraxia, and right hemiparesis Continues to work with outpatient PT/OT/SLP with ongoing improvement Ambulating with therapy with RW  Currently living with his brother-in-law where he receives 24-hour support by brother-in-law and family members Denies new or worsening stroke/TIA symptoms  Continues on Eliquis without bleeding or bruising Continues on Crestor without myalgias Blood pressure 124/82  No concerns at this time     ROS:   14 system review of systems performed and negative with exception of weakness and speech difficulty  PMH:  Past Medical History:  Diagnosis Date  . Hypertension   . NICM (nonischemic cardiomyopathy) ([place]) 06/15/2018   04/23/18: EF 45%, 09/15/18: NOrmal LVEF  . Paroxysmal atrial fibrillation ([place]) 06/15/2018  . Stroke ([place])   . Visit for monitoring Tikosyn therapy 06/12/2018    PSH:  Past Surgical History:  Procedure Laterality Date  . BUBBLE STUDY  03/11/2020   Procedure: BUBBLE STUDY;  Surgeon: [name], MD;  Location: [facility];  Service: Cardiovascular;;  . CARDIOVERSION N/A 05/13/2018   Procedure: CARDIOVERSION;  Surgeon: [name], MD;  Location: [facility];  Service: Cardiovascular;  Laterality: N/A;  .  CARDIOVERSION N/A 06/13/2018   Procedure: CARDIOVERSION;  Surgeon: [name], MD;  Location: [facility] ENDOSCOPY;  Service: Cardiovascular;  Laterality: N/A;  . CARDIOVERSION N/A 06/23/2018   Procedure: CARDIOVERSION;  Surgeon: [name], MD;  Location: [facility] ENDOSCOPY;  Service: Cardiovascular;  Laterality: N/A;  . CARDIOVERSION N/A 03/11/2020    Procedure: CARDIOVERSION;  Surgeon: [name], MD;  Location: [facility];  Service: Cardiovascular;  Laterality: N/A;  . excision of actinic keratosis    . EYE SURGERY     eye lift  . IR CT HEAD LTD  01/27/2020  . IR PERCUTANEOUS ART THROMBECTOMY/INFUSION INTRACRANIAL INC DIAG ANGIO  01/27/2020      . IR PERCUTANEOUS ART THROMBECTOMY/INFUSION INTRACRANIAL INC DIAG ANGIO  01/27/2020  . RADIOLOGY WITH ANESTHESIA N/A 01/27/2020   Procedure: IR WITH ANESTHESIA;  Surgeon: Radiologist, Medication, MD;  Location: [facility];  Service: Radiology;  Laterality: N/A;  . TEE WITHOUT CARDIOVERSION N/A 03/11/2020   Procedure: TRANSESOPHAGEAL ECHOCARDIOGRAM (TEE);  Surgeon: [name], MD;  Location: [facility];  Service: Cardiovascular;  Laterality: N/A;  . TONSILLECTOMY    . WISDOM TOOTH EXTRACTION      Social History:  Social History   Socioeconomic History  . Marital status: Divorced    Spouse name: Not on file  . Number of children: 1  . Years of education: Not on file  . Highest education level: Not on file  Occupational History  . Not on file  Tobacco Use  . Smoking status: Never Smoker  . Smokeless tobacco: Never Used  Vaping Use  . Vaping Use: Never used  Substance and Sexual Activity  . Alcohol use: Not Currently  . Drug use: No  . Sexual activity: Not on file    Comment: DIVORCE  Other Topics Concern  . 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 Difficulty of Paying Living Expenses:   Food Insecurity:   . Worried About Charity fundraiser in the Last Year:   . Arboriculturist in the Last Year:   Transportation Needs:   . Film/video editor (Medical):   Marland Kitchen Lack of Transportation (Non-Medical):   Physical Activity:   . Days of Exercise per Week:   . Minutes of Exercise per Session:   Stress:   . Feeling of Stress :   Social Connections:   .  Frequency of Communication with Friends and Family:   . Frequency of Social Gatherings with Friends and Family:   . Attends Religious Services:   . Active Member of Clubs or Organizations:   . Attends Archivist Meetings:   Marland Kitchen Marital Status:   Intimate Partner Violence:   . Fear of Current or Ex-Partner:   . Emotionally Abused:   Marland Kitchen Physically Abused:   . Sexually Abused:     Family History:  Family History  Problem Relation Age of Onset  . Glaucoma Mother   . Atrial fibrillation Father     Medications:   Current Outpatient Medications on File Prior to Visit  Medication Sig Dispense Refill  . acetaminophen (TYLENOL) 325 MG tablet Take 1-2 tablets (325-650 mg total) by mouth every 4 (four) hours as needed for mild pain.    Marland Kitchen apixaban (ELIQUIS) 5 MG TABS tablet Take 1 tablet (5 mg total) by mouth 2 (two) times daily. 60 tablet 0  .  bethanechol (URECHOLINE) 25 MG tablet Take 0.5 tablets (12.5 mg total) by mouth 3 (three) times daily. 45 tablet 1  . diltiazem (CARDIZEM CD) 180 MG 24 hr capsule Take 1 capsule (180 mg total) by mouth daily. 30 capsule 1  . dofetilide (TIKOSYN) 500 MCG capsule Take 1 capsule (500 mcg total) by mouth 2 (two) times daily. 60 capsule 1  . guaiFENesin (MUCINEX) 600 MG 12 hr tablet Take 1 tablet (600 mg total) by mouth 2 (two) times daily as needed. 14 tablet 0  . metoprolol tartrate (LOPRESSOR) 50 MG tablet Take 1 tablet (50 mg total) by mouth 2 (two) times daily. 60 tablet 2  . rosuvastatin (CRESTOR) 10 MG tablet Take 1 tablet (10 mg total) by mouth daily after supper. 30 tablet 0  . senna-docusate (SENOKOT-S) 8.6-50 MG tablet Take 1 tablet by mouth at bedtime as needed for mild constipation.    . tamsulosin (FLOMAX) 0.4 MG CAPS capsule Take 1 capsule (0.4 mg total) by mouth daily at 8 pm. 30 capsule 0  . tiZANidine (ZANAFLEX) 2 MG tablet Take 1 tablet (2 mg total) by mouth 3 (three) times daily. 90 tablet 0   No current facility-administered  medications on file prior to visit.    Allergies:  No Known Allergies    OBJECTIVE:  Physical Exam  Vitals:   03/30/20 1301  BP: 124/82  Pulse: 62   There is no height or weight on file to calculate BMI. No exam data present  General: well developed, well nourished,  pleasant middle-age Caucasian man, seated, in no evident distress Head: head normocephalic and atraumatic.   Neck: supple with no carotid or supraclavicular bruits Cardiovascular: regular rate and rhythm, no murmurs Musculoskeletal: no deformity Skin:  no rash/petichiae Vascular:  Normal pulses all extremities   Neurologic Exam Mental Status: Awake and fully alert.   Moderate to severe aphasia with limited verbal output.  No evidence of receptive aphasia.  Difficulty fully assessing cognition but nods head yes/no appropriately to questions. Mood and affect appropriate.  Cranial Nerves: Fundoscopic exam reveals sharp disc margins. Pupils equal, briskly reactive to light. Extraocular movements full without nystagmus. Visual fields full to confrontation. Hearing intact. Facial sensation intact.  Mild right lower facial weakness.  Tongue, and palate moves normally and symmetrically.  Motor: Full strength left upper and lower extremity RUE: 3/5 deltoid, 4/5 bicep and tricep, limited hand movement RLE: 3/5 hip flexor, 4/5 knee extension and flexion and ankle plantar flexion, 0/5 dorsiflexion foot drop Sensory.:  Decreased light touch sensation right upper lower extremity.  Intact vibratory sensation. Coordination: Rapid alternating movements normal in all extremities, left side. Finger-to-nose and heel-to-shin performed accurately on left side. Gait and Station: Deferred as patient nonambulatory Reflexes: 1+ and symmetric. Toes downgoing.     NIHSS  8 Modified Rankin  3-4      ASSESSMENT: KEMOND AMORIN is a 64 y.o. year old male presented with L forced gaze and R hemiplegia on 01/27/2020 with stroke work up  revealing L MCA and ACA scattered infarcts s/p TICI3 of L MCA and A3, infarct embolic secondary to known AF on Xarelto therefore switched to Eliquis. Vascular risk factors include HTN, HLD, Afib, NICM and age.      PLAN:  1. L MCA and ACA stroke :  a. Residual deficit: Right hemiparesis with sensory impairment, moderate to severe aphasia and right lower facial weakness.  Continue participation in outpatient therapy for hopeful further improvement.  Due to right hemiparesis with  right foot drop, R AFO would be necessary to improve gait mechanics and help with spasticity. AFO would greatly benefit patient in regards to further recovery, safety and fall prevention b. Continue Eliquis (apixaban) daily  and Crestor  for secondary stroke prevention.  c. Close PCP follow up for aggressive stroke risk factor management  2. Atrial fibrillation:  a. continue eliquis.  b. Managed by cardiology 3. HTN:  a. BP goal <130/90.   b. Stable.   c. Continue f/u with PCP 4. HLD:  a. LDL goal <70. Recent LDL 33.  b. Continue Crestor.  c. F/u with PCP for management as well as prescribing of statin    Follow up in 3 months or call earlier if needed   I spent 45 minutes of face-to-face and non-face-to-face time with patient and brother-in-law.  This included previsit chart review, lab review, study review, order entry, electronic health record documentation, patient education regarding recent stroke, residual deficits, importance of managing stroke risk factors and answered all questions to patient satisfaction     [name], [facility]  [facility] [address] [place], [place] [address]  Phone [phone] Fax [phone] Note: This document was prepared with digital dictation and possible smart phrase technology. Any transcriptional errors that result from this process are unintentional.

## 2020-03-30 NOTE — Patient Instructions (Addendum)
Continue to work with therapy for likely ongoing improvement  Continue Eliquis (apixaban) daily  and Crestor  for secondary stroke prevention  Continue to follow with cardiology for atrial fibrillation monitoring and management  Continue to follow up with PCP regarding cholesterol and blood pressure management  Maintain strict control of hypertension with blood pressure goal below 130/90 and cholesterol with LDL cholesterol (bad cholesterol) goal below 70 mg/dL.       Followup in the future with me in 3 months or call earlier if needed       Thank you for coming to see [place] at [facility]. I hope we have been able to provide you high quality care today.  You may receive a patient satisfaction survey over the next few weeks. We would appreciate your feedback and comments so that we may continue to improve ourselves and the health of our patients.    Stroke Prevention Some medical conditions and behaviors are associated with a higher chance of having a stroke. You can help prevent a stroke by making nutrition, lifestyle, and other changes, including managing any medical conditions you may have. What nutrition changes can be made?   Eat healthy foods. You can do this by: ? Choosing foods high in fiber, such as fresh fruits and vegetables and whole grains. ? Eating at least 5 or more servings of fruits and vegetables a day. Try to fill half of your plate at each meal with fruits and vegetables. ? Choosing lean protein foods, such as lean cuts of meat, poultry without skin, fish, tofu, beans, and nuts. ? Eating low-fat dairy products. ? Avoiding foods that are high in salt (sodium). This can help lower blood pressure. ? Avoiding foods that have saturated fat, trans fat, and cholesterol. This can help prevent high cholesterol. ? Avoiding processed and premade foods.  Follow your health care provider's specific guidelines for losing weight, controlling high blood  pressure (hypertension), lowering high cholesterol, and managing diabetes. These may include: ? Reducing your daily calorie intake. ? Limiting your daily sodium intake to 1,500 milligrams (mg). ? Using only healthy fats for cooking, such as olive oil, canola oil, or sunflower oil. ? Counting your daily carbohydrate intake. What lifestyle changes can be made?  Maintain a healthy weight. Talk to your health care provider about your ideal weight.  Get at least 30 minutes of moderate physical activity at least 5 days a week. Moderate activity includes brisk walking, biking, and swimming.  Do not use any products that contain nicotine or tobacco, such as cigarettes and e-cigarettes. If you need help quitting, ask your health care provider. It may also be helpful to avoid exposure to secondhand smoke.  Limit alcohol intake to no more than 1 drink a day for nonpregnant women and 2 drinks a day for men. One drink equals 12 oz of beer, 5 oz of wine, or 1 oz of hard liquor.  Stop any illegal drug use.  Avoid taking birth control pills. Talk to your health care provider about the risks of taking birth control pills if: ? You are over 22 years old. ? You smoke. ? You get migraines. ? You have ever had a blood clot. What other changes can be made?  Manage your cholesterol levels. ? Eating a healthy diet is important for preventing high cholesterol. If cholesterol cannot be managed through diet alone, you may also need to take medicines. ? Take any prescribed medicines to control your cholesterol as told by your  health care provider.  Manage your diabetes. ? Eating a healthy diet and exercising regularly are important parts of managing your blood sugar. If your blood sugar cannot be managed through diet and exercise, you may need to take medicines. ? Take any prescribed medicines to control your diabetes as told by your health care provider.  Control your hypertension. ? To reduce your risk of  stroke, try to keep your blood pressure below 130/80. ? Eating a healthy diet and exercising regularly are an important part of controlling your blood pressure. If your blood pressure cannot be managed through diet and exercise, you may need to take medicines. ? Take any prescribed medicines to control hypertension as told by your health care provider. ? Ask your health care provider if you should monitor your blood pressure at home. ? Have your blood pressure checked every year, even if your blood pressure is normal. Blood pressure increases with age and some medical conditions.  Get evaluated for sleep disorders (sleep apnea). Talk to your health care provider about getting a sleep evaluation if you snore a lot or have excessive sleepiness.  Take over-the-counter and prescription medicines only as told by your health care provider. Aspirin or blood thinners (antiplatelets or anticoagulants) may be recommended to reduce your risk of forming blood clots that can lead to stroke.  Make sure that any other medical conditions you have, such as atrial fibrillation or atherosclerosis, are managed. What are the warning signs of a stroke? The warning signs of a stroke can be easily remembered as BEFAST.  B is for balance. Signs include: ? Dizziness. ? Loss of balance or coordination. ? Sudden trouble walking.  E is for eyes. Signs include: ? A sudden change in vision. ? Trouble seeing.  F is for face. Signs include: ? Sudden weakness or numbness of the face. ? The face or eyelid drooping to one side.  A is for arms. Signs include: ? Sudden weakness or numbness of the arm, usually on one side of the body.  S is for speech. Signs include: ? Trouble speaking (aphasia). ? Trouble understanding.  T is for time. ? These symptoms may represent a serious problem that is an emergency. Do not wait to see if the symptoms will go away. Get medical help right away. Call your local emergency services  (911 in the [place] Do not drive yourself to the hospital.  Other signs of stroke may include: ? A sudden, severe headache with no known cause. ? Nausea or vomiting. ? Seizure. Where to find more information For more information, visit:  American Stroke Association: www.strokeassociation.org  National Stroke Association: www.stroke.org Summary  You can prevent a stroke by eating healthy, exercising, not smoking, limiting alcohol intake, and managing any medical conditions you may have.  Do not use any products that contain nicotine or tobacco, such as cigarettes and e-cigarettes. If you need help quitting, ask your health care provider. It may also be helpful to avoid exposure to secondhand smoke.  Remember BEFAST for warning signs of stroke. Get help right away if you or a loved one has any of these signs. This information is not intended to replace advice given to you by your health care provider. Make sure you discuss any questions you have with your health care provider. Document Revised: 07/12/2017 Document Reviewed: 09/04/2016 Elsevier Patient Education  2020 [employer].

## 2020-03-31 ENCOUNTER — Encounter: Payer: Self-pay | Admitting: Occupational Therapy

## 2020-03-31 ENCOUNTER — Ambulatory Visit: Payer: BC Managed Care – PPO | Admitting: Occupational Therapy

## 2020-03-31 ENCOUNTER — Other Ambulatory Visit: Payer: Self-pay

## 2020-03-31 DIAGNOSIS — R2681 Unsteadiness on feet: Secondary | ICD-10-CM

## 2020-03-31 DIAGNOSIS — R2689 Other abnormalities of gait and mobility: Secondary | ICD-10-CM

## 2020-03-31 DIAGNOSIS — I69351 Hemiplegia and hemiparesis following cerebral infarction affecting right dominant side: Secondary | ICD-10-CM | POA: Diagnosis not present

## 2020-03-31 DIAGNOSIS — R278 Other lack of coordination: Secondary | ICD-10-CM

## 2020-03-31 NOTE — Patient Instructions (Addendum)
° ° ° ° °  Use your affected hand to perform the following activities for 20-30 minutes 1-2 times/day.  Stop activity if you experience pain.  - Wipe table top - Bring plastic cup/bottle to mouth, then return to table top and release -  Flip playing cards -  Pick up cotton balls, bottle caps, checkers, blocks and place in a container -  Fold washcloth -  Pick up a pencil  -  Use right arm/hand to help with bathing -  Try to lock brake of wheelchair with right hand -  Use both hands on cup when drinking -  Hold bottle in right hand and take lid off with left hand -  Pick up travel sized bottles and release on table -  Eat finger foods with right hand

## 2020-03-31 NOTE — Therapy (Signed)
[facility] [address] [place], [place], [address] Phone: [phone]   Fax:  [phone]  Occupational Therapy Treatment  Patient Details  Name: Dustin Schneider MRN: [id number] Date of Birth: 09-19-1955 No data recorded  Encounter Date: 03/31/2020   OT End of Session - 03/31/20 1207    Visit Number 5    Number of Visits 25    Date for OT Re-Evaluation 06/23/20    Authorization Type BC/BS - No auth required, covered 100%    OT Start Time 1110    OT Stop Time 1149    OT Time Calculation (min) 39 min    Activity Tolerance Patient tolerated treatment well    Behavior During Therapy [facility] for tasks assessed/performed           Past Medical History:  Diagnosis Date  . Hypertension   . NICM (nonischemic cardiomyopathy) ([place]) 06/15/2018   04/23/18: EF 45%, 09/15/18: NOrmal LVEF  . Paroxysmal atrial fibrillation ([place]) 06/15/2018  . Stroke ([place])   . Visit for monitoring Tikosyn therapy 06/12/2018    Past Surgical History:  Procedure Laterality Date  . BUBBLE STUDY  03/11/2020   Procedure: BUBBLE STUDY;  Surgeon: [name], MD;  Location: [facility];  Service: Cardiovascular;;  . CARDIOVERSION N/A 05/13/2018   Procedure: CARDIOVERSION;  Surgeon: [name], MD;  Location: [facility] ENDOSCOPY;  Service: Cardiovascular;  Laterality: N/A;  . CARDIOVERSION N/A 06/13/2018   Procedure: CARDIOVERSION;  Surgeon: [name], MD;  Location: [facility] ENDOSCOPY;  Service: Cardiovascular;  Laterality: N/A;  . CARDIOVERSION N/A 06/23/2018   Procedure: CARDIOVERSION;  Surgeon: [name], MD;  Location: [facility] ENDOSCOPY;  Service: Cardiovascular;  Laterality: N/A;  . CARDIOVERSION N/A 03/11/2020   Procedure: CARDIOVERSION;  Surgeon: [name], MD;  Location: [facility];  Service: Cardiovascular;  Laterality: N/A;  . excision of actinic keratosis    . EYE SURGERY     eye lift  . IR CT HEAD LTD  01/27/2020  . IR PERCUTANEOUS ART  THROMBECTOMY/INFUSION INTRACRANIAL INC DIAG ANGIO  01/27/2020      . IR PERCUTANEOUS ART THROMBECTOMY/INFUSION INTRACRANIAL INC DIAG ANGIO  01/27/2020  . RADIOLOGY WITH ANESTHESIA N/A 01/27/2020   Procedure: IR WITH ANESTHESIA;  Surgeon: Radiologist, Medication, MD;  Location: [facility];  Service: Radiology;  Laterality: N/A;  . TEE WITHOUT CARDIOVERSION N/A 03/11/2020   Procedure: TRANSESOPHAGEAL ECHOCARDIOGRAM (TEE);  Surgeon: [name], MD;  Location: [facility];  Service: Cardiovascular;  Laterality: N/A;  . TONSILLECTOMY    . WISDOM TOOTH EXTRACTION      There were no vitals filed for this visit.   Subjective Assessment - 03/31/20 1114    Subjective  No, when asked if he has pain    Patient is accompanied by: Family member   BROTHER   Pertinent History Lt MCA CVA 01/27/20, readmitted to hospital with HAP and fever on 03/06/20 and released 03/11/20. PMH: paroxsymal A-fib, HTN    Limitations fall    Currently in Pain? No/denies            AROM supination/pronation, finger flex/ext (gross), elbow flex/ext, wt. Bearing on R elbow with push to sitting (mod cues for decr compensation/improved positioning)--reviewed from initial HEP with min cueing.  Girlfriend report pt slid forward to floor from [facility] when performing toileting hygiene.  Recommended ensuring that both handrails are in upright position and R foot is positioned well on floor so that pt can wt. Shift to the R vs. Scooting forward to  perform hygiene.             OT Education - 03/31/20 1209    Education Details RUE functional use HEP--see pt instructions    Person(s) Educated Patient;Caregiver(s)    Methods Explanation;Demonstration;Verbal cues;Handout;Tactile cues    Comprehension Verbalized understanding;Returned demonstration;Verbal cues required            OT Short Term Goals - 03/23/20 1342      OT SHORT TERM GOAL #1   Title Independent with initial HEP    Time 4    Period Weeks    Status New      OT  SHORT TERM GOAL #2   Title Pt to perform low to mid level reaching of light objects RUE in prep for feeding self and bathing    Time 4    Period Weeks    Status New      OT SHORT TERM GOAL #3   Title Pt to feed self 50% of the time with Rt dominant hand and A/E prn    Time 4    Period Weeks    Status New      OT SHORT TERM GOAL #4   Title Pt to perform UE dressing of pullover shirt mod I level    Time 4    Period Weeks    Status New      OT SHORT TERM GOAL #5   Title Pt to perform LE dressing and all bathing with no more than mod assist using A/E and strategies prn    Time 4    Period Weeks    Status New      Additional Short Term Goals   Additional Short Term Goals Yes      OT SHORT TERM GOAL #6   Title Pt to perform toileting and toilet transfers with no more than min assist overall    Time 4    Period Weeks    Status New      OT SHORT TERM GOAL #7   Title Improve functional use of RUE as evidenced by performing 8 blocks on Box & Blocks test    Baseline 0    Time 4    Period Weeks    Status New             OT Long Term Goals - 03/23/20 1346      OT LONG TERM GOAL #1   Title Pt to be independent with updated HEP    Time 12    Period Weeks    Status New      OT LONG TERM GOAL #2   Title Pt to perform all BADLS at mod I level    Time 12    Period Weeks    Status New      OT LONG TERM GOAL #3   Title Pt to feed self 75% or greater with Rt dominant hand and grooming 50% or more with Rt hand    Time 12    Period Weeks    Status New      OT LONG TERM GOAL #4   Title Pt to retrieve light weight objects from high shelf RUE consistently    Time 12    Period Weeks    Status New      OT LONG TERM GOAL #5   Title Pt to demo 25 lbs grip strength or more Rt dominant hand for gripping, opening jars/containers    Time 12  Period Weeks    Status New      Long Term Additional Goals   Additional Long Term Goals Yes      OT LONG TERM GOAL #6   Title Pt  to perform simple meal prep and light cleaning tasks mod I level    Time 12    Period Weeks    Status New      OT LONG TERM GOAL #7   Title Improve functional use RUE as evidenced by performing 25 blocks on Box & Blocks and performing 9 hole peg test in 90 sec or under    Time 12    Period Weeks    Status New      OT LONG TERM GOAL #8   Title Pt to returned to modified art/sculpting tasks with task modifications and A/E prn    Time 12    Period Weeks    Status New                 Plan - 03/31/20 1207    Clinical Impression Statement Pt is progressing towards goals with improved RUE functional use and control.    OT Occupational Profile and History Detailed Assessment- Review of Records and additional review of physical, cognitive, psychosocial history related to current functional performance    Occupational performance deficits (Please refer to evaluation for details): ADL's;IADL's;Work;Leisure;Social Participation    Body Structure / Function / Physical Skills ADL;ROM;Dexterity;Balance;IADL;Body mechanics;Improper spinal/pelvic alignment;Sensation;Mobility;Flexibility;Strength;Coordination;FMC;Tone;UE functional use;Decreased knowledge of use of DME;Proprioception    Rehab Potential Good    Clinical Decision Making Several treatment options, min-mod task modification necessary    Comorbidities Affecting Occupational Performance: May have comorbidities impacting occupational performance    Modification or Assistance to Complete Evaluation  Min-Moderate modification of tasks or assist with assess necessary to complete eval    OT Frequency 2x / week    OT Duration 12 weeks   pluse eval   OT Treatment/Interventions Self-care/ADL training;Therapeutic exercise;Functional Mobility Training;Neuromuscular education;Manual Therapy;Splinting;Aquatic Therapy;Manual lymph drainage;Therapeutic activities;Coping strategies training;DME and/or AE instruction;Electrical  Stimulation;Fluidtherapy;Passive range of motion;Patient/family education    Plan functional use RUE, NMR    Consulted and Agree with Plan of Care Patient           Patient will benefit from skilled therapeutic intervention in order to improve the following deficits and impairments:   Body Structure / Function / Physical Skills: ADL, ROM, Dexterity, Balance, IADL, Body mechanics, Improper spinal/pelvic alignment, Sensation, Mobility, Flexibility, Strength, Coordination, FMC, Tone, UE functional use, Decreased knowledge of use of DME, Proprioception       Visit Diagnosis: Hemiplegia and hemiparesis following cerebral infarction affecting right dominant side (HCC)  Other lack of coordination  Other abnormalities of gait and mobility  Unsteadiness on feet    Problem List Patient Active Problem List   Diagnosis Date Noted  . Neurogenic bladder 03/17/2020  . Sepsis due to pneumonia ([place]) 03/06/2020  . History of stroke with residual deficit 03/06/2020  . Transient hypotension 03/06/2020  . Spastic hemiplegia affecting nondominant side ([place])   . History of hypertension   . Expressive aphasia   . AKI (acute kidney injury) ([place])   . Global aphasia   . Hypoalbuminemia due to protein-calorie malnutrition ([place])   . Dysphagia, post-stroke   . Hyperlipidemia 02/02/2020  . Dysphagia following cerebral infarction 02/02/2020  . Aspiration pneumonia ([place]) 02/02/2020  . Acute ischemic left MCA stroke ([place]) 02/02/2020  . Acute respiratory failure ([place])   . Acute ischemic stroke ([place])  s/p clot retrieval L MCA & ACA A3 01/27/2020  . Aspiration into airway 01/27/2020  . Chronic anticoagulation 01/27/2020  . Paroxysmal atrial fibrillation ([place]) 06/15/2018  . Essential hypertension 06/15/2018  . NICM (nonischemic cardiomyopathy) ([place]) 06/15/2018  . Visit for monitoring Tikosyn therapy 06/12/2018    [facility] 03/31/2020, 12:12 PM  [facility] [address] [place], [place], [address] Phone: [phone]   Fax:  [phone]  Name: GEDEON BRANDOW MRN: [id number] Date of Birth: 05-Dec-1955   Vianne Bulls, OTR/L [facility] [address] [place], [place]  [address] [phone] phone [phone] 03/31/20 12:12 PM

## 2020-04-01 ENCOUNTER — Ambulatory Visit: Payer: BC Managed Care – PPO | Admitting: Physical Therapy

## 2020-04-01 ENCOUNTER — Ambulatory Visit: Payer: BC Managed Care – PPO

## 2020-04-01 ENCOUNTER — Encounter: Payer: Self-pay | Admitting: Physical Therapy

## 2020-04-01 DIAGNOSIS — R278 Other lack of coordination: Secondary | ICD-10-CM

## 2020-04-01 DIAGNOSIS — R4701 Aphasia: Secondary | ICD-10-CM

## 2020-04-01 DIAGNOSIS — I69351 Hemiplegia and hemiparesis following cerebral infarction affecting right dominant side: Secondary | ICD-10-CM

## 2020-04-01 DIAGNOSIS — R2689 Other abnormalities of gait and mobility: Secondary | ICD-10-CM

## 2020-04-01 DIAGNOSIS — M6281 Muscle weakness (generalized): Secondary | ICD-10-CM

## 2020-04-01 DIAGNOSIS — R482 Apraxia: Secondary | ICD-10-CM

## 2020-04-01 DIAGNOSIS — R2681 Unsteadiness on feet: Secondary | ICD-10-CM

## 2020-04-01 NOTE — Therapy (Signed)
[facility] [address] [place], [place], [address] Phone: [phone]   Fax:  [phone]  Physical Therapy Treatment  Patient Details  Name: Dustin Schneider MRN: [id number] Date of Birth: [birth year] Referring Provider (PT): Lauraine Rinne, PA-C (will be followed by Dr. Posey Pronto)   Encounter Date: 04/01/2020   PT End of Session - 04/01/20 1156    Visit Number 6    Number of Visits 25    Date for PT Re-Evaluation 06/14/20    Authorization Type BCBS    PT Start Time 0759    PT Stop Time 0846    PT Time Calculation (min) 47 min    Equipment Utilized During Treatment Gait belt    Activity Tolerance Patient tolerated treatment well    Behavior During Therapy [facility] for tasks assessed/performed           Past Medical History:  Diagnosis Date  . Hypertension   . NICM (nonischemic cardiomyopathy) ([place]) 06/15/2018   04/23/18: EF 45%, 09/15/18: NOrmal LVEF  . Paroxysmal atrial fibrillation ([place]) 06/15/2018  . Stroke ([place])   . Visit for monitoring Tikosyn therapy 06/12/2018    Past Surgical History:  Procedure Laterality Date  . BUBBLE STUDY  03/11/2020   Procedure: BUBBLE STUDY;  Surgeon: [name], MD;  Location: [facility];  Service: Cardiovascular;;  . CARDIOVERSION N/A 05/13/2018   Procedure: CARDIOVERSION;  Surgeon: [name], MD;  Location: [facility] ENDOSCOPY;  Service: Cardiovascular;  Laterality: N/A;  . CARDIOVERSION N/A 06/13/2018   Procedure: CARDIOVERSION;  Surgeon: [name], MD;  Location: [facility] ENDOSCOPY;  Service: Cardiovascular;  Laterality: N/A;  . CARDIOVERSION N/A 06/23/2018   Procedure: CARDIOVERSION;  Surgeon: [name], MD;  Location: [facility] ENDOSCOPY;  Service: Cardiovascular;  Laterality: N/A;  . CARDIOVERSION N/A 03/11/2020   Procedure: CARDIOVERSION;  Surgeon: [name], MD;  Location: [facility];  Service: Cardiovascular;  Laterality: N/A;  . excision of actinic keratosis    . EYE SURGERY      eye lift  . IR CT HEAD LTD  01/27/2020  . IR PERCUTANEOUS ART THROMBECTOMY/INFUSION INTRACRANIAL INC DIAG ANGIO  01/27/2020      . IR PERCUTANEOUS ART THROMBECTOMY/INFUSION INTRACRANIAL INC DIAG ANGIO  01/27/2020  . RADIOLOGY WITH ANESTHESIA N/A 01/27/2020   Procedure: IR WITH ANESTHESIA;  Surgeon: Radiologist, Medication, MD;  Location: [facility];  Service: Radiology;  Laterality: N/A;  . TEE WITHOUT CARDIOVERSION N/A 03/11/2020   Procedure: TRANSESOPHAGEAL ECHOCARDIOGRAM (TEE);  Surgeon: [name], MD;  Location: [facility];  Service: Cardiovascular;  Laterality: N/A;  . TONSILLECTOMY    . WISDOM TOOTH EXTRACTION      There were no vitals filed for this visit.   Subjective Assessment - 04/01/20 0802    Subjective Was on the bedside commode on wednesday and leaned too far forward and fell forward. Did not hurt himself. Brother in Sports coach [name] helped to get him up.    Patient is accompained by: Family member   [name], Brother in Sports coach   Pertinent History PMH: HTN, paroxysmal a fib    Limitations Standing;Walking    Patient Stated Goals "wants everything working on R side again"    Currently in Pain? No/denies                             [facility] Adult PT Treatment/Exercise - 04/01/20 0001      Transfers   Transfers Sit to Stand;Stand to [employer]  Transfers    Sit to Stand 4: Min guard;4: Min assist;With upper extremity assist;From bed;From chair/3-in-1    Sit to Stand Details Verbal cues for technique;Verbal cues for safe use of DME/AE;Manual facilitation for weight shifting    Sit to Stand Details (indicate cue type and reason) therapist placing RLE in proper alignment prior to standing, cues to push off with LUE    Stand to Sit 4: Min guard;4: Min assist;With upper extremity assist;To chair/3-in-1;To bed;Uncontrolled descent    Stand to Sit Details (indicate cue type and reason) Verbal cues for sequencing;Verbal cues for safe use of DME/AE;Manual facilitation for weight  shifting;Manual facilitation for placement    Stand to Sit Details cues to remove R hand from orthosis prior to sitting    Stand Pivot Transfers 4: Min assist    Stand Pivot Transfer Details (indicate cue type and reason) PT blocking R knee and making sure R ankle stays in proper positioning, from w/c > mat table      Ambulation/Gait   Ambulation/Gait Yes    Ambulation/Gait Assistance 4: Min assist   x2   Ambulation/Gait Assistance Details used anterior ottobock with t-strap to assist with ankle stability to RLE with all gait and R hand orthosis on RW. Pt initially more fatigued with gait after use of Bioness. Assist from therapist needed for proper step placement and blocking of knee in stance at times, also provided verbal and tactile cues for R quad activation. second person for safety/balance with gait. Cues needed for weight shifting over RLE in stance and posture. Utilized a blue shoe cover over R foot with pt demonstrating improved foot clearance during swing phase . W/c follow from pt's brother in law for safety     Ambulation Distance (Feet) 115 Feet   x1   Assistive device Rolling walker    Gait Pattern Step-to pattern;Decreased stride length;Decreased stance time - right;Decreased step length - right;Decreased hip/knee flexion - right;Decreased weight shift to right;Right steppage;Lateral hip instability;Decreased trunk rotation;Narrow base of support;Poor foot clearance - right;Step-through pattern    Ambulation Surface Level;Indoor      Neuro Re-ed    Neuro Re-ed Details  Neuro re-ed concurrent with Bioness to R ant tib and quad in training mode: 2 x 10 reps ankle DF on rockerboard in A/P direction - assist from therapist for maintaining foot in proper position, x10 Reps buttock raises during on times and seated with off times with BUE support on chair, min A and cues for nose over toes, x10 reps sit <> stands with use of RW for on times - needing min A for safety and foot placement with  RLE at times and cues for weight shift/quad activation, holding with LUE in standing for balance, x10 reps mini squats during on times with BUE support on chair and 2" block under LLE for incr weight bearing/shifting towards RLE       Acupuncturist Location R ant tib and R quad    Electrical Stimulation Action for incr muscle activation and strengthening, proprioception    Electrical Stimulation Parameters see tablet 1 for details    Electrical Stimulation Goals Strength;Neuromuscular facilitation                  PT Education - 04/01/20 1156    Education Details orthotist coming on Monday to assess for AFO    Person(s) Educated Patient   wilson   Methods Explanation    Comprehension Verbalized understanding  nodded           PT Short Term Goals - 03/16/20 1136      PT SHORT TERM GOAL #1   Title Pt and pt's family members will be independent with initial HEP in order to build upon functional gains made in therapy. ALL LTGS DUE 04/13/20    Time 4    Period Weeks    Status New    Target Date 04/13/20      PT SHORT TERM GOAL #2   Title Pt will perform sit > supine with supervision and supine > sit with min guard for bed mobility in order to decr caregiver burden.    Time 4    Period Weeks    Status New      PT SHORT TERM GOAL #3   Title Pt will undergo assessment of AFO for functional transfers and gait.    Time 4    Period Weeks    Status New      PT SHORT TERM GOAL #4   Title Pt will ambulate 49' with RW with mod A x1 in order to improve functional mobility.    Time 4    Period Weeks    Status New      PT SHORT TERM GOAL #5   Title Pt will perform sit <> stand with RW vs. Stedy with min A in order to improve functional BLE strength for transfers.    Time 4    Period Weeks    Status New      Additional Short Term Goals   Additional Short Term Goals Yes      PT SHORT TERM GOAL #6   Title Pt will perform squat pivot  transfers from w/c <> mat table with min guard.    Baseline min A for blocking R knee    Time 4    Period Weeks    Status New             PT Long Term Goals - 03/16/20 1139      PT LONG TERM GOAL #1   Title Pt and pt's family members will be independent with final HEP in order to build upon functional gains made in therapy. ALL LTGS DUE 06/08/20    Time 12    Period Weeks    Status New    Target Date 06/08/20      PT LONG TERM GOAL #2   Title Pt will ambulate 115' with min guard/min A with RW and R AFO in order to improve household mobility.    Time 12    Period Weeks    Status New      PT LONG TERM GOAL #3   Title Pt will perform sit <> stand with RW with min guard in order to improve functional BLE strength for transfers.    Time 12    Period Weeks    Status New      PT LONG TERM GOAL #4   Title Pt will perform squat pivot transfer with mod I vs. stand pivot transfer with RW with min guard in order to decr caregiver burden.    Time 12    Period Weeks    Status New                 Plan - 04/01/20 1158    Clinical Impression Statement Focus of today's skilled session was using Bioness on R ant tib and R quad for strengthening and  NMR. Needing min/mod A for balance and weight shifting with activity and proper R foot placement for improved ankle positioning and wider BOS. Continued to ambulate with blue shoe cover, R hand orthosis, and R ottobock AFO - 2nd person (PT tech) for assist with balance and therapist on stool providing assist for foot placement on RLE. Orthotist to be present at session on Monday. Pt tolerated session well, will continue to progress towards LTGs.    Personal Factors and Comorbidities Comorbidity 3+    Comorbidities dense L MCA CVA, paroxysmal A. fib on chronic Xarelto, nonischemic cardiomyopathy and hypertension.    Examination-Activity Limitations Bathing;Bed Mobility;Bend;Dressing;Hygiene/Grooming;Stand;Squat;Locomotion Level;Transfers     Examination-Participation Restrictions Community Activity;Yard Work;Shop   walking his dog, works as an Electrical engineer Evolving/Moderate complexity    Rehab Potential Good    PT Frequency 2x / week    PT Duration 12 weeks    PT Treatment/Interventions ADLs/Self Care Home Management;Aquatic Therapy;DME Instruction;Gait training;Stair training;Functional mobility training;Therapeutic activities;Therapeutic exercise;Electrical Stimulation;Balance training;Neuromuscular re-education;Wheelchair mobility training;Orthotic Fit/Training;Patient/family education;Passive range of motion;Energy conservation    PT Next Visit Plan orthotist to be present on Monday. continue gait training with brace, t-strap and hand orthotic; bioness to LE for strengthening/muscle re-ed, weight shifting/NMR to RLE    Consulted and Agree with Plan of Care Patient;Family member/caregiver    Family Member Consulted brother in Art gallery manager           Patient will benefit from skilled therapeutic intervention in order to improve the following deficits and impairments:  Abnormal gait, Decreased activity tolerance, Decreased balance, Decreased cognition, Decreased mobility, Decreased coordination, Decreased range of motion, Decreased endurance, Decreased strength, Hypomobility, Difficulty walking, Impaired UE functional use, Impaired vision/preception, Postural dysfunction, Impaired sensation  Visit Diagnosis: Other lack of coordination  Hemiplegia and hemiparesis following cerebral infarction affecting right dominant side (HCC)  Other abnormalities of gait and mobility  Unsteadiness on feet  Muscle weakness (generalized)     Problem List Patient Active Problem List   Diagnosis Date Noted  . Neurogenic bladder 03/17/2020  . Sepsis due to pneumonia ([place]) 03/06/2020  . History of stroke with residual deficit 03/06/2020  . Transient hypotension 03/06/2020  . Spastic hemiplegia  affecting nondominant side ([place])   . History of hypertension   . Expressive aphasia   . AKI (acute kidney injury) ([place])   . Global aphasia   . Hypoalbuminemia due to protein-calorie malnutrition ([place])   . Dysphagia, post-stroke   . Hyperlipidemia 02/02/2020  . Dysphagia following cerebral infarction 02/02/2020  . Aspiration pneumonia ([place]) 02/02/2020  . Acute ischemic left MCA stroke ([place]) 02/02/2020  . Acute respiratory failure ([place])   . Acute ischemic stroke [facility]) s/p clot retrieval L MCA & ACA A3 01/27/2020  . Aspiration into airway 01/27/2020  . Chronic anticoagulation 01/27/2020  . Paroxysmal atrial fibrillation ([place]) 06/15/2018  . Essential hypertension 06/15/2018  . NICM (nonischemic cardiomyopathy) ([place]) 06/15/2018  . Visit for monitoring Tikosyn therapy 06/12/2018    Arliss Journey, PT, DPT  04/01/2020, 12:08 PM  [facility] [address] [place], [place], [address] Phone: [phone]   Fax:  [phone]  Name: TRAVON CROCHET MRN: [id number] Date of Birth: 14-Dec-1955

## 2020-04-01 NOTE — Progress Notes (Signed)
I agree with the above plan 

## 2020-04-03 NOTE — Therapy (Signed)
[facility] [address] [place], [place], [address] Phone: [phone]   Fax:  [phone]  Speech Language Pathology Treatment  Patient Details  Name: Dustin Schneider MRN: [id number] Date of Birth: 06-Jun-1956 Referring Provider (SLP): Dr. Delice Lesch   Encounter Date: 04/01/2020   End of Session - 04/03/20 1643    Visit Number 3    Number of Visits 25    Date for SLP Re-Evaluation 06/15/20    SLP Start Time 0851    SLP Stop Time  0932    SLP Time Calculation (min) 41 min    Activity Tolerance Patient tolerated treatment well           Past Medical History:  Diagnosis Date  . Hypertension   . NICM (nonischemic cardiomyopathy) ([place]) 06/15/2018   04/23/18: EF 45%, 09/15/18: NOrmal LVEF  . Paroxysmal atrial fibrillation ([place]) 06/15/2018  . Stroke ([place])   . Visit for monitoring Tikosyn therapy 06/12/2018    Past Surgical History:  Procedure Laterality Date  . BUBBLE STUDY  03/11/2020   Procedure: BUBBLE STUDY;  Surgeon: [name], MD;  Location: [facility];  Service: Cardiovascular;;  . CARDIOVERSION N/A 05/13/2018   Procedure: CARDIOVERSION;  Surgeon: [name], MD;  Location: [facility] ENDOSCOPY;  Service: Cardiovascular;  Laterality: N/A;  . CARDIOVERSION N/A 06/13/2018   Procedure: CARDIOVERSION;  Surgeon: [name], MD;  Location: [facility] ENDOSCOPY;  Service: Cardiovascular;  Laterality: N/A;  . CARDIOVERSION N/A 06/23/2018   Procedure: CARDIOVERSION;  Surgeon: [name], MD;  Location: [facility] ENDOSCOPY;  Service: Cardiovascular;  Laterality: N/A;  . CARDIOVERSION N/A 03/11/2020   Procedure: CARDIOVERSION;  Surgeon: [name], MD;  Location: [facility];  Service: Cardiovascular;  Laterality: N/A;  . excision of actinic keratosis    . EYE SURGERY     eye lift  . IR CT HEAD LTD  01/27/2020  . IR PERCUTANEOUS ART THROMBECTOMY/INFUSION INTRACRANIAL INC DIAG ANGIO  01/27/2020      . IR PERCUTANEOUS ART  THROMBECTOMY/INFUSION INTRACRANIAL INC DIAG ANGIO  01/27/2020  . RADIOLOGY WITH ANESTHESIA N/A 01/27/2020   Procedure: IR WITH ANESTHESIA;  Surgeon: Radiologist, Medication, MD;  Location: [facility];  Service: Radiology;  Laterality: N/A;  . TEE WITHOUT CARDIOVERSION N/A 03/11/2020   Procedure: TRANSESOPHAGEAL ECHOCARDIOGRAM (TEE);  Surgeon: [name], MD;  Location: [facility];  Service: Cardiovascular;  Laterality: N/A;  . TONSILLECTOMY    . WISDOM TOOTH EXTRACTION      There were no vitals filed for this visit.   Subjective Assessment - 04/03/20 1637    Subjective Pt has been practicing cloze phrases at home.    Patient is accompained by: Family member   wilson - BIL   Currently in Pain? No/denies                 ADULT SLP TREATMENT - 04/03/20 0001      General Information   Behavior/Cognition Alert;Cooperative;Pleasant mood      Treatment Provided   Treatment provided Cognitive-Linquistic      Cognitive-Linquistic Treatment   Treatment focused on Apraxia;Aphasia    Skilled Treatment Cloze phrases practiced at home. SLP worked with pt's verbal expression today with object naming. Pt spontaneously used gestures and SLP encouraged pt to cont to do this at home. 35% success functional articulation on first attempt. When written cues were used pt had success 100%. SLP also asked pt to expressively ID function 65% functional artic (one word). SLP educated pt and BIL about  difference between apraxia and aphasia.       Assessment / Recommendations / Plan   Plan Continue with current plan of care      Progression Toward Goals   Progression toward goals Progressing toward goals            SLP Education - 04/03/20 1642    Education Details aphasia vs. apraxia - differences between these two    Person(s) Educated Patient;Caregiver(s)    Methods Explanation    Comprehension Verbalized understanding            SLP Short Term Goals - 04/03/20 1650      SLP SHORT TERM GOAL  #1   Title Pt will name family, friends and art items with occasional min A 8/10 with approximations allowed.    Time 6    Period Weeks    Status On-going      SLP SHORT TERM GOAL #2   Title Pt will name 7 items in personally relevant categories with occasional min A over 2 sessions    Time 6    Period Weeks    Status On-going      SLP SHORT TERM GOAL #3   Title Pt will perform 4 automatic speech tasks with rare min A.    Time 6    Period Weeks    Status On-going      SLP SHORT TERM GOAL #4   Title Pt will write personal information (name/address/phone), 5 family/friend/pet names and 7  professional words with occasional min A    Time 6    Period Weeks    Status On-going      SLP SHORT TERM GOAL #5   Title Pt will use mulitmodal communication (gesture, draw, write 1st letter etc) to augment verbal expression to meet needs at home with rare min A from family.    Time 6    Period Weeks    Status On-going            SLP Long Term Goals - 04/03/20 1650      SLP LONG TERM GOAL #1   Title Pt will verbalize 3 words to explain or describe his artwork and hobbies with occasional min A over 3 sessions, allowing for intellgible approximations    Time 12    Period Weeks    Status On-going      SLP LONG TERM GOAL #2   Title Pt and family will utilize multimodal compensations for aphasia and verbal apraxia to participate in 3 turns each of conversation with occasional min A over 2 sessions    Time 12    Period Weeks    Status On-going      SLP LONG TERM GOAL #3   Title Pt will write 3 word phrases with less than 2 errors with rare min A over 3 sessions    Time 12    Period Weeks    Status On-going      SLP LONG TERM GOAL #4   Title Reading assessment if indicated    Time 7    Period Weeks    Status On-going            Plan - 04/03/20 1644    Clinical Impression Statement Pt presents today with cont'd mod Broca's type aphasia and mod-severe verbal apraxia. SLP See  "skilled intervention" for more details. I recommend skilled ST to maximize communication for QOL, safety to reduce family burden.    Speech Therapy Frequency 2x /  week    Duration --   12 weeks or 25 visits   Treatment/Interventions Environmental controls;Cueing hierarchy;SLP instruction and feedback;Compensatory strategies;Functional tasks;Cognitive reorganization;Compensatory techniques;Internal/external aids;Multimodal communcation approach;Patient/family education;Language facilitation    Potential to Achieve Goals Good    Potential Considerations Severity of impairments           Patient will benefit from skilled therapeutic intervention in order to improve the following deficits and impairments:   Aphasia  Verbal apraxia    Problem List Patient Active Problem List   Diagnosis Date Noted  . Neurogenic bladder 03/17/2020  . Sepsis due to pneumonia ([place]) 03/06/2020  . History of stroke with residual deficit 03/06/2020  . Transient hypotension 03/06/2020  . Spastic hemiplegia affecting nondominant side ([place])   . History of hypertension   . Expressive aphasia   . AKI (acute kidney injury) ([place])   . Global aphasia   . Hypoalbuminemia due to protein-calorie malnutrition ([place])   . Dysphagia, post-stroke   . Hyperlipidemia 02/02/2020  . Dysphagia following cerebral infarction 02/02/2020  . Aspiration pneumonia ([place]) 02/02/2020  . Acute ischemic left MCA stroke ([place]) 02/02/2020  . Acute respiratory failure ([place])   . Acute ischemic stroke [facility]) s/p clot retrieval L MCA & ACA A3 01/27/2020  . Aspiration into airway 01/27/2020  . Chronic anticoagulation 01/27/2020  . Paroxysmal atrial fibrillation ([place]) 06/15/2018  . Essential hypertension 06/15/2018  . NICM (nonischemic cardiomyopathy) ([place]) 06/15/2018  . Visit for monitoring Tikosyn therapy 06/12/2018    [facility] ,[place]  04/03/2020, 4:50 PM  [facility] [address] [place], [place], [address] Phone: [phone]   Fax:  [phone]   Name: JORJE VANATTA MRN: [id number] Date of Birth: January 21, 1956

## 2020-04-04 ENCOUNTER — Encounter: Payer: Self-pay | Admitting: Speech Pathology

## 2020-04-04 ENCOUNTER — Other Ambulatory Visit: Payer: Self-pay

## 2020-04-04 ENCOUNTER — Ambulatory Visit: Payer: BC Managed Care – PPO | Admitting: Speech Pathology

## 2020-04-04 ENCOUNTER — Ambulatory Visit: Payer: BC Managed Care – PPO | Admitting: Physical Therapy

## 2020-04-04 ENCOUNTER — Telehealth: Payer: Self-pay

## 2020-04-04 DIAGNOSIS — M6281 Muscle weakness (generalized): Secondary | ICD-10-CM

## 2020-04-04 DIAGNOSIS — R2681 Unsteadiness on feet: Secondary | ICD-10-CM

## 2020-04-04 DIAGNOSIS — R2689 Other abnormalities of gait and mobility: Secondary | ICD-10-CM

## 2020-04-04 DIAGNOSIS — R4701 Aphasia: Secondary | ICD-10-CM

## 2020-04-04 DIAGNOSIS — R278 Other lack of coordination: Secondary | ICD-10-CM

## 2020-04-04 DIAGNOSIS — I69351 Hemiplegia and hemiparesis following cerebral infarction affecting right dominant side: Secondary | ICD-10-CM | POA: Diagnosis not present

## 2020-04-04 DIAGNOSIS — R482 Apraxia: Secondary | ICD-10-CM

## 2020-04-04 MED ORDER — DILTIAZEM HCL ER COATED BEADS 180 MG PO CP24: 180.0000 mg | ORAL_CAPSULE | Freq: Every day | ORAL | 3 refills | Status: DC

## 2020-04-04 MED ORDER — ROSUVASTATIN CALCIUM 10 MG PO TABS: 10.0000 mg | ORAL_TABLET | Freq: Every day | ORAL | 3 refills | Status: DC

## 2020-04-04 MED ORDER — TAMSULOSIN HCL 0.4 MG PO CAPS: 0.4000 mg | ORAL_CAPSULE | Freq: Every day | ORAL | 3 refills | Status: DC

## 2020-04-04 MED ORDER — DOFETILIDE 500 MCG PO CAPS: 500.0000 ug | ORAL_CAPSULE | Freq: Two times a day (BID) | ORAL | 0 refills | Status: DC

## 2020-04-04 MED ORDER — APIXABAN 5 MG PO TABS: 5.0000 mg | ORAL_TABLET | Freq: Two times a day (BID) | ORAL | 3 refills | Status: DC

## 2020-04-04 NOTE — Therapy (Signed)
[facility] [address] [place], [place], [address] Phone: [phone]   Fax:  [phone]  Speech Language Pathology Treatment  Patient Details  Name: Dustin Schneider MRN: [id number] Date of Birth: October 20, 1955 Referring Provider (SLP): Dr. Delice Lesch   Encounter Date: 04/04/2020   End of Session - 04/04/20 1223    Visit Number 4    Number of Visits 25    Date for SLP Re-Evaluation 06/15/20    SLP Start Time 0848    SLP Stop Time  0931    SLP Time Calculation (min) 43 min           Past Medical History:  Diagnosis Date  . Hypertension   . NICM (nonischemic cardiomyopathy) ([place]) 06/15/2018   04/23/18: EF 45%, 09/15/18: NOrmal LVEF  . Paroxysmal atrial fibrillation ([place]) 06/15/2018  . Stroke ([place])   . Visit for monitoring Tikosyn therapy 06/12/2018    Past Surgical History:  Procedure Laterality Date  . BUBBLE STUDY  03/11/2020   Procedure: BUBBLE STUDY;  Surgeon: [name], MD;  Location: [facility];  Service: Cardiovascular;;  . CARDIOVERSION N/A 05/13/2018   Procedure: CARDIOVERSION;  Surgeon: [name], MD;  Location: [facility] ENDOSCOPY;  Service: Cardiovascular;  Laterality: N/A;  . CARDIOVERSION N/A 06/13/2018   Procedure: CARDIOVERSION;  Surgeon: [name], MD;  Location: [facility] ENDOSCOPY;  Service: Cardiovascular;  Laterality: N/A;  . CARDIOVERSION N/A 06/23/2018   Procedure: CARDIOVERSION;  Surgeon: [name], MD;  Location: [facility] ENDOSCOPY;  Service: Cardiovascular;  Laterality: N/A;  . CARDIOVERSION N/A 03/11/2020   Procedure: CARDIOVERSION;  Surgeon: [name], MD;  Location: [place];  Service: Cardiovascular;  Laterality: N/A;  . excision of actinic keratosis    . EYE SURGERY     eye lift  . IR CT HEAD LTD  01/27/2020  . IR PERCUTANEOUS ART THROMBECTOMY/INFUSION INTRACRANIAL INC DIAG ANGIO  01/27/2020      . IR PERCUTANEOUS ART THROMBECTOMY/INFUSION INTRACRANIAL INC DIAG ANGIO  01/27/2020  .  RADIOLOGY WITH ANESTHESIA N/A 01/27/2020   Procedure: IR WITH ANESTHESIA;  Surgeon: Radiologist, Medication, MD;  Location: [facility];  Service: Radiology;  Laterality: N/A;  . TEE WITHOUT CARDIOVERSION N/A 03/11/2020   Procedure: TRANSESOPHAGEAL ECHOCARDIOGRAM (TEE);  Surgeon: [name], MD;  Location: [facility] [place];  Service: Cardiovascular;  Laterality: N/A;  . TONSILLECTOMY    . WISDOM TOOTH EXTRACTION      There were no vitals filed for this visit.   Subjective Assessment - 04/04/20 0854    Subjective "It's going better and better":    Patient is accompained by: Family member   s/o - [name]   Currently in Pain? No/denies                 ADULT SLP TREATMENT - 04/04/20 0856      General Information   Behavior/Cognition Alert;Cooperative;Pleasant mood      Treatment Provided   Treatment provided Cognitive-Linquistic      Cognitive-Linquistic Treatment   Treatment focused on Aphasia;Apraxia;Patient/family/caregiver education    Skilled Treatment Targeted word finding and written expression generating and writing sailing terms. Pt generated 12 words re: his sailing. We generated 5 sentences re: sailing. Jim repeated the sentences with fading cues for  apraxia with 80% accuracy and intelligible approximations of words. Jim required frequent mod A to ID and correct written errors. S/o states "I don't let him point, I make him say the word." Educated them that this is best early during the day,  but may result in frustration as [name] fatigues.       Assessment / Recommendations / Plan   Plan Continue with current plan of care      Progression Toward Goals   Progression toward goals Progressing toward goals              SLP Short Term Goals - 04/04/20 1221      SLP SHORT TERM GOAL #1   Title Pt will name family, friends and art items with occasional min A 8/10 with approximations allowed.    Time 5    Period Weeks    Status On-going      SLP SHORT TERM GOAL #2   Title Pt  will name 7 items in personally relevant categories with occasional min A over 2 sessions    Time 5    Period Weeks    Status On-going      SLP SHORT TERM GOAL #3   Title Pt will perform 4 automatic speech tasks with rare min A.    Time 5    Period Weeks    Status On-going      SLP SHORT TERM GOAL #4   Title Pt will write personal information (name/address/phone), 5 family/friend/pet names and 7  professional words with occasional min A    Time 6    Period Weeks    Status On-going      SLP SHORT TERM GOAL #5   Title Pt will use mulitmodal communication (gesture, draw, write 1st letter etc) to augment verbal expression to meet needs at home with rare min A from family.    Time 5    Period Weeks    Status On-going            SLP Long Term Goals - 04/04/20 1222      SLP LONG TERM GOAL #1   Title Pt will verbalize 3 words to explain or describe his artwork and hobbies with occasional min A over 3 sessions, allowing for intellgible approximations    Time 11    Period Weeks    Status On-going      SLP LONG TERM GOAL #2   Title Pt and family will utilize multimodal compensations for aphasia and verbal apraxia to participate in 3 turns each of conversation with occasional min A over 2 sessions    Time 11    Period Weeks    Status On-going      SLP LONG TERM GOAL #3   Title Pt will write 3 word phrases with less than 2 errors with rare min A over 3 sessions    Time 11    Period Weeks    Status On-going      SLP LONG TERM GOAL #4   Title Reading assessment if indicated    Time 6    Period Weeks    Status On-going            Plan - 04/04/20 1221    Clinical Impression Statement Pt presents today with cont'd mod Broca's type aphasia and mod-severe verbal apraxia. SLP See "skilled intervention" for more details. I recommend skilled ST to maximize communication for QOL, safety to reduce family burden.    Speech Therapy Frequency 2x / week    Duration --   12 weeks or 25  visits   Treatment/Interventions Environmental controls;Cueing hierarchy;SLP instruction and feedback;Compensatory strategies;Functional tasks;Cognitive reorganization;Compensatory techniques;Internal/external aids;Multimodal communcation approach;Patient/family education;Language facilitation    Potential Considerations Severity of impairments  Patient will benefit from skilled therapeutic intervention in order to improve the following deficits and impairments:   Aphasia  Verbal apraxia    Problem List Patient Active Problem List   Diagnosis Date Noted  . Neurogenic bladder 03/17/2020  . Sepsis due to pneumonia ([place]) 03/06/2020  . History of stroke with residual deficit 03/06/2020  . Transient hypotension 03/06/2020  . Spastic hemiplegia affecting nondominant side ([place])   . History of hypertension   . Expressive aphasia   . AKI (acute kidney injury) ([place])   . Global aphasia   . Hypoalbuminemia due to protein-calorie malnutrition ([place])   . Dysphagia, post-stroke   . Hyperlipidemia 02/02/2020  . Dysphagia following cerebral infarction 02/02/2020  . Aspiration pneumonia ([place]) 02/02/2020  . Acute ischemic left MCA stroke ([place]) 02/02/2020  . Acute respiratory failure ([place])   . Acute ischemic stroke [facility]) s/p clot retrieval L MCA & ACA A3 01/27/2020  . Aspiration into airway 01/27/2020  . Chronic anticoagulation 01/27/2020  . Paroxysmal atrial fibrillation ([place]) 06/15/2018  . Essential hypertension 06/15/2018  . NICM (nonischemic cardiomyopathy) ([place]) 06/15/2018  . Visit for monitoring Tikosyn therapy 06/12/2018    Kasee Hantz, Annye Rusk MS, CCC-SLP 04/04/2020, 12:24 PM  [facility] [address] [place], [place], [address] Phone: [phone]   Fax:  [phone]   Name: LEO FRAY MRN: [id number] Date of Birth: 03/04/1956

## 2020-04-04 NOTE — Patient Instructions (Addendum)
     ELLIE  CAPE DOREY  MINI SKIRT  TILLER  SAIL  ORIENTAL   MOORING  NEUSE RIVER  GYBE  JIB  OARS  I MAN THE TILLER  I SAIL ON THE NEUSE RIVER  I SAIL FROM ORIENTAL  WE DROP ANCHOR AT [place] WITH MIKE

## 2020-04-04 NOTE — Therapy (Signed)
[facility] [address] [place], [place], [address] Phone: [phone]   Fax:  [phone]  Physical Therapy Treatment  Patient Details  Name: Dustin Schneider MRN: [id number] Date of Birth: August 03, 1956 Referring Provider (PT): Lauraine Rinne, PA-C (will be followed by Dr. Posey Pronto)   Encounter Date: 04/04/2020   PT End of Session - 04/04/20 1225    Visit Number 7    Number of Visits 25    Date for PT Re-Evaluation 06/14/20    Authorization Type BCBS    PT Start Time 0932    PT Stop Time 1016    PT Time Calculation (min) 44 min    Equipment Utilized During Treatment Gait belt    Activity Tolerance Patient tolerated treatment well    Behavior During Therapy [facility] for tasks assessed/performed           Past Medical History:  Diagnosis Date  . Hypertension   . NICM (nonischemic cardiomyopathy) ([place]) 06/15/2018   04/23/18: EF 45%, 09/15/18: NOrmal LVEF  . Paroxysmal atrial fibrillation ([place]) 06/15/2018  . Stroke ([place])   . Visit for monitoring Tikosyn therapy 06/12/2018    Past Surgical History:  Procedure Laterality Date  . BUBBLE STUDY  03/11/2020   Procedure: BUBBLE STUDY;  Surgeon: [name], MD;  Location: [facility];  Service: Cardiovascular;;  . CARDIOVERSION N/A 05/13/2018   Procedure: CARDIOVERSION;  Surgeon: [name], MD;  Location: [facility] ENDOSCOPY;  Service: Cardiovascular;  Laterality: N/A;  . CARDIOVERSION N/A 06/13/2018   Procedure: CARDIOVERSION;  Surgeon: [name], MD;  Location: [facility] ENDOSCOPY;  Service: Cardiovascular;  Laterality: N/A;  . CARDIOVERSION N/A 06/23/2018   Procedure: CARDIOVERSION;  Surgeon: [name], MD;  Location: [facility] ENDOSCOPY;  Service: Cardiovascular;  Laterality: N/A;  . CARDIOVERSION N/A 03/11/2020   Procedure: CARDIOVERSION;  Surgeon: [name], MD;  Location: [place] [facility];  Service: Cardiovascular;  Laterality: N/A;  . excision of actinic keratosis    . EYE SURGERY      eye lift  . IR CT HEAD LTD  01/27/2020  . IR PERCUTANEOUS ART THROMBECTOMY/INFUSION INTRACRANIAL INC DIAG ANGIO  01/27/2020      . IR PERCUTANEOUS ART THROMBECTOMY/INFUSION INTRACRANIAL INC DIAG ANGIO  01/27/2020  . RADIOLOGY WITH ANESTHESIA N/A 01/27/2020   Procedure: IR WITH ANESTHESIA;  Surgeon: Radiologist, Medication, MD;  Location: [facility];  Service: Radiology;  Laterality: N/A;  . TEE WITHOUT CARDIOVERSION N/A 03/11/2020   Procedure: TRANSESOPHAGEAL ECHOCARDIOGRAM (TEE);  Surgeon: [name], MD;  Location: [facility];  Service: Cardiovascular;  Laterality: N/A;  . TONSILLECTOMY    . WISDOM TOOTH EXTRACTION      There were no vitals filed for this visit.   Subjective Assessment - 04/04/20 0939    Subjective No falls over the weekend.    Patient is accompained by: Family member   [name], Brother in Sports coach   Pertinent History PMH: HTN, paroxysmal a fib    Limitations Standing;Walking    Patient Stated Goals "wants everything working on R side again"    Currently in Pain? No/denies                             [facility] Adult PT Treatment/Exercise - 04/04/20 0001      Transfers   Transfers Sit to Stand;Stand to Sit;Stand Pivot Transfers    Sit to Stand 4: Min guard;4: Min assist;With upper extremity assist;From bed;From chair/3-in-1    Sit to Stand  Details Verbal cues for technique;Verbal cues for safe use of DME/AE;Manual facilitation for weight shifting    Sit to Stand Details (indicate cue type and reason) cues to push off with LUE from mat table, therapist placing RLE in proper alignment prior o standing     Stand to Sit 4: Min guard;4: Min assist;With upper extremity assist;To chair/3-in-1;To bed;Uncontrolled descent    Stand to Sit Details (indicate cue type and reason) Verbal cues for sequencing;Verbal cues for safe use of DME/AE;Manual facilitation for weight shifting;Manual facilitation for placement    Stand to Sit Details cues to attend to RUE and remove from  hand orthosis prior to sitting and reaching back    Stand Pivot Transfers 4: Min assist    Stand Pivot Transfer Details (indicate cue type and reason) PT blocking R knee and making sure R ankle stays in proper positioning, from w/c <> mat table, cues for weight shift      Ambulation/Gait   Ambulation/Gait Yes    Ambulation/Gait Assistance 4: Min assist    Ambulation/Gait Assistance Details used anterior ottobock with t-strap to assist with ankle stability to RLE with all gait and R hand orthosis on RW. PT next to pt on a stool for proper foot placement, hip/knee flexion and tactile/verbal cues at pt's R knee in stance for R quad activation. second person for safety/balance with gait. Cues needed for weight shifting over RLE in stance and posture for improved step length with LLE. Continued to use a blue shoe cover over R foot with pt demonstrating improved foot clearance during swing phase. Cues to slow down at times for proper foot placement     Ambulation Distance (Feet) 230 Feet   x1   Assistive device Rolling walker    Gait Pattern Step-to pattern;Decreased stride length;Decreased stance time - right;Decreased step length - right;Decreased hip/knee flexion - right;Decreased weight shift to right;Right steppage;Lateral hip instability;Decreased trunk rotation;Narrow base of support;Poor foot clearance - right;Step-through pattern    Ambulation Surface Level;Indoor      Neuro Re-ed    Neuro Re-ed Details  From elevated mat table: sit to stands with 2" block under LLE for incr weight bearing/weight shifting towards RLE x10 with min A and cues to shift towards R prior to sitting with eccentric control (without use of reaching back with LUE). With mirror for visual cue and standing at RW with RUE in hand orthosis: 2 x 10 reps weight shifting to R with focus on posture and R quad activation; stepping forwards and back with LLE 2 x 10 reps; gentle SLS taps with LLE to 2" block 2 x 10 reps, min A for  balance and weight shift, pt with one episode of R knee almost buckling with SLS taps                     PT Short Term Goals - 03/16/20 1136      PT SHORT TERM GOAL #1   Title Pt and pt's family members will be independent with initial HEP in order to build upon functional gains made in therapy. ALL LTGS DUE 04/13/20    Time 4    Period Weeks    Status New    Target Date 04/13/20      PT SHORT TERM GOAL #2   Title Pt will perform sit > supine with supervision and supine > sit with min guard for bed mobility in order to decr caregiver burden.    Time 4  Period Weeks    Status New      PT SHORT TERM GOAL #3   Title Pt will undergo assessment of AFO for functional transfers and gait.    Time 4    Period Weeks    Status New      PT SHORT TERM GOAL #4   Title Pt will ambulate 70' with RW with mod A x1 in order to improve functional mobility.    Time 4    Period Weeks    Status New      PT SHORT TERM GOAL #5   Title Pt will perform sit <> stand with RW vs. Stedy with min A in order to improve functional BLE strength for transfers.    Time 4    Period Weeks    Status New      Additional Short Term Goals   Additional Short Term Goals Yes      PT SHORT TERM GOAL #6   Title Pt will perform squat pivot transfers from w/c <> mat table with min guard.    Baseline min A for blocking R knee    Time 4    Period Weeks    Status New             PT Long Term Goals - 03/16/20 1139      PT LONG TERM GOAL #1   Title Pt and pt's family members will be independent with final HEP in order to build upon functional gains made in therapy. ALL LTGS DUE 06/08/20    Time 12    Period Weeks    Status New    Target Date 06/08/20      PT LONG TERM GOAL #2   Title Pt will ambulate 115' with min guard/min A with RW and R AFO in order to improve household mobility.    Time 12    Period Weeks    Status New      PT LONG TERM GOAL #3   Title Pt will perform sit <> stand with RW  with min guard in order to improve functional BLE strength for transfers.    Time 12    Period Weeks    Status New      PT LONG TERM GOAL #4   Title Pt will perform squat pivot transfer with mod I vs. stand pivot transfer with RW with min guard in order to decr caregiver burden.    Time 12    Period Weeks    Status New                 Plan - 04/04/20 1430    Clinical Impression Statement Orthotist scheduled for today's appointment, but did not show for assessment of AFO - therapist to call and reschedule. Today's skilled session focused on gait training with RW and R hand orthosis and standing weight shifting/strengthening with BUE support at RW. Pt able to incr gait distance today to 230' before needing a rest break. Therapist continues to provide min A at R foot for proper foot placement and 2nd hand for help for balance. Pt is progressing well, will continue to progress towards LTGs.    Personal Factors and Comorbidities Comorbidity 3+    Comorbidities dense L MCA CVA, paroxysmal A. fib on chronic Xarelto, nonischemic cardiomyopathy and hypertension.    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, works as an Gaffer  Stability/Clinical Decision Making Evolving/Moderate complexity    Rehab Potential Good    PT Frequency 2x / week    PT Duration 12 weeks    PT Treatment/Interventions ADLs/Self Care Home Management;Aquatic Therapy;DME Instruction;Gait training;Stair training;Functional mobility training;Therapeutic activities;Therapeutic exercise;Electrical Stimulation;Balance training;Neuromuscular re-education;Wheelchair mobility training;Orthotic Fit/Training;Patient/family education;Passive range of motion;Energy conservation    PT Next Visit Plan trying to reschedule a time for [name] to come  again. continue gait training with brace, t-strap and hand orthotic; bioness to RLE (ant tib and quad) for strengthening/muscle re-ed, weight shifting/NMR to RLE. how is HEP going? make any necessary adjustments.    Consulted and Agree with Plan of Care Patient;Family member/caregiver    Family Member Consulted gf [name]           Patient will benefit from skilled therapeutic intervention in order to improve the following deficits and impairments:  Abnormal gait, Decreased activity tolerance, Decreased balance, Decreased cognition, Decreased mobility, Decreased coordination, Decreased range of motion, Decreased endurance, Decreased strength, Hypomobility, Difficulty walking, Impaired UE functional use, Impaired vision/preception, Postural dysfunction, Impaired sensation  Visit Diagnosis: Other lack of coordination  Hemiplegia and hemiparesis following cerebral infarction affecting right dominant side (HCC)  Other abnormalities of gait and mobility  Unsteadiness on feet  Muscle weakness (generalized)     Problem List Patient Active Problem List   Diagnosis Date Noted  . Neurogenic bladder 03/17/2020  . Sepsis due to pneumonia ([place]) 03/06/2020  . History of stroke with residual deficit 03/06/2020  . Transient hypotension 03/06/2020  . Spastic hemiplegia affecting nondominant side ([place])   . History of hypertension   . Expressive aphasia   . AKI (acute kidney injury) ([place])   . Global aphasia   . Hypoalbuminemia due to protein-calorie malnutrition ([place])   . Dysphagia, post-stroke   . Hyperlipidemia 02/02/2020  . Dysphagia following cerebral infarction 02/02/2020  . Aspiration pneumonia ([place]) 02/02/2020  . Acute ischemic left MCA stroke ([place]) 02/02/2020  . Acute respiratory failure ([place])   . Acute ischemic stroke [facility]) s/p clot retrieval L MCA & ACA A3 01/27/2020  . Aspiration into airway 01/27/2020  . Chronic anticoagulation 01/27/2020  . Paroxysmal atrial fibrillation ([place])  06/15/2018  . Essential hypertension 06/15/2018  . NICM (nonischemic cardiomyopathy) ([place]) 06/15/2018  . Visit for monitoring Tikosyn therapy 06/12/2018    Arliss Journey, PT, DPT  04/04/2020, 2:35 PM  [facility] [address] [place], [place], [address] Phone: [phone]   Fax:  [phone]  Name: Dustin Schneider MRN: [id number] Date of Birth: 09/13/1955

## 2020-04-05 ENCOUNTER — Ambulatory Visit (INDEPENDENT_AMBULATORY_CARE_PROVIDER_SITE_OTHER): Payer: BC Managed Care – PPO | Admitting: Family

## 2020-04-05 ENCOUNTER — Other Ambulatory Visit: Payer: Self-pay | Admitting: Physical Medicine and Rehabilitation

## 2020-04-05 VITALS — BP 112/78 | HR 69 | Temp 98.2°F

## 2020-04-05 DIAGNOSIS — L989 Disorder of the skin and subcutaneous tissue, unspecified: Secondary | ICD-10-CM

## 2020-04-05 DIAGNOSIS — I10 Essential (primary) hypertension: Secondary | ICD-10-CM | POA: Diagnosis not present

## 2020-04-05 DIAGNOSIS — R4701 Aphasia: Secondary | ICD-10-CM

## 2020-04-05 DIAGNOSIS — I6930 Unspecified sequelae of cerebral infarction: Secondary | ICD-10-CM

## 2020-04-05 DIAGNOSIS — I480 Paroxysmal atrial fibrillation: Secondary | ICD-10-CM | POA: Diagnosis not present

## 2020-04-05 NOTE — Progress Notes (Signed)
Dustin Schneider is a 64 y.o. male with the following history as recorded in EpicCare:  Patient Active Problem List   Diagnosis Date Noted  . Neurogenic bladder 03/17/2020  . Sepsis due to pneumonia ([place]) 03/06/2020  . History of stroke with residual deficit 03/06/2020  . Transient hypotension 03/06/2020  . Spastic hemiplegia affecting nondominant side ([place])   . History of hypertension   . Expressive aphasia   . AKI (acute kidney injury) ([place])   . Global aphasia   . Hypoalbuminemia due to protein-calorie malnutrition ([place])   . Dysphagia, post-stroke   . Hyperlipidemia 02/02/2020  . Dysphagia following cerebral infarction 02/02/2020  . Aspiration pneumonia ([place]) 02/02/2020  . Acute ischemic left MCA stroke ([place]) 02/02/2020  . Acute respiratory failure ([place])   . Acute ischemic stroke [facility]) s/p clot retrieval L MCA & ACA A3 01/27/2020  . Aspiration into airway 01/27/2020  . Chronic anticoagulation 01/27/2020  . Paroxysmal atrial fibrillation ([place]) 06/15/2018  . Essential hypertension 06/15/2018  . NICM (nonischemic cardiomyopathy) ([place]) 06/15/2018  . Visit for monitoring Tikosyn therapy 06/12/2018    Current Outpatient Medications  Medication Sig Dispense Refill  . acetaminophen (TYLENOL) 325 MG tablet Take 1-2 tablets (325-650 mg total) by mouth every 4 (four) hours as needed for mild pain.    Marland Kitchen apixaban (ELIQUIS) 5 MG TABS tablet Take 1 tablet (5 mg total) by mouth 2 (two) times daily. 60 tablet 3  . bethanechol (URECHOLINE) 25 MG tablet Take 0.5 tablets (12.5 mg total) by mouth 3 (three) times daily. 45 tablet 1  . diltiazem (CARDIZEM CD) 180 MG 24 hr capsule Take 1 capsule (180 mg total) by mouth daily. 30 capsule 3  . dofetilide (TIKOSYN) 500 MCG capsule Take 1 capsule (500 mcg total) by mouth 2 (two) times daily. 180 capsule 0  . metoprolol tartrate (LOPRESSOR) 50 MG tablet Take 1 tablet (50 mg total) by mouth 2 (two) times daily. 60 tablet 2  . rosuvastatin (CRESTOR) 10 MG  tablet Take 1 tablet (10 mg total) by mouth daily after supper. 30 tablet 3  . tamsulosin (FLOMAX) 0.4 MG CAPS capsule Take 1 capsule (0.4 mg total) by mouth daily at 8 pm. 30 capsule 3  . tiZANidine (ZANAFLEX) 2 MG tablet Take 1 tablet (2 mg total) by mouth 3 (three) times daily. 90 tablet 0   No current facility-administered medications for this visit.    Allergies: Patient has no known allergies.  Past Medical History:  Diagnosis Date  . Hypertension   . NICM (nonischemic cardiomyopathy) ([place]) 06/15/2018   04/23/18: EF 45%, 09/15/18: NOrmal LVEF  . Paroxysmal atrial fibrillation ([place]) 06/15/2018  . Stroke ([place])   . Visit for monitoring Tikosyn therapy 06/12/2018    Past Surgical History:  Procedure Laterality Date  . BUBBLE STUDY  03/11/2020   Procedure: BUBBLE STUDY;  Surgeon: [name], MD;  Location: [place];  Service: Cardiovascular;;  . CARDIOVERSION N/A 05/13/2018   Procedure: CARDIOVERSION;  Surgeon: [name], MD;  Location: [facility] ENDOSCOPY;  Service: Cardiovascular;  Laterality: N/A;  . CARDIOVERSION N/A 06/13/2018   Procedure: CARDIOVERSION;  Surgeon: [name], MD;  Location: [facility] ENDOSCOPY;  Service: Cardiovascular;  Laterality: N/A;  . CARDIOVERSION N/A 06/23/2018   Procedure: CARDIOVERSION;  Surgeon: [name], MD;  Location: [facility] ENDOSCOPY;  Service: Cardiovascular;  Laterality: N/A;  . CARDIOVERSION N/A 03/11/2020   Procedure: CARDIOVERSION;  Surgeon: [name], MD;  Location: [place];  Service: Cardiovascular;  Laterality: N/A;  . excision of  actinic keratosis    . EYE SURGERY     eye lift  . IR CT HEAD LTD  01/27/2020  . IR PERCUTANEOUS ART THROMBECTOMY/INFUSION INTRACRANIAL INC DIAG ANGIO  01/27/2020      . IR PERCUTANEOUS ART THROMBECTOMY/INFUSION INTRACRANIAL INC DIAG ANGIO  01/27/2020  . RADIOLOGY WITH ANESTHESIA N/A 01/27/2020   Procedure: IR WITH ANESTHESIA;  Surgeon: Radiologist, Medication, MD;  Location: [place];  Service: Radiology;  Laterality: N/A;   . TEE WITHOUT CARDIOVERSION N/A 03/11/2020   Procedure: TRANSESOPHAGEAL ECHOCARDIOGRAM (TEE);  Surgeon: [name], MD;  Location: [place];  Service: Cardiovascular;  Laterality: N/A;  . TONSILLECTOMY    . WISDOM TOOTH EXTRACTION      Family History  Problem Relation Age of Onset  . Glaucoma Mother   . Atrial fibrillation Father     Social History   Tobacco Use  . Smoking status: Never Smoker  . Smokeless tobacco: Never Used  Substance Use Topics  . Alcohol use: Not Currently    Subjective:  Presents today as a new patient; accompanied by family member; History of A. Fib diagnosed in 2019; readily admits was not taking his medications; had ischemic stroke at end of July; working with cardiology/ neurology/ rehab provider at this time;  Wanted to get established with PCP for future care needs; no acute concerns today;      Objective:  Vitals:   04/05/20 1040  BP: 112/78  Pulse: 69  Temp: 98.2 F (36.8 C)  TempSrc: Oral  SpO2: 96%    General: Well developed, well nourished, in no acute distress  Skin : Warm and dry.  Head: Normocephalic and atraumatic  Eyes: Sclera and conjunctiva clear; pupils round and reactive to light; extraocular movements intact  Ears: External normal; canals clear; tympanic membranes normal  Oropharynx: Pink, supple. No suspicious lesions  Neck: Supple without thyromegaly, adenopathy  Lungs: Respirations unlabored; clear to auscultation bilaterally without wheeze, rales, rhonchi  CVS exam: normal rate and regular rhythm.  Musculoskeletal: No deformities; no active joint inflammation  Extremities: No edema, cyanosis, clubbing  Vessels: Symmetric bilaterally  Neurologic: Alert and oriented; speech not intact; face symmetrical; in wheelchair  Assessment:  1. History of stroke with residual deficit   2. Essential hypertension   3. Global aphasia   4. Paroxysmal atrial fibrillation (HCC)   5. Skin lesion     Plan:  Continue with  specialists as already scheduled; plan for flu shot and COVID booster later this fall; Refer to dermatology per patient request for "head to toe" skin check;  Follow-up in 4- 6 months, sooner prn.   This visit occurred during the SARS-CoV-2 public health emergency.  Safety protocols were in place, including screening questions prior to the visit, additional usage of staff PPE, and extensive cleaning of exam room while observing appropriate contact time as indicated for disinfecting solutions.     No follow-ups on file.  Orders Placed This Encounter  Procedures  . Ambulatory referral to Dermatology    Referral Priority:   Routine    Referral Type:   Consultation    Referral Reason:   Specialty Services Required    Requested Specialty:   Dermatology    Number of Visits Requested:   1    Requested Prescriptions    No prescriptions requested or ordered in this encounter

## 2020-04-08 ENCOUNTER — Other Ambulatory Visit: Payer: Self-pay

## 2020-04-08 ENCOUNTER — Telehealth: Payer: Self-pay | Admitting: Physical Therapy

## 2020-04-08 ENCOUNTER — Ambulatory Visit: Payer: BC Managed Care – PPO

## 2020-04-08 ENCOUNTER — Ambulatory Visit: Payer: BC Managed Care – PPO | Admitting: Physical Therapy

## 2020-04-08 DIAGNOSIS — M6281 Muscle weakness (generalized): Secondary | ICD-10-CM

## 2020-04-08 DIAGNOSIS — I69351 Hemiplegia and hemiparesis following cerebral infarction affecting right dominant side: Secondary | ICD-10-CM

## 2020-04-08 DIAGNOSIS — R2681 Unsteadiness on feet: Secondary | ICD-10-CM

## 2020-04-08 DIAGNOSIS — R4701 Aphasia: Secondary | ICD-10-CM

## 2020-04-08 DIAGNOSIS — R278 Other lack of coordination: Secondary | ICD-10-CM

## 2020-04-08 DIAGNOSIS — R262 Difficulty in walking, not elsewhere classified: Secondary | ICD-10-CM

## 2020-04-08 DIAGNOSIS — R482 Apraxia: Secondary | ICD-10-CM

## 2020-04-08 DIAGNOSIS — R2689 Other abnormalities of gait and mobility: Secondary | ICD-10-CM

## 2020-04-08 NOTE — Telephone Encounter (Signed)
[name],   Mr. Luka Stohr has been seen by PT at [facility]. Pt is making continued improvements with his gait and would benefit from an order for a  right AFO due to weakness, impaired tone, sensation and balance. And if possible, would you be able to addend your last note when you saw him on 03/30/20 to include that pt would benefit from use of a R AFO? Please let me know if you have any questions.   If you agree, please place an order in [facility] workque in [facility] or fax the order to [phone].  Thanks so much, [name], PT, DPT 04/08/20 11:50 AM   [facility] [address] [place], [place]  [address] Phone:  [phone] Fax:  [phone]

## 2020-04-08 NOTE — Therapy (Signed)
[facility] [address] [place], [place], [address] Phone: [phone]   Fax:  [phone]  Speech Language Pathology Treatment  Patient Details  Name: Dustin Schneider MRN: [id number] Date of Birth: 1956/07/27 Referring Provider (SLP): Dr. Delice Lesch   Encounter Date: 04/08/2020   End of Session - 04/08/20 1749    Visit Number 5    Number of Visits 25    Date for SLP Re-Evaluation 06/15/20    SLP Start Time 0935    SLP Stop Time  5284    SLP Time Calculation (min) 40 min    Activity Tolerance Patient tolerated treatment well           Past Medical History:  Diagnosis Date  . Hypertension   . NICM (nonischemic cardiomyopathy) ([place]) 06/15/2018   04/23/18: EF 45%, 09/15/18: NOrmal LVEF  . Paroxysmal atrial fibrillation ([place]) 06/15/2018  . Stroke ([place])   . Visit for monitoring Tikosyn therapy 06/12/2018    Past Surgical History:  Procedure Laterality Date  . BUBBLE STUDY  03/11/2020   Procedure: BUBBLE STUDY;  Surgeon: [name], MD;  Location: [place];  Service: Cardiovascular;;  . CARDIOVERSION N/A 05/13/2018   Procedure: CARDIOVERSION;  Surgeon: [name], MD;  Location[facility]OSCOPY;  Service: Cardiovascular;  Laterality: N/A;  . CARDIOVERSION N/A 06/13/2018   Procedure: CARDIOVERSION;  Surgeon: [name], MD;  Location: [name] ENDOSCOPY;  Service: Cardiovascular;  Laterality: N/A;  . CARDIOVERSION N/A 06/23/2018   Procedure: CARDIOVERSION;  Surgeon: [name], MD;  Location: [facility] ENDOSCOPY;  Service: Cardiovascular;  Laterality: N/A;  . CARDIOVERSION N/A 03/11/2020   Procedure: CARDIOVERSION;  Surgeon: [name], MD;  Location: [place];  Service: Cardiovascular;  Laterality: N/A;  . excision of actinic keratosis    . EYE SURGERY     eye lift  . IR CT HEAD LTD  01/27/2020  . IR PERCUTANEOUS ART THROMBECTOMY/INFUSION INTRACRANIAL INC DIAG ANGIO  01/27/2020      . IR PERCUTANEOUS ART  THROMBECTOMY/INFUSION INTRACRANIAL INC DIAG ANGIO  01/27/2020  . RADIOLOGY WITH ANESTHESIA N/A 01/27/2020   Procedure: IR WITH ANESTHESIA;  Surgeon: Radiologist, Medication, MD;  Location: [place];  Service: Radiology;  Laterality: N/A;  . TEE WITHOUT CARDIOVERSION N/A 03/11/2020   Procedure: TRANSESOPHAGEAL ECHOCARDIOGRAM (TEE);  Surgeon: [name], MD;  Location: [place];  Service: Cardiovascular;  Laterality: N/A;  . TONSILLECTOMY    . WISDOM TOOTH EXTRACTION      There were no vitals filed for this visit.   Subjective Assessment - 04/08/20 1324    Subjective "Yah-sherr" ("badgers")    Patient is accompained by: Family member   [name]- BIL   Currently in Pain? No/denies                 ADULT SLP TREATMENT - 04/08/20 0955      General Information   Behavior/Cognition Alert;Cooperative;Pleasant mood      Treatment Provided   Treatment provided Cognitive-Linquistic      Cognitive-Linquistic Treatment   Treatment focused on Apraxia;Aphasia    Skilled Treatment BIL did not bring sailing sentences nor pt's art tools. Targeted word finding and verbal expression in salient topics for pt - sailing and art. SLP used maps app and written cues. Pt with much groping today for correct articuation for one-two word responses. WRitten cues did not appear to assist. SLP used tactile cues for /d/ and some non-speech cues which DID assist pt (singing).  Assessment / Recommendations / Plan   Plan Continue with current plan of care      Progression Toward Goals   Progression toward goals Progressing toward goals              SLP Short Term Goals - 04/08/20 1749      SLP SHORT TERM GOAL #1   Title Pt will name family, friends and art items with occasional min A 8/10 with approximations allowed.    Time 5    Period Weeks    Status On-going      SLP SHORT TERM GOAL #2   Title Pt will name 7 items in personally relevant categories with occasional min A over 2 sessions     Time 5    Period Weeks    Status On-going      SLP SHORT TERM GOAL #3   Title Pt will perform 4 automatic speech tasks with rare min A.    Time 5    Period Weeks    Status On-going      SLP SHORT TERM GOAL #4   Title Pt will write personal information (name/address/phone), 5 family/friend/pet names and 7  professional words with occasional min A    Time 6    Period Weeks    Status On-going      SLP SHORT TERM GOAL #5   Title Pt will use mulitmodal communication (gesture, draw, write 1st letter etc) to augment verbal expression to meet needs at home with rare min A from family.    Time 5    Period Weeks    Status On-going            SLP Long Term Goals - 04/08/20 1749      SLP LONG TERM GOAL #1   Title Pt will verbalize 3 words to explain or describe his artwork and hobbies with occasional min A over 3 sessions, allowing for intellgible approximations    Time 11    Period Weeks    Status On-going      SLP LONG TERM GOAL #2   Title Pt and family will utilize multimodal compensations for aphasia and verbal apraxia to participate in 3 turns each of conversation with occasional min A over 2 sessions    Time 11    Period Weeks    Status On-going      SLP LONG TERM GOAL #3   Title Pt will write 3 word phrases with less than 2 errors with rare min A over 3 sessions    Time 11    Period Weeks    Status On-going      SLP LONG TERM GOAL #4   Title Reading assessment if indicated    Time 6    Period Weeks    Status On-going            Plan - 04/08/20 1749    Clinical Impression Statement Pt presents today with cont'd mod Broca's type aphasia and mod-severe verbal apraxia. SLP See "skilled intervention" for more details. I recommend skilled ST to maximize communication for QOL, safety to reduce family burden.    Speech Therapy Frequency 2x / week    Duration --   12 weeks or 25 visits   Treatment/Interventions Environmental controls;Cueing hierarchy;SLP instruction and  feedback;Compensatory strategies;Functional tasks;Cognitive reorganization;Compensatory techniques;Internal/external aids;Multimodal communcation approach;Patient/family education;Language facilitation    Potential Considerations Severity of impairments           Patient will benefit from skilled therapeutic  intervention in order to improve the following deficits and impairments:   Verbal apraxia  Aphasia    Problem List Patient Active Problem List   Diagnosis Date Noted  . Neurogenic bladder 03/17/2020  . Sepsis due to pneumonia ([place]) 03/06/2020  . History of stroke with residual deficit 03/06/2020  . Transient hypotension 03/06/2020  . Spastic hemiplegia affecting nondominant side ([place])   . History of hypertension   . Expressive aphasia   . AKI (acute kidney injury) ([place])   . Global aphasia   . Hypoalbuminemia due to protein-calorie malnutrition ([place])   . Dysphagia, post-stroke   . Hyperlipidemia 02/02/2020  . Dysphagia following cerebral infarction 02/02/2020  . Aspiration pneumonia ([place]) 02/02/2020  . Acute ischemic left MCA stroke ([place]) 02/02/2020  . Acute respiratory failure ([place])   . Acute ischemic stroke [facility]) s/p clot retrieval L MCA & ACA A3 01/27/2020  . Aspiration into airway 01/27/2020  . Chronic anticoagulation 01/27/2020  . Paroxysmal atrial fibrillation ([place]) 06/15/2018  . Essential hypertension 06/15/2018  . NICM (nonischemic cardiomyopathy) ([place]) 06/15/2018  . Visit for monitoring Tikosyn therapy 06/12/2018    [facility] ,[place], [place]  04/08/2020, 5:50 PM  [facility] [address] [place], [place], [address] Phone: [phone]   Fax:  [phone]   Name: Dustin Schneider MRN: [id number] Date of Birth: 1956-06-25

## 2020-04-08 NOTE — Therapy (Signed)
[facility] [address] [place], [place], [address] Phone: [phone]   Fax:  [phone]  Physical Therapy Treatment  Patient Details  Name: Dustin Schneider MRN: [id number] Date of Birth: 10/12/1955 Referring Provider (PT): Dustin Rinne, PA-C (will be followed by Dr. Posey Schneider)   Encounter Date: 04/08/2020   PT End of Session - 04/08/20 1139    Visit Number 8    Number of Visits 25    Date for PT Re-Evaluation 06/14/20    Authorization Type BCBS    PT Start Time 0849    PT Stop Time 0932    PT Time Calculation (min) 43 min    Equipment Utilized During Treatment Gait belt    Activity Tolerance Patient tolerated treatment well    Behavior During Therapy [facility] for tasks assessed/performed           Past Medical History:  Diagnosis Date   Hypertension    NICM (nonischemic cardiomyopathy) ([place]) 06/15/2018   04/23/18: EF 45%, 09/15/18: NOrmal LVEF   Paroxysmal atrial fibrillation ([place]) 06/15/2018   Stroke ([place])    Visit for monitoring Tikosyn therapy 06/12/2018    Past Surgical History:  Procedure Laterality Date   BUBBLE STUDY  03/11/2020   Procedure: BUBBLE STUDY;  Surgeon: [name], MD;  Location: [facility];  Service: Cardiovascular;;   CARDIOVERSION N/A 05/13/2018   Procedure: CARDIOVERSION;  Surgeon: [name], MD;  Location: [facility];  Service: Cardiovascular;  Laterality: N/A;   CARDIOVERSION N/A 06/13/2018   Procedure: CARDIOVERSION;  Surgeon: [name], MD;  Location: [facility] ENDOSCOPY;  Service: Cardiovascular;  Laterality: N/A;   CARDIOVERSION N/A 06/23/2018   Procedure: CARDIOVERSION;  Surgeon: [name], MD;  Location: [facility];  Service: Cardiovascular;  Laterality: N/A;   CARDIOVERSION N/A 03/11/2020   Procedure: CARDIOVERSION;  Surgeon: [name], MD;  Location: [facility];  Service: Cardiovascular;  Laterality: N/A;   excision of actinic keratosis     EYE SURGERY      eye lift   IR CT HEAD LTD  01/27/2020   IR PERCUTANEOUS ART THROMBECTOMY/INFUSION INTRACRANIAL INC DIAG ANGIO  01/27/2020       IR PERCUTANEOUS ART THROMBECTOMY/INFUSION INTRACRANIAL INC DIAG ANGIO  01/27/2020   RADIOLOGY WITH ANESTHESIA N/A 01/27/2020   Procedure: IR WITH ANESTHESIA;  Surgeon: Radiologist, Medication, MD;  Location: [facility];  Service: Radiology;  Laterality: N/A;   TEE WITHOUT CARDIOVERSION N/A 03/11/2020   Procedure: TRANSESOPHAGEAL ECHOCARDIOGRAM (TEE);  Surgeon: [name], MD;  Location: [facility];  Service: Cardiovascular;  Laterality: N/A;   TONSILLECTOMY     WISDOM TOOTH EXTRACTION      There were no vitals filed for this visit.   Subjective Assessment - 04/08/20 0852    Subjective No changes, no falls. Nothing new.    Patient is accompained by: Family member   Dustin Schneider, Brother in Sports coach   Pertinent History PMH: HTN, paroxysmal a fib    Limitations Standing;Walking    Patient Stated Goals "wants everything working on R side again"    Currently in Pain? No/denies                             [facility] Adult PT Treatment/Exercise - 04/08/20 0001      Transfers   Transfers Sit to Stand;Stand to Sit;Stand Pivot Transfers    Sit to Stand 4: Min guard;4: Min assist;With upper extremity assist;From bed;From chair/3-in-1    Sit to  Stand Details Verbal cues for technique;Verbal cues for safe use of DME/AE;Manual facilitation for weight shifting    Sit to Stand Details (indicate cue type and reason) cues to push off with LUE from mat table to safely stand, once in standing needs assist to place weight through RLE     Stand to Sit 4: Min guard;4: Min assist;With upper extremity assist;To chair/3-in-1;To bed;Uncontrolled descent    Stand to Sit Details (indicate cue type and reason) Verbal cues for sequencing;Verbal cues for safe use of DME/AE;Manual facilitation for weight shifting;Manual facilitation for placement    Stand to Sit Details cues to remove R  hand from orthosis prior to sitting     Stand Pivot Transfers 4: Min assist    Stand Pivot Transfer Details (indicate cue type and reason) PT blocking R knee and making sure R ankle stays in proper positioning, from w/c > mat table, cues for weight shift    Transfer Cueing [name] asking how they can replicate sit <> stands at home, pt does not have an AFO at home, discussed that next session can try standing at sink with RW posteriorly to make sure it would be safe for pt to do at home without a brace on      Comments from slightly elevated mat table, with 2" block placed under LLE for incr weight shift/weight bearing through RLE x10 reps sit <> stands with single UE support to stand, no hands for balance in standing (RW right there), and no UE support when descending, needing min A for balance, verbal and tactile cues for R quad activation in standing      Ambulation/Gait   Ambulation/Gait Yes    Ambulation/Gait Assistance 4: Min assist    Ambulation/Gait Assistance Details Continued to use anterior ottobock with t-strap to assist with ankle stability to RLE with all gait and R hand orthosis on RW. PT next to pt on a stool for proper foot placement, hip/knee flexion and tactile/verbal cues at pt's R knee in stance for R quad activation. second person for safety/balance with gait. Cues needed for weight shifting over RLE in stance and posture for improved step length with LLE. Continued to use a blue shoe cover over R foot with pt demonstrating improved foot clearance during swing phase. When fatigued towards end of bouts of gait pt demonstrating decr foot clearance with RLE. Cues to slow down at times for proper foot placement with RLE. Orthotist supposed to be present today to assess for AFO but did not show.     Ambulation Distance (Feet) 230 Feet   x1, 115' x 1   Assistive device Rolling walker    Gait Pattern Step-to pattern;Decreased stride length;Decreased stance time - right;Decreased step length  - right;Decreased hip/knee flexion - right;Decreased weight shift to right;Right steppage;Lateral hip instability;Decreased trunk rotation;Narrow base of support;Poor foot clearance - right;Step-through pattern    Ambulation Surface Level;Indoor      Exercises   Exercises Other Exercises    Other Exercises  2 x 10 reps AAROM R knee flexion seated (with AFO removed when performing), assist from therapist for full ROM and proper alignment and use of towel to allow foot to move easier                    PT Short Term Goals - 03/16/20 1136      PT SHORT TERM GOAL #1   Title Pt and pt's family members will be independent with initial HEP in  order to build upon functional gains made in therapy. ALL LTGS DUE 04/13/20    Time 4    Period Weeks    Status New    Target Date 04/13/20      PT SHORT TERM GOAL #2   Title Pt will perform sit > supine with supervision and supine > sit with min guard for bed mobility in order to decr caregiver burden.    Time 4    Period Weeks    Status New      PT SHORT TERM GOAL #3   Title Pt will undergo assessment of AFO for functional transfers and gait.    Time 4    Period Weeks    Status New      PT SHORT TERM GOAL #4   Title Pt will ambulate 50 with RW with mod A x1 in order to improve functional mobility.    Time 4    Period Weeks    Status New      PT SHORT TERM GOAL #5   Title Pt will perform sit <> stand with RW vs. Stedy with min A in order to improve functional BLE strength for transfers.    Time 4    Period Weeks    Status New      Additional Short Term Goals   Additional Short Term Goals Yes      PT SHORT TERM GOAL #6   Title Pt will perform squat pivot transfers from w/c <> mat table with min guard.    Baseline min A for blocking R knee    Time 4    Period Weeks    Status New             PT Long Term Goals - 03/16/20 1139      PT LONG TERM GOAL #1   Title Pt and pt's family members will be independent with final  HEP in order to build upon functional gains made in therapy. ALL LTGS DUE 06/08/20    Time 12    Period Weeks    Status New    Target Date 06/08/20      PT LONG TERM GOAL #2   Title Pt will ambulate 115' with min guard/min A with RW and R AFO in order to improve household mobility.    Time 12    Period Weeks    Status New      PT LONG TERM GOAL #3   Title Pt will perform sit <> stand with RW with min guard in order to improve functional BLE strength for transfers.    Time 12    Period Weeks    Status New      PT LONG TERM GOAL #4   Title Pt will perform squat pivot transfer with mod I vs. stand pivot transfer with RW with min guard in order to decr caregiver burden.    Time 12    Period Weeks    Status New                 Plan - 04/08/20 1141    Clinical Impression Statement Able to increase gait distance today - performed one bout of 230' and additional bout of 115' at end of session. Continues to need min A from therapist for proper R foot placement and hip/knee flexion and PT tech helping for balance. Pt demonstrates decr R foot clearance when more fatigued. Orthotist did not show for today's appointment, but will be  at pt's next appt to assess for AFO. Will continue to progress towards LTGs.    Personal Factors and Comorbidities Comorbidity 3+    Comorbidities dense L MCA CVA, paroxysmal A. fib on chronic Xarelto, nonischemic cardiomyopathy and hypertension.    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, works as an Electrical engineer Evolving/Moderate complexity    Rehab Potential Good    PT Frequency 2x / week    PT Duration 12 weeks    PT Treatment/Interventions ADLs/Self Care Home Management;Aquatic Therapy;DME Instruction;Gait training;Stair  training;Functional mobility training;Therapeutic activities;Therapeutic exercise;Electrical Stimulation;Balance training;Neuromuscular re-education;Wheelchair mobility training;Orthotic Fit/Training;Patient/family education;Passive range of motion;Energy conservation    PT Next Visit Plan STGs due 9/1. [name] will be coming to assess for AFO towards tail end (last 20 minutes of your session on tuesday), any standing exercises that pt can safely do at home at the counter (or can pt borrow our AFO for home standing use only??) continue gait training with brace, t-strap and hand orthotic; bioness to RLE (ant tib and quad) for strengthening/muscle re-ed, weight shifting/NMR to RLE.    Consulted and Agree with Plan of Care Patient;Family member/caregiver    Family Member Consulted gf [name]           Patient will benefit from skilled therapeutic intervention in order to improve the following deficits and impairments:  Abnormal gait, Decreased activity tolerance, Decreased balance, Decreased cognition, Decreased mobility, Decreased coordination, Decreased range of motion, Decreased endurance, Decreased strength, Hypomobility, Difficulty walking, Impaired UE functional use, Impaired vision/preception, Postural dysfunction, Impaired sensation  Visit Diagnosis: Other lack of coordination  Hemiplegia and hemiparesis following cerebral infarction affecting right dominant side (HCC)  Other abnormalities of gait and mobility  Unsteadiness on feet  Muscle weakness (generalized)  Difficulty in walking, not elsewhere classified     Problem List Patient Active Problem List   Diagnosis Date Noted   Neurogenic bladder 03/17/2020   Sepsis due to pneumonia ([place]) 03/06/2020   History of stroke with residual deficit 03/06/2020   Transient hypotension 03/06/2020   Spastic hemiplegia affecting nondominant side ([place])    History of hypertension    Expressive aphasia    AKI (acute kidney injury) ([place])     Global aphasia    Hypoalbuminemia due to protein-calorie malnutrition ([place])    Dysphagia, post-stroke    Hyperlipidemia 02/02/2020   Dysphagia following cerebral infarction 02/02/2020   Aspiration pneumonia ([place]) 02/02/2020   Acute ischemic left MCA stroke ([place]) 02/02/2020   Acute respiratory failure (HCC)    Acute ischemic stroke ([place]) s/p clot retrieval L MCA & ACA A3 01/27/2020   Aspiration into airway 01/27/2020   Chronic anticoagulation 01/27/2020   Paroxysmal atrial fibrillation ([place]) 06/15/2018   Essential hypertension 06/15/2018   NICM (nonischemic cardiomyopathy) ([place]) 06/15/2018   Visit for monitoring Tikosyn therapy 06/12/2018    Arliss Journey, PT, DPT  04/08/2020, 11:46 AM  [facility] [address] [place], [place], [address] Phone: [phone]   Fax:  [phone]  Name: Dustin Schneider MRN: [id number] Date of Birth: November 04, 1955

## 2020-04-11 ENCOUNTER — Telehealth: Payer: Self-pay | Admitting: Physical Therapy

## 2020-04-11 NOTE — Telephone Encounter (Addendum)
Duplicate telephone encounter entered in error. Refer to telephone encounter on 04/08/20.  [name], PT, DPT 04/11/20 6:47 PM    [facility]  [address] [place], [place]  [address] Phone:  [phone] Fax:  [phone]

## 2020-04-12 ENCOUNTER — Ambulatory Visit: Payer: BC Managed Care – PPO | Admitting: Occupational Therapy

## 2020-04-12 ENCOUNTER — Encounter: Payer: Self-pay | Admitting: Physical Therapy

## 2020-04-12 ENCOUNTER — Other Ambulatory Visit: Payer: Self-pay

## 2020-04-12 ENCOUNTER — Ambulatory Visit: Payer: BC Managed Care – PPO

## 2020-04-12 ENCOUNTER — Ambulatory Visit: Payer: BC Managed Care – PPO | Admitting: Physical Therapy

## 2020-04-12 DIAGNOSIS — R4701 Aphasia: Secondary | ICD-10-CM

## 2020-04-12 DIAGNOSIS — R278 Other lack of coordination: Secondary | ICD-10-CM

## 2020-04-12 DIAGNOSIS — R482 Apraxia: Secondary | ICD-10-CM

## 2020-04-12 DIAGNOSIS — I69351 Hemiplegia and hemiparesis following cerebral infarction affecting right dominant side: Secondary | ICD-10-CM

## 2020-04-12 DIAGNOSIS — R2681 Unsteadiness on feet: Secondary | ICD-10-CM

## 2020-04-12 DIAGNOSIS — R2689 Other abnormalities of gait and mobility: Secondary | ICD-10-CM

## 2020-04-12 DIAGNOSIS — M6281 Muscle weakness (generalized): Secondary | ICD-10-CM

## 2020-04-12 DIAGNOSIS — R262 Difficulty in walking, not elsewhere classified: Secondary | ICD-10-CM

## 2020-04-12 NOTE — Therapy (Signed)
[facility] [address] [place], [place], [address] Phone: [phone]   Fax:  [phone]  Speech Language Pathology Treatment  Patient Details  Name: Dustin Schneider MRN: [id number] Date of Birth: 12-Mar-1956 Referring Provider (SLP): Dr. Delice Lesch   Encounter Date: 04/12/2020   End of Session - 04/12/20 1314    Visit Number 6    Number of Visits 25    Date for SLP Re-Evaluation 06/15/20    SLP Start Time 0804    SLP Stop Time  0845    SLP Time Calculation (min) 41 min    Activity Tolerance Patient tolerated treatment well           Past Medical History:  Diagnosis Date   Hypertension    NICM (nonischemic cardiomyopathy) ([place]) 06/15/2018   04/23/18: EF 45%, 09/15/18: NOrmal LVEF   Paroxysmal atrial fibrillation ([place]) 06/15/2018   Stroke ([place])    Visit for monitoring Tikosyn therapy 06/12/2018    Past Surgical History:  Procedure Laterality Date   BUBBLE STUDY  03/11/2020   Procedure: BUBBLE STUDY;  Surgeon: [name], MD;  Location: [facility];  Service: Cardiovascular;;   CARDIOVERSION N/A 05/13/2018   Procedure: CARDIOVERSION;  Surgeon: [name], MD;  Location: [facility];  Service: Cardiovascular;  Laterality: N/A;   CARDIOVERSION N/A 06/13/2018   Procedure: CARDIOVERSION;  Surgeon: [name], MD;  Location: [facility] ENDOSCOPY;  Service: Cardiovascular;  Laterality: N/A;   CARDIOVERSION N/A 06/23/2018   Procedure: CARDIOVERSION;  Surgeon: [name], MD;  Location: [facility];  Service: Cardiovascular;  Laterality: N/A;   CARDIOVERSION N/A 03/11/2020   Procedure: CARDIOVERSION;  Surgeon: [name], MD;  Location: [facility];  Service: Cardiovascular;  Laterality: N/A;   excision of actinic keratosis     EYE SURGERY     eye lift   IR CT HEAD LTD  01/27/2020   IR PERCUTANEOUS ART THROMBECTOMY/INFUSION INTRACRANIAL INC DIAG ANGIO  01/27/2020       IR PERCUTANEOUS ART  THROMBECTOMY/INFUSION INTRACRANIAL INC DIAG ANGIO  01/27/2020   RADIOLOGY WITH ANESTHESIA N/A 01/27/2020   Procedure: IR WITH ANESTHESIA;  Surgeon: Radiologist, Medication, MD;  Location: [facility];  Service: Radiology;  Laterality: N/A;   TEE WITHOUT CARDIOVERSION N/A 03/11/2020   Procedure: TRANSESOPHAGEAL ECHOCARDIOGRAM (TEE);  Surgeon: [name], MD;  Location: [facility];  Service: Cardiovascular;  Laterality: N/A;   TONSILLECTOMY     WISDOM TOOTH EXTRACTION      There were no vitals filed for this visit.   Subjective Assessment - 04/12/20 0826    Subjective Eeee- - -dee. (asking for Dustin Schneider to join)    Patient is accompained by: --   Dustin Schneider- SO   Currently in Pain? No/denies                 ADULT SLP TREATMENT - 04/12/20 0834      General Information   Behavior/Cognition Alert;Cooperative;Pleasant mood      Treatment Provided   Treatment provided Cognitive-Linquistic      Cognitive-Linquistic Treatment   Treatment focused on Aphasia;Apraxia    Skilled Treatment Pt brought three sculptor items and SLP provided mod-max cues consistently faded to usually - pt with focused difficulty with /k/ phoneme. SLP then worked on [employer] with him, requiring written and drawing cues for increasing success (e.g., "oars" was rewritten "OH-rs" for pt's incr'd success).  SLP attempted for approx 4 minutes with written cues, pt attmepting written cues "fire" for explaining why he  wasn't getting a AFO at this time. Finally SLP went to ask PTA, who said pt's tone too high right now - muscles on inside of leg are firing too much.      Assessment / Recommendations / Plan   Plan Continue with current plan of care      Progression Toward Goals   Progression toward goals Progressing toward goals              SLP Short Term Goals - 04/12/20 1315      SLP SHORT TERM GOAL #1   Title Pt will name family, friends and art items with occasional min A 8/10 with approximations  allowed.    Time 4    Period Weeks    Status On-going      SLP SHORT TERM GOAL #2   Title Pt will name 7 items in personally relevant categories with occasional min A over 2 sessions    Time 4    Period Weeks    Status On-going      SLP SHORT TERM GOAL #3   Title Pt will perform 4 automatic speech tasks with rare min A.    Time 4    Period Weeks    Status On-going      SLP SHORT TERM GOAL #4   Title Pt will write personal information (name/address/phone), 5 family/friend/pet names and 7  professional words with occasional min A    Time 5    Period Weeks    Status On-going      SLP SHORT TERM GOAL #5   Title Pt will use mulitmodal communication (gesture, draw, write 1st letter etc) to augment verbal expression to meet needs at home with rare min A from family.    Time 4    Period Weeks    Status On-going            SLP Long Term Goals - 04/12/20 1316      SLP LONG TERM GOAL #1   Title Pt will verbalize 3 words to explain or describe his artwork and hobbies with occasional min A over 3 sessions, allowing for intellgible approximations    Time 10    Period Weeks    Status On-going      SLP LONG TERM GOAL #2   Title Pt and family will utilize multimodal compensations for aphasia and verbal apraxia to participate in 3 turns each of conversation with occasional min A over 2 sessions    Time 10    Period Weeks    Status On-going      SLP LONG TERM GOAL #3   Title Pt will write 3 word phrases with less than 2 errors with rare min A over 3 sessions    Time 10    Period Weeks    Status On-going      SLP LONG TERM GOAL #4   Title Reading assessment if indicated    Time 5    Period Weeks    Status On-going            Plan - 04/12/20 1315    Clinical Impression Statement Pt presents today with cont'd mod Broca's type aphasia and mod-severe verbal apraxia. SLP See "skilled intervention" for more details. [place]itten and drawing cues were successful at incr'ing pt's  success with verbal expression. I cont to recommend skilled ST to maximize communication for QOL, safety to reduce family burden.    Speech Therapy Frequency 2x / week  Duration --   12 weeks or 25 visits   Treatment/Interventions Environmental controls;Cueing hierarchy;SLP instruction and feedback;Compensatory strategies;Functional tasks;Cognitive reorganization;Compensatory techniques;Internal/external aids;Multimodal communcation approach;Patient/family education;Language facilitation    Potential Considerations Severity of impairments           Patient will benefit from skilled therapeutic intervention in order to improve the following deficits and impairments:   Verbal apraxia  Aphasia    Problem List Patient Active Problem List   Diagnosis Date Noted   Neurogenic bladder 03/17/2020   Sepsis due to pneumonia ([place]) 03/06/2020   History of stroke with residual deficit 03/06/2020   Transient hypotension 03/06/2020   Spastic hemiplegia affecting nondominant side (HCC)    History of hypertension    Expressive aphasia    AKI (acute kidney injury) ([place])    Global aphasia    Hypoalbuminemia due to protein-calorie malnutrition ([place])    Dysphagia, post-stroke    Hyperlipidemia 02/02/2020   Dysphagia following cerebral infarction 02/02/2020   Aspiration pneumonia ([place]) 02/02/2020   Acute ischemic left MCA stroke ([place]) 02/02/2020   Acute respiratory failure ([place])    Acute ischemic stroke ([place]) s/p clot retrieval L MCA & ACA A3 01/27/2020   Aspiration into airway 01/27/2020   Chronic anticoagulation 01/27/2020   Paroxysmal atrial fibrillation ([place]) 06/15/2018   Essential hypertension 06/15/2018   NICM (nonischemic cardiomyopathy) ([place]) 06/15/2018   Visit for monitoring Tikosyn therapy 06/12/2018    [facility] ,[place], [place]  04/12/2020, 1:18 PM  [facility] [address] [place], [place],  [address] Phone: [phone]   Fax:  [phone]   Name: GEOFFREY HYNES MRN: [id number] Date of Birth: March 27, 1956

## 2020-04-12 NOTE — Telephone Encounter (Signed)
DME order for R AFO done, to JM/NP to sign.

## 2020-04-12 NOTE — Therapy (Signed)
[facility] [address] [place], [place], [address] Phone: [phone]   Fax:  [phone]  Physical Therapy Treatment  Patient Details  Name: Dustin Schneider MRN: [id number] Date of Birth: 30-Dec-1955 Referring Provider (PT): Lauraine Rinne, PA-C (will be followed by Dr. Posey Pronto)   Encounter Date: 04/12/2020   PT End of Session - 04/12/20 0733    Visit Number 9    Number of Visits 25    Date for PT Re-Evaluation 06/14/20    Authorization Type BCBS    PT Start Time [phone]   pt late for appt today   PT Stop Time 0800    PT Time Calculation (min) 29 min    Equipment Utilized During Treatment Gait belt    Activity Tolerance Patient tolerated treatment well    Behavior During Therapy [facility] for tasks assessed/performed           Past Medical History:  Diagnosis Date  . Hypertension   . NICM (nonischemic cardiomyopathy) ([place]) 06/15/2018   04/23/18: EF 45%, 09/15/18: NOrmal LVEF  . Paroxysmal atrial fibrillation ([place]) 06/15/2018  . Stroke ([place])   . Visit for monitoring Tikosyn therapy 06/12/2018    Past Surgical History:  Procedure Laterality Date  . BUBBLE STUDY  03/11/2020   Procedure: BUBBLE STUDY;  Surgeon: [name], MD;  Location: [place];  Service: Cardiovascular;;  . CARDIOVERSION N/A 05/13/2018   Procedure: CARDIOVERSION;  Surgeon: [name], MD;  Location: [facility] ENDOSCOPY;  Service: Cardiovascular;  Laterality: N/A;  . CARDIOVERSION N/A 06/13/2018   Procedure: CARDIOVERSION;  Surgeon: [name], MD;  Location: [facility] ENDOSCOPY;  Service: Cardiovascular;  Laterality: N/A;  . CARDIOVERSION N/A 06/23/2018   Procedure: CARDIOVERSION;  Surgeon: [name], MD;  Location: [facility] ENDOSCOPY;  Service: Cardiovascular;  Laterality: N/A;  . CARDIOVERSION N/A 03/11/2020   Procedure: CARDIOVERSION;  Surgeon: [name], MD;  Location: [place];  Service: Cardiovascular;  Laterality: N/A;  . excision of actinic keratosis     . EYE SURGERY     eye lift  . IR CT HEAD LTD  01/27/2020  . IR PERCUTANEOUS ART THROMBECTOMY/INFUSION INTRACRANIAL INC DIAG ANGIO  01/27/2020      . IR PERCUTANEOUS ART THROMBECTOMY/INFUSION INTRACRANIAL INC DIAG ANGIO  01/27/2020  . RADIOLOGY WITH ANESTHESIA N/A 01/27/2020   Procedure: IR WITH ANESTHESIA;  Surgeon: Radiologist, Medication, MD;  Location: [place];  Service: Radiology;  Laterality: N/A;  . TEE WITHOUT CARDIOVERSION N/A 03/11/2020   Procedure: TRANSESOPHAGEAL ECHOCARDIOGRAM (TEE);  Surgeon: [name], MD;  Location: [place];  Service: Cardiovascular;  Laterality: N/A;  . TONSILLECTOMY    . WISDOM TOOTH EXTRACTION      There were no vitals filed for this visit.   Subjective Assessment - 04/12/20 0732    Subjective No new complaints. No falls or pain to report.    Patient is accompained by: Family member    Pertinent History PMH: HTN, paroxysmal a fib    Limitations Standing;Walking    Patient Stated Goals "wants everything working on R side again"                [facility] Adult PT Treatment/Exercise - 04/12/20 0734      Transfers   Transfers Sit to Stand;Stand to Sit;Stand Pivot Transfers    Sit to Stand 4: Min guard;4: Min assist;With upper extremity assist;From bed;From chair/3-in-1    Sit to Stand Details Verbal cues for technique;Verbal cues for safe use of DME/AE;Manual facilitation for weight shifting  Sit to Stand Details (indicate cue type and reason) cues/assist to ensure right LE placement prior to standing, for weight shifting and to secure right hand in orthotic on standing prior to attempting to step     Stand to Sit 4: Min guard;4: Min assist;With upper extremity assist;To chair/3-in-1;To bed;Uncontrolled descent    Stand to Sit Details (indicate cue type and reason) Verbal cues for sequencing;Verbal cues for safe use of DME/AE;Manual facilitation for weight shifting;Manual facilitation for placement    Stand to Sit Details cues to back all the way to  surface and reach back with left UE. PTA tipped walker with sitting due to right hand still in hand orthotic.       Ambulation/Gait   Ambulation/Gait Yes    Ambulation/Gait Assistance 3: Mod assist    Ambulation/Gait Assistance Details 1st lap with anterior ottobock with t-strap with significant ER noted with swing phase, fast extension moment in stance on right LE, poor step placement with decreased weight shifting/stance time on right side;. on 2cd rep with spry step no strap noted less extension moment at stance, still with significant ER and poor step placement/weight shifting. simulated toe cap used with all gait. standing with no brace pt with significant tone causing ankle supination with attempt to step right LE with no brace on. Had pt sit back down due to poor safety/LE stability with no brace on.     Ambulation Distance (Feet) 115 Feet   x2   Assistive device Rolling walker    Gait Pattern Step-to pattern;Decreased stride length;Decreased stance time - right;Decreased step length - right;Decreased hip/knee flexion - right;Decreased weight shift to right;Right steppage;Lateral hip instability;Decreased trunk rotation;Narrow base of support;Poor foot clearance - right   right hip ER,    Ambulation Surface Level;Indoor                 PT Short Term Goals - 04/12/20 0733      PT SHORT TERM GOAL #1   Title Pt and pt's family members will be independent with initial HEP in order to build upon functional gains made in therapy. ALL LTGS DUE 04/13/20    Time 4    Period Weeks    Status New    Target Date 04/13/20      PT SHORT TERM GOAL #2   Title Pt will perform sit > supine with supervision and supine > sit with min guard for bed mobility in order to decr caregiver burden.    Time 4    Period Weeks    Status New      PT SHORT TERM GOAL #3   Title Pt will undergo assessment of AFO for functional transfers and gait.    Time 4    Period Weeks    Status New      PT SHORT TERM  GOAL #4   Title Pt will ambulate 30' with RW with mod A x1 in order to improve functional mobility.    Time 4    Period Weeks    Status New      PT SHORT TERM GOAL #5   Title Pt will perform sit <> stand with RW vs. Stedy with min A in order to improve functional BLE strength for transfers.    Time 4    Period Weeks    Status New      PT SHORT TERM GOAL #6   Title Pt will perform squat pivot transfers from w/c <> mat table with min  guard.    Baseline min A for blocking R knee    Time 4    Period Weeks    Status New             PT Long Term Goals - 03/16/20 1139      PT LONG TERM GOAL #1   Title Pt and pt's family members will be independent with final HEP in order to build upon functional gains made in therapy. ALL LTGS DUE 06/08/20    Time 12    Period Weeks    Status New    Target Date 06/08/20      PT LONG TERM GOAL #2   Title Pt will ambulate 115' with min guard/min A with RW and R AFO in order to improve household mobility.    Time 12    Period Weeks    Status New      PT LONG TERM GOAL #3   Title Pt will perform sit <> stand with RW with min guard in order to improve functional BLE strength for transfers.    Time 12    Period Weeks    Status New      PT LONG TERM GOAL #4   Title Pt will perform squat pivot transfer with mod I vs. stand pivot transfer with RW with min guard in order to decr caregiver burden.    Time 12    Period Weeks    Status New                 Plan - 04/12/20 0733    Clinical Impression Statement Today's skilled session focused on gait training with multiple braces along side [name] from [facility] Pt with increased tone in right LE today vs previous sessions requiring increased assistance wtih gait. Was decided to not persue bracing options at this time and allow for pt's tone to become more controlled as his bracing needs will change once the tone is better managed. The pt is progressing toward goals and should benefit from  continued PT to progress toward unmet goals.    Personal Factors and Comorbidities Comorbidity 3+    Comorbidities dense L MCA CVA, paroxysmal A. fib on chronic Xarelto, nonischemic cardiomyopathy and hypertension.    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, works as an Electrical engineer Evolving/Moderate complexity    Rehab Potential Good    PT Frequency 2x / week    PT Duration 12 weeks    PT Treatment/Interventions ADLs/Self Care Home Management;Aquatic Therapy;DME Instruction;Gait training;Stair training;Functional mobility training;Therapeutic activities;Therapeutic exercise;Electrical Stimulation;Balance training;Neuromuscular re-education;Wheelchair mobility training;Orthotic Fit/Training;Patient/family education;Passive range of motion;Energy conservation    PT Next Visit Plan STGs due 9/1. jex's/stretching to decrease tone in right hi adductors/ankle supination; any standing exercises that pt can safely do at home at the counter (or can pt borrow our AFO for home standing use only??) continue gait training with brace, t-strap and hand orthotic; bioness to RLE (ant tib and quad) for strengthening/muscle re-ed, weight shifting/NMR to RLE.    Consulted and Agree with Plan of Care Patient;Family member/caregiver    Family Member Consulted gf [name]           Patient will benefit from skilled therapeutic intervention in order to improve the following deficits and impairments:  Abnormal gait, Decreased activity tolerance, Decreased balance, Decreased cognition, Decreased mobility, Decreased coordination, Decreased range of motion, Decreased endurance, Decreased  strength, Hypomobility, Difficulty walking, Impaired UE functional use, Impaired vision/preception, Postural dysfunction,  Impaired sensation  Visit Diagnosis: Hemiplegia and hemiparesis following cerebral infarction affecting right dominant side (HCC)  Other abnormalities of gait and mobility  Unsteadiness on feet  Muscle weakness (generalized)  Difficulty in walking, not elsewhere classified     Problem List Patient Active Problem List   Diagnosis Date Noted  . Neurogenic bladder 03/17/2020  . Sepsis due to pneumonia ([place]) 03/06/2020  . History of stroke with residual deficit 03/06/2020  . Transient hypotension 03/06/2020  . Spastic hemiplegia affecting nondominant side ([place])   . History of hypertension   . Expressive aphasia   . AKI (acute kidney injury) ([place])   . Global aphasia   . Hypoalbuminemia due to protein-calorie malnutrition ([place])   . Dysphagia, post-stroke   . Hyperlipidemia 02/02/2020  . Dysphagia following cerebral infarction 02/02/2020  . Aspiration pneumonia ([place]) 02/02/2020  . Acute ischemic left MCA stroke ([place]) 02/02/2020  . Acute respiratory failure ([place])   . Acute ischemic stroke [facility]) s/p clot retrieval L MCA & ACA A3 01/27/2020  . Aspiration into airway 01/27/2020  . Chronic anticoagulation 01/27/2020  . Paroxysmal atrial fibrillation ([place]) 06/15/2018  . Essential hypertension 06/15/2018  . NICM (nonischemic cardiomyopathy) ([place]) 06/15/2018  . Visit for monitoring Tikosyn therapy 06/12/2018    Willow Ora, PTA, [facility] [address] [place], [place] [address] [phone] 04/12/20, 4:56 PM   Name: Dustin Schneider MRN: [id number] Date of Birth: 1956/02/16

## 2020-04-12 NOTE — Therapy (Signed)
[facility] [address] [place], [place], [address] Phone: [phone]   Fax:  [phone]  Occupational Therapy Treatment  Patient Details  Name: Dustin Schneider MRN: [id number] Date of Birth: [birth year] No data recorded  Encounter Date: 04/12/2020   OT End of Session - 04/12/20 0939    Visit Number 6    Number of Visits 25    Date for OT Re-Evaluation 06/23/20    Authorization Type BC/BS - No auth required, covered 100%    OT Start Time 0850    OT Stop Time 0930    OT Time Calculation (min) 40 min    Activity Tolerance Patient tolerated treatment well    Behavior During Therapy [facility] for tasks assessed/performed           Past Medical History:  Diagnosis Date  . Hypertension   . NICM (nonischemic cardiomyopathy) ([place]) 06/15/2018   04/23/18: EF 45%, 09/15/18: NOrmal LVEF  . Paroxysmal atrial fibrillation ([place]) 06/15/2018  . Stroke ([place])   . Visit for monitoring Tikosyn therapy 06/12/2018    Past Surgical History:  Procedure Laterality Date  . BUBBLE STUDY  03/11/2020   Procedure: BUBBLE STUDY;  Surgeon: [name], MD;  Location: [place];  Service: Cardiovascular;;  . CARDIOVERSION N/A 05/13/2018   Procedure: CARDIOVERSION;  Surgeon: [name], MD;  Location: [facility] ENDOSCOPY;  Service: Cardiovascular;  Laterality: N/A;  . CARDIOVERSION N/A 06/13/2018   Procedure: CARDIOVERSION;  Surgeon: [name], MD;  Location: [facility] ENDOSCOPY;  Service: Cardiovascular;  Laterality: N/A;  . CARDIOVERSION N/A 06/23/2018   Procedure: CARDIOVERSION;  Surgeon: [name], MD;  Location[facility] ENDOSCOPY;  Service: Cardiovascular;  Laterality: N/A;  . CARDIOVERSION N/A 03/11/2020   Procedure: CARDIOVERSION;  Surgeon: [name], MD;  Location: [place];  Service: Cardiovascular;  Laterality: N/A;  . excision of actinic keratosis    . EYE SURGERY     eye lift  . IR CT HEAD LTD  01/27/2020  . IR PERCUTANEOUS ART  THROMBECTOMY/INFUSION INTRACRANIAL INC DIAG ANGIO  01/27/2020      . IR PERCUTANEOUS ART THROMBECTOMY/INFUSION INTRACRANIAL INC DIAG ANGIO  01/27/2020  . RADIOLOGY WITH ANESTHESIA N/A 01/27/2020   Procedure: IR WITH ANESTHESIA;  Surgeon: Radiologist, Medication, MD;  Location: [place];  Service: Radiology;  Laterality: N/A;  . TEE WITHOUT CARDIOVERSION N/A 03/11/2020   Procedure: TRANSESOPHAGEAL ECHOCARDIOGRAM (TEE);  Surgeon: [name], MD;  Location: [place];  Service: Cardiovascular;  Laterality: N/A;  . TONSILLECTOMY    . WISDOM TOOTH EXTRACTION      There were no vitals filed for this visit.   Subjective Assessment - 04/12/20 0855    Patient is accompanied by: Family member    Pertinent History Lt MCA CVA 01/27/20, readmitted to hospital with HAP and fever on 03/06/20 and released 03/11/20. PMH: paroxsymal A-fib, HTN    Limitations fall    Currently in Pain? No/denies            Pt performing AA/ROM in shoulder flexion RUE using UE Ranger and cues to control and sustain arm in flexion.  Functional low level reaching with focus on fully opening hand before grasping cylindrical objects, getting fingers fully around object, and then performing sufficient wrist ext and radial deviation to place on different surface w/ min assist distally RUE. Pt shown how to do at home with minor adjustments (bringing to mouth and back down on same surface) w/ Lt hand stabalizing object when grasping/releasing - pt required  cues to fully release object before pulling back arm. Pt then practiced flipping large cards over Rt hand w/ focus on full finger extension first.  Pt practiced writing name with built up pen and min assist to stabalize pen and support distally                       OT Short Term Goals - 04/12/20 0939      OT SHORT TERM GOAL #1   Title Independent with initial HEP    Time 4    Period Weeks    Status On-going      OT SHORT TERM GOAL #2   Title Pt to perform low  to mid level reaching of light objects RUE in prep for feeding self and bathing    Time 4    Period Weeks    Status On-going      OT SHORT TERM GOAL #3   Title Pt to feed self 50% of the time with Rt dominant hand and A/E prn    Time 4    Period Weeks    Status On-going      OT SHORT TERM GOAL #4   Title Pt to perform UE dressing of pullover shirt mod I level    Time 4    Period Weeks    Status New      OT SHORT TERM GOAL #5   Title Pt to perform LE dressing and all bathing with no more than mod assist using A/E and strategies prn    Time 4    Period Weeks    Status New      OT SHORT TERM GOAL #6   Title Pt to perform toileting and toilet transfers with no more than min assist overall    Time 4    Period Weeks    Status New      OT SHORT TERM GOAL #7   Title Improve functional use of RUE as evidenced by performing 8 blocks on Box & Blocks test    Baseline 0    Time 4    Period Weeks    Status New             OT Long Term Goals - 03/23/20 1346      OT LONG TERM GOAL #1   Title Pt to be independent with updated HEP    Time 12    Period Weeks    Status New      OT LONG TERM GOAL #2   Title Pt to perform all BADLS at mod I level    Time 12    Period Weeks    Status New      OT LONG TERM GOAL #3   Title Pt to feed self 75% or greater with Rt dominant hand and grooming 50% or more with Rt hand    Time 12    Period Weeks    Status New      OT LONG TERM GOAL #4   Title Pt to retrieve light weight objects from high shelf RUE consistently    Time 12    Period Weeks    Status New      OT LONG TERM GOAL #5   Title Pt to demo 25 lbs grip strength or more Rt dominant hand for gripping, opening jars/containers    Time 12    Period Weeks    Status New      Long Term  Additional Goals   Additional Long Term Goals Yes      OT LONG TERM GOAL #6   Title Pt to perform simple meal prep and light cleaning tasks mod I level    Time 12    Period Weeks    Status  New      OT LONG TERM GOAL #7   Title Improve functional use RUE as evidenced by performing 25 blocks on Box & Blocks and performing 9 hole peg test in 90 sec or under    Time 12    Period Weeks    Status New      OT LONG TERM GOAL #8   Title Pt to returned to modified art/sculpting tasks with task modifications and A/E prn    Time 12    Period Weeks    Status New                 Plan - 04/12/20 0940    Clinical Impression Statement Pt is progressing towards goals with improved RUE functional use and control. Pt required cues to fully get hand around object and fully release object before moving hand away    OT Occupational Profile and History Detailed Assessment- Review of Records and additional review of physical, cognitive, psychosocial history related to current functional performance    Occupational performance deficits (Please refer to evaluation for details): ADL's;IADL's;Work;Leisure;Social Participation    Body Structure / Function / Physical Skills ADL;ROM;Dexterity;Balance;IADL;Body mechanics;Improper spinal/pelvic alignment;Sensation;Mobility;Flexibility;Strength;Coordination;FMC;Tone;UE functional use;Decreased knowledge of use of DME;Proprioception    Rehab Potential Good    Clinical Decision Making Several treatment options, min-mod task modification necessary    Comorbidities Affecting Occupational Performance: May have comorbidities impacting occupational performance    Modification or Assistance to Complete Evaluation  Min-Moderate modification of tasks or assist with assess necessary to complete eval    OT Frequency 2x / week    OT Duration 12 weeks   pluse eval   OT Treatment/Interventions Self-care/ADL training;Therapeutic exercise;Functional Mobility Training;Neuromuscular education;Manual Therapy;Splinting;Aquatic Therapy;Manual lymph drainage;Therapeutic activities;Coping strategies training;DME and/or AE instruction;Electrical  Stimulation;Fluidtherapy;Passive range of motion;Patient/family education    Plan functional use RUE, NMR    Consulted and Agree with Plan of Care Patient           Patient will benefit from skilled therapeutic intervention in order to improve the following deficits and impairments:   Body Structure / Function / Physical Skills: ADL, ROM, Dexterity, Balance, IADL, Body mechanics, Improper spinal/pelvic alignment, Sensation, Mobility, Flexibility, Strength, Coordination, FMC, Tone, UE functional use, Decreased knowledge of use of DME, Proprioception       Visit Diagnosis: Hemiplegia and hemiparesis following cerebral infarction affecting right dominant side (HCC)  Other lack of coordination    Problem List Patient Active Problem List   Diagnosis Date Noted  . Neurogenic bladder 03/17/2020  . Sepsis due to pneumonia ([place]) 03/06/2020  . History of stroke with residual deficit 03/06/2020  . Transient hypotension 03/06/2020  . Spastic hemiplegia affecting nondominant side ([place])   . History of hypertension   . Expressive aphasia   . AKI (acute kidney injury) ([place])   . Global aphasia   . Hypoalbuminemia due to protein-calorie malnutrition ([place])   . Dysphagia, post-stroke   . Hyperlipidemia 02/02/2020  . Dysphagia following cerebral infarction 02/02/2020  . Aspiration pneumonia ([place]) 02/02/2020  . Acute ischemic left MCA stroke ([place]) 02/02/2020  . Acute respiratory failure ([place])   . Acute ischemic stroke (HCC) s/p clot retrieval L MCA & ACA A3  01/27/2020  . Aspiration into airway 01/27/2020  . Chronic anticoagulation 01/27/2020  . Paroxysmal atrial fibrillation ([place] [address]) 06/15/2018  . Essential hypertension 06/15/2018  . NICM (nonischemic cardiomyopathy) ([place]) 06/15/2018  . Visit for monitoring Tikosyn therapy 06/12/2018    Carey Bullocks, OTR/L 04/12/2020, 2:12 PM  [facility] [address] [place],  [place], [address] Phone: [phone]   Fax:  [phone]  Name: Dustin Schneider MRN: [id number] Date of Birth: [birth year]

## 2020-04-14 ENCOUNTER — Ambulatory Visit: Payer: BC Managed Care – PPO | Attending: Physician Assistant | Admitting: Occupational Therapy

## 2020-04-14 ENCOUNTER — Encounter: Payer: Self-pay | Admitting: Occupational Therapy

## 2020-04-14 ENCOUNTER — Other Ambulatory Visit: Payer: Self-pay

## 2020-04-14 DIAGNOSIS — I69351 Hemiplegia and hemiparesis following cerebral infarction affecting right dominant side: Secondary | ICD-10-CM | POA: Diagnosis not present

## 2020-04-14 DIAGNOSIS — R482 Apraxia: Secondary | ICD-10-CM | POA: Diagnosis present

## 2020-04-14 DIAGNOSIS — M6281 Muscle weakness (generalized): Secondary | ICD-10-CM | POA: Diagnosis present

## 2020-04-14 DIAGNOSIS — R4701 Aphasia: Secondary | ICD-10-CM | POA: Diagnosis present

## 2020-04-14 DIAGNOSIS — R600 Localized edema: Secondary | ICD-10-CM | POA: Diagnosis present

## 2020-04-14 DIAGNOSIS — R29818 Other symptoms and signs involving the nervous system: Secondary | ICD-10-CM | POA: Insufficient documentation

## 2020-04-14 DIAGNOSIS — R2689 Other abnormalities of gait and mobility: Secondary | ICD-10-CM | POA: Diagnosis present

## 2020-04-14 DIAGNOSIS — R2681 Unsteadiness on feet: Secondary | ICD-10-CM | POA: Diagnosis present

## 2020-04-14 DIAGNOSIS — R278 Other lack of coordination: Secondary | ICD-10-CM | POA: Diagnosis present

## 2020-04-14 DIAGNOSIS — R262 Difficulty in walking, not elsewhere classified: Secondary | ICD-10-CM | POA: Insufficient documentation

## 2020-04-14 DIAGNOSIS — G8110 Spastic hemiplegia affecting unspecified side: Secondary | ICD-10-CM | POA: Diagnosis not present

## 2020-04-14 NOTE — Therapy (Signed)
[facility] [address] [place], [place], [address] Phone: [phone]   Fax:  [phone]  Occupational Therapy Treatment  Patient Details  Name: Dustin Schneider MRN: [id number] Date of Birth: 1955/08/18 No data recorded  Encounter Date: 04/14/2020   OT End of Session - 04/14/20 0840    Visit Number 7    Number of Visits 25    Date for OT Re-Evaluation 06/23/20    Authorization Type BC/BS - No auth required, covered 100%    OT Start Time 0722    OT Stop Time 0800    OT Time Calculation (min) 38 min    Activity Tolerance Patient tolerated treatment well    Behavior During Therapy [facility] for tasks assessed/performed           Past Medical History:  Diagnosis Date  . Hypertension   . NICM (nonischemic cardiomyopathy) ([place]) 06/15/2018   04/23/18: EF 45%, 09/15/18: NOrmal LVEF  . Paroxysmal atrial fibrillation ([place]) 06/15/2018  . Stroke ([place])   . Visit for monitoring Tikosyn therapy 06/12/2018    Past Surgical History:  Procedure Laterality Date  . BUBBLE STUDY  03/11/2020   Procedure: BUBBLE STUDY;  Surgeon: [name], MD;  Location: [place];  Service: Cardiovascular;;  . CARDIOVERSION N/A 05/13/2018   Procedure: CARDIOVERSION;  Surgeon: [name], MD;  Location: [facility] ENDOSCOPY;  Service: Cardiovascular;  Laterality: N/A;  . CARDIOVERSION N/A 06/13/2018   Procedure: CARDIOVERSION;  Surgeon: [name], MD;  Location: [facility] ENDOSCOPY;  Service: Cardiovascular;  Laterality: N/A;  . CARDIOVERSION N/A 06/23/2018   Procedure: CARDIOVERSION;  Surgeon: [name], MD;  Location: [facility] ENDOSCOPY;  Service: Cardiovascular;  Laterality: N/A;  . CARDIOVERSION N/A 03/11/2020   Procedure: CARDIOVERSION;  Surgeon: [name], MD;  Location: [place];  Service: Cardiovascular;  Laterality: N/A;  . excision of actinic keratosis    . EYE SURGERY     eye lift  . IR CT HEAD LTD  01/27/2020  . IR PERCUTANEOUS ART  THROMBECTOMY/INFUSION INTRACRANIAL INC DIAG ANGIO  01/27/2020      . IR PERCUTANEOUS ART THROMBECTOMY/INFUSION INTRACRANIAL INC DIAG ANGIO  01/27/2020  . RADIOLOGY WITH ANESTHESIA N/A 01/27/2020   Procedure: IR WITH ANESTHESIA;  Surgeon: Radiologist, Medication, MD;  Location: [place];  Service: Radiology;  Laterality: N/A;  . TEE WITHOUT CARDIOVERSION N/A 03/11/2020   Procedure: TRANSESOPHAGEAL ECHOCARDIOGRAM (TEE);  Surgeon: [name], MD;  Location: [place];  Service: Cardiovascular;  Laterality: N/A;  . TONSILLECTOMY    . WISDOM TOOTH EXTRACTION      There were no vitals filed for this visit.   Subjective Assessment - 04/14/20 0903    Subjective  pt denies pain    Patient is accompanied by: Family member    Pertinent History Lt MCA CVA 01/27/20, readmitted to hospital with HAP and fever on 03/06/20 and released 03/11/20. PMH: paroxsymal A-fib, HTN    Limitations fall    Currently in Pain? No/denies           Sitting closed-chain shoulder flex to mid-range, chest press, elbow ext to floor with BUEs with ball with min cueing/facilitation for normal movement patterns.   Sitting, Wt. Bearing through R hand on mat with body on arm movements with min cueing/facilitation.  Sitting, AAROM elbow ext/light wt. Bearing with tilted stool with min cueing/facilitation for isolated movements.  Flipping large Cards with ER to assist with turning cards over with min cueing/facilitation for ER, full finger ext.  Low-range functional  reaching to remove Cylinder pegs from pegboard with min cueing for finger ext.  Then replacing with min-mod cueing (large only) with mod difficulty due to decr in-hand manipulation.  Mild range functional reach to pick up checkers and place in container with min cueing for normal movement patterns.  Picking up Cylinder object for simulated drinking with coban wrapped around object and min facilitation intermittently, min-mod cueing for normal movement patterns  initially, improved with repetition.        OT Education - 04/14/20 [phone]    Education Details positioning/normal movement patterns of RUE with functional use/avoiding compensations strategies    Person(s) Educated Patient;Caregiver(s)    Methods Explanation;Demonstration;Verbal cues;Tactile cues    Comprehension Verbalized understanding;Returned demonstration;Verbal cues required;Tactile cues required            OT Short Term Goals - 04/12/20 0939      OT SHORT TERM GOAL #1   Title Independent with initial HEP    Time 4    Period Weeks    Status On-going      OT SHORT TERM GOAL #2   Title Pt to perform low to mid level reaching of light objects RUE in prep for feeding self and bathing    Time 4    Period Weeks    Status On-going      OT SHORT TERM GOAL #3   Title Pt to feed self 50% of the time with Rt dominant hand and A/E prn    Time 4    Period Weeks    Status On-going      OT SHORT TERM GOAL #4   Title Pt to perform UE dressing of pullover shirt mod I level    Time 4    Period Weeks    Status New      OT SHORT TERM GOAL #5   Title Pt to perform LE dressing and all bathing with no more than mod assist using A/E and strategies prn    Time 4    Period Weeks    Status New      OT SHORT TERM GOAL #6   Title Pt to perform toileting and toilet transfers with no more than min assist overall    Time 4    Period Weeks    Status New      OT SHORT TERM GOAL #7   Title Improve functional use of RUE as evidenced by performing 8 blocks on Box & Blocks test    Baseline 0    Time 4    Period Weeks    Status New             OT Long Term Goals - 03/23/20 1346      OT LONG TERM GOAL #1   Title Pt to be independent with updated HEP    Time 12    Period Weeks    Status New      OT LONG TERM GOAL #2   Title Pt to perform all BADLS at mod I level    Time 12    Period Weeks    Status New      OT LONG TERM GOAL #3   Title Pt to feed self 75% or greater with  Rt dominant hand and grooming 50% or more with Rt hand    Time 12    Period Weeks    Status New      OT LONG TERM GOAL #4   Title Pt to retrieve  light weight objects from high shelf RUE consistently    Time 12    Period Weeks    Status New      OT LONG TERM GOAL #5   Title Pt to demo 25 lbs grip strength or more Rt dominant hand for gripping, opening jars/containers    Time 12    Period Weeks    Status New      Long Term Additional Goals   Additional Long Term Goals Yes      OT LONG TERM GOAL #6   Title Pt to perform simple meal prep and light cleaning tasks mod I level    Time 12    Period Weeks    Status New      OT LONG TERM GOAL #7   Title Improve functional use RUE as evidenced by performing 25 blocks on Box & Blocks and performing 9 hole peg test in 90 sec or under    Time 12    Period Weeks    Status New      OT LONG TERM GOAL #8   Title Pt to returned to modified art/sculpting tasks with task modifications and A/E prn    Time 12    Period Weeks    Status New                 Plan - 04/14/20 0840    Clinical Impression Statement Pt is progressing towards goals with improved RUE functional use and control. Pt required min cues for normal movement patterns with RUE functional use, but responded well to cueing.    OT Occupational Profile and History Detailed Assessment- Review of Records and additional review of physical, cognitive, psychosocial history related to current functional performance    Occupational performance deficits (Please refer to evaluation for details): ADL's;IADL's;Work;Leisure;Social Participation    Body Structure / Function / Physical Skills ADL;ROM;Dexterity;Balance;IADL;Body mechanics;Improper spinal/pelvic alignment;Sensation;Mobility;Flexibility;Strength;Coordination;FMC;Tone;UE functional use;Decreased knowledge of use of DME;Proprioception    Rehab Potential Good    Clinical Decision Making Several treatment options, min-mod task  modification necessary    Comorbidities Affecting Occupational Performance: May have comorbidities impacting occupational performance    Modification or Assistance to Complete Evaluation  Min-Moderate modification of tasks or assist with assess necessary to complete eval    OT Frequency 2x / week    OT Duration 12 weeks   pluse eval   OT Treatment/Interventions Self-care/ADL training;Therapeutic exercise;Functional Mobility Training;Neuromuscular education;Manual Therapy;Splinting;Aquatic Therapy;Manual lymph drainage;Therapeutic activities;Coping strategies training;DME and/or AE instruction;Electrical Stimulation;Fluidtherapy;Passive range of motion;Patient/family education    Plan functional use RUE, NMR    Consulted and Agree with Plan of Care Patient           Patient will benefit from skilled therapeutic intervention in order to improve the following deficits and impairments:   Body Structure / Function / Physical Skills: ADL, ROM, Dexterity, Balance, IADL, Body mechanics, Improper spinal/pelvic alignment, Sensation, Mobility, Flexibility, Strength, Coordination, FMC, Tone, UE functional use, Decreased knowledge of use of DME, Proprioception       Visit Diagnosis: Hemiplegia and hemiparesis following cerebral infarction affecting right dominant side (HCC)  Other lack of coordination  Unsteadiness on feet  Other symptoms and signs involving the nervous system  Localized edema    Problem List Patient Active Problem List   Diagnosis Date Noted  . Neurogenic bladder 03/17/2020  . Sepsis due to pneumonia ([place]) 03/06/2020  . History of stroke with residual deficit 03/06/2020  . Transient hypotension 03/06/2020  . Spastic hemiplegia affecting  nondominant side ([place])   . History of hypertension   . Expressive aphasia   . AKI (acute kidney injury) ([place])   . Global aphasia   . Hypoalbuminemia due to protein-calorie malnutrition ([place])   . Dysphagia, post-stroke   .  Hyperlipidemia 02/02/2020  . Dysphagia following cerebral infarction 02/02/2020  . Aspiration pneumonia ([place]) 02/02/2020  . Acute ischemic left MCA stroke ([place]) 02/02/2020  . Acute respiratory failure ([place])   . Acute ischemic stroke [facility]) s/p clot retrieval L MCA & ACA A3 01/27/2020  . Aspiration into airway 01/27/2020  . Chronic anticoagulation 01/27/2020  . Paroxysmal atrial fibrillation ([place]) 06/15/2018  . Essential hypertension 06/15/2018  . NICM (nonischemic cardiomyopathy) ([place]) 06/15/2018  . Visit for monitoring Tikosyn therapy 06/12/2018    [facility] 04/14/2020, 9:06 AM  [facility] [address] [place], [place], [address] Phone: [phone]   Fax:  [phone]  Name: Dustin Schneider MRN: [id number] Date of Birth: 1955/12/02   Vianne Bulls, OTR/L [facility] [address] [place], [place]  [address] [phone] phone [phone] 04/14/20 9:06 AM

## 2020-04-15 ENCOUNTER — Other Ambulatory Visit: Payer: Self-pay | Admitting: Physical Medicine and Rehabilitation

## 2020-04-15 ENCOUNTER — Ambulatory Visit: Payer: BC Managed Care – PPO

## 2020-04-15 ENCOUNTER — Other Ambulatory Visit: Payer: Self-pay | Admitting: Cardiology

## 2020-04-15 DIAGNOSIS — R4701 Aphasia: Secondary | ICD-10-CM

## 2020-04-15 DIAGNOSIS — I69351 Hemiplegia and hemiparesis following cerebral infarction affecting right dominant side: Secondary | ICD-10-CM

## 2020-04-15 DIAGNOSIS — R482 Apraxia: Secondary | ICD-10-CM

## 2020-04-15 DIAGNOSIS — R2689 Other abnormalities of gait and mobility: Secondary | ICD-10-CM

## 2020-04-15 DIAGNOSIS — M6281 Muscle weakness (generalized): Secondary | ICD-10-CM

## 2020-04-15 NOTE — Therapy (Signed)
[facility] [address] [place], [place], [address] Phone: [phone]   Fax:  [phone]  Physical Therapy Treatment/ Progress note  Patient Details  Name: Dustin Schneider MRN: [id number] Date of Birth: [birth year] Referring Provider (PT): Lauraine Rinne, PA-C (will be followed by Dr. Posey Pronto)    Progress Note  Reporting period 03/16/20 to 04/15/20  See Note below for Objective Data and Assessment of Progress/Goals  Encounter Date: 04/15/2020   PT End of Session - 04/15/20 0847    Visit Number 10    Number of Visits 25    Date for PT Re-Evaluation 06/14/20    Authorization Type BCBS    PT Start Time [phone]    PT Stop Time 0930    PT Time Calculation (min) 44 min    Equipment Utilized During Treatment Gait belt    Activity Tolerance Patient tolerated treatment well    Behavior During Therapy [facility] for tasks assessed/performed           Past Medical History:  Diagnosis Date  . Hypertension   . NICM (nonischemic cardiomyopathy) ([place]) 06/15/2018   04/23/18: EF 45%, 09/15/18: NOrmal LVEF  . Paroxysmal atrial fibrillation ([place]) 06/15/2018  . Stroke ([place])   . Visit for monitoring Tikosyn therapy 06/12/2018    Past Surgical History:  Procedure Laterality Date  . BUBBLE STUDY  03/11/2020   Procedure: BUBBLE STUDY;  Surgeon: [name], MD;  Location: [place];  Service: Cardiovascular;;  . CARDIOVERSION N/A 05/13/2018   Procedure: CARDIOVERSION;  Surgeon: [name], MD;  Location: [facility] ENDOSCOPY;  Service: Cardiovascular;  Laterality: N/A;  . CARDIOVERSION N/A 06/13/2018   Procedure: CARDIOVERSION;  Surgeon: [name], MD;  Location: [facility] ENDOSCOPY;  Service: Cardiovascular;  Laterality: N/A;  . CARDIOVERSION N/A 06/23/2018   Procedure: CARDIOVERSION;  Surgeon: [name], MD;  Location: [facility] ENDOSCOPY;  Service: Cardiovascular;  Laterality: N/A;  . CARDIOVERSION N/A 03/11/2020   Procedure: CARDIOVERSION;  Surgeon:  [name], MD;  Location: [place];  Service: Cardiovascular;  Laterality: N/A;  . excision of actinic keratosis    . EYE SURGERY     eye lift  . IR CT HEAD LTD  01/27/2020  . IR PERCUTANEOUS ART THROMBECTOMY/INFUSION INTRACRANIAL INC DIAG ANGIO  01/27/2020      . IR PERCUTANEOUS ART THROMBECTOMY/INFUSION INTRACRANIAL INC DIAG ANGIO  01/27/2020  . RADIOLOGY WITH ANESTHESIA N/A 01/27/2020   Procedure: IR WITH ANESTHESIA;  Surgeon: Radiologist, Medication, MD;  Location: [employer];  Service: Radiology;  Laterality: N/A;  . TEE WITHOUT CARDIOVERSION N/A 03/11/2020   Procedure: TRANSESOPHAGEAL ECHOCARDIOGRAM (TEE);  Surgeon: [name], MD;  Location: [place];  Service: Cardiovascular;  Laterality: N/A;  . TONSILLECTOMY    . WISDOM TOOTH EXTRACTION      There were no vitals filed for this visit.   Subjective Assessment - 04/15/20 0848    Subjective Pt reports that he is doing ok shaking his head to no falls.    Patient is accompained by: Family member    Pertinent History PMH: HTN, paroxysmal a fib    Limitations Standing;Walking    Patient Stated Goals "wants everything working on R side again"    Currently in Pain? No/denies                             [facility] Adult PT Treatment/Exercise - 04/15/20 0849      Bed Mobility   Bed Mobility Rolling  Right;Rolling Left;Left Sidelying to Sit;Sit to Supine    Rolling Right Independent    Rolling Left Supervision/Verbal cueing   cued to bring right arm across body   Left Sidelying to Sit Minimal Assistance - Patient >75%    assist left leg off mat as did not get off prior to rising   Sit to Supine Supervision/Verbal cueing      Transfers   Transfers Sit to Stand;Stand to Sit;Squat Pivot Transfers    Sit to Stand 4: Min guard    Sit to Stand Details Verbal cues for technique    Stand to Sit 4: Min guard    Stand to Sit Details (indicate cue type and reason) Verbal cues for technique    Stand to Sit Details PT removed  right hand from hand grip attachment    Squat Pivot Transfers 4: Min guard    Squat Pivot Transfer Details (indicate cue type and reason) w/c to/from mat with PT blocking right knee ankle for safety      Ambulation/Gait   Ambulation/Gait Yes    Ambulation/Gait Assistance 4: Min assist;3: Mod assist    Ambulation/Gait Assistance Details Pt had right anterior ottobock AFO with t strap as well as right hand grip attachment. OT assisting at trunk for safety CGA and PT at RLE helping to advance and guide right foot for first 3/4 of walk then had let go to have pt perform on own. Pt was more min assist at trunk at this time with decreased right stance time and step length. Pt was, however, able to advance right foot. Pt was given verbal cues to step past right foot with left.    Ambulation Distance (Feet) 230 Feet    Assistive device Rolling walker   right hand grip attachment, right AFO with T-strap   Gait Pattern Step-to pattern;Step-through pattern;Decreased step length - right;Decreased step length - left;Decreased stance time - right;Decreased hip/knee flexion - right    Ambulation Surface Level;Indoor      Neuro Re-ed    Neuro Re-ed Details  Standing at walker: stepping forward and back x 10 each leg. Min assist on RLE. Standing in staggered stance with RLE in front x 30 sec with verbal cues to tighten right quad. Standing tapping 4" step with LLE to increase right weight shift 10 x 2 with PT providing tactile cues at right knee. Standing with LLE on step x 20 sec without LUE support min assist from PT at right knee to prevent buckling.      Exercises   Exercises Other Exercises    Other Exercises  Reviewed medbridge HEP: supine right hip flexion/ext with resistance from PT in to extension x 10, bridges x 10 with CGA to keep hip in neutral, SAQ over bolster x 10 right. [name] ankle inverts with knee extension but hip remained neutral. Performed x 10 reps. Performed supine right hip IR/ER with  straight leg x 10. PT assessed tone in hip IR and ER and minimal increase noted.                  PT Education - 04/15/20 1213    Education Details Added hip IR/ER to HEP    Person(s) Educated Patient;Caregiver(s)    Methods Explanation;Demonstration;Handout    Comprehension Verbalized understanding;Returned demonstration            PT Short Term Goals - 04/15/20 1202      PT SHORT TERM GOAL #1   Title Pt and pt's family  members will be independent with initial HEP in order to build upon functional gains made in therapy. ALL LTGS DUE 04/13/20    Baseline PT reviewed HEP with patient. Denies any issues and performing with family members at home.    Time 4    Period Weeks    Status Achieved    Target Date 04/13/20      PT SHORT TERM GOAL #2   Title Pt will perform sit > supine with supervision and supine > sit with min guard for bed mobility in order to decr caregiver burden.    Baseline 04/15/20 supervision sit to supine, min assist supine to sit as did not get right leg off mat    Time 4    Period Weeks    Status Partially Met      PT SHORT TERM GOAL #3   Title Pt will undergo assessment of AFO for functional transfers and gait.    Baseline Orthotist came last sesssion but did not feel pt tone stable enough to pursue brace yet. Will continue to assess need.    Time 4    Period Weeks    Status On-going      PT SHORT TERM GOAL #4   Title Pt will ambulate 67' with RW with mod A x1 in order to improve functional mobility.    Baseline 04/15/20 Mod assist with 2nd person CGA 230' with RW. Ability does vary    Time 4    Period Weeks    Status On-going      PT SHORT TERM GOAL #5   Title Pt will perform sit <> stand with RW vs. Stedy with min A in order to improve functional BLE strength for transfers.    Baseline CGA sit to stand at walker 04/15/20    Time 4    Period Weeks    Status Achieved      PT SHORT TERM GOAL #6   Title Pt will perform squat pivot transfers from  w/c <> mat table with min guard.    Baseline CGA w/c to/from mat 04/15/20    Time 4    Period Weeks    Status Achieved             PT Long Term Goals - 03/16/20 1139      PT LONG TERM GOAL #1   Title Pt and pt's family members will be independent with final HEP in order to build upon functional gains made in therapy. ALL LTGS DUE 06/08/20    Time 12    Period Weeks    Status New    Target Date 06/08/20      PT LONG TERM GOAL #2   Title Pt will ambulate 115' with min guard/min A with RW and R AFO in order to improve household mobility.    Time 12    Period Weeks    Status New      PT LONG TERM GOAL #3   Title Pt will perform sit <> stand with RW with min guard in order to improve functional BLE strength for transfers.    Time 12    Period Weeks    Status New      PT LONG TERM GOAL #4   Title Pt will perform squat pivot transfer with mod I vs. stand pivot transfer with RW with min guard in order to decr caregiver burden.    Time 12    Period Weeks    Status New  Plan - 04/15/20 1216    Clinical Impression Statement PT assessed STGs today. Pt showing good progress with mobility. Requiring less assistance with transfers mostly for safety at right ankle. Pt has started to ambulate in clinic trialing right anterior ottobock AFO with T-strap due to significant supination at ankle. Showing improving right quad activation with exercises but still buckles at times when WB increased needing support of brace as well. Pt continues to benefit from skilled PT to continue to progress strength, transfers, balance and gait to maximize mobiliy and independence.    Personal Factors and Comorbidities Comorbidity 3+    Comorbidities dense L MCA CVA, paroxysmal A. fib on chronic Xarelto, nonischemic cardiomyopathy and hypertension.    Examination-Activity Limitations Bathing;Bed Mobility;Bend;Dressing;Hygiene/Grooming;Stand;Squat;Locomotion Level;Transfers     Examination-Participation Restrictions Community Activity;Yard Work;Shop   walking his dog, works as an Electrical engineer Evolving/Moderate complexity    Rehab Potential Good    PT Frequency 2x / week    PT Duration 12 weeks    PT Treatment/Interventions ADLs/Self Care Home Management;Aquatic Therapy;DME Instruction;Gait training;Stair training;Functional mobility training;Therapeutic activities;Therapeutic exercise;Electrical Stimulation;Balance training;Neuromuscular re-education;Wheelchair mobility training;Orthotic Fit/Training;Patient/family education;Passive range of motion;Energy conservation    PT Next Visit Plan any standing exercises that pt can safely do at home at the counter (or can pt borrow our AFO for home standing use only??) I thought you could try aircast or something similar to see if would work short term for standing at home? continue gait training with brace, t-strap and hand orthotic; bioness to RLE (ant tib and quad) for strengthening/muscle re-ed, weight shifting/NMR to RLE.    Consulted and Agree with Plan of Care Patient;Family member/caregiver           Patient will benefit from skilled therapeutic intervention in order to improve the following deficits and impairments:  Abnormal gait, Decreased activity tolerance, Decreased balance, Decreased cognition, Decreased mobility, Decreased coordination, Decreased range of motion, Decreased endurance, Decreased strength, Hypomobility, Difficulty walking, Impaired UE functional use, Impaired vision/preception, Postural dysfunction, Impaired sensation  Visit Diagnosis: Other abnormalities of gait and mobility  Muscle weakness (generalized)  Hemiplegia and hemiparesis following cerebral infarction affecting right dominant side [facility])     Problem List Patient Active Problem List   Diagnosis Date Noted  . Neurogenic bladder 03/17/2020  . Sepsis due to pneumonia ([place]) 03/06/2020  .  History of stroke with residual deficit 03/06/2020  . Transient hypotension 03/06/2020  . Spastic hemiplegia affecting nondominant side ([place])   . History of hypertension   . Expressive aphasia   . AKI (acute kidney injury) ([place])   . Global aphasia   . Hypoalbuminemia due to protein-calorie malnutrition ([place])   . Dysphagia, post-stroke   . Hyperlipidemia 02/02/2020  . Dysphagia following cerebral infarction 02/02/2020  . Aspiration pneumonia ([place]) 02/02/2020  . Acute ischemic left MCA stroke ([place]) 02/02/2020  . Acute respiratory failure ([place])   . Acute ischemic stroke [facility]) s/p clot retrieval L MCA & ACA A3 01/27/2020  . Aspiration into airway 01/27/2020  . Chronic anticoagulation 01/27/2020  . Paroxysmal atrial fibrillation ([place]) 06/15/2018  . Essential hypertension 06/15/2018  . NICM (nonischemic cardiomyopathy) ([place]) 06/15/2018  . Visit for monitoring Tikosyn therapy 06/12/2018    Electa Sniff, PT, DPT, NCS 04/15/2020, 12:20 PM  [facility] [address] [place], [place], [address] Phone: [phone]   Fax:  [phone]  Name: Dustin Schneider MRN: [id number] Date of Birth: 1956/05/03

## 2020-04-15 NOTE — Therapy (Signed)
[facility] [address] [place], [place], [address] Phone: [phone]   Fax:  [phone]  Speech Language Pathology Treatment  Patient Details  Name: Dustin Schneider MRN: [id number] Date of Birth: 09-Mar-1956 Referring Provider (SLP): Dr. Delice Lesch   Encounter Date: 04/15/2020   End of Session - 04/15/20 2335    Visit Number 7    Number of Visits 25    Date for SLP Re-Evaluation 06/15/20    SLP Start Time 0934    SLP Stop Time  1018    SLP Time Calculation (min) 44 min    Activity Tolerance Patient tolerated treatment well           Past Medical History:  Diagnosis Date  . Hypertension   . NICM (nonischemic cardiomyopathy) ([place]) 06/15/2018   04/23/18: EF 45%, 09/15/18: NOrmal LVEF  . Paroxysmal atrial fibrillation ([place]) 06/15/2018  . Stroke ([place])   . Visit for monitoring Tikosyn therapy 06/12/2018    Past Surgical History:  Procedure Laterality Date  . BUBBLE STUDY  03/11/2020   Procedure: BUBBLE STUDY;  Surgeon: [name], MD;  Location: [facility];  Service: Cardiovascular;;  . CARDIOVERSION N/A 05/13/2018   Procedure: CARDIOVERSION;  Surgeon: [name], MD;  Location: [facility] ENDOSCOPY;  Service: Cardiovascular;  Laterality: N/A;  . CARDIOVERSION N/A 06/13/2018   Procedure: CARDIOVERSION;  Surgeon: [name], MD;  Location: [facility] ENDOSCOPY;  Service: Cardiovascular;  Laterality: N/A;  . CARDIOVERSION N/A 06/23/2018   Procedure: CARDIOVERSION;  Surgeon: [name], MD;  Location: [facility] ENDOSCOPY;  Service: Cardiovascular;  Laterality: N/A;  . CARDIOVERSION N/A 03/11/2020   Procedure: CARDIOVERSION;  Surgeon: [name], MD;  Location: [facility];  Service: Cardiovascular;  Laterality: N/A;  . excision of actinic keratosis    . EYE SURGERY     eye lift  . IR CT HEAD LTD  01/27/2020  . IR PERCUTANEOUS ART THROMBECTOMY/INFUSION INTRACRANIAL INC DIAG ANGIO  01/27/2020      . IR PERCUTANEOUS ART  THROMBECTOMY/INFUSION INTRACRANIAL INC DIAG ANGIO  01/27/2020  . RADIOLOGY WITH ANESTHESIA N/A 01/27/2020   Procedure: IR WITH ANESTHESIA;  Surgeon: Radiologist, Medication, MD;  Location: [facility];  Service: Radiology;  Laterality: N/A;  . TEE WITHOUT CARDIOVERSION N/A 03/11/2020   Procedure: TRANSESOPHAGEAL ECHOCARDIOGRAM (TEE);  Surgeon: [name], MD;  Location: [facility];  Service: Cardiovascular;  Laterality: N/A;  . TONSILLECTOMY    . WISDOM TOOTH EXTRACTION      There were no vitals filed for this visit.   Subjective Assessment - 04/15/20 2330    Subjective Pt nodded at SLP to greet him.    Patient is accompained by: Family member   [name] BIL   Currently in Pain? No/denies                 ADULT SLP TREATMENT - 04/15/20 2330      General Information   Behavior/Cognition Alert;Cooperative;Pleasant mood      Treatment Provided   Treatment provided Cognitive-Linquistic      Cognitive-Linquistic Treatment   Treatment focused on Aphasia;Apraxia    Skilled Treatment Pt used a crude approximateion of petting a dog to indicate who "Ellie" is. SLP cued pt for "woof woof" which pt did functionally. SLP took this oppotuntiy to re-educate pt/familiy on nonverbal compensations. SLP asked pt to draw a dog and he did so functionally. SLP then targeted pt's expressie language with his pt-salient sentences - pt functional 40% of the time with occasional min A.  SLP then worked with rote speech (days and months) which were 85% success with usual initial phoneme cues, and 35% successful with usual min-mod cues, respectively.       Assessment / Recommendations / Plan   Plan Continue with current plan of care      Progression Toward Goals   Progression toward goals Progressing toward goals            SLP Education - 04/15/20 2334    Education Details how to cue rote speech, prefer pt work on his salient sentences/words than rote speech    Person(s) Educated Patient;Caregiver(s)     Methods Explanation;Demonstration    Comprehension Verbalized understanding;Need further instruction            SLP Short Term Goals - 04/15/20 1005      SLP SHORT TERM GOAL #1   Title Pt will name family, friends and art items with occasional min A 8/10 with approximations allowed.    Time 4    Period Weeks    Status On-going      SLP SHORT TERM GOAL #2   Title Pt will name 7 items in personally relevant categories with occasional min A over 2 sessions    Time 4    Period Weeks    Status On-going      SLP SHORT TERM GOAL #3   Title Pt will perform 4 automatic speech tasks with rare min A.    Time 4    Period Weeks    Status On-going      SLP SHORT TERM GOAL #4   Title Pt will write personal information (name/address/phone), 5 family/friend/pet names and 7  professional words with occasional min A    Time 5    Period Weeks    Status On-going      SLP SHORT TERM GOAL #5   Title Pt will use mulitmodal communication (gesture, draw, write 1st letter etc) to augment verbal expression to meet needs at home with rare min A from family.    Time 4    Period Weeks    Status On-going            SLP Long Term Goals - 04/15/20 2336      SLP LONG TERM GOAL #1   Title Pt will verbalize 3 words to explain or describe his artwork and hobbies with occasional min A over 3 sessions, allowing for intellgible approximations    Time 10    Period Weeks    Status On-going      SLP LONG TERM GOAL #2   Title Pt and family will utilize multimodal compensations for aphasia and verbal apraxia to participate in 3 turns each of conversation with occasional min A over 2 sessions    Time 10    Period Weeks    Status On-going      SLP LONG TERM GOAL #3   Title Pt will write 3 word phrases with less than 2 errors with rare min A over 3 sessions    Time 10    Period Weeks    Status On-going      SLP LONG TERM GOAL #4   Title Reading assessment if indicated    Time 5    Period Weeks     Status On-going            Plan - 04/15/20 2335    Clinical Impression Statement Pt presents today with cont'd mod Broca's type aphasia and mod-severe verbal  apraxia. SLP See "skilled intervention" for more details. Written and drawing cues were successful at incr'ing pt's success with verbal expression. I cont to recommend skilled ST to maximize communication for QOL, safety to reduce family burden.    Speech Therapy Frequency 2x / week    Duration --   12 weeks or 25 visits   Treatment/Interventions Environmental controls;Cueing hierarchy;SLP instruction and feedback;Compensatory strategies;Functional tasks;Cognitive reorganization;Compensatory techniques;Internal/external aids;Multimodal communcation approach;Patient/family education;Language facilitation    Potential Considerations Severity of impairments           Patient will benefit from skilled therapeutic intervention in order to improve the following deficits and impairments:   Aphasia  Verbal apraxia    Problem List Patient Active Problem List   Diagnosis Date Noted  . Neurogenic bladder 03/17/2020  . Sepsis due to pneumonia ([place]) 03/06/2020  . History of stroke with residual deficit 03/06/2020  . Transient hypotension 03/06/2020  . Spastic hemiplegia affecting nondominant side ([place])   . History of hypertension   . Expressive aphasia   . AKI (acute kidney injury) ([place])   . Global aphasia   . Hypoalbuminemia due to protein-calorie malnutrition ([place])   . Dysphagia, post-stroke   . Hyperlipidemia 02/02/2020  . Dysphagia following cerebral infarction 02/02/2020  . Aspiration pneumonia ([place]) 02/02/2020  . Acute ischemic left MCA stroke ([place]) 02/02/2020  . Acute respiratory failure ([place])   . Acute ischemic stroke [facility]) s/p clot retrieval L MCA & ACA A3 01/27/2020  . Aspiration into airway 01/27/2020  . Chronic anticoagulation 01/27/2020  . Paroxysmal atrial fibrillation ([place]) 06/15/2018  . Essential hypertension  06/15/2018  . NICM (nonischemic cardiomyopathy) ([place]) 06/15/2018  . Visit for monitoring Tikosyn therapy 06/12/2018    [facility] ,[place], [place]  04/15/2020, 11:36 PM  [facility] [address] [place], [place], [address] Phone: [phone]   Fax:  [phone]   Name: KORDEL LEAVY MRN: [id number] Date of Birth: [birth year]

## 2020-04-15 NOTE — Patient Instructions (Signed)
Access Code: [email or web address] URL: [email or web address] Date: 04/15/2020 Prepared by: Cherly Anderson  Exercises Supine Hip and Knee Flexion PROM with Caregiver - 2 x daily - 5 x weekly - 2 sets - 10 reps Supine Bridge - 1 x daily - 5 x weekly - 2 sets - 10 reps Supine Knee Extension Strengthening - 1 x daily - 5 x weekly - 2 sets - 10 reps Supine Hip Internal and External Rotation - 2 x daily - 7 x weekly - 2 sets - 10 reps

## 2020-04-19 ENCOUNTER — Other Ambulatory Visit: Payer: Self-pay

## 2020-04-19 DIAGNOSIS — I480 Paroxysmal atrial fibrillation: Secondary | ICD-10-CM

## 2020-04-19 MED ORDER — METOPROLOL TARTRATE 50 MG PO TABS: 50.0000 mg | ORAL_TABLET | Freq: Two times a day (BID) | ORAL | 2 refills | Status: DC

## 2020-04-20 ENCOUNTER — Ambulatory Visit: Payer: BC Managed Care – PPO

## 2020-04-20 ENCOUNTER — Other Ambulatory Visit: Payer: Self-pay

## 2020-04-20 ENCOUNTER — Ambulatory Visit: Payer: BC Managed Care – PPO | Admitting: Physical Therapy

## 2020-04-20 ENCOUNTER — Ambulatory Visit: Payer: BC Managed Care – PPO | Admitting: Occupational Therapy

## 2020-04-20 DIAGNOSIS — R600 Localized edema: Secondary | ICD-10-CM

## 2020-04-20 DIAGNOSIS — R2689 Other abnormalities of gait and mobility: Secondary | ICD-10-CM

## 2020-04-20 DIAGNOSIS — I69351 Hemiplegia and hemiparesis following cerebral infarction affecting right dominant side: Secondary | ICD-10-CM

## 2020-04-20 DIAGNOSIS — R2681 Unsteadiness on feet: Secondary | ICD-10-CM

## 2020-04-20 DIAGNOSIS — R29818 Other symptoms and signs involving the nervous system: Secondary | ICD-10-CM

## 2020-04-20 DIAGNOSIS — R278 Other lack of coordination: Secondary | ICD-10-CM

## 2020-04-20 DIAGNOSIS — R4701 Aphasia: Secondary | ICD-10-CM

## 2020-04-20 DIAGNOSIS — R482 Apraxia: Secondary | ICD-10-CM

## 2020-04-20 DIAGNOSIS — M6281 Muscle weakness (generalized): Secondary | ICD-10-CM

## 2020-04-20 NOTE — Patient Instructions (Signed)
Access Code: [email or web address] URL: [email or web address] Date: 04/20/2020 Prepared by: [name]  Exercises Supine Hip and Knee Flexion PROM with Caregiver - 2 x daily - 5 x weekly - 2 sets - 10 reps Supine Bridge - 1 x daily - 5 x weekly - 2 sets - 10 reps Supine Knee Extension Strengthening - 1 x daily - 5 x weekly - 2 sets - 10 reps Supine Hip Internal and External Rotation - 2 x daily - 7 x weekly - 2 sets - 10 reps Supine Hip Adductor Stretch with Caregiver - 1 x daily - 7 x weekly - 3 sets - 30 hold Bent Knee Fallouts - 1 x daily - 7 x weekly - 2 sets - 10 reps

## 2020-04-20 NOTE — Therapy (Signed)
[facility] [address] [place], [place], [address] Phone: [phone]   Fax:  [phone]  Speech Language Pathology Treatment  Patient Details  Name: Dustin Schneider MRN: [id number] Date of Birth: [birth year] Referring Provider (SLP): Dr. Delice Lesch   Encounter Date: 04/20/2020   End of Session - 04/20/20 1229    Visit Number 8    Number of Visits 25    Date for SLP Re-Evaluation 06/15/20    SLP Start Time 0935    SLP Stop Time  3825    SLP Time Calculation (min) 40 min    Activity Tolerance Patient tolerated treatment well           Past Medical History:  Diagnosis Date  . Hypertension   . NICM (nonischemic cardiomyopathy) ([place]) 06/15/2018   04/23/18: EF 45%, 09/15/18: NOrmal LVEF  . Paroxysmal atrial fibrillation ([place]) 06/15/2018  . Stroke ([place])   . Visit for monitoring Tikosyn therapy 06/12/2018    Past Surgical History:  Procedure Laterality Date  . BUBBLE STUDY  03/11/2020   Procedure: BUBBLE STUDY;  Surgeon: [name], MD;  Location: [facility];  Service: Cardiovascular;;  . CARDIOVERSION N/A 05/13/2018   Procedure: CARDIOVERSION;  Surgeon: [name], MD;  Location: [facility] ENDOSCOPY;  Service: Cardiovascular;  Laterality: N/A;  . CARDIOVERSION N/A 06/13/2018   Procedure: CARDIOVERSION;  Surgeon: [name], MD;  Location[facility] ENDOSCOPY;  Service: Cardiovascular;  Laterality: N/A;  . CARDIOVERSION N/A 06/23/2018   Procedure: CARDIOVERSION;  Surgeon: [name], MD;  Location: [facility] ENDOSCOPY;  Service: Cardiovascular;  Laterality: N/A;  . CARDIOVERSION N/A 03/11/2020   Procedure: CARDIOVERSION;  Surgeon: [name], MD;  Location: [facility];  Service: Cardiovascular;  Laterality: N/A;  . excision of actinic keratosis    . EYE SURGERY     eye lift  . IR CT HEAD LTD  01/27/2020  . IR PERCUTANEOUS ART THROMBECTOMY/INFUSION INTRACRANIAL INC DIAG ANGIO  01/27/2020      . IR PERCUTANEOUS ART  THROMBECTOMY/INFUSION INTRACRANIAL INC DIAG ANGIO  01/27/2020  . RADIOLOGY WITH ANESTHESIA N/A 01/27/2020   Procedure: IR WITH ANESTHESIA;  Surgeon: Radiologist, Medication, MD;  Location: [facility];  Service: Radiology;  Laterality: N/A;  . TEE WITHOUT CARDIOVERSION N/A 03/11/2020   Procedure: TRANSESOPHAGEAL ECHOCARDIOGRAM (TEE);  Surgeon: [name], MD;  Location: [facility];  Service: Cardiovascular;  Laterality: N/A;  . TONSILLECTOMY    . WISDOM TOOTH EXTRACTION      There were no vitals filed for this visit.   Subjective Assessment - 04/20/20 0949    Subjective Pt showed SLP his iPhone to show picture of his writing practice.    Patient is accompained by: Family member   Dustin Schneider - BIL   Currently in Pain? No/denies                 ADULT SLP TREATMENT - 04/20/20 0950      General Information   Behavior/Cognition Alert;Cooperative;Pleasant mood      Treatment Provided   Treatment provided Cognitive-Linquistic      Cognitive-Linquistic Treatment   Treatment focused on Aphasia;Apraxia    Skilled Treatment Pt showed SLP iPhone wiht picture of his written homework on a white board. Work today with alveolar differentiation (/n/, /t/, /d/) with SLP max cues (tactile cues, visual cues, and verbal cues) "your tongue punches behind your teeth" - <20% success. SLP practiced compensations/pt writing with /t/ words with extra time 90% success. Pt self-corrected with success 90% of the  time. SLP again noted that when words are written out differently using other words  (e.g., "OH-rs" for "oars") pt success increases. [name] BIL with question about what to do as pt's first response to all questions is "yes" - SLP told BIL to ensure pt understands question if he asks a yes/no question, but SLP asked BIL to raise this question at beginnin of next session.      Assessment / Recommendations / Plan   Plan Continue with current plan of care      Progression Toward Goals   Progression toward goals  Progressing toward goals            SLP Education - 04/20/20 1228    Education Details ensuring pt undersands questions asked, how to cue pt at home with words/sentences    Person(s) Educated Patient;Caregiver(s)    Methods Explanation;Demonstration;Verbal cues    Comprehension Verbalized understanding;Returned demonstration;Verbal cues required;Need further instruction            SLP Short Term Goals - 04/20/20 1230      SLP SHORT TERM GOAL #1   Title Pt will name family, friends and art items with occasional min A 8/10 with approximations allowed.    Time 3    Period Weeks    Status On-going      SLP SHORT TERM GOAL #2   Title Pt will name 7 items in personally relevant categories with occasional min A over 2 sessions    Time 3    Period Weeks    Status On-going      SLP SHORT TERM GOAL #3   Title Pt will perform 4 automatic speech tasks with rare min A.    Time 3    Period Weeks    Status On-going      SLP SHORT TERM GOAL #4   Title Pt will write personal information (name/address/phone), 5 family/friend/pet names and 7  professional words with occasional min A    Time 4    Period Weeks    Status On-going      SLP SHORT TERM GOAL #5   Title Pt will use mulitmodal communication (gesture, draw, write 1st letter etc) to augment verbal expression to meet needs at home with rare min A from family.    Time 3    Period Weeks    Status On-going            SLP Long Term Goals - 04/20/20 1230      SLP LONG TERM GOAL #1   Title Pt will verbalize 3 words to explain or describe his artwork and hobbies with occasional min A over 3 sessions, allowing for intellgible approximations    Time 9    Period Weeks    Status On-going      SLP LONG TERM GOAL #2   Title Pt and family will utilize multimodal compensations for aphasia and verbal apraxia to participate in 3 turns each of conversation with occasional min A over 2 sessions    Time 9    Period Weeks    Status  On-going      SLP LONG TERM GOAL #3   Title Pt will write 3 word phrases with less than 2 errors with rare min A over 3 sessions    Time 9    Period Weeks    Status On-going      SLP LONG TERM GOAL #4   Title Reading assessment if indicated    Time 4  Period Weeks    Status On-going            Plan - 04/20/20 1229    Clinical Impression Statement Pt presents today with cont'd mod Broca's type aphasia and mod-severe verbal apraxia. SLP See "skilled intervention" for more details. Naming was 90% successful with writing given extra time, at word level. I cont to recommend skilled ST to maximize communication for QOL, safety to reduce family burden.    Speech Therapy Frequency 2x / week    Duration --   12 weeks or 25 visits   Treatment/Interventions Environmental controls;Cueing hierarchy;SLP instruction and feedback;Compensatory strategies;Functional tasks;Cognitive reorganization;Compensatory techniques;Internal/external aids;Multimodal communcation approach;Patient/family education;Language facilitation    Potential Considerations Severity of impairments           Patient will benefit from skilled therapeutic intervention in order to improve the following deficits and impairments:   Aphasia  Verbal apraxia    Problem List Patient Active Problem List   Diagnosis Date Noted  . Neurogenic bladder 03/17/2020  . Sepsis due to pneumonia ([place]) 03/06/2020  . History of stroke with residual deficit 03/06/2020  . Transient hypotension 03/06/2020  . Spastic hemiplegia affecting nondominant side ([place])   . History of hypertension   . Expressive aphasia   . AKI (acute kidney injury) ([place])   . Global aphasia   . Hypoalbuminemia due to protein-calorie malnutrition ([place])   . Dysphagia, post-stroke   . Hyperlipidemia 02/02/2020  . Dysphagia following cerebral infarction 02/02/2020  . Aspiration pneumonia ([place]) 02/02/2020  . Acute ischemic left MCA stroke ([place]) 02/02/2020  .  Acute respiratory failure ([place])   . Acute ischemic stroke [facility] s/p clot retrieval L MCA & ACA A3 01/27/2020  . Aspiration into airway 01/27/2020  . Chronic anticoagulation 01/27/2020  . Paroxysmal atrial fibrillation ([place]) 06/15/2018  . Essential hypertension 06/15/2018  . NICM (nonischemic cardiomyopathy) ([place]) 06/15/2018  . Visit for monitoring Tikosyn therapy 06/12/2018    [facility] ,[place], [place]  04/20/2020, 12:31 PM  [facility] [address] [place], [place], [address] Phone: [phone]   Fax:  [phone]   Name: Dustin Schneider MRN: [id number] Date of Birth: [birth year]

## 2020-04-20 NOTE — Therapy (Signed)
[facility] [address] [place], [place], [address] Phone: [phone]   Fax:  [phone]  Occupational Therapy Treatment  Patient Details  Name: Dustin Schneider MRN: [id number] Date of Birth: [birth year] No data recorded  Encounter Date: 04/20/2020   OT End of Session - 04/20/20 1022    Visit Number 8    Number of Visits 25    Date for OT Re-Evaluation 06/23/20    Authorization Type BC/BS - No auth required, covered 100%    OT Start Time 1020    OT Stop Time 1100    OT Time Calculation (min) 40 min    Activity Tolerance Patient tolerated treatment well    Behavior During Therapy [facility] for tasks assessed/performed           Past Medical History:  Diagnosis Date  . Hypertension   . NICM (nonischemic cardiomyopathy) ([place]) 06/15/2018   04/23/18: EF 45%, 09/15/18: NOrmal LVEF  . Paroxysmal atrial fibrillation ([place]) 06/15/2018  . Stroke ([place])   . Visit for monitoring Tikosyn therapy 06/12/2018    Past Surgical History:  Procedure Laterality Date  . BUBBLE STUDY  03/11/2020   Procedure: BUBBLE STUDY;  Surgeon: [name], MD;  Location: [place];  Service: Cardiovascular;;  . CARDIOVERSION N/A 05/13/2018   Procedure: CARDIOVERSION;  Surgeon: [name], MD;  Location: [facility] ENDOSCOPY;  Service: Cardiovascular;  Laterality: N/A;  . CARDIOVERSION N/A 06/13/2018   Procedure: CARDIOVERSION;  Surgeon: [name], MD;  Location: [facility] ENDOSCOPY;  Service: Cardiovascular;  Laterality: N/A;  . CARDIOVERSION N/A 06/23/2018   Procedure: CARDIOVERSION;  Surgeon: [name], MD;  Location: [name] ENDOSCOPY;  Service: Cardiovascular;  Laterality: N/A;  . CARDIOVERSION N/A 03/11/2020   Procedure: CARDIOVERSION;  Surgeon: [name], MD;  Location: [place];  Service: Cardiovascular;  Laterality: N/A;  . excision of actinic keratosis    . EYE SURGERY     eye lift  . IR CT HEAD LTD  01/27/2020  . IR PERCUTANEOUS ART  THROMBECTOMY/INFUSION INTRACRANIAL INC DIAG ANGIO  01/27/2020      . IR PERCUTANEOUS ART THROMBECTOMY/INFUSION INTRACRANIAL INC DIAG ANGIO  01/27/2020  . RADIOLOGY WITH ANESTHESIA N/A 01/27/2020   Procedure: IR WITH ANESTHESIA;  Surgeon: Radiologist, Medication, MD;  Location: [place];  Service: Radiology;  Laterality: N/A;  . TEE WITHOUT CARDIOVERSION N/A 03/11/2020   Procedure: TRANSESOPHAGEAL ECHOCARDIOGRAM (TEE);  Surgeon: [name], MD;  Location: [place];  Service: Cardiovascular;  Laterality: N/A;  . TONSILLECTOMY    . WISDOM TOOTH EXTRACTION      There were no vitals filed for this visit.   Subjective Assessment - 04/20/20 1020    Subjective  caregiver reports that pt is having more spasms in RUE and RLE    Patient is accompanied by: Family member    Pertinent History Lt MCA CVA 01/27/20, readmitted to hospital with HAP and fever on 03/06/20 and released 03/11/20. PMH: paroxsymal A-fib, HTN    Limitations fall    Currently in Pain? No/denies            Began checking STGs and discussing progress--see below.   Sitting closed-chain shoulder flex to mid-range to high range, and abduction to mid-range BUEs with ball with min cueing/facilitation for normal movement patterns.   Sitting, Wt. Bearing through R hand on mat with body on arm movements with min cueing/facilitation.  Picking up 1-inch blocks and placing on area with mid-range reach with min cueing/facilitation for tip pinch and normal  movement patterns with reach.  Gentle wrist mobs/wrist ext stretch followed by working with cylinder object and tilting for isolated radial deviation in prep to drink and wrist ext with forearm neutral for keeping cup upright with min facilitation/cueing.  Shoulder flex to midlevel AROM with cylinder object held upright with min cueing/facilitation for incr wrist control and normal movement patterns for reach  Holding spoon with built-up grip and bringing to mouth with min facilitation for  normal movement patterns and wrist movements for eating followed by simulated scooping from bowl with min facilitation.  Issued tan foam (as red not available at this time) and shown pt/caregiver how to cut and tape to trial at home with yogurt/thicker foods.  Will issue red foam when available.    Encouraged pt to try to don shirt without assist including putting on RUE first.  Pt/caregiver verbalized understanding.       OT Short Term Goals - 04/20/20 1024      OT SHORT TERM GOAL #1   Title Independent with initial HEP    Time 4    Period Weeks    Status On-going      OT SHORT TERM GOAL #2   Title Pt to perform low to mid level reaching of light objects RUE in prep for feeding self and bathing    Time 4    Period Weeks    Status Achieved   04/20/20     OT SHORT TERM GOAL #3   Title Pt to feed self 50% of the time with Rt dominant hand and A/E prn    Time 4    Period Weeks    Status On-going   04/20/20: able to eat finger foods     OT SHORT TERM GOAL #4   Title Pt to perform UE dressing of pullover shirt mod I level    Time 4    Period Weeks    Status On-going   04/20/20:  min A, encouraged pt to try at home     OT [place] #5   Title Pt to perform LE dressing and all bathing with no more than mod assist using A/E and strategies prn    Time 4    Period Weeks    Status Achieved   04/20/20:  set-up for bedside bath, mod A for LE dressing     OT SHORT TERM GOAL #6   Title Pt to perform toileting and toilet transfers with no more than min assist overall    Time 4    Period Weeks    Status New      OT SHORT TERM GOAL #7   Title Improve functional use of RUE as evidenced by performing 8 blocks on Box & Blocks test    Baseline 0    Time 4    Period Weeks    Status On-going   04/20/20:  6 blocks            OT Long Term Goals - 03/23/20 1346      OT LONG TERM GOAL #1   Title Pt to be independent with updated HEP    Time 12    Period Weeks    Status New      OT  LONG TERM GOAL #2   Title Pt to perform all BADLS at mod I level    Time 12    Period Weeks    Status New      OT LONG TERM GOAL #3  Title Pt to feed self 75% or greater with Rt dominant hand and grooming 50% or more with Rt hand    Time 12    Period Weeks    Status New      OT LONG TERM GOAL #4   Title Pt to retrieve light weight objects from high shelf RUE consistently    Time 12    Period Weeks    Status New      OT LONG TERM GOAL #5   Title Pt to demo 25 lbs grip strength or more Rt dominant hand for gripping, opening jars/containers    Time 12    Period Weeks    Status New      Long Term Additional Goals   Additional Long Term Goals Yes      OT LONG TERM GOAL #6   Title Pt to perform simple meal prep and light cleaning tasks mod I level    Time 12    Period Weeks    Status New      OT LONG TERM GOAL #7   Title Improve functional use RUE as evidenced by performing 25 blocks on Box & Blocks and performing 9 hole peg test in 90 sec or under    Time 12    Period Weeks    Status New      OT LONG TERM GOAL #8   Title Pt to returned to modified art/sculpting tasks with task modifications and A/E prn    Time 12    Period Weeks    Status New                 Plan - 04/20/20 1023    Clinical Impression Statement Pt is progressing towards goals with improved RUE functional use and control, but needs cueing for timing/coordination of movement for normal movement patterns.    OT Occupational Profile and History Detailed Assessment- Review of Records and additional review of physical, cognitive, psychosocial history related to current functional performance    Occupational performance deficits (Please refer to evaluation for details): ADL's;IADL's;Work;Leisure;Social Participation    Body Structure / Function / Physical Skills ADL;ROM;Dexterity;Balance;IADL;Body mechanics;Improper spinal/pelvic alignment;Sensation;Mobility;Flexibility;Strength;Coordination;FMC;Tone;UE  functional use;Decreased knowledge of use of DME;Proprioception    Rehab Potential Good    Clinical Decision Making Several treatment options, min-mod task modification necessary    Comorbidities Affecting Occupational Performance: May have comorbidities impacting occupational performance    Modification or Assistance to Complete Evaluation  Min-Moderate modification of tasks or assist with assess necessary to complete eval    OT Frequency 2x / week    OT Duration 12 weeks   pluse eval   OT Treatment/Interventions Self-care/ADL training;Therapeutic exercise;Functional Mobility Training;Neuromuscular education;Manual Therapy;Splinting;Aquatic Therapy;Manual lymph drainage;Therapeutic activities;Coping strategies training;DME and/or AE instruction;Electrical Stimulation;Fluidtherapy;Passive range of motion;Patient/family education    Plan check remaining goals (work on/address UB dressing goal prn, issue/practice with red foam built up grip for utensils); functional use RUE, NMR    Consulted and Agree with Plan of Care Patient           Patient will benefit from skilled therapeutic intervention in order to improve the following deficits and impairments:   Body Structure / Function / Physical Skills: ADL, ROM, Dexterity, Balance, IADL, Body mechanics, Improper spinal/pelvic alignment, Sensation, Mobility, Flexibility, Strength, Coordination, FMC, Tone, UE functional use, Decreased knowledge of use of DME, Proprioception       Visit Diagnosis: Hemiplegia and hemiparesis following cerebral infarction affecting right dominant side (HCC)  Other lack of  coordination  Unsteadiness on feet  Other symptoms and signs involving the nervous system  Localized edema    Problem List Patient Active Problem List   Diagnosis Date Noted  . Neurogenic bladder 03/17/2020  . Sepsis due to pneumonia ([place]) 03/06/2020  . History of stroke with residual deficit 03/06/2020  . Transient hypotension  03/06/2020  . Spastic hemiplegia affecting nondominant side ([place])   . History of hypertension   . Expressive aphasia   . AKI (acute kidney injury) ([place])   . Global aphasia   . Hypoalbuminemia due to protein-calorie malnutrition ([place])   . Dysphagia, post-stroke   . Hyperlipidemia 02/02/2020  . Dysphagia following cerebral infarction 02/02/2020  . Aspiration pneumonia ([place]) 02/02/2020  . Acute ischemic left MCA stroke ([place]) 02/02/2020  . Acute respiratory failure ([place])   . Acute ischemic stroke [facility]) s/p clot retrieval L MCA & ACA A3 01/27/2020  . Aspiration into airway 01/27/2020  . Chronic anticoagulation 01/27/2020  . Paroxysmal atrial fibrillation ([place]) 06/15/2018  . Essential hypertension 06/15/2018  . NICM (nonischemic cardiomyopathy) ([place]) 06/15/2018  . Visit for monitoring Tikosyn therapy 06/12/2018    [facility] 04/20/2020, 11:47 AM  [facility] [address] [place], [place], [address] Phone: [phone]   Fax:  [phone]  Name: CERVANDO DURNIN MRN: [id number] Date of Birth: 06-23-1956   Vianne Bulls, OTR/L [facility] [address] [place], [place]  [address] [phone] phone [phone] 04/20/20 11:47 AM

## 2020-04-20 NOTE — Therapy (Signed)
[facility] [address] [place], [place], [address] Phone: [phone]   Fax:  [phone]  Physical Therapy Treatment  Patient Details  Name: Dustin Schneider MRN: [id number] Date of Birth: [birth year] Referring Provider (PT): Lauraine Rinne, PA-C (will be followed by Dr. Posey Pronto)   Encounter Date: 04/20/2020   PT End of Session - 04/20/20 1202    Visit Number 11    Number of Visits 25    Date for PT Re-Evaluation 06/14/20    Authorization Type BC[employer]    PT Start Time 0845    PT Stop Time 0930    PT Time Calculation (min) 45 min    Equipment Utilized During Treatment Gait belt    Activity Tolerance Patient tolerated treatment well    Behavior During Therapy [facility] for tasks assessed/performed           Past Medical History:  Diagnosis Date  . Hypertension   . NICM (nonischemic cardiomyopathy) ([place]) 06/15/2018   04/23/18: EF 45%, 09/15/18: NOrmal LVEF  . Paroxysmal atrial fibrillation ([place]) 06/15/2018  . Stroke ([place])   . Visit for monitoring Tikosyn therapy 06/12/2018    Past Surgical History:  Procedure Laterality Date  . BUBBLE STUDY  03/11/2020   Procedure: BUBBLE STUDY;  Surgeon: [name], MD;  Location: [facility];  Service: Cardiovascular;;  . CARDIOVERSION N/A 05/13/2018   Procedure: CARDIOVERSION;  Surgeon: [name], MD;  Location: [facility] ENDOSCOPY;  Service: Cardiovascular;  Laterality: N/A;  . CARDIOVERSION N/A 06/13/2018   Procedure: CARDIOVERSION;  Surgeon: [name], MD;  Location: [facility] ENDOSCOPY;  Service: Cardiovascular;  Laterality: N/A;  . CARDIOVERSION N/A 06/23/2018   Procedure: CARDIOVERSION;  Surgeon: [name], MD;  Location: [facility] ENDOSCOPY;  Service: Cardiovascular;  Laterality: N/A;  . CARDIOVERSION N/A 03/11/2020   Procedure: CARDIOVERSION;  Surgeon: [name], MD;  Location: [facility];  Service: Cardiovascular;  Laterality: N/A;  . excision of actinic keratosis    . EYE SURGERY      eye lift  . IR CT HEAD LTD  01/27/2020  . IR PERCUTANEOUS ART THROMBECTOMY/INFUSION INTRACRANIAL INC DIAG ANGIO  01/27/2020      . IR PERCUTANEOUS ART THROMBECTOMY/INFUSION INTRACRANIAL INC DIAG ANGIO  01/27/2020  . RADIOLOGY WITH ANESTHESIA N/A 01/27/2020   Procedure: IR WITH ANESTHESIA;  Surgeon: Radiologist, Medication, MD;  Location: [facility];  Service: Radiology;  Laterality: N/A;  . TEE WITHOUT CARDIOVERSION N/A 03/11/2020   Procedure: TRANSESOPHAGEAL ECHOCARDIOGRAM (TEE);  Surgeon: [name], MD;  Location: [facility];  Service: Cardiovascular;  Laterality: N/A;  . TONSILLECTOMY    . WISDOM TOOTH EXTRACTION      There were no vitals filed for this visit.   Subjective Assessment - 04/20/20 0848    Subjective Has been having more spasms in both his arms and legs. On Tizanadine for spasticity (started last week). No falls.    Patient is accompained by: Family member    Pertinent History PMH: HTN, paroxysmal a fib    Limitations Standing;Walking    Patient Stated Goals "wants everything working on R side again"    Currently in Pain? No/denies                             [facility] Adult PT Treatment/Exercise - 04/20/20 0001      Transfers   Transfers Sit to Stand;Stand to Sit;Squat Pivot Transfers    Sit to Stand 4: Min guard  Sit to Stand Details Verbal cues for technique    Sit to Stand Details (indicate cue type and reason) cues to perform with a wider BOS in for incr weight bearing through RLE when standing, performed multiple reps throughout session    Stand to Sit 4: Min guard    Stand to Sit Details (indicate cue type and reason) Verbal cues for technique    Stand to Sit Details cues to make sure to remove RUE from sink (when performing standing balance at countertop) prior to sitting     Squat Pivot Transfers 4: Min guard    Squat Pivot Transfer Details (indicate cue type and reason) w/c <> mat table with therapist blocking R knee/ankle for stability       Ambulation/Gait   Pre-Gait Activities standing at RW with single LUE support, therapist donned aircast to R ankle for incr stability in standing: performed weight shifting R/L x10 reps with cues for quad and glute activation when shifting towards R - pt's R ankle maintaining proper alignment. performed same activity at the countertop with holding on with LUE to add for home for weight bearing through RLE, had pt's brother in law demonstrate technique and cues for R quad activation.        Therapeutic Activites    Therapeutic Activities Other Therapeutic Activities    Other Therapeutic Activities showed pt's husband where to purchase aircast from [facility] to use for home use for standing at countertop      Exercises   Exercises Other Exercises    Other Exercises  supine on mat table: hip ADD stretch with RLE 4 x 30-45 second bouts, and performed in hooklying position, then performed bent knee fall outs with RLE 2 x 10 reps, cues for slowed and controlled - added to HEP, attempted with yellow t band resistance but pt unable to perform with resistance. then performed x10 reps isometric hip ABD on R, asking pt to push out into therapist's hand. standing at countertop with single LUE support: x10 reps mini squats with cues for technique and verbal/tactile cues for quad activation, performed an additional x5 reps with 2" block under LLE for incr weight shifting/bearing through RLE, min guard for safety/balance                  PT Education - 04/20/20 1157    Education Details hip ADD strech, bent knee fall outs, standing weight shift at countertop (with use of aircast for ankle stability), provided info to brother in law where to purchase aircast    Person(s) Educated Patient;Caregiver(s)    Methods Explanation;Demonstration;Handout    Comprehension Verbalized understanding;Returned demonstration            PT Short Term Goals - 04/15/20 1202      PT SHORT TERM GOAL #1   Title Pt and pt's  family members will be independent with initial HEP in order to build upon functional gains made in therapy. ALL LTGS DUE 04/13/20    Baseline PT reviewed HEP with patient. Denies any issues and performing with family members at home.    Time 4    Period Weeks    Status Achieved    Target Date 04/13/20      PT SHORT TERM GOAL #2   Title Pt will perform sit > supine with supervision and supine > sit with min guard for bed mobility in order to decr caregiver burden.    Baseline 04/15/20 supervision sit to supine, min assist supine to  sit as did not get right leg off mat    Time 4    Period Weeks    Status Partially Met      PT SHORT TERM GOAL #3   Title Pt will undergo assessment of AFO for functional transfers and gait.    Baseline Orthotist came last sesssion but did not feel pt tone stable enough to pursue brace yet. Will continue to assess need.    Time 4    Period Weeks    Status On-going      PT SHORT TERM GOAL #4   Title Pt will ambulate 62' with RW with mod A x1 in order to improve functional mobility.    Baseline 04/15/20 Mod assist with 2nd person CGA 230' with RW. Ability does vary    Time 4    Period Weeks    Status On-going      PT SHORT TERM GOAL #5   Title Pt will perform sit <> stand with RW vs. Stedy with min A in order to improve functional BLE strength for transfers.    Baseline CGA sit to stand at walker 04/15/20    Time 4    Period Weeks    Status Achieved      PT SHORT TERM GOAL #6   Title Pt will perform squat pivot transfers from w/c <> mat table with min guard.    Baseline CGA w/c to/from mat 04/15/20    Time 4    Period Weeks    Status Achieved          Revised/new STGs for 8 weeks:  PT Short Term Goals - 04/20/20 1217      PT SHORT TERM GOAL #1   Title Pt will perform sit <> stand with RW with supervision in order to demo decr caregiver burden and improved functional BLE strength/balance. ALL STGS DUE 05/18/20    Baseline currently min guard    Time 4     Period Weeks    Status New    Target Date 05/18/20      PT SHORT TERM GOAL #2   Title Pt will perform sit > supine with supervision and supine > sit with min guard for bed mobility in order to decr caregiver burden.    Baseline 04/15/20 supervision sit to supine, min assist supine to sit as did not get right leg off mat    Time 4    Period Weeks    Status On-going      PT SHORT TERM GOAL #3   Title Pt will undergo assessment of AFO for functional transfers and gait.    Baseline Orthotist came last sesssion but did not feel pt tone stable enough to pursue brace yet. Will continue to assess need.    Time 4    Period Weeks    Status On-going      PT SHORT TERM GOAL #4   Title Pt will ambulate 71' with RW with mod A x1 in order to improve functional mobility.    Baseline 04/15/20 Mod assist with 2nd person CGA 230' with RW. Ability does vary    Time 4    Period Weeks    Status Revised      PT SHORT TERM GOAL #6   Title Pt will perform squat pivot transfers from w/c <> mat table with supervision.    Baseline CGA w/c to/from mat 04/15/20    Time 4    Period Weeks    Status  Revised             PT Long Term Goals - 03/16/20 1139      PT LONG TERM GOAL #1   Title Pt and pt's family members will be independent with final HEP in order to build upon functional gains made in therapy. ALL LTGS DUE 06/08/20    Time 12    Period Weeks    Status New    Target Date 06/08/20      PT LONG TERM GOAL #2   Title Pt will ambulate 115' with min guard/min A with RW and R AFO in order to improve household mobility.    Time 12    Period Weeks    Status New      PT LONG TERM GOAL #3   Title Pt will perform sit <> stand with RW with min guard in order to improve functional BLE strength for transfers.    Time 12    Period Weeks    Status New      PT LONG TERM GOAL #4   Title Pt will perform squat pivot transfer with mod I vs. stand pivot transfer with RW with min guard in order to decr  caregiver burden.    Time 12    Period Weeks    Status New                 Plan - 04/20/20 1214    Clinical Impression Statement Focus of today's skilled session was adding hip ABD strengthening/hip ADD stretching to HEP and standing weight shift at countertop. Trialed aircast to pt's R ankle with standing, with pt demonstrating improved ankle stability and alignment. Showed pt's brother in law where to purchase for home use with standing at countertop. Due to STGs being checked last session, revised and updated STGs for 8 weeks. Will continue to progress towards LTGs.    Personal Factors and Comorbidities Comorbidity 3+    Comorbidities dense L MCA CVA, paroxysmal A. fib on chronic Xarelto, nonischemic cardiomyopathy and hypertension.    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, works as an Electrical engineer Evolving/Moderate complexity    Rehab Potential Good    PT Frequency 2x / week    PT Duration 12 weeks    PT Treatment/Interventions ADLs/Self Care Home Management;Aquatic Therapy;DME Instruction;Gait training;Stair training;Functional mobility training;Therapeutic activities;Therapeutic exercise;Electrical Stimulation;Balance training;Neuromuscular re-education;Wheelchair mobility training;Orthotic Fit/Training;Patient/family education;Passive range of motion;Energy conservation    PT Next Visit Plan did the aircast come in? hip ABD/quad strengthening, weight shifting/NMR to RLE. continue gait training with brace, t-strap and hand orthotic; bioness to RLE (ant tib and quad) for strengthening/muscle re-ed    Consulted and Agree with Plan of Care Patient;Family member/caregiver           Patient will benefit from skilled therapeutic intervention in order to improve  the following deficits and impairments:  Abnormal gait, Decreased activity tolerance, Decreased balance, Decreased cognition, Decreased mobility, Decreased coordination, Decreased range of motion, Decreased endurance, Decreased strength, Hypomobility, Difficulty walking, Impaired UE functional use, Impaired vision/preception, Postural dysfunction, Impaired sensation  Visit Diagnosis: Other abnormalities of gait and mobility  Muscle weakness (generalized)  Other lack of coordination  Unsteadiness on feet  Other symptoms and signs involving the nervous system     Problem List Patient Active Problem List   Diagnosis Date Noted  . Neurogenic bladder 03/17/2020  . Sepsis due  to pneumonia ([place]) 03/06/2020  . History of stroke with residual deficit 03/06/2020  . Transient hypotension 03/06/2020  . Spastic hemiplegia affecting nondominant side ([place])   . History of hypertension   . Expressive aphasia   . AKI (acute kidney injury) ([place])   . Global aphasia   . Hypoalbuminemia due to protein-calorie malnutrition ([place])   . Dysphagia, post-stroke   . Hyperlipidemia 02/02/2020  . Dysphagia following cerebral infarction 02/02/2020  . Aspiration pneumonia ([place]) 02/02/2020  . Acute ischemic left MCA stroke ([place]) 02/02/2020  . Acute respiratory failure ([place])   . Acute ischemic stroke [facility]) s/p clot retrieval L MCA & ACA A3 01/27/2020  . Aspiration into airway 01/27/2020  . Chronic anticoagulation 01/27/2020  . Paroxysmal atrial fibrillation ([place]) 06/15/2018  . Essential hypertension 06/15/2018  . NICM (nonischemic cardiomyopathy) ([place]) 06/15/2018  . Visit for monitoring Tikosyn therapy 06/12/2018    Arliss Journey, PT, DPT 04/20/2020, 12:17 PM  [facility] [address] [place], [place], [address] Phone: [phone]   Fax:  [phone]  Name: Dustin Schneider MRN: [id number] Date of Birth: 1956-01-12

## 2020-04-22 ENCOUNTER — Ambulatory Visit: Payer: BC Managed Care – PPO | Admitting: Occupational Therapy

## 2020-04-22 ENCOUNTER — Encounter: Payer: Self-pay | Admitting: Occupational Therapy

## 2020-04-22 ENCOUNTER — Other Ambulatory Visit: Payer: Self-pay

## 2020-04-22 ENCOUNTER — Ambulatory Visit: Payer: BC Managed Care – PPO

## 2020-04-22 DIAGNOSIS — R482 Apraxia: Secondary | ICD-10-CM

## 2020-04-22 DIAGNOSIS — R2689 Other abnormalities of gait and mobility: Secondary | ICD-10-CM

## 2020-04-22 DIAGNOSIS — M6281 Muscle weakness (generalized): Secondary | ICD-10-CM

## 2020-04-22 DIAGNOSIS — R4701 Aphasia: Secondary | ICD-10-CM

## 2020-04-22 DIAGNOSIS — R278 Other lack of coordination: Secondary | ICD-10-CM

## 2020-04-22 DIAGNOSIS — I69351 Hemiplegia and hemiparesis following cerebral infarction affecting right dominant side: Secondary | ICD-10-CM

## 2020-04-22 DIAGNOSIS — R2681 Unsteadiness on feet: Secondary | ICD-10-CM

## 2020-04-22 NOTE — Therapy (Addendum)
[facility] [address] [place], [place], [address] Phone: [phone]   Fax:  [phone]  Occupational Therapy Treatment  Patient Details  Name: Dustin Schneider MRN: [id number] Date of Birth: 12-07-1955 No data recorded  Encounter Date: 04/22/2020   OT End of Session - 04/22/20 0719    Visit Number 9    Number of Visits 25    Date for OT Re-Evaluation 06/23/20    Authorization Type BC/BS - No auth required, covered 100%    OT Start Time 0717    OT Stop Time 0800    OT Time Calculation (min) 43 min    Activity Tolerance Patient tolerated treatment well    Behavior During Therapy [facility] ([place] for tasks assessed/performed           Past Medical History:  Diagnosis Date  . Hypertension   . NICM (nonischemic cardiomyopathy) ([place]) 06/15/2018   04/23/18: EF 45%, 09/15/18: NOrmal LVEF  . Paroxysmal atrial fibrillation ([place]) 06/15/2018  . Stroke ([place])   . Visit for monitoring Tikosyn therapy 06/12/2018    Past Surgical History:  Procedure Laterality Date  . BUBBLE STUDY  03/11/2020   Procedure: BUBBLE STUDY;  Surgeon: [name], MD;  Location: [place];  Service: Cardiovascular;;  . CARDIOVERSION N/A 05/13/2018   Procedure: CARDIOVERSION;  Surgeon: [name], MD;  Location: [facility] ENDOSCOPY;  Service: Cardiovascular;  Laterality: N/A;  . CARDIOVERSION N/A 06/13/2018   Procedure: CARDIOVERSION;  Surgeon: [name], MD;  Location: [facility] ENDOSCOPY;  Service: Cardiovascular;  Laterality: N/A;  . CARDIOVERSION N/A 06/23/2018   Procedure: CARDIOVERSION;  Surgeon: [name], MD;  Location: [facility] ENDOSCOPY;  Service: Cardiovascular;  Laterality: N/A;  . CARDIOVERSION N/A 03/11/2020   Procedure: CARDIOVERSION;  Surgeon: [name], MD;  Location: [place];  Service: Cardiovascular;  Laterality: N/A;  . excision of actinic keratosis    . EYE SURGERY     eye lift  . IR CT HEAD LTD  01/27/2020  . IR PERCUTANEOUS ART  THROMBECTOMY/INFUSION INTRACRANIAL INC DIAG ANGIO  01/27/2020      . IR PERCUTANEOUS ART THROMBECTOMY/INFUSION INTRACRANIAL INC DIAG ANGIO  01/27/2020  . RADIOLOGY WITH ANESTHESIA N/A 01/27/2020   Procedure: IR WITH ANESTHESIA;  Surgeon: Radiologist, Medication, MD;  Location: [place];  Service: Radiology;  Laterality: N/A;  . TEE WITHOUT CARDIOVERSION N/A 03/11/2020   Procedure: TRANSESOPHAGEAL ECHOCARDIOGRAM (TEE);  Surgeon: [name], MD;  Location: [place];  Service: Cardiovascular;  Laterality: N/A;  . TONSILLECTOMY    . WISDOM TOOTH EXTRACTION      There were no vitals filed for this visit.   Subjective Assessment - 04/22/20 0719    Subjective  Pt denies any pain today    Patient is accompanied by: Family member    Pertinent History Lt MCA CVA 01/27/20, readmitted to hospital with HAP and fever on 03/06/20 and released 03/11/20. PMH: paroxsymal A-fib, HTN    Limitations fall    Currently in Pain? No/denies              Treatment: Cylindrical grasp/release of pegs with mod assistance for placing in peg board. Mid level /table height   UB dressing simulation - Completed with min A and moderate verbal cues for adapted strategies and bunching up sleeve for donning RUE first. Patient recalls strategies for donning weaker side first but required cues for strategies for opening shirt and bunching sleeve and pulling sleeve above elbow before moving to other side  Grasp/release with cones -  placing at mid height with min A and self-stabilizing with non dominant hand for grasp. Removed cones and placed at varying heights and stacking  Supine exercises for shoulder flexion with paper towel roll and pvc pipe frame. Patient with difficulty with proprioception and grasp with RUE required min A for stabilization. Mod verbal and tactile cues for eccentric control.  Squat Pivot transfer this day with min A and verbal cues for impulsivity and taking time.  Weight bearing Edge of mat with RUE  with D1 Shoulder pattern with LUE for increased weight shifts to right.        OT Education - 04/22/20 0902    Education Details Education provided on weight bearing positioning and modifications for continuing this HEP.    Person(s) Educated Patient;Caregiver(s)    Methods Explanation;Demonstration;Verbal cues;Tactile cues    Comprehension Verbalized understanding;Returned demonstration;Verbal cues required;Tactile cues required            OT Short Term Goals - 04/22/20 0810      OT SHORT TERM GOAL #1   Title Independent with initial HEP    Time 4    Period Weeks    Status On-going      OT SHORT TERM GOAL #2   Title Pt to perform low to mid level reaching of light objects RUE in prep for feeding self and bathing    Time 4    Period Weeks    Status Achieved   04/20/20     OT SHORT TERM GOAL #3   Title Pt to feed self 50% of the time with Rt dominant hand and A/E prn    Time 4    Period Weeks    Status On-going   04/20/20: able to eat finger foods     OT SHORT TERM GOAL #4   Title Pt to perform UE dressing of pullover shirt mod I level    Time 4    Period Weeks    Status On-going   04/20/20:  min A, encouraged pt to try at home; 04/22/20: min A with verbal cues for strategies and getting shirt passed elbow     OT SHORT TERM GOAL #5   Title Pt to perform LE dressing and all bathing with no more than mod assist using A/E and strategies prn    Time 4    Period Weeks    Status Achieved   04/20/20:  set-up for bedside bath, mod A for LE dressing     OT SHORT TERM GOAL #6   Title Pt to perform toileting and toilet transfers with no more than min assist overall    Time 4    Period Weeks    Status New      OT SHORT TERM GOAL #7   Title Improve functional use of RUE as evidenced by performing 8 blocks on Box & Blocks test    Baseline 0    Time 4    Period Weeks    Status On-going   04/20/20:  6 blocks            OT Long Term Goals - 03/23/20 1346      OT LONG TERM GOAL  #1   Title Pt to be independent with updated HEP    Time 12    Period Weeks    Status New      OT LONG TERM GOAL #2   Title Pt to perform all BADLS at mod I level    Time 12  Period Weeks    Status New      OT LONG TERM GOAL #3   Title Pt to feed self 75% or greater with Rt dominant hand and grooming 50% or more with Rt hand    Time 12    Period Weeks    Status New      OT LONG TERM GOAL #4   Title Pt to retrieve light weight objects from high shelf RUE consistently    Time 12    Period Weeks    Status New      OT LONG TERM GOAL #5   Title Pt to demo 25 lbs grip strength or more Rt dominant hand for gripping, opening jars/containers    Time 12    Period Weeks    Status New      Long Term Additional Goals   Additional Long Term Goals Yes      OT LONG TERM GOAL #6   Title Pt to perform simple meal prep and light cleaning tasks mod I level    Time 12    Period Weeks    Status New      OT LONG TERM GOAL #7   Title Improve functional use RUE as evidenced by performing 25 blocks on Box & Blocks and performing 9 hole peg test in 90 sec or under    Time 12    Period Weeks    Status New      OT LONG TERM GOAL #8   Title Pt to returned to modified art/sculpting tasks with task modifications and A/E prn    Time 12    Period Weeks    Status New                 Plan - 04/22/20 0909    Clinical Impression Statement Pt demonstrated improved movement with RUE and functional use but continues to demonstrate difficulty with control and proprioception.    OT Occupational Profile and History Detailed Assessment- Review of Records and additional review of physical, cognitive, psychosocial history related to current functional performance    Occupational performance deficits (Please refer to evaluation for details): ADL's;IADL's;Work;Leisure;Social Participation    Body Structure / Function / Physical Skills ADL;ROM;Dexterity;Balance;IADL;Body mechanics;Improper  spinal/pelvic alignment;Sensation;Mobility;Flexibility;Strength;Coordination;FMC;Tone;UE functional use;Decreased knowledge of use of DME;Proprioception    Rehab Potential Good    Clinical Decision Making Several treatment options, min-mod task modification necessary    Comorbidities Affecting Occupational Performance: May have comorbidities impacting occupational performance    Modification or Assistance to Complete Evaluation  Min-Moderate modification of tasks or assist with assess necessary to complete eval    OT Frequency 2x / week    OT Duration 12 weeks   pluse eval   OT Treatment/Interventions Self-care/ADL training;Therapeutic exercise;Functional Mobility Training;Neuromuscular education;Manual Therapy;Splinting;Aquatic Therapy;Manual lymph drainage;Therapeutic activities;Coping strategies training;DME and/or AE instruction;Electrical Stimulation;Fluidtherapy;Passive range of motion;Patient/family education    Plan issue/practice with red foam built up grip for utensils; functional use RUE, NMR    Consulted and Agree with Plan of Care Patient           Patient will benefit from skilled therapeutic intervention in order to improve the following deficits and impairments:   Body Structure / Function / Physical Skills: ADL, ROM, Dexterity, Balance, IADL, Body mechanics, Improper spinal/pelvic alignment, Sensation, Mobility, Flexibility, Strength, Coordination, FMC, Tone, UE functional use, Decreased knowledge of use of DME, Proprioception       Visit Diagnosis: Hemiplegia and hemiparesis following cerebral infarction affecting right dominant side (  [place])  Other lack of coordination  Unsteadiness on feet  Muscle weakness (generalized)    Problem List Patient Active Problem List   Diagnosis Date Noted  . Neurogenic bladder 03/17/2020  . Sepsis due to pneumonia ([place]) 03/06/2020  . History of stroke with residual deficit 03/06/2020  . Transient hypotension 03/06/2020  . Spastic  hemiplegia affecting nondominant side ([place])   . History of hypertension   . Expressive aphasia   . AKI (acute kidney injury) ([place])   . Global aphasia   . Hypoalbuminemia due to protein-calorie malnutrition ([place])   . Dysphagia, post-stroke   . Hyperlipidemia 02/02/2020  . Dysphagia following cerebral infarction 02/02/2020  . Aspiration pneumonia ([place]) 02/02/2020  . Acute ischemic left MCA stroke ([place]) 02/02/2020  . Acute respiratory failure ([place])   . Acute ischemic stroke [facility]) s/p clot retrieval L MCA & ACA A3 01/27/2020  . Aspiration into airway 01/27/2020  . Chronic anticoagulation 01/27/2020  . Paroxysmal atrial fibrillation ([place]) 06/15/2018  . Essential hypertension 06/15/2018  . NICM (nonischemic cardiomyopathy) ([place]) 06/15/2018  . Visit for monitoring Tikosyn therapy 06/12/2018    Zachery Conch MOT, OTR/L  04/22/2020, [facility] [address] [place], [place], [address] Phone: [phone]   Fax:  [phone]  Name: Dustin Schneider MRN: [id number] Date of Birth: 08/04/1956

## 2020-04-22 NOTE — Therapy (Signed)
[facility] [address] [place], [place], [address] Phone: [phone]   Fax:  [phone]  Speech Language Pathology Treatment  Patient Details  Name: Dustin Schneider MRN: [id number] Date of Birth: [birth year] Referring Provider (SLP): Dr. Delice Lesch   Encounter Date: 04/22/2020   End of Session - 04/22/20 1823    Visit Number 9    Number of Visits 25    Date for SLP Re-Evaluation 06/15/20    SLP Start Time 0850    SLP Stop Time  0930    SLP Time Calculation (min) 40 min    Activity Tolerance Patient tolerated treatment well           Past Medical History:  Diagnosis Date  . Hypertension   . NICM (nonischemic cardiomyopathy) ([place]) 06/15/2018   04/23/18: EF 45%, 09/15/18: NOrmal LVEF  . Paroxysmal atrial fibrillation ([place]) 06/15/2018  . Stroke ([place])   . Visit for monitoring Tikosyn therapy 06/12/2018    Past Surgical History:  Procedure Laterality Date  . BUBBLE STUDY  03/11/2020   Procedure: BUBBLE STUDY;  Surgeon: [name], MD;  Location: [place];  Service: Cardiovascular;;  . CARDIOVERSION N/A 05/13/2018   Procedure: CARDIOVERSION;  Surgeon: [name], MD;  Location: [facility] ENDOSCOPY;  Service: Cardiovascular;  Laterality: N/A;  . CARDIOVERSION N/A 06/13/2018   Procedure: CARDIOVERSION;  Surgeon: [name], MD;  Location: [facility] ENDOSCOPY;  Service: Cardiovascular;  Laterality: N/A;  . CARDIOVERSION N/A 06/23/2018   Procedure: CARDIOVERSION;  Surgeon: [name], MD;  Location: [facility] ENDOSCOPY;  Service: Cardiovascular;  Laterality: N/A;  . CARDIOVERSION N/A 03/11/2020   Procedure: CARDIOVERSION;  Surgeon: [name], MD;  Location: [place];  Service: Cardiovascular;  Laterality: N/A;  . excision of actinic keratosis    . EYE SURGERY     eye lift  . IR CT HEAD LTD  01/27/2020  . IR PERCUTANEOUS ART THROMBECTOMY/INFUSION INTRACRANIAL INC DIAG ANGIO  01/27/2020      . IR PERCUTANEOUS ART  THROMBECTOMY/INFUSION INTRACRANIAL INC DIAG ANGIO  01/27/2020  . RADIOLOGY WITH ANESTHESIA N/A 01/27/2020   Procedure: IR WITH ANESTHESIA;  Surgeon: Radiologist, Medication, MD;  Location: [place];  Service: Radiology;  Laterality: N/A;  . TEE WITHOUT CARDIOVERSION N/A 03/11/2020   Procedure: TRANSESOPHAGEAL ECHOCARDIOGRAM (TEE);  Surgeon: [name], MD;  Location: [place];  Service: Cardiovascular;  Laterality: N/A;  . TONSILLECTOMY    . WISDOM TOOTH EXTRACTION      There were no vitals filed for this visit.   Subjective Assessment - 04/22/20 0900    Subjective "How --you?"    Patient is accompained by: Family member   [name] BIL   Currently in Pain? No/denies                 ADULT SLP TREATMENT - 04/22/20 0901      General Information   Behavior/Cognition Alert;Cooperative;Pleasant mood      Treatment Provided   Treatment provided Cognitive-Linquistic      Cognitive-Linquistic Treatment   Treatment focused on Aphasia;Apraxia    Skilled Treatment BIL asked SLP about pt answering "yah" all the time. SLP explained, after asking pt ~15 yes/no questions which pt answered with 90%+ accuracy. SLP educated pt/BIL about aphasic response with minimal pairs, which is what this is. SLP told BIL to take pt's gesture response as accurate, as BIL stated pt's gesture (head nod/shake) appears to be more accurate. SLP worked with pt with /t/ initial as this was probematic for  the patient last week and with naming pt produced /r/ initial correctly in 6/7 words (TV was incorrect and still incorrect with max cues). Pt named 8 items in simple category with rare min A for correct artic,       Assessment / Recommendations / [place] with current plan of care      Progression Toward Goals   Progression toward goals Progressing toward goals            SLP Education - 04/22/20 1823    Education Details use gesture for yes/no response likely more accurate than verbal yes/no     Person(s) Educated Patient;Caregiver(s)    Methods Explanation    Comprehension Verbalized understanding            SLP Short Term Goals - 04/22/20 1824      SLP SHORT TERM GOAL #1   Title Pt will name family, friends and art items with occasional min A 8/10 with approximations allowed.    Time 3    Period Weeks    Status On-going      SLP SHORT TERM GOAL #2   Title Pt will name 7 items in personally relevant categories with occasional min A over 2 sessions    Time 3    Period Weeks    Status On-going      SLP SHORT TERM GOAL #3   Title Pt will perform 4 automatic speech tasks with rare min A.    Time 3    Period Weeks    Status On-going      SLP SHORT TERM GOAL #4   Title Pt will write personal information (name/address/phone), 5 family/friend/pet names and 7  professional words with occasional min A    Time 4    Period Weeks    Status On-going      SLP SHORT TERM GOAL #5   Title Pt will use mulitmodal communication (gesture, draw, write 1st letter etc) to augment verbal expression to meet needs at home with rare min A from family.    Time 3    Period Weeks    Status On-going            SLP Long Term Goals - 04/22/20 1824      SLP LONG TERM GOAL #1   Title Pt will verbalize 3 words to explain or describe his artwork and hobbies with occasional min A over 3 sessions, allowing for intellgible approximations    Time 9    Period Weeks    Status On-going      SLP LONG TERM GOAL #2   Title Pt and family will utilize multimodal compensations for aphasia and verbal apraxia to participate in 3 turns each of conversation with occasional min A over 2 sessions    Time 9    Period Weeks    Status On-going      SLP LONG TERM GOAL #3   Title Pt will write 3 word phrases with less than 2 errors with rare min A over 3 sessions    Time 9    Period Weeks    Status On-going      SLP LONG TERM GOAL #4   Title Reading assessment if indicated    Time 4    Period Weeks     Status On-going            Plan - 04/22/20 1824    Clinical Impression Statement Pt presents today with cont'd mod  Broca's type aphasia and mod-severe verbal apraxia. SLP See "skilled intervention" for more details. Naming was 90% successful with writing given extra time, at word level. I cont to recommend skilled ST to maximize communication for QOL, safety to reduce family burden.    Speech Therapy Frequency 2x / week    Duration --   12 weeks or 25 visits   Treatment/Interventions Environmental controls;Cueing hierarchy;SLP instruction and feedback;Compensatory strategies;Functional tasks;Cognitive reorganization;Compensatory techniques;Internal/external aids;Multimodal communcation approach;Patient/family education;Language facilitation    Potential Considerations Severity of impairments           Patient will benefit from skilled therapeutic intervention in order to improve the following deficits and impairments:   Aphasia  Verbal apraxia    Problem List Patient Active Problem List   Diagnosis Date Noted  . Neurogenic bladder 03/17/2020  . Sepsis due to pneumonia ([place]) 03/06/2020  . History of stroke with residual deficit 03/06/2020  . Transient hypotension 03/06/2020  . Spastic hemiplegia affecting nondominant side ([place])   . History of hypertension   . Expressive aphasia   . AKI (acute kidney injury) ([place])   . Global aphasia   . Hypoalbuminemia due to protein-calorie malnutrition ([place])   . Dysphagia, post-stroke   . Hyperlipidemia 02/02/2020  . Dysphagia following cerebral infarction 02/02/2020  . Aspiration pneumonia ([place]) 02/02/2020  . Acute ischemic left MCA stroke ([place]) 02/02/2020  . Acute respiratory failure ([place])   . Acute ischemic stroke [facility]) s/p clot retrieval L MCA & ACA A3 01/27/2020  . Aspiration into airway 01/27/2020  . Chronic anticoagulation 01/27/2020  . Paroxysmal atrial fibrillation ([place]) 06/15/2018  . Essential hypertension 06/15/2018  . NICM  (nonischemic cardiomyopathy) ([place]) 06/15/2018  . Visit for monitoring Tikosyn therapy 06/12/2018    [facility] ,[place], [place]  04/22/2020, 6:25 PM  [facility] [address] [place], [place], [address] Phone: [phone]   Fax:  [phone]   Name: Dustin Schneider MRN: [id number] Date of Birth: September 03, 1955

## 2020-04-22 NOTE — Therapy (Signed)
[facility] [address] [name], [place], [address] Phone: [phone]   Fax:  [phone]  Physical Therapy Treatment  Patient Details  Name: Dustin Schneider MRN: [id number] Date of Birth: 04/15/1956 Referring Provider (PT): Lauraine Rinne, PA-C (will be followed by Dr. Posey Pronto)   Encounter Date: 04/22/2020   PT End of Session - 04/22/20 0805    Visit Number 12    Number of Visits 25    Date for PT Re-Evaluation 06/14/20    Authorization Type BCBS    PT Start Time 0804    PT Stop Time 0846    PT Time Calculation (min) 42 min    Equipment Utilized During Treatment Gait belt    Activity Tolerance Patient tolerated treatment well    Behavior During Therapy [facility] for tasks assessed/performed           Past Medical History:  Diagnosis Date  . Hypertension   . NICM (nonischemic cardiomyopathy) ([place]) 06/15/2018   04/23/18: EF 45%, 09/15/18: NOrmal LVEF  . Paroxysmal atrial fibrillation ([place]) 06/15/2018  . Stroke ([place])   . Visit for monitoring Tikosyn therapy 06/12/2018    Past Surgical History:  Procedure Laterality Date  . BUBBLE STUDY  03/11/2020   Procedure: BUBBLE STUDY;  Surgeon: [name], MD;  Location: [facility];  Service: Cardiovascular;;  . CARDIOVERSION N/A 05/13/2018   Procedure: CARDIOVERSION;  Surgeon: [name], MD;  Location: [facility] ENDOSCOPY;  Service: Cardiovascular;  Laterality: N/A;  . CARDIOVERSION N/A 06/13/2018   Procedure: CARDIOVERSION;  Surgeon: [name], MD;  Location: [facility] ENDOSCOPY;  Service: Cardiovascular;  Laterality: N/A;  . CARDIOVERSION N/A 06/23/2018   Procedure: CARDIOVERSION;  Surgeon: [name], MD;  Location: [facility] ENDOSCOPY;  Service: Cardiovascular;  Laterality: N/A;  . CARDIOVERSION N/A 03/11/2020   Procedure: CARDIOVERSION;  Surgeon: [name], MD;  Location: [facility];  Service: Cardiovascular;  Laterality: N/A;  . excision of actinic keratosis    . EYE SURGERY      eye lift  . IR CT HEAD LTD  01/27/2020  . IR PERCUTANEOUS ART THROMBECTOMY/INFUSION INTRACRANIAL INC DIAG ANGIO  01/27/2020      . IR PERCUTANEOUS ART THROMBECTOMY/INFUSION INTRACRANIAL INC DIAG ANGIO  01/27/2020  . RADIOLOGY WITH ANESTHESIA N/A 01/27/2020   Procedure: IR WITH ANESTHESIA;  Surgeon: Radiologist, Medication, MD;  Location: [facility];  Service: Radiology;  Laterality: N/A;  . TEE WITHOUT CARDIOVERSION N/A 03/11/2020   Procedure: TRANSESOPHAGEAL ECHOCARDIOGRAM (TEE);  Surgeon: [name], MD;  Location: [facility];  Service: Cardiovascular;  Laterality: N/A;  . TONSILLECTOMY    . WISDOM TOOTH EXTRACTION      There were no vitals filed for this visit.   Subjective Assessment - 04/22/20 0806    Subjective [name] air caste arrives today.    Patient is accompained by: Family member    Pertinent History PMH: HTN, paroxysmal a fib    Limitations Standing;Walking    Patient Stated Goals "wants everything working on R side again"    Currently in Pain? No/denies                             [facility] Adult PT Treatment/Exercise - 04/22/20 0806      Transfers   Transfers Sit to Stand;Stand to Sit;Squat Pivot Transfers    Sit to Stand 4: Min guard    Sit to Stand Details Verbal cues for technique    Sit to Stand Details (  indicate cue type and reason) Verbal cues to push from mat     Stand to Sit 4: Min guard    Stand to Sit Details (indicate cue type and reason) Verbal cues for technique    Stand to Sit Details Verbal cues to remove right hand from walker prior to sitting. When turning to sit on mat after walk cued to step right leg back and not slide and scoot on left foot.    Squat Pivot Transfers 4: Min guard    Squat Pivot Transfer Details (indicate cue type and reason) mat to w/c with AFO donned on right      Ambulation/Gait   Ambulation/Gait Yes    Ambulation/Gait Assistance 4: Min assist    Ambulation/Gait Assistance Details First half PT providing min assist at  RLE for proper foot placement and tactile cues to tighten right quad during stance. Also verbal cues to step past right foot with left foot. Rehab tech at trunk holding gait belt for safety mostly CGA. During 2nd bout PT let pt advance RLE on own with only a couple times needing min assist towards end as fatigued and started to cross over some. Pt has decreased right stance time with increased knee flexion at times. Did improve some with cuing    Ambulation Distance (Feet) 230 Feet    Assistive device Rolling walker   right hand grip, right anteriot ottobock AFO with t-strap   Gait Pattern Step-to pattern;Step-through pattern;Decreased stance time - right;Decreased step length - right;Decreased step length - left;Right flexed knee in stance    Ambulation Surface Level;Indoor    Pre-Gait Activities Standing at walker: Stepping forward and back with LLE x 10 with PT providing tactile cues to tighten right quad and gluts to facilitate right weight shift. Mini-squats at walker 10 x 2 with tactile cues at right knee to facilitate quad contraction and weight shift right.      Exercises   Exercises Other Exercises;Knee/Hip    Other Exercises  Supine on mat: in hooklying isometric hip abd/add x  5 each side with 5 sec holds. Right SAQ over bolster with 2# ankle weight 10 x 2 with 5 sec holds at top and verbal cues to control descent.      Knee/Hip Exercises: Aerobic   Other Aerobic Sci-Fit x 4 min at level 3 legs only with wedges behind w/c and PT stabilizing w/c as well. Verbal cues to really push through right leg. Able to keep hip in more neutral position throughout.                  PT Education - 04/22/20 0857    Education Details Discussed working on standing when they receive air caste with family assist. Discussed adding in isometric hooklying right hip abd/adduction. Brother-in-law observed throughout. Pt was not noted to be tight in hip adductors today with good range so switched to this  versus stretching to work more on timing on contraction.    Person(s) Educated Patient;Caregiver(s)    Methods Explanation;Demonstration    Comprehension Verbalized understanding            PT Short Term Goals - 04/20/20 1217      PT SHORT TERM GOAL #1   Title Pt will perform sit <> stand with RW with supervision in order to demo decr caregiver burden and improved functional BLE strength/balance. ALL STGS DUE 05/18/20    Baseline currently min guard    Time 4    Period Weeks  Status New    Target Date 05/18/20      PT SHORT TERM GOAL #2   Title Pt will perform sit > supine with supervision and supine > sit with min guard for bed mobility in order to decr caregiver burden.    Baseline 04/15/20 supervision sit to supine, min assist supine to sit as did not get right leg off mat    Time 4    Period Weeks    Status On-going      PT SHORT TERM GOAL #3   Title Pt will undergo assessment of AFO for functional transfers and gait.    Baseline Orthotist came last sesssion but did not feel pt tone stable enough to pursue brace yet. Will continue to assess need.    Time 4    Period Weeks    Status On-going      PT SHORT TERM GOAL #4   Title Pt will ambulate 10' with RW with mod A x1 in order to improve functional mobility.    Baseline 04/15/20 Mod assist with 2nd person CGA 230' with RW. Ability does vary    Time 4    Period Weeks    Status Revised      PT SHORT TERM GOAL #6   Title Pt will perform squat pivot transfers from w/c <> mat table with supervision.    Baseline CGA w/c to/from mat 04/15/20    Time 4    Period Weeks    Status Revised             PT Long Term Goals - 03/16/20 1139      PT LONG TERM GOAL #1   Title Pt and pt's family members will be independent with final HEP in order to build upon functional gains made in therapy. ALL LTGS DUE 06/08/20    Time 12    Period Weeks    Status New    Target Date 06/08/20      PT LONG TERM GOAL #2   Title Pt will  ambulate 115' with min guard/min A with RW and R AFO in order to improve household mobility.    Time 12    Period Weeks    Status New      PT LONG TERM GOAL #3   Title Pt will perform sit <> stand with RW with min guard in order to improve functional BLE strength for transfers.    Time 12    Period Weeks    Status New      PT LONG TERM GOAL #4   Title Pt will perform squat pivot transfer with mod I vs. stand pivot transfer with RW with min guard in order to decr caregiver burden.    Time 12    Period Weeks    Status New                 Plan - 04/22/20 4008    Clinical Impression Statement Pt showed improved right leg advancement with gait today with more hip flexion. Did not need to utilize shoe cover. Pt was able to advance leg on own for 1 full lap. Still needing cues to increase right quad contraction during stance. Started on Sci-Fit and pt did well with being able to keep RLE hip in neutral throughout.    Personal Factors and Comorbidities Comorbidity 3+    Comorbidities dense L MCA CVA, paroxysmal A. fib on chronic Xarelto, nonischemic cardiomyopathy and hypertension.    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, works as an Electrical engineer Evolving/Moderate complexity    Rehab Potential Good    PT Frequency 2x / week    PT Duration 12 weeks    PT Treatment/Interventions ADLs/Self Care Home Management;Aquatic Therapy;DME Instruction;Gait training;Stair training;Functional mobility training;Therapeutic activities;Therapeutic exercise;Electrical Stimulation;Balance training;Neuromuscular re-education;Wheelchair mobility training;Orthotic Fit/Training;Patient/family education;Passive range of motion;Energy conservation    PT Next Visit Plan Air Caste was supposed  to be arriving day of last visit. How did that go over the weekend? hip ABD/quad strengthening, weight shifting/NMR to RLE. continue gait training with brace, t-strap and hand orthotic; We made need to get [name] back soon to assess again for AFO? bioness to RLE (ant tib and quad) for strengthening/muscle re-ed    Consulted and Agree with Plan of Care Patient;Family member/caregiver           Patient will benefit from skilled therapeutic intervention in order to improve the following deficits and impairments:  Abnormal gait, Decreased activity tolerance, Decreased balance, Decreased cognition, Decreased mobility, Decreased coordination, Decreased range of motion, Decreased endurance, Decreased strength, Hypomobility, Difficulty walking, Impaired UE functional use, Impaired vision/preception, Postural dysfunction, Impaired sensation  Visit Diagnosis: Other abnormalities of gait and mobility  Muscle weakness (generalized)  Hemiplegia and hemiparesis following cerebral infarction affecting right dominant side [facility])     Problem List Patient Active Problem List   Diagnosis Date Noted  . Neurogenic bladder 03/17/2020  . Sepsis due to pneumonia ([place]) 03/06/2020  . History of stroke with residual deficit 03/06/2020  . Transient hypotension 03/06/2020  . Spastic hemiplegia affecting nondominant side ([place])   . History of hypertension   . Expressive aphasia   . AKI (acute kidney injury) ([place])   . Global aphasia   . Hypoalbuminemia due to protein-calorie malnutrition ([place])   . Dysphagia, post-stroke   . Hyperlipidemia 02/02/2020  . Dysphagia following cerebral infarction 02/02/2020  . Aspiration pneumonia ([place]) 02/02/2020  . Acute ischemic left MCA stroke ([place]) 02/02/2020  . Acute respiratory failure ([place])   . Acute ischemic stroke [facility]) s/p clot retrieval L MCA & ACA A3 01/27/2020  . Aspiration into airway 01/27/2020  . Chronic anticoagulation 01/27/2020  . Paroxysmal atrial fibrillation  ([place]) 06/15/2018  . Essential hypertension 06/15/2018  . NICM (nonischemic cardiomyopathy) ([place]) 06/15/2018  . Visit for monitoring Tikosyn therapy 06/12/2018    Electa Sniff, PT, DPT, NCS 04/22/2020, 9:02 AM  [facility] [address] [place], [place], [address] Phone: [phone]   Fax:  [phone]  Name: Dustin Schneider MRN: [id number] Date of Birth: Jun 07, 1956

## 2020-04-25 ENCOUNTER — Other Ambulatory Visit: Payer: Self-pay

## 2020-04-25 ENCOUNTER — Ambulatory Visit: Payer: BC Managed Care – PPO | Admitting: Occupational Therapy

## 2020-04-25 ENCOUNTER — Encounter: Payer: Self-pay | Admitting: Speech Pathology

## 2020-04-25 ENCOUNTER — Ambulatory Visit: Payer: BC Managed Care – PPO | Admitting: Speech Pathology

## 2020-04-25 ENCOUNTER — Ambulatory Visit: Payer: BC Managed Care – PPO | Admitting: Physical Therapy

## 2020-04-25 DIAGNOSIS — R482 Apraxia: Secondary | ICD-10-CM

## 2020-04-25 DIAGNOSIS — R278 Other lack of coordination: Secondary | ICD-10-CM

## 2020-04-25 DIAGNOSIS — R2689 Other abnormalities of gait and mobility: Secondary | ICD-10-CM

## 2020-04-25 DIAGNOSIS — I69351 Hemiplegia and hemiparesis following cerebral infarction affecting right dominant side: Secondary | ICD-10-CM

## 2020-04-25 DIAGNOSIS — M6281 Muscle weakness (generalized): Secondary | ICD-10-CM

## 2020-04-25 DIAGNOSIS — R4701 Aphasia: Secondary | ICD-10-CM

## 2020-04-25 DIAGNOSIS — R2681 Unsteadiness on feet: Secondary | ICD-10-CM

## 2020-04-25 NOTE — Patient Instructions (Signed)
  Things to do with Rt hand:   1) Help bathe some with Rt hand using a loofah or bath mitt 2) Eat some with Rt hand including finger foods and foods that don't fall off utensil using red or tan foam 3) Help pull up pants or shirt sleeve with Rt hand 4) Open drawers and lower cabinets w/ Rt hand  Spend 15-20 minutes a day doing repetitive things including:   1) Picking up water bottle and bringing to mouth and back down to table (fully release before drawing hand back) x 10 2) Stacking and unstacking narrow plastic cups 3) Flipping over coasters or large cards (flash cards or jumbo cards) x 20 4) Picking up plastic bottle caps and placing in bowl or tall cup

## 2020-04-25 NOTE — Patient Instructions (Addendum)
  Repeat slowly - family can model them (slowly), then mouth them with you   I drink an E  Spress  O in the morning  I eat Yo  Gurt with Blue  Berries  Ronalee Belts is sailing in [place] is a Cur  A  Tor at [employer]  Dr. Leo Rod aphasia professor at [facility]  Email her and let her know [name] may be interested in any programs she has coming up  [email or web address]  Touch Talk or Mini Talk by Anders Simmonds 9-1-1 at [phone] and let them know you have aphasia and may not be able to communicate easily in an emergency

## 2020-04-25 NOTE — Therapy (Signed)
[facility] [address] [place], [place], [address] Phone: [phone]   Fax:  [phone]  Occupational Therapy Treatment  Patient Details  Name: Dustin Schneider MRN: [id number] Date of Birth: 03-17-1956 No data recorded  Encounter Date: 04/25/2020   OT End of Session - 04/25/20 1359    Visit Number 10    Number of Visits 25    Date for OT Re-Evaluation 06/23/20    Authorization Type BC/BS - No auth required, covered 100%    OT Start Time 0930    OT Stop Time 1015    OT Time Calculation (min) 45 min    Activity Tolerance Patient tolerated treatment well    Behavior During Therapy [facility] for tasks assessed/performed           Past Medical History:  Diagnosis Date  . Hypertension   . NICM (nonischemic cardiomyopathy) ([place]) 06/15/2018   04/23/18: EF 45%, 09/15/18: NOrmal LVEF  . Paroxysmal atrial fibrillation ([place]) 06/15/2018  . Stroke ([place])   . Visit for monitoring Tikosyn therapy 06/12/2018    Past Surgical History:  Procedure Laterality Date  . BUBBLE STUDY  03/11/2020   Procedure: BUBBLE STUDY;  Surgeon: [name], MD;  Location: [place];  Service: Cardiovascular;;  . CARDIOVERSION N/A 05/13/2018   Procedure: CARDIOVERSION;  Surgeon: [name], MD;  Location: [facility] ENDOSCOPY;  Service: Cardiovascular;  Laterality: N/A;  . CARDIOVERSION N/A 06/13/2018   Procedure: CARDIOVERSION;  Surgeon: [name], MD;  Location: [facility] ENDOSCOPY;  Service: Cardiovascular;  Laterality: N/A;  . CARDIOVERSION N/A 06/23/2018   Procedure: CARDIOVERSION;  Surgeon: [name], MD;  Location: [facility] ENDOSCOPY;  Service: Cardiovascular;  Laterality: N/A;  . CARDIOVERSION N/A 03/11/2020   Procedure: CARDIOVERSION;  Surgeon: [name], MD;  Location: [place];  Service: Cardiovascular;  Laterality: N/A;  . excision of actinic keratosis    . EYE SURGERY     eye lift  . IR CT HEAD LTD  01/27/2020  . IR PERCUTANEOUS ART  THROMBECTOMY/INFUSION INTRACRANIAL INC DIAG ANGIO  01/27/2020      . IR PERCUTANEOUS ART THROMBECTOMY/INFUSION INTRACRANIAL INC DIAG ANGIO  01/27/2020  . RADIOLOGY WITH ANESTHESIA N/A 01/27/2020   Procedure: IR WITH ANESTHESIA;  Surgeon: Radiologist, Medication, MD;  Location: [place];  Service: Radiology;  Laterality: N/A;  . TEE WITHOUT CARDIOVERSION N/A 03/11/2020   Procedure: TRANSESOPHAGEAL ECHOCARDIOGRAM (TEE);  Surgeon: [name], MD;  Location: [facility];  Service: Cardiovascular;  Laterality: N/A;  . TONSILLECTOMY    . WISDOM TOOTH EXTRACTION      There were no vitals filed for this visit.   Subjective Assessment - 04/25/20 1004    Subjective  Pt denies any pain today    Patient is accompanied by: Family member    Pertinent History Lt MCA CVA 01/27/20, readmitted to hospital with HAP and fever on 03/06/20 and released 03/11/20. PMH: paroxsymal A-fib, HTN    Limitations fall    Currently in Pain? No/denies           Pt issued red foam and practiced simulated eating w/ Rt hand w/ max difficulty and drops. Tried several different grasps around spoon.  Discussed ways to use Rt hand in self care including assist to bathe, eating fingerfoods, pulling up sleeve of shirt or pants w/ Rt hand as able.  Also practiced thumb palmer abd/add in prep for grasping fully around cylindrical objects - pt able to do in gravity elim plane but not with object to  hold onto as pt would also go into finger extension w/ palmer abd and could not isolate to hold object.  Practiced picking up plastic bottle and releasing x 10 for functional movement, followed by picking up plastic bottle top and placing in bowl. Pt issued handout for activities to do with Rt hand while seated.                      OT Education - 04/25/20 1009    Education Details safe, functional activties to do with Rt hand    Person(s) Educated Patient;Caregiver(s)    Methods Explanation;Demonstration;Verbal cues;Tactile  cues    Comprehension Verbalized understanding;Returned demonstration;Verbal cues required;Tactile cues required            OT Short Term Goals - 04/25/20 1013      OT SHORT TERM GOAL #1   Title Independent with initial HEP    Time 4    Period Weeks    Status Achieved      OT SHORT TERM GOAL #2   Title Pt to perform low to mid level reaching of light objects RUE in prep for feeding self and bathing    Time 4    Period Weeks    Status Achieved   04/20/20     OT SHORT TERM GOAL #3   Title Pt to feed self 50% of the time with Rt dominant hand and A/E prn    Time 4    Period Weeks    Status On-going   04/20/20: able to eat finger foods     OT SHORT TERM GOAL #4   Title Pt to perform UE dressing of pullover shirt mod I level    Time 4    Period Weeks    Status On-going   04/20/20:  min A, encouraged pt to try at home; 04/22/20: min A with verbal cues for strategies and getting shirt passed elbow     OT SHORT TERM GOAL #5   Title Pt to perform LE dressing and all bathing with no more than mod assist using A/E and strategies prn    Time 4    Period Weeks    Status Achieved   04/20/20:  set-up for bedside bath, mod A for LE dressing     OT SHORT TERM GOAL #6   Title Pt to perform toileting and toilet transfers with no more than min assist overall    Time 4    Period Weeks    Status On-going      OT SHORT TERM GOAL #7   Title Improve functional use of RUE as evidenced by performing 8 blocks on Box & Blocks test    Baseline 0    Time 4    Period Weeks    Status On-going   04/20/20:  6 blocks            OT Long Term Goals - 03/23/20 1346      OT LONG TERM GOAL #1   Title Pt to be independent with updated HEP    Time 12    Period Weeks    Status New      OT LONG TERM GOAL #2   Title Pt to perform all BADLS at mod I level    Time 12    Period Weeks    Status New      OT LONG TERM GOAL #3   Title Pt to feed self 75% or greater with Rt  dominant hand and grooming 50% or  more with Rt hand    Time 12    Period Weeks    Status New      OT LONG TERM GOAL #4   Title Pt to retrieve light weight objects from high shelf RUE consistently    Time 12    Period Weeks    Status New      OT LONG TERM GOAL #5   Title Pt to demo 25 lbs grip strength or more Rt dominant hand for gripping, opening jars/containers    Time 12    Period Weeks    Status New      Long Term Additional Goals   Additional Long Term Goals Yes      OT LONG TERM GOAL #6   Title Pt to perform simple meal prep and light cleaning tasks mod I level    Time 12    Period Weeks    Status New      OT LONG TERM GOAL #7   Title Improve functional use RUE as evidenced by performing 25 blocks on Box & Blocks and performing 9 hole peg test in 90 sec or under    Time 12    Period Weeks    Status New      OT LONG TERM GOAL #8   Title Pt to returned to modified art/sculpting tasks with task modifications and A/E prn    Time 12    Period Weeks    Status New                 Plan - 04/25/20 1018    Clinical Impression Statement Pt demonstrated improved movement with RUE and functional use but continues to demonstrate difficulty with control and proprioception.    Occupational performance deficits (Please refer to evaluation for details): ADL's;IADL's;Work;Leisure;Social Participation    Body Structure / Function / Physical Skills ADL;ROM;Dexterity;Balance;IADL;Body mechanics;Improper spinal/pelvic alignment;Sensation;Mobility;Flexibility;Strength;Coordination;FMC;Tone;UE functional use;Decreased knowledge of use of DME;Proprioception    Rehab Potential Good    OT Frequency 2x / week    OT Duration 12 weeks    OT Treatment/Interventions Self-care/ADL training;Therapeutic exercise;Functional Mobility Training;Neuromuscular education;Manual Therapy;Splinting;Aquatic Therapy;Manual lymph drainage;Therapeutic activities;Coping strategies training;DME and/or AE instruction;Electrical  Stimulation;Fluidtherapy;Passive range of motion;Patient/family education    Plan functional use RUE, NMR           Patient will benefit from skilled therapeutic intervention in order to improve the following deficits and impairments:   Body Structure / Function / Physical Skills: ADL, ROM, Dexterity, Balance, IADL, Body mechanics, Improper spinal/pelvic alignment, Sensation, Mobility, Flexibility, Strength, Coordination, FMC, Tone, UE functional use, Decreased knowledge of use of DME, Proprioception       Visit Diagnosis: Hemiplegia and hemiparesis following cerebral infarction affecting right dominant side (HCC)  Other lack of coordination  Muscle weakness (generalized)    Problem List Patient Active Problem List   Diagnosis Date Noted  . Neurogenic bladder 03/17/2020  . Sepsis due to pneumonia ([place]) 03/06/2020  . History of stroke with residual deficit 03/06/2020  . Transient hypotension 03/06/2020  . Spastic hemiplegia affecting nondominant side ([place])   . History of hypertension   . Expressive aphasia   . AKI (acute kidney injury) ([place])   . Global aphasia   . Hypoalbuminemia due to protein-calorie malnutrition ([place])   . Dysphagia, post-stroke   . Hyperlipidemia 02/02/2020  . Dysphagia following cerebral infarction 02/02/2020  . Aspiration pneumonia ([place]) 02/02/2020  . Acute ischemic left MCA stroke ([place]) 02/02/2020  .  Acute respiratory failure ([place])   . Acute ischemic stroke [facility]) s/p clot retrieval L MCA & ACA A3 01/27/2020  . Aspiration into airway 01/27/2020  . Chronic anticoagulation 01/27/2020  . Paroxysmal atrial fibrillation ([place]) 06/15/2018  . Essential hypertension 06/15/2018  . NICM (nonischemic cardiomyopathy) ([place]) 06/15/2018  . Visit for monitoring Tikosyn therapy 06/12/2018    Carey Bullocks, OTR/L 04/25/2020, 2:00 PM  [facility] [address] [place], [place], [address] Phone:  [phone]   Fax:  [phone]  Name: JORDI LACKO MRN: [id number] Date of Birth: Apr 12, 1956

## 2020-04-25 NOTE — Therapy (Signed)
[facility] [address] [place], [place], [address] Phone: [phone]   Fax:  [phone]  Speech Language Pathology Treatment  Patient Details  Name: Dustin Schneider MRN: [id number] Date of Birth: 03-29-1956 Referring Provider (SLP): Dr. Delice Lesch   Encounter Date: 04/25/2020   End of Session - 04/25/20 1203    Visit Number 10    Number of Visits 25    Date for SLP Re-Evaluation 06/15/20    SLP Start Time 0849    SLP Stop Time  0931    SLP Time Calculation (min) 42 min    Activity Tolerance Patient tolerated treatment well           Past Medical History:  Diagnosis Date  . Hypertension   . NICM (nonischemic cardiomyopathy) ([place]) 06/15/2018   04/23/18: EF 45%, 09/15/18: NOrmal LVEF  . Paroxysmal atrial fibrillation ([place]) 06/15/2018  . Stroke ([place])   . Visit for monitoring Tikosyn therapy 06/12/2018    Past Surgical History:  Procedure Laterality Date  . BUBBLE STUDY  03/11/2020   Procedure: BUBBLE STUDY;  Surgeon: [name], MD;  Location: [place];  Service: Cardiovascular;;  . CARDIOVERSION N/A 05/13/2018   Procedure: CARDIOVERSION;  Surgeon: [name], MD;  Location: [facility] ENDOSCOPY;  Service: Cardiovascular;  Laterality: N/A;  . CARDIOVERSION N/A 06/13/2018   Procedure: CARDIOVERSION;  Surgeon: [name], MD;  Location: [facility] ENDOSCOPY;  Service: Cardiovascular;  Laterality: N/A;  . CARDIOVERSION N/A 06/23/2018   Procedure: CARDIOVERSION;  Surgeon: [name], MD;  Location: [facility] ENDOSCOPY;  Service: Cardiovascular;  Laterality: N/A;  . CARDIOVERSION N/A 03/11/2020   Procedure: CARDIOVERSION;  Surgeon: [name], MD;  Location: [place];  Service: Cardiovascular;  Laterality: N/A;  . excision of actinic keratosis    . EYE SURGERY     eye lift  . IR CT HEAD LTD  01/27/2020  . IR PERCUTANEOUS ART THROMBECTOMY/INFUSION INTRACRANIAL INC DIAG ANGIO  01/27/2020      . IR PERCUTANEOUS ART  THROMBECTOMY/INFUSION INTRACRANIAL INC DIAG ANGIO  01/27/2020  . RADIOLOGY WITH ANESTHESIA N/A 01/27/2020   Procedure: IR WITH ANESTHESIA;  Surgeon: Radiologist, Medication, MD;  Location: [place];  Service: Radiology;  Laterality: N/A;  . TEE WITHOUT CARDIOVERSION N/A 03/11/2020   Procedure: TRANSESOPHAGEAL ECHOCARDIOGRAM (TEE);  Surgeon: [name], MD;  Location: [place];  Service: Cardiovascular;  Laterality: N/A;  . TONSILLECTOMY    . WISDOM TOOTH EXTRACTION      There were no vitals filed for this visit.   Subjective Assessment - 04/25/20 1143    Subjective "What did you want to ask her?"    Patient is accompained by: Family member   BIL, [name]   Currently in Pain? No/denies             Speech Therapy Progress Note  Dates of Reporting Period: 03/23/20 to 04/25/20       ADULT SLP TREATMENT - 04/25/20 0900      General Information   Behavior/Cognition Alert;Cooperative;Pleasant mood      Treatment Provided   Treatment provided Cognitive-Linquistic      Cognitive-Linquistic Treatment   Treatment focused on Aphasia;Apraxia;Patient/family/caregiver education    Skilled Treatment Targeted motor planning in personally relevant setnences (see pt instructions) with fading cues and increasing sentence length.  Pt required max A for consonant clusters, alveolar and sibalent sounds. Frequent mod to max A and questioning cues to convey information re: his am routine and his sailing buddy.. Frequent questioning cues. Attempted  writting to augment speech, however this was not successful with me today. Encouraged use of gestures and attemp writing and drawing to augment communication. Dustin Schneider may benefit from SGD trial due to severity of aphasia and verbal apraxia      Assessment / Recommendations / Plan   Plan Continue with current plan of care      Progression Toward Goals   Progression toward goals Progressing toward goals            SLP Education - 04/25/20 1158    Education  Details HEP for verbal apraxia; use gestures, writing and drawing to augment speech; consider SGD    Person(s) Educated Patient;Caregiver(s)    Methods Explanation;Handout;Verbal cues    Comprehension Verbalized understanding;Verbal cues required;Need further instruction;Returned demonstration            SLP Short Term Goals - 04/25/20 1201      SLP SHORT TERM GOAL #1   Title Pt will name family, friends and art items with occasional min A 8/10 with approximations allowed.    Time 2    Period Weeks    Status On-going      SLP SHORT TERM GOAL #2   Title Pt will name 7 items in personally relevant categories with occasional min A over 2 sessions    Time 2    Period Weeks    Status On-going      SLP SHORT TERM GOAL #3   Title Pt will perform 4 automatic speech tasks with rare min A.    Time 2    Period Weeks    Status On-going      SLP SHORT TERM GOAL #4   Title Pt will write personal information (name/address/phone), 5 family/friend/pet names and 7  professional words with occasional min A    Time 2    Period Weeks    Status On-going      SLP SHORT TERM GOAL #5   Title Pt will use mulitmodal communication (gesture, draw, write 1st letter etc) to augment verbal expression to meet needs at home with rare min A from family.    Time 3    Period Weeks    Status On-going            SLP Long Term Goals - 04/25/20 1202      SLP LONG TERM GOAL #1   Title Pt will verbalize 3 words to explain or describe his artwork and hobbies with occasional min A over 3 sessions, allowing for intellgible approximations    Time 8    Period Weeks    Status On-going      SLP LONG TERM GOAL #2   Title Pt and family will utilize multimodal compensations for aphasia and verbal apraxia to participate in 3 turns each of conversation with occasional min A over 2 sessions    Time 8    Period Weeks    Status On-going      SLP LONG TERM GOAL #3   Title Pt will write 3 word phrases with less than  2 errors with rare min A over 3 sessions    Time 8    Period Weeks    Status On-going      SLP LONG TERM GOAL #4   Title Reading assessment if indicated    Time 3    Period Weeks    Status On-going            Plan - 04/25/20 1159    Clinical  Impression Statement moderate Broca's aphasia and severe verbal apraxia persist, affecting communication of basic wants, needs and social interactions. Ongoing training in multimodal communication. May consider SGD dueu to severity of apraxia. Continue skilled ST to maximize communication for QOL, safety and reduce family burden.    Speech Therapy Frequency 2x / week    Duration --   12 weeks or 25 visits   Treatment/Interventions Environmental controls;Cueing hierarchy;SLP instruction and feedback;Compensatory strategies;Functional tasks;Cognitive reorganization;Compensatory techniques;Internal/external aids;Multimodal communcation approach;Patient/family education;Language facilitation    Potential to Achieve Goals Good    Potential Considerations Severity of impairments           Patient will benefit from skilled therapeutic intervention in order to improve the following deficits and impairments:   Aphasia  Verbal apraxia    Problem List Patient Active Problem List   Diagnosis Date Noted  . Neurogenic bladder 03/17/2020  . Sepsis due to pneumonia ([place]) 03/06/2020  . History of stroke with residual deficit 03/06/2020  . Transient hypotension 03/06/2020  . Spastic hemiplegia affecting nondominant side ([place])   . History of hypertension   . Expressive aphasia   . AKI (acute kidney injury) ([place])   . Global aphasia   . Hypoalbuminemia due to protein-calorie malnutrition ([place])   . Dysphagia, post-stroke   . Hyperlipidemia 02/02/2020  . Dysphagia following cerebral infarction 02/02/2020  . Aspiration pneumonia ([place]) 02/02/2020  . Acute ischemic left MCA stroke ([place]) 02/02/2020  . Acute respiratory failure ([place])   . Acute ischemic  stroke [facility]) s/p clot retrieval L MCA & ACA A3 01/27/2020  . Aspiration into airway 01/27/2020  . Chronic anticoagulation 01/27/2020  . Paroxysmal atrial fibrillation ([place]) 06/15/2018  . Essential hypertension 06/15/2018  . NICM (nonischemic cardiomyopathy) ([place]) 06/15/2018  . Visit for monitoring Tikosyn therapy 06/12/2018    Brynnleigh Mcelwee, Annye Rusk MS, CCC-SLP 04/25/2020, 12:04 PM  [facility] [address] [place], [place], [address] Phone: [phone]   Fax:  [phone]   Name: Dustin Schneider MRN: [id number] Date of Birth: [birth year]

## 2020-04-25 NOTE — Therapy (Signed)
[facility] [address] [place], [place], [address] Phone: [phone]   Fax:  [phone]  Physical Therapy Treatment  Patient Details  Name: Dustin Schneider MRN: [id number] Date of Birth: [birth year] Referring Provider (PT): Lauraine Rinne, PA-C (will be followed by Dr. Posey Pronto)   Encounter Date: 04/25/2020   PT End of Session - 04/25/20 1103    Visit Number 13    Number of Visits 25    Date for PT Re-Evaluation 06/14/20    Authorization Type BCBS    PT Start Time 1015    PT Stop Time 1059    PT Time Calculation (min) 44 min    Equipment Utilized During Treatment Gait belt    Activity Tolerance Patient tolerated treatment well    Behavior During Therapy [facility] for tasks assessed/performed           Past Medical History:  Diagnosis Date  . Hypertension   . NICM (nonischemic cardiomyopathy) ([place]) 06/15/2018   04/23/18: EF 45%, 09/15/18: NOrmal LVEF  . Paroxysmal atrial fibrillation ([place]) 06/15/2018  . Stroke ([place])   . Visit for monitoring Tikosyn therapy 06/12/2018    Past Surgical History:  Procedure Laterality Date  . BUBBLE STUDY  03/11/2020   Procedure: BUBBLE STUDY;  Surgeon: [name], MD;  Location: [facility];  Service: Cardiovascular;;  . CARDIOVERSION N/A 05/13/2018   Procedure: CARDIOVERSION;  Surgeon: [name], MD;  Location: [facility] ENDOSCOPY;  Service: Cardiovascular;  Laterality: N/A;  . CARDIOVERSION N/A 06/13/2018   Procedure: CARDIOVERSION;  Surgeon: [name], MD;  Location: [facility] ENDOSCOPY;  Service: Cardiovascular;  Laterality: N/A;  . CARDIOVERSION N/A 06/23/2018   Procedure: CARDIOVERSION;  Surgeon: [name], MD;  Location: [facility] ENDOSCOPY;  Service: Cardiovascular;  Laterality: N/A;  . CARDIOVERSION N/A 03/11/2020   Procedure: CARDIOVERSION;  Surgeon: [name], MD;  Location: [facility];  Service: Cardiovascular;  Laterality: N/A;  . excision of actinic keratosis    . EYE SURGERY      eye lift  . IR CT HEAD LTD  01/27/2020  . IR PERCUTANEOUS ART THROMBECTOMY/INFUSION INTRACRANIAL INC DIAG ANGIO  01/27/2020      . IR PERCUTANEOUS ART THROMBECTOMY/INFUSION INTRACRANIAL INC DIAG ANGIO  01/27/2020  . RADIOLOGY WITH ANESTHESIA N/A 01/27/2020   Procedure: IR WITH ANESTHESIA;  Surgeon: Radiologist, Medication, MD;  Location: [facility];  Service: Radiology;  Laterality: N/A;  . TEE WITHOUT CARDIOVERSION N/A 03/11/2020   Procedure: TRANSESOPHAGEAL ECHOCARDIOGRAM (TEE);  Surgeon: [name], MD;  Location: [place];  Service: Cardiovascular;  Laterality: N/A;  . TONSILLECTOMY    . WISDOM TOOTH EXTRACTION      There were no vitals filed for this visit.   Subjective Assessment - 04/25/20 1018    Subjective Got the air caste at home and has been using it - its been going well. No falls.    Patient is accompained by: Family member    Pertinent History PMH: HTN, paroxysmal a fib    Limitations Standing;Walking    Patient Stated Goals "wants everything working on R side again"    Currently in Pain? No/denies                             [facility] Adult PT Treatment/Exercise - 04/25/20 1129      Transfers   Sit to Stand 4: Min guard    Sit to Stand Details Verbal cues for technique    Sit to  Stand Details (indicate cue type and reason) cues to use LUE to push up from mat vs. walker    Stand to Sit 4: Min guard    Stand to Sit Details (indicate cue type and reason) Verbal cues for technique    Stand to Sit Details verbal cues to make sure to remove R hand from orthosis prior to sitting    Squat Pivot Transfers 4: Min guard    Squat Pivot Transfer Details (indicate cue type and reason) mat to w/c with therapist making sure R ankle is in proper alignment      Neuro Re-ed    Neuro Re-ed Details  Concurrent with Bioness to R ant tib and quad: Ankle DF on rockerboard x10 reps with therapist facilitating during on times, supine with RLE over bolster (quadriceps only) 2 x 10  reps SAQs. For all weight bearing activities: R air cast donned for improved R ankle stability, off edge of elevated mat table with using LUE for support, buttock raises x10 reps with chair anteriorly to facilitate nose over toes and anterior weight shift - on times holding and off times seated and resting on mat table. Standing with RW (with R hand in orthosis) x10 reps mini squats for on time and standing with off time, with therapist providing min A and facilitating weight shift towards R, then standing and stepping forward and backwards with LLE for on times x8 reps, with min A and therapist providing tactile cues at R quad (pt fatigued easily with activity)      Acupuncturist Location R ant tib and R quad    Electrical Stimulation Action for increased muscle activation and strengthening     Electrical Stimulation Parameters see tablet 1 for parameters     Electrical Stimulation Goals Strength;Neuromuscular facilitation                    PT Short Term Goals - 04/20/20 1217      PT SHORT TERM GOAL #1   Title Pt will perform sit <> stand with RW with supervision in order to demo decr caregiver burden and improved functional BLE strength/balance. ALL STGS DUE 05/18/20    Baseline currently min guard    Time 4    Period Weeks    Status New    Target Date 05/18/20      PT SHORT TERM GOAL #2   Title Pt will perform sit > supine with supervision and supine > sit with min guard for bed mobility in order to decr caregiver burden.    Baseline 04/15/20 supervision sit to supine, min assist supine to sit as did not get right leg off mat    Time 4    Period Weeks    Status On-going      PT SHORT TERM GOAL #3   Title Pt will undergo assessment of AFO for functional transfers and gait.    Baseline Orthotist came last sesssion but did not feel pt tone stable enough to pursue brace yet. Will continue to assess need.    Time 4    Period Weeks    Status  On-going      PT SHORT TERM GOAL #4   Title Pt will ambulate 47' with RW with mod A x1 in order to improve functional mobility.    Baseline 04/15/20 Mod assist with 2nd person CGA 230' with RW. Ability does vary    Time 4    Period Weeks  Status Revised      PT SHORT TERM GOAL #6   Title Pt will perform squat pivot transfers from w/c <> mat table with supervision.    Baseline CGA w/c to/from mat 04/15/20    Time 4    Period Weeks    Status Revised             PT Long Term Goals - 03/16/20 1139      PT LONG TERM GOAL #1   Title Pt and pt's family members will be independent with final HEP in order to build upon functional gains made in therapy. ALL LTGS DUE 06/08/20    Time 12    Period Weeks    Status New    Target Date 06/08/20      PT LONG TERM GOAL #2   Title Pt will ambulate 115' with min guard/min A with RW and R AFO in order to improve household mobility.    Time 12    Period Weeks    Status New      PT LONG TERM GOAL #3   Title Pt will perform sit <> stand with RW with min guard in order to improve functional BLE strength for transfers.    Time 12    Period Weeks    Status New      PT LONG TERM GOAL #4   Title Pt will perform squat pivot transfer with mod I vs. stand pivot transfer with RW with min guard in order to decr caregiver burden.    Time 12    Period Weeks    Status New                 Plan - 04/25/20 1104    Clinical Impression Statement focus of today's skilled session was use of Biones to R quad and ant tib for NMR and strengthening. Pt tolerated session well and had incr fatigue at end of session to R quad, esp with standing weight shifting and forward stepping with RLE. Will continue to progress towards LTGs.    Personal Factors and Comorbidities Comorbidity 3+    Comorbidities dense L MCA CVA, paroxysmal A. fib on chronic Xarelto, nonischemic cardiomyopathy and hypertension.    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, works as an Electrical engineer Evolving/Moderate complexity    Rehab Potential Good    PT Frequency 2x / week    PT Duration 12 weeks    PT Treatment/Interventions ADLs/Self Care Home Management;Aquatic Therapy;DME Instruction;Gait training;Stair training;Functional mobility training;Therapeutic activities;Therapeutic exercise;Electrical Stimulation;Balance training;Neuromuscular re-education;Wheelchair mobility training;Orthotic Fit/Training;Patient/family education;Passive range of motion;Energy conservation    PT Next Visit Plan hip ABD/quad strengthening, weight shifting/NMR to RLE. continue gait training with brace, t-strap and hand orthotic; We made need to get [name] back soon to assess again for AFO? bioness to RLE (ant tib and quad) for strengthening/muscle re-ed    Consulted and Agree with Plan of Care Patient;Family member/caregiver           Patient will benefit from skilled therapeutic intervention in order to improve the following deficits and impairments:  Abnormal gait, Decreased activity tolerance, Decreased balance, Decreased cognition, Decreased mobility, Decreased coordination, Decreased range of motion, Decreased endurance, Decreased strength, Hypomobility, Difficulty walking, Impaired UE functional use, Impaired vision/preception, Postural dysfunction, Impaired sensation  Visit Diagnosis: Other lack of coordination  Hemiplegia and hemiparesis following cerebral infarction affecting right dominant side (  [place])  Unsteadiness on feet  Muscle weakness (generalized)  Other abnormalities of gait and mobility     Problem List Patient Active Problem List   Diagnosis Date Noted  . Neurogenic bladder 03/17/2020  . Sepsis due to pneumonia ([place]) 03/06/2020  .  History of stroke with residual deficit 03/06/2020  . Transient hypotension 03/06/2020  . Spastic hemiplegia affecting nondominant side ([place])   . History of hypertension   . Expressive aphasia   . AKI (acute kidney injury) ([place])   . Global aphasia   . Hypoalbuminemia due to protein-calorie malnutrition ([place])   . Dysphagia, post-stroke   . Hyperlipidemia 02/02/2020  . Dysphagia following cerebral infarction 02/02/2020  . Aspiration pneumonia ([place]) 02/02/2020  . Acute ischemic left MCA stroke ([place]) 02/02/2020  . Acute respiratory failure ([place])   . Acute ischemic stroke [facility]) s/p clot retrieval L MCA & ACA A3 01/27/2020  . Aspiration into airway 01/27/2020  . Chronic anticoagulation 01/27/2020  . Paroxysmal atrial fibrillation ([place]) 06/15/2018  . Essential hypertension 06/15/2018  . NICM (nonischemic cardiomyopathy) ([place]) 06/15/2018  . Visit for monitoring Tikosyn therapy 06/12/2018    Arliss Journey, PT, DPT  04/25/2020, 11:36 AM  [facility] [address] [place], [place], [address] Phone: [phone]   Fax:  [phone]  Name: Dustin Schneider MRN: [id number] Date of Birth: 1956-01-23

## 2020-04-26 ENCOUNTER — Other Ambulatory Visit: Payer: Self-pay

## 2020-04-26 NOTE — Patient Outreach (Signed)
[facility]) Care Management  04/26/2020  Dustin Schneider 28-Dec-1955 [id number]   Telephone outreach to patient to obtain mRS was successfully completed. MRS=5.  [facility] Management Assistant [phone]

## 2020-04-28 ENCOUNTER — Encounter
Payer: BC Managed Care – PPO | Attending: Physical Medicine & Rehabilitation | Admitting: Physical Medicine & Rehabilitation

## 2020-04-28 ENCOUNTER — Encounter: Payer: Self-pay | Admitting: Physical Medicine & Rehabilitation

## 2020-04-28 ENCOUNTER — Other Ambulatory Visit: Payer: Self-pay

## 2020-04-28 VITALS — BP 120/75 | HR 52 | Ht 68.0 in | Wt 180.0 lb

## 2020-04-28 DIAGNOSIS — R4701 Aphasia: Secondary | ICD-10-CM

## 2020-04-28 DIAGNOSIS — R269 Unspecified abnormalities of gait and mobility: Secondary | ICD-10-CM

## 2020-04-28 DIAGNOSIS — N319 Neuromuscular dysfunction of bladder, unspecified: Secondary | ICD-10-CM

## 2020-04-28 DIAGNOSIS — I69391 Dysphagia following cerebral infarction: Secondary | ICD-10-CM | POA: Insufficient documentation

## 2020-04-28 DIAGNOSIS — I10 Essential (primary) hypertension: Secondary | ICD-10-CM | POA: Insufficient documentation

## 2020-04-28 DIAGNOSIS — G8110 Spastic hemiplegia affecting unspecified side: Secondary | ICD-10-CM | POA: Diagnosis not present

## 2020-04-28 DIAGNOSIS — I639 Cerebral infarction, unspecified: Secondary | ICD-10-CM

## 2020-04-28 MED ORDER — BACLOFEN 10 MG PO TABS
5.0000 mg | ORAL_TABLET | Freq: Three times a day (TID) | ORAL | 1 refills | Status: DC
Start: 2020-04-28 — End: 2020-05-13

## 2020-04-28 NOTE — Progress Notes (Signed)
Subjective:    Patient ID: Dustin Schneider, male    DOB: Mar 17, 1956, 64 y.o.   MRN: [id number]  TELEHEALTH NOTE  Due to national recommendations of social distancing due to COVID 19, an audio/video telehealth visit is felt to be most appropriate for this patient at this time.  See Chart message from today for the patient's consent to telehealth from [place].     I verified that I am speaking with the correct person using two identifiers.  Location of patient: Home Location of provider: Office Method of communication: MyChart Video converted to telephone Names of participants : Zorita Pang scheduling, Jasmine December obtaining consent and vitals if available Established patient Time spent on call: 14 minutes  HPI Male with history of PAF/on Xarelto, and ICM presents for hospital follow up for left MCA infarct.    Last clinic visit on 03/17/2020.  Brother-in-law provides history due to aphasia.  Since that time, patient states he is still in therapies.  He continues to follow up with Neuro. He continues to wear orthosis.  Denies problems with swallowing. He tolerated wean of Urecholine. He restarted Tizanidine, which improved tone, but caused sedation.  I fall off of commode, denies injury. Continues to use wheelchair.   Pain Inventory Average Pain 0 Pain Right Now 0 My pain is no pain  In the last 24 hours, has pain interfered with the following? General activity 0 Relation with others 0 Enjoyment of life 0 What TIME of day is your pain at its worst? no pain Sleep (in general) Good  Pain is worse with: no pain Pain improves with: no pain Relief from Meds: no pain  Mobility ability to climb steps?  no do you drive?  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  no  Physicians involved in your care Any changes since last visit?  no Primary care Marrian Salvage, [place] Neurologist .   Family History  Problem Relation Age of Onset  . Glaucoma Mother   . Atrial fibrillation Father    Social History   Socioeconomic History  . Marital status: Divorced    Spouse name: Not on file  . Number of children: 1  . Years of education: Not on file  . Highest education level: Not on file  Occupational History  . Not on file  Tobacco Use  . Smoking status: Never Smoker  . Smokeless tobacco: Never Used  Vaping Use  . Vaping Use: Never used  Substance and Sexual Activity  . Alcohol use: Not Currently  . Drug use: No  . Sexual activity: Not on file    Comment: DIVORCE  Other Topics Concern  . 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 Difficulty of Paying Living Expenses: Not on file  Food Insecurity:   . Worried About Charity fundraiser in the Last Year: Not on file  . Ran Out of Food in the Last Year: Not on file  Transportation Needs:   . Lack of Transportation (Medical): Not on file  . Lack of Transportation (Non-Medical): Not  on file  Physical Activity:   . Days of Exercise per Week: Not on file  . Minutes of Exercise per Session: Not on file  Stress:   . Feeling of Stress : Not on file  Social Connections:   . Frequency of Communication with Friends and Family: Not on file  . Frequency of Social Gatherings with Friends and Family: Not on file  . Attends Religious Services: Not on file  . Active Member of Clubs or Organizations: Not on file  . Attends Archivist Meetings: Not on file  . Marital Status: Not on file   Past Surgical History:  Procedure Laterality Date  . BUBBLE STUDY  03/11/2020   Procedure:  BUBBLE STUDY;  Surgeon: [name], MD;  Location: [place];  Service: Cardiovascular;;  . CARDIOVERSION N/A 05/13/2018   Procedure: CARDIOVERSION;  Surgeon: [name], MD;  Location: [facility] ENDOSCOPY;  Service: Cardiovascular;  Laterality: N/A;  . CARDIOVERSION N/A 06/13/2018   Procedure: CARDIOVERSION;  Surgeon: [name], MD;  Location: [facility] ENDOSCOPY;  Service: Cardiovascular;  Laterality: N/A;  . CARDIOVERSION N/A 06/23/2018   Procedure: CARDIOVERSION;  Surgeon: [name], MD;  Location: [facility] ENDOSCOPY;  Service: Cardiovascular;  Laterality: N/A;  . CARDIOVERSION N/A 03/11/2020   Procedure: CARDIOVERSION;  Surgeon: [name], MD;  Location: [place];  Service: Cardiovascular;  Laterality: N/A;  . excision of actinic keratosis    . EYE SURGERY     eye lift  . IR CT HEAD LTD  01/27/2020  . IR PERCUTANEOUS ART THROMBECTOMY/INFUSION INTRACRANIAL INC DIAG ANGIO  01/27/2020      . IR PERCUTANEOUS ART THROMBECTOMY/INFUSION INTRACRANIAL INC DIAG ANGIO  01/27/2020  . RADIOLOGY WITH ANESTHESIA N/A 01/27/2020   Procedure: IR WITH ANESTHESIA;  Surgeon: Radiologist, Medication, MD;  Location: [place];  Service: Radiology;  Laterality: N/A;  . TEE WITHOUT CARDIOVERSION N/A 03/11/2020   Procedure: TRANSESOPHAGEAL ECHOCARDIOGRAM (TEE);  Surgeon: [name], MD;  Location: [place];  Service: Cardiovascular;  Laterality: N/A;  . TONSILLECTOMY    . WISDOM TOOTH EXTRACTION     Past Medical History:  Diagnosis Date  . Hypertension   . NICM (nonischemic cardiomyopathy) ([place]) 06/15/2018   04/23/18: EF 45%, 09/15/18: NOrmal LVEF  . Paroxysmal atrial fibrillation ([place]) 06/15/2018  . Stroke ([place])   . Visit for monitoring Tikosyn therapy 06/12/2018   BP 120/75 Comment: reported, virtual visit  Pulse (!) 52 Comment: reported, virtual visit  Ht 5\' 8"  (1.727 m) Comment: reported, virtual visit  Wt 180 lb (81.6 kg) Comment: reported, virtual visit  BMI 27.37 kg/m   Opioid Risk Score:   Fall Risk Score:   `1  Depression screen PHQ 2/9  Depression screen PHQ 2/9 03/17/2020  Decreased Interest 0  Down, Depressed, Hopeless 0  PHQ - 2 Score 0  Altered sleeping 1  Tired, decreased energy 0  Change in appetite 0  Feeling bad or failure about yourself  0  Trouble concentrating 1  Moving slowly or fidgety/restless 3  Suicidal thoughts 0  PHQ-9 Score 5  Difficult doing work/chores Not difficult at all    Review of Systems  Musculoskeletal: Positive for gait problem. Negative for arthralgias, back pain, joint swelling and myalgias.  Neurological: Positive for speech difficulty, weakness and numbness.       Objective:   Physical Exam Constitutional: NAD Respiratory: normal effort.  No stridor. Psych: Normal mood.  Normal behavior. Neuro: Alert Expressive aphasia, improving     Assessment & Plan:  Male with  history of PAF/on Xarelto, and ICM presents for hospital follow up for left MCA infarct.    1. Right hemiplegia, impaired mobility and ADLs secondary to left MCA ischemic stroke  Continue therapies  Follow up with Neurology  Continue WHO/PRAFO nightly  2.  Post stroke dysphagia:   Cont accommodations   Continue therapies  3.  Urinary retention/Neurogenic bladder             Continue Flomax              Trial d/c Urecholine to 12.5 TID  4.  Spasticity  Will change Tizanidine 2 TID to Baclofen 5 TID  5. Gait abnormality  Continue therapies  Cont wheelchair for safety

## 2020-04-29 ENCOUNTER — Ambulatory Visit: Payer: BC Managed Care – PPO | Admitting: Occupational Therapy

## 2020-04-29 ENCOUNTER — Ambulatory Visit: Payer: BC Managed Care – PPO

## 2020-04-29 ENCOUNTER — Ambulatory Visit: Payer: BC Managed Care – PPO | Admitting: Physical Therapy

## 2020-04-29 ENCOUNTER — Other Ambulatory Visit: Payer: Self-pay

## 2020-04-29 DIAGNOSIS — R278 Other lack of coordination: Secondary | ICD-10-CM

## 2020-04-29 DIAGNOSIS — R2689 Other abnormalities of gait and mobility: Secondary | ICD-10-CM

## 2020-04-29 DIAGNOSIS — M6281 Muscle weakness (generalized): Secondary | ICD-10-CM

## 2020-04-29 DIAGNOSIS — I69351 Hemiplegia and hemiparesis following cerebral infarction affecting right dominant side: Secondary | ICD-10-CM

## 2020-04-29 DIAGNOSIS — R482 Apraxia: Secondary | ICD-10-CM

## 2020-04-29 DIAGNOSIS — R2681 Unsteadiness on feet: Secondary | ICD-10-CM

## 2020-04-29 DIAGNOSIS — R4701 Aphasia: Secondary | ICD-10-CM

## 2020-04-29 NOTE — Patient Instructions (Addendum)
Laying on your back, hold a paper towel roll between your hand, raise arms up over head, have someone help with shoulder and elbow positioning, move paper towel roll forwards and backwards 10 reps 2x day  Hold a broomstick in a vertical position while seated, bend and straighten arm gentle with assistance, 10 reps 2x day. Seated hold broomstick in horizontal position with washcloth under right hand move right hand out to the side and back, 10 reps  Repeat above exercise with someone holding the end of the broom stick straighten elbow out in front , 10 reps 2x day.

## 2020-04-29 NOTE — Therapy (Signed)
[facility] [address] [place], [place], [address] Phone: [phone]   Fax:  [phone]  Occupational Therapy Treatment  Patient Details  Name: Dustin Schneider MRN: [id number] Date of Birth: 12/20/1955 No data recorded  Encounter Date: 04/29/2020   OT End of Session - 04/29/20 1104    Visit Number 11    Number of Visits 25    Date for OT Re-Evaluation 06/23/20    Authorization Type BC/BS - No auth required, covered 100%    OT Start Time 1022    OT Stop Time 1101    OT Time Calculation (min) 39 min    Activity Tolerance Patient tolerated treatment well    Behavior During Therapy WFL for tasks assessed/performed           Past Medical History:  Diagnosis Date  . Hypertension   . NICM (nonischemic cardiomyopathy) ([place]) 06/15/2018   04/23/18: EF 45%, 09/15/18: NOrmal LVEF  . Paroxysmal atrial fibrillation ([place]) 06/15/2018  . Stroke ([place])   . Visit for monitoring Tikosyn therapy 06/12/2018    Past Surgical History:  Procedure Laterality Date  . BUBBLE STUDY  03/11/2020   Procedure: BUBBLE STUDY;  Surgeon: [name], MD;  Location: [place];  Service: Cardiovascular;;  . CARDIOVERSION N/A 05/13/2018   Procedure: CARDIOVERSION;  Surgeon: [name], MD;  Location: [facility] ENDOSCOPY;  Service: Cardiovascular;  Laterality: N/A;  . CARDIOVERSION N/A 06/13/2018   Procedure: CARDIOVERSION;  Surgeon: [name], MD;  Location: [facility] ENDOSCOPY;  Service: Cardiovascular;  Laterality: N/A;  . CARDIOVERSION N/A 06/23/2018   Procedure: CARDIOVERSION;  Surgeon: [name], MD;  Location: [facility] ENDOSCOPY;  Service: Cardiovascular;  Laterality: N/A;  . CARDIOVERSION N/A 03/11/2020   Procedure: CARDIOVERSION;  Surgeon: [name], MD;  Location: [place];  Service: Cardiovascular;  Laterality: N/A;  . excision of actinic keratosis    . EYE SURGERY     eye lift  . IR CT HEAD LTD  01/27/2020  . IR PERCUTANEOUS ART  THROMBECTOMY/INFUSION INTRACRANIAL INC DIAG ANGIO  01/27/2020      . IR PERCUTANEOUS ART THROMBECTOMY/INFUSION INTRACRANIAL INC DIAG ANGIO  01/27/2020  . RADIOLOGY WITH ANESTHESIA N/A 01/27/2020   Procedure: IR WITH ANESTHESIA;  Surgeon: Radiologist, Medication, MD;  Location: [place];  Service: Radiology;  Laterality: N/A;  . TEE WITHOUT CARDIOVERSION N/A 03/11/2020   Procedure: TRANSESOPHAGEAL ECHOCARDIOGRAM (TEE);  Surgeon: [name], MD;  Location: [place];  Service: Cardiovascular;  Laterality: N/A;  . TONSILLECTOMY    . WISDOM TOOTH EXTRACTION      There were no vitals filed for this visit.   Subjective Assessment - 04/29/20 1106    Subjective  Pt denies any pain today    Patient is accompanied by: Family member    Pertinent History Lt MCA CVA 01/27/20, readmitted to hospital with HAP and fever on 03/06/20 and released 03/11/20. PMH: paroxsymal A-fib, HTN    Limitations fall    Currently in Pain? No/denies                     Treatment: Pt / brother were instructed in updates to HEP. See pt instructions(supine closed chain shoulder flexion, min-mod facilitation, then seated exercises with broomstick in vertical and horizontal position, for AA/ROMmin facilitation/ v.c) Weightbearing through RUE seated on mat with body on arm movements, min v.c  A/ROM supination / pronation, and self P/ROM wrist flexion/ extension.  OT Short Term Goals - 04/29/20 1037      OT SHORT TERM GOAL #1   Title Independent with initial HEP    Time 4    Period Weeks    Status Achieved      OT SHORT TERM GOAL #2   Title Pt to perform low to mid level reaching of light objects RUE in prep for feeding self and bathing    Time 4    Period Weeks    Status Achieved   04/20/20     OT SHORT TERM GOAL #3   Title Pt to feed self 50% of the time with Rt dominant hand and A/E prn    Time 4    Period Weeks    Status On-going   04/20/20: able to eat finger foods     OT SHORT TERM  GOAL #4   Title Pt to perform UE dressing of pullover shirt mod I level    Time 4    Period Weeks    Status Achieved   met per pt and brother     OT SHORT TERM GOAL #5   Title Pt to perform LE dressing and all bathing with no more than mod assist using A/E and strategies prn    Time 4    Period Weeks    Status Achieved   04/20/20:  set-up for bedside bath, mod A for LE dressing     OT SHORT TERM GOAL #6   Title Pt to perform toileting and toilet transfers with no more than min assist overall    Time 4    Period Weeks    Status On-going      OT SHORT TERM GOAL #7   Title Improve functional use of RUE as evidenced by performing 8 blocks on Box & Blocks test    Baseline 0    Time 4    Period Weeks    Status On-going   04/20/20:  6 blocks            OT Long Term Goals - 03/23/20 1346      OT LONG TERM GOAL #1   Title Pt to be independent with updated HEP    Time 12    Period Weeks    Status New      OT LONG TERM GOAL #2   Title Pt to perform all BADLS at mod I level    Time 12    Period Weeks    Status New      OT LONG TERM GOAL #3   Title Pt to feed self 75% or greater with Rt dominant hand and grooming 50% or more with Rt hand    Time 12    Period Weeks    Status New      OT LONG TERM GOAL #4   Title Pt to retrieve light weight objects from high shelf RUE consistently    Time 12    Period Weeks    Status New      OT LONG TERM GOAL #5   Title Pt to demo 25 lbs grip strength or more Rt dominant hand for gripping, opening jars/containers    Time 12    Period Weeks    Status New      Long Term Additional Goals   Additional Long Term Goals Yes      OT LONG TERM GOAL #6   Title Pt to perform simple meal prep and light cleaning tasks mod  I level    Time 12    Period Weeks    Status New      OT LONG TERM GOAL #7   Title Improve functional use RUE as evidenced by performing 25 blocks on Box & Blocks and performing 9 hole peg test in 90 sec or under    Time 12     Period Weeks    Status New      OT LONG TERM GOAL #8   Title Pt to returned to modified art/sculpting tasks with task modifications and A/E prn    Time 12    Period Weeks    Status New                 Plan - 04/29/20 1108    Clinical Impression Statement Pt is progressing towards goals with improving RUE function.    Occupational performance deficits (Please refer to evaluation for details): ADL's;IADL's;Work;Leisure;Social Participation    Body Structure / Function / Physical Skills ADL;ROM;Dexterity;Balance;IADL;Body mechanics;Improper spinal/pelvic alignment;Sensation;Mobility;Flexibility;Strength;Coordination;FMC;Tone;UE functional use;Decreased knowledge of use of DME;Proprioception    Rehab Potential Good    OT Frequency 2x / week    OT Duration 12 weeks    OT Treatment/Interventions Self-care/ADL training;Therapeutic exercise;Functional Mobility Training;Neuromuscular education;Manual Therapy;Splinting;Aquatic Therapy;Manual lymph drainage;Therapeutic activities;Coping strategies training;DME and/or AE instruction;Electrical Stimulation;Fluidtherapy;Passive range of motion;Patient/family education    Plan functional use RUE, NMR           Patient will benefit from skilled therapeutic intervention in order to improve the following deficits and impairments:   Body Structure / Function / Physical Skills: ADL, ROM, Dexterity, Balance, IADL, Body mechanics, Improper spinal/pelvic alignment, Sensation, Mobility, Flexibility, Strength, Coordination, FMC, Tone, UE functional use, Decreased knowledge of use of DME, Proprioception       Visit Diagnosis: Hemiplegia and hemiparesis following cerebral infarction affecting right dominant side (HCC)  Other lack of coordination  Muscle weakness (generalized)    Problem List Patient Active Problem List   Diagnosis Date Noted  . Abnormality of gait 04/28/2020  . Neurogenic bladder 03/17/2020  . Sepsis due to pneumonia  ([place]) 03/06/2020  . History of stroke with residual deficit 03/06/2020  . Transient hypotension 03/06/2020  . Spastic hemiplegia affecting nondominant side ([place])   . History of hypertension   . Expressive aphasia   . AKI (acute kidney injury) ([place])   . Global aphasia   . Hypoalbuminemia due to protein-calorie malnutrition ([place])   . Dysphagia, post-stroke   . Hyperlipidemia 02/02/2020  . Dysphagia following cerebral infarction 02/02/2020  . Aspiration pneumonia ([place]) 02/02/2020  . Acute ischemic left MCA stroke ([place]) 02/02/2020  . Acute respiratory failure ([place])   . Acute ischemic stroke [facility]) s/p clot retrieval L MCA & ACA A3 01/27/2020  . Aspiration into airway 01/27/2020  . Chronic anticoagulation 01/27/2020  . Paroxysmal atrial fibrillation ([place]) 06/15/2018  . Essential hypertension 06/15/2018  . NICM (nonischemic cardiomyopathy) ([place]) 06/15/2018  . Visit for monitoring Tikosyn therapy 06/12/2018    Dailin Sosnowski 04/29/2020, [facility] [address] [place], [place], [address] Phone: [phone]   Fax:  [phone]  Name: Dustin Schneider MRN: [id number] Date of Birth: 03-Jun-1956

## 2020-04-29 NOTE — Therapy (Signed)
[facility] [address] [place], [place], [address] Phone: [phone]   Fax:  [phone]  Physical Therapy Treatment  Patient Details  Name: Dustin Schneider MRN: [id number] Date of Birth: 15-Mar-1956 Referring Provider (PT): Lauraine Rinne, PA-C (will be followed by Dr. Posey Pronto)   Encounter Date: 04/29/2020   PT End of Session - 04/29/20 1210    Visit Number 14    Number of Visits 25    Date for PT Re-Evaluation 06/14/20    Authorization Type BCBS    PT Start Time 1100    PT Stop Time 1145    PT Time Calculation (min) 45 min    Equipment Utilized During Treatment Gait belt    Activity Tolerance Patient tolerated treatment well    Behavior During Therapy [facility] for tasks assessed/performed           Past Medical History:  Diagnosis Date  . Hypertension   . NICM (nonischemic cardiomyopathy) ([place]) 06/15/2018   04/23/18: EF 45%, 09/15/18: NOrmal LVEF  . Paroxysmal atrial fibrillation ([place]) 06/15/2018  . Stroke ([place])   . Visit for monitoring Tikosyn therapy 06/12/2018    Past Surgical History:  Procedure Laterality Date  . BUBBLE STUDY  03/11/2020   Procedure: BUBBLE STUDY;  Surgeon: [name], MD;  Location: [place];  Service: Cardiovascular;;  . CARDIOVERSION N/A 05/13/2018   Procedure: CARDIOVERSION;  Surgeon: [name], MD;  Location: [facility] ENDOSCOPY;  Service: Cardiovascular;  Laterality: N/A;  . CARDIOVERSION N/A 06/13/2018   Procedure: CARDIOVERSION;  Surgeon: [name], MD;  Location: [facility] ENDOSCOPY;  Service: Cardiovascular;  Laterality: N/A;  . CARDIOVERSION N/A 06/23/2018   Procedure: CARDIOVERSION;  Surgeon: [name], MD;  Location: [facility] ENDOSCOPY;  Service: Cardiovascular;  Laterality: N/A;  . CARDIOVERSION N/A 03/11/2020   Procedure: CARDIOVERSION;  Surgeon: [name], MD;  Location: [facility];  Service: Cardiovascular;  Laterality: N/A;  . excision of actinic keratosis    . EYE SURGERY      eye lift  . IR CT HEAD LTD  01/27/2020  . IR PERCUTANEOUS ART THROMBECTOMY/INFUSION INTRACRANIAL INC DIAG ANGIO  01/27/2020      . IR PERCUTANEOUS ART THROMBECTOMY/INFUSION INTRACRANIAL INC DIAG ANGIO  01/27/2020  . RADIOLOGY WITH ANESTHESIA N/A 01/27/2020   Procedure: IR WITH ANESTHESIA;  Surgeon: Radiologist, Medication, MD;  Location: [facility];  Service: Radiology;  Laterality: N/A;  . TEE WITHOUT CARDIOVERSION N/A 03/11/2020   Procedure: TRANSESOPHAGEAL ECHOCARDIOGRAM (TEE);  Surgeon: [name], MD;  Location: [facility];  Service: Cardiovascular;  Laterality: N/A;  . TONSILLECTOMY    . WISDOM TOOTH EXTRACTION      There were no vitals filed for this visit.   Subjective Assessment - 04/29/20 1209    Subjective Had a virtual visit with Dr. Posey Pronto yesterday. Was switched from Tizanidine to baclofen    Patient is accompained by: Family member    Pertinent History PMH: HTN, paroxysmal a fib    Limitations Standing;Walking    Patient Stated Goals "wants everything working on R side again"    Currently in Pain? No/denies                             [facility] Adult PT Treatment/Exercise - 04/29/20 1217      Transfers   Transfers Sit to Stand;Stand to Sit    Sit to Stand 4: Min guard    Sit to Stand Details Verbal cues for technique  Sit to Stand Details (indicate cue type and reason) verbal cues for proper UE placement, min guard for steading initially for pt to place weight through RLE    Stand to Sit 4: Min guard    Stand to Sit Details (indicate cue type and reason) Verbal cues for technique    Stand to Sit Details cues to remove R hand from hand orthosis before sitting    Squat Pivot Transfers 4: Min guard    Squat Pivot Transfer Details (indicate cue type and reason) from mat table > w/c       Ambulation/Gait   Ambulation/Gait Yes    Ambulation/Gait Assistance 3: Mod assist   X1   Ambulation/Gait Assistance Details w/c follow for safety. assist from therapist for  proper RLE placement to decr narrow BOS and RLE external rotation, verbal and tactile cues in stance phase for R quad and glute activation, pt demonstrating incr R hip external rotation when fatigued towards end of session    Ambulation Distance (Feet) 70 Feet   x1, 55 x 1   Assistive device Rolling walker   R hand orthosis, R anterior ottobock AFO   Gait Pattern Step-to pattern;Step-through pattern;Decreased stance time - right;Decreased step length - right;Decreased step length - left;Right flexed knee in stance    Ambulation Surface Level;Indoor      Neuro Re-ed    Neuro Re-ed Details  standing with RW: 2 x 10 reps weight shifting towards R and stepping forward and back to midline with LLE, verbal and tactile cues for R quad and glute activation when shifitng weight      Exercises   Exercises Other Exercises    Other Exercises  supine on mat table: 2 x 10 reps bridging with gait belt around B thighs for isometric hip ABD activation, verbal cues and tactile cues for full ROM - therapist placing hand over pt's R ASIS for incr R hip extensor activation, 2 x 10 reps SAQs with 2# ankle weight with 3 second holds at end of ROM, x10 reps resisted R hip flexion in hooklying with use of red tband                    PT Short Term Goals - 04/20/20 1217      PT SHORT TERM GOAL #1   Title Pt will perform sit <> stand with RW with supervision in order to demo decr caregiver burden and improved functional BLE strength/balance. ALL STGS DUE 05/18/20    Baseline currently min guard    Time 4    Period Weeks    Status New    Target Date 05/18/20      PT SHORT TERM GOAL #2   Title Pt will perform sit > supine with supervision and supine > sit with min guard for bed mobility in order to decr caregiver burden.    Baseline 04/15/20 supervision sit to supine, min assist supine to sit as did not get right leg off mat    Time 4    Period Weeks    Status On-going      PT SHORT TERM GOAL #3   Title  Pt will undergo assessment of AFO for functional transfers and gait.    Baseline Orthotist came last sesssion but did not feel pt tone stable enough to pursue brace yet. Will continue to assess need.    Time 4    Period Weeks    Status On-going      PT  SHORT TERM GOAL #4   Title Pt will ambulate 23' with RW with mod A x1 in order to improve functional mobility.    Baseline 04/15/20 Mod assist with 2nd person CGA 230' with RW. Ability does vary    Time 4    Period Weeks    Status Revised      PT SHORT TERM GOAL #6   Title Pt will perform squat pivot transfers from w/c <> mat table with supervision.    Baseline CGA w/c to/from mat 04/15/20    Time 4    Period Weeks    Status Revised             PT Long Term Goals - 03/16/20 1139      PT LONG TERM GOAL #1   Title Pt and pt's family members will be independent with final HEP in order to build upon functional gains made in therapy. ALL LTGS DUE 06/08/20    Time 12    Period Weeks    Status New    Target Date 06/08/20      PT LONG TERM GOAL #2   Title Pt will ambulate 115' with min guard/min A with RW and R AFO in order to improve household mobility.    Time 12    Period Weeks    Status New      PT LONG TERM GOAL #3   Title Pt will perform sit <> stand with RW with min guard in order to improve functional BLE strength for transfers.    Time 12    Period Weeks    Status New      PT LONG TERM GOAL #4   Title Pt will perform squat pivot transfer with mod I vs. stand pivot transfer with RW with min guard in order to decr caregiver burden.    Time 12    Period Weeks    Status New                 Plan - 04/29/20 1211    Clinical Impression Statement Focus of today's skilled session was RLE strengthening (focus on hip ABD, ext and knee extensors), NMR for RLE weight shifting and gait training with RW and R hand orthosis. Pt needing mod A x1 for gait today with w/c follow for safety - therapist assisting with balance and  proper foot placement as pt still with tendency to ADD and have R foot in external rotation due to incr tone (more noticeable when fatigued). Will continue to progress towards LTGs.    Personal Factors and Comorbidities Comorbidity 3+    Comorbidities dense L MCA CVA, paroxysmal A. fib on chronic Xarelto, nonischemic cardiomyopathy and hypertension.    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, works as an Electrical engineer Evolving/Moderate complexity    Rehab Potential Good    PT Frequency 2x / week    PT Duration 12 weeks    PT Treatment/Interventions ADLs/Self Care Home Management;Aquatic Therapy;DME Instruction;Gait training;Stair training;Functional mobility training;Therapeutic activities;Therapeutic exercise;Electrical Stimulation;Balance training;Neuromuscular re-education;Wheelchair mobility training;Orthotic Fit/Training;Patient/family education;Passive range of motion;Energy conservation    PT Next Visit Plan hip ABD/quad strengthening, weight shifting/NMR to RLE. continue gait training with brace, t-strap and hand orthotic - use dance sock for foot clearance; We made need to get [name] back soon to assess again for AFO? bioness to RLE (ant tib and quad) for strengthening/muscle re-ed  Consulted and Agree with Plan of Care Patient;Family member/caregiver           Patient will benefit from skilled therapeutic intervention in order to improve the following deficits and impairments:  Abnormal gait, Decreased activity tolerance, Decreased balance, Decreased cognition, Decreased mobility, Decreased coordination, Decreased range of motion, Decreased endurance, Decreased strength, Hypomobility, Difficulty walking, Impaired UE functional use, Impaired vision/preception, Postural  dysfunction, Impaired sensation  Visit Diagnosis: Other lack of coordination  Hemiplegia and hemiparesis following cerebral infarction affecting right dominant side (HCC)  Muscle weakness (generalized)  Unsteadiness on feet  Other abnormalities of gait and mobility     Problem List Patient Active Problem List   Diagnosis Date Noted  . Abnormality of gait 04/28/2020  . Neurogenic bladder 03/17/2020  . Sepsis due to pneumonia ([place]) 03/06/2020  . History of stroke with residual deficit 03/06/2020  . Transient hypotension 03/06/2020  . Spastic hemiplegia affecting nondominant side ([place])   . History of hypertension   . Expressive aphasia   . AKI (acute kidney injury) ([place])   . Global aphasia   . Hypoalbuminemia due to protein-calorie malnutrition ([place])   . Dysphagia, post-stroke   . Hyperlipidemia 02/02/2020  . Dysphagia following cerebral infarction 02/02/2020  . Aspiration pneumonia ([place]) 02/02/2020  . Acute ischemic left MCA stroke ([place]) 02/02/2020  . Acute respiratory failure ([place])   . Acute ischemic stroke [facility]) s/p clot retrieval L MCA & ACA A3 01/27/2020  . Aspiration into airway 01/27/2020  . Chronic anticoagulation 01/27/2020  . Paroxysmal atrial fibrillation ([place]) 06/15/2018  . Essential hypertension 06/15/2018  . NICM (nonischemic cardiomyopathy) ([place]) 06/15/2018  . Visit for monitoring Tikosyn therapy 06/12/2018    Arliss Journey, PT, DPT  04/29/2020, 12:24 PM  [facility] [address] [place], [place], [address] Phone: [phone]   Fax:  [phone]  Name: Dustin Schneider MRN: [id number] Date of Birth: Mar 04, 1956

## 2020-04-29 NOTE — Therapy (Signed)
[facility] [address] [place], [place], [address] Phone: [phone]   Fax:  [phone]  Speech Language Pathology Treatment  Patient Details  Name: Dustin Schneider MRN: [id number] Date of Birth: [birth year] Referring Provider (SLP): Dr. Delice Lesch   Encounter Date: 04/29/2020   End of Session - 04/29/20 1308    Visit Number 11    Number of Visits 25    Date for SLP Re-Evaluation 06/15/20    SLP Start Time 0935    SLP Stop Time  1025    SLP Time Calculation (min) 40 min    Activity Tolerance Patient tolerated treatment well           Past Medical History:  Diagnosis Date  . Hypertension   . NICM (nonischemic cardiomyopathy) ([place]) 06/15/2018   04/23/18: EF 45%, 09/15/18: NOrmal LVEF  . Paroxysmal atrial fibrillation ([place]) 06/15/2018  . Stroke ([place])   . Visit for monitoring Tikosyn therapy 06/12/2018    Past Surgical History:  Procedure Laterality Date  . BUBBLE STUDY  03/11/2020   Procedure: BUBBLE STUDY;  Surgeon: [name], MD;  Location: [facility];  Service: Cardiovascular;;  . CARDIOVERSION N/A 05/13/2018   Procedure: CARDIOVERSION;  Surgeon: [name], MD;  Location: [facility] ENDOSCOPY;  Service: Cardiovascular;  Laterality: N/A;  . CARDIOVERSION N/A 06/13/2018   Procedure: CARDIOVERSION;  Surgeon: [name], MD;  Location: [facility] ENDOSCOPY;  Service: Cardiovascular;  Laterality: N/A;  . CARDIOVERSION N/A 06/23/2018   Procedure: CARDIOVERSION;  Surgeon: [name], MD;  Location: [facility] ENDOSCOPY;  Service: Cardiovascular;  Laterality: N/A;  . CARDIOVERSION N/A 03/11/2020   Procedure: CARDIOVERSION;  Surgeon: [name], MD;  Location: [facility];  Service: Cardiovascular;  Laterality: N/A;  . excision of actinic keratosis    . EYE SURGERY     eye lift  . IR CT HEAD LTD  01/27/2020  . IR PERCUTANEOUS ART THROMBECTOMY/INFUSION INTRACRANIAL INC DIAG ANGIO  01/27/2020      . IR PERCUTANEOUS ART  THROMBECTOMY/INFUSION INTRACRANIAL INC DIAG ANGIO  01/27/2020  . RADIOLOGY WITH ANESTHESIA N/A 01/27/2020   Procedure: IR WITH ANESTHESIA;  Surgeon: Radiologist, Medication, MD;  Location: [facility];  Service: Radiology;  Laterality: N/A;  . TEE WITHOUT CARDIOVERSION N/A 03/11/2020   Procedure: TRANSESOPHAGEAL ECHOCARDIOGRAM (TEE);  Surgeon: [name], MD;  Location: [facility];  Service: Cardiovascular;  Laterality: N/A;  . TONSILLECTOMY    . WISDOM TOOTH EXTRACTION      There were no vitals filed for this visit.   Subjective Assessment - 04/29/20 1102    Subjective No S    Patient is accompained by: Family member   [name] - BIL   Currently in Pain? No/denies                 ADULT SLP TREATMENT - 04/29/20 1103      General Information   Behavior/Cognition Alert;Cooperative;Pleasant mood      Treatment Provided   Treatment provided Cognitive-Linquistic      Cognitive-Linquistic Treatment   Treatment focused on Aphasia;Apraxia    Skilled Treatment SLP demonstrated use of Lingraphica device to pt and BIL [name] today. Pt accessed icons given SLP cues to do so with independence, [name] also spelled first parts of words in searches for icons and for pictures. Pt spoke "restaurant" clearly when typing out the word and SLP used this as an example for pt and [name] that a SGD can foster correct speech and language production as well as ease  the burden of verbal communication for someone with significant apraxia and aphasia. [name] to ponder this with family this weekend and see if a trial is desired - will let SLP know next ST visit.      Assessment / Recommendations / Plan   Plan Continue with current plan of care      Progression Toward Goals   Progression toward goals Progressing toward goals              SLP Short Term Goals - 04/29/20 1309      SLP SHORT TERM GOAL #1   Title Pt will name family, friends and art items with occasional min A 8/10 with approximations allowed.     Time 2    Period Weeks    Status On-going      SLP SHORT TERM GOAL #2   Title Pt will name 7 items in personally relevant categories with occasional min A over 2 sessions    Time 2    Period Weeks    Status On-going      SLP SHORT TERM GOAL #3   Title Pt will perform 4 automatic speech tasks with rare min A.    Time 2    Period Weeks    Status On-going      SLP SHORT TERM GOAL #4   Title Pt will write personal information (name/address/phone), 5 family/friend/pet names and 7  professional words with occasional min A    Time 2    Period Weeks    Status On-going      SLP SHORT TERM GOAL #5   Title Pt will use mulitmodal communication (gesture, draw, write 1st letter etc) to augment verbal expression to meet needs at home with rare min A from family.    Time 3    Period Weeks    Status On-going            SLP Long Term Goals - 04/29/20 1309      SLP LONG TERM GOAL #1   Title Pt will verbalize 3 words to explain or describe his artwork and hobbies with occasional min A over 3 sessions, allowing for intellgible approximations    Time 8    Period Weeks    Status On-going      SLP LONG TERM GOAL #2   Title Pt and family will utilize multimodal compensations for aphasia and verbal apraxia to participate in 3 turns each of conversation with occasional min A over 2 sessions    Time 8    Period Weeks    Status On-going      SLP LONG TERM GOAL #3   Title Pt will write 3 word phrases with less than 2 errors with rare min A over 3 sessions    Time 8    Period Weeks    Status On-going      SLP LONG TERM GOAL #4   Title Reading assessment if indicated    Time 3    Period Weeks    Status On-going            Plan - 04/29/20 1308    Clinical Impression Statement moderate Broca's aphasia and severe verbal apraxia persist, affecting communication of basic wants, needs and social interactions. Ongoing training in multimodal communication. SGD was demonstrated today for pt to  consider trial due to severity of apraxia. Continue skilled ST to maximize communication for QOL, safety and reduce family burden.    Speech Therapy Frequency 2x /  week    Duration --   12 weeks or 25 visits   Treatment/Interventions Environmental controls;Cueing hierarchy;SLP instruction and feedback;Compensatory strategies;Functional tasks;Cognitive reorganization;Compensatory techniques;Internal/external aids;Multimodal communcation approach;Patient/family education;Language facilitation    Potential to Achieve Goals Good    Potential Considerations Severity of impairments           Patient will benefit from skilled therapeutic intervention in order to improve the following deficits and impairments:   Aphasia  Verbal apraxia    Problem List Patient Active Problem List   Diagnosis Date Noted  . Abnormality of gait 04/28/2020  . Neurogenic bladder 03/17/2020  . Sepsis due to pneumonia ([place]) 03/06/2020  . History of stroke with residual deficit 03/06/2020  . Transient hypotension 03/06/2020  . Spastic hemiplegia affecting nondominant side ([place])   . History of hypertension   . Expressive aphasia   . AKI (acute kidney injury) ([place])   . Global aphasia   . Hypoalbuminemia due to protein-calorie malnutrition ([place])   . Dysphagia, post-stroke   . Hyperlipidemia 02/02/2020  . Dysphagia following cerebral infarction 02/02/2020  . Aspiration pneumonia ([place]) 02/02/2020  . Acute ischemic left MCA stroke ([name]) 02/02/2020  . Acute respiratory failure ([place])   . Acute ischemic stroke [facility]) s/p clot retrieval L MCA & ACA A3 01/27/2020  . Aspiration into airway 01/27/2020  . Chronic anticoagulation 01/27/2020  . Paroxysmal atrial fibrillation ([place]) 06/15/2018  . Essential hypertension 06/15/2018  . NICM (nonischemic cardiomyopathy) ([place]) 06/15/2018  . Visit for monitoring Tikosyn therapy 06/12/2018    [facility] ,MS, [place]  04/29/2020, 1:10 PM  [facility] [address] [place], [place], [address] Phone: [phone]   Fax:  [phone]   Name: Dustin Schneider MRN: [id number] Date of Birth: May 14, 1956

## 2020-05-02 ENCOUNTER — Ambulatory Visit: Payer: BC Managed Care – PPO | Admitting: Speech Pathology

## 2020-05-02 ENCOUNTER — Encounter: Payer: Self-pay | Admitting: Physical Therapy

## 2020-05-02 ENCOUNTER — Ambulatory Visit: Payer: BC Managed Care – PPO | Admitting: Occupational Therapy

## 2020-05-02 ENCOUNTER — Ambulatory Visit: Payer: BC Managed Care – PPO | Admitting: Physical Therapy

## 2020-05-02 ENCOUNTER — Other Ambulatory Visit: Payer: Self-pay

## 2020-05-02 ENCOUNTER — Encounter: Payer: Self-pay | Admitting: Speech Pathology

## 2020-05-02 DIAGNOSIS — R2689 Other abnormalities of gait and mobility: Secondary | ICD-10-CM

## 2020-05-02 DIAGNOSIS — M6281 Muscle weakness (generalized): Secondary | ICD-10-CM

## 2020-05-02 DIAGNOSIS — R278 Other lack of coordination: Secondary | ICD-10-CM

## 2020-05-02 DIAGNOSIS — R4701 Aphasia: Secondary | ICD-10-CM

## 2020-05-02 DIAGNOSIS — I69351 Hemiplegia and hemiparesis following cerebral infarction affecting right dominant side: Secondary | ICD-10-CM | POA: Diagnosis not present

## 2020-05-02 DIAGNOSIS — R2681 Unsteadiness on feet: Secondary | ICD-10-CM

## 2020-05-02 DIAGNOSIS — R482 Apraxia: Secondary | ICD-10-CM

## 2020-05-02 DIAGNOSIS — R262 Difficulty in walking, not elsewhere classified: Secondary | ICD-10-CM

## 2020-05-02 DIAGNOSIS — R29818 Other symptoms and signs involving the nervous system: Secondary | ICD-10-CM

## 2020-05-02 NOTE — Patient Instructions (Signed)
   Amy teaches art history, ancient through modern  Amy visited me two times  I teach at [employer]  I had breakfast with Sterling Big at               [place], Sterling Big, Randall Hiss and  [place] were there  Scrambled eggs, bacon, hash browns, half caff, fruit cup  I go to [facility]  I love that you are getting out and practicing conversation!!! This is so important - keep it up!!  Bring in your notebook to therapy

## 2020-05-02 NOTE — Therapy (Signed)
[facility] [address] [place], [place], [address] Phone: [phone]   Fax:  [phone]  Physical Therapy Treatment  Patient Details  Name: Dustin Schneider MRN: [id number] Date of Birth: 10-Apr-1956 Referring Provider (PT): Lauraine Rinne, PA-C (will be followed by Dr. Posey Pronto)   Encounter Date: 05/02/2020   PT End of Session - 05/02/20 1224    Visit Number 15    Number of Visits 25    Date for PT Re-Evaluation 06/14/20    Authorization Type BCBS    PT Start Time 1102    PT Stop Time 1147    PT Time Calculation (min) 45 min    Equipment Utilized During Treatment Gait belt    Activity Tolerance Patient tolerated treatment well    Behavior During Therapy [facility] for tasks assessed/performed           Past Medical History:  Diagnosis Date  . Hypertension   . NICM (nonischemic cardiomyopathy) ([place]) 06/15/2018   04/23/18: EF 45%, 09/15/18: NOrmal LVEF  . Paroxysmal atrial fibrillation ([place]) 06/15/2018  . Stroke ([place])   . Visit for monitoring Tikosyn therapy 06/12/2018    Past Surgical History:  Procedure Laterality Date  . BUBBLE STUDY  03/11/2020   Procedure: BUBBLE STUDY;  Surgeon: [name], MD;  Location: [facility];  Service: Cardiovascular;;  . CARDIOVERSION N/A 05/13/2018   Procedure: CARDIOVERSION;  Surgeon: [name], MD;  Location: [facility] ENDOSCOPY;  Service: Cardiovascular;  Laterality: N/A;  . CARDIOVERSION N/A 06/13/2018   Procedure: CARDIOVERSION;  Surgeon: [name], MD;  Location: [facility] ENDOSCOPY;  Service: Cardiovascular;  Laterality: N/A;  . CARDIOVERSION N/A 06/23/2018   Procedure: CARDIOVERSION;  Surgeon: [name], MD;  Location: [facility] ENDOSCOPY;  Service: Cardiovascular;  Laterality: N/A;  . CARDIOVERSION N/A 03/11/2020   Procedure: CARDIOVERSION;  Surgeon: [name], MD;  Location: [facility];  Service: Cardiovascular;  Laterality: N/A;  . excision of actinic keratosis    . EYE SURGERY      eye lift  . IR CT HEAD LTD  01/27/2020  . IR PERCUTANEOUS ART THROMBECTOMY/INFUSION INTRACRANIAL INC DIAG ANGIO  01/27/2020      . IR PERCUTANEOUS ART THROMBECTOMY/INFUSION INTRACRANIAL INC DIAG ANGIO  01/27/2020  . RADIOLOGY WITH ANESTHESIA N/A 01/27/2020   Procedure: IR WITH ANESTHESIA;  Surgeon: Radiologist, Medication, MD;  Location: [place];  Service: Radiology;  Laterality: N/A;  . TEE WITHOUT CARDIOVERSION N/A 03/11/2020   Procedure: TRANSESOPHAGEAL ECHOCARDIOGRAM (TEE);  Surgeon: [name], MD;  Location: [place];  Service: Cardiovascular;  Laterality: N/A;  . TONSILLECTOMY    . WISDOM TOOTH EXTRACTION      There were no vitals filed for this visit.   Subjective Assessment - 05/02/20 1106    Subjective No changes, no falls.    Patient is accompained by: Family member    Pertinent History PMH: HTN, paroxysmal a fib    Limitations Standing;Walking    Patient Stated Goals "wants everything working on R side again"    Currently in Pain? No/denies                             [facility] Adult PT Treatment/Exercise - 05/02/20 0001      Transfers   Transfers Sit to Stand;Stand to Sit    Sit to Stand 4: Min guard    Sit to Stand Details Verbal cues for technique    Sit to Stand Details (indicate cue type  and reason) verbal cues for proper UE placement to push off from w/c and mat vs. RW, needs min guard initially in standing for steadying for weight bearing through RLE    Stand to Sit 4: Min guard    Stand to Sit Details (indicate cue type and reason) Verbal cues for technique    Stand to Sit Details to remove hand orthosis prior to sitting     Squat Pivot Transfers 4: Min guard    Squat Pivot Transfer Details (indicate cue type and reason) from w/c > mat table    Comments x10 reps sit <> stands with 2" block under LLE for incr weight bearing/shifting towards RLE: single UE support to stand, attempting to sit with no UE support for eccentric control to mat table,  needing LUE to help slow descent, min A for balance. verbal cues to shift towards R and activate R quad in standing       Ambulation/Gait   Ambulation/Gait Yes    Ambulation/Gait Assistance 3: Mod assist   x1   Ambulation/Gait Assistance Details continued gait training with R ottobock anterior AFO with t strap and R hand orthosis. trialed dance sock today with pt able to demo improved foot clearance. utilized a theraband with colored targets towards front of RW as visual cue for wider BOS and step length with RLE, pt demonstrating less instances of incr narrow BOS and crossing over, primarily needing assist from therapist for proper R foot placement due to too much ER from the hip, verbal and tactile cues for R quad activation in stance, during last bout of gait - therapist not assisting with foot placement, pt able to clear foot each time, however in stance was in a more externally rotated position    Ambulation Distance (Feet) 230 Feet   x1, 100' x 1   Assistive device Rolling walker   R hand orthosis, R anterior ottobock AFO   Gait Pattern Step-to pattern;Step-through pattern;Decreased stance time - right;Decreased step length - right;Decreased step length - left;Right flexed knee in stance    Ambulation Surface Level;Indoor      Neuro Re-ed    Neuro Re-ed Details  standing with LUE on chair: 2 x 10 reps weight shifting to R (tactile and verbal cues for R quad and glute activation) stepping LLE out and back in       Knee/Hip Exercises: Aerobic   Stepper SciFit with BLE only x3 minutes at gear 3.0, verbal and tactile cues for RLE to be in proper alignment, use of blocks behind w/c to prevent from tipping and therapist also assisting,                     PT Short Term Goals - 04/20/20 1217      PT SHORT TERM GOAL #1   Title Pt will perform sit <> stand with RW with supervision in order to demo decr caregiver burden and improved functional BLE strength/balance. ALL STGS DUE 05/18/20      Baseline currently min guard    Time 4    Period Weeks    Status New    Target Date 05/18/20      PT SHORT TERM GOAL #2   Title Pt will perform sit > supine with supervision and supine > sit with min guard for bed mobility in order to decr caregiver burden.    Baseline 04/15/20 supervision sit to supine, min assist supine to sit as did not get right leg off  mat    Time 4    Period Weeks    Status On-going      PT SHORT TERM GOAL #3   Title Pt will undergo assessment of AFO for functional transfers and gait.    Baseline Orthotist came last sesssion but did not feel pt tone stable enough to pursue brace yet. Will continue to assess need.    Time 4    Period Weeks    Status On-going      PT SHORT TERM GOAL #4   Title Pt will ambulate 58' with RW with mod A x1 in order to improve functional mobility.    Baseline 04/15/20 Mod assist with 2nd person CGA 230' with RW. Ability does vary    Time 4    Period Weeks    Status Revised      PT SHORT TERM GOAL #6   Title Pt will perform squat pivot transfers from w/c <> mat table with supervision.    Baseline CGA w/c to/from mat 04/15/20    Time 4    Period Weeks    Status Revised             PT Long Term Goals - 03/16/20 1139      PT LONG TERM GOAL #1   Title Pt and pt's family members will be independent with final HEP in order to build upon functional gains made in therapy. ALL LTGS DUE 06/08/20    Time 12    Period Weeks    Status New    Target Date 06/08/20      PT LONG TERM GOAL #2   Title Pt will ambulate 115' with min guard/min A with RW and R AFO in order to improve household mobility.    Time 12    Period Weeks    Status New      PT LONG TERM GOAL #3   Title Pt will perform sit <> stand with RW with min guard in order to improve functional BLE strength for transfers.    Time 12    Period Weeks    Status New      PT LONG TERM GOAL #4   Title Pt will perform squat pivot transfer with mod I vs. stand pivot transfer  with RW with min guard in order to decr caregiver burden.    Time 12    Period Weeks    Status New                 Plan - 05/02/20 1240    Clinical Impression Statement Continued to focus on RLE NMR for strengthening and weight bearing and gait training with RW (R anterior ottobock AFO and R hand orthosis). pt with improved foot clearance with RLE and demonstrated less episodes of RLE crossing over today (did seem to improve with use of theraband for visual cue for foot placement). Therapist needing to assist with foot placement at times due to too much external rotation at the foot coming from pt's hip. Pt now able to consistently ambulate with 1 person assist (needed 2 previously for balance). Therapist to reach out to orthotist to return for a re-assessment of AFO. Will continue to progress towards LTGs.    Personal Factors and Comorbidities Comorbidity 3+    Comorbidities dense L MCA CVA, paroxysmal A. fib on chronic Xarelto, nonischemic cardiomyopathy and hypertension.    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  dog, works as an Electrical engineer Evolving/Moderate complexity    Rehab Potential Good    PT Frequency 2x / week    PT Duration 12 weeks    PT Treatment/Interventions ADLs/Self Care Home Management;Aquatic Therapy;DME Instruction;Gait training;Stair training;Functional mobility training;Therapeutic activities;Therapeutic exercise;Electrical Stimulation;Balance training;Neuromuscular re-education;Wheelchair mobility training;Orthotic Fit/Training;Patient/family education;Passive range of motion;Energy conservation    PT Next Visit Plan i contacted chris to see when he can come back - waiting to hear back. hip ABD/quad strengthening, weight shifting/NMR to RLE. continue  gait training with brace, t-strap and hand orthotic, bioness to RLE (ant tib and quad) for strengthening/muscle re-ed    Consulted and Agree with Plan of Care Patient;Family member/caregiver           Patient will benefit from skilled therapeutic intervention in order to improve the following deficits and impairments:  Abnormal gait, Decreased activity tolerance, Decreased balance, Decreased cognition, Decreased mobility, Decreased coordination, Decreased range of motion, Decreased endurance, Decreased strength, Hypomobility, Difficulty walking, Impaired UE functional use, Impaired vision/preception, Postural dysfunction, Impaired sensation  Visit Diagnosis: Other lack of coordination  Muscle weakness (generalized)  Difficulty in walking, not elsewhere classified  Hemiplegia and hemiparesis following cerebral infarction affecting right dominant side (HCC)  Other symptoms and signs involving the nervous system  Other abnormalities of gait and mobility  Unsteadiness on feet     Problem List Patient Active Problem List   Diagnosis Date Noted  . Abnormality of gait 04/28/2020  . Neurogenic bladder 03/17/2020  . Sepsis due to pneumonia ([place]) 03/06/2020  . History of stroke with residual deficit 03/06/2020  . Transient hypotension 03/06/2020  . Spastic hemiplegia affecting nondominant side ([place])   . History of hypertension   . Expressive aphasia   . AKI (acute kidney injury) ([place])   . Global aphasia   . Hypoalbuminemia due to protein-calorie malnutrition ([place])   . Dysphagia, post-stroke   . Hyperlipidemia 02/02/2020  . Dysphagia following cerebral infarction 02/02/2020  . Aspiration pneumonia ([place]) 02/02/2020  . Acute ischemic left MCA stroke ([place]) 02/02/2020  . Acute respiratory failure ([place])   . Acute ischemic stroke [facility]) s/p clot retrieval L MCA & ACA A3 01/27/2020  . Aspiration into airway 01/27/2020  . Chronic anticoagulation 01/27/2020  . Paroxysmal atrial  fibrillation ([place]) 06/15/2018  . Essential hypertension 06/15/2018  . NICM (nonischemic cardiomyopathy) ([place]) 06/15/2018  . Visit for monitoring Tikosyn therapy 06/12/2018    Arliss Journey, PT, DPT 05/02/2020, 12:43 PM  [facility] [address] [place], [place], [address] Phone: [phone]   Fax:  [phone]  Name: MAVRIK BYNUM MRN: [id number] Date of Birth: 06-15-1956

## 2020-05-02 NOTE — Therapy (Signed)
[facility] [address] [place], [place], [address] Phone: [phone]   Fax:  [phone]  Occupational Therapy Treatment  Patient Details  Name: Dustin Schneider MRN: [id number] Date of Birth: 10-Nov-1955 No data recorded  Encounter Date: 05/02/2020   OT End of Session - 05/02/20 1032    Visit Number 12    Number of Visits 25    Date for OT Re-Evaluation 06/23/20    Authorization Type BC/BS - No auth required, covered 100%    OT Start Time 0935    OT Stop Time 1015    OT Time Calculation (min) 40 min    Activity Tolerance Patient tolerated treatment well    Behavior During Therapy [facility] for tasks assessed/performed           Past Medical History:  Diagnosis Date  . Hypertension   . NICM (nonischemic cardiomyopathy) ([place]) 06/15/2018   04/23/18: EF 45%, 09/15/18: NOrmal LVEF  . Paroxysmal atrial fibrillation ([place]) 06/15/2018  . Stroke ([place])   . Visit for monitoring Tikosyn therapy 06/12/2018    Past Surgical History:  Procedure Laterality Date  . BUBBLE STUDY  03/11/2020   Procedure: BUBBLE STUDY;  Surgeon: [name], MD;  Location: [facility];  Service: Cardiovascular;;  . CARDIOVERSION N/A 05/13/2018   Procedure: CARDIOVERSION;  Surgeon: [name], MD;  Location: [facility] ENDOSCOPY;  Service: Cardiovascular;  Laterality: N/A;  . CARDIOVERSION N/A 06/13/2018   Procedure: CARDIOVERSION;  Surgeon: [name], MD;  Location: [facility] ENDOSCOPY;  Service: Cardiovascular;  Laterality: N/A;  . CARDIOVERSION N/A 06/23/2018   Procedure: CARDIOVERSION;  Surgeon: [name], MD;  Location: [facility] ENDOSCOPY;  Service: Cardiovascular;  Laterality: N/A;  . CARDIOVERSION N/A 03/11/2020   Procedure: CARDIOVERSION;  Surgeon: [name], MD;  Location: [place];  Service: Cardiovascular;  Laterality: N/A;  . excision of actinic keratosis    . EYE SURGERY     eye lift  . IR CT HEAD LTD  01/27/2020  . IR PERCUTANEOUS ART  THROMBECTOMY/INFUSION INTRACRANIAL INC DIAG ANGIO  01/27/2020      . IR PERCUTANEOUS ART THROMBECTOMY/INFUSION INTRACRANIAL INC DIAG ANGIO  01/27/2020  . RADIOLOGY WITH ANESTHESIA N/A 01/27/2020   Procedure: IR WITH ANESTHESIA;  Surgeon: Radiologist, Medication, MD;  Location: [place];  Service: Radiology;  Laterality: N/A;  . TEE WITHOUT CARDIOVERSION N/A 03/11/2020   Procedure: TRANSESOPHAGEAL ECHOCARDIOGRAM (TEE);  Surgeon: [name], MD;  Location: [facility];  Service: Cardiovascular;  Laterality: N/A;  . TONSILLECTOMY    . WISDOM TOOTH EXTRACTION      There were no vitals filed for this visit.   Subjective Assessment - 05/02/20 0935    Subjective  Pt denies any pain today    Patient is accompanied by: Family member    Pertinent History Lt MCA CVA 01/27/20, readmitted to hospital with HAP and fever on 03/06/20 and released 03/11/20. PMH: paroxsymal A-fib, HTN    Limitations fall    Currently in Pain? No/denies           Worked on BUE wt bearing with sit to squat (w/ chair placed in front of patient) - pt required min assist to Rt side and to prevent ankle supination.  Progressed to wt shifts in squat position to increase weight and activation over Rt side.  Performed low to mid level reaching to pick up 1" blocks and place in bowl; followed by picking up, moving, and releasing 1.5" cones with focus on releasing - pt had more  difficulty on lower surface getting item flat before releasing secondary to insufficient wrist radial deviation. Worked on distal control in supination, wrist extension, and RD.  Pt then practiced picking up checkers and placing into Connect 4 slots (4 rows) for repetitive low level reaching RUE.  Pt asked to wheel w/c to speech using LE's - however noted pt still required assist to prevent foot/ankle supination.                        OT Short Term Goals - 04/29/20 1037      OT SHORT TERM GOAL #1   Title Independent with initial HEP    Time 4     Period Weeks    Status Achieved      OT SHORT TERM GOAL #2   Title Pt to perform low to mid level reaching of light objects RUE in prep for feeding self and bathing    Time 4    Period Weeks    Status Achieved   04/20/20     OT SHORT TERM GOAL #3   Title Pt to feed self 50% of the time with Rt dominant hand and A/E prn    Time 4    Period Weeks    Status On-going   04/20/20: able to eat finger foods     OT SHORT TERM GOAL #4   Title Pt to perform UE dressing of pullover shirt mod I level    Time 4    Period Weeks    Status Achieved   met per pt and brother     OT SHORT TERM GOAL #5   Title Pt to perform LE dressing and all bathing with no more than mod assist using A/E and strategies prn    Time 4    Period Weeks    Status Achieved   04/20/20:  set-up for bedside bath, mod A for LE dressing     OT SHORT TERM GOAL #6   Title Pt to perform toileting and toilet transfers with no more than min assist overall    Time 4    Period Weeks    Status On-going      OT SHORT TERM GOAL #7   Title Improve functional use of RUE as evidenced by performing 8 blocks on Box & Blocks test    Baseline 0    Time 4    Period Weeks    Status On-going   04/20/20:  6 blocks            OT Long Term Goals - 03/23/20 1346      OT LONG TERM GOAL #1   Title Pt to be independent with updated HEP    Time 12    Period Weeks    Status New      OT LONG TERM GOAL #2   Title Pt to perform all BADLS at mod I level    Time 12    Period Weeks    Status New      OT LONG TERM GOAL #3   Title Pt to feed self 75% or greater with Rt dominant hand and grooming 50% or more with Rt hand    Time 12    Period Weeks    Status New      OT LONG TERM GOAL #4   Title Pt to retrieve light weight objects from high shelf RUE consistently    Time 12    Period  Weeks    Status New      OT LONG TERM GOAL #5   Title Pt to demo 25 lbs grip strength or more Rt dominant hand for gripping, opening jars/containers     Time 12    Period Weeks    Status New      Long Term Additional Goals   Additional Long Term Goals Yes      OT LONG TERM GOAL #6   Title Pt to perform simple meal prep and light cleaning tasks mod I level    Time 12    Period Weeks    Status New      OT LONG TERM GOAL #7   Title Improve functional use RUE as evidenced by performing 25 blocks on Box & Blocks and performing 9 hole peg test in 90 sec or under    Time 12    Period Weeks    Status New      OT LONG TERM GOAL #8   Title Pt to returned to modified art/sculpting tasks with task modifications and A/E prn    Time 12    Period Weeks    Status New                 Plan - 05/02/20 1033    Clinical Impression Statement Pt is progressing towards goals with improving RUE function.    Occupational performance deficits (Please refer to evaluation for details): ADL's;IADL's;Work;Leisure;Social Participation    Body Structure / Function / Physical Skills ADL;ROM;Dexterity;Balance;IADL;Body mechanics;Improper spinal/pelvic alignment;Sensation;Mobility;Flexibility;Strength;Coordination;FMC;Tone;UE functional use;Decreased knowledge of use of DME;Proprioception    Rehab Potential Good    OT Frequency 2x / week    OT Duration 12 weeks    OT Treatment/Interventions Self-care/ADL training;Therapeutic exercise;Functional Mobility Training;Neuromuscular education;Manual Therapy;Splinting;Aquatic Therapy;Manual lymph drainage;Therapeutic activities;Coping strategies training;DME and/or AE instruction;Electrical Stimulation;Fluidtherapy;Passive range of motion;Patient/family education    Plan continue functional use RUE, NMR, sit to stand with focus on controlling ankle stability    Consulted and Agree with Plan of Care Patient           Patient will benefit from skilled therapeutic intervention in order to improve the following deficits and impairments:   Body Structure / Function / Physical Skills: ADL, ROM, Dexterity, Balance,  IADL, Body mechanics, Improper spinal/pelvic alignment, Sensation, Mobility, Flexibility, Strength, Coordination, FMC, Tone, UE functional use, Decreased knowledge of use of DME, Proprioception       Visit Diagnosis: Hemiplegia and hemiparesis following cerebral infarction affecting right dominant side (HCC)  Other lack of coordination  Muscle weakness (generalized)    Problem List Patient Active Problem List   Diagnosis Date Noted  . Abnormality of gait 04/28/2020  . Neurogenic bladder 03/17/2020  . Sepsis due to pneumonia ([place]) 03/06/2020  . History of stroke with residual deficit 03/06/2020  . Transient hypotension 03/06/2020  . Spastic hemiplegia affecting nondominant side ([place])   . History of hypertension   . Expressive aphasia   . AKI (acute kidney injury) ([place])   . Global aphasia   . Hypoalbuminemia due to protein-calorie malnutrition ([place])   . Dysphagia, post-stroke   . Hyperlipidemia 02/02/2020  . Dysphagia following cerebral infarction 02/02/2020  . Aspiration pneumonia ([place]) 02/02/2020  . Acute ischemic left MCA stroke ([place]) 02/02/2020  . Acute respiratory failure ([place])   . Acute ischemic stroke [facility]) s/p clot retrieval L MCA & ACA A3 01/27/2020  . Aspiration into airway 01/27/2020  . Chronic anticoagulation 01/27/2020  . Paroxysmal atrial fibrillation ([place]) 06/15/2018  .  Essential hypertension 06/15/2018  . NICM (nonischemic cardiomyopathy) ([place]) 06/15/2018  . Visit for monitoring Tikosyn therapy 06/12/2018    Carey Bullocks, OTR/L 05/02/2020, 10:34 AM  [facility] [address] [place], [place], [address] Phone: [phone]   Fax:  [phone]  Name: Dustin Schneider MRN: [id number] Date of Birth: [birth year]

## 2020-05-02 NOTE — Therapy (Signed)
[facility] [address] [place], [place], [address] Phone: [phone]   Fax:  [phone]  Speech Language Pathology Treatment  Patient Details  Name: Dustin Schneider MRN: [id number] Date of Birth: 1956-07-31 Referring Provider (SLP): Dr. Delice Lesch   Encounter Date: 05/02/2020   End of Session - 05/02/20 1307    Visit Number 12    Number of Visits 25    Date for SLP Re-Evaluation 06/15/20    SLP Start Time 0932    SLP Stop Time  1013    SLP Time Calculation (min) 41 min    Activity Tolerance Patient tolerated treatment well           Past Medical History:  Diagnosis Date  . Hypertension   . NICM (nonischemic cardiomyopathy) ([place]) 06/15/2018   04/23/18: EF 45%, 09/15/18: NOrmal LVEF  . Paroxysmal atrial fibrillation ([place]) 06/15/2018  . Stroke ([place])   . Visit for monitoring Tikosyn therapy 06/12/2018    Past Surgical History:  Procedure Laterality Date  . BUBBLE STUDY  03/11/2020   Procedure: BUBBLE STUDY;  Surgeon: [name], MD;  Location: [place];  Service: Cardiovascular;;  . CARDIOVERSION N/A 05/13/2018   Procedure: CARDIOVERSION;  Surgeon: [name], MD;  Location: [facility] ENDOSCOPY;  Service: Cardiovascular;  Laterality: N/A;  . CARDIOVERSION N/A 06/13/2018   Procedure: CARDIOVERSION;  Surgeon: [name], MD;  Location: [facility] ENDOSCOPY;  Service: Cardiovascular;  Laterality: N/A;  . CARDIOVERSION N/A 06/23/2018   Procedure: CARDIOVERSION;  Surgeon: [name], MD;  Location: [facility] ENDOSCOPY;  Service: Cardiovascular;  Laterality: N/A;  . CARDIOVERSION N/A 03/11/2020   Procedure: CARDIOVERSION;  Surgeon: [name], MD;  Location: [facility];  Service: Cardiovascular;  Laterality: N/A;  . excision of actinic keratosis    . EYE SURGERY     eye lift  . IR CT HEAD LTD  01/27/2020  . IR PERCUTANEOUS ART THROMBECTOMY/INFUSION INTRACRANIAL INC DIAG ANGIO  01/27/2020      . IR PERCUTANEOUS ART  THROMBECTOMY/INFUSION INTRACRANIAL INC DIAG ANGIO  01/27/2020  . RADIOLOGY WITH ANESTHESIA N/A 01/27/2020   Procedure: IR WITH ANESTHESIA;  Surgeon: Radiologist, Medication, MD;  Location: [place] [facility];  Service: Radiology;  Laterality: N/A;  . TEE WITHOUT CARDIOVERSION N/A 03/11/2020   Procedure: TRANSESOPHAGEAL ECHOCARDIOGRAM (TEE);  Surgeon: [name], MD;  Location: [facility];  Service: Cardiovascular;  Laterality: N/A;  . TONSILLECTOMY    . WISDOM TOOTH EXTRACTION      There were no vitals filed for this visit.   Subjective Assessment - 05/02/20 1025    Subjective "Yes, yes"    Currently in Pain? No/denies                 ADULT SLP TREATMENT - 05/02/20 1025      General Information   Behavior/Cognition Alert;Cooperative;Pleasant mood      Treatment Provided   Treatment provided Cognitive-Linquistic      Cognitive-Linquistic Treatment   Treatment focused on Aphasia;Apraxia;Patient/family/caregiver education    Skilled Treatment Dustin Schneider wishes to trial the [employer]. Targeted multimodal communication today in conversation. With set topic, written cues, Dustin Schneider provided 4 activiities/people he saw since last visit. He rerquired cues to use writing and successfully wrote 1st 1-3 letters in a word to allow me to ID what he was trying to say. He continues to required consistent extneded time to name simple items and family names. Generated sentences for home practice using information from conversation today.  Assessment / Recommendations / Plan   Plan Continue with current plan of care      Progression Toward Goals   Progression toward goals Progressing toward goals            SLP Education - 05/02/20 1304    Education Details HEP for verbal apraxia, multimodal communication            SLP Short Term Goals - 05/02/20 1305      SLP SHORT TERM GOAL #1   Title Pt will name family, friends and art items with occasional min A 8/10 with approximations allowed.     Time 1    Period Weeks    Status On-going      SLP SHORT TERM GOAL #2   Title Pt will name 7 items in personally relevant categories with occasional min A over 2 sessions    Time 1    Period Weeks    Status On-going      SLP SHORT TERM GOAL #3   Title Pt will perform 4 automatic speech tasks with rare min A.    Time 1    Period Weeks    Status On-going      SLP SHORT TERM GOAL #4   Title Pt will write personal information (name/address/phone), 5 family/friend/pet names and 7  professional words with occasional min A    Time 1    Period Weeks    Status On-going      SLP SHORT TERM GOAL #5   Title Pt will use mulitmodal communication (gesture, draw, write 1st letter etc) to augment verbal expression to meet needs at home with rare min A from family.    Time 1    Period Weeks    Status On-going            SLP Long Term Goals - 05/02/20 1306      SLP LONG TERM GOAL #1   Title Pt will verbalize 3 words to explain or describe his artwork and hobbies with occasional min A over 3 sessions, allowing for intellgible approximations    Time 7    Period Weeks    Status On-going      SLP LONG TERM GOAL #2   Title Pt and family will utilize multimodal compensations for aphasia and verbal apraxia to participate in 3 turns each of conversation with occasional min A over 2 sessions    Time 7    Period Weeks    Status On-going      SLP LONG TERM GOAL #3   Title Pt will write 3 word phrases with less than 2 errors with rare min A over 3 sessions    Time 7    Period Weeks    Status On-going      SLP LONG TERM GOAL #4   Title Reading assessment if indicated    Time 2    Period Weeks    Status On-going            Plan - 05/02/20 1305    Clinical Impression Statement moderate Broca's aphasia and severe verbal apraxia persist, affecting communication of basic wants, needs and social interactions. Ongoing training in multimodal communication. SGD was demonstrated today for pt to  consider trial due to severity of apraxia. Continue skilled ST to maximize communication for QOL, safety and reduce family burden.    Speech Therapy Frequency 2x / week    Duration --   12 weeks or 25 visits  Treatment/Interventions Environmental controls;Cueing hierarchy;SLP instruction and feedback;Compensatory strategies;Functional tasks;Cognitive reorganization;Compensatory techniques;Internal/external aids;Multimodal communcation approach;Patient/family education;Language facilitation    Potential to Achieve Goals Good    Potential Considerations Severity of impairments           Patient will benefit from skilled therapeutic intervention in order to improve the following deficits and impairments:   Aphasia  Verbal apraxia    Problem List Patient Active Problem List   Diagnosis Date Noted  . Abnormality of gait 04/28/2020  . Neurogenic bladder 03/17/2020  . Sepsis due to pneumonia ([place]) 03/06/2020  . History of stroke with residual deficit 03/06/2020  . Transient hypotension 03/06/2020  . Spastic hemiplegia affecting nondominant side ([place])   . History of hypertension   . Expressive aphasia   . AKI (acute kidney injury) ([place])   . Global aphasia   . Hypoalbuminemia due to protein-calorie malnutrition ([place])   . Dysphagia, post-stroke   . Hyperlipidemia 02/02/2020  . Dysphagia following cerebral infarction 02/02/2020  . Aspiration pneumonia ([place]) 02/02/2020  . Acute ischemic left MCA stroke ([place]) 02/02/2020  . Acute respiratory failure ([place])   . Acute ischemic stroke [facility]) s/p clot retrieval L MCA & ACA A3 01/27/2020  . Aspiration into airway 01/27/2020  . Chronic anticoagulation 01/27/2020  . Paroxysmal atrial fibrillation ([place]) 06/15/2018  . Essential hypertension 06/15/2018  . NICM (nonischemic cardiomyopathy) ([place]) 06/15/2018  . Visit for monitoring Tikosyn therapy 06/12/2018    Jaquanna Ballentine, Annye Rusk MS, CCC-SLP 05/02/2020, 1:08 PM  [facility] [address] [place], [place], [address] Phone: [phone]   Fax:  [phone]   Name: Dustin Schneider MRN: [id number] Date of Birth: [birth year]

## 2020-05-05 ENCOUNTER — Ambulatory Visit: Payer: BC Managed Care – PPO

## 2020-05-05 ENCOUNTER — Ambulatory Visit: Payer: BC Managed Care – PPO | Admitting: Occupational Therapy

## 2020-05-05 ENCOUNTER — Other Ambulatory Visit: Payer: Self-pay

## 2020-05-05 DIAGNOSIS — R482 Apraxia: Secondary | ICD-10-CM

## 2020-05-05 DIAGNOSIS — I69351 Hemiplegia and hemiparesis following cerebral infarction affecting right dominant side: Secondary | ICD-10-CM

## 2020-05-05 DIAGNOSIS — R4701 Aphasia: Secondary | ICD-10-CM

## 2020-05-05 DIAGNOSIS — R2689 Other abnormalities of gait and mobility: Secondary | ICD-10-CM

## 2020-05-05 DIAGNOSIS — M6281 Muscle weakness (generalized): Secondary | ICD-10-CM

## 2020-05-05 DIAGNOSIS — R278 Other lack of coordination: Secondary | ICD-10-CM

## 2020-05-05 NOTE — Therapy (Signed)
[facility] [address] [place], [place], [address] Phone: [phone]   Fax:  [phone]  Occupational Therapy Treatment  Patient Details  Name: Dustin Schneider MRN: [id number] Date of Birth: [birth year] No data recorded  Encounter Date: 05/05/2020   OT End of Session - 05/05/20 1124    Visit Number 13    Number of Visits 25    Date for OT Re-Evaluation 06/23/20    Authorization Type BC/BS - No auth required, covered 100%    OT Start Time 1020    OT Stop Time 1100    OT Time Calculation (min) 40 min    Activity Tolerance Patient tolerated treatment well    Behavior During Therapy [facility] for tasks assessed/performed           Past Medical History:  Diagnosis Date  . Hypertension   . NICM (nonischemic cardiomyopathy) ([place]) 06/15/2018   04/23/18: EF 45%, 09/15/18: NOrmal LVEF  . Paroxysmal atrial fibrillation ([place]) 06/15/2018  . Stroke ([place])   . Visit for monitoring Tikosyn therapy 06/12/2018    Past Surgical History:  Procedure Laterality Date  . BUBBLE STUDY  03/11/2020   Procedure: BUBBLE STUDY;  Surgeon: [name], MD;  Location: [facility];  Service: Cardiovascular;;  . CARDIOVERSION N/A 05/13/2018   Procedure: CARDIOVERSION;  Surgeon: [name], MD;  Location: [facility] ENDOSCOPY;  Service: Cardiovascular;  Laterality: N/A;  . CARDIOVERSION N/A 06/13/2018   Procedure: CARDIOVERSION;  Surgeon: [name], MD;  Location: [facility] ENDOSCOPY;  Service: Cardiovascular;  Laterality: N/A;  . CARDIOVERSION N/A 06/23/2018   Procedure: CARDIOVERSION;  Surgeon: [name], MD;  Location: [facility] ENDOSCOPY;  Service: Cardiovascular;  Laterality: N/A;  . CARDIOVERSION N/A 03/11/2020   Procedure: CARDIOVERSION;  Surgeon: [name], MD;  Location: [facility];  Service: Cardiovascular;  Laterality: N/A;  . excision of actinic keratosis    . EYE SURGERY     eye lift  . IR CT HEAD LTD  01/27/2020  . IR PERCUTANEOUS ART  THROMBECTOMY/INFUSION INTRACRANIAL INC DIAG ANGIO  01/27/2020      . IR PERCUTANEOUS ART THROMBECTOMY/INFUSION INTRACRANIAL INC DIAG ANGIO  01/27/2020  . RADIOLOGY WITH ANESTHESIA N/A 01/27/2020   Procedure: IR WITH ANESTHESIA;  Surgeon: Radiologist, Medication, MD;  Location: [facility];  Service: Radiology;  Laterality: N/A;  . TEE WITHOUT CARDIOVERSION N/A 03/11/2020   Procedure: TRANSESOPHAGEAL ECHOCARDIOGRAM (TEE);  Surgeon: [name], MD;  Location: [facility];  Service: Cardiovascular;  Laterality: N/A;  . TONSILLECTOMY    . WISDOM TOOTH EXTRACTION      There were no vitals filed for this visit.   Subjective Assessment - 05/05/20 1023    Subjective  "No pain"    Patient is accompanied by: Family member    Pertinent History Lt MCA CVA 01/27/20, readmitted to hospital with HAP and fever on 03/06/20 and released 03/11/20. PMH: paroxsymal A-fib, HTN    Limitations fall    Currently in Pain? No/denies              [facility] OT Assessment - 05/05/20 0001      Coordination   Box and Blocks Rt = 12      Hand Function   Right Hand Grip (lbs) 12.5 lbs           Pt has doubled Blocks and Box test since last tested (was 6).  Pt issued yellow putty for grip and pinch strength but instructed to open hand wide b/t each grasp.  Pt  placing large pegs in pegboard Rt hand w/ max difficulty, mod drops, and assist from Lt hand to turn peg in Rt hand for placement. Pt then removed pegs to work on pinch strength with only min difficulty.  Practiced functional reaching and working on thumb abduction in prep for grasping.                  OT Education - 05/05/20 1131    Education Details Yellow putty HEP - pt shown how to modify and to open hand big b/t each squeeze    Person(s) Educated Patient;Caregiver(s)    Methods Explanation;Demonstration;Verbal cues    Comprehension Verbalized understanding;Returned demonstration;Verbal cues required            OT Short Term Goals - 05/05/20  1125      OT SHORT TERM GOAL #1   Title Independent with initial HEP    Time 4    Period Weeks    Status Achieved      OT SHORT TERM GOAL #2   Title Pt to perform low to mid level reaching of light objects RUE in prep for feeding self and bathing    Time 4    Period Weeks    Status Achieved   04/20/20     OT SHORT TERM GOAL #3   Title Pt to feed self 50% of the time with Rt dominant hand and A/E prn    Time 4    Period Weeks    Status On-going   04/20/20: able to eat finger foods     OT SHORT TERM GOAL #4   Title Pt to perform UE dressing of pullover shirt mod I level    Time 4    Period Weeks    Status Achieved   met per pt and brother     OT SHORT TERM GOAL #5   Title Pt to perform LE dressing and all bathing with no more than mod assist using A/E and strategies prn    Time 4    Period Weeks    Status Achieved   04/20/20:  set-up for bedside bath, mod A for LE dressing     OT SHORT TERM GOAL #6   Title Pt to perform toileting and toilet transfers with no more than min assist overall    Time 4    Period Weeks    Status On-going      OT SHORT TERM GOAL #7   Title Improve functional use of RUE as evidenced by performing 8 blocks on Box & Blocks test    Baseline 0    Time 4    Period Weeks    Status Achieved   04/20/20:  6 blocks, 05/05/20: 12 blocks            OT Long Term Goals - 05/05/20 1126      OT LONG TERM GOAL #1   Title Pt to be independent with updated HEP    Time 12    Period Weeks    Status New      OT LONG TERM GOAL #2   Title Pt to perform all BADLS at mod I level    Time 12    Period Weeks    Status On-going      OT LONG TERM GOAL #3   Title Pt to feed self 75% or greater with Rt dominant hand and grooming 50% or more with Rt hand    Time 12  Period Weeks    Status New      OT LONG TERM GOAL #4   Title Pt to retrieve light weight objects from high shelf RUE consistently    Time 12    Period Weeks    Status New      OT LONG TERM GOAL #5    Title Pt to demo 25 lbs grip strength or more Rt dominant hand for gripping, opening jars/containers    Time 12    Period Weeks    Status On-going   12.5 lbs     OT LONG TERM GOAL #6   Title Pt to perform simple meal prep and light cleaning tasks mod I level    Time 12    Period Weeks    Status New      OT LONG TERM GOAL #7   Title Improve functional use RUE as evidenced by performing 25 blocks on Box & Blocks and performing 9 hole peg test in 90 sec or under    Time 12    Period Weeks    Status On-going   12 blocks     OT LONG TERM GOAL #8   Title Pt to returned to modified art/sculpting tasks with task modifications and A/E prn    Time 12    Period Weeks    Status New                 Plan - 05/05/20 1127    Clinical Impression Statement Pt has doubled score on Box & Blocks test going from 6 to 12 blocks. Pt with more functional use for simple low level tasks RUE. Pt also with more ability to speak some simple words and even sentences today.    Occupational performance deficits (Please refer to evaluation for details): ADL's;IADL's;Work;Leisure;Social Participation    Body Structure / Function / Physical Skills ADL;ROM;Dexterity;Balance;IADL;Body mechanics;Improper spinal/pelvic alignment;Sensation;Mobility;Flexibility;Strength;Coordination;FMC;Tone;UE functional use;Decreased knowledge of use of DME;Proprioception    Rehab Potential Good    OT Frequency 2x / week    OT Duration 12 weeks    OT Treatment/Interventions Self-care/ADL training;Therapeutic exercise;Functional Mobility Training;Neuromuscular education;Manual Therapy;Splinting;Aquatic Therapy;Manual lymph drainage;Therapeutic activities;Coping strategies training;DME and/or AE instruction;Electrical Stimulation;Fluidtherapy;Passive range of motion;Patient/family education    Plan continue functional use RUE (basic coordination and reaching tasks), NMR, sit to stand with focus on controlling ankle stability     Consulted and Agree with Plan of Care Patient           Patient will benefit from skilled therapeutic intervention in order to improve the following deficits and impairments:   Body Structure / Function / Physical Skills: ADL, ROM, Dexterity, Balance, IADL, Body mechanics, Improper spinal/pelvic alignment, Sensation, Mobility, Flexibility, Strength, Coordination, FMC, Tone, UE functional use, Decreased knowledge of use of DME, Proprioception       Visit Diagnosis: Hemiplegia and hemiparesis following cerebral infarction affecting right dominant side (HCC)  Other lack of coordination  Muscle weakness (generalized)    Problem List Patient Active Problem List   Diagnosis Date Noted  . Abnormality of gait 04/28/2020  . Neurogenic bladder 03/17/2020  . Sepsis due to pneumonia ([place]) 03/06/2020  . History of stroke with residual deficit 03/06/2020  . Transient hypotension 03/06/2020  . Spastic hemiplegia affecting nondominant side ([place])   . History of hypertension   . Expressive aphasia   . AKI (acute kidney injury) ([place])   . Global aphasia   . Hypoalbuminemia due to protein-calorie malnutrition ([place])   . Dysphagia, post-stroke   .  Hyperlipidemia 02/02/2020  . Dysphagia following cerebral infarction 02/02/2020  . Aspiration pneumonia ([place]) 02/02/2020  . Acute ischemic left MCA stroke ([place]) 02/02/2020  . Acute respiratory failure ([place])   . Acute ischemic stroke [facility]) s/p clot retrieval L MCA & ACA A3 01/27/2020  . Aspiration into airway 01/27/2020  . Chronic anticoagulation 01/27/2020  . Paroxysmal atrial fibrillation ([place]) 06/15/2018  . Essential hypertension 06/15/2018  . NICM (nonischemic cardiomyopathy) ([place]) 06/15/2018  . Visit for monitoring Tikosyn therapy 06/12/2018    Carey Bullocks, OTR/L 05/05/2020, 11:32 AM  [facility] [address] [place], [place], [address] Phone: [phone]   Fax:   [phone]  Name: MIKAEEL PETROW MRN: [id number] Date of Birth: 12-20-1955

## 2020-05-05 NOTE — Therapy (Signed)
[facility] [address] [place], [place], [address] Phone: [phone]   Fax:  [phone]  Speech Language Pathology Treatment  Patient Details  Name: Dustin Schneider MRN: [id number] Date of Birth: [birth year] Referring Provider (SLP): Dr. Delice Lesch   Encounter Date: 05/05/2020   End of Session - 05/05/20 1058    Visit Number 13    Number of Visits 25    Date for SLP Re-Evaluation 06/15/20    SLP Start Time 0933    SLP Stop Time  3329    SLP Time Calculation (min) 42 min    Activity Tolerance Patient tolerated treatment well           Past Medical History:  Diagnosis Date  . Hypertension   . NICM (nonischemic cardiomyopathy) ([place]) 06/15/2018   04/23/18: EF 45%, 09/15/18: NOrmal LVEF  . Paroxysmal atrial fibrillation ([place]) 06/15/2018  . Stroke ([place])   . Visit for monitoring Tikosyn therapy 06/12/2018    Past Surgical History:  Procedure Laterality Date  . BUBBLE STUDY  03/11/2020   Procedure: BUBBLE STUDY;  Surgeon: [name], MD;  Location: [facility];  Service: Cardiovascular;;  . CARDIOVERSION N/A 05/13/2018   Procedure: CARDIOVERSION;  Surgeon: [name], MD;  Location: [facility] ENDOSCOPY;  Service: Cardiovascular;  Laterality: N/A;  . CARDIOVERSION N/A 06/13/2018   Procedure: CARDIOVERSION;  Surgeon: [name], MD;  Location: [facility] ENDOSCOPY;  Service: Cardiovascular;  Laterality: N/A;  . CARDIOVERSION N/A 06/23/2018   Procedure: CARDIOVERSION;  Surgeon: [name], MD;  Location: [facility] ENDOSCOPY;  Service: Cardiovascular;  Laterality: N/A;  . CARDIOVERSION N/A 03/11/2020   Procedure: CARDIOVERSION;  Surgeon: [name], MD;  Location: [facility];  Service: Cardiovascular;  Laterality: N/A;  . excision of actinic keratosis    . EYE SURGERY     eye lift  . IR CT HEAD LTD  01/27/2020  . IR PERCUTANEOUS ART THROMBECTOMY/INFUSION INTRACRANIAL INC DIAG ANGIO  01/27/2020      . IR PERCUTANEOUS ART  THROMBECTOMY/INFUSION INTRACRANIAL INC DIAG ANGIO  01/27/2020  . RADIOLOGY WITH ANESTHESIA N/A 01/27/2020   Procedure: IR WITH ANESTHESIA;  Surgeon: Radiologist, Medication, MD;  Location: [facility];  Service: Radiology;  Laterality: N/A;  . TEE WITHOUT CARDIOVERSION N/A 03/11/2020   Procedure: TRANSESOPHAGEAL ECHOCARDIOGRAM (TEE);  Surgeon: [name], MD;  Location: [facility];  Service: Cardiovascular;  Laterality: N/A;  . TONSILLECTOMY    . WISDOM TOOTH EXTRACTION      There were no vitals filed for this visit.   Subjective Assessment - 05/05/20 1045    Subjective "hashbrowns (no hesitation or artic errors)" (pt, when spelling "hash browns" on [employer])    Patient is accompained by: Family member   [name] - BIL   Currently in Pain? No/denies                 ADULT SLP TREATMENT - 05/05/20 1046      General Information   Behavior/Cognition Alert;Cooperative;Pleasant mood      Treatment Provided   Treatment provided Cognitive-Linquistic      Cognitive-Linquistic Treatment   Treatment focused on Aphasia;Apraxia;Patient/family/caregiver education    Skilled Treatment SLP targeted multimodal communication today in conversation regarding his new practice sentences about guy's breakfast. With a question cue, (e.g., "Jim where do you meet for breakfast?") Dustin Schneider provided answers with functional artic 80% of the time when looking at the appropriate written sentence he should respond with. He correctly sequenced how to make a new icon with total  A faded to occasional min nonverbal cues. He continues to require written cues for typing icon names.      Assessment / Recommendations / Plan   Plan Continue with current plan of care      Progression Toward Goals   Progression toward goals Progressing toward goals              SLP Short Term Goals - 05/05/20 1059      SLP SHORT TERM GOAL #1   Title Pt will name family, friends and art items with occasional min A 8/10 with  approximations allowed.    Time 1    Period Weeks    Status Partially Met      SLP SHORT TERM GOAL #2   Title Pt will name 7 items in personally relevant categories with occasional min A over 2 sessions    Time 1    Period Weeks    Status Partially Met      SLP SHORT TERM GOAL #3   Title Pt will perform 4 automatic speech tasks with rare min A.    Time 1    Period Weeks    Status Partially Met      SLP SHORT TERM GOAL #4   Title Pt will write personal information (name/address/phone), 5 family/friend/pet names and 7  professional words with occasional min A    Time 1    Period Weeks    Status Not Met      SLP SHORT TERM GOAL #5   Title Pt will use mulitmodal communication (gesture, draw, write 1st letter etc) to augment verbal expression to meet needs at home with rare min A from family.    Time 1    Period Weeks    Status Partially Met            SLP Long Term Goals - 05/05/20 1100      SLP LONG TERM GOAL #1   Title Pt will verbalize 3 words to explain or describe his artwork and hobbies with occasional min A over 3 sessions, allowing for intellgible approximations    Time 7    Period Weeks    Status On-going      SLP LONG TERM GOAL #2   Title Pt and family will utilize multimodal compensations for aphasia and verbal apraxia to participate in 3 turns each of conversation with occasional min A over 2 sessions    Time 7    Period Weeks    Status On-going      SLP LONG TERM GOAL #3   Title Pt will write 3 word phrases with less than 2 errors with rare min A over 3 sessions    Time 7    Period Weeks    Status On-going      SLP LONG TERM GOAL #4   Title Reading assessment if indicated    Time 2    Period Weeks    Status On-going            Plan - 05/05/20 1058    Clinical Impression Statement moderate Broca's aphasia and severe verbal apraxia persist, affecting communication of basic wants, needs and social interactions. Ongoing training in multimodal  communication. Pt has indicated he is interested in SGD trial - SLP in process of obtaining a device for pt to trial - took pt's insurance card and copied today to upload to [place] site. Continue skilled ST to maximize communication for QOL, safety and reduce family burden.  Speech Therapy Frequency 2x / week    Duration --   12 weeks or 25 visits   Treatment/Interventions Environmental controls;Cueing hierarchy;SLP instruction and feedback;Compensatory strategies;Functional tasks;Cognitive reorganization;Compensatory techniques;Internal/external aids;Multimodal communcation approach;Patient/family education;Language facilitation    Potential to Achieve Goals Good    Potential Considerations Severity of impairments           Patient will benefit from skilled therapeutic intervention in order to improve the following deficits and impairments:   Aphasia  Verbal apraxia    Problem List Patient Active Problem List   Diagnosis Date Noted  . Abnormality of gait 04/28/2020  . Neurogenic bladder 03/17/2020  . Sepsis due to pneumonia ([place]) 03/06/2020  . History of stroke with residual deficit 03/06/2020  . Transient hypotension 03/06/2020  . Spastic hemiplegia affecting nondominant side ([place])   . History of hypertension   . Expressive aphasia   . AKI (acute kidney injury) ([place])   . Global aphasia   . Hypoalbuminemia due to protein-calorie malnutrition ([place])   . Dysphagia, post-stroke   . Hyperlipidemia 02/02/2020  . Dysphagia following cerebral infarction 02/02/2020  . Aspiration pneumonia ([place]) 02/02/2020  . Acute ischemic left MCA stroke ([place]) 02/02/2020  . Acute respiratory failure ([place])   . Acute ischemic stroke [facility]) s/p clot retrieval L MCA & ACA A3 01/27/2020  . Aspiration into airway 01/27/2020  . Chronic anticoagulation 01/27/2020  . Paroxysmal atrial fibrillation ([place]) 06/15/2018  . Essential hypertension 06/15/2018  . NICM (nonischemic cardiomyopathy) ([place])  06/15/2018  . Visit for monitoring Tikosyn therapy 06/12/2018    [facility] ,[place], [place]  05/05/2020, 11:00 AM  [facility] [address] [place], [place], [address] Phone: [phone]   Fax:  [phone]   Name: Dustin Schneider MRN: [id number] Date of Birth: 1956-03-12

## 2020-05-05 NOTE — Therapy (Signed)
[facility] [address] [place], [place], [address] Phone: [phone]   Fax:  [phone]  Physical Therapy Treatment  Patient Details  Name: Dustin Schneider MRN: [id number] Date of Birth: [birth year] Referring Provider (PT): Lauraine Rinne, PA-C (will be followed by Dr. Posey Pronto)   Encounter Date: 05/05/2020   PT End of Session - 05/05/20 1104    Visit Number 16    Number of Visits 25    Date for PT Re-Evaluation 06/14/20    Authorization Type BCBS    PT Start Time 1102    PT Stop Time 9678    PT Time Calculation (min) 43 min    Equipment Utilized During Treatment Gait belt    Activity Tolerance Patient tolerated treatment well    Behavior During Therapy [facility] for tasks assessed/performed           Past Medical History:  Diagnosis Date   Hypertension    NICM (nonischemic cardiomyopathy) ([place]) 06/15/2018   04/23/18: EF 45%, 09/15/18: NOrmal LVEF   Paroxysmal atrial fibrillation ([place]) 06/15/2018   Stroke ([place])    Visit for monitoring Tikosyn therapy 06/12/2018    Past Surgical History:  Procedure Laterality Date   BUBBLE STUDY  03/11/2020   Procedure: BUBBLE STUDY;  Surgeon: [name], MD;  Location: [facility];  Service: Cardiovascular;;   CARDIOVERSION N/A 05/13/2018   Procedure: CARDIOVERSION;  Surgeon: [name], MD;  Location: [facility];  Service: Cardiovascular;  Laterality: N/A;   CARDIOVERSION N/A 06/13/2018   Procedure: CARDIOVERSION;  Surgeon: [name], MD;  Location: [facility];  Service: Cardiovascular;  Laterality: N/A;   CARDIOVERSION N/A 06/23/2018   Procedure: CARDIOVERSION;  Surgeon: [name], MD;  Location: [facility] [place];  Service: Cardiovascular;  Laterality: N/A;   CARDIOVERSION N/A 03/11/2020   Procedure: CARDIOVERSION;  Surgeon: [name], MD;  Location: [facility];  Service: Cardiovascular;  Laterality: N/A;   excision of actinic keratosis     EYE SURGERY      eye lift   IR CT HEAD LTD  01/27/2020   IR PERCUTANEOUS ART THROMBECTOMY/INFUSION INTRACRANIAL INC DIAG ANGIO  01/27/2020       IR PERCUTANEOUS ART THROMBECTOMY/INFUSION INTRACRANIAL INC DIAG ANGIO  01/27/2020   RADIOLOGY WITH ANESTHESIA N/A 01/27/2020   Procedure: IR WITH ANESTHESIA;  Surgeon: Radiologist, Medication, MD;  Location: [facility];  Service: Radiology;  Laterality: N/A;   TEE WITHOUT CARDIOVERSION N/A 03/11/2020   Procedure: TRANSESOPHAGEAL ECHOCARDIOGRAM (TEE);  Surgeon: [name], MD;  Location: [place];  Service: Cardiovascular;  Laterality: N/A;   TONSILLECTOMY     WISDOM TOOTH EXTRACTION      There were no vitals filed for this visit.   Subjective Assessment - 05/05/20 1104    Subjective Pt reports he is doing well. Worked hard in OT.    Patient is accompained by: Family member    Pertinent History PMH: HTN, paroxysmal a fib    Limitations Standing;Walking    Patient Stated Goals "wants everything working on R side again"    Currently in Pain? No/denies                             [facility] Adult PT Treatment/Exercise - 05/05/20 1105      Transfers   Transfers Sit to Stand;Stand to Sit;Squat Pivot Transfers    Sit to Stand 4: Min guard    Sit to Stand Details Verbal cues for technique;Tactile cues for  weight shifting    Stand to Sit 4: Min guard    Stand to Sit Details (indicate cue type and reason) Verbal cues for technique    Squat Pivot Transfers 4: Min guard    Squat Pivot Transfer Details (indicate cue type and reason) mat to w/c to left.      Ambulation/Gait   Ambulation/Gait Yes    Ambulation/Gait Assistance 4: Min assist;3: Mod assist    Ambulation/Gait Assistance Details t strap on AFO for added ankle stability. Occasional assist for foot placement on right. PT also provided verbal and tactile cues to tighten right quad.     Ambulation Distance (Feet) 230 Feet    Assistive device Rolling walker   right hand grip attachment, right  ottobock anterior AFO    Gait Pattern Step-through pattern;Decreased hip/knee flexion - right;Decreased stance time - right    Ambulation Surface Level;Indoor    Gait Comments BP=110/72 after gait      Neuro Re-ed    Neuro Re-ed Details  Standing at walker: stepping forward and back x 10 with RLE with PT assisting min assist to extend back then stepping forward and back with LLE x 10 to facilitate right weight shift with PT providing tactile cues at right knee.  Sit to stands with 2" step under left foot x 7 with PT assisting to stabilize right knee and facilitate right weight shift. Pt was cued to try to utilize right arm some to rise and sit to assist as able to prevent turning to left more. Then standing with LLE on 2" step reaching overhead with LUE x 10 and with RUE x 2. Pt cued to really push through right leg to extend with tactile cues and PT stabilizing right knee for safety.      Exercises   Exercises Other Exercises    Other Exercises  Seated right hamstring curls on foam roll 10 x 2.                    PT Short Term Goals - 04/20/20 1217      PT SHORT TERM GOAL #1   Title Pt will perform sit <> stand with RW with supervision in order to demo decr caregiver burden and improved functional BLE strength/balance. ALL STGS DUE 05/18/20    Baseline currently min guard    Time 4    Period Weeks    Status New    Target Date 05/18/20      PT SHORT TERM GOAL #2   Title Pt will perform sit > supine with supervision and supine > sit with min guard for bed mobility in order to decr caregiver burden.    Baseline 04/15/20 supervision sit to supine, min assist supine to sit as did not get right leg off mat    Time 4    Period Weeks    Status On-going      PT SHORT TERM GOAL #3   Title Pt will undergo assessment of AFO for functional transfers and gait.    Baseline Orthotist came last sesssion but did not feel pt tone stable enough to pursue brace yet. Will continue to assess need.     Time 4    Period Weeks    Status On-going      PT SHORT TERM GOAL #4   Title Pt will ambulate 115 with RW with mod A x1 in order to improve functional mobility.    Baseline 04/15/20 Mod assist with 2nd person  CGA 230' with RW. Ability does vary    Time 4    Period Weeks    Status Revised      PT SHORT TERM GOAL #6   Title Pt will perform squat pivot transfers from w/c <> mat table with supervision.    Baseline CGA w/c to/from mat 04/15/20    Time 4    Period Weeks    Status Revised             PT Long Term Goals - 03/16/20 1139      PT LONG TERM GOAL #1   Title Pt and pt's family members will be independent with final HEP in order to build upon functional gains made in therapy. ALL LTGS DUE 06/08/20    Time 12    Period Weeks    Status New    Target Date 06/08/20      PT LONG TERM GOAL #2   Title Pt will ambulate 115' with min guard/min A with RW and R AFO in order to improve household mobility.    Time 12    Period Weeks    Status New      PT LONG TERM GOAL #3   Title Pt will perform sit <> stand with RW with min guard in order to improve functional BLE strength for transfers.    Time 12    Period Weeks    Status New      PT LONG TERM GOAL #4   Title Pt will perform squat pivot transfer with mod I vs. stand pivot transfer with RW with min guard in order to decr caregiver burden.    Time 12    Period Weeks    Status New                 Plan - 05/05/20 1329    Clinical Impression Statement Pt able to progress gait with less assistance mostly for placement on right foot at times and to facilitate. [name] RLE does ER some with gait. Pt showing improving right quad activation with activities today with cuing.    Personal Factors and Comorbidities Comorbidity 3+    Comorbidities dense L MCA CVA, paroxysmal A. fib on chronic Xarelto, nonischemic cardiomyopathy and hypertension.    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, works as an Electrical engineer Evolving/Moderate complexity    Rehab Potential Good    PT Frequency 2x / week    PT Duration 12 weeks    PT Treatment/Interventions ADLs/Self Care Home Management;Aquatic Therapy;DME Instruction;Gait training;Stair training;Functional mobility training;Therapeutic activities;Therapeutic exercise;Electrical Stimulation;Balance training;Neuromuscular re-education;Wheelchair mobility training;Orthotic Fit/Training;Patient/family education;Passive range of motion;Energy conservation    PT Next Visit Plan i contacted chris to see when he can come back - waiting to hear back. hip ABD/quad strengthening, weight shifting/NMR to RLE. continue gait training with brace, t-strap and hand orthotic, bioness to RLE (ant tib and quad) for strengthening/muscle re-ed    Consulted and Agree with Plan of Care Patient;Family member/caregiver           Patient will benefit from skilled therapeutic intervention in order to improve the following deficits and impairments:  Abnormal gait, Decreased activity tolerance, Decreased balance, Decreased cognition, Decreased mobility, Decreased coordination, Decreased range of motion, Decreased endurance, Decreased strength, Hypomobility, Difficulty walking, Impaired UE functional use, Impaired vision/preception, Postural dysfunction, Impaired sensation  Visit Diagnosis: Other abnormalities of gait and mobility  Muscle weakness (generalized)  Hemiplegia and hemiparesis following cerebral infarction affecting right dominant side [facility])     Problem List Patient Active Problem List   Diagnosis Date Noted   Abnormality of gait 04/28/2020   Neurogenic bladder 03/17/2020   Sepsis due to pneumonia ([place]) 03/06/2020   History of  stroke with residual deficit 03/06/2020   Transient hypotension 03/06/2020   Spastic hemiplegia affecting nondominant side ([place])    History of hypertension    Expressive aphasia    AKI (acute kidney injury) ([place])    Global aphasia    Hypoalbuminemia due to protein-calorie malnutrition ([place])    Dysphagia, post-stroke    Hyperlipidemia 02/02/2020   Dysphagia following cerebral infarction 02/02/2020   Aspiration pneumonia ([place]) 02/02/2020   Acute ischemic left MCA stroke ([place]) 02/02/2020   Acute respiratory failure ([place])    Acute ischemic stroke ([place]) s/p clot retrieval L MCA & ACA A3 01/27/2020   Aspiration into airway 01/27/2020   Chronic anticoagulation 01/27/2020   Paroxysmal atrial fibrillation ([place]) 06/15/2018   Essential hypertension 06/15/2018   NICM (nonischemic cardiomyopathy) ([place]) 06/15/2018   Visit for monitoring Tikosyn therapy 06/12/2018    Electa Sniff, PT, DPT, NCS 05/05/2020, 1:34 PM  [facility] [address] [place], [place], [address] Phone: [phone]   Fax:  [phone]  Name: Dustin Schneider MRN: [id number] Date of Birth: [birth year]

## 2020-05-09 ENCOUNTER — Ambulatory Visit: Payer: BC Managed Care – PPO

## 2020-05-09 ENCOUNTER — Ambulatory Visit: Payer: BC Managed Care – PPO | Admitting: Occupational Therapy

## 2020-05-09 ENCOUNTER — Other Ambulatory Visit: Payer: Self-pay

## 2020-05-09 ENCOUNTER — Ambulatory Visit: Payer: BC Managed Care – PPO | Admitting: Speech Pathology

## 2020-05-09 ENCOUNTER — Encounter: Payer: Self-pay | Admitting: Speech Pathology

## 2020-05-09 DIAGNOSIS — R2689 Other abnormalities of gait and mobility: Secondary | ICD-10-CM

## 2020-05-09 DIAGNOSIS — I69351 Hemiplegia and hemiparesis following cerebral infarction affecting right dominant side: Secondary | ICD-10-CM | POA: Diagnosis not present

## 2020-05-09 DIAGNOSIS — R278 Other lack of coordination: Secondary | ICD-10-CM

## 2020-05-09 DIAGNOSIS — R482 Apraxia: Secondary | ICD-10-CM

## 2020-05-09 DIAGNOSIS — M6281 Muscle weakness (generalized): Secondary | ICD-10-CM

## 2020-05-09 DIAGNOSIS — R4701 Aphasia: Secondary | ICD-10-CM

## 2020-05-09 DIAGNOSIS — R2681 Unsteadiness on feet: Secondary | ICD-10-CM

## 2020-05-09 NOTE — Patient Instructions (Signed)
Access Code: [email or web address] URL: [name] Date: 05/09/2020 Prepared by: Cherly Anderson  Exercises Supine Hip and Knee Flexion PROM with Caregiver - 2 x daily - 5 x weekly - 2 sets - 10 reps Supine Bridge - 1 x daily - 5 x weekly - 2 sets - 10 reps Supine Knee Extension Strengthening - 1 x daily - 5 x weekly - 2 sets - 10 reps Supine Hip Internal and External Rotation - 2 x daily - 7 x weekly - 2 sets - 10 reps Supine Hip Adductor Stretch with Caregiver - 1 x daily - 7 x weekly - 3 sets - 30 hold Bent Knee Fallouts - 1 x daily - 7 x weekly - 2 sets - 10 reps Seated Hip Adduction Isometrics with Ball - 3 x daily - 7 x weekly - 2 sets - 10 reps - 5 hold

## 2020-05-09 NOTE — Therapy (Signed)
[facility] [address] [place], [place], [address] Phone: [phone]   Fax:  [phone]  Speech Language Pathology Treatment  Patient Details  Name: Dustin Schneider MRN: [id number] Date of Birth: Mar 11, 1956 Referring Provider (SLP): Dr. Delice Lesch   Encounter Date: 05/09/2020   End of Session - 05/09/20 1133    Visit Number 14    Number of Visits 25    Date for SLP Re-Evaluation 06/15/20    SLP Start Time 0930    SLP Stop Time  1013    SLP Time Calculation (min) 43 min    Activity Tolerance Patient tolerated treatment well           Past Medical History:  Diagnosis Date  . Hypertension   . NICM (nonischemic cardiomyopathy) ([place]) 06/15/2018   04/23/18: EF 45%, 09/15/18: NOrmal LVEF  . Paroxysmal atrial fibrillation ([place]) 06/15/2018  . Stroke ([place])   . Visit for monitoring Tikosyn therapy 06/12/2018    Past Surgical History:  Procedure Laterality Date  . BUBBLE STUDY  03/11/2020   Procedure: BUBBLE STUDY;  Surgeon: [name], MD;  Location: [facility];  Service: Cardiovascular;;  . CARDIOVERSION N/A 05/13/2018   Procedure: CARDIOVERSION;  Surgeon: [name], MD;  Location: [facility] ENDOSCOPY;  Service: Cardiovascular;  Laterality: N/A;  . CARDIOVERSION N/A 06/13/2018   Procedure: CARDIOVERSION;  Surgeon: [name], MD;  Location: [facility] ENDOSCOPY;  Service: Cardiovascular;  Laterality: N/A;  . CARDIOVERSION N/A 06/23/2018   Procedure: CARDIOVERSION;  Surgeon: [name], MD;  Location: [facility] ENDOSCOPY;  Service: Cardiovascular;  Laterality: N/A;  . CARDIOVERSION N/A 03/11/2020   Procedure: CARDIOVERSION;  Surgeon: [name], MD;  Location: [facility];  Service: Cardiovascular;  Laterality: N/A;  . excision of actinic keratosis    . EYE SURGERY     eye lift  . IR CT HEAD LTD  01/27/2020  . IR PERCUTANEOUS ART THROMBECTOMY/INFUSION INTRACRANIAL INC DIAG ANGIO  01/27/2020      . IR PERCUTANEOUS ART  THROMBECTOMY/INFUSION INTRACRANIAL INC DIAG ANGIO  01/27/2020  . RADIOLOGY WITH ANESTHESIA N/A 01/27/2020   Procedure: IR WITH ANESTHESIA;  Surgeon: Radiologist, Medication, MD;  Location: [facility];  Service: Radiology;  Laterality: N/A;  . TEE WITHOUT CARDIOVERSION N/A 03/11/2020   Procedure: TRANSESOPHAGEAL ECHOCARDIOGRAM (TEE);  Surgeon: [name], MD;  Location: [facility];  Service: Cardiovascular;  Laterality: N/A;  . TONSILLECTOMY    . WISDOM TOOTH EXTRACTION      There were no vitals filed for this visit.   Subjective Assessment - 05/09/20 0939    Subjective Shustating "frustrating"    Patient is accompained by: Family member   [name], BIL   Currently in Pain? Yes                 ADULT SLP TREATMENT - 05/09/20 1029      General Information   Behavior/Cognition Alert;Cooperative;Pleasant mood      Pain Assessment   Pain Assessment No/denies pain      Cognitive-Linquistic Treatment   Treatment focused on Aphasia;Apraxia;Patient/family/caregiver education    Skilled Treatment Dustin Schneider required frequent max A to communicate an incident where his wheelchair got away from his s/o on a hill. He spontaneously used gestures to indicate an incline, however required cues to use writing. 3x when writing 1st 2-3 letters, Dustin Schneider was able to say the word. Spelling at word level required max A frequently, however, Dustin Schneider can use the first few letters as a clue for his listener. Today,  we also targeted strategies to ""pre-plan" topics or information he wants to talk about with his breakfast group or visitors. Instructed familhy to write down key words or sentences that Dustin Schneider can bring with him in a mini notebook to aid in communcation with friends. He is to pre-practice these utterances prior to the visits      Assessment / Recommendations / [place] with current plan of care      Progression Toward Goals   Progression toward goals Progressing toward goals            SLP Education -  05/09/20 1130    Education Details compensations for verbal apraxia and aphasia    Person(s) Educated Patient;Caregiver(s)   [name], BIL   Methods Explanation;Demonstration;Verbal cues;Handout    Comprehension Verbalized understanding;Verbal cues required;Need further instruction            SLP Short Term Goals - 05/09/20 1132      SLP SHORT TERM GOAL #1   Title Pt will name family, friends and art items with occasional min A 8/10 with approximations allowed.    Time 1    Period Weeks    Status Partially Met      SLP SHORT TERM GOAL #2   Title Pt will name 7 items in personally relevant categories with occasional min A over 2 sessions    Time 1    Period Weeks    Status Partially Met      SLP SHORT TERM GOAL #3   Title Pt will perform 4 automatic speech tasks with rare min A.    Time 1    Period Weeks    Status Partially Met      SLP SHORT TERM GOAL #4   Title Pt will write personal information (name/address/phone), 5 family/friend/pet names and 7  professional words with occasional min A    Time 1    Period Weeks    Status Not Met      SLP SHORT TERM GOAL #5   Title Pt will use mulitmodal communication (gesture, draw, write 1st letter etc) to augment verbal expression to meet needs at home with rare min A from family.    Time 1    Period Weeks    Status Partially Met            SLP Long Term Goals - 05/09/20 1132      SLP LONG TERM GOAL #1   Title Pt will verbalize 3 words to explain or describe his artwork and hobbies with occasional min A over 3 sessions, allowing for intellgible approximations    Time 6    Period Weeks    Status On-going      SLP LONG TERM GOAL #2   Title Pt and family will utilize multimodal compensations for aphasia and verbal apraxia to participate in 3 turns each of conversation with occasional min A over 2 sessions    Time 6    Period Weeks    Status On-going      SLP LONG TERM GOAL #3   Title Pt will write 3 word phrases with  less than 2 errors with rare min A over 3 sessions    Time 6    Period Weeks    Status On-going      SLP LONG TERM GOAL #4   Title Reading assessment if indicated    Time 2    Period Weeks    Status On-going  Plan - 05/09/20 1132    Clinical Impression Statement moderate Broca's aphasia and severe verbal apraxia persist, affecting communication of basic wants, needs and social interactions. Ongoing training in multimodal communication. Pt has indicated he is interested in SGD trial - SLP in process of obtaining a device for pt to trial - took pt's insurance card and copied today to upload to [facility] site. Continue skilled ST to maximize communication for QOL, safety and reduce family burden.    Speech Therapy Frequency 2x / week    Duration --   12 weeks or 25 visits   Treatment/Interventions Environmental controls;Cueing hierarchy;SLP instruction and feedback;Compensatory strategies;Functional tasks;Cognitive reorganization;Compensatory techniques;Internal/external aids;Multimodal communcation approach;Patient/family education;Language facilitation    Potential to Achieve Goals Good    Potential Considerations Severity of impairments           Patient will benefit from skilled therapeutic intervention in order to improve the following deficits and impairments:   Aphasia  Verbal apraxia    Problem List Patient Active Problem List   Diagnosis Date Noted  . Abnormality of gait 04/28/2020  . Neurogenic bladder 03/17/2020  . Sepsis due to pneumonia ([place]) 03/06/2020  . History of stroke with residual deficit 03/06/2020  . Transient hypotension 03/06/2020  . Spastic hemiplegia affecting nondominant side ([place])   . History of hypertension   . Expressive aphasia   . AKI (acute kidney injury) ([place])   . Global aphasia   . Hypoalbuminemia due to protein-calorie malnutrition ([place])   . Dysphagia, post-stroke   . Hyperlipidemia 02/02/2020  . Dysphagia following  cerebral infarction 02/02/2020  . Aspiration pneumonia ([place]) 02/02/2020  . Acute ischemic left MCA stroke ([place]) 02/02/2020  . Acute respiratory failure ([place])   . Acute ischemic stroke [facility]) s/p clot retrieval L MCA & ACA A3 01/27/2020  . Aspiration into airway 01/27/2020  . Chronic anticoagulation 01/27/2020  . Paroxysmal atrial fibrillation ([place]) 06/15/2018  . Essential hypertension 06/15/2018  . NICM (nonischemic cardiomyopathy) ([place]) 06/15/2018  . Visit for monitoring Tikosyn therapy 06/12/2018    Dustin Schneider, Annye Rusk MS, CCC-SLP 05/09/2020, 11:34 AM  [facility] [address] [place], [place], [address] Phone: [phone]   Fax:  [phone]   Name: Dustin Schneider MRN: [id number] Date of Birth: [birth year]

## 2020-05-09 NOTE — Patient Instructions (Signed)
  Pre-practice some conversation topics and write down key words or sentences before you go to breakfast or meet a colleague   Keep a mini notebook with the words or sentences written down to use as a cheat sheet when you are having a visit or at breakfast with the guys  Bring a pencil to write something down to help others know what you want to say  Or write and practice questions you want to ask your friends or family  Also, when you do aphasia group, family can write some words in the background to help you

## 2020-05-09 NOTE — Therapy (Signed)
[facility] [address] [place], [place], [address] Phone: [phone]   Fax:  [phone]  Occupational Therapy Treatment  Patient Details  Name: Dustin Schneider MRN: [id number] Date of Birth: 10/31/1955 No data recorded  Encounter Date: 05/09/2020   OT End of Session - 05/09/20 1219    Visit Number 14    Number of Visits 25    Date for OT Re-Evaluation 06/23/20    Authorization Type BC/BS - No auth required, covered 100%    OT Start Time 1015    OT Stop Time 1100    OT Time Calculation (min) 45 min    Activity Tolerance Patient tolerated treatment well    Behavior During Therapy [facility] for tasks assessed/performed           Past Medical History:  Diagnosis Date  . Hypertension   . NICM (nonischemic cardiomyopathy) ([place]) 06/15/2018   04/23/18: EF 45%, 09/15/18: NOrmal LVEF  . Paroxysmal atrial fibrillation ([place]) 06/15/2018  . Stroke ([place])   . Visit for monitoring Tikosyn therapy 06/12/2018    Past Surgical History:  Procedure Laterality Date  . BUBBLE STUDY  03/11/2020   Procedure: BUBBLE STUDY;  Surgeon: [name], MD;  Location: [facility];  Service: Cardiovascular;;  . CARDIOVERSION N/A 05/13/2018   Procedure: CARDIOVERSION;  Surgeon: [name], MD;  Location: [facility] ENDOSCOPY;  Service: Cardiovascular;  Laterality: N/A;  . CARDIOVERSION N/A 06/13/2018   Procedure: CARDIOVERSION;  Surgeon: [name], MD;  Location: [facility] ENDOSCOPY;  Service: Cardiovascular;  Laterality: N/A;  . CARDIOVERSION N/A 06/23/2018   Procedure: CARDIOVERSION;  Surgeon: [name], MD;  Location: [facility] ENDOSCOPY;  Service: Cardiovascular;  Laterality: N/A;  . CARDIOVERSION N/A 03/11/2020   Procedure: CARDIOVERSION;  Surgeon: [name], MD;  Location: [facility];  Service: Cardiovascular;  Laterality: N/A;  . excision of actinic keratosis    . EYE SURGERY     eye lift  . IR CT HEAD LTD  01/27/2020  . IR PERCUTANEOUS ART  THROMBECTOMY/INFUSION INTRACRANIAL INC DIAG ANGIO  01/27/2020      . IR PERCUTANEOUS ART THROMBECTOMY/INFUSION INTRACRANIAL INC DIAG ANGIO  01/27/2020  . RADIOLOGY WITH ANESTHESIA N/A 01/27/2020   Procedure: IR WITH ANESTHESIA;  Surgeon: Radiologist, Medication, MD;  Location: [facility];  Service: Radiology;  Laterality: N/A;  . TEE WITHOUT CARDIOVERSION N/A 03/11/2020   Procedure: TRANSESOPHAGEAL ECHOCARDIOGRAM (TEE);  Surgeon: [name], MD;  Location: [facility];  Service: Cardiovascular;  Laterality: N/A;  . TONSILLECTOMY    . WISDOM TOOTH EXTRACTION      There were no vitals filed for this visit.   Subjective Assessment - 05/09/20 1021    Subjective  "No pain and no falls"    Patient is accompanied by: Family member    Pertinent History Lt MCA CVA 01/27/20, readmitted to hospital with HAP and fever on 03/06/20 and released 03/11/20. PMH: paroxsymal A-fib, HTN    Limitations fall    Currently in Pain? No/denies           Transfer w/c to mat w/ min assist - pt does more a squat pivot transfer secondary to Rt ankle weakness (goes into supination).  Worked on sit to squat position w/ wt bearing over BUE's (chair in front of patient) w/ A/P wt shifts, followed by disengaging LUE to increase wt over RUE. Also encouraged more weight through RLE with transitions b/t sit to squat. Standing w/ ankle supported - worked on wt shifts and stepping in prep  for walking with support to Rt ankle.  Practiced placing white pegs in semicircular pegboard with mod assist to manipulate peg in hand, then removing pegs w/o assist. Practiced picking up and stacking 1" blocks in stacks of 3 and removing 1 at a time for control w/ assist from therapist for distal stability for stacking and removing 3rd block.  Simulated eating with built up spoon using dried beans and focused on proper movement pattern including sufficient forearm supination to prevent spillage. Pt required min assist initially but could demo I'ly at end  w/ less spills after repetition.                        OT Short Term Goals - 05/05/20 1125      OT SHORT TERM GOAL #1   Title Independent with initial HEP    Time 4    Period Weeks    Status Achieved      OT SHORT TERM GOAL #2   Title Pt to perform low to mid level reaching of light objects RUE in prep for feeding self and bathing    Time 4    Period Weeks    Status Achieved   04/20/20     OT SHORT TERM GOAL #3   Title Pt to feed self 50% of the time with Rt dominant hand and A/E prn    Time 4    Period Weeks    Status On-going   04/20/20: able to eat finger foods     OT SHORT TERM GOAL #4   Title Pt to perform UE dressing of pullover shirt mod I level    Time 4    Period Weeks    Status Achieved   met per pt and brother     OT SHORT TERM GOAL #5   Title Pt to perform LE dressing and all bathing with no more than mod assist using A/E and strategies prn    Time 4    Period Weeks    Status Achieved   04/20/20:  set-up for bedside bath, mod A for LE dressing     OT SHORT TERM GOAL #6   Title Pt to perform toileting and toilet transfers with no more than min assist overall    Time 4    Period Weeks    Status On-going      OT SHORT TERM GOAL #7   Title Improve functional use of RUE as evidenced by performing 8 blocks on Box & Blocks test    Baseline 0    Time 4    Period Weeks    Status Achieved   04/20/20:  6 blocks, 05/05/20: 12 blocks            OT Long Term Goals - 05/05/20 1126      OT LONG TERM GOAL #1   Title Pt to be independent with updated HEP    Time 12    Period Weeks    Status New      OT LONG TERM GOAL #2   Title Pt to perform all BADLS at mod I level    Time 12    Period Weeks    Status On-going      OT LONG TERM GOAL #3   Title Pt to feed self 75% or greater with Rt dominant hand and grooming 50% or more with Rt hand    Time 12    Period Weeks  Status New      OT LONG TERM GOAL #4   Title Pt to retrieve light weight  objects from high shelf RUE consistently    Time 12    Period Weeks    Status New      OT LONG TERM GOAL #5   Title Pt to demo 25 lbs grip strength or more Rt dominant hand for gripping, opening jars/containers    Time 12    Period Weeks    Status On-going   12.5 lbs     OT LONG TERM GOAL #6   Title Pt to perform simple meal prep and light cleaning tasks mod I level    Time 12    Period Weeks    Status New      OT LONG TERM GOAL #7   Title Improve functional use RUE as evidenced by performing 25 blocks on Box & Blocks and performing 9 hole peg test in 90 sec or under    Time 12    Period Weeks    Status On-going   12 blocks     OT LONG TERM GOAL #8   Title Pt to returned to modified art/sculpting tasks with task modifications and A/E prn    Time 12    Period Weeks    Status New                 Plan - 05/09/20 1220    Clinical Impression Statement Pt using Rt hand better for simulation eating w/ repetition and cueing. Pt continues to have problems with Rt foot supination w/ transfers    Occupational performance deficits (Please refer to evaluation for details): ADL's;IADL's;Work;Leisure;Social Participation    Body Structure / Function / Physical Skills ADL;ROM;Dexterity;Balance;IADL;Body mechanics;Improper spinal/pelvic alignment;Sensation;Mobility;Flexibility;Strength;Coordination;FMC;Tone;UE functional use;Decreased knowledge of use of DME;Proprioception    Rehab Potential Good    OT Frequency 2x / week    OT Duration 12 weeks    OT Treatment/Interventions Self-care/ADL training;Therapeutic exercise;Functional Mobility Training;Neuromuscular education;Manual Therapy;Splinting;Aquatic Therapy;Manual lymph drainage;Therapeutic activities;Coping strategies training;DME and/or AE instruction;Electrical Stimulation;Fluidtherapy;Passive range of motion;Patient/family education    Plan continue functional use RUE (basic coordination and reaching tasks), NMR, sit to stand with  focus on controlling ankle stability    Consulted and Agree with Plan of Care Patient           Patient will benefit from skilled therapeutic intervention in order to improve the following deficits and impairments:   Body Structure / Function / Physical Skills: ADL, ROM, Dexterity, Balance, IADL, Body mechanics, Improper spinal/pelvic alignment, Sensation, Mobility, Flexibility, Strength, Coordination, FMC, Tone, UE functional use, Decreased knowledge of use of DME, Proprioception       Visit Diagnosis: Hemiplegia and hemiparesis following cerebral infarction affecting right dominant side (HCC)  Other lack of coordination  Unsteadiness on feet    Problem List Patient Active Problem List   Diagnosis Date Noted  . Abnormality of gait 04/28/2020  . Neurogenic bladder 03/17/2020  . Sepsis due to pneumonia ([place]) 03/06/2020  . History of stroke with residual deficit 03/06/2020  . Transient hypotension 03/06/2020  . Spastic hemiplegia affecting nondominant side ([place])   . History of hypertension   . Expressive aphasia   . AKI (acute kidney injury) ([place])   . Global aphasia   . Hypoalbuminemia due to protein-calorie malnutrition ([place])   . Dysphagia, post-stroke   . Hyperlipidemia 02/02/2020  . Dysphagia following cerebral infarction 02/02/2020  . Aspiration pneumonia ([place]) 02/02/2020  . Acute ischemic left MCA  stroke ([place]) 02/02/2020  . Acute respiratory failure ([place])   . Acute ischemic stroke [facility]) s/p clot retrieval L MCA & ACA A3 01/27/2020  . Aspiration into airway 01/27/2020  . Chronic anticoagulation 01/27/2020  . Paroxysmal atrial fibrillation ([place]) 06/15/2018  . Essential hypertension 06/15/2018  . NICM (nonischemic cardiomyopathy) ([place]) 06/15/2018  . Visit for monitoring Tikosyn therapy 06/12/2018    Carey Bullocks, OTR/L 05/09/2020, 12:22 PM  [facility] [address] [place], [place],  [address] Phone: [phone]   Fax:  [phone]  Name: Dustin Schneider MRN: [id number] Date of Birth: May 30, 1956

## 2020-05-09 NOTE — Therapy (Signed)
[facility] [address] [place], [place], [address] Phone: [phone]   Fax:  [phone]  Physical Therapy Treatment  Patient Details  Name: Dustin Schneider MRN: [id number] Date of Birth: Feb 10, 1956 Referring Provider (PT): Lauraine Rinne, PA-C (will be followed by Dr. Posey Pronto)   Encounter Date: 05/09/2020   PT End of Session - 05/09/20 1108    Visit Number 17    Number of Visits 25    Date for PT Re-Evaluation 06/14/20    Authorization Type BCBS    PT Start Time 1106    PT Stop Time 1146    PT Time Calculation (min) 40 min    Equipment Utilized During Treatment Gait belt    Activity Tolerance Patient tolerated treatment well    Behavior During Therapy [facility] for tasks assessed/performed           Past Medical History:  Diagnosis Date   Hypertension    NICM (nonischemic cardiomyopathy) ([place]) 06/15/2018   04/23/18: EF 45%, 09/15/18: NOrmal LVEF   Paroxysmal atrial fibrillation ([place]) 06/15/2018   Stroke ([place])    Visit for monitoring Tikosyn therapy 06/12/2018    Past Surgical History:  Procedure Laterality Date   BUBBLE STUDY  03/11/2020   Procedure: BUBBLE STUDY;  Surgeon: [name], MD;  Location: [place];  Service: Cardiovascular;;   CARDIOVERSION N/A 05/13/2018   Procedure: CARDIOVERSION;  Surgeon: [name], MD;  Location: [place];  Service: Cardiovascular;  Laterality: N/A;   CARDIOVERSION N/A 06/13/2018   Procedure: CARDIOVERSION;  Surgeon: [name], MD;  Location: [place];  Service: Cardiovascular;  Laterality: N/A;   CARDIOVERSION N/A 06/23/2018   Procedure: CARDIOVERSION;  Surgeon: [name], MD;  Location: [place];  Service: Cardiovascular;  Laterality: N/A;   CARDIOVERSION N/A 03/11/2020   Procedure: CARDIOVERSION;  Surgeon: [name], MD;  Location: [place];  Service: Cardiovascular;  Laterality: N/A;   excision of actinic keratosis     EYE SURGERY      eye lift   IR CT HEAD LTD  01/27/2020   IR PERCUTANEOUS ART THROMBECTOMY/INFUSION INTRACRANIAL INC DIAG ANGIO  01/27/2020       IR PERCUTANEOUS ART THROMBECTOMY/INFUSION INTRACRANIAL INC DIAG ANGIO  01/27/2020   RADIOLOGY WITH ANESTHESIA N/A 01/27/2020   Procedure: IR WITH ANESTHESIA;  Surgeon: Radiologist, Medication, MD;  Location: [facility];  Service: Radiology;  Laterality: N/A;   TEE WITHOUT CARDIOVERSION N/A 03/11/2020   Procedure: TRANSESOPHAGEAL ECHOCARDIOGRAM (TEE);  Surgeon: [name], MD;  Location: [facility];  Service: Cardiovascular;  Laterality: N/A;   TONSILLECTOMY     WISDOM TOOTH EXTRACTION      There were no vitals filed for this visit.   Subjective Assessment - 05/09/20 1108    Subjective Pt reports he is doing well. Worked hard in OT.    Patient is accompained by: Family member    Pertinent History PMH: HTN, paroxysmal a fib    Limitations Standing;Walking    Patient Stated Goals "wants everything working on R side again"                             [facility] Adult PT Treatment/Exercise - 05/09/20 1142      Transfers   Transfers Sit to Stand;Stand to Sit;Squat Pivot Transfers    Sit to Stand 4: Min guard    Stand to Sit 4: Min guard    Squat Pivot Transfers 4: Min guard  Squat Pivot Transfer Details (indicate cue type and reason) mat to w/c to the left      Ambulation/Gait   Ambulation/Gait Yes    Ambulation/Gait Assistance 4: Min assist    Ambulation/Gait Assistance Details t strap on right AFO. PT assisted with right foot placement at times as well as tactile cues to tighten right quad. Pt does have slight hyperextension on  right.    Ambulation Distance (Feet) 115 Feet    Assistive device Rolling walker   right AFO   Gait Pattern Step-through pattern;Decreased step length - right;Decreased step length - left;Decreased stance time - right;Decreased dorsiflexion - right;Decreased hip/knee flexion - right;Right genu recurvatum     Ambulation Surface Level;Indoor      Neuro Re-ed    Neuro Re-ed Details  Standing at walker: stepping forward and back with LLE x 10 with trying to facilitate more RLE weight shift then stepping forward and back x 10 with min assist for placement and verbal cues to pick up foot versus slide when bringing back. Standing with 2" step under left foot working in increasing RLE weight shift with PT blocking right knee for safety with raising LUE overhead x 10, then sit to stand x 5 from mat with 2" step under left foot with min assist to facilitate RLE weight shift.      Exercises   Exercises Other Exercises    Other Exercises  Seated hip adduction squeezing ball x 10 with 5 sec holds. Bridges x 10 with PT stabilizing minimally to keep right hip in neutral, hooklying right hip abd/adduction to targets x 10 with cues to go slow and controlled. Supine right SAQ over bolster x 10 with 3 sec holds.                  PT Education - 05/09/20 1219    Education Details Added hip adduction to HEP    Person(s) Educated Patient;Child(ren)    Methods Explanation;Demonstration;Handout    Comprehension Verbalized understanding            PT Short Term Goals - 04/20/20 1217      PT SHORT TERM GOAL #1   Title Pt will perform sit <> stand with RW with supervision in order to demo decr caregiver burden and improved functional BLE strength/balance. ALL STGS DUE 05/18/20    Baseline currently min guard    Time 4    Period Weeks    Status New    Target Date 05/18/20      PT SHORT TERM GOAL #2   Title Pt will perform sit > supine with supervision and supine > sit with min guard for bed mobility in order to decr caregiver burden.    Baseline 04/15/20 supervision sit to supine, min assist supine to sit as did not get right leg off mat    Time 4    Period Weeks    Status On-going      PT SHORT TERM GOAL #3   Title Pt will undergo assessment of AFO for functional transfers and gait.    Baseline  Orthotist came last sesssion but did not feel pt tone stable enough to pursue brace yet. Will continue to assess need.    Time 4    Period Weeks    Status On-going      PT SHORT TERM GOAL #4   Title Pt will ambulate 115 with RW with mod A x1 in order to improve functional mobility.    Baseline  04/15/20 Mod assist with 2nd person CGA 230' with RW. Ability does vary    Time 4    Period Weeks    Status Revised      PT SHORT TERM GOAL #6   Title Pt will perform squat pivot transfers from w/c <> mat table with supervision.    Baseline CGA w/c to/from mat 04/15/20    Time 4    Period Weeks    Status Revised             PT Dustin Term Goals - 03/16/20 1139      PT Dustin TERM GOAL #1   Title Pt and pt's family members will be independent with final HEP in order to build upon functional gains made in therapy. ALL LTGS DUE 06/08/20    Time 12    Period Weeks    Status New    Target Date 06/08/20      PT Dustin TERM GOAL #2   Title Pt will ambulate 115' with min guard/min A with RW and R AFO in order to improve household mobility.    Time 12    Period Weeks    Status New      PT Dustin TERM GOAL #3   Title Pt will perform sit <> stand with RW with min guard in order to improve functional BLE strength for transfers.    Time 12    Period Weeks    Status New      PT Dustin TERM GOAL #4   Title Pt will perform squat pivot transfer with mod I vs. stand pivot transfer with RW with min guard in order to decr caregiver burden.    Time 12    Period Weeks    Status New                 Plan - 05/09/20 1219    Clinical Impression Statement PT continued to focus on strengthening of RLE with increasing WB. Pt does fatigue quicker with gait when PT assists less. Continues to have some ER with swing phase.    Personal Factors and Comorbidities Comorbidity 3+    Comorbidities dense L MCA CVA, paroxysmal A. fib on chronic Xarelto, nonischemic cardiomyopathy and hypertension.     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, works as an Electrical engineer Evolving/Moderate complexity    Rehab Potential Good    PT Frequency 2x / week    PT Duration 12 weeks    PT Treatment/Interventions ADLs/Self Care Home Management;Aquatic Therapy;DME Instruction;Gait training;Stair training;Functional mobility training;Therapeutic activities;Therapeutic exercise;Electrical Stimulation;Balance training;Neuromuscular re-education;Wheelchair mobility training;Orthotic Fit/Training;Patient/family education;Passive range of motion;Energy conservation    PT Next Visit Plan i contacted chris to see when he can come back - waiting to hear back. hip ABD/quad strengthening, weight shifting/NMR to RLE. continue gait training with brace, t-strap and hand orthotic, bioness to RLE (ant tib and quad) for strengthening/muscle re-ed    Consulted and Agree with Plan of Care Patient;Family member/caregiver           Patient will benefit from skilled therapeutic intervention in order to improve the following deficits and impairments:  Abnormal gait, Decreased activity tolerance, Decreased balance, Decreased cognition, Decreased mobility, Decreased coordination, Decreased range of motion, Decreased endurance, Decreased strength, Hypomobility, Difficulty walking, Impaired UE functional use, Impaired vision/preception, Postural dysfunction, Impaired sensation  Visit Diagnosis: Other abnormalities of gait and mobility  Muscle  weakness (generalized)     Problem List Patient Active Problem List   Diagnosis Date Noted   Abnormality of gait 04/28/2020   Neurogenic bladder 03/17/2020   Sepsis due to pneumonia ([place]) 03/06/2020   History of stroke with residual deficit 03/06/2020   Transient  hypotension 03/06/2020   Spastic hemiplegia affecting nondominant side (HCC)    History of hypertension    Expressive aphasia    AKI (acute kidney injury) ([place])    Global aphasia    Hypoalbuminemia due to protein-calorie malnutrition ([place])    Dysphagia, post-stroke    Hyperlipidemia 02/02/2020   Dysphagia following cerebral infarction 02/02/2020   Aspiration pneumonia ([place]) 02/02/2020   Acute ischemic left MCA stroke ([place]) 02/02/2020   Acute respiratory failure ([place])    Acute ischemic stroke ([place]) s/p clot retrieval L MCA & ACA A3 01/27/2020   Aspiration into airway 01/27/2020   Chronic anticoagulation 01/27/2020   Paroxysmal atrial fibrillation ([place]) 06/15/2018   Essential hypertension 06/15/2018   NICM (nonischemic cardiomyopathy) ([name]) 06/15/2018   Visit for monitoring Tikosyn therapy 06/12/2018    Electa Sniff, PT, DPT, NCS 05/09/2020, 12:21 PM  [facility] [address] [place], [place], [address] Phone: [phone]   Fax:  [phone]  Name: EVAN MACKIE MRN: [id number] Date of Birth: 1956/06/02

## 2020-05-12 ENCOUNTER — Ambulatory Visit: Payer: BC Managed Care – PPO

## 2020-05-12 ENCOUNTER — Ambulatory Visit: Payer: BC Managed Care – PPO | Admitting: Occupational Therapy

## 2020-05-12 ENCOUNTER — Ambulatory Visit: Payer: BC Managed Care – PPO | Admitting: Physical Therapy

## 2020-05-12 ENCOUNTER — Other Ambulatory Visit: Payer: Self-pay

## 2020-05-12 DIAGNOSIS — I69351 Hemiplegia and hemiparesis following cerebral infarction affecting right dominant side: Secondary | ICD-10-CM | POA: Diagnosis not present

## 2020-05-12 DIAGNOSIS — R278 Other lack of coordination: Secondary | ICD-10-CM

## 2020-05-12 DIAGNOSIS — M6281 Muscle weakness (generalized): Secondary | ICD-10-CM

## 2020-05-12 DIAGNOSIS — R482 Apraxia: Secondary | ICD-10-CM

## 2020-05-12 DIAGNOSIS — R2681 Unsteadiness on feet: Secondary | ICD-10-CM

## 2020-05-12 DIAGNOSIS — R2689 Other abnormalities of gait and mobility: Secondary | ICD-10-CM

## 2020-05-12 DIAGNOSIS — R4701 Aphasia: Secondary | ICD-10-CM

## 2020-05-12 NOTE — Therapy (Signed)
[facility] [address] [place], [place], [address] Phone: [phone]   Fax:  [phone]  Occupational Therapy Treatment  Patient Details  Name: Dustin Schneider MRN: [id number] Date of Birth: [birth year] No data recorded  Encounter Date: 05/12/2020   OT End of Session - 05/12/20 1024    Visit Number 15    Number of Visits 25    Date for OT Re-Evaluation 06/23/20    Authorization Type BC/BS - No auth required, covered 100%    OT Start Time 1020    OT Stop Time 1100    OT Time Calculation (min) 40 min    Activity Tolerance Patient tolerated treatment well    Behavior During Therapy [facility] for tasks assessed/performed           Past Medical History:  Diagnosis Date  . Hypertension   . NICM (nonischemic cardiomyopathy) ([place]) 06/15/2018   04/23/18: EF 45%, 09/15/18: NOrmal LVEF  . Paroxysmal atrial fibrillation ([place]) 06/15/2018  . Stroke ([place])   . Visit for monitoring Tikosyn therapy 06/12/2018    Past Surgical History:  Procedure Laterality Date  . BUBBLE STUDY  03/11/2020   Procedure: BUBBLE STUDY;  Surgeon: [name], MD;  Location: [place];  Service: Cardiovascular;;  . CARDIOVERSION N/A 05/13/2018   Procedure: CARDIOVERSION;  Surgeon: [name], MD;  Location[facility];  Service: Cardiovascular;  Laterality: N/A;  . CARDIOVERSION N/A 06/13/2018   Procedure: CARDIOVERSION;  Surgeon: [name], MD;  Location[facility];  Service: Cardiovascular;  Laterality: N/A;  . CARDIOVERSION N/A 06/23/2018   Procedure: CARDIOVERSION;  Surgeon: [name], MD;  Location: [facility];  Service: Cardiovascular;  Laterality: N/A;  . CARDIOVERSION N/A 03/11/2020   Procedure: CARDIOVERSION;  Surgeon: [name], MD;  Location: [place];  Service: Cardiovascular;  Laterality: N/A;  . excision of actinic keratosis    . EYE SURGERY     eye lift  . IR CT HEAD LTD  01/27/2020  . IR PERCUTANEOUS ART  THROMBECTOMY/INFUSION INTRACRANIAL INC DIAG ANGIO  01/27/2020      . IR PERCUTANEOUS ART THROMBECTOMY/INFUSION INTRACRANIAL INC DIAG ANGIO  01/27/2020  . RADIOLOGY WITH ANESTHESIA N/A 01/27/2020   Procedure: IR WITH ANESTHESIA;  Surgeon: Radiologist, Medication, MD;  Location: [facility];  Service: Radiology;  Laterality: N/A;  . TEE WITHOUT CARDIOVERSION N/A 03/11/2020   Procedure: TRANSESOPHAGEAL ECHOCARDIOGRAM (TEE);  Surgeon: [name], MD;  Location: [facility];  Service: Cardiovascular;  Laterality: N/A;  . TONSILLECTOMY    . WISDOM TOOTH EXTRACTION      There were no vitals filed for this visit.   Subjective Assessment - 05/12/20 1021    Subjective  "No pain and no falls"    Patient is accompanied by: Family member    Pertinent History Lt MCA CVA 01/27/20, readmitted to hospital with HAP and fever on 03/06/20 and released 03/11/20. PMH: paroxsymal A-fib, HTN    Limitations fall    Currently in Pain? No/denies                        OT Treatments/Exercises (OP) - 05/12/20 0001      ADLs   ADL Comments Practiced folding pillow case bilateral UE's  w/ max difficulty but able. Unable to hook large buttons      Exercises   Exercises Hand      Functional Reaching Activities   Low Level Pt placing checkers into Connect 4 slots RUE for low  to mid level reaching and coordination w/ increasing difficulty w/ fatigue    Mid Level Bilateral sh flexion to eye level holding thick foam noodle for proper reaching pattern      Fine Motor Coordination (Hand/Wrist)   Fine Motor Coordination Large Pegboard    Large Pegboard Pt placing large pegs in pegboard Rt hand with assist from Lt hand to turn peg around for placement. Pt completed 2 rows with extra time                    OT Short Term Goals - 05/05/20 1125      OT SHORT TERM GOAL #1   Title Independent with initial HEP    Time 4    Period Weeks    Status Achieved      OT SHORT TERM GOAL #2   Title Pt to perform  low to mid level reaching of light objects RUE in prep for feeding self and bathing    Time 4    Period Weeks    Status Achieved   04/20/20     OT SHORT TERM GOAL #3   Title Pt to feed self 50% of the time with Rt dominant hand and A/E prn    Time 4    Period Weeks    Status On-going   04/20/20: able to eat finger foods     OT SHORT TERM GOAL #4   Title Pt to perform UE dressing of pullover shirt mod I level    Time 4    Period Weeks    Status Achieved   met per pt and brother     OT SHORT TERM GOAL #5   Title Pt to perform LE dressing and all bathing with no more than mod assist using A/E and strategies prn    Time 4    Period Weeks    Status Achieved   04/20/20:  set-up for bedside bath, mod A for LE dressing     OT SHORT TERM GOAL #6   Title Pt to perform toileting and toilet transfers with no more than min assist overall    Time 4    Period Weeks    Status On-going      OT SHORT TERM GOAL #7   Title Improve functional use of RUE as evidenced by performing 8 blocks on Box & Blocks test    Baseline 0    Time 4    Period Weeks    Status Achieved   04/20/20:  6 blocks, 05/05/20: 12 blocks            OT Long Term Goals - 05/05/20 1126      OT LONG TERM GOAL #1   Title Pt to be independent with updated HEP    Time 12    Period Weeks    Status New      OT LONG TERM GOAL #2   Title Pt to perform all BADLS at mod I level    Time 12    Period Weeks    Status On-going      OT LONG TERM GOAL #3   Title Pt to feed self 75% or greater with Rt dominant hand and grooming 50% or more with Rt hand    Time 12    Period Weeks    Status New      OT LONG TERM GOAL #4   Title Pt to retrieve light weight objects from high shelf RUE consistently  Time 12    Period Weeks    Status New      OT LONG TERM GOAL #5   Title Pt to demo 25 lbs grip strength or more Rt dominant hand for gripping, opening jars/containers    Time 12    Period Weeks    Status On-going   12.5 lbs      OT LONG TERM GOAL #6   Title Pt to perform simple meal prep and light cleaning tasks mod I level    Time 12    Period Weeks    Status New      OT LONG TERM GOAL #7   Title Improve functional use RUE as evidenced by performing 25 blocks on Box & Blocks and performing 9 hole peg test in 90 sec or under    Time 12    Period Weeks    Status On-going   12 blocks     OT LONG TERM GOAL #8   Title Pt to returned to modified art/sculpting tasks with task modifications and A/E prn    Time 12    Period Weeks    Status New                 Plan - 05/12/20 1402    Clinical Impression Statement Pt continues to demo slow progress with RUE function.    Occupational performance deficits (Please refer to evaluation for details): ADL's;IADL's;Work;Leisure;Social Participation    Body Structure / Function / Physical Skills ADL;ROM;Dexterity;Balance;IADL;Body mechanics;Improper spinal/pelvic alignment;Sensation;Mobility;Flexibility;Strength;Coordination;FMC;Tone;UE functional use;Decreased knowledge of use of DME;Proprioception    Rehab Potential Good    OT Frequency 2x / week    OT Duration 12 weeks    OT Treatment/Interventions Self-care/ADL training;Therapeutic exercise;Functional Mobility Training;Neuromuscular education;Manual Therapy;Splinting;Aquatic Therapy;Manual lymph drainage;Therapeutic activities;Coping strategies training;DME and/or AE instruction;Electrical Stimulation;Fluidtherapy;Passive range of motion;Patient/family education    Plan continue functional use RUE (basic coordination and reaching tasks), NMR, sit to stand with focus on controlling ankle stability    Consulted and Agree with Plan of Care Patient           Patient will benefit from skilled therapeutic intervention in order to improve the following deficits and impairments:   Body Structure / Function / Physical Skills: ADL, ROM, Dexterity, Balance, IADL, Body mechanics, Improper spinal/pelvic alignment, Sensation,  Mobility, Flexibility, Strength, Coordination, FMC, Tone, UE functional use, Decreased knowledge of use of DME, Proprioception       Visit Diagnosis: Hemiplegia and hemiparesis following cerebral infarction affecting right dominant side (HCC)  Other lack of coordination  Muscle weakness (generalized)    Problem List Patient Active Problem List   Diagnosis Date Noted  . Abnormality of gait 04/28/2020  . Neurogenic bladder 03/17/2020  . Sepsis due to pneumonia ([place]) 03/06/2020  . History of stroke with residual deficit 03/06/2020  . Transient hypotension 03/06/2020  . Spastic hemiplegia affecting nondominant side ([place])   . History of hypertension   . Expressive aphasia   . AKI (acute kidney injury) ([place])   . Global aphasia   . Hypoalbuminemia due to protein-calorie malnutrition ([place])   . Dysphagia, post-stroke   . Hyperlipidemia 02/02/2020  . Dysphagia following cerebral infarction 02/02/2020  . Aspiration pneumonia ([place]) 02/02/2020  . Acute ischemic left MCA stroke ([place]) 02/02/2020  . Acute respiratory failure ([place])   . Acute ischemic stroke [facility]) s/p clot retrieval L MCA & ACA A3 01/27/2020  . Aspiration into airway 01/27/2020  . Chronic anticoagulation 01/27/2020  . Paroxysmal atrial fibrillation ([place]) 06/15/2018  .  Essential hypertension 06/15/2018  . NICM (nonischemic cardiomyopathy) ([place]) 06/15/2018  . Visit for monitoring Tikosyn therapy 06/12/2018    Carey Bullocks, OTR/L 05/12/2020, 2:03 PM  [facility] [address] [place], [place], [address] Phone: [phone]   Fax:  [phone]  Name: Dustin Schneider MRN: [id number] Date of Birth: June 07, 1956

## 2020-05-12 NOTE — Therapy (Addendum)
[facility] [address] [place], [place], [address] Phone: [phone]   Fax:  [phone]  Physical Therapy Treatment  Patient Details  Name: Dustin Schneider MRN: [id number] Date of Birth: 11/25/1955 Referring Provider (PT): Lauraine Rinne, PA-C (will be followed by Dr. Posey Pronto)   Encounter Date: 05/12/2020   PT End of Session - 05/12/20 1703    Visit Number 18    Number of Visits 25    Date for PT Re-Evaluation 06/14/20    Authorization Type BCBS    PT Start Time 1535    PT Stop Time 1615    PT Time Calculation (min) 40 min    Equipment Utilized During Treatment Gait belt    Activity Tolerance Patient tolerated treatment well    Behavior During Therapy [facility] for tasks assessed/performed           Past Medical History:  Diagnosis Date  . Hypertension   . NICM (nonischemic cardiomyopathy) ([place]) 06/15/2018   04/23/18: EF 45%, 09/15/18: NOrmal LVEF  . Paroxysmal atrial fibrillation ([place]) 06/15/2018  . Stroke ([place])   . Visit for monitoring Tikosyn therapy 06/12/2018    Past Surgical History:  Procedure Laterality Date  . BUBBLE STUDY  03/11/2020   Procedure: BUBBLE STUDY;  Surgeon: [name], MD;  Location: [place];  Service: Cardiovascular;;  . CARDIOVERSION N/A 05/13/2018   Procedure: CARDIOVERSION;  Surgeon: [name], MD;  Location: [facility] ENDOSCOPY;  Service: Cardiovascular;  Laterality: N/A;  . CARDIOVERSION N/A 06/13/2018   Procedure: CARDIOVERSION;  Surgeon: [name], MD;  Location: [facility] ENDOSCOPY;  Service: Cardiovascular;  Laterality: N/A;  . CARDIOVERSION N/A 06/23/2018   Procedure: CARDIOVERSION;  Surgeon: [name], MD;  Location: [facility] ENDOSCOPY;  Service: Cardiovascular;  Laterality: N/A;  . CARDIOVERSION N/A 03/11/2020   Procedure: CARDIOVERSION;  Surgeon: [name], MD;  Location: [place];  Service: Cardiovascular;  Laterality: N/A;  . excision of actinic keratosis    . EYE SURGERY      eye lift  . IR CT HEAD LTD  01/27/2020  . IR PERCUTANEOUS ART THROMBECTOMY/INFUSION INTRACRANIAL INC DIAG ANGIO  01/27/2020      . IR PERCUTANEOUS ART THROMBECTOMY/INFUSION INTRACRANIAL INC DIAG ANGIO  01/27/2020  . RADIOLOGY WITH ANESTHESIA N/A 01/27/2020   Procedure: IR WITH ANESTHESIA;  Surgeon: Radiologist, Medication, MD;  Location: [place];  Service: Radiology;  Laterality: N/A;  . TEE WITHOUT CARDIOVERSION N/A 03/11/2020   Procedure: TRANSESOPHAGEAL ECHOCARDIOGRAM (TEE);  Surgeon: [name], MD;  Location: [place];  Service: Cardiovascular;  Laterality: N/A;  . TONSILLECTOMY    . WISDOM TOOTH EXTRACTION      There were no vitals filed for this visit.   Subjective Assessment - 05/12/20 1536    Subjective Nothing new - same old stuff.    Patient is accompained by: Family member    Pertinent History PMH: HTN, paroxysmal a fib    Limitations Standing;Walking    Patient Stated Goals "wants everything working on R side again"    Currently in Pain? No/denies                             [facility] Adult PT Treatment/Exercise - 05/12/20 0001      Transfers   Transfers Sit to Stand;Stand to Sit;Squat Pivot Transfers    Sit to Stand 4: Min guard    Stand to Sit 4: Min guard    Stand to Sit Details reminder  cues to remove R hand from hand orthosis prior to sitting    Squat Pivot Transfers 4: Min guard    Squat Pivot Transfer Details (indicate cue type and reason) mat <> w/c to the L      Ambulation/Gait   Ambulation/Gait Yes    Ambulation/Gait Assistance 4: Min assist    Ambulation/Gait Assistance Details performed 1 lap of gait with anterior R ottobock and t strap, pt demonstrating R hyperextension at times, with incr gait distance, pt needing less assist to place RLE (initially has tendency to step too adducted). 2nd lap performed gait with pt just wearing air cast on RLE for orthotist to better assess what is going on at the knee - no knee buckling occurred during  gait, just hyperextension. needing intermittent tactile and verbal cues throughout to activate R quad during stance    Ambulation Distance (Feet) 115 Feet   x2   Assistive device Rolling walker   R AFO   Gait Pattern Step-through pattern;Decreased step length - right;Decreased step length - left;Decreased stance time - right;Decreased dorsiflexion - right;Decreased hip/knee flexion - right;Right genu recurvatum    Ambulation Surface Level;Indoor    Gait Comments [name], orthotist, present during today's session to assess pt's gait to determine the best AFO for pt. Pt demonstrating in incr PF moment and hyperextension of RLE with use of R anterior ottobock AFO (and occassional almost vaulting), orthotist stating over time that if pt had this brace he would end up cracking it due to incr hyperextension (howver did advise for pt to continue to use in clinic), instead is recommending a custom posterior AFO that will also have some short of ankle strap that wlill provide ankle stability and will be easier to don than a T strap. all pt and pt's gf questions answered by orthotist. [facility] will call pt's family for pt to be scheduled to come in and be casted       Neuro Re-ed    Neuro Re-ed Details  standing at RW: with 2" block under LLE to facilitate incr weight bearing/shifting to RLE, performed x10 reps mini squats with 4-5 second holds before standing, tactile cues for quad activation and posture in standing                    PT Short Term Goals - 04/20/20 1217      PT SHORT TERM GOAL #1   Title Pt will perform sit <> stand with RW with supervision in order to demo decr caregiver burden and improved functional BLE strength/balance. ALL STGS DUE 05/18/20    Baseline currently min guard    Time 4    Period Weeks    Status New    Target Date 05/18/20      PT SHORT TERM GOAL #2   Title Pt will perform sit > supine with supervision and supine > sit with min guard for bed mobility in  order to decr caregiver burden.    Baseline 04/15/20 supervision sit to supine, min assist supine to sit as did not get right leg off mat    Time 4    Period Weeks    Status On-going      PT SHORT TERM GOAL #3   Title Pt will undergo assessment of AFO for functional transfers and gait.    Baseline Orthotist came last sesssion but did not feel pt tone stable enough to pursue brace yet. Will continue to assess need.  Time 4    Period Weeks    Status On-going      PT SHORT TERM GOAL #4   Title Pt will ambulate 34' with RW with mod A x1 in order to improve functional mobility.    Baseline 04/15/20 Mod assist with 2nd person CGA 230' with RW. Ability does vary    Time 4    Period Weeks    Status Revised      PT SHORT TERM GOAL #6   Title Pt will perform squat pivot transfers from w/c <> mat table with supervision.    Baseline CGA w/c to/from mat 04/15/20    Time 4    Period Weeks    Status Revised             PT Long Term Goals - 03/16/20 1139      PT LONG TERM GOAL #1   Title Pt and pt's family members will be independent with final HEP in order to build upon functional gains made in therapy. ALL LTGS DUE 06/08/20    Time 12    Period Weeks    Status New    Target Date 06/08/20      PT LONG TERM GOAL #2   Title Pt will ambulate 115' with min guard/min A with RW and R AFO in order to improve household mobility.    Time 12    Period Weeks    Status New      PT LONG TERM GOAL #3   Title Pt will perform sit <> stand with RW with min guard in order to improve functional BLE strength for transfers.    Time 12    Period Weeks    Status New      PT LONG TERM GOAL #4   Title Pt will perform squat pivot transfer with mod I vs. stand pivot transfer with RW with min guard in order to decr caregiver burden.    Time 12    Period Weeks    Status New                 Plan - 05/12/20 1837    Clinical Impression Statement Orthotist present during today's session who  recommends a custom posterior AFO (to assist with PF moment and hyperextension) that will also assist with ankle stability. In the meantime, orthotist recommends continuing to use anterior ottobock AFO with gait. PT providing less assist with RLE foot placement today. Pt is progressing well, will continue to progress towards LTGs.    Personal Factors and Comorbidities Comorbidity 3+    Comorbidities dense L MCA CVA, paroxysmal A. fib on chronic Xarelto, nonischemic cardiomyopathy and hypertension.    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, works as an Electrical engineer Evolving/Moderate complexity    Rehab Potential Good    PT Frequency 2x / week    PT Duration 12 weeks    PT Treatment/Interventions ADLs/Self Care Home Management;Aquatic Therapy;DME Instruction;Gait training;Stair training;Functional mobility training;Therapeutic activities;Therapeutic exercise;Electrical Stimulation;Balance training;Neuromuscular re-education;Wheelchair mobility training;Orthotic Fit/Training;Patient/family education;Passive range of motion;Energy conservation    PT Next Visit Plan STGs due 10/6.hip ABD/quad strengthening, weight shifting/NMR to RLE. continue gait training with brace, t-strap and hand orthotic, bioness to RLE (ant tib and quad) for strengthening/muscle re-ed    Consulted and Agree with Plan of Care Patient;Family member/caregiver           Patient will  benefit from skilled therapeutic intervention in order to improve the following deficits and impairments:  Abnormal gait, Decreased activity tolerance, Decreased balance, Decreased cognition, Decreased mobility, Decreased coordination, Decreased range of motion, Decreased endurance, Decreased strength, Hypomobility, Difficulty walking,  Impaired UE functional use, Impaired vision/preception, Postural dysfunction, Impaired sensation  Visit Diagnosis: Other lack of coordination  Hemiplegia and hemiparesis following cerebral infarction affecting right dominant side (HCC)  Unsteadiness on feet  Other abnormalities of gait and mobility  Muscle weakness (generalized)     Problem List Patient Active Problem List   Diagnosis Date Noted  . Abnormality of gait 04/28/2020  . Neurogenic bladder 03/17/2020  . Sepsis due to pneumonia ([place]) 03/06/2020  . History of stroke with residual deficit 03/06/2020  . Transient hypotension 03/06/2020  . Spastic hemiplegia affecting nondominant side ([employer])   . History of hypertension   . Expressive aphasia   . AKI (acute kidney injury) ([place])   . Global aphasia   . Hypoalbuminemia due to protein-calorie malnutrition ([place])   . Dysphagia, post-stroke   . Hyperlipidemia 02/02/2020  . Dysphagia following cerebral infarction 02/02/2020  . Aspiration pneumonia ([place]) 02/02/2020  . Acute ischemic left MCA stroke ([place]) 02/02/2020  . Acute respiratory failure ([place])   . Acute ischemic stroke [facility]) s/p clot retrieval L MCA & ACA A3 01/27/2020  . Aspiration into airway 01/27/2020  . Chronic anticoagulation 01/27/2020  . Paroxysmal atrial fibrillation ([place]) 06/15/2018  . Essential hypertension 06/15/2018  . NICM (nonischemic cardiomyopathy) ([place]) 06/15/2018  . Visit for monitoring Tikosyn therapy 06/12/2018    Arliss Journey , PT, DPT  05/12/2020, 6:49 PM  [facility] [address] [place], [place], [address] Phone: [phone]   Fax:  [phone]  Name: GARDY MONTANARI MRN: [id number] Date of Birth: 1956/04/09

## 2020-05-13 ENCOUNTER — Telehealth: Payer: Self-pay | Admitting: *Deleted

## 2020-05-13 ENCOUNTER — Telehealth: Payer: Self-pay | Admitting: Physical Therapy

## 2020-05-13 MED ORDER — BACLOFEN 10 MG PO TABS: 10.0000 mg | ORAL_TABLET | Freq: Three times a day (TID) | ORAL | 1 refills | Status: DC

## 2020-05-13 NOTE — Therapy (Signed)
[facility] [address] [place], [place], [address] Phone: [phone]   Fax:  [phone]  Speech Language Pathology Treatment  Patient Details  Name: Dustin Schneider MRN: [id number] Date of Birth: 09-Mar-1956 Referring Provider (SLP): Dr. Delice Lesch   Encounter Date: 05/12/2020   End of Session - 05/13/20 0044    Visit Number 15    Number of Visits 25    Date for SLP Re-Evaluation 06/15/20    SLP Start Time 1106    SLP Stop Time  1146    SLP Time Calculation (min) 40 min    Activity Tolerance Patient tolerated treatment well           Past Medical History:  Diagnosis Date   Hypertension    NICM (nonischemic cardiomyopathy) ([place]) 06/15/2018   04/23/18: EF 45%, 09/15/18: NOrmal LVEF   Paroxysmal atrial fibrillation ([place]) 06/15/2018   Stroke ([place])    Visit for monitoring Tikosyn therapy 06/12/2018    Past Surgical History:  Procedure Laterality Date   BUBBLE STUDY  03/11/2020   Procedure: BUBBLE STUDY;  Surgeon: [name], MD;  Location: [place];  Service: Cardiovascular;;   CARDIOVERSION N/A 05/13/2018   Procedure: CARDIOVERSION;  Surgeon: [name], MD;  Location: [place];  Service: Cardiovascular;  Laterality: N/A;   CARDIOVERSION N/A 06/13/2018   Procedure: CARDIOVERSION;  Surgeon: [name], MD;  Location: [facility] ENDOSCOPY;  Service: Cardiovascular;  Laterality: N/A;   CARDIOVERSION N/A 06/23/2018   Procedure: CARDIOVERSION;  Surgeon: [name], MD;  Location: [place];  Service: Cardiovascular;  Laterality: N/A;   CARDIOVERSION N/A 03/11/2020   Procedure: CARDIOVERSION;  Surgeon: [name], MD;  Location: [place];  Service: Cardiovascular;  Laterality: N/A;   excision of actinic keratosis     EYE SURGERY     eye lift   IR CT HEAD LTD  01/27/2020   IR PERCUTANEOUS ART THROMBECTOMY/INFUSION INTRACRANIAL INC DIAG ANGIO  01/27/2020       IR PERCUTANEOUS ART  THROMBECTOMY/INFUSION INTRACRANIAL INC DIAG ANGIO  01/27/2020   RADIOLOGY WITH ANESTHESIA N/A 01/27/2020   Procedure: IR WITH ANESTHESIA;  Surgeon: Radiologist, Medication, MD;  Location: [facility];  Service: Radiology;  Laterality: N/A;   TEE WITHOUT CARDIOVERSION N/A 03/11/2020   Procedure: TRANSESOPHAGEAL ECHOCARDIOGRAM (TEE);  Surgeon: [name], MD;  Location: [place];  Service: Cardiovascular;  Laterality: N/A;   TONSILLECTOMY     WISDOM TOOTH EXTRACTION      There were no vitals filed for this visit.          ADULT SLP TREATMENT - 05/13/20 0001      General Information   Behavior/Cognition Alert;Cooperative;Pleasant mood      Treatment Provided   Treatment provided Cognitive-Linquistic      Pain Assessment   Pain Assessment No/denies pain      Cognitive-Linquistic Treatment   Treatment focused on Apraxia;Aphasia    Skilled Treatment SLP introduced Lingraphica to pt's significnat other today, [name]. [name] with questions about whether pt's iPad can do the same thing with an app. SLP explained apps are not as wholly functional as the [place] device - programming and use of pictures, pt's own voice, drawing, are more comprehensive on the [place] than on iPad apps existing at tihs time. AFter this 8 minute discussion SLP engaged pt in some more specific programming of icons using written keywords and  verbal and visual cues especially for numbers. Pt to complete, with assistance, more information on another icon. [name]  or [name] to assist pt using the same technique SLP used today.  Pt typed 90% of the information into the device today. Req'd rare min A faded to independent for delete vs. return keys, adn for using predictive text. SLP stressed to Dorothea Ogle that [place] does not only assist in efficiency of communication but also assists pt in verbal communicaiton with written and auditory stimulation for pt's intended message.      Assessment / Recommendations /  Plan   Plan Continue with current plan of care      Progression Toward Goals   Progression toward goals Progressing toward goals              SLP Short Term Goals - 05/09/20 1132      SLP SHORT TERM GOAL #1   Title Pt will name family, friends and art items with occasional min A 8/10 with approximations allowed.    Time 1    Period Weeks    Status Partially Met      SLP SHORT TERM GOAL #2   Title Pt will name 7 items in personally relevant categories with occasional min A over 2 sessions    Time 1    Period Weeks    Status Partially Met      SLP SHORT TERM GOAL #3   Title Pt will perform 4 automatic speech tasks with rare min A.    Time 1    Period Weeks    Status Partially Met      SLP SHORT TERM GOAL #4   Title Pt will write personal information (name/address/phone), 5 family/friend/pet names and 7  professional words with occasional min A    Time 1    Period Weeks    Status Not Met      SLP SHORT TERM GOAL #5   Title Pt will use mulitmodal communication (gesture, draw, write 1st letter etc) to augment verbal expression to meet needs at home with rare min A from family.    Time 1    Period Weeks    Status Partially Met            SLP Long Term Goals - 05/13/20 0045      SLP LONG TERM GOAL #1   Title Pt will verbalize 3 words to explain or describe his artwork and hobbies with occasional min A over 3 sessions, allowing for intellgible approximations    Time 6    Period Weeks    Status On-going      SLP LONG TERM GOAL #2   Title Pt and family will utilize multimodal compensations for aphasia and verbal apraxia to participate in 3 turns each of conversation with occasional min A over 2 sessions    Time 6    Period Weeks    Status On-going      SLP LONG TERM GOAL #3   Title Pt will write 3 word phrases with less than 2 errors with rare min A over 3 sessions    Time 6    Period Weeks    Status On-going      SLP LONG TERM GOAL #4   Title Reading  assessment if indicated    Time 2    Period Weeks    Status On-going            Plan - 05/13/20 0044    Clinical Impression Statement moderate Broca's aphasia and severe verbal apraxia persist, affecting communication of basic wants, needs and  social interactions. Ongoing training in multimodal communication. Pt has indicated he is interested in SGD trial - SLP in process of obtaining a device for pt to trial - took pt's insurance card and copied today to upload to [employer] site. Continue skilled ST to maximize communication for QOL, safety and reduce family burden.    Speech Therapy Frequency 2x / week    Duration --   12 weeks or 25 visits   Treatment/Interventions Environmental controls;Cueing hierarchy;SLP instruction and feedback;Compensatory strategies;Functional tasks;Cognitive reorganization;Compensatory techniques;Internal/external aids;Multimodal communcation approach;Patient/family education;Language facilitation    Potential to Achieve Goals Good    Potential Considerations Severity of impairments           Patient will benefit from skilled therapeutic intervention in order to improve the following deficits and impairments:   Aphasia  Verbal apraxia    Problem List Patient Active Problem List   Diagnosis Date Noted   Abnormality of gait 04/28/2020   Neurogenic bladder 03/17/2020   Sepsis due to pneumonia ([place]) 03/06/2020   History of stroke with residual deficit 03/06/2020   Transient hypotension 03/06/2020   Spastic hemiplegia affecting nondominant side (HCC)    History of hypertension    Expressive aphasia    AKI (acute kidney injury) ([place])    Global aphasia    Hypoalbuminemia due to protein-calorie malnutrition ([place])    Dysphagia, post-stroke    Hyperlipidemia 02/02/2020   Dysphagia following cerebral infarction 02/02/2020   Aspiration pneumonia ([place]) 02/02/2020   Acute ischemic left MCA stroke ([place]) 02/02/2020   Acute respiratory  failure ([place])    Acute ischemic stroke ([place]) s/p clot retrieval L MCA & ACA A3 01/27/2020   Aspiration into airway 01/27/2020   Chronic anticoagulation 01/27/2020   Paroxysmal atrial fibrillation ([place]) 06/15/2018   Essential hypertension 06/15/2018   NICM (nonischemic cardiomyopathy) ([place]) 06/15/2018   Visit for monitoring Tikosyn therapy 06/12/2018    [facility] ,[place], [place]  05/13/2020, 12:45 AM  [facility] [address] [place], [place], [address] Phone: [phone]   Fax:  [phone]   Name: Dustin Schneider MRN: [id number] Date of Birth: Aug 05, 1956

## 2020-05-13 NOTE — Telephone Encounter (Signed)
Mr Dustin Schneider- guardian- called to say that Mr Dustin Schneider has been taking his baclofen 10 mg instead of the 5 mg and been tolerating. He is asking if it can be changed and will need a new Rx.

## 2020-05-13 NOTE — Telephone Encounter (Signed)
Yes, we can change it.  Thanks.

## 2020-05-13 NOTE — Telephone Encounter (Signed)
[name],   Thank you for putting in the order for a R AFO for Dustin Schneider. He is making great improvements with his gait with PT. In order for him to receive a brace, he also needs written documentation that a R AFO would be necessary to improve gait mechanics and help with spasticity. If possible, would you be able to addend your last note when you saw him on 03/30/20 to include that pt would benefit from use of a R AFO? I saw that he does not see you again until December. Please let me know if that would be possible.    Thanks so much, [name], PT, DPT 05/13/20 2:41 PM     [facility]  [address] [place], [place]  [address] Phone:  [phone] Fax:  [phone]

## 2020-05-13 NOTE — Addendum Note (Signed)
Addended by: Caro Hight on: 05/13/2020 01:03 PM   Modules accepted: Orders

## 2020-05-13 NOTE — Patient Instructions (Signed)
Do the same thing on [employer] [place] statue that we did with [place] monument.

## 2020-05-13 NOTE — Telephone Encounter (Signed)
Order changed and Mr Lurline Del notified.

## 2020-05-16 ENCOUNTER — Encounter: Payer: BC Managed Care – PPO | Admitting: Speech Pathology

## 2020-05-16 ENCOUNTER — Encounter: Payer: BC Managed Care – PPO | Admitting: Occupational Therapy

## 2020-05-17 ENCOUNTER — Other Ambulatory Visit: Payer: Self-pay

## 2020-05-17 ENCOUNTER — Ambulatory Visit: Payer: BC Managed Care – PPO | Attending: Physician Assistant

## 2020-05-17 ENCOUNTER — Ambulatory Visit: Payer: BC Managed Care – PPO

## 2020-05-17 ENCOUNTER — Ambulatory Visit: Payer: BC Managed Care – PPO | Admitting: Occupational Therapy

## 2020-05-17 DIAGNOSIS — R600 Localized edema: Secondary | ICD-10-CM | POA: Diagnosis present

## 2020-05-17 DIAGNOSIS — R262 Difficulty in walking, not elsewhere classified: Secondary | ICD-10-CM | POA: Diagnosis present

## 2020-05-17 DIAGNOSIS — R278 Other lack of coordination: Secondary | ICD-10-CM

## 2020-05-17 DIAGNOSIS — R482 Apraxia: Secondary | ICD-10-CM

## 2020-05-17 DIAGNOSIS — R2681 Unsteadiness on feet: Secondary | ICD-10-CM | POA: Insufficient documentation

## 2020-05-17 DIAGNOSIS — R4701 Aphasia: Secondary | ICD-10-CM | POA: Diagnosis present

## 2020-05-17 DIAGNOSIS — R29818 Other symptoms and signs involving the nervous system: Secondary | ICD-10-CM | POA: Diagnosis present

## 2020-05-17 DIAGNOSIS — I69351 Hemiplegia and hemiparesis following cerebral infarction affecting right dominant side: Secondary | ICD-10-CM

## 2020-05-17 DIAGNOSIS — M6281 Muscle weakness (generalized): Secondary | ICD-10-CM | POA: Diagnosis present

## 2020-05-17 DIAGNOSIS — R2689 Other abnormalities of gait and mobility: Secondary | ICD-10-CM

## 2020-05-17 NOTE — Telephone Encounter (Signed)
No problem.  I made an addendum to my prior note on 8/18 with this information as requested.  Please let me know if I can be of any further assistance!

## 2020-05-17 NOTE — Therapy (Signed)
[facility] [address] [place], [place], [address] Phone: [phone]   Fax:  [phone]  Speech Language Pathology Treatment  Patient Details  Name: Dustin Schneider MRN: [id number] Date of Birth: 24-Aug-1955 Referring Provider (SLP): Dr. Delice Lesch   Encounter Date: 05/17/2020   End of Session - 05/17/20 1241    Visit Number 16    Number of Visits 25    Date for SLP Re-Evaluation 06/15/20    SLP Start Time 1104    SLP Stop Time  1146    SLP Time Calculation (min) 42 min    Activity Tolerance Patient tolerated treatment well           Past Medical History:  Diagnosis Date  . Hypertension   . NICM (nonischemic cardiomyopathy) ([place]) 06/15/2018   04/23/18: EF 45%, 09/15/18: NOrmal LVEF  . Paroxysmal atrial fibrillation ([place]) 06/15/2018  . Stroke ([place])   . Visit for monitoring Tikosyn therapy 06/12/2018    Past Surgical History:  Procedure Laterality Date  . BUBBLE STUDY  03/11/2020   Procedure: BUBBLE STUDY;  Surgeon: [name], MD;  Location: [facility];  Service: Cardiovascular;;  . CARDIOVERSION N/A 05/13/2018   Procedure: CARDIOVERSION;  Surgeon: [name], MD;  Location: [facility] ENDOSCOPY;  Service: Cardiovascular;  Laterality: N/A;  . CARDIOVERSION N/A 06/13/2018   Procedure: CARDIOVERSION;  Surgeon: [name], MD;  Location: [facility] ENDOSCOPY;  Service: Cardiovascular;  Laterality: N/A;  . CARDIOVERSION N/A 06/23/2018   Procedure: CARDIOVERSION;  Surgeon: [name], MD;  Location: [facility] ENDOSCOPY;  Service: Cardiovascular;  Laterality: N/A;  . CARDIOVERSION N/A 03/11/2020   Procedure: CARDIOVERSION;  Surgeon: [name], MD;  Location: [facility];  Service: Cardiovascular;  Laterality: N/A;  . excision of actinic keratosis    . EYE SURGERY     eye lift  . IR CT HEAD LTD  01/27/2020  . IR PERCUTANEOUS ART THROMBECTOMY/INFUSION INTRACRANIAL INC DIAG ANGIO  01/27/2020      . IR PERCUTANEOUS ART  THROMBECTOMY/INFUSION INTRACRANIAL INC DIAG ANGIO  01/27/2020  . RADIOLOGY WITH ANESTHESIA N/A 01/27/2020   Procedure: IR WITH ANESTHESIA;  Surgeon: Radiologist, Medication, MD;  Location: [facility];  Service: Radiology;  Laterality: N/A;  . TEE WITHOUT CARDIOVERSION N/A 03/11/2020   Procedure: TRANSESOPHAGEAL ECHOCARDIOGRAM (TEE);  Surgeon: [name], MD;  Location: [facility];  Service: Cardiovascular;  Laterality: N/A;  . TONSILLECTOMY    . WISDOM TOOTH EXTRACTION      There were no vitals filed for this visit.   Subjective Assessment - 05/17/20 1109    Subjective "yes, yes." (pt, whether or not aide is male or male.)    Patient is accompained by: Family member   BIL - wilson                ADULT SLP TREATMENT - 05/17/20 1119      General Information   Behavior/Cognition Alert;Cooperative;Pleasant mood      Treatment Provided   Treatment provided Cognitive-Linquistic      Cognitive-Linquistic Treatment   Treatment focused on Aphasia;Apraxia    Skilled Treatment SLP worked with pt and [name] today on [employer] - worked with lunch meat - pt only needed occasional min cues for procedure, mod cues usually for typing. Pt is to tell [name] or aide what he wants for each meal using Lingraph[employer], as well as tell whoever is grocery shopping for him 5 things that he wants. Pt got teary when SLP explained he can now communicate  what groceries he wants instead of having people guess what he wants.       Assessment / Recommendations / Plan   Plan Continue with current plan of care      Progression Toward Goals   Progression toward goals Progressing toward goals            SLP Education - 05/17/20 1240    Education Details basic programing of Lingraphica    Person(s) Educated Patient;Caregiver(s)    Methods Explanation;Demonstration;Verbal cues    Comprehension Verbalized understanding;Returned demonstration;Verbal cues required;Need further instruction             SLP Short Term Goals - 05/09/20 1132      SLP SHORT TERM GOAL #1   Title Pt will name family, friends and art items with occasional min A 8/10 with approximations allowed.    Time 1    Period Weeks    Status Partially Met      SLP SHORT TERM GOAL #2   Title Pt will name 7 items in personally relevant categories with occasional min A over 2 sessions    Time 1    Period Weeks    Status Partially Met      SLP SHORT TERM GOAL #3   Title Pt will perform 4 automatic speech tasks with rare min A.    Time 1    Period Weeks    Status Partially Met      SLP SHORT TERM GOAL #4   Title Pt will write personal information (name/address/phone), 5 family/friend/pet names and 7  professional words with occasional min A    Time 1    Period Weeks    Status Not Met      SLP SHORT TERM GOAL #5   Title Pt will use mulitmodal communication (gesture, draw, write 1st letter etc) to augment verbal expression to meet needs at home with rare min A from family.    Time 1    Period Weeks    Status Partially Met            SLP Long Term Goals - 05/17/20 1242      SLP LONG TERM GOAL #1   Title Pt will verbalize 3 words to explain or describe his artwork and hobbies with occasional min A over 3 sessions, allowing for intellgible approximations    Baseline 05-17-20 (Lingraphica cues)    Time 5    Period Weeks    Status On-going      SLP LONG TERM GOAL #2   Title Pt and family will utilize multimodal compensations for aphasia and verbal apraxia to participate in 3 turns each of conversation with occasional min A over 2 sessions    Time 5    Period Weeks    Status On-going      SLP LONG TERM GOAL #3   Title Pt will write 3 word phrases with less than 2 errors with rare min A over 3 sessions    Time 5    Period Weeks    Status On-going      SLP LONG TERM GOAL #4   Title Reading assessment if indicated    Time 1    Period Weeks    Status On-going            Plan - 05/17/20 1241     Clinical Impression Statement moderate Broca's aphasia and severe verbal apraxia persist, affecting communication of basic wants, needs and social interactions. Ongoing training in multimodal  communication - SLP trained pt and [name] today on basic programming; Romani Wilbon") is requiring less cuing for these procedures for basic programming than last week. Continue skilled ST to maximize communication for QOL, safety and reduce family burden.    Speech Therapy Frequency 2x / week    Duration --   12 weeks or 25 visits   Treatment/Interventions Environmental controls;Cueing hierarchy;SLP instruction and feedback;Compensatory strategies;Functional tasks;Cognitive reorganization;Compensatory techniques;Internal/external aids;Multimodal communcation approach;Patient/family education;Language facilitation    Potential to Achieve Goals Good    Potential Considerations Severity of impairments           Patient will benefit from skilled therapeutic intervention in order to improve the following deficits and impairments:   Aphasia  Verbal apraxia    Problem List Patient Active Problem List   Diagnosis Date Noted  . Abnormality of gait 04/28/2020  . Neurogenic bladder 03/17/2020  . Sepsis due to pneumonia ([place]) 03/06/2020  . History of stroke with residual deficit 03/06/2020  . Transient hypotension 03/06/2020  . Spastic hemiplegia affecting nondominant side ([place])   . History of hypertension   . Expressive aphasia   . AKI (acute kidney injury) ([place])   . Global aphasia   . Hypoalbuminemia due to protein-calorie malnutrition ([place])   . Dysphagia, post-stroke   . Hyperlipidemia 02/02/2020  . Dysphagia following cerebral infarction 02/02/2020  . Aspiration pneumonia ([place]) 02/02/2020  . Acute ischemic left MCA stroke ([place]) 02/02/2020  . Acute respiratory failure ([place])   . Acute ischemic stroke [facility]) s/p clot retrieval L MCA & ACA A3 01/27/2020  . Aspiration into airway 01/27/2020  . Chronic  anticoagulation 01/27/2020  . Paroxysmal atrial fibrillation ([place]) 06/15/2018  . Essential hypertension 06/15/2018  . NICM (nonischemic cardiomyopathy) ([place]) 06/15/2018  . Visit for monitoring Tikosyn therapy 06/12/2018    [facility] ,[place], [place]  05/17/2020, 12:43 PM  [facility] [address] [place], [place], [address] Phone: [phone]   Fax:  [phone]   Name: Dustin Schneider MRN: [id number] Date of Birth: [birth year]

## 2020-05-17 NOTE — Therapy (Signed)
[facility] [address] [place], [place], [address] Phone: [phone]   Fax:  [phone]  Physical Therapy Treatment  Patient Details  Name: Dustin Schneider MRN: [id number] Date of Birth: [birth year] Referring Provider (PT): Lauraine Rinne, PA-C (will be followed by Dr. Posey Pronto)   Encounter Date: 05/17/2020   PT End of Session - 05/17/20 0940    Visit Number 19    Number of Visits 25    Date for PT Re-Evaluation 06/14/20    Authorization Type BCBS    PT Start Time 0933    PT Stop Time 1013    PT Time Calculation (min) 40 min    Equipment Utilized During Treatment Gait belt    Activity Tolerance Patient tolerated treatment well    Behavior During Therapy [facility] for tasks assessed/performed           Past Medical History:  Diagnosis Date  . Hypertension   . NICM (nonischemic cardiomyopathy) ([place]) 06/15/2018   04/23/18: EF 45%, 09/15/18: NOrmal LVEF  . Paroxysmal atrial fibrillation ([place]) 06/15/2018  . Stroke ([place])   . Visit for monitoring Tikosyn therapy 06/12/2018    Past Surgical History:  Procedure Laterality Date  . BUBBLE STUDY  03/11/2020   Procedure: BUBBLE STUDY;  Surgeon: [name], MD;  Location: [facility];  Service: Cardiovascular;;  . CARDIOVERSION N/A 05/13/2018   Procedure: CARDIOVERSION;  Surgeon: [name], MD;  Location: [facility] ENDOSCOPY;  Service: Cardiovascular;  Laterality: N/A;  . CARDIOVERSION N/A 06/13/2018   Procedure: CARDIOVERSION;  Surgeon: [name], MD;  Location: [facility] ENDOSCOPY;  Service: Cardiovascular;  Laterality: N/A;  . CARDIOVERSION N/A 06/23/2018   Procedure: CARDIOVERSION;  Surgeon: [name], MD;  Location: [facility] ENDOSCOPY;  Service: Cardiovascular;  Laterality: N/A;  . CARDIOVERSION N/A 03/11/2020   Procedure: CARDIOVERSION;  Surgeon: [name], MD;  Location: [facility];  Service: Cardiovascular;  Laterality: N/A;  . excision of actinic keratosis    . EYE SURGERY      eye lift  . IR CT HEAD LTD  01/27/2020  . IR PERCUTANEOUS ART THROMBECTOMY/INFUSION INTRACRANIAL INC DIAG ANGIO  01/27/2020      . IR PERCUTANEOUS ART THROMBECTOMY/INFUSION INTRACRANIAL INC DIAG ANGIO  01/27/2020  . RADIOLOGY WITH ANESTHESIA N/A 01/27/2020   Procedure: IR WITH ANESTHESIA;  Surgeon: Radiologist, Medication, MD;  Location: [facility];  Service: Radiology;  Laterality: N/A;  . TEE WITHOUT CARDIOVERSION N/A 03/11/2020   Procedure: TRANSESOPHAGEAL ECHOCARDIOGRAM (TEE);  Surgeon: [name], MD;  Location: [facility];  Service: Cardiovascular;  Laterality: N/A;  . TONSILLECTOMY    . WISDOM TOOTH EXTRACTION      There were no vitals filed for this visit.   Subjective Assessment - 05/17/20 0938    Subjective Pt denies any new issues.    Patient is accompained by: Family member    Pertinent History PMH: HTN, paroxysmal a fib    Limitations Standing;Walking    Patient Stated Goals "wants everything working on R side again"    Currently in Pain? No/denies                             [facility] Adult PT Treatment/Exercise - 05/17/20 0941      Bed Mobility   Bed Mobility Supine to Sit;Sit to Supine    Supine to Sit Supervision/Verbal cueing    Sit to Supine Supervision/Verbal cueing      Transfers   Transfers Sit to  Stand;Stand to Sit;Stand Pivot Transfers;Squat Pivot Transfers    Sit to Stand 4: Min guard;5: Supervision    Stand to Sit 4: Min guard    Stand Pivot Transfers 4: Min assist    Stand Pivot Transfer Details (indicate cue type and reason) with RW. Pt did not have AFO donned so pivoting around on LLE due to right ankle instability    Squat Pivot Transfers 5: Supervision;4: Min guard    Squat Pivot Transfer Details (indicate cue type and reason) w/c to/from mat with right AFO donned      Ambulation/Gait   Ambulation/Gait Yes    Ambulation/Gait Assistance 4: Min assist    Ambulation/Gait Assistance Details Pt initially adducting some but improved as went  on. Pt still ER and has increased extensor tone with swing phase. Verbal cues to increase left step length    Ambulation Distance (Feet) 230 Feet    Assistive device Rolling walker   right hand grip attachment, right AFO   Gait Pattern Step-through pattern;Decreased stance time - right;Decreased step length - left;Decreased hip/knee flexion - right    Ambulation Surface Level;Indoor      Standardized Balance Assessment   Standardized Balance Assessment Timed Up and Go Test      Timed Up and Go Test   TUG Normal TUG    Normal TUG (seconds) 29.5   26 and 33 sec     Neuro Re-ed    Neuro Re-ed Details  In // bars: stepping to circle targets alternating feet x 10 each leg, then with RLE stepping forward and back with mod assist to try to facilitate more right hip/knee flexion. Pt with strong extensor tone kicking in making it difficult to bend knee. Step-up on 2" step with LLE x 10 min assist for right foot placement with bringing leg back down.                    PT Short Term Goals - 05/17/20 2019      PT SHORT TERM GOAL #1   Title Pt will perform sit <> stand with RW with supervision in order to demo decr caregiver burden and improved functional BLE strength/balance. ALL STGS DUE 05/18/20    Baseline 05/17/20 supervision/CGA    Time 4    Period Weeks    Status Partially Met    Target Date 05/18/20      PT SHORT TERM GOAL #2   Title Pt will perform sit > supine with supervision and supine > sit with min guard for bed mobility in order to decr caregiver burden.    Baseline 04/15/20 supervision sit to supine, min assist supine to sit as did not get right leg off mat, 05/17/20 supervision    Time 4    Period Weeks    Status Achieved      PT SHORT TERM GOAL #3   Title Pt will undergo assessment of AFO for functional transfers and gait.    Baseline Orthotist has come and determined custom AFO need.    Time 4    Period Weeks    Status Achieved      PT SHORT TERM GOAL #4   Title  Pt will ambulate 27' with RW with mod A x1 in order to improve functional mobility.    Baseline 04/15/20 Mod assist with 2nd person CGA 230' with RW. Ability does vary. 05/17/20 230' min assist with RW and right AFO    Time 4    Period  Weeks    Status Achieved      PT SHORT TERM GOAL #6   Title Pt will perform squat pivot transfers from w/c <> mat table with supervision.    Baseline CGA w/c to/from mat 04/15/20    Time 4    Period Weeks    Status Revised             PT Long Term Goals - 05/17/20 2023      PT LONG TERM GOAL #1   Title Pt and pt's family members will be independent with final HEP in order to build upon functional gains made in therapy. ALL LTGS DUE 06/08/20    Time 12    Period Weeks    Status New      PT LONG TERM GOAL #2   Title Pt will ambulate 115' with min guard/min A with RW and R AFO in order to improve household mobility.    Time 12    Period Weeks    Status New      PT LONG TERM GOAL #3   Title Pt will perform sit <> stand with RW with min guard in order to improve functional BLE strength for transfers.    Time 12    Period Weeks    Status New      PT LONG TERM GOAL #4   Title Pt will perform squat pivot transfer with mod I vs. stand pivot transfer with RW with min guard in order to decr caregiver burden.    Time 12    Period Weeks    Status New      PT LONG TERM GOAL #5   Title Pt will decrease TUG from 29.5 sec to <25 sec with RW for improved balance and decreased fall risk.    Baseline 05/17/20 29.5 sec with RW    Time 4    Period Weeks    Status New    Target Date 06/08/20                 Plan - 05/17/20 1015    Clinical Impression Statement PT assessed STGs today. Pt showing good progress towards all meeting all but one. He is requiring decreased assistance with transfers and gait. Has been assessed for AFO and is in the process of getting one. PT assessed TUG today and pt high fall risk with score of 29.5 sec. Pt continues to  benefit from skilled PT to continue to address strength, balance and functional mobility deficits.    Personal Factors and Comorbidities Comorbidity 3+    Comorbidities dense L MCA CVA, paroxysmal A. fib on chronic Xarelto, nonischemic cardiomyopathy and hypertension.    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, works as an Electrical engineer Evolving/Moderate complexity    Rehab Potential Good    PT Frequency 2x / week    PT Duration 12 weeks    PT Treatment/Interventions ADLs/Self Care Home Management;Aquatic Therapy;DME Instruction;Gait training;Stair training;Functional mobility training;Therapeutic activities;Therapeutic exercise;Electrical Stimulation;Balance training;Neuromuscular re-education;Wheelchair mobility training;Orthotic Fit/Training;Patient/family education;Passive range of motion;Energy conservation    PT Next Visit Plan Chloe was contacting [employer] to be sure they called [name] with updates. Can we try quadruped or tall kneeling? hip ABD/quad strengthening, weight shifting/NMR to RLE. continue gait training with brace, t-strap and hand orthotic, bioness to RLE (ant tib and quad) for strengthening/muscle re-ed    Consulted and Agree with Plan  of Care Patient;Family member/caregiver           Patient will benefit from skilled therapeutic intervention in order to improve the following deficits and impairments:  Abnormal gait, Decreased activity tolerance, Decreased balance, Decreased cognition, Decreased mobility, Decreased coordination, Decreased range of motion, Decreased endurance, Decreased strength, Hypomobility, Difficulty walking, Impaired UE functional use, Impaired vision/preception, Postural dysfunction, Impaired sensation  Visit Diagnosis: Other  abnormalities of gait and mobility  Muscle weakness (generalized)  Hemiplegia and hemiparesis following cerebral infarction affecting right dominant side [facility])     Problem List Patient Active Problem List   Diagnosis Date Noted  . Abnormality of gait 04/28/2020  . Neurogenic bladder 03/17/2020  . Sepsis due to pneumonia ([place]) 03/06/2020  . History of stroke with residual deficit 03/06/2020  . Transient hypotension 03/06/2020  . Spastic hemiplegia affecting nondominant side ([place])   . History of hypertension   . Expressive aphasia   . AKI (acute kidney injury) ([place])   . Global aphasia   . Hypoalbuminemia due to protein-calorie malnutrition ([place])   . Dysphagia, post-stroke   . Hyperlipidemia 02/02/2020  . Dysphagia following cerebral infarction 02/02/2020  . Aspiration pneumonia ([place]) 02/02/2020  . Acute ischemic left MCA stroke ([place]) 02/02/2020  . Acute respiratory failure ([place])   . Acute ischemic stroke [facility]) s/p clot retrieval L MCA & ACA A3 01/27/2020  . Aspiration into airway 01/27/2020  . Chronic anticoagulation 01/27/2020  . Paroxysmal atrial fibrillation ([place]) 06/15/2018  . Essential hypertension 06/15/2018  . NICM (nonischemic cardiomyopathy) ([place]) 06/15/2018  . Visit for monitoring Tikosyn therapy 06/12/2018    Electa Sniff, PT, DPT, NCS 05/17/2020, 8:26 PM  [facility] [address] [place], [place], [address] Phone: [phone]   Fax:  [phone]  Name: Dustin Schneider MRN: [id number] Date of Birth: November 02, 1955

## 2020-05-17 NOTE — Patient Instructions (Signed)
Tell your aides or [name] what you'd like for each meal.   The next time someone goes to the grocery store, tell them 5 things that you want.

## 2020-05-17 NOTE — Therapy (Signed)
[facility] [address] [place], [place], [address] Phone: [phone]   Fax:  [phone]  Occupational Therapy Treatment  Patient Details  Name: Dustin Schneider MRN: [id number] Date of Birth: February 23, 1956 No data recorded  Encounter Date: 05/17/2020   OT End of Session - 05/17/20 1427    Visit Number 16    Number of Visits 25    Date for OT Re-Evaluation 06/23/20    Authorization Type BC/BS - No auth required, covered 100%    OT Start Time 1015    OT Stop Time 1100    OT Time Calculation (min) 45 min    Activity Tolerance Patient tolerated treatment well    Behavior During Therapy [facility] for tasks assessed/performed           Past Medical History:  Diagnosis Date  . Hypertension   . NICM (nonischemic cardiomyopathy) ([place]) 06/15/2018   04/23/18: EF 45%, 09/15/18: NOrmal LVEF  . Paroxysmal atrial fibrillation ([place]) 06/15/2018  . Stroke ([place])   . Visit for monitoring Tikosyn therapy 06/12/2018    Past Surgical History:  Procedure Laterality Date  . BUBBLE STUDY  03/11/2020   Procedure: BUBBLE STUDY;  Surgeon: [name], MD;  Location: [place];  Service: Cardiovascular;;  . CARDIOVERSION N/A 05/13/2018   Procedure: CARDIOVERSION;  Surgeon: [name], MD;  Location: [facility] ENDOSCOPY;  Service: Cardiovascular;  Laterality: N/A;  . CARDIOVERSION N/A 06/13/2018   Procedure: CARDIOVERSION;  Surgeon: [name], MD;  Location: [facility] ENDOSCOPY;  Service: Cardiovascular;  Laterality: N/A;  . CARDIOVERSION N/A 06/23/2018   Procedure: CARDIOVERSION;  Surgeon: [name], MD;  Location: [facility] ENDOSCOPY;  Service: Cardiovascular;  Laterality: N/A;  . CARDIOVERSION N/A 03/11/2020   Procedure: CARDIOVERSION;  Surgeon: [name], MD;  Location: [place];  Service: Cardiovascular;  Laterality: N/A;  . excision of actinic keratosis    . EYE SURGERY     eye lift  . IR CT HEAD LTD  01/27/2020  . IR PERCUTANEOUS ART  THROMBECTOMY/INFUSION INTRACRANIAL INC DIAG ANGIO  01/27/2020      . IR PERCUTANEOUS ART THROMBECTOMY/INFUSION INTRACRANIAL INC DIAG ANGIO  01/27/2020  . RADIOLOGY WITH ANESTHESIA N/A 01/27/2020   Procedure: IR WITH ANESTHESIA;  Surgeon: Radiologist, Medication, MD;  Location: [place];  Service: Radiology;  Laterality: N/A;  . TEE WITHOUT CARDIOVERSION N/A 03/11/2020   Procedure: TRANSESOPHAGEAL ECHOCARDIOGRAM (TEE);  Surgeon: [name], MD;  Location: [place];  Service: Cardiovascular;  Laterality: N/A;  . TONSILLECTOMY    . WISDOM TOOTH EXTRACTION      There were no vitals filed for this visit.   Subjective Assessment - 05/17/20 1022    Subjective  Fine    Patient is accompanied by: Family member    Pertinent History Lt MCA CVA 01/27/20, readmitted to hospital with HAP and fever on 03/06/20 and released 03/11/20. PMH: paroxsymal A-fib, HTN    Limitations fall    Currently in Pain? No/denies           Practiced simulated eating with greater difficulty today. Pt had more trouble with tremors/shaking and sufficient supination. Pt required hand over spoon technique today vs. Under.  Practiced placing blocks in from one side of box to another with mod to max difficulty/drops. Pt picking up checker pieces and placing in bowl on higher surface for coordination and functional mid level reaching w/ cues to prevent sh compensations and shelf liner placed under checkers for ease in picking up.  UBE x 5  min. With Rt hand wrapped for reciprocal movement pattern.                        OT Short Term Goals - 05/05/20 1125      OT SHORT TERM GOAL #1   Title Independent with initial HEP    Time 4    Period Weeks    Status Achieved      OT SHORT TERM GOAL #2   Title Pt to perform low to mid level reaching of light objects RUE in prep for feeding self and bathing    Time 4    Period Weeks    Status Achieved   04/20/20     OT SHORT TERM GOAL #3   Title Pt to feed self 50% of  the time with Rt dominant hand and A/E prn    Time 4    Period Weeks    Status On-going   04/20/20: able to eat finger foods     OT SHORT TERM GOAL #4   Title Pt to perform UE dressing of pullover shirt mod I level    Time 4    Period Weeks    Status Achieved   met per pt and brother     OT SHORT TERM GOAL #5   Title Pt to perform LE dressing and all bathing with no more than mod assist using A/E and strategies prn    Time 4    Period Weeks    Status Achieved   04/20/20:  set-up for bedside bath, mod A for LE dressing     OT SHORT TERM GOAL #6   Title Pt to perform toileting and toilet transfers with no more than min assist overall    Time 4    Period Weeks    Status On-going      OT SHORT TERM GOAL #7   Title Improve functional use of RUE as evidenced by performing 8 blocks on Box & Blocks test    Baseline 0    Time 4    Period Weeks    Status Achieved   04/20/20:  6 blocks, 05/05/20: 12 blocks            OT Long Term Goals - 05/05/20 1126      OT LONG TERM GOAL #1   Title Pt to be independent with updated HEP    Time 12    Period Weeks    Status New      OT LONG TERM GOAL #2   Title Pt to perform all BADLS at mod I level    Time 12    Period Weeks    Status On-going      OT LONG TERM GOAL #3   Title Pt to feed self 75% or greater with Rt dominant hand and grooming 50% or more with Rt hand    Time 12    Period Weeks    Status New      OT LONG TERM GOAL #4   Title Pt to retrieve light weight objects from high shelf RUE consistently    Time 12    Period Weeks    Status New      OT LONG TERM GOAL #5   Title Pt to demo 25 lbs grip strength or more Rt dominant hand for gripping, opening jars/containers    Time 12    Period Weeks    Status On-going   12.5 lbs  OT LONG TERM GOAL #6   Title Pt to perform simple meal prep and light cleaning tasks mod I level    Time 12    Period Weeks    Status New      OT LONG TERM GOAL #7   Title Improve functional use  RUE as evidenced by performing 25 blocks on Box & Blocks and performing 9 hole peg test in 90 sec or under    Time 12    Period Weeks    Status On-going   12 blocks     OT LONG TERM GOAL #8   Title Pt to returned to modified art/sculpting tasks with task modifications and A/E prn    Time 12    Period Weeks    Status New                 Plan - 05/17/20 1428    Clinical Impression Statement Pt appeared to be tighter in Rt hand today with increased tremors/shaking during challenging activity    Occupational performance deficits (Please refer to evaluation for details): ADL's;IADL's;Work;Leisure;Social Participation    Body Structure / Function / Physical Skills ADL;ROM;Dexterity;Balance;IADL;Body mechanics;Improper spinal/pelvic alignment;Sensation;Mobility;Flexibility;Strength;Coordination;FMC;Tone;UE functional use;Decreased knowledge of use of DME;Proprioception    Rehab Potential Good    OT Frequency 2x / week    OT Duration 12 weeks    OT Treatment/Interventions Self-care/ADL training;Therapeutic exercise;Functional Mobility Training;Neuromuscular education;Manual Therapy;Splinting;Aquatic Therapy;Manual lymph drainage;Therapeutic activities;Coping strategies training;DME and/or AE instruction;Electrical Stimulation;Fluidtherapy;Passive range of motion;Patient/family education    Plan try wt bearing over BUE's in standing or modified quadraped if able, pt may benefit from botox for Rt wrist and forearm    Consulted and Agree with Plan of Care Patient           Patient will benefit from skilled therapeutic intervention in order to improve the following deficits and impairments:   Body Structure / Function / Physical Skills: ADL, ROM, Dexterity, Balance, IADL, Body mechanics, Improper spinal/pelvic alignment, Sensation, Mobility, Flexibility, Strength, Coordination, FMC, Tone, UE functional use, Decreased knowledge of use of DME, Proprioception       Visit  Diagnosis: Hemiplegia and hemiparesis following cerebral infarction affecting right dominant side (HCC)  Other lack of coordination  Muscle weakness (generalized)    Problem List Patient Active Problem List   Diagnosis Date Noted  . Abnormality of gait 04/28/2020  . Neurogenic bladder 03/17/2020  . Sepsis due to pneumonia (HCC) 03/06/2020  . History of stroke with residual deficit 03/06/2020  . Transient hypotension 03/06/2020  . Spastic hemiplegia affecting nondominant side (HCC)   . History of hypertension   . Expressive aphasia   . AKI (acute kidney injury) (HCC)   . Global aphasia   . Hypoalbuminemia due to protein-calorie malnutrition (HCC)   . Dysphagia, post-stroke   . Hyperlipidemia 02/02/2020  . Dysphagia following cerebral infarction 02/02/2020  . Aspiration pneumonia (HCC) 02/02/2020  . Acute ischemic left MCA stroke (HCC) 02/02/2020  . Acute respiratory failure (HCC)   . Acute ischemic stroke [facility]) s/p clot retrieval L MCA & ACA A3 01/27/2020  . Aspiration into airway 01/27/2020  . Chronic anticoagulation 01/27/2020  . Paroxysmal atrial fibrillation (HCC) 06/15/2018  . Essential hypertension 06/15/2018  . NICM (nonischemic cardiomyopathy) (HCC) 06/15/2018  . Visit for monitoring Tikosyn therapy 06/12/2018    Kelli Churn, OTR/L 05/17/2020, 2:31 PM  [facility] [address] [place], [place], [address] Phone: [phone]   Fax:  [phone]  Name: Dustin  SAIFAN Schneider MRN: [id number] Date of Birth: May 03, 1956

## 2020-05-20 ENCOUNTER — Encounter: Payer: Self-pay | Admitting: Physical Therapy

## 2020-05-20 ENCOUNTER — Ambulatory Visit: Payer: BC Managed Care – PPO | Admitting: Occupational Therapy

## 2020-05-20 ENCOUNTER — Encounter: Payer: Self-pay | Admitting: Occupational Therapy

## 2020-05-20 ENCOUNTER — Ambulatory Visit: Payer: BC Managed Care – PPO

## 2020-05-20 ENCOUNTER — Other Ambulatory Visit: Payer: Self-pay

## 2020-05-20 ENCOUNTER — Ambulatory Visit: Payer: BC Managed Care – PPO | Admitting: Physical Therapy

## 2020-05-20 DIAGNOSIS — R2681 Unsteadiness on feet: Secondary | ICD-10-CM

## 2020-05-20 DIAGNOSIS — R2689 Other abnormalities of gait and mobility: Secondary | ICD-10-CM

## 2020-05-20 DIAGNOSIS — R278 Other lack of coordination: Secondary | ICD-10-CM

## 2020-05-20 DIAGNOSIS — I69351 Hemiplegia and hemiparesis following cerebral infarction affecting right dominant side: Secondary | ICD-10-CM

## 2020-05-20 DIAGNOSIS — M6281 Muscle weakness (generalized): Secondary | ICD-10-CM

## 2020-05-20 DIAGNOSIS — R262 Difficulty in walking, not elsewhere classified: Secondary | ICD-10-CM

## 2020-05-20 DIAGNOSIS — R29818 Other symptoms and signs involving the nervous system: Secondary | ICD-10-CM

## 2020-05-20 NOTE — Therapy (Signed)
[facility] [address] [place], [place], [address] Phone: [phone]   Fax:  [phone]  Physical Therapy Treatment  Patient Details  Name: ERICBERTO PADGET MRN: [id number] Date of Birth: [birth year] Referring Provider (PT): Lauraine Rinne, PA-C (will be followed by Dr. Posey Pronto)   Encounter Date: 05/20/2020   PT End of Session - 05/20/20 0803    Visit Number 20    Number of Visits 25    Date for PT Re-Evaluation 06/14/20    Authorization Type BCBS    PT Start Time 0802    PT Stop Time 0845    PT Time Calculation (min) 43 min    Equipment Utilized During Treatment Gait belt    Activity Tolerance Patient tolerated treatment well    Behavior During Therapy [facility] for tasks assessed/performed           Past Medical History:  Diagnosis Date  . Hypertension   . NICM (nonischemic cardiomyopathy) ([place]) 06/15/2018   04/23/18: EF 45%, 09/15/18: NOrmal LVEF  . Paroxysmal atrial fibrillation ([place]) 06/15/2018  . Stroke ([place])   . Visit for monitoring Tikosyn therapy 06/12/2018    Past Surgical History:  Procedure Laterality Date  . BUBBLE STUDY  03/11/2020   Procedure: BUBBLE STUDY;  Surgeon: [name], MD;  Location: [place];  Service: Cardiovascular;;  . CARDIOVERSION N/A 05/13/2018   Procedure: CARDIOVERSION;  Surgeon: [name], MD;  Location: [facility] ENDOSCOPY;  Service: Cardiovascular;  Laterality: N/A;  . CARDIOVERSION N/A 06/13/2018   Procedure: CARDIOVERSION;  Surgeon: [name], MD;  Location: [facility] ENDOSCOPY;  Service: Cardiovascular;  Laterality: N/A;  . CARDIOVERSION N/A 06/23/2018   Procedure: CARDIOVERSION;  Surgeon: [name], MD;  Location: [facility] ENDOSCOPY;  Service: Cardiovascular;  Laterality: N/A;  . CARDIOVERSION N/A 03/11/2020   Procedure: CARDIOVERSION;  Surgeon: [name], MD;  Location: [place];  Service: Cardiovascular;  Laterality: N/A;  . excision of actinic keratosis    . EYE SURGERY      eye lift  . IR CT HEAD LTD  01/27/2020  . IR PERCUTANEOUS ART THROMBECTOMY/INFUSION INTRACRANIAL INC DIAG ANGIO  01/27/2020      . IR PERCUTANEOUS ART THROMBECTOMY/INFUSION INTRACRANIAL INC DIAG ANGIO  01/27/2020  . RADIOLOGY WITH ANESTHESIA N/A 01/27/2020   Procedure: IR WITH ANESTHESIA;  Surgeon: Radiologist, Medication, MD;  Location: [place];  Service: Radiology;  Laterality: N/A;  . TEE WITHOUT CARDIOVERSION N/A 03/11/2020   Procedure: TRANSESOPHAGEAL ECHOCARDIOGRAM (TEE);  Surgeon: [name], MD;  Location: [place];  Service: Cardiovascular;  Laterality: N/A;  . TONSILLECTOMY    . WISDOM TOOTH EXTRACTION      There were no vitals filed for this visit.   Subjective Assessment - 05/20/20 0803    Subjective No new complaints. No falls or pain to report.    Patient is accompained by: Family member   sister   Pertinent History PMH: HTN, paroxysmal a fib    Limitations Standing;Walking    Patient Stated Goals "wants everything working on R side again"    Currently in Pain? No/denies                [facility] Adult PT Treatment/Exercise - 05/20/20 0805      Transfers   Transfers Sit to Stand;Stand to Sit;Stand Pivot Transfers;Squat Pivot Transfers    Sit to Stand 4: Min guard    Sit to Stand Details Verbal cues for technique;Tactile cues for weight shifting    Stand to Sit 4: Min  guard    Stand to Sit Details (indicate cue type and reason) Verbal cues for technique    Stand Pivot Transfers 4: Min assist    Stand Pivot Transfer Details (indicate cue type and reason) with AFO with T-strap on right LE, pt holding onto PTA and taking 3-4 pivot steps.       Ambulation/Gait   Ambulation/Gait Yes    Ambulation/Gait Assistance 4: Min assist   second person near by for safety   Ambulation/Gait Assistance Details pt needs cues to slow down with gait to allow for increased RLE stance time, increased left step length and occasional assistance for right step placement due to scissoring at  times. with pt slows down this improves. cues for upright posture as well with gait.     Ambulation Distance (Feet) 115 Feet   x1   Assistive device Rolling walker   right hand orthosis. right AFO   Gait Pattern Step-through pattern;Decreased stance time - right;Decreased step length - left;Decreased hip/knee flexion - right    Ambulation Surface Level;Indoor    Gait Comments use of right anterrior ottobock with T-strap to right LE with session.       Neuro Re-ed    Neuro Re-ed Details  for strengthening/muscle re-ed: in tall kneeling- min assist to get into/out of this position, with UE support on blue kaye bench- in tall posture worked on lateral weight shifting with emphasis on right side with cues for pt to "bump" right hip into PTA hip with assist needed for weight shifting and to stay tall. then worked on [employer] in midline with equal weight on bil LE's with min assit. then back in upright posture had pt reaching with left UE across midline/upwards to retrieve/give cone to second person with empahsis on right side weight bearing with trunk elongation. back to both hand on bench had pt work on moving left LE out/in for several reps. assist/facilation needed with all tall kneeling activites for posture and weight shifting; seated at edge of mat- in staggered stance with left foot forward for sit<>stands x 10 reps, support to right LE for stability, UE support on chair back in standing to assist with balance at times;  standing with UE support- right single leg squats with left foot on 4 inch box x 10 reps with guarding of right knee for safety, then right step taps to 4 inch box with emphasis on hip/knee flexion. min guard to min assist for balance in standing.                  PT Short Term Goals - 05/17/20 2019      PT SHORT TERM GOAL #1   Title Pt will perform sit <> stand with RW with supervision in order to demo decr caregiver burden and improved functional BLE strength/balance. ALL  STGS DUE 05/18/20    Baseline 05/17/20 supervision/CGA    Time 4    Period Weeks    Status Partially Met    Target Date 05/18/20      PT SHORT TERM GOAL #2   Title Pt will perform sit > supine with supervision and supine > sit with min guard for bed mobility in order to decr caregiver burden.    Baseline 04/15/20 supervision sit to supine, min assist supine to sit as did not get right leg off mat, 05/17/20 supervision    Time 4    Period Weeks    Status Achieved      PT SHORT  TERM GOAL #3   Title Pt will undergo assessment of AFO for functional transfers and gait.    Baseline Orthotist has come and determined custom AFO need.    Time 4    Period Weeks    Status Achieved      PT SHORT TERM GOAL #4   Title Pt will ambulate 73' with RW with mod A x1 in order to improve functional mobility.    Baseline 04/15/20 Mod assist with 2nd person CGA 230' with RW. Ability does vary. 05/17/20 230' min assist with RW and right AFO    Time 4    Period Weeks    Status Achieved      PT SHORT TERM GOAL #6   Title Pt will perform squat pivot transfers from w/c <> mat table with supervision.    Baseline CGA w/c to/from mat 04/15/20    Time 4    Period Weeks    Status Revised             PT Long Term Goals - 05/17/20 2023      PT LONG TERM GOAL #1   Title Pt and pt's family members will be independent with final HEP in order to build upon functional gains made in therapy. ALL LTGS DUE 06/08/20    Time 12    Period Weeks    Status New      PT LONG TERM GOAL #2   Title Pt will ambulate 115' with min guard/min A with RW and R AFO in order to improve household mobility.    Time 12    Period Weeks    Status New      PT LONG TERM GOAL #3   Title Pt will perform sit <> stand with RW with min guard in order to improve functional BLE strength for transfers.    Time 12    Period Weeks    Status New      PT LONG TERM GOAL #4   Title Pt will perform squat pivot transfer with mod I vs. stand pivot  transfer with RW with min guard in order to decr caregiver burden.    Time 12    Period Weeks    Status New      PT LONG TERM GOAL #5   Title Pt will decrease TUG from 29.5 sec to <25 sec with RW for improved balance and decreased fall risk.    Baseline 05/17/20 29.5 sec with RW    Time 4    Period Weeks    Status New    Target Date 06/08/20                 Plan - 05/20/20 0804    Clinical Impression Statement Today's skilled session focused on RLE strengthening and weight bearing with activities and gait. Pt with visable muscle tremors from fatigue at end of session, however was motivated to continue. Rest breaks taken as needed. The pt is progressing toward goals and should benefit from continued PT to progress toward unmet goals.    Personal Factors and Comorbidities Comorbidity 3+    Comorbidities dense L MCA CVA, paroxysmal A. fib on chronic Xarelto, nonischemic cardiomyopathy and hypertension.    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, works as an Electrical engineer Evolving/Moderate complexity    Rehab Potential Good    PT Frequency 2x / week  PT Duration 12 weeks    PT Treatment/Interventions ADLs/Self Care Home Management;Aquatic Therapy;DME Instruction;Gait training;Stair training;Functional mobility training;Therapeutic activities;Therapeutic exercise;Electrical Stimulation;Balance training;Neuromuscular re-education;Wheelchair mobility training;Orthotic Fit/Training;Patient/family education;Passive range of motion;Energy conservation    PT Next Visit Plan continue with tall kneeling. attempt quadruped again on a day not doing tall kneeling as pt fatigues with both; hip ABD/quad strengthening, weight shifting/NMR to RLE. continue gait training with brace,  t-strap and hand orthotic, bioness to RLE (ant tib and quad) for strengthening/muscle re-ed    Recommended Other Services going to [facility] on 05/31/20 for casting for custom brace    Consulted and Agree with Plan of Care Patient;Family member/caregiver           Patient will benefit from skilled therapeutic intervention in order to improve the following deficits and impairments:  Abnormal gait, Decreased activity tolerance, Decreased balance, Decreased cognition, Decreased mobility, Decreased coordination, Decreased range of motion, Decreased endurance, Decreased strength, Hypomobility, Difficulty walking, Impaired UE functional use, Impaired vision/preception, Postural dysfunction, Impaired sensation  Visit Diagnosis: Hemiplegia and hemiparesis following cerebral infarction affecting right dominant side (HCC)  Muscle weakness (generalized)  Other abnormalities of gait and mobility  Unsteadiness on feet  Other lack of coordination  Difficulty in walking, not elsewhere classified     Problem List Patient Active Problem List   Diagnosis Date Noted  . Abnormality of gait 04/28/2020  . Neurogenic bladder 03/17/2020  . Sepsis due to pneumonia ([place]) 03/06/2020  . History of stroke with residual deficit 03/06/2020  . Transient hypotension 03/06/2020  . Spastic hemiplegia affecting nondominant side ([place])   . History of hypertension   . Expressive aphasia   . AKI (acute kidney injury) ([place])   . Global aphasia   . Hypoalbuminemia due to protein-calorie malnutrition ([place])   . Dysphagia, post-stroke   . Hyperlipidemia 02/02/2020  . Dysphagia following cerebral infarction 02/02/2020  . Aspiration pneumonia ([place]) 02/02/2020  . Acute ischemic left MCA stroke ([place]) 02/02/2020  . Acute respiratory failure ([place])   . Acute ischemic stroke [facility]) s/p clot retrieval L MCA & ACA A3 01/27/2020  . Aspiration into airway 01/27/2020  . Chronic anticoagulation 01/27/2020  . Paroxysmal atrial  fibrillation ([place]) 06/15/2018  . Essential hypertension 06/15/2018  . NICM (nonischemic cardiomyopathy) ([place]) 06/15/2018  . Visit for monitoring Tikosyn therapy 06/12/2018    Willow Ora, PTA, [facility] [address], [place], [place] [address] [phone] 05/20/20, 2:39 PM   Name: Dustin Schneider MRN: [id number] Date of Birth: 22-Aug-1955

## 2020-05-20 NOTE — Therapy (Signed)
[facility] [address] [place], [place], [address] Phone: [phone]   Fax:  [phone]  Occupational Therapy Treatment  Patient Details  Name: Dustin Schneider MRN: [id number] Date of Birth: [birth year] No data recorded  Encounter Date: 05/20/2020   OT End of Session - 05/20/20 0904    Visit Number 17    Number of Visits 25    Date for OT Re-Evaluation 06/23/20    Authorization Type BC/BS - No auth required, covered 100%    OT Start Time 0848    OT Stop Time 0928    OT Time Calculation (min) 40 min    Activity Tolerance Patient tolerated treatment well    Behavior During Therapy [facility] for tasks assessed/performed           Past Medical History:  Diagnosis Date  . Hypertension   . NICM (nonischemic cardiomyopathy) ([place]) 06/15/2018   04/23/18: EF 45%, 09/15/18: NOrmal LVEF  . Paroxysmal atrial fibrillation ([place]) 06/15/2018  . Stroke ([place])   . Visit for monitoring Tikosyn therapy 06/12/2018    Past Surgical History:  Procedure Laterality Date  . BUBBLE STUDY  03/11/2020   Procedure: BUBBLE STUDY;  Surgeon: [name], MD;  Location: [facility];  Service: Cardiovascular;;  . CARDIOVERSION N/A 05/13/2018   Procedure: CARDIOVERSION;  Surgeon: [name], MD;  Location: [facility] ENDOSCOPY;  Service: Cardiovascular;  Laterality: N/A;  . CARDIOVERSION N/A 06/13/2018   Procedure: CARDIOVERSION;  Surgeon: [name], MD;  Location: [facility] ENDOSCOPY;  Service: Cardiovascular;  Laterality: N/A;  . CARDIOVERSION N/A 06/23/2018   Procedure: CARDIOVERSION;  Surgeon: [name], MD;  Location: [facility] ENDOSCOPY;  Service: Cardiovascular;  Laterality: N/A;  . CARDIOVERSION N/A 03/11/2020   Procedure: CARDIOVERSION;  Surgeon: [name], MD;  Location: [facility];  Service: Cardiovascular;  Laterality: N/A;  . excision of actinic keratosis    . EYE SURGERY     eye lift  . IR CT HEAD LTD  01/27/2020  . IR PERCUTANEOUS ART  THROMBECTOMY/INFUSION INTRACRANIAL INC DIAG ANGIO  01/27/2020      . IR PERCUTANEOUS ART THROMBECTOMY/INFUSION INTRACRANIAL INC DIAG ANGIO  01/27/2020  . RADIOLOGY WITH ANESTHESIA N/A 01/27/2020   Procedure: IR WITH ANESTHESIA;  Surgeon: Radiologist, Medication, MD;  Location: [facility];  Service: Radiology;  Laterality: N/A;  . TEE WITHOUT CARDIOVERSION N/A 03/11/2020   Procedure: TRANSESOPHAGEAL ECHOCARDIOGRAM (TEE);  Surgeon: [name], MD;  Location: [facility];  Service: Cardiovascular;  Laterality: N/A;  . TONSILLECTOMY    . WISDOM TOOTH EXTRACTION      There were no vitals filed for this visit.   Subjective Assessment - 05/20/20 0903    Pertinent History Lt MCA CVA 01/27/20, readmitted to hospital with HAP and fever on 03/06/20 and released 03/11/20. PMH: paroxsymal A-fib, HTN    Limitations fall    Currently in Pain? No/denies               Treatment: Closed chain shoulder flexion with medium foam roll seated mid range, min v.c Weightbearing through bilateral UE's edge of mat while rocking forwards min facilitation Attempted weightbearing through bilateral UE's in standing, however RLE was too fatigued following PT and shaking, so activity was discontinued.. Placing large and medium pegs in semicircle with mod difficulty, min v.c then removing small to large pegs with min difficulty / v.c Dealing playing cards with RUE, min-mod faiclitation, the pushing card off deck with finger extension, mod -max facilitation  Arm bike x 5 mins  level 1 with RUE wrapped for reciprocal movement                    OT Short Term Goals - 05/05/20 1125      OT SHORT TERM GOAL #1   Title Independent with initial HEP    Time 4    Period Weeks    Status Achieved      OT SHORT TERM GOAL #2   Title Pt to perform low to mid level reaching of light objects RUE in prep for feeding self and bathing    Time 4    Period Weeks    Status Achieved   04/20/20     OT SHORT TERM GOAL #3    Title Pt to feed self 50% of the time with Rt dominant hand and A/E prn    Time 4    Period Weeks    Status On-going   04/20/20: able to eat finger foods     OT SHORT TERM GOAL #4   Title Pt to perform UE dressing of pullover shirt mod I level    Time 4    Period Weeks    Status Achieved   met per pt and brother     OT SHORT TERM GOAL #5   Title Pt to perform LE dressing and all bathing with no more than mod assist using A/E and strategies prn    Time 4    Period Weeks    Status Achieved   04/20/20:  set-up for bedside bath, mod A for LE dressing     OT SHORT TERM GOAL #6   Title Pt to perform toileting and toilet transfers with no more than min assist overall    Time 4    Period Weeks    Status On-going      OT SHORT TERM GOAL #7   Title Improve functional use of RUE as evidenced by performing 8 blocks on Box & Blocks test    Baseline 0    Time 4    Period Weeks    Status Achieved   04/20/20:  6 blocks, 05/05/20: 12 blocks            OT Long Term Goals - 05/05/20 1126      OT LONG TERM GOAL #1   Title Pt to be independent with updated HEP    Time 12    Period Weeks    Status New      OT LONG TERM GOAL #2   Title Pt to perform all BADLS at mod I level    Time 12    Period Weeks    Status On-going      OT LONG TERM GOAL #3   Title Pt to feed self 75% or greater with Rt dominant hand and grooming 50% or more with Rt hand    Time 12    Period Weeks    Status New      OT LONG TERM GOAL #4   Title Pt to retrieve light weight objects from high shelf RUE consistently    Time 12    Period Weeks    Status New      OT LONG TERM GOAL #5   Title Pt to demo 25 lbs grip strength or more Rt dominant hand for gripping, opening jars/containers    Time 12    Period Weeks    Status On-going   12.5 lbs     OT LONG  TERM GOAL #6   Title Pt to perform simple meal prep and light cleaning tasks mod I level    Time 12    Period Weeks    Status New      OT LONG TERM GOAL #7    Title Improve functional use RUE as evidenced by performing 25 blocks on Box & Blocks and performing 9 hole peg test in 90 sec or under    Time 12    Period Weeks    Status On-going   12 blocks     OT LONG TERM GOAL #8   Title Pt to returned to modified art/sculpting tasks with task modifications and A/E prn    Time 12    Period Weeks    Status New                 Plan - 05/20/20 0905    Clinical Impression Statement Pt is progressing towards gaols with improving functional use of RUE.    Occupational performance deficits (Please refer to evaluation for details): ADL's;IADL's;Work;Leisure;Social Participation    Body Structure / Function / Physical Skills ADL;ROM;Dexterity;Balance;IADL;Body mechanics;Improper spinal/pelvic alignment;Sensation;Mobility;Flexibility;Strength;Coordination;FMC;Tone;UE functional use;Decreased knowledge of use of DME;Proprioception    Rehab Potential Good    OT Frequency 2x / week    OT Duration 12 weeks    OT Treatment/Interventions Self-care/ADL training;Therapeutic exercise;Functional Mobility Training;Neuromuscular education;Manual Therapy;Splinting;Aquatic Therapy;Manual lymph drainage;Therapeutic activities;Coping strategies training;DME and/or AE instruction;Electrical Stimulation;Fluidtherapy;Passive range of motion;Patient/family education    Plan try wt bearing over BUE's in standing or modified quadraped if able, pt may benefit from botox for Rt wrist and forearm    Consulted and Agree with Plan of Care Patient           Patient will benefit from skilled therapeutic intervention in order to improve the following deficits and impairments:   Body Structure / Function / Physical Skills: ADL, ROM, Dexterity, Balance, IADL, Body mechanics, Improper spinal/pelvic alignment, Sensation, Mobility, Flexibility, Strength, Coordination, FMC, Tone, UE functional use, Decreased knowledge of use of DME, Proprioception       Visit Diagnosis: Hemiplegia  and hemiparesis following cerebral infarction affecting right dominant side (HCC)  Other lack of coordination  Muscle weakness (generalized)  Other symptoms and signs involving the nervous system    Problem List Patient Active Problem List   Diagnosis Date Noted  . Abnormality of gait 04/28/2020  . Neurogenic bladder 03/17/2020  . Sepsis due to pneumonia ([place]) 03/06/2020  . History of stroke with residual deficit 03/06/2020  . Transient hypotension 03/06/2020  . Spastic hemiplegia affecting nondominant side ([place])   . History of hypertension   . Expressive aphasia   . AKI (acute kidney injury) ([place])   . Global aphasia   . Hypoalbuminemia due to protein-calorie malnutrition ([place])   . Dysphagia, post-stroke   . Hyperlipidemia 02/02/2020  . Dysphagia following cerebral infarction 02/02/2020  . Aspiration pneumonia ([place]) 02/02/2020  . Acute ischemic left MCA stroke ([place]) 02/02/2020  . Acute respiratory failure ([place])   . Acute ischemic stroke [facility]) s/p clot retrieval L MCA & ACA A3 01/27/2020  . Aspiration into airway 01/27/2020  . Chronic anticoagulation 01/27/2020  . Paroxysmal atrial fibrillation ([place]) 06/15/2018  . Essential hypertension 06/15/2018  . NICM (nonischemic cardiomyopathy) ([place]) 06/15/2018  . Visit for monitoring Tikosyn therapy 06/12/2018    Neera Teng 05/20/2020, 9:08 AM  [facility] [address] [place], [place], [address] Phone: [phone]   Fax:  [phone]  Name: Mauricio  SAIFAN RAYFORD MRN: [id number] Date of Birth: May 03, 1956

## 2020-05-24 ENCOUNTER — Other Ambulatory Visit: Payer: Self-pay

## 2020-05-24 ENCOUNTER — Ambulatory Visit: Payer: BC Managed Care – PPO

## 2020-05-24 ENCOUNTER — Ambulatory Visit: Payer: BC Managed Care – PPO | Admitting: Occupational Therapy

## 2020-05-24 DIAGNOSIS — R29818 Other symptoms and signs involving the nervous system: Secondary | ICD-10-CM

## 2020-05-24 DIAGNOSIS — R482 Apraxia: Secondary | ICD-10-CM

## 2020-05-24 DIAGNOSIS — R278 Other lack of coordination: Secondary | ICD-10-CM

## 2020-05-24 DIAGNOSIS — I69351 Hemiplegia and hemiparesis following cerebral infarction affecting right dominant side: Secondary | ICD-10-CM

## 2020-05-24 DIAGNOSIS — R2689 Other abnormalities of gait and mobility: Secondary | ICD-10-CM

## 2020-05-24 DIAGNOSIS — R4701 Aphasia: Secondary | ICD-10-CM

## 2020-05-24 DIAGNOSIS — M6281 Muscle weakness (generalized): Secondary | ICD-10-CM

## 2020-05-24 NOTE — Therapy (Signed)
[facility] [address] [place], [place], [address] Phone: [phone]   Fax:  [phone]  Speech Language Pathology Treatment  Patient Details  Name: Dustin Schneider MRN: [id number] Date of Birth: 1956/06/19 Referring Provider (SLP): Dr. Delice Lesch   Encounter Date: 05/24/2020   End of Session - 05/24/20 1132    Visit Number 17    Number of Visits 25    Date for SLP Re-Evaluation 06/15/20    SLP Start Time 71    SLP Stop Time  1100    SLP Time Calculation (min) 41 min    Activity Tolerance Patient tolerated treatment well           Past Medical History:  Diagnosis Date  . Hypertension   . NICM (nonischemic cardiomyopathy) ([place]) 06/15/2018   04/23/18: EF 45%, 09/15/18: NOrmal LVEF  . Paroxysmal atrial fibrillation ([place]) 06/15/2018  . Stroke ([place])   . Visit for monitoring Tikosyn therapy 06/12/2018    Past Surgical History:  Procedure Laterality Date  . BUBBLE STUDY  03/11/2020   Procedure: BUBBLE STUDY;  Surgeon: [name], MD;  Location: [facility];  Service: Cardiovascular;;  . CARDIOVERSION N/A 05/13/2018   Procedure: CARDIOVERSION;  Surgeon: [name], MD;  Location: [facility] ENDOSCOPY;  Service: Cardiovascular;  Laterality: N/A;  . CARDIOVERSION N/A 06/13/2018   Procedure: CARDIOVERSION;  Surgeon: [name], MD;  Location: [facility] ENDOSCOPY;  Service: Cardiovascular;  Laterality: N/A;  . CARDIOVERSION N/A 06/23/2018   Procedure: CARDIOVERSION;  Surgeon: [name], MD;  Location: [facility] ENDOSCOPY;  Service: Cardiovascular;  Laterality: N/A;  . CARDIOVERSION N/A 03/11/2020   Procedure: CARDIOVERSION;  Surgeon: [name], MD;  Location: [facility];  Service: Cardiovascular;  Laterality: N/A;  . excision of actinic keratosis    . EYE SURGERY     eye lift  . IR CT HEAD LTD  01/27/2020  . IR PERCUTANEOUS ART THROMBECTOMY/INFUSION INTRACRANIAL INC DIAG ANGIO  01/27/2020      . IR PERCUTANEOUS ART  THROMBECTOMY/INFUSION INTRACRANIAL INC DIAG ANGIO  01/27/2020  . RADIOLOGY WITH ANESTHESIA N/A 01/27/2020   Procedure: IR WITH ANESTHESIA;  Surgeon: Radiologist, Medication, MD;  Location: [facility];  Service: Radiology;  Laterality: N/A;  . TEE WITHOUT CARDIOVERSION N/A 03/11/2020   Procedure: TRANSESOPHAGEAL ECHOCARDIOGRAM (TEE);  Surgeon: [name], MD;  Location: [facility];  Service: Cardiovascular;  Laterality: N/A;  . TONSILLECTOMY    . WISDOM TOOTH EXTRACTION      There were no vitals filed for this visit.          ADULT SLP TREATMENT - 05/24/20 1130      General Information   Behavior/Cognition Alert;Cooperative;Pleasant mood      Treatment Provided   Treatment provided Cognitive-Linquistic      Cognitive-Linquistic Treatment   Treatment focused on Aphasia;Apraxia    Skilled Treatment Dustin Schneider attended [place] alone today. Pt added description for another work icon. He req'd min A occasionally for adding picture for [employer] bust. In conversation about details of Dustin Schneider statue, pt req'd SLP lead to use drawing function of [employer]raph[employer] but pt attempted spontaneous use after seeing SLP use. [name] attempts were non-functional however due to dysgraphia. SLP provided financial assistance form for pt to fill out and return.       Assessment / Recommendations / Plan   Plan Continue with current plan of care      Progression Toward Goals   Progression toward goals Progressing toward goals  SLP Short Term Goals - 05/09/20 1132      SLP SHORT TERM GOAL #1   Title Pt will name family, friends and art items with occasional min A 8/10 with approximations allowed.    Time 1    Period Weeks    Status Partially Met      SLP SHORT TERM GOAL #2   Title Pt will name 7 items in personally relevant categories with occasional min A over 2 sessions    Time 1    Period Weeks    Status Partially Met      SLP SHORT TERM GOAL #3   Title Pt will perform 4 automatic speech tasks  with rare min A.    Time 1    Period Weeks    Status Partially Met      SLP SHORT TERM GOAL #4   Title Pt will write personal information (name/address/phone), 5 family/friend/pet names and 7  professional words with occasional min A    Time 1    Period Weeks    Status Not Met      SLP SHORT TERM GOAL #5   Title Pt will use mulitmodal communication (gesture, draw, write 1st letter etc) to augment verbal expression to meet needs at home with rare min A from family.    Time 1    Period Weeks    Status Partially Met            SLP Long Term Goals - 05/24/20 1134      SLP LONG TERM GOAL #1   Title Pt will verbalize 3 words to explain or describe his artwork and hobbies with occasional min A over 3 sessions, allowing for intellgible approximations    Baseline 05-17-20 (Lingraphica cues), 05-24-20 (lingraphica)    Time 4    Period Weeks    Status On-going      SLP LONG TERM GOAL #2   Title Pt and family will utilize multimodal compensations for aphasia and verbal apraxia to participate in 3 turns each of conversation with occasional min A over 2 sessions    Time 4    Period Weeks    Status On-going      SLP LONG TERM GOAL #3   Title Pt will write 3 word phrases with less than 2 errors with rare min A over 3 sessions    Time 4    Period Weeks    Status On-going      SLP LONG TERM GOAL #4   Title Reading assessment if indicated    Time 1    Period Weeks    Status On-going            Plan - 05/24/20 1132    Clinical Impression Statement moderate Broca's aphasia and severe verbal apraxia persist, affecting communication of basic wants, needs and social interactions. Ongoing training in multimodal communication - Dustin Schneider") req'd initial cues for using "draw" feature, but pt cont to require less cuing for procedures for basic programming. Continue skilled ST to maximize communication for QOL, safety and reduce family burden.    Speech Therapy Frequency 2x / week     Duration --   12 weeks or 25 visits   Treatment/Interventions Environmental controls;Cueing hierarchy;SLP instruction and feedback;Compensatory strategies;Functional tasks;Cognitive reorganization;Compensatory techniques;Internal/external aids;Multimodal communcation approach;Patient/family education;Language facilitation    Potential to Achieve Goals Good    Potential Considerations Severity of impairments           Patient will benefit from  skilled therapeutic intervention in order to improve the following deficits and impairments:   Aphasia  Verbal apraxia    Problem List Patient Active Problem List   Diagnosis Date Noted  . Abnormality of gait 04/28/2020  . Neurogenic bladder 03/17/2020  . Sepsis due to pneumonia ([place]) 03/06/2020  . History of stroke with residual deficit 03/06/2020  . Transient hypotension 03/06/2020  . Spastic hemiplegia affecting nondominant side ([place])   . History of hypertension   . Expressive aphasia   . AKI (acute kidney injury) ([place])   . Global aphasia   . Hypoalbuminemia due to protein-calorie malnutrition ([place])   . Dysphagia, post-stroke   . Hyperlipidemia 02/02/2020  . Dysphagia following cerebral infarction 02/02/2020  . Aspiration pneumonia ([place]) 02/02/2020  . Acute ischemic left MCA stroke ([place]) 02/02/2020  . Acute respiratory failure ([place])   . Acute ischemic stroke [facility]) s/p clot retrieval L MCA & ACA A3 01/27/2020  . Aspiration into airway 01/27/2020  . Chronic anticoagulation 01/27/2020  . Paroxysmal atrial fibrillation ([place]) 06/15/2018  . Essential hypertension 06/15/2018  . NICM (nonischemic cardiomyopathy) ([place]) 06/15/2018  . Visit for monitoring Tikosyn therapy 06/12/2018    [facility] ,[place], [place]  05/24/2020, 11:34 AM  [facility] [name] [place], [place], [address] Phone: [phone]   Fax:  [phone]   Name: Dustin Schneider MRN: [id number] Date  of Birth: 20-Jan-1956

## 2020-05-24 NOTE — Therapy (Signed)
[facility] [address] [place], [place], [address] Phone: [phone]   Fax:  [phone]  Occupational Therapy Treatment  Patient Details  Name: Dustin Schneider MRN: [id number] Date of Birth: 02/02/1956 No data recorded  Encounter Date: 05/24/2020   OT End of Session - 05/24/20 0920    Visit Number 18    Number of Visits 25    Date for OT Re-Evaluation 06/23/20    Authorization Type BC/BS - No auth required, covered 100%    OT Start Time 0850    OT Stop Time 0928    OT Time Calculation (min) 38 min    Activity Tolerance Patient tolerated treatment well    Behavior During Therapy [facility] for tasks assessed/performed           Past Medical History:  Diagnosis Date  . Hypertension   . NICM (nonischemic cardiomyopathy) ([place]) 06/15/2018   04/23/18: EF 45%, 09/15/18: NOrmal LVEF  . Paroxysmal atrial fibrillation ([place]) 06/15/2018  . Stroke ([place])   . Visit for monitoring Tikosyn therapy 06/12/2018    Past Surgical History:  Procedure Laterality Date  . BUBBLE STUDY  03/11/2020   Procedure: BUBBLE STUDY;  Surgeon: [name], MD;  Location: [place];  Service: Cardiovascular;;  . CARDIOVERSION N/A 05/13/2018   Procedure: CARDIOVERSION;  Surgeon: [name], MD;  Location: [facility] ENDOSCOPY;  Service: Cardiovascular;  Laterality: N/A;  . CARDIOVERSION N/A 06/13/2018   Procedure: CARDIOVERSION;  Surgeon: [name], MD;  Location: [facility] ENDOSCOPY;  Service: Cardiovascular;  Laterality: N/A;  . CARDIOVERSION N/A 06/23/2018   Procedure: CARDIOVERSION;  Surgeon: [name], MD;  Location: [facility] ENDOSCOPY;  Service: Cardiovascular;  Laterality: N/A;  . CARDIOVERSION N/A 03/11/2020   Procedure: CARDIOVERSION;  Surgeon: [name], MD;  Location: [place];  Service: Cardiovascular;  Laterality: N/A;  . excision of actinic keratosis    . EYE SURGERY     eye lift  . IR CT HEAD LTD  01/27/2020  . IR PERCUTANEOUS ART  THROMBECTOMY/INFUSION INTRACRANIAL INC DIAG ANGIO  01/27/2020      . IR PERCUTANEOUS ART THROMBECTOMY/INFUSION INTRACRANIAL INC DIAG ANGIO  01/27/2020  . RADIOLOGY WITH ANESTHESIA N/A 01/27/2020   Procedure: IR WITH ANESTHESIA;  Surgeon: Radiologist, Medication, MD;  Location: [place];  Service: Radiology;  Laterality: N/A;  . TEE WITHOUT CARDIOVERSION N/A 03/11/2020   Procedure: TRANSESOPHAGEAL ECHOCARDIOGRAM (TEE);  Surgeon: [name], MD;  Location: [place];  Service: Cardiovascular;  Laterality: N/A;  . TONSILLECTOMY    . WISDOM TOOTH EXTRACTION      There were no vitals filed for this visit.   Subjective Assessment - 05/24/20 0919    Pertinent History Lt MCA CVA 01/27/20, readmitted to hospital with HAP and fever on 03/06/20 and released 03/11/20. PMH: paroxsymal A-fib, HTN    Limitations fall    Currently in Pain? Yes    Pain Score --   mild, unable to rate   Pain Location Wrist    Pain Orientation Right    Pain Descriptors / Indicators Aching;Tightness    Pain Type Acute pain    Pain Onset 1 to 4 weeks ago    Pain Frequency Intermittent    Aggravating Factors  malpositioning    Pain Relieving Factors stretching                Treatment: [name] air cast was applied to ankle for improved positioning and safety with transfers, minguard with transfer squat pivot. Gentle mobs to right  wrist with distraction and passive ROM with improved A/ROM afterwards Mid range shoulder flexion with UE ranger, then horizontal abduction, min facilitation/ v.c Mid range closed chain shoulder flexion with foam roll, min facilitation Arm bike x 6 mins level 1 for conditioning, with RUE wrapped, min v.c for speed. Pt required v.c during transfers today due to impulsivity and standing prior to RLE being positioned correctly. Standing with chair in front of pt weightbearing through arms of chair with bilateral UE's with lateral and anterior/ posterior weight shifts.                  OT Short Term Goals - 05/05/20 1125      OT SHORT TERM GOAL #1   Title Independent with initial HEP    Time 4    Period Weeks    Status Achieved      OT SHORT TERM GOAL #2   Title Pt to perform low to mid level reaching of light objects RUE in prep for feeding self and bathing    Time 4    Period Weeks    Status Achieved   04/20/20     OT SHORT TERM GOAL #3   Title Pt to feed self 50% of the time with Rt dominant hand and A/E prn    Time 4    Period Weeks    Status On-going   04/20/20: able to eat finger foods     OT SHORT TERM GOAL #4   Title Pt to perform UE dressing of pullover shirt mod I level    Time 4    Period Weeks    Status Achieved   met per pt and brother     OT SHORT TERM GOAL #5   Title Pt to perform LE dressing and all bathing with no more than mod assist using A/E and strategies prn    Time 4    Period Weeks    Status Achieved   04/20/20:  set-up for bedside bath, mod A for LE dressing     OT SHORT TERM GOAL #6   Title Pt to perform toileting and toilet transfers with no more than min assist overall    Time 4    Period Weeks    Status On-going      OT SHORT TERM GOAL #7   Title Improve functional use of RUE as evidenced by performing 8 blocks on Box & Blocks test    Baseline 0    Time 4    Period Weeks    Status Achieved   04/20/20:  6 blocks, 05/05/20: 12 blocks            OT Long Term Goals - 05/05/20 1126      OT LONG TERM GOAL #1   Title Pt to be independent with updated HEP    Time 12    Period Weeks    Status New      OT LONG TERM GOAL #2   Title Pt to perform all BADLS at mod I level    Time 12    Period Weeks    Status On-going      OT LONG TERM GOAL #3   Title Pt to feed self 75% or greater with Rt dominant hand and grooming 50% or more with Rt hand    Time 12    Period Weeks    Status New      OT LONG TERM GOAL #4   Title Pt to  retrieve light weight objects from high shelf RUE consistently    Time 12    Period Weeks    Status  New      OT LONG TERM GOAL #5   Title Pt to demo 25 lbs grip strength or more Rt dominant hand for gripping, opening jars/containers    Time 12    Period Weeks    Status On-going   12.5 lbs     OT LONG TERM GOAL #6   Title Pt to perform simple meal prep and light cleaning tasks mod I level    Time 12    Period Weeks    Status New      OT LONG TERM GOAL #7   Title Improve functional use RUE as evidenced by performing 25 blocks on Box & Blocks and performing 9 hole peg test in 90 sec or under    Time 12    Period Weeks    Status On-going   12 blocks     OT LONG TERM GOAL #8   Title Pt to returned to modified art/sculpting tasks with task modifications and A/E prn    Time 12    Period Weeks    Status New                  Patient will benefit from skilled therapeutic intervention in order to improve the following deficits and impairments:           Visit Diagnosis: Hemiplegia and hemiparesis following cerebral infarction affecting right dominant side (HCC)  Other lack of coordination  Muscle weakness (generalized)  Other symptoms and signs involving the nervous system    Problem List Patient Active Problem List   Diagnosis Date Noted  . Abnormality of gait 04/28/2020  . Neurogenic bladder 03/17/2020  . Sepsis due to pneumonia ([place]) 03/06/2020  . History of stroke with residual deficit 03/06/2020  . Transient hypotension 03/06/2020  . Spastic hemiplegia affecting nondominant side ([place])   . History of hypertension   . Expressive aphasia   . AKI (acute kidney injury) ([place])   . Global aphasia   . Hypoalbuminemia due to protein-calorie malnutrition ([place])   . Dysphagia, post-stroke   . Hyperlipidemia 02/02/2020  . Dysphagia following cerebral infarction 02/02/2020  . Aspiration pneumonia ([place]) 02/02/2020  . Acute ischemic left MCA stroke ([place]) 02/02/2020  . Acute respiratory failure ([place])   . Acute ischemic stroke [facility]) s/p clot retrieval L MCA & ACA A3  01/27/2020  . Aspiration into airway 01/27/2020  . Chronic anticoagulation 01/27/2020  . Paroxysmal atrial fibrillation ([place]) 06/15/2018  . Essential hypertension 06/15/2018  . NICM (nonischemic cardiomyopathy) ([place]) 06/15/2018  . Visit for monitoring Tikosyn therapy 06/12/2018    Jacklyn Branan 05/24/2020, 9:22 AM  [facility] [address] [place], [place], [address] Phone: [phone]   Fax:  [phone]  Name: Dustin Schneider MRN: [id number] Date of Birth: Jun 12, 1956

## 2020-05-24 NOTE — Therapy (Signed)
[facility] [address] [place], [place], [address] Phone: [phone]   Fax:  [phone]  Physical Therapy Treatment  Patient Details  Name: Dustin Schneider MRN: [id number] Date of Birth: [birth year] Referring Provider (PT): Lauraine Rinne, PA-C (will be followed by Dr. Posey Pronto)   Encounter Date: 05/24/2020   PT End of Session - 05/24/20 0936    Visit Number 21    Number of Visits 25    Date for PT Re-Evaluation 06/14/20    Authorization Type BCBS    PT Start Time 0933    PT Stop Time 1014    PT Time Calculation (min) 41 min    Equipment Utilized During Treatment Gait belt    Activity Tolerance Patient tolerated treatment well    Behavior During Therapy [facility] for tasks assessed/performed           Past Medical History:  Diagnosis Date  . Hypertension   . NICM (nonischemic cardiomyopathy) ([place]) 06/15/2018   04/23/18: EF 45%, 09/15/18: NOrmal LVEF  . Paroxysmal atrial fibrillation ([place]) 06/15/2018  . Stroke ([place])   . Visit for monitoring Tikosyn therapy 06/12/2018    Past Surgical History:  Procedure Laterality Date  . BUBBLE STUDY  03/11/2020   Procedure: BUBBLE STUDY;  Surgeon: [name], MD;  Location: [place];  Service: Cardiovascular;;  . CARDIOVERSION N/A 05/13/2018   Procedure: CARDIOVERSION;  Surgeon: [name], MD;  Location: [facility] ENDOSCOPY;  Service: Cardiovascular;  Laterality: N/A;  . CARDIOVERSION N/A 06/13/2018   Procedure: CARDIOVERSION;  Surgeon: [name], MD;  Location: [facility] ENDOSCOPY;  Service: Cardiovascular;  Laterality: N/A;  . CARDIOVERSION N/A 06/23/2018   Procedure: CARDIOVERSION;  Surgeon: [name], MD;  Location: [facility] ENDOSCOPY;  Service: Cardiovascular;  Laterality: N/A;  . CARDIOVERSION N/A 03/11/2020   Procedure: CARDIOVERSION;  Surgeon: [name], MD;  Location: [place];  Service: Cardiovascular;  Laterality: N/A;  . excision of actinic keratosis    . EYE SURGERY      eye lift  . IR CT HEAD LTD  01/27/2020  . IR PERCUTANEOUS ART THROMBECTOMY/INFUSION INTRACRANIAL INC DIAG ANGIO  01/27/2020      . IR PERCUTANEOUS ART THROMBECTOMY/INFUSION INTRACRANIAL INC DIAG ANGIO  01/27/2020  . RADIOLOGY WITH ANESTHESIA N/A 01/27/2020   Procedure: IR WITH ANESTHESIA;  Surgeon: Radiologist, Medication, MD;  Location: [place];  Service: Radiology;  Laterality: N/A;  . TEE WITHOUT CARDIOVERSION N/A 03/11/2020   Procedure: TRANSESOPHAGEAL ECHOCARDIOGRAM (TEE);  Surgeon: [name], MD;  Location: [place];  Service: Cardiovascular;  Laterality: N/A;  . TONSILLECTOMY    . WISDOM TOOTH EXTRACTION      There were no vitals filed for this visit.   Subjective Assessment - 05/24/20 0936    Subjective Pt reports he is doing well. No falls. Asking about when he should wear air caste.    Patient is accompained by: Family member   sister   Pertinent History PMH: HTN, paroxysmal a fib    Limitations Standing;Walking    Patient Stated Goals "wants everything working on R side again"    Currently in Pain? Yes    Pain Score 0-No pain    Pain Location Wrist    Pain Orientation Right    Pain Type Acute pain                             [facility] Adult PT Treatment/Exercise - 05/24/20 8366  Transfers   Transfers Sit to Stand;Stand to Sit;Squat Pivot Transfers    Sit to Stand 4: Min guard    Stand to Sit 4: Min guard    Squat Pivot Transfers 4: Min guard    Squat Pivot Transfer Details (indicate cue type and reason) mat to w/c      Ambulation/Gait   Ambulation/Gait Yes    Ambulation/Gait Assistance 4: Min assist    Ambulation/Gait Assistance Details Pt was cued to try to focus on right foot placement. Pt provided minimal assist to help with right foot plaement at times as does ER with steps. Pt cued to slow down.    Ambulation Distance (Feet) 230 Feet   230' x 1 after seated rest break   Assistive device Rolling walker   right hand grip attachment, right AFO  with T strap   Gait Pattern Step-through pattern;Decreased step length - right;Decreased step length - left;Decreased hip/knee flexion - right    Ambulation Surface Level;Indoor      Neuro Re-ed    Neuro Re-ed Details  Tall kneeling on mat min/mod assist to get in to position with rolling over from sitting with PT assisting with RLE placement with hands on to blue bench: working on upright posture with PT assisting to faciliate right LE weight shift then adding in right hip bumps to PT x 8 with PT helping to facilitate weight shift at pelvis, partial squats x 10 with PT facilitating shift to right, reaching for targets in front x 10 with using both arms, trying to bring right leg forward and back x 5 with pillow case under leg min assist with minimal motion then performed forward and back with LLE x 8. Min assist throughout with activities.      Exercises   Exercises Other Exercises    Other Exercises  Seated edge of mat: hip adduction x 10                  PT Education - 05/24/20 1405    Education Details PT advised pt to wear air caste whenever up and going to be transferrring more.    Person(s) Educated Patient;Other (comment)   [place]y   Methods Explanation    Comprehension Verbalized understanding            PT Short Term Goals - 05/17/20 2019      PT SHORT TERM GOAL #1   Title Pt will perform sit <> stand with RW with supervision in order to demo decr caregiver burden and improved functional BLE strength/balance. ALL STGS DUE 05/18/20    Baseline 05/17/20 supervision/CGA    Time 4    Period Weeks    Status Partially Met    Target Date 05/18/20      PT SHORT TERM GOAL #2   Title Pt will perform sit > supine with supervision and supine > sit with min guard for bed mobility in order to decr caregiver burden.    Baseline 04/15/20 supervision sit to supine, min assist supine to sit as did not get right leg off mat, 05/17/20 supervision    Time 4    Period Weeks    Status  Achieved      PT SHORT TERM GOAL #3   Title Pt will undergo assessment of AFO for functional transfers and gait.    Baseline Orthotist has come and determined custom AFO need.    Time 4    Period Weeks    Status Achieved  PT SHORT TERM GOAL #4   Title Pt will ambulate 83' with RW with mod A x1 in order to improve functional mobility.    Baseline 04/15/20 Mod assist with 2nd person CGA 230' with RW. Ability does vary. 05/17/20 230' min assist with RW and right AFO    Time 4    Period Weeks    Status Achieved      PT SHORT TERM GOAL #6   Title Pt will perform squat pivot transfers from w/c <> mat table with supervision.    Baseline CGA w/c to/from mat 04/15/20    Time 4    Period Weeks    Status Revised             PT Long Term Goals - 05/17/20 2023      PT LONG TERM GOAL #1   Title Pt and pt's family members will be independent with final HEP in order to build upon functional gains made in therapy. ALL LTGS DUE 06/08/20    Time 12    Period Weeks    Status New      PT LONG TERM GOAL #2   Title Pt will ambulate 115' with min guard/min A with RW and R AFO in order to improve household mobility.    Time 12    Period Weeks    Status New      PT LONG TERM GOAL #3   Title Pt will perform sit <> stand with RW with min guard in order to improve functional BLE strength for transfers.    Time 12    Period Weeks    Status New      PT LONG TERM GOAL #4   Title Pt will perform squat pivot transfer with mod I vs. stand pivot transfer with RW with min guard in order to decr caregiver burden.    Time 12    Period Weeks    Status New      PT LONG TERM GOAL #5   Title Pt will decrease TUG from 29.5 sec to <25 sec with RW for improved balance and decreased fall risk.    Baseline 05/17/20 29.5 sec with RW    Time 4    Period Weeks    Status New    Target Date 06/08/20                 Plan - 05/24/20 1406    Clinical Impression Statement PT continued to focus on gait  training with pt needing less assistance to advance RLE but does continue to externally rotate. Pt needed cuing to turn fully with going to sit using walker.    Personal Factors and Comorbidities Comorbidity 3+    Comorbidities dense L MCA CVA, paroxysmal A. fib on chronic Xarelto, nonischemic cardiomyopathy and hypertension.    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, works as an Electrical engineer Evolving/Moderate complexity    Rehab Potential Good    PT Frequency 2x / week    PT Duration 12 weeks    PT Treatment/Interventions ADLs/Self Care Home Management;Aquatic Therapy;DME Instruction;Gait training;Stair training;Functional mobility training;Therapeutic activities;Therapeutic exercise;Electrical Stimulation;Balance training;Neuromuscular re-education;Wheelchair mobility training;Orthotic Fit/Training;Patient/family education;Passive range of motion;Energy conservation    PT Next Visit Plan continue with tall kneeling. attempt quadruped again on a day not doing tall kneeling as pt fatigues with both; hip ABD/quad strengthening, weight shifting/NMR to RLE. continue gait  training with brace, t-strap and hand orthotic, bioness to RLE (ant tib and quad) for strengthening/muscle re-ed    Consulted and Agree with Plan of Care Patient;Family member/caregiver           Patient will benefit from skilled therapeutic intervention in order to improve the following deficits and impairments:  Abnormal gait, Decreased activity tolerance, Decreased balance, Decreased cognition, Decreased mobility, Decreased coordination, Decreased range of motion, Decreased endurance, Decreased strength, Hypomobility, Difficulty walking, Impaired UE functional use, Impaired vision/preception, Postural  dysfunction, Impaired sensation  Visit Diagnosis: Other abnormalities of gait and mobility  Muscle weakness (generalized)  Hemiplegia and hemiparesis following cerebral infarction affecting right dominant side [facility])     Problem List Patient Active Problem List   Diagnosis Date Noted  . Abnormality of gait 04/28/2020  . Neurogenic bladder 03/17/2020  . Sepsis due to pneumonia ([place]) 03/06/2020  . History of stroke with residual deficit 03/06/2020  . Transient hypotension 03/06/2020  . Spastic hemiplegia affecting nondominant side ([place])   . History of hypertension   . Expressive aphasia   . AKI (acute kidney injury) ([place])   . Global aphasia   . Hypoalbuminemia due to protein-calorie malnutrition ([place])   . Dysphagia, post-stroke   . Hyperlipidemia 02/02/2020  . Dysphagia following cerebral infarction 02/02/2020  . Aspiration pneumonia ([place]) 02/02/2020  . Acute ischemic left MCA stroke ([place]) 02/02/2020  . Acute respiratory failure ([place])   . Acute ischemic stroke [facility]) s/p clot retrieval L MCA & ACA A3 01/27/2020  . Aspiration into airway 01/27/2020  . Chronic anticoagulation 01/27/2020  . Paroxysmal atrial fibrillation ([place]) 06/15/2018  . Essential hypertension 06/15/2018  . NICM (nonischemic cardiomyopathy) ([place]) 06/15/2018  . Visit for monitoring Tikosyn therapy 06/12/2018    Electa Sniff, PT, DPT, NCS 05/24/2020, 3:58 PM  [facility] [address] [place], [place], [address] Phone: [phone]   Fax:  [phone]  Name: Dustin Schneider MRN: [id number] Date of Birth: 04/28/1956

## 2020-05-24 NOTE — Patient Instructions (Signed)
° °  We heard from [facility] that [employer] out of pocket would be >$4000 for the device since they are out of network.  Not to fear! I have attached a financial assistance application. Please fill out ASAP and return so I can upload it to the [employer].

## 2020-05-27 ENCOUNTER — Ambulatory Visit: Payer: BC Managed Care – PPO

## 2020-05-27 ENCOUNTER — Other Ambulatory Visit: Payer: Self-pay

## 2020-05-27 ENCOUNTER — Ambulatory Visit: Payer: BC Managed Care – PPO | Admitting: Occupational Therapy

## 2020-05-27 ENCOUNTER — Encounter: Payer: Self-pay | Admitting: Occupational Therapy

## 2020-05-27 ENCOUNTER — Ambulatory Visit: Payer: BC Managed Care – PPO | Admitting: Physical Therapy

## 2020-05-27 DIAGNOSIS — R4701 Aphasia: Secondary | ICD-10-CM

## 2020-05-27 DIAGNOSIS — R278 Other lack of coordination: Secondary | ICD-10-CM

## 2020-05-27 DIAGNOSIS — M6281 Muscle weakness (generalized): Secondary | ICD-10-CM

## 2020-05-27 DIAGNOSIS — I69351 Hemiplegia and hemiparesis following cerebral infarction affecting right dominant side: Secondary | ICD-10-CM

## 2020-05-27 DIAGNOSIS — R29818 Other symptoms and signs involving the nervous system: Secondary | ICD-10-CM

## 2020-05-27 DIAGNOSIS — R482 Apraxia: Secondary | ICD-10-CM

## 2020-05-27 DIAGNOSIS — R2689 Other abnormalities of gait and mobility: Secondary | ICD-10-CM

## 2020-05-27 NOTE — Therapy (Signed)
[facility] [address] [place], [place], [address] Phone: [phone]   Fax:  [phone]  Occupational Therapy Treatment  Patient Details  Name: Dustin Schneider MRN: [id number] Date of Birth: 05/17/1956 No data recorded  Encounter Date: 05/27/2020   OT End of Session - 05/27/20 1330    Visit Number 19    Number of Visits 25    Date for OT Re-Evaluation 06/23/20    Authorization Type BC/BS - No auth required, covered 100%    OT Start Time 0935    OT Stop Time 1015    OT Time Calculation (min) 40 min    Activity Tolerance Patient tolerated treatment well    Behavior During Therapy WFL for tasks assessed/performed           Past Medical History:  Diagnosis Date  . Hypertension   . NICM (nonischemic cardiomyopathy) (HCC) 06/15/2018   04/23/18: EF 45%, 09/15/18: NOrmal LVEF  . Paroxysmal atrial fibrillation (HCC) 06/15/2018  . Stroke (HCC)   . Visit for monitoring Tikosyn therapy 06/12/2018    Past Surgical History:  Procedure Laterality Date  . BUBBLE STUDY  03/11/2020   Procedure: BUBBLE STUDY;  Surgeon: Ganji, Jay, MD;  Location: [facility];  Service: Cardiovascular;;  . CARDIOVERSION N/A 05/13/2018   Procedure: CARDIOVERSION;  Surgeon: Ganji, Jay, MD;  [facility]: [facility];  Service: Cardiovascular;  Laterality: N/A;  . CARDIOVERSION N/A 06/13/2018   Procedure: CARDIOVERSION;  Surgeon: Nishan, Peter C, MD;  [facility]: [facility];  Service: Cardiovascular;  Laterality: N/A;  . CARDIOVERSION N/A 06/23/2018   Procedure: CARDIOVERSION;  Surgeon: Ganji, Jay, MD;  [facility];  Service: Cardiovascular;  Laterality: N/A;  . CARDIOVERSION N/A 03/11/2020   Procedure: CARDIOVERSION;  Surgeon: Ganji, Jay, MD;  [facility];  Service: Cardiovascular;  Laterality: N/A;  . excision of actinic keratosis    . EYE SURGERY     eye lift  . IR CT HEAD LTD  01/27/2020  . IR PERCUTANEOUS ART  THROMBECTOMY/INFUSION INTRACRANIAL INC DIAG ANGIO  01/27/2020      . IR PERCUTANEOUS ART THROMBECTOMY/INFUSION INTRACRANIAL INC DIAG ANGIO  01/27/2020  . RADIOLOGY WITH ANESTHESIA N/A 01/27/2020   Procedure: IR WITH ANESTHESIA;  Surgeon: Radiologist, Medication, MD;  [facility]: [facility];  Service: Radiology;  Laterality: N/A;  . TEE WITHOUT CARDIOVERSION N/A 03/11/2020   Procedure: TRANSESOPHAGEAL ECHOCARDIOGRAM (TEE);  Surgeon: Ganji, Jay, MD;  [facility];  Service: Cardiovascular;  Laterality: N/A;  . TONSILLECTOMY    . WISDOM TOOTH EXTRACTION      There were no vitals filed for this visit.   Subjective Assessment - 05/27/20 0936    Pertinent History Lt MCA CVA 01/27/20, readmitted to hospital with HAP and fever on 03/06/20 and released 03/11/20. PMH: paroxsymal A-fib, HTN    Limitations fall    Currently in Pain? Yes    Pain Score 3     Pain Location Wrist    Pain Orientation Right    Pain Descriptors / Indicators Aching    Pain Type Acute pain    Pain Onset 1 to 4 weeks ago    Pain Frequency Intermittent    Aggravating Factors  malpositioning    Pain Relieving Factors stretching                    Treatment: Gentle mobs to right wrist with distraction and passive ROM with improved A/ROM afterwards Quadraped for tall kneeling with ball in front rolling   ball forward and backwards , attempted weightbearing over ball through bilateral UE's on yoga blocks in quadraped, due to right wrist limitations this was discontinued.  Mid range closed chain shoulder flexion with foam roll, min facilitation Arm bike x 6 mins level 1 for conditioning, with RUE wrapped, min v.c for speed.. Standing with chair in front of pt weightbearing through arms of chair with bilateral UE's with lateral and then through back of chair for anterior/ posterior weight shifts.               OT Short Term Goals - 05/05/20 1125      OT SHORT TERM GOAL #1   Title Independent with initial HEP     Time 4    Period Weeks    Status Achieved      OT SHORT TERM GOAL #2   Title Pt to perform low to mid level reaching of light objects RUE in prep for feeding self and bathing    Time 4    Period Weeks    Status Achieved   04/20/20     OT SHORT TERM GOAL #3   Title Pt to feed self 50% of the time with Rt dominant hand and A/E prn    Time 4    Period Weeks    Status On-going   04/20/20: able to eat finger foods     OT SHORT TERM GOAL #4   Title Pt to perform UE dressing of pullover shirt mod I level    Time 4    Period Weeks    Status Achieved   met per pt and brother     OT SHORT TERM GOAL #5   Title Pt to perform LE dressing and all bathing with no more than mod assist using A/E and strategies prn    Time 4    Period Weeks    Status Achieved   04/20/20:  set-up for bedside bath, mod A for LE dressing     OT SHORT TERM GOAL #6   Title Pt to perform toileting and toilet transfers with no more than min assist overall    Time 4    Period Weeks    Status On-going      OT SHORT TERM GOAL #7   Title Improve functional use of RUE as evidenced by performing 8 blocks on Box & Blocks test    Baseline 0    Time 4    Period Weeks    Status Achieved   04/20/20:  6 blocks, 05/05/20: 12 blocks            OT Long Term Goals - 05/05/20 1126      OT LONG TERM GOAL #1   Title Pt to be independent with updated HEP    Time 12    Period Weeks    Status New      OT LONG TERM GOAL #2   Title Pt to perform all BADLS at mod I level    Time 12    Period Weeks    Status On-going      OT LONG TERM GOAL #3   Title Pt to feed self 75% or greater with Rt dominant hand and grooming 50% or more with Rt hand    Time 12    Period Weeks    Status New      OT LONG TERM GOAL #4   Title Pt to retrieve light weight objects from high shelf RUE consistently  Time 12    Period Weeks    Status New      OT LONG TERM GOAL #5   Title Pt to demo 25 lbs grip strength or more Rt dominant hand for  gripping, opening jars/containers    Time 12    Period Weeks    Status On-going   12.5 lbs     OT LONG TERM GOAL #6   Title Pt to perform simple meal prep and light cleaning tasks mod I level    Time 12    Period Weeks    Status New      OT LONG TERM GOAL #7   Title Improve functional use RUE as evidenced by performing 25 blocks on Box & Blocks and performing 9 hole peg test in 90 sec or under    Time 12    Period Weeks    Status On-going   12 blocks     OT LONG TERM GOAL #8   Title Pt to returned to modified art/sculpting tasks with task modifications and A/E prn    Time 12    Period Weeks    Status New                 Plan - 05/27/20 1330    Clinical Impression Statement Pt is progressing towards goals however he is limited by right wrist pain and stiffness.    Occupational performance deficits (Please refer to evaluation for details): ADL's;IADL's;Work;Leisure;Social Participation    Body Structure / Function / Physical Skills ADL;ROM;Dexterity;Balance;IADL;Body mechanics;Improper spinal/pelvic alignment;Sensation;Mobility;Flexibility;Strength;Coordination;FMC;Tone;UE functional use;Decreased knowledge of use of DME;Proprioception    Rehab Potential Good    OT Frequency 2x / week    OT Duration 12 weeks    OT Treatment/Interventions Self-care/ADL training;Therapeutic exercise;Functional Mobility Training;Neuromuscular education;Manual Therapy;Splinting;Aquatic Therapy;Manual lymph drainage;Therapeutic activities;Coping strategies training;DME and/or AE instruction;Electrical Stimulation;Fluidtherapy;Passive range of motion;Patient/family education    Plan NMR,, pt may benefit from botox for Rt wrist and forearm    Consulted and Agree with Plan of Care Patient           Patient will benefit from skilled therapeutic intervention in order to improve the following deficits and impairments:   Body Structure / Function / Physical Skills: ADL, ROM, Dexterity, Balance,  IADL, Body mechanics, Improper spinal/pelvic alignment, Sensation, Mobility, Flexibility, Strength, Coordination, FMC, Tone, UE functional use, Decreased knowledge of use of DME, Proprioception       Visit Diagnosis: Hemiplegia and hemiparesis following cerebral infarction affecting right dominant side (HCC)  Other lack of coordination  Muscle weakness (generalized)  Other symptoms and signs involving the nervous system  Other abnormalities of gait and mobility    Problem List Patient Active Problem List   Diagnosis Date Noted  . Abnormality of gait 04/28/2020  . Neurogenic bladder 03/17/2020  . Sepsis due to pneumonia ([place]) 03/06/2020  . History of stroke with residual deficit 03/06/2020  . Transient hypotension 03/06/2020  . Spastic hemiplegia affecting nondominant side ([place])   . History of hypertension   . Expressive aphasia   . AKI (acute kidney injury) ([place])   . Global aphasia   . Hypoalbuminemia due to protein-calorie malnutrition ([place])   . Dysphagia, post-stroke   . Hyperlipidemia 02/02/2020  . Dysphagia following cerebral infarction 02/02/2020  . Aspiration pneumonia ([place]) 02/02/2020  . Acute ischemic left MCA stroke ([place]) 02/02/2020  . Acute respiratory failure ([place])   . Acute ischemic stroke [facility]) s/p clot retrieval L MCA & ACA A3 01/27/2020  . Aspiration into  airway 01/27/2020  . Chronic anticoagulation 01/27/2020  . Paroxysmal atrial fibrillation ([place]) 06/15/2018  . Essential hypertension 06/15/2018  . NICM (nonischemic cardiomyopathy) ([place]) 06/15/2018  . Visit for monitoring Tikosyn therapy 06/12/2018    Autie Vasudevan 05/27/2020, 1:32 PM  [facility] [address] [place], [place], [address] Phone: [phone]   Fax:  [phone]  Name: ARDIS FULLWOOD MRN: [id number] Date of Birth: Dec 26, 1955

## 2020-05-27 NOTE — Patient Instructions (Signed)
Access Code: [email or web address] URL: [email or web address] Date: 05/27/2020 Prepared by: [name]  Exercises Supine Hip and Knee Flexion PROM with Caregiver - 2 x daily - 5 x weekly - 2 sets - 10 reps Supine Bridge - 1 x daily - 5 x weekly - 2 sets - 10 reps Supine Knee Extension Strengthening - 1 x daily - 5 x weekly - 2 sets - 10 reps Supine Hip Internal and External Rotation - 2 x daily - 7 x weekly - 2 sets - 10 reps Supine Hip Adductor Stretch with Caregiver - 1 x daily - 7 x weekly - 3 sets - 30 hold Seated Hip Adduction Isometrics with Ball - 3 x daily - 7 x weekly - 2 sets - 10 reps - 5 hold Hooklying Single Leg Bent Knee Fallouts with Resistance - 1 x daily - 7 x weekly - 2 sets - 10 reps

## 2020-05-27 NOTE — Patient Instructions (Signed)
°  Generate 2 other art pieces to talk about and 1-2 things to say about each one to put them on your Lingraphica.

## 2020-05-27 NOTE — Therapy (Signed)
[facility] [address] [place], [place], [address] Phone: [phone]   Fax:  [phone]  Speech Language Pathology Treatment  Patient Details  Name: Dustin Schneider MRN: [id number] Date of Birth: May 02, 1956 Referring Provider (SLP): Dr. Delice Lesch   Encounter Date: 05/27/2020   End of Session - 05/27/20 1259    Visit Number 18    Number of Visits 25    Date for SLP Re-Evaluation 06/15/20    SLP Start Time 81    SLP Stop Time  1103    SLP Time Calculation (min) 43 min    Activity Tolerance Patient tolerated treatment well           Past Medical History:  Diagnosis Date  . Hypertension   . NICM (nonischemic cardiomyopathy) ([place]) 06/15/2018   04/23/18: EF 45%, 09/15/18: NOrmal LVEF  . Paroxysmal atrial fibrillation ([place]) 06/15/2018  . Stroke ([place])   . Visit for monitoring Tikosyn therapy 06/12/2018    Past Surgical History:  Procedure Laterality Date  . BUBBLE STUDY  03/11/2020   Procedure: BUBBLE STUDY;  Surgeon: [name], MD;  Location: [facility];  Service: Cardiovascular;;  . CARDIOVERSION N/A 05/13/2018   Procedure: CARDIOVERSION;  Surgeon: [name], MD;  Location: [facility] ENDOSCOPY;  Service: Cardiovascular;  Laterality: N/A;  . CARDIOVERSION N/A 06/13/2018   Procedure: CARDIOVERSION;  Surgeon: [name], MD;  Location: [facility] ENDOSCOPY;  Service: Cardiovascular;  Laterality: N/A;  . CARDIOVERSION N/A 06/23/2018   Procedure: CARDIOVERSION;  Surgeon: [name], MD;  Location: [facility] ENDOSCOPY;  Service: Cardiovascular;  Laterality: N/A;  . CARDIOVERSION N/A 03/11/2020   Procedure: CARDIOVERSION;  Surgeon: [name], MD;  Location: [facility];  Service: Cardiovascular;  Laterality: N/A;  . excision of actinic keratosis    . EYE SURGERY     eye lift  . IR CT HEAD LTD  01/27/2020  . IR PERCUTANEOUS ART THROMBECTOMY/INFUSION INTRACRANIAL INC DIAG ANGIO  01/27/2020      . IR PERCUTANEOUS ART  THROMBECTOMY/INFUSION INTRACRANIAL INC DIAG ANGIO  01/27/2020  . RADIOLOGY WITH ANESTHESIA N/A 01/27/2020   Procedure: IR WITH ANESTHESIA;  Surgeon: Radiologist, Medication, MD;  Location: [facility];  Service: Radiology;  Laterality: N/A;  . TEE WITHOUT CARDIOVERSION N/A 03/11/2020   Procedure: TRANSESOPHAGEAL ECHOCARDIOGRAM (TEE);  Surgeon: [name], MD;  Location: [facility];  Service: Cardiovascular;  Laterality: N/A;  . TONSILLECTOMY    . WISDOM TOOTH EXTRACTION      There were no vitals filed for this visit.          ADULT SLP TREATMENT - 05/27/20 1251      General Information   Behavior/Cognition Alert;Cooperative;Pleasant mood      Treatment Provided   Treatment provided Cognitive-Linquistic      Cognitive-Linquistic Treatment   Treatment focused on Apraxia;Aphasia    Skilled Treatment Pt indicated he has not used device more than a couple times since last session. In order to incr use at home, SLP asked pt what he would like to talk about and with questioning cues SLP ascertained pt would like to talk about art. SLP guided pt to make "art" tab with rare min A to initiate icon generation, min-mod A for procedure and total A for typing.  On second icon pt req'd min A for procedure to make new icon. With extra time and min-mod assistance from [place] pt explained 3 pieces of art he would like to have comments for. Dustin Schneider was told by SLP to tell  his aides about these pieces of art.       Assessment / Recommendations / Plan   Plan Continue with current plan of care            SLP Education - 05/27/20 1259    Education Details Basic icon generation    Person(s) Educated Patient;Caregiver(s)    Methods Explanation;Demonstration;Verbal cues    Comprehension Verbalized understanding;Verbal cues required;Need further instruction            SLP Short Term Goals - 05/09/20 1132      SLP SHORT TERM GOAL #1   Title Pt will name family, friends and art items with occasional min A  8/10 with approximations allowed.    Time 1    Period Weeks    Status Partially Met      SLP SHORT TERM GOAL #2   Title Pt will name 7 items in personally relevant categories with occasional min A over 2 sessions    Time 1    Period Weeks    Status Partially Met      SLP SHORT TERM GOAL #3   Title Pt will perform 4 automatic speech tasks with rare min A.    Time 1    Period Weeks    Status Partially Met      SLP SHORT TERM GOAL #4   Title Pt will write personal information (name/address/phone), 5 family/friend/pet names and 7  professional words with occasional min A    Time 1    Period Weeks    Status Not Met      SLP SHORT TERM GOAL #5   Title Pt will use mulitmodal communication (gesture, draw, write 1st letter etc) to augment verbal expression to meet needs at home with rare min A from family.    Time 1    Period Weeks    Status Partially Met            SLP Long Term Goals - 05/27/20 1301      SLP LONG TERM GOAL #1   Title Pt will verbalize 3 words to explain or describe his artwork and hobbies with occasional min A over 3 sessions, allowing for intellgible approximations    Baseline 05-17-20 (Lingraphica cues), 05-24-20 (lingraphica)    Time 4    Period Weeks    Status On-going      SLP LONG TERM GOAL #2   Title Pt and family will utilize multimodal compensations for aphasia and verbal apraxia to participate in 3 turns each of conversation with occasional min A over 2 sessions    Time 4    Period Weeks    Status On-going      SLP LONG TERM GOAL #3   Title Pt will write 3 word phrases with less than 2 errors with rare min A over 3 sessions    Time 4    Period Weeks    Status On-going      SLP LONG TERM GOAL #4   Title Reading assessment if indicated    Time 1    Period Weeks    Status On-going            Plan - 05/27/20 1300    Clinical Impression Statement moderate Broca's aphasia and severe verbal apraxia persist, affecting communication of basic  wants, needs and social interactions. Ongoing training in multimodal communication - Dustin Schneider") req'd less cuing as session progressed, for procedures for basic programming. Continue skilled ST to maximize communication  for QOL, safety and reduce family burden.    Speech Therapy Frequency 2x / week    Duration --   12 weeks or 25 visits   Treatment/Interventions Environmental controls;Cueing hierarchy;SLP instruction and feedback;Compensatory strategies;Functional tasks;Cognitive reorganization;Compensatory techniques;Internal/external aids;Multimodal communcation approach;Patient/family education;Language facilitation    Potential to Achieve Goals Good    Potential Considerations Severity of impairments           Patient will benefit from skilled therapeutic intervention in order to improve the following deficits and impairments:   Aphasia  Verbal apraxia    Problem List Patient Active Problem List   Diagnosis Date Noted  . Abnormality of gait 04/28/2020  . Neurogenic bladder 03/17/2020  . Sepsis due to pneumonia ([place]) 03/06/2020  . History of stroke with residual deficit 03/06/2020  . Transient hypotension 03/06/2020  . Spastic hemiplegia affecting nondominant side ([place])   . History of hypertension   . Expressive aphasia   . AKI (acute kidney injury) ([place])   . Global aphasia   . Hypoalbuminemia due to protein-calorie malnutrition ([place])   . Dysphagia, post-stroke   . Hyperlipidemia 02/02/2020  . Dysphagia following cerebral infarction 02/02/2020  . Aspiration pneumonia ([place]) 02/02/2020  . Acute ischemic left MCA stroke ([place]) 02/02/2020  . Acute respiratory failure ([place])   . Acute ischemic stroke [facility]) s/p clot retrieval L MCA & ACA A3 01/27/2020  . Aspiration into airway 01/27/2020  . Chronic anticoagulation 01/27/2020  . Paroxysmal atrial fibrillation ([place]) 06/15/2018  . Essential hypertension 06/15/2018  . NICM (nonischemic cardiomyopathy) ([place]) 06/15/2018  . Visit  for monitoring Tikosyn therapy 06/12/2018    [facility] ,[place], [place]  05/27/2020, 1:01 PM  [facility] [address] [place], [place], [address] Phone: [phone]   Fax:  [phone]   Name: Dustin Schneider MRN: [id number] Date of Birth: 1956-05-22

## 2020-05-27 NOTE — Therapy (Signed)
[facility] [address] [place], [place], [address] Phone: [phone]   Fax:  [phone]  Physical Therapy Treatment  Patient Details  Name: Dustin Schneider MRN: [id number] Date of Birth: September 21, 1955 Referring Provider (PT): Lauraine Rinne, PA-C (will be followed by Dr. Posey Pronto)   Encounter Date: 05/27/2020   PT End of Session - 05/27/20 1025    Visit Number 22    Number of Visits 25    Date for PT Re-Evaluation 06/14/20    Authorization Type BCBS    PT Start Time [phone]    PT Stop Time 0930    PT Time Calculation (min) 41 min    Equipment Utilized During Treatment Gait belt    Activity Tolerance Patient tolerated treatment well    Behavior During Therapy [facility] for tasks assessed/performed           Past Medical History:  Diagnosis Date  . Hypertension   . NICM (nonischemic cardiomyopathy) ([place]) 06/15/2018   04/23/18: EF 45%, 09/15/18: NOrmal LVEF  . Paroxysmal atrial fibrillation ([place]) 06/15/2018  . Stroke ([place])   . Visit for monitoring Tikosyn therapy 06/12/2018    Past Surgical History:  Procedure Laterality Date  . BUBBLE STUDY  03/11/2020   Procedure: BUBBLE STUDY;  Surgeon: [name], MD;  Location: [place];  Service: Cardiovascular;;  . CARDIOVERSION N/A 05/13/2018   Procedure: CARDIOVERSION;  Surgeon: [name], MD;  Location: [facility] ENDOSCOPY;  Service: Cardiovascular;  Laterality: N/A;  . CARDIOVERSION N/A 06/13/2018   Procedure: CARDIOVERSION;  Surgeon: [name], MD;  Location: [facility] ENDOSCOPY;  Service: Cardiovascular;  Laterality: N/A;  . CARDIOVERSION N/A 06/23/2018   Procedure: CARDIOVERSION;  Surgeon: [name], MD;  Location: [facility] ENDOSCOPY;  Service: Cardiovascular;  Laterality: N/A;  . CARDIOVERSION N/A 03/11/2020   Procedure: CARDIOVERSION;  Surgeon: [name], MD;  Location: [place];  Service: Cardiovascular;  Laterality: N/A;  . excision of actinic keratosis    . EYE SURGERY      eye lift  . IR CT HEAD LTD  01/27/2020  . IR PERCUTANEOUS ART THROMBECTOMY/INFUSION INTRACRANIAL INC DIAG ANGIO  01/27/2020      . IR PERCUTANEOUS ART THROMBECTOMY/INFUSION INTRACRANIAL INC DIAG ANGIO  01/27/2020  . RADIOLOGY WITH ANESTHESIA N/A 01/27/2020   Procedure: IR WITH ANESTHESIA;  Surgeon: Radiologist, Medication, MD;  Location: [place];  Service: Radiology;  Laterality: N/A;  . TEE WITHOUT CARDIOVERSION N/A 03/11/2020   Procedure: TRANSESOPHAGEAL ECHOCARDIOGRAM (TEE);  Surgeon: [name], MD;  Location: [place];  Service: Cardiovascular;  Laterality: N/A;  . TONSILLECTOMY    . WISDOM TOOTH EXTRACTION      There were no vitals filed for this visit.   Subjective Assessment - 05/27/20 0851    Subjective No falls. Will be going to [facility] next tuesday to be casted for his brace.    Patient is accompained by: Family member   sister   Pertinent History PMH: HTN, paroxysmal a fib    Limitations Standing;Walking    Patient Stated Goals "wants everything working on R side again"    Currently in Pain? No/denies                             [facility] Adult PT Treatment/Exercise - 05/27/20 0923      Transfers   Transfers Sit to Stand;Stand to Sit;Squat Pivot Transfers    Sit to Stand 4: Min guard    Stand to Sit  4: Min Paramedic Transfers 4: Min Cytogeneticist Details (indicate cue type and reason) from w/c > mat    Comments x7 reps sit <> stands with RLE staggered behind L for incr weight bearing/muscle activation, cues to use both hands to push off from mat table, chair anteriorly as needed for balance in standing, cues for eccentric lowering with no UE support, min A at times for balance and maintaining weight shift      Ambulation/Gait   Ambulation/Gait Yes    Ambulation/Gait Assistance 4: Min assist    Ambulation/Gait Assistance Details cues for slowed and controlled during gait for R foot placement, needing min A at times due to pt having  incr external rotation with RLE. only able to ambulate 1 lap today due to pt having incr RLE muscle tremors towards end of session after previous activities    Ambulation Distance (Feet) 115 Feet   x1   Assistive device Rolling walker   R ottobock AFO with T strap, R hand orthosis   Gait Pattern Step-through pattern;Decreased step length - right;Decreased step length - left;Decreased hip/knee flexion - right    Ambulation Surface Level;Indoor      Neuro Re-ed    Neuro Re-ed Details  Pt transferring from sitting > tall kneeling, using LUE support on blue bench and therapist providing min A to get RLE into position. Use of mirror anteriorly for visual feedback for tall posture and weight shift towards R and placing RUE on bench for incr weight bearing through RUE. Performed x10 reps hip bumps (to therapist) towards R for incr R weight shifting, then performing x10 reps half squats with manual cues to maintain weight shift towards R and verbal/tactile cues for tall posture/glute activation when returning to upright. Attempted in tall kneeling bringing LLE out and in (with LLE on towel to decr friction), for incr weight bearing and closed chain hip ABD strengthening on RLE - pt with incr forward flexed posture during a couple reps, pt reporting his R knee hurt, pt got out of position with min guard by shifting over to L side.      Exercises   Exercises Other Exercises    Other Exercises  In supine in hooklying position: therapist occasionally maintaining R ankle in proper alignment, performing x10 reps bridging with cues for pt to push out into therapist's hands for isometric hip ABD activation, then with red tband x10 reps bent knee fall outs on R with cues for slowed and controlled, then performed same activity with LLE with cues to keep R leg as still as possible - upgraded HEP to use of tband resistance.                   PT Education - 05/27/20 1025    Education Details bent knee fall outs  with red tband addition to HEP    Person(s) Educated Patient;Other (comment)   [name]   Methods Explanation;Demonstration;Handout    Comprehension Verbalized understanding;Returned demonstration            PT Short Term Goals - 05/17/20 2019      PT SHORT TERM GOAL #1   Title Pt will perform sit <> stand with RW with supervision in order to demo decr caregiver burden and improved functional BLE strength/balance. ALL STGS DUE 05/18/20    Baseline 05/17/20 supervision/CGA    Time 4    Period Weeks    Status Partially Met  Target Date 05/18/20      PT SHORT TERM GOAL #2   Title Pt will perform sit > supine with supervision and supine > sit with min guard for bed mobility in order to decr caregiver burden.    Baseline 04/15/20 supervision sit to supine, min assist supine to sit as did not get right leg off mat, 05/17/20 supervision    Time 4    Period Weeks    Status Achieved      PT SHORT TERM GOAL #3   Title Pt will undergo assessment of AFO for functional transfers and gait.    Baseline Orthotist has come and determined custom AFO need.    Time 4    Period Weeks    Status Achieved      PT SHORT TERM GOAL #4   Title Pt will ambulate 2' with RW with mod A x1 in order to improve functional mobility.    Baseline 04/15/20 Mod assist with 2nd person CGA 230' with RW. Ability does vary. 05/17/20 230' min assist with RW and right AFO    Time 4    Period Weeks    Status Achieved      PT SHORT TERM GOAL #6   Title Pt will perform squat pivot transfers from w/c <> mat table with supervision.    Baseline CGA w/c to/from mat 04/15/20    Time 4    Period Weeks    Status Revised             PT Long Term Goals - 05/17/20 2023      PT LONG TERM GOAL #1   Title Pt and pt's family members will be independent with final HEP in order to build upon functional gains made in therapy. ALL LTGS DUE 06/08/20    Time 12    Period Weeks    Status New      PT LONG TERM GOAL #2   Title Pt will  ambulate 115' with min guard/min A with RW and R AFO in order to improve household mobility.    Time 12    Period Weeks    Status New      PT LONG TERM GOAL #3   Title Pt will perform sit <> stand with RW with min guard in order to improve functional BLE strength for transfers.    Time 12    Period Weeks    Status New      PT LONG TERM GOAL #4   Title Pt will perform squat pivot transfer with mod I vs. stand pivot transfer with RW with min guard in order to decr caregiver burden.    Time 12    Period Weeks    Status New      PT LONG TERM GOAL #5   Title Pt will decrease TUG from 29.5 sec to <25 sec with RW for improved balance and decreased fall risk.    Baseline 05/17/20 29.5 sec with RW    Time 4    Period Weeks    Status New    Target Date 06/08/20                 Plan - 05/27/20 1027    Clinical Impression Statement Pt with improvements in transitional movements, needing min A to get into tall kneeling position today from sitting for assist with placement of RLE. Pt continues with more narrow BOS at times and RLE external rotation during gait. [name] RLE more fatigued  after supine strengthening/tall kneeling - with incr RLE muscle trembling during gait, unable to progress distance today. Will continue to progress towards LTGs.    Personal Factors and Comorbidities Comorbidity 3+    Comorbidities dense L MCA CVA, paroxysmal A. fib on chronic Xarelto, nonischemic cardiomyopathy and hypertension.    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, works as an Electrical engineer Evolving/Moderate complexity    Rehab Potential Good    PT Frequency 2x / week    PT Duration 12 weeks    PT Treatment/Interventions ADLs/Self Care Home Management;Aquatic Therapy;DME  Instruction;Gait training;Stair training;Functional mobility training;Therapeutic activities;Therapeutic exercise;Electrical Stimulation;Balance training;Neuromuscular re-education;Wheelchair mobility training;Orthotic Fit/Training;Patient/family education;Passive range of motion;Energy conservation    PT Next Visit Plan has appt with [facility] for casting on 05/31/20. continue with tall kneeling. attempt quadruped again. hip ABD/quad strengthening, weight shifting/NMR to RLE. continue gait training with brace, t-strap and hand orthotic.    Consulted and Agree with Plan of Care Patient;Family member/caregiver           Patient will benefit from skilled therapeutic intervention in order to improve the following deficits and impairments:  Abnormal gait, Decreased activity tolerance, Decreased balance, Decreased cognition, Decreased mobility, Decreased coordination, Decreased range of motion, Decreased endurance, Decreased strength, Hypomobility, Difficulty walking, Impaired UE functional use, Impaired vision/preception, Postural dysfunction, Impaired sensation  Visit Diagnosis: Hemiplegia and hemiparesis following cerebral infarction affecting right dominant side (HCC)  Other lack of coordination  Muscle weakness (generalized)  Other symptoms and signs involving the nervous system     Problem List Patient Active Problem List   Diagnosis Date Noted  . Abnormality of gait 04/28/2020  . Neurogenic bladder 03/17/2020  . Sepsis due to pneumonia ([place]) 03/06/2020  . History of stroke with residual deficit 03/06/2020  . Transient hypotension 03/06/2020  . Spastic hemiplegia affecting nondominant side ([place])   . History of hypertension   . Expressive aphasia   . AKI (acute kidney injury) ([place])   . Global aphasia   . Hypoalbuminemia due to protein-calorie malnutrition ([place])   . Dysphagia, post-stroke   . Hyperlipidemia 02/02/2020  . Dysphagia following cerebral infarction 02/02/2020  .  Aspiration pneumonia ([place]) 02/02/2020  . Acute ischemic left MCA stroke ([place]) 02/02/2020  . Acute respiratory failure ([place])   . Acute ischemic stroke [facility]) s/p clot retrieval L MCA & ACA A3 01/27/2020  . Aspiration into airway 01/27/2020  . Chronic anticoagulation 01/27/2020  . Paroxysmal atrial fibrillation ([place]) 06/15/2018  . Essential hypertension 06/15/2018  . NICM (nonischemic cardiomyopathy) ([place]) 06/15/2018  . Visit for monitoring Tikosyn therapy 06/12/2018    Arliss Journey, PT, DPT  05/27/2020, 10:40 AM  [facility] [address] [place], [place], [address] Phone: [phone]   Fax:  [phone]  Name: Dustin Schneider MRN: [id number] Date of Birth: [birth year]

## 2020-05-31 ENCOUNTER — Ambulatory Visit: Payer: BC Managed Care – PPO

## 2020-05-31 ENCOUNTER — Ambulatory Visit: Payer: BC Managed Care – PPO | Admitting: Physical Therapy

## 2020-05-31 ENCOUNTER — Ambulatory Visit: Payer: BC Managed Care – PPO | Admitting: Occupational Therapy

## 2020-05-31 ENCOUNTER — Other Ambulatory Visit: Payer: Self-pay

## 2020-05-31 DIAGNOSIS — R29818 Other symptoms and signs involving the nervous system: Secondary | ICD-10-CM

## 2020-05-31 DIAGNOSIS — R4701 Aphasia: Secondary | ICD-10-CM

## 2020-05-31 DIAGNOSIS — R278 Other lack of coordination: Secondary | ICD-10-CM

## 2020-05-31 DIAGNOSIS — M6281 Muscle weakness (generalized): Secondary | ICD-10-CM

## 2020-05-31 DIAGNOSIS — R2689 Other abnormalities of gait and mobility: Secondary | ICD-10-CM | POA: Diagnosis not present

## 2020-05-31 DIAGNOSIS — R482 Apraxia: Secondary | ICD-10-CM

## 2020-05-31 DIAGNOSIS — I69351 Hemiplegia and hemiparesis following cerebral infarction affecting right dominant side: Secondary | ICD-10-CM

## 2020-05-31 NOTE — Therapy (Signed)
[name] [address] [place], [place], [address] Phone: [phone]   Fax:  [phone]  Speech Language Pathology Treatment  Patient Details  Name: Dustin Schneider MRN: [id number] Date of Birth: Aug 04, 1956 Referring Provider (SLP): Dr. Delice Lesch   Encounter Date: 05/31/2020   End of Session - 05/31/20 1415    Visit Number 19    Number of Visits 25    Date for SLP Re-Evaluation 06/15/20    SLP Start Time 1018    SLP Stop Time  1102    SLP Time Calculation (min) 44 min    Activity Tolerance Patient tolerated treatment well           Past Medical History:  Diagnosis Date  . Hypertension   . NICM (nonischemic cardiomyopathy) ([place]) 06/15/2018   04/23/18: EF 45%, 09/15/18: NOrmal LVEF  . Paroxysmal atrial fibrillation ([place]) 06/15/2018  . Stroke ([place])   . Visit for monitoring Tikosyn therapy 06/12/2018    Past Surgical History:  Procedure Laterality Date  . BUBBLE STUDY  03/11/2020   Procedure: BUBBLE STUDY;  Surgeon: [name], MD;  Location: [place];  Service: Cardiovascular;;  . CARDIOVERSION N/A 05/13/2018   Procedure: CARDIOVERSION;  Surgeon: [name], MD;  Location: [facility] ENDOSCOPY;  Service: Cardiovascular;  Laterality: N/A;  . CARDIOVERSION N/A 06/13/2018   Procedure: CARDIOVERSION;  Surgeon: [name], MD;  Location: [facility] ENDOSCOPY;  Service: Cardiovascular;  Laterality: N/A;  . CARDIOVERSION N/A 06/23/2018   Procedure: CARDIOVERSION;  Surgeon: [name], MD;  Location: [facility] ENDOSCOPY;  Service: Cardiovascular;  Laterality: N/A;  . CARDIOVERSION N/A 03/11/2020   Procedure: CARDIOVERSION;  Surgeon: [name], MD;  Location: [place];  Service: Cardiovascular;  Laterality: N/A;  . excision of actinic keratosis    . EYE SURGERY     eye lift  . IR CT HEAD LTD  01/27/2020  . IR PERCUTANEOUS ART THROMBECTOMY/INFUSION INTRACRANIAL INC DIAG ANGIO  01/27/2020      . IR PERCUTANEOUS ART  THROMBECTOMY/INFUSION INTRACRANIAL INC DIAG ANGIO  01/27/2020  . RADIOLOGY WITH ANESTHESIA N/A 01/27/2020   Procedure: IR WITH ANESTHESIA;  Surgeon: Radiologist, Medication, MD;  Location: [place];  Service: Radiology;  Laterality: N/A;  . TEE WITHOUT CARDIOVERSION N/A 03/11/2020   Procedure: TRANSESOPHAGEAL ECHOCARDIOGRAM (TEE);  Surgeon: [name], MD;  Location: [place];  Service: Cardiovascular;  Laterality: N/A;  . TONSILLECTOMY    . WISDOM TOOTH EXTRACTION      There were no vitals filed for this visit.          ADULT SLP TREATMENT - 05/31/20 1303      General Information   Behavior/Cognition Alert;Cooperative;Pleasant mood      Treatment Provided   Treatment provided Cognitive-Linquistic      Cognitive-Linquistic Treatment   Treatment focused on Apraxia;Aphasia    Skilled Treatment Pt brought his Lingraphica and demonstrated he was concerned about the voicing for naming a statue. He nonvrebally indicated to SLP what he wanted changed, with rare questioning cues from SLP. SLP shared with [name], pt's SO, that Jim neeed to "noodle around" more in the device in order to learn where things were to enable him to be more efficient at device usage. SLP reiterated strongly that purpose of device was to both make everyday communication more efficient and to facilitate verbal communication via written, visual, and auditory cues. SLP provided 3-4 examples of this during the session to [facility]. She voiced understanding and affirmed picture, word and auditory cues  assist pt's verbal communication. Tearful at end of session when trying to explain what drew him to a statue he saw in [place]. [name] SO figured this out and affirmed pt's message carefully and appropriately. Pt composed himself. SLP provided education to SO on how to add pt's description of [place] voiced understanding.       Assessment / Recommendations / Plan   Plan Continue with current plan of care      Progression  Toward Goals   Progression toward goals Progressing toward goals            SLP Education - 05/31/20 1414    Education Details how to structure capturing Jim's desired message about [place] statue, device is for assistance in communicaiton and fostering verbal expression    Person(s) Educated Patient;Caregiver(s)    Methods Explanation;Demonstration    Comprehension Verbalized understanding;Need further instruction            SLP Short Term Goals - 05/09/20 1132      SLP SHORT TERM GOAL #1   Title Pt will name family, friends and art items with occasional min A 8/10 with approximations allowed.    Time 1    Period Weeks    Status Partially Met      SLP SHORT TERM GOAL #2   Title Pt will name 7 items in personally relevant categories with occasional min A over 2 sessions    Time 1    Period Weeks    Status Partially Met      SLP SHORT TERM GOAL #3   Title Pt will perform 4 automatic speech tasks with rare min A.    Time 1    Period Weeks    Status Partially Met      SLP SHORT TERM GOAL #4   Title Pt will write personal information (name/address/phone), 5 family/friend/pet names and 7  professional words with occasional min A    Time 1    Period Weeks    Status Not Met      SLP SHORT TERM GOAL #5   Title Pt will use mulitmodal communication (gesture, draw, write 1st letter etc) to augment verbal expression to meet needs at home with rare min A from family.    Time 1    Period Weeks    Status Partially Met            SLP Long Term Goals - 05/31/20 1418      SLP LONG TERM GOAL #1   Title Pt will verbalize 3 words to explain or describe his artwork and hobbies with occasional min A over 3 sessions, allowing for intellgible approximations    Baseline 05-17-20 (Lingraphica cues), 05-24-20 (lingraphica), 05-31-20 (lingraphica)    Status Achieved      SLP LONG TERM GOAL #2   Title Pt and family will utilize multimodal compensations for aphasia and verbal apraxia to  participate in 3 turns each of conversation with occasional min A over 2 sessions    Time 3    Period Weeks    Status On-going      SLP LONG TERM GOAL #3   Title Pt will write 3 word phrases with less than 2 errors with rare min A over 3 sessions    Time 3    Period Weeks    Status On-going      SLP LONG TERM GOAL #4   Title Reading assessment if indicated    Status Deferred  Plan - 05/31/20 1416    Clinical Impression Statement moderate Broca's aphasia and severe verbal apraxia persist, affecting communication of basic wants, needs and social interactions. Ongoing training in multimodal communication - Kaynan Klonowski") cont to require less cuing for procedures for basic programming. Today, SLP heard functional/normal articulation naming 6 items pt chose/selected. Continue skilled ST to maximize communication for QOL, safety and reduce family burden.    Speech Therapy Frequency 2x / week    Duration --   12 weeks or 25 visits   Treatment/Interventions Environmental controls;Cueing hierarchy;SLP instruction and feedback;Compensatory strategies;Functional tasks;Cognitive reorganization;Compensatory techniques;Internal/external aids;Multimodal communcation approach;Patient/family education;Language facilitation    Potential to Achieve Goals Good    Potential Considerations Severity of impairments           Patient will benefit from skilled therapeutic intervention in order to improve the following deficits and impairments:   Aphasia  Verbal apraxia    Problem List Patient Active Problem List   Diagnosis Date Noted  . Abnormality of gait 04/28/2020  . Neurogenic bladder 03/17/2020  . Sepsis due to pneumonia ([place]) 03/06/2020  . History of stroke with residual deficit 03/06/2020  . Transient hypotension 03/06/2020  . Spastic hemiplegia affecting nondominant side ([place])   . History of hypertension   . Expressive aphasia   . AKI (acute kidney injury) ([place])   . Global  aphasia   . Hypoalbuminemia due to protein-calorie malnutrition ([place])   . Dysphagia, post-stroke   . Hyperlipidemia 02/02/2020  . Dysphagia following cerebral infarction 02/02/2020  . Aspiration pneumonia ([place]) 02/02/2020  . Acute ischemic left MCA stroke ([place]) 02/02/2020  . Acute respiratory failure ([place])   . Acute ischemic stroke [facility]) s/p clot retrieval L MCA & ACA A3 01/27/2020  . Aspiration into airway 01/27/2020  . Chronic anticoagulation 01/27/2020  . Paroxysmal atrial fibrillation ([place]) 06/15/2018  . Essential hypertension 06/15/2018  . NICM (nonischemic cardiomyopathy) ([place]) 06/15/2018  . Visit for monitoring Tikosyn therapy 06/12/2018    [facility] ,[place], [place]  05/31/2020, 2:19 PM  [facility] [address] [place], [place], [address] Phone: [phone]   Fax:  [phone]   Name: ANDREU DRUDGE MRN: [id number] Date of Birth: 27-Apr-1956

## 2020-05-31 NOTE — Therapy (Signed)
[facility] [address] [place], [place], [address] Phone: [phone]   Fax:  [phone]  Physical Therapy Treatment  Patient Details  Name: Dustin Schneider MRN: [id number] Date of Birth: 1956-04-14 Referring Provider (PT): Lauraine Rinne, PA-C (will be followed by Dr. Posey Pronto)   Encounter Date: 05/31/2020   PT End of Session - 05/31/20 2024    Visit Number 23    Number of Visits 25    Date for PT Re-Evaluation 06/14/20    Authorization Type BCBS    PT Start Time 1112   late getting patient from completion of speech therapy   PT Stop Time 1148    PT Time Calculation (min) 36 min    Equipment Utilized During Treatment Gait belt    Activity Tolerance Patient tolerated treatment well    Behavior During Therapy [facility] for tasks assessed/performed           Past Medical History:  Diagnosis Date   Hypertension    NICM (nonischemic cardiomyopathy) ([place]) 06/15/2018   04/23/18: EF 45%, 09/15/18: NOrmal LVEF   Paroxysmal atrial fibrillation ([place]) 06/15/2018   Stroke ([place])    Visit for monitoring Tikosyn therapy 06/12/2018    Past Surgical History:  Procedure Laterality Date   BUBBLE STUDY  03/11/2020   Procedure: BUBBLE STUDY;  Surgeon: [name], MD;  Location: [place];  Service: Cardiovascular;;   CARDIOVERSION N/A 05/13/2018   Procedure: CARDIOVERSION;  Surgeon: [name], MD;  Location: [place];  Service: Cardiovascular;  Laterality: N/A;   CARDIOVERSION N/A 06/13/2018   Procedure: CARDIOVERSION;  Surgeon: [name], MD;  Location: [place];  Service: Cardiovascular;  Laterality: N/A;   CARDIOVERSION N/A 06/23/2018   Procedure: CARDIOVERSION;  Surgeon: [name], MD;  Location: [place];  Service: Cardiovascular;  Laterality: N/A;   CARDIOVERSION N/A 03/11/2020   Procedure: CARDIOVERSION;  Surgeon: [name], MD;  Location: [place];  Service: Cardiovascular;  Laterality: N/A;     excision of actinic keratosis     EYE SURGERY     eye lift   IR CT HEAD LTD  01/27/2020   IR PERCUTANEOUS ART THROMBECTOMY/INFUSION INTRACRANIAL INC DIAG ANGIO  01/27/2020       IR PERCUTANEOUS ART THROMBECTOMY/INFUSION INTRACRANIAL INC DIAG ANGIO  01/27/2020   RADIOLOGY WITH ANESTHESIA N/A 01/27/2020   Procedure: IR WITH ANESTHESIA;  Surgeon: Radiologist, Medication, MD;  Location: [place];  Service: Radiology;  Laterality: N/A;   TEE WITHOUT CARDIOVERSION N/A 03/11/2020   Procedure: TRANSESOPHAGEAL ECHOCARDIOGRAM (TEE);  Surgeon: [name], MD;  Location: [place];  Service: Cardiovascular;  Laterality: N/A;   TONSILLECTOMY     WISDOM TOOTH EXTRACTION      There were no vitals filed for this visit.   Subjective Assessment - 05/31/20 2013    Subjective Did not wear AirCast today.  Will be going to [facility] later today to be casted for brace.    Patient is accompained by: Family member   sister   Pertinent History PMH: HTN, paroxysmal a fib    Limitations Standing;Walking    Patient Stated Goals "wants everything working on R side again"    Currently in Pain? No/denies                             [facility] Adult PT Treatment/Exercise - 05/31/20 0001      Transfers   Transfers Sit to Stand;Stand to Sit;Squat Pivot Transfers  Sit to Stand 4: Min guard;4: Min assist    Sit to Stand Details Verbal cues for technique;Tactile cues for weight shifting    Sit to Stand Details (indicate cue type and reason) Min assist at times and cues for increased forward lean for improved weightbearing through RLE.    Stand to Sit 4: Min guard    Squat Pivot Transfers 4: Min Cytogeneticist Details (indicate cue type and reason) from w/c to mat, cues for R foot placement    Comments x 5 reps feet shoulder width apart, x 5 reps with feet apart and pillow squeeze for hip adduction, x 5 reps with RLE tucked posteriorly and cues for increased forward lean.       Ambulation/Gait   Ambulation/Gait Yes    Ambulation/Gait Assistance 4: Min assist    Ambulation/Gait Assistance Details Cues for slowed pace, control of R foot placement, PT assisting at times for more neutral foot placement, improved RLE foot clearance.    Ambulation Distance (Feet) 115 Feet   x 2   Assistive device Rolling walker    Gait Pattern Step-through pattern;Decreased step length - right;Decreased step length - left;Decreased hip/knee flexion - right    Ambulation Surface Level;Indoor    Gait Comments use of right anterrior ottobock with T-strap to right LE with session.       Knee/Hip Exercises: Seated   Ball Squeeze x 10 reps, PT assists with maintaining R foot placement      Knee/Hip Exercises: Supine   Hip Adduction Isometric Strengthening;Both;1 set;10 reps    Bridges Strengthening;Both;1 set;10 reps    Single Leg Bridge Strengthening;Right;1 set;10 reps   therapist assists with R foot placement   Other Supine Knee/Hip Exercises In hooklying position, manual resistance given for hip abduction RLE, x 10 reps; then in hooklying position, marching alternating legs, with manual resistance given RLE for improved control concentric and eccentric phases.                    PT Short Term Goals - 05/17/20 2019      PT SHORT TERM GOAL #1   Title Pt will perform sit <> stand with RW with supervision in order to demo decr caregiver burden and improved functional BLE strength/balance. ALL STGS DUE 05/18/20    Baseline 05/17/20 supervision/CGA    Time 4    Period Weeks    Status Partially Met    Target Date 05/18/20      PT SHORT TERM GOAL #2   Title Pt will perform sit > supine with supervision and supine > sit with min guard for bed mobility in order to decr caregiver burden.    Baseline 04/15/20 supervision sit to supine, min assist supine to sit as did not get right leg off mat, 05/17/20 supervision    Time 4    Period Weeks    Status Achieved      PT SHORT TERM GOAL  #3   Title Pt will undergo assessment of AFO for functional transfers and gait.    Baseline Orthotist has come and determined custom AFO need.    Time 4    Period Weeks    Status Achieved      PT SHORT TERM GOAL #4   Title Pt will ambulate 115 with RW with mod A x1 in order to improve functional mobility.    Baseline 04/15/20 Mod assist with 2nd person CGA 230' with RW. Ability does vary.  05/17/20 230' min assist with RW and right AFO    Time 4    Period Weeks    Status Achieved      PT SHORT TERM GOAL #6   Title Pt will perform squat pivot transfers from w/c <> mat table with supervision.    Baseline CGA w/c to/from mat 04/15/20    Time 4    Period Weeks    Status Revised             PT Long Term Goals - 05/17/20 2023      PT LONG TERM GOAL #1   Title Pt and pt's family members will be independent with final HEP in order to build upon functional gains made in therapy. ALL LTGS DUE 06/08/20    Time 12    Period Weeks    Status New      PT LONG TERM GOAL #2   Title Pt will ambulate 115' with min guard/min A with RW and R AFO in order to improve household mobility.    Time 12    Period Weeks    Status New      PT LONG TERM GOAL #3   Title Pt will perform sit <> stand with RW with min guard in order to improve functional BLE strength for transfers.    Time 12    Period Weeks    Status New      PT LONG TERM GOAL #4   Title Pt will perform squat pivot transfer with mod I vs. stand pivot transfer with RW with min guard in order to decr caregiver burden.    Time 12    Period Weeks    Status New      PT LONG TERM GOAL #5   Title Pt will decrease TUG from 29.5 sec to <25 sec with RW for improved balance and decreased fall risk.    Baseline 05/17/20 29.5 sec with RW    Time 4    Period Weeks    Status New    Target Date 06/08/20                 Plan - 05/31/20 2025    Clinical Impression Statement Pt continues to do well with use of trial AFO with T-strap to  improve R ankle stability.  Pt needs assist at times with gait to assist with more neutral foot placement to avoid external rotation and narrowed BOS.  He c/o knee pain from tall kneeling last visit, so worked with sit<>stand, sitting and supine exercises-no c/o pain.    Personal Factors and Comorbidities Comorbidity 3+    Comorbidities dense L MCA CVA, paroxysmal A. fib on chronic Xarelto, nonischemic cardiomyopathy and hypertension.    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, works as an Electrical engineer Evolving/Moderate complexity    Rehab Potential Good    PT Frequency 2x / week    PT Duration 12 weeks    PT Treatment/Interventions ADLs/Self Care Home Management;Aquatic Therapy;DME Instruction;Gait training;Stair training;Functional mobility training;Therapeutic activities;Therapeutic exercise;Electrical Stimulation;Balance training;Neuromuscular re-education;Wheelchair mobility training;Orthotic Fit/Training;Patient/family education;Passive range of motion;Energy conservation    PT Next Visit Plan Appears that LTGs are due soon; has appt with [facility] for casting on 05/31/20. continue with tall kneeling. attempt quadruped again. hip ABD/quad strengthening, weight shifting/NMR to RLE. continue gait training with brace, t-strap and hand orthotic.    Consulted and Agree  with Plan of Care Patient;Family member/caregiver           Patient will benefit from skilled therapeutic intervention in order to improve the following deficits and impairments:  Abnormal gait, Decreased activity tolerance, Decreased balance, Decreased cognition, Decreased mobility, Decreased coordination, Decreased range of motion, Decreased endurance, Decreased strength, Hypomobility, Difficulty walking, Impaired  UE functional use, Impaired vision/preception, Postural dysfunction, Impaired sensation  Visit Diagnosis: Other abnormalities of gait and mobility  Muscle weakness (generalized)     Problem List Patient Active Problem List   Diagnosis Date Noted   Abnormality of gait 04/28/2020   Neurogenic bladder 03/17/2020   Sepsis due to pneumonia ([place]) 03/06/2020   History of stroke with residual deficit 03/06/2020   Transient hypotension 03/06/2020   Spastic hemiplegia affecting nondominant side (HCC)    History of hypertension    Expressive aphasia    AKI (acute kidney injury) ([place])    Global aphasia    Hypoalbuminemia due to protein-calorie malnutrition ([place])    Dysphagia, post-stroke    Hyperlipidemia 02/02/2020   Dysphagia following cerebral infarction 02/02/2020   Aspiration pneumonia ([place]) 02/02/2020   Acute ischemic left MCA stroke ([place]) 02/02/2020   Acute respiratory failure (HCC)    Acute ischemic stroke ([place]) s/p clot retrieval L MCA & ACA A3 01/27/2020   Aspiration into airway 01/27/2020   Chronic anticoagulation 01/27/2020   Paroxysmal atrial fibrillation ([place]) 06/15/2018   Essential hypertension 06/15/2018   NICM (nonischemic cardiomyopathy) ([place]) 06/15/2018   Visit for monitoring Tikosyn therapy 06/12/2018    Emilyann Banka W. 05/31/2020, 8:29 PM  Frazier Butt., PT   [facility] [address] [place], [place], [address] Phone: [phone]   Fax:  [phone]  Name: Dustin Schneider MRN: [id number] Date of Birth: 02-02-1956

## 2020-05-31 NOTE — Therapy (Addendum)
[facility] [address] [place], [place], [address] Phone: [phone]   Fax:  [phone]  Occupational Therapy Treatment  Patient Details  Name: Dustin Schneider MRN: [id number] Date of Birth: 12/26/1955 No data recorded  Encounter Date: 05/31/2020   OT End of Session - 05/31/20 1237    Visit Number 20    Number of Visits 25    Date for OT Re-Evaluation 06/23/20    Authorization Type BC/BS - No auth required, covered 100%    OT Start Time 0935    OT Stop Time 1015    OT Time Calculation (min) 40 min    Activity Tolerance Patient tolerated treatment well    Behavior During Therapy [facility] for tasks assessed/performed           Past Medical History:  Diagnosis Date  . Hypertension   . NICM (nonischemic cardiomyopathy) ([place]) 06/15/2018   04/23/18: EF 45%, 09/15/18: NOrmal LVEF  . Paroxysmal atrial fibrillation ([place]) 06/15/2018  . Stroke ([place])   . Visit for monitoring Tikosyn therapy 06/12/2018    Past Surgical History:  Procedure Laterality Date  . BUBBLE STUDY  03/11/2020   Procedure: BUBBLE STUDY;  Surgeon: [name], MD;  Location: [place];  Service: Cardiovascular;;  . CARDIOVERSION N/A 05/13/2018   Procedure: CARDIOVERSION;  Surgeon: [name], MD;  Location: [facility] ENDOSCOPY;  Service: Cardiovascular;  Laterality: N/A;  . CARDIOVERSION N/A 06/13/2018   Procedure: CARDIOVERSION;  Surgeon: [name], MD;  Location: [facility] ENDOSCOPY;  Service: Cardiovascular;  Laterality: N/A;  . CARDIOVERSION N/A 06/23/2018   Procedure: CARDIOVERSION;  Surgeon: [name], MD;  Location: [facility] ENDOSCOPY;  Service: Cardiovascular;  Laterality: N/A;  . CARDIOVERSION N/A 03/11/2020   Procedure: CARDIOVERSION;  Surgeon: [name], MD;  Location: [place];  Service: Cardiovascular;  Laterality: N/A;  . excision of actinic keratosis    . EYE SURGERY     eye lift  . IR CT HEAD LTD  01/27/2020  . IR PERCUTANEOUS ART  THROMBECTOMY/INFUSION INTRACRANIAL INC DIAG ANGIO  01/27/2020      . IR PERCUTANEOUS ART THROMBECTOMY/INFUSION INTRACRANIAL INC DIAG ANGIO  01/27/2020  . RADIOLOGY WITH ANESTHESIA N/A 01/27/2020   Procedure: IR WITH ANESTHESIA;  Surgeon: Radiologist, Medication, MD;  Location: [place];  Service: Radiology;  Laterality: N/A;  . TEE WITHOUT CARDIOVERSION N/A 03/11/2020   Procedure: TRANSESOPHAGEAL ECHOCARDIOGRAM (TEE);  Surgeon: [name], MD;  Location: [place];  Service: Cardiovascular;  Laterality: N/A;  . TONSILLECTOMY    . WISDOM TOOTH EXTRACTION      There were no vitals filed for this visit.   Subjective Assessment - 05/31/20 1230    Subjective  Pt reports mild wrist pain when asked    Pertinent History Lt MCA CVA 01/27/20, readmitted to hospital with HAP and fever on 03/06/20 and released 03/11/20. PMH: paroxsymal A-fib, HTN    Limitations fall    Currently in Pain? Yes    Pain Score 3    mild   Pain Location Wrist    Pain Orientation Right    Pain Descriptors / Indicators Aching    Pain Type Acute pain    Pain Onset 1 to 4 weeks ago    Pain Frequency Intermittent    Aggravating Factors  malpositioning    Pain Relieving Factors repositioning             Treatment:Gentle mobilization and passive stretch to right wrist, edema present at dorsal wrist. Therapist initiated fabrication and  fitting of a custom splint. Therapist did not issue due to time constraints.                     OT Short Term Goals - 05/05/20 1125      OT SHORT TERM GOAL #1   Title Independent with initial HEP    Time 4    Period Weeks    Status Achieved      OT SHORT TERM GOAL #2   Title Pt to perform low to mid level reaching of light objects RUE in prep for feeding self and bathing    Time 4    Period Weeks    Status Achieved   04/20/20     OT SHORT TERM GOAL #3   Title Pt to feed self 50% of the time with Rt dominant hand and A/E prn    Time 4    Period Weeks    Status  On-going   04/20/20: able to eat finger foods     OT SHORT TERM GOAL #4   Title Pt to perform UE dressing of pullover shirt mod I level    Time 4    Period Weeks    Status Achieved   met per pt and brother     OT SHORT TERM GOAL #5   Title Pt to perform LE dressing and all bathing with no more than mod assist using A/E and strategies prn    Time 4    Period Weeks    Status Achieved   04/20/20:  set-up for bedside bath, mod A for LE dressing     OT SHORT TERM GOAL #6   Title Pt to perform toileting and toilet transfers with no more than min assist overall    Time 4    Period Weeks    Status On-going      OT SHORT TERM GOAL #7   Title Improve functional use of RUE as evidenced by performing 8 blocks on Box & Blocks test    Baseline 0    Time 4    Period Weeks    Status Achieved   04/20/20:  6 blocks, 05/05/20: 12 blocks            OT Long Term Goals - 05/05/20 1126      OT LONG TERM GOAL #1   Title Pt to be independent with updated HEP    Time 12    Period Weeks    Status New      OT LONG TERM GOAL #2   Title Pt to perform all BADLS at mod I level    Time 12    Period Weeks    Status On-going      OT LONG TERM GOAL #3   Title Pt to feed self 75% or greater with Rt dominant hand and grooming 50% or more with Rt hand    Time 12    Period Weeks    Status New      OT LONG TERM GOAL #4   Title Pt to retrieve light weight objects from high shelf RUE consistently    Time 12    Period Weeks    Status New      OT LONG TERM GOAL #5   Title Pt to demo 25 lbs grip strength or more Rt dominant hand for gripping, opening jars/containers    Time 12    Period Weeks    Status On-going   12.5 lbs  OT LONG TERM GOAL #6   Title Pt to perform simple meal prep and light cleaning tasks mod I level    Time 12    Period Weeks    Status New      OT LONG TERM GOAL #7   Title Improve functional use RUE as evidenced by performing 25 blocks on Box & Blocks and performing 9 hole peg  test in 90 sec or under    Time 12    Period Weeks    Status On-going   12 blocks     OT LONG TERM GOAL #8   Title Pt to returned to modified art/sculpting tasks with task modifications and A/E prn    Time 12    Period Weeks    Status New                 Plan - 05/31/20 1232    Clinical Impression Statement Pt is progressing towards goals however he is limited by right wrist pain and stiffness. Therapist initiated fabrication and fitting of a custom wrist cock up splint today, however did not issue due to time constraints.    Occupational performance deficits (Please refer to evaluation for details): ADL's;IADL's;Work;Leisure;Social Participation    Body Structure / Function / Physical Skills ADL;ROM;Dexterity;Balance;IADL;Body mechanics;Improper spinal/pelvic alignment;Sensation;Mobility;Flexibility;Strength;Coordination;FMC;Tone;UE functional use;Decreased knowledge of use of DME;Proprioception    Rehab Potential Good    OT Frequency 2x / week    OT Duration 12 weeks    OT Treatment/Interventions Self-care/ADL training;Therapeutic exercise;Functional Mobility Training;Neuromuscular education;Manual Therapy;Splinting;Aquatic Therapy;Manual lymph drainage;Therapeutic activities;Coping strategies training;DME and/or AE instruction;Electrical Stimulation;Fluidtherapy;Passive Schneider of motion;Patient/family education    Plan Complete splint and issue next visit    Consulted and Agree with Plan of Care Patient           Patient will benefit from skilled therapeutic intervention in order to improve the following deficits and impairments:   Body Structure / Function / Physical Skills: ADL, ROM, Dexterity, Balance, IADL, Body mechanics, Improper spinal/pelvic alignment, Sensation, Mobility, Flexibility, Strength, Coordination, FMC, Tone, UE functional use, Decreased knowledge of use of DME, Proprioception       Visit Diagnosis: Hemiplegia and hemiparesis following cerebral  infarction affecting right dominant side (HCC)  Other lack of coordination  Muscle weakness (generalized)  Other symptoms and signs involving the nervous system    Problem List Patient Active Problem List   Diagnosis Date Noted  . Abnormality of gait 04/28/2020  . Neurogenic bladder 03/17/2020  . Sepsis due to pneumonia ([place]) 03/06/2020  . History of stroke with residual deficit 03/06/2020  . Transient hypotension 03/06/2020  . Spastic hemiplegia affecting nondominant side ([place])   . History of hypertension   . Expressive aphasia   . AKI (acute kidney injury) ([place])   . Global aphasia   . Hypoalbuminemia due to protein-calorie malnutrition ([place])   . Dysphagia, post-stroke   . Hyperlipidemia 02/02/2020  . Dysphagia following cerebral infarction 02/02/2020  . Aspiration pneumonia ([place]) 02/02/2020  . Acute ischemic left MCA stroke ([place]) 02/02/2020  . Acute respiratory failure ([place])   . Acute ischemic stroke [facility]) s/p clot retrieval L MCA & ACA A3 01/27/2020  . Aspiration into airway 01/27/2020  . Chronic anticoagulation 01/27/2020  . Paroxysmal atrial fibrillation ([name]) 06/15/2018  . Essential hypertension 06/15/2018  . NICM (nonischemic cardiomyopathy) ([place]) 06/15/2018  . Visit for monitoring Tikosyn therapy 06/12/2018    Dustin Schneider 05/31/2020, 12:38 PM  [facility] [address] [place], [place],  [address] Phone: [phone]   Fax:  [phone]  Name: Dustin Schneider MRN: [id number] Date of Birth: 16-Mar-1956

## 2020-05-31 NOTE — Patient Instructions (Addendum)
  Engage in the following task -   Hide and Seek Task:  Find:  - [place] address ([place] could also just find things that family, [name], or aide ask you to find!

## 2020-06-03 ENCOUNTER — Other Ambulatory Visit: Payer: Self-pay

## 2020-06-03 ENCOUNTER — Ambulatory Visit: Payer: BC Managed Care – PPO

## 2020-06-03 ENCOUNTER — Ambulatory Visit: Payer: BC Managed Care – PPO | Admitting: Occupational Therapy

## 2020-06-03 DIAGNOSIS — R482 Apraxia: Secondary | ICD-10-CM

## 2020-06-03 DIAGNOSIS — M6281 Muscle weakness (generalized): Secondary | ICD-10-CM

## 2020-06-03 DIAGNOSIS — I69351 Hemiplegia and hemiparesis following cerebral infarction affecting right dominant side: Secondary | ICD-10-CM

## 2020-06-03 DIAGNOSIS — R2689 Other abnormalities of gait and mobility: Secondary | ICD-10-CM

## 2020-06-03 DIAGNOSIS — R2681 Unsteadiness on feet: Secondary | ICD-10-CM

## 2020-06-03 DIAGNOSIS — R600 Localized edema: Secondary | ICD-10-CM

## 2020-06-03 DIAGNOSIS — R278 Other lack of coordination: Secondary | ICD-10-CM

## 2020-06-03 DIAGNOSIS — R4701 Aphasia: Secondary | ICD-10-CM

## 2020-06-03 NOTE — Patient Instructions (Signed)
Your Splint This splint should initially be fitted by a healthcare practitioner.  The healthcare practitioner is responsible for providing wearing instructions and precautions to the patient, other healthcare practitioners and care provider involved in the patient's care.  This splint was custom made for you. Please read the following instructions to learn about wearing and caring for your splint.  Precautions Should your splint cause any of the following problems, remove the splint immediately and contact your therapist/physician.  Swelling  Severe Pain  Pressure Areas  Stiffness  Numbness  Do not wear your splint while operating machinery unless it has been fabricated for that purpose.  When To Wear Your Splint Where your splint according to your therapist/physician instructions. Daytime for 1 hours initally then remove splint and check skin for pressure areas or redness that does not go away after 15 mins. If any problems STOP wearing splint! If not problems wear splint for 2-3 hours at a time while awake, do not sleep in splint.  Care and Cleaning of Your Splint 1. Keep your splint away from open flames. 2. Your splint will lose its shape in temperatures over 135 degrees Farenheit, ( in car windows, near radiators, ovens or in hot water).  Never make any adjustments to your splint, if the splint needs adjusting remove it and make an appointment to see your therapist. 3. Your splint, including the cushion liner may be cleaned with soap and lukewarm water.  Do not immerse in hot water over 135 degrees Farenheit. Or wipe out splint with rubbing alcohol. 4. Straps may be washed with soap and water, but do not moisten the self-adhesive portion.

## 2020-06-03 NOTE — Therapy (Signed)
[facility] [address] [place], [place], [address] Phone: [phone]   Fax:  [phone]  Occupational Therapy Treatment  Patient Details  Name: Dustin Schneider MRN: [id number] Date of Birth: 1956/02/18 No data recorded  Encounter Date: 06/03/2020   OT End of Session - 06/03/20 1022    Visit Number 21    Number of Visits 25    Date for OT Re-Evaluation 06/23/20    Authorization Type BC/BS - No auth required, covered 100%    OT Start Time 0935    OT Stop Time 1015    OT Time Calculation (min) 40 min    Activity Tolerance Patient tolerated treatment well    Behavior During Therapy [facility] for tasks assessed/performed           Past Medical History:  Diagnosis Date  . Hypertension   . NICM (nonischemic cardiomyopathy) ([place]) 06/15/2018   04/23/18: EF 45%, 09/15/18: NOrmal LVEF  . Paroxysmal atrial fibrillation ([place]) 06/15/2018  . Stroke ([place])   . Visit for monitoring Tikosyn therapy 06/12/2018    Past Surgical History:  Procedure Laterality Date  . BUBBLE STUDY  03/11/2020   Procedure: BUBBLE STUDY;  Surgeon: [name], MD;  Location: [facility];  Service: Cardiovascular;;  . CARDIOVERSION N/A 05/13/2018   Procedure: CARDIOVERSION;  Surgeon: [name], MD;  Location: [facility] ENDOSCOPY;  Service: Cardiovascular;  Laterality: N/A;  . CARDIOVERSION N/A 06/13/2018   Procedure: CARDIOVERSION;  Surgeon: [name], MD;  Location: [facility] ENDOSCOPY;  Service: Cardiovascular;  Laterality: N/A;  . CARDIOVERSION N/A 06/23/2018   Procedure: CARDIOVERSION;  Surgeon: [name], MD;  Location: [facility] ENDOSCOPY;  Service: Cardiovascular;  Laterality: N/A;  . CARDIOVERSION N/A 03/11/2020   Procedure: CARDIOVERSION;  Surgeon: [name], MD;  Location: [facility];  Service: Cardiovascular;  Laterality: N/A;  . excision of actinic keratosis    . EYE SURGERY     eye lift  . IR CT HEAD LTD  01/27/2020  . IR PERCUTANEOUS ART  THROMBECTOMY/INFUSION INTRACRANIAL INC DIAG ANGIO  01/27/2020      . IR PERCUTANEOUS ART THROMBECTOMY/INFUSION INTRACRANIAL INC DIAG ANGIO  01/27/2020  . RADIOLOGY WITH ANESTHESIA N/A 01/27/2020   Procedure: IR WITH ANESTHESIA;  Surgeon: Radiologist, Medication, MD;  Location: [facility];  Service: Radiology;  Laterality: N/A;  . TEE WITHOUT CARDIOVERSION N/A 03/11/2020   Procedure: TRANSESOPHAGEAL ECHOCARDIOGRAM (TEE);  Surgeon: [name], MD;  Location: [facility];  Service: Cardiovascular;  Laterality: N/A;  . TONSILLECTOMY    . WISDOM TOOTH EXTRACTION      There were no vitals filed for this visit.   Subjective Assessment - 06/03/20 1345    Pertinent History Lt MCA CVA 01/27/20, readmitted to hospital with HAP and fever on 03/06/20 and released 03/11/20. PMH: paroxsymal A-fib, HTN    Limitations fall    Currently in Pain? No/denies                       Treatment:while therapist completed modifications to splint and added strap, pt performed functional grasp/ release of pegs to place in semi circle. After pt was fitted with his splint, he wore splint to place large pegs into pegboard, pt demonstrates improved positioning and control.         OT Education - 06/03/20 1350    Education Details splint wear, care and precautions- daytime wear only, pt instructed to continue pre-fab splint at night    Person(s) Educated Patient;Caregiver(s)  Methods Explanation;Demonstration;Verbal cues    Comprehension Verbalized understanding;Returned demonstration;Verbal cues required            OT Short Term Goals - 05/05/20 1125      OT SHORT TERM GOAL #1   Title Independent with initial HEP    Time 4    Period Weeks    Status Achieved      OT SHORT TERM GOAL #2   Title Pt to perform low to mid level reaching of light objects RUE in prep for feeding self and bathing    Time 4    Period Weeks    Status Achieved   04/20/20     OT SHORT TERM GOAL #3   Title Pt to feed self  50% of the time with Rt dominant hand and A/E prn    Time 4    Period Weeks    Status On-going   04/20/20: able to eat finger foods     OT SHORT TERM GOAL #4   Title Pt to perform UE dressing of pullover shirt mod I level    Time 4    Period Weeks    Status Achieved   met per pt and brother     OT SHORT TERM GOAL #5   Title Pt to perform LE dressing and all bathing with no more than mod assist using A/E and strategies prn    Time 4    Period Weeks    Status Achieved   04/20/20:  set-up for bedside bath, mod A for LE dressing     OT SHORT TERM GOAL #6   Title Pt to perform toileting and toilet transfers with no more than min assist overall    Time 4    Period Weeks    Status On-going      OT SHORT TERM GOAL #7   Title Improve functional use of RUE as evidenced by performing 8 blocks on Box & Blocks test    Baseline 0    Time 4    Period Weeks    Status Achieved   04/20/20:  6 blocks, 05/05/20: 12 blocks            OT Long Term Goals - 05/05/20 1126      OT LONG TERM GOAL #1   Title Pt to be independent with updated HEP    Time 12    Period Weeks    Status New      OT LONG TERM GOAL #2   Title Pt to perform all BADLS at mod I level    Time 12    Period Weeks    Status On-going      OT LONG TERM GOAL #3   Title Pt to feed self 75% or greater with Rt dominant hand and grooming 50% or more with Rt hand    Time 12    Period Weeks    Status New      OT LONG TERM GOAL #4   Title Pt to retrieve light weight objects from high shelf RUE consistently    Time 12    Period Weeks    Status New      OT LONG TERM GOAL #5   Title Pt to demo 25 lbs grip strength or more Rt dominant hand for gripping, opening jars/containers    Time 12    Period Weeks    Status On-going   12.5 lbs     OT LONG TERM GOAL #6  Title Pt to perform simple meal prep and light cleaning tasks mod I level    Time 12    Period Weeks    Status New      OT LONG TERM GOAL #7   Title Improve  functional use RUE as evidenced by performing 25 blocks on Box & Blocks and performing 9 hole peg test in 90 sec or under    Time 12    Period Weeks    Status On-going   12 blocks     OT LONG TERM GOAL #8   Title Pt to returned to modified art/sculpting tasks with task modifications and A/E prn    Time 12    Period Weeks    Status New                 Plan - 06/03/20 1346    Clinical Impression Statement Pt is progressing towards goals. Therapist completed fabrication and fitting of a wrist cock up splint with RUE due to recent pain and edema.    OT Occupational Profile and History Detailed Assessment- Review of Records and additional review of physical, cognitive, psychosocial history related to current functional performance    Occupational performance deficits (Please refer to evaluation for details): ADL's;IADL's;Work;Leisure;Social Participation    Body Structure / Function / Physical Skills ADL;ROM;Dexterity;Balance;IADL;Body mechanics;Improper spinal/pelvic alignment;Sensation;Mobility;Flexibility;Strength;Coordination;FMC;Tone;UE functional use;Decreased knowledge of use of DME;Proprioception    OT Frequency 2x / week    OT Duration 12 weeks    OT Treatment/Interventions Self-care/ADL training;Therapeutic exercise;Functional Mobility Training;Neuromuscular education;Manual Therapy;Splinting;Aquatic Therapy;Manual lymph drainage;Therapeutic activities;Coping strategies training;DME and/or AE instruction;Electrical Stimulation;Fluidtherapy;Passive range of motion;Patient/family education    Plan splint check, email MD(Patel ) regarding possible botox?, NMR    Consulted and Agree with Plan of Care Patient    Family Member Consulted brother           Patient will benefit from skilled therapeutic intervention in order to improve the following deficits and impairments:   Body Structure / Function / Physical Skills: ADL, ROM, Dexterity, Balance, IADL, Body mechanics, Improper  spinal/pelvic alignment, Sensation, Mobility, Flexibility, Strength, Coordination, FMC, Tone, UE functional use, Decreased knowledge of use of DME, Proprioception       Visit Diagnosis: Muscle weakness (generalized)  Hemiplegia and hemiparesis following cerebral infarction affecting right dominant side (HCC)  Localized edema  Other lack of coordination    Problem List Patient Active Problem List   Diagnosis Date Noted  . Abnormality of gait 04/28/2020  . Neurogenic bladder 03/17/2020  . Sepsis due to pneumonia ([place]) 03/06/2020  . History of stroke with residual deficit 03/06/2020  . Transient hypotension 03/06/2020  . Spastic hemiplegia affecting nondominant side ([place])   . History of hypertension   . Expressive aphasia   . AKI (acute kidney injury) ([place])   . Global aphasia   . Hypoalbuminemia due to protein-calorie malnutrition ([place])   . Dysphagia, post-stroke   . Hyperlipidemia 02/02/2020  . Dysphagia following cerebral infarction 02/02/2020  . Aspiration pneumonia ([place]) 02/02/2020  . Acute ischemic left MCA stroke ([place]) 02/02/2020  . Acute respiratory failure ([place])   . Acute ischemic stroke [facility]) s/p clot retrieval L MCA & ACA A3 01/27/2020  . Aspiration into airway 01/27/2020  . Chronic anticoagulation 01/27/2020  . Paroxysmal atrial fibrillation ([place]) 06/15/2018  . Essential hypertension 06/15/2018  . NICM (nonischemic cardiomyopathy) ([place]) 06/15/2018  . Visit for monitoring Tikosyn therapy 06/12/2018    Yomira Flitton 06/03/2020, 1:51 PM  [facility] [address]  [place], [place], [address] Phone: [phone]   Fax:  [phone]  Name: Dustin Schneider MRN: [id number] Date of Birth: Dec 11, 1955

## 2020-06-03 NOTE — Patient Instructions (Signed)
Take a new picture of [name]. Use the device when you want food, find it on the device.

## 2020-06-03 NOTE — Therapy (Signed)
[facility] [address] [place], [place], [address] Phone: [phone]   Fax:  [phone]  Speech Language Pathology Treatment  Patient Details  Name: Dustin Schneider MRN: [id number] Date of Birth: [birth year] Referring Provider (SLP): Dr. Delice Lesch   Encounter Date: 06/03/2020   End of Session - 06/03/20 1136    Visit Number 20    Number of Visits 25    Date for SLP Re-Evaluation 06/15/20    SLP Start Time 1018    SLP Stop Time  1100    SLP Time Calculation (min) 42 min    Activity Tolerance Patient tolerated treatment well           Past Medical History:  Diagnosis Date  . Hypertension   . NICM (nonischemic cardiomyopathy) ([place]) 06/15/2018   04/23/18: EF 45%, 09/15/18: NOrmal LVEF  . Paroxysmal atrial fibrillation ([place]) 06/15/2018  . Stroke ([place])   . Visit for monitoring Tikosyn therapy 06/12/2018    Past Surgical History:  Procedure Laterality Date  . BUBBLE STUDY  03/11/2020   Procedure: BUBBLE STUDY;  Surgeon: [name], MD;  Location: [facility];  Service: Cardiovascular;;  . CARDIOVERSION N/A 05/13/2018   Procedure: CARDIOVERSION;  Surgeon: [name], MD;  Location: [facility] ENDOSCOPY;  Service: Cardiovascular;  Laterality: N/A;  . CARDIOVERSION N/A 06/13/2018   Procedure: CARDIOVERSION;  Surgeon: [name], MD;  Location: [facility] ENDOSCOPY;  Service: Cardiovascular;  Laterality: N/A;  . CARDIOVERSION N/A 06/23/2018   Procedure: CARDIOVERSION;  Surgeon: [name], MD;  Location: [facility] ENDOSCOPY;  Service: Cardiovascular;  Laterality: N/A;  . CARDIOVERSION N/A 03/11/2020   Procedure: CARDIOVERSION;  Surgeon: [name], MD;  Location: [place];  Service: Cardiovascular;  Laterality: N/A;  . excision of actinic keratosis    . EYE SURGERY     eye lift  . IR CT HEAD LTD  01/27/2020  . IR PERCUTANEOUS ART THROMBECTOMY/INFUSION INTRACRANIAL INC DIAG ANGIO  01/27/2020      . IR PERCUTANEOUS ART  THROMBECTOMY/INFUSION INTRACRANIAL INC DIAG ANGIO  01/27/2020  . RADIOLOGY WITH ANESTHESIA N/A 01/27/2020   Procedure: IR WITH ANESTHESIA;  Surgeon: Radiologist, Medication, MD;  Location: [facility];  Service: Radiology;  Laterality: N/A;  . TEE WITHOUT CARDIOVERSION N/A 03/11/2020   Procedure: TRANSESOPHAGEAL ECHOCARDIOGRAM (TEE);  Surgeon: [name], MD;  Location: [place];  Service: Cardiovascular;  Laterality: N/A;  . TONSILLECTOMY    . WISDOM TOOTH EXTRACTION      There were no vitals filed for this visit.   Subjective Assessment - 06/03/20 1022    Subjective "Sho-gurt  hand  coffyee" (yogurt and coffee)    Patient is accompained by: [name] - BIL   Currently in Pain? No/denies                 ADULT SLP TREATMENT - 06/03/20 1035      General Information   Behavior/Cognition Alert;Cooperative;Pleasant mood      Treatment Provided   Treatment provided Cognitive-Linquistic      Cognitive-Linquistic Treatment   Treatment focused on Apraxia;Aphasia    Skilled Treatment [name] showed SLP he had changed the voice to an [place] male, and he chuckled as he showed SLP. SLP educated jim and [name] how to back up the [place] and shared I will take the device next week to send it back to [place]. Pt req'd occasional min A to make new icon and take picture, and rare nonverbal cues for accuracy in typing his  name and a short phrase.      Assessment / Recommendations / Plan   Plan Continue with current plan of care   take Lingraphica Tuesday 06-07-20     Progression Toward Goals   Progression toward goals Progressing toward goals            SLP Education - 06/03/20 1136    Education Details backing up [employer]) Educated Patient;Caregiver(s)    Methods Explanation;Demonstration    Comprehension Verbalized understanding;Need further instruction            SLP Short Term Goals - 05/09/20 1132      SLP SHORT TERM GOAL #1   Title Pt will name  family, friends and art items with occasional min A 8/10 with approximations allowed.    Time 1    Period Weeks    Status Partially Met      SLP SHORT TERM GOAL #2   Title Pt will name 7 items in personally relevant categories with occasional min A over 2 sessions    Time 1    Period Weeks    Status Partially Met      SLP SHORT TERM GOAL #3   Title Pt will perform 4 automatic speech tasks with rare min A.    Time 1    Period Weeks    Status Partially Met      SLP SHORT TERM GOAL #4   Title Pt will write personal information (name/address/phone), 5 family/friend/pet names and 7  professional words with occasional min A    Time 1    Period Weeks    Status Not Met      SLP SHORT TERM GOAL #5   Title Pt will use mulitmodal communication (gesture, draw, write 1st letter etc) to augment verbal expression to meet needs at home with rare min A from family.    Time 1    Period Weeks    Status Partially Met            SLP Long Term Goals - 06/03/20 1137      SLP LONG TERM GOAL #1   Title Pt will verbalize 3 words to explain or describe his artwork and hobbies with occasional min A over 3 sessions, allowing for intellgible approximations    Baseline 05-17-20 (Lingraphica cues), 05-24-20 (lingraphica), 05-31-20 (lingraphica)    Status Achieved      SLP LONG TERM GOAL #2   Title Pt and family will utilize multimodal compensations for aphasia and verbal apraxia to participate in 3 turns each of conversation with occasional min A over 2 sessions    Time 3    Period Weeks    Status On-going      SLP LONG TERM GOAL #3   Title Pt will write 3 word phrases with less than 2 errors with rare min A over 3 sessions    Time 3    Period Weeks    Status On-going      SLP LONG TERM GOAL #4   Title Reading assessment if indicated    Status Deferred            Plan - 06/03/20 1137    Clinical Impression Statement moderate Broca's aphasia and severe verbal apraxia persist, affecting  communication of basic wants, needs and social interactions. Ongoing training in multimodal communication - Dustin Schneider") cont to require less cuing for procedures for basic programming. Today, SLP heard functional/normal articulation naming 6 items pt chose/selected.  Continue skilled ST to maximize communication for QOL, safety and reduce family burden.    Speech Therapy Frequency 2x / week    Duration --   12 weeks or 25 visits   Treatment/Interventions Environmental controls;Cueing hierarchy;SLP instruction and feedback;Compensatory strategies;Functional tasks;Cognitive reorganization;Compensatory techniques;Internal/external aids;Multimodal communcation approach;Patient/family education;Language facilitation    Potential to Achieve Goals Good    Potential Considerations Severity of impairments           Patient will benefit from skilled therapeutic intervention in order to improve the following deficits and impairments:   Aphasia  Verbal apraxia    Problem List Patient Active Problem List   Diagnosis Date Noted  . Abnormality of gait 04/28/2020  . Neurogenic bladder 03/17/2020  . Sepsis due to pneumonia ([place]) 03/06/2020  . History of stroke with residual deficit 03/06/2020  . Transient hypotension 03/06/2020  . Spastic hemiplegia affecting nondominant side ([place])   . History of hypertension   . Expressive aphasia   . AKI (acute kidney injury) ([place])   . Global aphasia   . Hypoalbuminemia due to protein-calorie malnutrition ([place])   . Dysphagia, post-stroke   . Hyperlipidemia 02/02/2020  . Dysphagia following cerebral infarction 02/02/2020  . Aspiration pneumonia ([place]) 02/02/2020  . Acute ischemic left MCA stroke ([place]) 02/02/2020  . Acute respiratory failure ([place])   . Acute ischemic stroke [facility]) s/p clot retrieval L MCA & ACA A3 01/27/2020  . Aspiration into airway 01/27/2020  . Chronic anticoagulation 01/27/2020  . Paroxysmal atrial fibrillation ([place]) 06/15/2018  .  Essential hypertension 06/15/2018  . NICM (nonischemic cardiomyopathy) ([place]) 06/15/2018  . Visit for monitoring Tikosyn therapy 06/12/2018    [facility] ,[place], [place]  06/03/2020, 11:38 AM  [facility] [address] [place], [place], [address] Phone: [phone]   Fax:  [phone]   Name: Dustin Schneider MRN: [id number] Date of Birth: 28-Feb-1956

## 2020-06-03 NOTE — Therapy (Signed)
[facility] [address] [place], [place], [address] Phone: [phone]   Fax:  [phone]  Physical Therapy Treatment/Recert  Patient Details  Name: Dustin Schneider MRN: [id number] Date of Birth: [birth year] Referring Provider (PT): Mariam Dollar, PA-C (will be followed by Dr. Allena Katz)   Encounter Date: 06/03/2020   PT End of Session - 06/03/20 0847    Visit Number 24    Number of Visits 48    Date for PT Re-Evaluation 09/01/20    Authorization Type BCBS    PT Start Time 0845    PT Stop Time 0930    PT Time Calculation (min) 45 min    Equipment Utilized During Treatment Gait belt    Activity Tolerance Patient tolerated treatment well    Behavior During Therapy [facility] for tasks assessed/performed           Past Medical History:  Diagnosis Date  . Hypertension   . NICM (nonischemic cardiomyopathy) (HCC) 06/15/2018   04/23/18: EF 45%, 09/15/18: NOrmal LVEF  . Paroxysmal atrial fibrillation (HCC) 06/15/2018  . Stroke (HCC)   . Visit for monitoring Tikosyn therapy 06/12/2018    Past Surgical History:  Procedure Laterality Date  . BUBBLE STUDY  03/11/2020   Procedure: BUBBLE STUDY;  Surgeon: Yates Decamp, MD;  Location: [facility] ENDOSCOPY;  Service: Cardiovascular;;  . CARDIOVERSION N/A 05/13/2018   Procedure: CARDIOVERSION;  Surgeon: Yates Decamp, MD;  Location: [facility] ENDOSCOPY;  Service: Cardiovascular;  Laterality: N/A;  . CARDIOVERSION N/A 06/13/2018   Procedure: CARDIOVERSION;  Surgeon: Wendall Stade, MD;  Location: [facility] ENDOSCOPY;  Service: Cardiovascular;  Laterality: N/A;  . CARDIOVERSION N/A 06/23/2018   Procedure: CARDIOVERSION;  Surgeon: Yates Decamp, MD;  Location: [facility] ENDOSCOPY;  Service: Cardiovascular;  Laterality: N/A;  . CARDIOVERSION N/A 03/11/2020   Procedure: CARDIOVERSION;  Surgeon: Yates Decamp, MD;  Location: [facility] ENDOSCOPY;  Service: Cardiovascular;  Laterality: N/A;  . excision of actinic keratosis    . EYE  SURGERY     eye lift  . IR CT HEAD LTD  01/27/2020  . IR PERCUTANEOUS ART THROMBECTOMY/INFUSION INTRACRANIAL INC DIAG ANGIO  01/27/2020      . IR PERCUTANEOUS ART THROMBECTOMY/INFUSION INTRACRANIAL INC DIAG ANGIO  01/27/2020  . RADIOLOGY WITH ANESTHESIA N/A 01/27/2020   Procedure: IR WITH ANESTHESIA;  Surgeon: Radiologist, Medication, MD;  Location: [facility] OR;  Service: Radiology;  Laterality: N/A;  . TEE WITHOUT CARDIOVERSION N/A 03/11/2020   Procedure: TRANSESOPHAGEAL ECHOCARDIOGRAM (TEE);  Surgeon: Yates Decamp, MD;  Location: [facility] ENDOSCOPY;  Service: Cardiovascular;  Laterality: N/A;  . TONSILLECTOMY    . WISDOM TOOTH EXTRACTION      There were no vitals filed for this visit.   Subjective Assessment - 06/03/20 0848    Subjective Pt did get casted for AFO. Should be 3 weeks until he gets it.    Patient is accompained by: Family member   sister   Pertinent History PMH: HTN, paroxysmal a fib    Limitations Standing;Walking    Patient Stated Goals "wants everything working on R side again"    Currently in Pain? No/denies                             [facility] Adult PT Treatment/Exercise - 06/03/20 0850      Bed Mobility   Bed Mobility Supine to Sit;Sit to Supine    Supine to Sit Supervision/Verbal cueing    Sit to Supine Supervision/Verbal cueing  Transfers   Transfers Sit to Stand;Stand to [employer] Transfers    Sit to Stand 5: Supervision;4: Min guard    Sit to Stand Details Verbal cues for technique    Sit to Stand Details (indicate cue type and reason) Pt was cued to be sure that right foot is positioned correctly prior to standing. Also cued to not pull on walker but push from seat.    Five time sit to stand comments  17.98 sec from mat with hand supervision    Stand to Sit 5: Supervision    Stand to Sit Details Verbal cues to remove hand prior to sitting.    Squat Pivot Transfers 4: Min guard    Squat Pivot Transfer Details (indicate cue type and reason)  from w/c to/from mat with RW. Pt was cued to try to focus on right foot placement and putting some weight through it as tends to just pivot around on LLE even with AFO donned.      Ambulation/Gait   Ambulation/Gait Yes    Ambulation/Gait Assistance 4: Min assist    Ambulation/Gait Assistance Details Pt was given min assist at times throughout gait to help with RLE advancement to inrease right hip flexion some and keep foot straight. Pt did have less ER noted today.    Ambulation Distance (Feet) 230 Feet    Assistive device Rolling walker   right hand grip attachment, right AFO with t strap   Gait Pattern Step-through pattern;Decreased stance time - right;Decreased hip/knee flexion - right;Narrow base of support    Ambulation Surface Level;Indoor      Timed Up and Go Test   TUG Normal TUG    Normal TUG (seconds) 27   27.5 and 26.5 sec with RW     Exercises   Exercises Other Exercises    Other Exercises  PT reviewed HEP as noted below from [place]                  PT Education - 06/03/20 1030    Education Details Educated on results of testing as well as recert plan. Added standing activities to HEP at counter and reissued HEP    Person(s) Educated Patient;Other (comment)   brother-in-law, [name]   Methods Explanation;Demonstration;Handout    Comprehension Verbalized understanding;Returned demonstration            PT Short Term Goals - 05/17/20 2019      PT SHORT TERM GOAL #1   Title Pt will perform sit <> stand with RW with supervision in order to demo decr caregiver burden and improved functional BLE strength/balance. ALL STGS DUE 05/18/20    Baseline 05/17/20 supervision/CGA    Time 4    Period Weeks    Status Partially Met    Target Date 05/18/20      PT SHORT TERM GOAL #2   Title Pt will perform sit > supine with supervision and supine > sit with min guard for bed mobility in order to decr caregiver burden.    Baseline 04/15/20 supervision sit to supine, min assist  supine to sit as did not get right leg off mat, 05/17/20 supervision    Time 4    Period Weeks    Status Achieved      PT SHORT TERM GOAL #3   Title Pt will undergo assessment of AFO for functional transfers and gait.    Baseline Orthotist has come and determined custom AFO need.    Time 4  Period Weeks    Status Achieved      PT SHORT TERM GOAL #4   Title Pt will ambulate 53' with RW with mod A x1 in order to improve functional mobility.    Baseline 04/15/20 Mod assist with 2nd person CGA 230' with RW. Ability does vary. 05/17/20 230' min assist with RW and right AFO    Time 4    Period Weeks    Status Achieved      PT SHORT TERM GOAL #6   Title Pt will perform squat pivot transfers from w/c <> mat table with supervision.    Baseline CGA w/c to/from mat 04/15/20    Time 4    Period Weeks    Status Revised             PT Long Term Goals - 06/03/20 1032      PT LONG TERM GOAL #1   Title Pt and pt's family members will be independent with final HEP in order to build upon functional gains made in therapy. ALL LTGS DUE 06/08/20    Baseline Pt has been performing current HEP with family but PT continues to add to it 06/03/20    Time 12    Period Weeks    Status On-going      PT LONG TERM GOAL #2   Title Pt will ambulate 115' with min guard/min A with RW and R AFO in order to improve household mobility.    Baseline 230' with RW with right hand grip attachment and R AFO min assist on 06/03/20    Time 12    Period Weeks    Status Achieved      PT LONG TERM GOAL #3   Title Pt will perform sit <> stand with RW with min guard in order to improve functional BLE strength for transfers.    Baseline 06/03/20 supervision/CGA with sit to stand at RW from w/c and mat    Time 12    Period Weeks    Status Achieved      PT LONG TERM GOAL #4   Title Pt will perform squat pivot transfer with mod I vs. stand pivot transfer with RW with min guard in order to decr caregiver burden.     Baseline CGA with RW with right AFO donned stand/pivot transfer.    Time 12    Period Weeks    Status Achieved      PT LONG TERM GOAL #5   Title Pt will decrease TUG from 29.5 sec to <25 sec with RW for improved balance and decreased fall risk.    Baseline 05/17/20 29.5 sec with RW, 06/03/20 27 sec with RW    Time 4    Period Weeks    Status Not Met           Updated goals:  PT Short Term Goals - 06/03/20 1050      PT SHORT TERM GOAL #1   Title Pt will be mod I with all transfers with use of right AFO once he receives it for improved mobility.    Baseline 05/17/20 supervision/CGA    Time 4    Period Weeks    Status Revised    Target Date 07/03/20      PT SHORT TERM GOAL #2   Title Pt will be able to ambulate > 345' with RW with right AFO and hand grip attachment min assist for improved mobility.    Baseline 230' min assist with  RW and right AFO on 06/03/20    Time 4    Period Weeks    Status New    Target Date 07/03/20      PT SHORT TERM GOAL #3   Title Pt will decrease 5 x sit to stand from 17.98 sec to <15 sec for improved balance and functional strength.    Baseline 06/03/20 17.98 sec from mat with hand    Time 4    Period Weeks    Status New    Target Date 07/03/20           PT Long Term Goals - 06/03/20 1057      PT LONG TERM GOAL #1   Title Pt and pt's family members will be independent with final HEP in order to build upon functional gains made in therapy. ALL LTGS DUE 09/01/20    Baseline Pt has been performing current HEP with family but PT continues to add to it 06/03/20    Time 12    Period Weeks    Status On-going    Target Date 09/01/20      PT LONG TERM GOAL #2   Title Pt will ambulate >500' on varied surfaces supervision with RW and R AFO for improved household mobility and short community distances.    Baseline 230' with RW with right hand grip attachment and R AFO min assist on 06/03/20    Time 12    Period Weeks    Status Revised    Target  Date 09/01/20      PT LONG TERM GOAL #3   Title Pt will decrease TUG from 27 sec to <20 sec for improved balance and decreased fall risk.    Baseline 06/03/20 TUG=27 sec with RW    Time 12    Period Weeks    Status New    Target Date 09/01/20      PT LONG TERM GOAL #4   Title Pt will be able to ambulate up/down curb with RW and R AFO CGA for improved community access.    Time 12    Period Weeks    Status New    Target Date 09/01/20                Plan - 06/03/20 1035    Clinical Impression Statement Pt continues to make progress towards all goals and has met transfer goals at walker at [facility] level with right AFO donned. Pt has been casted for R AFO and should be receiving in about 3 weeks. He continues to utilize air caste with standing at home with family to protect right ankle. Pt showing improving right foot placement today with gait with less ER. PT did assist to try to get more right hip flexion and maintain slightly wider BOS at times as tends to adduct right leg. PT reassess TUG which was 27 sec indicating fall risk but is showing decreased time from when assessed a 2 weeks prior. He is also fall risk based on 5 x sit to stand with score of 17.98 sec. He is showing improving functional strength with more muscle activation in right leg especially in quads. Pt continues to benefit from skilled PT to continue to progress strength, balance and functional mobility to maximize function.    Personal Factors and Comorbidities Comorbidity 3+    Comorbidities dense L MCA CVA, paroxysmal A. fib on chronic Xarelto, nonischemic cardiomyopathy and hypertension.    Examination-Activity Limitations Bathing;Bed Mobility;Bend;Dressing;Hygiene/Grooming;Stand;Squat;Locomotion Level;Transfers  Examination-Participation Restrictions Community Activity;Yard Work;Shop   walking his dog, works as an Electrical engineer Evolving/Moderate complexity    Rehab  Potential Good    PT Frequency 2x / week    PT Duration 12 weeks    PT Treatment/Interventions ADLs/Self Care Home Management;Aquatic Therapy;DME Instruction;Gait training;Stair training;Functional mobility training;Therapeutic activities;Therapeutic exercise;Electrical Stimulation;Balance training;Neuromuscular re-education;Wheelchair mobility training;Orthotic Fit/Training;Patient/family education;Passive range of motion;Energy conservation    PT Next Visit Plan I did recert last visit for 12 weeks. Pt should be getting AFO in about 3 weeks. PT did not attempt tall kneeling as pt reporting pain in right knee when last performed. Will have to see if can try again in future. Continue with RLE strengthening, weight shifting/NMR to RLE. continue gait training with RW, AFO with t-strap and hand orthotic.    Consulted and Agree with Plan of Care Patient;Family member/caregiver    Family Member Consulted brother-in-law, [name]           Patient will benefit from skilled therapeutic intervention in order to improve the following deficits and impairments:  Abnormal gait, Decreased activity tolerance, Decreased balance, Decreased cognition, Decreased mobility, Decreased coordination, Decreased range of motion, Decreased endurance, Decreased strength, Hypomobility, Difficulty walking, Impaired UE functional use, Impaired vision/preception, Postural dysfunction, Impaired sensation  Visit Diagnosis: Other abnormalities of gait and mobility  Muscle weakness (generalized)  Hemiplegia and hemiparesis following cerebral infarction affecting right dominant side (HCC)  Unsteadiness on feet     Problem List Patient Active Problem List   Diagnosis Date Noted  . Abnormality of gait 04/28/2020  . Neurogenic bladder 03/17/2020  . Sepsis due to pneumonia ([place]) 03/06/2020  . History of stroke with residual deficit 03/06/2020  . Transient hypotension 03/06/2020  . Spastic hemiplegia affecting nondominant  side ([place])   . History of hypertension   . Expressive aphasia   . AKI (acute kidney injury) ([place])   . Global aphasia   . Hypoalbuminemia due to protein-calorie malnutrition ([place])   . Dysphagia, post-stroke   . Hyperlipidemia 02/02/2020  . Dysphagia following cerebral infarction 02/02/2020  . Aspiration pneumonia ([place]) 02/02/2020  . Acute ischemic left MCA stroke ([place]) 02/02/2020  . Acute respiratory failure ([place])   . Acute ischemic stroke [facility]) s/p clot retrieval L MCA & ACA A3 01/27/2020  . Aspiration into airway 01/27/2020  . Chronic anticoagulation 01/27/2020  . Paroxysmal atrial fibrillation ([place]) 06/15/2018  . Essential hypertension 06/15/2018  . NICM (nonischemic cardiomyopathy) ([place]) 06/15/2018  . Visit for monitoring Tikosyn therapy 06/12/2018    Electa Sniff, PT, DPT, NCS 06/03/2020, 10:47 AM  [facility] [address] [place], [place], [address] Phone: [phone]   Fax:  [phone]  Name: TARAS RASK MRN: [id number] Date of Birth: 09/16/1955

## 2020-06-03 NOTE — Patient Instructions (Addendum)
Access Code: [email or web address] URL: [email or web address] Date: 06/03/2020 Prepared by: Cherly Anderson  Exercises Supine Hip and Knee Flexion PROM with Caregiver - 2 x daily - 5 x weekly - 2 sets - 10 reps Supine Bridge - 1 x daily - 5 x weekly - 2 sets - 10 reps Supine Knee Extension Strengthening - 1 x daily - 5 x weekly - 2 sets - 10 reps Supine Hip Internal and External Rotation - 2 x daily - 7 x weekly - 2 sets - 10 reps Supine Hip Adductor Stretch with Caregiver - 1 x daily - 7 x weekly - 3 sets - 30 hold Seated Hip Adduction Isometrics with Ball - 3 x daily - 7 x weekly - 2 sets - 10 reps - 5 hold Hooklying Single Leg Bent Knee Fallouts with Resistance - 1 x daily - 7 x weekly - 2 sets - 10 reps Side to Side Weight Shift with Counter Support - 2 x daily - 7 x weekly - 2 sets - 10 reps Mini Squat with Counter Support - 2 x daily - 7 x weekly - 2 sets - 10 reps

## 2020-06-07 ENCOUNTER — Encounter: Payer: Self-pay | Admitting: Occupational Therapy

## 2020-06-07 ENCOUNTER — Ambulatory Visit: Payer: BC Managed Care – PPO | Admitting: Physical Therapy

## 2020-06-07 ENCOUNTER — Ambulatory Visit: Payer: BC Managed Care – PPO

## 2020-06-07 ENCOUNTER — Encounter: Payer: Self-pay | Admitting: Physical Therapy

## 2020-06-07 ENCOUNTER — Other Ambulatory Visit: Payer: Self-pay

## 2020-06-07 ENCOUNTER — Ambulatory Visit: Payer: BC Managed Care – PPO | Admitting: Occupational Therapy

## 2020-06-07 DIAGNOSIS — R2689 Other abnormalities of gait and mobility: Secondary | ICD-10-CM | POA: Diagnosis not present

## 2020-06-07 DIAGNOSIS — R278 Other lack of coordination: Secondary | ICD-10-CM

## 2020-06-07 DIAGNOSIS — R29818 Other symptoms and signs involving the nervous system: Secondary | ICD-10-CM

## 2020-06-07 DIAGNOSIS — R2681 Unsteadiness on feet: Secondary | ICD-10-CM

## 2020-06-07 DIAGNOSIS — M6281 Muscle weakness (generalized): Secondary | ICD-10-CM

## 2020-06-07 DIAGNOSIS — I69351 Hemiplegia and hemiparesis following cerebral infarction affecting right dominant side: Secondary | ICD-10-CM

## 2020-06-07 DIAGNOSIS — R600 Localized edema: Secondary | ICD-10-CM

## 2020-06-07 DIAGNOSIS — R4701 Aphasia: Secondary | ICD-10-CM

## 2020-06-07 DIAGNOSIS — R482 Apraxia: Secondary | ICD-10-CM

## 2020-06-07 NOTE — Therapy (Signed)
[facility] [address] [place], [place], [address] Phone: [phone]   Fax:  [phone]  Physical Therapy Treatment  Patient Details  Name: Dustin Schneider MRN: [id number] Date of Birth: 1956/07/03 Referring Provider (PT): Lauraine Rinne, PA-C (followed by Dr. Delice Lesch)   Encounter Date: 06/07/2020   PT End of Session - 06/07/20 1033    Visit Number 25    Number of Visits 48    Date for PT Re-Evaluation 09/01/20    Authorization Type BCBS    PT Start Time 0931    PT Stop Time 1014    PT Time Calculation (min) 43 min    Equipment Utilized During Treatment Gait belt    Activity Tolerance Patient tolerated treatment well    Behavior During Therapy [facility] for tasks assessed/performed           Past Medical History:  Diagnosis Date  . Hypertension   . NICM (nonischemic cardiomyopathy) ([place]) 06/15/2018   04/23/18: EF 45%, 09/15/18: NOrmal LVEF  . Paroxysmal atrial fibrillation ([place]) 06/15/2018  . Stroke ([place])   . Visit for monitoring Tikosyn therapy 06/12/2018    Past Surgical History:  Procedure Laterality Date  . BUBBLE STUDY  03/11/2020   Procedure: BUBBLE STUDY;  Surgeon: [name], MD;  Location: [place];  Service: Cardiovascular;;  . CARDIOVERSION N/A 05/13/2018   Procedure: CARDIOVERSION;  Surgeon: [name], MD;  Location: [facility] ENDOSCOPY;  Service: Cardiovascular;  Laterality: N/A;  . CARDIOVERSION N/A 06/13/2018   Procedure: CARDIOVERSION;  Surgeon: [name], MD;  Location: [facility] ENDOSCOPY;  Service: Cardiovascular;  Laterality: N/A;  . CARDIOVERSION N/A 06/23/2018   Procedure: CARDIOVERSION;  Surgeon: [name], MD;  Location: [facility] ENDOSCOPY;  Service: Cardiovascular;  Laterality: N/A;  . CARDIOVERSION N/A 03/11/2020   Procedure: CARDIOVERSION;  Surgeon: [name], MD;  Location: [place];  Service: Cardiovascular;  Laterality: N/A;  . excision of actinic keratosis    . EYE SURGERY      eye lift  . IR CT HEAD LTD  01/27/2020  . IR PERCUTANEOUS ART THROMBECTOMY/INFUSION INTRACRANIAL INC DIAG ANGIO  01/27/2020      . IR PERCUTANEOUS ART THROMBECTOMY/INFUSION INTRACRANIAL INC DIAG ANGIO  01/27/2020  . RADIOLOGY WITH ANESTHESIA N/A 01/27/2020   Procedure: IR WITH ANESTHESIA;  Surgeon: Radiologist, Medication, MD;  Location: [place];  Service: Radiology;  Laterality: N/A;  . TEE WITHOUT CARDIOVERSION N/A 03/11/2020   Procedure: TRANSESOPHAGEAL ECHOCARDIOGRAM (TEE);  Surgeon: [name], MD;  Location: [facility];  Service: Cardiovascular;  Laterality: N/A;  . TONSILLECTOMY    . WISDOM TOOTH EXTRACTION      There were no vitals filed for this visit.   Subjective Assessment - 06/07/20 0933    Subjective No changes, doing well today.    Patient is accompained by: Family member   sister   Pertinent History PMH: HTN, paroxysmal a fib    Limitations Standing;Walking    Patient Stated Goals "wants everything working on R side again"    Currently in Pain? No/denies                             [facility] Adult PT Treatment/Exercise - 06/07/20 0950      Bed Mobility   Bed Mobility Supine to Sit;Sit to Supine    Supine to Sit Supervision/Verbal cueing    Sit to Supine Supervision/Verbal cueing      Transfers   Transfers Sit  to Stand;Stand to Sit;Stand Pivot Transfers    Sit to Stand 5: Supervision;4: Min guard    Sit to Stand Details Verbal cues for technique    Sit to Stand Details (indicate cue type and reason) cued to make sure that RLE is positioned properly and with pt having a more wide BOS    Stand to Sit 5: Supervision    Squat Pivot Transfers 4: Min guard    Squat Pivot Transfer Details (indicate cue type and reason) from w/c > mat, cues for R foot placement and weight bearing through R leg during transfer vs. just pivoting on foot      Ambulation/Gait   Ambulation/Gait Yes    Ambulation/Gait Assistance 4: Min assist    Ambulation/Gait Assistance Details  given min A at times to help with R hip flexion during swing phase and proper foot placement on RLE, pt having incr RLE shaking/tremors at times during swing phase today     Ambulation Distance (Feet) 230 Feet   x1   Assistive device Rolling walker   R hand orthosis, R ottobock with T strap   Gait Pattern Step-through pattern;Decreased stance time - right;Decreased hip/knee flexion - right;Narrow base of support    Ambulation Surface Level;Indoor      Knee/Hip Exercises: Standing   Other Standing Knee Exercises standing in RW with BUE support in RW: x10 reps R hip flexion to 4" step with min A from therapist      Knee/Hip Exercises: Seated   Abduction/Adduction  10 reps      Knee/Hip Exercises: Supine   Short Arc Quad Sets Strengthening;Right;2 sets;10 reps    Short Arc Quad Sets Limitations over bolster, first set with 3# ankle weight, 2nd set with 4# - cues for holding at end of ROM for approx. 3  seconds    Bridges Strengthening;1 set;10 reps    Bridges Limitations with ball squeeze for hip ADD, tactile cues over R ASIS for incr hip extension    Single Leg Bridge Strengthening;Right;2 sets;5 reps   therapist assisting with R foot placement   Other Supine Knee/Hip Exercises in hooklying position: therapist resisted hip ADD/hip ABD on R x10 reps, then performing resisted R hip flexion 2 x 10 reps - verbal and manual cues for slowed and controlled lowering back down to mat                    PT Short Term Goals - 06/03/20 1050      PT SHORT TERM GOAL #1   Title Pt will be mod I with all transfers with use of right AFO once he receives it for improved mobility.    Baseline 05/17/20 supervision/CGA    Time 4    Period Weeks    Status Revised    Target Date 07/03/20      PT SHORT TERM GOAL #2   Title Pt will be able to ambulate > 345' with RW with right AFO and hand grip attachment min assist for improved mobility.    Baseline 230' min assist with RW and right AFO on 06/03/20     Time 4    Period Weeks    Status New    Target Date 07/03/20      PT SHORT TERM GOAL #3   Title Pt will decrease 5 x sit to stand from 17.98 sec to <15 sec for improved balance and functional strength.    Baseline 06/03/20 17.98 sec from mat with hand  Time 4    Period Weeks    Status New    Target Date 07/03/20             PT Long Term Goals - 06/03/20 1057      PT LONG TERM GOAL #1   Title Pt and pt's family members will be independent with final HEP in order to build upon functional gains made in therapy. ALL LTGS DUE 09/01/20    Baseline Pt has been performing current HEP with family but PT continues to add to it 06/03/20    Time 12    Period Weeks    Status On-going    Target Date 09/01/20      PT LONG TERM GOAL #2   Title Pt will ambulate >500' on varied surfaces supervision with RW and R AFO for improved household mobility and short community distances.    Baseline 230' with RW with right hand grip attachment and R AFO min assist on 06/03/20    Time 12    Period Weeks    Status Revised    Target Date 09/01/20      PT LONG TERM GOAL #3   Title Pt will decrease TUG from 27 sec to <20 sec for improved balance and decreased fall risk.    Baseline 06/03/20 TUG=27 sec with RW    Time 12    Period Weeks    Status New    Target Date 09/01/20      PT LONG TERM GOAL #4   Title Pt will be able to ambulate up/down curb with RW and R AFO CGA for improved community access.    Time 12    Period Weeks    Status New    Target Date 09/01/20                 Plan - 06/07/20 1034    Clinical Impression Statement Focus of today's skilled session was RLE stengthening and gait training with R hand orthosis with R AFO and continued use of T strap for ankle stability. Pt improving with gait and demonstrating less of a narrow BOS, does continue to need min A at times for improved R foot placement. Pt demonstrated RLE shaking/muscle tremors during swing phase of gait  towards end of session, most likely due to incr muscle fatigue from supine exercises. Will continue to progress towards LTGs.    Personal Factors and Comorbidities Comorbidity 3+    Comorbidities dense L MCA CVA, paroxysmal A. fib on chronic Xarelto, nonischemic cardiomyopathy and hypertension.    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, works as an Electrical engineer Evolving/Moderate complexity    Rehab Potential Good    PT Frequency 2x / week    PT Duration 12 weeks    PT Treatment/Interventions ADLs/Self Care Home Management;Aquatic Therapy;DME Instruction;Gait training;Stair training;Functional mobility training;Therapeutic activities;Therapeutic exercise;Electrical Stimulation;Balance training;Neuromuscular re-education;Wheelchair mobility training;Orthotic Fit/Training;Patient/family education;Passive range of motion;Energy conservation    PT Next Visit Plan Pt should be getting AFO in about 3 weeks. PT did not attempt tall kneeling as pt reporting pain in right knee when last performed. Will have to see if can try again in future. Continue with RLE strengthening, weight shifting/NMR to RLE. continue gait training with RW, AFO with t-strap and hand orthotic.    Consulted and Agree with Plan of Care Patient;Family member/caregiver    Family Member Consulted brother-in-law, [name]  Patient will benefit from skilled therapeutic intervention in order to improve the following deficits and impairments:  Abnormal gait, Decreased activity tolerance, Decreased balance, Decreased cognition, Decreased mobility, Decreased coordination, Decreased range of motion, Decreased endurance, Decreased strength, Hypomobility, Difficulty walking, Impaired UE functional use, Impaired  vision/preception, Postural dysfunction, Impaired sensation  Visit Diagnosis: Hemiplegia and hemiparesis following cerebral infarction affecting right dominant side (HCC)  Muscle weakness (generalized)  Other lack of coordination  Unsteadiness on feet  Other abnormalities of gait and mobility     Problem List Patient Active Problem List   Diagnosis Date Noted  . Abnormality of gait 04/28/2020  . Neurogenic bladder 03/17/2020  . Sepsis due to pneumonia ([place]) 03/06/2020  . History of stroke with residual deficit 03/06/2020  . Transient hypotension 03/06/2020  . Spastic hemiplegia affecting nondominant side ([place])   . History of hypertension   . Expressive aphasia   . AKI (acute kidney injury) ([place])   . Global aphasia   . Hypoalbuminemia due to protein-calorie malnutrition ([place])   . Dysphagia, post-stroke   . Hyperlipidemia 02/02/2020  . Dysphagia following cerebral infarction 02/02/2020  . Aspiration pneumonia ([place]) 02/02/2020  . Acute ischemic left MCA stroke ([place]) 02/02/2020  . Acute respiratory failure ([place])   . Acute ischemic stroke [facility]) s/p clot retrieval L MCA & ACA A3 01/27/2020  . Aspiration into airway 01/27/2020  . Chronic anticoagulation 01/27/2020  . Paroxysmal atrial fibrillation ([place]) 06/15/2018  . Essential hypertension 06/15/2018  . NICM (nonischemic cardiomyopathy) ([place]) 06/15/2018  . Visit for monitoring Tikosyn therapy 06/12/2018    Arliss Journey, PT, DPT  06/07/2020, 10:36 AM  [facility] [address] [place], [place], [address] Phone: [phone]   Fax:  [phone]  Name: Dustin Schneider MRN: [id number] Date of Birth: [birth year]

## 2020-06-07 NOTE — Therapy (Addendum)
[facility] [address] [place], [place], [address] Phone: [phone]   Fax:  [phone]  Occupational Therapy Treatment  Patient Details  Name: Dustin Schneider MRN: [id number] Date of Birth: 09-30-1955 No data recorded  Encounter Date: 06/07/2020   OT End of Session - 06/07/20 0911    Visit Number 22    Number of Visits 25    Date for OT Re-Evaluation 06/23/20    Authorization Type BC/BS - No auth required, covered 100%    OT Start Time 0848    OT Stop Time 0928    OT Time Calculation (min) 40 min           Past Medical History:  Diagnosis Date  . Hypertension   . NICM (nonischemic cardiomyopathy) ([place]) 06/15/2018   04/23/18: EF 45%, 09/15/18: NOrmal LVEF  . Paroxysmal atrial fibrillation ([place]) 06/15/2018  . Stroke ([place])   . Visit for monitoring Tikosyn therapy 06/12/2018    Past Surgical History:  Procedure Laterality Date  . BUBBLE STUDY  03/11/2020   Procedure: BUBBLE STUDY;  Surgeon: [name], MD;  Location: [place];  Service: Cardiovascular;;  . CARDIOVERSION N/A 05/13/2018   Procedure: CARDIOVERSION;  Surgeon: [name], MD;  Location: [facility] ENDOSCOPY;  Service: Cardiovascular;  Laterality: N/A;  . CARDIOVERSION N/A 06/13/2018   Procedure: CARDIOVERSION;  Surgeon: [name], MD;  Location: [facility] ENDOSCOPY;  Service: Cardiovascular;  Laterality: N/A;  . CARDIOVERSION N/A 06/23/2018   Procedure: CARDIOVERSION;  Surgeon: [name], MD;  Location: [facility] ENDOSCOPY;  Service: Cardiovascular;  Laterality: N/A;  . CARDIOVERSION N/A 03/11/2020   Procedure: CARDIOVERSION;  Surgeon: [name], MD;  Location: [place];  Service: Cardiovascular;  Laterality: N/A;  . excision of actinic keratosis    . EYE SURGERY     eye lift  . IR CT HEAD LTD  01/27/2020  . IR PERCUTANEOUS ART THROMBECTOMY/INFUSION INTRACRANIAL INC DIAG ANGIO  01/27/2020      . IR PERCUTANEOUS ART THROMBECTOMY/INFUSION INTRACRANIAL INC  DIAG ANGIO  01/27/2020  . RADIOLOGY WITH ANESTHESIA N/A 01/27/2020   Procedure: IR WITH ANESTHESIA;  Surgeon: Radiologist, Medication, MD;  Location: [facility];  Service: Radiology;  Laterality: N/A;  . TEE WITHOUT CARDIOVERSION N/A 03/11/2020   Procedure: TRANSESOPHAGEAL ECHOCARDIOGRAM (TEE);  Surgeon: [name], MD;  Location: [facility];  Service: Cardiovascular;  Laterality: N/A;  . TONSILLECTOMY    . WISDOM TOOTH EXTRACTION      There were no vitals filed for this visit.   Subjective Assessment - 06/07/20 0910    Subjective  Denies pain, reports splint is working well    Pertinent History Lt MCA CVA 01/27/20, readmitted to hospital with HAP and fever on 03/06/20 and released 03/11/20. PMH: paroxsymal A-fib, HTN    Limitations fall    Currently in Pain? No/denies                  Treatment: Supine closed chain shoulder flexion horizontal abduction, chest press then triceps extension, with min-mod facilitation for proper positioning and to avoid compensation.  Lower trunk rotation for gentle stretch. Seated edge of mat weightbearing through RUE with body on arm movements, min facilitation Low and mid range closed chain shoulder flexion with min facilitation/ v.c to avoid compensation with RUE. Wrist cock up splint is fitting well without any issues.                OT Short Term Goals - 05/05/20 1125  OT SHORT TERM GOAL #1   Title Independent with initial HEP    Time 4    Period Weeks    Status Achieved      OT SHORT TERM GOAL #2   Title Pt to perform low to mid level reaching of light objects RUE in prep for feeding self and bathing    Time 4    Period Weeks    Status Achieved   04/20/20     OT SHORT TERM GOAL #3   Title Pt to feed self 50% of the time with Rt dominant hand and A/E prn    Time 4    Period Weeks    Status On-going   04/20/20: able to eat finger foods     OT SHORT TERM GOAL #4   Title Pt to perform UE dressing of pullover shirt mod I  level    Time 4    Period Weeks    Status Achieved   met per pt and brother     OT SHORT TERM GOAL #5   Title Pt to perform LE dressing and all bathing with no more than mod assist using A/E and strategies prn    Time 4    Period Weeks    Status Achieved   04/20/20:  set-up for bedside bath, mod A for LE dressing     OT SHORT TERM GOAL #6   Title Pt to perform toileting and toilet transfers with no more than min assist overall    Time 4    Period Weeks    Status On-going      OT SHORT TERM GOAL #7   Title Improve functional use of RUE as evidenced by performing 8 blocks on Box & Blocks test    Baseline 0    Time 4    Period Weeks    Status Achieved   04/20/20:  6 blocks, 05/05/20: 12 blocks            OT Long Term Goals - 05/05/20 1126      OT LONG TERM GOAL #1   Title Pt to be independent with updated HEP    Time 12    Period Weeks    Status New      OT LONG TERM GOAL #2   Title Pt to perform all BADLS at mod I level    Time 12    Period Weeks    Status On-going      OT LONG TERM GOAL #3   Title Pt to feed self 75% or greater with Rt dominant hand and grooming 50% or more with Rt hand    Time 12    Period Weeks    Status New      OT LONG TERM GOAL #4   Title Pt to retrieve light weight objects from high shelf RUE consistently    Time 12    Period Weeks    Status New      OT LONG TERM GOAL #5   Title Pt to demo 25 lbs grip strength or more Rt dominant hand for gripping, opening jars/containers    Time 12    Period Weeks    Status On-going   12.5 lbs     OT LONG TERM GOAL #6   Title Pt to perform simple meal prep and light cleaning tasks mod I level    Time 12    Period Weeks    Status New  OT LONG TERM GOAL #7   Title Improve functional use RUE as evidenced by performing 25 blocks on Box & Blocks and performing 9 hole peg test in 90 sec or under    Time 12    Period Weeks    Status On-going   12 blocks     OT LONG TERM GOAL #8   Title Pt to  returned to modified art/sculpting tasks with task modifications and A/E prn    Time 12    Period Weeks    Status New                  Patient will benefit from skilled therapeutic intervention in order to improve the following deficits and impairments:           Visit Diagnosis: Muscle weakness (generalized)  Hemiplegia and hemiparesis following cerebral infarction affecting right dominant side (HCC)  Other lack of coordination    Problem List Patient Active Problem List   Diagnosis Date Noted  . Abnormality of gait 04/28/2020  . Neurogenic bladder 03/17/2020  . Sepsis due to pneumonia ([place]) 03/06/2020  . History of stroke with residual deficit 03/06/2020  . Transient hypotension 03/06/2020  . Spastic hemiplegia affecting nondominant side ([place])   . History of hypertension   . Expressive aphasia   . AKI (acute kidney injury) ([place])   . Global aphasia   . Hypoalbuminemia due to protein-calorie malnutrition ([place])   . Dysphagia, post-stroke   . Hyperlipidemia 02/02/2020  . Dysphagia following cerebral infarction 02/02/2020  . Aspiration pneumonia ([place]) 02/02/2020  . Acute ischemic left MCA stroke ([place]) 02/02/2020  . Acute respiratory failure ([place])   . Acute ischemic stroke [facility]) s/p clot retrieval L MCA & ACA A3 01/27/2020  . Aspiration into airway 01/27/2020  . Chronic anticoagulation 01/27/2020  . Paroxysmal atrial fibrillation ([place]) 06/15/2018  . Essential hypertension 06/15/2018  . NICM (nonischemic cardiomyopathy) ([place]) 06/15/2018  . Visit for monitoring Tikosyn therapy 06/12/2018    Murel Shenberger 06/07/2020, 9:14 AM  [facility] [address] [place], [place], [address] Phone: [phone]   Fax:  [phone]  Name: MACOY RODWELL MRN: [id number] Date of Birth: 1955/10/11

## 2020-06-07 NOTE — Therapy (Signed)
[facility] [address] [place], [place], [address] Phone: [phone]   Fax:  [phone]  Speech Language Pathology Treatment  Patient Details  Name: Dustin Schneider MRN: [id number] Date of Birth: Jan 03, 1956 Referring Provider (SLP): Dr. Delice Lesch   Encounter Date: 06/07/2020   End of Session - 06/07/20 1659    Visit Number 21    Number of Visits 25    Date for SLP Re-Evaluation 06/15/20    SLP Start Time 39    SLP Stop Time  1102    SLP Time Calculation (min) 42 min    Activity Tolerance Patient tolerated treatment well           Past Medical History:  Diagnosis Date  . Hypertension   . NICM (nonischemic cardiomyopathy) ([place]) 06/15/2018   04/23/18: EF 45%, 09/15/18: NOrmal LVEF  . Paroxysmal atrial fibrillation ([place]) 06/15/2018  . Stroke ([place])   . Visit for monitoring Tikosyn therapy 06/12/2018    Past Surgical History:  Procedure Laterality Date  . BUBBLE STUDY  03/11/2020   Procedure: BUBBLE STUDY;  Surgeon: [name], MD;  Location: [place];  Service: Cardiovascular;;  . CARDIOVERSION N/A 05/13/2018   Procedure: CARDIOVERSION;  Surgeon: [name], MD;  Location: [facility] ENDOSCOPY;  Service: Cardiovascular;  Laterality: N/A;  . CARDIOVERSION N/A 06/13/2018   Procedure: CARDIOVERSION;  Surgeon: [name], MD;  Location: [facility] ENDOSCOPY;  Service: Cardiovascular;  Laterality: N/A;  . CARDIOVERSION N/A 06/23/2018   Procedure: CARDIOVERSION;  Surgeon: [name], MD;  Location: [facility] ENDOSCOPY;  Service: Cardiovascular;  Laterality: N/A;  . CARDIOVERSION N/A 03/11/2020   Procedure: CARDIOVERSION;  Surgeon: [name], MD;  Location: [place];  Service: Cardiovascular;  Laterality: N/A;  . excision of actinic keratosis    . EYE SURGERY     eye lift  . IR CT HEAD LTD  01/27/2020  . IR PERCUTANEOUS ART THROMBECTOMY/INFUSION INTRACRANIAL INC DIAG ANGIO  01/27/2020      . IR PERCUTANEOUS ART  THROMBECTOMY/INFUSION INTRACRANIAL INC DIAG ANGIO  01/27/2020  . RADIOLOGY WITH ANESTHESIA N/A 01/27/2020   Procedure: IR WITH ANESTHESIA;  Surgeon: Radiologist, Medication, MD;  Location: [place];  Service: Radiology;  Laterality: N/A;  . TEE WITHOUT CARDIOVERSION N/A 03/11/2020   Procedure: TRANSESOPHAGEAL ECHOCARDIOGRAM (TEE);  Surgeon: [name], MD;  Location: [place];  Service: Cardiovascular;  Laterality: N/A;  . TONSILLECTOMY    . WISDOM TOOTH EXTRACTION      There were no vitals filed for this visit.   Subjective Assessment - 06/07/20 1350    Subjective "Um - (3 seconds) um - (5 seconds) - yoff-hee - (2 seconds) and - ho- urrrt." (coffee and yogurt - for breakfast - but espresso and yogurt is what pt had for breakfast)    Currently in Pain? No/denies                 ADULT SLP TREATMENT - 06/07/20 1353      General Information   Behavior/Cognition Alert;Cooperative;Pleasant mood      Treatment Provided   Treatment provided Cognitive-Linquistic      Cognitive-Linquistic Treatment   Treatment focused on Apraxia;Aphasia    Skilled Treatment Dustin Schneider entered and asked for Lingraphica device from [facility] by pointing to his bag on the back of his wheelchair. SLP asked pt what he had for breakfast and Dustin Schneider stated "S" response. Dustin Schneider req'd min A occasionally for programming new icons faded to min A rarely. He req'd min A for  changing an icon.  SLP educated Dustin Schneider on backing up device to cloud, and re-educated pt on this  technique.       Assessment / Recommendations / Plan   Plan Continue with current plan of care   cancel ST until pt obtains his device     Progression Toward Goals   Progression toward goals Progressing toward goals            SLP Education - 06/07/20 1658    Education Details backing up lingraphica    Person(s) Educated Patient;Caregiver(s)    Methods Explanation;Demonstration    Comprehension Verbalized understanding;Need further instruction             SLP Short Term Goals - 05/09/20 1132      SLP SHORT TERM GOAL #1   Title Pt will name family, friends and art items with occasional min A 8/10 with approximations allowed.    Time 1    Period Weeks    Status Partially Met      SLP SHORT TERM GOAL #2   Title Pt will name 7 items in personally relevant categories with occasional min A over 2 sessions    Time 1    Period Weeks    Status Partially Met      SLP SHORT TERM GOAL #3   Title Pt will perform 4 automatic speech tasks with rare min A.    Time 1    Period Weeks    Status Partially Met      SLP SHORT TERM GOAL #4   Title Pt will write personal information (name/address/phone), 5 family/friend/pet names and 7  professional words with occasional min A    Time 1    Period Weeks    Status Not Met      SLP SHORT TERM GOAL #5   Title Pt will use mulitmodal communication (gesture, draw, write 1st letter etc) to augment verbal expression to meet needs at home with rare min A from family.    Time 1    Period Weeks    Status Partially Met            SLP Long Term Goals - 06/07/20 1700      SLP LONG TERM GOAL #1   Title Pt will verbalize 3 words to explain or describe his artwork and hobbies with occasional min A over 3 sessions, allowing for intellgible approximations    Baseline 05-17-20 (Lingraphica cues), 05-24-20 (lingraphica), 05-31-20 (lingraphica)    Status Achieved      SLP LONG TERM GOAL #2   Title Pt and family will utilize multimodal compensations for aphasia and verbal apraxia to participate in 3 turns each of conversation with occasional min A over 2 sessions    Time 2    Period Weeks    Status On-going      SLP LONG TERM GOAL #3   Title Pt will write 3 word phrases with less than 2 errors with rare min A over 3 sessions    Status Not Met      SLP LONG TERM GOAL #4   Title Reading assessment if indicated    Status Deferred            Plan - 06/07/20 1659    Clinical Impression Statement moderate  Broca's aphasia and severe verbal apraxia persist, affecting communication of basic wants, needs and social interactions. Ongoing training in multimodal communication - Gabriela Giannelli") cont to require less cuing for procedures for basic programming.  When pt receives his communication device, continue skilled ST to maximize communication for QOL, safety and reduce family burden.    Speech Therapy Frequency 2x / week    Duration --   12 weeks or 25 visits   Treatment/Interventions Environmental controls;Cueing hierarchy;SLP instruction and feedback;Compensatory strategies;Functional tasks;Cognitive reorganization;Compensatory techniques;Internal/external aids;Multimodal communcation approach;Patient/family education;Language facilitation    Potential to Achieve Goals Good    Potential Considerations Severity of impairments           Patient will benefit from skilled therapeutic intervention in order to improve the following deficits and impairments:   Verbal apraxia  Aphasia    Problem List Patient Active Problem List   Diagnosis Date Noted  . Abnormality of gait 04/28/2020  . Neurogenic bladder 03/17/2020  . Sepsis due to pneumonia ([place]) 03/06/2020  . History of stroke with residual deficit 03/06/2020  . Transient hypotension 03/06/2020  . Spastic hemiplegia affecting nondominant side ([place])   . History of hypertension   . Expressive aphasia   . AKI (acute kidney injury) ([place])   . Global aphasia   . Hypoalbuminemia due to protein-calorie malnutrition ([place])   . Dysphagia, post-stroke   . Hyperlipidemia 02/02/2020  . Dysphagia following cerebral infarction 02/02/2020  . Aspiration pneumonia ([place]) 02/02/2020  . Acute ischemic left MCA stroke ([place]) 02/02/2020  . Acute respiratory failure ([place])   . Acute ischemic stroke [facility]) s/p clot retrieval L MCA & ACA A3 01/27/2020  . Aspiration into airway 01/27/2020  . Chronic anticoagulation 01/27/2020  . Paroxysmal atrial fibrillation ([place])  06/15/2018  . Essential hypertension 06/15/2018  . NICM (nonischemic cardiomyopathy) ([place]) 06/15/2018  . Visit for monitoring Tikosyn therapy 06/12/2018    [facility] ,[place], [place]  06/07/2020, 5:01 PM  [facility] [address] [place], [place], [address] Phone: [phone]   Fax:  [phone]   Name: Dustin Schneider MRN: [id number] Date of Birth: [birth year]

## 2020-06-09 ENCOUNTER — Ambulatory Visit: Payer: BC Managed Care – PPO | Admitting: Occupational Therapy

## 2020-06-09 ENCOUNTER — Encounter: Payer: Self-pay | Admitting: Physical Therapy

## 2020-06-09 ENCOUNTER — Encounter
Payer: BC Managed Care – PPO | Attending: Physical Medicine & Rehabilitation | Admitting: Physical Medicine & Rehabilitation

## 2020-06-09 ENCOUNTER — Ambulatory Visit: Payer: BC Managed Care – PPO

## 2020-06-09 ENCOUNTER — Other Ambulatory Visit: Payer: Self-pay

## 2020-06-09 ENCOUNTER — Ambulatory Visit: Payer: BC Managed Care – PPO | Admitting: Physical Therapy

## 2020-06-09 ENCOUNTER — Encounter: Payer: Self-pay | Admitting: Occupational Therapy

## 2020-06-09 ENCOUNTER — Telehealth: Payer: Self-pay | Admitting: Physical Therapy

## 2020-06-09 ENCOUNTER — Encounter: Payer: Self-pay | Admitting: Physical Medicine & Rehabilitation

## 2020-06-09 VITALS — BP 119/73 | HR 62 | Temp 98.7°F | Ht 68.0 in

## 2020-06-09 DIAGNOSIS — R29818 Other symptoms and signs involving the nervous system: Secondary | ICD-10-CM

## 2020-06-09 DIAGNOSIS — G8110 Spastic hemiplegia affecting unspecified side: Secondary | ICD-10-CM | POA: Insufficient documentation

## 2020-06-09 DIAGNOSIS — N319 Neuromuscular dysfunction of bladder, unspecified: Secondary | ICD-10-CM | POA: Insufficient documentation

## 2020-06-09 DIAGNOSIS — I639 Cerebral infarction, unspecified: Secondary | ICD-10-CM

## 2020-06-09 DIAGNOSIS — I69351 Hemiplegia and hemiparesis following cerebral infarction affecting right dominant side: Secondary | ICD-10-CM

## 2020-06-09 DIAGNOSIS — R4701 Aphasia: Secondary | ICD-10-CM | POA: Diagnosis present

## 2020-06-09 DIAGNOSIS — M6281 Muscle weakness (generalized): Secondary | ICD-10-CM

## 2020-06-09 DIAGNOSIS — I69391 Dysphagia following cerebral infarction: Secondary | ICD-10-CM | POA: Insufficient documentation

## 2020-06-09 DIAGNOSIS — R2681 Unsteadiness on feet: Secondary | ICD-10-CM

## 2020-06-09 DIAGNOSIS — R2689 Other abnormalities of gait and mobility: Secondary | ICD-10-CM | POA: Diagnosis not present

## 2020-06-09 DIAGNOSIS — R278 Other lack of coordination: Secondary | ICD-10-CM

## 2020-06-09 DIAGNOSIS — I10 Essential (primary) hypertension: Secondary | ICD-10-CM | POA: Insufficient documentation

## 2020-06-09 NOTE — Progress Notes (Signed)
Subjective:    Patient ID: Dustin Schneider, male    DOB: 01-05-1956, 64 y.o.   MRN: [id number]  HPI Male with history of PAF/on Xarelto, and ICM presents for hospital follow up for left MCA infarct.    Last clinic visit on 04/28/20.  Since that time, communication exchanged on several occasions with therapists regarding tone and therapies. He is following up Neurology. He had a new wrist brace fabricated. He was ordered. He is swallowing without difficulty. He is not having issues with urination. He tolerated Baclofen.  Denies falls.  Pain Inventory Average Pain 0 Pain Right Now 0 My pain is right arm & hand spasms.  In the last 24 hours, has pain interfered with the following? General activity 0 Relation with others 0 Enjoyment of life 0 What TIME of day is your pain at its worst? incresed spasms with using the right hand & right side weakness of body. Sleep (in general) Good  Pain is worse with: Spasms when using right hand. Pain improves with: injections and Nothing improves the spasms. Relief from Meds: no pain  Mobility ability to climb steps?  no do you drive?  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  no  Physicians involved in your care Any changes since last visit?  no Primary care Marrian Salvage, [place] Neurologist .   Family History  Problem Relation Age of Onset  . Glaucoma Mother   . Atrial fibrillation Father    Social History   Socioeconomic History  . Marital status: Divorced    Spouse name: Not on file  . Number of children: 1  . Years of education: Not on file  . Highest education level: Not on file  Occupational History  . Not on file  Tobacco Use  . Smoking status: Never Smoker   . Smokeless tobacco: Never Used  Vaping Use  . Vaping Use: Never used  Substance and Sexual Activity  . Alcohol use: Not Currently  . Drug use: No  . Sexual activity: Not on file    Comment: DIVORCE  Other Topics Concern  . 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 Difficulty of Paying Living Expenses: Not on file  Food Insecurity:   . Worried About Charity fundraiser in the Last Year: Not on file  . Ran Out of Food in the Last Year: Not on file  Transportation Needs:   . Lack of Transportation (Medical): Not on file  . Lack of Transportation (Non-Medical): Not on file  Physical Activity:   . Days of Exercise per Week: Not on file  . Minutes of Exercise per Session: Not on file  Stress:   . Feeling of Stress : Not on file  Social Connections:   . Frequency of Communication with Friends and Family: Not on file  . Frequency of Social Gatherings with Friends and Family: Not on file  . Attends Religious Services: Not on file  . Active Member of Clubs or Organizations: Not on file  . Attends Archivist Meetings: Not on file  . Marital Status: Not  on file   Past Surgical History:  Procedure Laterality Date  . BUBBLE STUDY  03/11/2020   Procedure: BUBBLE STUDY;  Surgeon: [name], MD;  Location: [place];  Service: Cardiovascular;;  . CARDIOVERSION N/A 05/13/2018   Procedure: CARDIOVERSION;  Surgeon: [name], MD;  Location: [facility] ENDOSCOPY;  Service: Cardiovascular;  Laterality: N/A;  . CARDIOVERSION N/A 06/13/2018   Procedure: CARDIOVERSION;  Surgeon: [name], MD;  Location: [facility] ENDOSCOPY;  Service: Cardiovascular;  Laterality: N/A;  . CARDIOVERSION N/A 06/23/2018   Procedure: CARDIOVERSION;  Surgeon: [name], MD;  Location: [facility] ENDOSCOPY;  Service: Cardiovascular;  Laterality: N/A;  . CARDIOVERSION N/A 03/11/2020   Procedure:  CARDIOVERSION;  Surgeon: [name], MD;  Location: [place];  Service: Cardiovascular;  Laterality: N/A;  . excision of actinic keratosis    . EYE SURGERY     eye lift  . IR CT HEAD LTD  01/27/2020  . IR PERCUTANEOUS ART THROMBECTOMY/INFUSION INTRACRANIAL INC DIAG ANGIO  01/27/2020      . IR PERCUTANEOUS ART THROMBECTOMY/INFUSION INTRACRANIAL INC DIAG ANGIO  01/27/2020  . RADIOLOGY WITH ANESTHESIA N/A 01/27/2020   Procedure: IR WITH ANESTHESIA;  Surgeon: Radiologist, Medication, MD;  Location: [place];  Service: Radiology;  Laterality: N/A;  . TEE WITHOUT CARDIOVERSION N/A 03/11/2020   Procedure: TRANSESOPHAGEAL ECHOCARDIOGRAM (TEE);  Surgeon: [name], MD;  Location: [place];  Service: Cardiovascular;  Laterality: N/A;  . TONSILLECTOMY    . WISDOM TOOTH EXTRACTION     Past Medical History:  Diagnosis Date  . Hypertension   . NICM (nonischemic cardiomyopathy) ([place]) 06/15/2018   04/23/18: EF 45%, 09/15/18: NOrmal LVEF  . Paroxysmal atrial fibrillation ([place]) 06/15/2018  . Stroke ([place])   . Visit for monitoring Tikosyn therapy 06/12/2018   BP 119/73   Pulse 62   Temp 98.7 F (37.1 C)   Ht 5\' 8"  (1.727 m)   SpO2 96%   BMI 27.37 kg/m   Opioid Risk Score:   Fall Risk Score:  `1  Depression screen PHQ 2/9  Depression screen [facility] 2/9 06/09/2020 03/17/2020  Decreased Interest 0 0  Down, Depressed, Hopeless 0 0  PHQ - 2 Score 0 0  Altered sleeping - 1  Tired, decreased energy - 0  Change in appetite - 0  Feeling bad or failure about yourself  - 0  Trouble concentrating - 1  Moving slowly or fidgety/restless - 3  Suicidal thoughts - 0  PHQ-9 Score - 5  Difficult doing work/chores - Not difficult at all    Review of Systems  Musculoskeletal: Positive for gait problem. Negative for arthralgias, back pain, joint swelling and myalgias.  Neurological: Positive for tremors, speech difficulty, weakness and numbness.  All other systems reviewed and are negative.     Objective:    Physical Exam  Constitutional: No distress . Vital signs reviewed. HENT: Normocephalic.  Atraumatic. Eyes: EOMI. No discharge. Cardiovascular: No JVD.   Respiratory: Normal effort.  No stridor.   GI: Non-distended.   Skin: Warm and dry.  Intact. Psych: Normal mood.  Normal behavior. Musc: No edema in extremities.  No tenderness in extremities. Neuro: Alert Expressive aphasia, some improvement Right facial weakness, improving Motor: RUE: Shoulder abduction 4-/5, elbow flex/ext 3/5, wrist extension 1/5, hand grip 3/5 RLE: Hip flexion, knee extension 4-/5, distally 0/5 [name]:  Right wrist flexors 2/4, finger flexors 1+/4  Right ankle inverters 1/4    Assessment & Plan:  Male with history of PAF/on Xarelto, and ICM presents for hospital  follow up for left MCA infarct.    1. Right hemiparesis, now with spasticity, impaired mobility and ADLs secondary to left MCA ischemic stroke  Continue therapies   Continue follow up with Neurology  Continue WHO/PRAFO nightly  Education for Botulinum toxin provided, plan to injection Xeomin:   Right FCR: 12.5 units    FCU: 12.5 units    Adductor Pollicis: 10 units    Flexor Hallucis Longus 32.5    Flexor Digitorum Longus 32.5  2.  Post stroke dysphagia:   Cont accommodations   Continue therapies  3.  Urinary retention/Neurogenic bladder:   D/ced Urecholine   Trial d/c Flomax  4.  Spasticity  Tizanidine caused lethargy  Will increase Baclofen to 10 TID  5. Gait abnormality  Continue therapies  Continue wheelchair for safety  >30 minutes spent in total with patient and brother in law in counseling and reviewing chart and communication with therapists

## 2020-06-09 NOTE — Therapy (Signed)
[facility] [address] [place], [place], [address] Phone: [phone]   Fax:  [phone]  Occupational Therapy Treatment  Patient Details  Name: Dustin Schneider MRN: [id number] Date of Birth: 1955/11/25 No data recorded  Encounter Date: 06/09/2020   OT End of Session - 06/09/20 1041    Visit Number 23    Number of Visits 25    Date for OT Re-Evaluation 06/23/20    Authorization Type BC/BS - No auth required, covered 100%    OT Start Time [phone]    OT Stop Time 1014    OT Time Calculation (min) 38 min    Activity Tolerance Patient tolerated treatment well    Behavior During Therapy [facility] for tasks assessed/performed           Past Medical History:  Diagnosis Date  . Hypertension   . NICM (nonischemic cardiomyopathy) ([place]) 06/15/2018   04/23/18: EF 45%, 09/15/18: NOrmal LVEF  . Paroxysmal atrial fibrillation ([place]) 06/15/2018  . Stroke ([place])   . Visit for monitoring Tikosyn therapy 06/12/2018    Past Surgical History:  Procedure Laterality Date  . BUBBLE STUDY  03/11/2020   Procedure: BUBBLE STUDY;  Surgeon: [name], MD;  Location: [place];  Service: Cardiovascular;;  . CARDIOVERSION N/A 05/13/2018   Procedure: CARDIOVERSION;  Surgeon: [name], MD;  Location: [facility] ENDOSCOPY;  Service: Cardiovascular;  Laterality: N/A;  . CARDIOVERSION N/A 06/13/2018   Procedure: CARDIOVERSION;  Surgeon: [name], MD;  Location: [facility] ENDOSCOPY;  Service: Cardiovascular;  Laterality: N/A;  . CARDIOVERSION N/A 06/23/2018   Procedure: CARDIOVERSION;  Surgeon: [name], MD;  Location: [facility] ENDOSCOPY;  Service: Cardiovascular;  Laterality: N/A;  . CARDIOVERSION N/A 03/11/2020   Procedure: CARDIOVERSION;  Surgeon: [name], MD;  Location: [place];  Service: Cardiovascular;  Laterality: N/A;  . excision of actinic keratosis    . EYE SURGERY     eye lift  . IR CT HEAD LTD  01/27/2020  . IR PERCUTANEOUS ART  THROMBECTOMY/INFUSION INTRACRANIAL INC DIAG ANGIO  01/27/2020      . IR PERCUTANEOUS ART THROMBECTOMY/INFUSION INTRACRANIAL INC DIAG ANGIO  01/27/2020  . RADIOLOGY WITH ANESTHESIA N/A 01/27/2020   Procedure: IR WITH ANESTHESIA;  Surgeon: Radiologist, Medication, MD;  Location: [place];  Service: Radiology;  Laterality: N/A;  . TEE WITHOUT CARDIOVERSION N/A 03/11/2020   Procedure: TRANSESOPHAGEAL ECHOCARDIOGRAM (TEE);  Surgeon: [name], MD;  Location: [place];  Service: Cardiovascular;  Laterality: N/A;  . TONSILLECTOMY    . WISDOM TOOTH EXTRACTION      There were no vitals filed for this visit.         Treatment: Supine closed chain shoulder flexion, chest press then triceps extension, with min-mod facilitation for proper positioning and to avoid compensation Low and mid range closed chain shoulder flexion with min facilitation/ v.c to avoid compensation with RUE. Gentle joint mobs and P/ROM to right wrist  Arm bike x 5 mins level 1 with RUE wrapped for reciprocal movement. Flipping large playing cards with RUE, min facilitation/ v.c                 OT Short Term Goals - 05/05/20 1125      OT SHORT TERM GOAL #1   Title Independent with initial HEP    Time 4    Period Weeks    Status Achieved      OT SHORT TERM GOAL #2   Title Pt to perform low to mid  level reaching of light objects RUE in prep for feeding self and bathing    Time 4    Period Weeks    Status Achieved   04/20/20     OT SHORT TERM GOAL #3   Title Pt to feed self 50% of the time with Rt dominant hand and A/E prn    Time 4    Period Weeks    Status On-going   04/20/20: able to eat finger foods     OT SHORT TERM GOAL #4   Title Pt to perform UE dressing of pullover shirt mod I level    Time 4    Period Weeks    Status Achieved   met per pt and brother     OT SHORT TERM GOAL #5   Title Pt to perform LE dressing and all bathing with no more than mod assist using A/E and strategies prn    Time  4    Period Weeks    Status Achieved   04/20/20:  set-up for bedside bath, mod A for LE dressing     OT SHORT TERM GOAL #6   Title Pt to perform toileting and toilet transfers with no more than min assist overall    Time 4    Period Weeks    Status On-going      OT SHORT TERM GOAL #7   Title Improve functional use of RUE as evidenced by performing 8 blocks on Box & Blocks test    Baseline 0    Time 4    Period Weeks    Status Achieved   04/20/20:  6 blocks, 05/05/20: 12 blocks            OT Long Term Goals - 05/05/20 1126      OT LONG TERM GOAL #1   Title Pt to be independent with updated HEP    Time 12    Period Weeks    Status New      OT LONG TERM GOAL #2   Title Pt to perform all BADLS at mod I level    Time 12    Period Weeks    Status On-going      OT LONG TERM GOAL #3   Title Pt to feed self 75% or greater with Rt dominant hand and grooming 50% or more with Rt hand    Time 12    Period Weeks    Status New      OT LONG TERM GOAL #4   Title Pt to retrieve light weight objects from high shelf RUE consistently    Time 12    Period Weeks    Status New      OT LONG TERM GOAL #5   Title Pt to demo 25 lbs grip strength or more Rt dominant hand for gripping, opening jars/containers    Time 12    Period Weeks    Status On-going   12.5 lbs     OT LONG TERM GOAL #6   Title Pt to perform simple meal prep and light cleaning tasks mod I level    Time 12    Period Weeks    Status New      OT LONG TERM GOAL #7   Title Improve functional use RUE as evidenced by performing 25 blocks on Box & Blocks and performing 9 hole peg test in 90 sec or under    Time 12    Period Weeks  Status On-going   12 blocks     OT LONG TERM GOAL #8   Title Pt to returned to modified art/sculpting tasks with task modifications and A/E prn    Time 12    Period Weeks    Status New                  Patient will benefit from skilled therapeutic intervention in order to  improve the following deficits and impairments:           Visit Diagnosis: Muscle weakness (generalized)  Hemiplegia and hemiparesis following cerebral infarction affecting right dominant side (HCC)  Other lack of coordination  Other symptoms and signs involving the nervous system    Problem List Patient Active Problem List   Diagnosis Date Noted  . Abnormality of gait 04/28/2020  . Neurogenic bladder 03/17/2020  . Sepsis due to pneumonia ([place]) 03/06/2020  . History of stroke with residual deficit 03/06/2020  . Transient hypotension 03/06/2020  . Spastic hemiplegia affecting nondominant side ([place])   . History of hypertension   . Expressive aphasia   . AKI (acute kidney injury) ([place])   . Global aphasia   . Hypoalbuminemia due to protein-calorie malnutrition ([place])   . Dysphagia, post-stroke   . Hyperlipidemia 02/02/2020  . Dysphagia following cerebral infarction 02/02/2020  . Aspiration pneumonia ([place]) 02/02/2020  . Acute ischemic left MCA stroke ([place]) 02/02/2020  . Acute respiratory failure ([place])   . Acute ischemic stroke [facility]) s/p clot retrieval L MCA & ACA A3 01/27/2020  . Aspiration into airway 01/27/2020  . Chronic anticoagulation 01/27/2020  . Paroxysmal atrial fibrillation ([place]) 06/15/2018  . Essential hypertension 06/15/2018  . NICM (nonischemic cardiomyopathy) ([place]) 06/15/2018  . Visit for monitoring Tikosyn therapy 06/12/2018    Mikaya Bunner 06/09/2020, 1:15 PM  [facility] [address] [place], [place], [address] Phone: [phone]   Fax:  [phone]  Name: Dustin Schneider MRN: [id number] Date of Birth: [birth year]

## 2020-06-09 NOTE — Therapy (Signed)
[facility] [address] [place], [place], [address] Phone: [phone]   Fax:  [phone]  Physical Therapy Treatment  Patient Details  Name: Dustin Schneider MRN: [id number] Date of Birth: 1956/03/12 Referring Provider (PT): Lauraine Rinne, PA-C (followed by Dr. Delice Lesch)   Encounter Date: 06/09/2020   PT End of Session - 06/09/20 1220    Visit Number 26    Number of Visits 48    Date for PT Re-Evaluation 09/01/20    Authorization Type BCBS    PT Start Time 8182    PT Stop Time 0930    PT Time Calculation (min) 43 min    Equipment Utilized During Treatment Gait belt    Activity Tolerance Patient tolerated treatment well    Behavior During Therapy [facility] for tasks assessed/performed           Past Medical History:  Diagnosis Date   Hypertension    NICM (nonischemic cardiomyopathy) ([place]) 06/15/2018   04/23/18: EF 45%, 09/15/18: NOrmal LVEF   Paroxysmal atrial fibrillation ([place]) 06/15/2018   Stroke ([place])    Visit for monitoring Tikosyn therapy 06/12/2018    Past Surgical History:  Procedure Laterality Date   BUBBLE STUDY  03/11/2020   Procedure: BUBBLE STUDY;  Surgeon: [name], MD;  Location: [place];  Service: Cardiovascular;;   CARDIOVERSION N/A 05/13/2018   Procedure: CARDIOVERSION;  Surgeon: [name], MD;  Location: [place];  Service: Cardiovascular;  Laterality: N/A;   CARDIOVERSION N/A 06/13/2018   Procedure: CARDIOVERSION;  Surgeon: [name], MD;  Location: [facility] ENDOSCOPY;  Service: Cardiovascular;  Laterality: N/A;   CARDIOVERSION N/A 06/23/2018   Procedure: CARDIOVERSION;  Surgeon: [name], MD;  Location: [place];  Service: Cardiovascular;  Laterality: N/A;   CARDIOVERSION N/A 03/11/2020   Procedure: CARDIOVERSION;  Surgeon: [name], MD;  Location: [place];  Service: Cardiovascular;  Laterality: N/A;   excision of actinic keratosis     EYE SURGERY      eye lift   IR CT HEAD LTD  01/27/2020   IR PERCUTANEOUS ART THROMBECTOMY/INFUSION INTRACRANIAL INC DIAG ANGIO  01/27/2020       IR PERCUTANEOUS ART THROMBECTOMY/INFUSION INTRACRANIAL INC DIAG ANGIO  01/27/2020   RADIOLOGY WITH ANESTHESIA N/A 01/27/2020   Procedure: IR WITH ANESTHESIA;  Surgeon: Radiologist, Medication, MD;  Location: [place];  Service: Radiology;  Laterality: N/A;   TEE WITHOUT CARDIOVERSION N/A 03/11/2020   Procedure: TRANSESOPHAGEAL ECHOCARDIOGRAM (TEE);  Surgeon: [name], MD;  Location: [place];  Service: Cardiovascular;  Laterality: N/A;   TONSILLECTOMY     WISDOM TOOTH EXTRACTION      There were no vitals filed for this visit.   Subjective Assessment - 06/09/20 0850    Subjective Should be getting his brace october 8.    Patient is accompained by: Family member   sister   Pertinent History PMH: HTN, paroxysmal a fib    Limitations Standing;Walking    Patient Stated Goals "wants everything working on R side again"    Currently in Pain? No/denies                             [facility] Adult PT Treatment/Exercise - 06/09/20 0917      Transfers   Transfers Sit to Stand;Stand to Sit;Stand Pivot Transfers    Sit to Stand 5: Supervision;4: Min guard    Sit to Stand Details (indicate cue type and reason) cues to  make sure RLE is positioned properly prior to standing    Stand to Sit 5: Supervision    Stand to Sit Details cues to attend to RLE to remove R hand from orthosis before sitting down    Squat Pivot Transfers 5: Supervision    Squat Pivot Transfer Details (indicate cue type and reason) from w/c > mat table towards pt's L side, pt impulsively transfers himself to the mat at start of session      Ambulation/Gait   Ambulation/Gait Yes    Ambulation/Gait Assistance 4: Min assist    Ambulation/Gait Assistance Details pt demonstrating decr R foot clearance at times (scuffing toe on ground) and incr supinator/inversion tone affecting pt's  ability to heel strike and clear R leg. Had another therapist watch pt's gait pattern who suggested possible leg length discrepancy that could be affecting pt's ability to clear R leg, placed insole of pt's R shoe into L shoe to see if it would help, pt demonstrating improvement in R foot clearance for a couple of steps, but then reverted back to old pattern when more fatigued. Trialed again a blue toe cap for one lap to see if it would assist, however no noticeable difference. PT to send message to Dr. Posey Pronto about potential botox to decr incr supination to RLE during gait. Pt providing min A and assist at times for RLE placement, pt demonstrating decr R external rotation during gait today. Pt with improved mechanics when cued to slow down speed as pt with tendency at times to ambulate too quickly     Ambulation Distance (Feet) 115 Feet   x4   Assistive device Rolling walker    Gait Pattern Step-through pattern;Decreased stance time - right;Decreased hip/knee flexion - right;Narrow base of support    Ambulation Surface Level;Indoor    Gait Comments staggered stance sit to stands with RLE posteriorly - chair anteriorly for balance as needed, standing with no UE support and verbal cues for pt to shift weight to R prior to sitting x10 reps, needing LUE support to stand       Neuro Re-ed    Neuro Re-ed Details  standing balance at edge of mat no UE support - chair in front for balance, with feet hip width distance performed eyes closed 2 x15 seconds and then head turns 2 x5 reps and head nods 2 x 5 reps with min guard/min A for balance, then with single UE support tapping LLE to 4" step for incr weight bearing/modified SLS to RLE with placing foot on step for approx.. 10 seconds - cues for tall posture and R quad activation                     PT Short Term Goals - 06/03/20 1050      PT SHORT TERM GOAL #1   Title Pt will be mod I with all transfers with use of right AFO once he receives it for  improved mobility.    Baseline 05/17/20 supervision/CGA    Time 4    Period Weeks    Status Revised    Target Date 07/03/20      PT SHORT TERM GOAL #2   Title Pt will be able to ambulate > 345' with RW with right AFO and hand grip attachment min assist for improved mobility.    Baseline 230' min assist with RW and right AFO on 06/03/20    Time 4    Period Weeks    Status  New    Target Date 07/03/20      PT SHORT TERM GOAL #3   Title Pt will decrease 5 x sit to stand from 17.98 sec to <15 sec for improved balance and functional strength.    Baseline 06/03/20 17.98 sec from mat with hand    Time 4    Period Weeks    Status New    Target Date 07/03/20             PT Long Term Goals - 06/03/20 1057      PT LONG TERM GOAL #1   Title Pt and pt's family members will be independent with final HEP in order to build upon functional gains made in therapy. ALL LTGS DUE 09/01/20    Baseline Pt has been performing current HEP with family but PT continues to add to it 06/03/20    Time 12    Period Weeks    Status On-going    Target Date 09/01/20      PT LONG TERM GOAL #2   Title Pt will ambulate >500' on varied surfaces supervision with RW and R AFO for improved household mobility and short community distances.    Baseline 230' with RW with right hand grip attachment and R AFO min assist on 06/03/20    Time 12    Period Weeks    Status Revised    Target Date 09/01/20      PT LONG TERM GOAL #3   Title Pt will decrease TUG from 27 sec to <20 sec for improved balance and decreased fall risk.    Baseline 06/03/20 TUG=27 sec with RW    Time 12    Period Weeks    Status New    Target Date 09/01/20      PT LONG TERM GOAL #4   Title Pt will be able to ambulate up/down curb with RW and R AFO CGA for improved community access.    Time 12    Period Weeks    Status New    Target Date 09/01/20                 Plan - 06/09/20 1221    Clinical Impression Statement Continued  gait training today with RW, R hand orthosis, and R AFO with T strap. Pt continues to demonstrate decr R foot clearance during gait, pt with incr tone of supinators that decr pt's ability to perform R heel strike. PT to send message to pt's physician about possible Botox to decr supinatior/inversion tone during gait to see if this will improve pt's gait mechanics. Will continue to progress towards LTGs.    Personal Factors and Comorbidities Comorbidity 3+    Comorbidities dense L MCA CVA, paroxysmal A. fib on chronic Xarelto, nonischemic cardiomyopathy and hypertension.    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, works as an Electrical engineer Evolving/Moderate complexity    Rehab Potential Good    PT Frequency 2x / week    PT Duration 12 weeks    PT Treatment/Interventions ADLs/Self Care Home Management;Aquatic Therapy;DME Instruction;Gait training;Stair training;Functional mobility training;Therapeutic activities;Therapeutic exercise;Electrical Stimulation;Balance training;Neuromuscular re-education;Wheelchair mobility training;Orthotic Fit/Training;Patient/family education;Passive range of motion;Energy conservation    PT Next Visit Plan gait training with focus on R heel strike. Continue with RLE strengthening, weight shifting/NMR to RLE. continue gait training with RW, AFO with t-strap and hand orthotic.  Consulted and Agree with Plan of Care Patient;Family member/caregiver    Family Member Consulted brother-in-law, [name]           Patient will benefit from skilled therapeutic intervention in order to improve the following deficits and impairments:  Abnormal gait, Decreased activity tolerance, Decreased balance, Decreased cognition, Decreased mobility, Decreased coordination,  Decreased range of motion, Decreased endurance, Decreased strength, Hypomobility, Difficulty walking, Impaired UE functional use, Impaired vision/preception, Postural dysfunction, Impaired sensation  Visit Diagnosis: Muscle weakness (generalized)  Hemiplegia and hemiparesis following cerebral infarction affecting right dominant side (HCC)  Other lack of coordination  Other symptoms and signs involving the nervous system  Unsteadiness on feet     Problem List Patient Active Problem List   Diagnosis Date Noted   Abnormality of gait 04/28/2020   Neurogenic bladder 03/17/2020   Sepsis due to pneumonia ([place]) 03/06/2020   History of stroke with residual deficit 03/06/2020   Transient hypotension 03/06/2020   Spastic hemiplegia affecting nondominant side ([place])    History of hypertension    Expressive aphasia    AKI (acute kidney injury) ([place])    Global aphasia    Hypoalbuminemia due to protein-calorie malnutrition ([place])    Dysphagia, post-stroke    Hyperlipidemia 02/02/2020   Dysphagia following cerebral infarction 02/02/2020   Aspiration pneumonia ([place]) 02/02/2020   Acute ischemic left MCA stroke ([place]) 02/02/2020   Acute respiratory failure (HCC)    Acute ischemic stroke ([place]) s/p clot retrieval L MCA & ACA A3 01/27/2020   Aspiration into airway 01/27/2020   Chronic anticoagulation 01/27/2020   Paroxysmal atrial fibrillation ([place]) 06/15/2018   Essential hypertension 06/15/2018   NICM (nonischemic cardiomyopathy) ([place]) 06/15/2018   Visit for monitoring Tikosyn therapy 06/12/2018    Arliss Journey, PT, DPT  06/09/2020, 12:29 PM  [facility] [address] [place], [place], [address] Phone: [phone]   Fax:  [phone]  Name: NEWMAN WAREN MRN: [id number] Date of Birth: [birth year]

## 2020-06-09 NOTE — Telephone Encounter (Signed)
Dr. Posey Pronto,  [name] has been working with PT at [facility]. I see that he sees you today for follow up at 2 PM. He has increased supination during gait which is affecting his mechanics. I was wondering if it would be possible to get Botox to his ankle invertors to assist with improved heel strike during gait.  Please let me know if you have any questions.   Thanks so much, [name], PT, DPT 06/09/20 10:14 AM

## 2020-06-09 NOTE — Telephone Encounter (Signed)
Yes, I plan to perform Botulinum toxin on next visit. Thanks for the follow up.

## 2020-06-10 ENCOUNTER — Other Ambulatory Visit: Payer: Self-pay

## 2020-06-10 NOTE — Telephone Encounter (Signed)
Dustin Schneider has been on 10 mg baclofen tid since 05/13/2020 ( phone message). You saw him yesterday and was going to increase but what your note says is the same dose 10 mg tid.  Do you want to increase more?

## 2020-06-10 NOTE — Telephone Encounter (Signed)
The prescription was changed and ordered to [facility] (pharmacy on file).  There is given a note that since dose increase.  Thanks.

## 2020-06-10 NOTE — Telephone Encounter (Signed)
Dustin Schneider called for Dustin Schneider - he states Dr Posey Pronto was going to increase the baclofen - he tried yesterday for someone to call him to let him know would pharmacy be notified or what he was to do -- he reached out to pharmach again this AM - they see no change - please advise his number is [phone]  "Dustin Schneider' is Dustin Schneider caregiver First call 10.28.21 at 3:48pm second call 10.29.21 at 9:45am

## 2020-06-10 NOTE — Telephone Encounter (Signed)
He was on Baclofen 5 TID (he was cutting them in half).  Yesterday, he was instructed to increased to 10mg  TID (full tab).  Thanks.

## 2020-06-13 ENCOUNTER — Other Ambulatory Visit: Payer: Self-pay

## 2020-06-13 ENCOUNTER — Ambulatory Visit: Payer: BC Managed Care – PPO | Admitting: Speech Pathology

## 2020-06-13 ENCOUNTER — Ambulatory Visit: Payer: BC Managed Care – PPO | Admitting: Occupational Therapy

## 2020-06-13 ENCOUNTER — Ambulatory Visit: Payer: BC Managed Care – PPO | Attending: Physician Assistant | Admitting: Physical Therapy

## 2020-06-13 DIAGNOSIS — R2689 Other abnormalities of gait and mobility: Secondary | ICD-10-CM | POA: Diagnosis present

## 2020-06-13 DIAGNOSIS — R2681 Unsteadiness on feet: Secondary | ICD-10-CM | POA: Diagnosis present

## 2020-06-13 DIAGNOSIS — R278 Other lack of coordination: Secondary | ICD-10-CM | POA: Diagnosis present

## 2020-06-13 DIAGNOSIS — I69351 Hemiplegia and hemiparesis following cerebral infarction affecting right dominant side: Secondary | ICD-10-CM

## 2020-06-13 DIAGNOSIS — M6281 Muscle weakness (generalized): Secondary | ICD-10-CM | POA: Diagnosis present

## 2020-06-13 DIAGNOSIS — I69318 Other symptoms and signs involving cognitive functions following cerebral infarction: Secondary | ICD-10-CM

## 2020-06-13 DIAGNOSIS — R29818 Other symptoms and signs involving the nervous system: Secondary | ICD-10-CM

## 2020-06-13 NOTE — Therapy (Signed)
[facility] [address] [place], [place], [address] Phone: [phone]   Fax:  [phone]  Occupational Therapy Treatment  Patient Details  Name: Dustin Schneider MRN: [id number] Date of Birth: 1955/09/02 No data recorded  Encounter Date: 06/13/2020   OT End of Session - 06/13/20 1243    Visit Number 24    Number of Visits 41    Date for OT Re-Evaluation 08/19/20    Authorization Type BC/BS - No auth required, covered 100%    OT Start Time 1020    OT Stop Time 1100    OT Time Calculation (min) 40 min    Activity Tolerance Patient tolerated treatment well    Behavior During Therapy [facility] for tasks assessed/performed           Past Medical History:  Diagnosis Date  . Hypertension   . NICM (nonischemic cardiomyopathy) ([place]) 06/15/2018   04/23/18: EF 45%, 09/15/18: NOrmal LVEF  . Paroxysmal atrial fibrillation ([place]) 06/15/2018  . Stroke ([place])   . Visit for monitoring Tikosyn therapy 06/12/2018    Past Surgical History:  Procedure Laterality Date  . BUBBLE STUDY  03/11/2020   Procedure: BUBBLE STUDY;  Surgeon: [name], MD;  Location: [place];  Service: Cardiovascular;;  . CARDIOVERSION N/A 05/13/2018   Procedure: CARDIOVERSION;  Surgeon: [name], MD;  Location: [facility]COPY;  Service: Cardiovascular;  Laterality: N/A;  . CARDIOVERSION N/A 06/13/2018   Procedure: CARDIOVERSION;  Surgeon: [name], MD;  Location: [facility] ENDOSCOPY;  Service: Cardiovascular;  Laterality: N/A;  . CARDIOVERSION N/A 06/23/2018   Procedure: CARDIOVERSION;  Surgeon: [name], MD;  Location: [facility] ENDOSCOPY;  Service: Cardiovascular;  Laterality: N/A;  . CARDIOVERSION N/A 03/11/2020   Procedure: CARDIOVERSION;  Surgeon: [name], MD;  Location: [place];  Service: Cardiovascular;  Laterality: N/A;  . excision of actinic keratosis    . EYE SURGERY     eye lift  . IR CT HEAD LTD  01/27/2020  . IR PERCUTANEOUS ART  THROMBECTOMY/INFUSION INTRACRANIAL INC DIAG ANGIO  01/27/2020      . IR PERCUTANEOUS ART THROMBECTOMY/INFUSION INTRACRANIAL INC DIAG ANGIO  01/27/2020  . RADIOLOGY WITH ANESTHESIA N/A 01/27/2020   Procedure: IR WITH ANESTHESIA;  Surgeon: Radiologist, Medication, MD;  Location: [place];  Service: Radiology;  Laterality: N/A;  . TEE WITHOUT CARDIOVERSION N/A 03/11/2020   Procedure: TRANSESOPHAGEAL ECHOCARDIOGRAM (TEE);  Surgeon: [name], MD;  Location: [place];  Service: Cardiovascular;  Laterality: N/A;  . TONSILLECTOMY    . WISDOM TOOTH EXTRACTION      There were no vitals filed for this visit.   Subjective Assessment - 06/13/20 1242    Subjective  Pt reports eating his yogurt with Rt hand    Patient is accompanied by: Family member    Pertinent History Lt MCA CVA 01/27/20, readmitted to hospital with HAP and fever on 03/06/20 and released 03/11/20. PMH: paroxsymal A-fib, HTN    Limitations fall    Currently in Pain? No/denies           Assessed goals and progress to date for renewal (completed today) - see goal section.  Pt practiced donning/doffing long sleeve shirt w/ short sleeve shirt underneath w/ set up only. Pt able to doff shoes and socks I'ly and don Lt shoe and sock and Rt sock. Pt with min assist to don Rt shoe. Pt/family shown shoe buttons and how to use - therapist demo and pt return demo. Provided handout and instructed in  how/where to purchase. Pt also instructed to don pants to thighs and don Lt shoe and sock and Rt sock, then just ask for help to don Rt shoe and to stand to pull up pants over hips.  Passive wrist extension applied, then practiced flipping cards over for finger extension and supination. Pt also practiced picking up cones and stacking Rt hand                       OT Short Term Goals - 06/13/20 1245      OT SHORT TERM GOAL #1   Title Independent with initial HEP    Time 4    Period Weeks    Status Achieved      OT SHORT TERM GOAL  #2   Title Pt to perform low to mid level reaching of light objects RUE in prep for feeding self and bathing    Time 4    Period Weeks    Status Partially Met   low level, inconsistent mid level     OT SHORT TERM GOAL #3   Title Pt to feed self 50% of the time with Rt dominant hand and A/E prn    Time 4    Period Weeks    Status On-going   06/13/20: Pt reports eating yogurt/breakfast and some finger foods, but using Lt hand for lunch/dinner)     OT SHORT TERM GOAL #4   Title Pt to perform UE dressing of pullover shirt mod I level    Time 4    Period Weeks    Status Achieved   met per pt and brother     OT SHORT TERM GOAL #5   Title Pt to perform LE dressing and all bathing with no more than mod assist using A/E and strategies prn    Time 4    Period Weeks    Status Achieved   04/20/20:  set-up for bedside bath, mod A for LE dressing     OT SHORT TERM GOAL #6   Title Pt to perform toileting and toilet transfers with no more than min assist overall    Time 4    Period Weeks    Status Achieved   supervision     OT SHORT TERM GOAL #7   Title Improve functional use of RUE as evidenced by performing 8 blocks on Box & Blocks test    Baseline 0    Time 4    Period Weeks    Status Achieved   04/20/20:  6 blocks, 05/05/20: 12 blocks            OT Long Term Goals - 06/13/20 1248      OT LONG TERM GOAL #1   Title Pt to be independent with updated HEP    Time 12    Period Weeks    Status On-going      OT LONG TERM GOAL #2   Title Pt to perform all BADLS at mod I level    Time 12    Period Weeks    Status On-going   min assist for LE dressing     OT LONG TERM GOAL #3   Title Pt to feed self 50% or greater with Rt dominant hand and grooming 25% or more with Rt hand    Time 12    Period Weeks    Status Revised      OT LONG TERM GOAL #4   Title  Pt to retrieve light weight objects from high shelf RUE consistently    Time 12    Period Weeks    Status On-going      OT LONG  TERM GOAL #5   Title Pt to demo 25 lbs grip strength or more Rt dominant hand for gripping, opening jars/containers    Time 12    Period Weeks    Status On-going   12.5 lbs     OT LONG TERM GOAL #6   Title Pt to perform simple meal prep and light cleaning tasks mod I level    Time 12    Period Weeks    Status Deferred   deferred d/t inability to stand/ambulate without assist and AFO     OT LONG TERM GOAL #7   Title Improve functional use RUE as evidenced by performing 25 blocks on Box & Blocks    Time 12    Period Weeks    Status Revised   12 blocks     OT LONG TERM GOAL #8   Title Pt to returned to modified art/sculpting tasks with task modifications and A/E prn    Time 12    Period Weeks    Status Deferred                 Plan - 06/13/20 1251    Clinical Impression Statement Re-assessed progress towards goals for renewal period. Renewal completed today for an additional 8 weeks beginning next week (this week still in original POC).    Occupational performance deficits (Please refer to evaluation for details): ADL's;IADL's;Work;Leisure;Social Participation    Body Structure / Function / Physical Skills ADL;ROM;Dexterity;Balance;IADL;Body mechanics;Improper spinal/pelvic alignment;Sensation;Mobility;Flexibility;Strength;Coordination;FMC;Tone;UE functional use;Decreased knowledge of use of DME;Proprioception    Rehab Potential Good    Comorbidities Affecting Occupational Performance: May have comorbidities impacting occupational performance    OT Frequency 2x / week    OT Duration 8 weeks   additional 8 weeks beginning week of 06/20/20   OT Treatment/Interventions Self-care/ADL training;Therapeutic exercise;Functional Mobility Training;Neuromuscular education;Manual Therapy;Splinting;Aquatic Therapy;Manual lymph drainage;Therapeutic activities;Coping strategies training;DME and/or AE instruction;Electrical Stimulation;Fluidtherapy;Passive range of motion;Patient/family  education    Plan continue NMR    Consulted and Agree with Plan of Care Patient    Family Member Consulted brother           Patient will benefit from skilled therapeutic intervention in order to improve the following deficits and impairments:   Body Structure / Function / Physical Skills: ADL, ROM, Dexterity, Balance, IADL, Body mechanics, Improper spinal/pelvic alignment, Sensation, Mobility, Flexibility, Strength, Coordination, FMC, Tone, UE functional use, Decreased knowledge of use of DME, Proprioception       Visit Diagnosis: Hemiplegia and hemiparesis following cerebral infarction affecting right dominant side (HCC)  Other lack of coordination  Other symptoms and signs involving the nervous system  Unsteadiness on feet    Problem List Patient Active Problem List   Diagnosis Date Noted  . Benign essential HTN 06/09/2020  . Abnormality of gait 04/28/2020  . Neurogenic bladder 03/17/2020  . Sepsis due to pneumonia ([place]) 03/06/2020  . History of stroke with residual deficit 03/06/2020  . Transient hypotension 03/06/2020  . Spastic hemiplegia affecting nondominant side ([place])   . History of hypertension   . Expressive aphasia   . AKI (acute kidney injury) ([place])   . Global aphasia   . Hypoalbuminemia due to protein-calorie malnutrition ([place])   . Dysphagia, post-stroke   . Hyperlipidemia 02/02/2020  . Dysphagia following cerebral infarction 02/02/2020  .  Aspiration pneumonia ([place]) 02/02/2020  . Acute ischemic left MCA stroke ([place]) 02/02/2020  . Acute respiratory failure ([place])   . Acute ischemic stroke [facility]) s/p clot retrieval L MCA & ACA A3 01/27/2020  . Aspiration into airway 01/27/2020  . Chronic anticoagulation 01/27/2020  . Paroxysmal atrial fibrillation ([place]) 06/15/2018  . Essential hypertension 06/15/2018  . NICM (nonischemic cardiomyopathy) ([place]) 06/15/2018  . Visit for monitoring Tikosyn therapy 06/12/2018    Carey Bullocks, OTR/L 06/13/2020,  12:55 PM  [facility] [address] [place], [place], [address] Phone: [phone]   Fax:  [phone]  Name: Dustin Schneider MRN: [id number] Date of Birth: 05/16/1956

## 2020-06-13 NOTE — Therapy (Addendum)
[facility] [address] [place], [place], [address] Phone: [phone]   Fax:  [phone]  Physical Therapy Treatment  Patient Details  Name: Dustin Schneider MRN: [id number] Date of Birth: 1955/10/02 Referring Provider (PT): Lauraine Rinne, PA-C (followed by Dr. Delice Lesch)   Encounter Date: 06/13/2020   PT End of Session - 06/13/20 1332    Visit Number 27    Number of Visits 5    Date for PT Re-Evaluation 09/01/20    Authorization Type BCBS    PT Start Time 0845    PT Stop Time 0928    PT Time Calculation (min) 43 min    Equipment Utilized During Treatment Gait belt    Activity Tolerance Patient tolerated treatment well    Behavior During Therapy [facility] for tasks assessed/performed           Past Medical History:  Diagnosis Date  . Hypertension   . NICM (nonischemic cardiomyopathy) ([place]) 06/15/2018   04/23/18: EF 45%, 09/15/18: NOrmal LVEF  . Paroxysmal atrial fibrillation ([place]) 06/15/2018  . Stroke ([place])   . Visit for monitoring Tikosyn therapy 06/12/2018    Past Surgical History:  Procedure Laterality Date  . BUBBLE STUDY  03/11/2020   Procedure: BUBBLE STUDY;  Surgeon: [name], MD;  Location: [place];  Service: Cardiovascular;;  . CARDIOVERSION N/A 05/13/2018   Procedure: CARDIOVERSION;  Surgeon: [name], MD;  Location: [facility] ENDOSCOPY;  Service: Cardiovascular;  Laterality: N/A;  . CARDIOVERSION N/A 06/13/2018   Procedure: CARDIOVERSION;  Surgeon: [name], MD;  Location: [facility] ENDOSCOPY;  Service: Cardiovascular;  Laterality: N/A;  . CARDIOVERSION N/A 06/23/2018   Procedure: CARDIOVERSION;  Surgeon: [name], MD;  Location: [facility] ENDOSCOPY;  Service: Cardiovascular;  Laterality: N/A;  . CARDIOVERSION N/A 03/11/2020   Procedure: CARDIOVERSION;  Surgeon: [name], MD;  Location: [place];  Service: Cardiovascular;  Laterality: N/A;  . excision of actinic keratosis    . EYE SURGERY     eye  lift  . IR CT HEAD LTD  01/27/2020  . IR PERCUTANEOUS ART THROMBECTOMY/INFUSION INTRACRANIAL INC DIAG ANGIO  01/27/2020      . IR PERCUTANEOUS ART THROMBECTOMY/INFUSION INTRACRANIAL INC DIAG ANGIO  01/27/2020  . RADIOLOGY WITH ANESTHESIA N/A 01/27/2020   Procedure: IR WITH ANESTHESIA;  Surgeon: Radiologist, Medication, MD;  Location: [place];  Service: Radiology;  Laterality: N/A;  . TEE WITHOUT CARDIOVERSION N/A 03/11/2020   Procedure: TRANSESOPHAGEAL ECHOCARDIOGRAM (TEE);  Surgeon: [name], MD;  Location: [place];  Service: Cardiovascular;  Laterality: N/A;  . TONSILLECTOMY    . WISDOM TOOTH EXTRACTION      There were no vitals filed for this visit.   Subjective Assessment - 06/13/20 0848    Subjective Notices standing has been easier. Dr. Posey Pronto incr his baclofen - will also be getting botox injections next week.    Patient is accompained by: Family member   sister   Pertinent History PMH: HTN, paroxysmal a fib    Limitations Standing;Walking    Patient Stated Goals "wants everything working on R side again"    Currently in Pain? No/denies                             [facility] Adult PT Treatment/Exercise - 06/13/20 0001      Transfers   Transfers Sit to Stand;Stand to Sit;Stand Pivot Transfers    Sit to Stand 5: Supervision;With upper extremity assist  Stand to Sit 5: Supervision;With upper extremity assist    Stand Pivot Transfers 4: Min assist    Stand Pivot Transfer Details (indicate cue type and reason) cues to attend to RLE and put weight on it during transfer (pt with R AFO donned)    Squat Pivot Transfers 5: Supervision    Squat Pivot Transfer Details (indicate cue type and reason) from w/c > mat table    Comments pt reports that he is transferring better at home - educated pt on needing to use air caste at this time for transfers for incr ankle stability until he receives his AFO for home, pt's gf reports compliance with air caste      Ambulation/Gait     Ambulation/Gait Yes    Ambulation/Gait Assistance 4: Min assist    Ambulation/Gait Assistance Details pt continues to have incr supination/inversion during gait, decr R foot clearance and pt's inability to perform with R heel strike, therapist providing min A at times for proper foot placement with RLE for improved gait mechanics     Ambulation Distance (Feet) 230 Feet   x1   Assistive device Rolling walker    Gait Pattern Step-through pattern;Decreased stance time - right;Decreased hip/knee flexion - right;Narrow base of support    Ambulation Surface Level;Indoor      Neuro Re-ed    Neuro Re-ed Details  With no UE support with LLE on 4" block performed mini squats with 3 second holds at end range (chair anteriorly in case for balance), x10 reps needing min A at times for balance/keeping weight towards RLE.  Stepping LLE over 2" beam and back to middle for incr weight bearing on RLE and closed chain hip ABD strengthening 2 x 10 reps with LUE support, cues for posture, min A for balance and making sure pt's pelvis stays in alignment.       Exercises   Exercises Other Exercises    Other Exercises  supine R ankle DF stretch 3 x 30 seconds, then performing PROM into ankle eversion 5 reps x 5 seconds, and additional 2 x 30 second stretch      Knee/Hip Exercises: Supine   Heel Slides Strengthening;AROM;Right;10 reps    Heel Slides Limitations with light therapist resistance    Bridges Strengthening;AROM;1 set;Limitations   8 reps    Bridges Limitations single leg bridging - lifting up into bridge first and then gently lifting LLE off mat for incr weight bearing through RLE - therapist making sure ankle stays in proper aligment, gf edie video taping how to perform exercise for pt to do at home, educated and demonstrated how to stabilize pt's R ankle. then performed bridging and gently lifting LLE off of mat table with min A     Other Supine Knee/Hip Exercises in hooklying with red tband: hip ABD and  then hip ADD back to midline with therapist providing minimal resistance x10 reps RLE    Other Supine Knee/Hip Exercises in hooklying, with therapist's hands on outside of pt's distal thighs performed isometric hip ABD B with 3 second holds x10 reps                  PT Education - 06/13/20 1331    Education Details added single leg bridging to HEP (lifting into bridge and then gently marching up LLE) - pts gf [name] took Architectural technologist) Educated Patient;Other (comment)   pt's gf   Methods Explanation;Demonstration    Comprehension Verbalized understanding  PT Short Term Goals - 06/03/20 1050      PT SHORT TERM GOAL #1   Title Pt will be mod I with all transfers with use of right AFO once he receives it for improved mobility.    Baseline 05/17/20 supervision/CGA    Time 4    Period Weeks    Status Revised    Target Date 07/03/20      PT SHORT TERM GOAL #2   Title Pt will be able to ambulate > 345' with RW with right AFO and hand grip attachment min assist for improved mobility.    Baseline 230' min assist with RW and right AFO on 06/03/20    Time 4    Period Weeks    Status New    Target Date 07/03/20      PT SHORT TERM GOAL #3   Title Pt will decrease 5 x sit to stand from 17.98 sec to <15 sec for improved balance and functional strength.    Baseline 06/03/20 17.98 sec from mat with hand    Time 4    Period Weeks    Status New    Target Date 07/03/20             PT Long Term Goals - 06/03/20 1057      PT LONG TERM GOAL #1   Title Pt and pt's family members will be independent with final HEP in order to build upon functional gains made in therapy. ALL LTGS DUE 09/01/20    Baseline Pt has been performing current HEP with family but PT continues to add to it 06/03/20    Time 12    Period Weeks    Status On-going    Target Date 09/01/20      PT LONG TERM GOAL #2   Title Pt will ambulate >500' on varied surfaces supervision with RW and R AFO for  improved household mobility and short community distances.    Baseline 230' with RW with right hand grip attachment and R AFO min assist on 06/03/20    Time 12    Period Weeks    Status Revised    Target Date 09/01/20      PT LONG TERM GOAL #3   Title Pt will decrease TUG from 27 sec to <20 sec for improved balance and decreased fall risk.    Baseline 06/03/20 TUG=27 sec with RW    Time 12    Period Weeks    Status New    Target Date 09/01/20      PT LONG TERM GOAL #4   Title Pt will be able to ambulate up/down curb with RW and R AFO CGA for improved community access.    Time 12    Period Weeks    Status New    Target Date 09/01/20                 Plan - 06/13/20 1345    Clinical Impression Statement Today's skilled session focused on RLE NMR, RLE>LLE strengthening and gait training with R AFO and RW with R hand orthosis. Pt continues to have incr supination/inversion during gait with RLE, needing min A today for proper foot placement for heel strike in order to improve R foot clearance. Will continue to progress towards LTGs.    Personal Factors and Comorbidities Comorbidity 3+    Comorbidities dense L MCA CVA, paroxysmal A. fib on chronic Xarelto, nonischemic cardiomyopathy and hypertension.    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, works as an Electrical engineer Evolving/Moderate complexity    Rehab Potential Good    PT Frequency 2x / week    PT Duration 12 weeks    PT Treatment/Interventions ADLs/Self Care Home Management;Aquatic Therapy;DME Instruction;Gait training;Stair training;Functional mobility training;Therapeutic activities;Therapeutic exercise;Electrical Stimulation;Balance training;Neuromuscular re-education;Wheelchair mobility training;Orthotic  Fit/Training;Patient/family education;Passive range of motion;Energy conservation    PT Next Visit Plan pt will get botox next week and will receive his AFO next week. Dr. Posey Pronto incr his baclofen for home. practice stand pivot transfers. Continue with RLE strengthening, weight shifting/NMR to RLE. continue gait training with RW, AFO with t-strap and hand orthotic.    Consulted and Agree with Plan of Care Patient;Family member/caregiver    Family Member Consulted brother-in-law, [name]           Patient will benefit from skilled therapeutic intervention in order to improve the following deficits and impairments:  Abnormal gait, Decreased activity tolerance, Decreased balance, Decreased cognition, Decreased mobility, Decreased coordination, Decreased range of motion, Decreased endurance, Decreased strength, Hypomobility, Difficulty walking, Impaired UE functional use, Impaired vision/preception, Postural dysfunction, Impaired sensation  Visit Diagnosis: Hemiplegia and hemiparesis following cerebral infarction affecting right dominant side (HCC)  Muscle weakness (generalized)  Other lack of coordination  Other symptoms and signs involving the nervous system  Unsteadiness on feet     Problem List Patient Active Problem List   Diagnosis Date Noted  . Benign essential HTN 06/09/2020  . Abnormality of gait 04/28/2020  . Neurogenic bladder 03/17/2020  . Sepsis due to pneumonia ([place]) 03/06/2020  . History of stroke with residual deficit 03/06/2020  . Transient hypotension 03/06/2020  . Spastic hemiplegia affecting nondominant side ([place])   . History of hypertension   . Expressive aphasia   . AKI (acute kidney injury) ([name])   . Global aphasia   . Hypoalbuminemia due to protein-calorie malnutrition ([place])   . Dysphagia, post-stroke   . Hyperlipidemia 02/02/2020  . Dysphagia following cerebral infarction 02/02/2020  . Aspiration pneumonia ([place]) 02/02/2020  . Acute ischemic left MCA  stroke ([place]) 02/02/2020  . Acute respiratory failure ([place])   . Acute ischemic stroke [facility]) s/p clot retrieval L MCA & ACA A3 01/27/2020  . Aspiration into airway 01/27/2020  . Chronic anticoagulation 01/27/2020  . Paroxysmal atrial fibrillation ([place]) 06/15/2018  . Essential hypertension 06/15/2018  . NICM (nonischemic cardiomyopathy) ([place]) 06/15/2018  . Visit for monitoring Tikosyn therapy 06/12/2018    Arliss Journey, PT, DPT  06/13/2020, 1:49 PM  [facility] [address] [place], [place], [address] Phone: [phone]   Fax:  [phone]  Name: Dustin Schneider MRN: [id number] Date of Birth: April 22, 1956

## 2020-06-13 NOTE — Telephone Encounter (Signed)
Per Dr. Posey Pronto increase  Baclofen to 20 mg TID. (Patient's bother-in-law informed).   Patient will need a new Rx sent to pharmacy for dose change. Please send Rx. Thank you.

## 2020-06-14 MED ORDER — BACLOFEN 20 MG PO TABS: 20.0000 mg | ORAL_TABLET | Freq: Three times a day (TID) | ORAL | 1 refills | Status: DC

## 2020-06-14 NOTE — Addendum Note (Signed)
Addended by: Delice Lesch A on: 06/14/2020 08:07 AM   Modules accepted: Orders

## 2020-06-17 ENCOUNTER — Ambulatory Visit: Payer: BC Managed Care – PPO | Admitting: Occupational Therapy

## 2020-06-17 ENCOUNTER — Other Ambulatory Visit: Payer: Self-pay

## 2020-06-17 ENCOUNTER — Ambulatory Visit: Payer: BC Managed Care – PPO

## 2020-06-17 DIAGNOSIS — R2689 Other abnormalities of gait and mobility: Secondary | ICD-10-CM

## 2020-06-17 DIAGNOSIS — I69351 Hemiplegia and hemiparesis following cerebral infarction affecting right dominant side: Secondary | ICD-10-CM

## 2020-06-17 DIAGNOSIS — R278 Other lack of coordination: Secondary | ICD-10-CM

## 2020-06-17 DIAGNOSIS — M6281 Muscle weakness (generalized): Secondary | ICD-10-CM

## 2020-06-17 DIAGNOSIS — R29818 Other symptoms and signs involving the nervous system: Secondary | ICD-10-CM

## 2020-06-17 DIAGNOSIS — I69318 Other symptoms and signs involving cognitive functions following cerebral infarction: Secondary | ICD-10-CM

## 2020-06-17 NOTE — Therapy (Signed)
[facility] [address] [place], [place], [address] Phone: [phone]   Fax:  [phone]  Physical Therapy Treatment  Patient Details  Name: Dustin Schneider MRN: [id number] Date of Birth: 1955/09/19 Referring Provider (PT): Lauraine Rinne, PA-C (followed by Dr. Delice Lesch)   Encounter Date: 06/17/2020   PT End of Session - 06/17/20 0939    Visit Number 28    Number of Visits 48    Date for PT Re-Evaluation 09/01/20    Authorization Type BCBS    PT Start Time [phone]    PT Stop Time 1015    PT Time Calculation (min) 39 min    Equipment Utilized During Treatment Gait belt    Activity Tolerance Patient tolerated treatment well    Behavior During Therapy [facility] for tasks assessed/performed           Past Medical History:  Diagnosis Date  . Hypertension   . NICM (nonischemic cardiomyopathy) ([place]) 06/15/2018   04/23/18: EF 45%, 09/15/18: NOrmal LVEF  . Paroxysmal atrial fibrillation ([place]) 06/15/2018  . Stroke ([place])   . Visit for monitoring Tikosyn therapy 06/12/2018    Past Surgical History:  Procedure Laterality Date  . BUBBLE STUDY  03/11/2020   Procedure: BUBBLE STUDY;  Surgeon: [name], MD;  Location: [place];  Service: Cardiovascular;;  . CARDIOVERSION N/A 05/13/2018   Procedure: CARDIOVERSION;  Surgeon: [name], MD;  Location: [facility] ENDOSCOPY;  Service: Cardiovascular;  Laterality: N/A;  . CARDIOVERSION N/A 06/13/2018   Procedure: CARDIOVERSION;  Surgeon: [name], MD;  Location: [facility] ENDOSCOPY;  Service: Cardiovascular;  Laterality: N/A;  . CARDIOVERSION N/A 06/23/2018   Procedure: CARDIOVERSION;  Surgeon: [name], MD;  Location: [facility] ENDOSCOPY;  Service: Cardiovascular;  Laterality: N/A;  . CARDIOVERSION N/A 03/11/2020   Procedure: CARDIOVERSION;  Surgeon: [name], MD;  Location: [place];  Service: Cardiovascular;  Laterality: N/A;  . excision of actinic keratosis    . EYE SURGERY     eye  lift  . IR CT HEAD LTD  01/27/2020  . IR PERCUTANEOUS ART THROMBECTOMY/INFUSION INTRACRANIAL INC DIAG ANGIO  01/27/2020      . IR PERCUTANEOUS ART THROMBECTOMY/INFUSION INTRACRANIAL INC DIAG ANGIO  01/27/2020  . RADIOLOGY WITH ANESTHESIA N/A 01/27/2020   Procedure: IR WITH ANESTHESIA;  Surgeon: Radiologist, Medication, MD;  Location: [place];  Service: Radiology;  Laterality: N/A;  . TEE WITHOUT CARDIOVERSION N/A 03/11/2020   Procedure: TRANSESOPHAGEAL ECHOCARDIOGRAM (TEE);  Surgeon: [name], MD;  Location: [place];  Service: Cardiovascular;  Laterality: N/A;  . TONSILLECTOMY    . WISDOM TOOTH EXTRACTION      There were no vitals filed for this visit.   Subjective Assessment - 06/17/20 0939    Subjective Pt reports doing well. Brother-in-law reports that pt did stand without air caste though and they were having some disagreement without. Pt getting AFO on Monday. Brother-in-law also asking about step negotiation possibility for in home as has 2 steps from kitchen to living room with no rail. Currently using ramp.    Patient is accompained by: Family member   sister   Pertinent History PMH: HTN, paroxysmal a fib    Limitations Standing;Walking    Patient Stated Goals "wants everything working on R side again"    Currently in Pain? No/denies                             [facility] Adult PT  Treatment/Exercise - 06/17/20 0941      Transfers   Transfers Sit to Stand;Stand to Sit    Sit to Stand 5: Supervision    Stand to Sit 5: Supervision      Ambulation/Gait   Ambulation/Gait Yes    Ambulation/Gait Assistance 4: Min assist    Ambulation/Gait Assistance Details Pt was given verbal and tactile cues to stand up tall and focus on right foot placement. As fatigued pt started to get more ER at right leg and decreased hip flexion. During second bout PT provided assist at pelvis for more anterior pelvic rotation on right.    Ambulation Distance (Feet) 230 Feet   115' x 1    Assistive device Rolling walker   right hand grip attachment, right AFO with t-strap   Gait Pattern Step-through pattern;Decreased hip/knee flexion - right    Ambulation Surface Level;Indoor      Therapeutic Activites    Therapeutic Activities Other Therapeutic Activities    Other Therapeutic Activities Pt with new shoe buttons for anchoring laces from OT. Pt is unable to donn right shoe with use and made AFO difficult to donn. Discussed with OT, [name], and she thought would be most helpful on left shoe at this time.      Neuro Re-ed    Neuro Re-ed Details  At walker: step-ups on 4" step with LLE with focus on them lifting right foot to clear x 6 with stepping back first with RLE. Standing with LLE on 4" step to increase right weight shift then raising up LUE x 10 CGA with tactile cues at knee. Stepping back and then forward with RLE x 10 with CGA/min assist for placement and cues to keep trunk up                  PT Education - 06/17/20 1532    Education Details Pt was instructed that when he gets new AFO Monday afternoon this does not meet to try walking at home with family yet and that we would need to practice with new brace in therapy with family first.    Person(s) Educated Patient;Caregiver(s)    Methods Explanation    Comprehension Verbalized understanding            PT Short Term Goals - 06/03/20 1050      PT SHORT TERM GOAL #1   Title Pt will be mod I with all transfers with use of right AFO once he receives it for improved mobility.    Baseline 05/17/20 supervision/CGA    Time 4    Period Weeks    Status Revised    Target Date 07/03/20      PT SHORT TERM GOAL #2   Title Pt will be able to ambulate > 345' with RW with right AFO and hand grip attachment min assist for improved mobility.    Baseline 230' min assist with RW and right AFO on 06/03/20    Time 4    Period Weeks    Status New    Target Date 07/03/20      PT SHORT TERM GOAL #3   Title Pt will  decrease 5 x sit to stand from 17.98 sec to <15 sec for improved balance and functional strength.    Baseline 06/03/20 17.98 sec from mat with hand    Time 4    Period Weeks    Status New    Target Date 07/03/20  PT Long Term Goals - 06/03/20 1057      PT LONG TERM GOAL #1   Title Pt and pt's family members will be independent with final HEP in order to build upon functional gains made in therapy. ALL LTGS DUE 09/01/20    Baseline Pt has been performing current HEP with family but PT continues to add to it 06/03/20    Time 12    Period Weeks    Status On-going    Target Date 09/01/20      PT LONG TERM GOAL #2   Title Pt will ambulate >500' on varied surfaces supervision with RW and R AFO for improved household mobility and short community distances.    Baseline 230' with RW with right hand grip attachment and R AFO min assist on 06/03/20    Time 12    Period Weeks    Status Revised    Target Date 09/01/20      PT LONG TERM GOAL #3   Title Pt will decrease TUG from 27 sec to <20 sec for improved balance and decreased fall risk.    Baseline 06/03/20 TUG=27 sec with RW    Time 12    Period Weeks    Status New    Target Date 09/01/20      PT LONG TERM GOAL #4   Title Pt will be able to ambulate up/down curb with RW and R AFO CGA for improved community access.    Time 12    Period Weeks    Status New    Target Date 09/01/20                 Plan - 06/17/20 1533    Clinical Impression Statement Pt was able to demonstrate better right foot clearance when he slowed down more with gait and maintained more upright posture. PT focused more on right hip activation with stepping activities today.    Personal Factors and Comorbidities Comorbidity 3+    Comorbidities dense L MCA CVA, paroxysmal A. fib on chronic Xarelto, nonischemic cardiomyopathy and hypertension.    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, works as an Electrical engineer Evolving/Moderate complexity    Rehab Potential Good    PT Frequency 2x / week    PT Duration 12 weeks    PT Treatment/Interventions ADLs/Self Care Home Management;Aquatic Therapy;DME Instruction;Gait training;Stair training;Functional mobility training;Therapeutic activities;Therapeutic exercise;Electrical Stimulation;Balance training;Neuromuscular re-education;Wheelchair mobility training;Orthotic Fit/Training;Patient/family education;Passive range of motion;Energy conservation    PT Next Visit Plan pt will get botox next week and will receive his AFO on Monday afternoon.  practice stand pivot transfers. Continue with RLE strengthening, weight shifting/NMR to RLE. continue gait training with RW, AFO with t-strap and hand orthotic.    Consulted and Agree with Plan of Care Patient;Family member/caregiver    Family Member Consulted brother-in-law, [name]           Patient will benefit from skilled therapeutic intervention in order to improve the following deficits and impairments:  Abnormal gait, Decreased activity tolerance, Decreased balance, Decreased cognition, Decreased mobility, Decreased coordination, Decreased range of motion, Decreased endurance, Decreased strength, Hypomobility, Difficulty walking, Impaired UE functional use, Impaired vision/preception, Postural dysfunction, Impaired sensation  Visit Diagnosis: Other abnormalities of gait and mobility  Muscle weakness (generalized)  Hemiplegia and hemiparesis following cerebral infarction affecting right dominant side [facility])     Problem List Patient Active Problem  List   Diagnosis Date Noted  . Benign essential HTN 06/09/2020  . Abnormality of gait 04/28/2020  . Neurogenic bladder  03/17/2020  . Sepsis due to pneumonia ([place]) 03/06/2020  . History of stroke with residual deficit 03/06/2020  . Transient hypotension 03/06/2020  . Spastic hemiplegia affecting nondominant side ([place])   . History of hypertension   . Expressive aphasia   . AKI (acute kidney injury) ([place])   . Global aphasia   . Hypoalbuminemia due to protein-calorie malnutrition ([place])   . Dysphagia, post-stroke   . Hyperlipidemia 02/02/2020  . Dysphagia following cerebral infarction 02/02/2020  . Aspiration pneumonia ([place]) 02/02/2020  . Acute ischemic left MCA stroke ([place]) 02/02/2020  . Acute respiratory failure ([place])   . Acute ischemic stroke [facility]) s/p clot retrieval L MCA & ACA A3 01/27/2020  . Aspiration into airway 01/27/2020  . Chronic anticoagulation 01/27/2020  . Paroxysmal atrial fibrillation ([place]) 06/15/2018  . Essential hypertension 06/15/2018  . NICM (nonischemic cardiomyopathy) ([place]) 06/15/2018  . Visit for monitoring Tikosyn therapy 06/12/2018    Electa Sniff, PT, DPT, NCS 06/17/2020, 3:35 PM  [facility] [address] [place], [place], [address] Phone: [phone]   Fax:  [phone]  Name: Dustin Schneider MRN: [id number] Date of Birth: 02/22/1956

## 2020-06-17 NOTE — Therapy (Signed)
[facility] [address] [place], [place], [address] Phone: [phone]   Fax:  [phone]  Occupational Therapy Treatment  Patient Details  Name: Dustin Schneider MRN: [id number] Date of Birth: September 01, 1955 No data recorded  Encounter Date: 06/17/2020   OT End of Session - 06/17/20 1046    Visit Number 25    Number of Visits 41    Date for OT Re-Evaluation 08/19/20    Authorization Type BC/BS - No auth required, covered 100%    OT Start Time 1016    OT Stop Time 1100    OT Time Calculation (min) 44 min    Activity Tolerance Patient tolerated treatment well    Behavior During Therapy WFL for tasks assessed/performed           Past Medical History:  Diagnosis Date  . Hypertension   . NICM (nonischemic cardiomyopathy) ([place]) 06/15/2018   04/23/18: EF 45%, 09/15/18: NOrmal LVEF  . Paroxysmal atrial fibrillation ([place]) 06/15/2018  . Stroke ([place])   . Visit for monitoring Tikosyn therapy 06/12/2018    Past Surgical History:  Procedure Laterality Date  . BUBBLE STUDY  03/11/2020   Procedure: BUBBLE STUDY;  Surgeon: [name], MD;  Location: [facility];  Service: Cardiovascular;;  . CARDIOVERSION N/A 05/13/2018   Procedure: CARDIOVERSION;  Surgeon: [name], MD;  Location: [facility] ENDOSCOPY;  Service: Cardiovascular;  Laterality: N/A;  . CARDIOVERSION N/A 06/13/2018   Procedure: CARDIOVERSION;  Surgeon: [name], MD;  Location: [facility] ENDOSCOPY;  Service: Cardiovascular;  Laterality: N/A;  . CARDIOVERSION N/A 06/23/2018   Procedure: CARDIOVERSION;  Surgeon: [name], MD;  Location: [facility] ENDOSCOPY;  Service: Cardiovascular;  Laterality: N/A;  . CARDIOVERSION N/A 03/11/2020   Procedure: CARDIOVERSION;  Surgeon: [name], MD;  Location: [place];  Service: Cardiovascular;  Laterality: N/A;  . excision of actinic keratosis    . EYE SURGERY     eye lift  . IR CT HEAD LTD  01/27/2020  . IR PERCUTANEOUS ART  THROMBECTOMY/INFUSION INTRACRANIAL INC DIAG ANGIO  01/27/2020      . IR PERCUTANEOUS ART THROMBECTOMY/INFUSION INTRACRANIAL INC DIAG ANGIO  01/27/2020  . RADIOLOGY WITH ANESTHESIA N/A 01/27/2020   Procedure: IR WITH ANESTHESIA;  Surgeon: Radiologist, Medication, MD;  Location: [place];  Service: Radiology;  Laterality: N/A;  . TEE WITHOUT CARDIOVERSION N/A 03/11/2020   Procedure: TRANSESOPHAGEAL ECHOCARDIOGRAM (TEE);  Surgeon: [name], MD;  Location: [place];  Service: Cardiovascular;  Laterality: N/A;  . TONSILLECTOMY    . WISDOM TOOTH EXTRACTION      There were no vitals filed for this visit.   Subjective Assessment - 06/17/20 1030    Subjective  dennies pain    Patient is accompanied by: Family member    Pertinent History Lt MCA CVA 01/27/20, readmitted to hospital with HAP and fever on 03/06/20 and released 03/11/20. PMH: paroxsymal A-fib, HTN    Limitations fall    Currently in Pain? No/denies                 Treatment: discussion with pt/ brother regarding importance of allowing pt to assist with ADLs as much as possible. Pt donned long sleeve shirt over his head with set up today. Pt is unable to donn R shoe independently due to spasticity and brace, however pt/ brother report pt is able to donn left shoe with shoe buttons. Pt asked what areas MD  will be botoxing, from Dr. Serita Grit note it appears as though he  will be using box on on foream wrist, hand/ thumb to address flexor spasticity, pt was made awae. Closed chain shoulder flexion, mid range seated , min v.c Gentle joint mobs to right wrist. Sitting at table, pt placed large and medium pegs in semi circle, mod facilitation/ v.c for positioning/ performance                OT Short Term Goals - 06/13/20 1245      OT SHORT TERM GOAL #1   Title Independent with initial HEP    Time 4    Period Weeks    Status Achieved      OT SHORT TERM GOAL #2   Title Pt to perform low to mid level reaching of light  objects RUE in prep for feeding self and bathing    Time 4    Period Weeks    Status Partially Met   low level, inconsistent mid level     OT SHORT TERM GOAL #3   Title Pt to feed self 50% of the time with Rt dominant hand and A/E prn    Time 4    Period Weeks    Status On-going   06/13/20: Pt reports eating yogurt/breakfast and some finger foods, but using Lt hand for lunch/dinner)     OT SHORT TERM GOAL #4   Title Pt to perform UE dressing of pullover shirt mod I level    Time 4    Period Weeks    Status Achieved   met per pt and brother     OT SHORT TERM GOAL #5   Title Pt to perform LE dressing and all bathing with no more than mod assist using A/E and strategies prn    Time 4    Period Weeks    Status Achieved   04/20/20:  set-up for bedside bath, mod A for LE dressing     OT SHORT TERM GOAL #6   Title Pt to perform toileting and toilet transfers with no more than min assist overall    Time 4    Period Weeks    Status Achieved   supervision     OT SHORT TERM GOAL #7   Title Improve functional use of RUE as evidenced by performing 8 blocks on Box & Blocks test    Baseline 0    Time 4    Period Weeks    Status Achieved   04/20/20:  6 blocks, 05/05/20: 12 blocks            OT Long Term Goals - 06/13/20 1248      OT LONG TERM GOAL #1   Title Pt to be independent with updated HEP    Time 12    Period Weeks    Status On-going      OT LONG TERM GOAL #2   Title Pt to perform all BADLS at mod I level    Time 12    Period Weeks    Status On-going   min assist for LE dressing     OT LONG TERM GOAL #3   Title Pt to feed self 50% or greater with Rt dominant hand and grooming 25% or more with Rt hand    Time 12    Period Weeks    Status Revised      OT LONG TERM GOAL #4   Title Pt to retrieve light weight objects from high shelf RUE consistently    Time 12  Period Weeks    Status On-going      OT LONG TERM GOAL #5   Title Pt to demo 25 lbs grip strength or more  Rt dominant hand for gripping, opening jars/containers    Time 12    Period Weeks    Status On-going   12.5 lbs     OT LONG TERM GOAL #6   Title Pt to perform simple meal prep and light cleaning tasks mod I level    Time 12    Period Weeks    Status Deferred   deferred d/t inability to stand/ambulate without assist and AFO     OT LONG TERM GOAL #7   Title Improve functional use RUE as evidenced by performing 25 blocks on Box & Blocks    Time 12    Period Weeks    Status Revised   12 blocks     OT LONG TERM GOAL #8   Title Pt to returned to modified art/sculpting tasks with task modifications and A/E prn    Time 12    Period Weeks    Status Deferred                 Plan - 06/17/20 1111    Clinical Impression Statement Pt is progressing towards goals. [name] brother reports still asssiting pt with UB dressing. Pt donned shirt with setup today. Pt/ brother were encouraged to perfrom at home.    Occupational performance deficits (Please refer to evaluation for details): ADL's;IADL's;Work;Leisure;Social Participation    Body Structure / Function / Physical Skills ADL;ROM;Dexterity;Balance;IADL;Body mechanics;Improper spinal/pelvic alignment;Sensation;Mobility;Flexibility;Strength;Coordination;FMC;Tone;UE functional use;Decreased knowledge of use of DME;Proprioception    Rehab Potential Good    Comorbidities Affecting Occupational Performance: May have comorbidities impacting occupational performance    OT Frequency 2x / week    OT Duration 8 weeks   additional 8 weeks beginning week of 06/20/20   OT Treatment/Interventions Self-care/ADL training;Therapeutic exercise;Functional Mobility Training;Neuromuscular education;Manual Therapy;Splinting;Aquatic Therapy;Manual lymph drainage;Therapeutic activities;Coping strategies training;DME and/or AE instruction;Electrical Stimulation;Fluidtherapy;Passive range of motion;Patient/family education    Plan continue NMR    Consulted and Agree  with Plan of Care Patient    Family Member Consulted brother           Patient will benefit from skilled therapeutic intervention in order to improve the following deficits and impairments:   Body Structure / Function / Physical Skills: ADL, ROM, Dexterity, Balance, IADL, Body mechanics, Improper spinal/pelvic alignment, Sensation, Mobility, Flexibility, Strength, Coordination, FMC, Tone, UE functional use, Decreased knowledge of use of DME, Proprioception       Visit Diagnosis: Hemiplegia and hemiparesis following cerebral infarction affecting right dominant side (HCC)  Other lack of coordination  Other symptoms and signs involving the nervous system  Muscle weakness (generalized)  Other symptoms and signs involving cognitive functions following cerebral infarction    Problem List Patient Active Problem List   Diagnosis Date Noted  . Benign essential HTN 06/09/2020  . Abnormality of gait 04/28/2020  . Neurogenic bladder 03/17/2020  . Sepsis due to pneumonia ([place]) 03/06/2020  . History of stroke with residual deficit 03/06/2020  . Transient hypotension 03/06/2020  . Spastic hemiplegia affecting nondominant side ([place])   . History of hypertension   . Expressive aphasia   . AKI (acute kidney injury) ([place])   . Global aphasia   . Hypoalbuminemia due to protein-calorie malnutrition ([place])   . Dysphagia, post-stroke   . Hyperlipidemia 02/02/2020  . Dysphagia following cerebral infarction 02/02/2020  . Aspiration pneumonia ([place])  02/02/2020  . Acute ischemic left MCA stroke ([place]) 02/02/2020  . Acute respiratory failure ([place])   . Acute ischemic stroke [facility]) s/p clot retrieval L MCA & ACA A3 01/27/2020  . Aspiration into airway 01/27/2020  . Chronic anticoagulation 01/27/2020  . Paroxysmal atrial fibrillation ([place]) 06/15/2018  . Essential hypertension 06/15/2018  . NICM (nonischemic cardiomyopathy) ([place]) 06/15/2018  . Visit for monitoring Tikosyn therapy 06/12/2018     Romond Pipkins 06/17/2020, 11:13 AM  [facility] [address] [place], [place], [address] Phone: [phone]   Fax:  [phone]  Name: Dustin Schneider MRN: [id number] Date of Birth: 1956-03-11

## 2020-06-20 ENCOUNTER — Other Ambulatory Visit: Payer: Self-pay

## 2020-06-20 ENCOUNTER — Ambulatory Visit: Payer: BC Managed Care – PPO | Admitting: Speech Pathology

## 2020-06-20 ENCOUNTER — Encounter: Payer: Self-pay | Admitting: Physical Therapy

## 2020-06-20 ENCOUNTER — Ambulatory Visit: Payer: BC Managed Care – PPO | Admitting: Occupational Therapy

## 2020-06-20 ENCOUNTER — Ambulatory Visit: Payer: BC Managed Care – PPO | Admitting: Physical Therapy

## 2020-06-20 DIAGNOSIS — R278 Other lack of coordination: Secondary | ICD-10-CM

## 2020-06-20 DIAGNOSIS — I69351 Hemiplegia and hemiparesis following cerebral infarction affecting right dominant side: Secondary | ICD-10-CM

## 2020-06-20 DIAGNOSIS — R29818 Other symptoms and signs involving the nervous system: Secondary | ICD-10-CM

## 2020-06-20 DIAGNOSIS — M6281 Muscle weakness (generalized): Secondary | ICD-10-CM

## 2020-06-20 NOTE — Therapy (Signed)
[facility] [address] [place], [place], [address] Phone: [phone]   Fax:  [phone]  Occupational Therapy Treatment  Patient Details  Name: Dustin Schneider MRN: [id number] Date of Birth: [birth year] No data recorded  Encounter Date: 06/20/2020   OT End of Session - 06/20/20 1114    Visit Number 26    Number of Visits 41    Date for OT Re-Evaluation 08/19/20    Authorization Type BC/BS - No auth required, covered 100%    Authorization Time Period week 1/8 for renewal period    OT Start Time 1018    OT Stop Time 1100    OT Time Calculation (min) 42 min    Activity Tolerance Patient tolerated treatment well    Behavior During Therapy [facility] for tasks assessed/performed           Past Medical History:  Diagnosis Date  . Hypertension   . NICM (nonischemic cardiomyopathy) ([place]) 06/15/2018   04/23/18: EF 45%, 09/15/18: NOrmal LVEF  . Paroxysmal atrial fibrillation ([place]) 06/15/2018  . Stroke ([place])   . Visit for monitoring Tikosyn therapy 06/12/2018    Past Surgical History:  Procedure Laterality Date  . BUBBLE STUDY  03/11/2020   Procedure: BUBBLE STUDY;  Surgeon: [name], MD;  Location: [place];  Service: Cardiovascular;;  . CARDIOVERSION N/A 05/13/2018   Procedure: CARDIOVERSION;  Surgeon: [name], MD;  Location: [facility] ENDOSCOPY;  Service: Cardiovascular;  Laterality: N/A;  . CARDIOVERSION N/A 06/13/2018   Procedure: CARDIOVERSION;  Surgeon: [name], MD;  Location: [facility] ENDOSCOPY;  Service: Cardiovascular;  Laterality: N/A;  . CARDIOVERSION N/A 06/23/2018   Procedure: CARDIOVERSION;  Surgeon: [name], MD;  Location: [facility] ENDOSCOPY;  Service: Cardiovascular;  Laterality: N/A;  . CARDIOVERSION N/A 03/11/2020   Procedure: CARDIOVERSION;  Surgeon: [name], MD;  Location: [place];  Service: Cardiovascular;  Laterality: N/A;  . excision of actinic keratosis    . EYE SURGERY     eye lift  . IR CT  HEAD LTD  01/27/2020  . IR PERCUTANEOUS ART THROMBECTOMY/INFUSION INTRACRANIAL INC DIAG ANGIO  01/27/2020      . IR PERCUTANEOUS ART THROMBECTOMY/INFUSION INTRACRANIAL INC DIAG ANGIO  01/27/2020  . RADIOLOGY WITH ANESTHESIA N/A 01/27/2020   Procedure: IR WITH ANESTHESIA;  Surgeon: Radiologist, Medication, MD;  Location: [place];  Service: Radiology;  Laterality: N/A;  . TEE WITHOUT CARDIOVERSION N/A 03/11/2020   Procedure: TRANSESOPHAGEAL ECHOCARDIOGRAM (TEE);  Surgeon: [name], MD;  Location: [place];  Service: Cardiovascular;  Laterality: N/A;  . TONSILLECTOMY    . WISDOM TOOTH EXTRACTION      There were no vitals filed for this visit.   Subjective Assessment - 06/20/20 1032    Subjective  I get botox tomorrow    Patient is accompanied by: Family member    Pertinent History Lt MCA CVA 01/27/20, readmitted to hospital with HAP and fever on 03/06/20 and released 03/11/20. PMH: paroxsymal A-fib, HTN    Limitations fall    Currently in Pain? No/denies          Supine: chest press using PVC frame with min facilitation RUE to prevent compensations into sh abd and IR. Progressed to unilateral sh flexion RUE holding cone for visual input to keep from IR.  Elbow ext against gravity with min facilitation.  Seated: worked on wrist extension and radial deviation w/ cone for visual input followed by functionally combined movement to place cone on lower surface.  Simulated eating  w/ built up spoon and dried beans w/ focus on forearm supination and wrist movement for this. Placing checkers into Connect 4 slots (2 rows) with focus on correct pincer grasp for placement                        OT Short Term Goals - 06/13/20 1245      OT SHORT TERM GOAL #1   Title Independent with initial HEP    Time 4    Period Weeks    Status Achieved      OT SHORT TERM GOAL #2   Title Pt to perform low to mid level reaching of light objects RUE in prep for feeding self and bathing    Time 4     Period Weeks    Status Partially Met   low level, inconsistent mid level     OT SHORT TERM GOAL #3   Title Pt to feed self 50% of the time with Rt dominant hand and A/E prn    Time 4    Period Weeks    Status On-going   06/13/20: Pt reports eating yogurt/breakfast and some finger foods, but using Lt hand for lunch/dinner)     OT SHORT TERM GOAL #4   Title Pt to perform UE dressing of pullover shirt mod I level    Time 4    Period Weeks    Status Achieved   met per pt and brother     OT SHORT TERM GOAL #5   Title Pt to perform LE dressing and all bathing with no more than mod assist using A/E and strategies prn    Time 4    Period Weeks    Status Achieved   04/20/20:  set-up for bedside bath, mod A for LE dressing     OT SHORT TERM GOAL #6   Title Pt to perform toileting and toilet transfers with no more than min assist overall    Time 4    Period Weeks    Status Achieved   supervision     OT SHORT TERM GOAL #7   Title Improve functional use of RUE as evidenced by performing 8 blocks on Box & Blocks test    Baseline 0    Time 4    Period Weeks    Status Achieved   04/20/20:  6 blocks, 05/05/20: 12 blocks            OT Long Term Goals - 06/13/20 1248      OT LONG TERM GOAL #1   Title Pt to be independent with updated HEP    Time 12    Period Weeks    Status On-going      OT LONG TERM GOAL #2   Title Pt to perform all BADLS at mod I level    Time 12    Period Weeks    Status On-going   min assist for LE dressing     OT LONG TERM GOAL #3   Title Pt to feed self 50% or greater with Rt dominant hand and grooming 25% or more with Rt hand    Time 12    Period Weeks    Status Revised      OT LONG TERM GOAL #4   Title Pt to retrieve light weight objects from high shelf RUE consistently    Time 12    Period Weeks    Status On-going  OT LONG TERM GOAL #5   Title Pt to demo 25 lbs grip strength or more Rt dominant hand for gripping, opening jars/containers     Time 12    Period Weeks    Status On-going   12.5 lbs     OT LONG TERM GOAL #6   Title Pt to perform simple meal prep and light cleaning tasks mod I level    Time 12    Period Weeks    Status Deferred   deferred d/t inability to stand/ambulate without assist and AFO     OT LONG TERM GOAL #7   Title Improve functional use RUE as evidenced by performing 25 blocks on Box & Blocks    Time 12    Period Weeks    Status Revised   12 blocks     OT LONG TERM GOAL #8   Title Pt to returned to modified art/sculpting tasks with task modifications and A/E prn    Time 12    Period Weeks    Status Deferred                 Plan - 06/20/20 1115    Clinical Impression Statement Pt progressing towards goals and wrist motion has improved.    Occupational performance deficits (Please refer to evaluation for details): ADL's;IADL's;Work;Leisure;Social Participation    Body Structure / Function / Physical Skills ADL;ROM;Dexterity;Balance;IADL;Body mechanics;Improper spinal/pelvic alignment;Sensation;Mobility;Flexibility;Strength;Coordination;FMC;Tone;UE functional use;Decreased knowledge of use of DME;Proprioception    Rehab Potential Good    Comorbidities Affecting Occupational Performance: May have comorbidities impacting occupational performance    OT Frequency 2x / week    OT Duration 8 weeks   additional 8 weeks beginning week of 06/20/20   OT Treatment/Interventions Self-care/ADL training;Therapeutic exercise;Functional Mobility Training;Neuromuscular education;Manual Therapy;Splinting;Aquatic Therapy;Manual lymph drainage;Therapeutic activities;Coping strategies training;DME and/or AE instruction;Electrical Stimulation;Fluidtherapy;Passive range of motion;Patient/family education    Plan continue NMR and functional use RUE    Consulted and Agree with Plan of Care Patient    Family Member Consulted brother           Patient will benefit from skilled therapeutic intervention in order to  improve the following deficits and impairments:   Body Structure / Function / Physical Skills: ADL, ROM, Dexterity, Balance, IADL, Body mechanics, Improper spinal/pelvic alignment, Sensation, Mobility, Flexibility, Strength, Coordination, FMC, Tone, UE functional use, Decreased knowledge of use of DME, Proprioception       Visit Diagnosis: Hemiplegia and hemiparesis following cerebral infarction affecting right dominant side (HCC)  Other lack of coordination    Problem List Patient Active Problem List   Diagnosis Date Noted  . Benign essential HTN 06/09/2020  . Abnormality of gait 04/28/2020  . Neurogenic bladder 03/17/2020  . Sepsis due to pneumonia ([place]) 03/06/2020  . History of stroke with residual deficit 03/06/2020  . Transient hypotension 03/06/2020  . Spastic hemiplegia affecting nondominant side ([place])   . History of hypertension   . Expressive aphasia   . AKI (acute kidney injury) ([place])   . Global aphasia   . Hypoalbuminemia due to protein-calorie malnutrition ([place])   . Dysphagia, post-stroke   . Hyperlipidemia 02/02/2020  . Dysphagia following cerebral infarction 02/02/2020  . Aspiration pneumonia ([place]) 02/02/2020  . Acute ischemic left MCA stroke ([place]) 02/02/2020  . Acute respiratory failure ([place])   . Acute ischemic stroke [facility]) s/p clot retrieval L MCA & ACA A3 01/27/2020  . Aspiration into airway 01/27/2020  . Chronic anticoagulation 01/27/2020  . Paroxysmal atrial fibrillation ([place]) 06/15/2018  .  Essential hypertension 06/15/2018  . NICM (nonischemic cardiomyopathy) ([place]) 06/15/2018  . Visit for monitoring Tikosyn therapy 06/12/2018    Carey Bullocks, OTR/L 06/20/2020, 11:16 AM  [facility] [address] [place], [place], [address] Phone: [phone]   Fax:  [phone]  Name: Dustin Schneider MRN: [id number] Date of Birth: [birth year]

## 2020-06-20 NOTE — Therapy (Signed)
[facility] [address] [place], [place], [address] Phone: [phone]   Fax:  [phone]  Physical Therapy Treatment  Patient Details  Name: Dustin Schneider MRN: [id number] Date of Birth: Jan 30, 1956 Referring Provider (PT): Lauraine Rinne, PA-C (followed by Dr. Delice Lesch)   Encounter Date: 06/20/2020   PT End of Session - 06/20/20 2152    Visit Number 29    Number of Visits 48    Date for PT Re-Evaluation 09/01/20    Authorization Type BCBS    PT Start Time [phone]    PT Stop Time 0930    PT Time Calculation (min) 41 min    Equipment Utilized During Treatment Gait belt    Activity Tolerance Patient tolerated treatment well    Behavior During Therapy [facility] for tasks assessed/performed           Past Medical History:  Diagnosis Date  . Hypertension   . NICM (nonischemic cardiomyopathy) ([place]) 06/15/2018   04/23/18: EF 45%, 09/15/18: NOrmal LVEF  . Paroxysmal atrial fibrillation ([place]) 06/15/2018  . Stroke ([place])   . Visit for monitoring Tikosyn therapy 06/12/2018    Past Surgical History:  Procedure Laterality Date  . BUBBLE STUDY  03/11/2020   Procedure: BUBBLE STUDY;  Surgeon: [name], MD;  Location: [place];  Service: Cardiovascular;;  . CARDIOVERSION N/A 05/13/2018   Procedure: CARDIOVERSION;  Surgeon: [name], MD;  Location: [facility] ENDOSCOPY;  Service: Cardiovascular;  Laterality: N/A;  . CARDIOVERSION N/A 06/13/2018   Procedure: CARDIOVERSION;  Surgeon: [name], MD;  Location: [facility] ENDOSCOPY;  Service: Cardiovascular;  Laterality: N/A;  . CARDIOVERSION N/A 06/23/2018   Procedure: CARDIOVERSION;  Surgeon: [name], MD;  Location: [facility] ENDOSCOPY;  Service: Cardiovascular;  Laterality: N/A;  . CARDIOVERSION N/A 03/11/2020   Procedure: CARDIOVERSION;  Surgeon: [name], MD;  Location: [place];  Service: Cardiovascular;  Laterality: N/A;  . excision of actinic keratosis    . EYE SURGERY     eye  lift  . IR CT HEAD LTD  01/27/2020  . IR PERCUTANEOUS ART THROMBECTOMY/INFUSION INTRACRANIAL INC DIAG ANGIO  01/27/2020      . IR PERCUTANEOUS ART THROMBECTOMY/INFUSION INTRACRANIAL INC DIAG ANGIO  01/27/2020  . RADIOLOGY WITH ANESTHESIA N/A 01/27/2020   Procedure: IR WITH ANESTHESIA;  Surgeon: Radiologist, Medication, MD;  Location: [place];  Service: Radiology;  Laterality: N/A;  . TEE WITHOUT CARDIOVERSION N/A 03/11/2020   Procedure: TRANSESOPHAGEAL ECHOCARDIOGRAM (TEE);  Surgeon: [name], MD;  Location: [place];  Service: Cardiovascular;  Laterality: N/A;  . TONSILLECTOMY    . WISDOM TOOTH EXTRACTION      There were no vitals filed for this visit.   Subjective Assessment - 06/20/20 0852    Subjective Won't be picking up his brace today - it has been delayed due to supply chain issues. Unsure of when pt will be able to pick it up. Will be getting Botox tomorrow. No falls - nothing else new.    Patient is accompained by: Family member   sister   Pertinent History PMH: HTN, paroxysmal a fib    Limitations Standing;Walking    Patient Stated Goals "wants everything working on R side again"    Currently in Pain? No/denies                             [facility] Adult PT Treatment/Exercise - 06/20/20 8101      Transfers  Transfers Sit to Stand;Stand to Sit    Sit to Stand 5: Supervision    Stand to Sit 5: Supervision      Ambulation/Gait   Ambulation/Gait Yes    Ambulation/Gait Assistance 4: Min assist    Ambulation/Gait Assistance Details cues to slow down at times and focus on R foot placement. pt with improved foot clearance today during bouts of gait. when fatigued, needing assist from therapist at times for incr R hip flexion to clear R foot. cues intermittently for posture    Ambulation Distance (Feet) 230 Feet   x1, 345 x 1   Assistive device Rolling walker   R hand orthosis, R AFO with t strap   Gait Pattern Step-through pattern;Decreased hip/knee flexion -  right    Ambulation Surface Level;Indoor      Neuro Re-ed    Neuro Re-ed Details  at walker at edge of mat with R hand orthosis: step ups leading with LLE onto 4" step x10 reps with focus on clearing RLE , with 6" step  Tapping L foot to step for incr weight shifting to RLE 2 x 10 reps, with L foot on 4" step for modified SLS of RLE and incr weight shift - pt grabbing bean bag with LUE with therapist on pt's R and tossing bean bag into crate - needing cues at times to slow down and focus on weight shift x15 reps                    PT Short Term Goals - 06/03/20 1050      PT SHORT TERM GOAL #1   Title Pt will be mod I with all transfers with use of right AFO once he receives it for improved mobility.    Baseline 05/17/20 supervision/CGA    Time 4    Period Weeks    Status Revised    Target Date 07/03/20      PT SHORT TERM GOAL #2   Title Pt will be able to ambulate > 345' with RW with right AFO and hand grip attachment min assist for improved mobility.    Baseline 230' min assist with RW and right AFO on 06/03/20    Time 4    Period Weeks    Status New    Target Date 07/03/20      PT SHORT TERM GOAL #3   Title Pt will decrease 5 x sit to stand from 17.98 sec to <15 sec for improved balance and functional strength.    Baseline 06/03/20 17.98 sec from mat with hand    Time 4    Period Weeks    Status New    Target Date 07/03/20             PT Long Term Goals - 06/03/20 1057      PT LONG TERM GOAL #1   Title Pt and pt's family members will be independent with final HEP in order to build upon functional gains made in therapy. ALL LTGS DUE 09/01/20    Baseline Pt has been performing current HEP with family but PT continues to add to it 06/03/20    Time 12    Period Weeks    Status On-going    Target Date 09/01/20      PT LONG TERM GOAL #2   Title Pt will ambulate >500' on varied surfaces supervision with RW and R AFO for improved household mobility and short  community distances.  Baseline 230' with RW with right hand grip attachment and R AFO min assist on 06/03/20    Time 12    Period Weeks    Status Revised    Target Date 09/01/20      PT LONG TERM GOAL #3   Title Pt will decrease TUG from 27 sec to <20 sec for improved balance and decreased fall risk.    Baseline 06/03/20 TUG=27 sec with RW    Time 12    Period Weeks    Status New    Target Date 09/01/20      PT LONG TERM GOAL #4   Title Pt will be able to ambulate up/down curb with RW and R AFO CGA for improved community access.    Time 12    Period Weeks    Status New    Target Date 09/01/20                 Plan - 06/20/20 2154    Clinical Impression Statement Pt with continued improvements with gait today - able to ambulate longer distances today with improved R foot clearance, but did need min A at times for incr R hip flexion/foot placement towards end of bout/end of session when more fatigued. Remainder of session focused on NMR for weight shifitng towards RLE and step ups with LLE with focus on RLE foot clearance from 4" step with RW. Will continue to progress towards LTGs.    Personal Factors and Comorbidities Comorbidity 3+    Comorbidities dense L MCA CVA, paroxysmal A. fib on chronic Xarelto, nonischemic cardiomyopathy and hypertension.    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, works as an Electrical engineer Evolving/Moderate complexity    Rehab Potential Good    PT Frequency 2x / week    PT Duration 12 weeks    PT Treatment/Interventions ADLs/Self Care Home Management;Aquatic Therapy;DME Instruction;Gait training;Stair training;Functional mobility training;Therapeutic activities;Therapeutic exercise;Electrical Stimulation;Balance  training;Neuromuscular re-education;Wheelchair mobility training;Orthotic Fit/Training;Patient/family education;Passive range of motion;Energy conservation    PT Next Visit Plan pt's AFO has been delayed due to supply chain issues :( reaching out to chris about any more info. pt will need 10th visit PN!  practice stand pivot transfers. Continue with RLE strengthening, weight shifting/NMR to RLE. continue gait training with RW, AFO with t-strap and hand orthotic.    Consulted and Agree with Plan of Care Patient;Family member/caregiver    Family Member Consulted brother-in-law, [name]           Patient will benefit from skilled therapeutic intervention in order to improve the following deficits and impairments:  Abnormal gait, Decreased activity tolerance, Decreased balance, Decreased cognition, Decreased mobility, Decreased coordination, Decreased range of motion, Decreased endurance, Decreased strength, Hypomobility, Difficulty walking, Impaired UE functional use, Impaired vision/preception, Postural dysfunction, Impaired sensation  Visit Diagnosis: Hemiplegia and hemiparesis following cerebral infarction affecting right dominant side (HCC)  Other lack of coordination  Other symptoms and signs involving the nervous system  Muscle weakness (generalized)     Problem List Patient Active Problem List   Diagnosis Date Noted  . Benign essential HTN 06/09/2020  . Abnormality of gait 04/28/2020  . Neurogenic bladder 03/17/2020  . Sepsis due to pneumonia ([place]) 03/06/2020  . History of stroke with residual deficit 03/06/2020  . Transient hypotension 03/06/2020  . Spastic hemiplegia affecting nondominant side ([place])   . History of hypertension   . Expressive  aphasia   . AKI (acute kidney injury) ([place])   . Global aphasia   . Hypoalbuminemia due to protein-calorie malnutrition ([place])   . Dysphagia, post-stroke   . Hyperlipidemia 02/02/2020  . Dysphagia following cerebral infarction  02/02/2020  . Aspiration pneumonia ([place]) 02/02/2020  . Acute ischemic left MCA stroke ([place]) 02/02/2020  . Acute respiratory failure ([place])   . Acute ischemic stroke [facility]) s/p clot retrieval L MCA & ACA A3 01/27/2020  . Aspiration into airway 01/27/2020  . Chronic anticoagulation 01/27/2020  . Paroxysmal atrial fibrillation ([place]) 06/15/2018  . Essential hypertension 06/15/2018  . NICM (nonischemic cardiomyopathy) ([place]) 06/15/2018  . Visit for monitoring Tikosyn therapy 06/12/2018    Arliss Journey, PT, DPT  06/20/2020, 9:56 PM  [facility] [address] [place], [place], [address] Phone: [phone]   Fax:  [phone]  Name: Dustin Schneider MRN: [id number] Date of Birth: [birth year]

## 2020-06-21 ENCOUNTER — Encounter
Payer: BC Managed Care – PPO | Attending: Physical Medicine & Rehabilitation | Admitting: Physical Medicine & Rehabilitation

## 2020-06-21 ENCOUNTER — Encounter: Payer: Self-pay | Admitting: Physical Medicine & Rehabilitation

## 2020-06-21 ENCOUNTER — Other Ambulatory Visit: Payer: Self-pay

## 2020-06-21 VITALS — BP 124/84 | HR 50 | Temp 98.7°F | Ht 68.0 in | Wt 180.0 lb

## 2020-06-21 DIAGNOSIS — G8110 Spastic hemiplegia affecting unspecified side: Secondary | ICD-10-CM | POA: Diagnosis not present

## 2020-06-21 NOTE — Progress Notes (Addendum)
[facility]: Procedure Note Patient Name: Dustin Schneider DOB: 26-Mar-1956 MRN: [id number] Date: 06/21/20  Procedure: Botulinum toxin administration Guidance: EMG Diagnosis: Nondominant left spastic hemiparesis with dysesthesias Attending: Delice Lesch, MD    Trade name: Xeomin (incobotulinumtoxinA)  Informed consent: Risks, benefits & options of the procedure are explained to the patient (and/or family). The patient elects to proceed with procedure. Risks include but are not limited to weakness, respiratory distress, dry mouth, ptosis, antibody formation, worsening of some areas of function. Benefits include decreased abnormal muscle tone, improved hygiene and positioning, decreased skin breakdown and, in some cases, decreased pain. Options include conservative management with oral antispasticity agents, phenol chemodenervation of nerve or at motor nerve branches. More invasive options include intrathecal balcofen adminstration for appropriate candidates. Surgical options may include tendon lengthening or transposition or, rarely, dorsal rhizotomy.   History/Physical Examination: 64 y.o. malewith history of PAF/on Xarelto, and ICM presents for hospital follow up for left MCA infarct.  [name]:    Right wrist flexors 2/4, thumb adductor 1/4             Right ankle inverters 1+/4 Previous Treatments: Therapy/Range of motion Indication for guidance: Target active muscules  Procedure: Botulinum toxin was mixed with preservative free saline with a dilution of 1cc to 100 units. Targeted limb and muscles were identified. The skin was prepped with alcohol swabs and placement of needle tip in targeted muscle was confirmed using appropriate guidance. Prior to injection, positioning of needle tip outside of blood vessel was determined by pulling back on syringe plunger.  MUSCLE UNITS Right    FCR: 20 units  FCU: 20 units  Adductor Pollicis: 10 units  Flexor Hallucis Longus 25  Flexor Digitorum Longus  25  Total units used: 416 Complications: None  Plan: RTC 6 weeks  Dustin Schneider Dustin Schneider 11:42 AM

## 2020-06-22 ENCOUNTER — Ambulatory Visit: Payer: BC Managed Care – PPO | Admitting: Cardiology

## 2020-06-22 ENCOUNTER — Encounter: Payer: Self-pay | Admitting: Cardiology

## 2020-06-22 VITALS — BP 126/78 | HR 51 | Resp 16 | Ht 68.0 in | Wt 158.8 lb

## 2020-06-22 DIAGNOSIS — Z8673 Personal history of transient ischemic attack (TIA), and cerebral infarction without residual deficits: Secondary | ICD-10-CM

## 2020-06-22 DIAGNOSIS — I480 Paroxysmal atrial fibrillation: Secondary | ICD-10-CM

## 2020-06-22 DIAGNOSIS — I10 Essential (primary) hypertension: Secondary | ICD-10-CM

## 2020-06-22 NOTE — Progress Notes (Signed)
Primary Physician/Referring:  Marrian Salvage, FNP  Patient ID: Dustin Schneider, male    DOB: 10/02/1955, 64 y.o.   MRN: [id number]  Chief Complaint  Patient presents with  . Paroxysmal atrial fibrillation  . Follow-up    3 month + EKG   HPI:    Dustin Schneider  is a 64 y.o.  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 He was restarted on Tikosyn during his last hospitalization and also underwent TEE cardioversion.    He has been compliant with his medications including statins and obviously is not drinking alcohol anymore and has been compliant with his anticoagulants as well.  Is accompanied by his brother-in-law today.  No specific complaints except the spasticity especially involving his right feet has been a major issue and received Botox injections yesterday.  Past Medical History:  Diagnosis Date  . Hypertension   . NICM (nonischemic cardiomyopathy) ([place]) 06/15/2018   04/23/18: EF 45%, 09/15/18: NOrmal LVEF  . Paroxysmal atrial fibrillation ([place]) 06/15/2018  . Stroke ([place])   . Visit for monitoring Tikosyn therapy 06/12/2018   Past Surgical History:  Procedure Laterality Date  . BUBBLE STUDY  03/11/2020   Procedure: BUBBLE STUDY;  Surgeon: [name], MD;  Location: [place];  Service: Cardiovascular;;  . CARDIOVERSION N/A 05/13/2018   Procedure: CARDIOVERSION;  Surgeon: [name], MD;  Location: [facility] ENDOSCOPY;  Service: Cardiovascular;  Laterality: N/A;  . CARDIOVERSION N/A 06/13/2018   Procedure: CARDIOVERSION;  Surgeon: [name], MD;  Location: [facility] ENDOSCOPY;  Service: Cardiovascular;   Laterality: N/A;  . CARDIOVERSION N/A 06/23/2018   Procedure: CARDIOVERSION;  Surgeon: [name], MD;  Location: [facility] ENDOSCOPY;  Service: Cardiovascular;  Laterality: N/A;  . CARDIOVERSION N/A 03/11/2020   Procedure: CARDIOVERSION;  Surgeon: [name], MD;  Location: [place];  Service: Cardiovascular;  Laterality: N/A;  . excision of actinic keratosis    . EYE SURGERY     eye lift  . IR CT HEAD LTD  01/27/2020  . IR PERCUTANEOUS ART THROMBECTOMY/INFUSION INTRACRANIAL INC DIAG ANGIO  01/27/2020      . IR PERCUTANEOUS ART THROMBECTOMY/INFUSION INTRACRANIAL INC DIAG ANGIO  01/27/2020  . RADIOLOGY WITH ANESTHESIA N/A 01/27/2020   Procedure: IR WITH ANESTHESIA;  Surgeon: Radiologist, Medication, MD;  Location: [place];  Service: Radiology;  Laterality: N/A;  . TEE WITHOUT CARDIOVERSION N/A 03/11/2020   Procedure: TRANSESOPHAGEAL ECHOCARDIOGRAM (TEE);  Surgeon: [name], MD;  Location: [place];  Service: Cardiovascular;  Laterality: N/A;  . TONSILLECTOMY    . WISDOM TOOTH EXTRACTION     Family History  Problem Relation Age of Onset  . Glaucoma Mother   . Atrial fibrillation Father     Social History   Tobacco Use  . Smoking status: Never Smoker  . Smokeless tobacco: Never Used  Substance Use Topics  . Alcohol use: Not Currently   Marital Status: Divorced  ROS  Review of Systems  Constitutional: Negative for malaise/fatigue and weight loss.  Respiratory: Negative for cough, hemoptysis and shortness of breath.   Cardiovascular: Negative for chest pain, palpitations, claudication and leg swelling.  Gastrointestinal: Negative for blood in stool, heartburn and melena.  Genitourinary: Negative for dysuria.  Musculoskeletal: Negative for  joint pain and myalgias.  Neurological: Positive for speech change, focal weakness and weakness. Negative for dizziness, seizures and headaches.  Endo/Heme/Allergies: Does not bruise/bleed easily.  Psychiatric/Behavioral: Negative for depression.  The patient is not nervous/anxious.   All other systems reviewed and are negative.    Objective  Blood pressure 126/78, pulse (!) 51, resp. rate 16, height 5\' 8"  (1.727 m), weight 158 lb 12.8 oz (72 kg), SpO2 97 %.  Vitals with BMI 06/22/2020 06/21/2020 06/09/2020  Height 5\' 8"  5\' 8"  5\' 8"   Weight 158 lbs 13 oz 180 lbs (No Data)  BMI 97.98 92.11 -  Systolic 941 740 814  Diastolic 78 84 73  Pulse 51 50 62   Physical Exam Cardiovascular:     Rate and Rhythm: Normal rate and regular rhythm.     Pulses: Intact distal pulses.     Heart sounds: Normal heart sounds. No murmur heard.  No gallop.      Comments: No leg edema, no JVD. Pulmonary:     Effort: Pulmonary effort is normal.     Breath sounds: Normal breath sounds.  Abdominal:     General: Bowel sounds are normal.     Palpations: Abdomen is soft.  Neurological:     Gait: Gait abnormal (right spastic hemiplegia).     Laboratory examination:   Recent Labs    03/09/20 0656 03/10/20 0421 03/11/20 0138  NA 138 137 139  K 3.8 4.5 4.1  CL 102 104 103  CO2 25 24 26   GLUCOSE 116* 107* 111*  BUN 10 13 12   CREATININE 0.79 0.90 0.85  CALCIUM 8.3* 8.4* 8.6*  GFRNONAA >60 >60 >60  GFRAA >60 >60 >60   CrCl cannot be calculated (Patient's most recent lab result is older than the maximum 21 days allowed.).  CMP Latest Ref Rng & Units 03/11/2020 03/10/2020 03/09/2020  Glucose 70 - 99 mg/dL 111(H) 107(H) 116(H)  BUN 8 - 23 mg/dL 12 13 10   Creatinine 0.61 - 1.24 mg/dL 0.85 0.90 0.79  Sodium 135 - 145 mmol/L 139 137 138  Potassium 3.5 - 5.1 mmol/L 4.1 4.5 3.8  Chloride 98 - 111 mmol/L 103 104 102  CO2 22 - 32 mmol/L 26 24 25   Calcium 8.9 - 10.3 mg/dL 8.6(L) 8.4(L) 8.3(L)  Total Protein 6.5 - 8.1 g/dL - - -  Total Bilirubin 0.3 - 1.2 mg/dL - - -  Alkaline Phos 38 - 126 U/L - - -  AST 15 - 41 U/L - - -  ALT 0 - 44 U/L - - -   CBC Latest Ref Rng & Units 03/09/2020 03/07/2020 03/06/2020  WBC 4.0 - 10.5 K/uL 5.7 9.2 12.4(H)   Hemoglobin 13.0 - 17.0 g/dL 11.3(L) 11.3(L) 13.2  Hematocrit 39 - 52 % 34.7(L) 34.8(L) 40.6  Platelets 150 - 400 K/uL 234 180 201   Lipid Panel Recent Labs    12/17/19 0831 01/28/20 0716  CHOL 226* 125  TRIG 180* 298*  LDLCALC 154* 33  VLDL  --  60*  HDL 39* 32*  CHOLHDL  --  3.9    HEMOGLOBIN A1C Lab Results  Component Value Date   HGBA1C 5.6 01/28/2020   MPG 114 01/28/2020     TSH Recent Labs    12/17/19 0836  TSH 3.160   Medications and allergies  No Known Allergies   Current Outpatient Medications on File Prior to Visit  Medication Sig Dispense Refill  . apixaban (ELIQUIS) 5 MG TABS tablet Take 1 tablet (5  mg total) by mouth 2 (two) times daily. 60 tablet 3  . baclofen (LIORESAL) 20 MG tablet Take 1 tablet (20 mg total) by mouth 3 (three) times daily. 90 each 1  . diltiazem (CARDIZEM CD) 180 MG 24 hr capsule Take 1 capsule (180 mg total) by mouth daily. 30 capsule 3  . dofetilide (TIKOSYN) 500 MCG capsule Take 1 capsule (500 mcg total) by mouth 2 (two) times daily. 180 capsule 0  . metoprolol tartrate (LOPRESSOR) 50 MG tablet Take 1 tablet (50 mg total) by mouth 2 (two) times daily. 180 tablet 2  . rosuvastatin (CRESTOR) 10 MG tablet Take 1 tablet (10 mg total) by mouth daily after supper. 30 tablet 3   No current facility-administered medications on file prior to visit.    Radiology:   No results found.  Cardiac Studies:   Nuclear stress test  05/05/2018: 1. The resting electrocardiogram demonstrated atrial fibrillation. The stress electrocardiogram was positive for ischemia with 2 mm downsloping ST depression in the inferior and lateral leads, persisted for 2 mintues into recovery. Stress symptoms included palpitations and fatigue. Patient exercised on Bruce protocol for 6:00 minutes and achieved 7.05 METS. Stress test terminated due to fatigue and 113% MPHR achieved (Target HR >85%). 2. Left ventricular cavity is noted to be normal on the rest and stress  studies. SPECT images demonstrate homogeneous tracer distribution throughout the myocardium. The left ventricular ejection fraction was calculated to be 38%, but visually appears to be at least low normal without wall motion abnormality. This is an intermediate risk study due to abnormal ST change and low LVEF (difficult in view of gating with A. Fib), clinical correlation recommended.  Echocardiogram 01/28/2020: 1.The left ventricle has normal function. The left ventricle has no regional wall motion abnormalities. Left ventricular diastolic parameters are  consistent with Grade II diastolic dysfunction (pseudonormalization).  2. Right ventricular systolic function is normal. The right ventricular size is normal. There is mildly elevated pulmonary artery systolic pressure.  3. Left atrial size was moderately dilated.  4. Right atrial size was moderately dilated.  5. The mitral valve is normal in structure. Mild mitral valve regurgitation.  6. The aortic valve is normal in structure. Aortic valve regurgitation is mild to moderate.  7. The inferior vena cava is dilated in size with >50% respiratory variability, suggesting right atrial pressure of 8 mmHg.  EKG  EKG 06/22/2020: Sinus bradycardia at rate of 50 bpm, normal axis, otherwise normal EKG.  No significant change from 03/23/2019: Marked sinus bradycardia with rate of 54 bpm.  Assessment     ICD-10-CM   1. Paroxysmal atrial fibrillation (HCC)  I48.0 EKG 12-Lead  2. History of left MCA/ACA stroke with clot removal and right-sided residual hemiparesis and expressive aphasia  Z86.73   3. Essential hypertension  I10      No orders of the defined types were placed in this encounter.   Medications Discontinued During This Encounter  Medication Reason  . acetaminophen (TYLENOL) 325 MG tablet Patient Preference    Recommendations:   Dustin Schneider  is a 64 y.o. Caucasian male with paroxysmal atrial fibrillation, history of  cardioversion, currently on antiarrhythmic medications, left MCA/ACA CVA with residual right-sided spastic phemiparesis and expressive aphasia, chronic diastolic heart failure, hyperlipidemia, unfortunately had a stroke involving the left MCA/ACA territory back in June 2021 and is now status post clot removal as he was not compliant with anticoagulation. He was restarted on Tikosyn during his last hospitalization and also underwent TEE  cardioversion.    He has been compliant with his medications.  I reviewed his labs, his triglycerides were slightly elevated, he has repeat labs pending sometime in December.  Otherwise he is maintaining sinus rhythm, tolerating all his medications well, QT interval is normal.  He continues to do outpatient rehab and received Botox injection for his spastic right leg and also right hand.  Continues to have expressive aphasia, advised him to read books loudly to improve coordination.  Otherwise stable from cardiac standpoint I will see him back in 6 months.   [name], MD, [facility] 06/22/2020, 11:04 AM Office: [phone] Pager: [phone]

## 2020-06-24 ENCOUNTER — Ambulatory Visit: Payer: BC Managed Care – PPO

## 2020-06-24 ENCOUNTER — Ambulatory Visit: Payer: BC Managed Care – PPO | Admitting: Cardiology

## 2020-06-24 ENCOUNTER — Other Ambulatory Visit: Payer: Self-pay

## 2020-06-24 ENCOUNTER — Ambulatory Visit: Payer: BC Managed Care – PPO | Admitting: Occupational Therapy

## 2020-06-24 DIAGNOSIS — M6281 Muscle weakness (generalized): Secondary | ICD-10-CM

## 2020-06-24 DIAGNOSIS — I69351 Hemiplegia and hemiparesis following cerebral infarction affecting right dominant side: Secondary | ICD-10-CM

## 2020-06-24 DIAGNOSIS — R29818 Other symptoms and signs involving the nervous system: Secondary | ICD-10-CM

## 2020-06-24 DIAGNOSIS — R278 Other lack of coordination: Secondary | ICD-10-CM

## 2020-06-24 DIAGNOSIS — R2689 Other abnormalities of gait and mobility: Secondary | ICD-10-CM

## 2020-06-24 DIAGNOSIS — I69318 Other symptoms and signs involving cognitive functions following cerebral infarction: Secondary | ICD-10-CM

## 2020-06-24 NOTE — Therapy (Signed)
[place] [address] [place], [place], [address] Phone: [phone]   Fax:  [phone]  Occupational Therapy Treatment  Patient Details  Name: Dustin Schneider MRN: [id number] Date of Birth: Dec 04, 1955 No data recorded  Encounter Date: 06/24/2020   OT End of Session - 06/24/20 1137    Visit Number 27    Number of Visits 41    Date for OT Re-Evaluation 08/19/20    Authorization Type BC/BS - No auth required, covered 100%    Authorization Time Period week 1/8 for renewal period    OT Start Time 1018    OT Stop Time 1100    OT Time Calculation (min) 42 min    Activity Tolerance Patient tolerated treatment well    Behavior During Therapy [facility] for tasks assessed/performed           Past Medical History:  Diagnosis Date  . Hypertension   . NICM (nonischemic cardiomyopathy) ([place]) 06/15/2018   04/23/18: EF 45%, 09/15/18: NOrmal LVEF  . Paroxysmal atrial fibrillation ([place]) 06/15/2018  . Stroke ([place])   . Visit for monitoring Tikosyn therapy 06/12/2018    Past Surgical History:  Procedure Laterality Date  . BUBBLE STUDY  03/11/2020   Procedure: BUBBLE STUDY;  Surgeon: [name], MD;  Location: [place];  Service: Cardiovascular;;  . CARDIOVERSION N/A 05/13/2018   Procedure: CARDIOVERSION;  Surgeon: [name], MD;  Location: [facility] ENDOSCOPY;  Service: Cardiovascular;  Laterality: N/A;  . CARDIOVERSION N/A 06/13/2018   Procedure: CARDIOVERSION;  Surgeon: [name], MD;  Location: [facility] ENDOSCOPY;  Service: Cardiovascular;  Laterality: N/A;  . CARDIOVERSION N/A 06/23/2018   Procedure: CARDIOVERSION;  Surgeon: [name], MD;  Location: [facility] ENDOSCOPY;  Service: Cardiovascular;  Laterality: N/A;  . CARDIOVERSION N/A 03/11/2020   Procedure: CARDIOVERSION;  Surgeon: [name], MD;  Location: [place];  Service: Cardiovascular;  Laterality: N/A;  . excision of actinic keratosis    . EYE SURGERY     eye lift  . IR CT  HEAD LTD  01/27/2020  . IR PERCUTANEOUS ART THROMBECTOMY/INFUSION INTRACRANIAL INC DIAG ANGIO  01/27/2020      . IR PERCUTANEOUS ART THROMBECTOMY/INFUSION INTRACRANIAL INC DIAG ANGIO  01/27/2020  . RADIOLOGY WITH ANESTHESIA N/A 01/27/2020   Procedure: IR WITH ANESTHESIA;  Surgeon: Radiologist, Medication, MD;  Location: [place];  Service: Radiology;  Laterality: N/A;  . TEE WITHOUT CARDIOVERSION N/A 03/11/2020   Procedure: TRANSESOPHAGEAL ECHOCARDIOGRAM (TEE);  Surgeon: [name], MD;  Location: [place];  Service: Cardiovascular;  Laterality: N/A;  . TONSILLECTOMY    . WISDOM TOOTH EXTRACTION      There were no vitals filed for this visit.               Treatment: Supine closed chain shoulder flexion and chest press with PVC pipe frame, min facilitation, then unilateral triceps extension with RUE, min-mod facilitation. Seated weightbearing through RUE edge of mat, then closed chain midrange shoulder flexion min v.c Unilateral AA/ROM with UE ranger midrange shoulder flexion then low range shoulder abduction/ circumduction with focus on control, min v.c Functional  low range grasp / release of 1 inch blocks to place into bowl, min v.c pt demonstrates improved control when he slows down.             OT Short Term Goals - 06/13/20 1245      OT SHORT TERM GOAL #1   Title Independent with initial HEP    Time 4  Period Weeks    Status Achieved      OT SHORT TERM GOAL #2   Title Pt to perform low to mid level reaching of light objects RUE in prep for feeding self and bathing    Time 4    Period Weeks    Status Partially Met   low level, inconsistent mid level     OT SHORT TERM GOAL #3   Title Pt to feed self 50% of the time with Rt dominant hand and A/E prn    Time 4    Period Weeks    Status On-going   06/13/20: Pt reports eating yogurt/breakfast and some finger foods, but using Lt hand for lunch/dinner)     OT SHORT TERM GOAL #4   Title Pt to perform UE  dressing of pullover shirt mod I level    Time 4    Period Weeks    Status Achieved   met per pt and brother     OT SHORT TERM GOAL #5   Title Pt to perform LE dressing and all bathing with no more than mod assist using A/E and strategies prn    Time 4    Period Weeks    Status Achieved   04/20/20:  set-up for bedside bath, mod A for LE dressing     OT SHORT TERM GOAL #6   Title Pt to perform toileting and toilet transfers with no more than min assist overall    Time 4    Period Weeks    Status Achieved   supervision     OT SHORT TERM GOAL #7   Title Improve functional use of RUE as evidenced by performing 8 blocks on Box & Blocks test    Baseline 0    Time 4    Period Weeks    Status Achieved   04/20/20:  6 blocks, 05/05/20: 12 blocks            OT Long Term Goals - 06/13/20 1248      OT LONG TERM GOAL #1   Title Pt to be independent with updated HEP    Time 12    Period Weeks    Status On-going      OT LONG TERM GOAL #2   Title Pt to perform all BADLS at mod I level    Time 12    Period Weeks    Status On-going   min assist for LE dressing     OT LONG TERM GOAL #3   Title Pt to feed self 50% or greater with Rt dominant hand and grooming 25% or more with Rt hand    Time 12    Period Weeks    Status Revised      OT LONG TERM GOAL #4   Title Pt to retrieve light weight objects from high shelf RUE consistently    Time 12    Period Weeks    Status On-going      OT LONG TERM GOAL #5   Title Pt to demo 25 lbs grip strength or more Rt dominant hand for gripping, opening jars/containers    Time 12    Period Weeks    Status On-going   12.5 lbs     OT LONG TERM GOAL #6   Title Pt to perform simple meal prep and light cleaning tasks mod I level    Time 12    Period Weeks    Status Deferred   deferred  d/t inability to stand/ambulate without assist and AFO     OT LONG TERM GOAL #7   Title Improve functional use RUE as evidenced by performing 25 blocks on Box &  Blocks    Time 12    Period Weeks    Status Revised   12 blocks     OT LONG TERM GOAL #8   Title Pt to returned to modified art/sculpting tasks with task modifications and A/E prn    Time 12    Period Weeks    Status Deferred                 Plan - 06/24/20 1138    Clinical Impression Statement Pt is progressing towards goals. He demonstrates improved overall LUE control.    Occupational performance deficits (Please refer to evaluation for details): ADL's;IADL's;Work;Leisure;Social Participation    Body Structure / Function / Physical Skills ADL;ROM;Dexterity;Balance;IADL;Body mechanics;Improper spinal/pelvic alignment;Sensation;Mobility;Flexibility;Strength;Coordination;FMC;Tone;UE functional use;Decreased knowledge of use of DME;Proprioception    Rehab Potential Good    Comorbidities Affecting Occupational Performance: May have comorbidities impacting occupational performance    OT Frequency 2x / week    OT Duration 8 weeks   additional 8 weeks beginning week of 06/20/20   OT Treatment/Interventions Self-care/ADL training;Therapeutic exercise;Functional Mobility Training;Neuromuscular education;Manual Therapy;Splinting;Aquatic Therapy;Manual lymph drainage;Therapeutic activities;Coping strategies training;DME and/or AE instruction;Electrical Stimulation;Fluidtherapy;Passive range of motion;Patient/family education    Plan continue NMR and functional use RUE    Consulted and Agree with Plan of Care Patient    Family Member Consulted brother           Patient will benefit from skilled therapeutic intervention in order to improve the following deficits and impairments:   Body Structure / Function / Physical Skills: ADL, ROM, Dexterity, Balance, IADL, Body mechanics, Improper spinal/pelvic alignment, Sensation, Mobility, Flexibility, Strength, Coordination, FMC, Tone, UE functional use, Decreased knowledge of use of DME, Proprioception       Visit Diagnosis: Hemiplegia and  hemiparesis following cerebral infarction affecting right dominant side (HCC)  Other lack of coordination  Other symptoms and signs involving the nervous system  Muscle weakness (generalized)  Other symptoms and signs involving cognitive functions following cerebral infarction    Problem List Patient Active Problem List   Diagnosis Date Noted  . Benign essential HTN 06/09/2020  . Abnormality of gait 04/28/2020  . Neurogenic bladder 03/17/2020  . Sepsis due to pneumonia ([place]) 03/06/2020  . History of stroke with residual deficit 03/06/2020  . Transient hypotension 03/06/2020  . Spastic hemiplegia affecting nondominant side ([place])   . History of hypertension   . Expressive aphasia   . AKI (acute kidney injury) ([place])   . Global aphasia   . Hypoalbuminemia due to protein-calorie malnutrition ([place])   . Dysphagia, post-stroke   . Hyperlipidemia 02/02/2020  . Dysphagia following cerebral infarction 02/02/2020  . Aspiration pneumonia ([place]) 02/02/2020  . Acute ischemic left MCA stroke ([place]) 02/02/2020  . Acute respiratory failure ([place])   . Acute ischemic stroke [facility]) s/p clot retrieval L MCA & ACA A3 01/27/2020  . Aspiration into airway 01/27/2020  . Chronic anticoagulation 01/27/2020  . Paroxysmal atrial fibrillation ([place]) 06/15/2018  . Essential hypertension 06/15/2018  . NICM (nonischemic cardiomyopathy) ([place]) 06/15/2018  . Visit for monitoring Tikosyn therapy 06/12/2018    Belem Hintze 06/24/2020, 11:39 AM  [place] [address] [place], [place], [address] Phone: [phone]   Fax:  [phone]  Name: Dustin Schneider MRN: [id number] Date of Birth: 10-Oct-1955

## 2020-06-24 NOTE — Therapy (Signed)
[place] [address] [place], [place], [address] Phone: [phone]   Fax:  [phone]  Physical Therapy Treatment/Progress note  Patient Details  Name: Dustin Schneider MRN: [id number] Date of Birth: [birth year] Referring Provider (PT): Lauraine Rinne, PA-C (followed by Dr. Delice Lesch)    Progress Note  Reporting period 05/27/20 to 06/24/20  See Note below for Objective Data and Assessment of Progress/Goals   Encounter Date: 06/24/2020   PT End of Session - 06/24/20 1023    Visit Number 30    Number of Visits 48    Date for PT Re-Evaluation 09/01/20    Authorization Type BCBS    PT Start Time 0931    PT Stop Time 1015    PT Time Calculation (min) 44 min    Equipment Utilized During Treatment Gait belt    Activity Tolerance Patient tolerated treatment well    Behavior During Therapy [facility] for tasks assessed/performed           Past Medical History:  Diagnosis Date  . Hypertension   . NICM (nonischemic cardiomyopathy) ([place]) 06/15/2018   04/23/18: EF 45%, 09/15/18: NOrmal LVEF  . Paroxysmal atrial fibrillation ([place]) 06/15/2018  . Stroke ([place])   . Visit for monitoring Tikosyn therapy 06/12/2018    Past Surgical History:  Procedure Laterality Date  . BUBBLE STUDY  03/11/2020   Procedure: BUBBLE STUDY;  Surgeon: [name], MD;  Location: [place];  Service: Cardiovascular;;  . CARDIOVERSION N/A 05/13/2018   Procedure: CARDIOVERSION;  Surgeon: [name], MD;  Location: [facility] ENDOSCOPY;  Service: Cardiovascular;  Laterality: N/A;  . CARDIOVERSION N/A 06/13/2018   Procedure: CARDIOVERSION;  Surgeon: [name], MD;  Location: [facility] ENDOSCOPY;  Service: Cardiovascular;  Laterality: N/A;  . CARDIOVERSION N/A 06/23/2018   Procedure: CARDIOVERSION;  Surgeon: [name], MD;  Location: [facility] ENDOSCOPY;  Service: Cardiovascular;  Laterality: N/A;  . CARDIOVERSION N/A 03/11/2020   Procedure: CARDIOVERSION;  Surgeon:  [name], MD;  Location: [place];  Service: Cardiovascular;  Laterality: N/A;  . excision of actinic keratosis    . EYE SURGERY     eye lift  . IR CT HEAD LTD  01/27/2020  . IR PERCUTANEOUS ART THROMBECTOMY/INFUSION INTRACRANIAL INC DIAG ANGIO  01/27/2020      . IR PERCUTANEOUS ART THROMBECTOMY/INFUSION INTRACRANIAL INC DIAG ANGIO  01/27/2020  . RADIOLOGY WITH ANESTHESIA N/A 01/27/2020   Procedure: IR WITH ANESTHESIA;  Surgeon: Radiologist, Medication, MD;  Location: [place];  Service: Radiology;  Laterality: N/A;  . TEE WITHOUT CARDIOVERSION N/A 03/11/2020   Procedure: TRANSESOPHAGEAL ECHOCARDIOGRAM (TEE);  Surgeon: [name], MD;  Location: [place];  Service: Cardiovascular;  Laterality: N/A;  . TONSILLECTOMY    . WISDOM TOOTH EXTRACTION      There were no vitals filed for this visit.   Subjective Assessment - 06/24/20 0933    Subjective Pt reports issues with insurance prevent pt from obtaining brace. Pt is waiting on phone call from [place] upon insurance approval. Pt received Botox injection in soleus and medial ankle as well as the thumb and wrist on Tuesday.    Patient is accompained by: Family member   sister   Pertinent History PMH: HTN, paroxysmal a fib    Limitations Standing;Walking    Patient Stated Goals "wants everything working on R side again"    Currently in Pain? No/denies  [place] Adult PT Treatment/Exercise - 06/24/20 0947      Transfers   Transfers Sit to Stand;Stand to Sit    Sit to Stand 5: Supervision    Stand to Sit 5: Supervision      Ambulation/Gait   Ambulation/Gait Yes    Ambulation/Gait Assistance 4: Min guard;4: Min assist    Ambulation/Gait Assistance Details Cues for taking larger step with LLE. Cues for keeping RLE as straight as possible during R swing phase- PT provided tacticle cues to help with alignment and proper foot placement. Pt cued on proper posture as well.    Ambulation Distance  (Feet) 460 Feet    Assistive device Rolling walker;Other (Comment)   R hand orthotic, R AFO   Gait Pattern Step-through pattern;Decreased hip/knee flexion - right;Poor foot clearance - right   R eversion   Ambulation Surface Level;Indoor      Neuro Re-ed    Neuro Re-ed Details  In // bars: stepping up onto 4" step with LLE and stepping back down with RLE x 12 reps with cues for quad and glute activation on R for increased stability. Pt with difficulty picking up RLE to get it off the step. Pt also with difficulty and min A for proper foot placement when bring RLE down. Pt also instructed forward stepping off step onto RLE and back up with LLE to increase WBing on RLE x 10 reps with min A for PT blocking right hip to prevent compensation. Pt then instructed in repeated STS with 2" step under LLE and manual facilitation of WBing on RLE with PT blocking the R knee for stability and cueing pt to contractr quad and R glutes for increased stability. Pt then instructed in forward and retro walking in // bars with CGA-min A and bil UE support on bars x 4 trials. Pt with improved hip extension and R weight bearing after STS activity.                     PT Short Term Goals - 06/03/20 1050      PT SHORT TERM GOAL #1   Title Pt will be mod I with all transfers with use of right AFO once he receives it for improved mobility.    Baseline 05/17/20 supervision/CGA    Time 4    Period Weeks    Status Revised    Target Date 07/03/20      PT SHORT TERM GOAL #2   Title Pt will be able to ambulate > 345' with RW with right AFO and hand grip attachment min assist for improved mobility.    Baseline 230' min assist with RW and right AFO on 06/03/20    Time 4    Period Weeks    Status New    Target Date 07/03/20      PT SHORT TERM GOAL #3   Title Pt will decrease 5 x sit to stand from 17.98 sec to <15 sec for improved balance and functional strength.    Baseline 06/03/20 17.98 sec from mat with hand      Time 4    Period Weeks    Status New    Target Date 07/03/20             PT Long Term Goals - 06/03/20 1057      PT LONG TERM GOAL #1   Title Pt and pt's family members will be independent with final HEP in order to build upon functional gains  made in therapy. ALL LTGS DUE 09/01/20    Baseline Pt has been performing current HEP with family but PT continues to add to it 06/03/20    Time 12    Period Weeks    Status On-going    Target Date 09/01/20      PT LONG TERM GOAL #2   Title Pt will ambulate >500' on varied surfaces supervision with RW and R AFO for improved household mobility and short community distances.    Baseline 230' with RW with right hand grip attachment and R AFO min assist on 06/03/20    Time 12    Period Weeks    Status Revised    Target Date 09/01/20      PT LONG TERM GOAL #3   Title Pt will decrease TUG from 27 sec to <20 sec for improved balance and decreased fall risk.    Baseline 06/03/20 TUG=27 sec with RW    Time 12    Period Weeks    Status New    Target Date 09/01/20      PT LONG TERM GOAL #4   Title Pt will be able to ambulate up/down curb with RW and R AFO CGA for improved community access.    Time 12    Period Weeks    Status New    Target Date 09/01/20                 Plan - 06/24/20 1113    Clinical Impression Statement Pt with continued improvements in gait quality, specifically linear progression of RLE during R swing phase and taking larger steps with  LLE and maintaining upright posture. Pt is still challenged with R foot clearance as well as hip extension and controlling RLE with step up/downs. Pt with improved hip extension and R foot clearance after STS training to isolate glutes. Will continue to progress towards LTGs.    Personal Factors and Comorbidities Comorbidity 3+    Comorbidities dense L MCA CVA, paroxysmal A. fib on chronic Xarelto, nonischemic cardiomyopathy and hypertension.    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, works as an Electrical engineer Evolving/Moderate complexity    Rehab Potential Good    PT Frequency 2x / week    PT Duration 12 weeks    PT Treatment/Interventions ADLs/Self Care Home Management;Aquatic Therapy;DME Instruction;Gait training;Stair training;Functional mobility training;Therapeutic activities;Therapeutic exercise;Electrical Stimulation;Balance training;Neuromuscular re-education;Wheelchair mobility training;Orthotic Fit/Training;Patient/family education;Passive range of motion;Energy conservation    PT Next Visit Plan Continue with RLE strengthening (target hip extension), weight shifting/NMR to RLE. continue gait training with RW, AFO with t-strap and hand orthotic.    Consulted and Agree with Plan of Care Patient;Family member/caregiver    Family Member Consulted brother-in-law, [name]           Patient will benefit from skilled therapeutic intervention in order to improve the following deficits and impairments:  Abnormal gait, Decreased activity tolerance, Decreased balance, Decreased cognition, Decreased mobility, Decreased coordination, Decreased range of motion, Decreased endurance, Decreased strength, Hypomobility, Difficulty walking, Impaired UE functional use, Impaired vision/preception, Postural dysfunction, Impaired sensation  Visit Diagnosis: Hemiplegia and hemiparesis following cerebral infarction affecting right dominant side (HCC)  Other lack of coordination  Muscle weakness (generalized)     Problem List Patient Active Problem List   Diagnosis Date Noted  . Benign essential HTN 06/09/2020  . Abnormality of gait 04/28/2020  . Neurogenic  bladder 03/17/2020  . Sepsis due to pneumonia ([place]) 03/06/2020  . History of stroke with residual  deficit 03/06/2020  . Transient hypotension 03/06/2020  . Spastic hemiplegia affecting nondominant side ([place])   . History of hypertension   . Expressive aphasia   . AKI (acute kidney injury) ([place])   . Global aphasia   . Hypoalbuminemia due to protein-calorie malnutrition ([place])   . Dysphagia, post-stroke   . Hyperlipidemia 02/02/2020  . Dysphagia following cerebral infarction 02/02/2020  . Aspiration pneumonia ([place]) 02/02/2020  . Acute ischemic left MCA stroke ([place]) 02/02/2020  . Acute respiratory failure ([place])   . Acute ischemic stroke [facility] s/p clot retrieval L MCA & ACA A3 01/27/2020  . Aspiration into airway 01/27/2020  . Chronic anticoagulation 01/27/2020  . Paroxysmal atrial fibrillation ([place]) 06/15/2018  . Essential hypertension 06/15/2018  . NICM (nonischemic cardiomyopathy) ([place]) 06/15/2018  . Visit for monitoring Tikosyn therapy 06/12/2018    Rosalita Levan, SPT 06/24/2020, 11:18 AM  [place] [address] [place], [place], [address] Phone: [phone]   Fax:  [phone]  Name: ISSAC MOURE MRN: [id number] Date of Birth: 08-31-1955

## 2020-06-27 ENCOUNTER — Ambulatory Visit: Payer: BC Managed Care – PPO | Admitting: Physical Therapy

## 2020-06-27 ENCOUNTER — Ambulatory Visit: Payer: BC Managed Care – PPO | Admitting: Occupational Therapy

## 2020-06-27 ENCOUNTER — Encounter: Payer: Self-pay | Admitting: Physical Therapy

## 2020-06-27 ENCOUNTER — Ambulatory Visit: Payer: BC Managed Care – PPO

## 2020-06-27 ENCOUNTER — Other Ambulatory Visit: Payer: Self-pay

## 2020-06-27 DIAGNOSIS — R278 Other lack of coordination: Secondary | ICD-10-CM

## 2020-06-27 DIAGNOSIS — M6281 Muscle weakness (generalized): Secondary | ICD-10-CM

## 2020-06-27 DIAGNOSIS — I69351 Hemiplegia and hemiparesis following cerebral infarction affecting right dominant side: Secondary | ICD-10-CM

## 2020-06-27 DIAGNOSIS — R29818 Other symptoms and signs involving the nervous system: Secondary | ICD-10-CM

## 2020-06-27 NOTE — Therapy (Addendum)
[facility] [address] [place], [place], [address] Phone: [phone]   Fax:  [phone]  Physical Therapy Treatment  Patient Details  Name: Dustin Schneider MRN: [id number] Date of Birth: 28-Jan-1956 Referring Provider (PT): Lauraine Rinne, PA-C (followed by Dr. Delice Lesch)   Encounter Date: 06/27/2020   PT End of Session - 06/27/20 2152    Visit Number 31    Number of Visits 48    Date for PT Re-Evaluation 09/01/20    Authorization Type BCBS    PT Start Time 0848    PT Stop Time 0930    PT Time Calculation (min) 42 min    Equipment Utilized During Treatment Gait belt    Activity Tolerance Patient tolerated treatment well    Behavior During Therapy [facility] for tasks assessed/performed           Past Medical History:  Diagnosis Date  . Hypertension   . NICM (nonischemic cardiomyopathy) ([place]) 06/15/2018   04/23/18: EF 45%, 09/15/18: NOrmal LVEF  . Paroxysmal atrial fibrillation ([place]) 06/15/2018  . Stroke ([place])   . Visit for monitoring Tikosyn therapy 06/12/2018    Past Surgical History:  Procedure Laterality Date  . BUBBLE STUDY  03/11/2020   Procedure: BUBBLE STUDY;  Surgeon: [name], MD;  Location: [facility];  Service: Cardiovascular;;  . CARDIOVERSION N/A 05/13/2018   Procedure: CARDIOVERSION;  Surgeon: [name], MD;  Location: [facility] ENDOSCOPY;  Service: Cardiovascular;  Laterality: N/A;  . CARDIOVERSION N/A 06/13/2018   Procedure: CARDIOVERSION;  Surgeon: [name], MD;  Location: [facility] ENDOSCOPY;  Service: Cardiovascular;  Laterality: N/A;  . CARDIOVERSION N/A 06/23/2018   Procedure: CARDIOVERSION;  Surgeon: [name], MD;  Location: [facility] ENDOSCOPY;  Service: Cardiovascular;  Laterality: N/A;  . CARDIOVERSION N/A 03/11/2020   Procedure: CARDIOVERSION;  Surgeon: [name], MD;  Location: [facility];  Service: Cardiovascular;  Laterality: N/A;  . excision of actinic keratosis    . EYE SURGERY      eye lift  . IR CT HEAD LTD  01/27/2020  . IR PERCUTANEOUS ART THROMBECTOMY/INFUSION INTRACRANIAL INC DIAG ANGIO  01/27/2020      . IR PERCUTANEOUS ART THROMBECTOMY/INFUSION INTRACRANIAL INC DIAG ANGIO  01/27/2020  . RADIOLOGY WITH ANESTHESIA N/A 01/27/2020   Procedure: IR WITH ANESTHESIA;  Surgeon: Radiologist, Medication, MD;  Location: [facility];  Service: Radiology;  Laterality: N/A;  . TEE WITHOUT CARDIOVERSION N/A 03/11/2020   Procedure: TRANSESOPHAGEAL ECHOCARDIOGRAM (TEE);  Surgeon: [name], MD;  Location: [facility];  Service: Cardiovascular;  Laterality: N/A;  . TONSILLECTOMY    . WISDOM TOOTH EXTRACTION      There were no vitals filed for this visit.   Subjective Assessment - 06/27/20 0853    Subjective No falls, no changes since he was last here.    Patient is accompained by: Family member   sister   Pertinent History PMH: HTN, paroxysmal a fib    Limitations Standing;Walking    Patient Stated Goals "wants everything working on R side again"    Currently in Pain? No/denies                             [facility] Adult PT Treatment/Exercise - 06/28/20 0001      Transfers   Transfers Sit to Stand;Stand to Sit    Sit to Stand 5: Supervision    Stand to Sit 5: Supervision    Squat Pivot Transfers 5: Supervision  Squat Pivot Transfer Details (indicate cue type and reason) from w/c > mat table, cues to put weight through RLE      Ambulation/Gait   Ambulation/Gait Yes    Ambulation/Gait Assistance 4: Min guard;4: Min assist    Ambulation/Gait Assistance Details plus additional distances to and from // bars (2 x 70'), cues for posture and to slow down at times to focus on RLE foot placement, needing min A at times esp when more fatigued for incr R hip/knee flexion    Ambulation Distance (Feet) 345 Feet    Assistive device Rolling walker;Other (Comment)   R hand orthosis, R AFO   Gait Pattern Step-through pattern;Decreased hip/knee flexion - right;Poor foot  clearance - right    Ambulation Surface Level;Indoor      Neuro Re-ed    Neuro Re-ed Details  in // bars: side stepping down and back with BUE support x3 reps with mod A for RLE foot clearance when stepping to L, min A for foot clearance with pt stepping to R. pt's gf asking about if pt can perform anything like this at home at the counter. discussed side stepping at home is not safe, got pt in w/c and wheeled over to sink to perform standing tall on RLE (with cues for R quad/glute activation) and stepping out and in with LLE for closed chain hip ABD strengthening on RLE/weight bearing, pt and pt's gf [name] verbalized understanding and took a video of pt performing, educated to make sure pt was wearing air caste at home for incr stability.      Exercises   Exercises Other Exercises      Knee/Hip Exercises: Standing   Forward Step Up Right;Left;Hand Hold: 2;Step Height: 4"    Forward Step Up Limitations 2 x 10 reps placing RLE on step and performing stepping up/down with LLE, cues for glute/quad activation and tall posture when completing step up, then performing x10 reps with stepping LLE up/down and RLE up/down - cues for incr foot clearance with RLE and for proper foot placement with stepping foot off step (as pt tending to put it too narrow), pt needing min guard, no assist today for RLE foot placement.                  PT Education - 06/27/20 2151    Education Details added standing at countertop (with w.c posteriorly) and stepping LLE out laterally and back in to midline for weight shifting/glute activation with RLE, pt's gf edie verbalized understanding and videotaped pt performing.    Person(s) Educated Patient   pt's gf   Methods Explanation;Demonstration    Comprehension Verbalized understanding;Returned demonstration            PT Short Term Goals - 06/03/20 1050      PT SHORT TERM GOAL #1   Title Pt will be mod I with all transfers with use of right AFO once he receives  it for improved mobility.    Baseline 05/17/20 supervision/CGA    Time 4    Period Weeks    Status Revised    Target Date 07/03/20      PT SHORT TERM GOAL #2   Title Pt will be able to ambulate > 345' with RW with right AFO and hand grip attachment min assist for improved mobility.    Baseline 230' min assist with RW and right AFO on 06/03/20    Time 4    Period Weeks    Status New  Target Date 07/03/20      PT SHORT TERM GOAL #3   Title Pt will decrease 5 x sit to stand from 17.98 sec to <15 sec for improved balance and functional strength.    Baseline 06/03/20 17.98 sec from mat with hand    Time 4    Period Weeks    Status New    Target Date 07/03/20             PT Long Term Goals - 06/03/20 1057      PT LONG TERM GOAL #1   Title Pt and pt's family members will be independent with final HEP in order to build upon functional gains made in therapy. ALL LTGS DUE 09/01/20    Baseline Pt has been performing current HEP with family but PT continues to add to it 06/03/20    Time 12    Period Weeks    Status On-going    Target Date 09/01/20      PT LONG TERM GOAL #2   Title Pt will ambulate >500' on varied surfaces supervision with RW and R AFO for improved household mobility and short community distances.    Baseline 230' with RW with right hand grip attachment and R AFO min assist on 06/03/20    Time 12    Period Weeks    Status Revised    Target Date 09/01/20      PT LONG TERM GOAL #3   Title Pt will decrease TUG from 27 sec to <20 sec for improved balance and decreased fall risk.    Baseline 06/03/20 TUG=27 sec with RW    Time 12    Period Weeks    Status New    Target Date 09/01/20      PT LONG TERM GOAL #4   Title Pt will be able to ambulate up/down curb with RW and R AFO CGA for improved community access.    Time 12    Period Weeks    Status New    Target Date 09/01/20                 Plan - 06/28/20 0907    Clinical Impression Statement Focus  of today's skilled session was gait training, with pt needing min A at times for incr hip/knee flexion on RLE, esp when more fatigued. Attempted side stepping in // bars today with BUE support, pt needing mod A with RLE foot clearance when stepping to L. Will continue to progress towards LTGs.    Personal Factors and Comorbidities Comorbidity 3+    Comorbidities dense L MCA CVA, paroxysmal A. fib on chronic Xarelto, nonischemic cardiomyopathy and hypertension.    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, works as an Electrical engineer Evolving/Moderate complexity    Rehab Potential Good    PT Frequency 2x / week    PT Duration 12 weeks    PT Treatment/Interventions ADLs/Self Care Home Management;Aquatic Therapy;DME Instruction;Gait training;Stair training;Functional mobility training;Therapeutic activities;Therapeutic exercise;Electrical Stimulation;Balance training;Neuromuscular re-education;Wheelchair mobility training;Orthotic Fit/Training;Patient/family education;Passive range of motion;Energy conservation    PT Next Visit Plan Continue with RLE strengthening (target hip extension), weight shifting/NMR to RLE. continue gait training with RW, try balance on compliant surfaces. AFO with t-strap and hand orthotic.    PT Home Exercise Plan on 06/28/20 - added standing at countetop and stepping LLE out and in    Consulted and  Agree with Plan of Care Patient;Family member/caregiver    Family Member Consulted brother-in-law, [name]           Patient will benefit from skilled therapeutic intervention in order to improve the following deficits and impairments:  Abnormal gait, Decreased activity tolerance, Decreased balance, Decreased cognition, Decreased mobility, Decreased coordination,  Decreased range of motion, Decreased endurance, Decreased strength, Hypomobility, Difficulty walking, Impaired UE functional use, Impaired vision/preception, Postural dysfunction, Impaired sensation  Visit Diagnosis: Other lack of coordination  Muscle weakness (generalized)  Other symptoms and signs involving the nervous system  Hemiplegia and hemiparesis following cerebral infarction affecting right dominant side [facility])     Problem List Patient Active Problem List   Diagnosis Date Noted  . Benign essential HTN 06/09/2020  . Abnormality of gait 04/28/2020  . Neurogenic bladder 03/17/2020  . Sepsis due to pneumonia ([place]) 03/06/2020  . History of stroke with residual deficit 03/06/2020  . Transient hypotension 03/06/2020  . Spastic hemiplegia affecting nondominant side ([place])   . History of hypertension   . Expressive aphasia   . AKI (acute kidney injury) ([place])   . Global aphasia   . Hypoalbuminemia due to protein-calorie malnutrition ([place])   . Dysphagia, post-stroke   . Hyperlipidemia 02/02/2020  . Dysphagia following cerebral infarction 02/02/2020  . Aspiration pneumonia ([place]) 02/02/2020  . Acute ischemic left MCA stroke ([place]) 02/02/2020  . Acute respiratory failure ([place])   . Acute ischemic stroke [facility]) s/p clot retrieval L MCA & ACA A3 01/27/2020  . Aspiration into airway 01/27/2020  . Chronic anticoagulation 01/27/2020  . Paroxysmal atrial fibrillation ([place]) 06/15/2018  . Essential hypertension 06/15/2018  . NICM (nonischemic cardiomyopathy) ([place]) 06/15/2018  . Visit for monitoring Tikosyn therapy 06/12/2018    Arliss Journey, PT, DPT  06/28/2020, 9:11 AM  [facility] [address] [place], [place], [address] Phone: [phone]   Fax:  [phone]  Name: Dustin Schneider MRN: [id number] Date of Birth: Jan 17, 1956

## 2020-06-27 NOTE — Therapy (Signed)
[facility] [address] [place], [place], [address] Phone: [phone]   Fax:  [phone]  Occupational Therapy Treatment  Patient Details  Name: Dustin Schneider MRN: [id number] Date of Birth: 1956/03/31 No data recorded  Encounter Date: 06/27/2020   OT End of Session - 06/27/20 1007    Visit Number 28    Number of Visits 41    Date for OT Re-Evaluation 08/19/20    Authorization Type BC/BS - No auth required, covered 100%    Authorization Time Period week 1/8 for renewal period    OT Start Time 0930    OT Stop Time 1012    OT Time Calculation (min) 42 min    Activity Tolerance Patient tolerated treatment well    Behavior During Therapy [facility] for tasks assessed/performed           Past Medical History:  Diagnosis Date  . Hypertension   . NICM (nonischemic cardiomyopathy) ([place]) 06/15/2018   04/23/18: EF 45%, 09/15/18: NOrmal LVEF  . Paroxysmal atrial fibrillation ([place]) 06/15/2018  . Stroke ([place])   . Visit for monitoring Tikosyn therapy 06/12/2018    Past Surgical History:  Procedure Laterality Date  . BUBBLE STUDY  03/11/2020   Procedure: BUBBLE STUDY;  Surgeon: [name], MD;  Location: [facility];  Service: Cardiovascular;;  . CARDIOVERSION N/A 05/13/2018   Procedure: CARDIOVERSION;  Surgeon: [name], MD;  Location: [facility] ENDOSCOPY;  Service: Cardiovascular;  Laterality: N/A;  . CARDIOVERSION N/A 06/13/2018   Procedure: CARDIOVERSION;  Surgeon: [name], MD;  Location: [facility] ENDOSCOPY;  Service: Cardiovascular;  Laterality: N/A;  . CARDIOVERSION N/A 06/23/2018   Procedure: CARDIOVERSION;  Surgeon: [name], MD;  Location: [facility] ENDOSCOPY;  Service: Cardiovascular;  Laterality: N/A;  . CARDIOVERSION N/A 03/11/2020   Procedure: CARDIOVERSION;  Surgeon: [name], MD;  Location: [facility];  Service: Cardiovascular;  Laterality: N/A;  . excision of actinic keratosis    . EYE SURGERY     eye lift  . IR CT  HEAD LTD  01/27/2020  . IR PERCUTANEOUS ART THROMBECTOMY/INFUSION INTRACRANIAL INC DIAG ANGIO  01/27/2020      . IR PERCUTANEOUS ART THROMBECTOMY/INFUSION INTRACRANIAL INC DIAG ANGIO  01/27/2020  . RADIOLOGY WITH ANESTHESIA N/A 01/27/2020   Procedure: IR WITH ANESTHESIA;  Surgeon: Radiologist, Medication, MD;  Location: [facility];  Service: Radiology;  Laterality: N/A;  . TEE WITHOUT CARDIOVERSION N/A 03/11/2020   Procedure: TRANSESOPHAGEAL ECHOCARDIOGRAM (TEE);  Surgeon: [name], MD;  Location: [facility];  Service: Cardiovascular;  Laterality: N/A;  . TONSILLECTOMY    . WISDOM TOOTH EXTRACTION      There were no vitals filed for this visit.   Subjective Assessment - 06/27/20 0931    Subjective  I got botox    Patient is accompanied by: Family member    Pertinent History Lt MCA CVA 01/27/20, readmitted to hospital with HAP and fever on 03/06/20 and released 03/11/20. PMH: paroxsymal A-fib, HTN    Limitations fall    Currently in Pain? No/denies           Supine: chest press with PVC frame with min facilitation Rt elbow and wrist, followed by unilateral elbow ext RUE. Also worked on Insurance underwriter and elbow flex/ext in supine holding cone for visual input to maintain neutral forearm rotation and wrist RD Seated: worked on isolated wrist RD, forearm supination with forearm supported followed by simulated movements for eating and drinking. Progressed to reaching for and bringing cone  to mouth and replacing on lower surface to facilitate RD. UBE x 8 min. Level 3 with Rt hand wrapped                       OT Short Term Goals - 06/13/20 1245      OT SHORT TERM GOAL #1   Title Independent with initial HEP    Time 4    Period Weeks    Status Achieved      OT SHORT TERM GOAL #2   Title Pt to perform low to mid level reaching of light objects RUE in prep for feeding self and bathing    Time 4    Period Weeks    Status Partially Met   low level, inconsistent mid level       OT SHORT TERM GOAL #3   Title Pt to feed self 50% of the time with Rt dominant hand and A/E prn    Time 4    Period Weeks    Status On-going   06/13/20: Pt reports eating yogurt/breakfast and some finger foods, but using Lt hand for lunch/dinner)     OT SHORT TERM GOAL #4   Title Pt to perform UE dressing of pullover shirt mod I level    Time 4    Period Weeks    Status Achieved   met per pt and brother     OT SHORT TERM GOAL #5   Title Pt to perform LE dressing and all bathing with no more than mod assist using A/E and strategies prn    Time 4    Period Weeks    Status Achieved   04/20/20:  set-up for bedside bath, mod A for LE dressing     OT SHORT TERM GOAL #6   Title Pt to perform toileting and toilet transfers with no more than min assist overall    Time 4    Period Weeks    Status Achieved   supervision     OT SHORT TERM GOAL #7   Title Improve functional use of RUE as evidenced by performing 8 blocks on Box & Blocks test    Baseline 0    Time 4    Period Weeks    Status Achieved   04/20/20:  6 blocks, 05/05/20: 12 blocks            OT Long Term Goals - 06/13/20 1248      OT LONG TERM GOAL #1   Title Pt to be independent with updated HEP    Time 12    Period Weeks    Status On-going      OT LONG TERM GOAL #2   Title Pt to perform all BADLS at mod I level    Time 12    Period Weeks    Status On-going   min assist for LE dressing     OT LONG TERM GOAL #3   Title Pt to feed self 50% or greater with Rt dominant hand and grooming 25% or more with Rt hand    Time 12    Period Weeks    Status Revised      OT LONG TERM GOAL #4   Title Pt to retrieve light weight objects from high shelf RUE consistently    Time 12    Period Weeks    Status On-going      OT LONG TERM GOAL #5   Title Pt to demo  25 lbs grip strength or more Rt dominant hand for gripping, opening jars/containers    Time 12    Period Weeks    Status On-going   12.5 lbs     OT LONG TERM GOAL  #6   Title Pt to perform simple meal prep and light cleaning tasks mod I level    Time 12    Period Weeks    Status Deferred   deferred d/t inability to stand/ambulate without assist and AFO     OT LONG TERM GOAL #7   Title Improve functional use RUE as evidenced by performing 25 blocks on Box & Blocks    Time 12    Period Weeks    Status Revised   12 blocks     OT LONG TERM GOAL #8   Title Pt to returned to modified art/sculpting tasks with task modifications and A/E prn    Time 12    Period Weeks    Status Deferred                 Plan - 06/27/20 1008    Clinical Impression Statement Pt with increased RD at Rt wrist with mod distal support and w/ open chain functional low level releasing    Occupational performance deficits (Please refer to evaluation for details): ADL's;IADL's;Work;Leisure;Social Participation    Body Structure / Function / Physical Skills ADL;ROM;Dexterity;Balance;IADL;Body mechanics;Improper spinal/pelvic alignment;Sensation;Mobility;Flexibility;Strength;Coordination;FMC;Tone;UE functional use;Decreased knowledge of use of DME;Proprioception    Rehab Potential Good    Comorbidities Affecting Occupational Performance: May have comorbidities impacting occupational performance    OT Frequency 2x / week    OT Duration 8 weeks   additional 8 weeks beginning week of 06/20/20   OT Treatment/Interventions Self-care/ADL training;Therapeutic exercise;Functional Mobility Training;Neuromuscular education;Manual Therapy;Splinting;Aquatic Therapy;Manual lymph drainage;Therapeutic activities;Coping strategies training;DME and/or AE instruction;Electrical Stimulation;Fluidtherapy;Passive range of motion;Patient/family education    Plan continue NMR and functional use RUE    Consulted and Agree with Plan of Care Patient    Family Member Consulted brother           Patient will benefit from skilled therapeutic intervention in order to improve the following deficits and  impairments:   Body Structure / Function / Physical Skills: ADL, ROM, Dexterity, Balance, IADL, Body mechanics, Improper spinal/pelvic alignment, Sensation, Mobility, Flexibility, Strength, Coordination, FMC, Tone, UE functional use, Decreased knowledge of use of DME, Proprioception       Visit Diagnosis: Other lack of coordination  Other symptoms and signs involving the nervous system  Muscle weakness (generalized)    Problem List Patient Active Problem List   Diagnosis Date Noted  . Benign essential HTN 06/09/2020  . Abnormality of gait 04/28/2020  . Neurogenic bladder 03/17/2020  . Sepsis due to pneumonia ([place]) 03/06/2020  . History of stroke with residual deficit 03/06/2020  . Transient hypotension 03/06/2020  . Spastic hemiplegia affecting nondominant side ([place])   . History of hypertension   . Expressive aphasia   . AKI (acute kidney injury) ([place])   . Global aphasia   . Hypoalbuminemia due to protein-calorie malnutrition ([place])   . Dysphagia, post-stroke   . Hyperlipidemia 02/02/2020  . Dysphagia following cerebral infarction 02/02/2020  . Aspiration pneumonia ([place]) 02/02/2020  . Acute ischemic left MCA stroke ([place]) 02/02/2020  . Acute respiratory failure ([place])   . Acute ischemic stroke [facility]) s/p clot retrieval L MCA & ACA A3 01/27/2020  . Aspiration into airway 01/27/2020  . Chronic anticoagulation 01/27/2020  . Paroxysmal atrial fibrillation ([place]) 06/15/2018  .  Essential hypertension 06/15/2018  . NICM (nonischemic cardiomyopathy) ([place]) 06/15/2018  . Visit for monitoring Tikosyn therapy 06/12/2018    Carey Bullocks, OTR/L 06/27/2020, 10:10 AM  [facility] [address] [place], [place], [address] Phone: [phone]   Fax:  [phone]  Name: SONYA PUCCI MRN: [id number] Date of Birth: 05/10/1956

## 2020-06-28 NOTE — Patient Instructions (Signed)
06/28/20:  Verbally added to HEP and had pt's gf [name] videotape pt performing: Pt standing at counter with air caste on (for home) and w/c posteriorly: standing and stepping LLE out and in (keeping RLE stable - cues for glute activation) 2 x 10 reps.

## 2020-07-01 ENCOUNTER — Other Ambulatory Visit: Payer: Self-pay

## 2020-07-01 ENCOUNTER — Ambulatory Visit: Payer: BC Managed Care – PPO

## 2020-07-01 ENCOUNTER — Ambulatory Visit: Payer: BC Managed Care – PPO | Admitting: Occupational Therapy

## 2020-07-01 ENCOUNTER — Encounter: Payer: Self-pay | Admitting: Occupational Therapy

## 2020-07-01 DIAGNOSIS — R29818 Other symptoms and signs involving the nervous system: Secondary | ICD-10-CM

## 2020-07-01 DIAGNOSIS — R2689 Other abnormalities of gait and mobility: Secondary | ICD-10-CM

## 2020-07-01 DIAGNOSIS — I69351 Hemiplegia and hemiparesis following cerebral infarction affecting right dominant side: Secondary | ICD-10-CM

## 2020-07-01 DIAGNOSIS — M6281 Muscle weakness (generalized): Secondary | ICD-10-CM

## 2020-07-01 DIAGNOSIS — R278 Other lack of coordination: Secondary | ICD-10-CM

## 2020-07-01 DIAGNOSIS — R2681 Unsteadiness on feet: Secondary | ICD-10-CM

## 2020-07-01 DIAGNOSIS — I69318 Other symptoms and signs involving cognitive functions following cerebral infarction: Secondary | ICD-10-CM

## 2020-07-01 NOTE — Therapy (Signed)
[facility] [address] [place], [place], [address] Phone: [phone]   Fax:  [phone]  Occupational Therapy Treatment  Patient Details  Name: Dustin Schneider MRN: [id number] Date of Birth: June 23, 1956 No data recorded  Encounter Date: 07/01/2020   OT End of Session - 07/01/20 1420    Visit Number 29    Number of Visits 41    Date for OT Re-Evaluation 08/19/20    Authorization Type BC/BS - No auth required, covered 100%    Authorization Time Period week 1/8 for renewal period    OT Start Time 1021    OT Stop Time 1100    OT Time Calculation (min) 39 min    Activity Tolerance Patient tolerated treatment well    Behavior During Therapy [facility] for tasks assessed/performed           Past Medical History:  Diagnosis Date  . Hypertension   . NICM (nonischemic cardiomyopathy) ([place]) 06/15/2018   04/23/18: EF 45%, 09/15/18: NOrmal LVEF  . Paroxysmal atrial fibrillation ([place]) 06/15/2018  . Stroke ([place])   . Visit for monitoring Tikosyn therapy 06/12/2018    Past Surgical History:  Procedure Laterality Date  . BUBBLE STUDY  03/11/2020   Procedure: BUBBLE STUDY;  Surgeon: [name], MD;  Location: [place];  Service: Cardiovascular;;  . CARDIOVERSION N/A 05/13/2018   Procedure: CARDIOVERSION;  Surgeon: [name], MD;  Location: [facility] ENDOSCOPY;  Service: Cardiovascular;  Laterality: N/A;  . CARDIOVERSION N/A 06/13/2018   Procedure: CARDIOVERSION;  Surgeon: [name], MD;  Location: [facility] ENDOSCOPY;  Service: Cardiovascular;  Laterality: N/A;  . CARDIOVERSION N/A 06/23/2018   Procedure: CARDIOVERSION;  Surgeon: [name], MD;  Location: [facility] ENDOSCOPY;  Service: Cardiovascular;  Laterality: N/A;  . CARDIOVERSION N/A 03/11/2020   Procedure: CARDIOVERSION;  Surgeon: [name], MD;  Location: [place];  Service: Cardiovascular;  Laterality: N/A;  . excision of actinic keratosis    . EYE SURGERY     eye lift  . IR CT  HEAD LTD  01/27/2020  . IR PERCUTANEOUS ART THROMBECTOMY/INFUSION INTRACRANIAL INC DIAG ANGIO  01/27/2020      . IR PERCUTANEOUS ART THROMBECTOMY/INFUSION INTRACRANIAL INC DIAG ANGIO  01/27/2020  . RADIOLOGY WITH ANESTHESIA N/A 01/27/2020   Procedure: IR WITH ANESTHESIA;  Surgeon: Radiologist, Medication, MD;  Location: [place];  Service: Radiology;  Laterality: N/A;  . TEE WITHOUT CARDIOVERSION N/A 03/11/2020   Procedure: TRANSESOPHAGEAL ECHOCARDIOGRAM (TEE);  Surgeon: [name], MD;  Location: [place];  Service: Cardiovascular;  Laterality: N/A;  . TONSILLECTOMY    . WISDOM TOOTH EXTRACTION      There were no vitals filed for this visit.   Subjective Assessment - 07/01/20 1058    Subjective  deneis pain    Pertinent History Lt MCA CVA 01/27/20, readmitted to hospital with HAP and fever on 03/06/20 and released 03/11/20. PMH: paroxsymal A-fib, HTN    Currently in Pain? No/denies               Treatment: Supine closed chain shoulder flexion and chest press with PVC pipe frame, min facilitation,  then unilateral triceps extension with RUE, min-mod facilitation. Seated weightbearing through RUE over block with body on arm movements min-mod facilitation v.c reaching and trunk rotation with LUE.  Then chain midrange shoulder flexion min v.c/ facilitation for proper positioning Wrist joint mobs for RUE followed by A/ROM wrist flexion/ extension and ulnar/ radial deviation. Arm bike x 6 mins with hand wrapped for  reciprocal motion                    OT Short Term Goals - 06/13/20 1245      OT SHORT TERM GOAL #1   Title Independent with initial HEP    Time 4    Period Weeks    Status Achieved      OT SHORT TERM GOAL #2   Title Pt to perform low to mid level reaching of light objects RUE in prep for feeding self and bathing    Time 4    Period Weeks    Status Partially Met   low level, inconsistent mid level     OT SHORT TERM GOAL #3   Title Pt to feed self 50%  of the time with Rt dominant hand and A/E prn    Time 4    Period Weeks    Status On-going   06/13/20: Pt reports eating yogurt/breakfast and some finger foods, but using Lt hand for lunch/dinner)     OT SHORT TERM GOAL #4   Title Pt to perform UE dressing of pullover shirt mod I level    Time 4    Period Weeks    Status Achieved   met per pt and brother     OT SHORT TERM GOAL #5   Title Pt to perform LE dressing and all bathing with no more than mod assist using A/E and strategies prn    Time 4    Period Weeks    Status Achieved   04/20/20:  set-up for bedside bath, mod A for LE dressing     OT SHORT TERM GOAL #6   Title Pt to perform toileting and toilet transfers with no more than min assist overall    Time 4    Period Weeks    Status Achieved   supervision     OT SHORT TERM GOAL #7   Title Improve functional use of RUE as evidenced by performing 8 blocks on Box & Blocks test    Baseline 0    Time 4    Period Weeks    Status Achieved   04/20/20:  6 blocks, 05/05/20: 12 blocks            OT Long Term Goals - 06/13/20 1248      OT LONG TERM GOAL #1   Title Pt to be independent with updated HEP    Time 12    Period Weeks    Status On-going      OT LONG TERM GOAL #2   Title Pt to perform all BADLS at mod I level    Time 12    Period Weeks    Status On-going   min assist for LE dressing     OT LONG TERM GOAL #3   Title Pt to feed self 50% or greater with Rt dominant hand and grooming 25% or more with Rt hand    Time 12    Period Weeks    Status Revised      OT LONG TERM GOAL #4   Title Pt to retrieve light weight objects from high shelf RUE consistently    Time 12    Period Weeks    Status On-going      OT LONG TERM GOAL #5   Title Pt to demo 25 lbs grip strength or more Rt dominant hand for gripping, opening jars/containers    Time 12    Period  Weeks    Status On-going   12.5 lbs     OT LONG TERM GOAL #6   Title Pt to perform simple meal prep and light  cleaning tasks mod I level    Time 12    Period Weeks    Status Deferred   deferred d/t inability to stand/ambulate without assist and AFO     OT LONG TERM GOAL #7   Title Improve functional use RUE as evidenced by performing 25 blocks on Box & Blocks    Time 12    Period Weeks    Status Revised   12 blocks     OT LONG TERM GOAL #8   Title Pt to returned to modified art/sculpting tasks with task modifications and A/E prn    Time 12    Period Weeks    Status Deferred                 Plan - 07/01/20 1419    Clinical Impression Statement Pt is progressing towards goals with improving RUE control.    Occupational performance deficits (Please refer to evaluation for details): ADL's;IADL's;Work;Leisure;Social Participation    Body Structure / Function / Physical Skills ADL;ROM;Dexterity;Balance;IADL;Body mechanics;Improper spinal/pelvic alignment;Sensation;Mobility;Flexibility;Strength;Coordination;FMC;Tone;UE functional use;Decreased knowledge of use of DME;Proprioception    Rehab Potential Good    Comorbidities Affecting Occupational Performance: May have comorbidities impacting occupational performance    OT Frequency 2x / week    OT Duration 8 weeks   additional 8 weeks beginning week of 06/20/20   OT Treatment/Interventions Self-care/ADL training;Therapeutic exercise;Functional Mobility Training;Neuromuscular education;Manual Therapy;Splinting;Aquatic Therapy;Manual lymph drainage;Therapeutic activities;Coping strategies training;DME and/or AE instruction;Electrical Stimulation;Fluidtherapy;Passive range of motion;Patient/family education    Plan continue NMR and functional use RUE    Consulted and Agree with Plan of Care Patient    Family Member Consulted brother           Patient will benefit from skilled therapeutic intervention in order to improve the following deficits and impairments:   Body Structure / Function / Physical Skills: ADL, ROM, Dexterity, Balance, IADL,  Body mechanics, Improper spinal/pelvic alignment, Sensation, Mobility, Flexibility, Strength, Coordination, FMC, Tone, UE functional use, Decreased knowledge of use of DME, Proprioception       Visit Diagnosis: Other lack of coordination  Muscle weakness (generalized)  Other symptoms and signs involving the nervous system  Hemiplegia and hemiparesis following cerebral infarction affecting right dominant side (HCC)  Other symptoms and signs involving cognitive functions following cerebral infarction    Problem List Patient Active Problem List   Diagnosis Date Noted  . Benign essential HTN 06/09/2020  . Abnormality of gait 04/28/2020  . Neurogenic bladder 03/17/2020  . Sepsis due to pneumonia ([place]) 03/06/2020  . History of stroke with residual deficit 03/06/2020  . Transient hypotension 03/06/2020  . Spastic hemiplegia affecting nondominant side ([place])   . History of hypertension   . Expressive aphasia   . AKI (acute kidney injury) ([place])   . Global aphasia   . Hypoalbuminemia due to protein-calorie malnutrition ([place])   . Dysphagia, post-stroke   . Hyperlipidemia 02/02/2020  . Dysphagia following cerebral infarction 02/02/2020  . Aspiration pneumonia ([place]) 02/02/2020  . Acute ischemic left MCA stroke ([place]) 02/02/2020  . Acute respiratory failure ([place])   . Acute ischemic stroke [facility]) s/p clot retrieval L MCA & ACA A3 01/27/2020  . Aspiration into airway 01/27/2020  . Chronic anticoagulation 01/27/2020  . Paroxysmal atrial fibrillation ([place]) 06/15/2018  . Essential hypertension 06/15/2018  . NICM (nonischemic cardiomyopathy) (  [place]) 06/15/2018  . Visit for monitoring Tikosyn therapy 06/12/2018    [place] Glorioso 07/01/2020, 2:21 PM  [facility] [address] [place], [place], [address] Phone: [phone]   Fax:  [phone]  Name: Dustin Schneider MRN: [id number] Date of Birth: 1955-09-30

## 2020-07-01 NOTE — Therapy (Addendum)
[facility] [address] [place], [place], [address] Phone: [phone]   Fax:  [phone]  Physical Therapy Treatment  Patient Details  Name: Dustin Schneider MRN: [id number] Date of Birth: October 27, 1955 Referring Provider (PT): Lauraine Rinne, PA-C (followed by Dr. Delice Lesch)   Encounter Date: 07/01/2020   PT End of Session - 07/01/20 1516    Visit Number 32    Number of Visits 48    Date for PT Re-Evaluation 09/01/20    Authorization Type BCBS    PT Start Time 0930    PT Stop Time 0511    PT Time Calculation (min) 45 min    Equipment Utilized During Treatment Gait belt    Activity Tolerance Patient tolerated treatment well    Behavior During Therapy [facility] for tasks assessed/performed           Past Medical History:  Diagnosis Date   Hypertension    NICM (nonischemic cardiomyopathy) ([place]) 06/15/2018   04/23/18: EF 45%, 09/15/18: NOrmal LVEF   Paroxysmal atrial fibrillation ([place]) 06/15/2018   Stroke ([place])    Visit for monitoring Tikosyn therapy 06/12/2018    Past Surgical History:  Procedure Laterality Date   BUBBLE STUDY  03/11/2020   Procedure: BUBBLE STUDY;  Surgeon: [name], MD;  Location: [place];  Service: Cardiovascular;;   CARDIOVERSION N/A 05/13/2018   Procedure: CARDIOVERSION;  Surgeon: [name], MD;  Location: [place];  Service: Cardiovascular;  Laterality: N/A;   CARDIOVERSION N/A 06/13/2018   Procedure: CARDIOVERSION;  Surgeon: [name], MD;  Location: [facility] ENDOSCOPY;  Service: Cardiovascular;  Laterality: N/A;   CARDIOVERSION N/A 06/23/2018   Procedure: CARDIOVERSION;  Surgeon: [name], MD;  Location: [place];  Service: Cardiovascular;  Laterality: N/A;   CARDIOVERSION N/A 03/11/2020   Procedure: CARDIOVERSION;  Surgeon: [name], MD;  Location: [place];  Service: Cardiovascular;  Laterality: N/A;   excision of actinic keratosis     EYE SURGERY      eye lift   IR CT HEAD LTD  01/27/2020   IR PERCUTANEOUS ART THROMBECTOMY/INFUSION INTRACRANIAL INC DIAG ANGIO  01/27/2020       IR PERCUTANEOUS ART THROMBECTOMY/INFUSION INTRACRANIAL INC DIAG ANGIO  01/27/2020   RADIOLOGY WITH ANESTHESIA N/A 01/27/2020   Procedure: IR WITH ANESTHESIA;  Surgeon: Radiologist, Medication, MD;  Location: [place];  Service: Radiology;  Laterality: N/A;   TEE WITHOUT CARDIOVERSION N/A 03/11/2020   Procedure: TRANSESOPHAGEAL ECHOCARDIOGRAM (TEE);  Surgeon: [name], MD;  Location: [place];  Service: Cardiovascular;  Laterality: N/A;   TONSILLECTOMY     WISDOM TOOTH EXTRACTION      There were no vitals filed for this visit.   Subjective Assessment - 07/01/20 0934    Subjective Pt is scheduled to get R AFO on Monday afternoon at [facility]. No falls, no pain, no changes.    Patient is accompained by: Family member   sister   Pertinent History PMH: HTN, paroxysmal a fib    Limitations Standing;Walking    Patient Stated Goals "wants everything working on R side again"    Currently in Pain? No/denies                             [facility] Adult PT Treatment/Exercise - 07/01/20 0946      Transfers   Transfers Sit to Stand;Stand to Sit;Squat Pivot Transfers    Sit to Stand 6: Modified independent (Device/Increase time)  Five time sit to stand comments  16.4 s from mat with hands with supervision assist    Stand to Sit 6: Modified independent (Device/Increase time)    Squat Pivot Transfers 6: Modified independent (Device/Increase time)      Ambulation/Gait   Ambulation/Gait Yes    Ambulation/Gait Assistance 4: Min guard    Ambulation/Gait Assistance Details Pt with improved R foot clearance and improved linear progression of RLE. Pt with 1 episode of foot catching on walker but pt able to correct on his own.    Ambulation Distance (Feet) 345 Feet    Assistive device Rolling walker;Other (Comment)   R hand grip, R AFO   Gait Pattern  Step-through pattern;Decreased hip/knee flexion - right;Poor foot clearance - right    Ambulation Surface Level;Indoor      Neuro Re-ed    Neuro Re-ed Details  in // bars: x10 squats with chair for tactile cue (to just touch, not sit all the way down) with cues for proper mechanics to activate glutes before NMR activites. Pt cued on R weight-shifting and keeping heels down. Pt then instructed in step up/downs with RLE x12 reps with min A and LLE leading x12 reps with CGA-min A. Pt with improved posture, improved hip extension when stepping down with LLE, and improved R foot clearance - with still episodes of R toe catching on step during activity. Pt then instructed in side-stepping in both directions with BUE support on bars and CGA x 4 trials. Pt cued on really picking up RLE and pt still with difficulty with stepping with LLE and bringing RLE back to midline- pt drags RLE >70% of time with PT giving tactile cues and min A to help with R foot clearance. Pt improved througout activity. Pt then instructed in forward and retro walking in // bars to faciltate more hip extension x 4 trials with CGA and unilateral UE support. Pt then instructed in forward and backwward stepping with LLE to increase R WBing and increased L step length. PT blocking R knee and providing min-mod A for stabiloty, as pt performed x20 reps without UE support.                    PT Short Term Goals - 07/01/20 1552      PT SHORT TERM GOAL #1   Title Pt will be mod I with all transfers with use of right AFO once he receives it for improved mobility.    Baseline Mod-I with all transfers 07/01/20    Time 4    Period Weeks    Status Achieved    Target Date 07/03/20      PT SHORT TERM GOAL #2   Title Pt will be able to ambulate > 345' with RW with right AFO and hand grip attachment min assist for improved mobility.    Baseline 345' with RW w/ hand grip and R AFO with min A    Time 4    Period Weeks    Status Achieved     Target Date 07/03/20      PT SHORT TERM GOAL #3   Title Pt will decrease 5 x sit to stand from 17.98 sec to <15 sec for improved balance and functional strength.    Baseline 06/03/20 17.98 sec from mat with hand; 16.4 s with hands from mat 07/01/20    Time 4    Period Weeks    Status On-going    Target Date 07/31/20  PT SHORT TERM GOAL #4   Title Pt will be instructed in proper AFO wear when new brace received.    Time 4    Period Weeks    Status New    Target Date 07/31/20             PT Long Term Goals - 06/03/20 1057      PT LONG TERM GOAL #1   Title Pt and pt's family members will be independent with final HEP in order to build upon functional gains made in therapy. ALL LTGS DUE 09/01/20    Baseline Pt has been performing current HEP with family but PT continues to add to it 06/03/20    Time 12    Period Weeks    Status On-going    Target Date 09/01/20      PT LONG TERM GOAL #2   Title Pt will ambulate >500' on varied surfaces supervision with RW and R AFO for improved household mobility and short community distances.    Baseline 230' with RW with right hand grip attachment and R AFO min assist on 06/03/20    Time 12    Period Weeks    Status Revised    Target Date 09/01/20      PT LONG TERM GOAL #3   Title Pt will decrease TUG from 27 sec to <20 sec for improved balance and decreased fall risk.    Baseline 06/03/20 TUG=27 sec with RW    Time 12    Period Weeks    Status New    Target Date 09/01/20      PT LONG TERM GOAL #4   Title Pt will be able to ambulate up/down curb with RW and R AFO CGA for improved community access.    Time 12    Period Weeks    Status New    Target Date 09/01/20                 Plan - 07/01/20 1517    Clinical Impression Statement Today's skilled PT session focused on continued gait training with R AFO and R hand grip on RW as well as continuing to work on [employer] activities to facility improved R weight-shifting and  improved R foot clearance. STGs were also reassessed and pt met 2/3 goals. Pt ambulated 345' with RW + hand grip attachement and R AFO with min A. Pt is now able to complete all functional transfers at mod-I. Pt is making great progress towards LTGs.    Personal Factors and Comorbidities Comorbidity 3+    Comorbidities dense L MCA CVA, paroxysmal A. fib on chronic Xarelto, nonischemic cardiomyopathy and hypertension.    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, works as an Electrical engineer Evolving/Moderate complexity    Rehab Potential Good    PT Frequency 2x / week    PT Duration 12 weeks    PT Treatment/Interventions ADLs/Self Care Home Management;Aquatic Therapy;DME Instruction;Gait training;Stair training;Functional mobility training;Therapeutic activities;Therapeutic exercise;Electrical Stimulation;Balance training;Neuromuscular re-education;Wheelchair mobility training;Orthotic Fit/Training;Patient/family education;Passive range of motion;Energy conservation    PT Next Visit Plan Continue with RLE strengthening (target hip extension), weight shifting/NMR to RLE. continue gait training with RW, try balance on compliant surfaces. AFO with t-strap and hand orthotic.    PT Home Exercise Plan on 06/28/20 - added standing at countet[place] and stepping LLE out and in    Consulted and Agree  with Plan of Care Patient;Family member/caregiver    Family Member Consulted brother-in-law, [name]           Patient will benefit from skilled therapeutic intervention in order to improve the following deficits and impairments:  Abnormal gait, Decreased activity tolerance, Decreased balance, Decreased cognition, Decreased mobility, Decreased coordination, Decreased range of motion, Decreased  endurance, Decreased strength, Hypomobility, Difficulty walking, Impaired UE functional use, Impaired vision/preception, Postural dysfunction, Impaired sensation  Visit Diagnosis: Muscle weakness (generalized)  Other abnormalities of gait and mobility  Unsteadiness on feet     Problem List Patient Active Problem List   Diagnosis Date Noted   Benign essential HTN 06/09/2020   Abnormality of gait 04/28/2020   Neurogenic bladder 03/17/2020   Sepsis due to pneumonia ([place]) 03/06/2020   History of stroke with residual deficit 03/06/2020   Transient hypotension 03/06/2020   Spastic hemiplegia affecting nondominant side ([place])    History of hypertension    Expressive aphasia    AKI (acute kidney injury) ([place])    Global aphasia    Hypoalbuminemia due to protein-calorie malnutrition ([place])    Dysphagia, post-stroke    Hyperlipidemia 02/02/2020   Dysphagia following cerebral infarction 02/02/2020   Aspiration pneumonia ([place]) 02/02/2020   Acute ischemic left MCA stroke ([place]) 02/02/2020   Acute respiratory failure (HCC)    Acute ischemic stroke ([place]) s/p clot retrieval L MCA & ACA A3 01/27/2020   Aspiration into airway 01/27/2020   Chronic anticoagulation 01/27/2020   Paroxysmal atrial fibrillation ([place]) 06/15/2018   Essential hypertension 06/15/2018   NICM (nonischemic cardiomyopathy) ([place]) 06/15/2018   Visit for monitoring Tikosyn therapy 06/12/2018    Rosalita Levan, SPT 07/01/2020, 4:03 PM  [facility] [address] [place], [place], [address] Phone: [phone]   Fax:  [phone]  Name: Dustin Schneider MRN: [id number] Date of Birth: Aug 09, 1956

## 2020-07-04 ENCOUNTER — Encounter: Payer: Self-pay | Admitting: Occupational Therapy

## 2020-07-04 ENCOUNTER — Ambulatory Visit: Payer: BC Managed Care – PPO | Admitting: Occupational Therapy

## 2020-07-04 ENCOUNTER — Other Ambulatory Visit: Payer: Self-pay

## 2020-07-04 ENCOUNTER — Encounter: Payer: Self-pay | Admitting: Physical Therapy

## 2020-07-04 ENCOUNTER — Ambulatory Visit: Payer: BC Managed Care – PPO | Admitting: Physical Therapy

## 2020-07-04 DIAGNOSIS — R278 Other lack of coordination: Secondary | ICD-10-CM

## 2020-07-04 DIAGNOSIS — M6281 Muscle weakness (generalized): Secondary | ICD-10-CM

## 2020-07-04 DIAGNOSIS — R2689 Other abnormalities of gait and mobility: Secondary | ICD-10-CM

## 2020-07-04 DIAGNOSIS — I69318 Other symptoms and signs involving cognitive functions following cerebral infarction: Secondary | ICD-10-CM

## 2020-07-04 DIAGNOSIS — I69351 Hemiplegia and hemiparesis following cerebral infarction affecting right dominant side: Secondary | ICD-10-CM

## 2020-07-04 DIAGNOSIS — R29818 Other symptoms and signs involving the nervous system: Secondary | ICD-10-CM

## 2020-07-04 NOTE — Therapy (Signed)
[facility] [address] [place], [place], [address] Phone: [phone]   Fax:  [phone]  Occupational Therapy Treatment  Patient Details  Name: Dustin Schneider MRN: [id number] Date of Birth: [birth year] No data recorded  Encounter Date: 07/04/2020   OT End of Session - 07/04/20 1219    Visit Number 30    Number of Visits 41    Date for OT Re-Evaluation 08/19/20    Authorization Type BC/BS - No auth required, covered 100%    Authorization Time Period week 3/8 for renewal period    OT Start Time 0937    OT Stop Time 1015    OT Time Calculation (min) 38 min    Activity Tolerance Patient tolerated treatment well    Behavior During Therapy [facility] for tasks assessed/performed           Past Medical History:  Diagnosis Date  . Hypertension   . NICM (nonischemic cardiomyopathy) ([place]) 06/15/2018   04/23/18: EF 45%, 09/15/18: NOrmal LVEF  . Paroxysmal atrial fibrillation ([place]) 06/15/2018  . Stroke ([place])   . Visit for monitoring Tikosyn therapy 06/12/2018    Past Surgical History:  Procedure Laterality Date  . BUBBLE STUDY  03/11/2020   Procedure: BUBBLE STUDY;  Surgeon: [name], MD;  Location: [place];  Service: Cardiovascular;;  . CARDIOVERSION N/A 05/13/2018   Procedure: CARDIOVERSION;  Surgeon: [name], MD;  Location: [facility] ENDOSCOPY;  Service: Cardiovascular;  Laterality: N/A;  . CARDIOVERSION N/A 06/13/2018   Procedure: CARDIOVERSION;  Surgeon: [name], MD;  Location: [facility] ENDOSCOPY;  Service: Cardiovascular;  Laterality: N/A;  . CARDIOVERSION N/A 06/23/2018   Procedure: CARDIOVERSION;  Surgeon: [name], MD;  Location: [facility] ENDOSCOPY;  Service: Cardiovascular;  Laterality: N/A;  . CARDIOVERSION N/A 03/11/2020   Procedure: CARDIOVERSION;  Surgeon: [name], MD;  Location: [place];  Service: Cardiovascular;  Laterality: N/A;  . excision of actinic keratosis    . EYE SURGERY     eye lift  . IR CT  HEAD LTD  01/27/2020  . IR PERCUTANEOUS ART THROMBECTOMY/INFUSION INTRACRANIAL INC DIAG ANGIO  01/27/2020      . IR PERCUTANEOUS ART THROMBECTOMY/INFUSION INTRACRANIAL INC DIAG ANGIO  01/27/2020  . RADIOLOGY WITH ANESTHESIA N/A 01/27/2020   Procedure: IR WITH ANESTHESIA;  Surgeon: Radiologist, Medication, MD;  Location: [place];  Service: Radiology;  Laterality: N/A;  . TEE WITHOUT CARDIOVERSION N/A 03/11/2020   Procedure: TRANSESOPHAGEAL ECHOCARDIOGRAM (TEE);  Surgeon: [name], MD;  Location: [place];  Service: Cardiovascular;  Laterality: N/A;  . TONSILLECTOMY    . WISDOM TOOTH EXTRACTION      There were no vitals filed for this visit.   Subjective Assessment - 07/04/20 1218    Subjective  Pt reports no pain    Pertinent History Lt MCA CVA 01/27/20, readmitted to hospital with HAP and fever on 03/06/20 and released 03/11/20. PMH: paroxsymal A-fib, HTN    Limitations fall    Currently in Pain? No/denies                   Treatment: Supine closed chain shoulder flexion and chest press with PVC pipe frame, min facilitation,  then unilateral triceps extension with RUE, min-mod facilitation.  midrange unilateral  shoulder flexion with UE ranger for shoulder flexion and abduction , min faciliation Wrist joint mobs for RUE followed by A/ROM wrist flexion/ extension Placing large pegs into pegboard with RUE, mod difficulty, and min v.c/ facilitation for increased fine motor  coordination.                  OT Short Term Goals - 06/13/20 1245      OT SHORT TERM GOAL #1   Title Independent with initial HEP    Time 4    Period Weeks    Status Achieved      OT SHORT TERM GOAL #2   Title Pt to perform low to mid level reaching of light objects RUE in prep for feeding self and bathing    Time 4    Period Weeks    Status Partially Met   low level, inconsistent mid level     OT SHORT TERM GOAL #3   Title Pt to feed self 50% of the time with Rt dominant hand and A/E  prn    Time 4    Period Weeks    Status On-going   06/13/20: Pt reports eating yogurt/breakfast and some finger foods, but using Lt hand for lunch/dinner)     OT SHORT TERM GOAL #4   Title Pt to perform UE dressing of pullover shirt mod I level    Time 4    Period Weeks    Status Achieved   met per pt and brother     OT SHORT TERM GOAL #5   Title Pt to perform LE dressing and all bathing with no more than mod assist using A/E and strategies prn    Time 4    Period Weeks    Status Achieved   04/20/20:  set-up for bedside bath, mod A for LE dressing     OT SHORT TERM GOAL #6   Title Pt to perform toileting and toilet transfers with no more than min assist overall    Time 4    Period Weeks    Status Achieved   supervision     OT SHORT TERM GOAL #7   Title Improve functional use of RUE as evidenced by performing 8 blocks on Box & Blocks test    Baseline 0    Time 4    Period Weeks    Status Achieved   04/20/20:  6 blocks, 05/05/20: 12 blocks            OT Long Term Goals - 06/13/20 1248      OT LONG TERM GOAL #1   Title Pt to be independent with updated HEP    Time 12    Period Weeks    Status On-going      OT LONG TERM GOAL #2   Title Pt to perform all BADLS at mod I level    Time 12    Period Weeks    Status On-going   min assist for LE dressing     OT LONG TERM GOAL #3   Title Pt to feed self 50% or greater with Rt dominant hand and grooming 25% or more with Rt hand    Time 12    Period Weeks    Status Revised      OT LONG TERM GOAL #4   Title Pt to retrieve light weight objects from high shelf RUE consistently    Time 12    Period Weeks    Status On-going      OT LONG TERM GOAL #5   Title Pt to demo 25 lbs grip strength or more Rt dominant hand for gripping, opening jars/containers    Time 12    Period Weeks  Status On-going   12.5 lbs     OT LONG TERM GOAL #6   Title Pt to perform simple meal prep and light cleaning tasks mod I level    Time 12     Period Weeks    Status Deferred   deferred d/t inability to stand/ambulate without assist and AFO     OT LONG TERM GOAL #7   Title Improve functional use RUE as evidenced by performing 25 blocks on Box & Blocks    Time 12    Period Weeks    Status Revised   12 blocks     OT LONG TERM GOAL #8   Title Pt to returned to modified art/sculpting tasks with task modifications and A/E prn    Time 12    Period Weeks    Status Deferred                 Plan - 07/04/20 1221    Clinical Impression Statement Pt is progressing towards goals, however he remains limited by wrist tightness and spasticity.    Occupational performance deficits (Please refer to evaluation for details): ADL's;IADL's;Work;Leisure;Social Participation    Body Structure / Function / Physical Skills ADL;ROM;Dexterity;Balance;IADL;Body mechanics;Improper spinal/pelvic alignment;Sensation;Mobility;Flexibility;Strength;Coordination;FMC;Tone;UE functional use;Decreased knowledge of use of DME;Proprioception    Rehab Potential Good    Comorbidities Affecting Occupational Performance: May have comorbidities impacting occupational performance    OT Frequency 2x / week    OT Duration 8 weeks   additional 8 weeks beginning week of 06/20/20   OT Treatment/Interventions Self-care/ADL training;Therapeutic exercise;Functional Mobility Training;Neuromuscular education;Manual Therapy;Splinting;Aquatic Therapy;Manual lymph drainage;Therapeutic activities;Coping strategies training;DME and/or AE instruction;Electrical Stimulation;Fluidtherapy;Passive range of motion;Patient/family education    Plan continue NMR and functional use RUE    Consulted and Agree with Plan of Care Patient    Family Member Consulted brother           Patient will benefit from skilled therapeutic intervention in order to improve the following deficits and impairments:   Body Structure / Function / Physical Skills: ADL, ROM, Dexterity, Balance, IADL, Body  mechanics, Improper spinal/pelvic alignment, Sensation, Mobility, Flexibility, Strength, Coordination, FMC, Tone, UE functional use, Decreased knowledge of use of DME, Proprioception       Visit Diagnosis: Other lack of coordination  Other symptoms and signs involving the nervous system  Hemiplegia and hemiparesis following cerebral infarction affecting right dominant side (HCC)  Other symptoms and signs involving cognitive functions following cerebral infarction    Problem List Patient Active Problem List   Diagnosis Date Noted  . Benign essential HTN 06/09/2020  . Abnormality of gait 04/28/2020  . Neurogenic bladder 03/17/2020  . Sepsis due to pneumonia ([place]) 03/06/2020  . History of stroke with residual deficit 03/06/2020  . Transient hypotension 03/06/2020  . Spastic hemiplegia affecting nondominant side ([place])   . History of hypertension   . Expressive aphasia   . AKI (acute kidney injury) ([place])   . Global aphasia   . Hypoalbuminemia due to protein-calorie malnutrition ([place])   . Dysphagia, post-stroke   . Hyperlipidemia 02/02/2020  . Dysphagia following cerebral infarction 02/02/2020  . Aspiration pneumonia ([place]) 02/02/2020  . Acute ischemic left MCA stroke ([place]) 02/02/2020  . Acute respiratory failure ([place])   . Acute ischemic stroke [facility]) s/p clot retrieval L MCA & ACA A3 01/27/2020  . Aspiration into airway 01/27/2020  . Chronic anticoagulation 01/27/2020  . Paroxysmal atrial fibrillation ([place]) 06/15/2018  . Essential hypertension 06/15/2018  . NICM (nonischemic cardiomyopathy) ([place]) 06/15/2018  .  Visit for monitoring Tikosyn therapy 06/12/2018    Taziyah Iannuzzi 07/04/2020, 12:25 PM  [facility] [address] [place], [place], [address] Phone: [phone]   Fax:  [phone]  Name: Dustin Schneider MRN: [id number] Date of Birth: 1955-12-24

## 2020-07-05 ENCOUNTER — Ambulatory Visit: Payer: BC Managed Care – PPO | Admitting: Occupational Therapy

## 2020-07-05 ENCOUNTER — Telehealth: Payer: Self-pay | Admitting: Physical Therapy

## 2020-07-05 DIAGNOSIS — M6281 Muscle weakness (generalized): Secondary | ICD-10-CM

## 2020-07-05 DIAGNOSIS — R29818 Other symptoms and signs involving the nervous system: Secondary | ICD-10-CM

## 2020-07-05 DIAGNOSIS — R278 Other lack of coordination: Secondary | ICD-10-CM

## 2020-07-05 NOTE — Telephone Encounter (Signed)
Order placed, to sign then fax to [facility] PT [phone]

## 2020-07-05 NOTE — Therapy (Addendum)
[facility] [address] [place], [place], [address] Phone: [phone]   Fax:  [phone]  Physical Therapy Treatment  Patient Details  Name: Dustin Schneider MRN: [id number] Date of Birth: Oct 18, 1955 Referring Provider (PT): Lauraine Rinne, PA-C (followed by Dr. Delice Lesch)   Encounter Date: 07/04/2020   PT End of Session - 07/05/20 1413    Visit Number 33    Number of Visits 51    Date for PT Re-Evaluation 09/01/20    Authorization Type BCBS    PT Start Time [phone]    PT Stop Time 0930    PT Time Calculation (min) 44 min    Equipment Utilized During Treatment Gait belt    Activity Tolerance Patient tolerated treatment well    Behavior During Therapy [facility] for tasks assessed/performed           Past Medical History:  Diagnosis Date  . Hypertension   . NICM (nonischemic cardiomyopathy) ([place]) 06/15/2018   04/23/18: EF 45%, 09/15/18: NOrmal LVEF  . Paroxysmal atrial fibrillation ([place]) 06/15/2018  . Stroke ([place])   . Visit for monitoring Tikosyn therapy 06/12/2018    Past Surgical History:  Procedure Laterality Date  . BUBBLE STUDY  03/11/2020   Procedure: BUBBLE STUDY;  Surgeon: [name], MD;  Location: [place];  Service: Cardiovascular;;  . CARDIOVERSION N/A 05/13/2018   Procedure: CARDIOVERSION;  Surgeon: [name], MD;  Location: [facility] ENDOSCOPY;  Service: Cardiovascular;  Laterality: N/A;  . CARDIOVERSION N/A 06/13/2018   Procedure: CARDIOVERSION;  Surgeon: [name], MD;  Location: [facility] ENDOSCOPY;  Service: Cardiovascular;  Laterality: N/A;  . CARDIOVERSION N/A 06/23/2018   Procedure: CARDIOVERSION;  Surgeon: [name], MD;  Location: [facility] ENDOSCOPY;  Service: Cardiovascular;  Laterality: N/A;  . CARDIOVERSION N/A 03/11/2020   Procedure: CARDIOVERSION;  Surgeon: [name], MD;  Location: [place];  Service: Cardiovascular;  Laterality: N/A;  . excision of actinic keratosis    . EYE SURGERY      eye lift  . IR CT HEAD LTD  01/27/2020  . IR PERCUTANEOUS ART THROMBECTOMY/INFUSION INTRACRANIAL INC DIAG ANGIO  01/27/2020      . IR PERCUTANEOUS ART THROMBECTOMY/INFUSION INTRACRANIAL INC DIAG ANGIO  01/27/2020  . RADIOLOGY WITH ANESTHESIA N/A 01/27/2020   Procedure: IR WITH ANESTHESIA;  Surgeon: Radiologist, Medication, MD;  Location: [place];  Service: Radiology;  Laterality: N/A;  . TEE WITHOUT CARDIOVERSION N/A 03/11/2020   Procedure: TRANSESOPHAGEAL ECHOCARDIOGRAM (TEE);  Surgeon: [name], MD;  Location: [place];  Service: Cardiovascular;  Laterality: N/A;  . TONSILLECTOMY    . WISDOM TOOTH EXTRACTION      There were no vitals filed for this visit.   Subjective Assessment - 07/04/20 0849    Subjective Getting R AFO later today. States that his R groin area is feeling a little bit tight.    Patient is accompained by: Family member   sister   Pertinent History PMH: HTN, paroxysmal a fib    Limitations Standing;Walking    Patient Stated Goals "wants everything working on R side again"    Currently in Pain? No/denies                      07/05/20 0001  Transfers  Transfers Sit to Stand;Stand to Sit;Squat Pivot Transfers  Sit to Stand 5: Supervision  Sit to Stand Details (indicate cue type and reason) from mat table  Stand to Sit 6: Modified independent (Device/increase time)  Squat Pivot  Transfers 6: Modified independent (Device/increase time)  Ambulation/Gait  Ambulation/Gait Yes  Ambulation/Gait Assistance 4: Min guard;4: Min assist  Ambulation/Gait Assistance Details pt needing intermittent min A during end of bout of gait for incr hip/knee flexion with RLE, cues intermittently for posture  Ambulation Distance (Feet) 345 Feet  Assistive device Rolling walker;Other (Comment) (R AFO, R hand orthosis)  Gait Pattern Step-through pattern;Decreased hip/knee flexion - right;Poor foot clearance - right  Ambulation Surface Level;Indoor  Gait Comments performed 4 x  30' weaving in and out of 6 cones to practice turns and obstacle negotiation, pt initially performing with decr step length with LLE, improved stance time with RLE and incr step length with LLE with incr reps  Therapeutic Activites   Therapeutic Activities Other Therapeutic Activities  Other Therapeutic Activities pt to pick up AFO this afternoon - discussed with pt that just because he has his AFO does not mean that he is safe to begin walking at home, will need to practice in therapy first with [name]/[name] to make sure pt is safe to practice at home. Discussed with pt and pt's gf wear schedule of AFO - beginning to wear for 1-2 hours at a time (3 times a day during the first week) and taking off for a couple hours and assessing for any redness/pain and gradually increasing time spent, provided written handout to pt and gf.   Exercises  Exercises Other Exercises  Other Exercises  standing at edge of mat table with RW: lifting RLE on and off 4" step, cues for posture and glute activation when weight shifting towards LLE, needing intermittent min A at times when fatigued for incr hip/knee flexion with stepping RLE off  Knee/Hip Exercises: Supine  Bridges Strengthening;AROM;10 reps  Bridges Limitations performed with cues for full ROM and holding at end of ROM for 3 seconds, performed an additional 10 reps with therapist giving resistance to R ASIS while lifting and when lowering down for incr eccentric control                    PT Education - 07/05/20 1413    Education Details see TA, AFO wear schedule    Person(s) Educated Patient   pt's gf   Methods Explanation    Comprehension Verbalized understanding            PT Short Term Goals - 07/01/20 1552      PT SHORT TERM GOAL #1   Title Pt will be mod I with all transfers with use of right AFO once he receives it for improved mobility.    Baseline Mod-I with all transfers 07/01/20    Time 4    Period Weeks    Status Achieved     Target Date 07/03/20      PT SHORT TERM GOAL #2   Title Pt will be able to ambulate > 345' with RW with right AFO and hand grip attachment min assist for improved mobility.    Baseline 345' with RW w/ hand grip and R AFO with min A    Time 4    Period Weeks    Status Achieved    Target Date 07/03/20      PT SHORT TERM GOAL #3   Title Pt will decrease 5 x sit to stand from 17.98 sec to <15 sec for improved balance and functional strength.    Baseline 06/03/20 17.98 sec from mat with hand; 16.4 s with hands from mat 07/01/20  Time 4    Period Weeks    Status On-going    Target Date 07/31/20      PT SHORT TERM GOAL #4   Title Pt will be instructed in proper AFO wear when new brace received.    Time 4    Period Weeks    Status New    Target Date 07/31/20             PT Long Term Goals - 06/03/20 1057      PT LONG TERM GOAL #1   Title Pt and pt's family members will be independent with final HEP in order to build upon functional gains made in therapy. ALL LTGS DUE 09/01/20    Baseline Pt has been performing current HEP with family but PT continues to add to it 06/03/20    Time 12    Period Weeks    Status On-going    Target Date 09/01/20      PT LONG TERM GOAL #2   Title Pt will ambulate >500' on varied surfaces supervision with RW and R AFO for improved household mobility and short community distances.    Baseline 230' with RW with right hand grip attachment and R AFO min assist on 06/03/20    Time 12    Period Weeks    Status Revised    Target Date 09/01/20      PT LONG TERM GOAL #3   Title Pt will decrease TUG from 27 sec to <20 sec for improved balance and decreased fall risk.    Baseline 06/03/20 TUG=27 sec with RW    Time 12    Period Weeks    Status New    Target Date 09/01/20      PT LONG TERM GOAL #4   Title Pt will be able to ambulate up/down curb with RW and R AFO CGA for improved community access.    Time 12    Period Weeks    Status New     Target Date 09/01/20              07/05/20 2109  Plan  Clinical Impression Statement Pt will pick up new R AFO this afternoon. Today's skilled session focused on RLE>LLE strengthening (hip extensor and hip flexion), and gait training. Pt with improvements of RLE foot clearance, but did need intermittent bouts of min A towards end of bout of gait to fully clear RLE. Will continue to progress towards LTGs.  Personal Factors and Comorbidities Comorbidity 3+  Comorbidities dense L MCA CVA, paroxysmal A. fib on chronic Xarelto, nonischemic cardiomyopathy and hypertension.  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, works as an Gaffer)  Pt will benefit from skilled therapeutic intervention in order to improve on the following deficits Abnormal gait;Decreased activity tolerance;Decreased balance;Decreased cognition;Decreased mobility;Decreased coordination;Decreased range of motion;Decreased endurance;Decreased strength;Hypomobility;Difficulty walking;Impaired UE functional use;Impaired vision/preception;Postural dysfunction;Impaired sensation  Stability/Clinical Decision Making Evolving/Moderate complexity  Rehab Potential Good  PT Frequency 2x / week  PT Duration 12 weeks  PT Treatment/Interventions ADLs/Self Care Home Management;Aquatic Therapy;DME Instruction;Gait training;Stair training;Functional mobility training;Therapeutic activities;Therapeutic exercise;Electrical Stimulation;Balance training;Neuromuscular re-education;Wheelchair mobility training;Orthotic Fit/Training;Patient/family education;Passive range of motion;Energy conservation  PT Next Visit Plan i put in order request for RW. pt with new AFO - review how to take on/off and wear schedule, gait training with family when appropriate. hip extensor  strengthening RLE, continue step ups. weight shifting/NMR to RLE. try balance on compliant surfaces.  PT Home Exercise Plan on 06/28/20 - added standing at [place] and stepping LLE out and in  Consulted and Agree with Plan of Care Patient;Family member/caregiver  Family Member Consulted brother-in-law, [name]         Patient will benefit from skilled therapeutic intervention in order to improve the following deficits and impairments:     Visit Diagnosis: Other lack of coordination  Other symptoms and signs involving the nervous system  Other symptoms and signs involving cognitive functions following cerebral infarction  Muscle weakness (generalized)  Other abnormalities of gait and mobility     Problem List Patient Active Problem List   Diagnosis Date Noted  . Benign essential HTN 06/09/2020  . Abnormality of gait 04/28/2020  . Neurogenic bladder 03/17/2020  . Sepsis due to pneumonia ([place]) 03/06/2020  . History of stroke with residual deficit 03/06/2020  . Transient hypotension 03/06/2020  . Spastic hemiplegia affecting nondominant side ([place])   . History of hypertension   . Expressive aphasia   . AKI (acute kidney injury) ([place])   . Global aphasia   . Hypoalbuminemia due to protein-calorie malnutrition ([place])   . Dysphagia, post-stroke   . Hyperlipidemia 02/02/2020  . Dysphagia following cerebral infarction 02/02/2020  . Aspiration pneumonia ([place]) 02/02/2020  . Acute ischemic left MCA stroke ([place]) 02/02/2020  . Acute respiratory failure ([place])   . Acute ischemic stroke [facility]) s/p clot retrieval L MCA & ACA A3 01/27/2020  . Aspiration into airway 01/27/2020  . Chronic anticoagulation 01/27/2020  . Paroxysmal atrial fibrillation ([place]) 06/15/2018  . Essential hypertension 06/15/2018  . NICM (nonischemic cardiomyopathy) ([place]) 06/15/2018  . Visit for monitoring Tikosyn therapy 06/12/2018    Arliss Journey, PT, DPT  07/05/2020, 2:14 PM  [facility] [address] [place], [place], [address] Phone: [phone]   Fax:  [phone]  Name: Dustin Schneider MRN: [id number] Date of Birth: [birth year]

## 2020-07-05 NOTE — Telephone Encounter (Addendum)
  Dustin Schneider has been seen by physical therapy at [facility]. The patient would benefit from an order for a two wheeled rolling walker for safety with gait after pt receives his AFO and is cleared for gait training with family at home.  If you agree, please place an order in [facility] workque in [facility] or fax the order to [phone]. Thank you, [name], PT, DPT 07/05/20 2:16 PM    [facility] [address] [place], [place]  [address] Phone:  [phone] Fax:  [phone]

## 2020-07-06 ENCOUNTER — Other Ambulatory Visit: Payer: Self-pay

## 2020-07-06 ENCOUNTER — Encounter: Payer: BC Managed Care – PPO | Admitting: Occupational Therapy

## 2020-07-06 ENCOUNTER — Ambulatory Visit: Payer: BC Managed Care – PPO

## 2020-07-06 DIAGNOSIS — M6281 Muscle weakness (generalized): Secondary | ICD-10-CM

## 2020-07-06 DIAGNOSIS — R2689 Other abnormalities of gait and mobility: Secondary | ICD-10-CM

## 2020-07-06 DIAGNOSIS — I69351 Hemiplegia and hemiparesis following cerebral infarction affecting right dominant side: Secondary | ICD-10-CM | POA: Diagnosis not present

## 2020-07-06 NOTE — Therapy (Signed)
[facility] [address] [place], [place], [address] Phone: [phone]   Fax:  [phone]  Physical Therapy Treatment  Patient Details  Name: Dustin Schneider MRN: [id number] Date of Birth: 1956/04/26 Referring Provider (PT): Lauraine Rinne, PA-C (followed by Dr. Delice Lesch)   Encounter Date: 07/06/2020   PT End of Session - 07/06/20 0850    Visit Number 34    Number of Visits 48    Date for PT Re-Evaluation 09/01/20    Authorization Type BCBS    PT Start Time [phone]    PT Stop Time 0930    PT Time Calculation (min) 43 min    Equipment Utilized During Treatment Gait belt    Activity Tolerance Patient tolerated treatment well    Behavior During Therapy [facility] for tasks assessed/performed           Past Medical History:  Diagnosis Date  . Hypertension   . NICM (nonischemic cardiomyopathy) ([place]) 06/15/2018   04/23/18: EF 45%, 09/15/18: NOrmal LVEF  . Paroxysmal atrial fibrillation ([place]) 06/15/2018  . Stroke ([place])   . Visit for monitoring Tikosyn therapy 06/12/2018    Past Surgical History:  Procedure Laterality Date  . BUBBLE STUDY  03/11/2020   Procedure: BUBBLE STUDY;  Surgeon: [name], MD;  Location: [place];  Service: Cardiovascular;;  . CARDIOVERSION N/A 05/13/2018   Procedure: CARDIOVERSION;  Surgeon: [name], MD;  Location: [facility] ENDOSCOPY;  Service: Cardiovascular;  Laterality: N/A;  . CARDIOVERSION N/A 06/13/2018   Procedure: CARDIOVERSION;  Surgeon: [name], MD;  Location: [facility] ENDOSCOPY;  Service: Cardiovascular;  Laterality: N/A;  . CARDIOVERSION N/A 06/23/2018   Procedure: CARDIOVERSION;  Surgeon: [name], MD;  Location: [facility] ENDOSCOPY;  Service: Cardiovascular;  Laterality: N/A;  . CARDIOVERSION N/A 03/11/2020   Procedure: CARDIOVERSION;  Surgeon: [name], MD;  Location: [place];  Service: Cardiovascular;  Laterality: N/A;  . excision of actinic keratosis    . EYE SURGERY      eye lift  . IR CT HEAD LTD  01/27/2020  . IR PERCUTANEOUS ART THROMBECTOMY/INFUSION INTRACRANIAL INC DIAG ANGIO  01/27/2020      . IR PERCUTANEOUS ART THROMBECTOMY/INFUSION INTRACRANIAL INC DIAG ANGIO  01/27/2020  . RADIOLOGY WITH ANESTHESIA N/A 01/27/2020   Procedure: IR WITH ANESTHESIA;  Surgeon: Radiologist, Medication, MD;  Location: [place];  Service: Radiology;  Laterality: N/A;  . TEE WITHOUT CARDIOVERSION N/A 03/11/2020   Procedure: TRANSESOPHAGEAL ECHOCARDIOGRAM (TEE);  Surgeon: [name], MD;  Location: [place];  Service: Cardiovascular;  Laterality: N/A;  . TONSILLECTOMY    . WISDOM TOOTH EXTRACTION      There were no vitals filed for this visit.   Subjective Assessment - 07/06/20 0850    Subjective Pt got his new AFO. Reports doing well with it. Has tried some standing. Wearing 2 hours 2x/day.    Patient is accompained by: Family member   sister   Pertinent History PMH: HTN, paroxysmal a fib    Limitations Standing;Walking    Patient Stated Goals "wants everything working on R side again"    Currently in Pain? No/denies                             [facility] Adult PT Treatment/Exercise - 07/06/20 0851      Transfers   Transfers Sit to Stand;Stand to Sit    Sit to Stand 5: Supervision    Stand to Sit 5:  Supervision      Ambulation/Gait   Ambulation/Gait Yes    Ambulation/Gait Assistance 4: Min guard;4: Min assist    Ambulation/Gait Assistance Details Pt was given verbal cues to increase right foot clearance from hip and slow down some to focus on foot placement. Pt was also given tactile cues at pelvis to get more anterior pelvic rotation and for upright posture. No instability noted at ankle with new AFO controlling well. Does still have some ER at right hip.    Ambulation Distance (Feet) 345 Feet   230' x 1 at end of session   Assistive device Rolling walker   right AFO, right hand grip attachment   Gait Pattern Step-through pattern;Decreased hip/knee  flexion - right    Ambulation Surface Level;Indoor    Gait Comments Pt has new right solid custom AFO with lateral build up on the bottom      Therapeutic Activites    Therapeutic Activities Other Therapeutic Activities    Other Therapeutic Activities PT reviewed gradually increasing wear time schedule with pt and caregivers. Pt doing 2 hours, 2x/day. Reviewed checking skin for any redness and if noted would need to back off. PT assisted caregiver to get w/c in car and how to fold up w/c. Gave pt prescription for RW. They should have hand grip attachment at home.      Neuro Re-ed    Neuro Re-ed Details  Standing at walker: Tapping 4" step with LLE to increase right weight shift x 10 then standing with LLE on step with raising up LUE x 10 in to flexion CGA. Step-ups on 4" step with RLE x 5 with verbal cues to shift weight over leg and stay up tall. In // bars: walking forwards and backwards x 4 laps each with tactile cues at pelvis to increase hip flexion and block trunk flexion. Also had visual cues in mirror and verbal cues to slow down and fully extend right hip. Tapping 2x4" beam with right RLE and lifting back off to extend back again with tactile assist to prevent trunk flexion with extension. Mini-squats in 1UE support x 10 with verbal and tactile cues to weight shift to the right. Sit to stand x 6 from chair with left foot on 2x4" beam to help facilitate right weight shift. Pt was cued to push from chair and provided min assist to weight shift pt over right more. Also visual cues in mirror.                   PT Education - 07/06/20 0949    Education Details Reviewed AFO wear time schedule and again reminded not to try walking at home yet until can train his primary caregivers.    Person(s) Educated Patient;Caregiver(s)    Methods Explanation    Comprehension Verbalized understanding            PT Short Term Goals - 07/01/20 1552      PT SHORT TERM GOAL #1   Title Pt will be  mod I with all transfers with use of right AFO once he receives it for improved mobility.    Baseline Mod-I with all transfers 07/01/20    Time 4    Period Weeks    Status Achieved    Target Date 07/03/20      PT SHORT TERM GOAL #2   Title Pt will be able to ambulate > 345' with RW with right AFO and hand grip attachment min assist for improved mobility.  Baseline 345' with RW w/ hand grip and R AFO with min A    Time 4    Period Weeks    Status Achieved    Target Date 07/03/20      PT SHORT TERM GOAL #3   Title Pt will decrease 5 x sit to stand from 17.98 sec to <15 sec for improved balance and functional strength.    Baseline 06/03/20 17.98 sec from mat with hand; 16.4 s with hands from mat 07/01/20    Time 4    Period Weeks    Status On-going    Target Date 07/31/20      PT SHORT TERM GOAL #4   Title Pt will be instructed in proper AFO wear when new brace received.    Time 4    Period Weeks    Status New    Target Date 07/31/20             PT Long Term Goals - 06/03/20 1057      PT LONG TERM GOAL #1   Title Pt and pt's family members will be independent with final HEP in order to build upon functional gains made in therapy. ALL LTGS DUE 09/01/20    Baseline Pt has been performing current HEP with family but PT continues to add to it 06/03/20    Time 12    Period Weeks    Status On-going    Target Date 09/01/20      PT LONG TERM GOAL #2   Title Pt will ambulate >500' on varied surfaces supervision with RW and R AFO for improved household mobility and short community distances.    Baseline 230' with RW with right hand grip attachment and R AFO min assist on 06/03/20    Time 12    Period Weeks    Status Revised    Target Date 09/01/20      PT LONG TERM GOAL #3   Title Pt will decrease TUG from 27 sec to <20 sec for improved balance and decreased fall risk.    Baseline 06/03/20 TUG=27 sec with RW    Time 12    Period Weeks    Status New    Target Date  09/01/20      PT LONG TERM GOAL #4   Title Pt will be able to ambulate up/down curb with RW and R AFO CGA for improved community access.    Time 12    Period Weeks    Status New    Target Date 09/01/20                 Plan - 07/06/20 5277    Clinical Impression Statement Pt did well with new right AFO. Denied any pain and no redness noted from brace. Good right ankle control with brace. Continued to work on gait training with new AFO and strengthening on RLE.    Personal Factors and Comorbidities Comorbidity 3+    Comorbidities dense L MCA CVA, paroxysmal A. fib on chronic Xarelto, nonischemic cardiomyopathy and hypertension.    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, works as an Electrical engineer Evolving/Moderate complexity    Rehab Potential Good    PT Frequency 2x / week    PT Duration 12 weeks    PT Treatment/Interventions ADLs/Self Care Home Management;Aquatic Therapy;DME Instruction;Gait training;Stair training;Functional mobility training;Therapeutic activities;Therapeutic exercise;Electrical Stimulation;Balance training;Neuromuscular re-education;Wheelchair mobility  training;Orthotic Fit/Training;Patient/family education;Passive range of motion;Energy conservation    PT Next Visit Plan Did they get walker? How is skin doing?  gait training with family. hip extensor strengthening RLE, continue step ups. weight shifting/NMR to RLE. try balance on compliant surfaces.    PT Home Exercise Plan on 06/28/20 - added standing at [place] and stepping LLE out and in    Consulted and Agree with Plan of Care Patient;Family member/caregiver    Family Member Consulted brother-in-law, [name]           Patient will benefit from skilled therapeutic intervention in order  to improve the following deficits and impairments:  Abnormal gait, Decreased activity tolerance, Decreased balance, Decreased cognition, Decreased mobility, Decreased coordination, Decreased range of motion, Decreased endurance, Decreased strength, Hypomobility, Difficulty walking, Impaired UE functional use, Impaired vision/preception, Postural dysfunction, Impaired sensation  Visit Diagnosis: Other abnormalities of gait and mobility  Muscle weakness (generalized)  Hemiplegia and hemiparesis following cerebral infarction affecting right dominant side [facility])     Problem List Patient Active Problem List   Diagnosis Date Noted  . Benign essential HTN 06/09/2020  . Abnormality of gait 04/28/2020  . Neurogenic bladder 03/17/2020  . Sepsis due to pneumonia ([place]) 03/06/2020  . History of stroke with residual deficit 03/06/2020  . Transient hypotension 03/06/2020  . Spastic hemiplegia affecting nondominant side ([place])   . History of hypertension   . Expressive aphasia   . AKI (acute kidney injury) ([place])   . Global aphasia   . Hypoalbuminemia due to protein-calorie malnutrition ([place])   . Dysphagia, post-stroke   . Hyperlipidemia 02/02/2020  . Dysphagia following cerebral infarction 02/02/2020  . Aspiration pneumonia ([place]) 02/02/2020  . Acute ischemic left MCA stroke ([place]) 02/02/2020  . Acute respiratory failure ([place])   . Acute ischemic stroke [facility]) s/p clot retrieval L MCA & ACA A3 01/27/2020  . Aspiration into airway 01/27/2020  . Chronic anticoagulation 01/27/2020  . Paroxysmal atrial fibrillation ([place]) 06/15/2018  . Essential hypertension 06/15/2018  . NICM (nonischemic cardiomyopathy) ([place]) 06/15/2018  . Visit for monitoring Tikosyn therapy 06/12/2018    Electa Sniff, PT, DPT, NCS 07/06/2020, 9:55 AM  [facility] [address] [place], [place], [address] Phone: [phone]   Fax:  [phone]  Name: Dustin Schneider MRN: [id number] Date of Birth: 11/25/1955

## 2020-07-06 NOTE — Telephone Encounter (Signed)
Signed and faxed to [facility], [place] fax confirmation received.

## 2020-07-11 ENCOUNTER — Other Ambulatory Visit: Payer: Self-pay

## 2020-07-11 ENCOUNTER — Ambulatory Visit: Payer: BC Managed Care – PPO | Admitting: Physical Therapy

## 2020-07-11 ENCOUNTER — Encounter: Payer: Self-pay | Admitting: Physical Therapy

## 2020-07-11 ENCOUNTER — Ambulatory Visit: Payer: BC Managed Care – PPO | Admitting: Occupational Therapy

## 2020-07-11 ENCOUNTER — Ambulatory Visit: Payer: BC Managed Care – PPO

## 2020-07-11 DIAGNOSIS — M6281 Muscle weakness (generalized): Secondary | ICD-10-CM

## 2020-07-11 DIAGNOSIS — I69351 Hemiplegia and hemiparesis following cerebral infarction affecting right dominant side: Secondary | ICD-10-CM

## 2020-07-11 DIAGNOSIS — R278 Other lack of coordination: Secondary | ICD-10-CM

## 2020-07-11 DIAGNOSIS — R2689 Other abnormalities of gait and mobility: Secondary | ICD-10-CM

## 2020-07-11 DIAGNOSIS — R29818 Other symptoms and signs involving the nervous system: Secondary | ICD-10-CM

## 2020-07-11 NOTE — Therapy (Signed)
[facility] [address] [place], [place], [address] Phone: [phone]   Fax:  [phone]  Occupational Therapy Treatment  Patient Details  Name: Dustin Schneider MRN: [id number] Date of Birth: [birth year] No data recorded  Encounter Date: 07/11/2020   OT End of Session - 07/11/20 1038    Visit Number 31    Number of Visits 41    Date for OT Re-Evaluation 08/19/20    Authorization Type BC/BS - No auth required, covered 100%    Authorization Time Period week 4/8 for renewal period    OT Start Time 1015    OT Stop Time 1100    OT Time Calculation (min) 45 min    Activity Tolerance Patient tolerated treatment well    Behavior During Therapy [facility] for tasks assessed/performed           Past Medical History:  Diagnosis Date  . Hypertension   . NICM (nonischemic cardiomyopathy) ([place]) 06/15/2018   04/23/18: EF 45%, 09/15/18: NOrmal LVEF  . Paroxysmal atrial fibrillation ([place]) 06/15/2018  . Stroke ([place])   . Visit for monitoring Tikosyn therapy 06/12/2018    Past Surgical History:  Procedure Laterality Date  . BUBBLE STUDY  03/11/2020   Procedure: BUBBLE STUDY;  Surgeon: [name], MD;  Location: [place];  Service: Cardiovascular;;  . CARDIOVERSION N/A 05/13/2018   Procedure: CARDIOVERSION;  Surgeon: [name], MD;  Location: [facility] ENDOSCOPY;  Service: Cardiovascular;  Laterality: N/A;  . CARDIOVERSION N/A 06/13/2018   Procedure: CARDIOVERSION;  Surgeon: [name], MD;  Location: [facility] ENDOSCOPY;  Service: Cardiovascular;  Laterality: N/A;  . CARDIOVERSION N/A 06/23/2018   Procedure: CARDIOVERSION;  Surgeon: [name], MD;  Location: [facility] ENDOSCOPY;  Service: Cardiovascular;  Laterality: N/A;  . CARDIOVERSION N/A 03/11/2020   Procedure: CARDIOVERSION;  Surgeon: [name], MD;  Location: [place];  Service: Cardiovascular;  Laterality: N/A;  . excision of actinic keratosis    . EYE SURGERY     eye lift  . IR CT  HEAD LTD  01/27/2020  . IR PERCUTANEOUS ART THROMBECTOMY/INFUSION INTRACRANIAL INC DIAG ANGIO  01/27/2020      . IR PERCUTANEOUS ART THROMBECTOMY/INFUSION INTRACRANIAL INC DIAG ANGIO  01/27/2020  . RADIOLOGY WITH ANESTHESIA N/A 01/27/2020   Procedure: IR WITH ANESTHESIA;  Surgeon: Radiologist, Medication, MD;  Location: [place];  Service: Radiology;  Laterality: N/A;  . TEE WITHOUT CARDIOVERSION N/A 03/11/2020   Procedure: TRANSESOPHAGEAL ECHOCARDIOGRAM (TEE);  Surgeon: [name], MD;  Location: [place];  Service: Cardiovascular;  Laterality: N/A;  . TONSILLECTOMY    . WISDOM TOOTH EXTRACTION      There were no vitals filed for this visit.   Subjective Assessment - 07/11/20 1019    Subjective  Pt reports no pain    Pertinent History Lt MCA CVA 01/27/20, readmitted to hospital with HAP and fever on 03/06/20 and released 03/11/20. PMH: paroxsymal A-fib, HTN    Limitations fall    Currently in Pain? No/denies           Supine: chest press using PVC frame x 10 w/ min facilitation initially - able to do w/o assist after a few reps. Elbow extension against gravity x 10 w/ support to upper arm.  Seated: AA/ROM holding pool noodle down to floor for elbow extension and sh stretch; progressed to gravity elim plane (lap level).  NMES x 10 min. For simultaneous wrist and finger extension followed by stretching in wrist and finger extension.  Pt placing  large pegs in pegboard mod to max difficulty and drops                       OT Short Term Goals - 06/13/20 1245      OT SHORT TERM GOAL #1   Title Independent with initial HEP    Time 4    Period Weeks    Status Achieved      OT SHORT TERM GOAL #2   Title Pt to perform low to mid level reaching of light objects RUE in prep for feeding self and bathing    Time 4    Period Weeks    Status Partially Met   low level, inconsistent mid level     OT SHORT TERM GOAL #3   Title Pt to feed self 50% of the time with Rt dominant  hand and A/E prn    Time 4    Period Weeks    Status On-going   06/13/20: Pt reports eating yogurt/breakfast and some finger foods, but using Lt hand for lunch/dinner)     OT SHORT TERM GOAL #4   Title Pt to perform UE dressing of pullover shirt mod I level    Time 4    Period Weeks    Status Achieved   met per pt and brother     OT SHORT TERM GOAL #5   Title Pt to perform LE dressing and all bathing with no more than mod assist using A/E and strategies prn    Time 4    Period Weeks    Status Achieved   04/20/20:  set-up for bedside bath, mod A for LE dressing     OT SHORT TERM GOAL #6   Title Pt to perform toileting and toilet transfers with no more than min assist overall    Time 4    Period Weeks    Status Achieved   supervision     OT SHORT TERM GOAL #7   Title Improve functional use of RUE as evidenced by performing 8 blocks on Box & Blocks test    Baseline 0    Time 4    Period Weeks    Status Achieved   04/20/20:  6 blocks, 05/05/20: 12 blocks            OT Long Term Goals - 06/13/20 1248      OT LONG TERM GOAL #1   Title Pt to be independent with updated HEP    Time 12    Period Weeks    Status On-going      OT LONG TERM GOAL #2   Title Pt to perform all BADLS at mod I level    Time 12    Period Weeks    Status On-going   min assist for LE dressing     OT LONG TERM GOAL #3   Title Pt to feed self 50% or greater with Rt dominant hand and grooming 25% or more with Rt hand    Time 12    Period Weeks    Status Revised      OT LONG TERM GOAL #4   Title Pt to retrieve light weight objects from high shelf RUE consistently    Time 12    Period Weeks    Status On-going      OT LONG TERM GOAL #5   Title Pt to demo 25 lbs grip strength or more Rt dominant hand for gripping,  opening jars/containers    Time 12    Period Weeks    Status On-going   12.5 lbs     OT LONG TERM GOAL #6   Title Pt to perform simple meal prep and light cleaning tasks mod I level     Time 12    Period Weeks    Status Deferred   deferred d/t inability to stand/ambulate without assist and AFO     OT LONG TERM GOAL #7   Title Improve functional use RUE as evidenced by performing 25 blocks on Box & Blocks    Time 12    Period Weeks    Status Revised   12 blocks     OT LONG TERM GOAL #8   Title Pt to returned to modified art/sculpting tasks with task modifications and A/E prn    Time 12    Period Weeks    Status Deferred                 Plan - 07/11/20 1039    Clinical Impression Statement Pt is progressing towards goals, however he remains limited by wrist tightness and spasticity.    Occupational performance deficits (Please refer to evaluation for details): ADL's;IADL's;Work;Leisure;Social Participation    Body Structure / Function / Physical Skills ADL;ROM;Dexterity;Balance;IADL;Body mechanics;Improper spinal/pelvic alignment;Sensation;Mobility;Flexibility;Strength;Coordination;FMC;Tone;UE functional use;Decreased knowledge of use of DME;Proprioception    Rehab Potential Good    Comorbidities Affecting Occupational Performance: May have comorbidities impacting occupational performance    OT Frequency 2x / week    OT Duration 8 weeks   additional 8 weeks beginning week of 06/20/20   OT Treatment/Interventions Self-care/ADL training;Therapeutic exercise;Functional Mobility Training;Neuromuscular education;Manual Therapy;Splinting;Aquatic Therapy;Manual lymph drainage;Therapeutic activities;Coping strategies training;DME and/or AE instruction;Electrical Stimulation;Fluidtherapy;Passive range of motion;Patient/family education    Plan continue NMR and functional use RUE    Consulted and Agree with Plan of Care Patient    Family Member Consulted brother           Patient will benefit from skilled therapeutic intervention in order to improve the following deficits and impairments:   Body Structure / Function / Physical Skills: ADL, ROM, Dexterity, Balance,  IADL, Body mechanics, Improper spinal/pelvic alignment, Sensation, Mobility, Flexibility, Strength, Coordination, FMC, Tone, UE functional use, Decreased knowledge of use of DME, Proprioception       Visit Diagnosis: Hemiplegia and hemiparesis following cerebral infarction affecting right dominant side (H[facility])  Other lack of coordination  Muscle weakness (generalized)    Problem List Patient Active Problem List   Diagnosis Date Noted  . Benign essential HTN 06/09/2020  . Abnormality of gait 04/28/2020  . Neurogenic bladder 03/17/2020  . Sepsis due to pneumonia ([place]) 03/06/2020  . History of stroke with residual deficit 03/06/2020  . Transient hypotension 03/06/2020  . Spastic hemiplegia affecting nondominant side ([place])   . History of hypertension   . Expressive aphasia   . AKI (acute kidney injury) ([place])   . Global aphasia   . Hypoalbuminemia due to protein-calorie malnutrition ([place])   . Dysphagia, post-stroke   . Hyperlipidemia 02/02/2020  . Dysphagia following cerebral infarction 02/02/2020  . Aspiration pneumonia ([place]) 02/02/2020  . Acute ischemic left MCA stroke ([place]) 02/02/2020  . Acute respiratory failure ([place])   . Acute ischemic stroke [facility]) s/p clot retrieval L MCA & ACA A3 01/27/2020  . Aspiration into airway 01/27/2020  . Chronic anticoagulation 01/27/2020  . Paroxysmal atrial fibrillation ([place]) 06/15/2018  . Essential hypertension 06/15/2018  . NICM (nonischemic cardiomyopathy) ([place]) 06/15/2018  .  Visit for monitoring Tikosyn therapy 06/12/2018    Carey Bullocks, OTR/L 07/11/2020, 10:39 AM  [facility] [address] [place], [place], [address] Phone: [phone]   Fax:  [phone]  Name: Dustin Schneider MRN: [id number] Date of Birth: 1956-05-07

## 2020-07-11 NOTE — Therapy (Addendum)
[facility] [address] [place], [place], [address] Phone: [phone]   Fax:  [phone]  Physical Therapy Treatment  Patient Details  Name: Dustin Schneider MRN: [id number] Date of Birth: 1956/07/10 Referring Provider (PT): Lauraine Rinne, PA-C (followed by Dr. Delice Lesch)   Encounter Date: 07/11/2020   PT End of Session - 07/11/20 1626    Visit Number 35    Number of Visits 48    Date for PT Re-Evaluation 09/01/20    Authorization Type BCBS    PT Start Time 0932    PT Stop Time 1012    PT Time Calculation (min) 40 min    Equipment Utilized During Treatment Gait belt    Activity Tolerance Patient tolerated treatment well    Behavior During Therapy [facility] for tasks assessed/performed           Past Medical History:  Diagnosis Date  . Hypertension   . NICM (nonischemic cardiomyopathy) ([place]) 06/15/2018   04/23/18: EF 45%, 09/15/18: NOrmal LVEF  . Paroxysmal atrial fibrillation ([place]) 06/15/2018  . Stroke ([place])   . Visit for monitoring Tikosyn therapy 06/12/2018    Past Surgical History:  Procedure Laterality Date  . BUBBLE STUDY  03/11/2020   Procedure: BUBBLE STUDY;  Surgeon: [name], MD;  Location: [facility];  Service: Cardiovascular;;  . CARDIOVERSION N/A 05/13/2018   Procedure: CARDIOVERSION;  Surgeon: [name], MD;  Location: [facility] ENDOSCOPY;  Service: Cardiovascular;  Laterality: N/A;  . CARDIOVERSION N/A 06/13/2018   Procedure: CARDIOVERSION;  Surgeon: [name], MD;  Location: [facility] ENDOSCOPY;  Service: Cardiovascular;  Laterality: N/A;  . CARDIOVERSION N/A 06/23/2018   Procedure: CARDIOVERSION;  Surgeon: [name], MD;  Location: [facility] ENDOSCOPY;  Service: Cardiovascular;  Laterality: N/A;  . CARDIOVERSION N/A 03/11/2020   Procedure: CARDIOVERSION;  Surgeon: [name], MD;  Location: [place];  Service: Cardiovascular;  Laterality: N/A;  . excision of actinic keratosis    . EYE SURGERY      eye lift  . IR CT HEAD LTD  01/27/2020  . IR PERCUTANEOUS ART THROMBECTOMY/INFUSION INTRACRANIAL INC DIAG ANGIO  01/27/2020      . IR PERCUTANEOUS ART THROMBECTOMY/INFUSION INTRACRANIAL INC DIAG ANGIO  01/27/2020  . RADIOLOGY WITH ANESTHESIA N/A 01/27/2020   Procedure: IR WITH ANESTHESIA;  Surgeon: Radiologist, Medication, MD;  Location: [place];  Service: Radiology;  Laterality: N/A;  . TEE WITHOUT CARDIOVERSION N/A 03/11/2020   Procedure: TRANSESOPHAGEAL ECHOCARDIOGRAM (TEE);  Surgeon: [name], MD;  Location: [place];  Service: Cardiovascular;  Laterality: N/A;  . TONSILLECTOMY    . WISDOM TOOTH EXTRACTION      There were no vitals filed for this visit.   Subjective Assessment - 07/11/20 0938    Subjective AFO is going well at home. Has not yet had the chance to pick up the RW yet - will be going today. Is with caregiver Roselyn Reef.    Patient is accompained by: --   caregiver Roselyn Reef   Pertinent History PMH: HTN, paroxysmal a fib    Limitations Standing;Walking    Patient Stated Goals "wants everything working on R side again"    Currently in Pain? No/denies                          07/11/20 0001  Transfers  Transfers Sit to Stand;Stand to Sit  Sit to Stand 5: Supervision  Stand to Sit 5: Supervision  Stand to Sit Details cues to  make sure RLE is in proper placement before sitting back down to w/c/chair during gait training   Squat Pivot Transfers 6: Modified independent (Device/Increase time)  Squat Pivot Transfer Details (indicate cue type and reason) from w/c > mat table  Ambulation/Gait  Ambulation/Gait Yes  Ambulation/Gait Assistance 4: Min guard  Ambulation/Gait Assistance Details Performed one bout of 345' at beginning of session - cues for upright posture and slowing gait down at times for improved foot clearance with pt demonstrating improvements in gait mechanics when slowing down and focusing on foot clearance. Pt with one instance of bumping RLE into RW  but with no LOB, pt continues with incr R hip ER.   Ambulation Distance (Feet) 345 Feet (x1, 15' x 7 reps )  Assistive device Rolling walker (R AFO, R hand orthosis)  Gait Pattern Step-through pattern;Decreased hip/knee flexion - right  Ambulation Surface Level;Indoor  Gait Comments gait training with caregiver Roselyn Reef for small distance gait at home: setting up 2 chairs approx. 15' apart (to simulate environment in pt's hallway) with chair positioned for pt to turn towards stronger LLE. Initially discussed with jamie proper placement for guarding and how to safely hold gait belt, PT performing 2 laps with pt first and caregiver Roselyn Reef performing x5 reps with pt with no issues and performing with min guard. Pt needing verbal cues to slow gait down for improved R foot clearance with pt demonstrating improved gait mechanics. No issues noted with both pt and caregiver feeling confident to perform at home. Discussed performing 3x a day during on times for AFO.   Knee/Hip Exercises: Standing  Other Standing Knee Exercises Performing at staircase with BUE support at 6" steps: RLE step ups with R foot (keeping R foot on step and just stepping up/down with LLE) 2 x 10 reps - cues for tall posture/glute activation when standing on step , step ups with LLE leading and working up stepping RLE on/off step x10 reps with pt able to perform with improved RLE foot clearance            PT Education - 07/11/20 1626    Education Details gait training for home with caregiver Roselyn Reef - see gait section for more details.    Person(s) Educated Patient;Caregiver(s)    Methods Explanation;Demonstration;Verbal cues    Comprehension Verbalized understanding;Returned demonstration            PT Short Term Goals - 07/01/20 1552      PT SHORT TERM GOAL #1   Title Pt will be mod I with all transfers with use of right AFO once he receives it for improved mobility.    Baseline Mod-I with all transfers 07/01/20    Time 4     Period Weeks    Status Achieved    Target Date 07/03/20      PT SHORT TERM GOAL #2   Title Pt will be able to ambulate > 345' with RW with right AFO and hand grip attachment min assist for improved mobility.    Baseline 345' with RW w/ hand grip and R AFO with min A    Time 4    Period Weeks    Status Achieved    Target Date 07/03/20      PT SHORT TERM GOAL #3   Title Pt will decrease 5 x sit to stand from 17.98 sec to <15 sec for improved balance and functional strength.    Baseline 06/03/20 17.98 sec from mat with hand; 16.4 s with  hands from mat 07/01/20    Time 4    Period Weeks    Status On-going    Target Date 07/31/20      PT SHORT TERM GOAL #4   Title Pt will be instructed in proper AFO wear when new brace received.    Time 4    Period Weeks    Status New    Target Date 07/31/20             PT Long Term Goals - 06/03/20 1057      PT LONG TERM GOAL #1   Title Pt and pt's family members will be independent with final HEP in order to build upon functional gains made in therapy. ALL LTGS DUE 09/01/20    Baseline Pt has been performing current HEP with family but PT continues to add to it 06/03/20    Time 12    Period Weeks    Status On-going    Target Date 09/01/20      PT LONG TERM GOAL #2   Title Pt will ambulate >500' on varied surfaces supervision with RW and R AFO for improved household mobility and short community distances.    Baseline 230' with RW with right hand grip attachment and R AFO min assist on 06/03/20    Time 12    Period Weeks    Status Revised    Target Date 09/01/20      PT LONG TERM GOAL #3   Title Pt will decrease TUG from 27 sec to <20 sec for improved balance and decreased fall risk.    Baseline 06/03/20 TUG=27 sec with RW    Time 12    Period Weeks    Status New    Target Date 09/01/20      PT LONG TERM GOAL #4   Title Pt will be able to ambulate up/down curb with RW and R AFO CGA for improved community access.    Time 12     Period Weeks    Status New    Target Date 09/01/20              07/13/20 0915  Plan  Clinical Impression Statement Focus of today's skilled session was gait training with new R AFO. [name] caregiver Roselyn Reef present throughout session today for gait training at home with RW (will be going to pick one up fro medical supply store today). Had 2 chairs set up approx. 15' apart (to simulate pt's wider hallway at home), pt's caregiver Roselyn Reef able to perform with pt using gait belt and min guard for safety, with pt demonstrating improved R foot clearance when slowing down with gait. Performed multiple reps with Roselyn Reef performing with no issues and pt/caregiver safe to perform at home. Will continue to progress towards LTGs.  Personal Factors and Comorbidities Comorbidity 3+  Comorbidities dense L MCA CVA, paroxysmal A. fib on chronic Xarelto, nonischemic cardiomyopathy and hypertension.  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, works as an Gaffer)  Pt will benefit from skilled therapeutic intervention in order to improve on the following deficits Abnormal gait;Decreased activity tolerance;Decreased balance;Decreased cognition;Decreased mobility;Decreased coordination;Decreased range of motion;Decreased endurance;Decreased strength;Hypomobility;Difficulty walking;Impaired UE functional use;Impaired vision/preception;Postural dysfunction;Impaired sensation  Stability/Clinical Decision Making Evolving/Moderate complexity  Rehab Potential Good  PT Frequency 2x / week  PT Duration 12 weeks  PT Treatment/Interventions ADLs/Self Care Home Management;Aquatic Therapy;DME Instruction;Gait training;Stair training;Functional mobility training;Therapeutic activities;Therapeutic exercise;Electrical Stimulation;Balance  training;Neuromuscular re-education;Wheelchair mobility  training;Orthotic Fit/Training;Patient/family education;Passive range of motion;Energy conservation  PT Next Visit Plan How is skin doing with AFO?  how is gait training going at home? continue gait with RW in clinic - obstacle negotiation. hip extensor strengthening RLE, continue step ups. weight shifting/NMR to RLE. try balance on compliant surfaces.  PT Home Exercise Plan on 06/28/20 - added standing at [place] and stepping LLE out and in  Consulted and Agree with Plan of Care Patient;Family member/caregiver  Family Member Consulted brother-in-law, [name]         Patient will benefit from skilled therapeutic intervention in order to improve the following deficits and impairments:     Visit Diagnosis: Other lack of coordination  Hemiplegia and hemiparesis following cerebral infarction affecting right dominant side (HCC)  Muscle weakness (generalized)  Other abnormalities of gait and mobility  Other symptoms and signs involving the nervous system     Problem List Patient Active Problem List   Diagnosis Date Noted  . Benign essential HTN 06/09/2020  . Abnormality of gait 04/28/2020  . Neurogenic bladder 03/17/2020  . Sepsis due to pneumonia ([place]) 03/06/2020  . History of stroke with residual deficit 03/06/2020  . Transient hypotension 03/06/2020  . Spastic hemiplegia affecting nondominant side ([place])   . History of hypertension   . Expressive aphasia   . AKI (acute kidney injury) ([place])   . Global aphasia   . Hypoalbuminemia due to protein-calorie malnutrition ([place])   . Dysphagia, post-stroke   . Hyperlipidemia 02/02/2020  . Dysphagia following cerebral infarction 02/02/2020  . Aspiration pneumonia ([place]) 02/02/2020  . Acute ischemic left MCA stroke ([place]) 02/02/2020  . Acute respiratory failure ([place])   . Acute ischemic stroke [facility]) s/p clot retrieval L MCA & ACA A3 01/27/2020  . Aspiration into airway 01/27/2020    . Chronic anticoagulation 01/27/2020  . Paroxysmal atrial fibrillation ([place]) 06/15/2018  . Essential hypertension 06/15/2018  . NICM (nonischemic cardiomyopathy) ([place]) 06/15/2018  . Visit for monitoring Tikosyn therapy 06/12/2018    Arliss Journey, PT, DPT  07/11/2020, 4:27 PM  [facility] [address] [place], [place], [address] Phone: [phone]   Fax:  [phone]  Name: OLUWANIFEMI SUSMAN MRN: [id number] Date of Birth: [birth year]

## 2020-07-14 ENCOUNTER — Encounter: Payer: Self-pay | Admitting: Physical Therapy

## 2020-07-14 ENCOUNTER — Ambulatory Visit: Payer: BC Managed Care – PPO | Attending: Physician Assistant | Admitting: Physical Therapy

## 2020-07-14 ENCOUNTER — Other Ambulatory Visit: Payer: Self-pay

## 2020-07-14 ENCOUNTER — Ambulatory Visit: Payer: BC Managed Care – PPO | Admitting: Occupational Therapy

## 2020-07-14 DIAGNOSIS — R482 Apraxia: Secondary | ICD-10-CM | POA: Diagnosis present

## 2020-07-14 DIAGNOSIS — R278 Other lack of coordination: Secondary | ICD-10-CM | POA: Insufficient documentation

## 2020-07-14 DIAGNOSIS — R2681 Unsteadiness on feet: Secondary | ICD-10-CM | POA: Diagnosis present

## 2020-07-14 DIAGNOSIS — I69351 Hemiplegia and hemiparesis following cerebral infarction affecting right dominant side: Secondary | ICD-10-CM | POA: Diagnosis present

## 2020-07-14 DIAGNOSIS — R262 Difficulty in walking, not elsewhere classified: Secondary | ICD-10-CM | POA: Insufficient documentation

## 2020-07-14 DIAGNOSIS — M6281 Muscle weakness (generalized): Secondary | ICD-10-CM | POA: Insufficient documentation

## 2020-07-14 DIAGNOSIS — R4701 Aphasia: Secondary | ICD-10-CM | POA: Diagnosis present

## 2020-07-14 DIAGNOSIS — R29818 Other symptoms and signs involving the nervous system: Secondary | ICD-10-CM | POA: Diagnosis present

## 2020-07-14 DIAGNOSIS — R2689 Other abnormalities of gait and mobility: Secondary | ICD-10-CM | POA: Diagnosis present

## 2020-07-14 NOTE — Patient Instructions (Signed)
1. Flexion (Assistive)    Laying down. Hold Rt wrist (thumb side up) and raise arms above head, keeping elbows as straight as possible.  Repeat __20__ times. Do _2___ sessions per day.  2. ELBOW: Extension / Chest Press (Frame)    Lie on back with knees bent. Straighten elbows to raise frame. Hold _3__ seconds.  _10__ reps per set, _2__ sets per day  3. Sit up tall - hold pool noodle in both hands and reach towards floor x 10, 2x/day.  Do same ex, but lift up to chest level from floor with arms straight, then pinch shoulder blades together bending arms back to chest x 10 reps.   4. Place checkers into Connect 4 slots Rt hand (4 rows) 1x/day

## 2020-07-14 NOTE — Therapy (Signed)
[facility] [address] [place], [place], [address] Phone: [phone]   Fax:  [phone]  Occupational Therapy Treatment  Patient Details  Name: Dustin Schneider MRN: [id number] Date of Birth: 10/16/1955 No data recorded  Encounter Date: 07/14/2020   OT End of Session - 07/14/20 1112    Visit Number 32    Number of Visits 41    Date for OT Re-Evaluation 08/19/20    Authorization Type BC/BS - No auth required, covered 100%    Authorization Time Period week 4/8 for renewal period    OT Start Time 0930    OT Stop Time 1015    OT Time Calculation (min) 45 min    Activity Tolerance Patient tolerated treatment well    Behavior During Therapy [facility] for tasks assessed/performed           Past Medical History:  Diagnosis Date  . Hypertension   . NICM (nonischemic cardiomyopathy) ([place]) 06/15/2018   04/23/18: EF 45%, 09/15/18: NOrmal LVEF  . Paroxysmal atrial fibrillation ([place]) 06/15/2018  . Stroke ([place])   . Visit for monitoring Tikosyn therapy 06/12/2018    Past Surgical History:  Procedure Laterality Date  . BUBBLE STUDY  03/11/2020   Procedure: BUBBLE STUDY;  Surgeon: [name], MD;  Location: [place];  Service: Cardiovascular;;  . CARDIOVERSION N/A 05/13/2018   Procedure: CARDIOVERSION;  Surgeon: [name], MD;  Location: [facility] ENDOSCOPY;  Service: Cardiovascular;  Laterality: N/A;  . CARDIOVERSION N/A 06/13/2018   Procedure: CARDIOVERSION;  Surgeon: [name], MD;  Location: [facility] ENDOSCOPY;  Service: Cardiovascular;  Laterality: N/A;  . CARDIOVERSION N/A 06/23/2018   Procedure: CARDIOVERSION;  Surgeon: [name], MD;  Location: [facility] ENDOSCOPY;  Service: Cardiovascular;  Laterality: N/A;  . CARDIOVERSION N/A 03/11/2020   Procedure: CARDIOVERSION;  Surgeon: [name], MD;  Location: [place];  Service: Cardiovascular;  Laterality: N/A;  . excision of actinic keratosis    . EYE SURGERY     eye lift  . IR CT  HEAD LTD  01/27/2020  . IR PERCUTANEOUS ART THROMBECTOMY/INFUSION INTRACRANIAL INC DIAG ANGIO  01/27/2020      . IR PERCUTANEOUS ART THROMBECTOMY/INFUSION INTRACRANIAL INC DIAG ANGIO  01/27/2020  . RADIOLOGY WITH ANESTHESIA N/A 01/27/2020   Procedure: IR WITH ANESTHESIA;  Surgeon: Radiologist, Medication, MD;  Location: [place];  Service: Radiology;  Laterality: N/A;  . TEE WITHOUT CARDIOVERSION N/A 03/11/2020   Procedure: TRANSESOPHAGEAL ECHOCARDIOGRAM (TEE);  Surgeon: [name], MD;  Location: [place];  Service: Cardiovascular;  Laterality: N/A;  . TONSILLECTOMY    . WISDOM TOOTH EXTRACTION      There were no vitals filed for this visit.   Subjective Assessment - 07/14/20 0935    Subjective  Pt reports no pain    Patient is accompanied by: Family member    Pertinent History Lt MCA CVA 01/27/20, readmitted to hospital with HAP and fever on 03/06/20 and released 03/11/20. PMH: paroxsymal A-fib, HTN    Limitations fall    Currently in Pain? No/denies           Supine: Reviewed proper technique for self stretch in sh flexion and pt performed x 20 with cues to move slowly and through full tolerable motion. Pt was able to achieve 10 - 15 degrees higher after initial 10 reps. Chest press x 10 using dowel with cues to look at Rt hand, otherwise hand would slip off.  Seated: BUE AA/ROM in low level, then low to mid  level using pool noodle and cues to perform scapula retraction to bring noodle back to chest and to prevent shoulder hiking.  Issued all above as updated HEP for shoulder and then reprinted functional activities for pt to do with Rt hand/UE from September 2021.   NMES x 15 min. To wrist and finger extensors for composite extension and increased stretch to wrist - 50 pps, 250 pw, 10 sec on/off cycle, ramp up/down 2. While pt on estim, therapist simultaneously got above HEP consolidated/ready for pt, and then also began advising on what NMES devices to purchase for home (as pt was  interested in using at home). [name] brother present for all education and verbalized understanding.                      OT Education - 07/14/20 1010    Education Details Updated/consilidated HEP (in prep for d/c next month)    Person(s) Educated Patient;Caregiver(s)    Methods Explanation;Demonstration;Verbal cues;Handout    Comprehension Verbalized understanding            OT Short Term Goals - 06/13/20 1245      OT SHORT TERM GOAL #1   Title Independent with initial HEP    Time 4    Period Weeks    Status Achieved      OT SHORT TERM GOAL #2   Title Pt to perform low to mid level reaching of light objects RUE in prep for feeding self and bathing    Time 4    Period Weeks    Status Partially Met   low level, inconsistent mid level     OT SHORT TERM GOAL #3   Title Pt to feed self 50% of the time with Rt dominant hand and A/E prn    Time 4    Period Weeks    Status On-going   06/13/20: Pt reports eating yogurt/breakfast and some finger foods, but using Lt hand for lunch/dinner)     OT SHORT TERM GOAL #4   Title Pt to perform UE dressing of pullover shirt mod I level    Time 4    Period Weeks    Status Achieved   met per pt and brother     OT SHORT TERM GOAL #5   Title Pt to perform LE dressing and all bathing with no more than mod assist using A/E and strategies prn    Time 4    Period Weeks    Status Achieved   04/20/20:  set-up for bedside bath, mod A for LE dressing     OT SHORT TERM GOAL #6   Title Pt to perform toileting and toilet transfers with no more than min assist overall    Time 4    Period Weeks    Status Achieved   supervision     OT SHORT TERM GOAL #7   Title Improve functional use of RUE as evidenced by performing 8 blocks on Box & Blocks test    Baseline 0    Time 4    Period Weeks    Status Achieved   04/20/20:  6 blocks, 05/05/20: 12 blocks            OT Long Term Goals - 06/13/20 1248      OT LONG TERM GOAL #1   Title Pt  to be independent with updated HEP    Time 12    Period Weeks    Status On-going  OT LONG TERM GOAL #2   Title Pt to perform all BADLS at mod I level    Time 12    Period Weeks    Status On-going   min assist for LE dressing     OT LONG TERM GOAL #3   Title Pt to feed self 50% or greater with Rt dominant hand and grooming 25% or more with Rt hand    Time 12    Period Weeks    Status Revised      OT LONG TERM GOAL #4   Title Pt to retrieve light weight objects from high shelf RUE consistently    Time 12    Period Weeks    Status On-going      OT LONG TERM GOAL #5   Title Pt to demo 25 lbs grip strength or more Rt dominant hand for gripping, opening jars/containers    Time 12    Period Weeks    Status On-going   12.5 lbs     OT LONG TERM GOAL #6   Title Pt to perform simple meal prep and light cleaning tasks mod I level    Time 12    Period Weeks    Status Deferred   deferred d/t inability to stand/ambulate without assist and AFO     OT LONG TERM GOAL #7   Title Improve functional use RUE as evidenced by performing 25 blocks on Box & Blocks    Time 12    Period Weeks    Status Revised   12 blocks     OT LONG TERM GOAL #8   Title Pt to returned to modified art/sculpting tasks with task modifications and A/E prn    Time 12    Period Weeks    Status Deferred                 Plan - 07/14/20 1113    Clinical Impression Statement Pt is progressing towards goals, however he remains limited by wrist tightness and spasticity. Pt reaching plateau in progress but will continue 2-3 more weeks to reinforce HEP's for home, forced use of Rt hand/UE for safe activities, and potentially train in estim unit for home use    Occupational performance deficits (Please refer to evaluation for details): ADL's;IADL's;Work;Leisure;Social Participation    Body Structure / Function / Physical Skills ADL;ROM;Dexterity;Balance;IADL;Body mechanics;Improper spinal/pelvic  alignment;Sensation;Mobility;Flexibility;Strength;Coordination;FMC;Tone;UE functional use;Decreased knowledge of use of DME;Proprioception    Rehab Potential Good    Comorbidities Affecting Occupational Performance: May have comorbidities impacting occupational performance    OT Frequency 2x / week    OT Duration 8 weeks   additional 8 weeks beginning week of 06/20/20   OT Treatment/Interventions Self-care/ADL training;Therapeutic exercise;Functional Mobility Training;Neuromuscular education;Manual Therapy;Splinting;Aquatic Therapy;Manual lymph drainage;Therapeutic activities;Coping strategies training;DME and/or AE instruction;Electrical Stimulation;Fluidtherapy;Passive range of motion;Patient/family education    Plan follow up on potential purchase of estim unit?, continue forced use of RUE    Consulted and Agree with Plan of Care Patient    Family Member Consulted brother           Patient will benefit from skilled therapeutic intervention in order to improve the following deficits and impairments:   Body Structure / Function / Physical Skills: ADL, ROM, Dexterity, Balance, IADL, Body mechanics, Improper spinal/pelvic alignment, Sensation, Mobility, Flexibility, Strength, Coordination, FMC, Tone, UE functional use, Decreased knowledge of use of DME, Proprioception       Visit Diagnosis: Hemiplegia and hemiparesis following cerebral infarction affecting right dominant side (  [place])  Muscle weakness (generalized)  Other lack of coordination    Problem List Patient Active Problem List   Diagnosis Date Noted  . Benign essential HTN 06/09/2020  . Abnormality of gait 04/28/2020  . Neurogenic bladder 03/17/2020  . Sepsis due to pneumonia ([place]) 03/06/2020  . History of stroke with residual deficit 03/06/2020  . Transient hypotension 03/06/2020  . Spastic hemiplegia affecting nondominant side ([place])   . History of hypertension   . Expressive aphasia   . AKI (acute kidney injury) ([place])     . Global aphasia   . Hypoalbuminemia due to protein-calorie malnutrition ([place])   . Dysphagia, post-stroke   . Hyperlipidemia 02/02/2020  . Dysphagia following cerebral infarction 02/02/2020  . Aspiration pneumonia ([place]) 02/02/2020  . Acute ischemic left MCA stroke ([place]) 02/02/2020  . Acute respiratory failure ([place])   . Acute ischemic stroke [facility]) s/p clot retrieval L MCA & ACA A3 01/27/2020  . Aspiration into airway 01/27/2020  . Chronic anticoagulation 01/27/2020  . Paroxysmal atrial fibrillation ([place]) 06/15/2018  . Essential hypertension 06/15/2018  . NICM (nonischemic cardiomyopathy) ([place]) 06/15/2018  . Visit for monitoring Tikosyn therapy 06/12/2018    Carey Bullocks, OTR/L 07/14/2020, 11:15 AM  [facility] [address] [place], [place], [address] Phone: [phone]   Fax:  [phone]  Name: ARAFAT COCUZZA MRN: [id number] Date of Birth: 1956-03-21

## 2020-07-15 NOTE — Therapy (Signed)
[facility] [address] [place], [place], [address] Phone: [phone]   Fax:  [phone]  Physical Therapy Treatment  Patient Details  Name: Dustin Schneider MRN: [id number] Date of Birth: [birth year] Referring Provider (PT): Lauraine Rinne, PA-C (followed by Dr. Delice Lesch)   Encounter Date: 07/14/2020   PT End of Session - 07/14/20 0853    Visit Number 36    Number of Visits 48    Date for PT Re-Evaluation 09/01/20    Authorization Type BCBS    PT Start Time [phone]    PT Stop Time 0930    PT Time Calculation (min) 41 min    Equipment Utilized During Treatment Gait belt    Activity Tolerance Patient tolerated treatment well    Behavior During Therapy [facility] for tasks assessed/performed           Past Medical History:  Diagnosis Date  . Hypertension   . NICM (nonischemic cardiomyopathy) ([place]) 06/15/2018   04/23/18: EF 45%, 09/15/18: NOrmal LVEF  . Paroxysmal atrial fibrillation ([place]) 06/15/2018  . Stroke ([place])   . Visit for monitoring Tikosyn therapy 06/12/2018    Past Surgical History:  Procedure Laterality Date  . BUBBLE STUDY  03/11/2020   Procedure: BUBBLE STUDY;  Surgeon: [name], MD;  Location: [place];  Service: Cardiovascular;;  . CARDIOVERSION N/A 05/13/2018   Procedure: CARDIOVERSION;  Surgeon: [name], MD;  Location: [facility] ENDOSCOPY;  Service: Cardiovascular;  Laterality: N/A;  . CARDIOVERSION N/A 06/13/2018   Procedure: CARDIOVERSION;  Surgeon: [name], MD;  Location: [facility] ENDOSCOPY;  Service: Cardiovascular;  Laterality: N/A;  . CARDIOVERSION N/A 06/23/2018   Procedure: CARDIOVERSION;  Surgeon: [name], MD;  Location: [facility] ENDOSCOPY;  Service: Cardiovascular;  Laterality: N/A;  . CARDIOVERSION N/A 03/11/2020   Procedure: CARDIOVERSION;  Surgeon: [name], MD;  Location: [place];  Service: Cardiovascular;  Laterality: N/A;  . excision of actinic keratosis    . EYE SURGERY     eye  lift  . IR CT HEAD LTD  01/27/2020  . IR PERCUTANEOUS ART THROMBECTOMY/INFUSION INTRACRANIAL INC DIAG ANGIO  01/27/2020      . IR PERCUTANEOUS ART THROMBECTOMY/INFUSION INTRACRANIAL INC DIAG ANGIO  01/27/2020  . RADIOLOGY WITH ANESTHESIA N/A 01/27/2020   Procedure: IR WITH ANESTHESIA;  Surgeon: Radiologist, Medication, MD;  Location: [place];  Service: Radiology;  Laterality: N/A;  . TEE WITHOUT CARDIOVERSION N/A 03/11/2020   Procedure: TRANSESOPHAGEAL ECHOCARDIOGRAM (TEE);  Surgeon: [name], MD;  Location: [place];  Service: Cardiovascular;  Laterality: N/A;  . TONSILLECTOMY    . WISDOM TOOTH EXTRACTION      There were no vitals filed for this visit.   Subjective Assessment - 07/14/20 0850    Subjective No new complaints. No falls or pain to report. Wearing schedule has varied depending on who is with him. Wore it all day yesterday. Got the walker Monday. Has been walking short distances at home and on ramp outside.    Pertinent History PMH: HTN, paroxysmal a fib    Limitations Standing;Walking    Patient Stated Goals "wants everything working on R side again"    Currently in Pain? No/denies    Pain Score 0-No pain                 [facility] Adult PT Treatment/Exercise - 07/14/20 0854      Transfers   Transfers Sit to Stand;Stand to Sit    Sit to Stand 5: Supervision;With upper extremity  assist;From chair/3-in-1    Stand to Sit 5: Supervision;With upper extremity assist;To chair/3-in-1      Ambulation/Gait   Ambulation/Gait Yes    Ambulation/Gait Assistance 4: Min guard;4: Min assist    Ambulation/Gait Assistance Details cues to slow down and focus on step placement, especially with turns.     Ambulation Distance (Feet) 230 Feet   x1,    Assistive device Rolling walker    Gait Pattern Step-through pattern;Decreased hip/knee flexion - right    Ambulation Surface Level;Indoor      High Level Balance   High Level Balance Activities Negotitating around obstacles    High  Level Balance Comments with RW weaving around 3 stools with 180 degree turns at each end for 3 laps working on walker positioning and step placement within the walker. this improves as pt slows down with cues. up to min assist needed for balance.       Neuro Re-ed    Neuro Re-ed Details  for balance/muscle re-ed: in parallel bars on airex- feet hip width apart for EC 30 sec's x 3 reps, then with feet wider apart for EC head movements left<>right, up<>down with up to min assist for balance.; then on blue mat in parallel bars- side stepping , then forward/backward marching for 3-4 laps each with min asisst. UE support on bars with cues on correct ex form and technique with all balance ex's performed.                  PT Short Term Goals - 07/01/20 1552      PT SHORT TERM GOAL #1   Title Pt will be mod I with all transfers with use of right AFO once he receives it for improved mobility.    Baseline Mod-I with all transfers 07/01/20    Time 4    Period Weeks    Status Achieved    Target Date 07/03/20      PT SHORT TERM GOAL #2   Title Pt will be able to ambulate > 345' with RW with right AFO and hand grip attachment min assist for improved mobility.    Baseline 345' with RW w/ hand grip and R AFO with min A    Time 4    Period Weeks    Status Achieved    Target Date 07/03/20      PT SHORT TERM GOAL #3   Title Pt will decrease 5 x sit to stand from 17.98 sec to <15 sec for improved balance and functional strength.    Baseline 06/03/20 17.98 sec from mat with hand; 16.4 s with hands from mat 07/01/20    Time 4    Period Weeks    Status On-going    Target Date 07/31/20      PT SHORT TERM GOAL #4   Title Pt will be instructed in proper AFO wear when new brace received.    Time 4    Period Weeks    Status New    Target Date 07/31/20             PT Long Term Goals - 06/03/20 1057      PT LONG TERM GOAL #1   Title Pt and pt's family members will be independent with final  HEP in order to build upon functional gains made in therapy. ALL LTGS DUE 09/01/20    Baseline Pt has been performing current HEP with family but PT continues to add to it 06/03/20  Time 12    Period Weeks    Status On-going    Target Date 09/01/20      PT LONG TERM GOAL #2   Title Pt will ambulate >500' on varied surfaces supervision with RW and R AFO for improved household mobility and short community distances.    Baseline 230' with RW with right hand grip attachment and R AFO min assist on 06/03/20    Time 12    Period Weeks    Status Revised    Target Date 09/01/20      PT LONG TERM GOAL #3   Title Pt will decrease TUG from 27 sec to <20 sec for improved balance and decreased fall risk.    Baseline 06/03/20 TUG=27 sec with RW    Time 12    Period Weeks    Status New    Target Date 09/01/20      PT LONG TERM GOAL #4   Title Pt will be able to ambulate up/down curb with RW and R AFO CGA for improved community access.    Time 12    Period Weeks    Status New    Target Date 09/01/20                 Plan - 07/14/20 0853    Clinical Impression Statement Today's skilled session continued to focus on gait with brace/walker and balance training. No issues reported or noted in session. The pt does continue to need cues to slow down with mobility for improved balance and form. The pt is progressing toward goals and should benefit from continued PT to progress toward unmet goals.    Personal Factors and Comorbidities Comorbidity 3+    Comorbidities dense L MCA CVA, paroxysmal A. fib on chronic Xarelto, nonischemic cardiomyopathy and hypertension.    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, works as an Electrical engineer Evolving/Moderate complexity    Rehab  Potential Good    PT Frequency 2x / week    PT Duration 12 weeks    PT Treatment/Interventions ADLs/Self Care Home Management;Aquatic Therapy;DME Instruction;Gait training;Stair training;Functional mobility training;Therapeutic activities;Therapeutic exercise;Electrical Stimulation;Balance training;Neuromuscular re-education;Wheelchair mobility training;Orthotic Fit/Training;Patient/family education;Passive range of motion;Energy conservation    PT Next Visit Plan continue gait with RW in clinic - obstacle negotiation. hip extensor strengthening RLE, continue step ups. weight shifting/NMR to RLE. continue with  balance on compliant surfaces.    PT Home Exercise Plan on 06/28/20 - added standing at [place] and stepping LLE out and in    Consulted and Agree with Plan of Care Patient;Family member/caregiver    Family Member Consulted brother-in-law, [name]           Patient will benefit from skilled therapeutic intervention in order to improve the following deficits and impairments:  Abnormal gait, Decreased activity tolerance, Decreased balance, Decreased cognition, Decreased mobility, Decreased coordination, Decreased range of motion, Decreased endurance, Decreased strength, Hypomobility, Difficulty walking, Impaired UE functional use, Impaired vision/preception, Postural dysfunction, Impaired sensation  Visit Diagnosis: Hemiplegia and hemiparesis following cerebral infarction affecting right dominant side (HCC)  Muscle weakness (generalized)  Other abnormalities of gait and mobility     Problem List Patient Active Problem List   Diagnosis Date Noted  . Benign essential HTN 06/09/2020  . Abnormality of gait 04/28/2020  . Neurogenic bladder 03/17/2020  . Sepsis due to pneumonia ([place]) 03/06/2020  . History of stroke with  residual deficit 03/06/2020  . Transient hypotension 03/06/2020  . Spastic hemiplegia affecting nondominant side ([place])   . History of hypertension   . Expressive  aphasia   . AKI (acute kidney injury) ([place])   . Global aphasia   . Hypoalbuminemia due to protein-calorie malnutrition ([place])   . Dysphagia, post-stroke   . Hyperlipidemia 02/02/2020  . Dysphagia following cerebral infarction 02/02/2020  . Aspiration pneumonia ([place]) 02/02/2020  . Acute ischemic left MCA stroke ([place]) 02/02/2020  . Acute respiratory failure ([place])   . Acute ischemic stroke [facility]) s/p clot retrieval L MCA & ACA A3 01/27/2020  . Aspiration into airway 01/27/2020  . Chronic anticoagulation 01/27/2020  . Paroxysmal atrial fibrillation ([place]) 06/15/2018  . Essential hypertension 06/15/2018  . NICM (nonischemic cardiomyopathy) ([place]) 06/15/2018  . Visit for monitoring Tikosyn therapy 06/12/2018    Willow Ora, PTA, [facility] [address], [place], [place] [address] [phone] 07/15/20, 2:46 PM   Name: SABINO DENNING MRN: [id number] Date of Birth: 07/31/1956

## 2020-07-18 ENCOUNTER — Ambulatory Visit: Payer: BC Managed Care – PPO | Admitting: Occupational Therapy

## 2020-07-18 ENCOUNTER — Ambulatory Visit: Payer: BC Managed Care – PPO | Admitting: Physical Therapy

## 2020-07-18 ENCOUNTER — Encounter: Payer: Self-pay | Admitting: Speech Pathology

## 2020-07-18 ENCOUNTER — Ambulatory Visit: Payer: BC Managed Care – PPO | Admitting: Speech Pathology

## 2020-07-18 ENCOUNTER — Other Ambulatory Visit: Payer: Self-pay

## 2020-07-18 ENCOUNTER — Encounter: Payer: Self-pay | Admitting: Physical Therapy

## 2020-07-18 VITALS — BP 136/81 | HR 62

## 2020-07-18 DIAGNOSIS — R2689 Other abnormalities of gait and mobility: Secondary | ICD-10-CM

## 2020-07-18 DIAGNOSIS — I69351 Hemiplegia and hemiparesis following cerebral infarction affecting right dominant side: Secondary | ICD-10-CM

## 2020-07-18 DIAGNOSIS — M6281 Muscle weakness (generalized): Secondary | ICD-10-CM

## 2020-07-18 DIAGNOSIS — R278 Other lack of coordination: Secondary | ICD-10-CM

## 2020-07-18 DIAGNOSIS — R482 Apraxia: Secondary | ICD-10-CM

## 2020-07-18 DIAGNOSIS — R4701 Aphasia: Secondary | ICD-10-CM

## 2020-07-18 DIAGNOSIS — R29818 Other symptoms and signs involving the nervous system: Secondary | ICD-10-CM

## 2020-07-18 NOTE — Therapy (Signed)
[facility] [address] [place], [place], [address] Phone: [phone]   Fax:  [phone]  Speech Language Pathology Treatment  Patient Details  Name: Dustin Schneider MRN: [id number] Date of Birth: [birth year] Referring Provider (SLP): Dr. Delice Lesch   Encounter Date: 07/18/2020   End of Session - 07/18/20 1218    Visit Number 22    Number of Visits 25    Date for SLP Re-Evaluation 08/01/20   extended as pt on hold for device   SLP Start Time 1017    SLP Stop Time  1102    SLP Time Calculation (min) 45 min    Activity Tolerance Patient tolerated treatment well           Past Medical History:  Diagnosis Date  . Hypertension   . NICM (nonischemic cardiomyopathy) ([place]) 06/15/2018   04/23/18: EF 45%, 09/15/18: NOrmal LVEF  . Paroxysmal atrial fibrillation ([place]) 06/15/2018  . Stroke ([place])   . Visit for monitoring Tikosyn therapy 06/12/2018    Past Surgical History:  Procedure Laterality Date  . BUBBLE STUDY  03/11/2020   Procedure: BUBBLE STUDY;  Surgeon: [name], MD;  Location: [place];  Service: Cardiovascular;;  . CARDIOVERSION N/A 05/13/2018   Procedure: CARDIOVERSION;  Surgeon: [name], MD;  Location: [facility] ENDOSCOPY;  Service: Cardiovascular;  Laterality: N/A;  . CARDIOVERSION N/A 06/13/2018   Procedure: CARDIOVERSION;  Surgeon: [name], MD;  Location: [facility] ENDOSCOPY;  Service: Cardiovascular;  Laterality: N/A;  . CARDIOVERSION N/A 06/23/2018   Procedure: CARDIOVERSION;  Surgeon: [name], MD;  Location: [facility] ENDOSCOPY;  Service: Cardiovascular;  Laterality: N/A;  . CARDIOVERSION N/A 03/11/2020   Procedure: CARDIOVERSION;  Surgeon: [name], MD;  Location: [place];  Service: Cardiovascular;  Laterality: N/A;  . excision of actinic keratosis    . EYE SURGERY     eye lift  . IR CT HEAD LTD  01/27/2020  . IR PERCUTANEOUS ART THROMBECTOMY/INFUSION INTRACRANIAL INC DIAG ANGIO  01/27/2020       . IR PERCUTANEOUS ART THROMBECTOMY/INFUSION INTRACRANIAL INC DIAG ANGIO  01/27/2020  . RADIOLOGY WITH ANESTHESIA N/A 01/27/2020   Procedure: IR WITH ANESTHESIA;  Surgeon: Radiologist, Medication, MD;  Location: [place];  Service: Radiology;  Laterality: N/A;  . TEE WITHOUT CARDIOVERSION N/A 03/11/2020   Procedure: TRANSESOPHAGEAL ECHOCARDIOGRAM (TEE);  Surgeon: [name], MD;  Location: [place];  Service: Cardiovascular;  Laterality: N/A;  . TONSILLECTOMY    . WISDOM TOOTH EXTRACTION      There were no vitals filed for this visit.   Subjective Assessment - 07/18/20 1023    Subjective "We go this device in the mail"    Patient is accompained by: Family member   s/o Dustin Schneider   Currently in Pain? No/denies                 ADULT SLP TREATMENT - 07/18/20 1042      General Information   Behavior/Cognition Alert;Cooperative;Pleasant mood      Treatment Provided   Treatment provided Cognitive-Linquistic      Cognitive-Linquistic Treatment   Treatment focused on Aphasia;Apraxia;Patient/family/caregiver education    Skilled Treatment Dustin Schneider with new loaner device, unsure of his device backup name. Dustin Schneider verbalized frustration with device and that Dustin Schneider isn't practicing talking consistently. Educated that with the device, Dustin Schneider should be repeating the words after the device says them, and dong therapy activities on the device saying the words aloud. Demonstrated AAC click app on phone, for more  simple items. We generated 3 topcis that Dustin Schneider could practice talking about with his breakfast group, including holiday plans, the friends' jobs. Dustin Schneider and Dustin Schneider or Dustin Schneider are to generate and practice prior to [employer], as well as write down sentneces/topcis in miniinote book for Dustin Schneider to refer to. Also modeled role playing phrases, name, DOB for picking up meds from pharmacy. Dustin Schneider to practice before hand and attempt communcition with pharmacist or store clerks, having family there for back up/support.  Provided aphasia ID cards.       Assessment / Recommendations / Plan   Plan Continue with current plan of care      Progression Toward Goals   Progression toward goals Progressing toward goals            SLP Education - 07/18/20 1216    Education Details Generate phrases/sentences/topcis you can practice before going to breakfast or a store - have these written in an mininotebook to refer to as needed    Person(s) Educated Patient;Caregiver(s)   s/o Dustin Schneider   Methods Explanation;Demonstration;Verbal cues;Handout    Comprehension Returned demonstration;Verbal cues required;Need further instruction            SLP Short Term Goals - 07/18/20 1218      SLP SHORT TERM GOAL #1   Title Pt will name family, friends and art items with occasional min A 8/10 with approximations allowed.    Time 1    Period Weeks    Status Partially Met      SLP SHORT TERM GOAL #2   Title Pt will name 7 items in personally relevant categories with occasional min A over 2 sessions    Time 1    Period Weeks    Status Partially Met      SLP SHORT TERM GOAL #3   Title Pt will perform 4 automatic speech tasks with rare min A.    Time 1    Period Weeks    Status Partially Met      SLP SHORT TERM GOAL #4   Title Pt will write personal information (name/address/phone), 5 family/friend/pet names and 7  professional words with occasional min A    Time 1    Period Weeks    Status Not Met      SLP SHORT TERM GOAL #5   Title Pt will use mulitmodal communication (gesture, draw, write 1st letter etc) to augment verbal expression to meet needs at home with rare min A from family.    Time 1    Period Weeks    Status Partially Met            SLP Long Term Goals - 07/18/20 1218      SLP LONG TERM GOAL #1   Title Pt will verbalize 3 words to explain or describe his artwork and hobbies with occasional min A over 3 sessions, allowing for intellgible approximations    Baseline 05-17-20 (Lingraphica cues),  05-24-20 (lingraphica), 05-31-20 (lingraphica)    Status Achieved      SLP LONG TERM GOAL #2   Title Pt and family will utilize multimodal compensations for aphasia and verbal apraxia to participate in 3 turns each of conversation with occasional min A over 2 sessions    Time 1    Period Weeks    Status On-going      SLP LONG TERM GOAL #3   Title Pt will write 3 word phrases with less than 2 errors with rare min A over  3 sessions    Status Not Met      SLP LONG TERM GOAL #4   Title Reading assessment if indicated    Status Deferred            Plan - 07/18/20 1217    Clinical Impression Statement moderate Broca's aphasia and severe verbal apraxia persist, affecting communication of basic wants, needs and social interactions. Ongoing training in multimodal communication - Dustin Schneider") cont to require less cuing for procedures for basic programming. Continue skilled ST to maximize communication for QOL, safety and reduce family burden.    Speech Therapy Frequency 2x / week    Duration 12 weeks   25 visits   Treatment/Interventions Environmental controls;Cueing hierarchy;SLP instruction and feedback;Compensatory strategies;Functional tasks;Cognitive reorganization;Compensatory techniques;Internal/external aids;Multimodal communcation approach;Patient/family education;Language facilitation    Potential to Achieve Goals Good    Potential Considerations Severity of impairments           Patient will benefit from skilled therapeutic intervention in order to improve the following deficits and impairments:   Aphasia  Verbal apraxia    Problem List Patient Active Problem List   Diagnosis Date Noted  . Benign essential HTN 06/09/2020  . Abnormality of gait 04/28/2020  . Neurogenic bladder 03/17/2020  . Sepsis due to pneumonia ([place]) 03/06/2020  . History of stroke with residual deficit 03/06/2020  . Transient hypotension 03/06/2020  . Spastic hemiplegia affecting nondominant side  ([place])   . History of hypertension   . Expressive aphasia   . AKI (acute kidney injury) ([place])   . Global aphasia   . Hypoalbuminemia due to protein-calorie malnutrition ([place])   . Dysphagia, post-stroke   . Hyperlipidemia 02/02/2020  . Dysphagia following cerebral infarction 02/02/2020  . Aspiration pneumonia ([place]) 02/02/2020  . Acute ischemic left MCA stroke ([place]) 02/02/2020  . Acute respiratory failure ([place])   . Acute ischemic stroke [facility]) s/p clot retrieval L MCA & ACA A3 01/27/2020  . Aspiration into airway 01/27/2020  . Chronic anticoagulation 01/27/2020  . Paroxysmal atrial fibrillation ([place]) 06/15/2018  . Essential hypertension 06/15/2018  . NICM (nonischemic cardiomyopathy) ([place]) 06/15/2018  . Visit for monitoring Tikosyn therapy 06/12/2018    Lynnann Knudsen, Annye Rusk MS, CCC-SLP 07/18/2020, 12:19 PM  [facility] [address] [place], [place], [address] Phone: [phone]   Fax:  [phone]   Name: Dustin Schneider MRN: [id number] Date of Birth: 18-Mar-1956

## 2020-07-18 NOTE — Therapy (Signed)
[facility] [address] [place], [place], [address] Phone: [phone]   Fax:  [phone]  Occupational Therapy Treatment  Patient Details  Name: Dustin Schneider MRN: [id number] Date of Birth: 05-23-1956 No data recorded  Encounter Date: 07/18/2020   OT End of Session - 07/18/20 1039    Visit Number 33    Number of Visits 41    Date for OT Re-Evaluation 08/19/20    Authorization Type BC/BS - No auth required, covered 100%    Authorization Time Period week 5/8    OT Start Time 0935    OT Stop Time 1015    OT Time Calculation (min) 40 min    Activity Tolerance Patient tolerated treatment well    Behavior During Therapy [facility] for tasks assessed/performed           Past Medical History:  Diagnosis Date  . Hypertension   . NICM (nonischemic cardiomyopathy) ([place]) 06/15/2018   04/23/18: EF 45%, 09/15/18: NOrmal LVEF  . Paroxysmal atrial fibrillation ([place]) 06/15/2018  . Stroke ([place])   . Visit for monitoring Tikosyn therapy 06/12/2018    Past Surgical History:  Procedure Laterality Date  . BUBBLE STUDY  03/11/2020   Procedure: BUBBLE STUDY;  Surgeon: [name], MD;  Location: [facility];  Service: Cardiovascular;;  . CARDIOVERSION N/A 05/13/2018   Procedure: CARDIOVERSION;  Surgeon: [name], MD;  Location: [facility] ENDOSCOPY;  Service: Cardiovascular;  Laterality: N/A;  . CARDIOVERSION N/A 06/13/2018   Procedure: CARDIOVERSION;  Surgeon: [name], MD;  Location: [facility] ENDOSCOPY;  Service: Cardiovascular;  Laterality: N/A;  . CARDIOVERSION N/A 06/23/2018   Procedure: CARDIOVERSION;  Surgeon: [name], MD;  Location: [facility] ENDOSCOPY;  Service: Cardiovascular;  Laterality: N/A;  . CARDIOVERSION N/A 03/11/2020   Procedure: CARDIOVERSION;  Surgeon: [name], MD;  Location: [place];  Service: Cardiovascular;  Laterality: N/A;  . excision of actinic keratosis    . EYE SURGERY     eye lift  . IR CT HEAD LTD  01/27/2020   . IR PERCUTANEOUS ART THROMBECTOMY/INFUSION INTRACRANIAL INC DIAG ANGIO  01/27/2020      . IR PERCUTANEOUS ART THROMBECTOMY/INFUSION INTRACRANIAL INC DIAG ANGIO  01/27/2020  . RADIOLOGY WITH ANESTHESIA N/A 01/27/2020   Procedure: IR WITH ANESTHESIA;  Surgeon: Radiologist, Medication, MD;  Location: [place] [facility];  Service: Radiology;  Laterality: N/A;  . TEE WITHOUT CARDIOVERSION N/A 03/11/2020   Procedure: TRANSESOPHAGEAL ECHOCARDIOGRAM (TEE);  Surgeon: [name], MD;  Location: [facility];  Service: Cardiovascular;  Laterality: N/A;  . TONSILLECTOMY    . WISDOM TOOTH EXTRACTION      There were no vitals filed for this visit.   Subjective Assessment - 07/18/20 0938    Subjective  no pain and no falls    Patient is accompanied by: Family member    Pertinent History Lt MCA CVA 01/27/20, readmitted to hospital with HAP and fever on 03/06/20 and released 03/11/20. PMH: paroxsymal A-fib, HTN    Limitations fall    Currently in Pain? No/denies           Reviewed self stretch and chest press in supine with patient/family and reinforced correct positioning and moving slowly through movement. Seated: reviewed AA/ROM in low and mid range sh flexion with elbow ext using pool noodle.  Reviewed functional tasks pt should be doing daily with RUE and importance of not only doing at d/c but also currently for days he is not in therapy. Discussed further estim unit for home -  pt's brother was not here today but they are looking at purchasing device recommended last session.  NMES to wrist/finger extensors x 10 min (previous parameters)                        OT Short Term Goals - 06/13/20 1245      OT SHORT TERM GOAL #1   Title Independent with initial HEP    Time 4    Period Weeks    Status Achieved      OT SHORT TERM GOAL #2   Title Pt to perform low to mid level reaching of light objects RUE in prep for feeding self and bathing    Time 4    Period Weeks    Status Partially Met    low level, inconsistent mid level     OT SHORT TERM GOAL #3   Title Pt to feed self 50% of the time with Rt dominant hand and A/E prn    Time 4    Period Weeks    Status On-going   06/13/20: Pt reports eating yogurt/breakfast and some finger foods, but using Lt hand for lunch/dinner)     OT SHORT TERM GOAL #4   Title Pt to perform UE dressing of pullover shirt mod I level    Time 4    Period Weeks    Status Achieved   met per pt and brother     OT SHORT TERM GOAL #5   Title Pt to perform LE dressing and all bathing with no more than mod assist using A/E and strategies prn    Time 4    Period Weeks    Status Achieved   04/20/20:  set-up for bedside bath, mod A for LE dressing     OT SHORT TERM GOAL #6   Title Pt to perform toileting and toilet transfers with no more than min assist overall    Time 4    Period Weeks    Status Achieved   supervision     OT SHORT TERM GOAL #7   Title Improve functional use of RUE as evidenced by performing 8 blocks on Box & Blocks test    Baseline 0    Time 4    Period Weeks    Status Achieved   04/20/20:  6 blocks, 05/05/20: 12 blocks            OT Long Term Goals - 06/13/20 1248      OT LONG TERM GOAL #1   Title Pt to be independent with updated HEP    Time 12    Period Weeks    Status On-going      OT LONG TERM GOAL #2   Title Pt to perform all BADLS at mod I level    Time 12    Period Weeks    Status On-going   min assist for LE dressing     OT LONG TERM GOAL #3   Title Pt to feed self 50% or greater with Rt dominant hand and grooming 25% or more with Rt hand    Time 12    Period Weeks    Status Revised      OT LONG TERM GOAL #4   Title Pt to retrieve light weight objects from high shelf RUE consistently    Time 12    Period Weeks    Status On-going      OT LONG TERM GOAL #5  Title Pt to demo 25 lbs grip strength or more Rt dominant hand for gripping, opening jars/containers    Time 12    Period Weeks    Status On-going    12.5 lbs     OT LONG TERM GOAL #6   Title Pt to perform simple meal prep and light cleaning tasks mod I level    Time 12    Period Weeks    Status Deferred   deferred d/t inability to stand/ambulate without assist and AFO     OT LONG TERM GOAL #7   Title Improve functional use RUE as evidenced by performing 25 blocks on Box & Blocks    Time 12    Period Weeks    Status Revised   12 blocks     OT LONG TERM GOAL #8   Title Pt to returned to modified art/sculpting tasks with task modifications and A/E prn    Time 12    Period Weeks    Status Deferred                 Plan - 07/18/20 1040    Clinical Impression Statement Pt/family with greater understanding of HEP and functional activities to do at home to reinforce what he has learned in therapy. Anticipate d/c end of December.    Occupational performance deficits (Please refer to evaluation for details): ADL's;IADL's;Work;Leisure;Social Participation    Body Structure / Function / Physical Skills ADL;ROM;Dexterity;Balance;IADL;Body mechanics;Improper spinal/pelvic alignment;Sensation;Mobility;Flexibility;Strength;Coordination;FMC;Tone;UE functional use;Decreased knowledge of use of DME;Proprioception    Rehab Potential Good    Comorbidities Affecting Occupational Performance: May have comorbidities impacting occupational performance    OT Frequency 2x / week    OT Duration 8 weeks   additional 8 weeks beginning week of 06/20/20   OT Treatment/Interventions Self-care/ADL training;Therapeutic exercise;Functional Mobility Training;Neuromuscular education;Manual Therapy;Splinting;Aquatic Therapy;Manual lymph drainage;Therapeutic activities;Coping strategies training;DME and/or AE instruction;Electrical Stimulation;Fluidtherapy;Passive range of motion;Patient/family education    Plan follow up on potential purchase of estim unit?, continue forced use of RUE    Consulted and Agree with Plan of Care Patient    Family Member Consulted  brother           Patient will benefit from skilled therapeutic intervention in order to improve the following deficits and impairments:   Body Structure / Function / Physical Skills: ADL, ROM, Dexterity, Balance, IADL, Body mechanics, Improper spinal/pelvic alignment, Sensation, Mobility, Flexibility, Strength, Coordination, FMC, Tone, UE functional use, Decreased knowledge of use of DME, Proprioception       Visit Diagnosis: Hemiplegia and hemiparesis following cerebral infarction affecting right dominant side (HCC)  Muscle weakness (generalized)  Other lack of coordination    Problem List Patient Active Problem List   Diagnosis Date Noted  . Benign essential HTN 06/09/2020  . Abnormality of gait 04/28/2020  . Neurogenic bladder 03/17/2020  . Sepsis due to pneumonia ([place]) 03/06/2020  . History of stroke with residual deficit 03/06/2020  . Transient hypotension 03/06/2020  . Spastic hemiplegia affecting nondominant side ([place])   . History of hypertension   . Expressive aphasia   . AKI (acute kidney injury) ([place])   . Global aphasia   . Hypoalbuminemia due to protein-calorie malnutrition ([place])   . Dysphagia, post-stroke   . Hyperlipidemia 02/02/2020  . Dysphagia following cerebral infarction 02/02/2020  . Aspiration pneumonia ([place]) 02/02/2020  . Acute ischemic left MCA stroke ([place]) 02/02/2020  . Acute respiratory failure ([place])   . Acute ischemic stroke [facility]) s/p clot retrieval L MCA & ACA  A3 01/27/2020  . Aspiration into airway 01/27/2020  . Chronic anticoagulation 01/27/2020  . Paroxysmal atrial fibrillation ([place]) 06/15/2018  . Essential hypertension 06/15/2018  . NICM (nonischemic cardiomyopathy) ([place]) 06/15/2018  . Visit for monitoring Tikosyn therapy 06/12/2018    Carey Bullocks, OTR/L 07/18/2020, 10:42 AM  [facility] [address] [place], [place], [address] Phone: [phone]   Fax:   [phone]  Name: JERIMIAH WOLMAN MRN: [id number] Date of Birth: 11-07-1955

## 2020-07-18 NOTE — Therapy (Signed)
[facility] [address] [place], [place], [address] Phone: [phone]   Fax:  [phone]  Physical Therapy Treatment  Patient Details  Name: Dustin Schneider MRN: [id number] Date of Birth: [birth year] Referring Provider (PT): Lauraine Rinne, PA-C (followed by Dr. Delice Lesch)   Encounter Date: 07/18/2020   PT End of Session - 07/18/20 1347    Visit Number 37    Number of Visits 48    Date for PT Re-Evaluation 09/01/20    Authorization Type BCBS    PT Start Time [phone]    PT Stop Time 0930    PT Time Calculation (min) 43 min    Equipment Utilized During Treatment Gait belt    Activity Tolerance Patient tolerated treatment well    Behavior During Therapy [facility] for tasks assessed/performed           Past Medical History:  Diagnosis Date  . Hypertension   . NICM (nonischemic cardiomyopathy) ([place]) 06/15/2018   04/23/18: EF 45%, 09/15/18: NOrmal LVEF  . Paroxysmal atrial fibrillation ([place]) 06/15/2018  . Stroke ([place])   . Visit for monitoring Tikosyn therapy 06/12/2018    Past Surgical History:  Procedure Laterality Date  . BUBBLE STUDY  03/11/2020   Procedure: BUBBLE STUDY;  Surgeon: [name], MD;  Location: [place];  Service: Cardiovascular;;  . CARDIOVERSION N/A 05/13/2018   Procedure: CARDIOVERSION;  Surgeon: [name], MD;  Location: [facility] ENDOSCOPY;  Service: Cardiovascular;  Laterality: N/A;  . CARDIOVERSION N/A 06/13/2018   Procedure: CARDIOVERSION;  Surgeon: [name], MD;  Location: [facility] ENDOSCOPY;  Service: Cardiovascular;  Laterality: N/A;  . CARDIOVERSION N/A 06/23/2018   Procedure: CARDIOVERSION;  Surgeon: [name], MD;  Location: [facility] ENDOSCOPY;  Service: Cardiovascular;  Laterality: N/A;  . CARDIOVERSION N/A 03/11/2020   Procedure: CARDIOVERSION;  Surgeon: [name], MD;  Location: [place];  Service: Cardiovascular;  Laterality: N/A;  . excision of actinic keratosis    . EYE SURGERY     eye  lift  . IR CT HEAD LTD  01/27/2020  . IR PERCUTANEOUS ART THROMBECTOMY/INFUSION INTRACRANIAL INC DIAG ANGIO  01/27/2020      . IR PERCUTANEOUS ART THROMBECTOMY/INFUSION INTRACRANIAL INC DIAG ANGIO  01/27/2020  . RADIOLOGY WITH ANESTHESIA N/A 01/27/2020   Procedure: IR WITH ANESTHESIA;  Surgeon: Radiologist, Medication, MD;  Location: [facility];  Service: Radiology;  Laterality: N/A;  . TEE WITHOUT CARDIOVERSION N/A 03/11/2020   Procedure: TRANSESOPHAGEAL ECHOCARDIOGRAM (TEE);  Surgeon: [name], MD;  Location: [place];  Service: Cardiovascular;  Laterality: N/A;  . TONSILLECTOMY    . WISDOM TOOTH EXTRACTION      Vitals:   07/18/20 0925  BP: 136/81  Pulse: 62     Subjective Assessment - 07/18/20 0851    Subjective Has been walking at home and it has been going well. [name] gf [name] reports no redness with wearing the AFO and pt reports no discomfort.    Pertinent History PMH: HTN, paroxysmal a fib    Limitations Standing;Walking    Patient Stated Goals "wants everything working on R side again"    Currently in Pain? No/denies                             [facility] Adult PT Treatment/Exercise - 07/18/20 0001      Transfers   Transfers Sit to Stand;Stand to Sit    Sit to Stand 5: Supervision;With upper extremity assist;From chair/3-in-1  Stand to Sit 5: Supervision;With upper extremity assist;To chair/3-in-1      Ambulation/Gait   Ambulation/Gait Yes    Ambulation/Gait Assistance 4: Min guard;4: Min assist    Ambulation/Gait Assistance Details on indoor surfaces: cues for slowed pace and incr RLE foot clearance and posture, on outdoor paved surfaces: continued cues for slowed gait esp down inclines and negotiating uneven pavement, min A at times for safety. Gf [name] following with w/c just to be safe in case pt needed a rest break ,however not needed. pt demonstrating incr R foot scuffing towards end of session due to fatigue.    Ambulation Distance (Feet) 345 Feet   x1  indoors, x500 outdoors   Assistive device Rolling walker    Gait Pattern Step-through pattern;Decreased hip/knee flexion - right    Ambulation Surface Level;Indoor    Gait Comments Small distance gait around chairs/cones to practice turns and then progressing to moving obstacles closer together to practice in more narrow spaces min A at times for balance, cues to slow down, one episode of pt's R foot having incr difficulty clearing, performed 6 reps total (spanning approx. 15-20')                   PT Short Term Goals - 07/01/20 1552      PT SHORT TERM GOAL #1   Title Pt will be mod I with all transfers with use of right AFO once he receives it for improved mobility.    Baseline Mod-I with all transfers 07/01/20    Time 4    Period Weeks    Status Achieved    Target Date 07/03/20      PT SHORT TERM GOAL #2   Title Pt will be able to ambulate > 345' with RW with right AFO and hand grip attachment min assist for improved mobility.    Baseline 345' with RW w/ hand grip and R AFO with min A    Time 4    Period Weeks    Status Achieved    Target Date 07/03/20      PT SHORT TERM GOAL #3   Title Pt will decrease 5 x sit to stand from 17.98 sec to <15 sec for improved balance and functional strength.    Baseline 06/03/20 17.98 sec from mat with hand; 16.4 s with hands from mat 07/01/20    Time 4    Period Weeks    Status On-going    Target Date 07/31/20      PT SHORT TERM GOAL #4   Title Pt will be instructed in proper AFO wear when new brace received.    Time 4    Period Weeks    Status New    Target Date 07/31/20             PT Long Term Goals - 06/03/20 1057      PT LONG TERM GOAL #1   Title Pt and pt's family members will be independent with final HEP in order to build upon functional gains made in therapy. ALL LTGS DUE 09/01/20    Baseline Pt has been performing current HEP with family but PT continues to add to it 06/03/20    Time 12    Period Weeks     Status On-going    Target Date 09/01/20      PT LONG TERM GOAL #2   Title Pt will ambulate >500' on varied surfaces supervision with RW and R AFO for improved  household mobility and short community distances.    Baseline 230' with RW with right hand grip attachment and R AFO min assist on 06/03/20    Time 12    Period Weeks    Status Revised    Target Date 09/01/20      PT LONG TERM GOAL #3   Title Pt will decrease TUG from 27 sec to <20 sec for improved balance and decreased fall risk.    Baseline 06/03/20 TUG=27 sec with RW    Time 12    Period Weeks    Status New    Target Date 09/01/20      PT LONG TERM GOAL #4   Title Pt will be able to ambulate up/down curb with RW and R AFO CGA for improved community access.    Time 12    Period Weeks    Status New    Target Date 09/01/20                 Plan - 07/18/20 1348    Clinical Impression Statement Today's skilled session continued to focus on gait training with RW and R AFO. Pt needing min A at times when performing turns around obstacles and continues to need cues for slowed gait speed and focusing on picking up RLE. Ambulated outdoors over paved surfaces with min guard/min A, pt with no LOB, but cues needed for slowed pace esp over uneven pavement and going down inclines. BP WFL after gait. Pt is making great progress, will continue to progress towards LTGs.    Personal Factors and Comorbidities Comorbidity 3+    Comorbidities dense L MCA CVA, paroxysmal A. fib on chronic Xarelto, nonischemic cardiomyopathy and hypertension.    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, works as an Electrical engineer Evolving/Moderate complexity    Rehab Potential Good    PT Frequency 2x / week    PT Duration 12 weeks    PT  Treatment/Interventions ADLs/Self Care Home Management;Aquatic Therapy;DME Instruction;Gait training;Stair training;Functional mobility training;Therapeutic activities;Therapeutic exercise;Electrical Stimulation;Balance training;Neuromuscular re-education;Wheelchair mobility training;Orthotic Fit/Training;Patient/family education;Passive range of motion;Energy conservation    PT Next Visit Plan continue gait with RW in clinic - obstacle negotiation and gait training outdoors when weather permits and up/down inclines. hip extensor strengthening RLE, continue step ups. weight shifting/NMR to RLE. continue with  balance on compliant surfaces.    PT Home Exercise Plan on 06/28/20 - added standing at [place] and stepping LLE out and in    Consulted and Agree with Plan of Care Patient;Family member/caregiver    Family Member Consulted brother-in-law, [name]           Patient will benefit from skilled therapeutic intervention in order to improve the following deficits and impairments:  Abnormal gait, Decreased activity tolerance, Decreased balance, Decreased cognition, Decreased mobility, Decreased coordination, Decreased range of motion, Decreased endurance, Decreased strength, Hypomobility, Difficulty walking, Impaired UE functional use, Impaired vision/preception, Postural dysfunction, Impaired sensation  Visit Diagnosis: Hemiplegia and hemiparesis following cerebral infarction affecting right dominant side (HCC)  Muscle weakness (generalized)  Other lack of coordination  Other abnormalities of gait and mobility  Other symptoms and signs involving the nervous system     Problem List Patient Active Problem List   Diagnosis Date Noted  . Benign essential HTN 06/09/2020  . Abnormality of gait 04/28/2020  . Neurogenic bladder 03/17/2020  . Sepsis due to pneumonia ([place]) 03/06/2020  .  History of stroke with residual deficit 03/06/2020  . Transient hypotension 03/06/2020  . Spastic  hemiplegia affecting nondominant side ([place])   . History of hypertension   . Expressive aphasia   . AKI (acute kidney injury) ([place])   . Global aphasia   . Hypoalbuminemia due to protein-calorie malnutrition ([place])   . Dysphagia, post-stroke   . Hyperlipidemia 02/02/2020  . Dysphagia following cerebral infarction 02/02/2020  . Aspiration pneumonia ([place]) 02/02/2020  . Acute ischemic left MCA stroke ([place]) 02/02/2020  . Acute respiratory failure ([place])   . Acute ischemic stroke [facility]) s/p clot retrieval L MCA & ACA A3 01/27/2020  . Aspiration into airway 01/27/2020  . Chronic anticoagulation 01/27/2020  . Paroxysmal atrial fibrillation ([place]) 06/15/2018  . Essential hypertension 06/15/2018  . NICM (nonischemic cardiomyopathy) ([place]) 06/15/2018  . Visit for monitoring Tikosyn therapy 06/12/2018    Arliss Journey, PT, DPT  07/18/2020, 1:54 PM  [facility] [address] [place], [place], [address] Phone: [phone]   Fax:  [phone]  Name: KRYSTAL DELDUCA MRN: [id number] Date of Birth: 1955-09-03

## 2020-07-18 NOTE — Patient Instructions (Signed)
    Have Dustin Schneider or Dustin Schneider write down some things that you would want to talk about  Use specific names, wifes, pets   Bring a written list (mini notebook) with sentences or questions you may want to say   Ask about holiday plans  Add your holiday plans practiced  I have oyster roasts on New Years   Practice before a store "I'm picking up my prescription"   Practice name, birthdate, address

## 2020-07-21 ENCOUNTER — Ambulatory Visit: Payer: BC Managed Care – PPO | Admitting: Occupational Therapy

## 2020-07-21 ENCOUNTER — Other Ambulatory Visit: Payer: Self-pay

## 2020-07-21 ENCOUNTER — Encounter: Payer: Self-pay | Admitting: Physical Therapy

## 2020-07-21 ENCOUNTER — Ambulatory Visit: Payer: BC Managed Care – PPO | Admitting: Physical Therapy

## 2020-07-21 DIAGNOSIS — I69351 Hemiplegia and hemiparesis following cerebral infarction affecting right dominant side: Secondary | ICD-10-CM

## 2020-07-21 DIAGNOSIS — R2689 Other abnormalities of gait and mobility: Secondary | ICD-10-CM

## 2020-07-21 DIAGNOSIS — M6281 Muscle weakness (generalized): Secondary | ICD-10-CM

## 2020-07-21 DIAGNOSIS — R2681 Unsteadiness on feet: Secondary | ICD-10-CM

## 2020-07-21 DIAGNOSIS — R278 Other lack of coordination: Secondary | ICD-10-CM

## 2020-07-21 DIAGNOSIS — R262 Difficulty in walking, not elsewhere classified: Secondary | ICD-10-CM

## 2020-07-21 NOTE — Therapy (Signed)
[facility] [address] [place], [place], [address] Phone: [phone]   Fax:  [phone]  Occupational Therapy Treatment  Patient Details  Name: Dustin Schneider MRN: [id number] Date of Birth: 1956-04-10 No data recorded  Encounter Date: 07/21/2020   OT End of Session - 07/21/20 1002    Visit Number 34    Number of Visits 41    Date for OT Re-Evaluation 08/19/20    Authorization Type BC/BS - No auth required, covered 100%    Authorization Time Period week 5/8    OT Start Time 0930    OT Stop Time 1015    OT Time Calculation (min) 45 min    Activity Tolerance Patient tolerated treatment well    Behavior During Therapy [facility] for tasks assessed/performed           Past Medical History:  Diagnosis Date  . Hypertension   . NICM (nonischemic cardiomyopathy) ([place]) 06/15/2018   04/23/18: EF 45%, 09/15/18: NOrmal LVEF  . Paroxysmal atrial fibrillation ([place]) 06/15/2018  . Stroke ([place])   . Visit for monitoring Tikosyn therapy 06/12/2018    Past Surgical History:  Procedure Laterality Date  . BUBBLE STUDY  03/11/2020   Procedure: BUBBLE STUDY;  Surgeon: [name], MD;  Location: [place];  Service: Cardiovascular;;  . CARDIOVERSION N/A 05/13/2018   Procedure: CARDIOVERSION;  Surgeon: [name], MD;  Location: [facility] ENDOSCOPY;  Service: Cardiovascular;  Laterality: N/A;  . CARDIOVERSION N/A 06/13/2018   Procedure: CARDIOVERSION;  Surgeon: [name], MD;  Location: [facility] ENDOSCOPY;  Service: Cardiovascular;  Laterality: N/A;  . CARDIOVERSION N/A 06/23/2018   Procedure: CARDIOVERSION;  Surgeon: [name], MD;  Location: [facility] ENDOSCOPY;  Service: Cardiovascular;  Laterality: N/A;  . CARDIOVERSION N/A 03/11/2020   Procedure: CARDIOVERSION;  Surgeon: [name], MD;  Location: [place];  Service: Cardiovascular;  Laterality: N/A;  . excision of actinic keratosis    . EYE SURGERY     eye lift  . IR CT HEAD LTD  01/27/2020   . IR PERCUTANEOUS ART THROMBECTOMY/INFUSION INTRACRANIAL INC DIAG ANGIO  01/27/2020      . IR PERCUTANEOUS ART THROMBECTOMY/INFUSION INTRACRANIAL INC DIAG ANGIO  01/27/2020  . RADIOLOGY WITH ANESTHESIA N/A 01/27/2020   Procedure: IR WITH ANESTHESIA;  Surgeon: Radiologist, Medication, MD;  Location: [place];  Service: Radiology;  Laterality: N/A;  . TEE WITHOUT CARDIOVERSION N/A 03/11/2020   Procedure: TRANSESOPHAGEAL ECHOCARDIOGRAM (TEE);  Surgeon: [name], MD;  Location: [place];  Service: Cardiovascular;  Laterality: N/A;  . TONSILLECTOMY    . WISDOM TOOTH EXTRACTION      There were no vitals filed for this visit.   Subjective Assessment - 07/21/20 0939    Subjective  Pt reports he likes Pen AGain    Patient is accompanied by: Family member    Pertinent History Lt MCA CVA 01/27/20, readmitted to hospital with HAP and fever on 03/06/20 and released 03/11/20. PMH: paroxsymal A-fib, HTN    Limitations fall    Currently in Pain? No/denies                        OT Treatments/Exercises (OP) - 07/21/20 1014      ADLs   Eating Simulated eating with built up spoon. Pt able to do with min drops. Attempted use w/ Rt angled spoon but did not like and had greater success with built up utensil    Writing Practiced writing name with various pens-  Pt did best with Pen Again. Pt approx 75% legibility with name only using Pen Again      Exercises   Exercises --   UBE x 5 min (Rt hand wrapped)     Fine Motor Coordination (Hand/Wrist)   Large Pegboard Pt placing large pegs in pegboard Rt hand with assist from Lt hand to turn peg around for placement. Pt completed 2 rows with extra time                    OT Short Term Goals - 06/13/20 1245      OT SHORT TERM GOAL #1   Title Independent with initial HEP    Time 4    Period Weeks    Status Achieved      OT SHORT TERM GOAL #2   Title Pt to perform low to mid level reaching of light objects RUE in prep for feeding  self and bathing    Time 4    Period Weeks    Status Partially Met   low level, inconsistent mid level     OT SHORT TERM GOAL #3   Title Pt to feed self 50% of the time with Rt dominant hand and A/E prn    Time 4    Period Weeks    Status On-going   06/13/20: Pt reports eating yogurt/breakfast and some finger foods, but using Lt hand for lunch/dinner)     OT SHORT TERM GOAL #4   Title Pt to perform UE dressing of pullover shirt mod I level    Time 4    Period Weeks    Status Achieved   met per pt and brother     OT SHORT TERM GOAL #5   Title Pt to perform LE dressing and all bathing with no more than mod assist using A/E and strategies prn    Time 4    Period Weeks    Status Achieved   04/20/20:  set-up for bedside bath, mod A for LE dressing     OT SHORT TERM GOAL #6   Title Pt to perform toileting and toilet transfers with no more than min assist overall    Time 4    Period Weeks    Status Achieved   supervision     OT SHORT TERM GOAL #7   Title Improve functional use of RUE as evidenced by performing 8 blocks on Box & Blocks test    Baseline 0    Time 4    Period Weeks    Status Achieved   04/20/20:  6 blocks, 05/05/20: 12 blocks            OT Long Term Goals - 06/13/20 1248      OT LONG TERM GOAL #1   Title Pt to be independent with updated HEP    Time 12    Period Weeks    Status On-going      OT LONG TERM GOAL #2   Title Pt to perform all BADLS at mod I level    Time 12    Period Weeks    Status On-going   min assist for LE dressing     OT LONG TERM GOAL #3   Title Pt to feed self 50% or greater with Rt dominant hand and grooming 25% or more with Rt hand    Time 12    Period Weeks    Status Revised      OT  LONG TERM GOAL #4   Title Pt to retrieve light weight objects from high shelf RUE consistently    Time 12    Period Weeks    Status On-going      OT LONG TERM GOAL #5   Title Pt to demo 25 lbs grip strength or more Rt dominant hand for gripping,  opening jars/containers    Time 12    Period Weeks    Status On-going   12.5 lbs     OT LONG TERM GOAL #6   Title Pt to perform simple meal prep and light cleaning tasks mod I level    Time 12    Period Weeks    Status Deferred   deferred d/t inability to stand/ambulate without assist and AFO     OT LONG TERM GOAL #7   Title Improve functional use RUE as evidenced by performing 25 blocks on Box & Blocks    Time 12    Period Weeks    Status Revised   12 blocks     OT LONG TERM GOAL #8   Title Pt to returned to modified art/sculpting tasks with task modifications and A/E prn    Time 12    Period Weeks    Status Deferred                 Plan - 07/21/20 1003    Clinical Impression Statement Pt w/ increased accuracy using built up spoon with simulated feeding. Pt limited by coordination and ataxia    Occupational performance deficits (Please refer to evaluation for details): ADL's;IADL's;Work;Leisure;Social Participation    Body Structure / Function / Physical Skills ADL;ROM;Dexterity;Balance;IADL;Body mechanics;Improper spinal/pelvic alignment;Sensation;Mobility;Flexibility;Strength;Coordination;FMC;Tone;UE functional use;Decreased knowledge of use of DME;Proprioception    Rehab Potential Good    Comorbidities Affecting Occupational Performance: May have comorbidities impacting occupational performance    OT Frequency 2x / week    OT Duration 8 weeks   additional 8 weeks beginning week of 06/20/20   OT Treatment/Interventions Self-care/ADL training;Therapeutic exercise;Functional Mobility Training;Neuromuscular education;Manual Therapy;Splinting;Aquatic Therapy;Manual lymph drainage;Therapeutic activities;Coping strategies training;DME and/or AE instruction;Electrical Stimulation;Fluidtherapy;Passive range of motion;Patient/family education    Plan follow up on potential purchase of estim unit?, continue forced use of RUE    Consulted and Agree with Plan of Care Patient     Family Member Consulted brother           Patient will benefit from skilled therapeutic intervention in order to improve the following deficits and impairments:   Body Structure / Function / Physical Skills: ADL,ROM,Dexterity,Balance,IADL,Body mechanics,Improper spinal/pelvic alignment,Sensation,Mobility,Flexibility,Strength,Coordination,FMC,Tone,UE functional use,Decreased knowledge of use of DME,Proprioception       Visit Diagnosis: Hemiplegia and hemiparesis following cerebral infarction affecting right dominant side ([place])  Other lack of coordination  Muscle weakness (generalized)    Problem List Patient Active Problem List   Diagnosis Date Noted  . Benign essential HTN 06/09/2020  . Abnormality of gait 04/28/2020  . Neurogenic bladder 03/17/2020  . Sepsis due to pneumonia ([place]) 03/06/2020  . History of stroke with residual deficit 03/06/2020  . Transient hypotension 03/06/2020  . Spastic hemiplegia affecting nondominant side ([place])   . History of hypertension   . Expressive aphasia   . AKI (acute kidney injury) ([place])   . Global aphasia   . Hypoalbuminemia due to protein-calorie malnutrition ([place])   . Dysphagia, post-stroke   . Hyperlipidemia 02/02/2020  . Dysphagia following cerebral infarction 02/02/2020  . Aspiration pneumonia ([place]) 02/02/2020  . Acute ischemic left MCA stroke ([place]) 02/02/2020  .  Acute respiratory failure ([place])   . Acute ischemic stroke [facility]) s/p clot retrieval L MCA & ACA A3 01/27/2020  . Aspiration into airway 01/27/2020  . Chronic anticoagulation 01/27/2020  . Paroxysmal atrial fibrillation ([place]) 06/15/2018  . Essential hypertension 06/15/2018  . NICM (nonischemic cardiomyopathy) ([place]) 06/15/2018  . Visit for monitoring Tikosyn therapy 06/12/2018    Teena Irani 07/21/2020, 11:10 AM  [facility] [address] [place], [place], [address] Phone: [phone]   Fax:   [phone]  Name: Dustin Schneider MRN: [id number] Date of Birth: 12-Oct-1955

## 2020-07-22 NOTE — Therapy (Signed)
[facility] [address] [place], [place], [address] Phone: [phone]   Fax:  [phone]  Physical Therapy Treatment  Patient Details  Name: Dustin Schneider MRN: [id number] Date of Birth: 1955/11/07 Referring Provider (PT): Dustin Rinne, PA-C (followed by Dr. Delice Schneider)   Encounter Date: 07/21/2020   PT End of Session - 07/21/20 0858    Visit Number 38    Number of Visits 48    Date for PT Re-Evaluation 09/01/20    Authorization Type BCBS    PT Start Time 0849    PT Stop Time 0929    PT Time Calculation (min) 40 min    Equipment Utilized During Treatment Gait belt    Activity Tolerance Patient tolerated treatment well    Behavior During Therapy [facility] for tasks assessed/performed           Past Medical History:  Diagnosis Date  . Hypertension   . NICM (nonischemic cardiomyopathy) (Dustin Schneider) 06/15/2018   04/23/18: EF 45%, 09/15/18: NOrmal LVEF  . Paroxysmal atrial fibrillation ([place]) 06/15/2018  . Stroke ([place])   . Visit for monitoring Tikosyn therapy 06/12/2018    Past Surgical History:  Procedure Laterality Date  . BUBBLE STUDY  03/11/2020   Procedure: BUBBLE STUDY;  Surgeon: Dustin Prows, MD;  Location: [place];  Service: Cardiovascular;;  . CARDIOVERSION N/A 05/13/2018   Procedure: CARDIOVERSION;  Surgeon: Dustin Prows, MD;  Location: [facility] ENDOSCOPY;  Service: Cardiovascular;  Laterality: N/A;  . CARDIOVERSION N/A 06/13/2018   Procedure: CARDIOVERSION;  Surgeon: Dustin Hector, MD;  Location: [facility] ENDOSCOPY;  Service: Cardiovascular;  Laterality: N/A;  . CARDIOVERSION N/A 06/23/2018   Procedure: CARDIOVERSION;  Surgeon: Dustin Prows, MD;  Location: [facility] ENDOSCOPY;  Service: Cardiovascular;  Laterality: N/A;  . CARDIOVERSION N/A 03/11/2020   Procedure: CARDIOVERSION;  Surgeon: Dustin Prows, MD;  Location: [place];  Service: Cardiovascular;  Laterality: N/A;  . excision of actinic keratosis    . EYE SURGERY     eye  lift  . IR CT HEAD LTD  01/27/2020  . IR PERCUTANEOUS ART THROMBECTOMY/INFUSION INTRACRANIAL INC DIAG ANGIO  01/27/2020      . IR PERCUTANEOUS ART THROMBECTOMY/INFUSION INTRACRANIAL INC DIAG ANGIO  01/27/2020  . RADIOLOGY WITH ANESTHESIA N/A 01/27/2020   Procedure: IR WITH ANESTHESIA;  Surgeon: Dustin Schneider, Medication, MD;  Location: [place];  Service: Radiology;  Laterality: N/A;  . TEE WITHOUT CARDIOVERSION N/A 03/11/2020   Procedure: TRANSESOPHAGEAL ECHOCARDIOGRAM (TEE);  Surgeon: Dustin Prows, MD;  Location: [place];  Service: Cardiovascular;  Laterality: N/A;  . TONSILLECTOMY    . WISDOM TOOTH EXTRACTION      There were no vitals filed for this visit.   Subjective Assessment - 07/21/20 0855    Subjective No new complaints. No falls or pain to report.    Pertinent History PMH: HTN, paroxysmal a fib    Limitations Standing;Walking    Patient Stated Goals "wants everything working on R side again"    Currently in Pain? No/denies    Pain Score 0-No pain                 [facility] Adult PT Treatment/Exercise - 07/21/20 0903      Transfers   Transfers Sit to Stand;Stand to Sit    Sit to Stand 5: Supervision;With upper extremity assist;From chair/3-in-1    Stand to Sit 5: Supervision;With upper extremity assist;To chair/3-in-1      Ambulation/Gait   Ambulation/Gait Yes    Ambulation/Gait Assistance 4:  Min guard;4: Min assist    Ambulation/Gait Assistance Details aorund track working on slowing down and focusing on right foot step placement and stace stability.    Ambulation Distance (Feet) 230 Feet   x1   Assistive device Rolling walker    Gait Pattern Step-through pattern;Decreased hip/knee flexion - right    Ambulation Surface Level;Indoor      High Level Balance   High Level Balance Activities Negotiating over obstacles    High Level Balance Comments with RW/brace/hand orthotic: forward stepping over 3 bolsters with cues for proper sequencing/technique and weight shifting.       Self-Care   Self-Care Other Self-Care Comments    Other Self-Care Comments  discussed gait at home on ramps as pt reported to brother in law he was told nto to walk on ramps at home by PT at last session. [name] (brother in Sports coach) reporstss they have been walking on indoor and outdoor ramps with RW/orthotics with no issues and was curious why pt was told to stop. Primary PT came over for discussion. Was reported that [name] did not feel confidend at last session and that was why that was said. If pt and [name] have been doing this with no issues it is okay to keep walking on them. Also discussed  current plan of care goes through January as they were asking if therapy ended at the end of December. Pt does needed to schedule, waiting to see what ST and OT are planning.      Neuro Re-ed    Neuro Re-ed Details  for balance/muscle re:ed: obstacle course with 4 cones<>red mat on floor- weaving around the cones and walking over the mat for 3 laps with up to mod assist needed toward the end of the 3rd lap. cues to slow down, focus on right step placement and walker positoin needed.                    PT Short Term Goals - 07/01/20 1552      PT SHORT TERM GOAL #1   Title Pt will be mod I with all transfers with use of right AFO once he receives it for improved mobility.    Baseline Mod-I with all transfers 07/01/20    Time 4    Period Weeks    Status Achieved    Target Date 07/03/20      PT SHORT TERM GOAL #2   Title Pt will be able to ambulate > 345' with RW with right AFO and hand grip attachment min assist for improved mobility.    Baseline 345' with RW w/ hand grip and R AFO with min A    Time 4    Period Weeks    Status Achieved    Target Date 07/03/20      PT SHORT TERM GOAL #3   Title Pt will decrease 5 x sit to stand from 17.98 sec to <15 sec for improved balance and functional strength.    Baseline 06/03/20 17.98 sec from mat with hand; 16.4 s with hands from mat 07/01/20     Time 4    Period Weeks    Status On-going    Target Date 07/31/20      PT SHORT TERM GOAL #4   Title Pt will be instructed in proper AFO wear when new brace received.    Time 4    Period Weeks    Status New    Target Date 07/31/20  PT Long Term Goals - 06/03/20 1057      PT LONG TERM GOAL #1   Title Pt and pt's family members will be independent with final HEP in order to build upon functional gains made in therapy. ALL LTGS DUE 09/01/20    Baseline Pt has been performing current HEP with family but PT continues to add to it 06/03/20    Time 12    Period Weeks    Status On-going    Target Date 09/01/20      PT LONG TERM GOAL #2   Title Pt will ambulate >500' on varied surfaces supervision with RW and R AFO for improved household mobility and short community distances.    Baseline 230' with RW with right hand grip attachment and R AFO min assist on 06/03/20    Time 12    Period Weeks    Status Revised    Target Date 09/01/20      PT LONG TERM GOAL #3   Title Pt will decrease TUG from 27 sec to <20 sec for improved balance and decreased fall risk.    Baseline 06/03/20 TUG=27 sec with RW    Time 12    Period Weeks    Status New    Target Date 09/01/20      PT LONG TERM GOAL #4   Title Pt will be able to ambulate up/down curb with RW and R AFO CGA for improved community access.    Time 12    Period Weeks    Status New    Target Date 09/01/20                 Plan - 07/21/20 1630    Clinical Impression Statement Today's skilled session continued to focus on gait and balance with RW/orthotics with increased assistance needed with object negotiation and on compliant surfaces vs flat staight away surfaces. Rest breaks taken as needed due to fatigue, no other issues noted or reported. The pt is progressing toward goals and should benefit from continued PT to progress toward unmet goals.    Personal Factors and Comorbidities Comorbidity 3+     Comorbidities dense L MCA CVA, paroxysmal A. fib on chronic Xarelto, nonischemic cardiomyopathy and hypertension.    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, works as an Electrical engineer Evolving/Moderate complexity    Rehab Potential Good    PT Frequency 2x / week    PT Duration 12 weeks    PT Treatment/Interventions ADLs/Self Care Home Management;Aquatic Therapy;DME Instruction;Gait training;Stair training;Functional mobility training;Therapeutic activities;Therapeutic exercise;Electrical Stimulation;Balance training;Neuromuscular re-education;Wheelchair mobility training;Orthotic Fit/Training;Patient/family education;Passive range of motion;Energy conservation    PT Next Visit Plan continue gait with RW in clinic - obstacle negotiation and gait training outdoors when weather permits and up/down inclines. hip extensor strengthening RLE, continue step ups. weight shifting/NMR to RLE. continue with  balance on compliant surfaces.    PT Home Exercise Plan on 06/28/20 - added standing at [place] and stepping LLE out and in    Consulted and Agree with Plan of Care Patient;Family member/caregiver    Family Member Consulted brother-in-law, [name]           Patient will benefit from skilled therapeutic intervention in order to improve the following deficits and impairments:  Abnormal gait,Decreased activity tolerance,Decreased balance,Decreased cognition,Decreased mobility,Decreased coordination,Decreased range of motion,Decreased endurance,Decreased strength,Hypomobility,Difficulty walking,Impaired UE functional use,Impaired vision/preception,Postural dysfunction,Impaired sensation  Visit  Diagnosis: Hemiplegia and hemiparesis following cerebral infarction affecting right dominant side  (H[facility])  Muscle weakness (generalized)  Other abnormalities of gait and mobility  Unsteadiness on feet  Difficulty in walking, not elsewhere classified     Problem List Patient Active Problem List   Diagnosis Date Noted  . Benign essential HTN 06/09/2020  . Abnormality of gait 04/28/2020  . Neurogenic bladder 03/17/2020  . Sepsis due to pneumonia ([place]) 03/06/2020  . History of stroke with residual deficit 03/06/2020  . Transient hypotension 03/06/2020  . Spastic hemiplegia affecting nondominant side ([place])   . History of hypertension   . Expressive aphasia   . AKI (acute kidney injury) ([place])   . Global aphasia   . Hypoalbuminemia due to protein-calorie malnutrition ([place])   . Dysphagia, post-stroke   . Hyperlipidemia 02/02/2020  . Dysphagia following cerebral infarction 02/02/2020  . Aspiration pneumonia ([place]) 02/02/2020  . Acute ischemic left MCA stroke ([employer]) 02/02/2020  . Acute respiratory failure ([place])   . Acute ischemic stroke [facility]) s/p clot retrieval L MCA & ACA A3 01/27/2020  . Aspiration into airway 01/27/2020  . Chronic anticoagulation 01/27/2020  . Paroxysmal atrial fibrillation ([place]) 06/15/2018  . Essential hypertension 06/15/2018  . NICM (nonischemic cardiomyopathy) ([place]) 06/15/2018  . Visit for monitoring Tikosyn therapy 06/12/2018    Willow Ora, PTA, [facility] [address] [place], [place] [address] [phone] 07/22/20, 2:26 PM   Name: Dustin Schneider MRN: [id number] Date of Birth: 12-28-1955

## 2020-07-25 ENCOUNTER — Other Ambulatory Visit: Payer: Self-pay

## 2020-07-25 ENCOUNTER — Ambulatory Visit: Payer: BC Managed Care – PPO | Admitting: Occupational Therapy

## 2020-07-25 ENCOUNTER — Ambulatory Visit: Payer: BC Managed Care – PPO | Admitting: Speech Pathology

## 2020-07-25 ENCOUNTER — Encounter: Payer: Self-pay | Admitting: Speech Pathology

## 2020-07-25 ENCOUNTER — Encounter: Payer: Self-pay | Admitting: Physical Therapy

## 2020-07-25 ENCOUNTER — Ambulatory Visit: Payer: BC Managed Care – PPO | Admitting: Physical Therapy

## 2020-07-25 DIAGNOSIS — R262 Difficulty in walking, not elsewhere classified: Secondary | ICD-10-CM

## 2020-07-25 DIAGNOSIS — I69351 Hemiplegia and hemiparesis following cerebral infarction affecting right dominant side: Secondary | ICD-10-CM

## 2020-07-25 DIAGNOSIS — M6281 Muscle weakness (generalized): Secondary | ICD-10-CM

## 2020-07-25 DIAGNOSIS — R2689 Other abnormalities of gait and mobility: Secondary | ICD-10-CM

## 2020-07-25 DIAGNOSIS — R4701 Aphasia: Secondary | ICD-10-CM

## 2020-07-25 DIAGNOSIS — R2681 Unsteadiness on feet: Secondary | ICD-10-CM

## 2020-07-25 DIAGNOSIS — R278 Other lack of coordination: Secondary | ICD-10-CM

## 2020-07-25 DIAGNOSIS — R482 Apraxia: Secondary | ICD-10-CM

## 2020-07-25 NOTE — Patient Instructions (Addendum)
   Keep up the good work [employer], jobs, hobbies of family that you may want to talk about  [place]   [facility]) in [place] with Brantley Stage and Sherre Poot  Virtual Connections through [place] or [place] in [place] also has virtual groups  Go grocery shopping and keep the list and name the items you are purchasing (including brands)

## 2020-07-25 NOTE — Therapy (Signed)
[facility] [address] [place], [place], [address] Phone: [phone]   Fax:  [phone]  Speech Language Pathology Treatment  Patient Details  Name: Dustin Schneider MRN: [id number] Date of Birth: 1956/07/06 Referring Provider (SLP): Dr. Delice Lesch   Encounter Date: 07/25/2020   End of Session - 07/25/20 1216    Visit Number 13    Number of Visits 25    Date for SLP Re-Evaluation 09/19/20    SLP Start Time 1016    SLP Stop Time  1245    SLP Time Calculation (min) 42 min           Past Medical History:  Diagnosis Date  . Hypertension   . NICM (nonischemic cardiomyopathy) ([place]) 06/15/2018   04/23/18: EF 45%, 09/15/18: NOrmal LVEF  . Paroxysmal atrial fibrillation ([place]) 06/15/2018  . Stroke ([place])   . Visit for monitoring Tikosyn therapy 06/12/2018    Past Surgical History:  Procedure Laterality Date  . BUBBLE STUDY  03/11/2020   Procedure: BUBBLE STUDY;  Surgeon: [name], MD;  Location: [facility];  Service: Cardiovascular;;  . CARDIOVERSION N/A 05/13/2018   Procedure: CARDIOVERSION;  Surgeon: [name], MD;  Location: [facility] ENDOSCOPY;  Service: Cardiovascular;  Laterality: N/A;  . CARDIOVERSION N/A 06/13/2018   Procedure: CARDIOVERSION;  Surgeon: [name], MD;  Location: [facility] ENDOSCOPY;  Service: Cardiovascular;  Laterality: N/A;  . CARDIOVERSION N/A 06/23/2018   Procedure: CARDIOVERSION;  Surgeon: [name], MD;  Location: [facility] ENDOSCOPY;  Service: Cardiovascular;  Laterality: N/A;  . CARDIOVERSION N/A 03/11/2020   Procedure: CARDIOVERSION;  Surgeon: [name], MD;  Location: [facility];  Service: Cardiovascular;  Laterality: N/A;  . excision of actinic keratosis    . EYE SURGERY     eye lift  . IR CT HEAD LTD  01/27/2020  . IR PERCUTANEOUS ART THROMBECTOMY/INFUSION INTRACRANIAL INC DIAG ANGIO  01/27/2020      . IR PERCUTANEOUS ART THROMBECTOMY/INFUSION INTRACRANIAL INC DIAG ANGIO  01/27/2020  .  RADIOLOGY WITH ANESTHESIA N/A 01/27/2020   Procedure: IR WITH ANESTHESIA;  Surgeon: Radiologist, Medication, MD;  Location: [facility];  Service: Radiology;  Laterality: N/A;  . TEE WITHOUT CARDIOVERSION N/A 03/11/2020   Procedure: TRANSESOPHAGEAL ECHOCARDIOGRAM (TEE);  Surgeon: [name], MD;  Location: [facility];  Service: Cardiovascular;  Laterality: N/A;  . TONSILLECTOMY    . WISDOM TOOTH EXTRACTION      There were no vitals filed for this visit.   Subjective Assessment - 07/25/20 1026    Subjective "He's been reading aloud"    Patient is accompained by: Family member   s/o [name]   Currently in Pain? No/denies    Pain Score 0-No pain                 ADULT SLP TREATMENT - 07/25/20 1030      General Information   Behavior/Cognition Alert;Cooperative;Pleasant mood      Treatment Provided   Treatment provided Cognitive-Linquistic      Cognitive-Linquistic Treatment   Treatment focused on Aphasia;Apraxia;Patient/family/caregiver education    Skilled Treatment Dustin Schneider enters today without device. 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 [name] added : "He got people to do this for him, somehow." Today, targeted multimodal communciation using written word to augment speech. He required frequent min A to use writing, however he was successful in aproximating written word so I could understand 8/10 words. Encouraged this outside  of therpay. Educated Dustin Schneider and [place], that spelling doesn't have to be correct, just the 1st few letters or approximation can A listener in understanding Dustin Schneider's message. Dustin Schneider used photo to name 6 people who caroled at this house and intlligibly approximated 3 songs they sang. He use writing, gestures and speech to communciate he may be part of aphasia resarch at [facility]. Syntax is improving with extended time, Dustin Schneider used 4 complete sentences in therapy. As he has been participating in aphasia group  at [facility], provided information re: [place] aphasia project and [employer] groups.      Assessment / Recommendations / Plan   Plan Goals updated      Progression Toward Goals   Progression toward goals Goals met and updated            SLP Education - 07/25/20 1211    Education Details use writin to augment speech; practice asking for and purchasing your new walker; TAP and virtual connections    Person(s) Educated Patient;Caregiver(s)    Methods Explanation;Demonstration;Handout    Comprehension Returned demonstration;Verbal cues required;Need further instruction            SLP Short Term Goals - 07/25/20 1215      SLP SHORT TERM GOAL #1   Title Pt will name family, friends and art items with occasional min A 8/10 with approximations allowed.    Time 1    Period Weeks    Status Partially Met      SLP SHORT TERM GOAL #2   Title Pt will name 7 items in personally relevant categories with occasional min A over 2 sessions    Time 1    Period Weeks    Status Partially Met      SLP SHORT TERM GOAL #3   Title Pt will perform 4 automatic speech tasks with rare min A.    Time 1    Period Weeks    Status Partially Met      SLP SHORT TERM GOAL #4   Title Pt will write personal information (name/address/phone), 5 family/friend/pet names and 7  professional words with occasional min A    Time 1    Period Weeks    Status Not Met      SLP SHORT TERM GOAL #5   Title Pt will use mulitmodal communication (gesture, draw, write 1st letter etc) to augment verbal expression to meet needs at home with rare min A from family.    Time 1    Period Weeks    Status Partially Met            SLP Long Term Goals - 07/25/20 1215      SLP LONG TERM GOAL #1   Title Pt will verbalize 3 words to explain or describe his artwork and hobbies with occasional min A over 3 sessions, allowing for intellgible approximations    Baseline 05-17-20 (Lingraphica cues), 05-24-20 (lingraphica),  05-31-20 (lingraphica); 07/25/20 (written word)    Status Achieved      SLP LONG TERM GOAL #2   Title Pt and family will utilize multimodal compensations for aphasia and verbal apraxia to participate in 3 turns each of conversation with occasional min A over 2 sessions    Baseline 07/25/20;    Time 1    Period Weeks    Status Achieved      SLP LONG TERM GOAL #3   Title Pt will write 3 word phrases with less than 2 errors with rare min  A over 3 sessions    Status Not Met      SLP LONG TERM GOAL #4   Title Reading assessment if indicated    Status Deferred      SLP LONG TERM GOAL #5   Title Dustin Schneider will use multimodal communication spontaneously  (writing, gesture, device) to augment verbal expression as needed 3/5 times with rare min A    Time 8    Period Weeks    Status New      Additional Long Term Goals   Additional Long Term Goals Yes      SLP LONG TERM GOAL #6   Title Dustin Schneider will write (approximate) 4 items in personally relevant category that is understood by family/friends    Time 8    Period Weeks    Status New      SLP LONG TERM GOAL #7   Title Pt will employ multimodal communication to participate in 6 turns in conversation with extended time and occasional min A    Time 8    Period Weeks    Status New            Plan - 07/25/20 1216    Clinical Impression Statement Improving moderate Broca's aphasia and severe verbal apraxia persist, affecting communication of basic wants, needs and social interactions.Dustin Schneider is highly motivated and 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 His utterance length in improving and sentences with accurate syntax are emerging with extended time and significant halting. Ongoing training in multimodal communication - . Continue skilled ST to maximize communication for QOL, safety and reduce family burden. I recommend continued skilled ST, goals updated, recert completed    Speech Therapy Frequency  2x / week    Duration --   20 weeks or 40 visits   Treatment/Interventions Environmental controls;Cueing hierarchy;SLP instruction and feedback;Compensatory strategies;Functional tasks;Cognitive reorganization;Compensatory techniques;Internal/external aids;Multimodal communcation approach;Patient/family education;Language facilitation    Potential to Achieve Goals Good           Patient will benefit from skilled therapeutic intervention in order to improve the following deficits and impairments:   Aphasia  Verbal apraxia    Problem List Patient Active Problem List   Diagnosis Date Noted  . Benign essential HTN 06/09/2020  . Abnormality of gait 04/28/2020  . Neurogenic bladder 03/17/2020  . Sepsis due to pneumonia ([place]) 03/06/2020  . History of stroke with residual deficit 03/06/2020  . Transient hypotension 03/06/2020  . Spastic hemiplegia affecting nondominant side ([place])   . History of hypertension   . Expressive aphasia   . AKI (acute kidney injury) ([place])   . Global aphasia   . Hypoalbuminemia due to protein-calorie malnutrition ([place])   . Dysphagia, post-stroke   . Hyperlipidemia 02/02/2020  . Dysphagia following cerebral infarction 02/02/2020  . Aspiration pneumonia ([place]) 02/02/2020  . Acute ischemic left MCA stroke ([place]) 02/02/2020  . Acute respiratory failure ([place])   . Acute ischemic stroke [facility]) s/p clot retrieval L MCA & ACA A3 01/27/2020  . Aspiration into airway 01/27/2020  . Chronic anticoagulation 01/27/2020  . Paroxysmal atrial fibrillation ([place]) 06/15/2018  . Essential hypertension 06/15/2018  . NICM (nonischemic cardiomyopathy) ([place]) 06/15/2018  . Visit for monitoring Tikosyn therapy 06/12/2018    Manpreet Strey, Annye Rusk MS, CCC-SLP 07/25/2020, 12:25 PM  [facility] [address] [place], [place], [address] Phone: [phone]   Fax:  [phone]   Name: Dustin Schneider MRN: [id number] Date  of Birth:  [birth year]

## 2020-07-25 NOTE — Therapy (Signed)
[facility] [address] [place], [place], [address] Phone: [phone]   Fax:  [phone]  Physical Therapy Treatment  Patient Details  Name: Dustin Schneider MRN: [id number] Date of Birth: [birth year] Referring Provider (PT): Lauraine Rinne, PA-C (followed by Dr. Delice Lesch)   Encounter Date: 07/25/2020   PT End of Session - 07/25/20 0936    Visit Number 39    Number of Visits 48    Date for PT Re-Evaluation 09/01/20    Authorization Type BCBS    PT Start Time 0845    PT Stop Time 0930    PT Time Calculation (min) 45 min    Equipment Utilized During Treatment Gait belt    Activity Tolerance Patient tolerated treatment well    Behavior During Therapy [facility] for tasks assessed/performed           Past Medical History:  Diagnosis Date   Hypertension    NICM (nonischemic cardiomyopathy) ([place]) 06/15/2018   04/23/18: EF 45%, 09/15/18: NOrmal LVEF   Paroxysmal atrial fibrillation ([place]) 06/15/2018   Stroke ([place])    Visit for monitoring Tikosyn therapy 06/12/2018    Past Surgical History:  Procedure Laterality Date   BUBBLE STUDY  03/11/2020   Procedure: BUBBLE STUDY;  Surgeon: [name], MD;  Location: [facility];  Service: Cardiovascular;;   CARDIOVERSION N/A 05/13/2018   Procedure: CARDIOVERSION;  Surgeon: [name], MD;  Location: [facility];  Service: Cardiovascular;  Laterality: N/A;   CARDIOVERSION N/A 06/13/2018   Procedure: CARDIOVERSION;  Surgeon: [name], MD;  Location: [facility] ENDOSCOPY;  Service: Cardiovascular;  Laterality: N/A;   CARDIOVERSION N/A 06/23/2018   Procedure: CARDIOVERSION;  Surgeon: [name], MD;  Location: [facility];  Service: Cardiovascular;  Laterality: N/A;   CARDIOVERSION N/A 03/11/2020   Procedure: CARDIOVERSION;  Surgeon: [name], MD;  Location: [facility];  Service: Cardiovascular;  Laterality: N/A;   excision of actinic keratosis     EYE SURGERY      eye lift   IR CT HEAD LTD  01/27/2020   IR PERCUTANEOUS ART THROMBECTOMY/INFUSION INTRACRANIAL INC DIAG ANGIO  01/27/2020       IR PERCUTANEOUS ART THROMBECTOMY/INFUSION INTRACRANIAL INC DIAG ANGIO  01/27/2020   RADIOLOGY WITH ANESTHESIA N/A 01/27/2020   Procedure: IR WITH ANESTHESIA;  Surgeon: Radiologist, Medication, MD;  Location: [facility];  Service: Radiology;  Laterality: N/A;   TEE WITHOUT CARDIOVERSION N/A 03/11/2020   Procedure: TRANSESOPHAGEAL ECHOCARDIOGRAM (TEE);  Surgeon: [name], MD;  Location: [facility];  Service: Cardiovascular;  Laterality: N/A;   TONSILLECTOMY     WISDOM TOOTH EXTRACTION      There were no vitals filed for this visit.   Subjective Assessment - 07/25/20 0850    Subjective Reports he has gotten better at walking. No issues with AFO.    Pertinent History PMH: HTN, paroxysmal a fib    Limitations Standing;Walking    Patient Stated Goals "wants everything working on R side again"    Currently in Pain? No/denies                             [facility] Adult PT Treatment/Exercise - 07/25/20 0909      Transfers   Transfers Sit to Stand;Stand to Sit    Sit to Stand 5: Supervision;With upper extremity assist;From chair/3-in-1    Stand to Sit 5: Supervision;With upper extremity assist;To chair/3-in-1      Ambulation/Gait   Ambulation/Gait  Yes    Ambulation/Gait Assistance 4: Min guard;4: Min assist    Ambulation/Gait Assistance Details ambulated back into clinic today with RW. ambulated a bout of 345' with cues for focusing on R foot clearance, practiced 115' of pt scanning environment (looking L/R) with pt needing to slow down gait speed and need min A at times for balance. during end of bout of gait pt demonstrating incr R foot scuffing/decr foot clearance, discussed with pt/pt's gf Dustin Schneider potentially pt needing a leather toe cap (from [employer]) to help assist with foot clearance when pt is more fatigued as pt also has mainly wood/tile surfaces  at home.    Ambulation Distance (Feet) 115 Feet   x1, 345 x 1   Assistive device Rolling walker    Gait Pattern Step-through pattern;Decreased hip/knee flexion - right    Ambulation Surface Level;Indoor    Gait Comments performed 4 x 40' gait training between cones/obstacles (weaving in and out) and then ambulating over red compliant mat with min A at times, cues for erect posture esp towards end, slowed pace, and foot placement      Neuro Re-ed    Neuro Re-ed Details  at staircase: modified SLS with RLE on floor and LLE on 6" step performed without UE support, cues for posture and glute/quad activation on RLE, 2 x 5 reps head nods, 2 x 5 reps head turns with min guard/min A, then performing LLE taps to 6" step with fingertip support with LLE x10 reps, cues for posture with RLE throughout, min A      Knee/Hip Exercises: Standing   Forward Step Up Right;Left;2 sets;10 reps    Forward Step Up Limitations with RLE planted on 6" step throughout and BUE support 2 x 10 reps with cues for glute activation/tall posture when standing on step, then performed x10 reps LLE step up and then stepping down with RLE, cues for incr R foot clearance when stepping off step                  PT Education - 07/25/20 0938    Education Details getting scheduled for additional visits throughout january    Person(s) Educated Patient   gf Dustin Schneider   Methods Explanation    Comprehension Verbalized understanding            PT Short Term Goals - 07/01/20 1552      PT SHORT TERM GOAL #1   Title Pt will be mod I with all transfers with use of right AFO once he receives it for improved mobility.    Baseline Mod-I with all transfers 07/01/20    Time 4    Period Weeks    Status Achieved    Target Date 07/03/20      PT SHORT TERM GOAL #2   Title Pt will be able to ambulate > 345' with RW with right AFO and hand grip attachment min assist for improved mobility.    Baseline 345' with RW w/ hand grip and R AFO  with min A    Time 4    Period Weeks    Status Achieved    Target Date 07/03/20      PT SHORT TERM GOAL #3   Title Pt will decrease 5 x sit to stand from 17.98 sec to <15 sec for improved balance and functional strength.    Baseline 06/03/20 17.98 sec from mat with hand; 16.4 s with hands from mat 07/01/20    Time 4  Period Weeks    Status On-going    Target Date 07/31/20      PT SHORT TERM GOAL #4   Title Pt will be instructed in proper AFO wear when new brace received.    Time 4    Period Weeks    Status New    Target Date 07/31/20             PT Long Term Goals - 06/03/20 1057      PT LONG TERM GOAL #1   Title Pt and pt's family members will be independent with final HEP in order to build upon functional gains made in therapy. ALL LTGS DUE 09/01/20    Baseline Pt has been performing current HEP with family but PT continues to add to it 06/03/20    Time 12    Period Weeks    Status On-going    Target Date 09/01/20      PT LONG TERM GOAL #2   Title Pt will ambulate >500' on varied surfaces supervision with RW and R AFO for improved household mobility and short community distances.    Baseline 230' with RW with right hand grip attachment and R AFO min assist on 06/03/20    Time 12    Period Weeks    Status Revised    Target Date 09/01/20      PT LONG TERM GOAL #3   Title Pt will decrease TUG from 27 sec to <20 sec for improved balance and decreased fall risk.    Baseline 06/03/20 TUG=27 sec with RW    Time 12    Period Weeks    Status New    Target Date 09/01/20      PT LONG TERM GOAL #4   Title Pt will be able to ambulate up/down curb with RW and R AFO CGA for improved community access.    Time 12    Period Weeks    Status New    Target Date 09/01/20                 Plan - 07/25/20 0940    Clinical Impression Statement Today's skilled session continued to focus on gait with RW/AFO/R hand orthosis with incr distances for endurance and with  obstacle negotiation/over compliat mat surface. Min A needed at times for balance. Pt with 2 episodes of decr R foot clearance today when fatigued. Rest breaks taken as needed. Remainder of session focused on RLE strengthening with step ups and modified SLS activities with RLE with decr UE support. Will continue to progress towards LTGs.    Personal Factors and Comorbidities Comorbidity 3+    Comorbidities dense L MCA CVA, paroxysmal A. fib on chronic Xarelto, nonischemic cardiomyopathy and hypertension.    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 continue gait with RW in clinic - walking into clinic from waiting room, obstacle negotiation and outdoors when weather permits and up/down inclines. hip extensor strengthening RLE, continue step ups. weight shifting/NMR to RLE. continue  with  balance on compliant surfaces.    PT Home Exercise Plan on 06/28/20 - added standing at [place] and stepping LLE out and in    Consulted and Agree with Plan of Care Patient;Family member/caregiver    Family Member Consulted gf, Dustin Schneider           Patient will benefit from skilled therapeutic intervention in order to improve the following  deficits and impairments:  Abnormal gait,Decreased activity tolerance,Decreased balance,Decreased cognition,Decreased mobility,Decreased coordination,Decreased range of motion,Decreased endurance,Decreased strength,Hypomobility,Difficulty walking,Impaired UE functional use,Impaired vision/preception,Postural dysfunction,Impaired sensation  Visit Diagnosis: Hemiplegia and hemiparesis following cerebral infarction affecting right dominant side ([place])  Other lack of coordination  Muscle weakness (generalized)  Other abnormalities of gait and mobility  Unsteadiness on feet  Difficulty in walking, not elsewhere classified     Problem List Patient Active Problem List   Diagnosis Date Noted   Benign essential HTN 06/09/2020   Abnormality of gait 04/28/2020   Neurogenic bladder 03/17/2020   Sepsis due to pneumonia ([place]) 03/06/2020   History of stroke with residual deficit 03/06/2020   Transient hypotension 03/06/2020   Spastic hemiplegia affecting nondominant side ([place])    History of hypertension    Expressive aphasia    AKI (acute kidney injury) ([place])    Global aphasia    Hypoalbuminemia due to protein-calorie malnutrition ([place])    Dysphagia, post-stroke    Hyperlipidemia 02/02/2020   Dysphagia following cerebral infarction 02/02/2020   Aspiration pneumonia ([place]) 02/02/2020   Acute ischemic left MCA stroke ([place]) 02/02/2020   Acute respiratory failure ([place])    Acute ischemic stroke ([place]) s/p clot retrieval L MCA & ACA A3 01/27/2020   Aspiration into airway 01/27/2020   Chronic anticoagulation 01/27/2020   Paroxysmal atrial fibrillation ([place]) 06/15/2018   Essential hypertension 06/15/2018   NICM (nonischemic cardiomyopathy) ([place]) 06/15/2018   Visit for monitoring Tikosyn therapy 06/12/2018    Arliss Journey, PT, DPT  07/25/2020, 9:52 AM  [facility] [address] [place], [place],  [address] Phone: [phone]   Fax:  [phone]  Name: KOSTANTINOS TALLMAN MRN: [id number] Date of Birth: 04-Jun-1956

## 2020-07-25 NOTE — Therapy (Signed)
[facility] [address] [place], [place], [address] Phone: [phone]   Fax:  [phone]  Occupational Therapy Treatment  Patient Details  Name: Dustin Schneider MRN: [id number] Date of Birth: 05/19/1956 No data recorded  Encounter Date: 07/25/2020   OT End of Session - 07/25/20 1239    Visit Number 35    Number of Visits 41    Date for OT Re-Evaluation 08/19/20    Authorization Type BC/BS - No auth required, covered 100%    Authorization Time Period WEEK 6/8    OT Start Time 0930    OT Stop Time 1015    OT Time Calculation (min) 45 min    Activity Tolerance Patient tolerated treatment well    Behavior During Therapy [facility] for tasks assessed/performed           Past Medical History:  Diagnosis Date  . Hypertension   . NICM (nonischemic cardiomyopathy) ([place]) 06/15/2018   04/23/18: EF 45%, 09/15/18: NOrmal LVEF  . Paroxysmal atrial fibrillation ([place]) 06/15/2018  . Stroke ([place])   . Visit for monitoring Tikosyn therapy 06/12/2018    Past Surgical History:  Procedure Laterality Date  . BUBBLE STUDY  03/11/2020   Procedure: BUBBLE STUDY;  Surgeon: [name], MD;  Location: [facility];  Service: Cardiovascular;;  . CARDIOVERSION N/A 05/13/2018   Procedure: CARDIOVERSION;  Surgeon: [name], MD;  Location: [facility] ENDOSCOPY;  Service: Cardiovascular;  Laterality: N/A;  . CARDIOVERSION N/A 06/13/2018   Procedure: CARDIOVERSION;  Surgeon: [name], MD;  Location: [facility] ENDOSCOPY;  Service: Cardiovascular;  Laterality: N/A;  . CARDIOVERSION N/A 06/23/2018   Procedure: CARDIOVERSION;  Surgeon: [name], MD;  Location: [facility] ENDOSCOPY;  Service: Cardiovascular;  Laterality: N/A;  . CARDIOVERSION N/A 03/11/2020   Procedure: CARDIOVERSION;  Surgeon: [name], MD;  Location: [facility];  Service: Cardiovascular;  Laterality: N/A;  . excision of actinic keratosis    . EYE SURGERY     eye lift  . IR CT HEAD LTD   01/27/2020  . IR PERCUTANEOUS ART THROMBECTOMY/INFUSION INTRACRANIAL INC DIAG ANGIO  01/27/2020      . IR PERCUTANEOUS ART THROMBECTOMY/INFUSION INTRACRANIAL INC DIAG ANGIO  01/27/2020  . RADIOLOGY WITH ANESTHESIA N/A 01/27/2020   Procedure: IR WITH ANESTHESIA;  Surgeon: Radiologist, Medication, MD;  Location: [facility];  Service: Radiology;  Laterality: N/A;  . TEE WITHOUT CARDIOVERSION N/A 03/11/2020   Procedure: TRANSESOPHAGEAL ECHOCARDIOGRAM (TEE);  Surgeon: [name], MD;  Location: [facility];  Service: Cardiovascular;  Laterality: N/A;  . TONSILLECTOMY    . WISDOM TOOTH EXTRACTION      There were no vitals filed for this visit.   Subjective Assessment - 07/25/20 0958    Subjective  Pt feels good about set up of estim for home    Patient is accompanied by: Family member    Pertinent History Lt MCA CVA 01/27/20, readmitted to hospital with HAP and fever on 03/06/20 and released 03/11/20. PMH: paroxsymal A-fib, HTN    Limitations fall    Currently in Pain? No/denies           Pt brought in estim unit they had purchased for home use. Pt/caregiver instructed in proper set up including electrode placement, how to set up and change parameters, precautions, and how to make electrodes last longer.  NMES x 15 minutes (with home device) for wrist and finger extensors with ramp 2 sec, 10 sec. on/off cycle, 50 rate, 250 pulse width.  OT Education - 07/25/20 1008    Education Details Home estim unit device and how to apply and operate correctly    Person(s) Educated Patient;Caregiver(s)    Methods Explanation;Demonstration;Verbal cues;Handout    Comprehension Verbalized understanding            OT Short Term Goals - 06/13/20 1245      OT SHORT TERM GOAL #1   Title Independent with initial HEP    Time 4    Period Weeks    Status Achieved      OT SHORT TERM GOAL #2   Title Pt to perform low to mid level reaching of light objects RUE in prep for  feeding self and bathing    Time 4    Period Weeks    Status Partially Met   low level, inconsistent mid level     OT SHORT TERM GOAL #3   Title Pt to feed self 50% of the time with Rt dominant hand and A/E prn    Time 4    Period Weeks    Status On-going   06/13/20: Pt reports eating yogurt/breakfast and some finger foods, but using Lt hand for lunch/dinner)     OT SHORT TERM GOAL #4   Title Pt to perform UE dressing of pullover shirt mod I level    Time 4    Period Weeks    Status Achieved   met per pt and brother     OT SHORT TERM GOAL #5   Title Pt to perform LE dressing and all bathing with no more than mod assist using A/E and strategies prn    Time 4    Period Weeks    Status Achieved   04/20/20:  set-up for bedside bath, mod A for LE dressing     OT SHORT TERM GOAL #6   Title Pt to perform toileting and toilet transfers with no more than min assist overall    Time 4    Period Weeks    Status Achieved   supervision     OT SHORT TERM GOAL #7   Title Improve functional use of RUE as evidenced by performing 8 blocks on Box & Blocks test    Baseline 0    Time 4    Period Weeks    Status Achieved   04/20/20:  6 blocks, 05/05/20: 12 blocks            OT Long Term Goals - 06/13/20 1248      OT LONG TERM GOAL #1   Title Pt to be independent with updated HEP    Time 12    Period Weeks    Status On-going      OT LONG TERM GOAL #2   Title Pt to perform all BADLS at mod I level    Time 12    Period Weeks    Status On-going   min assist for LE dressing     OT LONG TERM GOAL #3   Title Pt to feed self 50% or greater with Rt dominant hand and grooming 25% or more with Rt hand    Time 12    Period Weeks    Status Revised      OT LONG TERM GOAL #4   Title Pt to retrieve light weight objects from high shelf RUE consistently    Time 12    Period Weeks    Status On-going      OT LONG TERM GOAL #  5   Title Pt to demo 25 lbs grip strength or more Rt dominant hand for  gripping, opening jars/containers    Time 12    Period Weeks    Status On-going   12.5 lbs     OT LONG TERM GOAL #6   Title Pt to perform simple meal prep and light cleaning tasks mod I level    Time 12    Period Weeks    Status Deferred   deferred d/t inability to stand/ambulate without assist and AFO     OT LONG TERM GOAL #7   Title Improve functional use RUE as evidenced by performing 25 blocks on Box & Blocks    Time 12    Period Weeks    Status Revised   12 blocks     OT LONG TERM GOAL #8   Title Pt to returned to modified art/sculpting tasks with task modifications and A/E prn    Time 12    Period Weeks    Status Deferred                 Plan - 07/25/20 1240    Clinical Impression Statement Pt/caregiver verbalized understanding with operating estim unit for home use.    Occupational performance deficits (Please refer to evaluation for details): ADL's;IADL's;Work;Leisure;Social Participation    Body Structure / Function / Physical Skills ADL;ROM;Dexterity;Balance;IADL;Body mechanics;Improper spinal/pelvic alignment;Sensation;Mobility;Flexibility;Strength;Coordination;FMC;Tone;UE functional use;Decreased knowledge of use of DME;Proprioception    Rehab Potential Good    Comorbidities Affecting Occupational Performance: May have comorbidities impacting occupational performance    OT Frequency 2x / week    OT Duration 8 weeks   additional 8 weeks beginning week of 06/20/20   OT Treatment/Interventions Self-care/ADL training;Therapeutic exercise;Functional Mobility Training;Neuromuscular education;Manual Therapy;Splinting;Aquatic Therapy;Manual lymph drainage;Therapeutic activities;Coping strategies training;DME and/or AE instruction;Electrical Stimulation;Fluidtherapy;Passive range of motion;Patient/family education    Plan check on how estim is going at home, progress towards remaining goals in prep for d/c by end of December.    Consulted and Agree with Plan of Care  Patient    Family Member Consulted brother           Patient will benefit from skilled therapeutic intervention in order to improve the following deficits and impairments:   Body Structure / Function / Physical Skills: ADL,ROM,Dexterity,Balance,IADL,Body mechanics,Improper spinal/pelvic alignment,Sensation,Mobility,Flexibility,Strength,Coordination,FMC,Tone,UE functional use,Decreased knowledge of use of DME,Proprioception       Visit Diagnosis: Hemiplegia and hemiparesis following cerebral infarction affecting right dominant side [facility])    Problem List Patient Active Problem List   Diagnosis Date Noted  . Benign essential HTN 06/09/2020  . Abnormality of gait 04/28/2020  . Neurogenic bladder 03/17/2020  . Sepsis due to pneumonia ([place]) 03/06/2020  . History of stroke with residual deficit 03/06/2020  . Transient hypotension 03/06/2020  . Spastic hemiplegia affecting nondominant side ([place])   . History of hypertension   . Expressive aphasia   . AKI (acute kidney injury) ([place])   . Global aphasia   . Hypoalbuminemia due to protein-calorie malnutrition ([place])   . Dysphagia, post-stroke   . Hyperlipidemia 02/02/2020  . Dysphagia following cerebral infarction 02/02/2020  . Aspiration pneumonia ([place]) 02/02/2020  . Acute ischemic left MCA stroke ([place]) 02/02/2020  . Acute respiratory failure ([place])   . Acute ischemic stroke [facility]) s/p clot retrieval L MCA & ACA A3 01/27/2020  . Aspiration into airway 01/27/2020  . Chronic anticoagulation 01/27/2020  . Paroxysmal atrial fibrillation ([place]) 06/15/2018  . Essential hypertension 06/15/2018  . NICM (nonischemic cardiomyopathy) ([place])  06/15/2018  . Visit for monitoring Tikosyn therapy 06/12/2018    Carey Bullocks, OTR/L 07/25/2020, 12:42 PM  [facility] [address] [place], [place], [address] Phone: [phone]   Fax:  [phone]  Name: Dustin Schneider MRN:  [id number] Date of Birth: [birth year]

## 2020-07-25 NOTE — Patient Instructions (Signed)
  Setting for home e-stim unit:   Mode: Synchronous Ramp: 2 seconds On time: 10 sec. Off time: 10 sec. Pulse width: 250 Rate: 50 Hz Time: 15-30 minutes (start with 15 min, 2x/day, then can gradually increase to 30 minutes, 2x/day) - do NOT ever exceed 30 minutes at one time.   **Only turn up higher when during "on" cycle **Make sure you turn completely off with screen blank before removing electrodes

## 2020-07-28 ENCOUNTER — Other Ambulatory Visit: Payer: Self-pay

## 2020-07-28 ENCOUNTER — Ambulatory Visit: Payer: BC Managed Care – PPO | Admitting: Occupational Therapy

## 2020-07-28 ENCOUNTER — Ambulatory Visit: Payer: BC Managed Care – PPO | Admitting: Physical Therapy

## 2020-07-28 ENCOUNTER — Other Ambulatory Visit: Payer: Self-pay | Admitting: Cardiology

## 2020-07-28 ENCOUNTER — Encounter: Payer: Self-pay | Admitting: Physical Therapy

## 2020-07-28 DIAGNOSIS — R278 Other lack of coordination: Secondary | ICD-10-CM

## 2020-07-28 DIAGNOSIS — I69351 Hemiplegia and hemiparesis following cerebral infarction affecting right dominant side: Secondary | ICD-10-CM | POA: Diagnosis not present

## 2020-07-28 DIAGNOSIS — R2681 Unsteadiness on feet: Secondary | ICD-10-CM

## 2020-07-28 DIAGNOSIS — R262 Difficulty in walking, not elsewhere classified: Secondary | ICD-10-CM

## 2020-07-28 DIAGNOSIS — R29818 Other symptoms and signs involving the nervous system: Secondary | ICD-10-CM

## 2020-07-28 NOTE — Therapy (Signed)
[facility] [address] [place], [place], [address] Phone: [phone]   Fax:  [phone]  Physical Therapy Treatment  Patient Details  Name: Dustin Schneider MRN: [id number] Date of Birth: [birth year] Referring Provider (PT): Lauraine Rinne, PA-C (followed by Dr. Delice Lesch)   Encounter Date: 07/28/2020   PT End of Session - 07/28/20 1048    Visit Number 40    Number of Visits 48    Date for PT Re-Evaluation 09/01/20    Authorization Type BCBS    PT Start Time 0845    PT Stop Time 0930    PT Time Calculation (min) 45 min    Equipment Utilized During Treatment Gait belt    Activity Tolerance Patient tolerated treatment well    Behavior During Therapy [facility] for tasks assessed/performed           Past Medical History:  Diagnosis Date  . Hypertension   . NICM (nonischemic cardiomyopathy) ([place]) 06/15/2018   04/23/18: EF 45%, 09/15/18: NOrmal LVEF  . Paroxysmal atrial fibrillation ([place]) 06/15/2018  . Stroke ([place])   . Visit for monitoring Tikosyn therapy 06/12/2018    Past Surgical History:  Procedure Laterality Date  . BUBBLE STUDY  03/11/2020   Procedure: BUBBLE STUDY;  Surgeon: [name], MD;  Location: [facility];  Service: Cardiovascular;;  . CARDIOVERSION N/A 05/13/2018   Procedure: CARDIOVERSION;  Surgeon: [name], MD;  Location: [facility] ENDOSCOPY;  Service: Cardiovascular;  Laterality: N/A;  . CARDIOVERSION N/A 06/13/2018   Procedure: CARDIOVERSION;  Surgeon: [name], MD;  Location: [facility] ENDOSCOPY;  Service: Cardiovascular;  Laterality: N/A;  . CARDIOVERSION N/A 06/23/2018   Procedure: CARDIOVERSION;  Surgeon: [name], MD;  Location: [facility] ENDOSCOPY;  Service: Cardiovascular;  Laterality: N/A;  . CARDIOVERSION N/A 03/11/2020   Procedure: CARDIOVERSION;  Surgeon: [name], MD;  Location: [facility] [place];  Service: Cardiovascular;  Laterality: N/A;  . excision of actinic keratosis    . EYE SURGERY      eye lift  . IR CT HEAD LTD  01/27/2020  . IR PERCUTANEOUS ART THROMBECTOMY/INFUSION INTRACRANIAL INC DIAG ANGIO  01/27/2020      . IR PERCUTANEOUS ART THROMBECTOMY/INFUSION INTRACRANIAL INC DIAG ANGIO  01/27/2020  . RADIOLOGY WITH ANESTHESIA N/A 01/27/2020   Procedure: IR WITH ANESTHESIA;  Surgeon: Radiologist, Medication, MD;  Location: [facility];  Service: Radiology;  Laterality: N/A;  . TEE WITHOUT CARDIOVERSION N/A 03/11/2020   Procedure: TRANSESOPHAGEAL ECHOCARDIOGRAM (TEE);  Surgeon: [name], MD;  Location: [facility];  Service: Cardiovascular;  Laterality: N/A;  . TONSILLECTOMY    . WISDOM TOOTH EXTRACTION      There were no vitals filed for this visit.   Subjective Assessment - 07/28/20 0851    Subjective Went grocery shopping the other day and made a list. Sees [facility] later for a follow up for his brace just to make sure everything is good. Sees the neurologist on monday.    Pertinent History PMH: HTN, paroxysmal a fib    Limitations Standing;Walking    Patient Stated Goals "wants everything working on R side again"    Currently in Pain? No/denies                             [facility] Adult PT Treatment/Exercise - 07/28/20 0857      Transfers   Transfers Sit to Stand;Stand to Sit    Sit to Stand 5: Supervision;With upper extremity assist;From chair/3-in-1;4:  Min guard;Without upper extremity assist    Five time sit to stand comments  12.75 seconds with LUE support from mat, supervision, 13.6 seconds no UE support    Stand to Sit 5: Supervision;4: Min guard;With upper extremity assist;Without upper extremity assist    Comments initially performed 5 reps sit <> stands with no UE support with pt demonstrating BLE bracing against mat table and improper foot placement of R foot (needing min guard), educated pt on proper technique with nose over toes and foot placement, performed an additional 2 x 5 reps with no UE support with supervision - cues for tall posture in  standing      Ambulation/Gait   Ambulation/Gait Yes    Ambulation/Gait Assistance 4: Min guard;4: Min assist    Ambulation/Gait Assistance Details ambulated back into clinic with RW, with focus throughout gait bouts on R foot placement, therapist assisting at times for incr anterior pelvic rotation on R    Ambulation Distance (Feet) 230 Feet   x1, 100 x 1   Assistive device Rolling walker    Gait Pattern Step-through pattern;Decreased hip/knee flexion - right    Ambulation Surface Level;Indoor    Gait velocity 16.38 seconds = 2.00 ft/sec    Gait Comments with RW stepping over 7 boomwhackers for obstacle negotiation (over a 40' space) with pt unable to perform with stepping over first with LLE as RLE would get stuck and knock boomwhacker, performed an additional 2 reps down and back with focus on stepping over with RLE for improved foot clearance and cues for heel strike when stepping over - min A for balance. then in // bars reciprocal stepping over 4 yardsticks down and back x4 reps, cues for slowed and controlled and intermittent min A when stepping R foot over      Knee/Hip Exercises: Standing   Lateral Step Up 1 set;10 reps;Step Height: 4";Hand Hold: 2;Right    Lateral Step Up Limitations floating non-stance leg, cues for posture and glute activation in stance on RLE, cues for eccentric control when lowering down to floor    Forward Step Up Right;1 set;Step Height: 4";Hand Hold: 1;Other (comment)   12 reps   Forward Step Up Limitations cues for fingertip support, tall posture and glute activation when stepping LLE up (having R foot planted on step), attemptd a couple reps with no UE support and min A with pt with decr control                    PT Short Term Goals - 07/28/20 0855      PT SHORT TERM GOAL #1   Title Pt will be mod I with all transfers with use of right AFO once he receives it for improved mobility.    Baseline Mod-I with all transfers 07/01/20    Time 4     Period Weeks    Status Achieved    Target Date 07/03/20      PT SHORT TERM GOAL #2   Title Pt will be able to ambulate > 345' with RW with right AFO and hand grip attachment min assist for improved mobility.    Baseline 345' with RW w/ hand grip and R AFO with min A    Time 4    Period Weeks    Status Achieved    Target Date 07/03/20      PT SHORT TERM GOAL #3   Title Pt will decrease 5 x sit to stand from 17.98 sec to <  15 sec for improved balance and functional strength.    Baseline 06/03/20 17.98 sec from mat with hand; 16.4 s with hands from mat 07/01/20; 12.75 seconds with LUE support from mat, supervision, 13.6 seconds no UE support on 07/28/20    Time 4    Period Weeks    Status Achieved    Target Date 07/31/20      PT SHORT TERM GOAL #4   Title Pt will be instructed in proper AFO wear when new brace received.    Baseline met    Time 4    Period Weeks    Status Achieved    Target Date 07/31/20             PT Long Term Goals - 06/03/20 1057      PT LONG TERM GOAL #1   Title Pt and pt's family members will be independent with final HEP in order to build upon functional gains made in therapy. ALL LTGS DUE 09/01/20    Baseline Pt has been performing current HEP with family but PT continues to add to it 06/03/20    Time 12    Period Weeks    Status On-going    Target Date 09/01/20      PT LONG TERM GOAL #2   Title Pt will ambulate >500' on varied surfaces supervision with RW and R AFO for improved household mobility and short community distances.    Baseline 230' with RW with right hand grip attachment and R AFO min assist on 06/03/20    Time 12    Period Weeks    Status Revised    Target Date 09/01/20      PT LONG TERM GOAL #3   Title Pt will decrease TUG from 27 sec to <20 sec for improved balance and decreased fall risk.    Baseline 06/03/20 TUG=27 sec with RW    Time 12    Period Weeks    Status New    Target Date 09/01/20      PT LONG TERM GOAL #4    Title Pt will be able to ambulate up/down curb with RW and R AFO CGA for improved community access.    Time 12    Period Weeks    Status New    Target Date 09/01/20                 Plan - 07/28/20 1056    Clinical Impression Statement Checked remainder of STGs today with pt meeting STG #3 and #4. Pt improved 5x sit <> stand with single UE support to 12.75 seconds (previously 16.4 seconds) and performed with no UE support in 13.6 seconds. Pt initially need cues for proper technique without UE support to prevent BLE bracing against mat table. Remainder of session focused on RLE strengthening, gait training with RW and in // bars for obstacle negotiation and improved foot clearance. Will continue to progress towards LTGs.    Personal Factors and Comorbidities Comorbidity 3+    Comorbidities dense L MCA CVA, paroxysmal A. fib on chronic Xarelto, nonischemic cardiomyopathy and hypertension.    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, works as an Electrical engineer Evolving/Moderate complexity    Rehab Potential Good    PT Frequency 2x / week    PT Duration 12 weeks    PT Treatment/Interventions ADLs/Self Care Home Management;Aquatic Therapy;DME Instruction;Gait training;Stair training;Functional  mobility training;Therapeutic activities;Therapeutic exercise;Electrical Stimulation;Balance training;Neuromuscular re-education;Wheelchair mobility training;Orthotic Fit/Training;Patient/family education;Passive range of motion;Energy conservation    PT Next Visit Plan continue gait with RW in clinic - walking into clinic from waiting room, obstacle negotiation and outdoors when weather permits and up/down inclines. hip extensor strengthening RLE, continue step ups. weight shifting/NMR to  RLE. continue with  balance on compliant surfaces.    PT Home Exercise Plan on 06/28/20 - added standing at [place]et[place] and stepping LLE out and in    Consulted and Agree with Plan of Care Patient;Family member/caregiver    Family Member Consulted gf, [name]           Patient will benefit from skilled therapeutic intervention in order to improve the following deficits and impairments:  Abnormal gait,Decreased activity tolerance,Decreased balance,Decreased cognition,Decreased mobility,Decreased coordination,Decreased range of motion,Decreased endurance,Decreased strength,Hypomobility,Difficulty walking,Impaired UE functional use,Impaired vision/preception,Postural dysfunction,Impaired sensation  Visit Diagnosis: Hemiplegia and hemiparesis following cerebral infarction affecting right dominant side ([place])  Other lack of coordination  Difficulty in walking, not elsewhere classified  Other symptoms and signs involving the nervous system  Unsteadiness on feet     Problem List Patient Active Problem List   Diagnosis Date Noted  . Benign essential HTN 06/09/2020  . Abnormality of gait 04/28/2020  . Neurogenic bladder 03/17/2020  . Sepsis due to pneumonia ([place]) 03/06/2020  . History of stroke with residual deficit 03/06/2020  . Transient hypotension 03/06/2020  . Spastic hemiplegia affecting nondominant side ([place])   . History of hypertension   . Expressive aphasia   . AKI (acute kidney injury) ([place])   . Global aphasia   . Hypoalbuminemia due to protein-calorie malnutrition ([place])   . Dysphagia, post-stroke   . Hyperlipidemia 02/02/2020  . Dysphagia following cerebral infarction 02/02/2020  . Aspiration pneumonia ([place]) 02/02/2020  . Acute ischemic left MCA stroke ([place]) 02/02/2020  . Acute respiratory failure ([place])   . Acute ischemic stroke [facility]) s/p clot retrieval L MCA & ACA A3 01/27/2020  . Aspiration into airway 01/27/2020  . Chronic anticoagulation 01/27/2020  . Paroxysmal  atrial fibrillation ([place]) 06/15/2018  . Essential hypertension 06/15/2018  . NICM (nonischemic cardiomyopathy) ([place]) 06/15/2018  . Visit for monitoring Tikosyn therapy 06/12/2018    Arliss Journey, PT, DPT  07/28/2020, 10:59 AM  [facility] [address] [place], [place], [address] Phone: [phone]   Fax:  [phone]  Name: Dustin Schneider MRN: [id number] Date of Birth: 1956-01-17

## 2020-07-28 NOTE — Therapy (Signed)
[facility] [address] [place], [place], [address] Phone: [phone]   Fax:  [phone]  Occupational Therapy Treatment  Patient Details  Name: Dustin Schneider MRN: [id number] Date of Birth: 1955-10-14 No data recorded  Encounter Date: 07/28/2020   OT End of Session - 07/28/20 1029    Visit Number 36    Number of Visits 41    Date for OT Re-Evaluation 08/19/20    Authorization Type BC/BS - No auth required, covered 100%    Authorization Time Period WEEK 6/8    OT Start Time 0930    OT Stop Time 1015    OT Time Calculation (min) 45 min    Activity Tolerance Patient tolerated treatment well    Behavior During Therapy [facility] for tasks assessed/performed           Past Medical History:  Diagnosis Date  . Hypertension   . NICM (nonischemic cardiomyopathy) ([place]) 06/15/2018   04/23/18: EF 45%, 09/15/18: NOrmal LVEF  . Paroxysmal atrial fibrillation ([place]) 06/15/2018  . Stroke ([place])   . Visit for monitoring Tikosyn therapy 06/12/2018    Past Surgical History:  Procedure Laterality Date  . BUBBLE STUDY  03/11/2020   Procedure: BUBBLE STUDY;  Surgeon: [name], MD;  Location: [place];  Service: Cardiovascular;;  . CARDIOVERSION N/A 05/13/2018   Procedure: CARDIOVERSION;  Surgeon: [name], MD;  Location: [facility] ENDOSCOPY;  Service: Cardiovascular;  Laterality: N/A;  . CARDIOVERSION N/A 06/13/2018   Procedure: CARDIOVERSION;  Surgeon: [name], MD;  Location: [facility] ENDOSCOPY;  Service: Cardiovascular;  Laterality: N/A;  . CARDIOVERSION N/A 06/23/2018   Procedure: CARDIOVERSION;  Surgeon: [name], MD;  Location: [facility] ENDOSCOPY;  Service: Cardiovascular;  Laterality: N/A;  . CARDIOVERSION N/A 03/11/2020   Procedure: CARDIOVERSION;  Surgeon: [name], MD;  Location: [place];  Service: Cardiovascular;  Laterality: N/A;  . excision of actinic keratosis    . EYE SURGERY     eye lift  . IR CT HEAD LTD   01/27/2020  . IR PERCUTANEOUS ART THROMBECTOMY/INFUSION INTRACRANIAL INC DIAG ANGIO  01/27/2020      . IR PERCUTANEOUS ART THROMBECTOMY/INFUSION INTRACRANIAL INC DIAG ANGIO  01/27/2020  . RADIOLOGY WITH ANESTHESIA N/A 01/27/2020   Procedure: IR WITH ANESTHESIA;  Surgeon: Radiologist, Medication, MD;  Location: [place];  Service: Radiology;  Laterality: N/A;  . TEE WITHOUT CARDIOVERSION N/A 03/11/2020   Procedure: TRANSESOPHAGEAL ECHOCARDIOGRAM (TEE);  Surgeon: [name], MD;  Location: [place];  Service: Cardiovascular;  Laterality: N/A;  . TONSILLECTOMY    . WISDOM TOOTH EXTRACTION      There were no vitals filed for this visit.   Subjective Assessment - 07/28/20 0934    Subjective  Estim unit going well per pt/family report    Patient is accompanied by: Family member    Pertinent History Lt MCA CVA 01/27/20, readmitted to hospital with HAP and fever on 03/06/20 and released 03/11/20. PMH: paroxsymal A-fib, HTN    Limitations fall    Currently in Pain? No/denies           Practiced simple functional use Rt hand - picking up 1" blocks and putting in bowl (low tabletop level) and placing checkers into Connect 4 slots with 3 drops Rt hand.   Began assessing LTG's - pt has had decrease in grip strength from 12.5 lbs to 1.7 lbs. This decrease may be possibly due to botox. Pt encouraged to resume putty ex's and issued new yellow putty  as pt no longer had previous putty.  Pt shown A/E for LH sponge with inserts to clean b/t toes to increase independence with bathing. Pt also issued handouts for walker tray and walker basket options.  Practiced functional standing and ambulation in kitchen w/ supervision and use of gait belt - practiced getting things out of refrigerator and cabinet w/ cues for balance, safety, side stepping along counter, etc.                        OT Short Term Goals - 07/28/20 0951      OT SHORT TERM GOAL #1   Title Independent with initial HEP    Time  4    Period Weeks    Status Achieved      OT SHORT TERM GOAL #2   Title Pt to perform low to mid level reaching of light objects RUE in prep for feeding self and bathing    Time 4    Period Weeks    Status Partially Met   low level, inconsistent mid level     OT SHORT TERM GOAL #3   Title Pt to feed self 50% of the time with Rt dominant hand and A/E prn    Time 4    Period Weeks    Status Not Met   07/28/20: 25%     OT SHORT TERM GOAL #4   Title Pt to perform UE dressing of pullover shirt mod I level    Time 4    Period Weeks    Status Achieved   met per pt and brother     OT SHORT TERM GOAL #5   Title Pt to perform LE dressing and all bathing with no more than mod assist using A/E and strategies prn    Time 4    Period Weeks    Status Achieved   04/20/20:  set-up for bedside bath, mod A for LE dressing     OT SHORT TERM GOAL #6   Title Pt to perform toileting and toilet transfers with no more than min assist overall    Time 4    Period Weeks    Status Achieved   supervision     OT SHORT TERM GOAL #7   Title Improve functional use of RUE as evidenced by performing 8 blocks on Box & Blocks test    Baseline 0    Time 4    Period Weeks    Status Achieved   04/20/20:  6 blocks, 05/05/20: 12 blocks            OT Long Term Goals - 07/28/20 0952      OT LONG TERM GOAL #1   Title Pt to be independent with updated HEP    Time 12    Period Weeks    Status Achieved      OT LONG TERM GOAL #2   Title Pt to perform all BADLS at mod I level    Time 12    Period Weeks    Status Not Met   07/28/20: 50% bathing; Min A for dressing, donning R shoe     OT LONG TERM GOAL #3   Title Pt to feed self 50% or greater with Rt dominant hand and grooming 25% or more with Rt hand    Time 12    Period Weeks    Status Not Met   07/28/20: 25% self-feeding w/ R hand; grooming w/  L hand     OT LONG TERM GOAL #4   Title Pt to retrieve light weight objects from high shelf RUE consistently     Time 12    Period Weeks    Status Not Met   07/28/20: low to mid height only     OT LONG TERM GOAL #5   Title Pt to demo 25 lbs grip strength or more Rt dominant hand for gripping, opening jars/containers    Time 12    Period Weeks    Status Not Met   12.5 lbs; 1.7 lbs (07/28/20)     OT LONG TERM GOAL #6   Title Pt to perform simple meal prep and light cleaning tasks mod I level    Time 12    Period Weeks    Status Deferred   deferred d/t inability to stand/ambulate without assist and AFO     OT LONG TERM GOAL #7   Title Improve functional use RUE as evidenced by performing 25 blocks on Box & Blocks    Time 12    Period Weeks    Status Revised   12 blocks     OT LONG TERM GOAL #8   Title Pt to returned to modified art/sculpting tasks with task modifications and A/E prn    Time 12    Period Weeks    Status Deferred                 Plan - 07/28/20 1031    Clinical Impression Statement Pt doing well with home estim unit. Began assessing goals - see LTG section. Pt has lost grip strength    Occupational performance deficits (Please refer to evaluation for details): ADL's;IADL's;Work;Leisure;Social Participation    Body Structure / Function / Physical Skills ADL;ROM;Dexterity;Balance;IADL;Body mechanics;Improper spinal/pelvic alignment;Sensation;Mobility;Flexibility;Strength;Coordination;FMC;Tone;UE functional use;Decreased knowledge of use of DME;Proprioception    Rehab Potential Good    Comorbidities Affecting Occupational Performance: May have comorbidities impacting occupational performance    OT Frequency 2x / week    OT Duration 8 weeks   additional 8 weeks beginning week of 06/20/20   OT Treatment/Interventions Self-care/ADL training;Therapeutic exercise;Functional Mobility Training;Neuromuscular education;Manual Therapy;Splinting;Aquatic Therapy;Manual lymph drainage;Therapeutic activities;Coping strategies training;DME and/or AE instruction;Electrical  Stimulation;Fluidtherapy;Passive range of motion;Patient/family education    Plan continue to reinforce functional standing balance and kitchen negotiation with walker, simple functional use RUE    Consulted and Agree with Plan of Care Patient    Family Member Consulted brother           Patient will benefit from skilled therapeutic intervention in order to improve the following deficits and impairments:   Body Structure / Function / Physical Skills: ADL,ROM,Dexterity,Balance,IADL,Body mechanics,Improper spinal/pelvic alignment,Sensation,Mobility,Flexibility,Strength,Coordination,FMC,Tone,UE functional use,Decreased knowledge of use of DME,Proprioception       Visit Diagnosis: Hemiplegia and hemiparesis following cerebral infarction affecting right dominant side ([place])  Other lack of coordination  Unsteadiness on feet    Problem List Patient Active Problem List   Diagnosis Date Noted  . Benign essential HTN 06/09/2020  . Abnormality of gait 04/28/2020  . Neurogenic bladder 03/17/2020  . Sepsis due to pneumonia ([place]) 03/06/2020  . History of stroke with residual deficit 03/06/2020  . Transient hypotension 03/06/2020  . Spastic hemiplegia affecting nondominant side ([place])   . History of hypertension   . Expressive aphasia   . AKI (acute kidney injury) ([place])   . Global aphasia   . Hypoalbuminemia due to protein-calorie malnutrition ([place])   . Dysphagia, post-stroke   . Hyperlipidemia  02/02/2020  . Dysphagia following cerebral infarction 02/02/2020  . Aspiration pneumonia ([place]) 02/02/2020  . Acute ischemic left MCA stroke ([place]) 02/02/2020  . Acute respiratory failure ([place])   . Acute ischemic stroke [facility]) s/p clot retrieval L MCA & ACA A3 01/27/2020  . Aspiration into airway 01/27/2020  . Chronic anticoagulation 01/27/2020  . Paroxysmal atrial fibrillation ([place]) 06/15/2018  . Essential hypertension 06/15/2018  . NICM (nonischemic cardiomyopathy) ([place]) 06/15/2018  . Visit  for monitoring Tikosyn therapy 06/12/2018    Carey Bullocks, OTR/L 07/28/2020, 10:46 AM  [facility] [address] [place], [place], [address] Phone: [phone]   Fax:  [phone]  Name: Dustin Schneider MRN: [id number] Date of Birth: [birth year]

## 2020-08-01 ENCOUNTER — Encounter: Payer: Self-pay | Admitting: Adult Health

## 2020-08-01 ENCOUNTER — Ambulatory Visit (INDEPENDENT_AMBULATORY_CARE_PROVIDER_SITE_OTHER): Payer: BC Managed Care – PPO | Admitting: Adult Health

## 2020-08-01 ENCOUNTER — Ambulatory Visit: Payer: BC Managed Care – PPO | Admitting: Occupational Therapy

## 2020-08-01 ENCOUNTER — Other Ambulatory Visit: Payer: Self-pay | Admitting: Cardiology

## 2020-08-01 ENCOUNTER — Ambulatory Visit: Payer: BC Managed Care – PPO

## 2020-08-01 ENCOUNTER — Ambulatory Visit: Payer: BC Managed Care – PPO | Admitting: Physical Therapy

## 2020-08-01 ENCOUNTER — Encounter: Payer: Self-pay | Admitting: Physical Therapy

## 2020-08-01 ENCOUNTER — Other Ambulatory Visit: Payer: Self-pay

## 2020-08-01 VITALS — BP 128/75 | HR 54 | Ht 68.0 in | Wt 158.0 lb

## 2020-08-01 DIAGNOSIS — E782 Mixed hyperlipidemia: Secondary | ICD-10-CM | POA: Diagnosis not present

## 2020-08-01 DIAGNOSIS — R2681 Unsteadiness on feet: Secondary | ICD-10-CM

## 2020-08-01 DIAGNOSIS — Z8673 Personal history of transient ischemic attack (TIA), and cerebral infarction without residual deficits: Secondary | ICD-10-CM | POA: Diagnosis not present

## 2020-08-01 DIAGNOSIS — I10 Essential (primary) hypertension: Secondary | ICD-10-CM | POA: Diagnosis not present

## 2020-08-01 DIAGNOSIS — I480 Paroxysmal atrial fibrillation: Secondary | ICD-10-CM | POA: Diagnosis not present

## 2020-08-01 DIAGNOSIS — I69351 Hemiplegia and hemiparesis following cerebral infarction affecting right dominant side: Secondary | ICD-10-CM | POA: Diagnosis not present

## 2020-08-01 DIAGNOSIS — R278 Other lack of coordination: Secondary | ICD-10-CM

## 2020-08-01 DIAGNOSIS — R4701 Aphasia: Secondary | ICD-10-CM

## 2020-08-01 DIAGNOSIS — R262 Difficulty in walking, not elsewhere classified: Secondary | ICD-10-CM

## 2020-08-01 DIAGNOSIS — R29818 Other symptoms and signs involving the nervous system: Secondary | ICD-10-CM

## 2020-08-01 DIAGNOSIS — R482 Apraxia: Secondary | ICD-10-CM

## 2020-08-01 NOTE — Therapy (Signed)
[facility] [address] [place], [place], [address] Phone: [phone]   Fax:  [phone]  Physical Therapy Treatment  Patient Details  Name: Dustin Schneider MRN: [id number] Date of Birth: [birth year] Referring Provider (PT): Dustin Rinne, PA-C (followed by Dr. Delice Schneider)   Encounter Date: 08/01/2020   PT End of Session - 08/01/20 1356    Visit Number 41    Number of Visits 31    Date for PT Re-Evaluation 09/01/20    Authorization Type BCBS    PT Start Time 0932    PT Stop Time 1016    PT Time Calculation (min) 44 min    Equipment Utilized During Treatment Gait belt    Activity Tolerance Patient tolerated treatment well    Behavior During Therapy [facility] for tasks assessed/performed           Past Medical History:  Diagnosis Date   Hypertension    NICM (nonischemic cardiomyopathy) ([place]) 06/15/2018   04/23/18: EF 45%, 09/15/18: NOrmal LVEF   Paroxysmal atrial fibrillation ([place]) 06/15/2018   Stroke ([place])    Visit for monitoring Tikosyn therapy 06/12/2018    Past Surgical History:  Procedure Laterality Date   BUBBLE STUDY  03/11/2020   Procedure: BUBBLE STUDY;  Surgeon: Dustin Prows, Schneider;  Location: [place];  Service: Cardiovascular;;   CARDIOVERSION N/A 05/13/2018   Procedure: CARDIOVERSION;  Surgeon: Dustin Prows, Schneider;  Location: [place];  Service: Cardiovascular;  Laterality: N/A;   CARDIOVERSION N/A 06/13/2018   Procedure: CARDIOVERSION;  Surgeon: Dustin Hector, Schneider;  Location: [place];  Service: Cardiovascular;  Laterality: N/A;   CARDIOVERSION N/A 06/23/2018   Procedure: CARDIOVERSION;  Surgeon: Dustin Prows, Schneider;  Location: [place];  Service: Cardiovascular;  Laterality: N/A;   CARDIOVERSION N/A 03/11/2020   Procedure: CARDIOVERSION;  Surgeon: Dustin Prows, Schneider;  Location: [place];  Service: Cardiovascular;  Laterality: N/A;   excision of actinic keratosis     EYE SURGERY      eye lift   IR CT HEAD LTD  01/27/2020   IR PERCUTANEOUS ART THROMBECTOMY/INFUSION INTRACRANIAL INC DIAG ANGIO  01/27/2020       IR PERCUTANEOUS ART THROMBECTOMY/INFUSION INTRACRANIAL INC DIAG ANGIO  01/27/2020   RADIOLOGY WITH ANESTHESIA N/A 01/27/2020   Procedure: IR WITH ANESTHESIA;  Surgeon: Dustin Schneider;  Location: [place];  Service: Radiology;  Laterality: N/A;   TEE WITHOUT CARDIOVERSION N/A 03/11/2020   Procedure: TRANSESOPHAGEAL ECHOCARDIOGRAM (TEE);  Surgeon: Dustin Prows, Schneider;  Location: [place];  Service: Cardiovascular;  Laterality: N/A;   TONSILLECTOMY     WISDOM TOOTH EXTRACTION      There were no vitals filed for this visit.   Subjective Assessment - 08/01/20 0938    Subjective Did some drawing over the weekend with his L hand. Saw Dustin Schneider just now and states that everything went well. Saw [facility] this past week - everything going good with the brace, made it into an articulating brace by Dustin Schneider.    Pertinent History PMH: HTN, paroxysmal a fib    Limitations Standing;Walking    Patient Stated Goals "wants everything working on R side again"    Currently in Pain? No/denies                             [facility] Adult PT Treatment/Exercise - 08/01/20 1359      Transfers   Transfers Sit to Stand;Stand to Sit  Sit to Stand 5: Supervision;With upper extremity assist;From chair/3-in-1;4: Min guard    Stand to Sit 5: Supervision;With upper extremity assist      Ambulation/Gait   Ambulation/Gait Yes    Ambulation/Gait Assistance 4: Min guard;4: Min assist    Ambulation/Gait Assistance Details ambulated back into clinic from waiting room, performed 4 laps of gait around clinic with assist from therapist for anterior pelvic rotation on right and cues to push into therapist's hand on R ASIS for incr R hip flexion for improved foot clearance, pt with less instances of bumping RLE into side of walker. pt improved with incr reps    Ambulation  Distance (Feet) 460 Feet   x1, 70 x 1, plus additional clinic distances   Assistive device Rolling walker    Gait Pattern Step-through pattern;Decreased hip/knee flexion - right   R hand orthosis, R AFO   Ambulation Surface Level;Indoor    Stairs Yes    Stairs Assistance 4: Min assist;4: Min guard    Stairs Assistance Details (indicate cue type and reason) initial cues for proper sequencing, min A when descending, pt taking incr time. pt needing initial min A to clear R foot on step, but able to clearing for remainder of steps primarily by hip hiking    Stair Management Technique One rail Left;Step to pattern;Forwards    Number of Stairs 16   4 steps x 4 reps   Height of Stairs 6      Therapeutic Activites    Therapeutic Activities Other Therapeutic Activities    Other Therapeutic Activities pt's gf Dustin Schneider asking if pt would be safe to go up 2 high steps into her house (also narrow without space to put RW) and with no handrail present and with the help of her and 2 other family members, after performing stairs, discussed this would not be safe at this time due to pt needing a railing for safety and balance, esp when descending stairs. discussed working on stairs as a goal going forward with a railing and with caregiver help, both verbalized understanding. pt's gf Dustin Schneider to take pictures of potential stairs at home to try to work towards               Balance Exercises - 08/01/20 0001      Balance Exercises: Standing   Rockerboard Lateral;Limitations    Rockerboard Limitations in // bars: standing on rockerboard in M/L direction, with single UE support weight shifting R and L with cues for technique for weight shift, then performed static standing keeping board steady 3 x 30 seconds, then keeping board still 2 x 5 reps head turns - with intermittent touch to bars for balance, min guard/min A for safety             PT Education - 08/01/20 1354    Education Details see TA    Person(s)  Educated Patient   gf Dustin Schneider   Methods Explanation    Comprehension Verbalized understanding            PT Short Term Goals - 07/28/20 0855      PT SHORT TERM GOAL #1   Title Pt will be mod I with all transfers with use of right AFO once he receives it for improved mobility.    Baseline Mod-I with all transfers 07/01/20    Time 4    Period Weeks    Status Achieved    Target Date 07/03/20      PT SHORT TERM  GOAL #2   Title Pt will be able to ambulate > 345' with RW with right AFO and hand grip attachment min assist for improved mobility.    Baseline 345' with RW w/ hand grip and R AFO with min A    Time 4    Period Weeks    Status Achieved    Target Date 07/03/20      PT SHORT TERM GOAL #3   Title Pt will decrease 5 x sit to stand from 17.98 sec to <15 sec for improved balance and functional strength.    Baseline 06/03/20 17.98 sec from mat with hand; 16.4 s with hands from mat 07/01/20; 12.75 seconds with LUE support from mat, supervision, 13.6 seconds no UE support on 07/28/20    Time 4    Period Weeks    Status Achieved    Target Date 07/31/20      PT SHORT TERM GOAL #4   Title Pt will be instructed in proper AFO wear when new brace received.    Baseline met    Time 4    Period Weeks    Status Achieved    Target Date 07/31/20             PT Long Term Goals - 06/03/20 1057      PT LONG TERM GOAL #1   Title Pt and pt's family members will be independent with final HEP in order to build upon functional gains made in therapy. ALL LTGS DUE 09/01/20    Baseline Pt has been performing current HEP with family but PT continues to add to it 06/03/20    Time 12    Period Weeks    Status On-going    Target Date 09/01/20      PT LONG TERM GOAL #2   Title Pt will ambulate >500' on varied surfaces supervision with RW and R AFO for improved household mobility and short community distances.    Baseline 230' with RW with right hand grip attachment and R AFO min assist on  06/03/20    Time 12    Period Weeks    Status Revised    Target Date 09/01/20      PT LONG TERM GOAL #3   Title Pt will decrease TUG from 27 sec to <20 sec for improved balance and decreased fall risk.    Baseline 06/03/20 TUG=27 sec with RW    Time 12    Period Weeks    Status New    Target Date 09/01/20      PT LONG TERM GOAL #4   Title Pt will be able to ambulate up/down curb with RW and R AFO CGA for improved community access.    Time 12    Period Weeks    Status New    Target Date 09/01/20                 Plan - 08/01/20 1518    Clinical Impression Statement Continued to work on gait training with RW, R AFO, and R hand orthosis with incr gait distance throughout session. Pt demonstrating improved R foot clearance during session today with assist from therapist to help maintain R anterior pelvic rotation. Practiced stairs for the first time today with pt needing min guard when ascending with step to pattern and min A when descending with a step to pattern and single handrail with using LUE. Pt is making excellent progress, will continue to progress towards LTGs.  Personal Factors and Comorbidities Comorbidity 3+    Comorbidities dense L MCA CVA, paroxysmal A. fib on chronic Xarelto, nonischemic cardiomyopathy and hypertension.    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, works as an Electrical engineer Evolving/Moderate complexity    Rehab Potential Good    PT Frequency 2x / week    PT Duration 12 weeks    PT Treatment/Interventions ADLs/Self Care Home Management;Aquatic Therapy;DME Instruction;Gait training;Stair training;Functional mobility training;Therapeutic activities;Therapeutic exercise;Electrical Stimulation;Balance training;Neuromuscular  re-education;Wheelchair mobility training;Orthotic Fit/Training;Patient/family education;Passive range of motion;Energy conservation    PT Next Visit Plan walking into therapy gym from waiting room (with working towards pt coming in with his RW and not in his wheelchair), stair training, obstacle negotiation, gait outdoors and over unlevel surfaces, curb training with RW. RLE NMR with decr UE support, balance on compliant surfaces.    PT Home Exercise Plan on 06/28/20 - added standing at countetop and stepping LLE out and in    Consulted and Agree with Plan of Care Patient;Family member/caregiver    Family Member Consulted gf, Dustin Schneider           Patient will benefit from skilled therapeutic intervention in order to improve the following deficits and impairments:  Abnormal gait,Decreased activity tolerance,Decreased balance,Decreased cognition,Decreased mobility,Decreased coordination,Decreased range of motion,Decreased endurance,Decreased strength,Hypomobility,Difficulty walking,Impaired UE functional use,Impaired vision/preception,Postural dysfunction,Impaired sensation  Visit Diagnosis: Hemiplegia and hemiparesis following cerebral infarction affecting right dominant side ([place])  Other lack of coordination  Unsteadiness on feet  Difficulty in walking, not elsewhere classified  Other symptoms and signs involving the nervous system     Problem List Patient Active Problem List   Diagnosis Date Noted   Benign essential HTN 06/09/2020   Abnormality of gait 04/28/2020   Neurogenic bladder 03/17/2020   Sepsis due to pneumonia ([place]) 03/06/2020   History of stroke with residual deficit 03/06/2020   Transient hypotension 03/06/2020   Spastic hemiplegia affecting nondominant side ([place])    History of hypertension    Expressive aphasia    AKI (acute kidney injury) ([place])    Global aphasia    Hypoalbuminemia due to protein-calorie malnutrition ([place])    Dysphagia, post-stroke     Hyperlipidemia 02/02/2020   Dysphagia following cerebral infarction 02/02/2020   Aspiration pneumonia ([place]) 02/02/2020   Acute ischemic left MCA stroke ([place]) 02/02/2020   Acute respiratory failure ([place])    Acute ischemic stroke ([place]) s/p clot retrieval L MCA & ACA A3 01/27/2020   Aspiration into airway 01/27/2020   Chronic anticoagulation 01/27/2020   Paroxysmal atrial fibrillation ([place]) 06/15/2018   Essential hypertension 06/15/2018   NICM (nonischemic cardiomyopathy) ([place]) 06/15/2018   Visit for monitoring Tikosyn therapy 06/12/2018    Arliss Journey, PT, DPT  08/01/2020, 3:22 PM  [facility] [address] [place], [place], [address] Phone: [phone]   Fax:  [phone]  Name: Dustin Schneider MRN: [id number] Date of Birth: [birth year]

## 2020-08-01 NOTE — Patient Instructions (Addendum)
Continue working with therapies for hopeful ongoing improvement  Continue Eliquis (apixaban) daily  and Crestor  for secondary stroke prevention  Continue to follow with cardiology for atrial fibrillation and eliquis management  Continue to follow up with PCP regarding cholesterol and blood pressure management  Maintain strict control of hypertension with blood pressure goal below 130/90, diabetes with hemoglobin A1c goal below 6.5% and cholesterol with LDL cholesterol (bad cholesterol) goal below 70 mg/dL.      Followup in the future with me in 6 months or call earlier if needed      Thank you for coming to see [place] at [facility]. I hope we have been able to provide you high quality care today.  You may receive a patient satisfaction survey over the next few weeks. We would appreciate your feedback and comments so that we may continue to improve ourselves and the health of our patients.

## 2020-08-01 NOTE — Therapy (Signed)
[facility] [address] [place], [place], [address] Phone: [phone]   Fax:  [phone]  Occupational Therapy Treatment  Patient Details  Name: Dustin Schneider MRN: [id number] Date of Birth: 01/28/1956 No data recorded  Encounter Date: 08/01/2020   OT End of Session - 08/01/20 1239    Visit Number 37    Number of Visits 41    Date for OT Re-Evaluation 08/19/20    Authorization Type BC/BS - No auth required, covered 100%    Authorization Time Period WEEK 7/8    OT Start Time 1015    OT Stop Time 1100    OT Time Calculation (min) 45 min    Activity Tolerance Patient tolerated treatment well    Behavior During Therapy WFL for tasks assessed/performed           Past Medical History:  Diagnosis Date  . Hypertension   . NICM (nonischemic cardiomyopathy) (HCC) 06/15/2018   04/23/18: EF 45%, 09/15/18: NOrmal LVEF  . Paroxysmal atrial fibrillation (HCC) 06/15/2018  . Stroke (HCC)   . Visit for monitoring Tikosyn therapy 06/12/2018    Past Surgical History:  Procedure Laterality Date  . BUBBLE STUDY  03/11/2020   Procedure: BUBBLE STUDY;  Surgeon: Ganji, Jay, MD;  [facility]: [facility];  Service: Cardiovascular;;  . CARDIOVERSION N/A 05/13/2018   Procedure: CARDIOVERSION;  Surgeon: Ganji, Jay, MD;  [facility];  Service: Cardiovascular;  Laterality: N/A;  . CARDIOVERSION N/A 06/13/2018   Procedure: CARDIOVERSION;  Surgeon: Nishan, Peter C, MD;  [facility]: [facility];  Service: Cardiovascular;  Laterality: N/A;  . CARDIOVERSION N/A 06/23/2018   Procedure: CARDIOVERSION;  Surgeon: Ganji, Jay, MD;  [facility];  Service: Cardiovascular;  Laterality: N/A;  . CARDIOVERSION N/A 03/11/2020   Procedure: CARDIOVERSION;  Surgeon: Ganji, Jay, MD;  [facility];  Service: Cardiovascular;  Laterality: N/A;  . excision of actinic keratosis    . EYE SURGERY     eye lift  . IR CT HEAD LTD   01/27/2020  . IR PERCUTANEOUS ART THROMBECTOMY/INFUSION INTRACRANIAL INC DIAG ANGIO  01/27/2020      . IR PERCUTANEOUS ART THROMBECTOMY/INFUSION INTRACRANIAL INC DIAG ANGIO  01/27/2020  . RADIOLOGY WITH ANESTHESIA N/A 01/27/2020   Procedure: IR WITH ANESTHESIA;  Surgeon: Radiologist, Medication, MD;  [facility];  Service: Radiology;  Laterality: N/A;  . TEE WITHOUT CARDIOVERSION N/A 03/11/2020   Procedure: TRANSESOPHAGEAL ECHOCARDIOGRAM (TEE);  Surgeon: Ganji, Jay, MD;  [facility];  Service: Cardiovascular;  Laterality: N/A;  . TONSILLECTOMY    . WISDOM TOOTH EXTRACTION      There were no vitals filed for this visit.   Subjective Assessment - 08/01/20 1018    Subjective  Estim unit going well per pt/family report    Patient is accompanied by: Family member    Pertinent History Lt MCA CVA 01/27/20, readmitted to hospital with HAP and fever on 03/06/20 and released 03/11/20. PMH: paroxsymal A-fib, HTN    Limitations fall    Currently in Pain? No/denies            Practiced walker negotiation in kitchen getting things out of higher cabinets, refrigerator, and side stepping along countertop with close supervision/CGA. Pt also practiced using w/c in kitchen for getting things out of lower cabinets and lowest part of refrigerator for safety and to assist in transporting items in lap while seated.  Pt/caregiver issued handouts on A/E for one handed use including: one handed cutting board, one   handed jar and can opener, pot stabilizer, and shoe funnel.                       OT Short Term Goals - 07/28/20 0951      OT SHORT TERM GOAL #1   Title Independent with initial HEP    Time 4    Period Weeks    Status Achieved      OT SHORT TERM GOAL #2   Title Pt to perform low to mid level reaching of light objects RUE in prep for feeding self and bathing    Time 4    Period Weeks    Status Partially Met   low level, inconsistent mid level     OT SHORT TERM GOAL  #3   Title Pt to feed self 50% of the time with Rt dominant hand and A/E prn    Time 4    Period Weeks    Status Not Met   07/28/20: 25%     OT SHORT TERM GOAL #4   Title Pt to perform UE dressing of pullover shirt mod I level    Time 4    Period Weeks    Status Achieved   met per pt and brother     OT SHORT TERM GOAL #5   Title Pt to perform LE dressing and all bathing with no more than mod assist using A/E and strategies prn    Time 4    Period Weeks    Status Achieved   04/20/20:  set-up for bedside bath, mod A for LE dressing     OT SHORT TERM GOAL #6   Title Pt to perform toileting and toilet transfers with no more than min assist overall    Time 4    Period Weeks    Status Achieved   supervision     OT SHORT TERM GOAL #7   Title Improve functional use of RUE as evidenced by performing 8 blocks on Box & Blocks test    Baseline 0    Time 4    Period Weeks    Status Achieved   04/20/20:  6 blocks, 05/05/20: 12 blocks            OT Long Term Goals - 07/28/20 0952      OT LONG TERM GOAL #1   Title Pt to be independent with updated HEP    Time 12    Period Weeks    Status Achieved      OT LONG TERM GOAL #2   Title Pt to perform all BADLS at mod I level    Time 12    Period Weeks    Status Not Met   07/28/20: 50% bathing; Min A for dressing, donning R shoe     OT LONG TERM GOAL #3   Title Pt to feed self 50% or greater with Rt dominant hand and grooming 25% or more with Rt hand    Time 12    Period Weeks    Status Not Met   07/28/20: 25% self-feeding w/ R hand; grooming w/ L hand     OT LONG TERM GOAL #4   Title Pt to retrieve light weight objects from high shelf RUE consistently    Time 12    Period Weeks    Status Not Met   07/28/20: low to mid height only     OT LONG TERM GOAL #5   Title   Pt to demo 25 lbs grip strength or more Rt dominant hand for gripping, opening jars/containers    Time 12    Period Weeks    Status Not Met   12.5 lbs; 1.7 lbs  (07/28/20)     OT LONG TERM GOAL #6   Title Pt to perform simple meal prep and light cleaning tasks mod I level    Time 12    Period Weeks    Status Deferred   deferred d/t inability to stand/ambulate without assist and AFO     OT LONG TERM GOAL #7   Title Improve functional use RUE as evidenced by performing 25 blocks on Box & Blocks    Time 12    Period Weeks    Status Revised   12 blocks     OT LONG TERM GOAL #8   Title Pt to returned to modified art/sculpting tasks with task modifications and A/E prn    Time 12    Period Weeks    Status Deferred                 Plan - 08/01/20 1240    Clinical Impression Statement Pt continues to do well with estim unit for home. Pt has begun drawing with Lt hand    Occupational performance deficits (Please refer to evaluation for details): ADL's;IADL's;Work;Leisure;Social Participation    Body Structure / Function / Physical Skills ADL;ROM;Dexterity;Balance;IADL;Body mechanics;Improper spinal/pelvic alignment;Sensation;Mobility;Flexibility;Strength;Coordination;FMC;Tone;UE functional use;Decreased knowledge of use of DME;Proprioception    Rehab Potential Good    Comorbidities Affecting Occupational Performance: May have comorbidities impacting occupational performance    OT Frequency 2x / week    OT Duration 8 weeks   additional 8 weeks beginning week of 06/20/20   OT Treatment/Interventions Self-care/ADL training;Therapeutic exercise;Functional Mobility Training;Neuromuscular education;Manual Therapy;Splinting;Aquatic Therapy;Manual lymph drainage;Therapeutic activities;Coping strategies training;DME and/or AE instruction;Electrical Stimulation;Fluidtherapy;Passive range of motion;Patient/family education    Plan practice making sandwich, reassess 9 hole peg test    Consulted and Agree with Plan of Care Patient    Family Member Consulted brother           Patient will benefit from skilled therapeutic intervention in order to improve  the following deficits and impairments:   Body Structure / Function / Physical Skills: ADL,ROM,Dexterity,Balance,IADL,Body mechanics,Improper spinal/pelvic alignment,Sensation,Mobility,Flexibility,Strength,Coordination,FMC,Tone,UE functional use,Decreased knowledge of use of DME,Proprioception       Visit Diagnosis: Hemiplegia and hemiparesis following cerebral infarction affecting right dominant side (HCC)  Other lack of coordination    Problem List Patient Active Problem List   Diagnosis Date Noted  . Benign essential HTN 06/09/2020  . Abnormality of gait 04/28/2020  . Neurogenic bladder 03/17/2020  . Sepsis due to pneumonia (HCC) 03/06/2020  . History of stroke with residual deficit 03/06/2020  . Transient hypotension 03/06/2020  . Spastic hemiplegia affecting nondominant side (HCC)   . History of hypertension   . Expressive aphasia   . AKI (acute kidney injury) (HCC)   . Global aphasia   . Hypoalbuminemia due to protein-calorie malnutrition (HCC)   . Dysphagia, post-stroke   . Hyperlipidemia 02/02/2020  . Dysphagia following cerebral infarction 02/02/2020  . Aspiration pneumonia (HCC) 02/02/2020  . Acute ischemic left MCA stroke (HCC) 02/02/2020  . Acute respiratory failure (HCC)   . Acute ischemic stroke (HCC) s/p clot retrieval L MCA & ACA A3 01/27/2020  . Aspiration into airway 01/27/2020  . Chronic anticoagulation 01/27/2020  . Paroxysmal atrial fibrillation (HCC) 06/15/2018  . Essential hypertension 06/15/2018  . NICM (nonischemic cardiomyopathy) (HCC)   06/15/2018  . Visit for monitoring Tikosyn therapy 06/12/2018    Ballie, Kelly Johnson, OTR/L 08/01/2020, 12:42 PM  [facility] [address] [place], [place], [address] Phone: [phone]   Fax:  [phone]  Name: Montrez E Kneebone MRN: [id number] Date of Birth: 05/17/1956 

## 2020-08-01 NOTE — Progress Notes (Signed)
[facility] [address] [place] [address] [phone]       STROKE FOLLOW UP NOTE  [name] Date of Birth:  1956-02-26 Medical Record Number:  [id number]   Reason for Referral: stroke follow up    SUBJECTIVE:   CHIEF COMPLAINT:  Chief Complaint  Patient presents with  . Follow-up    Rm 9, with partner, pt states he is doing well      HPI:   Today, 08/01/2020, Dustin Schneider returns for 66-month stroke follow-up accompanied by his significant other.  Continues to work with neuro rehab PT/OT/SLP for residual aphasia, right spastic hemiparesis and gait impairment with ongoing improvement. Has been using estim for RUE with good benefit.  Received Botox injections by Dr. Posey Pronto on 06/21/2020 with benefit. He has been ambulating short distance with RW and AFO brace. Denies new or worsening stroke/TIA symptoms.  Remains on Eliquis and Crestor without side effects.  Blood pressure today 128/75.  No concerns at this time.    History provided for reference purposes only Initial visit 03/30/2020 JM: Dustin Schneider is being seen for hospital follow-up accompanied by his brother in law Residual deficits moderate Broca's type aphasia, moderate to severe verbal apraxia, and right hemiparesis Continues to work with outpatient PT/OT/SLP with ongoing improvement Ambulating with therapy with RW  Currently living with his brother-in-law where he receives 24-hour support by brother-in-law and family members Denies new or worsening stroke/TIA symptoms Continues on Eliquis without bleeding or bruising Continues on Crestor without myalgias Blood pressure 124/82 No concerns at this time  Stroke admission 01/27/2020 DustinDustin Schneider a 64 y.o.malewith history of paroxysmal A. fib on chronic Xarelto, nonischemic cardiomyopathy and hypertensionwho presented on 01/27/2020 with L forced gaze and R hemiplegia with nausea and vomiting.Stroke work up revealed L  MCA and ACA scattered infarcts s/p TICI3 revascularization of L MCA and A3, infarct embolic secondary to known AF on Xarelto. Switched Xarelto to [employer]. Hx of HTN stable with long term BP goal normotensive range. Hx of HLD with LDL 33 and recommended continuation of Crestor 10mg  daily. Other stroke risk factors include NICM w/ EF 45% PTA (currently EF 55-60%) on [place] and advanced age. No prior stroke history. Evaluated by therapy and discharged to CIR for ongoing therapy needs with residual dysphagia, apraxia with limited verbal output, and right hemiparesis.   Stroke: L MCA and ACA scattered infarcts s/p TICI3 revascularization of L MCA &A3, infarct embolic secondary to known AF on Xarelto  Code Stroke CT head Hyperdense L M2. ASPECTS 10.   CTA head & neckL MCA bifurcation occlusion w/ little distal flow. Minimal collateral flow.  Cerebral angio distal L M1 occlusion, distal L A3 occlusion. TICI 3 revascularization MCA and ACA   Post IR CT no hemorrhage  MRI L basal ganglia infarct w/ mild edema. L cingulate and L superior frontal lobe infarcts w/ mild cortical edema. Few punctate infarcts R cingulate. Petechial hemorrhage at caudate. Small R cavernous malformation.   MRA L MCA and ACA open.  2D Echo - EF 55 - 60%. No cardiac source of emboli identified.   LDL33  HgbA1c - 5.6  Xarelto daily prior to admission, now on aspirin 325 po. switch to eliquis at d/c  Therapy recommendations: CIR  Disposition: CIR    ROS:   14 system review of systems performed and negative with exception of those listed in HPI  PMH:  Past Medical History:  Diagnosis Date  . Hypertension   .  NICM (nonischemic cardiomyopathy) ([place]) 06/15/2018   04/23/18: EF 45%, 09/15/18: NOrmal LVEF  . Paroxysmal atrial fibrillation ([place]) 06/15/2018  . Stroke ([place])   . Visit for monitoring Tikosyn therapy 06/12/2018    PSH:  Past Surgical History:  Procedure Laterality Date  . BUBBLE STUDY  03/11/2020    Procedure: BUBBLE STUDY;  Surgeon: [name], MD;  Location: [place];  Service: Cardiovascular;;  . CARDIOVERSION N/A 05/13/2018   Procedure: CARDIOVERSION;  Surgeon: [name], MD;  Location: [facility] ENDOSCOPY;  Service: Cardiovascular;  Laterality: N/A;  . CARDIOVERSION N/A 06/13/2018   Procedure: CARDIOVERSION;  Surgeon: [name], MD;  Location: [facility] ENDOSCOPY;  Service: Cardiovascular;  Laterality: N/A;  . CARDIOVERSION N/A 06/23/2018   Procedure: CARDIOVERSION;  Surgeon: [name], MD;  Location: [facility] ENDOSCOPY;  Service: Cardiovascular;  Laterality: N/A;  . CARDIOVERSION N/A 03/11/2020   Procedure: CARDIOVERSION;  Surgeon: [name], MD;  Location: [place];  Service: Cardiovascular;  Laterality: N/A;  . excision of actinic keratosis    . EYE SURGERY     eye lift  . IR CT HEAD LTD  01/27/2020  . IR PERCUTANEOUS ART THROMBECTOMY/INFUSION INTRACRANIAL INC DIAG ANGIO  01/27/2020      . IR PERCUTANEOUS ART THROMBECTOMY/INFUSION INTRACRANIAL INC DIAG ANGIO  01/27/2020  . RADIOLOGY WITH ANESTHESIA N/A 01/27/2020   Procedure: IR WITH ANESTHESIA;  Surgeon: Radiologist, Medication, MD;  Location: [place];  Service: Radiology;  Laterality: N/A;  . TEE WITHOUT CARDIOVERSION N/A 03/11/2020   Procedure: TRANSESOPHAGEAL ECHOCARDIOGRAM (TEE);  Surgeon: [name], MD;  Location: [place];  Service: Cardiovascular;  Laterality: N/A;  . TONSILLECTOMY    . WISDOM TOOTH EXTRACTION      Social History:  Social History   Socioeconomic History  . Marital status: Divorced    Spouse name: Not on file  . Number of children: 1  . Years of education: Not on file  . Highest education level: Not on file  Occupational History  . Not on file  Tobacco Use  . Smoking status: Never Smoker  . Smokeless tobacco: Never Used  Vaping Use  . Vaping Use: Never used  Substance and Sexual Activity  . Alcohol use: Not Currently  . Drug use: No  . Sexual activity: Not on file    Comment: DIVORCE  Other  Topics Concern  . 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 Glaucoma Mother   . Atrial fibrillation Father     Medications:   Current Outpatient Medications on File Prior to Visit  Medication Sig Dispense Refill  . baclofen (LIORESAL) 20 MG tablet Take 1 tablet (20 mg total) by mouth 3 (three) times daily. 90 each 1  . diltiazem (CARDIZEM CD) 180 MG 24 hr capsule Take 1 capsule (180 mg total) by mouth daily. 30 capsule 3  . ELIQUIS 5 MG TABS tablet TAKE 1 TABLET BY MOUTH TWICE DAILY. 60 tablet 0  . rosuvastatin (CRESTOR) 10 MG tablet Take 1 tablet (10 mg total) by mouth daily after supper. 30 tablet 3  .  dofetilide (TIKOSYN) 500 MCG capsule Take 1 capsule (500 mcg total) by mouth 2 (two) times daily. 180 capsule 0  . metoprolol tartrate (LOPRESSOR) 50 MG tablet Take 1 tablet (50 mg total) by mouth 2 (two) times daily. 180 tablet 2   No current facility-administered medications on file prior to visit.    Allergies:  No Known Allergies    OBJECTIVE:  Physical Exam  Vitals:   08/01/20 0828  BP: 128/75  Pulse: (!) 54  Weight: 158 lb (71.7 kg)  Height: 5\' 8"  (1.727 m)   Body mass index is 24.02 kg/m. No exam data present  General: well developed, well nourished, pleasant middle-age Caucasian man, seated, in no evident distress Head: head normocephalic and atraumatic.   Neck: supple with no carotid or supraclavicular bruits Cardiovascular: regular rate and rhythm, no murmurs Musculoskeletal: no deformity Skin:  no rash/petichiae Vascular:  Normal pulses all extremities    Neurologic Exam Mental Status: Awake and fully alert. Moderate to severe aphasia able to speak in short, simple sentences.  No evidence of receptive aphasia.  Oriented to place and time.  Recent and remote memory intact during visit.  Mood and affect appropriate.  Cranial Nerves: Pupils equal, briskly reactive to light. Extraocular movements full without nystagmus. Visual fields full to confrontation. Hearing intact. Facial sensation intact.  Mild right lower facial weakness.  Tongue, and palate moves normally and symmetrically.  Motor: Full strength left upper and lower extremity RUE: 4-/5 deltoid, 4/5 bicep and tricep, weak grip strength and spasticity RLE: 4-/5 hip flexor, 4+/5 knee extension and flexion and ankle plantar flexion, 1/5 dorsiflexion foot drop Sensory.:  Intact light touch, vibratory and pinprick but subjectively "odd sensation" with light touch Coordination: Rapid alternating movements normal on left side. Finger-to-nose and heel-to-shin performed accurately on left side.  Gait and Station: Able to stand from seated position without difficulty.  Gait assessment deferred as rolling walker not present during visit Reflexes: 2+ RUE and RLE; 1+ LUE and LLE and symmetric. Toes downgoing.         ASSESSMENT: Dustin Schneider is a 64 y.o. year old male presented with L forced gaze and R hemiplegia on 01/27/2020 with stroke work up revealing L MCA and ACA scattered infarcts s/p TICI3 of L MCA and A3, infarct embolic secondary to known AF on Xarelto therefore switched to Eliquis. Vascular risk factors include HTN, HLD, Afib, NICM and age.      PLAN:  1. L MCA and ACA stroke :  a. Residual deficit: Right spastic hemiparesis, gait impairment, moderate to severe aphasia and right lower facial weakness.  Improvement since prior visit.  Continue participation in outpatient therapy for hopeful further improvement.  Continue to follow with Dr. Posey Pronto for Botox for spastic  hemiparesis b. Continue Eliquis (apixaban) daily  and Crestor  for secondary stroke prevention.  c. Close PCP follow up for aggressive stroke risk factor management  2. Atrial fibrillation:  a. On Eliquis per cardiology 3. HTN:  a. BP goal <130/90.  Stable on current therapy per PCP 4. HLD:  a. LDL goal <70. Recent LDL 33.  On Crestor per PCP    Follow up in 6 months or call earlier if needed  CC:  [facility] provider: Dr. Louann Sjogren, Marvis Repress, FNP     I spent 30 minutes of face-to-face and non-face-to-face time with patient and significant other.  This included previsit chart review, lab review, study review, order entry, electronic health record documentation, patient education regarding  stroke, residual deficits, importance of managing stroke risk factors and answered all other questions to patient satisfaction   [name], [facility]  [facility] [address] [place], [place] [address]  Phone [phone] Fax [phone] Note: This document was prepared with digital dictation and possible smart phrase technology. Any transcriptional errors that result from this process are unintentional.

## 2020-08-01 NOTE — Therapy (Signed)
[facility] [address] [place], [place], [address] Phone: [phone]   Fax:  [phone]  Speech Language Pathology Treatment  Patient Details  Name: Dustin Schneider MRN: [id number] Date of Birth: [birth year] Referring Provider (SLP): Dr. Delice Lesch   Encounter Date: 08/01/2020   End of Session - 08/01/20 1812    Visit Number 14    Number of Visits 25    Date for SLP Re-Evaluation 09/19/20    SLP Start Time 1107    SLP Stop Time  1147    SLP Time Calculation (min) 40 min    Activity Tolerance Patient tolerated treatment well           Past Medical History:  Diagnosis Date  . Hypertension   . NICM (nonischemic cardiomyopathy) ([place]) 06/15/2018   04/23/18: EF 45%, 09/15/18: NOrmal LVEF  . Paroxysmal atrial fibrillation ([place]) 06/15/2018  . Stroke ([place])   . Visit for monitoring Tikosyn therapy 06/12/2018    Past Surgical History:  Procedure Laterality Date  . BUBBLE STUDY  03/11/2020   Procedure: BUBBLE STUDY;  Surgeon: [name], MD;  Location: [place];  Service: Cardiovascular;;  . CARDIOVERSION N/A 05/13/2018   Procedure: CARDIOVERSION;  Surgeon: [name], MD;  Location: [facility] ENDOSCOPY;  Service: Cardiovascular;  Laterality: N/A;  . CARDIOVERSION N/A 06/13/2018   Procedure: CARDIOVERSION;  Surgeon: [name], MD;  Location: [facility] ENDOSCOPY;  Service: Cardiovascular;  Laterality: N/A;  . CARDIOVERSION N/A 06/23/2018   Procedure: CARDIOVERSION;  Surgeon: [name], MD;  Location: [facility] ENDOSCOPY;  Service: Cardiovascular;  Laterality: N/A;  . CARDIOVERSION N/A 03/11/2020   Procedure: CARDIOVERSION;  Surgeon: [name], MD;  Location: [place];  Service: Cardiovascular;  Laterality: N/A;  . excision of actinic keratosis    . EYE SURGERY     eye lift  . IR CT HEAD LTD  01/27/2020  . IR PERCUTANEOUS ART THROMBECTOMY/INFUSION INTRACRANIAL INC DIAG ANGIO  01/27/2020      . IR PERCUTANEOUS ART  THROMBECTOMY/INFUSION INTRACRANIAL INC DIAG ANGIO  01/27/2020  . RADIOLOGY WITH ANESTHESIA N/A 01/27/2020   Procedure: IR WITH ANESTHESIA;  Surgeon: Radiologist, Medication, MD;  Location: [place];  Service: Radiology;  Laterality: N/A;  . TEE WITHOUT CARDIOVERSION N/A 03/11/2020   Procedure: TRANSESOPHAGEAL ECHOCARDIOGRAM (TEE);  Surgeon: [name], MD;  Location: [place];  Service: Cardiovascular;  Laterality: N/A;  . TONSILLECTOMY    . WISDOM TOOTH EXTRACTION      There were no vitals filed for this visit.   Subjective Assessment - 08/01/20 1805    Subjective Pt fidgeted in the bag behind his wheelchair and said "Note - - book" after approx 10 seconds of [name] asking him what he wanted.    Patient is accompained by: Family member   S.O., [name]   Currently in Pain? No/denies                 ADULT SLP TREATMENT - 08/01/20 1806      General Information   Behavior/Cognition Alert;Cooperative;Pleasant mood      Treatment Provided   Treatment provided Cognitive-Linquistic      Cognitive-Linquistic Treatment   Treatment focused on Aphasia;Apraxia;Patient/family/caregiver education    Skilled Treatment Dustin Schneider enters today without device. 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 [name] added : "Somehow he got them to do all this - he has a way." SLP targeted pt using multimodal communciation using gesture (  pt stood up to imitate statue posture x2 -sponataneously), written words, photos on iPhone, and maps app on iPhone to augment speech. He required usual min A to use writing faded to occasional min A. Dustin Schneider was successful in aproximating written word so SLP understood 4/7words. SLP again encouraged this outside of therpay to pt and [name]. Reinforced that spelling doesn't have to be correct, just an approximation can b esuccessful in understanding Dustin Schneider's message. Syntax has improved since previous session with  this SLP, with extended time, Dustin Schneider used 2 complete sentences in therapy.      Assessment / Recommendations / Plan   Plan Continue with current plan of care      Progression Toward Goals   Progression toward goals Progressing toward goals              SLP Short Term Goals - 07/25/20 1215      SLP SHORT TERM GOAL #1   Title Pt will name family, friends and art items with occasional min A 8/10 with approximations allowed.    Time 1    Period Weeks    Status Partially Met      SLP SHORT TERM GOAL #2   Title Pt will name 7 items in personally relevant categories with occasional min A over 2 sessions    Time 1    Period Weeks    Status Partially Met      SLP SHORT TERM GOAL #3   Title Pt will perform 4 automatic speech tasks with rare min A.    Time 1    Period Weeks    Status Partially Met      SLP SHORT TERM GOAL #4   Title Pt will write personal information (name/address/phone), 5 family/friend/pet names and 7  professional words with occasional min A    Time 1    Period Weeks    Status Not Met      SLP SHORT TERM GOAL #5   Title Pt will use mulitmodal communication (gesture, draw, write 1st letter etc) to augment verbal expression to meet needs at home with rare min A from family.    Time 1    Period Weeks    Status Partially Met            SLP Long Term Goals - 08/01/20 1813      SLP LONG TERM GOAL #1   Title Pt will verbalize 3 words to explain or describe his artwork and hobbies with occasional min A over 3 sessions, allowing for intellgible approximations    Baseline 05-17-20 (Lingraphica cues), 05-24-20 (lingraphica), 05-31-20 (lingraphica); 07/25/20 (written word)    Status Achieved      SLP LONG TERM GOAL #2   Title Pt and family will utilize multimodal compensations for aphasia and verbal apraxia to participate in 3 turns each of conversation with occasional min A over 2 sessions    Baseline 07/25/20;    Time 1    Period Weeks    Status Achieved       SLP LONG TERM GOAL #3   Title Pt will write 3 word phrases with less than 2 errors with rare min A over 3 sessions    Status Not Met      SLP LONG TERM GOAL #4   Title Reading assessment if indicated    Status Deferred      SLP LONG TERM GOAL #5   Title Dustin Schneider will use multimodal communication spontaneously  (writing, gesture, device) to  augment verbal expression as needed 3/5 times with rare min A    Time 8    Period Weeks    Status On-going      SLP LONG TERM GOAL #6   Title Dustin Schneider will write (approximate) 4 items in personally relevant category that is understood by family/friends    Time 8    Period Weeks    Status On-going      SLP LONG TERM GOAL #7   Title Pt will employ multimodal communication to participate in 6 turns in conversation with extended time and occasional min A    Time 8    Period Weeks    Status On-going            Plan - 08/01/20 1813    Clinical Impression Statement Improving moderate Broca's aphasia and severe verbal apraxia persist, affecting communication of basic wants, needs and social interactions.Dustin Schneider is highly motivated and 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 His utterance length in improving and sentences with accurate syntax are emerging with extended time and significant halting. Ongoing training in multimodal communication - . Continue skilled ST to maximize communication for QOL, safety and reduce family burden. I recommend continued skilled ST, goals updated, recert completed    Speech Therapy Frequency 2x / week    Duration --   20 weeks or 40 visits   Treatment/Interventions Environmental controls;Cueing hierarchy;SLP instruction and feedback;Compensatory strategies;Functional tasks;Cognitive reorganization;Compensatory techniques;Internal/external aids;Multimodal communcation approach;Patient/family education;Language facilitation    Potential to Achieve Goals Good           Patient  will benefit from skilled therapeutic intervention in order to improve the following deficits and impairments:   Aphasia  Verbal apraxia    Problem List Patient Active Problem List   Diagnosis Date Noted  . Benign essential HTN 06/09/2020  . Abnormality of gait 04/28/2020  . Neurogenic bladder 03/17/2020  . Sepsis due to pneumonia ([place]) 03/06/2020  . History of stroke with residual deficit 03/06/2020  . Transient hypotension 03/06/2020  . Spastic hemiplegia affecting nondominant side ([place])   . History of hypertension   . Expressive aphasia   . AKI (acute kidney injury) ([place])   . Global aphasia   . Hypoalbuminemia due to protein-calorie malnutrition ([place])   . Dysphagia, post-stroke   . Hyperlipidemia 02/02/2020  . Dysphagia following cerebral infarction 02/02/2020  . Aspiration pneumonia ([place]) 02/02/2020  . Acute ischemic left MCA stroke ([place]) 02/02/2020  . Acute respiratory failure ([place])   . Acute ischemic stroke [facility]) s/p clot retrieval L MCA & ACA A3 01/27/2020  . Aspiration into airway 01/27/2020  . Chronic anticoagulation 01/27/2020  . Paroxysmal atrial fibrillation ([place]) 06/15/2018  . Essential hypertension 06/15/2018  . NICM (nonischemic cardiomyopathy) ([place]) 06/15/2018  . Visit for monitoring Tikosyn therapy 06/12/2018    [facility] ,[place], [place]  08/01/2020, 6:13 PM  [facility] [address] [place], [place], [address] Phone: [phone]   Fax:  [phone]   Name: TYSHEEM ACCARDO MRN: [id number] Date of Birth: August 01, 1956

## 2020-08-02 ENCOUNTER — Other Ambulatory Visit: Payer: Self-pay

## 2020-08-02 ENCOUNTER — Encounter: Payer: Self-pay | Admitting: Physical Medicine & Rehabilitation

## 2020-08-02 ENCOUNTER — Encounter
Payer: BC Managed Care – PPO | Attending: Physical Medicine & Rehabilitation | Admitting: Physical Medicine & Rehabilitation

## 2020-08-02 VITALS — BP 129/82 | HR 54 | Ht 68.0 in | Wt 158.0 lb

## 2020-08-02 DIAGNOSIS — R269 Unspecified abnormalities of gait and mobility: Secondary | ICD-10-CM

## 2020-08-02 DIAGNOSIS — G8110 Spastic hemiplegia affecting unspecified side: Secondary | ICD-10-CM | POA: Diagnosis present

## 2020-08-02 DIAGNOSIS — R4701 Aphasia: Secondary | ICD-10-CM | POA: Diagnosis not present

## 2020-08-02 DIAGNOSIS — I69391 Dysphagia following cerebral infarction: Secondary | ICD-10-CM | POA: Insufficient documentation

## 2020-08-02 NOTE — Progress Notes (Signed)
I agree with the above plan 

## 2020-08-02 NOTE — Progress Notes (Signed)
Subjective:    Patient ID: Dustin Schneider, male    DOB: 1956/06/09, 64 y.o.   MRN: [id number]  HPI Male with history of PAF/on Xarelto, and ICM presents for hospital follow up for left MCA infarct.    Last clinic visit on 06/21/20.  Sister supplements history. Patient had Botulinum toxin injection at that time.  Since that time, pt states he had some benefit in tone.  He started NMES. He continues to be in therapies. He is wearing orthosis.  He is learning to draw with his left hand.  No problems swallowing.  He is vocalizing more.  He tolerated d/cing bladder meds. He had improvement with increase in Baclofen.  He using walker more for ambulation.   Pain Inventory Average Pain 0 Pain Right Now 0 My pain is right arm & hand spasms.  In the last 24 hours, has pain interfered with the following? General activity 0 Relation with others 0 Enjoyment of life 0 What TIME of day is your pain at its worst? n/a Sleep (in general) Fair  Pain is worse with: no pain Pain improves with: no pain Relief from Meds: no pain  Mobility ability to climb steps?  no do you drive?  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  no  Physicians involved in your care Any changes since last visit?  no Primary care Marrian Salvage, FNP Neurologist .   Family History  Problem Relation Age of Onset   Glaucoma Mother    Atrial fibrillation Father    Social History   Socioeconomic History   Marital status: Divorced    Spouse name: Not on file   Number of children: 1   Years of education: Not on file   Highest education level: Not on file  Occupational History   Not on file  Tobacco Use   Smoking status: Never Smoker    Smokeless tobacco: Never Used  Vaping Use   Vaping Use: Never used  Substance and Sexual Activity   Alcohol use: Not Currently   Drug use: No   Sexual activity: Not on file    Comment: DIVORCE  Other Topics Concern   Not on file  Social History Narrative   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 [place] Determinants of Health   Financial Resource Strain: Not on file  Food Insecurity: Not on file  Transportation Needs: Not on file  Physical Activity: Not on file  Stress: Not on file  Social Connections: Not on file   Past Surgical History:  Procedure Laterality Date   BUBBLE STUDY  03/11/2020   Procedure: BUBBLE STUDY;  Surgeon: [name], MD;  Location: [place];  Service: Cardiovascular;;   CARDIOVERSION N/A 05/13/2018   Procedure: CARDIOVERSION;  Surgeon: [name], MD;  Location: [place];  Service: Cardiovascular;  Laterality: N/A;   CARDIOVERSION N/A 06/13/2018   Procedure: CARDIOVERSION;  Surgeon: [name], MD;  Location: [place];  Service: Cardiovascular;  Laterality: N/A;   CARDIOVERSION N/A 06/23/2018   Procedure: CARDIOVERSION;  Surgeon: [name], MD;  Location: [facility];  Service: Cardiovascular;  Laterality: N/A;   CARDIOVERSION N/A 03/11/2020   Procedure: CARDIOVERSION;  Surgeon: [name], MD;  Location:  [facility] ENDOSCOPY;  Service: Cardiovascular;  Laterality: N/A;   excision of actinic keratosis     EYE SURGERY     eye lift   IR CT HEAD LTD  01/27/2020   IR PERCUTANEOUS ART THROMBECTOMY/INFUSION INTRACRANIAL INC DIAG ANGIO  01/27/2020       IR PERCUTANEOUS ART THROMBECTOMY/INFUSION INTRACRANIAL INC DIAG ANGIO  01/27/2020   RADIOLOGY WITH ANESTHESIA N/A 01/27/2020   Procedure: IR WITH ANESTHESIA;  Surgeon: Radiologist, Medication, MD;  Location: [place];  Service: Radiology;  Laterality: N/A;   TEE WITHOUT CARDIOVERSION N/A 03/11/2020   Procedure: TRANSESOPHAGEAL ECHOCARDIOGRAM (TEE);  Surgeon: [name],  MD;  Location: [facility] ENDOSCOPY;  Service: Cardiovascular;  Laterality: N/A;   TONSILLECTOMY     WISDOM TOOTH EXTRACTION     Past Medical History:  Diagnosis Date   Hypertension    NICM (nonischemic cardiomyopathy) ([place]) 06/15/2018   04/23/18: EF 45%, 09/15/18: NOrmal LVEF   Paroxysmal atrial fibrillation ([place]) 06/15/2018   Stroke ([place])    Visit for monitoring Tikosyn therapy 06/12/2018   BP 129/82    Pulse (!) 54    Ht 5\' 8"  (1.727 m)    Wt 158 lb (71.7 kg)    SpO2 94%    BMI 24.02 kg/m   Opioid Risk Score:   Fall Risk Score:  `1  Depression screen PHQ 2/9  Depression screen [facility] 2/9 06/21/2020 06/09/2020 03/17/2020  Decreased Interest 0 0 0  Down, Depressed, Hopeless 0 0 0  PHQ - 2 Score 0 0 0  Altered sleeping - - 1  Tired, decreased energy - - 0  Change in appetite - - 0  Feeling bad or failure about yourself  - - 0  Trouble concentrating - - 1  Moving slowly or fidgety/restless - - 3  Suicidal thoughts - - 0  PHQ-9 Score - - 5  Difficult doing work/chores - - Not difficult at all  Some recent data might be hidden    Review of Systems  Constitutional: Negative.   HENT: Negative.   Eyes: Negative.   Respiratory: Negative.   Cardiovascular: Negative.   Gastrointestinal: Negative.   Endocrine: Negative.   Genitourinary: Negative.   Musculoskeletal: Positive for gait problem.  Skin: Negative.   Allergic/Immunologic: Negative.   Neurological: Positive for tremors, speech difficulty, weakness and numbness.  Hematological: Negative.   Psychiatric/Behavioral: Negative.   All other systems reviewed and are negative.     Objective:   Physical Exam  Constitutional: No distress . Vital signs reviewed. HENT: Normocephalic.  Atraumatic. Eyes: EOMI. No discharge. Cardiovascular: No JVD.   Respiratory: Normal effort.  No stridor.   GI: Non-distended.   Skin: Warm and dry.  Intact. Psych: Normal mood.  Normal behavior. Musc: No edema in extremities.  No tenderness in  extremities. Neuro: Alert Expressive aphasia, improving Right facial weakness, improving Motor: RUE: Shoulder abduction 4-/5, elbow flex/ext 3+/5, wrist extension 2-/5, hand grip 2+/5 RLE: Hip flexion, knee extension 4/5, distally 0/5 [name]:  Right wrist flexors 1/4, finger flexors 1/4  Right ankle inverters 1/4    Assessment & Plan:  Male with history of PAF/on Xarelto, and ICM presents for hospital follow up for left MCA infarct.    1. Right hemiparesis, now with spasticity, impaired mobility and ADLs secondary to left MCA ischemic stroke  Continue therapies   Continue follow up with Neurology  Encouraged [facility] nightly  Education for Botulinum toxin provided, plan to injection Xeomin:   Right FCR: 25 units    FCU:  25 units    Adductor Pollicis: 10 units    Flexor Hallucis Longus 35    Flexor Digitorum Longus 35  2.  Post stroke dysphagia:   Cont accommodations   Continue therapies  3.  Urinary retention/Neurogenic bladder:   Resolved  4.  Spasticity  Tizanidine caused lethargy  Continue Baclofen 20 TID  5. Gait abnormality  Continue therapies  Continue wheelchair for safety, using walker more

## 2020-08-03 ENCOUNTER — Encounter: Payer: Self-pay | Admitting: Family

## 2020-08-03 ENCOUNTER — Ambulatory Visit (INDEPENDENT_AMBULATORY_CARE_PROVIDER_SITE_OTHER): Payer: BC Managed Care – PPO | Admitting: Family

## 2020-08-03 VITALS — BP 110/78 | HR 57 | Temp 98.8°F

## 2020-08-03 DIAGNOSIS — I10 Essential (primary) hypertension: Secondary | ICD-10-CM

## 2020-08-03 DIAGNOSIS — G8110 Spastic hemiplegia affecting unspecified side: Secondary | ICD-10-CM

## 2020-08-03 DIAGNOSIS — Z23 Encounter for immunization: Secondary | ICD-10-CM

## 2020-08-03 DIAGNOSIS — I63512 Cerebral infarction due to unspecified occlusion or stenosis of left middle cerebral artery: Secondary | ICD-10-CM | POA: Diagnosis not present

## 2020-08-03 DIAGNOSIS — Z1211 Encounter for screening for malignant neoplasm of colon: Secondary | ICD-10-CM | POA: Diagnosis not present

## 2020-08-03 NOTE — Progress Notes (Signed)
Dustin Schneider is a 64 y.o. male with the following history as recorded in EpicCare:  Patient Active Problem List   Diagnosis Date Noted  . Benign essential HTN 06/09/2020  . Abnormality of gait 04/28/2020  . Neurogenic bladder 03/17/2020  . Sepsis due to pneumonia ([place]) 03/06/2020  . History of stroke with residual deficit 03/06/2020  . Transient hypotension 03/06/2020  . Spastic hemiplegia affecting nondominant side ([place])   . History of hypertension   . Expressive aphasia   . AKI (acute kidney injury) ([place])   . Global aphasia   . Hypoalbuminemia due to protein-calorie malnutrition ([place])   . Dysphagia, post-stroke   . Hyperlipidemia 02/02/2020  . Dysphagia following cerebral infarction 02/02/2020  . Aspiration pneumonia ([place]) 02/02/2020  . Acute ischemic left MCA stroke ([place]) 02/02/2020  . Acute respiratory failure ([place])   . Acute ischemic stroke [facility]) s/p clot retrieval L MCA & ACA A3 01/27/2020  . Aspiration into airway 01/27/2020  . Chronic anticoagulation 01/27/2020  . Paroxysmal atrial fibrillation ([name]) 06/15/2018  . Essential hypertension 06/15/2018  . NICM (nonischemic cardiomyopathy) ([place]) 06/15/2018  . Visit for monitoring Tikosyn therapy 06/12/2018    Current Outpatient Medications  Medication Sig Dispense Refill  . baclofen (LIORESAL) 20 MG tablet Take 1 tablet (20 mg total) by mouth 3 (three) times daily. 90 each 1  . diltiazem (CARDIZEM CD) 180 MG 24 hr capsule TAKE (1) CAPSULE DAILY. 30 capsule 6  . ELIQUIS 5 MG TABS tablet TAKE 1 TABLET BY MOUTH TWICE DAILY. 60 tablet 0  . rosuvastatin (CRESTOR) 10 MG tablet TAKE 1 TABLET BY MOUTH DAILY AFTER SUPPER 30 tablet 6  . dofetilide (TIKOSYN) 500 MCG capsule Take 1 capsule (500 mcg total) by mouth 2 (two) times daily. 180 capsule 0  . metoprolol tartrate (LOPRESSOR) 50 MG tablet Take 1 tablet (50 mg total) by mouth 2 (two) times daily. 180 tablet 2   No current facility-administered medications for this visit.     Allergies: Patient has no known allergies.  Past Medical History:  Diagnosis Date  . Hypertension   . NICM (nonischemic cardiomyopathy) ([place]) 06/15/2018   04/23/18: EF 45%, 09/15/18: NOrmal LVEF  . Paroxysmal atrial fibrillation ([place]) 06/15/2018  . Stroke ([place])   . Visit for monitoring Tikosyn therapy 06/12/2018    Past Surgical History:  Procedure Laterality Date  . BUBBLE STUDY  03/11/2020   Procedure: BUBBLE STUDY;  Surgeon: [name], MD;  Location: [place];  Service: Cardiovascular;;  . CARDIOVERSION N/A 05/13/2018   Procedure: CARDIOVERSION;  Surgeon: [name], MD;  Location: [facility] ENDOSCOPY;  Service: Cardiovascular;  Laterality: N/A;  . CARDIOVERSION N/A 06/13/2018   Procedure: CARDIOVERSION;  Surgeon: [name], MD;  Location: [facility] ENDOSCOPY;  Service: Cardiovascular;  Laterality: N/A;  . CARDIOVERSION N/A 06/23/2018   Procedure: CARDIOVERSION;  Surgeon: [name], MD;  Location: [facility] ENDOSCOPY;  Service: Cardiovascular;  Laterality: N/A;  . CARDIOVERSION N/A 03/11/2020   Procedure: CARDIOVERSION;  Surgeon: [name], MD;  Location: [place];  Service: Cardiovascular;  Laterality: N/A;  . excision of actinic keratosis    . EYE SURGERY     eye lift  . IR CT HEAD LTD  01/27/2020  . IR PERCUTANEOUS ART THROMBECTOMY/INFUSION INTRACRANIAL INC DIAG ANGIO  01/27/2020      . IR PERCUTANEOUS ART THROMBECTOMY/INFUSION INTRACRANIAL INC DIAG ANGIO  01/27/2020  . RADIOLOGY WITH ANESTHESIA N/A 01/27/2020   Procedure: IR WITH ANESTHESIA;  Surgeon: Radiologist, Medication, MD;  Location: [place];  Service: Radiology;  Laterality: N/A;  . TEE WITHOUT CARDIOVERSION N/A 03/11/2020   Procedure: TRANSESOPHAGEAL ECHOCARDIOGRAM (TEE);  Surgeon: [name], MD;  Location: [place];  Service: Cardiovascular;  Laterality: N/A;  . TONSILLECTOMY    . WISDOM TOOTH EXTRACTION      Family History  Problem Relation Age of Onset  . Glaucoma Mother   . Atrial fibrillation Father     Social History    Tobacco Use  . Smoking status: Never Smoker  . Smokeless tobacco: Never Used  Substance Use Topics  . Alcohol use: Not Currently    Subjective:  4 month follow up; accompanied by family member today; Patient is continuing to work very hard in therapy and is now able to walk some with assistance of brace/ walker; No acute concerns today; Has had flu shot and COVID booster;  Would be agreeable to pneumonia shot today; Continuing to see cardiology and rehab provider regularly;   Objective:  Vitals:   08/03/20 0951  BP: 110/78  Pulse: (!) 57  Temp: 98.8 F (37.1 C)  TempSrc: Oral  SpO2: 98%    General: Well developed, well nourished, in no acute distress  Head: Normocephalic and atraumatic  Lungs: Respirations unlabored;  Neurologic: Alert and oriented; uses walker;  Assessment:  1. Need for vaccination for pneumococcus   2. Colon cancer screening   3. Acute ischemic left MCA stroke ([place])   4. Essential hypertension   5. Spastic hemiplegia affecting nondominant side ([place])     Plan:  1. Pneumovax given; 2. Order updated for Cologuard; Continue regular follow-up with his specialists including cardiology and rehab providers;  Plan to follow-up here in 1 year, sooner prn.  This visit occurred during the SARS-CoV-2 public health emergency.  Safety protocols were in place, including screening questions prior to the visit, additional usage of staff PPE, and extensive cleaning of exam room while observing appropriate contact time as indicated for disinfecting solutions.     No follow-ups on file.  Orders Placed This Encounter  Procedures  . Pneumococcal polysaccharide vaccine 23-valent greater than or equal to 2yo subcutaneous/IM    Requested Prescriptions    No prescriptions requested or ordered in this encounter

## 2020-08-03 NOTE — Patient Instructions (Signed)
You received a Pneumovax today; you will need a second shot in 5 years to complete this series. We have ordered a Cologuard for you and will be in touch once we get the results.

## 2020-08-04 ENCOUNTER — Ambulatory Visit: Payer: BC Managed Care – PPO | Admitting: Occupational Therapy

## 2020-08-04 ENCOUNTER — Other Ambulatory Visit: Payer: Self-pay

## 2020-08-04 ENCOUNTER — Ambulatory Visit: Payer: BC Managed Care – PPO | Admitting: Physical Therapy

## 2020-08-04 ENCOUNTER — Encounter: Payer: Self-pay | Admitting: Family

## 2020-08-04 ENCOUNTER — Encounter: Payer: Self-pay | Admitting: Physical Therapy

## 2020-08-04 DIAGNOSIS — R29818 Other symptoms and signs involving the nervous system: Secondary | ICD-10-CM

## 2020-08-04 DIAGNOSIS — R2681 Unsteadiness on feet: Secondary | ICD-10-CM

## 2020-08-04 DIAGNOSIS — I69351 Hemiplegia and hemiparesis following cerebral infarction affecting right dominant side: Secondary | ICD-10-CM | POA: Diagnosis not present

## 2020-08-04 DIAGNOSIS — M6281 Muscle weakness (generalized): Secondary | ICD-10-CM

## 2020-08-04 DIAGNOSIS — R278 Other lack of coordination: Secondary | ICD-10-CM

## 2020-08-04 DIAGNOSIS — R2689 Other abnormalities of gait and mobility: Secondary | ICD-10-CM

## 2020-08-04 DIAGNOSIS — R262 Difficulty in walking, not elsewhere classified: Secondary | ICD-10-CM

## 2020-08-04 NOTE — Therapy (Signed)
[place] [facility] [address] [place], [place], [address] Phone: [phone]   Fax:  [phone]  Occupational Therapy Treatment  Patient Details  Name: Dustin Schneider MRN: [id number] Date of Birth: Jul 17, 1956 No data recorded  Encounter Date: 08/04/2020   OT End of Session - 08/04/20 1253    Visit Number 42    Date for OT Re-Evaluation 08/19/20    Authorization Type BC/BS - No auth required, covered 100%    Authorization Time Period WEEK 7/8    OT Start Time 1148    OT Stop Time 1230    OT Time Calculation (min) 42 min    Activity Tolerance Patient tolerated treatment well    Behavior During Therapy WFL for tasks assessed/performed           Past Medical History:  Diagnosis Date  . Hypertension   . NICM (nonischemic cardiomyopathy) ([place]) 06/15/2018   04/23/18: EF 45%, 09/15/18: NOrmal LVEF  . Paroxysmal atrial fibrillation ([place]) 06/15/2018  . Stroke ([place])   . Visit for monitoring Tikosyn therapy 06/12/2018    Past Surgical History:  Procedure Laterality Date  . BUBBLE STUDY  03/11/2020   Procedure: BUBBLE STUDY;  Surgeon: [name], MD;  Location: [facility];  Service: Cardiovascular;;  . CARDIOVERSION N/A 05/13/2018   Procedure: CARDIOVERSION;  Surgeon: [name], MD;  Location: [facility] ENDOSCOPY;  Service: Cardiovascular;  Laterality: N/A;  . CARDIOVERSION N/A 06/13/2018   Procedure: CARDIOVERSION;  Surgeon: [name], MD;  Location: [facility] ENDOSCOPY;  Service: Cardiovascular;  Laterality: N/A;  . CARDIOVERSION N/A 06/23/2018   Procedure: CARDIOVERSION;  Surgeon: [name], MD;  Location: [facility] ENDOSCOPY;  Service: Cardiovascular;  Laterality: N/A;  . CARDIOVERSION N/A 03/11/2020   Procedure: CARDIOVERSION;  Surgeon: [name], MD;  Location: [place];  Service: Cardiovascular;  Laterality: N/A;  . excision of actinic keratosis    . EYE SURGERY     eye lift  . IR CT HEAD LTD  01/27/2020  . IR PERCUTANEOUS  ART THROMBECTOMY/INFUSION INTRACRANIAL INC DIAG ANGIO  01/27/2020      . IR PERCUTANEOUS ART THROMBECTOMY/INFUSION INTRACRANIAL INC DIAG ANGIO  01/27/2020  . RADIOLOGY WITH ANESTHESIA N/A 01/27/2020   Procedure: IR WITH ANESTHESIA;  Surgeon: Radiologist, Medication, MD;  Location: [place];  Service: Radiology;  Laterality: N/A;  . TEE WITHOUT CARDIOVERSION N/A 03/11/2020   Procedure: TRANSESOPHAGEAL ECHOCARDIOGRAM (TEE);  Surgeon: [name], MD;  Location: [place];  Service: Cardiovascular;  Laterality: N/A;  . TONSILLECTOMY    . WISDOM TOOTH EXTRACTION      There were no vitals filed for this visit.   Subjective Assessment - 08/04/20 1255    Subjective  Denies pain    Pertinent History Lt MCA CVA 01/27/20, readmitted to hospital with HAP and fever on 03/06/20 and released 03/11/20. PMH: paroxsymal A-fib, HTN    Limitations fall    Currently in Pain? No/denies                  Treatment: Pt walked in from lobby, using RW with hand grip minguard and min-mod v.c to slow down  Pt practiced using walker try and retrieving items from cabinet to carry to counter, minguard and min v.c for safety. Hired caregiver present. Pt reports making a sandwich in seated at home. therapist recommends pt performs this task seated so he can use both hands. Discussed safe exercise with pt/ caregiver, pt appeared very discouraged and he reports he wants to use weights.  Therapist discussed that weights can increase spasticity and would not be recommended. Attempted 9 hole peg test, pt was unable to perform with RUE.  Low range functional reaching to place checkers in connect 4 frame, min v.c mod difficulty            OT Education - 08/04/20 1251    Education Details reveiwed HEP issued 07/14/20, discussed with pt and caregiver recommendation that pt avoids use of theraband at this time as pt's RUE appears tight with increased spasticity    Person(s) Educated Patient;Caregiver(s)    Methods  Explanation;Demonstration;Verbal cues;Handout    Comprehension Verbalized understanding;Returned demonstration;Verbal cues required            OT Short Term Goals - 07/28/20 0951      OT SHORT TERM GOAL #1   Title Independent with initial HEP    Time 4    Period Weeks    Status Achieved      OT SHORT TERM GOAL #2   Title Pt to perform low to mid level reaching of light objects RUE in prep for feeding self and bathing    Time 4    Period Weeks    Status Partially Met   low level, inconsistent mid level     OT SHORT TERM GOAL #3   Title Pt to feed self 50% of the time with Rt dominant hand and A/E prn    Time 4    Period Weeks    Status Not Met   07/28/20: 25%     OT SHORT TERM GOAL #4   Title Pt to perform UE dressing of pullover shirt mod I level    Time 4    Period Weeks    Status Achieved   met per pt and brother     OT SHORT TERM GOAL #5   Title Pt to perform LE dressing and all bathing with no more than mod assist using A/E and strategies prn    Time 4    Period Weeks    Status Achieved   04/20/20:  set-up for bedside bath, mod A for LE dressing     OT SHORT TERM GOAL #6   Title Pt to perform toileting and toilet transfers with no more than min assist overall    Time 4    Period Weeks    Status Achieved   supervision     OT SHORT TERM GOAL #7   Title Improve functional use of RUE as evidenced by performing 8 blocks on Box & Blocks test    Baseline 0    Time 4    Period Weeks    Status Achieved   04/20/20:  6 blocks, 05/05/20: 12 blocks            OT Long Term Goals - 08/04/20 1206      OT LONG TERM GOAL #1   Title Pt to be independent with updated HEP    Status Achieved      OT LONG TERM GOAL #2   Title Pt to perform all BADLS at mod I level                 Plan - 08/04/20 1255    Clinical Impression Statement Pt is progressing towards goals. Pt demonstrates significant UE spasticity today, caregiver reports pt has bben using theraband.  Therapist discouraged theraband use for UE's as it is likely to increase spasticity.    Occupational performance deficits (Please refer to evaluation  for details): ADL's;IADL's;Work;Leisure;Social Participation    Body Structure / Function / Physical Skills ADL;ROM;Dexterity;Balance;IADL;Body mechanics;Improper spinal/pelvic alignment;Sensation;Mobility;Flexibility;Strength;Coordination;FMC;Tone;UE functional use;Decreased knowledge of use of DME;Proprioception    Rehab Potential Good    Comorbidities Affecting Occupational Performance: May have comorbidities impacting occupational performance    OT Frequency 2x / week    OT Duration 8 weeks   additional 8 weeks beginning week of 06/20/20   OT Treatment/Interventions Self-care/ADL training;Therapeutic exercise;Functional Mobility Training;Neuromuscular education;Manual Therapy;Splinting;Aquatic Therapy;Manual lymph drainage;Therapeutic activities;Coping strategies training;DME and/or AE instruction;Electrical Stimulation;Fluidtherapy;Passive range of motion;Patient/family education    Plan check goals anticipate d/c next week, low range functional use of RUE, reinforce safe exercise    Consulted and Agree with Plan of Care Patient    Family Member Consulted brother           Patient will benefit from skilled therapeutic intervention in order to improve the following deficits and impairments:   Body Structure / Function / Physical Skills: ADL,ROM,Dexterity,Balance,IADL,Body mechanics,Improper spinal/pelvic alignment,Sensation,Mobility,Flexibility,Strength,Coordination,FMC,Tone,UE functional use,Decreased knowledge of use of DME,Proprioception       Visit Diagnosis: Hemiplegia and hemiparesis following cerebral infarction affecting right dominant side (HCC)  Other lack of coordination  Unsteadiness on feet  Other symptoms and signs involving the nervous system  Muscle weakness (generalized)    Problem List Patient Active Problem List    Diagnosis Date Noted  . Benign essential HTN 06/09/2020  . Abnormality of gait 04/28/2020  . Neurogenic bladder 03/17/2020  . Sepsis due to pneumonia ([place]) 03/06/2020  . History of stroke with residual deficit 03/06/2020  . Transient hypotension 03/06/2020  . Spastic hemiplegia affecting nondominant side ([place])   . History of hypertension   . Expressive aphasia   . AKI (acute kidney injury) ([place])   . Global aphasia   . Hypoalbuminemia due to protein-calorie malnutrition ([place])   . Dysphagia, post-stroke   . Hyperlipidemia 02/02/2020  . Dysphagia following cerebral infarction 02/02/2020  . Aspiration pneumonia ([place]) 02/02/2020  . Acute ischemic left MCA stroke ([place]) 02/02/2020  . Acute respiratory failure ([place])   . Acute ischemic stroke [facility]) s/p clot retrieval L MCA & ACA A3 01/27/2020  . Aspiration into airway 01/27/2020  . Chronic anticoagulation 01/27/2020  . Paroxysmal atrial fibrillation ([place]) 06/15/2018  . Essential hypertension 06/15/2018  . NICM (nonischemic cardiomyopathy) ([place]) 06/15/2018  . Visit for monitoring Tikosyn therapy 06/12/2018    Freddie Dymek 08/04/2020, 12:58 PM  [facility] [address] [place], [place], [address] Phone: [phone]   Fax:  [phone]  Name: Dustin Schneider MRN: [id number] Date of Birth: Dec 01, 1955

## 2020-08-04 NOTE — Therapy (Signed)
[facility] [address] [place], [place], [address] Phone: [phone]   Fax:  [phone]  Physical Therapy Treatment  Patient Details  Name: Dustin Schneider MRN: [id number] Date of Birth: [birth year] Referring Provider (PT): Lauraine Rinne, PA-C (followed by Dr. Delice Lesch)   Encounter Date: 08/04/2020   PT End of Session - 08/04/20 1235    Visit Number 42    Number of Visits 59    Date for PT Re-Evaluation 09/01/20    Authorization Type BCBS    PT Start Time 1233    PT Stop Time 1315    PT Time Calculation (min) 42 min    Equipment Utilized During Treatment Gait belt    Activity Tolerance Patient tolerated treatment well    Behavior During Therapy [facility] for tasks assessed/performed           Past Medical History:  Diagnosis Date  . Hypertension   . NICM (nonischemic cardiomyopathy) ([place]) 06/15/2018   04/23/18: EF 45%, 09/15/18: NOrmal LVEF  . Paroxysmal atrial fibrillation ([place]) 06/15/2018  . Stroke ([place])   . Visit for monitoring Tikosyn therapy 06/12/2018    Past Surgical History:  Procedure Laterality Date  . BUBBLE STUDY  03/11/2020   Procedure: BUBBLE STUDY;  Surgeon: [name], MD;  Location: [place];  Service: Cardiovascular;;  . CARDIOVERSION N/A 05/13/2018   Procedure: CARDIOVERSION;  Surgeon: [name], MD;  Location: [facility] ENDOSCOPY;  Service: Cardiovascular;  Laterality: N/A;  . CARDIOVERSION N/A 06/13/2018   Procedure: CARDIOVERSION;  Surgeon: [name], MD;  Location: [facility] ENDOSCOPY;  Service: Cardiovascular;  Laterality: N/A;  . CARDIOVERSION N/A 06/23/2018   Procedure: CARDIOVERSION;  Surgeon: [name], MD;  Location: [facility] ENDOSCOPY;  Service: Cardiovascular;  Laterality: N/A;  . CARDIOVERSION N/A 03/11/2020   Procedure: CARDIOVERSION;  Surgeon: [name], MD;  Location: [place];  Service: Cardiovascular;  Laterality: N/A;  . excision of actinic keratosis    . EYE SURGERY      eye lift  . IR CT HEAD LTD  01/27/2020  . IR PERCUTANEOUS ART THROMBECTOMY/INFUSION INTRACRANIAL INC DIAG ANGIO  01/27/2020      . IR PERCUTANEOUS ART THROMBECTOMY/INFUSION INTRACRANIAL INC DIAG ANGIO  01/27/2020  . RADIOLOGY WITH ANESTHESIA N/A 01/27/2020   Procedure: IR WITH ANESTHESIA;  Surgeon: Radiologist, Medication, MD;  Location: [place];  Service: Radiology;  Laterality: N/A;  . TEE WITHOUT CARDIOVERSION N/A 03/11/2020   Procedure: TRANSESOPHAGEAL ECHOCARDIOGRAM (TEE);  Surgeon: [name], MD;  Location: [place];  Service: Cardiovascular;  Laterality: N/A;  . TONSILLECTOMY    . WISDOM TOOTH EXTRACTION      There were no vitals filed for this visit.   Subjective Assessment - 08/04/20 1234    Subjective No new complaints. No falls or pain to report. Used walker to get into/out of therapy today with caregiver assistance.    Patient is accompained by: --   caregiver Jody   Pertinent History PMH: HTN, paroxysmal a fib    Limitations Standing;Walking    Patient Stated Goals "wants everything working on R side again"    Currently in Pain? No/denies    Pain Score 0-No pain              [facility] Adult PT Treatment/Exercise - 08/04/20 1236      Transfers   Transfers Sit to Stand;Stand to Sit    Sit to Stand 5: Supervision;With upper extremity assist;From chair/3-in-1;4: Min guard    Stand to Sit 5:  Supervision;With upper extremity assist      Ambulation/Gait   Ambulation/Gait Yes    Ambulation/Gait Assistance 4: Min guard;4: Min assist    Ambulation/Gait Assistance Details working on control at right pelvis to decrease retraction, promote forward translation for increased hip/knee flexion with swing phase, decreased circumdution.    Ambulation Distance (Feet) 575 Feet   x1, plus around gym with session   Assistive device Rolling walker   right hand orthosis, right AFO   Gait Pattern Step-through pattern;Decreased hip/knee flexion - right    Ambulation Surface Level;Indoor     Stairs Yes    Stairs Assistance 4: Min guard    Stairs Assistance Details (indicate cue type and reason) reminder cues for sequencing and weight shifting. no physical assist needed with stair negotiation with single rail support.    Stair Management Technique One rail Right;One rail Left;Step to pattern;Forwards    Number of Stairs 4   x 3 reps   Height of Stairs 6               Balance Exercises - 08/04/20 1304      Balance Exercises: Standing   Standing Eyes Closed Narrow base of support (BOS);Wide (BOA);Head turns;Foam/compliant surface;Other reps (comment);30 secs;Limitations    Standing Eyes Closed Limitations on airex no UE support: feet together for EC 30 sec's x 3 reps, progressing to feet apart for EC head movements left<>right, up<>down for ~10 reps. min assist needed with cues/facilitation for weight shifting and posture. cues to keep right knee straight/quad engaged.    SLS Eyes closed;Solid surface;Intermittent upper extremity support;30 secs;Other reps (comment);Limitations;Modified    SLS Limitations right stance on solid surface with left foot forward on 4 inch box: EC 30 sec's x 3 reps, then with EO head movements left<>right, up<>down for 6-8 reps each with min/mod assist/faciliation for posture, equal weight shifting and to keep right knee straight in stance.    Partial Tandem Stance Eyes closed;Foam/compliant surface;30 secs;3 reps;Limitations    Partial Tandem Stance Limitations on airex wth no UE support for 3 reps each foot foward with min/mod assist, cues on posture, equal weight bearing and to keep right knee straight with stance.               PT Short Term Goals - 07/28/20 0855      PT SHORT TERM GOAL #1   Title Pt will be mod I with all transfers with use of right AFO once he receives it for improved mobility.    Baseline Mod-I with all transfers 07/01/20    Time 4    Period Weeks    Status Achieved    Target Date 07/03/20      PT SHORT TERM GOAL  #2   Title Pt will be able to ambulate > 345' with RW with right AFO and hand grip attachment min assist for improved mobility.    Baseline 345' with RW w/ hand grip and R AFO with min A    Time 4    Period Weeks    Status Achieved    Target Date 07/03/20      PT SHORT TERM GOAL #3   Title Pt will decrease 5 x sit to stand from 17.98 sec to <15 sec for improved balance and functional strength.    Baseline 06/03/20 17.98 sec from mat with hand; 16.4 s with hands from mat 07/01/20; 12.75 seconds with LUE support from mat, supervision, 13.6 seconds no UE support on 07/28/20  Time 4    Period Weeks    Status Achieved    Target Date 07/31/20      PT SHORT TERM GOAL #4   Title Pt will be instructed in proper AFO wear when new brace received.    Baseline met    Time 4    Period Weeks    Status Achieved    Target Date 07/31/20             PT Long Term Goals - 06/03/20 1057      PT LONG TERM GOAL #1   Title Pt and pt's family members will be independent with final HEP in order to build upon functional gains made in therapy. ALL LTGS DUE 09/01/20    Baseline Pt has been performing current HEP with family but PT continues to add to it 06/03/20    Time 12    Period Weeks    Status On-going    Target Date 09/01/20      PT LONG TERM GOAL #2   Title Pt will ambulate >500' on varied surfaces supervision with RW and R AFO for improved household mobility and short community distances.    Baseline 230' with RW with right hand grip attachment and R AFO min assist on 06/03/20    Time 12    Period Weeks    Status Revised    Target Date 09/01/20      PT LONG TERM GOAL #3   Title Pt will decrease TUG from 27 sec to <20 sec for improved balance and decreased fall risk.    Baseline 06/03/20 TUG=27 sec with RW    Time 12    Period Weeks    Status New    Target Date 09/01/20      PT LONG TERM GOAL #4   Title Pt will be able to ambulate up/down curb with RW and R AFO CGA for improved  community access.    Time 12    Period Weeks    Status New    Target Date 09/01/20                 Plan - 08/04/20 1236    Clinical Impression Statement Today's skilled session continued to focus on gait with RW with emphsis on mechanics to decrease right leg kicking walker with swing phase, stair instruction and balance training on compliant surfaces. No issues noted or reported other than right LE fatigue, rest breaks taken for this as needed. The pt is progressing toward goals and should benefit from continued PT to progress toward unmet goals.    Personal Factors and Comorbidities Comorbidity 3+    Comorbidities dense L MCA CVA, paroxysmal A. fib on chronic Xarelto, nonischemic cardiomyopathy and hypertension.    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, works as an Electrical engineer Evolving/Moderate complexity    Rehab Potential Good    PT Frequency 2x / week    PT Duration 12 weeks    PT Treatment/Interventions ADLs/Self Care Home Management;Aquatic Therapy;DME Instruction;Gait training;Stair training;Functional mobility training;Therapeutic activities;Therapeutic exercise;Electrical Stimulation;Balance training;Neuromuscular re-education;Wheelchair mobility training;Orthotic Fit/Training;Patient/family education;Passive range of motion;Energy conservation    PT Next Visit Plan walking into therapy gym from waiting room (with working towards pt coming in with his RW and not in his wheelchair), stair training, obstacle negotiation, gait outdoors and over unlevel surfaces, curb training with RW. RLE NMR with decr  UE support, balance on compliant surfaces.    PT Home Exercise Plan on 06/28/20 - added standing at [place] and stepping LLE out and in    Consulted and Agree  with Plan of Care Patient;Family member/caregiver    Family Member Consulted gf, [name]           Patient will benefit from skilled therapeutic intervention in order to improve the following deficits and impairments:  Abnormal gait,Decreased activity tolerance,Decreased balance,Decreased cognition,Decreased mobility,Decreased coordination,Decreased range of motion,Decreased endurance,Decreased strength,Hypomobility,Difficulty walking,Impaired UE functional use,Impaired vision/preception,Postural dysfunction,Impaired sensation  Visit Diagnosis: Unsteadiness on feet  Difficulty in walking, not elsewhere classified  Other symptoms and signs involving the nervous system  Other abnormalities of gait and mobility  Muscle weakness (generalized)     Problem List Patient Active Problem List   Diagnosis Date Noted  . Benign essential HTN 06/09/2020  . Abnormality of gait 04/28/2020  . Neurogenic bladder 03/17/2020  . Sepsis due to pneumonia ([place]) 03/06/2020  . History of stroke with residual deficit 03/06/2020  . Transient hypotension 03/06/2020  . Spastic hemiplegia affecting nondominant side ([place])   . History of hypertension   . Expressive aphasia   . AKI (acute kidney injury) ([place])   . Global aphasia   . Hypoalbuminemia due to protein-calorie malnutrition ([place])   . Dysphagia, post-stroke   . Hyperlipidemia 02/02/2020  . Dysphagia following cerebral infarction 02/02/2020  . Aspiration pneumonia ([place]) 02/02/2020  . Acute ischemic left MCA stroke ([place]) 02/02/2020  . Acute respiratory failure ([place])   . Acute ischemic stroke [facility]) s/p clot retrieval L MCA & ACA A3 01/27/2020  . Aspiration into airway 01/27/2020  . Chronic anticoagulation 01/27/2020  . Paroxysmal atrial fibrillation ([place]) 06/15/2018  . Essential hypertension 06/15/2018  . NICM (nonischemic cardiomyopathy) ([place]) 06/15/2018  . Visit for monitoring Tikosyn therapy 06/12/2018    Willow Ora, PTA, [facility] [address], [place], [place] [address] [phone] 08/04/20, 2:00 PM   Name: Dustin Schneider MRN: [id number] Date of Birth: 08-11-1956

## 2020-08-08 ENCOUNTER — Ambulatory Visit: Payer: BC Managed Care – PPO | Admitting: Speech Pathology

## 2020-08-08 ENCOUNTER — Other Ambulatory Visit: Payer: Self-pay

## 2020-08-08 ENCOUNTER — Ambulatory Visit: Payer: BC Managed Care – PPO | Admitting: Occupational Therapy

## 2020-08-08 ENCOUNTER — Encounter: Payer: Self-pay | Admitting: Speech Pathology

## 2020-08-08 DIAGNOSIS — R4701 Aphasia: Secondary | ICD-10-CM

## 2020-08-08 DIAGNOSIS — R29818 Other symptoms and signs involving the nervous system: Secondary | ICD-10-CM

## 2020-08-08 DIAGNOSIS — I69351 Hemiplegia and hemiparesis following cerebral infarction affecting right dominant side: Secondary | ICD-10-CM | POA: Diagnosis not present

## 2020-08-08 DIAGNOSIS — R2689 Other abnormalities of gait and mobility: Secondary | ICD-10-CM

## 2020-08-08 DIAGNOSIS — R482 Apraxia: Secondary | ICD-10-CM

## 2020-08-08 DIAGNOSIS — R2681 Unsteadiness on feet: Secondary | ICD-10-CM

## 2020-08-08 DIAGNOSIS — R278 Other lack of coordination: Secondary | ICD-10-CM

## 2020-08-08 NOTE — Therapy (Signed)
[facility] [address] [place], [place], [address] Phone: [phone]   Fax:  [phone]  Speech Language Pathology Treatment  Patient Details  Name: Dustin Schneider MRN: [id number] Date of Birth: 09-27-1955 Referring Provider (SLP): Dr. Delice Lesch   Encounter Date: 08/08/2020   End of Session - 08/08/20 1208    Visit Number 15    Number of Visits 25    Date for SLP Re-Evaluation 09/19/20    SLP Start Time 0933    SLP Stop Time  6222    SLP Time Calculation (min) 41 min    Activity Tolerance Patient tolerated treatment well           Past Medical History:  Diagnosis Date  . Hypertension   . NICM (nonischemic cardiomyopathy) ([place]) 06/15/2018   04/23/18: EF 45%, 09/15/18: NOrmal LVEF  . Paroxysmal atrial fibrillation ([place]) 06/15/2018  . Stroke ([place])   . Visit for monitoring Tikosyn therapy 06/12/2018    Past Surgical History:  Procedure Laterality Date  . BUBBLE STUDY  03/11/2020   Procedure: BUBBLE STUDY;  Surgeon: [name], MD;  Location: [facility];  Service: Cardiovascular;;  . CARDIOVERSION N/A 05/13/2018   Procedure: CARDIOVERSION;  Surgeon: [name], MD;  Location: [facility] ENDOSCOPY;  Service: Cardiovascular;  Laterality: N/A;  . CARDIOVERSION N/A 06/13/2018   Procedure: CARDIOVERSION;  Surgeon: [name], MD;  Location: [facility] ENDOSCOPY;  Service: Cardiovascular;  Laterality: N/A;  . CARDIOVERSION N/A 06/23/2018   Procedure: CARDIOVERSION;  Surgeon: [name], MD;  Location: [facility] ENDOSCOPY;  Service: Cardiovascular;  Laterality: N/A;  . CARDIOVERSION N/A 03/11/2020   Procedure: CARDIOVERSION;  Surgeon: [name], MD;  Location: [place];  Service: Cardiovascular;  Laterality: N/A;  . excision of actinic keratosis    . EYE SURGERY     eye lift  . IR CT HEAD LTD  01/27/2020  . IR PERCUTANEOUS ART THROMBECTOMY/INFUSION INTRACRANIAL INC DIAG ANGIO  01/27/2020      . IR PERCUTANEOUS ART  THROMBECTOMY/INFUSION INTRACRANIAL INC DIAG ANGIO  01/27/2020  . RADIOLOGY WITH ANESTHESIA N/A 01/27/2020   Procedure: IR WITH ANESTHESIA;  Surgeon: Radiologist, Medication, MD;  Location: [facility];  Service: Radiology;  Laterality: N/A;  . TEE WITHOUT CARDIOVERSION N/A 03/11/2020   Procedure: TRANSESOPHAGEAL ECHOCARDIOGRAM (TEE);  Surgeon: [name], MD;  Location: [facility];  Service: Cardiovascular;  Laterality: N/A;  . TONSILLECTOMY    . WISDOM TOOTH EXTRACTION      There were no vitals filed for this visit.   Subjective Assessment - 08/08/20 1028    Subjective Jim asks [name] for his mininote book    Patient is accompained by: Family member   [name]   Currently in Pain? No/denies                 ADULT SLP TREATMENT - 08/08/20 1030      General Information   Behavior/Cognition Alert;Cooperative;Pleasant mood      Treatment Provided   Treatment provided Cognitive-Linquistic      Cognitive-Linquistic Treatment   Treatment focused on Aphasia;Apraxia;Patient/family/caregiver education    Skilled Treatment [name] showed me pictures of his sculptures-he used verbal approximations and [name] could ID words 85% of the time. I had difficulty due to the unique, low frequency words and lack of predicative words. Jim used [employer] in [employer] and used written approximations to augment verbal communication with frequent visual cues (poinitng to white board and marker) with 80% success. Encouraged him and [name] to  continue to use this at home. 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 Sentences were halting and required extended time consistently. [name] provides phonemic cues to [employer] with word finding and speech with rare min A. Explained the current situation of the Touch [employer] appeal.      Assessment / Recommendations / [place] with current plan of care      Progression Toward Goals    Progression toward goals Progressing toward goals            SLP Education - 08/08/20 1205    Education Details pre-practice, role play, multimodal communicatoin    Person(s) Educated Patient;Caregiver(s)    Methods Explanation;Demonstration;Verbal cues;Handout    Comprehension Returned demonstration;Verbal cues required;Need further instruction            SLP Short Term Goals - 08/08/20 1207      SLP SHORT TERM GOAL #1   Title Pt will name family, friends and art items with occasional min A 8/10 with approximations allowed.    Time 1    Period Weeks    Status Partially Met      SLP SHORT TERM GOAL #2   Title Pt will name 7 items in personally relevant categories with occasional min A over 2 sessions    Time 1    Period Weeks    Status Partially Met      SLP SHORT TERM GOAL #3   Title Pt will perform 4 automatic speech tasks with rare min A.    Time 1    Period Weeks    Status Partially Met      SLP SHORT TERM GOAL #4   Title Pt will write personal information (name/address/phone), 5 family/friend/pet names and 7  professional words with occasional min A    Time 1    Period Weeks    Status Not Met      SLP SHORT TERM GOAL #5   Title Pt will use mulitmodal communication (gesture, draw, write 1st letter etc) to augment verbal expression to meet needs at home with rare min A from family.    Time 1    Period Weeks    Status Partially Met            SLP Long Term Goals - 08/08/20 1207      SLP LONG TERM GOAL #1   Title Pt will verbalize 3 words to explain or describe his artwork and hobbies with occasional min A over 3 sessions, allowing for intellgible approximations    Baseline 05-17-20 (L[place]raph[place] cues), 05-24-20 (lingraphica), 05-31-20 (lingraphica); 07/25/20 (written word)    Status Achieved      SLP LONG TERM GOAL #2   Title Pt and family will utilize multimodal compensations for aphasia and verbal apraxia to participate in 3 turns each of conversation  with occasional min A over 2 sessions    Baseline 07/25/20;    Time 1    Period Weeks    Status Achieved      SLP LONG TERM GOAL #3   Title Pt will write 3 word phrases with less than 2 errors with rare min A over 3 sessions    Status Not Met      SLP LONG TERM GOAL #4   Title Reading assessment if indicated    Status Deferred      SLP LONG TERM GOAL #5  Title [name] will use multimodal communication spontaneously  (writing, gesture, device) to augment verbal expression as needed 3/5 times with rare min A    Time 7    Period Weeks    Status On-going      SLP LONG TERM GOAL #6   Title [name] will write (approximate) 4 items in personally relevant category that is understood by family/friends    Time 7    Period Weeks    Status On-going      SLP LONG TERM GOAL #7   Title Pt will employ multimodal communication to participate in 6 turns in conversation with extended time and occasional min A    Time 7    Period Weeks    Status On-going            Plan - 08/08/20 1206    Clinical Impression Statement Improving moderate Broca's aphasia and severe verbal apraxia persist, affecting communication of basic wants, needs and social interactions.[name] is highly motivated and 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 His utterance length in improving and sentences with accurate syntax are emerging with extended time and significant halting. Ongoing training in multimodal communication - . Continue skilled ST to maximize communication for QOL, safety and reduce family burden. I recommend continued skilled ST, goals updated, recert completed    Speech Therapy Frequency 2x / week    Duration --   20 weeks or 40 visits   Treatment/Interventions Environmental controls;Cueing hierarchy;SLP instruction and feedback;Compensatory strategies;Functional tasks;Cognitive reorganization;Compensatory techniques;Internal/external aids;Multimodal communcation  approach;Patient/family education;Language facilitation           Patient will benefit from skilled therapeutic intervention in order to improve the following deficits and impairments:   Aphasia  Verbal apraxia    Problem List Patient Active Problem List   Diagnosis Date Noted  . Benign essential HTN 06/09/2020  . Abnormality of gait 04/28/2020  . Neurogenic bladder 03/17/2020  . Sepsis due to pneumonia ([place]) 03/06/2020  . History of stroke with residual deficit 03/06/2020  . Transient hypotension 03/06/2020  . Spastic hemiplegia affecting nondominant side ([place])   . History of hypertension   . Expressive aphasia   . AKI (acute kidney injury) ([place])   . Global aphasia   . Hypoalbuminemia due to protein-calorie malnutrition ([place])   . Dysphagia, post-stroke   . Hyperlipidemia 02/02/2020  . Dysphagia following cerebral infarction 02/02/2020  . Aspiration pneumonia ([place]) 02/02/2020  . Acute ischemic left MCA stroke ([place]) 02/02/2020  . Acute respiratory failure ([place])   . Acute ischemic stroke [facility]) s/p clot retrieval L MCA & ACA A3 01/27/2020  . Aspiration into airway 01/27/2020  . Chronic anticoagulation 01/27/2020  . Paroxysmal atrial fibrillation ([place]) 06/15/2018  . Essential hypertension 06/15/2018  . NICM (nonischemic cardiomyopathy) ([place]) 06/15/2018  . Visit for monitoring Tikosyn therapy 06/12/2018    Shaquita Fort, Annye Rusk MS, CCC-SLP 08/08/2020, 12:08 PM  [facility] [address] [place], [place], [address] Phone: [phone]   Fax:  [phone]   Name: EBRAHIM DEREMER MRN: [id number] Date of Birth: April 10, 1956

## 2020-08-08 NOTE — Therapy (Signed)
[facility] [address] [place], [place], [address] Phone: [phone]   Fax:  [phone]  Occupational Therapy Treatment  Patient Details  Name: Dustin Schneider MRN: [id number] Date of Birth: 05-31-1956 No data recorded  Encounter Date: 08/08/2020   OT End of Session - 08/08/20 0936    Visit Number 39    Number of Visits 41    Date for OT Re-Evaluation 08/19/20    Authorization Type BC/BS - No auth required, covered 100%    Authorization Time Period WEEK 8/8    OT Start Time [phone]    OT Stop Time 1015    OT Time Calculation (min) 39 min    Activity Tolerance Patient tolerated treatment well    Behavior During Therapy [facility] for tasks assessed/performed           Past Medical History:  Diagnosis Date  . Hypertension   . NICM (nonischemic cardiomyopathy) ([place]) 06/15/2018   04/23/18: EF 45%, 09/15/18: NOrmal LVEF  . Paroxysmal atrial fibrillation ([place]) 06/15/2018  . Stroke ([place])   . Visit for monitoring Tikosyn therapy 06/12/2018    Past Surgical History:  Procedure Laterality Date  . BUBBLE STUDY  03/11/2020   Procedure: BUBBLE STUDY;  Surgeon: [name], MD;  Location: [facility];  Service: Cardiovascular;;  . CARDIOVERSION N/A 05/13/2018   Procedure: CARDIOVERSION;  Surgeon: [name], MD;  Location: [facility] ENDOSCOPY;  Service: Cardiovascular;  Laterality: N/A;  . CARDIOVERSION N/A 06/13/2018   Procedure: CARDIOVERSION;  Surgeon: [name], MD;  Location: [facility] ENDOSCOPY;  Service: Cardiovascular;  Laterality: N/A;  . CARDIOVERSION N/A 06/23/2018   Procedure: CARDIOVERSION;  Surgeon: [name], MD;  Location: [facility] ENDOSCOPY;  Service: Cardiovascular;  Laterality: N/A;  . CARDIOVERSION N/A 03/11/2020   Procedure: CARDIOVERSION;  Surgeon: [name], MD;  Location: [facility];  Service: Cardiovascular;  Laterality: N/A;  . excision of actinic keratosis    . EYE SURGERY     eye lift  . IR CT HEAD LTD   01/27/2020  . IR PERCUTANEOUS ART THROMBECTOMY/INFUSION INTRACRANIAL INC DIAG ANGIO  01/27/2020      . IR PERCUTANEOUS ART THROMBECTOMY/INFUSION INTRACRANIAL INC DIAG ANGIO  01/27/2020  . RADIOLOGY WITH ANESTHESIA N/A 01/27/2020   Procedure: IR WITH ANESTHESIA;  Surgeon: Radiologist, Medication, MD;  Location: [place];  Service: Radiology;  Laterality: N/A;  . TEE WITHOUT CARDIOVERSION N/A 03/11/2020   Procedure: TRANSESOPHAGEAL ECHOCARDIOGRAM (TEE);  Surgeon: [name], MD;  Location: [place];  Service: Cardiovascular;  Laterality: N/A;  . TONSILLECTOMY    . WISDOM TOOTH EXTRACTION      There were no vitals filed for this visit.   Subjective Assessment - 08/08/20 0936    Subjective  Denies pain.  Pt reports that he does not "recommend" Botox because his hand is weaker.  Pt/caregiver feels that the hand is opening better since he is using NMES 3x/day at home.    Patient is accompanied by: Family member    Pertinent History Lt MCA CVA 01/27/20, readmitted to hospital with HAP and fever on 03/06/20 and released 03/11/20. PMH: paroxsymal A-fib, HTN    Limitations fall    Currently in Pain? No/denies           Supine, self PROM shoulder flex followed by closed-chain shoulder flex and chest press with PVC frame with min facilitation/cues.  Sitting, closed-chain movement to reach to the floor and then scapular retraction with min cueing.  Low range functional reaching to place/remove  clothespins with 1lb resistance with mod-max difficulty.  Low range functional reaching to place large pegs in pegboard with with mod difficulty with coordination and occasional min-mod assist.   Functional mid-range reaching for cylinder objects in forward flex and abduction with min difficulty with control.        OT Education - 08/08/20 1028    Education Details Discussed ways to stengthen R hand functionally (squeezing out washcloth, but open hand fully after), picking up objects, trying to open  velcro, holding gait belt in left hand while trying to fasten (and practiced).  Cautioned pt to avoid squeezing too much, particulaly without opening hand fully as this may incr spasticity and recommended pt focus on functional activities.    Person(s) Educated Patient;Caregiver(s)    Methods Explanation;Demonstration;Verbal cues    Comprehension Verbalized understanding;Returned demonstration            OT Short Term Goals - 07/28/20 0951      OT SHORT TERM GOAL #1   Title Independent with initial HEP    Time 4    Period Weeks    Status Achieved      OT SHORT TERM GOAL #2   Title Pt to perform low to mid level reaching of light objects RUE in prep for feeding self and bathing    Time 4    Period Weeks    Status Partially Met   low level, inconsistent mid level     OT SHORT TERM GOAL #3   Title Pt to feed self 50% of the time with Rt dominant hand and A/E prn    Time 4    Period Weeks    Status Not Met   07/28/20: 25%     OT SHORT TERM GOAL #4   Title Pt to perform UE dressing of pullover shirt mod I level    Time 4    Period Weeks    Status Achieved   met per pt and brother     OT SHORT TERM GOAL #5   Title Pt to perform LE dressing and all bathing with no more than mod assist using A/E and strategies prn    Time 4    Period Weeks    Status Achieved   04/20/20:  set-up for bedside bath, mod A for LE dressing     OT SHORT TERM GOAL #6   Title Pt to perform toileting and toilet transfers with no more than min assist overall    Time 4    Period Weeks    Status Achieved   supervision     OT SHORT TERM GOAL #7   Title Improve functional use of RUE as evidenced by performing 8 blocks on Box & Blocks test    Baseline 0    Time 4    Period Weeks    Status Achieved   04/20/20:  6 blocks, 05/05/20: 12 blocks            OT Long Term Goals - 08/08/20 0937      OT LONG TERM GOAL #1   Title Pt to be independent with updated HEP    Time 12    Period Weeks    Status  Achieved      OT LONG TERM GOAL #2   Title Pt to perform all BADLS at mod I level    Time 12    Period Weeks    Status Not Met   07/28/20: 50% bathing; Min A for dressing,  donning R shoe     OT LONG TERM GOAL #3   Title Pt to feed self 50% or greater with Rt dominant hand and grooming 25% or more with Rt hand    Time 12    Period Weeks    Status Not Met   07/28/20: 25% self-feeding w/ R hand; grooming w/ L hand     OT LONG TERM GOAL #4   Title Pt to retrieve light weight objects from high shelf RUE consistently    Time 12    Period Weeks    Status Not Met   07/28/20: low to mid height only     OT LONG TERM GOAL #5   Title Pt to demo 25 lbs grip strength or more Rt dominant hand for gripping, opening jars/containers    Time 12    Period Weeks    Status Not Met   12.5 lbs; 1.7 lbs (07/28/20)     OT LONG TERM GOAL #6   Title Pt to perform simple meal prep and light cleaning tasks mod I level    Time 12    Period Weeks    Status Deferred   deferred d/t inability to stand/ambulate without assist and AFO     OT LONG TERM GOAL #7   Title Improve functional use RUE as evidenced by performing 25 blocks on Box & Blocks    Time 12    Period Weeks    Status Revised   12 blocks     OT LONG TERM GOAL #8   Title Pt to returned to modified art/sculpting tasks with task modifications and A/E prn    Time 12    Period Weeks    Status Deferred                 Plan - 08/08/20 0937    Clinical Impression Statement Pt is progressing towards goals. Pt demonstrates significant UE spasticity today, caregiver reports pt has bben using theraband. Therapist discouraged theraband use for UE's as it is likely to increase spasticity.    Occupational performance deficits (Please refer to evaluation for details): ADL's;IADL's;Work;Leisure;Social Participation    Body Structure / Function / Physical Skills ADL;ROM;Dexterity;Balance;IADL;Body mechanics;Improper spinal/pelvic  alignment;Sensation;Mobility;Flexibility;Strength;Coordination;FMC;Tone;UE functional use;Decreased knowledge of use of DME;Proprioception    Rehab Potential Good    Comorbidities Affecting Occupational Performance: May have comorbidities impacting occupational performance    OT Frequency 2x / week    OT Duration 8 weeks   additional 8 weeks beginning week of 06/20/20   OT Treatment/Interventions Self-care/ADL training;Therapeutic exercise;Functional Mobility Training;Neuromuscular education;Manual Therapy;Splinting;Aquatic Therapy;Manual lymph drainage;Therapeutic activities;Coping strategies training;DME and/or AE instruction;Electrical Stimulation;Fluidtherapy;Passive range of motion;Patient/family education    Plan check goals anticipate d/c next week, low range functional use of RUE, reinforce safe exercise    Consulted and Agree with Plan of Care Patient    Family Member Consulted brother           Patient will benefit from skilled therapeutic intervention in order to improve the following deficits and impairments:   Body Structure / Function / Physical Skills: ADL,ROM,Dexterity,Balance,IADL,Body mechanics,Improper spinal/pelvic alignment,Sensation,Mobility,Flexibility,Strength,Coordination,FMC,Tone,UE functional use,Decreased knowledge of use of DME,Proprioception       Visit Diagnosis: Hemiplegia and hemiparesis following cerebral infarction affecting right dominant side (HCC)  Other lack of coordination  Unsteadiness on feet  Other symptoms and signs involving the nervous system  Other abnormalities of gait and mobility    Problem List Patient Active Problem List   Diagnosis Date Noted  .  Benign essential HTN 06/09/2020  . Abnormality of gait 04/28/2020  . Neurogenic bladder 03/17/2020  . Sepsis due to pneumonia ([place]) 03/06/2020  . History of stroke with residual deficit 03/06/2020  . Transient hypotension 03/06/2020  . Spastic hemiplegia affecting nondominant side  ([place])   . History of hypertension   . Expressive aphasia   . AKI (acute kidney injury) ([place])   . Global aphasia   . Hypoalbuminemia due to protein-calorie malnutrition ([place])   . Dysphagia, post-stroke   . Hyperlipidemia 02/02/2020  . Dysphagia following cerebral infarction 02/02/2020  . Aspiration pneumonia ([place]) 02/02/2020  . Acute ischemic left MCA stroke ([place]) 02/02/2020  . Acute respiratory failure ([place])   . Acute ischemic stroke [facility]) s/p clot retrieval L MCA & ACA A3 01/27/2020  . Aspiration into airway 01/27/2020  . Chronic anticoagulation 01/27/2020  . Paroxysmal atrial fibrillation ([place]) 06/15/2018  . Essential hypertension 06/15/2018  . NICM (nonischemic cardiomyopathy) ([place]) 06/15/2018  . Visit for monitoring Tikosyn therapy 06/12/2018    [facility] 08/08/2020, 11:18 AM  [facility] [address] [place], [place], [address] Phone: [phone]   Fax:  [phone]  Name: Dustin Schneider MRN: [id number] Date of Birth: [birth year]   Vianne Bulls, OTR/L [facility] [address] [place], [place]  [address] [phone] phone [phone] 08/08/20 11:18 AM

## 2020-08-08 NOTE — Patient Instructions (Addendum)
   Aphasia Recovery Connection website  National Aphasia Association  [facility] [facility]  Face time is most helpful for phone calls  The sculpture is called "The Plague Years" based on the novel by Camus.  She is wearing a mask  The oysters are from the [facility] in [place]  Keep practicing some role playing before you interact or go out

## 2020-08-11 ENCOUNTER — Ambulatory Visit: Payer: BC Managed Care – PPO | Admitting: Occupational Therapy

## 2020-08-11 ENCOUNTER — Other Ambulatory Visit: Payer: Self-pay

## 2020-08-11 ENCOUNTER — Ambulatory Visit: Payer: BC Managed Care – PPO

## 2020-08-11 DIAGNOSIS — I69351 Hemiplegia and hemiparesis following cerebral infarction affecting right dominant side: Secondary | ICD-10-CM

## 2020-08-11 DIAGNOSIS — R4701 Aphasia: Secondary | ICD-10-CM

## 2020-08-11 DIAGNOSIS — R482 Apraxia: Secondary | ICD-10-CM

## 2020-08-11 DIAGNOSIS — M6281 Muscle weakness (generalized): Secondary | ICD-10-CM

## 2020-08-11 DIAGNOSIS — R29818 Other symptoms and signs involving the nervous system: Secondary | ICD-10-CM

## 2020-08-11 DIAGNOSIS — R278 Other lack of coordination: Secondary | ICD-10-CM

## 2020-08-11 DIAGNOSIS — R2689 Other abnormalities of gait and mobility: Secondary | ICD-10-CM

## 2020-08-11 DIAGNOSIS — R2681 Unsteadiness on feet: Secondary | ICD-10-CM

## 2020-08-11 NOTE — Therapy (Signed)
[facility] [address] [place], [place], [address] Phone: [phone]   Fax:  [phone]  Occupational Therapy Treatment  Patient Details  Name: Dustin Schneider MRN: [id number] Date of Birth: 08-25-1955 No data recorded  Encounter Date: 08/11/2020   OT End of Session - 08/11/20 1032    Visit Number 40    Number of Visits 41    Date for OT Re-Evaluation 08/19/20    Authorization Type BC/BS - No auth required, covered 100%    Authorization Time Period WEEK 8/8    OT Start Time 0930    OT Stop Time 1015    OT Time Calculation (min) 45 min    Activity Tolerance Patient tolerated treatment well    Behavior During Therapy [facility] for tasks assessed/performed           Past Medical History:  Diagnosis Date  . Hypertension   . NICM (nonischemic cardiomyopathy) ([place]) 06/15/2018   04/23/18: EF 45%, 09/15/18: NOrmal LVEF  . Paroxysmal atrial fibrillation ([place]) 06/15/2018  . Stroke ([place])   . Visit for monitoring Tikosyn therapy 06/12/2018    Past Surgical History:  Procedure Laterality Date  . BUBBLE STUDY  03/11/2020   Procedure: BUBBLE STUDY;  Surgeon: [name], MD;  Location: [place];  Service: Cardiovascular;;  . CARDIOVERSION N/A 05/13/2018   Procedure: CARDIOVERSION;  Surgeon: [name], MD;  Location: [facility] ENDOSCOPY;  Service: Cardiovascular;  Laterality: N/A;  . CARDIOVERSION N/A 06/13/2018   Procedure: CARDIOVERSION;  Surgeon: [name], MD;  Location: [facility] ENDOSCOPY;  Service: Cardiovascular;  Laterality: N/A;  . CARDIOVERSION N/A 06/23/2018   Procedure: CARDIOVERSION;  Surgeon: [name], MD;  Location: [facility] ENDOSCOPY;  Service: Cardiovascular;  Laterality: N/A;  . CARDIOVERSION N/A 03/11/2020   Procedure: CARDIOVERSION;  Surgeon: [name], MD;  Location: [place];  Service: Cardiovascular;  Laterality: N/A;  . excision of actinic keratosis    . EYE SURGERY     eye lift  . IR CT HEAD LTD   01/27/2020  . IR PERCUTANEOUS ART THROMBECTOMY/INFUSION INTRACRANIAL INC DIAG ANGIO  01/27/2020      . IR PERCUTANEOUS ART THROMBECTOMY/INFUSION INTRACRANIAL INC DIAG ANGIO  01/27/2020  . RADIOLOGY WITH ANESTHESIA N/A 01/27/2020   Procedure: IR WITH ANESTHESIA;  Surgeon: Radiologist, Medication, MD;  Location: [place];  Service: Radiology;  Laterality: N/A;  . TEE WITHOUT CARDIOVERSION N/A 03/11/2020   Procedure: TRANSESOPHAGEAL ECHOCARDIOGRAM (TEE);  Surgeon: [name], MD;  Location: [place];  Service: Cardiovascular;  Laterality: N/A;  . TONSILLECTOMY    . WISDOM TOOTH EXTRACTION      There were no vitals filed for this visit.   Subjective Assessment - 08/11/20 0933    Subjective  Denies pain.  Pt reports that he does not "recommend" Botox because his hand is weaker.  Pt/caregiver feels that the hand is opening better since he is using NMES 3x/day at home.    Patient is accompanied by: Family member    Pertinent History Lt MCA CVA 01/27/20, readmitted to hospital with HAP and fever on 03/06/20 and released 03/11/20. PMH: paroxsymal A-fib, HTN    Limitations fall    Currently in Pain? No/denies            Discussed d/c and assessed remaining goal which declined: Box & Blocks Lt = 10 (compared to 12 when last assessed)  Reviewed proper stretching with new caregiver and frequency/duration recommended for exercises and functional safe tasks. Reiterated avoiding theraband RUE. Discussed  when appropriate to use RUE and when not appropriate including moving towards more compensation strategies at this time vs. Remediation. However still encouraged simple functional use of RUE and to continue to use as assist for bilateral tasks when able. Encouraged pt to continue putty for hand but not do more than recommended and not to take the place of functional activities.  UBE x 10 min with Rt hand wrapped                      OT Short Term Goals - 07/28/20 0951      OT SHORT TERM  GOAL #1   Title Independent with initial HEP    Time 4    Period Weeks    Status Achieved      OT SHORT TERM GOAL #2   Title Pt to perform low to mid level reaching of light objects RUE in prep for feeding self and bathing    Time 4    Period Weeks    Status Partially Met   low level, inconsistent mid level     OT SHORT TERM GOAL #3   Title Pt to feed self 50% of the time with Rt dominant hand and A/E prn    Time 4    Period Weeks    Status Not Met   07/28/20: 25%     OT SHORT TERM GOAL #4   Title Pt to perform UE dressing of pullover shirt mod I level    Time 4    Period Weeks    Status Achieved   met per pt and brother     OT SHORT TERM GOAL #5   Title Pt to perform LE dressing and all bathing with no more than mod assist using A/E and strategies prn    Time 4    Period Weeks    Status Achieved   04/20/20:  set-up for bedside bath, mod A for LE dressing     OT SHORT TERM GOAL #6   Title Pt to perform toileting and toilet transfers with no more than min assist overall    Time 4    Period Weeks    Status Achieved   supervision     OT SHORT TERM GOAL #7   Title Improve functional use of RUE as evidenced by performing 8 blocks on Box & Blocks test    Baseline 0    Time 4    Period Weeks    Status Achieved   04/20/20:  6 blocks, 05/05/20: 12 blocks            OT Long Term Goals - 08/11/20 1029      OT LONG TERM GOAL #1   Title Pt to be independent with updated HEP    Time 12    Period Weeks    Status Achieved      OT LONG TERM GOAL #2   Title Pt to perform all BADLS at mod I level    Time 12    Period Weeks    Status Not Met   07/28/20: 50% bathing; Min A for dressing, donning R shoe     OT LONG TERM GOAL #3   Title Pt to feed self 50% or greater with Rt dominant hand and grooming 25% or more with Rt hand    Time 12    Period Weeks    Status Not Met   07/28/20: 25% self-feeding w/ R hand; grooming w/ L  hand     OT LONG TERM GOAL #4   Title Pt to retrieve  light weight objects from high shelf RUE consistently    Time 12    Period Weeks    Status Not Met   07/28/20: low to mid height only     OT LONG TERM GOAL #5   Title Pt to demo 25 lbs grip strength or more Rt dominant hand for gripping, opening jars/containers    Time 12    Period Weeks    Status Not Met   12.5 lbs; 1.7 lbs (07/28/20)     OT LONG TERM GOAL #6   Title Pt to perform simple meal prep and light cleaning tasks mod I level    Time 12    Period Weeks    Status Deferred   deferred d/t inability to stand/ambulate without assist and AFO     OT LONG TERM GOAL #7   Title Improve functional use RUE as evidenced by performing 25 blocks on Box & Blocks    Time 12    Period Weeks    Status Not Met   12 blocks, 10 blocks     OT LONG TERM GOAL #8   Title Pt to returned to modified art/sculpting tasks with task modifications and A/E prn    Time 12    Period Weeks    Status Deferred                 Plan - 08/11/20 1030    Clinical Impression Statement Pt with slight decline in Box & Blocks test today. Pt w/ limited progress due to limited use of RUE and increased tremors    Occupational performance deficits (Please refer to evaluation for details): ADL's;IADL's;Work;Leisure;Social Participation    Body Structure / Function / Physical Skills ADL;ROM;Dexterity;Balance;IADL;Body mechanics;Improper spinal/pelvic alignment;Sensation;Mobility;Flexibility;Strength;Coordination;FMC;Tone;UE functional use;Decreased knowledge of use of DME;Proprioception    Rehab Potential Good    Comorbidities Affecting Occupational Performance: May have comorbidities impacting occupational performance    OT Frequency 2x / week    OT Duration 8 weeks   additional 8 weeks beginning week of 06/20/20   OT Treatment/Interventions Self-care/ADL training;Therapeutic exercise;Functional Mobility Training;Neuromuscular education;Manual Therapy;Splinting;Aquatic Therapy;Manual lymph drainage;Therapeutic  activities;Coping strategies training;DME and/or AE instruction;Electrical Stimulation;Fluidtherapy;Passive range of motion;Patient/family education    Plan D/C O.T.    Consulted and Agree with Plan of Care Patient    Family Member Consulted brother           Patient will benefit from skilled therapeutic intervention in order to improve the following deficits and impairments:   Body Structure / Function / Physical Skills: ADL,ROM,Dexterity,Balance,IADL,Body mechanics,Improper spinal/pelvic alignment,Sensation,Mobility,Flexibility,Strength,Coordination,FMC,Tone,UE functional use,Decreased knowledge of use of DME,Proprioception       Visit Diagnosis: Hemiplegia and hemiparesis following cerebral infarction affecting right dominant side ([place])  Other lack of coordination  Unsteadiness on feet  Other symptoms and signs involving the nervous system    Problem List Patient Active Problem List   Diagnosis Date Noted  . Benign essential HTN 06/09/2020  . Abnormality of gait 04/28/2020  . Neurogenic bladder 03/17/2020  . Sepsis due to pneumonia ([place]) 03/06/2020  . History of stroke with residual deficit 03/06/2020  . Transient hypotension 03/06/2020  . Spastic hemiplegia affecting nondominant side ([place])   . History of hypertension   . Expressive aphasia   . AKI (acute kidney injury) ([place])   . Global aphasia   . Hypoalbuminemia due to protein-calorie malnutrition ([place])   . Dysphagia, post-stroke   .  Hyperlipidemia 02/02/2020  . Dysphagia following cerebral infarction 02/02/2020  . Aspiration pneumonia ([place]) 02/02/2020  . Acute ischemic left MCA stroke ([place]) 02/02/2020  . Acute respiratory failure ([place])   . Acute ischemic stroke [facility]) s/p clot retrieval L MCA & ACA A3 01/27/2020  . Aspiration into airway 01/27/2020  . Chronic anticoagulation 01/27/2020  . Paroxysmal atrial fibrillation ([place]) 06/15/2018  . Essential hypertension 06/15/2018  . NICM (nonischemic cardiomyopathy)  ([place]) 06/15/2018  . Visit for monitoring Tikosyn therapy 06/12/2018     OCCUPATIONAL THERAPY DISCHARGE SUMMARY  Visits from Start of Care: 40  Current functional level related to goals / functional outcomes: See above   Remaining deficits: ROM Strength Coordination RUE functional use   Education / Equipment: HEP's, functional safe activities, estim unit, A/E recommendations and task modifications prn  Plan: Patient agrees to discharge.  Patient goals were partially met. Patient is being discharged due to lack of progress.  ?????        Carey Bullocks, OTR/L 08/11/2020, 10:33 AM  [facility] [address] [place], [place], [address] Phone: [phone]   Fax:  [phone]  Name: Dustin Schneider MRN: [id number] Date of Birth: 02-Jan-1956

## 2020-08-11 NOTE — Therapy (Signed)
[facility] [address] [place], [place], [address] Phone: [phone]   Fax:  [phone]  Physical Therapy Treatment  Patient Details  Name: Dustin Schneider MRN: [id number] Date of Birth: [birth year] Referring Provider (PT): Lauraine Rinne, PA-C (followed by Dr. Delice Lesch)   Encounter Date: 08/11/2020   PT End of Session - 08/11/20 0849    Visit Number 43    Number of Visits 48    Date for PT Re-Evaluation 09/01/20    Authorization Type BCBS    PT Start Time 2836    PT Stop Time 0927    PT Time Calculation (min) 41 min    Equipment Utilized During Treatment Gait belt    Activity Tolerance Patient tolerated treatment well    Behavior During Therapy [facility] for tasks assessed/performed           Past Medical History:  Diagnosis Date   Hypertension    NICM (nonischemic cardiomyopathy) ([place]) 06/15/2018   04/23/18: EF 45%, 09/15/18: NOrmal LVEF   Paroxysmal atrial fibrillation ([place]) 06/15/2018   Stroke ([place])    Visit for monitoring Tikosyn therapy 06/12/2018    Past Surgical History:  Procedure Laterality Date   BUBBLE STUDY  03/11/2020   Procedure: BUBBLE STUDY;  Surgeon: [name], MD;  Location: [place];  Service: Cardiovascular;;   CARDIOVERSION N/A 05/13/2018   Procedure: CARDIOVERSION;  Surgeon: [name], MD;  Location: [place];  Service: Cardiovascular;  Laterality: N/A;   CARDIOVERSION N/A 06/13/2018   Procedure: CARDIOVERSION;  Surgeon: [name], MD;  Location: [place];  Service: Cardiovascular;  Laterality: N/A;   CARDIOVERSION N/A 06/23/2018   Procedure: CARDIOVERSION;  Surgeon: [name], MD;  Location: [place];  Service: Cardiovascular;  Laterality: N/A;   CARDIOVERSION N/A 03/11/2020   Procedure: CARDIOVERSION;  Surgeon: [name], MD;  Location: [place];  Service: Cardiovascular;  Laterality: N/A;   excision of actinic keratosis     EYE SURGERY      eye lift   IR CT HEAD LTD  01/27/2020   IR PERCUTANEOUS ART THROMBECTOMY/INFUSION INTRACRANIAL INC DIAG ANGIO  01/27/2020       IR PERCUTANEOUS ART THROMBECTOMY/INFUSION INTRACRANIAL INC DIAG ANGIO  01/27/2020   RADIOLOGY WITH ANESTHESIA N/A 01/27/2020   Procedure: IR WITH ANESTHESIA;  Surgeon: Radiologist, Medication, MD;  Location: [place];  Service: Radiology;  Laterality: N/A;   TEE WITHOUT CARDIOVERSION N/A 03/11/2020   Procedure: TRANSESOPHAGEAL ECHOCARDIOGRAM (TEE);  Surgeon: [name], MD;  Location: [place];  Service: Cardiovascular;  Laterality: N/A;   TONSILLECTOMY     WISDOM TOOTH EXTRACTION      There were no vitals filed for this visit.   Subjective Assessment - 08/11/20 0849    Subjective Pt reports that he has been walking quite a bit at home and walking in/out of therapy.    Patient is accompained by: --   caregiver Jody   Pertinent History PMH: HTN, paroxysmal a fib    Limitations Standing;Walking    Patient Stated Goals "wants everything working on R side again"    Currently in Pain? No/denies    Pain Onset 1 to 4 weeks ago                             [facility] Adult PT Treatment/Exercise - 08/11/20 0850      Transfers   Transfers Sit to Stand;Stand to Sit    Sit to Stand  5: Supervision    Stand to Sit 5: Supervision      Ambulation/Gait   Ambulation/Gait Yes    Ambulation/Gait Assistance 4: Min guard;4: Min assist    Ambulation/Gait Assistance Details Pt was cued to stand tall close to walker with verbal and tactile cues. PT also provided tactile cues at pelvis to try to facilitate more right hip flexion. Pt does kick right foot on walker leg at times as ER some. Pt was also cued to slow down at times to focus on right foot clearance.    Ambulation Distance (Feet) 230 Feet    Assistive device Rolling walker   right AFO, right hand grip attachment   Gait Pattern Step-through pattern;Decreased hip/knee flexion - right;Decreased stance time  - right    Ambulation Surface Level;Indoor    Stairs Yes    Stairs Assistance 4: Min guard    Stairs Assistance Details (indicate cue type and reason) Pt was given reminders for proper sequencing including weight shifting over right leg.    Stair Management Technique One rail Left;Step to pattern    Number of Stairs 8    Height of Stairs 6    Curb 4: Min assist    Curb Details (indicate cue type and reason) with walker with verbal cues for sequence    Gait Comments PT changed out velcro on hand grip attachment as was not sticking well.      Neuro Re-ed    Neuro Re-ed Details  Gait with walker weaving in and out of 4 cones x 4 bouts. Verbal cues to focus on foot placement. Standing with LLE on 4" step to facilitate right weight shift x 30 sec without hands, looking left/right, reaching for targets then with marching left LE x 10 from step to further increase weight shift with only LUE support CGA.      Exercises   Exercises Other Exercises    Other Exercises  PT performed passive right hip stretch in to IR 30 sec x 3. Discussed with patient how to perform on own at home to be sure he is maintaining good motion due to increased tone in to ER.      Knee/Hip Exercises: Aerobic   Other Aerobic SciFit BLE only level 3.5 x 5 min for strengthening and endurance with verbal cues to keep right hip in neutral.                  PT Education - 08/11/20 1824    Education Details Discussed working on self right hip stretch in to IR to be sure to maintain motion. To hold 1 min x 4.    Person(s) Educated Patient    Methods Explanation;Demonstration    Comprehension Verbalized understanding            PT Short Term Goals - 07/28/20 0855      PT SHORT TERM GOAL #1   Title Pt will be mod I with all transfers with use of right AFO once he receives it for improved mobility.    Baseline Mod-I with all transfers 07/01/20    Time 4    Period Weeks    Status Achieved    Target Date 07/03/20       PT SHORT TERM GOAL #2   Title Pt will be able to ambulate > 345' with RW with right AFO and hand grip attachment min assist for improved mobility.    Baseline 345' with RW w/ hand grip and R AFO with  min A    Time 4    Period Weeks    Status Achieved    Target Date 07/03/20      PT SHORT TERM GOAL #3   Title Pt will decrease 5 x sit to stand from 17.98 sec to <15 sec for improved balance and functional strength.    Baseline 06/03/20 17.98 sec from mat with hand; 16.4 s with hands from mat 07/01/20; 12.75 seconds with LUE support from mat, supervision, 13.6 seconds no UE support on 07/28/20    Time 4    Period Weeks    Status Achieved    Target Date 07/31/20      PT SHORT TERM GOAL #4   Title Pt will be instructed in proper AFO wear when new brace received.    Baseline met    Time 4    Period Weeks    Status Achieved    Target Date 07/31/20             PT Long Term Goals - 06/03/20 1057      PT LONG TERM GOAL #1   Title Pt and pt's family members will be independent with final HEP in order to build upon functional gains made in therapy. ALL LTGS DUE 09/01/20    Baseline Pt has been performing current HEP with family but PT continues to add to it 06/03/20    Time 12    Period Weeks    Status On-going    Target Date 09/01/20      PT LONG TERM GOAL #2   Title Pt will ambulate >500' on varied surfaces supervision with RW and R AFO for improved household mobility and short community distances.    Baseline 230' with RW with right hand grip attachment and R AFO min assist on 06/03/20    Time 12    Period Weeks    Status Revised    Target Date 09/01/20      PT LONG TERM GOAL #3   Title Pt will decrease TUG from 27 sec to <20 sec for improved balance and decreased fall risk.    Baseline 06/03/20 TUG=27 sec with RW    Time 12    Period Weeks    Status New    Target Date 09/01/20      PT LONG TERM GOAL #4   Title Pt will be able to ambulate up/down curb with RW and R  AFO CGA for improved community access.    Time 12    Period Weeks    Status New    Target Date 09/01/20                 Plan - 08/11/20 1823    Clinical Impression Statement Pt continues to show improved ability to weight bear through right leg with activities with no buckling noted. Pt does better when he slows down to focus on foot clearance with gait.    Personal Factors and Comorbidities Comorbidity 3+    Comorbidities dense L MCA CVA, paroxysmal A. fib on chronic Xarelto, nonischemic cardiomyopathy and hypertension.    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, works as an Electrical engineer Evolving/Moderate complexity    Rehab Potential Good    PT Frequency 2x / week    PT Duration 12 weeks    PT Treatment/Interventions ADLs/Self Care Home Management;Aquatic Therapy;DME Instruction;Gait training;Stair training;Functional mobility training;Therapeutic  activities;Therapeutic exercise;Electrical Stimulation;Balance training;Neuromuscular re-education;Wheelchair mobility training;Orthotic Fit/Training;Patient/family education;Passive range of motion;Energy conservation    PT Next Visit Plan stair training, obstacle negotiation, gait outdoors and over unlevel surfaces, curb training with RW. RLE NMR with decr UE support, balance on compliant surfaces.    PT Home Exercise Plan on 06/28/20 - added standing at [place]etop and stepping LLE out and in    Consulted and Agree with Plan of Care Patient;Family member/caregiver    Family Member Consulted gf, [name]           Patient will benefit from skilled therapeutic intervention in order to improve the following deficits and impairments:  Abnormal gait,Decreased activity tolerance,Decreased balance,Decreased cognition,Decreased  mobility,Decreased coordination,Decreased range of motion,Decreased endurance,Decreased strength,Hypomobility,Difficulty walking,Impaired UE functional use,Impaired vision/preception,Postural dysfunction,Impaired sensation  Visit Diagnosis: Other abnormalities of gait and mobility  Muscle weakness (generalized)  Hemiplegia and hemiparesis following cerebral infarction affecting right dominant side [facility])     Problem List Patient Active Problem List   Diagnosis Date Noted   Benign essential HTN 06/09/2020   Abnormality of gait 04/28/2020   Neurogenic bladder 03/17/2020   Sepsis due to pneumonia ([place]) 03/06/2020   History of stroke with residual deficit 03/06/2020   Transient hypotension 03/06/2020   Spastic hemiplegia affecting nondominant side ([place])    History of hypertension    Expressive aphasia    AKI (acute kidney injury) ([place])    Global aphasia    Hypoalbuminemia due to protein-calorie malnutrition ([place])    Dysphagia, post-stroke    Hyperlipidemia 02/02/2020   Dysphagia following cerebral infarction 02/02/2020   Aspiration pneumonia ([place]) 02/02/2020   Acute ischemic left MCA stroke ([place]) 02/02/2020   Acute respiratory failure ([employer])    Acute ischemic stroke ([place]) s/p clot retrieval L MCA & ACA A3 01/27/2020   Aspiration into airway 01/27/2020   Chronic anticoagulation 01/27/2020   Paroxysmal atrial fibrillation ([place]) 06/15/2018   Essential hypertension 06/15/2018   NICM (nonischemic cardiomyopathy) ([place]) 06/15/2018   Visit for monitoring Tikosyn therapy 06/12/2018    Electa Sniff, PT, DPT, NCS 08/11/2020, 6:29 PM  [facility] [address] [place], [place], [address] Phone: [phone]   Fax:  [phone]  Name: Dustin Schneider MRN: [id number] Date of Birth: 1955/09/04

## 2020-08-11 NOTE — Therapy (Signed)
[facility] [address] [place], [place], [address] Phone: [phone]   Fax:  [phone]  Speech Language Pathology Treatment  Patient Details  Name: Dustin Schneider MRN: [id number] Date of Birth: [birth year] Referring Provider (SLP): Dr. Delice Lesch   Encounter Date: 08/11/2020   End of Session - 08/11/20 1453    Visit Number 16    Number of Visits 25    Date for SLP Re-Evaluation 09/19/20    SLP Start Time 9    SLP Stop Time  1100    SLP Time Calculation (min) 41 min    Activity Tolerance Patient tolerated treatment well           Past Medical History:  Diagnosis Date  . Hypertension   . NICM (nonischemic cardiomyopathy) ([place]) 06/15/2018   04/23/18: EF 45%, 09/15/18: NOrmal LVEF  . Paroxysmal atrial fibrillation ([place]) 06/15/2018  . Stroke ([place])   . Visit for monitoring Tikosyn therapy 06/12/2018    Past Surgical History:  Procedure Laterality Date  . BUBBLE STUDY  03/11/2020   Procedure: BUBBLE STUDY;  Surgeon: [name], MD;  Location: [facility];  Service: Cardiovascular;;  . CARDIOVERSION N/A 05/13/2018   Procedure: CARDIOVERSION;  Surgeon: [name], MD;  Location: [facility] ENDOSCOPY;  Service: Cardiovascular;  Laterality: N/A;  . CARDIOVERSION N/A 06/13/2018   Procedure: CARDIOVERSION;  Surgeon: [name], MD;  Location: [facility] ENDOSCOPY;  Service: Cardiovascular;  Laterality: N/A;  . CARDIOVERSION N/A 06/23/2018   Procedure: CARDIOVERSION;  Surgeon: [name], MD;  Location: [facility] ENDOSCOPY;  Service: Cardiovascular;  Laterality: N/A;  . CARDIOVERSION N/A 03/11/2020   Procedure: CARDIOVERSION;  Surgeon: [name], MD;  Location: [facility];  Service: Cardiovascular;  Laterality: N/A;  . excision of actinic keratosis    . EYE SURGERY     eye lift  . IR CT HEAD LTD  01/27/2020  . IR PERCUTANEOUS ART THROMBECTOMY/INFUSION INTRACRANIAL INC DIAG ANGIO  01/27/2020      . IR PERCUTANEOUS ART  THROMBECTOMY/INFUSION INTRACRANIAL INC DIAG ANGIO  01/27/2020  . RADIOLOGY WITH ANESTHESIA N/A 01/27/2020   Procedure: IR WITH ANESTHESIA;  Surgeon: Radiologist, Medication, MD;  Location: [facility];  Service: Radiology;  Laterality: N/A;  . TEE WITHOUT CARDIOVERSION N/A 03/11/2020   Procedure: TRANSESOPHAGEAL ECHOCARDIOGRAM (TEE);  Surgeon: [name], MD;  Location: [facility];  Service: Cardiovascular;  Laterality: N/A;  . TONSILLECTOMY    . WISDOM TOOTH EXTRACTION      There were no vitals filed for this visit.   Subjective Assessment - 08/11/20 1034    Subjective Jim sat down and brought out his homework. Form from [place] was faxed today with pt's signature.    Patient is accompained by: --   Jeral Fruit - aide                ADULT SLP TREATMENT - 08/11/20 1444      General Information   Behavior/Cognition Alert;Cooperative;Pleasant mood      Treatment Provided   Treatment provided Cognitive-Linquistic      Cognitive-Linquistic Treatment   Treatment focused on Apraxia;Aphasia    Skilled Treatment [name] arrived today with homework in hand, with a limerick he had written in the past and not a new one. He did also show SLP plans for a New Year's Eve party which had a note at the top: "[name] told me all this". Note included invited guests, time start, and roasted oysters and "i don't know" for food. Today SLP assisted [name]  to tell SLP more food items - pt retrieved his phone and looked at pictures from party in 2020 and stated "toast", red grapes" and "cheese" independently. He req'd extra time for "butter", and "radishes". He also told SLP two oyster dips and indicated SLP had them misunderstood by gesturing to SLP to separate the ingredients into two separate dips. After this, SLP had pt review sentences from last session re: oysters and sculpture with mask. SLP educated Jody about home tasks and how and when to incorporate reading into practice (during role play is best time, or in the instance  [name] wants to talk about a topic routinely). Suggested [name] say a sentence or two for his party as welcome, or a toast, told him he could role play this and use the written sentences if he gets stuck. on the way out, [name] stated "Happy New Year" with functional articulation.      Assessment / Recommendations / Plan   Plan Continue with current plan of care      Progression Toward Goals   Progression toward goals Progressing toward goals            SLP Education - 08/11/20 1452    Education Details home tasks, role play for party toast    Person(s) Educated Patient;Caregiver(s)    Methods Explanation    Comprehension Verbalized understanding            SLP Short Term Goals - 08/08/20 1207      SLP SHORT TERM GOAL #1   Title Pt will name family, friends and art items with occasional min A 8/10 with approximations allowed.    Time 1    Period Weeks    Status Partially Met      SLP SHORT TERM GOAL #2   Title Pt will name 7 items in personally relevant categories with occasional min A over 2 sessions    Time 1    Period Weeks    Status Partially Met      SLP SHORT TERM GOAL #3   Title Pt will perform 4 automatic speech tasks with rare min A.    Time 1    Period Weeks    Status Partially Met      SLP SHORT TERM GOAL #4   Title Pt will write personal information (name/address/phone), 5 family/friend/pet names and 7  professional words with occasional min A    Time 1    Period Weeks    Status Not Met      SLP SHORT TERM GOAL #5   Title Pt will use mulitmodal communication (gesture, draw, write 1st letter etc) to augment verbal expression to meet needs at home with rare min A from family.    Time 1    Period Weeks    Status Partially Met            SLP Long Term Goals - 08/11/20 1454      SLP LONG TERM GOAL #1   Title Pt will verbalize 3 words to explain or describe his artwork and hobbies with occasional min A over 3 sessions, allowing for intellgible approximations     Baseline 05-17-20 (Lingraphica cues), 05-24-20 (lingraphica), 05-31-20 (lingraphica); 07/25/20 (written word)    Status Achieved      SLP LONG TERM GOAL #2   Title Pt and family will utilize multimodal compensations for aphasia and verbal apraxia to participate in 3 turns each of conversation with occasional min A over 2 sessions  Baseline 07/25/20;    Time 1    Period Weeks    Status Achieved      SLP LONG TERM GOAL #3   Title Pt will write 3 word phrases with less than 2 errors with rare min A over 3 sessions    Status Not Met      SLP LONG TERM GOAL #4   Title Reading assessment if indicated    Status Deferred      SLP LONG TERM GOAL #5   Title [name] will use multimodal communication spontaneously  (writing, gesture, device) to augment verbal expression as needed 3/5 times with rare min A    Time 7    Period Weeks    Status On-going      SLP LONG TERM GOAL #6   Title [name] will write (approximate) 4 items in personally relevant category that is understood by family/friends    Time 7    Period Weeks    Status On-going      SLP LONG TERM GOAL #7   Title Pt will employ multimodal communication to participate in 6 turns in conversation with extended time and occasional min A    Time 7    Period Weeks    Status On-going            Plan - 08/11/20 1453    Clinical Impression Statement Improving moderate Broca's aphasia and severe verbal apraxia persist, affecting communication of basic wants, needs and social interactions.[name] is highly motivated and 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 His utterance length in improving and sentences with accurate syntax are emerging with extended time and significant halting. Ongoing training in multimodal communication - . Continue skilled ST to maximize communication for QOL, safety and reduce family burden.    Speech Therapy Frequency 2x / week    Duration --   20 weeks or 40 visits    Treatment/Interventions Environmental controls;Cueing hierarchy;SLP instruction and feedback;Compensatory strategies;Functional tasks;Cognitive reorganization;Compensatory techniques;Internal/external aids;Multimodal communcation approach;Patient/family education;Language facilitation           Patient will benefit from skilled therapeutic intervention in order to improve the following deficits and impairments:   Verbal apraxia  Aphasia    Problem List Patient Active Problem List   Diagnosis Date Noted  . Benign essential HTN 06/09/2020  . Abnormality of gait 04/28/2020  . Neurogenic bladder 03/17/2020  . Sepsis due to pneumonia ([place]) 03/06/2020  . History of stroke with residual deficit 03/06/2020  . Transient hypotension 03/06/2020  . Spastic hemiplegia affecting nondominant side ([place])   . History of hypertension   . Expressive aphasia   . AKI (acute kidney injury) ([place])   . Global aphasia   . Hypoalbuminemia due to protein-calorie malnutrition ([place])   . Dysphagia, post-stroke   . Hyperlipidemia 02/02/2020  . Dysphagia following cerebral infarction 02/02/2020  . Aspiration pneumonia ([place]) 02/02/2020  . Acute ischemic left MCA stroke ([place]) 02/02/2020  . Acute respiratory failure ([place])   . Acute ischemic stroke [facility]) s/p clot retrieval L MCA & ACA A3 01/27/2020  . Aspiration into airway 01/27/2020  . Chronic anticoagulation 01/27/2020  . Paroxysmal atrial fibrillation ([place]) 06/15/2018  . Essential hypertension 06/15/2018  . NICM (nonischemic cardiomyopathy) ([place]) 06/15/2018  . Visit for monitoring Tikosyn therapy 06/12/2018    [facility], [place]  08/11/2020, 2:54 PM  [facility] [address] [place], [place], [address] Phone: [phone]   Fax:  [phone]  Name: CORIN TILLY MRN: [id number] Date of Birth: [birth year]

## 2020-08-14 ENCOUNTER — Other Ambulatory Visit: Payer: Self-pay | Admitting: Physical Medicine & Rehabilitation

## 2020-08-15 ENCOUNTER — Encounter: Payer: Self-pay | Admitting: Physical Therapy

## 2020-08-15 ENCOUNTER — Ambulatory Visit: Payer: BC Managed Care – PPO | Admitting: Speech Pathology

## 2020-08-15 ENCOUNTER — Ambulatory Visit: Payer: BC Managed Care – PPO | Attending: Physician Assistant | Admitting: Physical Therapy

## 2020-08-15 ENCOUNTER — Other Ambulatory Visit: Payer: Self-pay

## 2020-08-15 DIAGNOSIS — R4701 Aphasia: Secondary | ICD-10-CM | POA: Insufficient documentation

## 2020-08-15 DIAGNOSIS — R262 Difficulty in walking, not elsewhere classified: Secondary | ICD-10-CM | POA: Insufficient documentation

## 2020-08-15 DIAGNOSIS — R29818 Other symptoms and signs involving the nervous system: Secondary | ICD-10-CM | POA: Diagnosis present

## 2020-08-15 DIAGNOSIS — I69351 Hemiplegia and hemiparesis following cerebral infarction affecting right dominant side: Secondary | ICD-10-CM | POA: Insufficient documentation

## 2020-08-15 DIAGNOSIS — R2681 Unsteadiness on feet: Secondary | ICD-10-CM | POA: Diagnosis present

## 2020-08-15 DIAGNOSIS — R278 Other lack of coordination: Secondary | ICD-10-CM | POA: Diagnosis present

## 2020-08-15 DIAGNOSIS — M6281 Muscle weakness (generalized): Secondary | ICD-10-CM | POA: Diagnosis present

## 2020-08-15 DIAGNOSIS — R482 Apraxia: Secondary | ICD-10-CM

## 2020-08-15 DIAGNOSIS — R2689 Other abnormalities of gait and mobility: Secondary | ICD-10-CM | POA: Diagnosis present

## 2020-08-15 NOTE — Therapy (Signed)
[facility] [address] [place], [place], [address] Phone: [phone]   Fax:  [phone]  Physical Therapy Treatment  Patient Details  Name: Dustin Schneider MRN: [id number] Date of Birth: [birth year] Referring Provider (PT): Lauraine Rinne, PA-C (followed by Dr. Delice Lesch)   Encounter Date: 08/15/2020   PT End of Session - 08/15/20 1229    Visit Number 5    Number of Visits 48    Date for PT Re-Evaluation 09/01/20    Authorization Type BCBS    PT Start Time [phone]   pt late because of bad weather   PT Stop Time 1015    PT Time Calculation (min) 38 min    Equipment Utilized During Treatment Gait belt    Activity Tolerance Patient tolerated treatment well    Behavior During Therapy [facility] for tasks assessed/performed           Past Medical History:  Diagnosis Date  . Hypertension   . NICM (nonischemic cardiomyopathy) ([place]) 06/15/2018   04/23/18: EF 45%, 09/15/18: NOrmal LVEF  . Paroxysmal atrial fibrillation ([place]) 06/15/2018  . Stroke ([place])   . Visit for monitoring Tikosyn therapy 06/12/2018    Past Surgical History:  Procedure Laterality Date  . BUBBLE STUDY  03/11/2020   Procedure: BUBBLE STUDY;  Surgeon: [name], MD;  Location: [facility];  Service: Cardiovascular;;  . CARDIOVERSION N/A 05/13/2018   Procedure: CARDIOVERSION;  Surgeon: [name], MD;  Location: [facility] ENDOSCOPY;  Service: Cardiovascular;  Laterality: N/A;  . CARDIOVERSION N/A 06/13/2018   Procedure: CARDIOVERSION;  Surgeon: [name], MD;  Location: [facility] ENDOSCOPY;  Service: Cardiovascular;  Laterality: N/A;  . CARDIOVERSION N/A 06/23/2018   Procedure: CARDIOVERSION;  Surgeon: [name], MD;  Location: [facility] ENDOSCOPY;  Service: Cardiovascular;  Laterality: N/A;  . CARDIOVERSION N/A 03/11/2020   Procedure: CARDIOVERSION;  Surgeon: [name], MD;  Location: [facility];  Service: Cardiovascular;  Laterality: N/A;  . excision of actinic  keratosis    . EYE SURGERY     eye lift  . IR CT HEAD LTD  01/27/2020  . IR PERCUTANEOUS ART THROMBECTOMY/INFUSION INTRACRANIAL INC DIAG ANGIO  01/27/2020      . IR PERCUTANEOUS ART THROMBECTOMY/INFUSION INTRACRANIAL INC DIAG ANGIO  01/27/2020  . RADIOLOGY WITH ANESTHESIA N/A 01/27/2020   Procedure: IR WITH ANESTHESIA;  Surgeon: Radiologist, Medication, MD;  Location: [name];  Service: Radiology;  Laterality: N/A;  . TEE WITHOUT CARDIOVERSION N/A 03/11/2020   Procedure: TRANSESOPHAGEAL ECHOCARDIOGRAM (TEE);  Surgeon: [name], MD;  Location: [facility];  Service: Cardiovascular;  Laterality: N/A;  . TONSILLECTOMY    . WISDOM TOOTH EXTRACTION      There were no vitals filed for this visit.   Subjective Assessment - 08/15/20 0944    Subjective Has been needing to side step into one of the bathrooms because he can't bring his walker forward.    Patient is accompained by: --   caregiver Jody   Pertinent History PMH: HTN, paroxysmal a fib    Limitations Standing;Walking    Patient Stated Goals "wants everything working on R side again"    Currently in Pain? No/denies    Pain Onset 1 to 4 weeks ago                             [facility] Adult PT Treatment/Exercise - 08/15/20 0001      Ambulation/Gait   Ambulation/Gait Yes  Ambulation/Gait Assistance 4: Min guard;4: Min assist    Ambulation/Gait Assistance Details cues to stay closer to RW at times, and manual/tactile cues for incr R pelvic protraction for incr R hip flexion. pt initially performing with incr R hip external rotation, decr with incr gait distance and with cues to slow down and focus on foot clearance. during bout of gait had 2 mats set out on the floor for unlevel surface with pt able to perform with min guard    Ambulation Distance (Feet) 345 Feet    Assistive device Rolling walker;Other (Comment)   R hand orthosis, R AFO   Gait Pattern Step-through pattern;Decreased hip/knee flexion - right;Decreased stance  time - right    Ambulation Surface Level;Indoor    Stairs Yes    Stairs Assistance 4: Min guard    Stairs Assistance Details (indicate cue type and reason) initial reminder cues for descending (stepping down first with weaker RLE), cues at times to slow down and make sure R foot is fully on the step before stepping up with LLE    Stair Management Technique One rail Left;Step to pattern    Number of Stairs 16   4 steps x4 reps   Height of Stairs 6    Gait Comments provided pt with new tennis balls for RW      Exercises   Exercises Other Exercises    Other Exercises  performed supine R hip internal rotation stretch 3 x 30 seconds, and had pt perform with RLE extended and bringing RLE into hip IR and holding for approx. 10 seconds, performed x10 reps with pt having incr motion each time, discussed as a way for pt to also perform at home.      Knee/Hip Exercises: Standing   Lateral Step Up Right;1 set;10 reps;Hand Hold: 2;Hand Hold: 1;20 reps;Step Height: 6"    Lateral Step Up Limitations manual assist for RLE to maintain proper positioning, pt needing min A for inital rep and then remainder min guard, cues for glute activation and posture when stepping LLE up    Other Standing Knee Exercises in // bars: side stepping down and back x4 reps, needing min A for RLE foot clearance when stepping towards L, when stepping towards R cues to keep R foot in neutral for hip ABD activation and to prevent R hip ER                  PT Education - 08/15/20 1227    Education Details performing self right hip stretch in IR in supine position, how to make appt at [employer] to get a leather toe cap on RLE    Person(s) Educated Patient   pt's gf [name]   Methods Explanation;Demonstration    Comprehension Verbalized understanding;Returned demonstration            PT Short Term Goals - 07/28/20 0855      PT SHORT TERM GOAL #1   Title Pt will be mod I with all transfers with use of right AFO once he  receives it for improved mobility.    Baseline Mod-I with all transfers 07/01/20    Time 4    Period Weeks    Status Achieved    Target Date 07/03/20      PT SHORT TERM GOAL #2   Title Pt will be able to ambulate > 345' with RW with right AFO and hand grip attachment min assist for improved mobility.    Baseline 345' with RW w/  hand grip and R AFO with min A    Time 4    Period Weeks    Status Achieved    Target Date 07/03/20      PT SHORT TERM GOAL #3   Title Pt will decrease 5 x sit to stand from 17.98 sec to <15 sec for improved balance and functional strength.    Baseline 06/03/20 17.98 sec from mat with hand; 16.4 s with hands from mat 07/01/20; 12.75 seconds with LUE support from mat, supervision, 13.6 seconds no UE support on 07/28/20    Time 4    Period Weeks    Status Achieved    Target Date 07/31/20      PT SHORT TERM GOAL #4   Title Pt will be instructed in proper AFO wear when new brace received.    Baseline met    Time 4    Period Weeks    Status Achieved    Target Date 07/31/20             PT Long Term Goals - 06/03/20 1057      PT LONG TERM GOAL #1   Title Pt and pt's family members will be independent with final HEP in order to build upon functional gains made in therapy. ALL LTGS DUE 09/01/20    Baseline Pt has been performing current HEP with family but PT continues to add to it 06/03/20    Time 12    Period Weeks    Status On-going    Target Date 09/01/20      PT LONG TERM GOAL #2   Title Pt will ambulate >500' on varied surfaces supervision with RW and R AFO for improved household mobility and short community distances.    Baseline 230' with RW with right hand grip attachment and R AFO min assist on 06/03/20    Time 12    Period Weeks    Status Revised    Target Date 09/01/20      PT LONG TERM GOAL #3   Title Pt will decrease TUG from 27 sec to <20 sec for improved balance and decreased fall risk.    Baseline 06/03/20 TUG=27 sec with RW     Time 12    Period Weeks    Status New    Target Date 09/01/20      PT LONG TERM GOAL #4   Title Pt will be able to ambulate up/down curb with RW and R AFO CGA for improved community access.    Time 12    Period Weeks    Status New    Target Date 09/01/20                 Plan - 08/15/20 1238    Clinical Impression Statement Pt initially with increased R hip external rotation during gait, did get better towards end of session with incr bouts of gait and with cues to focus on foot clearance. Pt and pt's gf may look into getting a leather toe cap from [employer] (therapist providing instructions) for pt's R shoe to help with foot clearance esp when pt fatigues. Will continue to progress towards LTGs.    Personal Factors and Comorbidities Comorbidity 3+    Comorbidities dense L MCA CVA, paroxysmal A. fib on chronic Xarelto, nonischemic cardiomyopathy and hypertension.    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, works as an Chief of Staff  Decision Making Evolving/Moderate complexity    Rehab Potential Good    PT Frequency 2x / week    PT Duration 12 weeks    PT Treatment/Interventions ADLs/Self Care Home Management;Aquatic Therapy;DME Instruction;Gait training;Stair training;Functional mobility training;Therapeutic activities;Therapeutic exercise;Electrical Stimulation;Balance training;Neuromuscular re-education;Wheelchair mobility training;Orthotic Fit/Training;Patient/family education;Passive range of motion;Energy conservation    PT Next Visit Plan review hip IR stretch (i gave him one in supine as he did not recall the one you did last time), stair training, obstacle negotiation, gait outdoors and over unlevel surfaces, curb training with RW. RLE NMR with decr UE support, balance on  compliant surfaces.    PT Home Exercise Plan on 06/28/20 - added standing at countetop and stepping LLE out and in    Consulted and Agree with Plan of Care Patient;Family member/caregiver    Family Member Consulted gf, [name]           Patient will benefit from skilled therapeutic intervention in order to improve the following deficits and impairments:  Abnormal gait,Decreased activity tolerance,Decreased balance,Decreased cognition,Decreased mobility,Decreased coordination,Decreased range of motion,Decreased endurance,Decreased strength,Hypomobility,Difficulty walking,Impaired UE functional use,Impaired vision/preception,Postural dysfunction,Impaired sensation  Visit Diagnosis: Hemiplegia and hemiparesis following cerebral infarction affecting right dominant side ([place])  Other lack of coordination  Unsteadiness on feet  Other symptoms and signs involving the nervous system     Problem List Patient Active Problem List   Diagnosis Date Noted  . Benign essential HTN 06/09/2020  . Abnormality of gait 04/28/2020  . Neurogenic bladder 03/17/2020  . Sepsis due to pneumonia ([place]) 03/06/2020  . History of stroke with residual deficit 03/06/2020  . Transient hypotension 03/06/2020  . Spastic hemiplegia affecting nondominant side ([place])   . History of hypertension   . Expressive aphasia   . AKI (acute kidney injury) ([place])   . Global aphasia   . Hypoalbuminemia due to protein-calorie malnutrition ([place])   . Dysphagia, post-stroke   . Hyperlipidemia 02/02/2020  . Dysphagia following cerebral infarction 02/02/2020  . Aspiration pneumonia ([place]) 02/02/2020  . Acute ischemic left MCA stroke ([place]) 02/02/2020  . Acute respiratory failure ([place])   . Acute ischemic stroke [facility]) s/p clot retrieval L MCA & ACA A3 01/27/2020  . Aspiration into airway 01/27/2020  . Chronic anticoagulation 01/27/2020  . Paroxysmal atrial fibrillation ([place]) 06/15/2018  . Essential hypertension 06/15/2018  . NICM  (nonischemic cardiomyopathy) ([place]) 06/15/2018  . Visit for monitoring Tikosyn therapy 06/12/2018    Arliss Journey, PT, DPT  08/15/2020, 12:39 PM  [facility] [address] [place], [place], [address] Phone: [phone]   Fax:  [phone]  Name: Dustin Schneider MRN: [id number] Date of Birth: August 29, 1955

## 2020-08-15 NOTE — Therapy (Signed)
[facility] [address] [place], [place], [address] Phone: [phone]   Fax:  [phone]  Speech Language Pathology Treatment  Patient Details  Name: Dustin Schneider MRN: [id number] Date of Birth: [birth year] Referring Provider (SLP): Dr. Delice Lesch   Encounter Date: 08/15/2020   End of Session - 08/15/20 1106    Visit Number 17    Number of Visits 25    Date for SLP Re-Evaluation 09/19/20    SLP Start Time 1017    SLP Stop Time  3    SLP Time Calculation (min) 45 min    Activity Tolerance Patient tolerated treatment well           Past Medical History:  Diagnosis Date  . Hypertension   . NICM (nonischemic cardiomyopathy) ([place]) 06/15/2018   04/23/18: EF 45%, 09/15/18: NOrmal LVEF  . Paroxysmal atrial fibrillation ([place]) 06/15/2018  . Stroke ([place])   . Visit for monitoring Tikosyn therapy 06/12/2018    Past Surgical History:  Procedure Laterality Date  . BUBBLE STUDY  03/11/2020   Procedure: BUBBLE STUDY;  Surgeon: [name], MD;  Location: [facility];  Service: Cardiovascular;;  . CARDIOVERSION N/A 05/13/2018   Procedure: CARDIOVERSION;  Surgeon: [name], MD;  Location: [facility] ENDOSCOPY;  Service: Cardiovascular;  Laterality: N/A;  . CARDIOVERSION N/A 06/13/2018   Procedure: CARDIOVERSION;  Surgeon: [name], MD;  Location: [facility] ENDOSCOPY;  Service: Cardiovascular;  Laterality: N/A;  . CARDIOVERSION N/A 06/23/2018   Procedure: CARDIOVERSION;  Surgeon: [name], MD;  Location: [facility] ENDOSCOPY;  Service: Cardiovascular;  Laterality: N/A;  . CARDIOVERSION N/A 03/11/2020   Procedure: CARDIOVERSION;  Surgeon: [name], MD;  Location: [facility];  Service: Cardiovascular;  Laterality: N/A;  . excision of actinic keratosis    . EYE SURGERY     eye lift  . IR CT HEAD LTD  01/27/2020  . IR PERCUTANEOUS ART THROMBECTOMY/INFUSION INTRACRANIAL INC DIAG ANGIO  01/27/2020      . IR PERCUTANEOUS ART  THROMBECTOMY/INFUSION INTRACRANIAL INC DIAG ANGIO  01/27/2020  . RADIOLOGY WITH ANESTHESIA N/A 01/27/2020   Procedure: IR WITH ANESTHESIA;  Surgeon: Radiologist, Medication, MD;  Location: [facility];  Service: Radiology;  Laterality: N/A;  . TEE WITHOUT CARDIOVERSION N/A 03/11/2020   Procedure: TRANSESOPHAGEAL ECHOCARDIOGRAM (TEE);  Surgeon: [name], MD;  Location: [facility];  Service: Cardiovascular;  Laterality: N/A;  . TONSILLECTOMY    . WISDOM TOOTH EXTRACTION      There were no vitals filed for this visit.   Subjective Assessment - 08/15/20 1029    Patient is accompained by: Family member   [name]   Currently in Pain? No/denies                 ADULT SLP TREATMENT - 08/15/20 1030      General Information   Behavior/Cognition Alert;Cooperative;Pleasant mood      Treatment Provided   Treatment provided Cognitive-Linquistic      Cognitive-Linquistic Treatment   Treatment focused on Aphasia;Apraxia;Patient/family/caregiver education    Skilled Treatment Dustin Schneider used written cues of menu to verbalize [employer] and wrote part of a toast that was given. In conversation re: sailing trip he took, Dustin Schneider used writting, word approximation and photo on his phone, as well as internet to communicate about his trip with extended time and usual mod questioning cues. Dustin Schneider answered questions re: sailing trips with multimodal communication of gestures, writing and verbal approximations with occasional min A.  Assessment / Recommendations / Plan   Plan Continue with current plan of care      Progression Toward Goals   Progression toward goals Progressing toward goals            SLP Education - 08/15/20 1104    Education Details onging use of multimodal communication    Person(s) Educated Patient;Caregiver(s)    Methods Explanation;Demonstration;Verbal cues    Comprehension Returned demonstration;Verbal cues required            SLP Short Term Goals - 08/15/20 1105      SLP  SHORT TERM GOAL #1   Title Pt will name family, friends and art items with occasional min A 8/10 with approximations allowed.    Time 1    Period Weeks    Status Partially Met      SLP SHORT TERM GOAL #2   Title Pt will name 7 items in personally relevant categories with occasional min A over 2 sessions    Time 1    Period Weeks    Status Partially Met      SLP SHORT TERM GOAL #3   Title Pt will perform 4 automatic speech tasks with rare min A.    Time 1    Period Weeks    Status Partially Met      SLP SHORT TERM GOAL #4   Title Pt will write personal information (name/address/phone), 5 family/friend/pet names and 7  professional words with occasional min A    Time 1    Period Weeks    Status Not Met      SLP SHORT TERM GOAL #5   Title Pt will use mulitmodal communication (gesture, draw, write 1st letter etc) to augment verbal expression to meet needs at home with rare min A from family.    Time 1    Period Weeks    Status Partially Met            SLP Long Term Goals - 08/15/20 1105      SLP LONG TERM GOAL #1   Title Pt will verbalize 3 words to explain or describe his artwork and hobbies with occasional min A over 3 sessions, allowing for intellgible approximations    Baseline 05-17-20 (Lingraphica cues), 05-24-20 (lingraphica), 05-31-20 (lingraphica); 07/25/20 (written word)    Status Achieved      SLP LONG TERM GOAL #2   Title Pt and family will utilize multimodal compensations for aphasia and verbal apraxia to participate in 3 turns each of conversation with occasional min A over 2 sessions    Baseline 07/25/20;    Time 1    Period Weeks    Status Achieved      SLP LONG TERM GOAL #3   Title Pt will write 3 word phrases with less than 2 errors with rare min A over 3 sessions    Status Not Met      SLP LONG TERM GOAL #4   Title Reading assessment if indicated    Status Deferred      SLP LONG TERM GOAL #5   Title Dustin Schneider will use multimodal communication  spontaneously  (writing, gesture, device) to augment verbal expression as needed 3/5 times with rare min A    Time 6    Period Weeks    Status On-going      SLP LONG TERM GOAL #6   Title Dustin Schneider will write (approximate) 4 items in personally relevant category that is understood by family/friends  Time 6    Period Weeks    Status On-going      SLP LONG TERM GOAL #7   Title Pt will employ multimodal communication to participate in 6 turns in conversation with extended time and occasional min A    Time 6    Period Weeks    Status On-going            Plan - 08/15/20 1105    Clinical Impression Statement Improving moderate Broca's aphasia and severe verbal apraxia persist, affecting communication of basic wants, needs and social interactions.Dustin Schneider is highly motivated and 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 His utterance length in improving and sentences with accurate syntax are emerging with extended time and significant halting. Ongoing training in multimodal communication - . Continue skilled ST to maximize communication for QOL, safety and reduce family burden.    Speech Therapy Frequency 2x / week    Duration --   20 weeks or 40 visits   Treatment/Interventions Environmental controls;Cueing hierarchy;SLP instruction and feedback;Compensatory strategies;Functional tasks;Cognitive reorganization;Compensatory techniques;Internal/external aids;Multimodal communcation approach;Patient/family education;Language facilitation    Potential to Achieve Goals Good           Patient will benefit from skilled therapeutic intervention in order to improve the following deficits and impairments:   Aphasia  Verbal apraxia    Problem List Patient Active Problem List   Diagnosis Date Noted  . Benign essential HTN 06/09/2020  . Abnormality of gait 04/28/2020  . Neurogenic bladder 03/17/2020  . Sepsis due to pneumonia ([place]) 03/06/2020  .  History of stroke with residual deficit 03/06/2020  . Transient hypotension 03/06/2020  . Spastic hemiplegia affecting nondominant side ([place])   . History of hypertension   . Expressive aphasia   . AKI (acute kidney injury) ([place])   . Global aphasia   . Hypoalbuminemia due to protein-calorie malnutrition ([place])   . Dysphagia, post-stroke   . Hyperlipidemia 02/02/2020  . Dysphagia following cerebral infarction 02/02/2020  . Aspiration pneumonia ([place]) 02/02/2020  . Acute ischemic left MCA stroke ([place]) 02/02/2020  . Acute respiratory failure ([place])   . Acute ischemic stroke [facility]) s/p clot retrieval L MCA & ACA A3 01/27/2020  . Aspiration into airway 01/27/2020  . Chronic anticoagulation 01/27/2020  . Paroxysmal atrial fibrillation ([place]) 06/15/2018  . Essential hypertension 06/15/2018  . NICM (nonischemic cardiomyopathy) ([place]) 06/15/2018  . Visit for monitoring Tikosyn therapy 06/12/2018    Maebelle Sulton, Annye Rusk MS, CCC-SLP 08/15/2020, 11:07 AM  [facility] [address] [place], [place], [address] Phone: [phone]   Fax:  [phone]   Name: Dustin Schneider MRN: [id number] Date of Birth: [birth year]

## 2020-08-19 ENCOUNTER — Ambulatory Visit: Payer: BC Managed Care – PPO | Admitting: Speech Pathology

## 2020-08-19 ENCOUNTER — Other Ambulatory Visit: Payer: Self-pay

## 2020-08-19 ENCOUNTER — Encounter: Payer: Self-pay | Admitting: Speech Pathology

## 2020-08-19 ENCOUNTER — Ambulatory Visit: Payer: BC Managed Care – PPO

## 2020-08-19 DIAGNOSIS — R2689 Other abnormalities of gait and mobility: Secondary | ICD-10-CM

## 2020-08-19 DIAGNOSIS — M6281 Muscle weakness (generalized): Secondary | ICD-10-CM

## 2020-08-19 DIAGNOSIS — R4701 Aphasia: Secondary | ICD-10-CM

## 2020-08-19 DIAGNOSIS — I69351 Hemiplegia and hemiparesis following cerebral infarction affecting right dominant side: Secondary | ICD-10-CM | POA: Diagnosis not present

## 2020-08-19 DIAGNOSIS — R482 Apraxia: Secondary | ICD-10-CM

## 2020-08-19 NOTE — Patient Instructions (Signed)
   Invite the aphasia group to the art show  For the show:  Write down some things you want to share about each work and practice it  Also write down some answers to FAQ's you may expect from people  Practice  holding down the voice button and sending a voice message via text if this is helpful   Make a list of phrases/responses to texts or FB that you could copy from to help you respond   [place] job using google to help you communicate!! Keep it up!

## 2020-08-19 NOTE — Therapy (Signed)
[facility] [address] [place], [place], [address] Phone: [phone]   Fax:  [phone]  Speech Language Pathology Treatment  Patient Details  Name: Dustin Schneider MRN: [id number] Date of Birth: 1956-03-28 Referring Provider (SLP): Dr. Delice Lesch   Encounter Date: 08/19/2020   End of Session - 08/19/20 1342    Visit Number 18    Number of Visits 25    Date for SLP Re-Evaluation 09/19/20    SLP Start Time 6962    SLP Stop Time  1057    SLP Time Calculation (min) 42 min    Activity Tolerance Patient tolerated treatment well           Past Medical History:  Diagnosis Date  . Hypertension   . NICM (nonischemic cardiomyopathy) ([place]) 06/15/2018   04/23/18: EF 45%, 09/15/18: NOrmal LVEF  . Paroxysmal atrial fibrillation ([place]) 06/15/2018  . Stroke ([place])   . Visit for monitoring Tikosyn therapy 06/12/2018    Past Surgical History:  Procedure Laterality Date  . BUBBLE STUDY  03/11/2020   Procedure: BUBBLE STUDY;  Surgeon: [name], MD;  Location: [facility];  Service: Cardiovascular;;  . CARDIOVERSION N/A 05/13/2018   Procedure: CARDIOVERSION;  Surgeon: [name], MD;  Location: [facility] ENDOSCOPY;  Service: Cardiovascular;  Laterality: N/A;  . CARDIOVERSION N/A 06/13/2018   Procedure: CARDIOVERSION;  Surgeon: [name], MD;  Location: [facility] ENDOSCOPY;  Service: Cardiovascular;  Laterality: N/A;  . CARDIOVERSION N/A 06/23/2018   Procedure: CARDIOVERSION;  Surgeon: [name], MD;  Location: [facility] ENDOSCOPY;  Service: Cardiovascular;  Laterality: N/A;  . CARDIOVERSION N/A 03/11/2020   Procedure: CARDIOVERSION;  Surgeon: [name], MD;  Location: [facility];  Service: Cardiovascular;  Laterality: N/A;  . excision of actinic keratosis    . EYE SURGERY     eye lift  . IR CT HEAD LTD  01/27/2020  . IR PERCUTANEOUS ART THROMBECTOMY/INFUSION INTRACRANIAL INC DIAG ANGIO  01/27/2020      . IR PERCUTANEOUS ART  THROMBECTOMY/INFUSION INTRACRANIAL INC DIAG ANGIO  01/27/2020  . RADIOLOGY WITH ANESTHESIA N/A 01/27/2020   Procedure: IR WITH ANESTHESIA;  Surgeon: Radiologist, Medication, MD;  Location: [facility];  Service: Radiology;  Laterality: N/A;  . TEE WITHOUT CARDIOVERSION N/A 03/11/2020   Procedure: TRANSESOPHAGEAL ECHOCARDIOGRAM (TEE);  Surgeon: [name], MD;  Location: [facility];  Service: Cardiovascular;  Laterality: N/A;  . TONSILLECTOMY    . WISDOM TOOTH EXTRACTION      There were no vitals filed for this visit.   Subjective Assessment - 08/19/20 1026    Subjective "I'm good"    Currently in Pain? 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 He used writing and internet search to provide information re: one of his sculptures. Internet search ID'd [employer]' name and Dustin Schneider was able to approximate verbalizations that the statue is at [employer]. Dustin Schneider is texting with inconsistent accuracy. Demonstrated voice memo texting for short, simple words and instructed Dustin Schneider and Dustin Schneider to generate a list of responses for texts  and FB posts that Dustin Schneider could copy from to improve accuracy of text responses.      Assessment / Recommendations / Plan   Plan Continue with current plan of care      Progression Toward Goals   Progression toward goals Progressing toward goals            SLP Education - 08/19/20 1339    Education Details compensations for aphasia and verbal apraxia; multimodal communication; strategies for texting and respondng to social media    Person(s) Educated Patient;Caregiver(s)    Dustin Schneider   Methods Explanation;Demonstration;Verbal cues    Comprehension Verbalized understanding;Returned demonstration;Verbal cues required;Need further instruction            SLP Short Term Goals - 08/19/20 1341      SLP SHORT TERM GOAL #1   Title Pt will name family, friends and art items with occasional min A 8/10 with approximations allowed.    Time 1    Period Weeks    Status Partially Met      SLP SHORT TERM GOAL #2   Title Pt will name 7 items in personally relevant categories with occasional min A over 2 sessions    Time 1    Period Weeks    Status Partially Met      SLP SHORT TERM GOAL #3   Title Pt will perform 4 automatic speech tasks with rare min A.    Time 1    Period Weeks    Status Partially Met      SLP SHORT TERM GOAL #4   Title Pt will write personal information (name/address/phone), 5 family/friend/pet names and 7  professional words with occasional min A    Time 1    Period Weeks    Status Not Met      SLP SHORT TERM GOAL #5   Title Pt will use mulitmodal communication (gesture, draw, write 1st letter etc) to augment verbal expression to meet needs at home with rare min A from family.    Time 1    Period Weeks    Status Partially Met            SLP Long Term Goals - 08/19/20 1341      SLP LONG TERM GOAL #1   Title Pt will verbalize 3 words to explain or describe his artwork and hobbies with occasional min A over 3 sessions, allowing for intellgible approximations    Baseline 05-17-20 (Lingraphica cues), 05-24-20 (lingraphica), 05-31-20 (lingraphica); 07/25/20 (written word)    Status Achieved      SLP LONG TERM GOAL #2   Title Pt and family will utilize multimodal compensations for aphasia and verbal apraxia to participate in 3 turns each of conversation with occasional min A over 2 sessions    Baseline 07/25/20;    Time 1    Period Weeks    Status Achieved      SLP LONG TERM GOAL #3   Title Pt will write 3 word phrases with less than 2  errors with rare min A over 3 sessions    Status Not Met      SLP LONG TERM GOAL #4   Title Reading assessment if indicated    Status Deferred      SLP LONG TERM GOAL #5   Title Dustin Schneider will use multimodal communication spontaneously  (writing, gesture, device) to augment verbal expression as needed 3/5 times with rare min A    Time 6    Period  Weeks    Status On-going      SLP LONG TERM GOAL #6   Title Dustin Schneider will write (approximate) 4 items in personally relevant category that is understood by family/friends    Time 6    Period Weeks    Status On-going      SLP LONG TERM GOAL #7   Title Pt will employ multimodal communication to participate in 6 turns in conversation with extended time and occasional min A    Time 6    Period Weeks    Status On-going            Plan - 08/19/20 1340    Clinical Impression Statement Improving moderate Broca's aphasia and severe verbal apraxia persist, affecting communication of basic wants, needs and social interactions.Dustin Schneider is highly motivated and 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 His utterance length in improving and sentences with accurate syntax are emerging with extended time and significant halting. Ongoing training in multimodal communication - . Continue skilled ST to maximize communication for QOL, safety and reduce family burden.    Speech Therapy Frequency 2x / week    Duration --   20 weeks or 40 visits   Treatment/Interventions Environmental controls;Cueing hierarchy;SLP instruction and feedback;Compensatory strategies;Functional tasks;Cognitive reorganization;Compensatory techniques;Internal/external aids;Multimodal communcation approach;Patient/family education;Language facilitation    Potential to Achieve Goals Good    Potential Considerations Severity of impairments           Patient will benefit from skilled therapeutic intervention in order to improve the following deficits  and impairments:   Aphasia  Verbal apraxia    Problem List Patient Active Problem List   Diagnosis Date Noted  . Benign essential HTN 06/09/2020  . Abnormality of gait 04/28/2020  . Neurogenic bladder 03/17/2020  . Sepsis due to pneumonia ([place]) 03/06/2020  . History of stroke with residual deficit 03/06/2020  . Transient hypotension 03/06/2020  . Spastic hemiplegia affecting nondominant side ([place])   . History of hypertension   . Expressive aphasia   . AKI (acute kidney injury) ([place])   . Global aphasia   . Hypoalbuminemia due to protein-calorie malnutrition ([place])   . Dysphagia, post-stroke   . Hyperlipidemia 02/02/2020  . Dysphagia following cerebral infarction 02/02/2020  . Aspiration pneumonia ([place]) 02/02/2020  . Acute ischemic left MCA stroke ([place]) 02/02/2020  . Acute respiratory failure ([place])   . Acute ischemic stroke [facility]) s/p clot retrieval L MCA & ACA A3 01/27/2020  . Aspiration into airway 01/27/2020  . Chronic anticoagulation 01/27/2020  . Paroxysmal atrial fibrillation ([place]) 06/15/2018  . Essential hypertension 06/15/2018  . NICM (nonischemic cardiomyopathy) ([place]) 06/15/2018  . Visit for monitoring Tikosyn therapy 06/12/2018    Brynlei Klausner, Annye Rusk MS, CCC-SLP 08/19/2020, 1:43 PM  [facility] [address] [place], [place], [address] Phone: [phone]   Fax:  [phone]   Name: Dustin Schneider MRN: [id number] Date of Birth: [birth year]

## 2020-08-19 NOTE — Therapy (Signed)
[facility] [address] [place], [place], [address] Phone: [phone]   Fax:  [phone]  Physical Therapy Treatment  Patient Details  Name: Dustin Schneider MRN: [id number] Date of Birth: 22-May-1956 Referring Provider (PT): Lauraine Rinne, PA-C (followed by Dr. Delice Lesch)   Encounter Date: 08/19/2020   PT End of Session - 08/19/20 0937    Visit Number 45    Number of Visits 20    Date for PT Re-Evaluation 09/01/20    Authorization Type BCBS    PT Start Time 0935    PT Stop Time 1015    PT Time Calculation (min) 40 min    Equipment Utilized During Treatment Gait belt    Activity Tolerance Patient tolerated treatment well    Behavior During Therapy [facility] for tasks assessed/performed           Past Medical History:  Diagnosis Date  . Hypertension   . NICM (nonischemic cardiomyopathy) ([place]) 06/15/2018   04/23/18: EF 45%, 09/15/18: NOrmal LVEF  . Paroxysmal atrial fibrillation ([place]) 06/15/2018  . Stroke ([place])   . Visit for monitoring Tikosyn therapy 06/12/2018    Past Surgical History:  Procedure Laterality Date  . BUBBLE STUDY  03/11/2020   Procedure: BUBBLE STUDY;  Surgeon: [name], MD;  Location: [place];  Service: Cardiovascular;;  . CARDIOVERSION N/A 05/13/2018   Procedure: CARDIOVERSION;  Surgeon: [name], MD;  Location: [facility] ENDOSCOPY;  Service: Cardiovascular;  Laterality: N/A;  . CARDIOVERSION N/A 06/13/2018   Procedure: CARDIOVERSION;  Surgeon: [name], MD;  Location: [facility] ENDOSCOPY;  Service: Cardiovascular;  Laterality: N/A;  . CARDIOVERSION N/A 06/23/2018   Procedure: CARDIOVERSION;  Surgeon: [name], MD;  Location: [facility] ENDOSCOPY;  Service: Cardiovascular;  Laterality: N/A;  . CARDIOVERSION N/A 03/11/2020   Procedure: CARDIOVERSION;  Surgeon: [name], MD;  Location: [facility];  Service: Cardiovascular;  Laterality: N/A;  . excision of actinic keratosis    . EYE SURGERY     eye  lift  . IR CT HEAD LTD  01/27/2020  . IR PERCUTANEOUS ART THROMBECTOMY/INFUSION INTRACRANIAL INC DIAG ANGIO  01/27/2020      . IR PERCUTANEOUS ART THROMBECTOMY/INFUSION INTRACRANIAL INC DIAG ANGIO  01/27/2020  . RADIOLOGY WITH ANESTHESIA N/A 01/27/2020   Procedure: IR WITH ANESTHESIA;  Surgeon: Radiologist, Medication, MD;  Location: [place];  Service: Radiology;  Laterality: N/A;  . TEE WITHOUT CARDIOVERSION N/A 03/11/2020   Procedure: TRANSESOPHAGEAL ECHOCARDIOGRAM (TEE);  Surgeon: [name], MD;  Location: [place];  Service: Cardiovascular;  Laterality: N/A;  . TONSILLECTOMY    . WISDOM TOOTH EXTRACTION      There were no vitals filed for this visit.   Subjective Assessment - 08/19/20 0937    Subjective Pt just picked up his shoe with toe cap this morning. He walked in bog garden and up a hill with caregiver this week.    Patient is accompained by: --   caregiver Jody   Pertinent History PMH: HTN, paroxysmal a fib    Limitations Standing;Walking    Patient Stated Goals "wants everything working on R side again"    Currently in Pain? No/denies    Pain Onset 1 to 4 weeks ago                             [facility] Adult PT Treatment/Exercise - 08/19/20 0938      Transfers   Transfers Sit to Stand;Stand to  Sit    Sit to Stand 5: Supervision    Stand to Sit 5: Supervision      Ambulation/Gait   Ambulation/Gait Yes    Ambulation/Gait Assistance 5: Supervision;4: Min guard    Ambulation/Gait Assistance Details each lap which was 4 total pt also ambulated over blue mat, red mat and weaved in and out of 5 cones. Toe cap on right shoe now. Tactile cues at pelvis for more right pelvic rotation and verbal cues to keep right foot in walker as does ER hitting back leg at times. Pt was also cued to not step half on/half off mat.    Ambulation Distance (Feet) 460 Feet    Assistive device Rolling walker   right AFO with toe cap   Gait Pattern Step-through pattern;Decreased  hip/knee flexion - right;Decreased stance time - right    Ambulation Surface Level;Unlevel;Indoor    Gait Comments Practiced stepping up/down curb with 4" step in front to closer mimick his 2 steps in home with walker. Verbal cues for sequencing and to keep right hand on grip. Pt was also cued to get feet as close as possible to step prior to moving walker. Did need min assist at times with descent to clear right heel to step.      Neuro Re-ed    Neuro Re-ed Details  In // bars: standing on rockerboard positioned ant/post without UE support x 30 sec, rocking board ant/post x 10 with verbal cues to shift weight from hips more, with LUE stepping forwards to floor then back x 10. Standing on airex marching LLE x 10 the LUE support then x 8 with keeping slight bed in right knee with cues to tighten gluts on right. CGA for safety.                    PT Short Term Goals - 07/28/20 0855      PT SHORT TERM GOAL #1   Title Pt will be mod I with all transfers with use of right AFO once he receives it for improved mobility.    Baseline Mod-I with all transfers 07/01/20    Time 4    Period Weeks    Status Achieved    Target Date 07/03/20      PT SHORT TERM GOAL #2   Title Pt will be able to ambulate > 345' with RW with right AFO and hand grip attachment min assist for improved mobility.    Baseline 345' with RW w/ hand grip and R AFO with min A    Time 4    Period Weeks    Status Achieved    Target Date 07/03/20      PT SHORT TERM GOAL #3   Title Pt will decrease 5 x sit to stand from 17.98 sec to <15 sec for improved balance and functional strength.    Baseline 06/03/20 17.98 sec from mat with hand; 16.4 s with hands from mat 07/01/20; 12.75 seconds with LUE support from mat, supervision, 13.6 seconds no UE support on 07/28/20    Time 4    Period Weeks    Status Achieved    Target Date 07/31/20      PT SHORT TERM GOAL #4   Title Pt will be instructed in proper AFO wear when new  brace received.    Baseline met    Time 4    Period Weeks    Status Achieved    Target Date 07/31/20  PT Long Term Goals - 06/03/20 1057      PT LONG TERM GOAL #1   Title Pt and pt's family members will be independent with final HEP in order to build upon functional gains made in therapy. ALL LTGS DUE 09/01/20    Baseline Pt has been performing current HEP with family but PT continues to add to it 06/03/20    Time 12    Period Weeks    Status On-going    Target Date 09/01/20      PT LONG TERM GOAL #2   Title Pt will ambulate >500' on varied surfaces supervision with RW and R AFO for improved household mobility and short community distances.    Baseline 230' with RW with right hand grip attachment and R AFO min assist on 06/03/20    Time 12    Period Weeks    Status Revised    Target Date 09/01/20      PT LONG TERM GOAL #3   Title Pt will decrease TUG from 27 sec to <20 sec for improved balance and decreased fall risk.    Baseline 06/03/20 TUG=27 sec with RW    Time 12    Period Weeks    Status New    Target Date 09/01/20      PT LONG TERM GOAL #4   Title Pt will be able to ambulate up/down curb with RW and R AFO CGA for improved community access.    Time 12    Period Weeks    Status New    Target Date 09/01/20                 Plan - 08/19/20 1057    Clinical Impression Statement PT worked with gait with new shoe with toe cap on right. Pt was able to demonstrate better RLE advancement with less fatigue with toe cap. When negotiating over different surfaces pt needed cues to clear to next surface completely and not have foot half on/half off. Pt continues to demonstrate improvements in gait.    Personal Factors and Comorbidities Comorbidity 3+    Comorbidities dense L MCA CVA, paroxysmal A. fib on chronic Xarelto, nonischemic cardiomyopathy and hypertension.    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, works as an Electrical engineer Evolving/Moderate complexity    Rehab Potential Good    PT Frequency 2x / week    PT Duration 12 weeks    PT Treatment/Interventions ADLs/Self Care Home Management;Aquatic Therapy;DME Instruction;Gait training;Stair training;Functional mobility training;Therapeutic activities;Therapeutic exercise;Electrical Stimulation;Balance training;Neuromuscular re-education;Wheelchair mobility training;Orthotic Fit/Training;Patient/family education;Passive range of motion;Energy conservation    PT Next Visit Plan review hip IR stretch as I ran out of time with working with new toe cap. stair training, obstacle negotiation, gait outdoors and over unlevel surfaces, curb training with RW. RLE NMR with decr UE support, balance on compliant surfaces.    PT Home Exercise Plan on 06/28/20 - added standing at countetop and stepping LLE out and in    Consulted and Agree with Plan of Care Patient;Family member/caregiver    Family Member Consulted gf, [name]           Patient will benefit from skilled therapeutic intervention in order to improve the following deficits and impairments:  Abnormal gait,Decreased activity tolerance,Decreased balance,Decreased cognition,Decreased mobility,Decreased coordination,Decreased range of motion,Decreased endurance,Decreased strength,Hypomobility,Difficulty walking,Impaired UE functional use,Impaired vision/preception,Postural dysfunction,Impaired sensation  Visit Diagnosis: Other  abnormalities of gait and mobility  Muscle weakness (generalized)  Hemiplegia and hemiparesis following cerebral infarction affecting right dominant side [facility])     Problem List Patient Active Problem List   Diagnosis Date Noted  . Benign essential HTN  06/09/2020  . Abnormality of gait 04/28/2020  . Neurogenic bladder 03/17/2020  . Sepsis due to pneumonia ([place]) 03/06/2020  . History of stroke with residual deficit 03/06/2020  . Transient hypotension 03/06/2020  . Spastic hemiplegia affecting nondominant side ([place])   . History of hypertension   . Expressive aphasia   . AKI (acute kidney injury) ([place])   . Global aphasia   . Hypoalbuminemia due to protein-calorie malnutrition ([place])   . Dysphagia, post-stroke   . Hyperlipidemia 02/02/2020  . Dysphagia following cerebral infarction 02/02/2020  . Aspiration pneumonia ([place]) 02/02/2020  . Acute ischemic left MCA stroke ([place]) 02/02/2020  . Acute respiratory failure ([place])   . Acute ischemic stroke [facility]) s/p clot retrieval L MCA & ACA A3 01/27/2020  . Aspiration into airway 01/27/2020  . Chronic anticoagulation 01/27/2020  . Paroxysmal atrial fibrillation ([place]) 06/15/2018  . Essential hypertension 06/15/2018  . NICM (nonischemic cardiomyopathy) ([place]) 06/15/2018  . Visit for monitoring Tikosyn therapy 06/12/2018    Electa Sniff, PT, DPT, NCS 08/19/2020, 11:00 AM  [facility] [address] [place], [place], [address] Phone: [phone]   Fax:  [phone]  Name: Dustin Schneider MRN: [id number] Date of Birth: 1956/04/24

## 2020-08-22 ENCOUNTER — Other Ambulatory Visit: Payer: Self-pay

## 2020-08-22 ENCOUNTER — Encounter: Payer: Self-pay | Admitting: Speech Pathology

## 2020-08-22 ENCOUNTER — Ambulatory Visit: Payer: BC Managed Care – PPO | Admitting: Speech Pathology

## 2020-08-22 ENCOUNTER — Ambulatory Visit: Payer: BC Managed Care – PPO | Admitting: Physical Therapy

## 2020-08-22 DIAGNOSIS — R2689 Other abnormalities of gait and mobility: Secondary | ICD-10-CM

## 2020-08-22 DIAGNOSIS — I69351 Hemiplegia and hemiparesis following cerebral infarction affecting right dominant side: Secondary | ICD-10-CM

## 2020-08-22 DIAGNOSIS — R482 Apraxia: Secondary | ICD-10-CM

## 2020-08-22 DIAGNOSIS — M6281 Muscle weakness (generalized): Secondary | ICD-10-CM

## 2020-08-22 DIAGNOSIS — R278 Other lack of coordination: Secondary | ICD-10-CM

## 2020-08-22 DIAGNOSIS — R4701 Aphasia: Secondary | ICD-10-CM

## 2020-08-22 DIAGNOSIS — R2681 Unsteadiness on feet: Secondary | ICD-10-CM

## 2020-08-22 NOTE — Therapy (Signed)
Dustin Schneider, Dustin Schneider, Dustin Schneider Phone: [phone]   Fax:  [phone]  Speech Language Pathology Treatment  Patient Details  Name: Dustin Schneider MRN: [id number] Date of Birth: [birth year] Referring Provider (SLP): Dr. Delice Lesch   Encounter Date: 08/22/2020   End of Session - 08/22/20 1120    Visit Number 19    Number of Visits 40    Date for SLP Re-Evaluation 09/19/20    SLP Start Time 5397    SLP Stop Time  1100    SLP Time Calculation (min) 42 min    Activity Tolerance Patient tolerated treatment well           Past Medical History:  Diagnosis Date  . Hypertension   . NICM (nonischemic cardiomyopathy) ([place]) 06/15/2018   04/23/18: EF 45%, 09/15/18: NOrmal LVEF  . Paroxysmal atrial fibrillation ([place]) 06/15/2018  . Stroke ([place])   . Visit for monitoring Tikosyn therapy 06/12/2018    Past Surgical History:  Procedure Laterality Date  . BUBBLE STUDY  03/11/2020   Procedure: BUBBLE STUDY;  Surgeon: [name], MD;  Location: [facility];  Service: Cardiovascular;;  . CARDIOVERSION N/A 05/13/2018   Procedure: CARDIOVERSION;  Surgeon: [name], MD;  Location: [facility] ENDOSCOPY;  Service: Cardiovascular;  Laterality: N/A;  . CARDIOVERSION N/A 06/13/2018   Procedure: CARDIOVERSION;  Surgeon: [name], MD;  Location: [facility] ENDOSCOPY;  Service: Cardiovascular;  Laterality: N/A;  . CARDIOVERSION N/A 06/23/2018   Procedure: CARDIOVERSION;  Surgeon: [name], MD;  Location: [facility] ENDOSCOPY;  Service: Cardiovascular;  Laterality: N/A;  . CARDIOVERSION N/A 03/11/2020   Procedure: CARDIOVERSION;  Surgeon: [name], MD;  Location: [place];  Service: Cardiovascular;  Laterality: N/A;  . excision of actinic keratosis    . EYE SURGERY     eye lift  . IR CT HEAD LTD  01/27/2020  . IR PERCUTANEOUS ART THROMBECTOMY/INFUSION INTRACRANIAL INC DIAG ANGIO  01/27/2020      . IR PERCUTANEOUS ART  THROMBECTOMY/INFUSION INTRACRANIAL INC DIAG ANGIO  01/27/2020  . RADIOLOGY WITH ANESTHESIA N/A 01/27/2020   Procedure: IR WITH ANESTHESIA;  Surgeon: Radiologist, Medication, MD;  Location: [facility];  Service: Radiology;  Laterality: N/A;  . TEE WITHOUT CARDIOVERSION N/A 03/11/2020   Procedure: TRANSESOPHAGEAL ECHOCARDIOGRAM (TEE);  Surgeon: [name], MD;  Location: [place];  Service: Cardiovascular;  Laterality: N/A;  . TONSILLECTOMY    . WISDOM TOOTH EXTRACTION      There were no vitals filed for this visit.   Subjective Assessment - 08/22/20 0939    Subjective "I have some jokes"    Patient is accompained by: Family member   s/o [name]   Currently in Pain? No/denies                 ADULT SLP TREATMENT - 08/22/20 0940      General Information   Behavior/Cognition Alert;Cooperative;Pleasant mood      Treatment Provided   Treatment provided Cognitive-Linquistic      Cognitive-Linquistic Treatment   Treatment focused on Aphasia;Apraxia;Patient/family/caregiver education    Skilled Treatment Dustin Schneider brought in jokes he wrote down and read with cues to go slow, and usual reqeusts for repetition. Writen expression targeted wrtitng 10 words in [employer] relevant categories (parts of sailboat) with occasional min A. 5 words of sailing maneuvers with occasional min and and usual min to mod A to correct errors. In conversation, Dustin Schneider required occasional mod semantic  cues from [place] and [employer] and phonemic  cues from ST to be 60% effective in relaying his messages.      Assessment / Recommendations / Plan   Plan Continue with current plan of care      Progression Toward Goals   Progression toward goals Progressing toward goals              SLP Short Term Goals - 08/22/20 1118      SLP SHORT TERM GOAL #1   Title Pt will name family, friends and art items with occasional min A 8/10 with approximations allowed.    Time 1    Period Weeks    Status Partially Met      SLP SHORT  TERM GOAL #2   Title Pt will name 7 items in personally relevant categories with occasional min A over 2 sessions    Time 1    Period Weeks    Status Partially Met      SLP SHORT TERM GOAL #3   Title Pt will perform 4 automatic speech tasks with rare min A.    Time 1    Period Weeks    Status Partially Met      SLP SHORT TERM GOAL #4   Title Pt will write personal information (name/address/phone), 5 family/friend/pet names and 7  professional words with occasional min A    Time 1    Period Weeks    Status Not Met      SLP SHORT TERM GOAL #5   Title Pt will use mulitmodal communication (gesture, draw, write 1st letter etc) to augment verbal expression to meet needs at home with rare min A from family.    Time 1    Period Weeks    Status Partially Met            SLP Long Term Goals - 08/22/20 1118      SLP LONG TERM GOAL #1   Title Pt will verbalize 3 words to explain or describe his artwork and hobbies with occasional min A over 3 sessions, allowing for intellgible approximations    Baseline 05-17-20 (Lingraphica cues), 05-24-20 (lingraphica), 05-31-20 (lingraphica); 07/25/20 (written word)    Status Achieved      SLP LONG TERM GOAL #2   Title Pt and family will utilize multimodal compensations for aphasia and verbal apraxia to participate in 3 turns each of conversation with occasional min A over 2 sessions    Baseline 07/25/20;    Time 1    Period Weeks    Status Achieved      SLP LONG TERM GOAL #3   Title Pt will write 3 word phrases with less than 2 errors with rare min A over 3 sessions    Status Not Met      SLP LONG TERM GOAL #4   Title Reading assessment if indicated    Status Deferred      SLP LONG TERM GOAL #5   Title Dustin Schneider will use multimodal communication spontaneously  (writing, gesture, device) to augment verbal expression as needed 3/5 times with rare min A    Time 5    Period Weeks    Status On-going      SLP LONG TERM GOAL #6   Title Dustin Schneider will  write (approximate) 4 items in personally relevant category that is understood by family/friends    Baseline 08/22/20;    Time 5    Period Weeks    Status On-going      SLP LONG TERM GOAL #  7   Title Pt will employ multimodal communication to participate in 6 turns in conversation with extended time and occasional min A    Time 6    Period Weeks    Status On-going            Plan - 08/22/20 1117    Clinical Impression Statement Improving moderate Broca's aphasia and severe verbal apraxia persist, affecting communication of basic wants, needs and social interactions.Dustin Schneider is highly motivated and 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 His utterance length in improving and sentences with accurate syntax are emerging with extended time and significant halting. Ongoing training in multimodal communication - . Continue skilled ST to maximize communication for QOL, safety and reduce family burden.    Speech Therapy Frequency 2x / week    Duration --   20 weeks or 40 visits   Treatment/Interventions Environmental controls;Cueing hierarchy;SLP instruction and feedback;Compensatory strategies;Functional tasks;Cognitive reorganization;Compensatory techniques;Internal/external aids;Multimodal communcation approach;Patient/family education;Language facilitation    Potential to Achieve Goals Good    Potential Considerations Severity of impairments           Patient will benefit from skilled therapeutic intervention in order to improve the following deficits and impairments:   Aphasia  Verbal apraxia    Problem List Patient Active Problem List   Diagnosis Date Noted  . Benign essential HTN 06/09/2020  . Abnormality of gait 04/28/2020  . Neurogenic bladder 03/17/2020  . Sepsis due to pneumonia ([place]) 03/06/2020  . History of stroke with residual deficit 03/06/2020  . Transient hypotension 03/06/2020  . Spastic hemiplegia affecting nondominant  side ([place])   . History of hypertension   . Expressive aphasia   . AKI (acute kidney injury) ([place])   . Global aphasia   . Hypoalbuminemia due to protein-calorie malnutrition ([place])   . Dysphagia, post-stroke   . Hyperlipidemia 02/02/2020  . Dysphagia following cerebral infarction 02/02/2020  . Aspiration pneumonia ([place]) 02/02/2020  . Acute ischemic left MCA stroke ([place]) 02/02/2020  . Acute respiratory failure ([place])   . Acute ischemic stroke [facility]) s/p clot retrieval L MCA & ACA A3 01/27/2020  . Aspiration into airway 01/27/2020  . Chronic anticoagulation 01/27/2020  . Paroxysmal atrial fibrillation ([place]) 06/15/2018  . Essential hypertension 06/15/2018  . NICM (nonischemic cardiomyopathy) ([place]) 06/15/2018  . Visit for monitoring Tikosyn therapy 06/12/2018    Joice Nazario, Annye Rusk MS, CCC-SLP 08/22/2020, 11:21 AM  [facility] [address] [place], [place], [address] Phone: [phone]   Fax:  [phone]   Name: Dustin Schneider MRN: [id number] Date of Birth: April 21, 1956

## 2020-08-22 NOTE — Therapy (Signed)
[facility] [address] [place], [place], [address] Phone: [phone]   Fax:  [phone]  Physical Therapy Treatment  Patient Details  Name: Dustin Schneider MRN: [id number] Date of Birth: 04/23/1956 Referring Provider (PT): Lauraine Rinne, PA-C (followed by Dr. Delice Lesch)   Encounter Date: 08/22/2020   PT End of Session - 08/22/20 1440    Visit Number 16    Number of Visits 48    Date for PT Re-Evaluation 09/01/20    Authorization Type BCBS    PT Start Time [phone]    PT Stop Time 0932    PT Time Calculation (min) 43 min    Equipment Utilized During Treatment Gait belt    Activity Tolerance Patient tolerated treatment well    Behavior During Therapy [facility] for tasks assessed/performed           Past Medical History:  Diagnosis Date  . Hypertension   . NICM (nonischemic cardiomyopathy) ([place]) 06/15/2018   04/23/18: EF 45%, 09/15/18: NOrmal LVEF  . Paroxysmal atrial fibrillation ([place]) 06/15/2018  . Stroke ([place])   . Visit for monitoring Tikosyn therapy 06/12/2018    Past Surgical History:  Procedure Laterality Date  . BUBBLE STUDY  03/11/2020   Procedure: BUBBLE STUDY;  Surgeon: [name], MD;  Location: [facility];  Service: Cardiovascular;;  . CARDIOVERSION N/A 05/13/2018   Procedure: CARDIOVERSION;  Surgeon: [name], MD;  Location: [facility] ENDOSCOPY;  Service: Cardiovascular;  Laterality: N/A;  . CARDIOVERSION N/A 06/13/2018   Procedure: CARDIOVERSION;  Surgeon: [name], MD;  Location: [facility] ENDOSCOPY;  Service: Cardiovascular;  Laterality: N/A;  . CARDIOVERSION N/A 06/23/2018   Procedure: CARDIOVERSION;  Surgeon: [name], MD;  Location: [facility] ENDOSCOPY;  Service: Cardiovascular;  Laterality: N/A;  . CARDIOVERSION N/A 03/11/2020   Procedure: CARDIOVERSION;  Surgeon: [name], MD;  Location: [facility];  Service: Cardiovascular;  Laterality: N/A;  . excision of actinic keratosis    . EYE SURGERY     eye  lift  . IR CT HEAD LTD  01/27/2020  . IR PERCUTANEOUS ART THROMBECTOMY/INFUSION INTRACRANIAL INC DIAG ANGIO  01/27/2020      . IR PERCUTANEOUS ART THROMBECTOMY/INFUSION INTRACRANIAL INC DIAG ANGIO  01/27/2020  . RADIOLOGY WITH ANESTHESIA N/A 01/27/2020   Procedure: IR WITH ANESTHESIA;  Surgeon: Radiologist, Medication, MD;  Location: [facility];  Service: Radiology;  Laterality: N/A;  . TEE WITHOUT CARDIOVERSION N/A 03/11/2020   Procedure: TRANSESOPHAGEAL ECHOCARDIOGRAM (TEE);  Surgeon: [name], MD;  Location: [place] [facility];  Service: Cardiovascular;  Laterality: N/A;  . TONSILLECTOMY    . WISDOM TOOTH EXTRACTION      There were no vitals filed for this visit.   Subjective Assessment - 08/22/20 1558    Subjective Brought in a sheet full of handwritten jokes. Walked outside at [place] over the weekend with [employer].    Patient is accompained by: --   caregiver Jody   Pertinent History PMH: HTN, paroxysmal a fib    Limitations Standing;Walking    Patient Stated Goals "wants everything working on R side again"    Currently in Pain? No/denies    Pain Onset 1 to 4 weeks ago                             [facility] Adult PT Treatment/Exercise - 08/22/20 0930      Transfers   Transfers Sit to Stand;Stand to Sit    Sit  to Stand 5: Supervision    Stand to Sit 5: Supervision      Ambulation/Gait   Ambulation/Gait Yes    Ambulation/Gait Assistance 5: Supervision;4: Min guard    Ambulation/Gait Assistance Details performed gait outdoors over paved surfaces with pt needing closer min guard for a couple times for unlevel bumps in the pavement. intermittent assist from therapist throughout session today for anterior pelvic rotation on R to decr R hip external rotation and for improved foot clearance    Ambulation Distance (Feet) 600 Feet   outdoors, plus 2 x 115' indoors   Assistive device Rolling walker   R hand orthosis, R AFO with toe cap   Gait Pattern Step-through  pattern;Decreased hip/knee flexion - right;Decreased stance time - right    Ambulation Surface Level;Indoor;Unlevel;Outdoor;Paved    Curb Other (comment)   CGA/min guard   Curb Details (indicate cue type and reason) x3 reps on curb outdoors using RW, pt able to demo understanding of technique, reviewed technique and rationale with gf [name]    Gait Comments provided pt with new felt wrist strap for R hand orthosis due to pt's old one not being able to stick to velco and provided pt with new tennis balls as old ones were already run down      Neuro Re-ed    Neuro Re-ed Details  in // bars: on blue foam beam - keeping RLE as stance leg and using LUE support for balance 2 x 10 reps posterior stepping with LLE and x10 reps with anterior stepping of LLE - cues for quad activation and keeping RLE straight, with feet a little closer than hip width: 2 x 10 reps head turns, x10 reps head nods - tactile and verbal cues for R knee extension               Balance Exercises - 08/22/20 0001      Balance Exercises: Standing   Retro Gait 4 reps;Limitations    Retro Gait Limitations in // bars with LUE support cues for glute activation when stepping back with R and posture    Other Standing Exercises forward walking in // bars x2 reps no UE support (one instance of foot almost catching and min A for balance) and x2 reps with single LUE support, cues for quad/glute activation in RLE during stance time for improved balance/SLS and incr step length with LLE             PT Education - 08/22/20 1439    Education Details verbally reviewed R hip IR stretch - pt reports he has been performing at home, discussed with pt and gf [name] - will start to check LTGs in next couple of appts and anticipate re-cert for an additional 4 weeks to continue to work on gait and stair training and balance, both in agreement with plan (pt currently scheduled out until end of jan, would have to add appts for an additional 2 weeks in  feb)    Person(s) Educated Patient   gf [name]   Methods Explanation    Comprehension Verbalized understanding            PT Short Term Goals - 07/28/20 0855      PT SHORT TERM GOAL #1   Title Pt will be mod I with all transfers with use of right AFO once he receives it for improved mobility.    Baseline Mod-I with all transfers 07/01/20    Time 4    Period Weeks  Status Achieved    Target Date 07/03/20      PT SHORT TERM GOAL #2   Title Pt will be able to ambulate > 345' with RW with right AFO and hand grip attachment min assist for improved mobility.    Baseline 345' with RW w/ hand grip and R AFO with min A    Time 4    Period Weeks    Status Achieved    Target Date 07/03/20      PT SHORT TERM GOAL #3   Title Pt will decrease 5 x sit to stand from 17.98 sec to <15 sec for improved balance and functional strength.    Baseline 06/03/20 17.98 sec from mat with hand; 16.4 s with hands from mat 07/01/20; 12.75 seconds with LUE support from mat, supervision, 13.6 seconds no UE support on 07/28/20    Time 4    Period Weeks    Status Achieved    Target Date 07/31/20      PT SHORT TERM GOAL #4   Title Pt will be instructed in proper AFO wear when new brace received.    Baseline met    Time 4    Period Weeks    Status Achieved    Target Date 07/31/20             PT Long Term Goals - 08/22/20 1441      PT LONG TERM GOAL #1   Title Pt and pt's family members will be independent with final HEP in order to build upon functional gains made in therapy. ALL LTGS DUE 09/01/20    Baseline Pt has been performing current HEP with family but PT continues to add to it 06/03/20    Time 12    Period Weeks    Status On-going      PT LONG TERM GOAL #2   Title Pt will ambulate >500' on varied surfaces supervision with RW and R AFO for improved household mobility and short community distances.    Baseline 230' with RW with right hand grip attachment and R AFO min assist on 06/03/20     Time 12    Period Weeks    Status Revised      PT LONG TERM GOAL #3   Title Pt will decrease TUG from 27 sec to <20 sec for improved balance and decreased fall risk.    Baseline 06/03/20 TUG=27 sec with RW    Time 12    Period Weeks    Status New      PT LONG TERM GOAL #4   Title Pt will be able to ambulate up/down curb with RW and R AFO CGA for improved community access.    Baseline min guard with RW and R AFO on 08/22/20, x3 reps on outdoor curb    Time 12    Period Weeks    Status Achieved                 Plan - 08/22/20 1555    Clinical Impression Statement Performed gait outdoors with RW with pt needing min guard esp when coming to more unlevel cracks in the pavement. Pt able to perform a curb outdoors x3 reps with RW with min guard (meeting LTG #4) and pt able to demo understanding of proper sequencing. Remainder of session focused on balance strategies on compliant surfaces and forward/retro gait in // bars with focus on RLE stance time and LUE support. Will continue to progress towards LTGs.  Personal Factors and Comorbidities Comorbidity 3+    Comorbidities dense L MCA CVA, paroxysmal A. fib on chronic Xarelto, nonischemic cardiomyopathy and hypertension.    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, works as an Electrical engineer Evolving/Moderate complexity    Rehab Potential Good    PT Frequency 2x / week    PT Duration 12 weeks    PT Treatment/Interventions ADLs/Self Care Home Management;Aquatic Therapy;DME Instruction;Gait training;Stair training;Functional mobility training;Therapeutic activities;Therapeutic exercise;Electrical Stimulation;Balance training;Neuromuscular re-education;Wheelchair mobility training;Orthotic Fit/Training;Patient/family  education;Passive range of motion;Energy conservation    PT Next Visit Plan begin to check LTGs, only a couple visits towards re-cert. forward gait in bars with single UE support. stair training, obstacle negotiation, gait outdoors and over unlevel surfaces, curb training with RW. RLE NMR with decr UE support, balance on compliant surfaces.    PT Home Exercise Plan on 06/28/20 - added standing at [place] and stepping LLE out and in    Consulted and Agree with Plan of Care Patient;Family member/caregiver    Family Member Consulted gf, [name]           Patient will benefit from skilled therapeutic intervention in order to improve the following deficits and impairments:  Abnormal gait,Decreased activity tolerance,Decreased balance,Decreased cognition,Decreased mobility,Decreased coordination,Decreased range of motion,Decreased endurance,Decreased strength,Hypomobility,Difficulty walking,Impaired UE functional use,Impaired vision/preception,Postural dysfunction,Impaired sensation  Visit Diagnosis: Other abnormalities of gait and mobility  Muscle weakness (generalized)  Other lack of coordination  Unsteadiness on feet  Hemiplegia and hemiparesis following cerebral infarction affecting right dominant side [facility])     Problem List Patient Active Problem List   Diagnosis Date Noted  . Benign essential HTN 06/09/2020  . Abnormality of gait 04/28/2020  . Neurogenic bladder 03/17/2020  . Sepsis due to pneumonia ([place]) 03/06/2020  . History of stroke with residual deficit 03/06/2020  . Transient hypotension 03/06/2020  . Spastic hemiplegia affecting nondominant side ([place])   . History of hypertension   . Expressive aphasia   . AKI (acute kidney injury) ([place])   . Global aphasia   . Hypoalbuminemia due to protein-calorie malnutrition ([place])   . Dysphagia, post-stroke   . Hyperlipidemia 02/02/2020  . Dysphagia following cerebral infarction 02/02/2020  . Aspiration pneumonia ([place]) 02/02/2020   . Acute ischemic left MCA stroke ([place]) 02/02/2020  . Acute respiratory failure ([place])   . Acute ischemic stroke [facility]) s/p clot retrieval L MCA & ACA A3 01/27/2020  . Aspiration into airway 01/27/2020  . Chronic anticoagulation 01/27/2020  . Paroxysmal atrial fibrillation ([place]) 06/15/2018  . Essential hypertension 06/15/2018  . NICM (nonischemic cardiomyopathy) ([place]) 06/15/2018  . Visit for monitoring Tikosyn therapy 06/12/2018    Arliss Journey, PT, DPT  08/22/2020, 3:58 PM  [facility] [address] [place], [place], [address] Phone: [phone]   Fax:  [phone]  Name: Dustin Schneider MRN: [id number] Date of Birth: April 03, 1956

## 2020-08-26 ENCOUNTER — Other Ambulatory Visit: Payer: Self-pay

## 2020-08-26 ENCOUNTER — Encounter: Payer: Self-pay | Admitting: Speech Pathology

## 2020-08-26 ENCOUNTER — Ambulatory Visit: Payer: BC Managed Care – PPO

## 2020-08-26 ENCOUNTER — Ambulatory Visit: Payer: BC Managed Care – PPO | Admitting: Speech Pathology

## 2020-08-26 DIAGNOSIS — R2681 Unsteadiness on feet: Secondary | ICD-10-CM

## 2020-08-26 DIAGNOSIS — R2689 Other abnormalities of gait and mobility: Secondary | ICD-10-CM

## 2020-08-26 DIAGNOSIS — R482 Apraxia: Secondary | ICD-10-CM

## 2020-08-26 DIAGNOSIS — I69351 Hemiplegia and hemiparesis following cerebral infarction affecting right dominant side: Secondary | ICD-10-CM | POA: Diagnosis not present

## 2020-08-26 DIAGNOSIS — R4701 Aphasia: Secondary | ICD-10-CM

## 2020-08-26 DIAGNOSIS — M6281 Muscle weakness (generalized): Secondary | ICD-10-CM

## 2020-08-26 NOTE — Therapy (Signed)
[facility] [address] [place], [place], [address] Phone: [phone]   Fax:  [phone]  Physical Therapy Treatment/Recert  Patient Details  Name: Dustin Schneider MRN: [id number] Date of Birth: 1956/06/29 Referring Provider (PT): Lauraine Rinne, PA-C but followed by Delice Lesch   Encounter Date: 08/26/2020   PT End of Session - 08/26/20 0845    Visit Number [id number]    Number of Visits 63    Date for PT Re-Evaluation 10/25/20    Authorization Type BCBS    PT Start Time 0845    PT Stop Time 0930    PT Time Calculation (min) 45 min    Equipment Utilized During Treatment Gait belt    Activity Tolerance Patient tolerated treatment well    Behavior During Therapy [facility] for tasks assessed/performed           Past Medical History:  Diagnosis Date  . Hypertension   . NICM (nonischemic cardiomyopathy) ([place]) 06/15/2018   04/23/18: EF 45%, 09/15/18: NOrmal LVEF  . Paroxysmal atrial fibrillation ([place]) 06/15/2018  . Stroke ([place])   . Visit for monitoring Tikosyn therapy 06/12/2018    Past Surgical History:  Procedure Laterality Date  . BUBBLE STUDY  03/11/2020   Procedure: BUBBLE STUDY;  Surgeon: [name], MD;  Location: [facility];  Service: Cardiovascular;;  . CARDIOVERSION N/A 05/13/2018   Procedure: CARDIOVERSION;  Surgeon: [name], MD;  Location: [facility] ENDOSCOPY;  Service: Cardiovascular;  Laterality: N/A;  . CARDIOVERSION N/A 06/13/2018   Procedure: CARDIOVERSION;  Surgeon: [name], MD;  Location: [facility] ENDOSCOPY;  Service: Cardiovascular;  Laterality: N/A;  . CARDIOVERSION N/A 06/23/2018   Procedure: CARDIOVERSION;  Surgeon: [name], MD;  Location: [facility] ENDOSCOPY;  Service: Cardiovascular;  Laterality: N/A;  . CARDIOVERSION N/A 03/11/2020   Procedure: CARDIOVERSION;  Surgeon: [name], MD;  Location: [facility];  Service: Cardiovascular;  Laterality: N/A;  . excision of actinic keratosis    . EYE SURGERY      eye lift  . IR CT HEAD LTD  01/27/2020  . IR PERCUTANEOUS ART THROMBECTOMY/INFUSION INTRACRANIAL INC DIAG ANGIO  01/27/2020      . IR PERCUTANEOUS ART THROMBECTOMY/INFUSION INTRACRANIAL INC DIAG ANGIO  01/27/2020  . RADIOLOGY WITH ANESTHESIA N/A 01/27/2020   Procedure: IR WITH ANESTHESIA;  Surgeon: Radiologist, Medication, MD;  Location: [facility];  Service: Radiology;  Laterality: N/A;  . TEE WITHOUT CARDIOVERSION N/A 03/11/2020   Procedure: TRANSESOPHAGEAL ECHOCARDIOGRAM (TEE);  Surgeon: [name], MD;  Location: [facility];  Service: Cardiovascular;  Laterality: N/A;  . TONSILLECTOMY    . WISDOM TOOTH EXTRACTION      There were no vitals filed for this visit.   Subjective Assessment - 08/26/20 0846    Subjective Pt denies any new issues. Has been walking a lot at home. Tennis balls are worn through already.    Patient is accompained by: --    Pertinent History PMH: HTN, paroxysmal a fib    Limitations Standing;Walking    Patient Stated Goals "wants everything working on R side again"    Currently in Pain? No/denies    Pain Onset 1 to 4 weeks ago              [facility] PT Assessment - 08/26/20 0847      Assessment   Medical Diagnosis L MCA CVA    Referring Provider (PT) Lauraine Rinne, PA-C but followed by Delice Lesch    Onset Date/Surgical Date 01/27/20  [facility] PT Treatment/Exercise - 08/26/20 0847      Transfers   Transfers Sit to Stand;Stand to Sit    Sit to Stand 5: Supervision    Stand to Sit 5: Supervision      Ambulation/Gait   Ambulation/Gait Yes    Ambulation/Gait Assistance 5: Supervision;4: Min guard    Ambulation/Gait Assistance Details around in clinic during session    Assistive device Rolling walker   right hand grip attachment, right AFO   Gait Pattern Step-through pattern;Decreased stance time - right;Decreased hip/knee flexion - right    Ambulation Surface Level;Indoor    Gait velocity 16.9 sec=0.41ms     Gait Comments Replaced tennis balls on walker      Standardized Balance Assessment   Standardized Balance Assessment Berg Balance Test;Timed Up and Go Test      Berg Balance Test   Sit to Stand Able to stand  independently using hands    Standing Unsupported Able to stand safely 2 minutes    Sitting with Back Unsupported but Feet Supported on Floor or Stool Able to sit safely and securely 2 minutes    Stand to Sit Sits safely with minimal use of hands    Transfers Able to transfer with verbal cueing and /or supervision    Standing Unsupported with Eyes Closed Able to stand 10 seconds safely    Standing Ubsupported with Feet Together Able to place feet together independently and stand for 1 minute with supervision    From Standing, Reach Forward with Outstretched Arm Can reach forward >12 cm safely (5")    From Standing Position, Pick up Object from Floor Able to pick up shoe safely and easily    From Standing Position, Turn to Look Behind Over each Shoulder Looks behind one side only/other side shows less weight shift    Turn 360 Degrees Needs assistance while turning   needs min assist turning to the left   Standing Unsupported, Alternately Place Feet on Step/Stool Able to complete >2 steps/needs minimal assist    Standing Unsupported, One Foot in Front Able to take small step independently and hold 30 seconds    Standing on One Leg Tries to lift leg/unable to hold 3 seconds but remains standing independently    Total Score 38      Timed Up and Go Test   TUG Normal TUG    Normal TUG (seconds) 21   23 sec and 19 sec     Exercises   Exercises Other Exercises    Other Exercises  PT updated HEP and went through those noted below in medbridge. Pt had not been working on some and some were older. Instructed to perform all exercises along with walker to maximize benefit.           Supine Bridge - 1 x daily - 5 x weekly - 2 sets - 10 reps  -discussed resuming and importance for hip  extension Supine Hip Internal and External Rotation - 2 x daily - 7 x weekly - 2 sets - 10 reps -discussed continuing Clamshell - 1 x daily - 7 x weekly - 3 sets - 10 reps -performed x 10 reps on RLE with PT providing tactile and verbal cues for form. Instructed pt and caregiver on how to correctly perform at home. Seated Hip Adduction Isometrics with Ball - 3 x daily - 7 x weekly - 2 sets - 10 reps - 5 hold -discussed continuing Side to Side Weight Shift with Counter  Support - 2 x daily - 7 x weekly - 2 sets - 10 reps -discussed continuing Mini Squat with Counter Support - 2 x daily - 7 x weekly - 2 sets - 10 reps -discussed continuing.         PT Education - 08/26/20 0943    Education Details PT discussed results of testing and meaning of TUG, Berg and gait speed. Discussed recert plan through February at this time. Updated HEP and printed new one out    Person(s) Educated Patient;Caregiver(s)    Methods Explanation;Demonstration;Handout    Comprehension Verbalized understanding            PT Short Term Goals - 07/28/20 0855      PT SHORT TERM GOAL #1   Title Pt will be mod I with all transfers with use of right AFO once he receives it for improved mobility.    Baseline Mod-I with all transfers 07/01/20    Time 4    Period Weeks    Status Achieved    Target Date 07/03/20      PT SHORT TERM GOAL #2   Title Pt will be able to ambulate > 345' with RW with right AFO and hand grip attachment min assist for improved mobility.    Baseline 345' with RW w/ hand grip and R AFO with min A    Time 4    Period Weeks    Status Achieved    Target Date 07/03/20      PT SHORT TERM GOAL #3   Title Pt will decrease 5 x sit to stand from 17.98 sec to <15 sec for improved balance and functional strength.    Baseline 06/03/20 17.98 sec from mat with hand; 16.4 s with hands from mat 07/01/20; 12.75 seconds with LUE support from mat, supervision, 13.6 seconds no UE support on 07/28/20     Time 4    Period Weeks    Status Achieved    Target Date 07/31/20      PT SHORT TERM GOAL #4   Title Pt will be instructed in proper AFO wear when new brace received.    Baseline met    Time 4    Period Weeks    Status Achieved    Target Date 07/31/20             PT Long Term Goals - 08/26/20 0722      PT LONG TERM GOAL #1   Title Pt and pt's family members will be independent with final HEP in order to build upon functional gains made in therapy. ALL LTGS DUE 09/01/20    Baseline PT continues to update HEP as pt is progressing. Most recent update 08/26/20    Time 12    Period Weeks    Status On-going      PT LONG TERM GOAL #2   Title Pt will ambulate >500' on varied surfaces supervision with RW and R AFO for improved household mobility and short community distances.    Baseline 230' with RW with right hand grip attachment and R AFO min assist on 06/03/20. 08/26/20 Pt is supervision on level surfaces and CGA on nonlevel up to 600' with RW    Time 12    Period Weeks    Status Partially Met      PT LONG TERM GOAL #3   Title Pt will decrease TUG from 27 sec to <20 sec for improved balance and decreased fall risk.  Baseline 06/03/20 TUG=27 sec with RW, TUG=21 sec with RW    Time 12    Period Weeks    Status Not Met      PT LONG TERM GOAL #4   Title Pt will be able to ambulate up/down curb with RW and R AFO CGA for improved community access.    Baseline min guard with RW and R AFO on 08/22/20, x3 reps on outdoor curb,    Time 12    Period Weeks    Status Achieved           Updated goals:  PT Short Term Goals - 08/26/20 0958      PT SHORT TERM GOAL #1   Title Pt will decrease TUG from 21 sec to <18 sec for improved balance and functional mobility.    Baseline 08/26/20 21 sec with RW    Time 4    Period Weeks    Status New    Target Date 09/25/20      PT SHORT TERM GOAL #2   Title Pt will ambulate >800' on varied surfaces with RW/right hand grip attachment and  AFO supervision for improved short community distances.    Baseline 600' CGA with walker    Time 4    Period Weeks    Status New    Target Date 09/25/20           PT Long Term Goals - 08/26/20 1000      PT LONG TERM GOAL #1   Title Pt and pt's family members will be independent with final HEP in order to build upon functional gains made in therapy. ALL LTGS DUE3/15/22    Baseline PT continues to update HEP as pt is progressing. Most recent update 08/26/20    Time 8    Period Weeks    Status On-going    Target Date 10/25/20      PT LONG TERM GOAL #2   Title Pt will ambulate 250' with cane and right AFO CGA for improved household mobility on level surfaces.    Time 8    Period Weeks    Status New    Target Date 10/25/20      PT LONG TERM GOAL #3   Title Pt will increase Berg from 38 to >42/56 for improved balance and decreased fall risk.    Baseline 08/26/20 38/56    Time 8    Period Weeks    Status New    Target Date 10/25/20      PT LONG TERM GOAL #4   Title Pt will ambulate up/down 2 steps with LRAD supervision for improved access in home to living room    Baseline CGA with RW    Time 8    Period Weeks    Status New    Target Date 10/25/20      PT LONG TERM GOAL #5   Title Pt will increase gait speed from 0.10ms to >0.76m for improved community mobility.    Baseline 08/26/20 0.5968m   Time 8    Period Weeks    Status New    Target Date 10/25/20                Plan - 08/26/20 0949    Clinical Impression Statement PT reassessed LTGs today with pt continuing to show progress on all. He has increased gait distance and is now at supervision level on level surfaces and CGA on nonlevel surfaces. [name] gait speed  was assessed and was 0.48ms indicating safe speed for household ambulator but unsafe for community. He decreased TUG time by 6 sec just short of goal with score of 21 sec. Berg Balance was assessed for first time with score of 38/56. Pt is high fall risk  based on these 2 balance measures. Pt will continue to benefit from skilled PT to continue to address strength, balance and functional mobility deficits to maximize independence.    Personal Factors and Comorbidities Comorbidity 3+    Comorbidities dense L MCA CVA, paroxysmal A. fib on chronic Xarelto, nonischemic cardiomyopathy and hypertension.    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, works as an aElectrical engineerEvolving/Moderate complexity    Rehab Potential Good    PT Frequency 2x / week    PT Duration 8 weeks    PT Treatment/Interventions ADLs/Self Care Home Management;Aquatic Therapy;DME Instruction;Gait training;Stair training;Functional mobility training;Therapeutic activities;Therapeutic exercise;Electrical Stimulation;Balance training;Neuromuscular re-education;Wheelchair mobility training;Orthotic Fit/Training;Patient/family education;Passive range of motion;Energy conservation    PT Next Visit Plan forward gait in bars with single UE support. stair training, obstacle negotiation, gait outdoors and over unlevel surfaces, curb training with RW. RLE NMR with decr UE support, balance on compliant surfaces.    PT Home Exercise Plan on 06/28/20 - added standing at countetop and stepping LLE out and in    Consulted and Agree with Plan of Care Patient;Family member/caregiver    Family Member Consulted [name]           Patient will benefit from skilled therapeutic intervention in order to improve the following deficits and impairments:  Abnormal gait,Decreased activity tolerance,Decreased balance,Decreased cognition,Decreased mobility,Decreased coordination,Decreased range of motion,Decreased endurance,Decreased strength,Hypomobility,Difficulty walking,Impaired UE functional  use,Impaired vision/preception,Postural dysfunction,Impaired sensation  Visit Diagnosis: Other abnormalities of gait and mobility  Muscle weakness (generalized)  Unsteadiness on feet  Hemiplegia and hemiparesis following cerebral infarction affecting right dominant side ([facility]     Problem List Patient Active Problem List   Diagnosis Date Noted  . Benign essential HTN 06/09/2020  . Abnormality of gait 04/28/2020  . Neurogenic bladder 03/17/2020  . Sepsis due to pneumonia ([place] 03/06/2020  . History of stroke with residual deficit 03/06/2020  . Transient hypotension 03/06/2020  . Spastic hemiplegia affecting nondominant side ([place]   . History of hypertension   . Expressive aphasia   . AKI (acute kidney injury) ([place]   . Global aphasia   . Hypoalbuminemia due to protein-calorie malnutrition ([place]   . Dysphagia, post-stroke   . Hyperlipidemia 02/02/2020  . Dysphagia following cerebral infarction 02/02/2020  . Aspiration pneumonia ([place] 02/02/2020  . Acute ischemic left MCA stroke ([place] 02/02/2020  . Acute respiratory failure ([place]   . Acute ischemic stroke ([facility] s/p clot retrieval L MCA & ACA A3 01/27/2020  . Aspiration into airway 01/27/2020  . Chronic anticoagulation 01/27/2020  . Paroxysmal atrial fibrillation ([place] 06/15/2018  . Essential hypertension 06/15/2018  . NICM (nonischemic cardiomyopathy) ([place] 06/15/2018  . Visit for monitoring Tikosyn therapy 06/12/2018    EElecta Sniff PT, DPT, NCS 08/26/2020, 9:55 AM  [facility] ([place])[address] [address]Phone: [phone]  Fax:  [phone] Name: Dustin LEABOMRN: [id number] of Birth: [birth year]

## 2020-08-26 NOTE — Therapy (Signed)
[facility] [address] [place], [place], [address] Phone: [phone]   Fax:  [phone]  Speech Language Pathology Treatment  Patient Details  Name: Dustin Schneider MRN: [id number] Date of Birth: 29-Jul-1956 Referring Provider (SLP): Dr. Delice Lesch   Encounter Date: 08/26/2020   End of Session - 08/26/20 1026    Visit Number 20    Number of Visits 40    Date for SLP Re-Evaluation 09/19/20    SLP Start Time 0932    SLP Stop Time  5465    SLP Time Calculation (min) 46 min    Activity Tolerance Patient tolerated treatment well           Past Medical History:  Diagnosis Date  . Hypertension   . NICM (nonischemic cardiomyopathy) ([place]) 06/15/2018   04/23/18: EF 45%, 09/15/18: NOrmal LVEF  . Paroxysmal atrial fibrillation ([place]) 06/15/2018  . Stroke ([place])   . Visit for monitoring Tikosyn therapy 06/12/2018    Past Surgical History:  Procedure Laterality Date  . BUBBLE STUDY  03/11/2020   Procedure: BUBBLE STUDY;  Surgeon: [name], MD;  Location: [facility];  Service: Cardiovascular;;  . CARDIOVERSION N/A 05/13/2018   Procedure: CARDIOVERSION;  Surgeon: [name], MD;  Location: [facility] ENDOSCOPY;  Service: Cardiovascular;  Laterality: N/A;  . CARDIOVERSION N/A 06/13/2018   Procedure: CARDIOVERSION;  Surgeon: [name], MD;  Location: [facility] ENDOSCOPY;  Service: Cardiovascular;  Laterality: N/A;  . CARDIOVERSION N/A 06/23/2018   Procedure: CARDIOVERSION;  Surgeon: [name], MD;  Location: [facility] ENDOSCOPY;  Service: Cardiovascular;  Laterality: N/A;  . CARDIOVERSION N/A 03/11/2020   Procedure: CARDIOVERSION;  Surgeon: [name], MD;  Location: [facility];  Service: Cardiovascular;  Laterality: N/A;  . excision of actinic keratosis    . EYE SURGERY     eye lift  . IR CT HEAD LTD  01/27/2020  . IR PERCUTANEOUS ART THROMBECTOMY/INFUSION INTRACRANIAL INC DIAG ANGIO  01/27/2020      . IR PERCUTANEOUS ART  THROMBECTOMY/INFUSION INTRACRANIAL INC DIAG ANGIO  01/27/2020  . RADIOLOGY WITH ANESTHESIA N/A 01/27/2020   Procedure: IR WITH ANESTHESIA;  Surgeon: Radiologist, Medication, MD;  Location: [facility];  Service: Radiology;  Laterality: N/A;  . TEE WITHOUT CARDIOVERSION N/A 03/11/2020   Procedure: TRANSESOPHAGEAL ECHOCARDIOGRAM (TEE);  Surgeon: [name], MD;  Location: [facility];  Service: Cardiovascular;  Laterality: N/A;  . TONSILLECTOMY    . WISDOM TOOTH EXTRACTION      There were no vitals filed for this visit.   Subjective Assessment - 08/26/20 0941    Subjective "I'm ok"    Patient is accompained by: Family member   [name]   Currently in Pain? No/denies                 ADULT SLP TREATMENT - 08/26/20 0942      General Information   Behavior/Cognition Alert;Cooperative;Pleasant mood      Treatment Provided   Treatment provided Cognitive-Linquistic      Pain Assessment   Pain Assessment No/denies pain      Cognitive-Linquistic Treatment   Treatment focused on Aphasia;Apraxia;Patient/family/caregiver education    Skilled Treatment Written expression targeted at word level in personally relevant categories. Jim generated  15 garending/flowers with frequent mod to max A to correct errors, occasional min to mod A to ID errors. In conversation, Dustin Schneider totld story re: snow with extended time, gestures and verbal approximations and min questioning cues to successfully complete story  Assessment / Recommendations / Plan   Plan Continue with current plan of care      Progression Toward Goals   Progression toward goals Progressing toward goals            SLP Education - 08/26/20 1015    Education Details compensations for aphasia, multimodal communication+-    Methods Explanation;Demonstration;Verbal cues    Comprehension Returned demonstration;Verbal cues required            SLP Short Term Goals - 08/26/20 1025      SLP SHORT TERM GOAL #1   Title Pt will name  family, friends and art items with occasional min A 8/10 with approximations allowed.    Time 1    Period Weeks    Status Partially Met      SLP SHORT TERM GOAL #2   Title Pt will name 7 items in personally relevant categories with occasional min A over 2 sessions    Time 1    Period Weeks    Status Partially Met      SLP SHORT TERM GOAL #3   Title Pt will perform 4 automatic speech tasks with rare min A.    Time 1    Period Weeks    Status Partially Met      SLP SHORT TERM GOAL #4   Title Pt will write personal information (name/address/phone), 5 family/friend/pet names and 7  professional words with occasional min A    Time 1    Period Weeks    Status Not Met      SLP SHORT TERM GOAL #5   Title Pt will use mulitmodal communication (gesture, draw, write 1st letter etc) to augment verbal expression to meet needs at home with rare min A from family.    Time 1    Period Weeks    Status Partially Met            SLP Long Term Goals - 08/22/20 1118      SLP LONG TERM GOAL #1   Title Pt will verbalize 3 words to explain or describe his artwork and hobbies with occasional min A over 3 sessions, allowing for intellgible approximations    Baseline 05-17-20 (Lingraphica cues), 05-24-20 (lingraphica), 05-31-20 (lingraphica); 07/25/20 (written word)    Status Achieved      SLP LONG TERM GOAL #2   Title Pt and family will utilize multimodal compensations for aphasia and verbal apraxia to participate in 3 turns each of conversation with occasional min A over 2 sessions    Baseline 07/25/20;    Time 1    Period Weeks    Status Achieved      SLP LONG TERM GOAL #3   Title Pt will write 3 word phrases with less than 2 errors with rare min A over 3 sessions    Status Not Met      SLP LONG TERM GOAL #4   Title Reading assessment if indicated    Status Deferred      SLP LONG TERM GOAL #5   Title Dustin Schneider will use multimodal communication spontaneously  (writing, gesture, device) to  augment verbal expression as needed 3/5 times with rare min A    Time 5    Period Weeks    Status On-going      SLP LONG TERM GOAL #6   Title Dustin Schneider will write (approximate) 4 items in personally relevant category that is understood by family/friends    Baseline 08/22/20;  Time 5    Period Weeks    Status On-going      SLP LONG TERM GOAL #7   Title Pt will employ multimodal communication to participate in 6 turns in conversation with extended time and occasional min A    Time 6    Period Weeks    Status On-going            Plan - 08/26/20 1024    Clinical Impression Statement Improving moderate Broca's aphasia and severe verbal apraxia persist, affecting communication of basic wants, needs and social interactions.Dustin Schneider is highly motivated and 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 His utterance length in improving and sentences with accurate syntax are emerging with extended time and significant halting. Ongoing training in multimodal communication - . Continue skilled ST to maximize communication for QOL, safety and reduce family burden.    Speech Therapy Frequency 2x / week    Duration --   20 weeks or 40 visits   Treatment/Interventions Environmental controls;Cueing hierarchy;SLP instruction and feedback;Compensatory strategies;Functional tasks;Cognitive reorganization;Compensatory techniques;Internal/external aids;Multimodal communcation approach;Patient/family education;Language facilitation    Potential to Achieve Goals Good    Potential Considerations Severity of impairments           Patient will benefit from skilled therapeutic intervention in order to improve the following deficits and impairments:   Aphasia  Verbal apraxia    Problem List Patient Active Problem List   Diagnosis Date Noted  . Benign essential HTN 06/09/2020  . Abnormality of gait 04/28/2020  . Neurogenic bladder 03/17/2020  . Sepsis due to  pneumonia ([place]) 03/06/2020  . History of stroke with residual deficit 03/06/2020  . Transient hypotension 03/06/2020  . Spastic hemiplegia affecting nondominant side ([place])   . History of hypertension   . Expressive aphasia   . AKI (acute kidney injury) ([place])   . Global aphasia   . Hypoalbuminemia due to protein-calorie malnutrition ([place])   . Dysphagia, post-stroke   . Hyperlipidemia 02/02/2020  . Dysphagia following cerebral infarction 02/02/2020  . Aspiration pneumonia ([place]) 02/02/2020  . Acute ischemic left MCA stroke ([place]) 02/02/2020  . Acute respiratory failure ([place])   . Acute ischemic stroke [facility]) s/p clot retrieval L MCA & ACA A3 01/27/2020  . Aspiration into airway 01/27/2020  . Chronic anticoagulation 01/27/2020  . Paroxysmal atrial fibrillation ([place]) 06/15/2018  . Essential hypertension 06/15/2018  . NICM (nonischemic cardiomyopathy) ([place]) 06/15/2018  . Visit for monitoring Tikosyn therapy 06/12/2018    Emonie Espericueta, Annye Rusk MS, CCC-SLP 08/26/2020, 10:28 AM  [facility] [address] [place], [place], [address] Phone: [phone]   Fax:  [phone]   Name: Dustin Schneider MRN: [id number] Date of Birth: 05/19/1956

## 2020-08-26 NOTE — Patient Instructions (Signed)
Access Code: [email or web address] URL: [email or web address] Date: 08/26/2020 Prepared by: Cherly Anderson  Exercises Supine Bridge - 1 x daily - 5 x weekly - 2 sets - 10 reps Supine Hip Internal and External Rotation - 2 x daily - 7 x weekly - 2 sets - 10 reps Clamshell - 1 x daily - 7 x weekly - 3 sets - 10 reps Seated Hip Adduction Isometrics with Ball - 3 x daily - 7 x weekly - 2 sets - 10 reps - 5 hold Side to Side Weight Shift with Counter Support - 2 x daily - 7 x weekly - 2 sets - 10 reps Mini Squat with Counter Support - 2 x daily - 7 x weekly - 2 sets - 10 reps

## 2020-08-27 ENCOUNTER — Other Ambulatory Visit: Payer: Self-pay | Admitting: Cardiology

## 2020-08-29 ENCOUNTER — Ambulatory Visit: Payer: BC Managed Care – PPO | Admitting: Physical Therapy

## 2020-08-29 ENCOUNTER — Ambulatory Visit: Payer: BC Managed Care – PPO

## 2020-08-31 ENCOUNTER — Ambulatory Visit: Payer: BC Managed Care – PPO

## 2020-08-31 ENCOUNTER — Ambulatory Visit: Payer: BC Managed Care – PPO | Admitting: Speech Pathology

## 2020-08-31 ENCOUNTER — Encounter: Payer: Self-pay | Admitting: Speech Pathology

## 2020-08-31 ENCOUNTER — Ambulatory Visit: Payer: BC Managed Care – PPO | Admitting: Physical Therapy

## 2020-08-31 ENCOUNTER — Other Ambulatory Visit: Payer: Self-pay

## 2020-08-31 ENCOUNTER — Telehealth: Payer: Self-pay

## 2020-08-31 DIAGNOSIS — R4701 Aphasia: Secondary | ICD-10-CM

## 2020-08-31 DIAGNOSIS — I69351 Hemiplegia and hemiparesis following cerebral infarction affecting right dominant side: Secondary | ICD-10-CM

## 2020-08-31 DIAGNOSIS — M6281 Muscle weakness (generalized): Secondary | ICD-10-CM

## 2020-08-31 DIAGNOSIS — R2689 Other abnormalities of gait and mobility: Secondary | ICD-10-CM

## 2020-08-31 DIAGNOSIS — R482 Apraxia: Secondary | ICD-10-CM

## 2020-08-31 NOTE — Therapy (Signed)
[facility] [address] [place], [place], [address] Phone: [phone]   Fax:  [phone]  Speech Language Pathology Treatment  Patient Details  Name: Dustin Schneider MRN: [id number] Date of Birth: 05-27-1956 Referring Provider (SLP): Dr. Delice Lesch   Encounter Date: 08/31/2020   End of Session - 08/31/20 1206    Visit Number 21    Number of Visits 40    Date for SLP Re-Evaluation 09/19/20    SLP Start Time 1016    SLP Stop Time  1058    SLP Time Calculation (min) 42 min    Activity Tolerance Patient tolerated treatment well           Past Medical History:  Diagnosis Date  . Hypertension   . NICM (nonischemic cardiomyopathy) ([place]) 06/15/2018   04/23/18: EF 45%, 09/15/18: NOrmal LVEF  . Paroxysmal atrial fibrillation ([place]) 06/15/2018  . Stroke ([place])   . Visit for monitoring Tikosyn therapy 06/12/2018    Past Surgical History:  Procedure Laterality Date  . BUBBLE STUDY  03/11/2020   Procedure: BUBBLE STUDY;  Surgeon: [name], MD;  Location: [facility];  Service: Cardiovascular;;  . CARDIOVERSION N/A 05/13/2018   Procedure: CARDIOVERSION;  Surgeon: [name], MD;  Location: [facility] ENDOSCOPY;  Service: Cardiovascular;  Laterality: N/A;  . CARDIOVERSION N/A 06/13/2018   Procedure: CARDIOVERSION;  Surgeon: [name], MD;  Location: [facility] ENDOSCOPY;  Service: Cardiovascular;  Laterality: N/A;  . CARDIOVERSION N/A 06/23/2018   Procedure: CARDIOVERSION;  Surgeon: [name], MD;  Location: [facility] ENDOSCOPY;  Service: Cardiovascular;  Laterality: N/A;  . CARDIOVERSION N/A 03/11/2020   Procedure: CARDIOVERSION;  Surgeon: [name], MD;  Location: [facility];  Service: Cardiovascular;  Laterality: N/A;  . excision of actinic keratosis    . EYE SURGERY     eye lift  . IR CT HEAD LTD  01/27/2020  . IR PERCUTANEOUS ART THROMBECTOMY/INFUSION INTRACRANIAL INC DIAG ANGIO  01/27/2020      . IR PERCUTANEOUS ART  THROMBECTOMY/INFUSION INTRACRANIAL INC DIAG ANGIO  01/27/2020  . RADIOLOGY WITH ANESTHESIA N/A 01/27/2020   Procedure: IR WITH ANESTHESIA;  Surgeon: Radiologist, Medication, MD;  Location: [facility];  Service: Radiology;  Laterality: N/A;  . TEE WITHOUT CARDIOVERSION N/A 03/11/2020   Procedure: TRANSESOPHAGEAL ECHOCARDIOGRAM (TEE);  Surgeon: [name], MD;  Location: [facility];  Service: Cardiovascular;  Laterality: N/A;  . TONSILLECTOMY    . WISDOM TOOTH EXTRACTION      There were no vitals filed for this visit.   Subjective Assessment - 08/31/20 1023    Subjective Dustin Schneider hands me his speech HW notebook    Patient is accompained by: --   caregiver Roselyn Reef   Currently in Pain? No/denies                 ADULT SLP TREATMENT - 08/31/20 1026      General Information   Behavior/Cognition Alert;Cooperative;Pleasant mood      Treatment Provided   Treatment provided Cognitive-Linquistic      Cognitive-Linquistic Treatment   Treatment focused on Aphasia;Apraxia;Patient/family/caregiver education    Skilled Treatment Targeted verbal expression (word finding and sentence generation) Generating simple sentences with multiple meaning words with extended time and usual mod semantic, phonemic and questioning cues. Cues to use writing to augment speech 2x during task with success. In simple conversation Dustin Schneider required extended time and A from [place] to communicate he is practicing drawing horizontal subjects on a horizontal sketch pad using his left hand  Assessment / Recommendations / Plan   Plan Continue with current plan of care      Progression Toward Goals   Progression toward goals Progressing toward goals              SLP Short Term Goals - 08/31/20 1205      SLP SHORT TERM GOAL #1   Title Pt will name family, friends and art items with occasional min A 8/10 with approximations allowed.    Time 1    Period Weeks    Status Partially Met      SLP SHORT TERM GOAL #2   Title Pt  will name 7 items in personally relevant categories with occasional min A over 2 sessions    Time 1    Period Weeks    Status Partially Met      SLP SHORT TERM GOAL #3   Title Pt will perform 4 automatic speech tasks with rare min A.    Time 1    Period Weeks    Status Partially Met      SLP SHORT TERM GOAL #4   Title Pt will write personal information (name/address/phone), 5 family/friend/pet names and 7  professional words with occasional min A    Time 1    Period Weeks    Status Not Met      SLP SHORT TERM GOAL #5   Title Pt will use mulitmodal communication (gesture, draw, write 1st letter etc) to augment verbal expression to meet needs at home with rare min A from family.    Time 1    Period Weeks    Status Partially Met            SLP Long Term Goals - 08/31/20 1205      SLP LONG TERM GOAL #1   Title Pt will verbalize 3 words to explain or describe his artwork and hobbies with occasional min A over 3 sessions, allowing for intellgible approximations    Baseline 05-17-20 (Lingraphica cues), 05-24-20 (lingraphica), 05-31-20 (lingraphica); 07/25/20 (written word)    Status Achieved      SLP LONG TERM GOAL #2   Title Pt and family will utilize multimodal compensations for aphasia and verbal apraxia to participate in 3 turns each of conversation with occasional min A over 2 sessions    Baseline 07/25/20;    Time 1    Period Weeks    Status Achieved      SLP LONG TERM GOAL #3   Title Pt will write 3 word phrases with less than 2 errors with rare min A over 3 sessions    Status Not Met      SLP LONG TERM GOAL #4   Title Reading assessment if indicated    Status Deferred      SLP LONG TERM GOAL #5   Title Dustin Schneider will use multimodal communication spontaneously  (writing, gesture, device) to augment verbal expression as needed 3/5 times with rare min A    Time 4    Period Weeks    Status On-going      SLP LONG TERM GOAL #6   Title Dustin Schneider will write (approximate) 4 items  in personally relevant category that is understood by family/friends    Baseline 08/22/20; 08/31/20    Time 5    Period Weeks    Status Achieved      SLP LONG TERM GOAL #7   Title Pt will employ multimodal communication to participate in 6 turns in  conversation with extended time and occasional min A    Time 5    Period Weeks    Status On-going            Plan - 08/31/20 1204    Clinical Impression Statement Improving moderate Broca's aphasia and severe verbal apraxia persist, affecting communication of basic wants, needs and social interactions.Dustin Schneider is highly motivated and 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 His utterance length in improving and sentences with accurate syntax are emerging with extended time and significant halting. Ongoing training in multimodal communication - . Continue skilled ST to maximize communication for QOL, safety and reduce family burden.    Speech Therapy Frequency 2x / week    Duration --   20 weeks or 40  visits   Treatment/Interventions Environmental controls;Cueing hierarchy;SLP instruction and feedback;Compensatory strategies;Functional tasks;Cognitive reorganization;Compensatory techniques;Internal/external aids;Multimodal communcation approach;Patient/family education;Language facilitation    Potential to Achieve Goals Good           Patient will benefit from skilled therapeutic intervention in order to improve the following deficits and impairments:   Aphasia  Verbal apraxia    Problem List Patient Active Problem List   Diagnosis Date Noted  . Benign essential HTN 06/09/2020  . Abnormality of gait 04/28/2020  . Neurogenic bladder 03/17/2020  . Sepsis due to pneumonia ([place]) 03/06/2020  . History of stroke with residual deficit 03/06/2020  . Transient hypotension 03/06/2020  . Spastic hemiplegia affecting nondominant side ([place])   . History of hypertension   . Expressive aphasia   . AKI  (acute kidney injury) ([place])   . Global aphasia   . Hypoalbuminemia due to protein-calorie malnutrition ([place])   . Dysphagia, post-stroke   . Hyperlipidemia 02/02/2020  . Dysphagia following cerebral infarction 02/02/2020  . Aspiration pneumonia ([place]) 02/02/2020  . Acute ischemic left MCA stroke ([place]) 02/02/2020  . Acute respiratory failure ([place])   . Acute ischemic stroke [facility]) s/p clot retrieval L MCA & ACA A3 01/27/2020  . Aspiration into airway 01/27/2020  . Chronic anticoagulation 01/27/2020  . Paroxysmal atrial fibrillation ([place]) 06/15/2018  . Essential hypertension 06/15/2018  . NICM (nonischemic cardiomyopathy) ([place]) 06/15/2018  . Visit for monitoring Tikosyn therapy 06/12/2018    Nettie Cromwell, Annye Rusk MS, CCC-SLP 08/31/2020, 12:07 PM  [facility] [address] [place], [place], [address] Phone: [phone]   Fax:  [phone]   Name: Dustin Schneider MRN: [id number] Date of Birth: 1956-06-24

## 2020-08-31 NOTE — Therapy (Signed)
[place] [address] [place], [place], [address] Phone: [phone]   Fax:  [phone]  Physical Therapy Treatment  Patient Details  Name: Dustin Schneider MRN: [id number] Date of Birth: 1956-02-04 Referring Provider (PT): Lauraine Rinne, PA-C but followed by Delice Lesch   Encounter Date: 08/31/2020   PT End of Session - 08/31/20 1107    Visit Number 13    Number of Visits 78    Date for PT Re-Evaluation 10/25/20    Authorization Type BCBS    PT Start Time 1105    PT Stop Time 1145    PT Time Calculation (min) 40 min    Equipment Utilized During Treatment Gait belt    Activity Tolerance Patient tolerated treatment well    Behavior During Therapy [facility] for tasks assessed/performed           Past Medical History:  Diagnosis Date  . Hypertension   . NICM (nonischemic cardiomyopathy) ([place]) 06/15/2018   04/23/18: EF 45%, 09/15/18: NOrmal LVEF  . Paroxysmal atrial fibrillation ([place]) 06/15/2018  . Stroke ([place])   . Visit for monitoring Tikosyn therapy 06/12/2018    Past Surgical History:  Procedure Laterality Date  . BUBBLE STUDY  03/11/2020   Procedure: BUBBLE STUDY;  Surgeon: [name], MD;  Location: [place];  Service: Cardiovascular;;  . CARDIOVERSION N/A 05/13/2018   Procedure: CARDIOVERSION;  Surgeon: [name], MD;  Location: [facility] ENDOSCOPY;  Service: Cardiovascular;  Laterality: N/A;  . CARDIOVERSION N/A 06/13/2018   Procedure: CARDIOVERSION;  Surgeon: [name], MD;  Location: [facility] ENDOSCOPY;  Service: Cardiovascular;  Laterality: N/A;  . CARDIOVERSION N/A 06/23/2018   Procedure: CARDIOVERSION;  Surgeon: [name], MD;  Location: [facility] ENDOSCOPY;  Service: Cardiovascular;  Laterality: N/A;  . CARDIOVERSION N/A 03/11/2020   Procedure: CARDIOVERSION;  Surgeon: [name], MD;  Location: [place];  Service: Cardiovascular;  Laterality: N/A;  . excision of actinic keratosis    . EYE SURGERY     eye  lift  . IR CT HEAD LTD  01/27/2020  . IR PERCUTANEOUS ART THROMBECTOMY/INFUSION INTRACRANIAL INC DIAG ANGIO  01/27/2020      . IR PERCUTANEOUS ART THROMBECTOMY/INFUSION INTRACRANIAL INC DIAG ANGIO  01/27/2020  . RADIOLOGY WITH ANESTHESIA N/A 01/27/2020   Procedure: IR WITH ANESTHESIA;  Surgeon: Radiologist, Medication, MD;  Location: [place];  Service: Radiology;  Laterality: N/A;  . TEE WITHOUT CARDIOVERSION N/A 03/11/2020   Procedure: TRANSESOPHAGEAL ECHOCARDIOGRAM (TEE);  Surgeon: [name], MD;  Location: [place];  Service: Cardiovascular;  Laterality: N/A;  . TONSILLECTOMY    . WISDOM TOOTH EXTRACTION      There were no vitals filed for this visit.   Subjective Assessment - 08/31/20 1107    Subjective Pt denies any new issues. Caregiver does verbalize that a friend built a platform off front step that just fits walker as step was really big.    Pertinent History PMH: HTN, paroxysmal a fib    Limitations Standing;Walking    Patient Stated Goals "wants everything working on R side again"    Currently in Pain? No/denies    Pain Onset 1 to 4 weeks ago                             [facility] Adult PT Treatment/Exercise - 08/31/20 1134      Transfers   Transfers Sit to Stand;Stand to Sit    Sit to Stand 5: Supervision  Stand to Sit 5: Supervision      Ambulation/Gait   Ambulation/Gait Yes    Ambulation/Gait Assistance 5: Supervision;4: Min guard;4: Min assist    Ambulation/Gait Assistance Details Pt ambulated first bout with verbal and tactile cues to focus on right foot placement with PT facilitating some at pelvis. Also cued to stand up tall and slow down at times. Prior to second bout performed the stretching in to right hip IR and adductor activation. Did not less kicking back leg of walker after this. Last bout utilized cane with quad tip for first time with verbal cues for sequencing and to slow down and stay up tall. Min assist with cane.    Ambulation Distance  (Feet) 230 Feet   115' x 1 with walker, 200' x 1 with cane   Assistive device Rolling walker;Straight cane   right AFO, left hand grip attachment   Gait Pattern Step-through pattern;Decreased hip/knee flexion - right;Poor foot clearance - right    Ambulation Surface Level;Indoor    Gait Comments PT replaced velcro strap on hand grip again as not sticking well.      Neuro Re-ed    Neuro Re-ed Details  In // bars: taping floor dots with RLE on command x 5 min. Pt had more difficulty with stepping leg back off especially when target forward or to the right. PT provided support at pelvis to prevent trunk substitution. Tapping 2 therastones (1 in front and one to front/right) x 3 each. Side stepping over 3 yard sticks with 1 UE support with verbal cues to focus on foot clearance. Pt bumped sticks a couple time with right foot but improved when slowed down. In // bars: walking with 1 UE support 8' x 6. Pt was trying to go without UE support but had significant decrease in right SLS time. PT explained why better to have 1 support. Tapping 6" step with LLE x 10 with cane support to increase right weight shift then standing with LLE on step 20 sec x 2 with tactile cues at right knee to maintain contraction. Sit to stand with 2x4 black beam under left foot to increase right weight shift x 10 CGA.                    PT Short Term Goals - 08/26/20 0958      PT SHORT TERM GOAL #1   Title Pt will decrease TUG from 21 sec to <18 sec for improved balance and functional mobility.    Baseline 08/26/20 21 sec with RW    Time 4    Period Weeks    Status New    Target Date 09/25/20      PT SHORT TERM GOAL #2   Title Pt will ambulate >800' on varied surfaces with RW/right hand grip attachment and AFO supervision for improved short community distances.    Baseline 600' CGA with walker    Time 4    Period Weeks    Status New    Target Date 09/25/20             PT Long Term Goals - 08/26/20 1000       PT LONG TERM GOAL #1   Title Pt and pt's family members will be independent with final HEP in order to build upon functional gains made in therapy. ALL LTGS DUE3/15/22    Baseline PT continues to update HEP as pt is progressing. Most recent update 08/26/20    Time 8  Period Weeks    Status On-going    Target Date 10/25/20      PT LONG TERM GOAL #2   Title Pt will ambulate 250' with cane and right AFO CGA for improved household mobility on level surfaces.    Time 8    Period Weeks    Status New    Target Date 10/25/20      PT LONG TERM GOAL #3   Title Pt will increase Berg from 38 to >42/56 for improved balance and decreased fall risk.    Baseline 08/26/20 38/56    Time 8    Period Weeks    Status New    Target Date 10/25/20      PT LONG TERM GOAL #4   Title Pt will ambulate up/down 2 steps with LRAD supervision for improved access in home to living room    Baseline CGA with RW    Time 8    Period Weeks    Status New    Target Date 10/25/20      PT LONG TERM GOAL #5   Title Pt will increase gait speed from 0.46m/s to >0.7m/s for improved community mobility.    Baseline 08/26/20 0.62m/s    Time 8    Period Weeks    Status New    Target Date 10/25/20                 Plan - 08/31/20 1218    Clinical Impression Statement PT continued to focus on gait progression today. Able to decrease right hip abd/ER some after stretching and hip adductor activation activities. Trialed cane with quad tip for first time and pt picked up sequencing well. Needed cues to slow down to focus on RLE placement.    Personal Factors and Comorbidities Comorbidity 3+    Comorbidities dense L MCA CVA, paroxysmal A. fib on chronic Xarelto, nonischemic cardiomyopathy and hypertension.    Examination-Activity Limitations Bathing;Bed Mobility;Bend;Dressing;Hygiene/Grooming;Stand;Squat;Locomotion Level;Transfers    Examination-Participation Restrictions Community Activity;Yard Work;Shop   walking  his dog, works as an Electrical engineer Evolving/Moderate complexity    Rehab Potential Good    PT Frequency 2x / week    PT Duration 8 weeks    PT Treatment/Interventions ADLs/Self Care Home Management;Aquatic Therapy;DME Instruction;Gait training;Stair training;Functional mobility training;Therapeutic activities;Therapeutic exercise;Electrical Stimulation;Balance training;Neuromuscular re-education;Wheelchair mobility training;Orthotic Fit/Training;Patient/family education;Passive range of motion;Energy conservation    PT Next Visit Plan Continue gait training with RW and with cane with quad tip. Try to get more hip adductor activation.  stair training, obstacle negotiation, gait outdoors and over unlevel surfaces, curb training with RW. RLE NMR with decr UE support, balance on compliant surfaces.    PT Home Exercise Plan on 06/28/20 - added standing at countetop and stepping LLE out and in    Consulted and Agree with Plan of Care Patient;Family member/caregiver    Family Member Consulted [name]           Patient will benefit from skilled therapeutic intervention in order to improve the following deficits and impairments:  Abnormal gait,Decreased activity tolerance,Decreased balance,Decreased cognition,Decreased mobility,Decreased coordination,Decreased range of motion,Decreased endurance,Decreased strength,Hypomobility,Difficulty walking,Impaired UE functional use,Impaired vision/preception,Postural dysfunction,Impaired sensation  Visit Diagnosis: Other abnormalities of gait and mobility  Muscle weakness (generalized)  Hemiplegia and hemiparesis following cerebral infarction affecting right dominant side [facility])     Problem List Patient Active Problem List   Diagnosis Date Noted  . Benign essential HTN 06/09/2020  . Abnormality of gait 04/28/2020  .  Neurogenic bladder 03/17/2020  . Sepsis due to pneumonia ([place]) 03/06/2020  . History of stroke  with residual deficit 03/06/2020  . Transient hypotension 03/06/2020  . Spastic hemiplegia affecting nondominant side ([place])   . History of hypertension   . Expressive aphasia   . AKI (acute kidney injury) ([place])   . Global aphasia   . Hypoalbuminemia due to protein-calorie malnutrition ([place])   . Dysphagia, post-stroke   . Hyperlipidemia 02/02/2020  . Dysphagia following cerebral infarction 02/02/2020  . Aspiration pneumonia ([place]) 02/02/2020  . Acute ischemic left MCA stroke ([place]) 02/02/2020  . Acute respiratory failure ([place])   . Acute ischemic stroke [facility]) s/p clot retrieval L MCA & ACA A3 01/27/2020  . Aspiration into airway 01/27/2020  . Chronic anticoagulation 01/27/2020  . Paroxysmal atrial fibrillation ([place]) 06/15/2018  . Essential hypertension 06/15/2018  . NICM (nonischemic cardiomyopathy) ([place]) 06/15/2018  . Visit for monitoring Tikosyn therapy 06/12/2018    Electa Sniff, PT, DPT, NCS 08/31/2020, 12:20 PM  [place] [address] [place], [place], [address] Phone: [phone]   Fax:  [phone]  Name: Dustin Schneider MRN: [id number] Date of Birth: Apr 03, 1956

## 2020-09-01 ENCOUNTER — Other Ambulatory Visit: Payer: Self-pay

## 2020-09-01 MED ORDER — APIXABAN 5 MG PO TABS: 5.0000 mg | ORAL_TABLET | Freq: Two times a day (BID) | ORAL | 6 refills | Status: DC

## 2020-09-02 ENCOUNTER — Encounter: Payer: Self-pay | Admitting: Speech Pathology

## 2020-09-02 ENCOUNTER — Ambulatory Visit: Payer: BC Managed Care – PPO | Admitting: Speech Pathology

## 2020-09-02 ENCOUNTER — Ambulatory Visit: Payer: BC Managed Care – PPO | Admitting: Physical Therapy

## 2020-09-02 ENCOUNTER — Encounter: Payer: Self-pay | Admitting: Physical Therapy

## 2020-09-02 ENCOUNTER — Other Ambulatory Visit: Payer: Self-pay

## 2020-09-02 DIAGNOSIS — M6281 Muscle weakness (generalized): Secondary | ICD-10-CM

## 2020-09-02 DIAGNOSIS — R262 Difficulty in walking, not elsewhere classified: Secondary | ICD-10-CM

## 2020-09-02 DIAGNOSIS — R4701 Aphasia: Secondary | ICD-10-CM

## 2020-09-02 DIAGNOSIS — R2681 Unsteadiness on feet: Secondary | ICD-10-CM

## 2020-09-02 DIAGNOSIS — I69351 Hemiplegia and hemiparesis following cerebral infarction affecting right dominant side: Secondary | ICD-10-CM | POA: Diagnosis not present

## 2020-09-02 DIAGNOSIS — R482 Apraxia: Secondary | ICD-10-CM

## 2020-09-02 DIAGNOSIS — R2689 Other abnormalities of gait and mobility: Secondary | ICD-10-CM

## 2020-09-02 NOTE — Therapy (Signed)
[facility] [address] [place], [place], [address] Phone: [phone]   Fax:  [phone]  Physical Therapy Treatment  Patient Details  Name: Dustin Schneider MRN: [id number] Date of Birth: 1956/07/11 Referring Provider (PT): Lauraine Rinne, PA-C but followed by Delice Lesch   Encounter Date: 09/02/2020   PT End of Session - 09/02/20 1315    Visit Number 60    Number of Visits 11    Date for PT Re-Evaluation 10/25/20    Authorization Type BCBS    PT Start Time 0843    PT Stop Time 0929    PT Time Calculation (min) 46 min    Equipment Utilized During Treatment Gait belt    Activity Tolerance Patient tolerated treatment well    Behavior During Therapy [facility] for tasks assessed/performed           Past Medical History:  Diagnosis Date  . Hypertension   . NICM (nonischemic cardiomyopathy) ([place]) 06/15/2018   04/23/18: EF 45%, 09/15/18: NOrmal LVEF  . Paroxysmal atrial fibrillation ([place]) 06/15/2018  . Stroke ([place])   . Visit for monitoring Tikosyn therapy 06/12/2018    Past Surgical History:  Procedure Laterality Date  . BUBBLE STUDY  03/11/2020   Procedure: BUBBLE STUDY;  Surgeon: [name], MD;  Location: [place];  Service: Cardiovascular;;  . CARDIOVERSION N/A 05/13/2018   Procedure: CARDIOVERSION;  Surgeon: [name], MD;  Location: [facility] ENDOSCOPY;  Service: Cardiovascular;  Laterality: N/A;  . CARDIOVERSION N/A 06/13/2018   Procedure: CARDIOVERSION;  Surgeon: [name], MD;  Location: [facility] ENDOSCOPY;  Service: Cardiovascular;  Laterality: N/A;  . CARDIOVERSION N/A 06/23/2018   Procedure: CARDIOVERSION;  Surgeon: [name], MD;  Location: [facility] ENDOSCOPY;  Service: Cardiovascular;  Laterality: N/A;  . CARDIOVERSION N/A 03/11/2020   Procedure: CARDIOVERSION;  Surgeon: [name], MD;  Location: [place];  Service: Cardiovascular;  Laterality: N/A;  . excision of actinic keratosis    . EYE SURGERY     eye  lift  . IR CT HEAD LTD  01/27/2020  . IR PERCUTANEOUS ART THROMBECTOMY/INFUSION INTRACRANIAL INC DIAG ANGIO  01/27/2020      . IR PERCUTANEOUS ART THROMBECTOMY/INFUSION INTRACRANIAL INC DIAG ANGIO  01/27/2020  . RADIOLOGY WITH ANESTHESIA N/A 01/27/2020   Procedure: IR WITH ANESTHESIA;  Surgeon: Radiologist, Medication, MD;  Location: [place];  Service: Radiology;  Laterality: N/A;  . TEE WITHOUT CARDIOVERSION N/A 03/11/2020   Procedure: TRANSESOPHAGEAL ECHOCARDIOGRAM (TEE);  Surgeon: [name], MD;  Location: [place];  Service: Cardiovascular;  Laterality: N/A;  . TONSILLECTOMY    . WISDOM TOOTH EXTRACTION      There were no vitals filed for this visit.   Subjective Assessment - 09/02/20 0846    Subjective No changes since he was last here. Excited to use the cane again today.    Pertinent History PMH: HTN, paroxysmal a fib    Limitations Standing;Walking    Patient Stated Goals "wants everything working on R side again"    Currently in Pain? No/denies    Pain Onset 1 to 4 weeks ago                             [facility] Adult PT Treatment/Exercise - 09/02/20 0001      Transfers   Transfers Sit to Stand;Stand to Sit    Sit to Stand 5: Supervision    Stand to Sit 5: Supervision  Ambulation/Gait   Ambulation/Gait Yes    Ambulation/Gait Assistance 5: Supervision;4: Min guard;4: Min assist    Ambulation/Gait Assistance Details supervision ambulating in and out of session with RW, performed an additional lap of gait with RW after hip IR stretching and hip ADD exercises with pt demonstrating improved foot clearance and less bumping RLE on back leg of RW. performed additional gait with SPC with quad tip - min A for balance and intermittently for RLE foot clearance, cues for tall posture esp towards end of bout    Ambulation Distance (Feet) 115 Feet   RW, 115 x 2 with SPC with quad tip   Assistive device Rolling walker;Straight cane   R AFO, and with quad tip   Gait  Pattern Step-through pattern;Decreased hip/knee flexion - right;Poor foot clearance - right    Ambulation Surface Level;Indoor      Neuro Re-ed    Neuro Re-ed Details  in // bars: stepping over obstacles - 2 yardsticks and 2 2" black foam beams, down and back x5 reps with single UE support, cues for slowed and controlled and for deliberate foot clearance with RLE (sometimes getting foot stuck), needing intermittent assist      Knee/Hip Exercises: Stretches   Other Knee/Hip Stretches pt supine therapist performing R hip IR stretch, 5 x 30 seconds      Knee/Hip Exercises: Standing   Forward Step Up Right;1 set;10 reps;Hand Hold: 1;Step Height: 4"    Forward Step Up Limitations stepping up first with RLE and down first with LLE with focus on RLE foot clearance from hip to clear step, pt intermittently getting foot stuck on step    Other Standing Knee Exercises standing with LUE support: mini squat with ball squeeze for hip ADD x10 reps      Knee/Hip Exercises: Supine   Hip Adduction Isometric Strengthening;10 reps    Hip Adduction Isometric Limitations in hooklying position    Bridges with Ball Squeeze Strengthening;Both;10 reps   discussed adding in ball squeeze when performing bridging at home                 PT Education - 09/02/20 1312    Education Details performing hip ADD exercises and hip IR stretches in the morning before getting up and walking due to improvements in gait afterwards, looking into getting pt a pair of velcro sneakers for pt to potentially indepedently put shoes on/off - showed new balance option on [employer]) Educated Patient   wilson   Methods Explanation    Comprehension Verbalized understanding            PT Short Term Goals - 08/26/20 0958      PT SHORT TERM GOAL #1   Title Pt will decrease TUG from 21 sec to <18 sec for improved balance and functional mobility.    Baseline 08/26/20 21 sec with RW    Time 4    Period Weeks    Status New     Target Date 09/25/20      PT SHORT TERM GOAL #2   Title Pt will ambulate >800' on varied surfaces with RW/right hand grip attachment and AFO supervision for improved short community distances.    Baseline 600' CGA with walker    Time 4    Period Weeks    Status New    Target Date 09/25/20             PT Long Term Goals - 08/26/20 1000  PT LONG TERM GOAL #1   Title Pt and pt's family members will be independent with final HEP in order to build upon functional gains made in therapy. ALL LTGS DUE3/15/22    Baseline PT continues to update HEP as pt is progressing. Most recent update 08/26/20    Time 8    Period Weeks    Status On-going    Target Date 10/25/20      PT LONG TERM GOAL #2   Title Pt will ambulate 250' with cane and right AFO CGA for improved household mobility on level surfaces.    Time 8    Period Weeks    Status New    Target Date 10/25/20      PT LONG TERM GOAL #3   Title Pt will increase Berg from 38 to >42/56 for improved balance and decreased fall risk.    Baseline 08/26/20 38/56    Time 8    Period Weeks    Status New    Target Date 10/25/20      PT LONG TERM GOAL #4   Title Pt will ambulate up/down 2 steps with LRAD supervision for improved access in home to living room    Baseline CGA with RW    Time 8    Period Weeks    Status New    Target Date 10/25/20      PT LONG TERM GOAL #5   Title Pt will increase gait speed from 0.49m/s to >0.73m/s for improved community mobility.    Baseline 08/26/20 0.48m/s    Time 8    Period Weeks    Status New    Target Date 10/25/20                 Plan - 09/02/20 1325    Clinical Impression Statement Pt demonstrating improvements in gait with RLE foot placement with RW after hip IR stretching and hip adductor strengthening activities at start of session. continued gait training with SPC with quad tip, pt needing min A for balance and intermittent assist for improved clearance of RLE. Will continue  to progress towards LTGs.    Personal Factors and Comorbidities Comorbidity 3+    Comorbidities dense L MCA CVA, paroxysmal A. fib on chronic Xarelto, nonischemic cardiomyopathy and hypertension.    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, works as an Optometrist Evolving/Moderate complexity    Rehab Potential Good    PT Frequency 2x / week    PT Duration 8 weeks    PT Treatment/Interventions ADLs/Self Care Home Management;Aquatic Therapy;DME Instruction;Gait training;Stair training;Functional mobility training;Therapeutic activities;Therapeutic exercise;Electrical Stimulation;Balance training;Neuromuscular re-education;Wheelchair mobility training;Orthotic Fit/Training;Patient/family education;Passive range of motion;Energy conservation    PT Next Visit Plan Continue gait training with RW and with cane with quad tip. Try to get more hip adductor activation.  stair training, obstacle negotiation, gait outdoors and over unlevel surfaces, curb training with RW. RLE NMR with decr UE support, balance on compliant surfaces.    PT Home Exercise Plan on 06/28/20 - added standing at countetop and stepping LLE out and in    Consulted and Agree with Plan of Care Patient;Family member/caregiver    Family Member Consulted [name]           Patient will benefit from skilled therapeutic intervention in order to improve the following deficits and impairments:  Abnormal gait,Decreased activity tolerance,Decreased balance,Decreased cognition,Decreased mobility,Decreased coordination,Decreased range  of motion,Decreased endurance,Decreased strength,Hypomobility,Difficulty walking,Impaired UE functional use,Impaired vision/preception,Postural dysfunction,Impaired sensation  Visit  Diagnosis: Other abnormalities of gait and mobility  Muscle weakness (generalized)  Hemiplegia and hemiparesis following cerebral infarction affecting right dominant side (HCC)  Unsteadiness on feet  Difficulty in walking, not elsewhere classified     Problem List Patient Active Problem List   Diagnosis Date Noted  . Benign essential HTN 06/09/2020  . Abnormality of gait 04/28/2020  . Neurogenic bladder 03/17/2020  . Sepsis due to pneumonia ([place]) 03/06/2020  . History of stroke with residual deficit 03/06/2020  . Transient hypotension 03/06/2020  . Spastic hemiplegia affecting nondominant side ([place])   . History of hypertension   . Expressive aphasia   . AKI (acute kidney injury) ([place])   . Global aphasia   . Hypoalbuminemia due to protein-calorie malnutrition ([place])   . Dysphagia, post-stroke   . Hyperlipidemia 02/02/2020  . Dysphagia following cerebral infarction 02/02/2020  . Aspiration pneumonia ([place]) 02/02/2020  . Acute ischemic left MCA stroke ([place]) 02/02/2020  . Acute respiratory failure ([place])   . Acute ischemic stroke [facility]) s/p clot retrieval L MCA & ACA A3 01/27/2020  . Aspiration into airway 01/27/2020  . Chronic anticoagulation 01/27/2020  . Paroxysmal atrial fibrillation ([place] [address]) 06/15/2018  . Essential hypertension 06/15/2018  . NICM (nonischemic cardiomyopathy) ([place]) 06/15/2018  . Visit for monitoring Tikosyn therapy 06/12/2018    Arliss Journey, PT, DPT  09/02/2020, 1:27 PM  [place] [address] [place], [place], [address] Phone: [phone]   Fax:  [phone]  Name: Dustin Schneider MRN: [id number] Date of Birth: Aug 05, 1956

## 2020-09-02 NOTE — Therapy (Signed)
[facility] [address] [place], [place], [address] Phone: [phone]   Fax:  [phone]  Speech Language Pathology Treatment  Patient Details  Name: Dustin Schneider MRN: [id number] Date of Birth: 1955/12/15 Referring Provider (SLP): Dr. Delice Lesch   Encounter Date: 09/02/2020   End of Session - 09/02/20 1147    Visit Number 22    Number of Visits 40    Date for SLP Re-Evaluation 09/19/20    SLP Start Time 0932    SLP Stop Time  9030    SLP Time Calculation (min) 42 min           Past Medical History:  Diagnosis Date  . Hypertension   . NICM (nonischemic cardiomyopathy) ([place]) 06/15/2018   04/23/18: EF 45%, 09/15/18: NOrmal LVEF  . Paroxysmal atrial fibrillation ([place]) 06/15/2018  . Stroke ([place])   . Visit for monitoring Tikosyn therapy 06/12/2018    Past Surgical History:  Procedure Laterality Date  . BUBBLE STUDY  03/11/2020   Procedure: BUBBLE STUDY;  Surgeon: [name], MD;  Location: [facility];  Service: Cardiovascular;;  . CARDIOVERSION N/A 05/13/2018   Procedure: CARDIOVERSION;  Surgeon: [name], MD;  Location: [facility] ENDOSCOPY;  Service: Cardiovascular;  Laterality: N/A;  . CARDIOVERSION N/A 06/13/2018   Procedure: CARDIOVERSION;  Surgeon: [name], MD;  Location: [facility] ENDOSCOPY;  Service: Cardiovascular;  Laterality: N/A;  . CARDIOVERSION N/A 06/23/2018   Procedure: CARDIOVERSION;  Surgeon: [name], MD;  Location: [facility] ENDOSCOPY;  Service: Cardiovascular;  Laterality: N/A;  . CARDIOVERSION N/A 03/11/2020   Procedure: CARDIOVERSION;  Surgeon: [name], MD;  Location: [facility];  Service: Cardiovascular;  Laterality: N/A;  . excision of actinic keratosis    . EYE SURGERY     eye lift  . IR CT HEAD LTD  01/27/2020  . IR PERCUTANEOUS ART THROMBECTOMY/INFUSION INTRACRANIAL INC DIAG ANGIO  01/27/2020      . IR PERCUTANEOUS ART THROMBECTOMY/INFUSION INTRACRANIAL INC DIAG ANGIO  01/27/2020  .  RADIOLOGY WITH ANESTHESIA N/A 01/27/2020   Procedure: IR WITH ANESTHESIA;  Surgeon: Radiologist, Medication, MD;  Location: [facility];  Service: Radiology;  Laterality: N/A;  . TEE WITHOUT CARDIOVERSION N/A 03/11/2020   Procedure: TRANSESOPHAGEAL ECHOCARDIOGRAM (TEE);  Surgeon: [name], MD;  Location: [facility];  Service: Cardiovascular;  Laterality: N/A;  . TONSILLECTOMY    . WISDOM TOOTH EXTRACTION      There were no vitals filed for this visit.   Subjective Assessment - 09/02/20 0938    Subjective "I forgot Dustin Schneider HW"    Patient is accompained by: Family member   Dustin Schneider   Currently in Pain? No/denies                 ADULT SLP TREATMENT - 09/02/20 0940      General Information   Behavior/Cognition Alert;Cooperative;Pleasant mood      Treatment Provided   Treatment provided Cognitive-Linquistic      Cognitive-Linquistic Treatment   Treatment focused on Aphasia;Apraxia;Patient/family/caregiver education    Skilled Treatment Targeted motor speech and veral expression reading words aloud and explaining which doesn't go and why (sentence generation) with consistent extended time and frequent mod written, semantic and questioning cues to generate reason/ word doesn't belong. Simple conversation explaning a sailing trip he plans to take, with occasional questioning cues and requests for repeptition/clarification.      Assessment / Recommendations / Plan   Plan Continue with current plan of care      Progression  Toward Goals   Progression toward goals Progressing toward goals            SLP Education - 09/02/20 1016    Education Details read aloud 5-10 minutes twice daily - slow rate    Person(s) Educated Patient;Caregiver(s)    Methods Explanation;Handout    Comprehension Verbalized understanding;Verbal cues required            SLP Short Term Goals - 09/02/20 1147      SLP SHORT TERM GOAL #1   Title Pt will name family, friends and art items with occasional min A  8/10 with approximations allowed.    Time 1    Period Weeks    Status Partially Met      SLP SHORT TERM GOAL #2   Title Pt will name 7 items in personally relevant categories with occasional min A over 2 sessions    Time 1    Period Weeks    Status Partially Met      SLP SHORT TERM GOAL #3   Title Pt will perform 4 automatic speech tasks with rare min A.    Time 1    Period Weeks    Status Partially Met      SLP SHORT TERM GOAL #4   Title Pt will write personal information (name/address/phone), 5 family/friend/pet names and 7  professional words with occasional min A    Time 1    Period Weeks    Status Not Met      SLP SHORT TERM GOAL #5   Title Pt will use mulitmodal communication (gesture, draw, write 1st letter etc) to augment verbal expression to meet needs at home with rare min A from family.    Time 1    Period Weeks    Status Partially Met            SLP Long Term Goals - 09/02/20 1147      SLP LONG TERM GOAL #1   Title Pt will verbalize 3 words to explain or describe his artwork and hobbies with occasional min A over 3 sessions, allowing for intellgible approximations    Baseline 05-17-20 (Lingraphica cues), 05-24-20 (lingraphica), 05-31-20 (lingraphica); 07/25/20 (written word)    Status Achieved      SLP LONG TERM GOAL #2   Title Pt and family will utilize multimodal compensations for aphasia and verbal apraxia to participate in 3 turns each of conversation with occasional min A over 2 sessions    Baseline 07/25/20;    Time 1    Period Weeks    Status Achieved      SLP LONG TERM GOAL #3   Title Pt will write 3 word phrases with less than 2 errors with rare min A over 3 sessions    Status Not Met      SLP LONG TERM GOAL #4   Title Reading assessment if indicated    Status Deferred      SLP LONG TERM GOAL #5   Title Dustin Schneider will use multimodal communication spontaneously  (writing, gesture, device) to augment verbal expression as needed 3/5 times with rare  min A    Time 4    Period Weeks    Status On-going      SLP LONG TERM GOAL #6   Title Dustin Schneider will write (approximate) 4 items in personally relevant category that is understood by family/friends    Baseline 08/22/20; 08/31/20    Time 5    Period [place]  Status Achieved      SLP LONG TERM GOAL #7   Title Pt will employ multimodal communication to participate in 6 turns in conversation with extended time and occasional min A    Time 5    Period Weeks    Status On-going            Plan - 09/02/20 1145    Clinical Impression Statement Improving moderate Broca's aphasia and severe verbal apraxia persist, affecting communication of basic wants, needs and social interactions.Dustin Schneider is highly motivated and 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 His utterance length in improving and sentences with accurate syntax are emerging with extended time and significant halting. Ongoing training in multimodal communication - . Continue skilled ST to maximize communication for QOL, safety and reduce family burden.    Speech Therapy Frequency 2x / week    Duration --   20 weeks or 40 visits   Treatment/Interventions Environmental controls;Cueing hierarchy;SLP instruction and feedback;Compensatory strategies;Functional tasks;Cognitive reorganization;Compensatory techniques;Internal/external aids;Multimodal communcation approach;Patient/family education;Language facilitation    Potential to Achieve Goals Good    Potential Considerations Severity of impairments           Patient will benefit from skilled therapeutic intervention in order to improve the following deficits and impairments:   Aphasia  Verbal apraxia    Problem List Patient Active Problem List   Diagnosis Date Noted  . Benign essential HTN 06/09/2020  . Abnormality of gait 04/28/2020  . Neurogenic bladder 03/17/2020  . Sepsis due to pneumonia ([place]) 03/06/2020  . History of stroke with  residual deficit 03/06/2020  . Transient hypotension 03/06/2020  . Spastic hemiplegia affecting nondominant side ([place])   . History of hypertension   . Expressive aphasia   . AKI (acute kidney injury) ([place])   . Global aphasia   . Hypoalbuminemia due to protein-calorie malnutrition ([place])   . Dysphagia, post-stroke   . Hyperlipidemia 02/02/2020  . Dysphagia following cerebral infarction 02/02/2020  . Aspiration pneumonia ([place]) 02/02/2020  . Acute ischemic left MCA stroke ([place]) 02/02/2020  . Acute respiratory failure ([place])   . Acute ischemic stroke [facility]) s/p clot retrieval L MCA & ACA A3 01/27/2020  . Aspiration into airway 01/27/2020  . Chronic anticoagulation 01/27/2020  . Paroxysmal atrial fibrillation ([place]) 06/15/2018  . Essential hypertension 06/15/2018  . NICM (nonischemic cardiomyopathy) ([place]) 06/15/2018  . Visit for monitoring Tikosyn therapy 06/12/2018    Kaarin Pardy, Annye Rusk MS, CCC-SLP 09/02/2020, 11:48 AM  [facility] [address] [place], [place], [address] Phone: [phone]   Fax:  [phone]   Name: Dustin Schneider MRN: [id number] Date of Birth: 1956/06/08

## 2020-09-05 ENCOUNTER — Other Ambulatory Visit: Payer: Self-pay

## 2020-09-05 ENCOUNTER — Ambulatory Visit: Payer: BC Managed Care – PPO | Admitting: Speech Pathology

## 2020-09-05 ENCOUNTER — Encounter: Payer: Self-pay | Admitting: Speech Pathology

## 2020-09-05 ENCOUNTER — Encounter: Payer: Self-pay | Admitting: Physical Therapy

## 2020-09-05 ENCOUNTER — Ambulatory Visit: Payer: BC Managed Care – PPO | Admitting: Physical Therapy

## 2020-09-05 DIAGNOSIS — I69351 Hemiplegia and hemiparesis following cerebral infarction affecting right dominant side: Secondary | ICD-10-CM | POA: Diagnosis not present

## 2020-09-05 DIAGNOSIS — R262 Difficulty in walking, not elsewhere classified: Secondary | ICD-10-CM

## 2020-09-05 DIAGNOSIS — R482 Apraxia: Secondary | ICD-10-CM

## 2020-09-05 DIAGNOSIS — R2689 Other abnormalities of gait and mobility: Secondary | ICD-10-CM

## 2020-09-05 DIAGNOSIS — R4701 Aphasia: Secondary | ICD-10-CM

## 2020-09-05 DIAGNOSIS — M6281 Muscle weakness (generalized): Secondary | ICD-10-CM

## 2020-09-05 DIAGNOSIS — R2681 Unsteadiness on feet: Secondary | ICD-10-CM

## 2020-09-05 NOTE — Therapy (Signed)
[facility] [address] [place], [place], [address] Phone: [phone]   Fax:  [phone]  Speech Language Pathology Treatment  Patient Details  Name: Dustin Schneider MRN: [id number] Date of Birth: [birth year] Referring Provider (SLP): Dr. Delice Lesch   Encounter Date: 09/05/2020   End of Session - 09/05/20 0931    Visit Number 23    Number of Visits 40    Date for SLP Re-Evaluation 09/19/20    SLP Start Time 0847    SLP Stop Time  0932    SLP Time Calculation (min) 45 min    Activity Tolerance Patient tolerated treatment well           Past Medical History:  Diagnosis Date  . Hypertension   . NICM (nonischemic cardiomyopathy) ([place]) 06/15/2018   04/23/18: EF 45%, 09/15/18: NOrmal LVEF  . Paroxysmal atrial fibrillation ([place]) 06/15/2018  . Stroke ([place])   . Visit for monitoring Tikosyn therapy 06/12/2018    Past Surgical History:  Procedure Laterality Date  . BUBBLE STUDY  03/11/2020   Procedure: BUBBLE STUDY;  Surgeon: [name], MD;  Location: [facility];  Service: Cardiovascular;;  . CARDIOVERSION N/A 05/13/2018   Procedure: CARDIOVERSION;  Surgeon: [name], MD;  Location: [facility] ENDOSCOPY;  Service: Cardiovascular;  Laterality: N/A;  . CARDIOVERSION N/A 06/13/2018   Procedure: CARDIOVERSION;  Surgeon: [name], MD;  Location: [facility] ENDOSCOPY;  Service: Cardiovascular;  Laterality: N/A;  . CARDIOVERSION N/A 06/23/2018   Procedure: CARDIOVERSION;  Surgeon: [name], MD;  Location: [facility] ENDOSCOPY;  Service: Cardiovascular;  Laterality: N/A;  . CARDIOVERSION N/A 03/11/2020   Procedure: CARDIOVERSION;  Surgeon: [name], MD;  Location: [facility];  Service: Cardiovascular;  Laterality: N/A;  . excision of actinic keratosis    . EYE SURGERY     eye lift  . IR CT HEAD LTD  01/27/2020  . IR PERCUTANEOUS ART THROMBECTOMY/INFUSION INTRACRANIAL INC DIAG ANGIO  01/27/2020      . IR PERCUTANEOUS ART  THROMBECTOMY/INFUSION INTRACRANIAL INC DIAG ANGIO  01/27/2020  . RADIOLOGY WITH ANESTHESIA N/A 01/27/2020   Procedure: IR WITH ANESTHESIA;  Surgeon: Radiologist, Medication, MD;  Location: [facility];  Service: Radiology;  Laterality: N/A;  . TEE WITHOUT CARDIOVERSION N/A 03/11/2020   Procedure: TRANSESOPHAGEAL ECHOCARDIOGRAM (TEE);  Surgeon: [name], MD;  Location: [facility];  Service: Cardiovascular;  Laterality: N/A;  . TONSILLECTOMY    . WISDOM TOOTH EXTRACTION      There were no vitals filed for this visit.   Subjective Assessment - 09/05/20 0856    Subjective "I am good"    Currently in Pain? No/denies    Pain Score 0-No pain                 ADULT SLP TREATMENT - 09/05/20 0903      General Information   Behavior/Cognition Alert;Cooperative;Pleasant mood      Treatment Provided   Treatment provided Cognitive-Linquistic      Cognitive-Linquistic Treatment   Treatment focused on Aphasia;Apraxia;Patient/family/caregiver education    Skilled Treatment Targeted multimodal conversation with usual min cues to use writing to augment speech with communication. Written approximations successful in communicating word. Dustin Schneider spontaneously used gesture x2 to augment verbal expression Writen expression targeted at 2-3 word phrases with usual min to mod A for error correction (written and verbal cues).      Assessment / Recommendations / Plan   Plan Continue with current plan of care      Progression  Toward Goals   Progression toward goals Progressing toward goals              SLP Short Term Goals - 09/05/20 0929      SLP SHORT TERM GOAL #1   Title Pt will name family, friends and art items with occasional min A 8/10 with approximations allowed.    Time 1    Period Weeks    Status Partially Met      SLP SHORT TERM GOAL #2   Title Pt will name 7 items in personally relevant categories with occasional min A over 2 sessions    Time 1    Period Weeks    Status Partially Met       SLP SHORT TERM GOAL #3   Title Pt will perform 4 automatic speech tasks with rare min A.    Time 1    Period Weeks    Status Partially Met      SLP SHORT TERM GOAL #4   Title Pt will write personal information (name/address/phone), 5 family/friend/pet names and 7  professional words with occasional min A    Time 1    Period Weeks    Status Not Met      SLP SHORT TERM GOAL #5   Title Pt will use mulitmodal communication (gesture, draw, write 1st letter etc) to augment verbal expression to meet needs at home with rare min A from family.    Time 1    Period Weeks    Status Partially Met            SLP Long Term Goals - 09/05/20 0929      SLP LONG TERM GOAL #1   Title Pt will verbalize 3 words to explain or describe his artwork and hobbies with occasional min A over 3 sessions, allowing for intellgible approximations    Baseline 05-17-20 (Lingraphica cues), 05-24-20 (lingraphica), 05-31-20 (lingraphica); 07/25/20 (written word)    Status Achieved      SLP LONG TERM GOAL #2   Title Pt and family will utilize multimodal compensations for aphasia and verbal apraxia to participate in 3 turns each of conversation with occasional min A over 2 sessions    Baseline 07/25/20;    Time 1    Period Weeks    Status Achieved      SLP LONG TERM GOAL #3   Title Pt will write 3 word phrases with less than 2 errors with rare min A over 3 sessions    Status Not Met      SLP LONG TERM GOAL #4   Title Reading assessment if indicated    Status Deferred      SLP LONG TERM GOAL #5   Title Dustin Schneider will use multimodal communication spontaneously  (writing, gesture, device) to augment verbal expression as needed 3/5 times with rare min A    Time 3    Period Weeks    Status On-going      SLP LONG TERM GOAL #6   Title Dustin Schneider will write (approximate) 4 items in personally relevant category that is understood by family/friends    Baseline 08/22/20; 08/31/20; 09/06/19    Time 4    Period Weeks     Status Achieved      SLP LONG TERM GOAL #7   Title Pt will employ multimodal communication to participate in 6 turns in conversation with extended time and occasional min A    Time 5    Period Weeks  Status On-going            Plan - 09/05/20 0927    Clinical Impression Statement Improving moderate Broca's aphasia and severe verbal apraxia persist, affecting communication of basic wants, needs and social interactions.Dustin Schneider is highly motivated and 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 His utterance length in improving and sentences with accurate syntax are emerging with extended time and significant halting. Ongoing training in multimodal communication - . Continue skilled ST to maximize communication for QOL, safety and reduce family burden.    Speech Therapy Frequency 2x / week    Duration --   20 weeks or 40 visits   Treatment/Interventions Environmental controls;Cueing hierarchy;SLP instruction and feedback;Compensatory strategies;Functional tasks;Cognitive reorganization;Compensatory techniques;Internal/external aids;Multimodal communcation approach;Patient/family education;Language facilitation    Potential to Achieve Goals Good    Potential Considerations Severity of impairments           Patient will benefit from skilled therapeutic intervention in order to improve the following deficits and impairments:   Aphasia  Verbal apraxia    Problem List Patient Active Problem List   Diagnosis Date Noted  . Benign essential HTN 06/09/2020  . Abnormality of gait 04/28/2020  . Neurogenic bladder 03/17/2020  . Sepsis due to pneumonia ([place]) 03/06/2020  . History of stroke with residual deficit 03/06/2020  . Transient hypotension 03/06/2020  . Spastic hemiplegia affecting nondominant side ([place])   . History of hypertension   . Expressive aphasia   . AKI (acute kidney injury) ([place])   . Global aphasia   . Hypoalbuminemia due to  protein-calorie malnutrition ([place])   . Dysphagia, post-stroke   . Hyperlipidemia 02/02/2020  . Dysphagia following cerebral infarction 02/02/2020  . Aspiration pneumonia ([place]) 02/02/2020  . Acute ischemic left MCA stroke ([place]) 02/02/2020  . Acute respiratory failure ([place])   . Acute ischemic stroke [facility]) s/p clot retrieval L MCA & ACA A3 01/27/2020  . Aspiration into airway 01/27/2020  . Chronic anticoagulation 01/27/2020  . Paroxysmal atrial fibrillation ([place]) 06/15/2018  . Essential hypertension 06/15/2018  . NICM (nonischemic cardiomyopathy) ([place]) 06/15/2018  . Visit for monitoring Tikosyn therapy 06/12/2018    Kati Riggenbach, Annye Rusk MS, CCC-SLP 09/05/2020, 9:32 AM  [facility] [address] [place], [place], [address] Phone: [phone]   Fax:  [phone]   Name: Dustin Schneider MRN: [id number] Date of Birth: 10/25/1955

## 2020-09-05 NOTE — Therapy (Signed)
[facility] [address] [place], [place], [address] Phone: [phone]   Fax:  [phone]  Physical Therapy Treatment  Patient Details  Name: Dustin Schneider MRN: [id number] Date of Birth: 25-Jan-1956 Referring Provider (PT): Lauraine Rinne, PA-C but followed by Delice Lesch   Encounter Date: 09/05/2020   PT End of Session - 09/05/20 1210    Visit Number [id number]    Number of Visits 37    Date for PT Re-Evaluation 10/25/20    Authorization Type BCBS    PT Start Time 0933    PT Stop Time 1016    PT Time Calculation (min) 43 min    Equipment Utilized During Treatment Gait belt    Activity Tolerance Patient tolerated treatment well    Behavior During Therapy [facility] for tasks assessed/performed           Past Medical History:  Diagnosis Date  . Hypertension   . NICM (nonischemic cardiomyopathy) ([place]) 06/15/2018   04/23/18: EF 45%, 09/15/18: NOrmal LVEF  . Paroxysmal atrial fibrillation ([place]) 06/15/2018  . Stroke ([place])   . Visit for monitoring Tikosyn therapy 06/12/2018    Past Surgical History:  Procedure Laterality Date  . BUBBLE STUDY  03/11/2020   Procedure: BUBBLE STUDY;  Surgeon: [name], MD;  Location: [place];  Service: Cardiovascular;;  . CARDIOVERSION N/A 05/13/2018   Procedure: CARDIOVERSION;  Surgeon: [name], MD;  Location: [facility] ENDOSCOPY;  Service: Cardiovascular;  Laterality: N/A;  . CARDIOVERSION N/A 06/13/2018   Procedure: CARDIOVERSION;  Surgeon: [name], MD;  Location: [facility] ENDOSCOPY;  Service: Cardiovascular;  Laterality: N/A;  . CARDIOVERSION N/A 06/23/2018   Procedure: CARDIOVERSION;  Surgeon: [name], MD;  Location: [facility] ENDOSCOPY;  Service: Cardiovascular;  Laterality: N/A;  . CARDIOVERSION N/A 03/11/2020   Procedure: CARDIOVERSION;  Surgeon: [name], MD;  Location: [facility];  Service: Cardiovascular;  Laterality: N/A;  . excision of actinic keratosis    . EYE SURGERY     eye  lift  . IR CT HEAD LTD  01/27/2020  . IR PERCUTANEOUS ART THROMBECTOMY/INFUSION INTRACRANIAL INC DIAG ANGIO  01/27/2020      . IR PERCUTANEOUS ART THROMBECTOMY/INFUSION INTRACRANIAL INC DIAG ANGIO  01/27/2020  . RADIOLOGY WITH ANESTHESIA N/A 01/27/2020   Procedure: IR WITH ANESTHESIA;  Surgeon: Radiologist, Medication, MD;  Location: [place];  Service: Radiology;  Laterality: N/A;  . TEE WITHOUT CARDIOVERSION N/A 03/11/2020   Procedure: TRANSESOPHAGEAL ECHOCARDIOGRAM (TEE);  Surgeon: [name], MD;  Location: [place];  Service: Cardiovascular;  Laterality: N/A;  . TONSILLECTOMY    . WISDOM TOOTH EXTRACTION      There were no vitals filed for this visit.   Subjective Assessment - 09/05/20 0935    Subjective No changes since he was last here. Has been trying the bridges with the ball at home.    Pertinent History PMH: HTN, paroxysmal a fib    Limitations Standing;Walking    Patient Stated Goals "wants everything working on R side again"    Currently in Pain? No/denies    Pain Onset 1 to 4 weeks ago                             [facility] Adult PT Treatment/Exercise - 09/05/20 0949      Ambulation/Gait   Ambulation/Gait Yes    Ambulation/Gait Assistance 5: Supervision;4: Min guard;4: Min assist    Ambulation/Gait Assistance Details supervision using RW with  cues for proper RLE foot placement at times into and out of session, additional distances performed with SPC with quad cane throughout session with min guard/min A, cues for posture and intermittent assist for RLE foot clearance    Ambulation Distance (Feet) 230 Feet   x1, 115 x 1 (all with SPC with quad tip)   Assistive device Rolling walker;Straight cane   R AFO, SPC with quad tip   Gait Pattern Step-through pattern;Decreased hip/knee flexion - right;Poor foot clearance - right    Ambulation Surface Indoor;Level    Curb 4: Min assist    Curb Details (indicate cue type and reason) modified curb training on 4" aerobic  step: first performing cane and stepping up L foot and then back to midline x6 reps, then performing with single UE support x10 reps LLE leading step ups to prep for curb training, then performed curb step up with use of SPC with quad tip, initial cues for sequencing and min A for balance x5 reps, during last 2 reps pt needing min A to help clear RLE. performed x1 rep descending with cues for sequencing and min A      Knee/Hip Exercises: Stretches   Other Knee/Hip Stretches pt supine therapist performing R hip IR stretch, 5 x 30 seconds      Knee/Hip Exercises: Supine   Hip Adduction Isometric Strengthening;10 reps    Hip Adduction Isometric Limitations in hooklying position bent knee fall outs with RLE and therapist providing resistance for pt bringing leg back to midline for hip ADD x10 reps, performed x10 reps hip ADD ball squeeze with focus on squeezing with RLE first               Balance Exercises - 09/05/20 0001      Balance Exercises: Standing   SLS with Vectors Solid surface;Limitations    SLS with Vectors Limitations with single LUE support in / bars: alternating foot taps to 4" step (tapping black box) x10 reps alternating with intermittent assist for RLE and assist to help prevent circumduction to bring RLE to tap, then RLE tapping box and then towards middle of box for hip ADD x5 reps    Rockerboard Lateral;Head turns;EO;EC    Rockerboard Limitations keeping board steady 2 x 30 seconds, then with head turns R/L x10 reps with intermittent touch to bars for balance, then with eyes closed 2 x 20 seconds intermittent UE support and min guard/min A               PT Short Term Goals - 08/26/20 9147      PT SHORT TERM GOAL #1   Title Pt will decrease TUG from 21 sec to <18 sec for improved balance and functional mobility.    Baseline 08/26/20 21 sec with RW    Time 4    Period Weeks    Status New    Target Date 09/25/20      PT SHORT TERM GOAL #2   Title Pt will  ambulate >800' on varied surfaces with RW/right hand grip attachment and AFO supervision for improved short community distances.    Baseline 600' CGA with walker    Time 4    Period Weeks    Status New    Target Date 09/25/20             PT Long Term Goals - 08/26/20 1000      PT LONG TERM GOAL #1   Title Pt and pt's family members will  be independent with final HEP in order to build upon functional gains made in therapy. ALL LTGS DUE3/15/22    Baseline PT continues to update HEP as pt is progressing. Most recent update 08/26/20    Time 8    Period Weeks    Status On-going    Target Date 10/25/20      PT LONG TERM GOAL #2   Title Pt will ambulate 250' with cane and right AFO CGA for improved household mobility on level surfaces.    Time 8    Period Weeks    Status New    Target Date 10/25/20      PT LONG TERM GOAL #3   Title Pt will increase Berg from 38 to >42/56 for improved balance and decreased fall risk.    Baseline 08/26/20 38/56    Time 8    Period Weeks    Status New    Target Date 10/25/20      PT LONG TERM GOAL #4   Title Pt will ambulate up/down 2 steps with LRAD supervision for improved access in home to living room    Baseline CGA with RW    Time 8    Period Weeks    Status New    Target Date 10/25/20      PT LONG TERM GOAL #5   Title Pt will increase gait speed from 0.28m/s to >0.66m/s for improved community mobility.    Baseline 08/26/20 0.37m/s    Time 8    Period Weeks    Status New    Target Date 10/25/20                 Plan - 09/05/20 1212    Clinical Impression Statement Able to progress gait distances today with SPC with quad tip with pt needing assist at times for improvd RLE foot clearance. Cues for posture throughout. Also trialed a modified curb (4" aerobic step) with SPC with quad tip - pt needing cues for sequencing and assist during reps 4-5 to help clear RLE as it got stuck on the step. Will continue to progress towards LTGs.     Personal Factors and Comorbidities Comorbidity 3+    Comorbidities dense L MCA CVA, paroxysmal A. fib on chronic Xarelto, nonischemic cardiomyopathy and hypertension.    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, works as an Electrical engineer Evolving/Moderate complexity    Rehab Potential Good    PT Frequency 2x / week    PT Duration 8 weeks    PT Treatment/Interventions ADLs/Self Care Home Management;Aquatic Therapy;DME Instruction;Gait training;Stair training;Functional mobility training;Therapeutic activities;Therapeutic exercise;Electrical Stimulation;Balance training;Neuromuscular re-education;Wheelchair mobility training;Orthotic Fit/Training;Patient/family education;Passive range of motion;Energy conservation    PT Next Visit Plan Continue gait training with RW and with cane with quad tip (when safe beginning family training for SMALL distances at home and info on where to purchase), Try to get more hip adductor activation. RLE foot clearance. stair training, obstacle negotiation, gait outdoors and over unlevel surfaces, RLE NMR with decr UE support, balance on compliant surfaces.    PT Home Exercise Plan on 06/28/20 - added standing at countetop and stepping LLE out and in    Consulted and Agree with Plan of Care Patient;Family member/caregiver    Family Member Consulted [name]           Patient will benefit from skilled therapeutic intervention in order to improve  the following deficits and impairments:  Abnormal gait,Decreased activity tolerance,Decreased balance,Decreased cognition,Decreased mobility,Decreased coordination,Decreased range of motion,Decreased endurance,Decreased strength,Hypomobility,Difficulty walking,Impaired UE functional use,Impaired  vision/preception,Postural dysfunction,Impaired sensation  Visit Diagnosis: Other abnormalities of gait and mobility  Muscle weakness (generalized)  Hemiplegia and hemiparesis following cerebral infarction affecting right dominant side (HCC)  Unsteadiness on feet  Difficulty in walking, not elsewhere classified     Problem List Patient Active Problem List   Diagnosis Date Noted  . Benign essential HTN 06/09/2020  . Abnormality of gait 04/28/2020  . Neurogenic bladder 03/17/2020  . Sepsis due to pneumonia ([place]) 03/06/2020  . History of stroke with residual deficit 03/06/2020  . Transient hypotension 03/06/2020  . Spastic hemiplegia affecting nondominant side ([place])   . History of hypertension   . Expressive aphasia   . AKI (acute kidney injury) ([place])   . Global aphasia   . Hypoalbuminemia due to protein-calorie malnutrition ([place])   . Dysphagia, post-stroke   . Hyperlipidemia 02/02/2020  . Dysphagia following cerebral infarction 02/02/2020  . Aspiration pneumonia ([place]) 02/02/2020  . Acute ischemic left MCA stroke ([place]) 02/02/2020  . Acute respiratory failure ([place])   . Acute ischemic stroke [facility]) s/p clot retrieval L MCA & ACA A3 01/27/2020  . Aspiration into airway 01/27/2020  . Chronic anticoagulation 01/27/2020  . Paroxysmal atrial fibrillation ([place]) 06/15/2018  . Essential hypertension 06/15/2018  . NICM (nonischemic cardiomyopathy) ([place]) 06/15/2018  . Visit for monitoring Tikosyn therapy 06/12/2018    Arliss Journey, PT, DPT 09/05/2020, 12:14 PM  [facility] [address] [place], [place], [address] Phone: [phone]   Fax:  [phone]  Name: JAKARIE PEMBER MRN: [id number] Date of Birth: 12/27/1955

## 2020-09-08 ENCOUNTER — Encounter: Payer: Self-pay | Admitting: Physical Therapy

## 2020-09-08 ENCOUNTER — Ambulatory Visit: Payer: BC Managed Care – PPO | Admitting: Physical Therapy

## 2020-09-08 ENCOUNTER — Other Ambulatory Visit: Payer: Self-pay

## 2020-09-08 ENCOUNTER — Ambulatory Visit: Payer: BC Managed Care – PPO

## 2020-09-08 DIAGNOSIS — I69351 Hemiplegia and hemiparesis following cerebral infarction affecting right dominant side: Secondary | ICD-10-CM | POA: Diagnosis not present

## 2020-09-08 DIAGNOSIS — R4701 Aphasia: Secondary | ICD-10-CM

## 2020-09-08 DIAGNOSIS — R2681 Unsteadiness on feet: Secondary | ICD-10-CM

## 2020-09-08 DIAGNOSIS — R2689 Other abnormalities of gait and mobility: Secondary | ICD-10-CM

## 2020-09-08 DIAGNOSIS — M6281 Muscle weakness (generalized): Secondary | ICD-10-CM

## 2020-09-08 NOTE — Therapy (Signed)
[facility] [address] [place], [place], [address] Phone: [phone]   Fax:  [phone]  Physical Therapy Treatment  Patient Details  Name: Dustin Schneider MRN: [id number] Date of Birth: 10-11-1955 Referring Provider (PT): Lauraine Rinne, PA-C but followed by Delice Lesch   Encounter Date: 09/08/2020   PT End of Session - 09/08/20 1521    Visit Number 33    Number of Visits 12    Date for PT Re-Evaluation 10/25/20    Authorization Type BCBS    PT Start Time 0929    PT Stop Time 1012    PT Time Calculation (min) 43 min    Equipment Utilized During Treatment Gait belt    Activity Tolerance Patient tolerated treatment well    Behavior During Therapy [facility] for tasks assessed/performed           Past Medical History:  Diagnosis Date  . Hypertension   . NICM (nonischemic cardiomyopathy) ([place]) 06/15/2018   04/23/18: EF 45%, 09/15/18: NOrmal LVEF  . Paroxysmal atrial fibrillation ([place]) 06/15/2018  . Stroke ([place])   . Visit for monitoring Tikosyn therapy 06/12/2018    Past Surgical History:  Procedure Laterality Date  . BUBBLE STUDY  03/11/2020   Procedure: BUBBLE STUDY;  Surgeon: [name], MD;  Location: [place];  Service: Cardiovascular;;  . CARDIOVERSION N/A 05/13/2018   Procedure: CARDIOVERSION;  Surgeon: [name], MD;  Location: [facility] ENDOSCOPY;  Service: Cardiovascular;  Laterality: N/A;  . CARDIOVERSION N/A 06/13/2018   Procedure: CARDIOVERSION;  Surgeon: [name], MD;  Location: [facility] ENDOSCOPY;  Service: Cardiovascular;  Laterality: N/A;  . CARDIOVERSION N/A 06/23/2018   Procedure: CARDIOVERSION;  Surgeon: [name], MD;  Location: [facility] ENDOSCOPY;  Service: Cardiovascular;  Laterality: N/A;  . CARDIOVERSION N/A 03/11/2020   Procedure: CARDIOVERSION;  Surgeon: [name], MD;  Location: [facility];  Service: Cardiovascular;  Laterality: N/A;  . excision of actinic keratosis    . EYE SURGERY     eye  lift  . IR CT HEAD LTD  01/27/2020  . IR PERCUTANEOUS ART THROMBECTOMY/INFUSION INTRACRANIAL INC DIAG ANGIO  01/27/2020      . IR PERCUTANEOUS ART THROMBECTOMY/INFUSION INTRACRANIAL INC DIAG ANGIO  01/27/2020  . RADIOLOGY WITH ANESTHESIA N/A 01/27/2020   Procedure: IR WITH ANESTHESIA;  Surgeon: Radiologist, Medication, MD;  Location: [facility];  Service: Radiology;  Laterality: N/A;  . TEE WITHOUT CARDIOVERSION N/A 03/11/2020   Procedure: TRANSESOPHAGEAL ECHOCARDIOGRAM (TEE);  Surgeon: [name], MD;  Location: [place] [facility];  Service: Cardiovascular;  Laterality: N/A;  . TONSILLECTOMY    . WISDOM TOOTH EXTRACTION      There were no vitals filed for this visit.   Subjective Assessment - 09/08/20 0931    Subjective No falls. Nothing new.    Pertinent History PMH: HTN, paroxysmal a fib    Limitations Standing;Walking    Patient Stated Goals "wants everything working on R side again"    Currently in Pain? No/denies    Pain Onset 1 to 4 weeks ago                             [facility] Adult PT Treatment/Exercise - 09/08/20 0001      Ambulation/Gait   Ambulation/Gait Yes    Ambulation/Gait Assistance 5: Supervision;4: Min guard;4: Min assist    Ambulation/Gait Assistance Details into and out of session with RW and supervision. performed 2 laps using SPC with quad tip (  min guard) but with pt needing min A at 2 instances during end of last lap to help clear RLE due to fatigue (performed after stair training), otherwise min guard throughout remainder of gait with SPC with quad tip between activities    Ambulation Distance (Feet) 230 Feet    Assistive device Rolling walker;Straight cane   with 4 prong tip   Gait Pattern Step-through pattern;Decreased hip/knee flexion - right;Poor foot clearance - right    Ambulation Surface Level;Indoor    Stairs Yes    Stairs Assistance 4: Min guard;4: Min assist    Stairs Assistance Details (indicate cue type and reason) one rep with step to  pattern, remaining 3 reps with step through pattern with pt needing min A to help clear RLE initially on lip of 1st step, cues for slowed and controlled while descending, no LOB    Stair Management Technique One rail Left;Step to pattern;Alternating pattern    Number of Stairs 16    Height of Stairs 4      Neuro Re-ed    Neuro Re-ed Details  in // bars: lateral step overs with LLE over 2"  beam x10 reps with fingertip support for incr SLS/stance time on RLE, then performed 2 x 5 rep swith no UE support with min A, cues for R quad/glute activation and slowed and controlled. modified SLS with R foot on floor and LLE on 4" step - performed balance with eyes closed x10 reps head turns, x10 reps head nods with min A for balance, then performing with 1" foam under RLE with LLE still on 4" step for incr weight shift to RLE, tapping with L hand over to R to tap therapist's hand as a target (multi directional) x10 reps, then performing eyes closed 3 x 20 seconds on 1" foam with min A and cues for R knee extension/posture. standing on blue air ex: x10 reps marching with UE suppot with LLE, marching with LLE and fingertip support for additional x5 reps                  PT Education - 09/08/20 1518    Education Details gave [name] information where to purchase a SPC and quad tip for small household distances only for pt when pt is safe to do so- NOT YET CLEARED FOR GAIT WITH CANE AT HOME, but plan to start family training next week.    Person(s) Educated Associate Professor)   brother in Sports coach [place]   Methods Explanation    Comprehension Verbalized understanding            PT Short Term Goals - 08/26/20 0958      PT SHORT TERM GOAL #1   Title Pt will decrease TUG from 21 sec to <18 sec for improved balance and functional mobility.    Baseline 08/26/20 21 sec with RW    Time 4    Period Weeks    Status New    Target Date 09/25/20      PT SHORT TERM GOAL #2   Title Pt will ambulate >800' on  varied surfaces with RW/right hand grip attachment and AFO supervision for improved short community distances.    Baseline 600' CGA with walker    Time 4    Period Weeks    Status New    Target Date 09/25/20             PT Long Term Goals - 08/26/20 1000      PT LONG  TERM GOAL #1   Title Pt and pt's family members will be independent with final HEP in order to build upon functional gains made in therapy. ALL LTGS DUE3/15/22    Baseline PT continues to update HEP as pt is progressing. Most recent update 08/26/20    Time 8    Period Weeks    Status On-going    Target Date 10/25/20      PT LONG TERM GOAL #2   Title Pt will ambulate 250' with cane and right AFO CGA for improved household mobility on level surfaces.    Time 8    Period Weeks    Status New    Target Date 10/25/20      PT LONG TERM GOAL #3   Title Pt will increase Berg from 38 to >42/56 for improved balance and decreased fall risk.    Baseline 08/26/20 38/56    Time 8    Period Weeks    Status New    Target Date 10/25/20      PT LONG TERM GOAL #4   Title Pt will ambulate up/down 2 steps with LRAD supervision for improved access in home to living room    Baseline CGA with RW    Time 8    Period Weeks    Status New    Target Date 10/25/20      PT LONG TERM GOAL #5   Title Pt will increase gait speed from 0.19m/s to >0.91m/s for improved community mobility.    Baseline 08/26/20 0.14m/s    Time 8    Period Weeks    Status New    Target Date 10/25/20                 Plan - 09/08/20 1531    Clinical Impression Statement Performed stair training today with alternating pattern with ascending and descending with single handrail with pt needing primarily min guard, but does need assist to help clear RLE from lip of step on 1st step. After stairs performed 2 laps of gait with SPC with quad tip with pt needing a brief instance of min A towards end of bout for RLE foot clearance due to fatigue. Will continue to  progress towards LTGs.    Personal Factors and Comorbidities Comorbidity 3+    Comorbidities dense L MCA CVA, paroxysmal A. fib on chronic Xarelto, nonischemic cardiomyopathy and hypertension.    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, works as an Electrical engineer Evolving/Moderate complexity    Rehab Potential Good    PT Frequency 2x / week    PT Duration 8 weeks    PT Treatment/Interventions ADLs/Self Care Home Management;Aquatic Therapy;DME Instruction;Gait training;Stair training;Functional mobility training;Therapeutic activities;Therapeutic exercise;Electrical Stimulation;Balance training;Neuromuscular re-education;Wheelchair mobility training;Orthotic Fit/Training;Patient/family education;Passive range of motion;Energy conservation    PT Next Visit Plan Continue gait training with RW and with cane with quad tip (when safe beginning family training for SMALL distances at home), Try to get more hip adductor activation. RLE foot clearance. stair training, obstacle negotiation, gait outdoors and over unlevel surfaces, RLE NMR with decr UE support, balance on compliant surfaces.    PT Home Exercise Plan on 06/28/20 - added standing at countetop and stepping LLE out and in    Consulted and Agree with Plan of Care Patient;Family member/caregiver    Family Member Consulted [name]  Patient will benefit from skilled therapeutic intervention in order to improve the following deficits and impairments:  Abnormal gait,Decreased activity tolerance,Decreased balance,Decreased cognition,Decreased mobility,Decreased coordination,Decreased range of motion,Decreased endurance,Decreased strength,Hypomobility,Difficulty walking,Impaired UE functional use,Impaired  vision/preception,Postural dysfunction,Impaired sensation  Visit Diagnosis: Other abnormalities of gait and mobility  Muscle weakness (generalized)  Hemiplegia and hemiparesis following cerebral infarction affecting right dominant side ([place])  Unsteadiness on feet     Problem List Patient Active Problem List   Diagnosis Date Noted  . Benign essential HTN 06/09/2020  . Abnormality of gait 04/28/2020  . Neurogenic bladder 03/17/2020  . Sepsis due to pneumonia ([place]) 03/06/2020  . History of stroke with residual deficit 03/06/2020  . Transient hypotension 03/06/2020  . Spastic hemiplegia affecting nondominant side ([place])   . History of hypertension   . Expressive aphasia   . AKI (acute kidney injury) ([place])   . Global aphasia   . Hypoalbuminemia due to protein-calorie malnutrition ([place])   . Dysphagia, post-stroke   . Hyperlipidemia 02/02/2020  . Dysphagia following cerebral infarction 02/02/2020  . Aspiration pneumonia ([place]) 02/02/2020  . Acute ischemic left MCA stroke ([place]) 02/02/2020  . Acute respiratory failure ([place])   . Acute ischemic stroke [facility]) s/p clot retrieval L MCA & ACA A3 01/27/2020  . Aspiration into airway 01/27/2020  . Chronic anticoagulation 01/27/2020  . Paroxysmal atrial fibrillation ([place]) 06/15/2018  . Essential hypertension 06/15/2018  . NICM (nonischemic cardiomyopathy) ([place]) 06/15/2018  . Visit for monitoring Tikosyn therapy 06/12/2018    Arliss Journey, PT, DPT  09/08/2020, 3:33 PM  [facility] [address] [place], [place], [address] Phone: [phone]   Fax:  [phone]  Name: Dustin Schneider MRN: [id number] Date of Birth: 1956-07-27

## 2020-09-08 NOTE — Therapy (Signed)
[facility] [address] [place], [place], [address] Phone: [phone]   Fax:  [phone]  Speech Language Pathology Treatment  Patient Details  Name: Dustin Schneider MRN: [id number] Date of Birth: 09-22-1955 Referring Provider (SLP): Dr. Delice Lesch   Encounter Date: 09/08/2020   End of Session - 09/08/20 0933    Visit Number 24    Number of Visits 40    Date for SLP Re-Evaluation 09/19/20    SLP Start Time 0847    SLP Stop Time  0930    SLP Time Calculation (min) 43 min    Activity Tolerance Patient tolerated treatment well           Past Medical History:  Diagnosis Date  . Hypertension   . NICM (nonischemic cardiomyopathy) ([place]) 06/15/2018   04/23/18: EF 45%, 09/15/18: NOrmal LVEF  . Paroxysmal atrial fibrillation ([place]) 06/15/2018  . Stroke ([place])   . Visit for monitoring Tikosyn therapy 06/12/2018    Past Surgical History:  Procedure Laterality Date  . BUBBLE STUDY  03/11/2020   Procedure: BUBBLE STUDY;  Surgeon: [name], MD;  Location: [place];  Service: Cardiovascular;;  . CARDIOVERSION N/A 05/13/2018   Procedure: CARDIOVERSION;  Surgeon: [name], MD;  Location: [facility] ENDOSCOPY;  Service: Cardiovascular;  Laterality: N/A;  . CARDIOVERSION N/A 06/13/2018   Procedure: CARDIOVERSION;  Surgeon: [name], MD;  Location: [facility] ENDOSCOPY;  Service: Cardiovascular;  Laterality: N/A;  . CARDIOVERSION N/A 06/23/2018   Procedure: CARDIOVERSION;  Surgeon: [name], MD;  Location[facility] ENDOSCOPY;  Service: Cardiovascular;  Laterality: N/A;  . CARDIOVERSION N/A 03/11/2020   Procedure: CARDIOVERSION;  Surgeon: [name], MD;  Location: [place];  Service: Cardiovascular;  Laterality: N/A;  . excision of actinic keratosis    . EYE SURGERY     eye lift  . IR CT HEAD LTD  01/27/2020  . IR PERCUTANEOUS ART THROMBECTOMY/INFUSION INTRACRANIAL INC DIAG ANGIO  01/27/2020      . IR PERCUTANEOUS ART  THROMBECTOMY/INFUSION INTRACRANIAL INC DIAG ANGIO  01/27/2020  . RADIOLOGY WITH ANESTHESIA N/A 01/27/2020   Procedure: IR WITH ANESTHESIA;  Surgeon: Radiologist, Medication, MD;  Location: [place];  Service: Radiology;  Laterality: N/A;  . TEE WITHOUT CARDIOVERSION N/A 03/11/2020   Procedure: TRANSESOPHAGEAL ECHOCARDIOGRAM (TEE);  Surgeon: [name], MD;  Location: [place];  Service: Cardiovascular;  Laterality: N/A;  . TONSILLECTOMY    . WISDOM TOOTH EXTRACTION      There were no vitals filed for this visit.   Subjective Assessment - 09/08/20 0919    Subjective I am good.    Patient is accompained by: Family member   Brother in Sports coach - Dustin Schneider   Currently in Pain? No/denies                 ADULT SLP TREATMENT - 09/08/20 0001      General Information   Behavior/Cognition Cooperative;Pleasant mood      Treatment Provided   Treatment provided Cognitive-Linquistic      Cognitive-Linquistic Treatment   Treatment focused on Aphasia;Patient/family/caregiver education    Skilled Treatment Targeted multimodal conversation with usual min cues to use writing to augment speech with communication. Written approximations successful in communicating word. Response Elaboration Training (RET) was utilized this session to increase length of utterance. Pt was successful in providing content words; however, required assistance to incorporate functors. Sentence was written and pt was able to read aloud fluently.      Assessment / Recommendations /  Plan   Plan Continue with current plan of care      Progression Toward Goals   Progression toward goals Progressing toward goals              SLP Short Term Goals - 09/08/20 0935      SLP SHORT TERM GOAL #1   Title Pt will name family, friends and art items with occasional min A 8/10 with approximations allowed.    Time 1    Period Weeks    Status Partially Met      SLP SHORT TERM GOAL #2   Title Pt will name 7 items in personally  relevant categories with occasional min A over 2 sessions    Time 1    Period Weeks    Status Partially Met      SLP SHORT TERM GOAL #3   Title Pt will perform 4 automatic speech tasks with rare min A.    Time 1    Period Weeks    Status Partially Met      SLP SHORT TERM GOAL #4   Title Pt will write personal information (name/address/phone), 5 family/friend/pet names and 7  professional words with occasional min A    Time 1    Period Weeks    Status Not Met      SLP SHORT TERM GOAL #5   Title Pt will use mulitmodal communication (gesture, draw, write 1st letter etc) to augment verbal expression to meet needs at home with rare min A from family.    Time 1    Period Weeks    Status Partially Met            SLP Long Term Goals - 09/08/20 0936      SLP LONG TERM GOAL #1   Title Pt will verbalize 3 words to explain or describe his artwork and hobbies with occasional min A over 3 sessions, allowing for intellgible approximations    Baseline 05-17-20 (Lingraphica cues), 05-24-20 (lingraphica), 05-31-20 (lingraphica); 07/25/20 (written word)    Status Achieved      SLP LONG TERM GOAL #2   Title Pt and family will utilize multimodal compensations for aphasia and verbal apraxia to participate in 3 turns each of conversation with occasional min A over 2 sessions    Baseline 07/25/20;    Time 1    Period Weeks    Status Achieved      SLP LONG TERM GOAL #3   Title Pt will write 3 word phrases with less than 2 errors with rare min A over 3 sessions    Status Not Met      SLP LONG TERM GOAL #4   Title Reading assessment if indicated    Status Deferred      SLP LONG TERM GOAL #5   Title [name] will use multimodal communication spontaneously  (writing, gesture, device) to augment verbal expression as needed 3/5 times with rare min A    Time 3    Period Weeks    Status On-going      SLP LONG TERM GOAL #6   Title [name] will write (approximate) 4 items in personally relevant category  that is understood by family/friends    Baseline 08/22/20; 08/31/20; 09/06/19    Time 4    Period Weeks    Status Achieved      SLP LONG TERM GOAL #7   Title Pt will employ multimodal communication to participate in 6 turns in conversation with extended  time and occasional min A    Time 5    Period Weeks    Status On-going            Plan - 09/08/20 0934    Clinical Impression Statement Improving moderate Broca's aphasia and severe verbal apraxia persist, affecting communication of basic wants, needs and social interactions.[name] is highly motivated and 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 His utterance length in improving and sentences with accurate syntax are emerging with extended time and significant halting. Ongoing training in multimodal communication - . Continue skilled ST to maximize communication for QOL, safety and reduce family burden.    Speech Therapy Frequency 2x / week    Duration --   20 weeks or 40 visits   Treatment/Interventions Environmental controls;Cueing hierarchy;SLP instruction and feedback;Compensatory strategies;Functional tasks;Cognitive reorganization;Compensatory techniques;Internal/external aids;Multimodal communcation approach;Patient/family education;Language facilitation    Potential to Achieve Goals Good    Potential Considerations Severity of impairments    SLP Home Exercise Plan Continue utilizing white board to augment verbal communication.    Consulted and Agree with Plan of Care Patient;Family member/caregiver    Family Member Consulted Brother in [place]           Patient will benefit from skilled therapeutic intervention in order to improve the following deficits and impairments:   Aphasia    Problem List Patient Active Problem List   Diagnosis Date Noted  . Benign essential HTN 06/09/2020  . Abnormality of gait 04/28/2020  . Neurogenic bladder 03/17/2020  . Sepsis due to  pneumonia ([place]) 03/06/2020  . History of stroke with residual deficit 03/06/2020  . Transient hypotension 03/06/2020  . Spastic hemiplegia affecting nondominant side ([place])   . History of hypertension   . Expressive aphasia   . AKI (acute kidney injury) ([place])   . Global aphasia   . Hypoalbuminemia due to protein-calorie malnutrition ([place])   . Dysphagia, post-stroke   . Hyperlipidemia 02/02/2020  . Dysphagia following cerebral infarction 02/02/2020  . Aspiration pneumonia ([place]) 02/02/2020  . Acute ischemic left MCA stroke ([place]) 02/02/2020  . Acute respiratory failure ([place])   . Acute ischemic stroke [facility]) s/p clot retrieval L MCA & ACA A3 01/27/2020  . Aspiration into airway 01/27/2020  . Chronic anticoagulation 01/27/2020  . Paroxysmal atrial fibrillation ([place]) 06/15/2018  . Essential hypertension 06/15/2018  . NICM (nonischemic cardiomyopathy) ([place]) 06/15/2018  . Visit for monitoring Tikosyn therapy 06/12/2018    [facility], [place] 09/08/2020, 9:38 AM  [facility] [address] [place], [place], [address] Phone: [phone]   Fax:  [phone]   Name: Dustin Schneider MRN: [id number] Date of Birth: 1955-10-25

## 2020-09-08 NOTE — Patient Instructions (Signed)
Utilize white board/pen+paper to assist in word finding while filming video for aphasia group.

## 2020-09-12 ENCOUNTER — Ambulatory Visit: Payer: BC Managed Care – PPO | Admitting: Speech Pathology

## 2020-09-12 ENCOUNTER — Ambulatory Visit: Payer: BC Managed Care – PPO | Admitting: Physical Therapy

## 2020-09-12 ENCOUNTER — Other Ambulatory Visit: Payer: Self-pay

## 2020-09-12 ENCOUNTER — Encounter: Payer: Self-pay | Admitting: Physical Therapy

## 2020-09-12 ENCOUNTER — Encounter: Payer: Self-pay | Admitting: Speech Pathology

## 2020-09-12 DIAGNOSIS — R482 Apraxia: Secondary | ICD-10-CM

## 2020-09-12 DIAGNOSIS — M6281 Muscle weakness (generalized): Secondary | ICD-10-CM

## 2020-09-12 DIAGNOSIS — R2689 Other abnormalities of gait and mobility: Secondary | ICD-10-CM

## 2020-09-12 DIAGNOSIS — R4701 Aphasia: Secondary | ICD-10-CM

## 2020-09-12 DIAGNOSIS — R262 Difficulty in walking, not elsewhere classified: Secondary | ICD-10-CM

## 2020-09-12 DIAGNOSIS — R2681 Unsteadiness on feet: Secondary | ICD-10-CM

## 2020-09-12 DIAGNOSIS — I69351 Hemiplegia and hemiparesis following cerebral infarction affecting right dominant side: Secondary | ICD-10-CM | POA: Diagnosis not present

## 2020-09-12 NOTE — Therapy (Signed)
[facility] [address] [place], [place], [address] Phone: [phone]   Fax:  [phone]  Speech Language Pathology Treatment  Patient Details  Name: Dustin Schneider MRN: [id number] Date of Birth: 01-Jan-1956 Referring Provider (SLP): Dr. Delice Lesch   Encounter Date: 09/12/2020   End of Session - 09/12/20 0936    Visit Number 25    Number of Visits 40    Date for SLP Re-Evaluation 09/19/20    SLP Start Time 0846    SLP Stop Time  0929    SLP Time Calculation (min) 43 min    Activity Tolerance Patient tolerated treatment well           Past Medical History:  Diagnosis Date  . Hypertension   . NICM (nonischemic cardiomyopathy) ([place]) 06/15/2018   04/23/18: EF 45%, 09/15/18: NOrmal LVEF  . Paroxysmal atrial fibrillation ([place]) 06/15/2018  . Stroke ([place])   . Visit for monitoring Tikosyn therapy 06/12/2018    Past Surgical History:  Procedure Laterality Date  . BUBBLE STUDY  03/11/2020   Procedure: BUBBLE STUDY;  Surgeon: [name], MD;  Location: [facility];  Service: Cardiovascular;;  . CARDIOVERSION N/A 05/13/2018   Procedure: CARDIOVERSION;  Surgeon: [name], MD;  Location: [facility] ENDOSCOPY;  Service: Cardiovascular;  Laterality: N/A;  . CARDIOVERSION N/A 06/13/2018   Procedure: CARDIOVERSION;  Surgeon: [name], MD;  Location: [facility] ENDOSCOPY;  Service: Cardiovascular;  Laterality: N/A;  . CARDIOVERSION N/A 06/23/2018   Procedure: CARDIOVERSION;  Surgeon: [name], MD;  Location: [facility] ENDOSCOPY;  Service: Cardiovascular;  Laterality: N/A;  . CARDIOVERSION N/A 03/11/2020   Procedure: CARDIOVERSION;  Surgeon: [name], MD;  Location: [place];  Service: Cardiovascular;  Laterality: N/A;  . excision of actinic keratosis    . EYE SURGERY     eye lift  . IR CT HEAD LTD  01/27/2020  . IR PERCUTANEOUS ART THROMBECTOMY/INFUSION INTRACRANIAL INC DIAG ANGIO  01/27/2020      . IR PERCUTANEOUS ART  THROMBECTOMY/INFUSION INTRACRANIAL INC DIAG ANGIO  01/27/2020  . RADIOLOGY WITH ANESTHESIA N/A 01/27/2020   Procedure: IR WITH ANESTHESIA;  Surgeon: Radiologist, Medication, MD;  Location: [place];  Service: Radiology;  Laterality: N/A;  . TEE WITHOUT CARDIOVERSION N/A 03/11/2020   Procedure: TRANSESOPHAGEAL ECHOCARDIOGRAM (TEE);  Surgeon: [name], MD;  Location: [place];  Service: Cardiovascular;  Laterality: N/A;  . TONSILLECTOMY    . WISDOM TOOTH EXTRACTION      There were no vitals filed for this visit.   Subjective Assessment - 09/12/20 0852    Subjective "I have an important breakfast"    Patient is accompained by: Family member   [name]   Currently in Pain? No/denies                 ADULT SLP TREATMENT - 09/12/20 0853      General Information   Behavior/Cognition Cooperative;Pleasant mood      Treatment Provided   Treatment provided Cognitive-Linquistic      Cognitive-Linquistic Treatment   Treatment focused on Aphasia;Patient/family/caregiver education    Skilled Treatment [name] is going to a breakfast at [employer] tomorrow to honor the men who sat in [place] which will include honoring his sculpture as well. We role played some questions people will ask him (social questions) and generated written cues for [name] to bring to the breakfast to assist him in generating responses. Jim improved with written cues. We also role played some profressional questions and generated written cues.  [name] will use a mini notebook with pre scripted senenteces at breakfast.      Assessment / Recommendations / [place] with current plan of care      Progression Toward Goals   Progression toward goals Progressing toward goals            SLP Education - 09/12/20 0932    Education Details use written pre-scripted sentneces to aid in responding to social questions    Person(s) Educated Patient;Caregiver(s)    Methods Explanation;Demonstration;Verbal cues;Handout     Comprehension Verbalized understanding;Returned demonstration;Verbal cues required            SLP Short Term Goals - 09/12/20 0933      SLP SHORT TERM GOAL #1   Title Pt will name family, friends and art items with occasional min A 8/10 with approximations allowed.    Time 1    Period Weeks    Status Partially Met      SLP SHORT TERM GOAL #2   Title Pt will name 7 items in personally relevant categories with occasional min A over 2 sessions    Time 1    Period Weeks    Status Partially Met      SLP SHORT TERM GOAL #3   Title Pt will perform 4 automatic speech tasks with rare min A.    Time 1    Period Weeks    Status Partially Met      SLP SHORT TERM GOAL #4   Title Pt will write personal information (name/address/phone), 5 family/friend/pet names and 7  professional words with occasional min A    Time 1    Period Weeks    Status Not Met      SLP SHORT TERM GOAL #5   Title Pt will use mulitmodal communication (gesture, draw, write 1st letter etc) to augment verbal expression to meet needs at home with rare min A from family.    Time 1    Period Weeks    Status Partially Met            SLP Long Term Goals - 09/12/20 0935      SLP LONG TERM GOAL #1   Title Pt will verbalize 3 words to explain or describe his artwork and hobbies with occasional min A over 3 sessions, allowing for intellgible approximations    Baseline 05-17-20 (Lingraphica cues), 05-24-20 (lingraphica), 05-31-20 (lingraphica); 07/25/20 (written word)    Status Achieved      SLP LONG TERM GOAL #2   Title Pt and family will utilize multimodal compensations for aphasia and verbal apraxia to participate in 3 turns each of conversation with occasional min A over 2 sessions    Baseline 07/25/20;    Time 1    Period Weeks    Status Achieved      SLP LONG TERM GOAL #3   Title Pt will write 3 word phrases with less than 2 errors with rare min A over 3 sessions    Status Not Met      SLP LONG TERM GOAL  #4   Title Reading assessment if indicated    Status Deferred      SLP LONG TERM GOAL #5   Title [name] will use multimodal communication spontaneously  (writing, gesture, device) to augment verbal expression as needed 3/5 times with rare min A    Time 3    Period Weeks    Status On-going  SLP LONG TERM GOAL #6   Title [name] will write (approximate) 4 items in personally relevant category that is understood by family/friends    Baseline 08/22/20; 08/31/20; 09/06/19    Time 4    Period Weeks    Status Achieved      SLP LONG TERM GOAL #7   Title Pt will employ multimodal communication to participate in 6 turns in conversation with extended time and occasional min A    Time 4    Period Weeks    Status On-going            Plan - 09/12/20 0933    Clinical Impression Statement Improving moderate Broca's aphasia and severe verbal apraxia persist, affecting communication of basic wants, needs and social interactions.[name] is highly motivated and 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 His utterance length in improving and sentences with accurate syntax are emerging with extended time and significant halting. Ongoing training in multimodal communication - . Continue skilled ST to maximize communication for QOL, safety and reduce family burden.    Speech Therapy Frequency 2x / week    Duration --   20 weeks or 40 visits   Treatment/Interventions Environmental controls;Cueing hierarchy;SLP instruction and feedback;Compensatory strategies;Functional tasks;Cognitive reorganization;Compensatory techniques;Internal/external aids;Multimodal communcation approach;Patient/family education;Language facilitation    Potential to Achieve Goals Good    Potential Considerations Severity of impairments           Patient will benefit from skilled therapeutic intervention in order to improve the following deficits and impairments:   Aphasia  Verbal  apraxia    Problem List Patient Active Problem List   Diagnosis Date Noted  . Benign essential HTN 06/09/2020  . Abnormality of gait 04/28/2020  . Neurogenic bladder 03/17/2020  . Sepsis due to pneumonia ([place]) 03/06/2020  . History of stroke with residual deficit 03/06/2020  . Transient hypotension 03/06/2020  . Spastic hemiplegia affecting nondominant side ([place])   . History of hypertension   . Expressive aphasia   . AKI (acute kidney injury) ([place])   . Global aphasia   . Hypoalbuminemia due to protein-calorie malnutrition ([place])   . Dysphagia, post-stroke   . Hyperlipidemia 02/02/2020  . Dysphagia following cerebral infarction 02/02/2020  . Aspiration pneumonia ([place]) 02/02/2020  . Acute ischemic left MCA stroke ([place]) 02/02/2020  . Acute respiratory failure ([place])   . Acute ischemic stroke [facility]) s/p clot retrieval L MCA & ACA A3 01/27/2020  . Aspiration into airway 01/27/2020  . Chronic anticoagulation 01/27/2020  . Paroxysmal atrial fibrillation ([place]) 06/15/2018  . Essential hypertension 06/15/2018  . NICM (nonischemic cardiomyopathy) ([place]) 06/15/2018  . Visit for monitoring Tikosyn therapy 06/12/2018    Menaal Russum, Annye Rusk MS, CCC-SLP 09/12/2020, 9:37 AM  [facility] [address] [place], [place], [address] Phone: [phone]   Fax:  [phone]   Name: JAVIER GELL MRN: [id number] Date of Birth: [birth year]

## 2020-09-12 NOTE — Patient Instructions (Signed)
   Write a summary of your works for the show with all of the details you want to communicate And practice  I had a stroke in June and I'm doing great.  Thank you for asking  It's good to see you  I working on physical therapy  Fall semester  I'm Getting ready for H2O exhibit

## 2020-09-12 NOTE — Therapy (Signed)
[facility] [address] [place], [place], [address] Phone: [phone]   Fax:  [phone]  Physical Therapy Treatment  Patient Details  Name: Dustin Schneider MRN: [id number] Date of Birth: 1956/06/25 Referring Provider (PT): Lauraine Rinne, PA-C but followed by Delice Lesch   Encounter Date: 09/12/2020   PT End of Session - 09/12/20 1222    Visit Number 23    Number of Visits 47    Date for PT Re-Evaluation 10/25/20    Authorization Type BCBS    PT Start Time 0933    PT Stop Time 1015    PT Time Calculation (min) 42 min    Equipment Utilized During Treatment Gait belt    Activity Tolerance Patient tolerated treatment well    Behavior During Therapy [facility] for tasks assessed/performed           Past Medical History:  Diagnosis Date  . Hypertension   . NICM (nonischemic cardiomyopathy) ([place]) 06/15/2018   04/23/18: EF 45%, 09/15/18: NOrmal LVEF  . Paroxysmal atrial fibrillation ([place]) 06/15/2018  . Stroke ([place])   . Visit for monitoring Tikosyn therapy 06/12/2018    Past Surgical History:  Procedure Laterality Date  . BUBBLE STUDY  03/11/2020   Procedure: BUBBLE STUDY;  Surgeon: [name], MD;  Location: [place];  Service: Cardiovascular;;  . CARDIOVERSION N/A 05/13/2018   Procedure: CARDIOVERSION;  Surgeon: [name], MD;  Location: [facility] ENDOSCOPY;  Service: Cardiovascular;  Laterality: N/A;  . CARDIOVERSION N/A 06/13/2018   Procedure: CARDIOVERSION;  Surgeon: [name], MD;  Location: [facility] ENDOSCOPY;  Service: Cardiovascular;  Laterality: N/A;  . CARDIOVERSION N/A 06/23/2018   Procedure: CARDIOVERSION;  Surgeon: [name], MD;  Location: [facility] ENDOSCOPY;  Service: Cardiovascular;  Laterality: N/A;  . CARDIOVERSION N/A 03/11/2020   Procedure: CARDIOVERSION;  Surgeon: [name], MD;  Location: [place];  Service: Cardiovascular;  Laterality: N/A;  . excision of actinic keratosis    . EYE SURGERY     eye  lift  . IR CT HEAD LTD  01/27/2020  . IR PERCUTANEOUS ART THROMBECTOMY/INFUSION INTRACRANIAL INC DIAG ANGIO  01/27/2020      . IR PERCUTANEOUS ART THROMBECTOMY/INFUSION INTRACRANIAL INC DIAG ANGIO  01/27/2020  . RADIOLOGY WITH ANESTHESIA N/A 01/27/2020   Procedure: IR WITH ANESTHESIA;  Surgeon: Radiologist, Medication, MD;  Location: [place];  Service: Radiology;  Laterality: N/A;  . TEE WITHOUT CARDIOVERSION N/A 03/11/2020   Procedure: TRANSESOPHAGEAL ECHOCARDIOGRAM (TEE);  Surgeon: [name], MD;  Location: [place];  Service: Cardiovascular;  Laterality: N/A;  . TONSILLECTOMY    . WISDOM TOOTH EXTRACTION      There were no vitals filed for this visit.   Subjective Assessment - 09/12/20 0941    Subjective Going to a breakfast tomorrow at [employer] for the [place] [employer]    Pertinent History PMH: HTN, paroxysmal a fib    Limitations Standing;Walking    Patient Stated Goals "wants everything working on R side again"    Currently in Pain? No/denies    Pain Onset 1 to 4 weeks ago                             [facility] Adult PT Treatment/Exercise - 09/12/20 0001      Transfers   Transfers Sit to Stand;Stand to Sit    Sit to Stand 5: Supervision    Stand to Sit 5: Supervision      Ambulation/Gait  Ambulation/Gait Yes    Ambulation/Gait Assistance 5: Supervision;4: Min guard;4: Min assist    Ambulation/Gait Assistance Details supervision in and out of clinic with RW. pt brought in [facility] with 6 prong tip cane from home. ambulated x2 laps with min guard, then performed an additional lap with pt scanning environment L/R with min A at times for balance. pt ambulated 115' with gf [name] providing min guard. cues intermittently throughout for posture. discussed with pt and pt's gf that pt is cleared to use SPC with 6 prong tip for home with family only and gait belt for safety for ease of negotiation into bathroom and for small household distances over level ground only. both verbalized  understanding    Ambulation Distance (Feet) 460 Feet    Assistive device Straight cane   with 6 prong tip   Gait Pattern Step-through pattern;Decreased hip/knee flexion - right;Poor foot clearance - right    Ambulation Surface Level;Indoor    Curb Details (indicate cue type and reason) modified curb training using 4" aerobic step and SPC with 6 prong tip: x10 reps with proper sequencing with cane, needing min A approx. 3-4 reps to help clear RLE on step with min guard/min A for balance throughout, x1 rep descending small curb with min guard    Gait Comments replaced pt's tennis balls on RW      Neuro Re-ed    Neuro Re-ed Details  in // bars with single UE support: reciprocal stepping over 2 yard sticks and 2 2" foam beams down and back x6 reps working on foot clearance, cues for slowed and controlled for incr RLE foot clearance, with pt bumping obstacle a couple of times, but improvement in foot clearance even over higher obstacles      Knee/Hip Exercises: Standing   Forward Step Up Right;Left;Both;1 set;Hand Hold: 1;Step Height: 6"   12 reps   Forward Step Up Limitations at bottom of staircase with single UE support: stepping up with LLE first and down with LLE first - working on improved foot clearance with RLE on and off step, then performing step ups with RLE first with continued focus on RLE foot clearance and strengthening                  PT Education - 09/12/20 1220    Education Details walking small distances at home (level surfaces only)- such as into the bathroom/kitchen with family/caregiver only with SPC with 6 prong tip and with gait belt    Person(s) Educated Patient   pt' gf [name]   Methods Explanation;Demonstration;Verbal cues    Comprehension Verbalized understanding;Returned demonstration            PT Short Term Goals - 08/26/20 0958      PT SHORT TERM GOAL #1   Title Pt will decrease TUG from 21 sec to <18 sec for improved balance and functional mobility.     Baseline 08/26/20 21 sec with RW    Time 4    Period Weeks    Status New    Target Date 09/25/20      PT SHORT TERM GOAL #2   Title Pt will ambulate >800' on varied surfaces with RW/right hand grip attachment and AFO supervision for improved short community distances.    Baseline 600' CGA with walker    Time 4    Period Weeks    Status New    Target Date 09/25/20  PT Long Term Goals - 08/26/20 1000      PT LONG TERM GOAL #1   Title Pt and pt's family members will be independent with final HEP in order to build upon functional gains made in therapy. ALL LTGS DUE3/15/22    Baseline PT continues to update HEP as pt is progressing. Most recent update 08/26/20    Time 8    Period Weeks    Status On-going    Target Date 10/25/20      PT LONG TERM GOAL #2   Title Pt will ambulate 250' with cane and right AFO CGA for improved household mobility on level surfaces.    Time 8    Period Weeks    Status New    Target Date 10/25/20      PT LONG TERM GOAL #3   Title Pt will increase Berg from 38 to >42/56 for improved balance and decreased fall risk.    Baseline 08/26/20 38/56    Time 8    Period Weeks    Status New    Target Date 10/25/20      PT LONG TERM GOAL #4   Title Pt will ambulate up/down 2 steps with LRAD supervision for improved access in home to living room    Baseline CGA with RW    Time 8    Period Weeks    Status New    Target Date 10/25/20      PT LONG TERM GOAL #5   Title Pt will increase gait speed from 0.24m/s to >0.53m/s for improved community mobility.    Baseline 08/26/20 0.20m/s    Time 8    Period Weeks    Status New    Target Date 10/25/20                 Plan - 09/12/20 1410    Clinical Impression Statement Continued to perform gait training with SPC with 6 prong tip (one that pt brought in). [name] gf [name] present for gait training for small distances at home for pt to ambulate into the bathroom/kitchen with min guard and use of  gait belt. No issues noted. Pt cleared for small distances with caregivers only in the home over level ground, but to continue using RW when more fatigued/at the end of the day. Remainder of session focused on RLE strengthening and activities for incr foot clearance. Will continue to progress towards LTGs.    Personal Factors and Comorbidities Comorbidity 3+    Comorbidities dense L MCA CVA, paroxysmal A. fib on chronic Xarelto, nonischemic cardiomyopathy and hypertension.    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, works as an Electrical engineer Evolving/Moderate complexity    Rehab Potential Good    PT Frequency 2x / week    PT Duration 8 weeks    PT Treatment/Interventions ADLs/Self Care Home Management;Aquatic Therapy;DME Instruction;Gait training;Stair training;Functional mobility training;Therapeutic activities;Therapeutic exercise;Electrical Stimulation;Balance training;Neuromuscular re-education;Wheelchair mobility training;Orthotic Fit/Training;Patient/family education;Passive range of motion;Energy conservation    PT Next Visit Plan gait training with cane with obstacles, Try to get more hip adductor activation. RLE foot clearance. stair training,  RLE NMR with decr UE support, balance on compliant surfaces, gait over unlevel surfaces.    PT Home Exercise Plan on 06/28/20 - added standing at countetop and stepping LLE out and in    Consulted and Agree with Plan of Care Patient;Family member/caregiver  Family Member Consulted [name]           Patient will benefit from skilled therapeutic intervention in order to improve the following deficits and impairments:  Abnormal gait,Decreased activity tolerance,Decreased balance,Decreased cognition,Decreased mobility,Decreased  coordination,Decreased range of motion,Decreased endurance,Decreased strength,Hypomobility,Difficulty walking,Impaired UE functional use,Impaired vision/preception,Postural dysfunction,Impaired sensation  Visit Diagnosis: Other abnormalities of gait and mobility  Muscle weakness (generalized)  Unsteadiness on feet  Difficulty in walking, not elsewhere classified     Problem List Patient Active Problem List   Diagnosis Date Noted  . Benign essential HTN 06/09/2020  . Abnormality of gait 04/28/2020  . Neurogenic bladder 03/17/2020  . Sepsis due to pneumonia ([place]) 03/06/2020  . History of stroke with residual deficit 03/06/2020  . Transient hypotension 03/06/2020  . Spastic hemiplegia affecting nondominant side ([place])   . History of hypertension   . Expressive aphasia   . AKI (acute kidney injury) ([place])   . Global aphasia   . Hypoalbuminemia due to protein-calorie malnutrition ([place])   . Dysphagia, post-stroke   . Hyperlipidemia 02/02/2020  . Dysphagia following cerebral infarction 02/02/2020  . Aspiration pneumonia ([place]) 02/02/2020  . Acute ischemic left MCA stroke ([place]) 02/02/2020  . Acute respiratory failure ([place])   . Acute ischemic stroke [facility]) s/p clot retrieval L MCA & ACA A3 01/27/2020  . Aspiration into airway 01/27/2020  . Chronic anticoagulation 01/27/2020  . Paroxysmal atrial fibrillation ([place]) 06/15/2018  . Essential hypertension 06/15/2018  . NICM (nonischemic cardiomyopathy) ([place]) 06/15/2018  . Visit for monitoring Tikosyn therapy 06/12/2018    Arliss Journey, PT, DPT  09/12/2020, 2:13 PM  [facility] [address] [place], [place], [address] Phone: [phone]   Fax:  [phone]  Name: Dustin Schneider MRN: [id number] Date of Birth: [birth year]

## 2020-09-14 ENCOUNTER — Other Ambulatory Visit: Payer: Self-pay

## 2020-09-14 ENCOUNTER — Ambulatory Visit: Payer: BC Managed Care – PPO | Attending: Physician Assistant

## 2020-09-14 DIAGNOSIS — R4701 Aphasia: Secondary | ICD-10-CM | POA: Insufficient documentation

## 2020-09-14 DIAGNOSIS — R29818 Other symptoms and signs involving the nervous system: Secondary | ICD-10-CM | POA: Insufficient documentation

## 2020-09-14 DIAGNOSIS — R278 Other lack of coordination: Secondary | ICD-10-CM | POA: Diagnosis present

## 2020-09-14 DIAGNOSIS — R482 Apraxia: Secondary | ICD-10-CM | POA: Insufficient documentation

## 2020-09-14 DIAGNOSIS — R2689 Other abnormalities of gait and mobility: Secondary | ICD-10-CM

## 2020-09-14 DIAGNOSIS — M6281 Muscle weakness (generalized): Secondary | ICD-10-CM | POA: Diagnosis present

## 2020-09-14 DIAGNOSIS — R262 Difficulty in walking, not elsewhere classified: Secondary | ICD-10-CM | POA: Insufficient documentation

## 2020-09-14 DIAGNOSIS — I69351 Hemiplegia and hemiparesis following cerebral infarction affecting right dominant side: Secondary | ICD-10-CM | POA: Diagnosis present

## 2020-09-14 DIAGNOSIS — R2681 Unsteadiness on feet: Secondary | ICD-10-CM | POA: Insufficient documentation

## 2020-09-14 NOTE — Therapy (Signed)
[facility] [address] [place], [place], [address] Phone: [phone]   Fax:  [phone]  Physical Therapy Treatment  Patient Details  Name: Dustin Schneider MRN: [id number] Date of Birth: Nov 03, 1955 Referring Provider (PT): Lauraine Rinne, PA-C but followed by Delice Lesch   Encounter Date: 09/14/2020   PT End of Session - 09/14/20 0849    Visit Number 75    Number of Visits 55    Date for PT Re-Evaluation 10/25/20    Authorization Type BCBS    PT Start Time 0845    PT Stop Time 0932    PT Time Calculation (min) 47 min    Equipment Utilized During Treatment Gait belt    Activity Tolerance Patient tolerated treatment well    Behavior During Therapy [facility] for tasks assessed/performed           Past Medical History:  Diagnosis Date  . Hypertension   . NICM (nonischemic cardiomyopathy) ([place]) 06/15/2018   04/23/18: EF 45%, 09/15/18: NOrmal LVEF  . Paroxysmal atrial fibrillation ([place]) 06/15/2018  . Stroke ([place])   . Visit for monitoring Tikosyn therapy 06/12/2018    Past Surgical History:  Procedure Laterality Date  . BUBBLE STUDY  03/11/2020   Procedure: BUBBLE STUDY;  Surgeon: [name], MD;  Location: [place];  Service: Cardiovascular;;  . CARDIOVERSION N/A 05/13/2018   Procedure: CARDIOVERSION;  Surgeon: [name], MD;  Location: [facility] ENDOSCOPY;  Service: Cardiovascular;  Laterality: N/A;  . CARDIOVERSION N/A 06/13/2018   Procedure: CARDIOVERSION;  Surgeon: [name], MD;  Location: [facility] ENDOSCOPY;  Service: Cardiovascular;  Laterality: N/A;  . CARDIOVERSION N/A 06/23/2018   Procedure: CARDIOVERSION;  Surgeon: [name], MD;  Location: [facility] ENDOSCOPY;  Service: Cardiovascular;  Laterality: N/A;  . CARDIOVERSION N/A 03/11/2020   Procedure: CARDIOVERSION;  Surgeon: [name], MD;  Location: [place];  Service: Cardiovascular;  Laterality: N/A;  . excision of actinic keratosis    . EYE SURGERY     eye  lift  . IR CT HEAD LTD  01/27/2020  . IR PERCUTANEOUS ART THROMBECTOMY/INFUSION INTRACRANIAL INC DIAG ANGIO  01/27/2020      . IR PERCUTANEOUS ART THROMBECTOMY/INFUSION INTRACRANIAL INC DIAG ANGIO  01/27/2020  . RADIOLOGY WITH ANESTHESIA N/A 01/27/2020   Procedure: IR WITH ANESTHESIA;  Surgeon: Radiologist, Medication, MD;  Location: [place];  Service: Radiology;  Laterality: N/A;  . TEE WITHOUT CARDIOVERSION N/A 03/11/2020   Procedure: TRANSESOPHAGEAL ECHOCARDIOGRAM (TEE);  Surgeon: [name], MD;  Location: [place];  Service: Cardiovascular;  Laterality: N/A;  . TONSILLECTOMY    . WISDOM TOOTH EXTRACTION      There were no vitals filed for this visit.   Subjective Assessment - 09/14/20 0850    Subjective Pt reports he is doing well. He walked a little with family member at home with cane. Went to his breakfast at [employer] yesterday and used w/c and walker.    Pertinent History PMH: HTN, paroxysmal a fib    Limitations Standing;Walking    Patient Stated Goals "wants everything working on R side again"    Currently in Pain? No/denies    Pain Onset 1 to 4 weeks ago                             [facility] Adult PT Treatment/Exercise - 09/14/20 0852      Transfers   Transfers Sit to Stand;Stand to Sit    Sit to  Stand 5: Supervision    Stand to Sit 5: Supervision      Ambulation/Gait   Ambulation/Gait Yes    Ambulation/Gait Assistance 4: Min assist    Ambulation/Gait Assistance Details Pt was given cues to focus on increasing right foot clearance as new shoes do not have toe cap and catching toe a bit more. Tactile cues to right shoulder from PT for upright posture as well. Raising cane up a notch did seem to help some with more upright posture.    Ambulation Distance (Feet) 345 Feet   115' with cane up one notch   Assistive device Straight cane   right AFO and quad tip on cane   Gait Pattern Step-through pattern;Decreased hip/knee flexion - right;Poor foot clearance - right     Ambulation Surface Level;Indoor    Curb 4: Min assist    Curb Details (indicate cue type and reason) utilized pt's cane with 6 tip on it. Step-to pattern with verbal cues for sequence. Pt caught right toes a couple times when brining right foot up.    Gait Comments In // bars: worked on tapping 4" step x 10 each foot with verbal cues to not circumduct right leg and tactile cues at pelvis to help,  then step ups with light LUE support x 10 each leg with tactile cues to weight shift on right during SLS periods, then switched to cane support with stepping up and down with LLE up and RLE down to mimic curb.      Neuro Re-ed    Neuro Re-ed Details  Gait over obstacle course: stepping over 2 black 2x4" foam beams and then weaving in and out of 4 cones with cane x 6 CGA. Pt was cued to lead over with RLE so he could be sure he cleared foot. Sit to stand with soft blue foam beam under feet x 10 with light LUE support.                  PT Education - 09/14/20 1123    Education Details Discussed using old shoes when trying cane with family at home due to not having toe cap on new shoes and needing more assistance right now.    Person(s) Educated Patient;Caregiver(s)    Methods Explanation    Comprehension Verbalized understanding            PT Short Term Goals - 08/26/20 0958      PT SHORT TERM GOAL #1   Title Pt will decrease TUG from 21 sec to <18 sec for improved balance and functional mobility.    Baseline 08/26/20 21 sec with RW    Time 4    Period Weeks    Status New    Target Date 09/25/20      PT SHORT TERM GOAL #2   Title Pt will ambulate >800' on varied surfaces with RW/right hand grip attachment and AFO supervision for improved short community distances.    Baseline 600' CGA with walker    Time 4    Period Weeks    Status New    Target Date 09/25/20             PT Long Term Goals - 08/26/20 1000      PT LONG TERM GOAL #1   Title Pt and pt's family members will  be independent with final HEP in order to build upon functional gains made in therapy. ALL LTGS DUE3/15/22    Baseline PT continues to  update HEP as pt is progressing. Most recent update 08/26/20    Time 8    Period Weeks    Status On-going    Target Date 10/25/20      PT LONG TERM GOAL #2   Title Pt will ambulate 250' with cane and right AFO CGA for improved household mobility on level surfaces.    Time 8    Period Weeks    Status New    Target Date 10/25/20      PT LONG TERM GOAL #3   Title Pt will increase Berg from 38 to >42/56 for improved balance and decreased fall risk.    Baseline 08/26/20 38/56    Time 8    Period Weeks    Status New    Target Date 10/25/20      PT LONG TERM GOAL #4   Title Pt will ambulate up/down 2 steps with LRAD supervision for improved access in home to living room    Baseline CGA with RW    Time 8    Period Weeks    Status New    Target Date 10/25/20      PT LONG TERM GOAL #5   Title Pt will increase gait speed from 0.81m/s to >0.38m/s for improved community mobility.    Baseline 08/26/20 0.26m/s    Time 8    Period Weeks    Status New    Target Date 10/25/20                 Plan - 09/14/20 1123    Clinical Impression Statement Pt had new velcro shoes today that did not have toe cap on them. This made right foot clearance a little more difficult at times with cane needing more assistance. Advised to get toe cap added on new shoes.    Personal Factors and Comorbidities Comorbidity 3+    Comorbidities dense L MCA CVA, paroxysmal A. fib on chronic Xarelto, nonischemic cardiomyopathy and hypertension.    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 gait training with cane with obstacles, Try to get more hip adductor activation. RLE foot clearance. stair training,  RLE NMR with decr UE support, balance on compliant surfaces, gait over unlevel surfaces.    PT Home Exercise Plan on 06/28/20 - added standing at countetop and stepping LLE out and in    Consulted and Agree with Plan of Care Patient;Family member/caregiver    Family Member Consulted [name]           Patient will benefit from skilled therapeutic intervention in order to improve the following deficits and impairments:  Abnormal gait,Decreased activity tolerance,Decreased balance,Decreased cognition,Decreased mobility,Decreased coordination,Decreased range of motion,Decreased endurance,Decreased strength,Hypomobility,Difficulty walking,Impaired UE functional use,Impaired vision/preception,Postural dysfunction,Impaired sensation  Visit Diagnosis: Other abnormalities of gait and mobility  Muscle weakness (generalized)  Hemiplegia and hemiparesis following cerebral infarction affecting right dominant side [facility])     Problem List Patient Active Problem List   Diagnosis Date Noted  . Benign  essential HTN 06/09/2020  . Abnormality of gait 04/28/2020  . Neurogenic bladder 03/17/2020  . Sepsis due to pneumonia ([place]) 03/06/2020  . History of stroke with residual deficit 03/06/2020  . Transient hypotension 03/06/2020  . Spastic hemiplegia affecting nondominant side ([place])   . History of  hypertension   . Expressive aphasia   . AKI (acute kidney injury) ([place])   . Global aphasia   . Hypoalbuminemia due to protein-calorie malnutrition ([place])   . Dysphagia, post-stroke   . Hyperlipidemia 02/02/2020  . Dysphagia following cerebral infarction 02/02/2020  . Aspiration pneumonia ([place]) 02/02/2020  . Acute ischemic left MCA stroke ([place]) 02/02/2020  . Acute respiratory failure ([place])   . Acute ischemic stroke [facility]) s/p clot retrieval L MCA & ACA A3 01/27/2020  . Aspiration into airway 01/27/2020  . Chronic anticoagulation 01/27/2020  . Paroxysmal atrial fibrillation ([place]) 06/15/2018  . Essential hypertension 06/15/2018  . NICM (nonischemic cardiomyopathy) ([place]) 06/15/2018  . Visit for monitoring Tikosyn therapy 06/12/2018    Electa Sniff, PT, DPT, NCS 09/14/2020, 11:26 AM  [facility] [address] [place], [place], [address] Phone: [phone]   Fax:  [phone]  Name: JACOBEY GURA MRN: [id number] Date of Birth: 04-09-1956

## 2020-09-19 ENCOUNTER — Ambulatory Visit: Payer: BC Managed Care – PPO | Admitting: Speech Pathology

## 2020-09-19 ENCOUNTER — Other Ambulatory Visit: Payer: Self-pay

## 2020-09-19 ENCOUNTER — Encounter: Payer: Self-pay | Admitting: Speech Pathology

## 2020-09-19 ENCOUNTER — Encounter: Payer: Self-pay | Admitting: Physical Therapy

## 2020-09-19 ENCOUNTER — Ambulatory Visit: Payer: BC Managed Care – PPO | Admitting: Physical Therapy

## 2020-09-19 DIAGNOSIS — R2681 Unsteadiness on feet: Secondary | ICD-10-CM

## 2020-09-19 DIAGNOSIS — R4701 Aphasia: Secondary | ICD-10-CM

## 2020-09-19 DIAGNOSIS — R482 Apraxia: Secondary | ICD-10-CM

## 2020-09-19 DIAGNOSIS — R278 Other lack of coordination: Secondary | ICD-10-CM

## 2020-09-19 DIAGNOSIS — R2689 Other abnormalities of gait and mobility: Secondary | ICD-10-CM | POA: Diagnosis not present

## 2020-09-19 DIAGNOSIS — M6281 Muscle weakness (generalized): Secondary | ICD-10-CM

## 2020-09-19 DIAGNOSIS — R29818 Other symptoms and signs involving the nervous system: Secondary | ICD-10-CM

## 2020-09-19 NOTE — Therapy (Signed)
[facility] [address] [place], [place], [address] Phone: [phone]   Fax:  [phone]  Physical Therapy Treatment  Patient Details  Name: Dustin Schneider MRN: [id number] Date of Birth: 26-Mar-1956 Referring Provider (PT): Lauraine Rinne, PA-C but followed by Delice Lesch   Encounter Date: 09/19/2020   PT End of Session - 09/19/20 1133    Visit Number 58    Number of Visits 22    Date for PT Re-Evaluation 10/25/20    Authorization Type BCBS    PT Start Time 0933    PT Stop Time 1016    PT Time Calculation (min) 43 min    Equipment Utilized During Treatment Gait belt    Activity Tolerance Patient tolerated treatment well    Behavior During Therapy [facility] for tasks assessed/performed           Past Medical History:  Diagnosis Date  . Hypertension   . NICM (nonischemic cardiomyopathy) ([place]) 06/15/2018   04/23/18: EF 45%, 09/15/18: NOrmal LVEF  . Paroxysmal atrial fibrillation ([place]) 06/15/2018  . Stroke ([place])   . Visit for monitoring Tikosyn therapy 06/12/2018    Past Surgical History:  Procedure Laterality Date  . BUBBLE STUDY  03/11/2020   Procedure: BUBBLE STUDY;  Surgeon: [name], MD;  Location: [place];  Service: Cardiovascular;;  . CARDIOVERSION N/A 05/13/2018   Procedure: CARDIOVERSION;  Surgeon: [name], MD;  Location: [facility] ENDOSCOPY;  Service: Cardiovascular;  Laterality: N/A;  . CARDIOVERSION N/A 06/13/2018   Procedure: CARDIOVERSION;  Surgeon: [name], MD;  Location: [facility] ENDOSCOPY;  Service: Cardiovascular;  Laterality: N/A;  . CARDIOVERSION N/A 06/23/2018   Procedure: CARDIOVERSION;  Surgeon: [name], MD;  Location: [facility] ENDOSCOPY;  Service: Cardiovascular;  Laterality: N/A;  . CARDIOVERSION N/A 03/11/2020   Procedure: CARDIOVERSION;  Surgeon: [name], MD;  Location: [place];  Service: Cardiovascular;  Laterality: N/A;  . excision of actinic keratosis    . EYE SURGERY     eye  lift  . IR CT HEAD LTD  01/27/2020  . IR PERCUTANEOUS ART THROMBECTOMY/INFUSION INTRACRANIAL INC DIAG ANGIO  01/27/2020      . IR PERCUTANEOUS ART THROMBECTOMY/INFUSION INTRACRANIAL INC DIAG ANGIO  01/27/2020  . RADIOLOGY WITH ANESTHESIA N/A 01/27/2020   Procedure: IR WITH ANESTHESIA;  Surgeon: Radiologist, Medication, MD;  Location: [place];  Service: Radiology;  Laterality: N/A;  . TEE WITHOUT CARDIOVERSION N/A 03/11/2020   Procedure: TRANSESOPHAGEAL ECHOCARDIOGRAM (TEE);  Surgeon: [name], MD;  Location: [place];  Service: Cardiovascular;  Laterality: N/A;  . TONSILLECTOMY    . WISDOM TOOTH EXTRACTION      There were no vitals filed for this visit.   Subjective Assessment - 09/19/20 0940    Subjective Doing well at home. Still needing to put the toe cap on the R shoe of his new velco shoes.    Pertinent History PMH: HTN, paroxysmal a fib    Limitations Standing;Walking    Patient Stated Goals "wants everything working on R side again"    Currently in Pain? No/denies    Pain Onset 1 to 4 weeks ago                             [facility] Adult PT Treatment/Exercise - 09/19/20 0001      Transfers   Transfers Sit to Stand;Stand to Sit    Sit to Stand 5: Supervision    Stand to Sit  5: Supervision      Ambulation/Gait   Ambulation/Gait Yes    Ambulation/Gait Assistance 4: Min guard;4: Min assist    Ambulation/Gait Assistance Details performed 3 laps with SPC with 6 prong tip, pt with improved foot clearance with RLE today as wearing old sneakers. discussed with pt's gf that when walking at home at this time making sure pt is wearing sneakers with toe cap until toe cap can be put on his new velco shoes    Ambulation Distance (Feet) 345 Feet   x1   Assistive device Straight cane   R AFO, 6 prong tip   Gait Pattern Step-through pattern;Decreased hip/knee flexion - right;Poor foot clearance - right    Ambulation Surface Level;Indoor    Gait Comments working on weaving  around cones and chair for obstacle negotiation 4 x 30', stepping over 5 obstacles (3 walking poles, two 2" foam beam) over a span of approx. 30' performed x6 reps - with pt needing initial cues for proper cane placement and stepping over with RLE for clearing obstacles, min A for balance/safety               Balance Exercises - 09/19/20 0001      Balance Exercises: Standing   Rockerboard Anterior/posterior    Rockerboard Limitations in A/P direction with single UE support/ fingertip support: marching up LLE while standing on board with RLE x15 reps for SLS/weight shift,  stepping LLE down off front of board and back on x10 reps - therapist helping assist RLE stay in proper position    Other Standing Exercises on blue mat: modified wide tandem stance with RLE posteriorly - performed eyes closed 2 x 5 reps head turns, 2 x 5 reps head nods - with min A/mod A for balance towards end, with verbal and tactile cues for R quad activation , forward walking on blue compliant mat with single UE support x3 reps               PT Short Term Goals - 08/26/20 0958      PT SHORT TERM GOAL #1   Title Pt will decrease TUG from 21 sec to <18 sec for improved balance and functional mobility.    Baseline 08/26/20 21 sec with RW    Time 4    Period Weeks    Status New    Target Date 09/25/20      PT SHORT TERM GOAL #2   Title Pt will ambulate >800' on varied surfaces with RW/right hand grip attachment and AFO supervision for improved short community distances.    Baseline 600' CGA with walker    Time 4    Period Weeks    Status New    Target Date 09/25/20             PT Long Term Goals - 08/26/20 1000      PT LONG TERM GOAL #1   Title Pt and pt's family members will be independent with final HEP in order to build upon functional gains made in therapy. ALL LTGS DUE3/15/22    Baseline PT continues to update HEP as pt is progressing. Most recent update 08/26/20    Time 8    Period Weeks     Status On-going    Target Date 10/25/20      PT LONG TERM GOAL #2   Title Pt will ambulate 250' with cane and right AFO CGA for improved household mobility on level surfaces.  Time 8    Period Weeks    Status New    Target Date 10/25/20      PT LONG TERM GOAL #3   Title Pt will increase Berg from 38 to >42/56 for improved balance and decreased fall risk.    Baseline 08/26/20 38/56    Time 8    Period Weeks    Status New    Target Date 10/25/20      PT LONG TERM GOAL #4   Title Pt will ambulate up/down 2 steps with LRAD supervision for improved access in home to living room    Baseline CGA with RW    Time 8    Period Weeks    Status New    Target Date 10/25/20      PT LONG TERM GOAL #5   Title Pt will increase gait speed from 0.73m/s to >0.51m/s for improved community mobility.    Baseline 08/26/20 0.29m/s    Time 8    Period Weeks    Status New    Target Date 10/25/20                 Plan - 09/19/20 1143    Clinical Impression Statement Continued gait training today with pt's SPC with 6 prong tip and pt's sneakers with toe cap. Pt needing min guard during gait, but min A when performing gait with obstacle negotiation - weaving around cones and stepping over small obstacles. Pt tolerated session well, will continue to progress towards LTGs.    Personal Factors and Comorbidities Comorbidity 3+    Comorbidities dense L MCA CVA, paroxysmal A. fib on chronic Xarelto, nonischemic cardiomyopathy and hypertension.    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 Are they getting toe cap on new shoes? gait training with cane with obstacles, Try to get more hip adductor activation. RLE foot clearance. stair training,  RLE NMR with decr UE support, balance on compliant surfaces, gait over unlevel surfaces.    PT Home Exercise Plan on 06/28/20 - added standing at countetop and stepping LLE out and in    Consulted and Agree with Plan of Care Patient;Family member/caregiver    Family Member Consulted [name]           Patient will benefit from skilled therapeutic intervention in order to improve the following deficits and impairments:  Abnormal gait,Decreased activity tolerance,Decreased balance,Decreased cognition,Decreased mobility,Decreased coordination,Decreased range of motion,Decreased endurance,Decreased strength,Hypomobility,Difficulty walking,Impaired UE functional use,Impaired vision/preception,Postural dysfunction,Impaired sensation  Visit Diagnosis: Other abnormalities of gait and mobility  Muscle weakness (generalized)  Unsteadiness on feet  Other lack of coordination  Other symptoms and signs involving the nervous system     Problem List Patient Active Problem List   Diagnosis Date Noted  . Benign essential HTN 06/09/2020  . Abnormality of gait 04/28/2020  . Neurogenic bladder 03/17/2020  . Sepsis due to pneumonia ([place]) 03/06/2020  . History of stroke with residual deficit 03/06/2020  . Transient hypotension 03/06/2020  . Spastic hemiplegia affecting nondominant  side ([place])   . History of hypertension   . Expressive aphasia   . AKI (acute kidney injury) ([place])   . Global aphasia    . Hypoalbuminemia due to protein-calorie malnutrition ([place])   . Dysphagia, post-stroke   . Hyperlipidemia 02/02/2020  . Dysphagia following cerebral infarction 02/02/2020  . Aspiration pneumonia ([employer]) 02/02/2020  . Acute ischemic left MCA stroke ([place]) 02/02/2020  . Acute respiratory failure ([place])   . Acute ischemic stroke [facility]) s/p clot retrieval L MCA & ACA A3 01/27/2020  . Aspiration into airway 01/27/2020  . Chronic anticoagulation 01/27/2020  . Paroxysmal atrial fibrillation ([place]) 06/15/2018  . Essential hypertension 06/15/2018  . NICM (nonischemic cardiomyopathy) ([place]) 06/15/2018  . Visit for monitoring Tikosyn therapy 06/12/2018    Arliss Journey, PT, DPT  09/19/2020, 11:45 AM  [facility] [address] [place], [place], [address] Phone: [phone]   Fax:  [phone]  Name: Dustin Schneider MRN: [id number] Date of Birth: 08/10/1956

## 2020-09-19 NOTE — Patient Instructions (Addendum)
Complete 5-10 sentences about things you want people to know about you and your work for upcoming events  Complete scrambled sentence homework  Use FaceTime when making phone calls

## 2020-09-19 NOTE — Therapy (Signed)
[facility] [address] [place], [place], [address] Phone: [phone]   Fax:  [phone]  Speech Language Pathology Treatment  Patient Details  Name: Dustin Schneider MRN: [id number] Date of Birth: [birth year] Referring Provider (SLP): Dr. Delice Lesch   Encounter Date: 09/19/2020   End of Session - 09/19/20 0927    Visit Number 26    Number of Visits 40    Date for SLP Re-Evaluation 09/19/20    SLP Start Time 0845    SLP Stop Time  0930    SLP Time Calculation (min) 45 min    Activity Tolerance Patient tolerated treatment well           Past Medical History:  Diagnosis Date  . Hypertension   . NICM (nonischemic cardiomyopathy) ([place]) 06/15/2018   04/23/18: EF 45%, 09/15/18: NOrmal LVEF  . Paroxysmal atrial fibrillation ([place]) 06/15/2018  . Stroke ([place])   . Visit for monitoring Tikosyn therapy 06/12/2018    Past Surgical History:  Procedure Laterality Date  . BUBBLE STUDY  03/11/2020   Procedure: BUBBLE STUDY;  Surgeon: [name], MD;  Location: [place] [facility];  Service: Cardiovascular;;  . CARDIOVERSION N/A 05/13/2018   Procedure: CARDIOVERSION;  Surgeon: [name], MD;  Location: [facility] ENDOSCOPY;  Service: Cardiovascular;  Laterality: N/A;  . CARDIOVERSION N/A 06/13/2018   Procedure: CARDIOVERSION;  Surgeon: [name], MD;  Location: [facility] ENDOSCOPY;  Service: Cardiovascular;  Laterality: N/A;  . CARDIOVERSION N/A 06/23/2018   Procedure: CARDIOVERSION;  Surgeon: [name], MD;  Location: [facility] ENDOSCOPY;  Service: Cardiovascular;  Laterality: N/A;  . CARDIOVERSION N/A 03/11/2020   Procedure: CARDIOVERSION;  Surgeon: [name], MD;  Location: [place];  Service: Cardiovascular;  Laterality: N/A;  . excision of actinic keratosis    . EYE SURGERY     eye lift  . IR CT HEAD LTD  01/27/2020  . IR PERCUTANEOUS ART THROMBECTOMY/INFUSION INTRACRANIAL INC DIAG ANGIO  01/27/2020      . IR PERCUTANEOUS ART  THROMBECTOMY/INFUSION INTRACRANIAL INC DIAG ANGIO  01/27/2020  . RADIOLOGY WITH ANESTHESIA N/A 01/27/2020   Procedure: IR WITH ANESTHESIA;  Surgeon: Radiologist, Medication, MD;  Location: [facility];  Service: Radiology;  Laterality: N/A;  . TEE WITHOUT CARDIOVERSION N/A 03/11/2020   Procedure: TRANSESOPHAGEAL ECHOCARDIOGRAM (TEE);  Surgeon: [name], MD;  Location: [facility];  Service: Cardiovascular;  Laterality: N/A;  . TONSILLECTOMY    . WISDOM TOOTH EXTRACTION      There were no vitals filed for this visit.   Subjective Assessment - 09/19/20 0852    Subjective "I just winged it"    Patient is accompained by: Family member    Currently in Pain? No/denies                 ADULT SLP TREATMENT - 09/19/20 0859      General Information   Behavior/Cognition Cooperative;Pleasant mood      Treatment Provided   Treatment provided Cognitive-Linquistic      Cognitive-Linquistic Treatment   Treatment focused on Aphasia;Patient/family/caregiver education    Skilled Treatment Dustin Schneider provided pictures of sculptures with details of sculptures. Pt presents with non-fluent aphasia with frequent phonemic paraphasias. Frequent clarifiying questions and verbal cues required to improve listener comprehension. One word responses with slowed speech were occasionally effective. Given SLP modeling and phonemic cues, pt able to intermittently correct phonemic errors. SLP targeted use of written modality to optimize communcation with outside conversational partners; however, pt stated "I don't like it." SLP  targeted functional written tasks re: favorite art pieces, with syntactial errors noted with frequent one word responses noted. Writing did improve verbal output at phrase and sentence levels with intermittent cues required for clarification. Family member reports pt enjoys talking on phone. SLP recommended use of Facetime for visual feedback for listener for increased comprehension. SLP educated patient and  caregiver on writing and practicing FAQ for upcoming event, in which both verbalized understanding.      Progression Toward Goals   Progression toward goals Progressing toward goals            SLP Education - 09/19/20 0918    Education Details use Facetime with friends to improve flow of conversation, write pre-scripted responses for FAQ at events    Person(s) Educated Patient;Caregiver(s)    Methods Explanation;Verbal cues;Demonstration    Comprehension Verbalized understanding            SLP Short Term Goals - 09/19/20 0936      SLP SHORT TERM GOAL #1   Title Pt will name family, friends and art items with occasional min A 8/10 with approximations allowed.    Time 1    Period Weeks    Status Partially Met      SLP SHORT TERM GOAL #2   Title Pt will name 7 items in personally relevant categories with occasional min A over 2 sessions    Time 1    Period Weeks    Status Partially Met      SLP SHORT TERM GOAL #3   Title Pt will perform 4 automatic speech tasks with rare min A.    Time 1    Period Weeks    Status Partially Met      SLP SHORT TERM GOAL #4   Title Pt will write personal information (name/address/phone), 5 family/friend/pet names and 7  professional words with occasional min A    Time 1    Period Weeks    Status Not Met      SLP SHORT TERM GOAL #5   Title Pt will use mulitmodal communication (gesture, draw, write 1st letter etc) to augment verbal expression to meet needs at home with rare min A from family.    Time 1    Period Weeks    Status Partially Met            SLP Long Term Goals - 09/19/20 0936      SLP LONG TERM GOAL #1   Title Pt will verbalize 3 words to explain or describe his artwork and hobbies with occasional min A over 3 sessions, allowing for intellgible approximations    Baseline 05-17-20 (Lingraphica cues), 05-24-20 (lingraphica), 05-31-20 (lingraphica); 07/25/20 (written word)    Status Achieved      SLP LONG TERM GOAL #2    Title Pt and family will utilize multimodal compensations for aphasia and verbal apraxia to participate in 3 turns each of conversation with occasional min A over 2 sessions    Baseline 07/25/20;    Time 1    Period Weeks    Status Achieved      SLP LONG TERM GOAL #3   Title Pt will write 3 word phrases with less than 2 errors with rare min A over 3 sessions    Status Not Met      SLP LONG TERM GOAL #4   Title Reading assessment if indicated    Status Deferred      SLP LONG TERM GOAL #5  Title Dustin Schneider will use multimodal communication spontaneously  (writing, gesture, device) to augment verbal expression as needed 3/5 times with rare min A    Time 3    Period Weeks    Status On-going      SLP LONG TERM GOAL #6   Title Dustin Schneider will write (approximate) 4 items in personally relevant category that is understood by family/friends    Baseline 08/22/20; 08/31/20; 09/06/19    Time 4    Period Weeks    Status Achieved      SLP LONG TERM GOAL #7   Title Pt will employ multimodal communication to participate in 6 turns in conversation with extended time and occasional min A    Time 4    Period Weeks    Status On-going            Plan - 09/19/20 0928    Clinical Impression Statement Dustin Schneider exhibits moderate Broca's aphasia and severe verbal apraxia impacting communication of wants/needs, with improvements noted during ST sessions. 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 SLP targeted multimodal communication of pre-scripted responses to optimize expressive verbal output. Pt would continue to benefit from skilled ST intervention to increase QOL and effective communication    Speech Therapy Frequency 2x / week    Treatment/Interventions Compensatory strategies;Cueing hierarchy;Functional tasks;Patient/family education;Multimodal communcation approach;SLP instruction and feedback    Potential to Achieve Goals Good    Potential Considerations  Severity of impairments    SLP Home Exercise Plan Use written communication, make pre-scripted phrases, Facetime    Consulted and Agree with Plan of Care Patient;Family member/caregiver           Patient will benefit from skilled therapeutic intervention in order to improve the following deficits and impairments:   Aphasia  Verbal apraxia    Problem List Patient Active Problem List   Diagnosis Date Noted  . Benign essential HTN 06/09/2020  . Abnormality of gait 04/28/2020  . Neurogenic bladder 03/17/2020  . Sepsis due to pneumonia ([place]) 03/06/2020  . History of stroke with residual deficit 03/06/2020  . Transient hypotension 03/06/2020  . Spastic hemiplegia affecting nondominant side ([place])   . History of hypertension   . Expressive aphasia   . AKI (acute kidney injury) ([place])   . Global aphasia   . Hypoalbuminemia due to protein-calorie malnutrition ([place])   . Dysphagia, post-stroke   . Hyperlipidemia 02/02/2020  . Dysphagia following cerebral infarction 02/02/2020  . Aspiration pneumonia ([place]) 02/02/2020  . Acute ischemic left MCA stroke ([place]) 02/02/2020  . Acute respiratory failure ([place])   . Acute ischemic stroke [facility]) s/p clot retrieval L MCA & ACA A3 01/27/2020  . Aspiration into airway 01/27/2020  . Chronic anticoagulation 01/27/2020  . Paroxysmal atrial fibrillation ([place]) 06/15/2018  . Essential hypertension 06/15/2018  . NICM (nonischemic cardiomyopathy) ([place]) 06/15/2018  . Visit for monitoring Tikosyn therapy 06/12/2018   This session documented by Alinda Deem, MA CCC-SLP; however, treatment provided by Georgiann Hahn MS CCC-SLP  Alinda Deem 09/19/2020, 9:51 AM  [facility] [address] [place], [place], [address] Phone: [phone]   Fax:  [phone]   Name: Dustin Schneider MRN: [id number] Date of Birth: [birth year]

## 2020-09-20 ENCOUNTER — Ambulatory Visit: Payer: Self-pay

## 2020-09-22 ENCOUNTER — Other Ambulatory Visit: Payer: Self-pay

## 2020-09-22 ENCOUNTER — Encounter: Payer: Self-pay | Admitting: Physical Medicine & Rehabilitation

## 2020-09-22 ENCOUNTER — Encounter
Payer: BC Managed Care – PPO | Attending: Physical Medicine & Rehabilitation | Admitting: Physical Medicine & Rehabilitation

## 2020-09-22 VITALS — BP 138/81 | HR 54 | Temp 98.8°F | Ht 68.0 in | Wt 159.0 lb

## 2020-09-22 DIAGNOSIS — G8110 Spastic hemiplegia affecting unspecified side: Secondary | ICD-10-CM | POA: Insufficient documentation

## 2020-09-22 NOTE — Progress Notes (Signed)
[facility]: Procedure Note Patient Name: GEDEON BRANDOW DOB: Oct 14, 1955 MRN: [id number] Date: 09/22/2020  Procedure: Botulinum toxin administration Guidance: EMG Diagnosis: Nondominant left spastic hemiparesis with dysesthesias Attending: Delice Lesch, MD    Trade name: Xeomin (incobotulinumtoxinA)  Informed consent: Risks, benefits & options of the procedure are explained to the patient (and/or family). The patient elects to proceed with procedure. Risks include but are not limited to weakness, respiratory distress, dry mouth, ptosis, antibody formation, worsening of some areas of function. Benefits include decreased abnormal muscle tone, improved hygiene and positioning, decreased skin breakdown and, in some cases, decreased pain. Options include conservative management with oral antispasticity agents, phenol chemodenervation of nerve or at motor nerve branches. More invasive options include intrathecal balcofen adminstration for appropriate candidates. Surgical options may include tendon lengthening or transposition or, rarely, dorsal rhizotomy.   History/Physical Examination: 65 y.o. malewith history of PAF/on Xarelto, and ICM presents for hospital follow up for left MCA infarct.  [name]:    Right wrist flexors 0/4, thumb adductor 0/4             Right ankle inverters 1+/4 with sustained clonus Previous Treatments: Therapy/Range of motion Indication for guidance: Target active muscules  Procedure: Botulinum toxin was mixed with preservative free saline with a dilution of 1cc to 100 units. Targeted limb and muscles were identified. The skin was prepped with alcohol swabs and placement of needle tip in targeted muscle was confirmed using appropriate guidance. Prior to injection, positioning of needle tip outside of blood vessel was determined by pulling back on syringe plunger.  MUSCLE UNITS  Patient states he does not want RUE injected, tone has decreased  Right Flexor Hallucis Longus:  35  Flexor Digitorum Longus: 35  Gastroc Med: 30 Total units used: 70  Complications: None  Plan: RTC 6 weeks  Herminio Kniskern Anil Brissia Delisa 10:28 AM

## 2020-09-23 ENCOUNTER — Ambulatory Visit: Payer: BC Managed Care – PPO | Admitting: Physical Therapy

## 2020-09-23 ENCOUNTER — Encounter: Payer: Self-pay | Admitting: Physical Therapy

## 2020-09-23 ENCOUNTER — Ambulatory Visit: Payer: BC Managed Care – PPO

## 2020-09-23 DIAGNOSIS — R2681 Unsteadiness on feet: Secondary | ICD-10-CM

## 2020-09-23 DIAGNOSIS — R262 Difficulty in walking, not elsewhere classified: Secondary | ICD-10-CM

## 2020-09-23 DIAGNOSIS — M6281 Muscle weakness (generalized): Secondary | ICD-10-CM

## 2020-09-23 DIAGNOSIS — R2689 Other abnormalities of gait and mobility: Secondary | ICD-10-CM | POA: Diagnosis not present

## 2020-09-23 DIAGNOSIS — R482 Apraxia: Secondary | ICD-10-CM

## 2020-09-23 DIAGNOSIS — R4701 Aphasia: Secondary | ICD-10-CM

## 2020-09-23 DIAGNOSIS — R29818 Other symptoms and signs involving the nervous system: Secondary | ICD-10-CM

## 2020-09-23 NOTE — Therapy (Signed)
[facility] [address] [place], [place], [address] Phone: [phone]   Fax:  [phone]  Speech Language Pathology Treatment- Renewal Summary  Patient Details  Name: Dustin Schneider MRN: [id number] Date of Birth: 03-24-1956 Referring Provider (SLP): Dr. Delice Lesch   Encounter Date: 09/23/2020   End of Session - 09/23/20 1024    Visit Number 27    Number of Visits 40    Date for SLP Re-Evaluation 11/04/20    SLP Start Time 0932    SLP Stop Time  1015    SLP Time Calculation (min) 43 min    Activity Tolerance Patient tolerated treatment well           Past Medical History:  Diagnosis Date  . Hypertension   . NICM (nonischemic cardiomyopathy) ([place]) 06/15/2018   04/23/18: EF 45%, 09/15/18: NOrmal LVEF  . Paroxysmal atrial fibrillation ([place]) 06/15/2018  . Stroke ([place])   . Visit for monitoring Tikosyn therapy 06/12/2018    Past Surgical History:  Procedure Laterality Date  . BUBBLE STUDY  03/11/2020   Procedure: BUBBLE STUDY;  Surgeon: [name], MD;  Location: [facility];  Service: Cardiovascular;;  . CARDIOVERSION N/A 05/13/2018   Procedure: CARDIOVERSION;  Surgeon: [name], MD;  Location: [facility] ENDOSCOPY;  Service: Cardiovascular;  Laterality: N/A;  . CARDIOVERSION N/A 06/13/2018   Procedure: CARDIOVERSION;  Surgeon: [name], MD;  Location: [facility] ENDOSCOPY;  Service: Cardiovascular;  Laterality: N/A;  . CARDIOVERSION N/A 06/23/2018   Procedure: CARDIOVERSION;  Surgeon: [name], MD;  Location: [facility] ENDOSCOPY;  Service: Cardiovascular;  Laterality: N/A;  . CARDIOVERSION N/A 03/11/2020   Procedure: CARDIOVERSION;  Surgeon: [name], MD;  Location: [facility];  Service: Cardiovascular;  Laterality: N/A;  . excision of actinic keratosis    . EYE SURGERY     eye lift  . IR CT HEAD LTD  01/27/2020  . IR PERCUTANEOUS ART THROMBECTOMY/INFUSION INTRACRANIAL INC DIAG ANGIO  01/27/2020      . IR PERCUTANEOUS  ART THROMBECTOMY/INFUSION INTRACRANIAL INC DIAG ANGIO  01/27/2020  . RADIOLOGY WITH ANESTHESIA N/A 01/27/2020   Procedure: IR WITH ANESTHESIA;  Surgeon: Radiologist, Medication, MD;  Location: [facility];  Service: Radiology;  Laterality: N/A;  . TEE WITHOUT CARDIOVERSION N/A 03/11/2020   Procedure: TRANSESOPHAGEAL ECHOCARDIOGRAM (TEE);  Surgeon: [name], MD;  Location: [facility];  Service: Cardiovascular;  Laterality: N/A;  . TONSILLECTOMY    . WISDOM TOOTH EXTRACTION      There were no vitals filed for this visit.   Subjective Assessment - 09/23/20 0935    Subjective "right leg" re: to recetn Botox    Patient is accompained by: Family member   Aid- jamie                ADULT SLP TREATMENT - 09/23/20 0957      General Information   Behavior/Cognition Cooperative;Pleasant mood      Treatment Provided   Treatment provided Cognitive-Linquistic      Cognitive-Linquistic Treatment   Treatment focused on Aphasia;Patient/family/caregiver education    Skilled Treatment Dustin Schneider brought painting to discuss with SLP, with written descriptions to prompt self. Dustin Schneider reported he completed written descriptions independently. Pt verbally ID'd descriptions of painting with MLU of 1-3 words. Intermittent phonemic errors noted, which improved with SLP modeling, phonemic cues, and written first letters. Multimodal communication targeted, with SLP cuing writing with intermittent min A. Dustin Schneider and Dustin Schneider demonstrated use of compensations, including descriptions, cued synonms, and gestures to faciliate  verbal output. Dustin Schneider attends multiple social communication events throughout week, including [employer] aphasia group.      Assessment / Recommendations / Plan   Plan Continue with current plan of care      Progression Toward Goals   Progression toward goals Progressing toward goals            SLP Education - 09/23/20 1024    Person(s) Educated Patient;Caregiver(s)    Methods Explanation;Demonstration;Verbal  cues    Comprehension Verbalized understanding;Returned demonstration;Verbal cues required            SLP Short Term Goals - 09/23/20 1030      SLP SHORT TERM GOAL #1   Title Pt will name family, friends and art items with occasional min A 8/10 with approximations allowed.    Status Partially Met      SLP SHORT TERM GOAL #2   Title Pt will name 7 items in personally relevant categories with occasional min A over 2 sessions    Status Partially Met      SLP SHORT TERM GOAL #3   Title Pt will perform 4 automatic speech tasks with rare min A.    Status Partially Met      SLP SHORT TERM GOAL #4   Title Pt will write personal information (name/address/phone), 5 family/friend/pet names and 7  professional words with occasional min A    Status Not Met      SLP SHORT TERM GOAL #5   Title Pt will use mulitmodal communication (gesture, draw, write 1st letter etc) to augment verbal expression to meet needs at home with rare min A from family.    Status Partially Met            SLP Long Term Goals - 09/23/20 1035      SLP LONG TERM GOAL #1   Title Pt will verbalize 3 words to explain or describe his artwork and hobbies with occasional min A over 3 sessions, allowing for intellgible approximations    Baseline 05-17-20 (Lingraphica cues), 05-24-20 (lingraphica), 05-31-20 (lingraphica); 07/25/20 (written word)    Status Achieved      SLP LONG TERM GOAL #2   Title Pt and family will utilize multimodal compensations for aphasia and verbal apraxia to participate in 3 turns each of conversation with occasional min A over 2 sessions    Baseline 07/25/20;    Time 1    Period Weeks    Status Achieved      SLP LONG TERM GOAL #3   Title Pt will write 3 word phrases with less than 2 errors with rare min A over 3 sessions    Status Not Met      SLP LONG TERM GOAL #4   Title Reading assessment if indicated    Status Deferred      SLP LONG TERM GOAL #5   Title Dustin Schneider will use multimodal  communication spontaneously  (writing, gesture, device) to augment verbal expression as needed 3/5 times with rare min A    Time 5   Goal renewed 09-23-20   Period Weeks    Status On-going      SLP LONG TERM GOAL #6   Title Dustin Schneider will write (approximate) 4 items in personally relevant category that is understood by family/friends    Baseline 08/22/20; 08/31/20; 09/06/19    Time 4    Period Weeks    Status Achieved      SLP LONG TERM GOAL #7   Title Pt will  employ multimodal communication to participate in 6 turns in conversation with extended time and occasional min A    Time 5   Goals renewed 09-23-20   Period Weeks    Status On-going            Plan - 09/23/20 1042    Clinical Impression Statement Dustin Schneider exhibits moderate Broca's aphasia and severe verbal apraxia impacting communication of wants/needs, with steady improvements noted during ST sessions. 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 SLP targeted multimodal communication of pre-scripted responses to optimize expressive verbal output. Pt and caregivers continue to require some assistance for compensatory strategies for multimodal communcation. Pt would continue to benefit from additional ST visits to increase QOL and effective communication    Speech Therapy Frequency 2x / week    Duration Other (comment)   5 weeks or 40 total visits   Treatment/Interventions Compensatory strategies;Cueing hierarchy;Functional tasks;Patient/family education;Multimodal communcation approach;SLP instruction and feedback    Potential to Achieve Goals Good    Potential Considerations Severity of impairments    Consulted and Agree with Plan of Care Patient           Patient will benefit from skilled therapeutic intervention in order to improve the following deficits and impairments:   Aphasia  Verbal apraxia    Problem List Patient Active Problem List   Diagnosis Date Noted  . Benign  essential HTN 06/09/2020  . Abnormality of gait 04/28/2020  . Neurogenic bladder 03/17/2020  . Sepsis due to pneumonia ([place]) 03/06/2020  . History of stroke with residual deficit 03/06/2020  . Transient hypotension 03/06/2020  . Spastic hemiplegia affecting nondominant side ([place])   . History of hypertension   . Expressive aphasia   . AKI (acute kidney injury) ([place])   . Global aphasia   . Hypoalbuminemia due to protein-calorie malnutrition ([place])   . Dysphagia, post-stroke   . Hyperlipidemia 02/02/2020  . Dysphagia following cerebral infarction 02/02/2020  . Aspiration pneumonia ([place]) 02/02/2020  . Acute ischemic left MCA stroke ([place]) 02/02/2020  . Acute respiratory failure ([place])   . Acute ischemic stroke [facility]) s/p clot retrieval L MCA & ACA A3 01/27/2020  . Aspiration into airway 01/27/2020  . Chronic anticoagulation 01/27/2020  . Paroxysmal atrial fibrillation ([place]) 06/15/2018  . Essential hypertension 06/15/2018  . NICM (nonischemic cardiomyopathy) ([place]) 06/15/2018  . Visit for monitoring Tikosyn therapy 06/12/2018    Alinda Deem, MA CCC-SLP 09/23/2020, 10:47 AM  [facility] [address] [place], [place], [address] Phone: [phone]   Fax:  [phone]   Name: NEMESIO CASTRILLON MRN: [id number] Date of Birth: Apr 16, 1956

## 2020-09-23 NOTE — Therapy (Addendum)
[facility] [address] [place], [place], [address] Phone: [phone]   Fax:  [phone]  Physical Therapy Treatment  Patient Details  Name: Dustin Schneider MRN: [id number] Date of Birth: 09-Feb-1956 Referring Provider (PT): Lauraine Rinne, PA-C but followed by Delice Lesch   Encounter Date: 09/23/2020   PT End of Session - 09/23/20 1158    Visit Number 5    Number of Visits 81    Date for PT Re-Evaluation 10/25/20    Authorization Type BCBS    PT Start Time 0847    PT Stop Time 0928    PT Time Calculation (min) 41 min    Equipment Utilized During Treatment Gait belt    Activity Tolerance Patient tolerated treatment well    Behavior During Therapy [facility] for tasks assessed/performed           Past Medical History:  Diagnosis Date  . Hypertension   . NICM (nonischemic cardiomyopathy) ([place]) 06/15/2018   04/23/18: EF 45%, 09/15/18: NOrmal LVEF  . Paroxysmal atrial fibrillation ([place]) 06/15/2018  . Stroke ([place])   . Visit for monitoring Tikosyn therapy 06/12/2018    Past Surgical History:  Procedure Laterality Date  . BUBBLE STUDY  03/11/2020   Procedure: BUBBLE STUDY;  Surgeon: [name], MD;  Location: [facility];  Service: Cardiovascular;;  . CARDIOVERSION N/A 05/13/2018   Procedure: CARDIOVERSION;  Surgeon: [name], MD;  Location[facility] ENDOSCOPY;  Service: Cardiovascular;  Laterality: N/A;  . CARDIOVERSION N/A 06/13/2018   Procedure: CARDIOVERSION;  Surgeon: [name], MD;  Location: [facility] ENDOSCOPY;  Service: Cardiovascular;  Laterality: N/A;  . CARDIOVERSION N/A 06/23/2018   Procedure: CARDIOVERSION;  Surgeon: [name], MD;  Location: [facility] ENDOSCOPY;  Service: Cardiovascular;  Laterality: N/A;  . CARDIOVERSION N/A 03/11/2020   Procedure: CARDIOVERSION;  Surgeon: [name], MD;  Location: [facility];  Service: Cardiovascular;  Laterality: N/A;  . excision of actinic keratosis    . EYE SURGERY     eye  lift  . IR CT HEAD LTD  01/27/2020  . IR PERCUTANEOUS ART THROMBECTOMY/INFUSION INTRACRANIAL INC DIAG ANGIO  01/27/2020      . IR PERCUTANEOUS ART THROMBECTOMY/INFUSION INTRACRANIAL INC DIAG ANGIO  01/27/2020  . RADIOLOGY WITH ANESTHESIA N/A 01/27/2020   Procedure: IR WITH ANESTHESIA;  Surgeon: Radiologist, Medication, MD;  Location: [facility];  Service: Radiology;  Laterality: N/A;  . TEE WITHOUT CARDIOVERSION N/A 03/11/2020   Procedure: TRANSESOPHAGEAL ECHOCARDIOGRAM (TEE);  Surgeon: [name], MD;  Location: [facility];  Service: Cardiovascular;  Laterality: N/A;  . TONSILLECTOMY    . WISDOM TOOTH EXTRACTION      There were no vitals filed for this visit.   Subjective Assessment - 09/23/20 0852    Subjective Got Botox yesterday in his R foot and calf. Walking is going well with the cane.    Pertinent History PMH: HTN, paroxysmal a fib    Limitations Standing;Walking    Patient Stated Goals "wants everything working on R side again"    Currently in Pain? No/denies    Pain Onset 1 to 4 weeks ago                             [facility] Adult PT Treatment/Exercise - 09/23/20 0859      Ambulation/Gait   Ambulation/Gait Yes    Ambulation/Gait Assistance 4: Min guard;4: Min assist;5: Supervision    Ambulation/Gait Assistance Details at beginning of session provided  pt with new tennis balls for RW and new hand strap for hand orthosis. performed gait outdoors over unlevel surfaces (paved/grass) with pt needing supervision and occassional CGA with pt having difficulty with RLE foot clearance or when bumping RLE into RW, needing 2 instances of min A for balance when ambulating over grassy surfaces    Ambulation Distance (Feet) 900 Feet   with RW, 100' with SPC with 6 prongs   Assistive device Straight cane;Rolling walker   with 6 prong tip   Gait Pattern Step-through pattern;Decreased hip/knee flexion - right;Poor foot clearance - right    Ambulation Surface  Level;Indoor;Outdoor;Paved;Grass    Gait Comments stepping over 6 obstacles (staggered) ranging from 1-2" in height with SPC with quad tip x4 reps, pt stepping over with RLE, pt with only one instance today of catching foot on obstacle      Timed Up and Go Test   Normal TUG (seconds) 16.9   with RW, 17.4 with SPC with quad tip                 PT Education - 09/23/20 1158    Education Details progress towards goals, scheduling more visits through march    Person(s) Educated Patient;Caregiver(s)    Methods Explanation    Comprehension Verbalized understanding            PT Short Term Goals - 09/23/20 0903      PT SHORT TERM GOAL #1   Title Pt will decrease TUG from 21 sec to <18 sec for improved balance and functional mobility.    Baseline 08/26/20 21 sec with RW, 16.9 seconds with RW, 17.4 with SPC with quad tip    Time 4    Period Weeks    Status Achieved    Target Date 09/25/20      PT SHORT TERM GOAL #2   Title Pt will ambulate >800' on varied surfaces with RW/right hand grip attachment and AFO supervision for improved short community distances.    Baseline 600' CGA with walker - supervision/CGA at times when ambulating over pavement, was able to ambulate 900'    Time 4    Period Weeks    Status Partially Met    Target Date 09/25/20             PT Long Term Goals - 08/26/20 1000      PT LONG TERM GOAL #1   Title Pt and pt's family members will be independent with final HEP in order to build upon functional gains made in therapy. ALL LTGS DUE3/15/22    Baseline PT continues to update HEP as pt is progressing. Most recent update 08/26/20    Time 8    Period Weeks    Status On-going    Target Date 10/25/20      PT LONG TERM GOAL #2   Title Pt will ambulate 250' with cane and right AFO CGA for improved household mobility on level surfaces.    Time 8    Period Weeks    Status New    Target Date 10/25/20      PT LONG TERM GOAL #3   Title Pt will increase  Berg from 38 to >42/56 for improved balance and decreased fall risk.    Baseline 08/26/20 38/56    Time 8    Period Weeks    Status New    Target Date 10/25/20      PT LONG TERM GOAL #4   Title Pt  will ambulate up/down 2 steps with LRAD supervision for improved access in home to living room    Baseline CGA with RW    Time 8    Period Weeks    Status New    Target Date 10/25/20      PT LONG TERM GOAL #5   Title Pt will increase gait speed from 0.28ms to >0.734m for improved community mobility.    Baseline 08/26/20 0.5942m   Time 8    Period Weeks    Status New    Target Date 10/25/20                 Plan - 09/23/20 1224    Clinical Impression Statement Assessed pt's STGs today. Pt met STG #1 in regards to TUG, performed today in 16.9 seconds with RW and 17.4 seconds with SPC with quad tip (previously 21 seconds), decr pt's risk of falls. Pt partially met STG #2, pt able to ambulate 900' with RW outdoors over paved/grass surfaces, but needs intermittent CGA over paved surfaces due to bumping R foot onto RW and a couple episodes of min A for balance in the grass. Pt is making excellent progress - will continue to progess towards LTGs.    Personal Factors and Comorbidities Comorbidity 3+    Comorbidities dense L MCA CVA, paroxysmal A. fib on chronic Xarelto, nonischemic cardiomyopathy and hypertension.    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, works as an artElectrical engineerolving/Moderate complexity    Rehab Potential Good    PT Frequency 2x / week    PT Duration 8 weeks    PT Treatment/Interventions ADLs/Self Care Home Management;Aquatic Therapy;DME Instruction;Gait training;Stair training;Functional mobility training;Therapeutic activities;Therapeutic  exercise;Electrical Stimulation;Balance training;Neuromuscular re-education;Wheelchair mobility training;Orthotic Fit/Training;Patient/family education;Passive range of motion;Energy conservation    PT Next Visit Plan Are they getting toe cap on new shoes? gait training with cane with obstacles, Try to get more hip adductor activation. RLE foot clearance. stair training,  RLE NMR with decr UE support, balance on compliant surfaces, gait over unlevel surfaces.    PT Home Exercise Plan on 06/28/20 - added standing at count[place]op and stepping LLE out and in    Consulted and Agree with Plan of Care Patient;Family member/caregiver    Family Member Consulted [name]           Patient will benefit from skilled therapeutic intervention in order to improve the following deficits and impairments:  Abnormal gait,Decreased activity tolerance,Decreased balance,Decreased cognition,Decreased mobility,Decreased coordination,Decreased range of motion,Decreased endurance,Decreased strength,Hypomobility,Difficulty walking,Impaired UE functional use,Impaired vision/preception,Postural dysfunction,Impaired sensation  Visit Diagnosis: Other abnormalities of gait and mobility  Muscle weakness (generalized)  Unsteadiness on feet  Other symptoms and signs involving the nervous system  Difficulty in walking, not elsewhere classified     Problem List Patient Active Problem List   Diagnosis Date Noted  . Benign essential HTN 06/09/2020  . Abnormality of gait 04/28/2020  . Neurogenic bladder 03/17/2020  . Sepsis due to pneumonia ([place]7/25/2021  . History of stroke with residual deficit 03/06/2020  . Transient hypotension 03/06/2020  . Spastic hemiplegia affecting nondominant side ([place] . History of hypertension   . Expressive aphasia   . AKI (acute kidney injury) ([place] . Global aphasia   . Hypoalbuminemia due to protein-calorie malnutrition ([place] . Dysphagia, post-stroke   . Hyperlipidemia  02/02/2020  . Dysphagia following cerebral  infarction 02/02/2020  . Aspiration pneumonia ([place]) 02/02/2020  . Acute ischemic left MCA stroke ([place]) 02/02/2020  . Acute respiratory failure ([place])   . Acute ischemic stroke [facility]) s/p clot retrieval L MCA & ACA A3 01/27/2020  . Aspiration into airway 01/27/2020  . Chronic anticoagulation 01/27/2020  . Paroxysmal atrial fibrillation ([place]) 06/15/2018  . Essential hypertension 06/15/2018  . NICM (nonischemic cardiomyopathy) ([place]) 06/15/2018  . Visit for monitoring Tikosyn therapy 06/12/2018    Arliss Journey, PT, DPT  09/23/2020, 12:26 PM  [facility] [address] [place], [place], [address] Phone: [phone]   Fax:  [phone]  Name: Dustin Schneider MRN: [id number] Date of Birth: Mar 10, 1956

## 2020-09-26 ENCOUNTER — Other Ambulatory Visit: Payer: Self-pay

## 2020-09-26 ENCOUNTER — Ambulatory Visit: Payer: BC Managed Care – PPO | Admitting: Physical Therapy

## 2020-09-26 ENCOUNTER — Encounter: Payer: Self-pay | Admitting: Speech Pathology

## 2020-09-26 ENCOUNTER — Encounter: Payer: Self-pay | Admitting: Physical Therapy

## 2020-09-26 ENCOUNTER — Ambulatory Visit: Payer: BC Managed Care – PPO | Admitting: Speech Pathology

## 2020-09-26 DIAGNOSIS — R2689 Other abnormalities of gait and mobility: Secondary | ICD-10-CM | POA: Diagnosis not present

## 2020-09-26 DIAGNOSIS — R4701 Aphasia: Secondary | ICD-10-CM

## 2020-09-26 DIAGNOSIS — R262 Difficulty in walking, not elsewhere classified: Secondary | ICD-10-CM

## 2020-09-26 DIAGNOSIS — R2681 Unsteadiness on feet: Secondary | ICD-10-CM

## 2020-09-26 DIAGNOSIS — R482 Apraxia: Secondary | ICD-10-CM

## 2020-09-26 DIAGNOSIS — M6281 Muscle weakness (generalized): Secondary | ICD-10-CM

## 2020-09-26 DIAGNOSIS — R29818 Other symptoms and signs involving the nervous system: Secondary | ICD-10-CM

## 2020-09-26 NOTE — Therapy (Signed)
[name] [address] [place], [place], [address] Phone: [phone]   Fax:  [phone]  Speech Language Pathology Treatment  Patient Details  Name: Dustin Schneider MRN: [id number] Date of Birth: 1956-02-25 Referring Provider (SLP): Dr. Delice Lesch   Encounter Date: 09/26/2020   End of Session - 09/26/20 1603    Visit Number 28    Number of Visits 40    Date for SLP Re-Evaluation 11/04/20    SLP Start Time 0847    SLP Stop Time  0930    SLP Time Calculation (min) 43 min    Activity Tolerance Patient tolerated treatment well           Past Medical History:  Diagnosis Date  . Hypertension   . NICM (nonischemic cardiomyopathy) ([place]) 06/15/2018   04/23/18: EF 45%, 09/15/18: NOrmal LVEF  . Paroxysmal atrial fibrillation ([place]) 06/15/2018  . Stroke ([place])   . Visit for monitoring Tikosyn therapy 06/12/2018    Past Surgical History:  Procedure Laterality Date  . BUBBLE STUDY  03/11/2020   Procedure: BUBBLE STUDY;  Surgeon: [name], MD;  Location: [facility];  Service: Cardiovascular;;  . CARDIOVERSION N/A 05/13/2018   Procedure: CARDIOVERSION;  Surgeon: [name], MD;  Location: [facility] ENDOSCOPY;  Service: Cardiovascular;  Laterality: N/A;  . CARDIOVERSION N/A 06/13/2018   Procedure: CARDIOVERSION;  Surgeon: [name], MD;  Location: [facility] ENDOSCOPY;  Service: Cardiovascular;  Laterality: N/A;  . CARDIOVERSION N/A 06/23/2018   Procedure: CARDIOVERSION;  Surgeon: [name], MD;  Location: [facility] ENDOSCOPY;  Service: Cardiovascular;  Laterality: N/A;  . CARDIOVERSION N/A 03/11/2020   Procedure: CARDIOVERSION;  Surgeon: [name], MD;  Location: [facility];  Service: Cardiovascular;  Laterality: N/A;  . excision of actinic keratosis    . EYE SURGERY     eye lift  . IR CT HEAD LTD  01/27/2020  . IR PERCUTANEOUS ART THROMBECTOMY/INFUSION INTRACRANIAL INC DIAG ANGIO  01/27/2020      . IR PERCUTANEOUS ART  THROMBECTOMY/INFUSION INTRACRANIAL INC DIAG ANGIO  01/27/2020  . RADIOLOGY WITH ANESTHESIA N/A 01/27/2020   Procedure: IR WITH ANESTHESIA;  Surgeon: Radiologist, Medication, MD;  Location: [facility];  Service: Radiology;  Laterality: N/A;  . TEE WITHOUT CARDIOVERSION N/A 03/11/2020   Procedure: TRANSESOPHAGEAL ECHOCARDIOGRAM (TEE);  Surgeon: [name], MD;  Location: [place] [facility];  Service: Cardiovascular;  Laterality: N/A;  . TONSILLECTOMY    . WISDOM TOOTH EXTRACTION      There were no vitals filed for this visit.   Subjective Assessment - 09/26/20 0855    Subjective I got Botox"    Patient is accompained by: Family member   Dustin Schneider   Currently in Pain? 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 He wrote content words, however no functor words or verbs. 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 Dustin Schneider concerned re: lack of functor words and verbs. Educated her that due to verbal apraxia these are difficult in conversation. Dustin Schneider is to refine his phrases in sentences  with correct syntax for HW. Dustin Schneider has begun to use Face Time for phone conversations with success      Assessment / Recommendations / Plan   Plan Continue with current plan of care      Progression Toward Goals   Progression toward goals Progressing toward goals              SLP Short Term Goals - 09/26/20 1602      SLP SHORT TERM GOAL #1   Title Pt will name family, friends and art items with occasional min A 8/10 with approximations allowed.    Status Partially Met      SLP SHORT TERM GOAL #2   Title Pt  will name 7 items in personally relevant categories with occasional min A over 2 sessions    Status Partially Met      SLP SHORT TERM GOAL #3   Title Pt will perform 4 automatic speech tasks with rare min A.    Status Partially Met      SLP SHORT TERM GOAL #4   Title Pt will write personal information (name/address/phone), 5 family/friend/pet names and 7  professional words with occasional min A    Status Not Met      SLP SHORT TERM GOAL #5   Title Pt will use mulitmodal communication (gesture, draw, write 1st letter etc) to augment verbal expression to meet needs at home with rare min A from family.    Status Partially Met            SLP Long Term Goals - 09/26/20 1602      SLP LONG TERM GOAL #1   Title Pt will verbalize 3 words to explain or describe his artwork and hobbies with occasional min A over 3 sessions, allowing for intellgible approximations    Baseline 05-17-20 (Lingraphica cues), 05-24-20 (lingraphica), 05-31-20 (lingraphica); 07/25/20 (written word)    Status Achieved      SLP LONG TERM GOAL #2   Title Pt and family will utilize multimodal compensations for aphasia and verbal apraxia to participate in 3 turns each of conversation with occasional min A over 2 sessions    Baseline 07/25/20;    Time 1    Period Weeks    Status Achieved      SLP LONG TERM GOAL #3   Title Pt will write 3 word phrases with less than 2 errors with rare min A over 3 sessions    Status Not Met      SLP LONG TERM GOAL #4   Title Reading assessment if indicated    Status Deferred      SLP LONG TERM GOAL #5   Title Dustin Schneider will use multimodal communication spontaneously  (writing, gesture, device) to augment verbal expression as needed 3/5 times with rare min A    Time 4   Goal renewed 09-23-20   Period Weeks    Status On-going      SLP LONG TERM GOAL #6   Title Dustin Schneider will write (approximate) 4 items in personally relevant category that is understood by family/friends    Baseline 08/22/20;  08/31/20; 09/06/19    Time 4    Period Weeks    Status Achieved      SLP LONG TERM GOAL #7   Title Pt will employ multimodal communication to participate in 6 turns in conversation with extended time and occasional min A    Time 4   Goals renewed 09-23-20   Period Weeks  Status On-going            Plan - 09/26/20 1558    Clinical Impression Statement Dustin Schneider exhibits moderate Broca's aphasia and severe verbal apraxia impacting communication of wants/needs, with steady improvements noted during ST sessions. 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 SLP targeted multimodal communication of pre-scripted responses to optimize expressive verbal output. Pt and caregivers continue to require some assistance for compensatory strategies for multimodal communcation. Pt would continue to benefit from additional ST visits to increase QOL and effective communication    Speech Therapy Frequency 2x / week    Duration --   40 visits   Treatment/Interventions Compensatory strategies;Cueing hierarchy;Functional tasks;Patient/family education;Multimodal communcation approach;SLP instruction and feedback    Potential to Achieve Goals Good    Potential Considerations Severity of impairments    SLP Home Exercise Plan Use written communication, make pre-scripted phrases, Facetime           Patient will benefit from skilled therapeutic intervention in order to improve the following deficits and impairments:   Aphasia  Verbal apraxia    Problem List Patient Active Problem List   Diagnosis Date Noted  . Benign essential HTN 06/09/2020  . Abnormality of gait 04/28/2020  . Neurogenic bladder 03/17/2020  . Sepsis due to pneumonia ([place]) 03/06/2020  . History of stroke with residual deficit 03/06/2020  . Transient hypotension 03/06/2020  . Spastic hemiplegia affecting nondominant side ([place])   . History of hypertension   . Expressive aphasia   . AKI  (acute kidney injury) ([place])   . Global aphasia   . Hypoalbuminemia due to protein-calorie malnutrition ([place])   . Dysphagia, post-stroke   . Hyperlipidemia 02/02/2020  . Dysphagia following cerebral infarction 02/02/2020  . Aspiration pneumonia ([place]) 02/02/2020  . Acute ischemic left MCA stroke ([place]) 02/02/2020  . Acute respiratory failure ([place])   . Acute ischemic stroke [facility]) s/p clot retrieval L MCA & ACA A3 01/27/2020  . Aspiration into airway 01/27/2020  . Chronic anticoagulation 01/27/2020  . Paroxysmal atrial fibrillation ([place]) 06/15/2018  . Essential hypertension 06/15/2018  . NICM (nonischemic cardiomyopathy) ([place]) 06/15/2018  . Visit for monitoring Tikosyn therapy 06/12/2018    Rasaan Brotherton, Annye Rusk MS, CCC-SLP 09/26/2020, 4:04 PM  [facility] [address] [place], [place], [address] Phone: [phone]   Fax:  [phone]   Name: Dustin Schneider MRN: [id number] Date of Birth: 1955-10-21

## 2020-09-26 NOTE — Patient Instructions (Signed)
Access Code: [email or web address] URL: [email or web address] Date: 09/26/2020 Prepared by: [name]  Exercises Supine Bridge - 1 x daily - 5 x weekly - 2 sets - 10 reps Supine Hip Internal and External Rotation - 2 x daily - 7 x weekly - 2 sets - 10 reps Clamshell - 1 x daily - 7 x weekly - 3 sets - 10 reps Seated Hip Adduction Isometrics with Ball - 3 x daily - 7 x weekly - 2 sets - 10 reps - 5 hold Side to Side Weight Shift with Counter Support - 2 x daily - 7 x weekly - 2 sets - 10 reps Mini Squat with Counter Support - 2 x daily - 7 x weekly - 2 sets - 10 reps Supine March - 2 x daily - 7 x weekly - 2 sets - 10 reps

## 2020-09-26 NOTE — Therapy (Signed)
[facility] [address] [place], [place], [address] Phone: [phone]   Fax:  [phone]  Physical Therapy Treatment  Patient Details  Name: Dustin Schneider MRN: [id number] Date of Birth: December 07, 1955 Referring Provider (PT): Lauraine Rinne, PA-C but followed by Delice Lesch   Encounter Date: 09/26/2020   PT End of Session - 09/26/20 1027    Visit Number 60    Number of Visits 51    Date for PT Re-Evaluation 10/25/20    Authorization Type BCBS    PT Start Time 0933    PT Stop Time 1015    PT Time Calculation (min) 42 min    Equipment Utilized During Treatment Gait belt    Activity Tolerance Patient tolerated treatment well    Behavior During Therapy [facility] for tasks assessed/performed           Past Medical History:  Diagnosis Date  . Hypertension   . NICM (nonischemic cardiomyopathy) ([place]) 06/15/2018   04/23/18: EF 45%, 09/15/18: NOrmal LVEF  . Paroxysmal atrial fibrillation ([place]) 06/15/2018  . Stroke ([place])   . Visit for monitoring Tikosyn therapy 06/12/2018    Past Surgical History:  Procedure Laterality Date  . BUBBLE STUDY  03/11/2020   Procedure: BUBBLE STUDY;  Surgeon: [name], MD;  Location: [facility];  Service: Cardiovascular;;  . CARDIOVERSION N/A 05/13/2018   Procedure: CARDIOVERSION;  Surgeon: [name], MD;  Location: [facility] ENDOSCOPY;  Service: Cardiovascular;  Laterality: N/A;  . CARDIOVERSION N/A 06/13/2018   Procedure: CARDIOVERSION;  Surgeon: [name], MD;  Location: [facility] ENDOSCOPY;  Service: Cardiovascular;  Laterality: N/A;  . CARDIOVERSION N/A 06/23/2018   Procedure: CARDIOVERSION;  Surgeon: [name], MD;  Location: [facility] ENDOSCOPY;  Service: Cardiovascular;  Laterality: N/A;  . CARDIOVERSION N/A 03/11/2020   Procedure: CARDIOVERSION;  Surgeon: [name], MD;  Location: [place];  Service: Cardiovascular;  Laterality: N/A;  . excision of actinic keratosis    . EYE SURGERY     eye  lift  . IR CT HEAD LTD  01/27/2020  . IR PERCUTANEOUS ART THROMBECTOMY/INFUSION INTRACRANIAL INC DIAG ANGIO  01/27/2020      . IR PERCUTANEOUS ART THROMBECTOMY/INFUSION INTRACRANIAL INC DIAG ANGIO  01/27/2020  . RADIOLOGY WITH ANESTHESIA N/A 01/27/2020   Procedure: IR WITH ANESTHESIA;  Surgeon: Radiologist, Medication, MD;  Location: [facility];  Service: Radiology;  Laterality: N/A;  . TEE WITHOUT CARDIOVERSION N/A 03/11/2020   Procedure: TRANSESOPHAGEAL ECHOCARDIOGRAM (TEE);  Surgeon: [name], MD;  Location: [place];  Service: Cardiovascular;  Laterality: N/A;  . TONSILLECTOMY    . WISDOM TOOTH EXTRACTION      There were no vitals filed for this visit.   Subjective Assessment - 09/26/20 0936    Subjective No changes since he was last here.    Pertinent History PMH: HTN, paroxysmal a fib    Limitations Standing;Walking    Patient Stated Goals "wants everything working on R side again"    Currently in Pain? No/denies    Pain Onset 1 to 4 weeks ago                             [facility] Adult PT Treatment/Exercise - 09/26/20 0001      Ambulation/Gait   Ambulation/Gait Yes    Ambulation/Gait Assistance 4: Min guard;4: Min assist  2 laps with SPC with 6 prong tip with min guard 2 laps around gym.    Ambulation Distance (  Feet) 230 Feet   x1   Assistive device Straight cane;Rolling walker   with 6 prong tip   Gait Pattern Step-through pattern;Decreased hip/knee flexion - right;Poor foot clearance - right    Ambulation Surface Level;Indoor    Ramp --   min guard   Ramp Details (indicate cue type and reason) x4 reps up and down incline with min guard with SPC with 6 prong tip    Curb 4: Min assist    Curb Details (indicate cue type and reason) modified curb with SPC with 6 prong tip - performed on 4" aerobic step with step to pattern leading with cane, then LLE and then RLE x6 reps with pt needing min guard and 2 reps of min A to help with clearing RLE.    Gait Comments  ambulating over unlevel red mat during gait distance of approx. 20' x6 reps total with SPC with 6 prong tip, pt needing min guard/min A for balance and at times for getting RLE stuck when trying to clear mat at times. one episode of mod A for balance when pt trying to step onto mat      Neuro Re-ed    Neuro Re-ed Details  in // bars with LUE support: working on RLE foot clearance stepping onto 4" step with RLE and LLE and then down first with LLE for improved RLE foot clearance x12 reps,pt needing assistance from therapist to help decr circumduction when stepping up and down with RLE      Knee/Hip Exercises: Supine   Bridges Strengthening;AROM    Bridges Limitations performed bridging and then lifting L heel off mat for incr weight bearing through RLE x6 reps, then performed attempting single leg marching bridge with lifting left leg up and back down with therapist needing to provide assist for maintaing R leg in position x5 reps    Other Supine Knee/Hip Exercises supine marching 2 x 10 reps with RLE - with focus on slowed and controlled raising and lowering back down to mat, added to HEP                  PT Education - 09/26/20 1026    Education Details supine marching with control to HEP.    Person(s) Educated Patient;Caregiver(s)    Methods Explanation;Demonstration    Comprehension Verbalized understanding;Returned demonstration            PT Short Term Goals - 09/23/20 0903      PT SHORT TERM GOAL #1   Title Pt will decrease TUG from 21 sec to <18 sec for improved balance and functional mobility.    Baseline 08/26/20 21 sec with RW, 16.9 seconds with RW, 17.4 with SPC with quad tip    Time 4    Period Weeks    Status Achieved    Target Date 09/25/20      PT SHORT TERM GOAL #2   Title Pt will ambulate >800' on varied surfaces with RW/right hand grip attachment and AFO supervision for improved short community distances.    Baseline 600' CGA with walker - supervision/CGA at  times when ambulating over pavement, was able to ambulate 900'    Time 4    Period Weeks    Status Partially Met    Target Date 09/25/20             PT Long Term Goals - 08/26/20 1000      PT LONG TERM GOAL #1   Title Pt and pt's  family members will be independent with final HEP in order to build upon functional gains made in therapy. ALL LTGS DUE3/15/22    Baseline PT continues to update HEP as pt is progressing. Most recent update 08/26/20    Time 8    Period Weeks    Status On-going    Target Date 10/25/20      PT LONG TERM GOAL #2   Title Pt will ambulate 250' with cane and right AFO CGA for improved household mobility on level surfaces.    Time 8    Period Weeks    Status New    Target Date 10/25/20      PT LONG TERM GOAL #3   Title Pt will increase Berg from 38 to >42/56 for improved balance and decreased fall risk.    Baseline 08/26/20 38/56    Time 8    Period Weeks    Status New    Target Date 10/25/20      PT LONG TERM GOAL #4   Title Pt will ambulate up/down 2 steps with LRAD supervision for improved access in home to living room    Baseline CGA with RW    Time 8    Period Weeks    Status New    Target Date 10/25/20      PT LONG TERM GOAL #5   Title Pt will increase gait speed from 0.55ms to >0.757m for improved community mobility.    Baseline 08/26/20 0.5945m   Time 8    Period Weeks    Status New    Target Date 10/25/20                 Plan - 09/26/20 1052    Clinical Impression Statement Today's skilled session focused on gait training with SPC with quad tip, BLE strengthening, and RLE foot clearance. Pt needing min guard when ambulating up/down small incline and tried ambulating over an unlevel surface with SPC with 6 prong tip with pt needing min guard/min A and one episode of mod A for balance when pt trying to clear RLE to step onto compliant surface. Will continue to progress towards LTGs.    Personal Factors and Comorbidities  Comorbidity 3+    Comorbidities dense L MCA CVA, paroxysmal A. fib on chronic Xarelto, nonischemic cardiomyopathy and hypertension.    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, works as an artElectrical engineerolving/Moderate complexity    Rehab Potential Good    PT Frequency 2x / week    PT Duration 8 weeks    PT Treatment/Interventions ADLs/Self Care Home Management;Aquatic Therapy;DME Instruction;Gait training;Stair training;Functional mobility training;Therapeutic activities;Therapeutic exercise;Electrical Stimulation;Balance training;Neuromuscular re-education;Wheelchair mobility training;Orthotic Fit/Training;Patient/family education;Passive range of motion;Energy conservation    PT Next Visit Plan Are they getting toe cap on new shoes? gait training with cane with obstacles, Try to get more hip adductor activation. RLE foot clearance. stair training,  RLE NMR with decr UE support, balance on compliant surfaces, gait over unlevel surfaces.    PT Home Exercise Plan on 06/28/20 - added standing at countetop and stepping LLE out and in    Consulted and Agree with Plan of Care Patient;Family member/caregiver    Family Member Consulted [name]           Patient will benefit from skilled therapeutic intervention in order to improve the following deficits and impairments:  Abnormal gait,Decreased activity  tolerance,Decreased balance,Decreased cognition,Decreased mobility,Decreased coordination,Decreased range of motion,Decreased endurance,Decreased strength,Hypomobility,Difficulty walking,Impaired UE functional use,Impaired vision/preception,Postural dysfunction,Impaired sensation  Visit Diagnosis: Other abnormalities of gait and mobility  Muscle weakness  (generalized)  Unsteadiness on feet  Other symptoms and signs involving the nervous system  Difficulty in walking, not elsewhere classified     Problem List Patient Active Problem List   Diagnosis Date Noted  . Benign essential HTN 06/09/2020  . Abnormality of gait 04/28/2020  . Neurogenic bladder 03/17/2020  . Sepsis due to pneumonia ([place]) 03/06/2020  . History of stroke with residual deficit 03/06/2020  . Transient hypotension 03/06/2020  . Spastic hemiplegia affecting nondominant side ([place])   . History of hypertension   . Expressive aphasia   . AKI (acute kidney injury) ([place])   . Global aphasia   . Hypoalbuminemia due to protein-calorie malnutrition ([place])   . Dysphagia, post-stroke   . Hyperlipidemia 02/02/2020  . Dysphagia following cerebral infarction 02/02/2020  . Aspiration pneumonia ([place]) 02/02/2020  . Acute ischemic left MCA stroke ([place]) 02/02/2020  . Acute respiratory failure ([place])   . Acute ischemic stroke [facility]) s/p clot retrieval L MCA & ACA A3 01/27/2020  . Aspiration into airway 01/27/2020  . Chronic anticoagulation 01/27/2020  . Paroxysmal atrial fibrillation ([place]) 06/15/2018  . Essential hypertension 06/15/2018  . NICM (nonischemic cardiomyopathy) ([place]) 06/15/2018  . Visit for monitoring Tikosyn therapy 06/12/2018    Arliss Journey, PT, DPT  09/26/2020, 10:57 AM  [facility] [address] [place], [place], [address] Phone: [phone]   Fax:  [phone]  Name: Dustin Schneider MRN: [id number] Date of Birth: 15-Aug-1955

## 2020-09-27 ENCOUNTER — Ambulatory Visit: Payer: Self-pay

## 2020-09-29 ENCOUNTER — Ambulatory Visit: Payer: Self-pay | Admitting: Physical Therapy

## 2020-09-30 ENCOUNTER — Encounter: Payer: Self-pay | Admitting: Speech Pathology

## 2020-09-30 ENCOUNTER — Ambulatory Visit: Payer: BC Managed Care – PPO | Admitting: Speech Pathology

## 2020-09-30 ENCOUNTER — Ambulatory Visit: Payer: BC Managed Care – PPO | Admitting: Physical Therapy

## 2020-09-30 ENCOUNTER — Encounter: Payer: Self-pay | Admitting: Physical Therapy

## 2020-09-30 ENCOUNTER — Other Ambulatory Visit: Payer: Self-pay

## 2020-09-30 DIAGNOSIS — R4701 Aphasia: Secondary | ICD-10-CM

## 2020-09-30 DIAGNOSIS — R2681 Unsteadiness on feet: Secondary | ICD-10-CM

## 2020-09-30 DIAGNOSIS — M6281 Muscle weakness (generalized): Secondary | ICD-10-CM

## 2020-09-30 DIAGNOSIS — R2689 Other abnormalities of gait and mobility: Secondary | ICD-10-CM | POA: Diagnosis not present

## 2020-09-30 DIAGNOSIS — R29818 Other symptoms and signs involving the nervous system: Secondary | ICD-10-CM

## 2020-09-30 DIAGNOSIS — R482 Apraxia: Secondary | ICD-10-CM

## 2020-09-30 NOTE — Therapy (Signed)
[place] [address] [place], [place], [address] Phone: [phone]   Fax:  [phone]  Physical Therapy Treatment  Patient Details  Name: Dustin Schneider MRN: [id number] Date of Birth: 11-28-1955 Referring Provider (PT): Lauraine Rinne, PA-C but followed by Delice Lesch   Encounter Date: 09/30/2020   PT End of Session - 09/30/20 1228    Visit Number 5    Number of Visits 39    Date for PT Re-Evaluation 10/25/20    Authorization Type BCBS    PT Start Time 0846    PT Stop Time 0928    PT Time Calculation (min) 42 min    Equipment Utilized During Treatment Gait belt    Activity Tolerance Patient tolerated treatment well    Behavior During Therapy [facility] for tasks assessed/performed           Past Medical History:  Diagnosis Date  . Hypertension   . NICM (nonischemic cardiomyopathy) ([place]) 06/15/2018   04/23/18: EF 45%, 09/15/18: NOrmal LVEF  . Paroxysmal atrial fibrillation ([place]) 06/15/2018  . Stroke ([place])   . Visit for monitoring Tikosyn therapy 06/12/2018    Past Surgical History:  Procedure Laterality Date  . BUBBLE STUDY  03/11/2020   Procedure: BUBBLE STUDY;  Surgeon: [name], MD;  Location: [place];  Service: Cardiovascular;;  . CARDIOVERSION N/A 05/13/2018   Procedure: CARDIOVERSION;  Surgeon: [name], MD;  Location: [facility] ENDOSCOPY;  Service: Cardiovascular;  Laterality: N/A;  . CARDIOVERSION N/A 06/13/2018   Procedure: CARDIOVERSION;  Surgeon: [name], MD;  Location: [facility] ENDOSCOPY;  Service: Cardiovascular;  Laterality: N/A;  . CARDIOVERSION N/A 06/23/2018   Procedure: CARDIOVERSION;  Surgeon: [name], MD;  Location: [facility] ENDOSCOPY;  Service: Cardiovascular;  Laterality: N/A;  . CARDIOVERSION N/A 03/11/2020   Procedure: CARDIOVERSION;  Surgeon: [name], MD;  Location: [place];  Service: Cardiovascular;  Laterality: N/A;  . excision of actinic keratosis    . EYE SURGERY     eye  lift  . IR CT HEAD LTD  01/27/2020  . IR PERCUTANEOUS ART THROMBECTOMY/INFUSION INTRACRANIAL INC DIAG ANGIO  01/27/2020      . IR PERCUTANEOUS ART THROMBECTOMY/INFUSION INTRACRANIAL INC DIAG ANGIO  01/27/2020  . RADIOLOGY WITH ANESTHESIA N/A 01/27/2020   Procedure: IR WITH ANESTHESIA;  Surgeon: Radiologist, Medication, MD;  Location: [place];  Service: Radiology;  Laterality: N/A;  . TEE WITHOUT CARDIOVERSION N/A 03/11/2020   Procedure: TRANSESOPHAGEAL ECHOCARDIOGRAM (TEE);  Surgeon: [name], MD;  Location: [place];  Service: Cardiovascular;  Laterality: N/A;  . TONSILLECTOMY    . WISDOM TOOTH EXTRACTION      There were no vitals filed for this visit.   Subjective Assessment - 09/30/20 0851    Subjective Is improving. Can now put on both shoes and socks and AFO by himself, just needs someone to tie his shoes. Got the toe cap on his new shoes, did not bring them today but will wear them next time.    Pertinent History PMH: HTN, paroxysmal a fib    Limitations Standing;Walking    Patient Stated Goals "wants everything working on R side again"    Currently in Pain? No/denies    Pain Onset 1 to 4 weeks ago                             [facility] Adult PT Treatment/Exercise - 09/30/20 0904      Ambulation/Gait  Ambulation/Gait Yes    Ambulation/Gait Assistance 4: Min guard;4: Min assist    Ambulation/Gait Assistance Details from waiting room ambulated with pt back to gym with [facility] with 6 prong tip and to speech therapy room at end of session with min guard. (pt ambulating into waiting room with RW and out of speech with RW)    Ambulation Distance (Feet) 115 Feet   x2   Assistive device Straight cane;Rolling walker   with 6 prong tip   Gait Pattern Step-through pattern;Decreased hip/knee flexion - right;Poor foot clearance - right    Ambulation Surface Level;Indoor    Gait Comments ambulating over 6 obstacles ranging up to 2" in height, stepping over with RLE for increased  hip flexion/foot clearance, pt with only 2 instances of almost catching R foot, x8 reps total. attempted to perform one rep with a 4" obstacle, however too high for pt and pt compensating with circumduction      Knee/Hip Exercises: Aerobic   Stepper SciFit with BLE and LUE only x6 minutes at gear 3.0, verbal and tactile cues for RLE to be in proper alignment      Knee/Hip Exercises: Standing   Hip Flexion Stengthening;Right;1 set;10 reps;Knee bent    Hip Flexion Limitations with yellow tband around distal thigh and single LUE support on chair, performed x10 reps with cues for posture, then performed an additional x5 reps, stopped at 5 reps due to fatigue.      Knee/Hip Exercises: Supine   Other Supine Knee/Hip Exercises supine hip flexion in hooklying position with RLE - performed with therapist resistance into hip flexion and resistance when pt lowering back down to mat for control x10 reps, pt's caregiver Roselyn Reef performed x5 reps with pt for caregiver to perform at home with pt - verbal and demo cues on proper technique                    PT Short Term Goals - 09/23/20 0903      PT SHORT TERM GOAL #1   Title Pt will decrease TUG from 21 sec to <18 sec for improved balance and functional mobility.    Baseline 08/26/20 21 sec with RW, 16.9 seconds with RW, 17.4 with SPC with quad tip    Time 4    Period Weeks    Status Achieved    Target Date 09/25/20      PT SHORT TERM GOAL #2   Title Pt will ambulate >800' on varied surfaces with RW/right hand grip attachment and AFO supervision for improved short community distances.    Baseline 600' CGA with walker - supervision/CGA at times when ambulating over pavement, was able to ambulate 900'    Time 4    Period Weeks    Status Partially Met    Target Date 09/25/20             PT Long Term Goals - 08/26/20 1000      PT LONG TERM GOAL #1   Title Pt and pt's family members will be independent with final HEP in order to build  upon functional gains made in therapy. ALL LTGS DUE3/15/22    Baseline PT continues to update HEP as pt is progressing. Most recent update 08/26/20    Time 8    Period Weeks    Status On-going    Target Date 10/25/20      PT LONG TERM GOAL #2   Title Pt will ambulate 250' with cane and right  AFO CGA for improved household mobility on level surfaces.    Time 8    Period Weeks    Status New    Target Date 10/25/20      PT LONG TERM GOAL #3   Title Pt will increase Berg from 38 to >42/56 for improved balance and decreased fall risk.    Baseline 08/26/20 38/56    Time 8    Period Weeks    Status New    Target Date 10/25/20      PT LONG TERM GOAL #4   Title Pt will ambulate up/down 2 steps with LRAD supervision for improved access in home to living room    Baseline CGA with RW    Time 8    Period Weeks    Status New    Target Date 10/25/20      PT LONG TERM GOAL #5   Title Pt will increase gait speed from 0.15ms to >0.760m for improved community mobility.    Baseline 08/26/20 0.5979m   Time 8    Period Weeks    Status New    Target Date 10/25/20                 Plan - 09/30/20 1301    Clinical Impression Statement Focused on obstacle negotiation with [facility] 6 prong tip and use of RLE to clear obstacles. Pt able to clear obstacles up to 2" with RLE with hip/knee flexion, but when attempting to clear a 4" obstacle, pt performing with incr compensatory movements today. Remainder of session focused on BLE strengthening with emphasis on R hip flexion in supine and in standing - with therapist and yellow tband resistance. Will continue to progress towards LTGs.    Personal Factors and Comorbidities Comorbidity 3+    Comorbidities dense L MCA CVA, paroxysmal A. fib on chronic Xarelto, nonischemic cardiomyopathy and hypertension.    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 R hip flexion strengthening. gait training with cane with obstacles,  RLE foot clearance. stair training,  RLE NMR with decr UE support, balance on compliant surfaces, gait over unlevel surfaces.    PT Home Exercise Plan on 06/28/20 - added standing at countetop and stepping LLE out and in    Consulted and Agree with Plan of Care Patient;Family member/caregiver    Family Member Consulted [name]           Patient will benefit from skilled therapeutic intervention in order to improve the following deficits and impairments:  Abnormal gait,Decreased activity tolerance,Decreased balance,Decreased cognition,Decreased mobility,Decreased coordination,Decreased range of motion,Decreased endurance,Decreased strength,Hypomobility,Difficulty walking,Impaired UE functional use,Impaired vision/preception,Postural dysfunction,Impaired sensation  Visit Diagnosis: Other abnormalities of gait and mobility  Muscle weakness (generalized)  Unsteadiness on feet  Other symptoms and signs involving the nervous system     Problem List Patient Active Problem List   Diagnosis Date Noted  . Benign essential HTN 06/09/2020  . Abnormality of gait 04/28/2020  . Neurogenic bladder 03/17/2020  .  Sepsis due to pneumonia  ([place]) 03/06/2020  . History of stroke with residual deficit 03/06/2020  . Transient hypotension 03/06/2020  . Spastic hemiplegia affecting nondominant side ([place])   . History of hypertension   . Expressive aphasia   . AKI (acute kidney injury) ([place])   . Global aphasia   . Hypoalbuminemia due to protein-calorie malnutrition ([place])   . Dysphagia, post-stroke   . Hyperlipidemia 02/02/2020  . Dysphagia following cerebral infarction 02/02/2020  . Aspiration pneumonia ([place]) 02/02/2020  . Acute ischemic left MCA stroke ([place]) 02/02/2020  . Acute respiratory failure ([place])   . Acute ischemic stroke [facility]) s/p clot retrieval L MCA & ACA A3 01/27/2020  . Aspiration into airway 01/27/2020  . Chronic anticoagulation 01/27/2020  . Paroxysmal atrial fibrillation ([place]) 06/15/2018  . Essential hypertension 06/15/2018  . NICM (nonischemic cardiomyopathy) ([place]) 06/15/2018  . Visit for monitoring Tikosyn therapy 06/12/2018    Arliss Journey, PT, DPT 09/30/2020, 1:03 PM  [place] [address] [place], [place], [address] Phone: [phone]   Fax:  [phone]  Name: Dustin Schneider MRN: [id number] Date of Birth: 01/10/1956

## 2020-09-30 NOTE — Therapy (Signed)
[facility] [address] [place], [place], [address] Phone: [phone]   Fax:  [phone]  Speech Language Pathology Treatment  Patient Details  Name: Dustin Schneider MRN: [id number] Date of Birth: [birth year] Referring Provider (SLP): Dr. Delice Lesch   Encounter Date: 09/30/2020   End of Session - 09/30/20 1255    Visit Number 29    Number of Visits 40    Date for SLP Re-Evaluation 11/04/20    SLP Start Time 0932    SLP Stop Time  6767    SLP Time Calculation (min) 42 min    Activity Tolerance Patient tolerated treatment well           Past Medical History:  Diagnosis Date  . Hypertension   . NICM (nonischemic cardiomyopathy) ([place]) 06/15/2018   04/23/18: EF 45%, 09/15/18: NOrmal LVEF  . Paroxysmal atrial fibrillation ([place]) 06/15/2018  . Stroke ([place])   . Visit for monitoring Tikosyn therapy 06/12/2018    Past Surgical History:  Procedure Laterality Date  . BUBBLE STUDY  03/11/2020   Procedure: BUBBLE STUDY;  Surgeon: [name], MD;  Location: [place];  Service: Cardiovascular;;  . CARDIOVERSION N/A 05/13/2018   Procedure: CARDIOVERSION;  Surgeon: [name], MD;  Location: [facility] ENDOSCOPY;  Service: Cardiovascular;  Laterality: N/A;  . CARDIOVERSION N/A 06/13/2018   Procedure: CARDIOVERSION;  Surgeon: [name], MD;  Location: [facility] ENDOSCOPY;  Service: Cardiovascular;  Laterality: N/A;  . CARDIOVERSION N/A 06/23/2018   Procedure: CARDIOVERSION;  Surgeon: [name], MD;  Location: [facility] ENDOSCOPY;  Service: Cardiovascular;  Laterality: N/A;  . CARDIOVERSION N/A 03/11/2020   Procedure: CARDIOVERSION;  Surgeon: [name], MD;  Location: [place];  Service: Cardiovascular;  Laterality: N/A;  . excision of actinic keratosis    . EYE SURGERY     eye lift  . IR CT HEAD LTD  01/27/2020  . IR PERCUTANEOUS ART THROMBECTOMY/INFUSION INTRACRANIAL INC DIAG ANGIO  01/27/2020      . IR PERCUTANEOUS ART  THROMBECTOMY/INFUSION INTRACRANIAL INC DIAG ANGIO  01/27/2020  . RADIOLOGY WITH ANESTHESIA N/A 01/27/2020   Procedure: IR WITH ANESTHESIA;  Surgeon: Radiologist, Medication, MD;  Location: [place];  Service: Radiology;  Laterality: N/A;  . TEE WITHOUT CARDIOVERSION N/A 03/11/2020   Procedure: TRANSESOPHAGEAL ECHOCARDIOGRAM (TEE);  Surgeon: [name], MD;  Location: [place];  Service: Cardiovascular;  Laterality: N/A;  . TONSILLECTOMY    . WISDOM TOOTH EXTRACTION      There were no vitals filed for this visit.   Subjective Assessment - 09/30/20 0930    Subjective "We did some HW"    Patient is accompained by: Andris Baumann, caregiver   Currently in Pain? No/denies                 ADULT SLP TREATMENT - 09/30/20 0947      General Information   Behavior/Cognition Cooperative;Pleasant mood      Treatment Provided   Treatment provided Cognitive-Linquistic      Cognitive-Linquistic Treatment   Treatment focused on Aphasia;Patient/family/caregiver education    Skilled Treatment Dustin Schneider brought in [facility] of written sentences with intact syntax with help from Jeral Fruit (friend). He approximated sentences reading aloud. Dustin Schneider took 6 turns in conversation using multimodal communication to communicate that he saw the [employer] worker who helped him when he had his stroke at [employer] yesterday and he became tearful. He was able to relay that he was with Jody to buy birdseed, he went to the bathroom,  finished then had a stroke when Legrand Como at [employer] helped him. 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 He required occasional cues to modify gestures to improve accuracy. Jim requests to continue to work on conversation rather than structured speech/language tasks      Assessment / Recommendations / Plan   Plan Continue with current plan of care      Progression Toward Goals   Progression toward goals Progressing toward goals              SLP Short Term Goals -  09/30/20 1255      SLP SHORT TERM GOAL #1   Title Pt will name family, friends and art items with occasional min A 8/10 with approximations allowed.    Status Partially Met      SLP SHORT TERM GOAL #2   Title Pt will name 7 items in personally relevant categories with occasional min A over 2 sessions    Status Partially Met      SLP SHORT TERM GOAL #3   Title Pt will perform 4 automatic speech tasks with rare min A.    Status Partially Met      SLP SHORT TERM GOAL #4   Title Pt will write personal information (name/address/phone), 5 family/friend/pet names and 7  professional words with occasional min A    Status Not Met      SLP SHORT TERM GOAL #5   Title Pt will use mulitmodal communication (gesture, draw, write 1st letter etc) to augment verbal expression to meet needs at home with rare min A from family.    Status Partially Met            SLP Long Term Goals - 09/30/20 1255      SLP LONG TERM GOAL #1   Title Pt will verbalize 3 words to explain or describe his artwork and hobbies with occasional min A over 3 sessions, allowing for intellgible approximations    Baseline 05-17-20 (Lingraphica cues), 05-24-20 (lingraphica), 05-31-20 (lingraphica); 07/25/20 (written word)    Status Achieved      SLP LONG TERM GOAL #2   Title Pt and family will utilize multimodal compensations for aphasia and verbal apraxia to participate in 3 turns each of conversation with occasional min A over 2 sessions    Baseline 07/25/20;    Time 1    Period Weeks    Status Achieved      SLP LONG TERM GOAL #3   Title Pt will write 3 word phrases with less than 2 errors with rare min A over 3 sessions    Status Not Met      SLP LONG TERM GOAL #4   Title Reading assessment if indicated    Status Deferred      SLP LONG TERM GOAL #5   Title Dustin Schneider will use multimodal communication spontaneously  (writing, gesture, device) to augment verbal expression as needed 3/5 times with rare min A    Time 4   Goal  renewed 09-23-20   Period Weeks    Status On-going      SLP LONG TERM GOAL #6   Title Dustin Schneider will write (approximate) 4 items in personally relevant category that is understood by family/friends    Baseline 08/22/20; 08/31/20; 09/06/19    Time 4    Period Weeks    Status Achieved      SLP LONG TERM GOAL #7   Title Pt will employ multimodal  communication to participate in 6 turns in conversation with extended time and occasional min A    Time 4   Goals renewed 09-23-20   Period Weeks    Status On-going            Plan - 09/30/20 1254    Clinical Impression Statement Dustin Schneider exhibits moderate Broca's aphasia and severe verbal apraxia impacting communication of wants/needs, with steady improvements noted during ST sessions. 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 SLP targeted multimodal communication of pre-scripted responses to optimize expressive verbal output. Pt and caregivers continue to require some assistance for compensatory strategies for multimodal communcation. Pt would continue to benefit from additional ST visits to increase QOL and effective communication    Speech Therapy Frequency 2x / week    Duration --   40 visits   Treatment/Interventions Compensatory strategies;Cueing hierarchy;Functional tasks;Patient/family education;Multimodal communcation approach;SLP instruction and feedback           Patient will benefit from skilled therapeutic intervention in order to improve the following deficits and impairments:   Aphasia  Verbal apraxia    Problem List Patient Active Problem List   Diagnosis Date Noted  . Benign essential HTN 06/09/2020  . Abnormality of gait 04/28/2020  . Neurogenic bladder 03/17/2020  . Sepsis due to pneumonia ([place]) 03/06/2020  . History of stroke with residual deficit 03/06/2020  . Transient hypotension 03/06/2020  . Spastic hemiplegia affecting nondominant side ([place])   . History of  hypertension   . Expressive aphasia   . AKI (acute kidney injury) ([place])   . Global aphasia   . Hypoalbuminemia due to protein-calorie malnutrition ([place])   . Dysphagia, post-stroke   . Hyperlipidemia 02/02/2020  . Dysphagia following cerebral infarction 02/02/2020  . Aspiration pneumonia ([place]) 02/02/2020  . Acute ischemic left MCA stroke ([place]) 02/02/2020  . Acute respiratory failure ([place])   . Acute ischemic stroke [facility]) s/p clot retrieval L MCA & ACA A3 01/27/2020  . Aspiration into airway 01/27/2020  . Chronic anticoagulation 01/27/2020  . Paroxysmal atrial fibrillation ([place]) 06/15/2018  . Essential hypertension 06/15/2018  . NICM (nonischemic cardiomyopathy) ([place]) 06/15/2018  . Visit for monitoring Tikosyn therapy 06/12/2018    Shavaun Osterloh, Annye Rusk MS, CCC-SLP 09/30/2020, 12:55 PM  [place] [address] [place], [place], [address] Phone: [phone]   Fax:  [phone]   Name: Dustin Schneider MRN: [id number] Date of Birth: Mar 14, 1956

## 2020-10-05 ENCOUNTER — Ambulatory Visit: Payer: Self-pay | Admitting: Physical Therapy

## 2020-10-05 ENCOUNTER — Ambulatory Visit: Payer: BC Managed Care – PPO | Admitting: Speech Pathology

## 2020-10-05 ENCOUNTER — Ambulatory Visit: Payer: BC Managed Care – PPO | Admitting: Physical Therapy

## 2020-10-05 ENCOUNTER — Other Ambulatory Visit: Payer: Self-pay

## 2020-10-05 ENCOUNTER — Encounter: Payer: Self-pay | Admitting: Physical Therapy

## 2020-10-05 DIAGNOSIS — R29818 Other symptoms and signs involving the nervous system: Secondary | ICD-10-CM

## 2020-10-05 DIAGNOSIS — R482 Apraxia: Secondary | ICD-10-CM

## 2020-10-05 DIAGNOSIS — M6281 Muscle weakness (generalized): Secondary | ICD-10-CM

## 2020-10-05 DIAGNOSIS — R2689 Other abnormalities of gait and mobility: Secondary | ICD-10-CM | POA: Diagnosis not present

## 2020-10-05 DIAGNOSIS — R4701 Aphasia: Secondary | ICD-10-CM

## 2020-10-05 DIAGNOSIS — R2681 Unsteadiness on feet: Secondary | ICD-10-CM

## 2020-10-05 NOTE — Therapy (Signed)
[facility] [address] [place], [place], [address] Phone: [phone]   Fax:  [phone]  Physical Therapy Treatment  Patient Details  Name: Dustin Schneider MRN: [id number] Date of Birth: [birth year] Referring Provider (PT): Lauraine Rinne, PA-C but followed by Delice Lesch   Encounter Date: 10/05/2020   PT End of Session - 10/05/20 1214    Visit Number 70    Number of Visits 95    Date for PT Re-Evaluation 10/25/20    Authorization Type BCBS    PT Start Time 0932    PT Stop Time 1016    PT Time Calculation (min) 44 min    Equipment Utilized During Treatment Gait belt    Activity Tolerance Patient tolerated treatment well    Behavior During Therapy [facility] for tasks assessed/performed           Past Medical History:  Diagnosis Date  . Hypertension   . NICM (nonischemic cardiomyopathy) ([place]) 06/15/2018   04/23/18: EF 45%, 09/15/18: NOrmal LVEF  . Paroxysmal atrial fibrillation ([place]) 06/15/2018  . Stroke ([place])   . Visit for monitoring Tikosyn therapy 06/12/2018    Past Surgical History:  Procedure Laterality Date  . BUBBLE STUDY  03/11/2020   Procedure: BUBBLE STUDY;  Surgeon: [name], MD;  Location: [place];  Service: Cardiovascular;;  . CARDIOVERSION N/A 05/13/2018   Procedure: CARDIOVERSION;  Surgeon: [name], MD;  Location: [facility] ENDOSCOPY;  Service: Cardiovascular;  Laterality: N/A;  . CARDIOVERSION N/A 06/13/2018   Procedure: CARDIOVERSION;  Surgeon: [name], MD;  Location: [facility] ENDOSCOPY;  Service: Cardiovascular;  Laterality: N/A;  . CARDIOVERSION N/A 06/23/2018   Procedure: CARDIOVERSION;  Surgeon: [name], MD;  Location: [facility] ENDOSCOPY;  Service: Cardiovascular;  Laterality: N/A;  . CARDIOVERSION N/A 03/11/2020   Procedure: CARDIOVERSION;  Surgeon: [name], MD;  Location: [place];  Service: Cardiovascular;  Laterality: N/A;  . excision of actinic keratosis    . EYE SURGERY     eye  lift  . IR CT HEAD LTD  01/27/2020  . IR PERCUTANEOUS ART THROMBECTOMY/INFUSION INTRACRANIAL INC DIAG ANGIO  01/27/2020      . IR PERCUTANEOUS ART THROMBECTOMY/INFUSION INTRACRANIAL INC DIAG ANGIO  01/27/2020  . RADIOLOGY WITH ANESTHESIA N/A 01/27/2020   Procedure: IR WITH ANESTHESIA;  Surgeon: Radiologist, Medication, MD;  Location: [place];  Service: Radiology;  Laterality: N/A;  . TEE WITHOUT CARDIOVERSION N/A 03/11/2020   Procedure: TRANSESOPHAGEAL ECHOCARDIOGRAM (TEE);  Surgeon: [name], MD;  Location: [place];  Service: Cardiovascular;  Laterality: N/A;  . TONSILLECTOMY    . WISDOM TOOTH EXTRACTION      There were no vitals filed for this visit.   Subjective Assessment - 10/05/20 0938    Subjective Wearing new velcro shoes tonight with new toe cap. Having a little difficulty putting them on by himself.    Pertinent History PMH: HTN, paroxysmal a fib    Limitations Standing;Walking    Patient Stated Goals "wants everything working on R side again"    Currently in Pain? No/denies    Pain Onset 1 to 4 weeks ago                             [facility] Adult PT Treatment/Exercise - 10/05/20 0001      Ambulation/Gait   Ambulation/Gait Yes    Ambulation/Gait Assistance 4: Min guard;4: Min assist    Ambulation/Gait Assistance Details with [place] with 6  prong tip, cues for posture. provided pt with new tennis balls for RW and new strap for hand orthosis due to current one not sticking well to velcro    Ambulation Distance (Feet) 230 Feet   and between activities, plus an additional 115' out to waiting room   Assistive device Straight cane   6 prong tip   Gait Pattern Step-through pattern;Decreased hip/knee flexion - right;Poor foot clearance - right    Ambulation Surface Level;Indoor    Gait Comments over 4 obstacles of approx. 1-2" in height and weaving in and out of 4 cones, over approx. 30', performed x6 reps total, cues for incr hip flexion when stepping over obstacles -  with pt almost getting foot caught due to not lifting it high enough (no LOB), min guard/min A for balance      Knee/Hip Exercises: Standing   Hip Flexion Stengthening;Right;1 set;10 reps;Knee bent    Hip Flexion Limitations single LUE support on chair, performed x10 reps with cues for posture and therapist providing cues and assist at pt's pelvis to prevent compensatory hip hiking, then performed with yellow tband around distal thigh x10 reps for light resistance      Knee/Hip Exercises: Supine   Bridges Strengthening;AROM    Bridges Limitations single leg bridges: lifting into bridge first and lifting up RLE and then lowering to work on R hip flexion 2 x 7 reps, and then stance on RLE with therapist assisting keeping RLE in proper position when gently marching up LLE x6 reps                  PT Education - 10/05/20 1339    Education Details pt and pt's brother in law asking if pt can be left alone over night (from 10 PM - 8 AM) - speech therapist reporting pt is safe cognitively for this. pt would have someone help him get ready for bed and lay in bed when he wakes up until caregiver comes at 8 AM, with pt having a urinal by his bed so he would not need to get up in the middle of the night, will have to further discuss if in case of an emergency if pt is able to safely get out of the house on his own.    Person(s) Educated Patient   pt's brother in law   Methods Explanation    Comprehension Verbalized understanding            PT Short Term Goals - 09/23/20 0903      PT SHORT TERM GOAL #1   Title Pt will decrease TUG from 21 sec to <18 sec for improved balance and functional mobility.    Baseline 08/26/20 21 sec with RW, 16.9 seconds with RW, 17.4 with SPC with quad tip    Time 4    Period Weeks    Status Achieved    Target Date 09/25/20      PT SHORT TERM GOAL #2   Title Pt will ambulate >800' on varied surfaces with RW/right hand grip attachment and AFO supervision for  improved short community distances.    Baseline 600' CGA with walker - supervision/CGA at times when ambulating over pavement, was able to ambulate 900'    Time 4    Period Weeks    Status Partially Met    Target Date 09/25/20             PT Long Term Goals - 08/26/20 1000  PT LONG TERM GOAL #1   Title Pt and pt's family members will be independent with final HEP in order to build upon functional gains made in therapy. ALL LTGS DUE3/15/22    Baseline PT continues to update HEP as pt is progressing. Most recent update 08/26/20    Time 8    Period Weeks    Status On-going    Target Date 10/25/20      PT LONG TERM GOAL #2   Title Pt will ambulate 250' with cane and right AFO CGA for improved household mobility on level surfaces.    Time 8    Period Weeks    Status New    Target Date 10/25/20      PT LONG TERM GOAL #3   Title Pt will increase Berg from 38 to >42/56 for improved balance and decreased fall risk.    Baseline 08/26/20 38/56    Time 8    Period Weeks    Status New    Target Date 10/25/20      PT LONG TERM GOAL #4   Title Pt will ambulate up/down 2 steps with LRAD supervision for improved access in home to living room    Baseline CGA with RW    Time 8    Period Weeks    Status New    Target Date 10/25/20      PT LONG TERM GOAL #5   Title Pt will increase gait speed from 0.1ms to >0.759m for improved community mobility.    Baseline 08/26/20 0.5959m   Time 8    Period Weeks    Status New    Target Date 10/25/20                 Plan - 10/05/20 1341    Clinical Impression Statement Pt wore his new velcro shoes today with leather toe cap. Shoes are a little heavier than pt's current sneakers and almost got foot caught 2 times, cues for tall posture throughout gait. Continued to practice obstacle negotiation (stepping over 2" obstacles) and weaving in and out of cones with SPC with quad tip with min guard/min A. Also focused on hip strengthening,  more so with R hip flexion in standing and supine. Pt tolerated session well, will continue to progress towards LTGs.    Personal Factors and Comorbidities Comorbidity 3+    Comorbidities dense L MCA CVA, paroxysmal A. fib on chronic Xarelto, nonischemic cardiomyopathy and hypertension.    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, works as an artElectrical engineerolving/Moderate complexity    Rehab Potential Good    PT Frequency 2x / week    PT Duration 8 weeks    PT Treatment/Interventions ADLs/Self Care Home Management;Aquatic Therapy;DME Instruction;Gait training;Stair training;Functional mobility training;Therapeutic activities;Therapeutic exercise;Electrical Stimulation;Balance training;Neuromuscular re-education;Wheelchair mobility training;Orthotic Fit/Training;Patient/family education;Passive range of motion;Energy conservation    PT Next Visit Plan discuss emergency plan. continue R hip flexion strengthening. gait training with cane with obstacles,  stair training, step ups/modified curb training.  RLE NMR with decr UE support, balance on compliant surfaces.    PT Home Exercise Plan on 06/28/20 - added standing at countetop and stepping LLE out and in    Consulted and Agree with Plan of Care Patient;Family member/caregiver    Family Member Consulted [name]           Patient will benefit from skilled therapeutic  intervention in order to improve the following deficits and impairments:  Abnormal gait,Decreased activity tolerance,Decreased balance,Decreased cognition,Decreased mobility,Decreased coordination,Decreased range of motion,Decreased endurance,Decreased strength,Hypomobility,Difficulty walking,Impaired UE functional use,Impaired vision/preception,Postural  dysfunction,Impaired sensation  Visit Diagnosis: Other abnormalities of gait and mobility  Muscle weakness (generalized)  Unsteadiness on feet  Other symptoms and signs involving the nervous system     Problem List Patient Active Problem List   Diagnosis Date Noted  . Benign essential HTN 06/09/2020  . Abnormality of gait 04/28/2020  . Neurogenic bladder 03/17/2020  . Sepsis due to pneumonia ([place]) 03/06/2020  . History of stroke with residual deficit 03/06/2020  . Transient hypotension 03/06/2020  . Spastic hemiplegia affecting nondominant side ([place])   . History of hypertension   . Expressive aphasia   . AKI (acute kidney injury) ([place])   . Global aphasia   . Hypoalbuminemia due to protein-calorie malnutrition ([place])   . Dysphagia, post-stroke   . Hyperlipidemia 02/02/2020  . Dysphagia following cerebral infarction 02/02/2020  . Aspiration pneumonia ([place]) 02/02/2020  . Acute ischemic left MCA stroke ([place]) 02/02/2020  . Acute respiratory failure ([place])   . Acute ischemic stroke [facility]) s/p clot retrieval L MCA & ACA A3 01/27/2020  . Aspiration into airway 01/27/2020  . Chronic anticoagulation 01/27/2020  . Paroxysmal atrial fibrillation ([place]) 06/15/2018  . Essential hypertension 06/15/2018  . NICM (nonischemic cardiomyopathy) ([place]) 06/15/2018  . Visit for monitoring Tikosyn therapy 06/12/2018    Arliss Journey, PT, DPT  10/05/2020, 1:50 PM  [facility] [address] [place], [place], [address] Phone: [phone]   Fax:  [phone]  Name: Dustin Schneider MRN: [id number] Date of Birth: October 14, 1955

## 2020-10-05 NOTE — Telephone Encounter (Signed)
done

## 2020-10-05 NOTE — Therapy (Signed)
[facility] [address] [place], [place], [address] Phone: [phone]   Fax:  [phone]  Speech Language Pathology Treatment  Patient Details  Name: Dustin Schneider MRN: [id number] Date of Birth: Jul 04, 1956 Referring Provider (SLP): Dr. Delice Lesch   Encounter Date: 10/05/2020   End of Session - 10/05/20 0939    Visit Number 30    Number of Visits 40    Date for SLP Re-Evaluation 11/04/20    SLP Start Time 0848    SLP Stop Time  0930    SLP Time Calculation (min) 42 min    Activity Tolerance Patient tolerated treatment well           Past Medical History:  Diagnosis Date  . Hypertension   . NICM (nonischemic cardiomyopathy) ([place]) 06/15/2018   04/23/18: EF 45%, 09/15/18: NOrmal LVEF  . Paroxysmal atrial fibrillation ([place]) 06/15/2018  . Stroke ([place])   . Visit for monitoring Tikosyn therapy 06/12/2018    Past Surgical History:  Procedure Laterality Date  . BUBBLE STUDY  03/11/2020   Procedure: BUBBLE STUDY;  Surgeon: [name], MD;  Location: [place];  Service: Cardiovascular;;  . CARDIOVERSION N/A 05/13/2018   Procedure: CARDIOVERSION;  Surgeon: [name], MD;  Location: [facility] ENDOSCOPY;  Service: Cardiovascular;  Laterality: N/A;  . CARDIOVERSION N/A 06/13/2018   Procedure: CARDIOVERSION;  Surgeon: [name], MD;  Location: [facility] ENDOSCOPY;  Service: Cardiovascular;  Laterality: N/A;  . CARDIOVERSION N/A 06/23/2018   Procedure: CARDIOVERSION;  Surgeon: [name], MD;  Location: [facility] ENDOSCOPY;  Service: Cardiovascular;  Laterality: N/A;  . CARDIOVERSION N/A 03/11/2020   Procedure: CARDIOVERSION;  Surgeon: [name], MD;  Location: [place];  Service: Cardiovascular;  Laterality: N/A;  . excision of actinic keratosis    . EYE SURGERY     eye lift  . IR CT HEAD LTD  01/27/2020  . IR PERCUTANEOUS ART THROMBECTOMY/INFUSION INTRACRANIAL INC DIAG ANGIO  01/27/2020      . IR PERCUTANEOUS ART  THROMBECTOMY/INFUSION INTRACRANIAL INC DIAG ANGIO  01/27/2020  . RADIOLOGY WITH ANESTHESIA N/A 01/27/2020   Procedure: IR WITH ANESTHESIA;  Surgeon: Radiologist, Medication, MD;  Location: [facility];  Service: Radiology;  Laterality: N/A;  . TEE WITHOUT CARDIOVERSION N/A 03/11/2020   Procedure: TRANSESOPHAGEAL ECHOCARDIOGRAM (TEE);  Surgeon: [name], MD;  Location: [place];  Service: Cardiovascular;  Laterality: N/A;  . TONSILLECTOMY    . WISDOM TOOTH EXTRACTION      There were no vitals filed for this visit.   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 He had some help from family friend. 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 Outside of therapy, [name] used a drawing to communicate how he wants his painting hung, how many cleets, and what angle. 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 He is  to add 2-4 more re: painting with the most symbolism. [name] read the sentences aloud with 65% accuracy of approximations of words. He ID'd and self corrected errors with usual min to mod A. Jim utilized multimodal communication to relay story of a bird who got off course and landed on his boat on the way to the [place] with extended time, gestures and approximation of words. In structured sentence generation task Carpio (Celeste) [name] generated 3 subject verb objects with mod I. He  required extended time to generate complex sentnece by answering "wh" questions, and occasional min verbal cues.      Assessment / Recommendations / Plan   Plan Continue with current plan of care      Progression Toward Goals   Progression toward goals Progressing toward goals            SLP Education - 10/05/20 0934    Education Details words you practice and are most important to you are coming out easier, more novel conversations are harder and more halting    Person(s) Educated Patient;Caregiver(s)    Methods Explanation;Demonstration;Verbal cues    Comprehension Verbalized understanding;Returned demonstration;Verbal cues required            SLP Short Term Goals - 10/05/20 0938      SLP SHORT TERM GOAL #1   Title Pt will name family, friends and art items with occasional min A 8/10 with approximations allowed.    Status Partially Met      SLP SHORT TERM GOAL #2   Title Pt will name 7 items in personally relevant categories with occasional min A over 2 sessions    Status Partially Met      SLP SHORT TERM GOAL #3   Title Pt will perform 4 automatic speech tasks with rare min A.    Status Partially Met      SLP SHORT TERM GOAL #4   Title Pt will write personal information (name/address/phone), 5 family/friend/pet names and 7  professional words with occasional min A    Status Not Met      SLP SHORT TERM GOAL #5   Title Pt will use mulitmodal communication (gesture, draw, write 1st letter etc) to augment verbal expression to meet needs at home with rare min A from family.    Status Partially Met            SLP Long Term Goals - 10/05/20 5465      SLP LONG TERM GOAL #1   Title Pt will verbalize 3 words to explain or describe his artwork and hobbies with occasional min A over 3 sessions, allowing for intellgible approximations    Baseline 05-17-20 (Lingraphica cues), 05-24-20 (lingraphica), 05-31-20 (lingraphica); 07/25/20 (written word)    Status Achieved      SLP  LONG TERM GOAL #2   Title Pt and family will utilize multimodal compensations for aphasia and verbal apraxia to participate in 3 turns each of conversation with occasional min A over 2 sessions    Baseline 07/25/20;    Time 1    Period Weeks    Status Achieved      SLP LONG TERM GOAL #3   Title Pt will write 3 word phrases with less than 2 errors with rare min A over 3 sessions    Status Not Met      SLP LONG TERM GOAL #4   Title Reading assessment if indicated    Status Deferred  SLP LONG TERM GOAL #5   Title [name] will use multimodal communication spontaneously  (writing, gesture, device) to augment verbal expression as needed 3/5 times with rare min A    Time 4   Goal renewed 09-23-20   Period Weeks    Status On-going      SLP LONG TERM GOAL #6   Title [name] will write (approximate) 4 items in personally relevant category that is understood by family/friends    Baseline 08/22/20; 08/31/20; 09/06/19    Time 4    Period Weeks    Status Achieved      SLP LONG TERM GOAL #7   Title Pt will employ multimodal communication to participate in 6 turns in conversation with extended time and occasional min A    Time 3   Goals renewed 09-23-20   Period Weeks    Status On-going            Plan - 10/05/20 4259    Clinical Impression Statement [name] exhibits moderate Broca's aphasia and severe verbal apraxia impacting communication of wants/needs, with steady improvements noted during ST sessions. 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 SLP targeted multimodal communication of pre-scripted responses to optimize expressive verbal output. Pt and caregivers continue to require some assistance for compensatory strategies for multimodal communcation. Pt would continue to benefit from additional ST visits to increase QOL and effective communication    Speech Therapy Frequency 2x / week    Duration --   40 visits   Treatment/Interventions  Compensatory strategies;Cueing hierarchy;Functional tasks;Patient/family education;Multimodal communcation approach;SLP instruction and feedback    Potential to Achieve Goals Good           Patient will benefit from skilled therapeutic intervention in order to improve the following deficits and impairments:   Verbal apraxia  Aphasia    Problem List Patient Active Problem List   Diagnosis Date Noted  . Benign essential HTN 06/09/2020  . Abnormality of gait 04/28/2020  . Neurogenic bladder 03/17/2020  . Sepsis due to pneumonia ([place]) 03/06/2020  . History of stroke with residual deficit 03/06/2020  . Transient hypotension 03/06/2020  . Spastic hemiplegia affecting nondominant side ([place])   . History of hypertension   . Expressive aphasia   . AKI (acute kidney injury) ([place])   . Global aphasia   . Hypoalbuminemia due to protein-calorie malnutrition ([place])   . Dysphagia, post-stroke   . Hyperlipidemia 02/02/2020  . Dysphagia following cerebral infarction 02/02/2020  . Aspiration pneumonia ([place]) 02/02/2020  . Acute ischemic left MCA stroke ([place]) 02/02/2020  . Acute respiratory failure ([place])   . Acute ischemic stroke [facility]) s/p clot retrieval L MCA & ACA A3 01/27/2020  . Aspiration into airway 01/27/2020  . Chronic anticoagulation 01/27/2020  . Paroxysmal atrial fibrillation ([place]) 06/15/2018  . Essential hypertension 06/15/2018  . NICM (nonischemic cardiomyopathy) ([place]) 06/15/2018  . Visit for monitoring Tikosyn therapy 06/12/2018    Kaylyne Axton, Annye Rusk MS, CCC-SLP 10/05/2020, 9:39 AM  [facility] [address] [place], [place], [address] Phone: [phone]   Fax:  [phone]   Name: LAVAL CAFARO MRN: [id number] Date of Birth: 08-23-1955

## 2020-10-07 ENCOUNTER — Encounter: Payer: Self-pay | Admitting: Speech Pathology

## 2020-10-07 ENCOUNTER — Ambulatory Visit: Payer: BC Managed Care – PPO | Admitting: Speech Pathology

## 2020-10-07 ENCOUNTER — Other Ambulatory Visit: Payer: Self-pay

## 2020-10-07 ENCOUNTER — Ambulatory Visit: Payer: BC Managed Care – PPO

## 2020-10-07 DIAGNOSIS — R4701 Aphasia: Secondary | ICD-10-CM

## 2020-10-07 DIAGNOSIS — I69351 Hemiplegia and hemiparesis following cerebral infarction affecting right dominant side: Secondary | ICD-10-CM

## 2020-10-07 DIAGNOSIS — M6281 Muscle weakness (generalized): Secondary | ICD-10-CM

## 2020-10-07 DIAGNOSIS — R2689 Other abnormalities of gait and mobility: Secondary | ICD-10-CM

## 2020-10-07 DIAGNOSIS — R482 Apraxia: Secondary | ICD-10-CM

## 2020-10-07 MED ORDER — DOFETILIDE 500 MCG PO CAPS: 500.0000 ug | ORAL_CAPSULE | Freq: Two times a day (BID) | ORAL | 2 refills | Status: DC

## 2020-10-07 NOTE — Therapy (Signed)
[facility] [address] [place], [place], [address] Phone: [phone]   Fax:  [phone]  Speech Language Pathology Treatment  Patient Details  Name: Dustin Schneider MRN: [id number] Date of Birth: 1955/11/28 Referring Provider (SLP): Dr. Delice Lesch   Encounter Date: 10/07/2020   End of Session - 10/07/20 1222    Visit Number 31    Number of Visits 40    Date for SLP Re-Evaluation 11/04/20    SLP Start Time 0846    SLP Stop Time  0928    SLP Time Calculation (min) 42 min    Activity Tolerance Patient tolerated treatment well           Past Medical History:  Diagnosis Date  . Hypertension   . NICM (nonischemic cardiomyopathy) ([place]) 06/15/2018   04/23/18: EF 45%, 09/15/18: NOrmal LVEF  . Paroxysmal atrial fibrillation ([place]) 06/15/2018  . Stroke ([place])   . Visit for monitoring Tikosyn therapy 06/12/2018    Past Surgical History:  Procedure Laterality Date  . BUBBLE STUDY  03/11/2020   Procedure: BUBBLE STUDY;  Surgeon: [name], MD;  Location: [facility];  Service: Cardiovascular;;  . CARDIOVERSION N/A 05/13/2018   Procedure: CARDIOVERSION;  Surgeon: [name], MD;  Location: [facility] ENDOSCOPY;  Service: Cardiovascular;  Laterality: N/A;  . CARDIOVERSION N/A 06/13/2018   Procedure: CARDIOVERSION;  Surgeon: [name], MD;  Location: [facility] ENDOSCOPY;  Service: Cardiovascular;  Laterality: N/A;  . CARDIOVERSION N/A 06/23/2018   Procedure: CARDIOVERSION;  Surgeon: [name], MD;  Location: [facility] ENDOSCOPY;  Service: Cardiovascular;  Laterality: N/A;  . CARDIOVERSION N/A 03/11/2020   Procedure: CARDIOVERSION;  Surgeon: [name], MD;  Location: [facility];  Service: Cardiovascular;  Laterality: N/A;  . excision of actinic keratosis    . EYE SURGERY     eye lift  . IR CT HEAD LTD  01/27/2020  . IR PERCUTANEOUS ART THROMBECTOMY/INFUSION INTRACRANIAL INC DIAG ANGIO  01/27/2020      . IR PERCUTANEOUS ART  THROMBECTOMY/INFUSION INTRACRANIAL INC DIAG ANGIO  01/27/2020  . RADIOLOGY WITH ANESTHESIA N/A 01/27/2020   Procedure: IR WITH ANESTHESIA;  Surgeon: Radiologist, Medication, MD;  Location: [facility];  Service: Radiology;  Laterality: N/A;  . TEE WITHOUT CARDIOVERSION N/A 03/11/2020   Procedure: TRANSESOPHAGEAL ECHOCARDIOGRAM (TEE);  Surgeon: [name], MD;  Location: [facility];  Service: Cardiovascular;  Laterality: N/A;  . TONSILLECTOMY    . WISDOM TOOTH EXTRACTION      There were no vitals filed for this visit.   Subjective Assessment - 10/07/20 0850    Subjective "Great!"    Currently in Pain? No/denies                 ADULT SLP TREATMENT - 10/07/20 0850      General Information   Behavior/Cognition Cooperative;Pleasant mood      Treatment Provided   Treatment provided Cognitive-Linquistic      Cognitive-Linquistic Treatment   Treatment focused on Aphasia;Patient/family/caregiver education;Apraxia    Skilled Treatment Dustin Schneider relayed story about his [employer] bust in [place] and how it lead to his commision of [employer] with consistent extended time, used gestures and approximations, and required usual A from family to verbalize and communicate names of people involved as this can't be guessed by listener. Syntax and word finidng targeted with Nutritional therapist (VNeST) - Dustin Schneider genrated 3 subject verb object sentences for 2 verbs with extended time and occasional mod semantic and questioning cues. He answered "  wh" questions to generate 2 complex sentences with usual min to mod A and extended time.      Assessment / Recommendations / Plan   Plan Continue with current plan of care      Progression Toward Goals   Progression toward goals Progressing toward goals              SLP Short Term Goals - 10/07/20 1221      SLP SHORT TERM GOAL #1   Title Pt will name family, friends and art items with occasional min A 8/10 with approximations allowed.     Status Partially Met      SLP SHORT TERM GOAL #2   Title Pt will name 7 items in personally relevant categories with occasional min A over 2 sessions    Status Partially Met      SLP SHORT TERM GOAL #3   Title Pt will perform 4 automatic speech tasks with rare min A.    Status Partially Met      SLP SHORT TERM GOAL #4   Title Pt will write personal information (name/address/phone), 5 family/friend/pet names and 7  professional words with occasional min A    Status Not Met      SLP SHORT TERM GOAL #5   Title Pt will use mulitmodal communication (gesture, draw, write 1st letter etc) to augment verbal expression to meet needs at home with rare min A from family.    Status Partially Met            SLP Long Term Goals - 10/07/20 1221      SLP LONG TERM GOAL #1   Title Pt will verbalize 3 words to explain or describe his artwork and hobbies with occasional min A over 3 sessions, allowing for intellgible approximations    Baseline 05-17-20 (Lingraphica cues), 05-24-20 (lingraphica), 05-31-20 (lingraphica); 07/25/20 (written word)    Status Achieved      SLP LONG TERM GOAL #2   Title Pt and family will utilize multimodal compensations for aphasia and verbal apraxia to participate in 3 turns each of conversation with occasional min A over 2 sessions    Baseline 07/25/20;    Time 1    Period Weeks    Status Achieved      SLP LONG TERM GOAL #3   Title Pt will write 3 word phrases with less than 2 errors with rare min A over 3 sessions    Status Not Met      SLP LONG TERM GOAL #4   Title Reading assessment if indicated    Status Deferred      SLP LONG TERM GOAL #5   Title Dustin Schneider will use multimodal communication spontaneously  (writing, gesture, device) to augment verbal expression as needed 3/5 times with rare min A    Time 4   Goal renewed 09-23-20   Period Weeks    Status On-going      SLP LONG TERM GOAL #6   Title Dustin Schneider will write (approximate) 4 items in personally relevant  category that is understood by family/friends    Baseline 08/22/20; 08/31/20; 09/06/19    Time 4    Period Weeks    Status Achieved      SLP LONG TERM GOAL #7   Title Pt will employ multimodal communication to participate in 6 turns in conversation with extended time and occasional min A    Time 3   Goals renewed 09-23-20   Period Weeks  Status On-going            Plan - 10/07/20 1221    Clinical Impression Statement Dustin Schneider exhibits moderate Broca's aphasia and severe verbal apraxia impacting communication of wants/needs, with steady improvements noted during ST sessions. 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 SLP targeted multimodal communication of pre-scripted responses to optimize expressive verbal output. Pt and caregivers continue to require some assistance for compensatory strategies for multimodal communcation. Pt would continue to benefit from additional ST visits to increase QOL and effective communication    Speech Therapy Frequency 2x / week    Duration --   40 visits   Treatment/Interventions Compensatory strategies;Cueing hierarchy;Functional tasks;Patient/family education;Multimodal communcation approach;SLP instruction and feedback    Potential to Achieve Goals Good           Patient will benefit from skilled therapeutic intervention in order to improve the following deficits and impairments:   Aphasia  Verbal apraxia    Problem List Patient Active Problem List   Diagnosis Date Noted  . Benign essential HTN 06/09/2020  . Abnormality of gait 04/28/2020  . Neurogenic bladder 03/17/2020  . Sepsis due to pneumonia ([place]) 03/06/2020  . History of stroke with residual deficit 03/06/2020  . Transient hypotension 03/06/2020  . Spastic hemiplegia affecting nondominant side ([place])   . History of hypertension   . Expressive aphasia   . AKI (acute kidney injury) ([place])   . Global aphasia   . Hypoalbuminemia due to  protein-calorie malnutrition ([place])   . Dysphagia, post-stroke   . Hyperlipidemia 02/02/2020  . Dysphagia following cerebral infarction 02/02/2020  . Aspiration pneumonia ([place]) 02/02/2020  . Acute ischemic left MCA stroke ([place]) 02/02/2020  . Acute respiratory failure ([place])   . Acute ischemic stroke [facility]) s/p clot retrieval L MCA & ACA A3 01/27/2020  . Aspiration into airway 01/27/2020  . Chronic anticoagulation 01/27/2020  . Paroxysmal atrial fibrillation ([place]) 06/15/2018  . Essential hypertension 06/15/2018  . NICM (nonischemic cardiomyopathy) ([place]) 06/15/2018  . Visit for monitoring Tikosyn therapy 06/12/2018    Lovvorn, Annye Rusk MS, CCC-SLP 10/07/2020, 12:22 PM  [facility] [address] [place], [place], [address] Phone: [phone]   Fax:  [phone]   Name: CAILEN MIHALIK MRN: [id number] Date of Birth: 1956/08/08

## 2020-10-07 NOTE — Therapy (Signed)
[facility] [address] [place], [place], [address] Phone: [phone]   Fax:  [phone]  Physical Therapy Treatment  Patient Details  Name: Dustin Schneider MRN: [id number] Date of Birth: Sep 06, 1955 Referring Provider (PT): Lauraine Rinne, PA-C but followed by Delice Lesch   Encounter Date: 10/07/2020   PT End of Session - 10/07/20 0934    Visit Number 19    Number of Visits 23    Date for PT Re-Evaluation 10/25/20    Authorization Type BCBS    PT Start Time 0932    PT Stop Time 1015    PT Time Calculation (min) 43 min    Equipment Utilized During Treatment Gait belt    Activity Tolerance Patient tolerated treatment well    Behavior During Therapy [facility] for tasks assessed/performed           Past Medical History:  Diagnosis Date  . Hypertension   . NICM (nonischemic cardiomyopathy) ([place]) 06/15/2018   04/23/18: EF 45%, 09/15/18: NOrmal LVEF  . Paroxysmal atrial fibrillation ([place]) 06/15/2018  . Stroke ([place])   . Visit for monitoring Tikosyn therapy 06/12/2018    Past Surgical History:  Procedure Laterality Date  . BUBBLE STUDY  03/11/2020   Procedure: BUBBLE STUDY;  Surgeon: [name], MD;  Location: [facility];  Service: Cardiovascular;;  . CARDIOVERSION N/A 05/13/2018   Procedure: CARDIOVERSION;  Surgeon: [name], MD;  Location: [facility] ENDOSCOPY;  Service: Cardiovascular;  Laterality: N/A;  . CARDIOVERSION N/A 06/13/2018   Procedure: CARDIOVERSION;  Surgeon: [name], MD;  Location: [facility] ENDOSCOPY;  Service: Cardiovascular;  Laterality: N/A;  . CARDIOVERSION N/A 06/23/2018   Procedure: CARDIOVERSION;  Surgeon: [name], MD;  Location: [facility] ENDOSCOPY;  Service: Cardiovascular;  Laterality: N/A;  . CARDIOVERSION N/A 03/11/2020   Procedure: CARDIOVERSION;  Surgeon: [name], MD;  Location: [facility];  Service: Cardiovascular;  Laterality: N/A;  . excision of actinic keratosis    . EYE SURGERY     eye  lift  . IR CT HEAD LTD  01/27/2020  . IR PERCUTANEOUS ART THROMBECTOMY/INFUSION INTRACRANIAL INC DIAG ANGIO  01/27/2020      . IR PERCUTANEOUS ART THROMBECTOMY/INFUSION INTRACRANIAL INC DIAG ANGIO  01/27/2020  . RADIOLOGY WITH ANESTHESIA N/A 01/27/2020   Procedure: IR WITH ANESTHESIA;  Surgeon: Radiologist, Medication, MD;  Location: [facility];  Service: Radiology;  Laterality: N/A;  . TEE WITHOUT CARDIOVERSION N/A 03/11/2020   Procedure: TRANSESOPHAGEAL ECHOCARDIOGRAM (TEE);  Surgeon: [name], MD;  Location: [facility];  Service: Cardiovascular;  Laterality: N/A;  . TONSILLECTOMY    . WISDOM TOOTH EXTRACTION      There were no vitals filed for this visit.   Subjective Assessment - 10/07/20 0935    Subjective Pt reports he went to his office up stairs at [employer] the other day.    Pertinent History PMH: HTN, paroxysmal a fib    Limitations Standing;Walking    Patient Stated Goals "wants everything working on R side again"    Currently in Pain? No/denies    Pain Onset --                             [facility] Adult PT Treatment/Exercise - 10/07/20 0940      Ambulation/Gait   Ambulation/Gait Yes    Ambulation/Gait Assistance 4: Min guard    Ambulation/Gait Assistance Details Pt was cued to focus on increased right foot clearance. Pt with less  ER on right today. Verbal cues to also stand up tall with shoulders back.    Ambulation Distance (Feet) 230 Feet    Assistive device Straight cane   6 prong tip on cane. right AFO   Gait Pattern Step-through pattern;Decreased hip/knee flexion - right;Poor foot clearance - right    Ambulation Surface Level;Indoor    Stairs Yes    Stairs Assistance 5: Supervision    Stair Management Technique Step to pattern;One rail Left    Number of Stairs 8    Height of Stairs 6    Pre-Gait Activities At bottom of steps: tapping bottom step with RLE x 5 then repeated x 5 with verbal cues not to PF on left to help. Tactile cues to prevent hip hike.       Neuro Re-ed    Neuro Re-ed Details  Gait over obstacle course: reciprocal steps over 3 2x4" bolsters, over blue mat and weaving in and out of 4 cones x 6 bouts. Pt did well with stepping over bolsters and weight shifting to right. Challenged most on blue mat with smaller steps.  Step-ups on rockerboard positioned ant/post x 6 with RLE with cane support with CGA/min assist with verbal cues to weight shift over right leg and tactile cues at right knee to try to keep slight bend. Pt also cued to slow down left step up.      Exercises   Exercises Other Exercises    Other Exercises  Hooklying on mat: right hip flexion and returning down with manual resistance from PT as well as verbal cues to control descent. PT prevented hip ER during performance.      Knee/Hip Exercises: Aerobic   Other Aerobic Sci-Fit x 6 min level 3.5 with verbal cues to focus on keeping right hip in neutral position with LUE assist as well.                  PT Education - 10/07/20 1248    Education Details PT discussed with pt about being prepared for how he could get out of house at night in case of emergency. Suggested he keep w/c near bed so he could get to front door if needed as could not take the time to try to get AFO donned in case of emergency.    Person(s) Educated Patient   brother-in-law, [name]   Methods Explanation    Comprehension Verbalized understanding            PT Short Term Goals - 09/23/20 0903      PT SHORT TERM GOAL #1   Title Pt will decrease TUG from 21 sec to <18 sec for improved balance and functional mobility.    Baseline 08/26/20 21 sec with RW, 16.9 seconds with RW, 17.4 with SPC with quad tip    Time 4    Period Weeks    Status Achieved    Target Date 09/25/20      PT SHORT TERM GOAL #2   Title Pt will ambulate >800' on varied surfaces with RW/right hand grip attachment and AFO supervision for improved short community distances.    Baseline 600' CGA with walker -  supervision/CGA at times when ambulating over pavement, was able to ambulate 900'    Time 4    Period Weeks    Status Partially Met    Target Date 09/25/20             PT Long Term Goals - 08/26/20 1000  PT LONG TERM GOAL #1   Title Pt and pt's family members will be independent with final HEP in order to build upon functional gains made in therapy. ALL LTGS DUE3/15/22    Baseline PT continues to update HEP as pt is progressing. Most recent update 08/26/20    Time 8    Period Weeks    Status On-going    Target Date 10/25/20      PT LONG TERM GOAL #2   Title Pt will ambulate 250' with cane and right AFO CGA for improved household mobility on level surfaces.    Time 8    Period Weeks    Status New    Target Date 10/25/20      PT LONG TERM GOAL #3   Title Pt will increase Berg from 38 to >42/56 for improved balance and decreased fall risk.    Baseline 08/26/20 38/56    Time 8    Period Weeks    Status New    Target Date 10/25/20      PT LONG TERM GOAL #4   Title Pt will ambulate up/down 2 steps with LRAD supervision for improved access in home to living room    Baseline CGA with RW    Time 8    Period Weeks    Status New    Target Date 10/25/20      PT LONG TERM GOAL #5   Title Pt will increase gait speed from 0.78ms to >0.785m for improved community mobility.    Baseline 08/26/20 0.5972m   Time 8    Period Weeks    Status New    Target Date 10/25/20                 Plan - 10/07/20 1250    Clinical Impression Statement Pt was wearing his old lace up sneakers today. Reports he can get these on himself better just can't tie. Pt did well with with negotiating over 2" obstacles and with turning today. Most challenged on compliant surface. Continued to focus on right hip strengthening as well. PT noted less ER at right hip with gait.    Personal Factors and Comorbidities Comorbidity 3+    Comorbidities dense L MCA CVA, paroxysmal A. fib on chronic Xarelto,  nonischemic cardiomyopathy and hypertension.    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 gait training with cane with obstacles,  stair training, step ups/modified curb training.  RLE NMR with decr UE support, balance on compliant surfaces.    PT Home Exercise Plan on 06/28/20 - added standing at countetop and stepping LLE out and in    Consulted and Agree with Plan of Care Patient;Family member/caregiver    Family Member Consulted [name]           Patient will benefit from skilled therapeutic intervention in order to improve the following deficits and impairments:  Abnormal gait,Decreased activity tolerance,Decreased balance,Decreased cognition,Decreased mobility,Decreased coordination,Decreased range of motion,Decreased endurance,Decreased strength,Hypomobility,Difficulty walking,Impaired UE functional use,Impaired vision/preception,Postural dysfunction,Impaired  sensation  Visit Diagnosis: Other abnormalities of gait and mobility  Muscle weakness (generalized)  Hemiplegia and hemiparesis following cerebral infarction affecting right dominant side [facility])     Problem List Patient Active Problem List    Diagnosis Date Noted  . Benign essential HTN 06/09/2020  . Abnormality of gait 04/28/2020  . Neurogenic bladder 03/17/2020  . Sepsis due to pneumonia ([place]) 03/06/2020  . History of stroke with residual deficit 03/06/2020  . Transient hypotension 03/06/2020  . Spastic hemiplegia affecting nondominant side ([place])   . History of hypertension   . Expressive aphasia   . AKI (acute kidney injury) ([place])   . Global aphasia   . Hypoalbuminemia due to protein-calorie malnutrition ([place])   . Dysphagia, post-stroke   . Hyperlipidemia 02/02/2020  . Dysphagia following cerebral infarction 02/02/2020  . Aspiration pneumonia ([place]) 02/02/2020  . Acute ischemic left MCA stroke ([place]) 02/02/2020  . Acute respiratory failure ([place])   . Acute ischemic stroke [facility]) s/p clot retrieval L MCA & ACA A3 01/27/2020  . Aspiration into airway 01/27/2020  . Chronic anticoagulation 01/27/2020  . Paroxysmal atrial fibrillation ([place]) 06/15/2018  . Essential hypertension 06/15/2018  . NICM (nonischemic cardiomyopathy) ([place]) 06/15/2018  . Visit for monitoring Tikosyn therapy 06/12/2018    Electa Sniff, PT, DPT, NCS 10/07/2020, 12:56 PM  [facility] [address] [place], [place], [address] Phone: [phone]   Fax:  [phone]  Name: ABHIJOT STRAUGHTER MRN: [id number] Date of Birth: [birth year]

## 2020-10-10 ENCOUNTER — Other Ambulatory Visit: Payer: Self-pay

## 2020-10-10 ENCOUNTER — Ambulatory Visit: Payer: BC Managed Care – PPO

## 2020-10-10 ENCOUNTER — Ambulatory Visit: Payer: BC Managed Care – PPO | Admitting: Physical Therapy

## 2020-10-10 ENCOUNTER — Encounter: Payer: Self-pay | Admitting: Physical Therapy

## 2020-10-10 DIAGNOSIS — R482 Apraxia: Secondary | ICD-10-CM

## 2020-10-10 DIAGNOSIS — R2689 Other abnormalities of gait and mobility: Secondary | ICD-10-CM | POA: Diagnosis not present

## 2020-10-10 DIAGNOSIS — R4701 Aphasia: Secondary | ICD-10-CM

## 2020-10-10 DIAGNOSIS — R29818 Other symptoms and signs involving the nervous system: Secondary | ICD-10-CM

## 2020-10-10 DIAGNOSIS — R2681 Unsteadiness on feet: Secondary | ICD-10-CM

## 2020-10-10 DIAGNOSIS — M6281 Muscle weakness (generalized): Secondary | ICD-10-CM

## 2020-10-10 DIAGNOSIS — R262 Difficulty in walking, not elsewhere classified: Secondary | ICD-10-CM

## 2020-10-10 NOTE — Therapy (Signed)
[facility] [address] [place], [place], [address] Phone: [phone]   Fax:  [phone]  Speech Language Pathology Treatment  Patient Details  Name: Dustin Schneider MRN: [id number] Date of Birth: Mar 12, 1956 Referring Provider (SLP): Dr. Delice Lesch   Encounter Date: 10/10/2020   End of Session - 10/10/20 1408    Visit Number 32    Number of Visits 40    Date for SLP Re-Evaluation 11/04/20    SLP Start Time 1315    SLP Stop Time  1400    SLP Time Calculation (min) 45 min    Activity Tolerance Patient tolerated treatment well           Past Medical History:  Diagnosis Date  . Hypertension   . NICM (nonischemic cardiomyopathy) ([place]) 06/15/2018   04/23/18: EF 45%, 09/15/18: NOrmal LVEF  . Paroxysmal atrial fibrillation ([place]) 06/15/2018  . Stroke ([place])   . Visit for monitoring Tikosyn therapy 06/12/2018    Past Surgical History:  Procedure Laterality Date  . BUBBLE STUDY  03/11/2020   Procedure: BUBBLE STUDY;  Surgeon: [name], MD;  Location: [facility];  Service: Cardiovascular;;  . CARDIOVERSION N/A 05/13/2018   Procedure: CARDIOVERSION;  Surgeon: [name], MD;  Location: [facility] ENDOSCOPY;  Service: Cardiovascular;  Laterality: N/A;  . CARDIOVERSION N/A 06/13/2018   Procedure: CARDIOVERSION;  Surgeon: [name], MD;  Location: [facility] ENDOSCOPY;  Service: Cardiovascular;  Laterality: N/A;  . CARDIOVERSION N/A 06/23/2018   Procedure: CARDIOVERSION;  Surgeon: [name], MD;  Location: [facility] ENDOSCOPY;  Service: Cardiovascular;  Laterality: N/A;  . CARDIOVERSION N/A 03/11/2020   Procedure: CARDIOVERSION;  Surgeon: [name], MD;  Location: [facility];  Service: Cardiovascular;  Laterality: N/A;  . excision of actinic keratosis    . EYE SURGERY     eye lift  . IR CT HEAD LTD  01/27/2020  . IR PERCUTANEOUS ART THROMBECTOMY/INFUSION INTRACRANIAL INC DIAG ANGIO  01/27/2020      . IR PERCUTANEOUS ART  THROMBECTOMY/INFUSION INTRACRANIAL INC DIAG ANGIO  01/27/2020  . RADIOLOGY WITH ANESTHESIA N/A 01/27/2020   Procedure: IR WITH ANESTHESIA;  Surgeon: Radiologist, Medication, MD;  Location: [place];  Service: Radiology;  Laterality: N/A;  . TEE WITHOUT CARDIOVERSION N/A 03/11/2020   Procedure: TRANSESOPHAGEAL ECHOCARDIOGRAM (TEE);  Surgeon: [name], MD;  Location: [facility];  Service: Cardiovascular;  Laterality: N/A;  . TONSILLECTOMY    . WISDOM TOOTH EXTRACTION      There were no vitals filed for this visit.   Subjective Assessment - 10/10/20 1404    Subjective "fine"    Patient is accompained by: Family member    Currently in Pain? No/denies    Pain Score 0-No pain                 ADULT SLP TREATMENT - 10/10/20 0001      General Information   Behavior/Cognition Cooperative;Pleasant mood      Treatment Provided   Treatment provided Cognitive-Linquistic      Cognitive-Linquistic Treatment   Treatment focused on Aphasia;Patient/family/caregiver education;Apraxia    Skilled Treatment SLP conducted further trials of VNeST for verb x1. SLP provided visual cues via sticky notes. Pt able to verbalize subjects/objects with rare min A. Pt able to answer [facility] questions with occasional min A for articulation precision. Pt read sentences with good accuracy given min A. SLP provided sticky notes and verb list to caregiver for addtional practice. Pt reviewed paintings x2 with intermittent word finding deficits  noted. Mild frustration exihibited seemingly secondary to fatigue. Cued writing words were effective for 2/2 opportunites.      Assessment / Recommendations / Plan   Plan Continue with current plan of care      Progression Toward Goals   Progression toward goals Progressing toward goals            SLP Education - 10/10/20 1404    Education Details VNeST verbs to practice full sentences    Person(s) Educated Patient;Caregiver(s)    Methods Explanation;Demonstration;Handout     Comprehension Verbalized understanding;Returned demonstration;Need further instruction            SLP Short Term Goals - 10/10/20 1312      SLP SHORT TERM GOAL #1   Title Pt will name family, friends and art items with occasional min A 8/10 with approximations allowed.    Status Partially Met      SLP SHORT TERM GOAL #2   Title Pt will name 7 items in personally relevant categories with occasional min A over 2 sessions    Status Partially Met      SLP SHORT TERM GOAL #3   Title Pt will perform 4 automatic speech tasks with rare min A.    Status Partially Met      SLP SHORT TERM GOAL #4   Title Pt will write personal information (name/address/phone), 5 family/friend/pet names and 7  professional words with occasional min A    Status Not Met      SLP SHORT TERM GOAL #5   Title Pt will use mulitmodal communication (gesture, draw, write 1st letter etc) to augment verbal expression to meet needs at home with rare min A from family.    Status Partially Met            SLP Long Term Goals - 10/10/20 1312      SLP LONG TERM GOAL #1   Title Pt will verbalize 3 words to explain or describe his artwork and hobbies with occasional min A over 3 sessions, allowing for intellgible approximations    Baseline 05-17-20 (Lingraphica cues), 05-24-20 (lingraphica), 05-31-20 (lingraphica); 07/25/20 (written word)    Status Achieved      SLP LONG TERM GOAL #2   Title Pt and family will utilize multimodal compensations for aphasia and verbal apraxia to participate in 3 turns each of conversation with occasional min A over 2 sessions    Baseline 07/25/20;    Time --    Period Weeks    Status Achieved      SLP LONG TERM GOAL #3   Title Pt will write 3 word phrases with less than 2 errors with rare min A over 3 sessions    Status Not Met      SLP LONG TERM GOAL #4   Title Reading assessment if indicated    Status Deferred      SLP LONG TERM GOAL #5   Title [name] will use multimodal  communication spontaneously  (writing, gesture, device) to augment verbal expression as needed 3/5 times with rare min A    Baseline 10-10-20    Time 2   Goal renewed 09-23-20   Period Weeks    Status On-going      SLP LONG TERM GOAL #6   Title [name] will write (approximate) 4 items in personally relevant category that is understood by family/friends    Baseline 08/22/20; 08/31/20; 09/06/19    Status Achieved      SLP LONG TERM  GOAL #7   Title Pt will employ multimodal communication to participate in 6 turns in conversation with extended time and occasional min A    Time 1   Goals renewed 09-23-20   Period Weeks    Status On-going            Plan - 10/10/20 1409    Clinical Impression Statement [name] exhibits moderate Broca's aphasia and severe verbal apraxia impacting communication of wants/needs, with steady improvements noted during ST sessions. 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 SLP targeted VNeST to address increased MLU and syntax, which optimized expressive verbal output. Pt and caregivers continue to require some assistance for compensatory strategies for multimodal communcation. Pt would continue to benefit from additional ST visits to increase QOL and effective communication    Speech Therapy Frequency 2x / week    Duration --   40 visits   Treatment/Interventions Compensatory strategies;Cueing hierarchy;Functional tasks;Patient/family education;Multimodal communcation approach;SLP instruction and feedback    Potential Considerations Severity of impairments    SLP Home Exercise Plan Use written communication, make pre-scripted phrases, Facetime    Consulted and Agree with Plan of Care Patient    Family Member Consulted Caregiver           Patient will benefit from skilled therapeutic intervention in order to improve the following deficits and impairments:   Verbal apraxia  Aphasia    Problem List Patient Active  Problem List   Diagnosis Date Noted  . Benign essential HTN 06/09/2020  . Abnormality of gait 04/28/2020  . Neurogenic bladder 03/17/2020  . Sepsis due to pneumonia ([place]) 03/06/2020  . History of stroke with residual deficit 03/06/2020  . Transient hypotension 03/06/2020  . Spastic hemiplegia affecting nondominant side ([place])   . History of hypertension   . Expressive aphasia   . AKI (acute kidney injury) ([place])   . Global aphasia   . Hypoalbuminemia due to protein-calorie malnutrition ([place])   . Dysphagia, post-stroke   . Hyperlipidemia 02/02/2020  . Dysphagia following cerebral infarction 02/02/2020  . Aspiration pneumonia ([place]) 02/02/2020  . Acute ischemic left MCA stroke ([place]) 02/02/2020  . Acute respiratory failure ([place])   . Acute ischemic stroke [facility]) s/p clot retrieval L MCA & ACA A3 01/27/2020  . Aspiration into airway 01/27/2020  . Chronic anticoagulation 01/27/2020  . Paroxysmal atrial fibrillation ([place]) 06/15/2018  . Essential hypertension 06/15/2018  . NICM (nonischemic cardiomyopathy) ([place]) 06/15/2018  . Visit for monitoring Tikosyn therapy 06/12/2018    Alinda Deem, MA CCC-SLP 10/10/2020, 2:11 PM  [facility] [address] [place], [place], [address] Phone: [phone]   Fax:  [phone]   Name: TIMONTHY HOVATER MRN: [id number] Date of Birth: 1955-10-22

## 2020-10-10 NOTE — Patient Instructions (Signed)
run crawl fly swim jump type cook yell read drink sleep splash shake write smile carry wash open walk make sit ride swing skate laugh climb dive paint wave clean play

## 2020-10-10 NOTE — Therapy (Signed)
[facility] [address] [place], [place], [address] Phone: [phone]   Fax:  [phone]  Physical Therapy Treatment  Patient Details  Name: Dustin Schneider MRN: [id number] Date of Birth: 1955-11-10 Referring Provider (PT): Lauraine Rinne, PA-C but followed by Delice Lesch   Encounter Date: 10/10/2020   PT End of Session - 10/10/20 1505    Visit Number 60    Number of Visits 22    Date for PT Re-Evaluation 10/25/20    Authorization Type BCBS    PT Start Time 1233    PT Stop Time 1315    PT Time Calculation (min) 42 min    Equipment Utilized During Treatment Gait belt    Activity Tolerance Patient tolerated treatment well    Behavior During Therapy [facility] for tasks assessed/performed           Past Medical History:  Diagnosis Date  . Hypertension   . NICM (nonischemic cardiomyopathy) ([place]) 06/15/2018   04/23/18: EF 45%, 09/15/18: NOrmal LVEF  . Paroxysmal atrial fibrillation ([place]) 06/15/2018  . Stroke ([place])   . Visit for monitoring Tikosyn therapy 06/12/2018    Past Surgical History:  Procedure Laterality Date  . BUBBLE STUDY  03/11/2020   Procedure: BUBBLE STUDY;  Surgeon: [name], MD;  Location: [facility];  Service: Cardiovascular;;  . CARDIOVERSION N/A 05/13/2018   Procedure: CARDIOVERSION;  Surgeon: [name], MD;  Location: [facility] ENDOSCOPY;  Service: Cardiovascular;  Laterality: N/A;  . CARDIOVERSION N/A 06/13/2018   Procedure: CARDIOVERSION;  Surgeon: [name], MD;  Location: [facility] ENDOSCOPY;  Service: Cardiovascular;  Laterality: N/A;  . CARDIOVERSION N/A 06/23/2018   Procedure: CARDIOVERSION;  Surgeon: [name], MD;  Location: [facility] ENDOSCOPY;  Service: Cardiovascular;  Laterality: N/A;  . CARDIOVERSION N/A 03/11/2020   Procedure: CARDIOVERSION;  Surgeon: [name], MD;  Location: [facility];  Service: Cardiovascular;  Laterality: N/A;  . excision of actinic keratosis    . EYE SURGERY     eye  lift  . IR CT HEAD LTD  01/27/2020  . IR PERCUTANEOUS ART THROMBECTOMY/INFUSION INTRACRANIAL INC DIAG ANGIO  01/27/2020      . IR PERCUTANEOUS ART THROMBECTOMY/INFUSION INTRACRANIAL INC DIAG ANGIO  01/27/2020  . RADIOLOGY WITH ANESTHESIA N/A 01/27/2020   Procedure: IR WITH ANESTHESIA;  Surgeon: Radiologist, Medication, MD;  Location: [facility];  Service: Radiology;  Laterality: N/A;  . TEE WITHOUT CARDIOVERSION N/A 03/11/2020   Procedure: TRANSESOPHAGEAL ECHOCARDIOGRAM (TEE);  Surgeon: [name], MD;  Location: [facility];  Service: Cardiovascular;  Laterality: N/A;  . TONSILLECTOMY    . WISDOM TOOTH EXTRACTION      There were no vitals filed for this visit.   Subjective Assessment - 10/10/20 1237    Subjective Reports that he feels like the Botox is affecting his leg badly.    Pertinent History PMH: HTN, paroxysmal a fib    Limitations Standing;Walking    Patient Stated Goals "wants everything working on R side again"    Currently in Pain? No/denies                             [facility] Adult PT Treatment/Exercise - 10/10/20 1455      Ambulation/Gait   Ambulation/Gait Yes    Ambulation/Gait Assistance 4: Min guard;4: Min assist    Ambulation/Gait Assistance Details into clinic with [place] with 6 prong tip and to speech therapy office. cues intermittently for posture  Ambulation Distance (Feet) 100 Feet   x2   Assistive device Straight cane   with 6 prong tip   Gait Pattern Step-through pattern;Decreased hip/knee flexion - right;Poor foot clearance - right    Ambulation Surface Level;Indoor    Gait Comments gait with SPC with 6 prong tip over blue/red compliant mats on floor, pt with shorter steps when on unlevel surfaces, needing one episode of min A for balance on blue mat, performed x6 reps total      Exercises   Exercises Other Exercises    Other Exercises  with LUE support, pt tapping RLE on to 6" step and then back to ground 2 x 5 reps, with tactile/manual cues to  prevent hip hike on RLE, cues to keep L foot planted on ground when performing (tendency for LLE to go into PF). x10 reps tapping RLE onto 2" bolster with single UE supprort      Knee/Hip Exercises: Aerobic   Other Aerobic Sci-Fit x 7 min level 3.7 with BLE only with verbal cues to focus on keeping right hip in neutral position      Knee/Hip Exercises: Standing   Forward Step Up Left;1 set;10 reps;Hand Hold: 1;Step Height: 6"    Forward Step Up Limitations with focus on RLE clearance when stepping on step and then stepping backwards off               Balance Exercises - 10/10/20 0001      Balance Exercises: Standing   Standing Eyes Closed Wide (BOA);Foam/compliant surface    Standing Eyes Closed Limitations on red balance beam - 4 x 15 reps with min guard/min A and intermittent touch to bars for balance    Stepping Strategy Anterior;Posterior;Foam/compliant surface;UE support;Limitations    Stepping Strategy Limitations on red balance beam with LUE support: stepping L foot off forwards x15 reps, performed with stepping LLE off posteriorly x15 reps, cues for weight shift onto RLE    Other Standing Exercises in // bars: retro gait x4 reps with single UE support, cues for posture and hip extension with RLE, side stepping x4 reps to R side with focus on keeping R hip in neutral and cues for keeping toes pointed forwards               PT Short Term Goals - 09/23/20 0903      PT SHORT TERM GOAL #1   Title Pt will decrease TUG from 21 sec to <18 sec for improved balance and functional mobility.    Baseline 08/26/20 21 sec with RW, 16.9 seconds with RW, 17.4 with SPC with quad tip    Time 4    Period Weeks    Status Achieved    Target Date 09/25/20      PT SHORT TERM GOAL #2   Title Pt will ambulate >800' on varied surfaces with RW/right hand grip attachment and AFO supervision for improved short community distances.    Baseline 600' CGA with walker - supervision/CGA at times when  ambulating over pavement, was able to ambulate 900'    Time 4    Period Weeks    Status Partially Met    Target Date 09/25/20             PT Long Term Goals - 08/26/20 1000      PT LONG TERM GOAL #1   Title Pt and pt's family members will be independent with final HEP in order to build upon functional gains made in therapy.  ALL LTGS DUE3/15/22    Baseline PT continues to update HEP as pt is progressing. Most recent update 08/26/20    Time 8    Period Weeks    Status On-going    Target Date 10/25/20      PT LONG TERM GOAL #2   Title Pt will ambulate 250' with cane and right AFO CGA for improved household mobility on level surfaces.    Time 8    Period Weeks    Status New    Target Date 10/25/20      PT LONG TERM GOAL #3   Title Pt will increase Berg from 38 to >42/56 for improved balance and decreased fall risk.    Baseline 08/26/20 38/56    Time 8    Period Weeks    Status New    Target Date 10/25/20      PT LONG TERM GOAL #4   Title Pt will ambulate up/down 2 steps with LRAD supervision for improved access in home to living room    Baseline CGA with RW    Time 8    Period Weeks    Status New    Target Date 10/25/20      PT LONG TERM GOAL #5   Title Pt will increase gait speed from 0.18ms to >0.735m for improved community mobility.    Baseline 08/26/20 0.5979m   Time 8    Period Weeks    Status New    Target Date 10/25/20                 Plan - 10/10/20 1506    Clinical Impression Statement Pt challenged today on compliant surfaces with gait training with SPC with 6 prong tip, needing min A at times for balance, pt taking smaller steps and cues for R foot clearance. Continued to focus on RLE hip flexion strengthening with pt needing verbal/tactile cues to help with compensatory motions. will continue to progress towards LTGs.    Personal Factors and Comorbidities Comorbidity 3+    Comorbidities dense L MCA CVA, paroxysmal A. fib on chronic Xarelto,  nonischemic cardiomyopathy and hypertension.    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 gait training with cane with obstacles,  stair training, step ups/modified curb training.  RLE NMR with decr UE support, balance on compliant surfaces.    PT Home Exercise Plan on 06/28/20 - added standing at countetop and stepping LLE out and in    Consulted and Agree with Plan of Care Patient;Family member/caregiver    Family Member Consulted [name]           Patient will benefit from skilled therapeutic intervention in order to improve the following deficits and impairments:  Abnormal gait,Decreased activity tolerance,Decreased balance,Decreased cognition,Decreased mobility,Decreased coordination,Decreased range of motion,Decreased endurance,Decreased strength,Hypomobility,Difficulty walking,Impaired UE functional use,Impaired vision/preception,Postural dysfunction,Impaired sensation  Visit Diagnosis: Other abnormalities of gait and mobility  Muscle weakness (generalized)  Unsteadiness on feet  Other symptoms and signs involving the nervous system  Difficulty  in walking, not elsewhere classified     Problem  List Patient Active Problem List   Diagnosis Date Noted  . Benign essential HTN 06/09/2020  . Abnormality of gait 04/28/2020  . Neurogenic bladder 03/17/2020  . Sepsis due to pneumonia ([place]) 03/06/2020  . History of stroke with residual deficit 03/06/2020  . Transient hypotension 03/06/2020  . Spastic hemiplegia affecting nondominant side ([place])   . History of hypertension   . Expressive aphasia   . AKI (acute kidney injury) ([place])   . Global aphasia   . Hypoalbuminemia due to protein-calorie malnutrition ([place])   . Dysphagia, post-stroke   . Hyperlipidemia 02/02/2020  . Dysphagia following cerebral infarction 02/02/2020  . Aspiration pneumonia ([place]) 02/02/2020  . Acute ischemic left MCA stroke ([place]) 02/02/2020  . Acute respiratory failure ([place])   . Acute ischemic stroke [facility]) s/p clot retrieval L MCA & ACA A3 01/27/2020  . Aspiration into airway 01/27/2020  . Chronic anticoagulation 01/27/2020  . Paroxysmal atrial fibrillation ([place]) 06/15/2018  . Essential hypertension 06/15/2018  . NICM (nonischemic cardiomyopathy) ([place]) 06/15/2018  . Visit for monitoring Tikosyn therapy 06/12/2018    Arliss Journey, PT, DPT  10/10/2020, 3:07 PM  [facility] [address] [place], [place], [address] Phone: [phone]   Fax:  [phone]  Name: MARQUIES WANAT MRN: [id number] Date of Birth: [birth year]

## 2020-10-13 ENCOUNTER — Ambulatory Visit: Payer: BC Managed Care – PPO

## 2020-10-13 ENCOUNTER — Other Ambulatory Visit: Payer: Self-pay

## 2020-10-13 ENCOUNTER — Encounter: Payer: Self-pay | Admitting: Physical Therapy

## 2020-10-13 ENCOUNTER — Ambulatory Visit: Payer: BC Managed Care – PPO | Attending: Physician Assistant | Admitting: Physical Therapy

## 2020-10-13 DIAGNOSIS — M6281 Muscle weakness (generalized): Secondary | ICD-10-CM | POA: Insufficient documentation

## 2020-10-13 DIAGNOSIS — R4701 Aphasia: Secondary | ICD-10-CM | POA: Insufficient documentation

## 2020-10-13 DIAGNOSIS — R2681 Unsteadiness on feet: Secondary | ICD-10-CM | POA: Diagnosis present

## 2020-10-13 DIAGNOSIS — I69351 Hemiplegia and hemiparesis following cerebral infarction affecting right dominant side: Secondary | ICD-10-CM | POA: Insufficient documentation

## 2020-10-13 DIAGNOSIS — R482 Apraxia: Secondary | ICD-10-CM | POA: Insufficient documentation

## 2020-10-13 DIAGNOSIS — R262 Difficulty in walking, not elsewhere classified: Secondary | ICD-10-CM | POA: Insufficient documentation

## 2020-10-13 DIAGNOSIS — R2689 Other abnormalities of gait and mobility: Secondary | ICD-10-CM | POA: Diagnosis present

## 2020-10-13 DIAGNOSIS — R29818 Other symptoms and signs involving the nervous system: Secondary | ICD-10-CM | POA: Diagnosis present

## 2020-10-13 NOTE — Therapy (Addendum)
[facility] [address] [place], [place], [address] Phone: [phone]   Fax:  [phone]  Physical Therapy Treatment  Patient Details  Name: Dustin Schneider MRN: [id number] Date of Birth: 09-12-1955 Referring Provider (PT): Lauraine Rinne, PA-C but followed by Delice Lesch   Encounter Date: 10/13/2020   PT End of Session - 10/13/20 1024    Visit Number 54    Number of Visits 3    Date for PT Re-Evaluation 10/25/20    Authorization Type BCBS    PT Start Time 0933    PT Stop Time 1015    PT Time Calculation (min) 42 min    Equipment Utilized During Treatment Gait belt    Activity Tolerance Patient tolerated treatment well    Behavior During Therapy [facility] for tasks assessed/performed           Past Medical History:  Diagnosis Date  . Hypertension   . NICM (nonischemic cardiomyopathy) ([place]) 06/15/2018   04/23/18: EF 45%, 09/15/18: NOrmal LVEF  . Paroxysmal atrial fibrillation ([place]) 06/15/2018  . Stroke ([place])   . Visit for monitoring Tikosyn therapy 06/12/2018    Past Surgical History:  Procedure Laterality Date  . BUBBLE STUDY  03/11/2020   Procedure: BUBBLE STUDY;  Surgeon: [name], MD;  Location: [facility];  Service: Cardiovascular;;  . CARDIOVERSION N/A 05/13/2018   Procedure: CARDIOVERSION;  Surgeon: [name], MD;  Location: [facility] ENDOSCOPY;  Service: Cardiovascular;  Laterality: N/A;  . CARDIOVERSION N/A 06/13/2018   Procedure: CARDIOVERSION;  Surgeon: [name], MD;  Location: [facility] ENDOSCOPY;  Service: Cardiovascular;  Laterality: N/A;  . CARDIOVERSION N/A 06/23/2018   Procedure: CARDIOVERSION;  Surgeon: [name], MD;  Location: [facility] ENDOSCOPY;  Service: Cardiovascular;  Laterality: N/A;  . CARDIOVERSION N/A 03/11/2020   Procedure: CARDIOVERSION;  Surgeon: [name], MD;  Location: [facility];  Service: Cardiovascular;  Laterality: N/A;  . excision of actinic keratosis    . EYE SURGERY     eye  lift  . IR CT HEAD LTD  01/27/2020  . IR PERCUTANEOUS ART THROMBECTOMY/INFUSION INTRACRANIAL INC DIAG ANGIO  01/27/2020      . IR PERCUTANEOUS ART THROMBECTOMY/INFUSION INTRACRANIAL INC DIAG ANGIO  01/27/2020  . RADIOLOGY WITH ANESTHESIA N/A 01/27/2020   Procedure: IR WITH ANESTHESIA;  Surgeon: Radiologist, Medication, MD;  Location: [facility];  Service: Radiology;  Laterality: N/A;  . TEE WITHOUT CARDIOVERSION N/A 03/11/2020   Procedure: TRANSESOPHAGEAL ECHOCARDIOGRAM (TEE);  Surgeon: [name], MD;  Location: [facility];  Service: Cardiovascular;  Laterality: N/A;  . TONSILLECTOMY    . WISDOM TOOTH EXTRACTION      There were no vitals filed for this visit.   Subjective Assessment - 10/13/20 0936    Subjective Went up the stairs to his office last week with [name]. Walked in with his cane today, is with caregiver Jodie.    Pertinent History PMH: HTN, paroxysmal a fib    Limitations Standing;Walking    Patient Stated Goals "wants everything working on R side again"    Currently in Pain? No/denies                             10/13/20 0949  Ambulation/Gait  Ambulation/Gait Yes  Ambulation/Gait Assistance 4: Min guard;4: Min assist  Ambulation/Gait Assistance Details outdoors over pavement with SPC with 6 prong tip, intermittent cues for focus on foot clearance/placement when walking over uneven cracks in pavement. Verbal cues  and tactile cues at chest at times for posture. Pt with intermittent R foot scuffing towards edge of bout of gait but no LOB. Plus ambulated additional distances indoors over level ground with SPC with 6 prong tip with min guard (between activities) and to speech therapy and from waiting room.  Ambulation Distance (Feet) 500 Feet  Assistive device Straight cane (with 6 prong tip)  Gait Pattern Step-through pattern;Decreased hip/knee flexion - right;Poor foot clearance - right  Ambulation Surface Level;Indoor;Outdoor;Paved;Unlevel  Curb 4: Min  assist;Other (comment) (close CGA)  Curb Details (indicate cue type and reason) x6 reps from 4" step - stepping up first with LLE (no instances of getting R foot stuck), then x6 reps from 6" step - pt able to perform first 4 reps without getting R foot stuck, pt needing to give pt min A to help clear R foot during last 2 reps due to fatigue  Therapeutic Activites   Therapeutic Activities Other Therapeutic Activities  Other Therapeutic Activities discussed safety with walking into therapy session with SPC with 6 prong tip (as pt ambulated in today without prior clearance from PT), discussed pt is making improvements but need further practice with cane and stair/curb negotiation (pt has 2 steps outdoors with no railing in order to get back into house) in order to safely be able to walk farther outdoor distances with cane. discussed today when going home using the RW from the car to get into the 2 steps as when pt fatigued from therapy/walking longer distances, pt with difficulty clearing RLE onto step and would need assist to help safely clear it. both pt and caregiver verbalized understanding        Balance Exercises - 10/13/20 0001      Balance Exercises: Standing   Rockerboard Anterior/posterior;EO    Rockerboard Limitations holding board steady x30 seconds, 2 x 10 reps head turns with min guard/min A, cues to stand tall through RLE    Other Standing Exercises with LUE support and RLE on rockerboard in A/P direction x15 reps step ups with LLE, cues for full weight shift over RLE             PT Education - 10/13/20 1024    Education Details see therapeutic activity.    Person(s) Educated Patient;Caregiver(s)    Methods Explanation    Comprehension Verbalized understanding            PT Short Term Goals - 09/23/20 0903      PT SHORT TERM GOAL #1   Title Pt will decrease TUG from 21 sec to <18 sec for improved balance and functional mobility.    Baseline 08/26/20 21 sec with RW,  16.9 seconds with RW, 17.4 with SPC with quad tip    Time 4    Period Weeks    Status Achieved    Target Date 09/25/20      PT SHORT TERM GOAL #2   Title Pt will ambulate >800' on varied surfaces with RW/right hand grip attachment and AFO supervision for improved short community distances.    Baseline 600' CGA with walker - supervision/CGA at times when ambulating over pavement, was able to ambulate 900'    Time 4    Period Weeks    Status Partially Met    Target Date 09/25/20             PT Long Term Goals - 08/26/20 1000      PT LONG TERM GOAL #1   Title Pt  and pt's family members will be independent with final HEP in order to build upon functional gains made in therapy. ALL LTGS DUE3/15/22    Baseline PT continues to update HEP as pt is progressing. Most recent update 08/26/20    Time 8    Period Weeks    Status On-going    Target Date 10/25/20      PT LONG TERM GOAL #2   Title Pt will ambulate 250' with cane and right AFO CGA for improved household mobility on level surfaces.    Time 8    Period Weeks    Status New    Target Date 10/25/20      PT LONG TERM GOAL #3   Title Pt will increase Berg from 38 to >42/56 for improved balance and decreased fall risk.    Baseline 08/26/20 38/56    Time 8    Period Weeks    Status New    Target Date 10/25/20      PT LONG TERM GOAL #4   Title Pt will ambulate up/down 2 steps with LRAD supervision for improved access in home to living room    Baseline CGA with RW    Time 8    Period Weeks    Status New    Target Date 10/25/20      PT LONG TERM GOAL #5   Title Pt will increase gait speed from 0.62ms to >0.724m for improved community mobility.    Baseline 08/26/20 0.5912m   Time 8    Period Weeks    Status New    Target Date 10/25/20                 10/17/20 0753  Plan  Clinical Impression Statement Pt able to ambulate 500' outdoors over paved surfaces today with SPC with 6 prong tip with min guard/min A. Also  able to progress curb/stair training today with pt able to perform a 6" curb for approx. 4 reps with pt able to clear RLE on his own. The last 2 reps, pt needing min A to help clear RLE. Pt is making steady progress, will continue to progress towards LTGs.  Personal Factors and Comorbidities Comorbidity 3+  Comorbidities dense L MCA CVA, paroxysmal A. fib on chronic Xarelto, nonischemic cardiomyopathy and hypertension.  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 in order to improve on the following deficits Abnormal gait;Decreased activity tolerance;Decreased balance;Decreased cognition;Decreased mobility;Decreased coordination;Decreased range of motion;Decreased endurance;Decreased strength;Hypomobility;Difficulty walking;Impaired UE functional use;Impaired vision/preception;Postural dysfunction;Impaired sensation  Stability/Clinical Decision Making Evolving/Moderate complexity  Rehab Potential Good  PT Frequency 2x / week  PT Duration 8 weeks  PT Treatment/Interventions ADLs/Self Care Home Management;Aquatic Therapy;DME Instruction;Gait training;Stair training;Functional mobility training;Therapeutic activities;Therapeutic exercise;Electrical Stimulation;Balance training;Neuromuscular re-education;Wheelchair mobility training;Orthotic Fit/Training;Patient/family education;Passive range of motion;Energy conservation  PT Next Visit Plan try curbs when stepping up with RLE? need to check goals and discuss re-cert. continue R hip flexion strengthening. gait training with cane with obstacles and outdoors,  stair training, step ups/modified curb training.  RLE NMR with decr UE support, balance on compliant surfaces.  PT Home Exercise Plan on 06/28/20  - added standing at countetop and stepping LLE out and in  Consulted and Agree with Plan of Care Patient;Family member/caregiver  Family Member Consulted Dustin Schneider      Patient will benefit from skilled therapeutic intervention in order to improve  the following deficits and impairments:     Visit Diagnosis: Muscle weakness (generalized)  Other abnormalities of gait and mobility  Unsteadiness on feet  Other symptoms and signs involving the nervous system     Problem List Patient Active Problem List   Diagnosis Date Noted  . Benign essential HTN 06/09/2020  . Abnormality of gait 04/28/2020  . Neurogenic bladder 03/17/2020  . Sepsis due to pneumonia ([place]) 03/06/2020  . History of stroke with residual deficit 03/06/2020  . Transient hypotension 03/06/2020  . Spastic hemiplegia affecting nondominant side ([place])   . History of hypertension   . Expressive aphasia   . AKI (acute kidney injury) ([place])   . Global aphasia   . Hypoalbuminemia due to protein-calorie malnutrition ([place])   . Dysphagia, post-stroke   . Hyperlipidemia 02/02/2020  . Dysphagia following cerebral infarction 02/02/2020  . Aspiration pneumonia ([place]) 02/02/2020  . Acute ischemic left MCA stroke ([place]) 02/02/2020  . Acute respiratory failure ([place])   . Acute ischemic stroke [facility]) s/p clot retrieval L MCA & ACA A3 01/27/2020  . Aspiration into airway 01/27/2020  . Chronic anticoagulation 01/27/2020  . Paroxysmal atrial fibrillation ([place]) 06/15/2018  . Essential hypertension 06/15/2018  . NICM (nonischemic cardiomyopathy) ([place]) 06/15/2018  . Visit for monitoring Tikosyn therapy 06/12/2018    Arliss Journey, PT, DPT  10/13/2020, 10:25 AM  [facility] [address] [place], [place], [address] Phone: [phone]   Fax:  [phone]  Name: Dustin Schneider MRN: [id number] Date of Birth: May 23, 1956

## 2020-10-13 NOTE — Therapy (Signed)
[facility] [address] [place], [place], [address] Phone: [phone]   Fax:  [phone]  Speech Language Pathology Treatment  Patient Details  Name: Dustin Schneider MRN: [id number] Date of Birth: March 22, 1956 Referring Provider (SLP): Dr. Delice Lesch   Encounter Date: 10/13/2020   End of Session - 10/13/20 1214    Visit Number 33    Number of Visits 40    Date for SLP Re-Evaluation 11/04/20    SLP Start Time 0277    SLP Stop Time  1100    SLP Time Calculation (min) 45 min    Activity Tolerance Patient tolerated treatment well           Past Medical History:  Diagnosis Date  . Hypertension   . NICM (nonischemic cardiomyopathy) ([place]) 06/15/2018   04/23/18: EF 45%, 09/15/18: NOrmal LVEF  . Paroxysmal atrial fibrillation ([place]) 06/15/2018  . Stroke ([place])   . Visit for monitoring Tikosyn therapy 06/12/2018    Past Surgical History:  Procedure Laterality Date  . BUBBLE STUDY  03/11/2020   Procedure: BUBBLE STUDY;  Surgeon: [name], MD;  Location: [facility];  Service: Cardiovascular;;  . CARDIOVERSION N/A 05/13/2018   Procedure: CARDIOVERSION;  Surgeon: [name], MD;  Location: [facility] ENDOSCOPY;  Service: Cardiovascular;  Laterality: N/A;  . CARDIOVERSION N/A 06/13/2018   Procedure: CARDIOVERSION;  Surgeon: [name], MD;  Location: [facility] ENDOSCOPY;  Service: Cardiovascular;  Laterality: N/A;  . CARDIOVERSION N/A 06/23/2018   Procedure: CARDIOVERSION;  Surgeon: [name], MD;  Location: [facility] ENDOSCOPY;  Service: Cardiovascular;  Laterality: N/A;  . CARDIOVERSION N/A 03/11/2020   Procedure: CARDIOVERSION;  Surgeon: [name], MD;  Location: [facility];  Service: Cardiovascular;  Laterality: N/A;  . excision of actinic keratosis    . EYE SURGERY     eye lift  . IR CT HEAD LTD  01/27/2020  . IR PERCUTANEOUS ART THROMBECTOMY/INFUSION INTRACRANIAL INC DIAG ANGIO  01/27/2020      . IR PERCUTANEOUS ART  THROMBECTOMY/INFUSION INTRACRANIAL INC DIAG ANGIO  01/27/2020  . RADIOLOGY WITH ANESTHESIA N/A 01/27/2020   Procedure: IR WITH ANESTHESIA;  Surgeon: Radiologist, Medication, MD;  Location: [facility];  Service: Radiology;  Laterality: N/A;  . TEE WITHOUT CARDIOVERSION N/A 03/11/2020   Procedure: TRANSESOPHAGEAL ECHOCARDIOGRAM (TEE);  Surgeon: [name], MD;  Location: [facility];  Service: Cardiovascular;  Laterality: N/A;  . TONSILLECTOMY    . WISDOM TOOTH EXTRACTION      There were no vitals filed for this visit.   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 Average MLU 3-4 words with intermittent phonemic errors ("jib" for ship) and frequent pausing. Pt able to effectively communicate details of interview with addtional processing time and occasional A from caregiver. Pt noted to use descriptions and occasional rhyming words ("rhymes with beach" for speech). Pt used writing x2 to aid communication. Pt detailed his morning schedule, with pt now becoming  more independent. Today was 4th day without overnight caregiver. SLP targeted emergency number to dial (pt verbalized/wrote [phone] instead of 911 but able to type 911 correctly). SLP recommended to caregiver to write and practice functional phrases that [name] would use to communication if an emergency occurs.      Assessment / Recommendations / Plan   Plan Continue with current plan of care      Progression Toward Goals   Progression toward goals  Progressing toward goals            SLP Education - 10/13/20 1214    Education Details emergency phrases for [name] to practice    Person(s) Educated Patient;Caregiver(s)    Methods Explanation;Demonstration    Comprehension Verbalized understanding;Returned demonstration;Need further instruction            SLP Short Term Goals - 10/13/20 1011      SLP SHORT TERM GOAL #1   Title Pt will name family, friends and art items with occasional min A 8/10 with approximations allowed.    Status Partially Met      SLP SHORT TERM GOAL #2   Title Pt will name 7 items in personally relevant categories with occasional min A over 2 sessions    Status Partially Met      SLP SHORT TERM GOAL #3   Title Pt will perform 4 automatic speech tasks with rare min A.    Status Partially Met      SLP SHORT TERM GOAL #4   Title Pt will write personal information (name/address/phone), 5 family/friend/pet names and 7  professional words with occasional min A    Status Not Met      SLP SHORT TERM GOAL #5   Title Pt will use mulitmodal communication (gesture, draw, write 1st letter etc) to augment verbal expression to meet needs at home with rare min A from family.    Status Partially Met            SLP Long Term Goals - 10/13/20 1011      SLP LONG TERM GOAL #1   Title Pt will verbalize 3 words to explain or describe his artwork and hobbies with occasional min A over 3 sessions, allowing for intellgible approximations    Baseline 05-17-20 (Lingraphica cues), 05-24-20 (lingraphica), 05-31-20 (lingraphica); 07/25/20 (written word)    Status Achieved      SLP LONG TERM GOAL #2   Title Pt and family will utilize multimodal compensations for aphasia and verbal apraxia to participate in 3 turns each of conversation with occasional min A over 2 sessions    Baseline 07/25/20;    Period --    Status Achieved      SLP LONG TERM GOAL #3   Title Pt will write 3 word phrases with less than 2 errors with rare  min A over 3 sessions    Status Not Met      SLP LONG TERM GOAL #4   Title Reading assessment if indicated    Status Deferred      SLP LONG TERM GOAL #5   Title [name] will use multimodal communication spontaneously  (writing, gesture, device) to augment verbal expression as needed 3/5 times with rare min A    Baseline 10-10-20    Time 2   Goal renewed 09-23-20   Period Weeks    Status On-going      SLP LONG TERM GOAL #6   Title [name] will write (approximate) 4 items in personally relevant  category that is understood by family/friends    Baseline 08/22/20; 08/31/20; 09/06/19    Status Achieved      SLP LONG TERM GOAL #7   Title Pt will employ multimodal communication to participate in 6 turns in conversation with extended time and occasional min A    Time --   Goals renewed 09-23-20   Period Weeks    Status Achieved            Plan - 10/13/20 1214    Clinical Impression Statement [name] exhibits moderate Broca's aphasia and severe verbal apraxia impacting communication of wants/needs, with steady improvements noted during ST sessions. [name] was able to verbally explain about his recent interview with use of multimodal communication and occasional A from caregiver. Pt noted with improvements in MLU for verbal output and decreased frequency of phonemic errors. [name] has recently become more independent at home, including staying without overnight caregiver and getting ready in morning by himself. SLP targeted use of functional words/phrases to use in an emergency in order for him to be able to successfully verbalize his needs. Pt would continue to benefit from additional ST visits to increase QOL and effective communication    Speech Therapy Frequency 2x / week    Duration Other (comment)   40 visits   Treatment/Interventions Compensatory strategies;Cueing hierarchy;Functional tasks;Patient/family education;Multimodal communcation approach;SLP instruction and feedback    Potential to Achieve Goals Good     Potential Considerations Severity of impairments    SLP Home Exercise Plan emergency phrases    Consulted and Agree with Plan of Care Patient;Family member/caregiver    Family Member Consulted Jodie           Patient will benefit from skilled therapeutic intervention in order to improve the following deficits and impairments:   Verbal apraxia  Aphasia    Problem List Patient Active Problem List   Diagnosis Date Noted  . Benign essential HTN 06/09/2020  . Abnormality of gait 04/28/2020  . Neurogenic bladder 03/17/2020  . Sepsis due to pneumonia ([place]) 03/06/2020  . History of stroke with residual deficit 03/06/2020  . Transient hypotension 03/06/2020  . Spastic hemiplegia affecting nondominant side ([place])   . History of hypertension   . Expressive aphasia   . AKI (acute kidney injury) ([place])   . Global aphasia   . Hypoalbuminemia due to protein-calorie malnutrition ([place])   . Dysphagia, post-stroke   . Hyperlipidemia 02/02/2020  . Dysphagia following cerebral infarction 02/02/2020  . Aspiration pneumonia ([place]) 02/02/2020  . Acute ischemic left MCA stroke ([place]) 02/02/2020  . Acute respiratory failure ([place])   . Acute ischemic stroke [facility]) s/p clot retrieval L MCA & ACA A3 01/27/2020  . Aspiration into airway 01/27/2020  . Chronic anticoagulation 01/27/2020  . Paroxysmal atrial fibrillation ([place]) 06/15/2018  . Essential hypertension 06/15/2018  . NICM (nonischemic cardiomyopathy) ([place]) 06/15/2018  . Visit for monitoring Tikosyn therapy 06/12/2018    Alinda Deem, [place] CCC-SLP 10/13/2020, 12:19 PM  [facility] [address] [place], [place], [address] Phone: [phone]   Fax:  [phone]   Name: ALFONZIA WOOLUM MRN: [id number] Date of Birth: 08-09-1956

## 2020-10-17 ENCOUNTER — Other Ambulatory Visit: Payer: Self-pay

## 2020-10-17 ENCOUNTER — Encounter: Payer: Self-pay | Admitting: Physical Therapy

## 2020-10-17 ENCOUNTER — Ambulatory Visit: Payer: BC Managed Care – PPO | Admitting: Speech Pathology

## 2020-10-17 ENCOUNTER — Ambulatory Visit: Payer: BC Managed Care – PPO | Admitting: Physical Therapy

## 2020-10-17 DIAGNOSIS — M6281 Muscle weakness (generalized): Secondary | ICD-10-CM | POA: Diagnosis not present

## 2020-10-17 DIAGNOSIS — R2681 Unsteadiness on feet: Secondary | ICD-10-CM

## 2020-10-17 DIAGNOSIS — R29818 Other symptoms and signs involving the nervous system: Secondary | ICD-10-CM

## 2020-10-17 DIAGNOSIS — R482 Apraxia: Secondary | ICD-10-CM

## 2020-10-17 DIAGNOSIS — R4701 Aphasia: Secondary | ICD-10-CM

## 2020-10-17 DIAGNOSIS — R2689 Other abnormalities of gait and mobility: Secondary | ICD-10-CM

## 2020-10-17 DIAGNOSIS — R262 Difficulty in walking, not elsewhere classified: Secondary | ICD-10-CM

## 2020-10-17 NOTE — Patient Instructions (Signed)
    Call [facility] 9-1-1 [phone] and let them know you have aphasia and may not be able to communicate easily in an emergency  [place] your friends' spouses and childrens names so you can ask about them. Also practice their jobs or hobbies so you can ask questions and show you are interested  Bring this in for accountability  Remember, we pay good money for Spanish, [place] and ASL interpreters - go easy on [name]  The stroke affected by right side and my speech. It has affected me profoundly.  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

## 2020-10-17 NOTE — Therapy (Signed)
[facility] [address] [place], [place], [address] Phone: [phone]   Fax:  [phone]  Physical Therapy Treatment  Patient Details  Name: Dustin Schneider MRN: [id number] Date of Birth: Jun 23, 1956 Referring Provider (PT): Lauraine Rinne, PA-C but followed by Delice Lesch   Encounter Date: 10/17/2020   PT End of Session - 10/17/20 1246    Visit Number 41    Number of Visits 37    Date for PT Re-Evaluation 10/25/20    Authorization Type BCBS    PT Start Time 1147    PT Stop Time 1228    PT Time Calculation (min) 41 min    Equipment Utilized During Treatment Gait belt    Activity Tolerance Patient tolerated treatment well    Behavior During Therapy [facility] for tasks assessed/performed           Past Medical History:  Diagnosis Date  . Hypertension   . NICM (nonischemic cardiomyopathy) ([place]) 06/15/2018   04/23/18: EF 45%, 09/15/18: NOrmal LVEF  . Paroxysmal atrial fibrillation ([place]) 06/15/2018  . Stroke ([place])   . Visit for monitoring Tikosyn therapy 06/12/2018    Past Surgical History:  Procedure Laterality Date  . BUBBLE STUDY  03/11/2020   Procedure: BUBBLE STUDY;  Surgeon: [name], MD;  Location: [place];  Service: Cardiovascular;;  . CARDIOVERSION N/A 05/13/2018   Procedure: CARDIOVERSION;  Surgeon: [name], MD;  Location: [facility] ENDOSCOPY;  Service: Cardiovascular;  Laterality: N/A;  . CARDIOVERSION N/A 06/13/2018   Procedure: CARDIOVERSION;  Surgeon: [name], MD;  Location: [facility] ENDOSCOPY;  Service: Cardiovascular;  Laterality: N/A;  . CARDIOVERSION N/A 06/23/2018   Procedure: CARDIOVERSION;  Surgeon: [name], MD;  Location: [facility] ENDOSCOPY;  Service: Cardiovascular;  Laterality: N/A;  . CARDIOVERSION N/A 03/11/2020   Procedure: CARDIOVERSION;  Surgeon: [name], MD;  Location: [place];  Service: Cardiovascular;  Laterality: N/A;  . excision of actinic keratosis    . EYE SURGERY     eye  lift  . IR CT HEAD LTD  01/27/2020  . IR PERCUTANEOUS ART THROMBECTOMY/INFUSION INTRACRANIAL INC DIAG ANGIO  01/27/2020      . IR PERCUTANEOUS ART THROMBECTOMY/INFUSION INTRACRANIAL INC DIAG ANGIO  01/27/2020  . RADIOLOGY WITH ANESTHESIA N/A 01/27/2020   Procedure: IR WITH ANESTHESIA;  Surgeon: Radiologist, Medication, MD;  Location: [place];  Service: Radiology;  Laterality: N/A;  . TEE WITHOUT CARDIOVERSION N/A 03/11/2020   Procedure: TRANSESOPHAGEAL ECHOCARDIOGRAM (TEE);  Surgeon: [name], MD;  Location: [place];  Service: Cardiovascular;  Laterality: N/A;  . TONSILLECTOMY    . WISDOM TOOTH EXTRACTION      There were no vitals filed for this visit.   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 Wants to go back to work.    Pertinent History PMH: HTN, paroxysmal a fib    Limitations Standing;Walking    Patient Stated Goals "wants everything working on R side again"    Currently in Pain? No/denies                             [facility] Adult PT Treatment/Exercise - 10/17/20 1206      Ambulation/Gait   Ambulation/Gait Yes    Ambulation/Gait Assistance 4: Min guard    Ambulation/Gait Assistance Details intermittent cues for posture    Ambulation Distance (Feet) 345 Feet   plus additional  distances between sessions and 2 x 100' (into and out of clinic)   Assistive device Straight cane   with 6 prong tip   Gait Pattern Step-through pattern;Decreased hip/knee flexion - right;Poor foot clearance - right    Ambulation Surface Level;Indoor    Gait velocity 14.9 seconds with RW = .67 ft/sec, 18.32 seconds with SPC with 6 prong tip = .54 ft/sec    Stairs Yes    Stairs Assistance 4: Min guard;5: Supervision;4: Min assist    [employer] Details (indicate cue type and reason) when performing with cane, pt needing min guard when ascending with one episode of min A to help with RLE foot clearance on step,  descending performed with min A. with use of railing pt able to perform with supervision. attempted with use of cane stepping up with RLE first to see if it would help with R foot clearance when bringing R foot up, however pt had more difficulty with this technique    Stair Management Technique Step to pattern;One rail Left;With cane    Number of Stairs 12   with SPC with 6 prong tip, x4 with railing   Height of Stairs 6    Curb Other (comment);4: Min assist   CGA   Curb Details (indicate cue type and reason) x2 reps from 6" curb, one instance of pt needing little assistance to clear R foot, performed at end of session      Timed Up and Go Test   TUG Normal TUG    Normal TUG (seconds) 17.4   with SPC with 6 prong tip     Therapeutic Activites    Therapeutic Activities Other Therapeutic Activities    Other Therapeutic Activities pt's gf [name] asking about use of apple watch for safety in case of fall, discussed in newer models there is a feature with fall detection and pt can set up emergency contacts and that this would be a good idea for safety at home. 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 discussed with pt will need to reach out to the school for any accomodations                 PT Education - 10/17/20 1241    Education Details see therapeutic activity. progress towards goals - recert to be performed for an additional 2x week for 8 weeks based on progress (pt being scheduled out for more visits)    Person(s) Educated Patient   pt's gf   Methods Explanation    Comprehension Verbalized understanding            PT Short Term Goals - 09/23/20 0903      PT SHORT TERM GOAL #1   Title Pt will decrease TUG from 21 sec to <18 sec for improved  balance and functional mobility.    Baseline 08/26/20 21 sec with RW, 16.9 seconds with RW, 17.4 with SPC with quad tip    Time 4    Period Weeks    Status Achieved    Target Date 09/25/20      PT SHORT TERM GOAL #2   Title Pt will ambulate >800' on varied surfaces with RW/right hand grip attachment and  AFO supervision for improved short community distances.    Baseline 600' CGA with walker - supervision/CGA at times when ambulating over pavement, was able to ambulate 900'    Time 4    Period Weeks    Status Partially Met    Target Date 09/25/20             PT Long Term Goals - 10/17/20 1208      PT LONG TERM GOAL #1   Title Pt and pt's family members will be independent with final HEP in order to build upon functional gains made in therapy. ALL LTGS DUE3/15/22    Baseline PT continues to update HEP as pt is progressing. Most recent update 08/26/20    Time 8    Period Weeks    Status On-going      PT LONG TERM GOAL #2   Title Pt will ambulate 250' with cane and right AFO CGA for improved household mobility on level surfaces.    Baseline 345' with cane with 6 prong tip on 10/17/20 with CGA    Time 8    Period Weeks    Status Achieved      PT LONG TERM GOAL #3   Title Pt will increase Berg from 38 to >42/56 for improved balance and decreased fall risk.    Baseline 08/26/20 38/56    Time 8    Period Weeks    Status New      PT LONG TERM GOAL #4   Title Pt will ambulate up/down 2 steps with LRAD supervision for improved access in home to living room    Baseline when performing curb with cane needs min guard/min A at times for R foot clearance, 4 steps with cane min guard ascending, min A descending    Time 8    Period Weeks    Status Not Met      PT LONG TERM GOAL #5   Title Pt will increase gait speed from 0.66ms to >0.743m for improved community mobility.    Baseline 08/26/20 0.5960m 14.9 seconds with RW = .67 m/s    Time 8    Period Weeks    Status Partially Met                Plan - 10/17/20 1259    Clinical Impression Statement Discussed with pt and pt's gf with pt's progress in PT and pt's continued stair/walking goals with cane, plan to re-cert for an additional 2x week for 8 weeks at end of POC. Began to check LTGs today. Pt improved gait speed with RW to .67 m/s (previously .59 m/s) - just about at goal level, indicating pt is a limited community ambulator. Pt able to ambulate 345' with SPC with 6 prong tip with CGA over level indoor surfaces. Pt needing min guard with SPC when performing a curb with occassional min A for RLE foot clearance. Pt is getting much better at clearing R foot with 6" step, but sometimes does need min A for safety when more fatigued. Pt is making excellent progress,will plan to re-cert to continue working on strength, balance, gait, functional transfers, and stairs in order to improve functional mobility and independence.    Personal Factors and Comorbidities Comorbidity 3+    Comorbidities dense L MCA CVA, paroxysmal A. fib on chronic Xarelto, nonischemic cardiomyopathy and hypertension.    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, works as  an Gaffer   Stability/Clinical Decision Making Evolving/Moderate complexity    Rehab Potential Good    PT Frequency 2x / week    PT Duration 8 weeks    PT Treatment/Interventions ADLs/Self Care Home Management;Aquatic Therapy;DME Instruction;Gait training;Stair training;Functional mobility training;Therapeutic activities;Therapeutic exercise;Electrical Stimulation;Balance training;Neuromuscular re-education;Wheelchair mobility training;Orthotic Fit/Training;Patient/family education;Passive range of motion;Energy conservation    PT Next Visit Plan check remainder of LTGs and recert. TUG goal and gait  speed goal with cane as well. continue R hip flexion strengthening. gait training with cane outdoors,  stair and curb training with cane.  RLE NMR with decr UE support, balance on compliant surfaces.    PT Home Exercise Plan on 06/28/20 - added standing at countetop and stepping LLE out and in    Consulted and Agree with Plan of Care Patient;Family member/caregiver    Family Member Consulted [name]           Patient will benefit from skilled therapeutic intervention in order to improve the following deficits and impairments:  Abnormal gait,Decreased activity tolerance,Decreased balance,Decreased cognition,Decreased mobility,Decreased coordination,Decreased range of motion,Decreased endurance,Decreased strength,Hypomobility,Difficulty walking,Impaired UE functional use,Impaired vision/preception,Postural dysfunction,Impaired sensation  Visit Diagnosis: Muscle weakness (generalized)  Other abnormalities of gait and mobility  Unsteadiness on feet  Other symptoms and signs involving the nervous system  Difficulty in walking, not elsewhere classified     Problem List Patient Active Problem List   Diagnosis Date Noted  . Benign essential HTN 06/09/2020  . Abnormality of gait 04/28/2020  . Neurogenic bladder 03/17/2020  . Sepsis due to pneumonia ([place]) 03/06/2020  . History of stroke with residual deficit 03/06/2020  . Transient hypotension 03/06/2020  . Spastic hemiplegia affecting nondominant side ([place])   . History of hypertension   . Expressive aphasia   . AKI (acute kidney injury) ([place])   . Global aphasia   . Hypoalbuminemia due to protein-calorie malnutrition ([place])   . Dysphagia, post-stroke   . Hyperlipidemia 02/02/2020  . Dysphagia following cerebral infarction 02/02/2020  . Aspiration pneumonia ([place]) 02/02/2020  . Acute ischemic left MCA stroke ([place]) 02/02/2020  . Acute respiratory failure ([place])   . Acute ischemic stroke [facility]) s/p clot retrieval L MCA & ACA A3 01/27/2020   . Aspiration into airway 01/27/2020  . Chronic anticoagulation 01/27/2020  . Paroxysmal atrial fibrillation ([place]) 06/15/2018  . Essential hypertension 06/15/2018  . NICM (nonischemic cardiomyopathy) ([place]) 06/15/2018  . Visit for monitoring Tikosyn therapy 06/12/2018    Arliss Journey, PT, DPT  10/17/2020, 1:07 PM  [facility] [address] [place], [place], [address] Phone: [phone]   Fax:  [phone]  Name: Dustin Schneider MRN: [id number] Date of Birth: May 17, 1956

## 2020-10-17 NOTE — Therapy (Signed)
[facility] [address] [place], [place], [address] Phone: [phone]   Fax:  [phone]  Speech Language Pathology Treatment  Patient Details  Name: Dustin Schneider MRN: [id number] Date of Birth: [birth year] Referring Provider (SLP): Dr. Delice Schneider   Encounter Date: 10/17/2020   End of Session - 10/17/20 1214    Visit Number 34    Number of Visits 40    Date for SLP Re-Evaluation 11/04/20    SLP Start Time 6222    SLP Stop Time  1145    SLP Time Calculation (min) 43 min    Activity Tolerance Patient tolerated treatment well           Past Medical History:  Diagnosis Date  . Hypertension   . NICM (nonischemic cardiomyopathy) ([place]) 06/15/2018   04/23/18: EF 45%, 09/15/18: NOrmal LVEF  . Paroxysmal atrial fibrillation ([place]) 06/15/2018  . Stroke ([place])   . Visit for monitoring Tikosyn therapy 06/12/2018    Past Surgical History:  Procedure Laterality Date  . BUBBLE STUDY  03/11/2020   Procedure: BUBBLE STUDY;  Surgeon: Dustin Prows, MD;  Location: [place];  Service: Cardiovascular;;  . CARDIOVERSION N/A 05/13/2018   Procedure: CARDIOVERSION;  Surgeon: Dustin Prows, MD;  Location: [facility] ENDOSCOPY;  Service: Cardiovascular;  Laterality: N/A;  . CARDIOVERSION N/A 06/13/2018   Procedure: CARDIOVERSION;  Surgeon: Dustin Hector, MD;  Location: [facility] ENDOSCOPY;  Service: Cardiovascular;  Laterality: N/A;  . CARDIOVERSION N/A 06/23/2018   Procedure: CARDIOVERSION;  Surgeon: Dustin Prows, MD;  Location: [facility] ENDOSCOPY;  Service: Cardiovascular;  Laterality: N/A;  . CARDIOVERSION N/A 03/11/2020   Procedure: CARDIOVERSION;  Surgeon: Dustin Prows, MD;  Location: [place];  Service: Cardiovascular;  Laterality: N/A;  . excision of actinic keratosis    . EYE SURGERY     eye lift  . IR CT HEAD LTD  01/27/2020  . IR PERCUTANEOUS ART THROMBECTOMY/INFUSION INTRACRANIAL INC DIAG ANGIO  01/27/2020      . IR PERCUTANEOUS ART  THROMBECTOMY/INFUSION INTRACRANIAL INC DIAG ANGIO  01/27/2020  . RADIOLOGY WITH ANESTHESIA N/A 01/27/2020   Procedure: IR WITH ANESTHESIA;  Surgeon: Dustin Schneider, Medication, MD;  Location: [place];  Service: Radiology;  Laterality: N/A;  . TEE WITHOUT CARDIOVERSION N/A 03/11/2020   Procedure: TRANSESOPHAGEAL ECHOCARDIOGRAM (TEE);  Surgeon: Dustin Prows, MD;  Location: [place];  Service: Cardiovascular;  Laterality: N/A;  . TONSILLECTOMY    . WISDOM TOOTH EXTRACTION      There were no vitals filed for this visit.   Subjective Assessment - 10/17/20 1105    Subjective "Why didn't you come to my show"    Patient is accompained by: Family member   Dustin Schneider   Currently in Pain? No/denies                 ADULT SLP TREATMENT - 10/17/20 1147      General Information   Behavior/Cognition Cooperative;Pleasant mood      Treatment Provided   Treatment provided Cognitive-Linquistic      Cognitive-Linquistic Treatment   Treatment focused on Aphasia;Apraxia;Patient/family/caregiver education    Skilled Treatment Dustin Schneider has interview today re: his stroke and recover with [employer], We targeted responses to predictable questions re: stroke recovery "The stroke affected my right side and speech" etc. Dustin Schneider had difficulty finding a friend's spouses name. Instructed Dustin Schneider to write down list of friends, including their families, jobs hobbies etc so he can practice and ask his friends specifics about them to show  interest. Dustin Schneider would like to return to 2 classes in the fall. Painting 1 and 2. Generated strategies such as using pre recorded videos of Dustin Schneider teaching and practicing lectures or feed back now and up unitl school starts.      Assessment / Recommendations / Plan   Plan Continue with current plan of care      Progression Toward Goals   Progression toward goals Progressing toward goals            SLP Education - 10/17/20 1210    Education Details continue to pre-practice personal infomation  re: your friends so you can take an interest in them in conversation, try to not rely in [place] when it is not necessary    Person(s) Educated Patient;Caregiver(s)    Methods Explanation;Demonstration;Verbal cues;Handout    Comprehension Verbalized understanding;Verbal cues required            SLP Short Term Goals - 10/17/20 1213      SLP SHORT TERM GOAL #1   Title Pt will name family, friends and art items with occasional min A 8/10 with approximations allowed.    Status Partially Met      SLP SHORT TERM GOAL #2   Title Pt will name 7 items in personally relevant categories with occasional min A over 2 sessions    Status Partially Met      SLP SHORT TERM GOAL #3   Title Pt will perform 4 automatic speech tasks with rare min A.    Status Partially Met      SLP SHORT TERM GOAL #4   Title Pt will write personal information (name/address/phone), 5 family/friend/pet names and 7  professional words with occasional min A    Status Not Met      SLP SHORT TERM GOAL #5   Title Pt will use mulitmodal communication (gesture, draw, write 1st letter etc) to augment verbal expression to meet needs at home with rare min A from family.    Status Partially Met            SLP Long Term Goals - 10/17/20 1213      SLP LONG TERM GOAL #1   Title Pt will verbalize 3 words to explain or describe his artwork and hobbies with occasional min A over 3 sessions, allowing for intellgible approximations    Baseline 05-17-20 (Lingraphica cues), 05-24-20 (lingraphica), 05-31-20 (lingraphica); 07/25/20 (written word)    Status Achieved      SLP LONG TERM GOAL #2   Title Pt and family will utilize multimodal compensations for aphasia and verbal apraxia to participate in 3 turns each of conversation with occasional min A over 2 sessions    Baseline 07/25/20;    Status Achieved      SLP LONG TERM GOAL #3   Title Pt will write 3 word phrases with less than 2 errors with rare min A over 3 sessions    Status  Not Met      SLP LONG TERM GOAL #4   Title Reading assessment if indicated    Status Deferred      SLP LONG TERM GOAL #5   Title Dustin Schneider will use multimodal communication spontaneously  (writing, gesture, device) to augment verbal expression as needed 3/5 times with rare min A    Baseline 10-10-20; 10/17/20    Time 1   Goal renewed 09-23-20   Period Weeks    Status On-going      SLP LONG TERM GOAL #6  Title Dustin Schneider will write (approximate) 4 items in personally relevant category that is understood by family/friends    Baseline 08/22/20; 08/31/20; 09/06/19    Status Achieved      SLP LONG TERM GOAL #7   Title Pt will employ multimodal communication to participate in 6 turns in conversation with extended time and occasional min A    Baseline 10/17/20;    Time --   Goals renewed 09-23-20   Period Weeks    Status Achieved            Plan - 10/17/20 1211    Clinical Impression Statement Dustin Schneider exhibits improving moderate Broca's aphasia and moderate verbal apraxia impacting communication re: details of his artwork,and in moderately complex conversation, with steady improvements noted during ST sessions. Dustin Schneider was able to verbally explain about his recent interview with use of multimodal communication and occasional A from caregiver. Pt noted with improvements in MLU for verbal output and decreased frequency of phonemic errors. Dustin Schneider has recently become more independent at home, including staying without overnight caregiver and getting ready in morning by himself. SLP targeted use of functional words/phrases to use in an emergency in order for him to be able to successfully verbalize his needs. Pt would continue to benefit from additional ST visits to increase QOL and effective communication    Speech Therapy Frequency 2x / week    Duration --   40 visits   Treatment/Interventions Compensatory strategies;Cueing hierarchy;Functional tasks;Patient/family education;Multimodal communcation approach;SLP instruction  and feedback    Potential to Achieve Goals Good           Patient will benefit from skilled therapeutic intervention in order to improve the following deficits and impairments:   Aphasia  Verbal apraxia    Problem List Patient Active Problem List   Diagnosis Date Noted  . Benign essential HTN 06/09/2020  . Abnormality of gait 04/28/2020  . Neurogenic bladder 03/17/2020  . Sepsis due to pneumonia ([place]) 03/06/2020  . History of stroke with residual deficit 03/06/2020  . Transient hypotension 03/06/2020  . Spastic hemiplegia affecting nondominant side ([place])   . History of hypertension   . Expressive aphasia   . AKI (acute kidney injury) ([place])   . Global aphasia   . Hypoalbuminemia due to protein-calorie malnutrition ([place])   . Dysphagia, post-stroke   . Hyperlipidemia 02/02/2020  . Dysphagia following cerebral infarction 02/02/2020  . Aspiration pneumonia ([place]) 02/02/2020  . Acute ischemic left MCA stroke ([place]) 02/02/2020  . Acute respiratory failure ([place])   . Acute ischemic stroke [facility]) s/p clot retrieval L MCA & ACA A3 01/27/2020  . Aspiration into airway 01/27/2020  . Chronic anticoagulation 01/27/2020  . Paroxysmal atrial fibrillation ([place]) 06/15/2018  . Essential hypertension 06/15/2018  . NICM (nonischemic cardiomyopathy) ([place]) 06/15/2018  . Visit for monitoring Tikosyn therapy 06/12/2018    Lovvorn, Annye Rusk MS, CCC-SLP 10/17/2020, 12:14 PM  [facility] [address] [place], [place], [address] Phone: [phone]   Fax:  [phone]   Name: Dustin Schneider MRN: [id number] Date of Birth: 03-Oct-1955

## 2020-10-20 ENCOUNTER — Ambulatory Visit: Payer: BC Managed Care – PPO

## 2020-10-20 ENCOUNTER — Other Ambulatory Visit: Payer: Self-pay

## 2020-10-20 DIAGNOSIS — M6281 Muscle weakness (generalized): Secondary | ICD-10-CM | POA: Diagnosis not present

## 2020-10-20 DIAGNOSIS — I69351 Hemiplegia and hemiparesis following cerebral infarction affecting right dominant side: Secondary | ICD-10-CM

## 2020-10-20 DIAGNOSIS — R4701 Aphasia: Secondary | ICD-10-CM

## 2020-10-20 DIAGNOSIS — R2681 Unsteadiness on feet: Secondary | ICD-10-CM

## 2020-10-20 DIAGNOSIS — R482 Apraxia: Secondary | ICD-10-CM

## 2020-10-20 DIAGNOSIS — R2689 Other abnormalities of gait and mobility: Secondary | ICD-10-CM

## 2020-10-20 NOTE — Therapy (Signed)
[facility] [address] [place], [place], [address] Phone: [phone]   Fax:  [phone]  Physical Therapy Treatment/Recert  Patient Details  Name: Dustin Schneider MRN: [id number] Date of Birth: 11/26/1955 Referring Provider (PT): Lauraine Rinne, PA-C but followed by Delice Lesch   Encounter Date: 10/20/2020   PT End of Session - 10/20/20 0941    Visit Number 61    Number of Visits 19    Date for PT Re-Evaluation --    Authorization Type BCBS    PT Start Time [phone]    PT Stop Time 1017    PT Time Calculation (min) 40 min    Equipment Utilized During Treatment Gait belt    Activity Tolerance Patient tolerated treatment well    Behavior During Therapy [facility] for tasks assessed/performed           Past Medical History:  Diagnosis Date  . Hypertension   . NICM (nonischemic cardiomyopathy) ([place]) 06/15/2018   04/23/18: EF 45%, 09/15/18: NOrmal LVEF  . Paroxysmal atrial fibrillation ([place]) 06/15/2018  . Stroke ([place])   . Visit for monitoring Tikosyn therapy 06/12/2018    Past Surgical History:  Procedure Laterality Date  . BUBBLE STUDY  03/11/2020   Procedure: BUBBLE STUDY;  Surgeon: [name], MD;  Location: [place];  Service: Cardiovascular;;  . CARDIOVERSION N/A 05/13/2018   Procedure: CARDIOVERSION;  Surgeon: [name], MD;  Location: [facility] ENDOSCOPY;  Service: Cardiovascular;  Laterality: N/A;  . CARDIOVERSION N/A 06/13/2018   Procedure: CARDIOVERSION;  Surgeon: [name], MD;  Location: [facility] ENDOSCOPY;  Service: Cardiovascular;  Laterality: N/A;  . CARDIOVERSION N/A 06/23/2018   Procedure: CARDIOVERSION;  Surgeon: [name], MD;  Location: [facility] ENDOSCOPY;  Service: Cardiovascular;  Laterality: N/A;  . CARDIOVERSION N/A 03/11/2020   Procedure: CARDIOVERSION;  Surgeon: [name], MD;  Location: [place];  Service: Cardiovascular;  Laterality: N/A;  . excision of actinic keratosis    . EYE SURGERY     eye  lift  . IR CT HEAD LTD  01/27/2020  . IR PERCUTANEOUS ART THROMBECTOMY/INFUSION INTRACRANIAL INC DIAG ANGIO  01/27/2020      . IR PERCUTANEOUS ART THROMBECTOMY/INFUSION INTRACRANIAL INC DIAG ANGIO  01/27/2020  . RADIOLOGY WITH ANESTHESIA N/A 01/27/2020   Procedure: IR WITH ANESTHESIA;  Surgeon: Radiologist, Medication, MD;  Location: [place];  Service: Radiology;  Laterality: N/A;  . TEE WITHOUT CARDIOVERSION N/A 03/11/2020   Procedure: TRANSESOPHAGEAL ECHOCARDIOGRAM (TEE);  Surgeon: [name], MD;  Location: [place];  Service: Cardiovascular;  Laterality: N/A;  . TONSILLECTOMY    . WISDOM TOOTH EXTRACTION      There were no vitals filed for this visit.   Subjective Assessment - 10/20/20 0941    Subjective Pt reports doing well.    Pertinent History PMH: HTN, paroxysmal a fib    Limitations Standing;Walking    Patient Stated Goals "wants everything working on R side again"    Currently in Pain? No/denies                             [facility] Adult PT Treatment/Exercise - 10/20/20 0942      Transfers   Transfers Sit to Stand;Stand to Sit    Sit to Stand 6: Modified independent (Device/Increase time)    Stand to Sit 6: Modified independent (Device/Increase time)      Ambulation/Gait   Ambulation/Gait Yes    Ambulation/Gait Assistance 5: Supervision;4: Min  guard    Ambulation/Gait Assistance Details around in clinic during session    Assistive device Straight cane   with 6 prong tip, right AFO   Gait Pattern Step-through pattern;Decreased stance time - right;Decreased step length - left;Decreased hip/knee flexion - right    Ambulation Surface Level;Indoor    Gait velocity 18.22 sec=0.52ms or 1.816fsec    Stairs Yes    Stairs Assistance 4: Min guard    Stair Management Technique Step to pattern;With cane    Number of Stairs 8    Height of Stairs 6    Curb Other (comment)   CGA   Curb Details (indicate cue type and reason) x 3 with cane. Pt able to clear right  toes when trailing better each time.    Gait Comments Pt performed gait activities: stop/go on command x 100', 180 degree turns on command x 6 turns with cane. Pt tends to pivot more on left foot when turning to left.      Standardized Balance Assessment   Standardized Balance Assessment Timed Up and Go Test      Berg Balance Test   Sit to Stand Able to stand without using hands and stabilize independently    Standing Unsupported Able to stand safely 2 minutes    Sitting with Back Unsupported but Feet Supported on Floor or Stool Able to sit safely and securely 2 minutes    Stand to Sit Sits safely with minimal use of hands    Transfers Able to transfer safely, definite need of hands    Standing Unsupported with Eyes Closed Able to stand 10 seconds safely    Standing Ubsupported with Feet Together Able to place feet together independently and stand 1 minute safely    From Standing, Reach Forward with Outstretched Arm Can reach confidently >25 cm (10")    From Standing Position, Pick up Object from Floor Able to pick up shoe safely and easily    From Standing Position, Turn to Look Behind Over each Shoulder Looks behind from both sides and weight shifts well    Turn 360 Degrees Needs close supervision or verbal cueing    Standing Unsupported, Alternately Place Feet on Step/Stool Able to complete 4 steps without aid or supervision    Standing Unsupported, One Foot in Front Able to take small step independently and hold 30 seconds    Standing on One Leg Tries to lift leg/unable to hold 3 seconds but remains standing independently   4 sec left, only lift on right   Total Score 45      Timed Up and Go Test   TUG Normal TUG    Normal TUG (seconds) 18.52   19.22 and 17.82 sec on 2 trials                 PT Education - 10/20/20 1331    Education Details Discussed recert plan and results of testing.    Person(s) Educated Patient    Methods Explanation    Comprehension Verbalized  understanding            PT Short Term Goals - 09/23/20 0903      PT SHORT TERM GOAL #1   Title Pt will decrease TUG from 21 sec to <18 sec for improved balance and functional mobility.    Baseline 08/26/20 21 sec with RW, 16.9 seconds with RW, 17.4 with SPC with quad tip    Time 4    Period Weeks    Status  Achieved    Target Date 09/25/20      PT SHORT TERM GOAL #2   Title Pt will ambulate >800' on varied surfaces with RW/right hand grip attachment and AFO supervision for improved short community distances.    Baseline 600' CGA with walker - supervision/CGA at times when ambulating over pavement, was able to ambulate 900'    Time 4    Period Weeks    Status Partially Met    Target Date 09/25/20             PT Long Term Goals - 10/20/20 1004      PT LONG TERM GOAL #1   Title Pt and pt's family members will be independent with final HEP in order to build upon functional gains made in therapy. ALL LTGS DUE3/15/22    Baseline PT continues to update HEP as pt is progressing. Most recent update 08/26/20. Pt denies any questions on current HEP and reports he has been performing 10/20/20    Time 8    Period Weeks    Status Achieved      PT LONG TERM GOAL #2   Title Pt will ambulate 250' with cane and right AFO CGA for improved household mobility on level surfaces.    Baseline 345' with cane with 6 prong tip on 10/17/20 with CGA    Time 8    Period Weeks    Status Achieved      PT LONG TERM GOAL #3   Title Pt will increase Berg from 38 to >42/56 for improved balance and decreased fall risk.    Baseline 08/26/20 38/56. 10/20/20 45/56    Time 8    Period Weeks    Status Achieved      PT LONG TERM GOAL #4   Title Pt will ambulate up/down 2 steps with LRAD supervision for improved access in home to living room    Baseline when performing curb with cane needs min guard/min A at times for R foot clearance, 4 steps with cane min guard ascending, min A descending    Time 8    Period  Weeks    Status Not Met      PT LONG TERM GOAL #5   Title Pt will increase gait speed from 0.58ms to >0.752m for improved community mobility.    Baseline 08/26/20 0.5986m 14.9 seconds with RW = .67 m/s. 10/20/20 0.67m20mith cane    Time 8    Period Weeks    Status Partially Met          Updated PT goals:  PT Short Term Goals - 10/20/20 1340      PT SHORT TERM GOAL #1   Title Pt will decrease TUG with cane from 18.52 sec to < 15 sec for improved balance and functional mobility.    Baseline 10/20/20 18.52 sec    Time 4    Period Weeks    Status New    Target Date 11/19/20      PT SHORT TERM GOAL #2   Title Pt will ambulate with cane with quad tip >600' on level surfaces supervision for improved household and short community distances.    Time 4    Period Weeks    Status New    Target Date 11/19/20      PT SHORT TERM GOAL #3   Title Pt will increase gait speed from 0.67m/27mth cane to >0.56m/s44m improved mobility.    Baseline 10/20/20 0.67m/s 68m cane  Time 4    Period Weeks    Status New    Target Date 11/19/20           PT Long Term Goals - 10/20/20 1343      PT LONG TERM GOAL #1   Title Pt will ambulate >800' on varied surfaces with cane supervision for improved community mobility.    Time 8    Period Weeks    Status New    Target Date 12/19/20      PT LONG TERM GOAL #2   Title Pt will increase gait speed with cane to >0.11ms for improved community mobility.    Baseline 10/20/20 0.552m    Time 8    Period Weeks    Status New    Target Date 12/19/20      PT LONG TERM GOAL #3   Title Pt will increase Berg from 45 to >48/56 for improved balance and decreased fall risk.    Baseline 08/26/20 38/56. 10/20/20 45/56    Time 8    Period Weeks    Status Revised    Target Date 12/19/20      PT LONG TERM GOAL #4   Title Pt will ambulate up/down 2 steps with LRAD supervision for improved access in home to living room    Baseline when performing curb with  cane needs min guard/min A at times for R foot clearance, 4 steps with cane min guard ascending, min A descending    Time 8    Period Weeks    Status On-going    Target Date 12/19/20                 Plan - 10/20/20 1334    Clinical Impression Statement PT assessed remaining LTGs. Pt has shown good progress. [facility]. He is progressed gait speed with RW to 0.67109mindicating improved gait safety being limited community ambulator. Gait speed assessed with cane today was 0.25m24mTUG was performed today with cane and was 18.52 sec indicating he is still fall risk. Pt improved Berg Balance to 45/56 meeting goal and just on high fall risk border. Pt will benefit from continued PT to continue to progress gait with more focus on cane, strengthening and balance.    Personal Factors and Comorbidities Comorbidity 3+    Comorbidities dense L MCA CVA, paroxysmal A. fib on chronic Xarelto, nonischemic cardiomyopathy and hypertension.    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, works as an art Electrical engineerlving/Moderate complexity    Rehab Potential Good    PT Frequency 2x / week    PT Duration 8 weeks    PT Treatment/Interventions ADLs/Self Care Home Management;Aquatic Therapy;DME Instruction;Gait training;Stair training;Functional mobility training;Therapeutic activities;Therapeutic exercise;Electrical Stimulation;Balance training;Neuromuscular re-education;Wheelchair mobility training;Orthotic Fit/Training;Patient/family education;Passive range of motion;Energy conservation    PT Next Visit Plan continue R hip flexion strengthening. gait training with cane outdoors,  stair and curb training with cane.  RLE NMR with decr UE support, balance  on compliant surfaces.    PT Home Exercise Plan on 06/28/20 - added standing at [place] and stepping LLE out and in    Consulted and Agree with Plan of Care Patient;Family member/caregiver    Family Member Consulted [name]           Patient will benefit from skilled therapeutic intervention in order to improve  the following deficits and impairments:  Abnormal gait,Decreased activity tolerance,Decreased balance,Decreased cognition,Decreased mobility,Decreased coordination,Decreased range of motion,Decreased endurance,Decreased strength,Hypomobility,Difficulty walking,Impaired UE functional use,Impaired vision/preception,Postural dysfunction,Impaired sensation  Visit Diagnosis: Other abnormalities of gait and mobility  Muscle weakness (generalized)  Hemiplegia and hemiparesis following cerebral infarction affecting right dominant side ([place])  Unsteadiness on feet     Problem List Patient Active Problem List   Diagnosis Date Noted  . Benign essential HTN 06/09/2020  . Abnormality of gait 04/28/2020  . Neurogenic bladder 03/17/2020  . Sepsis due to pneumonia ([place]) 03/06/2020  . History of stroke with residual deficit 03/06/2020  . Transient hypotension 03/06/2020  . Spastic hemiplegia affecting nondominant side ([place])   . History of hypertension   . Expressive aphasia   . AKI (acute kidney injury) ([place])   . Global aphasia   . Hypoalbuminemia due to protein-calorie malnutrition ([place])   . Dysphagia, post-stroke   . Hyperlipidemia 02/02/2020  . Dysphagia following cerebral infarction 02/02/2020  . Aspiration pneumonia ([place]) 02/02/2020  . Acute ischemic left MCA stroke ([place]) 02/02/2020  . Acute respiratory failure ([place])   . Acute ischemic stroke [facility]) s/p clot retrieval L MCA & ACA A3 01/27/2020  . Aspiration into airway 01/27/2020  . Chronic anticoagulation 01/27/2020  . Paroxysmal atrial fibrillation ([place]) 06/15/2018  . Essential hypertension 06/15/2018  . NICM (nonischemic  cardiomyopathy) ([place]) 06/15/2018  . Visit for monitoring Tikosyn therapy 06/12/2018    Electa Sniff, PT, DPT, NCS 10/20/2020, 1:39 PM  [facility] [address] [place], [place], [address] Phone: [phone]   Fax:  [phone]  Name: Dustin Schneider MRN: [id number] Date of Birth: [birth year]

## 2020-10-20 NOTE — Therapy (Signed)
[facility] [address] [place], [place], [address] Phone: [phone]   Fax:  [phone]  Speech Language Pathology Treatment  Patient Details  Name: Dustin Schneider MRN: [id number] Date of Birth: 05-Apr-1956 Referring Provider (SLP): Dr. Delice Lesch   Encounter Date: 10/20/2020   End of Session - 10/20/20 1316    Visit Number 35    Number of Visits 40    Date for SLP Re-Evaluation 11/04/20    SLP Start Time 0962    SLP Stop Time  1103    SLP Time Calculation (min) 48 min    Activity Tolerance Patient tolerated treatment well           Past Medical History:  Diagnosis Date  . Hypertension   . NICM (nonischemic cardiomyopathy) ([place]) 06/15/2018   04/23/18: EF 45%, 09/15/18: NOrmal LVEF  . Paroxysmal atrial fibrillation ([place]) 06/15/2018  . Stroke ([place])   . Visit for monitoring Tikosyn therapy 06/12/2018    Past Surgical History:  Procedure Laterality Date  . BUBBLE STUDY  03/11/2020   Procedure: BUBBLE STUDY;  Surgeon: [name], MD;  Location: [place];  Service: Cardiovascular;;  . CARDIOVERSION N/A 05/13/2018   Procedure: CARDIOVERSION;  Surgeon: [name], MD;  Location: [facility] ENDOSCOPY;  Service: Cardiovascular;  Laterality: N/A;  . CARDIOVERSION N/A 06/13/2018   Procedure: CARDIOVERSION;  Surgeon: [name], MD;  Location: [facility] ENDOSCOPY;  Service: Cardiovascular;  Laterality: N/A;  . CARDIOVERSION N/A 06/23/2018   Procedure: CARDIOVERSION;  Surgeon: [name], MD;  Location: [facility] ENDOSCOPY;  Service: Cardiovascular;  Laterality: N/A;  . CARDIOVERSION N/A 03/11/2020   Procedure: CARDIOVERSION;  Surgeon: [name], MD;  Location: [place];  Service: Cardiovascular;  Laterality: N/A;  . excision of actinic keratosis    . EYE SURGERY     eye lift  . IR CT HEAD LTD  01/27/2020  . IR PERCUTANEOUS ART THROMBECTOMY/INFUSION INTRACRANIAL INC DIAG ANGIO  01/27/2020      . IR PERCUTANEOUS ART  THROMBECTOMY/INFUSION INTRACRANIAL INC DIAG ANGIO  01/27/2020  . RADIOLOGY WITH ANESTHESIA N/A 01/27/2020   Procedure: IR WITH ANESTHESIA;  Surgeon: Radiologist, Medication, MD;  Location: [place];  Service: Radiology;  Laterality: N/A;  . TEE WITHOUT CARDIOVERSION N/A 03/11/2020   Procedure: TRANSESOPHAGEAL ECHOCARDIOGRAM (TEE);  Surgeon: [name], MD;  Location: [place];  Service: Cardiovascular;  Laterality: N/A;  . TONSILLECTOMY    . WISDOM TOOTH EXTRACTION      There were no vitals filed for this visit.   Subjective Assessment - 10/20/20 1312    Subjective "good"    Patient is accompained by: Family member   Jodie   Currently in Pain? 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 Pt utilized written communication x5 with rare min A, which was effective. Pt also used gestures and drawing x1 to aid commuication with success. Self-correction of phonemic errors with repetition and slow rate exhibited this session. Pt is hosting a dinner party tonight, in  which aide cued him to provide toast and think of questions to ask guests. SLP provided written prompts for practice beforehand.      Assessment / Recommendations / Plan   Plan Continue with current plan of care      Progression Toward Goals   Progression toward goals Progressing toward goals            SLP Education - 10/20/20 1315    Education Details script for dinner party, call [facility], emergency phrases    Person(s) Educated  Patient;Caregiver(s)    Methods Explanation;Demonstration;Handout    Comprehension Verbalized understanding;Returned demonstration;Need further instruction            SLP Short Term Goals - 10/20/20 1005      SLP SHORT TERM GOAL #1   Title Pt will name family, friends and art items with occasional min A 8/10 with approximations allowed.    Status Partially Met      SLP SHORT TERM GOAL #2   Title Pt will name 7 items in personally relevant categories with occasional min A over 2 sessions    Status Partially Met      SLP SHORT TERM GOAL #3   Title Pt will perform 4 automatic speech tasks with rare min A.    Status Partially Met      SLP SHORT TERM GOAL #4   Title Pt will write personal information (name/address/phone), 5 family/friend/pet names and 7  professional words with occasional min A    Status Not Met      SLP SHORT TERM GOAL #5   Title Pt will use mulitmodal communication (gesture, draw, write 1st letter etc) to augment verbal expression to meet needs at home with rare min A from family.    Status Partially Met            SLP Long Term Goals - 10/20/20 1005      SLP LONG TERM GOAL #1   Title Pt will verbalize 3 words to explain or describe his artwork and hobbies with occasional min A over 3 sessions, allowing for intellgible approximations    Baseline 05-17-20 (Lingraphica cues), 05-24-20 (lingraphica), 05-31-20 (lingraphica); 07/25/20 (written word)    Status Achieved      SLP LONG TERM GOAL #2   Title Pt and family will utilize multimodal compensations for aphasia and verbal apraxia to participate in 3 turns each of conversation with occasional min A over 2 sessions    Baseline 07/25/20;    Status Achieved      SLP LONG TERM GOAL #3   Title Pt will write 3 word phrases with less than 2 errors with rare min A over 3 sessions    Status Not Met      SLP LONG TERM GOAL #4   Title Reading assessment if indicated    Status Deferred      SLP LONG TERM GOAL #5    Title [name] will use multimodal communication spontaneously  (writing, gesture, device) to augment verbal expression as needed 3/5 times with rare min A    Baseline 10-10-20; 10/17/20, 10-20-20    Time 1   Goal renewed 09-23-20   Period Weeks    Status On-going      SLP LONG TERM GOAL #6   Title [name] will write (approximate) 4 items in personally relevant category that is understood by family/friends    Baseline 08/22/20; 08/31/20; 09/06/19    Status Achieved      SLP LONG TERM  GOAL #7   Title Pt will employ multimodal communication to participate in 6 turns in conversation with extended time and occasional min A    Baseline 10/17/20;    Time --   Goals renewed 09-23-20   Status Achieved            Plan - 10/20/20 1316    Clinical Impression Statement [name] exhibits improving moderate Broca's aphasia and moderate verbal apraxia impacting communication re: details of his artwork,and in moderately complex conversation, with steady improvements noted during ST sessions. [name] was able to verbally explain about his recent interview with use of multimodal communication and occasional A from caregiver. Pt noted with improvements in MLU for verbal output and decreased frequency of phonemic errors. [name] has recently become more independent at home, including staying without overnight caregiver and getting ready in morning by himself. SLP targeted use of functional words/phrases to use at dinner party with neighbors tonight, in which pt able to verbalize with min A.  Pt would continue to benefit from additional ST visits to increase QOL and effective communication    Speech Therapy Frequency 2x / week    Duration Other (comment)   40 visits   Treatment/Interventions Compensatory strategies;Cueing hierarchy;Functional tasks;Patient/family education;Multimodal communcation approach;SLP instruction and feedback    Potential to Achieve Goals Good    Potential Considerations Severity of impairments    SLP Home  Exercise Plan provided    Consulted and Agree with Plan of Care Patient;Family member/caregiver    Family Member Consulted Jodie           Patient will benefit from skilled therapeutic intervention in order to improve the following deficits and impairments:   Verbal apraxia  Aphasia    Problem List Patient Active Problem List   Diagnosis Date Noted  . Benign essential HTN 06/09/2020  . Abnormality of gait 04/28/2020  . Neurogenic bladder 03/17/2020  . Sepsis due to pneumonia ([place]) 03/06/2020  . History of stroke with residual deficit 03/06/2020  . Transient hypotension 03/06/2020  . Spastic hemiplegia affecting nondominant side ([place])   . History of hypertension   . Expressive aphasia   . AKI (acute kidney injury) ([place])   . Global aphasia   . Hypoalbuminemia due to protein-calorie malnutrition ([place])   . Dysphagia, post-stroke   . Hyperlipidemia 02/02/2020  . Dysphagia following cerebral infarction 02/02/2020  . Aspiration pneumonia ([place]) 02/02/2020  . Acute ischemic left MCA stroke ([place]) 02/02/2020  . Acute respiratory failure ([place])   . Acute ischemic stroke [facility]) s/p clot retrieval L MCA & ACA A3 01/27/2020  . Aspiration into airway 01/27/2020  . Chronic anticoagulation 01/27/2020  . Paroxysmal atrial fibrillation ([place]) 06/15/2018  . Essential hypertension 06/15/2018  . NICM (nonischemic cardiomyopathy) ([place]) 06/15/2018  . Visit for monitoring Tikosyn therapy 06/12/2018    Alinda Deem, MA CCC-SLP 10/20/2020, 1:18 PM  [facility] [address] [place], [place], [address] Phone: [phone]   Fax:  [phone]   Name: DIO GILLER MRN: [id number] Date of Birth: Aug 10, 1956

## 2020-10-20 NOTE — Patient Instructions (Addendum)
  Designer, industrial/product:   Toast: Here's to great neighbors!  Ann: What did you do for work? What possessed you to work in my yard?   Rosann Auerbach: How is Lurena Joiner doing?

## 2020-10-24 ENCOUNTER — Ambulatory Visit: Payer: BC Managed Care – PPO

## 2020-10-24 ENCOUNTER — Encounter: Payer: Self-pay | Admitting: Physical Therapy

## 2020-10-24 ENCOUNTER — Ambulatory Visit: Payer: BC Managed Care – PPO | Admitting: Physical Therapy

## 2020-10-24 ENCOUNTER — Other Ambulatory Visit: Payer: Self-pay

## 2020-10-24 DIAGNOSIS — M6281 Muscle weakness (generalized): Secondary | ICD-10-CM

## 2020-10-24 DIAGNOSIS — R2681 Unsteadiness on feet: Secondary | ICD-10-CM

## 2020-10-24 DIAGNOSIS — R482 Apraxia: Secondary | ICD-10-CM

## 2020-10-24 DIAGNOSIS — R29818 Other symptoms and signs involving the nervous system: Secondary | ICD-10-CM

## 2020-10-24 DIAGNOSIS — R4701 Aphasia: Secondary | ICD-10-CM

## 2020-10-24 DIAGNOSIS — I69351 Hemiplegia and hemiparesis following cerebral infarction affecting right dominant side: Secondary | ICD-10-CM

## 2020-10-24 DIAGNOSIS — R2689 Other abnormalities of gait and mobility: Secondary | ICD-10-CM

## 2020-10-24 NOTE — Therapy (Signed)
[facility] [address] [place], [place], [address] Phone: [phone]   Fax:  [phone]  Physical Therapy Treatment  Patient Details  Name: Dustin Schneider MRN: [id number] Date of Birth: Sep 08, 1955 Referring Provider (PT): Lauraine Rinne, PA-C but followed by Delice Lesch   Encounter Date: 10/24/2020   PT End of Session - 10/24/20 1053    Visit Number 92    Number of Visits 74    Authorization Type BCBS    PT Start Time 0932    PT Stop Time 1014    PT Time Calculation (min) 42 min    Equipment Utilized During Treatment Gait belt    Activity Tolerance Patient tolerated treatment well    Behavior During Therapy WFL for tasks assessed/performed           Past Medical History:  Diagnosis Date  . Hypertension   . NICM (nonischemic cardiomyopathy) ([place]) 06/15/2018   04/23/18: EF 45%, 09/15/18: NOrmal LVEF  . Paroxysmal atrial fibrillation ([place]) 06/15/2018  . Stroke ([place])   . Visit for monitoring Tikosyn therapy 06/12/2018    Past Surgical History:  Procedure Laterality Date  . BUBBLE Schneider  03/11/2020   Procedure: BUBBLE Schneider;  Surgeon: [name], MD;  Location: [place];  Service: Cardiovascular;;  . CARDIOVERSION N/A 05/13/2018   Procedure: CARDIOVERSION;  Surgeon: [name], MD;  Location: [facility] ENDOSCOPY;  Service: Cardiovascular;  Laterality: N/A;  . CARDIOVERSION N/A 06/13/2018   Procedure: CARDIOVERSION;  Surgeon: [name], MD;  Location: [facility] ENDOSCOPY;  Service: Cardiovascular;  Laterality: N/A;  . CARDIOVERSION N/A 06/23/2018   Procedure: CARDIOVERSION;  Surgeon: [name], MD;  Location: [facility] ENDOSCOPY;  Service: Cardiovascular;  Laterality: N/A;  . CARDIOVERSION N/A 03/11/2020   Procedure: CARDIOVERSION;  Surgeon: [name], MD;  Location: [place];  Service: Cardiovascular;  Laterality: N/A;  . excision of actinic keratosis    . EYE SURGERY     eye lift  . IR CT HEAD LTD  01/27/2020  .  IR PERCUTANEOUS ART THROMBECTOMY/INFUSION INTRACRANIAL INC DIAG ANGIO  01/27/2020      . IR PERCUTANEOUS ART THROMBECTOMY/INFUSION INTRACRANIAL INC DIAG ANGIO  01/27/2020  . RADIOLOGY WITH ANESTHESIA N/A 01/27/2020   Procedure: IR WITH ANESTHESIA;  Surgeon: Radiologist, Medication, MD;  Location: [place];  Service: Radiology;  Laterality: N/A;  . TEE WITHOUT CARDIOVERSION N/A 03/11/2020   Procedure: TRANSESOPHAGEAL ECHOCARDIOGRAM (TEE);  Surgeon: [name], MD;  Location: [place];  Service: Cardiovascular;  Laterality: N/A;  . TONSILLECTOMY    . WISDOM TOOTH EXTRACTION      There were no vitals filed for this visit.   Subjective Assessment - 10/24/20 0935    Subjective Had a good weekend.    Pertinent History PMH: HTN, paroxysmal a fib    Limitations Standing;Walking    Patient Stated Goals "wants everything working on R side again"    Currently in Pain? No/denies                             [facility] Adult PT Treatment/Exercise - 10/24/20 0001      Ambulation/Gait   Ambulation/Gait Yes    Ambulation/Gait Assistance 5: Supervision;4: Min guard    Ambulation/Gait Assistance Details around in clinic, plus 2 laps scanning environment with min guard for balance    Ambulation Distance (Feet) 230 Feet    Assistive device Straight cane   with 6 prong tip   Gait  Pattern Step-through pattern;Decreased stance time - right;Decreased step length - left;Decreased hip/knee flexion - right    Ambulation Surface Level;Indoor    Stairs Yes    Stairs Assistance 4: Min guard;4: Min assist    Stairs Assistance Details (indicate cue type and reason) during last set of 4 steps, pt needing min A intermittently for descending for safety.    Stair Management Technique Step to pattern;With cane    Number of Stairs 12    Height of Stairs 6      Neuro Re-ed    Neuro Re-ed Details  gait over obstacle course: down and back x2 reps (approx. 40') - ambulating over red compliant mat with cues  for safety with cane when stepping on (stepping on with RLE first), and stepping over 4 obstacles of 1-2" in height, with pt stepping over first with RLE for additional hip flexor strengthening. min guard for safety               Balance Exercises - 10/24/20 0001      Balance Exercises: Standing   Standing Eyes Closed Wide (BOA);Foam/compliant surface    Standing Eyes Closed Limitations on blue foam beam: 4 x 10 second reps with min A for safety, cues for weight shifitng in midline as pt with tendency to have weight posteriorly on heels    Sit to Stand Foam/compliant surface;Without upper extremity support   x10 reps on blue foam beam         Access Code: [email or web address] URL: [email or web address] Date: 10/24/2020 Prepared by: [name]  Reviewed bolded exercises below for pt's HEP, pt reports not consistently performing the marching/clamshell at home.   Exercises Supine Bridge - 1 x daily - 5 x weekly - 2 sets - 10 reps Supine Hip Internal and External Rotation - 2 x daily - 7 x weekly - 2 sets - 10 reps Clamshell - 1 x daily - 7 x weekly - 2 sets - 10 reps - pt's gf [name] video taping proper technique, needs manual cues initially for proper technique and to prevent hips from rolling back.  Seated Hip Adduction Isometrics with Ball - 3 x daily - 7 x weekly - 2 sets - 10 reps - 5 hold Side to Side Weight Shift with Counter Support - 2 x daily - 7 x weekly - 2 sets - 10 reps Mini Squat with Counter Support - 2 x daily - 7 x weekly - 2 sets - 10 reps Supine March - 2 x daily - 7 x weekly - 2 sets - 10 reps - cues for slowed descent back down to mat table, educated on importance of performing at home. Performed an additional 10 reps with therapist providing manual resistance into flexion and when pt lowering.      PT Education - 10/24/20 1052    Education Details reviewed certain exercises on pt's HEP    Person(s) Educated Patient   pt's gf   Methods  Demonstration;Explanation    Comprehension Verbalized understanding;Returned demonstration            PT Short Term Goals - 10/20/20 1340      PT SHORT TERM GOAL #1   Title Pt will decrease TUG with cane from 18.52 sec to < 15 sec for improved balance and functional mobility.    Baseline 10/20/20 18.52 sec    Time 4    Period Weeks    Status New    Target Date 11/19/20  PT SHORT TERM GOAL #2   Title Pt will ambulate with cane with quad tip >600' on level surfaces supervision for improved household and short community distances.    Time 4    Period Weeks    Status New    Target Date 11/19/20      PT SHORT TERM GOAL #3   Title Pt will increase gait speed from 0.40m/s with cane to >0.39m/s for improved mobility.    Baseline 10/20/20 0.65m/s with cane    Time 4    Period Weeks    Status New    Target Date 11/19/20             PT Long Term Goals - 10/20/20 1343      PT LONG TERM GOAL #1   Title Pt will ambulate >800' on varied surfaces with cane supervision for improved community mobility.    Time 8    Period Weeks    Status New    Target Date 12/19/20      PT LONG TERM GOAL #2   Title Pt will increase gait speed with cane to >0.62m/s for improved community mobility.    Baseline 10/20/20 0.11m/s    Time 8    Period Weeks    Status New    Target Date 12/19/20      PT LONG TERM GOAL #3   Title Pt will increase Berg from 45 to >48/56 for improved balance and decreased fall risk.    Baseline 08/26/20 38/56. 10/20/20 45/56    Time 8    Period Weeks    Status Revised    Target Date 12/19/20      PT LONG TERM GOAL #4   Title Pt will ambulate up/down 2 steps with LRAD supervision for improved access in home to living room    Baseline when performing curb with cane needs min guard/min A at times for R foot clearance, 4 steps with cane min guard ascending, min A descending    Time 8    Period Weeks    Status On-going    Target Date 12/19/20                  Plan - 10/24/20 1100    Clinical Impression Statement Today's skilled session continued to focus on gait training with scanning environment and obstacle negotiation over unlevel surfaces/over 2" obstacles with pt needing min guard for balance. Reviewed some exercises from pt's previous HEP - needing review of clamshell and supine hip flexion as pt had not been consistently performing at home. Will continue to progress towards LTGs.    Personal Factors and Comorbidities Comorbidity 3+    Comorbidities dense L MCA CVA, paroxysmal A. fib on chronic Xarelto, nonischemic cardiomyopathy and hypertension.    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, works as an Electrical engineer Evolving/Moderate complexity    Rehab Potential Good    PT Frequency 2x / week    PT Duration 8 weeks    PT Treatment/Interventions ADLs/Self Care Home Management;Aquatic Therapy;DME Instruction;Gait training;Stair training;Functional mobility training;Therapeutic activities;Therapeutic exercise;Electrical Stimulation;Balance training;Neuromuscular re-education;Wheelchair mobility training;Orthotic Fit/Training;Patient/family education;Passive range of motion;Energy conservation    PT Next Visit Plan continue R hip flexion strengthening. gait training with cane outdoors,  stair and curb training with cane.  RLE NMR with decr UE support, balance on compliant surfaces.    PT Home Exercise Plan on 06/28/20 -  added standing at [place] and stepping LLE out and in    Consulted and Agree with Plan of Care Patient;Family member/caregiver    Family Member Consulted [name]           Patient will benefit from skilled therapeutic intervention in order to improve the following deficits and impairments:   Abnormal gait,Decreased activity tolerance,Decreased balance,Decreased cognition,Decreased mobility,Decreased coordination,Decreased range of motion,Decreased endurance,Decreased strength,Hypomobility,Difficulty walking,Impaired UE functional use,Impaired vision/preception,Postural dysfunction,Impaired sensation  Visit Diagnosis: Other abnormalities of gait and mobility  Muscle weakness (generalized)  Hemiplegia and hemiparesis following cerebral infarction affecting right dominant side ([place])  Unsteadiness on feet  Other symptoms and signs involving the nervous system     Problem List Patient Active Problem List   Diagnosis Date Noted  . Benign essential HTN 06/09/2020  . Abnormality of gait 04/28/2020  . Neurogenic bladder 03/17/2020  . Sepsis due to pneumonia ([place]) 03/06/2020  . History of stroke with residual deficit 03/06/2020  . Transient hypotension 03/06/2020  . Spastic hemiplegia affecting nondominant side ([place])   . History of hypertension   . Expressive aphasia   . AKI (acute kidney injury) ([place])   . Global aphasia   . Hypoalbuminemia due to protein-calorie malnutrition ([place])   . Dysphagia, post-stroke   . Hyperlipidemia 02/02/2020  . Dysphagia following cerebral infarction 02/02/2020  . Aspiration pneumonia ([place]) 02/02/2020  . Acute ischemic left MCA stroke ([place]) 02/02/2020  . Acute respiratory failure ([place])   . Acute ischemic stroke [facility]) s/p clot retrieval L MCA & ACA A3 01/27/2020  . Aspiration into airway 01/27/2020  . Chronic anticoagulation 01/27/2020  . Paroxysmal atrial fibrillation ([place]) 06/15/2018  . Essential hypertension 06/15/2018  . NICM (nonischemic cardiomyopathy) ([place]) 06/15/2018  . Visit for monitoring Tikosyn therapy 06/12/2018    Arliss Journey, PT, DPT  10/24/2020, 11:04 AM  [facility] [address] [place], [place], [address] Phone: [phone]   Fax:   [phone]  Name: Dustin Schneider MRN: [id number] Date of Birth: [birth year]

## 2020-10-24 NOTE — Therapy (Signed)
[facility] [address] [place], [place], [address] Phone: [phone]   Fax:  [phone]  Speech Language Pathology Treatment  Patient Details  Name: Dustin Schneider MRN: [id number] Date of Birth: 06/27/1956 Referring Provider (SLP): Dr. Delice Lesch   Encounter Date: 10/24/2020   End of Session - 10/24/20 1429    Visit Number 36    Number of Visits 40    Date for SLP Re-Evaluation 11/04/20    SLP Start Time 1100    SLP Stop Time  1144    SLP Time Calculation (min) 44 min    Activity Tolerance Patient tolerated treatment well           Past Medical History:  Diagnosis Date  . Hypertension   . NICM (nonischemic cardiomyopathy) ([place]) 06/15/2018   04/23/18: EF 45%, 09/15/18: NOrmal LVEF  . Paroxysmal atrial fibrillation ([place]) 06/15/2018  . Stroke ([place])   . Visit for monitoring Tikosyn therapy 06/12/2018    Past Surgical History:  Procedure Laterality Date  . BUBBLE STUDY  03/11/2020   Procedure: BUBBLE STUDY;  Surgeon: [name], MD;  Location: [place];  Service: Cardiovascular;;  . CARDIOVERSION N/A 05/13/2018   Procedure: CARDIOVERSION;  Surgeon: [name], MD;  Location: [facility] ENDOSCOPY;  Service: Cardiovascular;  Laterality: N/A;  . CARDIOVERSION N/A 06/13/2018   Procedure: CARDIOVERSION;  Surgeon: [name], MD;  Location: [facility] ENDOSCOPY;  Service: Cardiovascular;  Laterality: N/A;  . CARDIOVERSION N/A 06/23/2018   Procedure: CARDIOVERSION;  Surgeon: [name], MD;  Location: [facility] ENDOSCOPY;  Service: Cardiovascular;  Laterality: N/A;  . CARDIOVERSION N/A 03/11/2020   Procedure: CARDIOVERSION;  Surgeon: [name], MD;  Location: [place];  Service: Cardiovascular;  Laterality: N/A;  . excision of actinic keratosis    . EYE SURGERY     eye lift  . IR CT HEAD LTD  01/27/2020  . IR PERCUTANEOUS ART THROMBECTOMY/INFUSION INTRACRANIAL INC DIAG ANGIO  01/27/2020      . IR PERCUTANEOUS ART  THROMBECTOMY/INFUSION INTRACRANIAL INC DIAG ANGIO  01/27/2020  . RADIOLOGY WITH ANESTHESIA N/A 01/27/2020   Procedure: IR WITH ANESTHESIA;  Surgeon: Radiologist, Medication, MD;  Location: [place];  Service: Radiology;  Laterality: N/A;  . TEE WITHOUT CARDIOVERSION N/A 03/11/2020   Procedure: TRANSESOPHAGEAL ECHOCARDIOGRAM (TEE);  Surgeon: [name], MD;  Location: [place];  Service: Cardiovascular;  Laterality: N/A;  . TONSILLECTOMY    . WISDOM TOOTH EXTRACTION      There were no vitals filed for this visit.   Subjective Assessment - 10/24/20 1103    Subjective "cough"    Currently in Pain? No/denies    Pain Score 0-No pain                 ADULT SLP TREATMENT - 10/24/20 0001      General Information   Behavior/Cognition Cooperative;Pleasant mood;Requires cueing      Treatment Provided   Treatment provided Cognitive-Linquistic      Cognitive-Linquistic Treatment   Treatment focused on Aphasia;Apraxia;Patient/family/caregiver education    Skilled Treatment Pt reported "cough" and "tired" upon SLP entrance, with pt demonstrating increased difficulty with verbal communication. As session progressed, pt demonstrated increased fluidity and improved word finding. SLP prompted use of written communication, which was effective for 50% of opportunities. Good use of gestures and rhyming words exhibited. 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      Assessment / Recommendations /  Plan   Plan Continue with current plan of care      Progression Toward Goals   Progression toward goals Progressing toward goals            SLP Education - 10/24/20 1429    Education Details recommendations for return to teaching, multimodal comm    Person(s) Educated Patient;Caregiver(s)    Methods Explanation;Demonstration    Comprehension Verbalized understanding;Returned demonstration            SLP Short  Term Goals - 10/24/20 1432      SLP SHORT TERM GOAL #1   Title Pt will name family, friends and art items with occasional min A 8/10 with approximations allowed.    Status Partially Met      SLP SHORT TERM GOAL #2   Title Pt will name 7 items in personally relevant categories with occasional min A over 2 sessions    Status Partially Met      SLP SHORT TERM GOAL #3   Title Pt will perform 4 automatic speech tasks with rare min A.    Status Partially Met      SLP SHORT TERM GOAL #4   Title Pt will write personal information (name/address/phone), 5 family/friend/pet names and 7  professional words with occasional min A    Status Not Met      SLP SHORT TERM GOAL #5   Title Pt will use mulitmodal communication (gesture, draw, write 1st letter etc) to augment verbal expression to meet needs at home with rare min A from family.    Status Partially Met            SLP Long Term Goals - 10/24/20 1432      SLP LONG TERM GOAL #1   Title Pt will verbalize 3 words to explain or describe his artwork and hobbies with occasional min A over 3 sessions, allowing for intellgible approximations    Baseline 05-17-20 (Lingraphica cues), 05-24-20 (lingraphica), 05-31-20 (lingraphica); 07/25/20 (written word)    Status Achieved      SLP LONG TERM GOAL #2   Title Pt and family will utilize multimodal compensations for aphasia and verbal apraxia to participate in 3 turns each of conversation with occasional min A over 2 sessions    Baseline 07/25/20;    Status Achieved      SLP LONG TERM GOAL #3   Title Pt will write 3 word phrases with less than 2 errors with rare min A over 3 sessions    Status Not Met      SLP LONG TERM GOAL #4   Title Reading assessment if indicated    Status Deferred      SLP LONG TERM GOAL #5   Title [name] will use multimodal communication spontaneously  (writing, gesture, device) to augment verbal expression as needed 3/5 times with rare min A    Baseline 10-10-20; 10/17/20,  10-20-20    Time --   Goal renewed 09-23-20   Status Partially Met      SLP LONG TERM GOAL #6   Title [name] will write (approximate) 4 items in personally relevant category that is understood by family/friends    Baseline 08/22/20; 08/31/20; 09/06/19    Status Achieved      SLP LONG TERM GOAL #7   Title Pt will employ multimodal communication to participate in 6 turns in conversation with extended time and occasional min A    Baseline 10/17/20;    Time --   Goals renewed  09-23-20   Status Achieved            Plan - 10/24/20 1429    Clinical Impression Statement [name] exhibits improving moderate Broca's aphasia and moderate verbal apraxia. Due to fatigue and decline in health, pt demonstrated increased difficulty with verbal communication initially. Improvements in word finding and increased frequency of written communication was noted as session progressed. 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 Pt would continue to benefit from additional ST visits to increase QOL and effective communication.    Speech Therapy Frequency 2x / week    Duration Other (comment)   40 visits   Treatment/Interventions Compensatory strategies;Cueing hierarchy;Functional tasks;Patient/family education;Multimodal communcation approach;SLP instruction and feedback    Potential to Achieve Goals Good    SLP Home Exercise Plan provided    Consulted and Agree with Plan of Care Patient;Family member/caregiver    Family Member Consulted [name]           Patient will benefit from skilled therapeutic intervention in order to improve the following deficits and impairments:   Verbal apraxia  Aphasia    Problem List Patient Active Problem List   Diagnosis Date Noted  . Benign essential HTN 06/09/2020  . Abnormality of gait 04/28/2020  . Neurogenic bladder 03/17/2020  . Sepsis due to pneumonia ([place])  03/06/2020  . History of stroke with residual deficit 03/06/2020  . Transient hypotension 03/06/2020  . Spastic hemiplegia affecting nondominant side ([place])   . History of hypertension   . Expressive aphasia   . AKI (acute kidney injury) ([place])   . Global aphasia   . Hypoalbuminemia due to protein-calorie malnutrition ([place])   . Dysphagia, post-stroke   . Hyperlipidemia 02/02/2020  . Dysphagia following cerebral infarction 02/02/2020  . Aspiration pneumonia ([place]) 02/02/2020  . Acute ischemic left MCA stroke ([place]) 02/02/2020  . Acute respiratory failure ([place])   . Acute ischemic stroke [facility]) s/p clot retrieval L MCA & ACA A3 01/27/2020  . Aspiration into airway 01/27/2020  . Chronic anticoagulation 01/27/2020  . Paroxysmal atrial fibrillation ([place]) 06/15/2018  . Essential hypertension 06/15/2018  . NICM (nonischemic cardiomyopathy) ([place]) 06/15/2018  . Visit for monitoring Tikosyn therapy 06/12/2018    Alinda Deem, MA CCC-SLP 10/24/2020, 2:33 PM  [facility] [address] [place], [place], [address] Phone: [phone]   Fax:  [phone]   Name: JAHAAN VANWAGNER MRN: [id number] Date of Birth: 1955/10/02

## 2020-10-25 ENCOUNTER — Telehealth (INDEPENDENT_AMBULATORY_CARE_PROVIDER_SITE_OTHER): Payer: BC Managed Care – PPO | Admitting: Family

## 2020-10-25 DIAGNOSIS — U071 COVID-19: Secondary | ICD-10-CM

## 2020-10-25 NOTE — Progress Notes (Signed)
Dustin Schneider is a 65 y.o. male with the following history as recorded in EpicCare:  Patient Active Problem List   Diagnosis Date Noted  . Benign essential HTN 06/09/2020  . Abnormality of gait 04/28/2020  . Neurogenic bladder 03/17/2020  . Sepsis due to pneumonia ([place]) 03/06/2020  . History of stroke with residual deficit 03/06/2020  . Transient hypotension 03/06/2020  . Spastic hemiplegia affecting nondominant side ([place])   . History of hypertension   . Expressive aphasia   . AKI (acute kidney injury) ([place])   . Global aphasia   . Hypoalbuminemia due to protein-calorie malnutrition ([place])   . Dysphagia, post-stroke   . Hyperlipidemia 02/02/2020  . Dysphagia following cerebral infarction 02/02/2020  . Aspiration pneumonia ([place]) 02/02/2020  . Acute ischemic left MCA stroke ([place]) 02/02/2020  . Acute respiratory failure ([place])   . Acute ischemic stroke [facility]) s/p clot retrieval L MCA & ACA A3 01/27/2020  . Aspiration into airway 01/27/2020  . Chronic anticoagulation 01/27/2020  . Paroxysmal atrial fibrillation ([place]) 06/15/2018  . Essential hypertension 06/15/2018  . NICM (nonischemic cardiomyopathy) ([place]) 06/15/2018  . Visit for monitoring Tikosyn therapy 06/12/2018    Current Outpatient Medications  Medication Sig Dispense Refill  . apixaban (ELIQUIS) 5 MG TABS tablet Take 1 tablet (5 mg total) by mouth 2 (two) times daily. 60 tablet 6  . baclofen (LIORESAL) 20 MG tablet TAKE 1 TABLET BY MOUTH 3 TIMES A DAY. 90 tablet 3  . diltiazem (CARDIZEM CD) 180 MG 24 hr capsule TAKE (1) CAPSULE DAILY. 30 capsule 6  . dofetilide (TIKOSYN) 500 MCG capsule Take 1 capsule (500 mcg total) by mouth 2 (two) times daily. 180 capsule 2  . metoprolol tartrate (LOPRESSOR) 50 MG tablet Take 1 tablet (50 mg total) by mouth 2 (two) times daily. 180 tablet 2  . rosuvastatin (CRESTOR) 10 MG tablet TAKE 1 TABLET BY MOUTH DAILY AFTER SUPPER 30 tablet 6   No current facility-administered medications for this  visit.    Allergies: Patient has no known allergies.  Past Medical History:  Diagnosis Date  . Hypertension   . NICM (nonischemic cardiomyopathy) ([place]) 06/15/2018   04/23/18: EF 45%, 09/15/18: NOrmal LVEF  . Paroxysmal atrial fibrillation ([place]) 06/15/2018  . Stroke ([place])   . Visit for monitoring Tikosyn therapy 06/12/2018    Past Surgical History:  Procedure Laterality Date  . BUBBLE STUDY  03/11/2020   Procedure: BUBBLE STUDY;  Surgeon: [name], MD;  Location: [place];  Service: Cardiovascular;;  . CARDIOVERSION N/A 05/13/2018   Procedure: CARDIOVERSION;  Surgeon: [name], MD;  Location: [facility] ENDOSCOPY;  Service: Cardiovascular;  Laterality: N/A;  . CARDIOVERSION N/A 06/13/2018   Procedure: CARDIOVERSION;  Surgeon: [name], MD;  Location: [facility] ENDOSCOPY;  Service: Cardiovascular;  Laterality: N/A;  . CARDIOVERSION N/A 06/23/2018   Procedure: CARDIOVERSION;  Surgeon: [name], MD;  Location: [facility] ENDOSCOPY;  Service: Cardiovascular;  Laterality: N/A;  . CARDIOVERSION N/A 03/11/2020   Procedure: CARDIOVERSION;  Surgeon: [name], MD;  Location: [place];  Service: Cardiovascular;  Laterality: N/A;  . excision of actinic keratosis    . EYE SURGERY     eye lift  . IR CT HEAD LTD  01/27/2020  . IR PERCUTANEOUS ART THROMBECTOMY/INFUSION INTRACRANIAL INC DIAG ANGIO  01/27/2020      . IR PERCUTANEOUS ART THROMBECTOMY/INFUSION INTRACRANIAL INC DIAG ANGIO  01/27/2020  . RADIOLOGY WITH ANESTHESIA N/A 01/27/2020   Procedure: IR WITH ANESTHESIA;  Surgeon: Radiologist, Medication, MD;  Location:  [place] [facility];  Service: Radiology;  Laterality: N/A;  . TEE WITHOUT CARDIOVERSION N/A 03/11/2020   Procedure: TRANSESOPHAGEAL ECHOCARDIOGRAM (TEE);  Surgeon: [name], MD;  Location: [place];  Service: Cardiovascular;  Laterality: N/A;  . TONSILLECTOMY    . WISDOM TOOTH EXTRACTION      Family History  Problem Relation Age of Onset  . Glaucoma Mother   . Atrial fibrillation Father      Social History   Tobacco Use  . Smoking status: Never Smoker  . Smokeless tobacco: Never Used  Substance Use Topics  . Alcohol use: Not Currently    Subjective:   I connected with Dustin Schneider on 10/25/20 at  9:20 AM EDT by a telephone call  and verified that I am speaking with the correct person using two identifiers. Patient has aphasia and cannot participate in a phone call; the family was not able to get the [place] platform to work.    I discussed the limitations of evaluation and management by telemedicine and the availability of in person appointments. The patient expressed understanding and agreed to proceed. Provider in office/ patient is at home; provider, patient's girlfriend and patient are only 3 people on telephone call.   Patient's girlfriend provides information-  Patient has aphasia; patient has tested + for COVID this morning; symptoms started yesterday- cough/ congestion/ low grade fever; no difficulty breathing; per family, O2 sats are 94%;    Objective:  There were no vitals filed for this visit.  Lungs: Respirations unlabored;   Assessment:  1. COVID-19     Plan:  Will refer for monoclonal antibody treatment; family is aware that our office does not manage or schedule this- they will be contacted by [facility]; symptomatic treatment discussed; follow-up to be determined;  Time spent 12 minutes  No follow-ups on file.  Orders Placed This Encounter  Procedures  . Ambulatory referral for Covid Treatment    Referral Priority:   Routine    Referral Type:   Auth/Cert    Referral Reason:   Specialty Services Required    Number of Visits Requested:   1    Requested Prescriptions    No prescriptions requested or ordered in this encounter

## 2020-10-26 ENCOUNTER — Other Ambulatory Visit: Payer: Self-pay | Admitting: Adult Health

## 2020-10-26 ENCOUNTER — Encounter: Payer: Self-pay | Admitting: Adult Health

## 2020-10-26 ENCOUNTER — Ambulatory Visit (HOSPITAL_COMMUNITY)
Admission: RE | Admit: 2020-10-26 | Discharge: 2020-10-26 | Disposition: A | Discharge status: Home / Self Care | Payer: BC Managed Care – PPO | Source: Ambulatory Visit | Attending: Pulmonary Disease | Admitting: Pulmonary Disease

## 2020-10-26 ENCOUNTER — Telehealth: Payer: Self-pay

## 2020-10-26 DIAGNOSIS — U071 COVID-19: Secondary | ICD-10-CM

## 2020-10-26 MED ORDER — SODIUM CHLORIDE 0.9 % IV SOLN: INTRAVENOUS | Status: DC | PRN

## 2020-10-26 MED ORDER — DIPHENHYDRAMINE HCL 50 MG/ML IJ SOLN: 50.0000 mg | Freq: Once | INTRAMUSCULAR | Status: DC | PRN

## 2020-10-26 MED ORDER — FAMOTIDINE IN NACL 20-0.9 MG/50ML-% IV SOLN: 20.0000 mg | Freq: Once | INTRAVENOUS | Status: DC | PRN

## 2020-10-26 MED ORDER — ALBUTEROL SULFATE HFA 108 (90 BASE) MCG/ACT IN AERS: 2.0000 | INHALATION_SPRAY | Freq: Once | RESPIRATORY_TRACT | Status: DC | PRN

## 2020-10-26 MED ORDER — SOTROVIMAB 500 MG/8ML IV SOLN
500.0000 mg | Freq: Once | INTRAVENOUS | Status: AC
  Administered 2020-10-26: 500 mg via INTRAVENOUS

## 2020-10-26 MED ORDER — METHYLPREDNISOLONE SODIUM SUCC 125 MG IJ SOLR: 125.0000 mg | Freq: Once | INTRAMUSCULAR | Status: DC | PRN

## 2020-10-26 MED ORDER — EPINEPHRINE 0.3 MG/0.3ML IJ SOAJ: 0.3000 mg | Freq: Once | INTRAMUSCULAR | Status: DC | PRN

## 2020-10-26 NOTE — Progress Notes (Signed)
I connected by phone with Dustin Schneider on 10/26/2020 at 10:20 AM to discuss the potential use of a new treatment for mild to moderate COVID-19 viral infection in non-hospitalized patients.  This patient is a 65 y.o. male that meets the FDA criteria for Emergency Use Authorization of COVID monoclonal antibody sotrovimab.  Has a (+) direct SARS-CoV-2 viral test result  Has mild or moderate COVID-19   Is NOT hospitalized due to COVID-19  Is within 10 days of symptom onset  Has at least one of the high risk factor(s) for progression to severe COVID-19 and/or hospitalization as defined in EUA.  Specific high risk criteria : Cardiovascular disease or hypertension   Sx onset 3/14  Cost reviewed   I have spoken and communicated the following to the patient or parent/caregiver regarding COVID monoclonal antibody treatment:  1. FDA has authorized the emergency use for the treatment of mild to moderate COVID-19 in adults and pediatric patients with positive results of direct SARS-CoV-2 viral testing who are 60 years of age and older weighing at least 40 kg, and who are at high risk for progressing to severe COVID-19 and/or hospitalization.  2. The significant known and potential risks and benefits of COVID monoclonal antibody, and the extent to which such potential risks and benefits are unknown.  3. Information on available alternative treatments and the risks and benefits of those alternatives, including clinical trials.  4. Patients treated with COVID monoclonal antibody should continue to self-isolate and use infection control measures (e.g., wear mask, isolate, social distance, avoid sharing personal items, clean and disinfect "high touch" surfaces, and frequent handwashing) according to CDC guidelines.   5. The patient or parent/caregiver has the option to accept or refuse COVID monoclonal antibody treatment.  After reviewing this information with the patient, the patient has agreed to  receive one of the available covid 19 monoclonal antibodies and will be provided an appropriate fact sheet prior to infusion. Scot Dock, NP 10/26/2020 10:20 AM

## 2020-10-26 NOTE — Progress Notes (Signed)
Diagnosis: COVID-19  Physician: Dr. Patrick Wright  Procedure: Covid Infusion Clinic Med: Sotrovimab infusion - Provided patient with sotrovimab fact sheet for patients, parents, and caregivers prior to infusion.   Complications: No immediate complications noted  Discharge: Discharged home    

## 2020-10-26 NOTE — Progress Notes (Signed)
Patient reviewed Fact Sheet for Patients, Parents, and Caregivers for Emergency Use Authorization (EUA) of sotrovimab for the Treatment of Coronavirus. Patient also reviewed and is agreeable to the estimated cost of treatment. Patient is agreeable to proceed.   

## 2020-10-26 NOTE — Telephone Encounter (Signed)
See additional note. 

## 2020-10-26 NOTE — Telephone Encounter (Signed)
Called to discuss with patient about COVID-19 symptoms and the use of one of the available treatments for those with mild to moderate Covid symptoms and at a high risk of hospitalization.  Pt appears to qualify for outpatient treatment due to co-morbid conditions and/or a member of an at-risk group in accordance with the FDA Emergency Use Authorization.    Symptom onset: 10/24/20 Cough,fever,congestion Vaccinated: Yes Booster? Yes Immunocompromised? No Qualifiers: HTN,CVA, A-fib  Unable to reach pt - Would like to speak with APP.  Dustin Schneider

## 2020-10-26 NOTE — Discharge Instructions (Signed)

## 2020-10-27 ENCOUNTER — Ambulatory Visit: Payer: BC Managed Care – PPO | Admitting: Physical Therapy

## 2020-10-27 ENCOUNTER — Other Ambulatory Visit: Payer: Self-pay

## 2020-10-27 ENCOUNTER — Ambulatory Visit: Payer: BC Managed Care – PPO

## 2020-10-27 MED ORDER — APIXABAN 5 MG PO TABS: 5.0000 mg | ORAL_TABLET | Freq: Two times a day (BID) | ORAL | 6 refills | Status: DC

## 2020-10-31 ENCOUNTER — Ambulatory Visit: Payer: BC Managed Care – PPO | Admitting: Physical Therapy

## 2020-11-03 ENCOUNTER — Other Ambulatory Visit: Payer: Self-pay

## 2020-11-03 ENCOUNTER — Encounter: Payer: Self-pay | Admitting: Physical Medicine & Rehabilitation

## 2020-11-03 ENCOUNTER — Ambulatory Visit: Payer: BC Managed Care – PPO

## 2020-11-03 ENCOUNTER — Encounter
Payer: BC Managed Care – PPO | Attending: Physical Medicine & Rehabilitation | Admitting: Physical Medicine & Rehabilitation

## 2020-11-03 VITALS — BP 122/82 | HR 66 | Temp 98.8°F | Ht 66.5 in | Wt 155.0 lb

## 2020-11-03 DIAGNOSIS — R269 Unspecified abnormalities of gait and mobility: Secondary | ICD-10-CM | POA: Diagnosis not present

## 2020-11-03 DIAGNOSIS — G8110 Spastic hemiplegia affecting unspecified side: Secondary | ICD-10-CM | POA: Insufficient documentation

## 2020-11-03 DIAGNOSIS — R4701 Aphasia: Secondary | ICD-10-CM | POA: Insufficient documentation

## 2020-11-03 DIAGNOSIS — I69322 Dysarthria following cerebral infarction: Secondary | ICD-10-CM | POA: Insufficient documentation

## 2020-11-03 NOTE — Progress Notes (Signed)
Subjective:    Patient ID: Dustin Schneider, male    DOB: [birth year], 65 y.o.   MRN: [id number]  HPI Male with history of PAF/on Xarelto, and ICM presents for hospital follow up for left MCA infarct.    Last clinic visit on 09/22/20.  He had botulinum toxin at that time.  Girlfriend supplements history. Since that time, pt states he does not want injections anymore. He is still in therapies.  Denies falls. Using cane for ambulation and navigating stairs. He is using Baclofen. He is not using his PRAFO.  Pain Inventory Average Pain 0 Pain Right Now 0 My pain is right arm & hand spasms.  In the last 24 hours, has pain interfered with the following? General activity 0 Relation with others 0 Enjoyment of life 0 What TIME of day is your pain at its worst? n/a Sleep (in general) Fair  Pain is worse with: no pain Pain improves with: no pain Relief from Meds: no pain  Mobility walk with assistance use a cane ability to climb steps?  no do you drive?  no use a wheelchair needs help with transfers  Function disabled: date disabled 02/02/2020 Do you have any goals in this area?  yes  Neuro/Psych No problems in this area trouble walking  Prior Studies Any changes since last visit?  no  Physicians involved in your care Any changes since last visit?  no   Family History  Problem Relation Age of Onset  . Glaucoma Mother   . Atrial fibrillation Father    Social History   Socioeconomic History  . Marital status: Divorced    Spouse name: Not on file  . Number of children: 1  . Years of education: Not on file  . Highest education level: Not on file  Occupational History  . Not on file  Tobacco Use  . Smoking status: Never Smoker  . Smokeless tobacco: Never Used  Vaping Use  . Vaping Use: Never used  Substance and Sexual Activity  . Alcohol use: Not Currently  . Drug use: No  . Sexual activity: Not on file    Comment: DIVORCE  Other Topics Concern  . 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 BUBBLE STUDY  03/11/2020   Procedure: BUBBLE STUDY;  Surgeon: [name], MD;  Location: [place];  Service: Cardiovascular;;  . CARDIOVERSION N/A 05/13/2018   Procedure: CARDIOVERSION;  Surgeon: [name], MD;  Location: [facility] ENDOSCOPY;  Service: Cardiovascular;  Laterality: N/A;  . CARDIOVERSION N/A 06/13/2018   Procedure: CARDIOVERSION;  Surgeon: [name], MD;  Location: [facility] ENDOSCOPY;  Service: Cardiovascular;  Laterality: N/A;  . CARDIOVERSION N/A 06/23/2018   Procedure: CARDIOVERSION;  Surgeon: [name], MD;  Location: [facility] ENDOSCOPY;  Service: Cardiovascular;  Laterality: N/A;  . CARDIOVERSION N/A 03/11/2020   Procedure: CARDIOVERSION;  Surgeon: [name], MD;  Location: [place];  Service: Cardiovascular;  Laterality: N/A;  . excision of actinic keratosis    . EYE SURGERY     eye lift  . IR CT HEAD LTD  01/27/2020  . IR PERCUTANEOUS ART  THROMBECTOMY/INFUSION INTRACRANIAL INC DIAG ANGIO  01/27/2020      . IR PERCUTANEOUS ART THROMBECTOMY/INFUSION INTRACRANIAL INC DIAG ANGIO  01/27/2020  . RADIOLOGY WITH ANESTHESIA N/A 01/27/2020   Procedure: IR WITH ANESTHESIA;  Surgeon: Radiologist, Medication, MD;  Location: [place];  Service: Radiology;  Laterality: N/A;  . TEE WITHOUT CARDIOVERSION N/A 03/11/2020   Procedure: TRANSESOPHAGEAL ECHOCARDIOGRAM (TEE);  Surgeon: [name], MD;  Location: [place];  Service: Cardiovascular;  Laterality: N/A;  . TONSILLECTOMY    . WISDOM TOOTH EXTRACTION     Past Medical History:  Diagnosis Date  . Hypertension   . NICM (nonischemic cardiomyopathy) ([place]) 06/15/2018    04/23/18: EF 45%, 09/15/18: NOrmal LVEF  . Paroxysmal atrial fibrillation ([place]) 06/15/2018  . Stroke ([place])   . Visit for monitoring Tikosyn therapy 06/12/2018   BP 122/82   Pulse 66   Temp 98.8 F (37.1 C)   Ht 5' 6.5" (1.689 m)   Wt 155 lb (70.3 kg)   SpO2 94%   BMI 24.64 kg/m   Opioid Risk Score:   Fall Risk Score:  `1  Depression screen PHQ 2/9  Depression screen [facility] 2/9 06/21/2020 06/09/2020 03/17/2020  Decreased Interest 0 0 0  Down, Depressed, Hopeless 0 0 0  PHQ - 2 Score 0 0 0  Altered sleeping - - 1  Tired, decreased energy - - 0  Change in appetite - - 0  Feeling bad or failure about yourself  - - 0  Trouble concentrating - - 1  Moving slowly or fidgety/restless - - 3  Suicidal thoughts - - 0  PHQ-9 Score - - 5  Difficult doing work/chores - - Not difficult at all  Some recent data might be hidden    Review of Systems  Constitutional: Negative.   HENT: Negative.   Eyes: Negative.   Respiratory: Negative.   Cardiovascular: Negative.   Gastrointestinal: Negative.   Endocrine: Negative.   Genitourinary: Negative.   Musculoskeletal: Positive for gait problem.  Skin: Negative.   Allergic/Immunologic: Negative.   Neurological: Positive for tremors, speech difficulty, weakness and numbness.  Hematological: Negative.   Psychiatric/Behavioral: Negative.   All other systems reviewed and are negative.     Objective:   Physical Exam  Constitutional: No distress . Vital signs reviewed. HENT: Normocephalic.  Atraumatic. Eyes: EOMI. No discharge. Cardiovascular: No JVD.   Respiratory: Normal effort.  No stridor.   GI: Non-distended.   Skin: Warm and dry.  Intact. Psych: Normal mood.  Normal behavior. Musc: No edema in extremities.  No tenderness in extremities. Neuro: Alert Expressive aphasia, stable Right facial weakness, improving Motor: RUE: Shoulder abduction 4-/5, elbow ext 3+/5, elbow flex 2+/5, wrist extension 2-/5, hand grip 2+/5 with apraxia RLE: Hip  flexion, knee extension 4/5, distally 0/5 [name]:  Right wrist flexors 1+/4, finger flexors 0/4  Right ankle inverters 1/4    Assessment & Plan:  Male with history of PAF/on Xarelto, and ICM presents for hospital follow up for left MCA infarct.    1. Right hemiparesis, now with spasticity, impaired mobility and ADLs secondary to left MCA ischemic stroke  Continue therapies   Continue follow up with Neurology  Encouraged WHO/PRAFO nightly, again  Transitioned from Botox to Xeomin:   Right    Flexor Hallucis Longus: 35               Flexor Digitorum Longus: 35               Gastroc Med: 30  Patient would like to hold off on injections at this time, would consider increase in dose of RLE muscles as well as right wrist flexors if patient chooses to pursue  2.  Post stroke dysarthria  Cont accommodations   Continue therapies  3.  Urinary retention/Neurogenic bladder:   Resolved  4.  Spasticity  Tizanidine caused lethargy  Continue Baclofen 20 TID  5. Gait abnormality  Continue therapies  Now using cane and navigating stairs

## 2020-11-07 ENCOUNTER — Ambulatory Visit: Payer: BC Managed Care – PPO | Admitting: Physical Therapy

## 2020-11-07 ENCOUNTER — Other Ambulatory Visit: Payer: Self-pay

## 2020-11-07 ENCOUNTER — Encounter: Payer: Self-pay | Admitting: Physical Therapy

## 2020-11-07 VITALS — HR 76

## 2020-11-07 DIAGNOSIS — M6281 Muscle weakness (generalized): Secondary | ICD-10-CM | POA: Diagnosis not present

## 2020-11-07 DIAGNOSIS — R2689 Other abnormalities of gait and mobility: Secondary | ICD-10-CM

## 2020-11-07 DIAGNOSIS — R2681 Unsteadiness on feet: Secondary | ICD-10-CM

## 2020-11-07 DIAGNOSIS — R29818 Other symptoms and signs involving the nervous system: Secondary | ICD-10-CM

## 2020-11-07 NOTE — Therapy (Signed)
[facility] [address] [place], [place], [address] Phone: [phone]   Fax:  [phone]  Physical Therapy Treatment  Patient Details  Name: Dustin Schneider MRN: [id number] Date of Birth: [birth year] Referring Provider (PT): Lauraine Rinne, PA-C but followed by Delice Lesch   Encounter Date: 11/07/2020   PT End of Session - 11/07/20 1555    Visit Number 39    Number of Visits 71    Authorization Type BCBS    PT Start Time 0847    PT Stop Time 0930    PT Time Calculation (min) 43 min    Equipment Utilized During Treatment Gait belt    Activity Tolerance Patient tolerated treatment well    Behavior During Therapy [facility] for tasks assessed/performed           Past Medical History:  Diagnosis Date  . Hypertension   . NICM (nonischemic cardiomyopathy) ([place]) 06/15/2018   04/23/18: EF 45%, 09/15/18: NOrmal LVEF  . Paroxysmal atrial fibrillation ([place]) 06/15/2018  . Stroke ([place])   . Visit for monitoring Tikosyn therapy 06/12/2018    Past Surgical History:  Procedure Laterality Date  . BUBBLE STUDY  03/11/2020   Procedure: BUBBLE STUDY;  Surgeon: [name], MD;  Location: [place];  Service: Cardiovascular;;  . CARDIOVERSION N/A 05/13/2018   Procedure: CARDIOVERSION;  Surgeon: [name], MD;  Location: [facility] ENDOSCOPY;  Service: Cardiovascular;  Laterality: N/A;  . CARDIOVERSION N/A 06/13/2018   Procedure: CARDIOVERSION;  Surgeon: [name], MD;  Location: [facility] ENDOSCOPY;  Service: Cardiovascular;  Laterality: N/A;  . CARDIOVERSION N/A 06/23/2018   Procedure: CARDIOVERSION;  Surgeon: [name], MD;  Location: [facility] ENDOSCOPY;  Service: Cardiovascular;  Laterality: N/A;  . CARDIOVERSION N/A 03/11/2020   Procedure: CARDIOVERSION;  Surgeon: [name], MD;  Location: [place];  Service: Cardiovascular;  Laterality: N/A;  . excision of actinic keratosis    . EYE SURGERY     eye lift  . IR CT HEAD LTD  01/27/2020  .  IR PERCUTANEOUS ART THROMBECTOMY/INFUSION INTRACRANIAL INC DIAG ANGIO  01/27/2020      . IR PERCUTANEOUS ART THROMBECTOMY/INFUSION INTRACRANIAL INC DIAG ANGIO  01/27/2020  . RADIOLOGY WITH ANESTHESIA N/A 01/27/2020   Procedure: IR WITH ANESTHESIA;  Surgeon: Radiologist, Medication, MD;  Location: [place];  Service: Radiology;  Laterality: N/A;  . TEE WITHOUT CARDIOVERSION N/A 03/11/2020   Procedure: TRANSESOPHAGEAL ECHOCARDIOGRAM (TEE);  Surgeon: [name], MD;  Location: [facility];  Service: Cardiovascular;  Laterality: N/A;  . TONSILLECTOMY    . WISDOM TOOTH EXTRACTION      Vitals:   11/07/20 0857  Pulse: 76  SpO2: 98%     Subjective Assessment - 11/07/20 0854    Subjective had COVID. doing much better.    Pertinent History PMH: HTN, paroxysmal a fib    Limitations Standing;Walking    Patient Stated Goals "wants everything working on R side again"                             [facility] Adult PT Treatment/Exercise - 11/07/20 0913      Ambulation/Gait   Ambulation/Gait Yes    Ambulation/Gait Assistance 5: Supervision;4: Min guard    Ambulation/Gait Assistance Details 2 laps at start of session with SPC with 6 prong tip, cues for incr step length with LLE at times for incr RLE stance time. pt reports he was still walking laps around his house with cane  at home when he had COVID    Ambulation Distance (Feet) 230 Feet    Assistive device Straight cane    Gait Pattern Step-through pattern;Decreased stance time - right;Decreased step length - left;Decreased hip/knee flexion - right    Ambulation Surface Level;Indoor    Stairs Yes    Stairs Assistance 4: Min guard;4: Min assist    Stairs Assistance Details (indicate cue type and reason) 1st set with railing, 2nd and 3rd with use of cane, one episode of min A for balance when descending stairs. cues to place feet fully on the steps at times before stepping up    Stair Management Technique Step to pattern;With cane;One rail  Left    Number of Stairs 12    Height of Stairs 6    Curb 4: Min assist    Curb Details (indicate cue type and reason) x2 reps with SPC with 6 prong tip, needs assist from therapist to clear foot as pt caught R toes under lip of the curb      Exercises   Exercises Other Exercises    Other Exercises  supine marching x10 reps AROM, x10 reps with manual therapist resistance into hip flexion and then with eccentric control back down to mat. x3 reps sit <> stands on red balance beam at edge of mat with chair in front of pt for balance as needed with no UE support, performed x2 reps with pt closing eyes during descent with min guard/min A for incr vestibular input for balance      Knee/Hip Exercises: Aerobic   Other Aerobic Sci-Fit x 6 min level 3.7 with BLE and LUE at times with verbal cues to focus on keeping right hip in neutral position. HR after going back to mat: 80 bpm, O2 98%                    PT Short Term Goals - 10/20/20 1340      PT SHORT TERM GOAL #1   Title Pt will decrease TUG with cane from 18.52 sec to < 15 sec for improved balance and functional mobility.    Baseline 10/20/20 18.52 sec    Time 4    Period Weeks    Status New    Target Date 11/19/20      PT SHORT TERM GOAL #2   Title Pt will ambulate with cane with quad tip >600' on level surfaces supervision for improved household and short community distances.    Time 4    Period Weeks    Status New    Target Date 11/19/20      PT SHORT TERM GOAL #3   Title Pt will increase gait speed from 0.59m/s with cane to >0.19m/s for improved mobility.    Baseline 10/20/20 0.69m/s with cane    Time 4    Period Weeks    Status New    Target Date 11/19/20             PT Long Term Goals - 10/20/20 1343      PT LONG TERM GOAL #1   Title Pt will ambulate >800' on varied surfaces with cane supervision for improved community mobility.    Time 8    Period Weeks    Status New    Target Date 12/19/20      PT  LONG TERM GOAL #2   Title Pt will increase gait speed with cane to >0.77m/s for improved community mobility.  Baseline 10/20/20 0.85m/s    Time 8    Period Weeks    Status New    Target Date 12/19/20      PT LONG TERM GOAL #3   Title Pt will increase Berg from 45 to >48/56 for improved balance and decreased fall risk.    Baseline 08/26/20 38/56. 10/20/20 45/56    Time 8    Period Weeks    Status Revised    Target Date 12/19/20      PT LONG TERM GOAL #4   Title Pt will ambulate up/down 2 steps with LRAD supervision for improved access in home to living room    Baseline when performing curb with cane needs min guard/min A at times for R foot clearance, 4 steps with cane min guard ascending, min A descending    Time 8    Period Weeks    Status On-going    Target Date 12/19/20                 Plan - 11/07/20 1556    Clinical Impression Statement Pt returns to PT today after having COVID. [name] O2 sats and HR WFL throughout session. Prior to pt having COVID, pt was able to perform a curb with min guard and pt able to clear RLE on his own, today pt needing min A due to pt getting RLE caught on lip of curb. Pt needing min guard/one instance of min A for balance today while descending stairs with SPC and quad tip. Will continue to progress towards LTGs. Pt ambulating in and out of session today with cane.    Personal Factors and Comorbidities Comorbidity 3+    Comorbidities dense L MCA CVA, paroxysmal A. fib on chronic Xarelto, nonischemic cardiomyopathy and hypertension.    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, works as an Electrical engineer Evolving/Moderate complexity    Rehab Potential Good    PT Frequency 2x / week    PT Duration 8 weeks    PT  Treatment/Interventions ADLs/Self Care Home Management;Aquatic Therapy;DME Instruction;Gait training;Stair training;Functional mobility training;Therapeutic activities;Therapeutic exercise;Electrical Stimulation;Balance training;Neuromuscular re-education;Wheelchair mobility training;Orthotic Fit/Training;Patient/family education;Passive range of motion;Energy conservation    PT Next Visit Plan continue R hip flexion strengthening.  stair and curb training with cane.  RLE NMR with decr UE support, balance on compliant surfaces. SciFit for endurane/strengthening    PT Home Exercise Plan on 06/28/20 - added standing at countetop and stepping LLE out and in    Consulted and Agree with Plan of Care Patient;Family member/caregiver    Family Member Consulted [name]           Patient will benefit from skilled therapeutic intervention in order to improve the following deficits and impairments:  Abnormal gait,Decreased activity tolerance,Decreased balance,Decreased cognition,Decreased mobility,Decreased coordination,Decreased range of motion,Decreased endurance,Decreased strength,Hypomobility,Difficulty walking,Impaired UE functional use,Impaired vision/preception,Postural dysfunction,Impaired sensation  Visit Diagnosis: Other abnormalities of gait and mobility  Muscle weakness (generalized)  Unsteadiness on feet  Other symptoms and signs involving the nervous system     Problem List Patient Active Problem List   Diagnosis Date Noted  . Dysarthria, post-stroke 11/03/2020  . Benign essential HTN 06/09/2020  . Abnormality of gait 04/28/2020  . Neurogenic bladder 03/17/2020  . Sepsis due to pneumonia ([place]) 03/06/2020  . History of stroke with residual deficit 03/06/2020  . Transient hypotension 03/06/2020  . Spastic hemiplegia affecting nondominant side ([place])   .  History of hypertension   . Expressive aphasia   . AKI (acute kidney injury) ([place])   . Global aphasia   . Hypoalbuminemia due to  protein-calorie malnutrition ([place])   . Dysphagia, post-stroke   . Hyperlipidemia 02/02/2020  . Dysphagia following cerebral infarction 02/02/2020  . Aspiration pneumonia ([place]) 02/02/2020  . Acute ischemic left MCA stroke ([place]) 02/02/2020  . Acute respiratory failure ([place])   . Acute ischemic stroke [facility]) s/p clot retrieval L MCA & ACA A3 01/27/2020  . Aspiration into airway 01/27/2020  . Chronic anticoagulation 01/27/2020  . Paroxysmal atrial fibrillation ([place]) 06/15/2018  . Essential hypertension 06/15/2018  . NICM (nonischemic cardiomyopathy) ([place]) 06/15/2018  . Visit for monitoring Tikosyn therapy 06/12/2018    Lillia Pauls, DPT  11/07/2020, 4:04 PM  [facility] [address] [place], [place], [address] Phone: [phone]   Fax:  [phone]  Name: GARRIS MELHORN MRN: [id number] Date of Birth: [birth year]

## 2020-11-10 ENCOUNTER — Ambulatory Visit: Payer: BC Managed Care – PPO

## 2020-11-10 ENCOUNTER — Other Ambulatory Visit: Payer: Self-pay

## 2020-11-10 VITALS — HR 60

## 2020-11-10 DIAGNOSIS — R2689 Other abnormalities of gait and mobility: Secondary | ICD-10-CM

## 2020-11-10 DIAGNOSIS — M6281 Muscle weakness (generalized): Secondary | ICD-10-CM

## 2020-11-10 NOTE — Therapy (Signed)
[facility] [address] [place], [place], [address] Phone: [phone]   Fax:  [phone]  Physical Therapy Treatment  Patient Details  Name: Dustin Schneider MRN: [id number] Date of Birth: [birth year] Referring Provider (PT): Lauraine Rinne, PA-C but followed by Delice Lesch   Encounter Date: 11/10/2020   PT End of Session - 11/10/20 0941    Visit Number 23    Number of Visits 71    Authorization Type BCBS    PT Start Time [phone]    PT Stop Time 1016    PT Time Calculation (min) 38 min    Equipment Utilized During Treatment Gait belt    Activity Tolerance Patient tolerated treatment well    Behavior During Therapy [facility] for tasks assessed/performed           Past Medical History:  Diagnosis Date  . Hypertension   . NICM (nonischemic cardiomyopathy) ([place]) 06/15/2018   04/23/18: EF 45%, 09/15/18: NOrmal LVEF  . Paroxysmal atrial fibrillation ([place]) 06/15/2018  . Stroke ([place])   . Visit for monitoring Tikosyn therapy 06/12/2018    Past Surgical History:  Procedure Laterality Date  . BUBBLE STUDY  03/11/2020   Procedure: BUBBLE STUDY;  Surgeon: [name], MD;  Location: [place];  Service: Cardiovascular;;  . CARDIOVERSION N/A 05/13/2018   Procedure: CARDIOVERSION;  Surgeon: [name], MD;  Location: [facility] ENDOSCOPY;  Service: Cardiovascular;  Laterality: N/A;  . CARDIOVERSION N/A 06/13/2018   Procedure: CARDIOVERSION;  Surgeon: [name], MD;  Location: [facility] ENDOSCOPY;  Service: Cardiovascular;  Laterality: N/A;  . CARDIOVERSION N/A 06/23/2018   Procedure: CARDIOVERSION;  Surgeon: [name], MD;  Location: [facility] ENDOSCOPY;  Service: Cardiovascular;  Laterality: N/A;  . CARDIOVERSION N/A 03/11/2020   Procedure: CARDIOVERSION;  Surgeon: [name], MD;  Location: [place];  Service: Cardiovascular;  Laterality: N/A;  . excision of actinic keratosis    . EYE SURGERY     eye lift  . IR CT HEAD LTD  01/27/2020  .  IR PERCUTANEOUS ART THROMBECTOMY/INFUSION INTRACRANIAL INC DIAG ANGIO  01/27/2020      . IR PERCUTANEOUS ART THROMBECTOMY/INFUSION INTRACRANIAL INC DIAG ANGIO  01/27/2020  . RADIOLOGY WITH ANESTHESIA N/A 01/27/2020   Procedure: IR WITH ANESTHESIA;  Surgeon: Radiologist, Medication, MD;  Location: [place];  Service: Radiology;  Laterality: N/A;  . TEE WITHOUT CARDIOVERSION N/A 03/11/2020   Procedure: TRANSESOPHAGEAL ECHOCARDIOGRAM (TEE);  Surgeon: [name], MD;  Location: [place];  Service: Cardiovascular;  Laterality: N/A;  . TONSILLECTOMY    . WISDOM TOOTH EXTRACTION      Vitals:   11/10/20 0943  Pulse: 60     Subjective Assessment - 11/10/20 0941    Subjective Pt reports he is doing well. Walked in with cane and showed therapist some steps without AD.    Pertinent History PMH: HTN, paroxysmal a fib    Limitations Standing;Walking    Patient Stated Goals "wants everything working on R side again"    Currently in Pain? No/denies                             [facility] Adult PT Treatment/Exercise - 11/10/20 0942      Ambulation/Gait   Ambulation/Gait Yes    Ambulation/Gait Assistance 5: Supervision    Ambulation/Gait Assistance Details Verbal cues to focus on increasing right stance time.    Ambulation Distance (Feet) 230 Feet    Assistive device Straight cane  with quad tip and right AFO   Gait Pattern Step-through pattern;Decreased step length - right;Decreased step length - left;Decreased hip/knee flexion - right;Decreased stance time - right    Ambulation Surface Level;Indoor    Stairs Yes    Stairs Assistance 5: Supervision;4: Min guard    Stair Management Technique Step to pattern;One rail Left    Number of Stairs 8    Height of Stairs 6    Curb 5: Supervision;4: Min assist    Curb Details (indicate cue type and reason) x 6. 1 catch on lip going up with RLE but pt did well maintaining balance while PT assisted to clear.      Neuro Re-ed    Neuro Re-ed  Details  In // bars: reciprocal steps over 4 2x4" bolsters x 8 bouts. Pt did have right foot catch at times when trailing. Stepping over and back bolster with RLE x 10 with tactile cues to prevent hip hike. Standing in front of mirror working on elongating left side and shifting weight through right with stepping forward with LLE raising ball overhead then stepping back x 10. Tactile cues to stay up tall through RLE. Sit to stand x 10 without hands with green theraband resistance posterior then same with feet on airex.      Exercises   Exercises Other Exercises    Other Exercises  Standing hip flexion right with PT blocking hip hike x 8 then seated x 10 with PT preventing trunk lean                    PT Short Term Goals - 10/20/20 1340      PT SHORT TERM GOAL #1   Title Pt will decrease TUG with cane from 18.52 sec to < 15 sec for improved balance and functional mobility.    Baseline 10/20/20 18.52 sec    Time 4    Period Weeks    Status New    Target Date 11/19/20      PT SHORT TERM GOAL #2   Title Pt will ambulate with cane with quad tip >600' on level surfaces supervision for improved household and short community distances.    Time 4    Period Weeks    Status New    Target Date 11/19/20      PT SHORT TERM GOAL #3   Title Pt will increase gait speed from 0.69m/s with cane to >0.73m/s for improved mobility.    Baseline 10/20/20 0.21m/s with cane    Time 4    Period Weeks    Status New    Target Date 11/19/20             PT Long Term Goals - 10/20/20 1343      PT LONG TERM GOAL #1   Title Pt will ambulate >800' on varied surfaces with cane supervision for improved community mobility.    Time 8    Period Weeks    Status New    Target Date 12/19/20      PT LONG TERM GOAL #2   Title Pt will increase gait speed with cane to >0.13m/s for improved community mobility.    Baseline 10/20/20 0.92m/s    Time 8    Period Weeks    Status New    Target Date 12/19/20       PT LONG TERM GOAL #3   Title Pt will increase Berg from 45 to >48/56 for improved balance and decreased fall  risk.    Baseline 08/26/20 38/56. 10/20/20 45/56    Time 8    Period Weeks    Status Revised    Target Date 12/19/20      PT LONG TERM GOAL #4   Title Pt will ambulate up/down 2 steps with LRAD supervision for improved access in home to living room    Baseline when performing curb with cane needs min guard/min A at times for R foot clearance, 4 steps with cane min guard ascending, min A descending    Time 8    Period Weeks    Status On-going    Target Date 12/19/20                 Plan - 11/10/20 1814    Clinical Impression Statement PT continued to work on right hip flexor strength with NMR and gait activities. Does have trouble isolating hip flexion. Pt showing improving balance with gait.    Personal Factors and Comorbidities Comorbidity 3+    Comorbidities dense L MCA CVA, paroxysmal A. fib on chronic Xarelto, nonischemic cardiomyopathy and hypertension.    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, works as an Electrical engineer Evolving/Moderate complexity    Rehab Potential Good    PT Frequency 2x / week    PT Duration 8 weeks    PT Treatment/Interventions ADLs/Self Care Home Management;Aquatic Therapy;DME Instruction;Gait training;Stair training;Functional mobility training;Therapeutic activities;Therapeutic exercise;Electrical Stimulation;Balance training;Neuromuscular re-education;Wheelchair mobility training;Orthotic Fit/Training;Patient/family education;Passive range of motion;Energy conservation    PT Next Visit Plan Let's try BWS over treadmill to try to help facilitate more right hip/knee flexion. continue R hip flexion strengthening.  stair  and curb training with cane.  RLE NMR with decr UE support, balance on compliant surfaces. SciFit for endurane/strengthening    PT Home Exercise Plan on 06/28/20 - added standing at countetop and stepping LLE out and in    Consulted and Agree with Plan of Care Patient;Family member/caregiver    Family Member Consulted [name]           Patient will benefit from skilled therapeutic intervention in order to improve the following deficits and impairments:  Abnormal gait,Decreased activity tolerance,Decreased balance,Decreased cognition,Decreased mobility,Decreased coordination,Decreased range of motion,Decreased endurance,Decreased strength,Hypomobility,Difficulty walking,Impaired UE functional use,Impaired vision/preception,Postural dysfunction,Impaired sensation  Visit Diagnosis: Other abnormalities of gait and mobility  Muscle weakness (generalized)     Problem List Patient Active Problem List   Diagnosis Date Noted  . Dysarthria, post-stroke 11/03/2020  . Benign essential HTN 06/09/2020  . Abnormality of gait 04/28/2020  . Neurogenic bladder 03/17/2020  . Sepsis due to pneumonia ([place]) 03/06/2020  . History of stroke with residual deficit 03/06/2020  . Transient hypotension 03/06/2020  . Spastic hemiplegia affecting nondominant side ([place])   . History of hypertension   . Expressive aphasia   . AKI (acute kidney injury) ([place])   . Global aphasia   . Hypoalbuminemia due to protein-calorie malnutrition ([place])   . Dysphagia, post-stroke   . Hyperlipidemia 02/02/2020  . Dysphagia following cerebral infarction 02/02/2020  . Aspiration pneumonia ([place]) 02/02/2020  . Acute ischemic left MCA stroke ([place]) 02/02/2020  . Acute respiratory failure ([place])   . Acute ischemic stroke [facility]) s/p clot retrieval L MCA & ACA A3 01/27/2020  . Aspiration into airway 01/27/2020  . Chronic anticoagulation 01/27/2020  . Paroxysmal atrial fibrillation ([place]) 06/15/2018  . Essential hypertension 06/15/2018   . NICM (nonischemic  cardiomyopathy) ([place]) 06/15/2018  . Visit for monitoring Tikosyn therapy 06/12/2018    Electa Sniff, PT, DPT, NCS 11/10/2020, 6:16 PM  [facility] [address] [place], [place], [address] Phone: [phone]   Fax:  [phone]  Name: Dustin Schneider MRN: [id number] Date of Birth: [birth year]

## 2020-11-14 ENCOUNTER — Other Ambulatory Visit: Payer: Self-pay

## 2020-11-14 ENCOUNTER — Encounter: Payer: Self-pay | Admitting: Physical Therapy

## 2020-11-14 ENCOUNTER — Ambulatory Visit: Payer: BC Managed Care – PPO | Attending: Physician Assistant | Admitting: Physical Therapy

## 2020-11-14 DIAGNOSIS — R2681 Unsteadiness on feet: Secondary | ICD-10-CM | POA: Diagnosis present

## 2020-11-14 DIAGNOSIS — R29818 Other symptoms and signs involving the nervous system: Secondary | ICD-10-CM | POA: Insufficient documentation

## 2020-11-14 DIAGNOSIS — M6281 Muscle weakness (generalized): Secondary | ICD-10-CM | POA: Insufficient documentation

## 2020-11-14 DIAGNOSIS — R2689 Other abnormalities of gait and mobility: Secondary | ICD-10-CM | POA: Insufficient documentation

## 2020-11-14 NOTE — Therapy (Addendum)
[facility] [address] [place], [place], [address] Phone: [phone]   Fax:  [phone]  Physical Therapy Treatment  Patient Details  Name: Dustin Schneider MRN: [id number] Date of Birth: 1955-10-10 Referring Provider (PT): Lauraine Rinne, PA-C but followed by Delice Lesch   Encounter Date: 11/14/2020   PT End of Session - 11/14/20 0939    Visit Number 14    Number of Visits 6    Authorization Type BCBS    PT Start Time 0845    PT Stop Time 0926    PT Time Calculation (min) 41 min    Equipment Utilized During Treatment Gait belt    Activity Tolerance Patient tolerated treatment well    Behavior During Therapy [facility] for tasks assessed/performed           Past Medical History:  Diagnosis Date  . Hypertension   . NICM (nonischemic cardiomyopathy) ([place]) 06/15/2018   04/23/18: EF 45%, 09/15/18: NOrmal LVEF  . Paroxysmal atrial fibrillation ([place]) 06/15/2018  . Stroke ([place])   . Visit for monitoring Tikosyn therapy 06/12/2018    Past Surgical History:  Procedure Laterality Date  . BUBBLE STUDY  03/11/2020   Procedure: BUBBLE STUDY;  Surgeon: [name], MD;  Location: [place];  Service: Cardiovascular;;  . CARDIOVERSION N/A 05/13/2018   Procedure: CARDIOVERSION;  Surgeon: [name], MD;  Location: [facility] ENDOSCOPY;  Service: Cardiovascular;  Laterality: N/A;  . CARDIOVERSION N/A 06/13/2018   Procedure: CARDIOVERSION;  Surgeon: [name], MD;  Location: [facility] ENDOSCOPY;  Service: Cardiovascular;  Laterality: N/A;  . CARDIOVERSION N/A 06/23/2018   Procedure: CARDIOVERSION;  Surgeon: [name], MD;  Location: [facility] ENDOSCOPY;  Service: Cardiovascular;  Laterality: N/A;  . CARDIOVERSION N/A 03/11/2020   Procedure: CARDIOVERSION;  Surgeon: [name], MD;  Location: [place];  Service: Cardiovascular;  Laterality: N/A;  . excision of actinic keratosis    . EYE SURGERY     eye lift  . IR CT HEAD LTD  01/27/2020  . IR  PERCUTANEOUS ART THROMBECTOMY/INFUSION INTRACRANIAL INC DIAG ANGIO  01/27/2020      . IR PERCUTANEOUS ART THROMBECTOMY/INFUSION INTRACRANIAL INC DIAG ANGIO  01/27/2020  . RADIOLOGY WITH ANESTHESIA N/A 01/27/2020   Procedure: IR WITH ANESTHESIA;  Surgeon: Radiologist, Medication, MD;  Location: [place];  Service: Radiology;  Laterality: N/A;  . TEE WITHOUT CARDIOVERSION N/A 03/11/2020   Procedure: TRANSESOPHAGEAL ECHOCARDIOGRAM (TEE);  Surgeon: [name], MD;  Location: [place];  Service: Cardiovascular;  Laterality: N/A;  . TONSILLECTOMY    . WISDOM TOOTH EXTRACTION      There were no vitals filed for this visit.   Subjective Assessment - 11/14/20 0848    Subjective Doing good. Walked in with his cane again today.    Pertinent History PMH: HTN, paroxysmal a fib    Limitations Standing;Walking    Patient Stated Goals "wants everything working on R side again"    Currently in Pain? No/denies                             [facility] Adult PT Treatment/Exercise - 11/14/20 0850      Ambulation/Gait   Ambulation/Gait Yes    Ambulation/Gait Assistance 5: Supervision    Ambulation/Gait Assistance Details after BWS: with cane, cues for incr R hip flexion to clear RLE and cues for posture    Ambulation Distance (Feet) 230 Feet    Assistive device Straight cane  with 6 prong tip   Gait Pattern Step-through pattern;Decreased step length - right;Decreased step length - left;Decreased hip/knee flexion - right;Decreased stance time - right    Ambulation Surface Level;Indoor    Gait velocity 20.63 seconds = .48 m/s    Gait Comments gait training over BWS support for ~7 minutes speed between .6-.7 mph; therapist assisting with clearing RLE for incr hip and knee flexion, cues at times for posture and incr step length with LLE at times. post O2: 98%, HR: 68 bpm. BP: 123/71.note: BWS system did not turn on, but still utilized pt being unweighted by raising up the bar      Timed Up and Go  Test   TUG Normal TUG    Normal TUG (seconds) 18.45   with SPC with 6 prong tip                 PT Education - 11/14/20 0940    Education Details verbally reviewed HEP and importance of performing supine hip flexion for RLE    Person(s) Educated Patient   gf [name]   Methods Explanation    Comprehension Verbalized understanding            PT Short Term Goals - 11/14/20 1158      PT SHORT TERM GOAL #1   Title Pt will decrease TUG with cane from 18.52 sec to < 15 sec for improved balance and functional mobility.    Baseline 10/20/20 18.52 sec; 18.45 on 11/14/20    Time 4    Period Weeks    Status Not Met    Target Date 11/19/20      PT SHORT TERM GOAL #2   Title Pt will ambulate with cane with quad tip >600' on level surfaces supervision for improved household and short community distances.    Time 4    Period Weeks    Status New    Target Date 11/19/20      PT SHORT TERM GOAL #3   Title Pt will increase gait speed from 0.43ms with cane to >0.643m for improved mobility.    Baseline 10/20/20 0.5467mwith cane; 11/14/20; 20.63 seconds = .48 m/s    Time 4    Period Weeks    Status Not Met    Target Date 11/19/20              PT Long Term Goals - 10/20/20 1343      PT LONG TERM GOAL #1   Title Pt will ambulate >800' on varied surfaces with cane supervision for improved community mobility.    Time 8    Period Weeks    Status New    Target Date 12/19/20      PT LONG TERM GOAL #2   Title Pt will increase gait speed with cane to >0.68m49mor improved community mobility.    Baseline 10/20/20 0.62m/16m Time 8    Period Weeks    Status New    Target Date 12/19/20      PT LONG TERM GOAL #3   Title Pt will increase Berg from 45 to >48/56 for improved balance and decreased fall risk.    Baseline 08/26/20 38/56. 10/20/20 45/56    Time 8    Period Weeks    Status Revised    Target Date 12/19/20      PT LONG TERM GOAL #4   Title Pt will ambulate up/down 2 steps  with LRAD supervision for improved access  in home to living room    Baseline when performing curb with cane needs min guard/min A at times for R foot clearance, 4 steps with cane min guard ascending, min A descending    Time 8    Period Weeks    Status On-going    Target Date 12/19/20                 Plan - 11/14/20 1203    Clinical Impression Statement Began to check pt's STGs today with pt having a decr in gait speed today with cane and pt's TUG score with cane stayed approx the same since when last assessed. This could be due to pt having COVID and having to miss a few therapy sessions. Utilized BWS on the treadmill with therapist assisting with incr R hip/knee flexion during gait, with pt tolerating well. Did demonstrate improvements in hip flexion overground afterwards. Will continue to progress towards LTGs.    Personal Factors and Comorbidities Comorbidity 3+    Comorbidities dense L MCA CVA, paroxysmal A. fib on chronic Xarelto, nonischemic cardiomyopathy and hypertension.    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, works as an Electrical engineer Evolving/Moderate complexity    Rehab Potential Good    PT Frequency 2x / week    PT Duration 8 weeks    PT Treatment/Interventions ADLs/Self Care Home Management;Aquatic Therapy;DME Instruction;Gait training;Stair training;Functional mobility training;Therapeutic activities;Therapeutic exercise;Electrical Stimulation;Balance training;Neuromuscular re-education;Wheelchair mobility training;Orthotic Fit/Training;Patient/family education;Passive range of motion;Energy conservation    PT Next Visit Plan continue with BWS try to help facilitate more right hip/knee flexion. continue R hip flexion strengthening.  stair and curb  training with cane.  RLE NMR with decr UE support, balance on compliant surfaces. SciFit for endurane/strengthening    PT Home Exercise Plan on 06/28/20 - added standing at countetop and stepping LLE out and in    Consulted and Agree with Plan of Care Patient;Family member/caregiver    Family Member Consulted [name]           Patient will benefit from skilled therapeutic intervention in order to improve the following deficits and impairments:  Abnormal gait,Decreased activity tolerance,Decreased balance,Decreased cognition,Decreased mobility,Decreased coordination,Decreased range of motion,Decreased endurance,Decreased strength,Hypomobility,Difficulty walking,Impaired UE functional use,Impaired vision/preception,Postural dysfunction,Impaired sensation  Visit Diagnosis: Muscle weakness (generalized)  Other abnormalities of gait and mobility  Unsteadiness on feet  Other symptoms and signs involving the nervous system     Problem List Patient Active Problem List   Diagnosis Date Noted  . Dysarthria, post-stroke 11/03/2020  . Benign essential HTN 06/09/2020  . Abnormality of gait 04/28/2020  . Neurogenic bladder 03/17/2020  . Sepsis due to pneumonia ([place]) 03/06/2020  . History of stroke with residual deficit 03/06/2020  . Transient hypotension 03/06/2020  . Spastic hemiplegia affecting nondominant side ([place])   . History of hypertension   . Expressive aphasia   . AKI (acute kidney injury) ([place])   . Global aphasia   . Hypoalbuminemia due to protein-calorie malnutrition ([place])   . Dysphagia, post-stroke   . Hyperlipidemia 02/02/2020  . Dysphagia following cerebral infarction 02/02/2020  . Aspiration pneumonia ([place]) 02/02/2020  . Acute ischemic left MCA stroke ([place]) 02/02/2020  . Acute respiratory failure ([place])   . Acute ischemic stroke [facility]) s/p clot retrieval L MCA & ACA A3 01/27/2020  . Aspiration into airway 01/27/2020  . Chronic anticoagulation 01/27/2020  . Paroxysmal  atrial fibrillation ([place]) 06/15/2018  .  Essential hypertension 06/15/2018  . NICM (nonischemic cardiomyopathy) ([place]) 06/15/2018  . Visit for monitoring Tikosyn therapy 06/12/2018    Arliss Journey , PT, DPT  11/14/2020, 12:04 PM  [facility] [address] [place], [place], [address] Phone: [phone]   Fax:  [phone]  Name: Dustin Schneider MRN: [id number] Date of Birth: [birth year]

## 2020-11-17 ENCOUNTER — Ambulatory Visit: Payer: BC Managed Care – PPO

## 2020-11-17 ENCOUNTER — Other Ambulatory Visit: Payer: Self-pay

## 2020-11-17 DIAGNOSIS — M6281 Muscle weakness (generalized): Secondary | ICD-10-CM

## 2020-11-17 DIAGNOSIS — R2689 Other abnormalities of gait and mobility: Secondary | ICD-10-CM

## 2020-11-17 NOTE — Therapy (Signed)
[facility] [address] [place], [place], [address] Phone: [phone]   Fax:  [phone]  Physical Therapy Treatment  Patient Details  Name: Dustin Schneider MRN: [id number] Date of Birth: 1956/07/10 Referring Provider (PT): Lauraine Rinne, PA-C but followed by Delice Lesch   Encounter Date: 11/17/2020   PT End of Session - 11/17/20 0806    Visit Number 30    Number of Visits 27    Authorization Type BCBS    PT Start Time 0803    PT Stop Time 0844    PT Time Calculation (min) 41 min    Equipment Utilized During Treatment Gait belt    Activity Tolerance Patient tolerated treatment well    Behavior During Therapy [facility] for tasks assessed/performed           Past Medical History:  Diagnosis Date  . Hypertension   . NICM (nonischemic cardiomyopathy) ([place]) 06/15/2018   04/23/18: EF 45%, 09/15/18: NOrmal LVEF  . Paroxysmal atrial fibrillation ([place]) 06/15/2018  . Stroke ([place])   . Visit for monitoring Tikosyn therapy 06/12/2018    Past Surgical History:  Procedure Laterality Date  . BUBBLE STUDY  03/11/2020   Procedure: BUBBLE STUDY;  Surgeon: [name], MD;  Location: [place];  Service: Cardiovascular;;  . CARDIOVERSION N/A 05/13/2018   Procedure: CARDIOVERSION;  Surgeon: [name], MD;  Location: [facility] ENDOSCOPY;  Service: Cardiovascular;  Laterality: N/A;  . CARDIOVERSION N/A 06/13/2018   Procedure: CARDIOVERSION;  Surgeon: [name], MD;  Location: [facility] ENDOSCOPY;  Service: Cardiovascular;  Laterality: N/A;  . CARDIOVERSION N/A 06/23/2018   Procedure: CARDIOVERSION;  Surgeon: [name], MD;  Location: [facility] ENDOSCOPY;  Service: Cardiovascular;  Laterality: N/A;  . CARDIOVERSION N/A 03/11/2020   Procedure: CARDIOVERSION;  Surgeon: [name], MD;  Location: [place];  Service: Cardiovascular;  Laterality: N/A;  . excision of actinic keratosis    . EYE SURGERY     eye lift  . IR CT HEAD LTD  01/27/2020  . IR  PERCUTANEOUS ART THROMBECTOMY/INFUSION INTRACRANIAL INC DIAG ANGIO  01/27/2020      . IR PERCUTANEOUS ART THROMBECTOMY/INFUSION INTRACRANIAL INC DIAG ANGIO  01/27/2020  . RADIOLOGY WITH ANESTHESIA N/A 01/27/2020   Procedure: IR WITH ANESTHESIA;  Surgeon: Radiologist, Medication, MD;  Location: [place];  Service: Radiology;  Laterality: N/A;  . TEE WITHOUT CARDIOVERSION N/A 03/11/2020   Procedure: TRANSESOPHAGEAL ECHOCARDIOGRAM (TEE);  Surgeon: [name], MD;  Location: [place];  Service: Cardiovascular;  Laterality: N/A;  . TONSILLECTOMY    . WISDOM TOOTH EXTRACTION      There were no vitals filed for this visit.   Subjective Assessment - 11/17/20 0807    Subjective Pt is slowly getting rid of extra equipment in the house. Now walking up the step to get in home.    Pertinent History PMH: HTN, paroxysmal a fib    Limitations Standing;Walking    Patient Stated Goals "wants everything working on R side again"    Currently in Pain? No/denies                             [facility] Adult PT Treatment/Exercise - 11/17/20 0808      Ambulation/Gait   Ambulation/Gait Yes    Ambulation/Gait Assistance 5: Supervision    Ambulation/Gait Assistance Details after BWS walked overground with verbal and tactile cues at pelvis to increase right hip flexion.    Ambulation Distance (Feet) 230  Feet    Assistive device Straight cane   with 6 prong tip and right AFO   Gait Pattern Step-through pattern;Decreased hip/knee flexion - right    Ambulation Surface Level;Indoor    Gait Comments BWS over treadmill x 11 min at 0.63mh then 0.9 mph halfway through. Unweighted about 30#. PT assisted to clear RLE facilitating hip/knee flexion mod assist. BP prior=108/78 then 108/78 after. Pt denied any significant fatigue after.      Neuro Re-ed    Neuro Re-ed Details  Stepping over 3 2x4" bolsters on ground with cane leading with RLE x 6 bouts. Tactile cues at pelvis. Pt performd once with RLE trailing  and had more trouble clearing foot over bolster.                    PT Short Term Goals - 11/14/20 1158      PT SHORT TERM GOAL #1   Title Pt will decrease TUG with cane from 18.52 sec to < 15 sec for improved balance and functional mobility.    Baseline 10/20/20 18.52 sec; 18.45 on 11/14/20    Time 4    Period Weeks    Status Not Met    Target Date 11/19/20      PT SHORT TERM GOAL #2   Title Pt will ambulate with cane with quad tip >600' on level surfaces supervision for improved household and short community distances.    Time 4    Period Weeks    Status New    Target Date 11/19/20      PT SHORT TERM GOAL #3   Title Pt will increase gait speed from 0.538m with cane to >0.6470mfor improved mobility.    Baseline 10/20/20 0.16m39mith cane; 11/14/20; 20.63 seconds = .48 m/s    Time 4    Period Weeks    Status Not Met    Target Date 11/19/20             PT Long Term Goals - 10/20/20 1343      PT LONG TERM GOAL #1   Title Pt will ambulate >800' on varied surfaces with cane supervision for improved community mobility.    Time 8    Period Weeks    Status New    Target Date 12/19/20      PT LONG TERM GOAL #2   Title Pt will increase gait speed with cane to >0.70m/41mr improved community mobility.    Baseline 10/20/20 0.16m/s23mTime 8    Period Weeks    Status New    Target Date 12/19/20      PT LONG TERM GOAL #3   Title Pt will increase Berg from 45 to >48/56 for improved balance and decreased fall risk.    Baseline 08/26/20 38/56. 10/20/20 45/56    Time 8    Period Weeks    Status Revised    Target Date 12/19/20      PT LONG TERM GOAL #4   Title Pt will ambulate up/down 2 steps with LRAD supervision for improved access in home to living room    Baseline when performing curb with cane needs min guard/min A at times for R foot clearance, 4 steps with cane min guard ascending, min A descending    Time 8    Period Weeks    Status On-going    Target Date  12/19/20  Plan - 11/17/20 1304    Clinical Impression Statement Pt tolerated increase in speed and time in BWS over treadmill today well. Pt was able to demonstrate improvement in right hip flexion after overground.    Personal Factors and Comorbidities Comorbidity 3+    Comorbidities dense L MCA CVA, paroxysmal A. fib on chronic Xarelto, nonischemic cardiomyopathy and hypertension.    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 continue with BWS try to help facilitate more right hip/knee flexion. continue R hip flexion strengthening.  stair and curb training with cane.  RLE NMR with decr UE support, balance on compliant surfaces. SciFit for endurane/strengthening    PT Home Exercise Plan on 06/28/20 - added standing at countetop and stepping LLE out and in    Consulted and Agree with Plan of Care Patient;Family member/caregiver    Family Member Consulted [name]           Patient will benefit from skilled therapeutic intervention in order to improve the following deficits and impairments:  Abnormal gait,Decreased activity tolerance,Decreased balance,Decreased cognition,Decreased mobility,Decreased  coordination,Decreased range of motion,Decreased endurance,Decreased strength,Hypomobility,Difficulty walking,Impaired UE functional use,Impaired vision/preception,Postural dysfunction,Impaired sensation  Visit Diagnosis: Other abnormalities of gait and mobility  Muscle weakness (generalized)     Problem List Patient Active Problem List   Diagnosis Date Noted  . Dysarthria, post-stroke 11/03/2020  . Benign essential HTN 06/09/2020  . Abnormality of gait 04/28/2020  . Neurogenic bladder 03/17/2020  . Sepsis due to pneumonia ([place]) 03/06/2020  . History of stroke with residual deficit 03/06/2020  . Transient hypotension 03/06/2020  . Spastic hemiplegia affecting nondominant side ([place])   . History of hypertension   . Expressive aphasia   . AKI (acute kidney injury) ([place])   . Global aphasia   . Hypoalbuminemia due to protein-calorie malnutrition ([place])   . Dysphagia, post-stroke   . Hyperlipidemia 02/02/2020  . Dysphagia following cerebral infarction 02/02/2020  . Aspiration pneumonia ([place]) 02/02/2020  . Acute ischemic left MCA stroke ([place]) 02/02/2020  . Acute respiratory failure ([place])   . Acute ischemic stroke [facility]) s/p clot retrieval L MCA & ACA A3 01/27/2020  . Aspiration into airway 01/27/2020  . Chronic anticoagulation 01/27/2020  . Paroxysmal atrial fibrillation ([place]) 06/15/2018  . Essential hypertension 06/15/2018  . NICM (nonischemic cardiomyopathy) ([place]) 06/15/2018  . Visit for monitoring Tikosyn therapy 06/12/2018    Electa Sniff, PT, DPT, NCS 11/17/2020, 1:05 PM  [facility] [address] [place], [place], [address] Phone: [phone]   Fax:  [phone]  Name: ABSALOM ARO MRN: [id number] Date of Birth: 03-17-1956

## 2020-11-21 ENCOUNTER — Ambulatory Visit: Payer: BC Managed Care – PPO | Admitting: Physical Therapy

## 2020-11-21 ENCOUNTER — Other Ambulatory Visit: Payer: Self-pay

## 2020-11-21 ENCOUNTER — Encounter: Payer: Self-pay | Admitting: Physical Therapy

## 2020-11-21 DIAGNOSIS — R2689 Other abnormalities of gait and mobility: Secondary | ICD-10-CM

## 2020-11-21 DIAGNOSIS — M6281 Muscle weakness (generalized): Secondary | ICD-10-CM | POA: Diagnosis not present

## 2020-11-21 DIAGNOSIS — R2681 Unsteadiness on feet: Secondary | ICD-10-CM

## 2020-11-21 DIAGNOSIS — R29818 Other symptoms and signs involving the nervous system: Secondary | ICD-10-CM

## 2020-11-21 NOTE — Therapy (Signed)
[facility] [address] [place], [place], [address] Phone: [phone]   Fax:  [phone]  Physical Therapy Treatment  Patient Details  Name: Dustin Schneider MRN: [id number] Date of Birth: [birth year] Referring Provider (PT): Lauraine Rinne, PA-C but followed by Delice Lesch   Encounter Date: 11/21/2020   PT End of Session - 11/21/20 2008    Visit Number 46    Number of Visits 38    Authorization Type BCBS    PT Start Time 0086    PT Stop Time 0932    PT Time Calculation (min) 45 min    Equipment Utilized During Treatment Gait belt    Activity Tolerance Patient tolerated treatment well    Behavior During Therapy [facility] for tasks assessed/performed           Past Medical History:  Diagnosis Date  . Hypertension   . NICM (nonischemic cardiomyopathy) ([place]) 06/15/2018   04/23/18: EF 45%, 09/15/18: NOrmal LVEF  . Paroxysmal atrial fibrillation ([place]) 06/15/2018  . Stroke ([place])   . Visit for monitoring Tikosyn therapy 06/12/2018    Past Surgical History:  Procedure Laterality Date  . BUBBLE STUDY  03/11/2020   Procedure: BUBBLE STUDY;  Surgeon: [name], MD;  Location: [facility];  Service: Cardiovascular;;  . CARDIOVERSION N/A 05/13/2018   Procedure: CARDIOVERSION;  Surgeon: [name], MD;  Location: [facility] ENDOSCOPY;  Service: Cardiovascular;  Laterality: N/A;  . CARDIOVERSION N/A 06/13/2018   Procedure: CARDIOVERSION;  Surgeon: [name], MD;  Location: [facility] ENDOSCOPY;  Service: Cardiovascular;  Laterality: N/A;  . CARDIOVERSION N/A 06/23/2018   Procedure: CARDIOVERSION;  Surgeon: [name], MD;  Location: [facility] ENDOSCOPY;  Service: Cardiovascular;  Laterality: N/A;  . CARDIOVERSION N/A 03/11/2020   Procedure: CARDIOVERSION;  Surgeon: [name], MD;  Location: [place];  Service: Cardiovascular;  Laterality: N/A;  . excision of actinic keratosis    . EYE SURGERY     eye lift  . IR CT HEAD LTD  01/27/2020  .  IR PERCUTANEOUS ART THROMBECTOMY/INFUSION INTRACRANIAL INC DIAG ANGIO  01/27/2020      . IR PERCUTANEOUS ART THROMBECTOMY/INFUSION INTRACRANIAL INC DIAG ANGIO  01/27/2020  . RADIOLOGY WITH ANESTHESIA N/A 01/27/2020   Procedure: IR WITH ANESTHESIA;  Surgeon: Radiologist, Medication, MD;  Location: [place];  Service: Radiology;  Laterality: N/A;  . TEE WITHOUT CARDIOVERSION N/A 03/11/2020   Procedure: TRANSESOPHAGEAL ECHOCARDIOGRAM (TEE);  Surgeon: [name], MD;  Location: [place];  Service: Cardiovascular;  Laterality: N/A;  . TONSILLECTOMY    . WISDOM TOOTH EXTRACTION      There were no vitals filed for this visit.   Subjective Assessment - 11/21/20 0851    Subjective No changes, doing good.    Pertinent History PMH: HTN, paroxysmal a fib    Limitations Standing;Walking    Patient Stated Goals "wants everything working on R side again"    Currently in Pain? No/denies                             [facility] Adult PT Treatment/Exercise - 11/21/20 2011      Ambulation/Gait   Ambulation/Gait Yes    Ambulation/Gait Assistance 5: Supervision    Ambulation/Gait Assistance Details after BWS, performed additional laps of gait with use of blue theraband around RLE to help facilitate foot clearance and R hip flexion, additional tactile cues provided from therapist    Ambulation Distance (Feet) 345 Feet  Assistive device Straight cane   with 6 prong tip   Gait Pattern Step-through pattern;Decreased hip/knee flexion - right    Ambulation Surface Level;Indoor    Gait Comments BWS over treadmill x3 minutes (BWS did not turn on today, tried to [employer] pt using rope and bar pole attached to pt's harness) - with therapist helping with RLE foot clearance and 2nd therapist helping provide tactile cues to R pelvis. Performed an additional x8 minutes after donning blue theraband around RLE to assist with R hip flexion with therapist assisting with RLE hip/knee flexion (providing less assist  with pt having improved foot clearance) and 2nd therapist helping maintain theraband in place                    PT Short Term Goals - 11/14/20 1158      PT SHORT TERM GOAL #1   Title Pt will decrease TUG with cane from 18.52 sec to < 15 sec for improved balance and functional mobility.    Baseline 10/20/20 18.52 sec; 18.45 on 11/14/20    Time 4    Period Weeks    Status Not Met    Target Date 11/19/20      PT SHORT TERM GOAL #2   Title Pt will ambulate with cane with quad tip >600' on level surfaces supervision for improved household and short community distances.    Time 4    Period Weeks    Status New    Target Date 11/19/20      PT SHORT TERM GOAL #3   Title Pt will increase gait speed from 0.32ms with cane to >0.626m for improved mobility.    Baseline 10/20/20 0.5470mwith cane; 11/14/20; 20.63 seconds = .48 m/s    Time 4    Period Weeks    Status Not Met    Target Date 11/19/20             PT Long Term Goals - 10/20/20 1343      PT LONG TERM GOAL #1   Title Pt will ambulate >800' on varied surfaces with cane supervision for improved community mobility.    Time 8    Period Weeks    Status New    Target Date 12/19/20      PT LONG TERM GOAL #2   Title Pt will increase gait speed with cane to >0.1m44mor improved community mobility.    Baseline 10/20/20 0.50m/50m Time 8    Period Weeks    Status New    Target Date 12/19/20      PT LONG TERM GOAL #3   Title Pt will increase Berg from 45 to >48/56 for improved balance and decreased fall risk.    Baseline 08/26/20 38/56. 10/20/20 45/56    Time 8    Period Weeks    Status Revised    Target Date 12/19/20      PT LONG TERM GOAL #4   Title Pt will ambulate up/down 2 steps with LRAD supervision for improved access in home to living room    Baseline when performing curb with cane needs min guard/min A at times for R foot clearance, 4 steps with cane min guard ascending, min A descending    Time 8    Period  Weeks    Status On-going    Target Date 12/19/20                 Plan - 11/21/20 2010  Clinical Impression Statement Utilized blue theraband to assist with foot clearance and R hip flexion during gait training over treadmill and during 2 laps of gait over ground. When removed, pt demonstrating an improvement in R hip flexion. Pt fatigued at end of session.    Personal Factors and Comorbidities Comorbidity 3+    Comorbidities dense L MCA CVA, paroxysmal A. fib on chronic Xarelto, nonischemic cardiomyopathy and hypertension.    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 continue with BWS try to help facilitate more right hip/knee flexion. continue R hip flexion strengthening.  try bioness for hamstring activation during gait? stair and curb training with cane.    PT Home Exercise Plan on 06/28/20 - added standing at countetop and stepping LLE out and in    Consulted and Agree with Plan of Care Patient;Family member/caregiver    Family Member Consulted [name]           Patient will benefit from skilled therapeutic intervention in order to improve the following deficits and impairments:  Abnormal gait,Decreased activity  tolerance,Decreased balance,Decreased cognition,Decreased mobility,Decreased coordination,Decreased range of motion,Decreased endurance,Decreased strength,Hypomobility,Difficulty walking,Impaired UE functional use,Impaired vision/preception,Postural dysfunction,Impaired sensation  Visit Diagnosis: Other abnormalities of gait and mobility  Muscle weakness (generalized)  Unsteadiness on feet  Other symptoms and signs involving the nervous system     Problem List Patient Active Problem List   Diagnosis Date Noted  . Dysarthria, post-stroke 11/03/2020  . Benign essential HTN 06/09/2020  . Abnormality of gait 04/28/2020  . Neurogenic bladder 03/17/2020  . Sepsis due to pneumonia ([place]) 03/06/2020  . History of stroke with residual deficit 03/06/2020  . Transient hypotension 03/06/2020  . Spastic hemiplegia affecting nondominant side ([place])   . History of hypertension   . Expressive aphasia   . AKI (acute kidney injury) ([place])   . Global aphasia   . Hypoalbuminemia due to protein-calorie malnutrition ([place])   . Dysphagia, post-stroke   . Hyperlipidemia 02/02/2020  . Dysphagia following cerebral infarction 02/02/2020  . Aspiration pneumonia ([place]) 02/02/2020  . Acute ischemic left MCA stroke ([place]) 02/02/2020  . Acute respiratory failure ([place])   . Acute ischemic stroke [facility]) s/p clot retrieval L MCA & ACA A3 01/27/2020  . Aspiration into airway 01/27/2020  . Chronic anticoagulation 01/27/2020  . Paroxysmal atrial fibrillation ([place]) 06/15/2018  . Essential hypertension 06/15/2018  . NICM (nonischemic cardiomyopathy) ([place]) 06/15/2018  . Visit for monitoring Tikosyn therapy 06/12/2018    Arliss Journey , PT, DPT  11/21/2020, 8:16 PM  [facility] [address] [place], [place], [address] Phone: [phone]   Fax:  [phone]  Name: ILIJA MAXIM MRN: [id number] Date of Birth: 1956-06-03

## 2020-11-24 ENCOUNTER — Ambulatory Visit: Payer: BC Managed Care – PPO | Admitting: Physical Therapy

## 2020-11-24 ENCOUNTER — Encounter: Payer: Self-pay | Admitting: Physical Therapy

## 2020-11-24 ENCOUNTER — Other Ambulatory Visit: Payer: Self-pay

## 2020-11-24 DIAGNOSIS — R2681 Unsteadiness on feet: Secondary | ICD-10-CM

## 2020-11-24 DIAGNOSIS — M6281 Muscle weakness (generalized): Secondary | ICD-10-CM

## 2020-11-24 DIAGNOSIS — R29818 Other symptoms and signs involving the nervous system: Secondary | ICD-10-CM

## 2020-11-24 DIAGNOSIS — R2689 Other abnormalities of gait and mobility: Secondary | ICD-10-CM

## 2020-11-24 NOTE — Therapy (Signed)
[facility] [address] [place], [place], [address] Phone: [phone]   Fax:  [phone]  Physical Therapy Treatment  Patient Details  Name: Dustin Schneider MRN: [id number] Date of Birth: December 09, 1955 Referring Provider (PT): Lauraine Rinne, PA-C but followed by Delice Lesch   Encounter Date: 11/24/2020   PT End of Session - 11/24/20 1331    Visit Number 37    Number of Visits 52    Authorization Type BCBS    PT Start Time 0931    PT Stop Time 1013    PT Time Calculation (min) 42 min    Equipment Utilized During Treatment Gait belt    Activity Tolerance Patient tolerated treatment well    Behavior During Therapy [facility] for tasks assessed/performed           Past Medical History:  Diagnosis Date  . Hypertension   . NICM (nonischemic cardiomyopathy) ([place]) 06/15/2018   04/23/18: EF 45%, 09/15/18: NOrmal LVEF  . Paroxysmal atrial fibrillation ([place]) 06/15/2018  . Stroke ([place])   . Visit for monitoring Tikosyn therapy 06/12/2018    Past Surgical History:  Procedure Laterality Date  . BUBBLE STUDY  03/11/2020   Procedure: BUBBLE STUDY;  Surgeon: [name], MD;  Location: [place];  Service: Cardiovascular;;  . CARDIOVERSION N/A 05/13/2018   Procedure: CARDIOVERSION;  Surgeon: [name], MD;  Location: [facility] ENDOSCOPY;  Service: Cardiovascular;  Laterality: N/A;  . CARDIOVERSION N/A 06/13/2018   Procedure: CARDIOVERSION;  Surgeon: [name], MD;  Location: [facility] ENDOSCOPY;  Service: Cardiovascular;  Laterality: N/A;  . CARDIOVERSION N/A 06/23/2018   Procedure: CARDIOVERSION;  Surgeon: [name], MD;  Location: [facility] ENDOSCOPY;  Service: Cardiovascular;  Laterality: N/A;  . CARDIOVERSION N/A 03/11/2020   Procedure: CARDIOVERSION;  Surgeon: [name], MD;  Location: [place];  Service: Cardiovascular;  Laterality: N/A;  . excision of actinic keratosis    . EYE SURGERY     eye lift  . IR CT HEAD LTD  01/27/2020  .  IR PERCUTANEOUS ART THROMBECTOMY/INFUSION INTRACRANIAL INC DIAG ANGIO  01/27/2020      . IR PERCUTANEOUS ART THROMBECTOMY/INFUSION INTRACRANIAL INC DIAG ANGIO  01/27/2020  . RADIOLOGY WITH ANESTHESIA N/A 01/27/2020   Procedure: IR WITH ANESTHESIA;  Surgeon: Radiologist, Medication, MD;  Location: [place];  Service: Radiology;  Laterality: N/A;  . TEE WITHOUT CARDIOVERSION N/A 03/11/2020   Procedure: TRANSESOPHAGEAL ECHOCARDIOGRAM (TEE);  Surgeon: [name], MD;  Location: [facility] [place];  Service: Cardiovascular;  Laterality: N/A;  . TONSILLECTOMY    . WISDOM TOOTH EXTRACTION      There were no vitals filed for this visit.   Subjective Assessment - 11/24/20 0934    Subjective Wearing his velcro shoes today. Dropped other sneakers off to get the toe cap fix. No longer using his RW.    Pertinent History PMH: HTN, paroxysmal a fib    Limitations Standing;Walking    Patient Stated Goals "wants everything working on R side again"    Currently in Pain? No/denies                             [name] Adult PT Treatment/Exercise - 11/24/20 0959      Ambulation/Gait   Ambulation/Gait Yes    Ambulation/Gait Assistance 5: Supervision    Assistive device Straight cane   with 6 prong tip   Gait Pattern Step-through pattern;Decreased hip/knee flexion - right    Ambulation Surface Level;Indoor  Gait Comments BWS support over treadmill, beginning at .9 mph and then 1.0 mph, unweighted approx. #30 for 11 minutes total. Therapist providing mod A for RLE foot clearance for incr hip/knee flexion. Utilized blue theraband around foot for ankle DF, posterior knee, and then tied at anterior ASIS around gailt belt to facilitate R hip flexion, PT tech held onto theraband to prevent it slipping throughout and keep it in place.      Neuro Re-ed    Neuro Re-ed Details  with use of SPC with 6 prong tip stepping over 4 obstacles of 2" in height down and back x3 reps - leading with RLE for hip flexion and  foot clearance with tactile cues at pelvis, sit <> stands with blue tband resistance at pelvis (PT tech providing it posteriorly), x4 reps on level ground and x4 reps on blue foam (min guard) - cues for full hip ext                    PT Short Term Goals - 11/14/20 1158      PT SHORT TERM GOAL #1   Title Pt will decrease TUG with cane from 18.52 sec to < 15 sec for improved balance and functional mobility.    Baseline 10/20/20 18.52 sec; 18.45 on 11/14/20    Time 4    Period Weeks    Status Not Met    Target Date 11/19/20      PT SHORT TERM GOAL #2   Title Pt will ambulate with cane with quad tip >600' on level surfaces supervision for improved household and short community distances.    Time 4    Period Weeks    Status New    Target Date 11/19/20      PT SHORT TERM GOAL #3   Title Pt will increase gait speed from 0.57ms with cane to >0.667m for improved mobility.    Baseline 10/20/20 0.5445mwith cane; 11/14/20; 20.63 seconds = .48 m/s    Time 4    Period Weeks    Status Not Met    Target Date 11/19/20             PT Long Term Goals - 10/20/20 1343      PT LONG TERM GOAL #1   Title Pt will ambulate >800' on varied surfaces with cane supervision for improved community mobility.    Time 8    Period Weeks    Status New    Target Date 12/19/20      PT LONG TERM GOAL #2   Title Pt will increase gait speed with cane to >0.57m25mor improved community mobility.    Baseline 10/20/20 0.45m/77m Time 8    Period Weeks    Status New    Target Date 12/19/20      PT LONG TERM GOAL #3   Title Pt will increase Berg from 45 to >48/56 for improved balance and decreased fall risk.    Baseline 08/26/20 38/56. 10/20/20 45/56    Time 8    Period Weeks    Status Revised    Target Date 12/19/20      PT LONG TERM GOAL #4   Title Pt will ambulate up/down 2 steps with LRAD supervision for improved access in home to living room    Baseline when performing curb with cane needs  min guard/min A at times for R foot clearance, 4 steps with cane min guard ascending, min A descending  Time 8    Period Weeks    Status On-going    Target Date 12/19/20                 Plan - 11/24/20 1338    Clinical Impression Statement Continued to use blue theraband to RLE to help facilitate R hip flexion and foot clearance during BWS treadmill training today. Able to incr speed today on treadmill to 1.0 mph, with no issues. Pt demonstrating improved RLE hip flexion after gait training.    Personal Factors and Comorbidities Comorbidity 3+    Comorbidities dense L MCA CVA, paroxysmal A. fib on chronic Xarelto, nonischemic cardiomyopathy and hypertension.    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 continue with BWS try to help facilitate more right hip/knee flexion. continue R hip flexion strengthening. gait outdoors with cane. stair and curb training with cane.    PT Home Exercise Plan on 06/28/20 - added standing at countetop and stepping LLE out and in    Consulted and Agree with Plan of Care Patient    Family Member Consulted --           Patient will benefit from skilled therapeutic intervention in order to improve  the following deficits and impairments:  Abnormal gait,Decreased activity tolerance,Decreased balance,Decreased cognition,Decreased mobility,Decreased coordination,Decreased range of motion,Decreased endurance,Decreased strength,Hypomobility,Difficulty walking,Impaired UE functional use,Impaired vision/preception,Postural dysfunction,Impaired sensation  Visit Diagnosis: Other abnormalities of gait and mobility  Muscle weakness (generalized)  Unsteadiness on feet  Other symptoms and signs involving the nervous system     Problem List Patient Active Problem List   Diagnosis Date Noted  . Dysarthria, post-stroke 11/03/2020  . Benign essential HTN 06/09/2020  . Abnormality of gait 04/28/2020  . Neurogenic bladder 03/17/2020  . Sepsis due to pneumonia ([place]) 03/06/2020  . History of stroke with residual deficit 03/06/2020  . Transient hypotension 03/06/2020  . Spastic hemiplegia affecting nondominant side ([place])   . History of hypertension   . Expressive aphasia   . AKI (acute kidney injury) ([place])   . Global aphasia   . Hypoalbuminemia due to protein-calorie malnutrition ([place])   . Dysphagia, post-stroke   . Hyperlipidemia 02/02/2020  . Dysphagia following cerebral infarction 02/02/2020  . Aspiration pneumonia ([place]) 02/02/2020  . Acute ischemic left MCA stroke ([place]) 02/02/2020  . Acute respiratory failure ([place])   . Acute ischemic stroke [facility]) s/p clot retrieval L MCA & ACA A3 01/27/2020  . Aspiration into airway 01/27/2020  . Chronic anticoagulation 01/27/2020  . Paroxysmal atrial fibrillation ([place]) 06/15/2018  . Essential hypertension 06/15/2018  . NICM (nonischemic cardiomyopathy) ([place]) 06/15/2018  . Visit for monitoring Tikosyn therapy 06/12/2018    Arliss Journey, PT, DPT  11/24/2020, 1:39 PM  [facility] [address] [place], [place], [address] Phone: [phone]   Fax:  [phone]  Name: Dustin Schneider MRN: [id number] Date of Birth: 1955/09/04

## 2020-11-28 ENCOUNTER — Ambulatory Visit: Payer: BC Managed Care – PPO | Admitting: Physical Therapy

## 2020-12-01 ENCOUNTER — Ambulatory Visit: Payer: BC Managed Care – PPO | Admitting: Physical Therapy

## 2020-12-01 ENCOUNTER — Encounter: Payer: Self-pay | Admitting: Physical Therapy

## 2020-12-01 ENCOUNTER — Other Ambulatory Visit: Payer: Self-pay

## 2020-12-01 DIAGNOSIS — R29818 Other symptoms and signs involving the nervous system: Secondary | ICD-10-CM

## 2020-12-01 DIAGNOSIS — M6281 Muscle weakness (generalized): Secondary | ICD-10-CM

## 2020-12-01 DIAGNOSIS — R2681 Unsteadiness on feet: Secondary | ICD-10-CM

## 2020-12-01 DIAGNOSIS — R2689 Other abnormalities of gait and mobility: Secondary | ICD-10-CM

## 2020-12-02 NOTE — Therapy (Signed)
[facility] [address] [place], [place], [address] Phone: [phone]   Fax:  [phone]  Physical Therapy Treatment  Patient Details  Name: Dustin Schneider MRN: [id number] Date of Birth: 1956/05/09 Referring Provider (PT): Lauraine Rinne, PA-C but followed by Delice Lesch   Encounter Date: 12/01/2020   PT End of Session - 12/02/20 1051    Visit Number 45    Number of Visits 61    Authorization Type BCBS    PT Start Time 0847    PT Stop Time 0930    PT Time Calculation (min) 43 min    Equipment Utilized During Treatment Gait belt    Activity Tolerance Patient tolerated treatment well    Behavior During Therapy [facility] for tasks assessed/performed           Past Medical History:  Diagnosis Date  . Hypertension   . NICM (nonischemic cardiomyopathy) ([place]) 06/15/2018   04/23/18: EF 45%, 09/15/18: NOrmal LVEF  . Paroxysmal atrial fibrillation ([place]) 06/15/2018  . Stroke ([place])   . Visit for monitoring Tikosyn therapy 06/12/2018    Past Surgical History:  Procedure Laterality Date  . BUBBLE STUDY  03/11/2020   Procedure: BUBBLE STUDY;  Surgeon: [name], MD;  Location: [facility];  Service: Cardiovascular;;  . CARDIOVERSION N/A 05/13/2018   Procedure: CARDIOVERSION;  Surgeon: [name], MD;  Location: [facility] ENDOSCOPY;  Service: Cardiovascular;  Laterality: N/A;  . CARDIOVERSION N/A 06/13/2018   Procedure: CARDIOVERSION;  Surgeon: [name], MD;  Location: [facility] ENDOSCOPY;  Service: Cardiovascular;  Laterality: N/A;  . CARDIOVERSION N/A 06/23/2018   Procedure: CARDIOVERSION;  Surgeon: [name], MD;  Location: [facility] ENDOSCOPY;  Service: Cardiovascular;  Laterality: N/A;  . CARDIOVERSION N/A 03/11/2020   Procedure: CARDIOVERSION;  Surgeon: [name], MD;  Location: [facility];  Service: Cardiovascular;  Laterality: N/A;  . excision of actinic keratosis    . EYE SURGERY     eye lift  . IR CT HEAD LTD  01/27/2020  .  IR PERCUTANEOUS ART THROMBECTOMY/INFUSION INTRACRANIAL INC DIAG ANGIO  01/27/2020      . IR PERCUTANEOUS ART THROMBECTOMY/INFUSION INTRACRANIAL INC DIAG ANGIO  01/27/2020  . RADIOLOGY WITH ANESTHESIA N/A 01/27/2020   Procedure: IR WITH ANESTHESIA;  Surgeon: Radiologist, Medication, MD;  Location: [facility];  Service: Radiology;  Laterality: N/A;  . TEE WITHOUT CARDIOVERSION N/A 03/11/2020   Procedure: TRANSESOPHAGEAL ECHOCARDIOGRAM (TEE);  Surgeon: [name], MD;  Location: [facility];  Service: Cardiovascular;  Laterality: N/A;  . TONSILLECTOMY    . WISDOM TOOTH EXTRACTION      There were no vitals filed for this visit.   Subjective Assessment - 12/01/20 0851    Subjective Got his new toe cap on his shoes. Having a little bit of back pain  - started yesterday, not sure what caused it. Doesn't feel the pain until he bends over.    Pertinent History PMH: HTN, paroxysmal a fib    Limitations Standing;Walking    Patient Stated Goals "wants everything working on R side again"    Currently in Pain? No/denies                             [facility] Adult PT Treatment/Exercise - 12/02/20 1054      Ambulation/Gait   Ambulation/Gait Yes    Ambulation/Gait Assistance 5: Supervision;4: Min assist    Ambulation/Gait Assistance Details supervision over paved surfaces outside, needing min A when  attempting gait on grass for RLE foot clearance due to R toe getting stuck    Ambulation Distance (Feet) 200 Feet    Assistive device Straight cane   with 6 prong tip   Gait Pattern Step-through pattern;Decreased hip/knee flexion - right    Ambulation Surface Level;Indoor    Stairs Yes    Stairs Assistance 5: Supervision;4: Min assist    Stairs Assistance Details (indicate cue type and reason) first 2 reps with use of cane and step to pattern, last rep attempting step through pattern with single UE handrail support, therapist providing min A for R hip/knee flexion when stepping due to incr tone     Stair Management Technique Step to pattern;One rail Left;Alternating pattern    Number of Stairs 12    Height of Stairs 6    Curb 5: Supervision    Curb Details (indicate cue type and reason) x3 reps outdoors with [facility] with 6 prong tip    Gait Comments BWS support over treadmill at 1.0 mph unweighted approx. #30 for 5 minutes total. Therapist providing min/mod A for RLE foot clearance for incr hip/knee flexion.      Exercises   Exercises Other Exercises    Other Exercises  performed lower trunk rotations x10 reps with pt reporting relief, attempted single knee to chest stretch (no relief in sx), performed supine hooklying piriformis stretch with LLE (therapist helping pt get in this position 2 x 30 seconds) with pt reporting relief, showed pt's caregiver how to get pt set up in this position for home.               Balance Exercises - 12/02/20 0001      Balance Exercises: Standing   Rockerboard Anterior/posterior;EO    Rockerboard Limitations keeping RLE on board and stepping LLE on/off x12 reps - beginning with UE support and progressing to none with min A             PT Education - 12/02/20 1051    Education Details low back/hip stretches to perform at home due to recent low back pain    Person(s) Educated Patient    Methods Explanation;Demonstration    Comprehension Verbalized understanding;Returned demonstration            PT Short Term Goals - 11/14/20 1158      PT SHORT TERM GOAL #1   Title Pt will decrease TUG with cane from 18.52 sec to < 15 sec for improved balance and functional mobility.    Baseline 10/20/20 18.52 sec; 18.45 on 11/14/20    Time 4    Period Weeks    Status Not Met    Target Date 11/19/20      PT SHORT TERM GOAL #2   Title Pt will ambulate with cane with quad tip >600' on level surfaces supervision for improved household and short community distances.    Time 4    Period Weeks    Status New    Target Date 11/19/20      PT SHORT TERM GOAL  #3   Title Pt will increase gait speed from 0.30ms with cane to >0.660m for improved mobility.    Baseline 10/20/20 0.5468mwith cane; 11/14/20; 20.63 seconds = .48 m/s    Time 4    Period Weeks    Status Not Met    Target Date 11/19/20             PT Long Term Goals - 10/20/20 1343  PT LONG TERM GOAL #1   Title Pt will ambulate >800' on varied surfaces with cane supervision for improved community mobility.    Time 8    Period Weeks    Status New    Target Date 12/19/20      PT LONG TERM GOAL #2   Title Pt will increase gait speed with cane to >0.22ms for improved community mobility.    Baseline 10/20/20 0.569m    Time 8    Period Weeks    Status New    Target Date 12/19/20      PT LONG TERM GOAL #3   Title Pt will increase Berg from 45 to >48/56 for improved balance and decreased fall risk.    Baseline 08/26/20 38/56. 10/20/20 45/56    Time 8    Period Weeks    Status Revised    Target Date 12/19/20      PT LONG TERM GOAL #4   Title Pt will ambulate up/down 2 steps with LRAD supervision for improved access in home to living room    Baseline when performing curb with cane needs min guard/min A at times for R foot clearance, 4 steps with cane min guard ascending, min A descending    Time 8    Period Weeks    Status On-going    Target Date 12/19/20                 Plan - 12/02/20 1052    Clinical Impression Statement Continued to use BWS treadmill training with pt unweighted approx. 30# for improved hip/knee flexion during gait with therapist providing min/mod A to assist. [facility], pt needing supervision on pavement, trialed walking on grass with pt needing min A for balance. Also gave pt low back/hip stretches due to recent low back pain, with pt reporting relief afterwards. Will continue to progress towards LTGs.    Personal Factors and Comorbidities Comorbidity 3+    Comorbidities dense L MCA CVA, paroxysmal A. fib on  chronic Xarelto, nonischemic cardiomyopathy and hypertension.    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  continue with BWS try to help facilitate more right hip/knee flexion. continue R hip flexion strengthening. gait outdoors with cane.    PT Home Exercise Plan on 06/28/20 - added standing at countetop and stepping LLE out and in    Consulted and Agree with Plan of Care Patient           Patient will benefit from skilled therapeutic intervention in order to improve the following deficits and impairments:  Abnormal gait,Decreased activity tolerance,Decreased balance,Decreased cognition,Decreased mobility,Decreased coordination,Decreased range of motion,Decreased endurance,Decreased strength,Hypomobility,Difficulty walking,Impaired UE functional use,Impaired vision/preception,Postural dysfunction,Impaired sensation  Visit Diagnosis: Other abnormalities of gait and mobility  Muscle weakness (generalized)  Unsteadiness on feet  Other symptoms and signs involving the nervous system     Problem List Patient Active Problem List   Diagnosis Date Noted  . Dysarthria, post-stroke 11/03/2020  . Benign essential  HTN 06/09/2020  .  Abnormality of gait 04/28/2020  . Neurogenic bladder 03/17/2020  . Sepsis due to pneumonia ([place]) 03/06/2020  . History of stroke with residual deficit 03/06/2020  . Transient hypotension 03/06/2020  . Spastic hemiplegia affecting nondominant side ([place])   . History of hypertension   . Expressive aphasia   . AKI (acute kidney injury) ([place])   . Global aphasia   . Hypoalbuminemia due to protein-calorie malnutrition ([place])   . Dysphagia, post-stroke   . Hyperlipidemia 02/02/2020  . Dysphagia following cerebral infarction 02/02/2020  . Aspiration pneumonia ([place]) 02/02/2020  . Acute ischemic left MCA stroke ([place]) 02/02/2020  . Acute respiratory failure ([place])   . Acute ischemic stroke [facility]) s/p clot retrieval L MCA & ACA A3 01/27/2020  . Aspiration into airway 01/27/2020  . Chronic anticoagulation 01/27/2020  . Paroxysmal atrial fibrillation ([place]) 06/15/2018  . Essential hypertension 06/15/2018  . NICM (nonischemic cardiomyopathy) ([place]) 06/15/2018  . Visit for monitoring Tikosyn therapy 06/12/2018    Arliss Journey , PT, DPT  12/02/2020, 10:59 AM  [facility] [address] [place], [place], [address] Phone: [phone]   Fax:  [phone]  Name: Dustin Schneider MRN: [id number] Date of Birth: Sep 25, 1955

## 2020-12-05 ENCOUNTER — Encounter: Payer: Self-pay | Admitting: Physical Therapy

## 2020-12-05 ENCOUNTER — Ambulatory Visit: Payer: BC Managed Care – PPO | Admitting: Physical Therapy

## 2020-12-05 ENCOUNTER — Other Ambulatory Visit: Payer: Self-pay

## 2020-12-05 DIAGNOSIS — R29818 Other symptoms and signs involving the nervous system: Secondary | ICD-10-CM

## 2020-12-05 DIAGNOSIS — R2681 Unsteadiness on feet: Secondary | ICD-10-CM

## 2020-12-05 DIAGNOSIS — M6281 Muscle weakness (generalized): Secondary | ICD-10-CM

## 2020-12-05 DIAGNOSIS — R2689 Other abnormalities of gait and mobility: Secondary | ICD-10-CM

## 2020-12-05 NOTE — Therapy (Addendum)
[facility] [address] [place], [place], [address] Phone: [phone]   Fax:  [phone]  Physical Therapy Treatment  Patient Details  Name: Dustin Schneider MRN: [id number] Date of Birth: June 26, 1956 Referring Provider (PT): Lauraine Rinne, PA-C but followed by Delice Lesch   Encounter Date: 12/05/2020   PT End of Session - 12/05/20 1032    Visit Number 37    Number of Visits 25    Authorization Type BCBS    PT Start Time [phone]   pt using restroom at beginning of session   PT Stop Time 0933    PT Time Calculation (min) 42 min    Equipment Utilized During Treatment Gait belt    Activity Tolerance Patient tolerated treatment well    Behavior During Therapy [facility] for tasks assessed/performed           Past Medical History:  Diagnosis Date  . Hypertension   . NICM (nonischemic cardiomyopathy) ([place]) 06/15/2018   04/23/18: EF 45%, 09/15/18: NOrmal LVEF  . Paroxysmal atrial fibrillation ([place]) 06/15/2018  . Stroke ([place])   . Visit for monitoring Tikosyn therapy 06/12/2018    Past Surgical History:  Procedure Laterality Date  . BUBBLE STUDY  03/11/2020   Procedure: BUBBLE STUDY;  Surgeon: [name], MD;  Location: [facility];  Service: Cardiovascular;;  . CARDIOVERSION N/A 05/13/2018   Procedure: CARDIOVERSION;  Surgeon: [name], MD;  Location: [facility] ENDOSCOPY;  Service: Cardiovascular;  Laterality: N/A;  . CARDIOVERSION N/A 06/13/2018   Procedure: CARDIOVERSION;  Surgeon: [name], MD;  Location: [facility] ENDOSCOPY;  Service: Cardiovascular;  Laterality: N/A;  . CARDIOVERSION N/A 06/23/2018   Procedure: CARDIOVERSION;  Surgeon: [name], MD;  Location: [facility] ENDOSCOPY;  Service: Cardiovascular;  Laterality: N/A;  . CARDIOVERSION N/A 03/11/2020   Procedure: CARDIOVERSION;  Surgeon: [name], MD;  Location: [place] [facility];  Service: Cardiovascular;  Laterality: N/A;  . excision of actinic keratosis    . EYE SURGERY      eye lift  . IR CT HEAD LTD  01/27/2020  . IR PERCUTANEOUS ART THROMBECTOMY/INFUSION INTRACRANIAL INC DIAG ANGIO  01/27/2020      . IR PERCUTANEOUS ART THROMBECTOMY/INFUSION INTRACRANIAL INC DIAG ANGIO  01/27/2020  . RADIOLOGY WITH ANESTHESIA N/A 01/27/2020   Procedure: IR WITH ANESTHESIA;  Surgeon: Radiologist, Medication, MD;  Location: [facility];  Service: Radiology;  Laterality: N/A;  . TEE WITHOUT CARDIOVERSION N/A 03/11/2020   Procedure: TRANSESOPHAGEAL ECHOCARDIOGRAM (TEE);  Surgeon: [name], MD;  Location: [facility];  Service: Cardiovascular;  Laterality: N/A;  . TONSILLECTOMY    . WISDOM TOOTH EXTRACTION      There were no vitals filed for this visit.   Subjective Assessment - 12/05/20 0854    Subjective Had a fall Saturday night, was going up the stairs and didn't place his foot properly (per Dustin Schneider, pt was going a little too fast). Did not hurt himself. Dustin Schneider was able to help him get up. Had a little bit of soreness in his right hip. Back is feeling better.    Patient is accompained by: --   gf, Dustin Schneider   Pertinent History PMH: HTN, paroxysmal a fib    Limitations Standing;Walking    Patient Stated Goals "wants everything working on R side again"    Currently in Pain? No/denies  12/06/20 1736  Ambulation/Gait  Ambulation/Gait Yes  Ambulation/Gait Assistance 5: Supervision  Ambulation/Gait Assistance Details over level indoor surfaces in therapy gym  Ambulation Distance (Feet) 115 Feet  Assistive device Straight cane (with 6 prong tip)  Gait Pattern Step-through pattern;Decreased hip/knee flexion - right  Ambulation Surface Level;Indoor  Stairs Yes  Stairs Assistance 5: Supervision;4: Min guard  Stairs Assistance Details (indicate cue type and reason) cues for slowed pace and to focus on RLE foot placement  Stair Management Technique Step to pattern;With cane  Number of Stairs 12  Height of Stairs 6  Therapeutic Activites    Therapeutic Activities Other Therapeutic Activities  Other Therapeutic Activities pt had a fall going up the stairs the other night, was tired and pt's gf reports he was moving too quickly. Pt has a column that he holds on to. Discussed with Dustin Schneider importance of getting handrails with stairs for incr stability and safety - Dustin Schneider verbalized understanding and is planning to look into it. Discussed making sure that pt is not alone when trying to do stairs at home - making sure that someone is with him for incr safety due to recent fall and taking his time. Pt has an apple watch that can detect a fall (has it on RUE) so he can use LUE with it, and can call 911/emergency contact if pressing a side button. Therapist to also look into how to add favorite contacts to pt's home screen on watch.     Therapeutic Activity; With red mat on floor, performed fall prevention education, pt turning to face mat table using LUE and with RLE forwards to slowly lower to the ground and go into side sitting. Discussed the first step would be to make sure that pt is not injured before he tries to get up. From side sitting - could use scooting with LUE and BLE to get close to a surface and get into tall kneeling to face surface, using LUE support on mat - bringing RLE forwards (was not able to bring LLE forwards into half kneel) - therapist having to properly place RLE with min A to prevent incr external rotation of R knee to make it safe to stand, performed x3 reps throughout session. During last rep pt able to lean into mat table to try to bring RLE forwards more on his own, but still needing slight assistance. Able to steady himself upon standing using LUE and slowly turn back to sit on mat. Dustin Schneider also verbalized understanding how to perform safely, as pt would still need some assistance.     PT Education - 12/05/20 1031    Education Details see TA, getting scheduled for additional appts and changing some appts to the afternoon.     Person(s) Educated Patient   pt's gf   Methods Explanation;Demonstration    Comprehension Verbalized understanding;Returned demonstration            PT Short Term Goals - 11/14/20 1158      PT SHORT TERM GOAL #1   Title Pt will decrease TUG with cane from 18.52 sec to < 15 sec for improved balance and functional mobility.    Baseline 10/20/20 18.52 sec; 18.45 on 11/14/20    Time 4    Period Weeks    Status Not Met    Target Date 11/19/20      PT SHORT TERM GOAL #2   Title Pt will ambulate with cane with quad tip >600' on level surfaces supervision for improved household and  short community distances.    Time 4    Period Weeks    Status New    Target Date 11/19/20      PT SHORT TERM GOAL #3   Title Pt will increase gait speed from 0.1ms with cane to >0.667m for improved mobility.    Baseline 10/20/20 0.5440mwith cane; 11/14/20; 20.63 seconds = .48 m/s    Time 4    Period Weeks    Status Not Met    Target Date 11/19/20             PT Long Term Goals - 10/20/20 1343      PT LONG TERM GOAL #1   Title Pt will ambulate >800' on varied surfaces with cane supervision for improved community mobility.    Time 8    Period Weeks    Status New    Target Date 12/19/20      PT LONG TERM GOAL #2   Title Pt will increase gait speed with cane to >0.82m3mor improved community mobility.    Baseline 10/20/20 0.22m/47m Time 8    Period Weeks    Status New    Target Date 12/19/20      PT LONG TERM GOAL #3   Title Pt will increase Berg from 45 to >48/56 for improved balance and decreased fall risk.    Baseline 08/26/20 38/56. 10/20/20 45/56    Time 8    Period Weeks    Status Revised    Target Date 12/19/20      PT LONG TERM GOAL #4   Title Pt will ambulate up/down 2 steps with LRAD supervision for improved access in home to living room    Baseline when performing curb with cane needs min guard/min A at times for R foot clearance, 4 steps with cane min guard ascending, min A  descending    Time 8    Period Weeks    Status On-going    Target Date 12/19/20                 12/06/20 1749  Plan  Clinical Impression Statement Practiced fall recovery today due to pt's recent fall while going up the stairs the other night when feeling too tired (pt was with his gf Dustin Schneider and able to get up safely). Pt able to perform with min guard and needs min A when going into half kneeling to bring RLE into a good position to stand from and help push up with LUE (did not try bringing LLE forwards as pt might have trouble bringing RLE underneath him when standing). Also discussed use of railings for pt's stairs at home for incr safety (pt just has a column that he holds on to) when ascending on L. Will continue to progress towards LTGs.  Personal Factors and Comorbidities Comorbidity 3+  Comorbidities dense L MCA CVA, paroxysmal A. fib on chronic Xarelto, nonischemic cardiomyopathy and hypertension.  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 in order to improve on the following deficits Abnormal gait;Decreased activity tolerance;Decreased balance;Decreased cognition;Decreased mobility;Decreased coordination;Decreased range of motion;Decreased endurance;Decreased strength;Hypomobility;Difficulty walking;Impaired UE functional use;Impaired vision/preception;Postural dysfunction;Impaired sensation  Stability/Clinical Decision Making Evolving/Moderate complexity  Rehab Potential Good  PT Frequency 2x / week  PT Duration 8 weeks  PT Treatment/Interventions ADLs/Self Care Home Management;Aquatic Therapy;DME Instruction;Gait training;Stair training;Functional mobility training;Therapeutic activities;Therapeutic exercise;Electrical  Stimulation;Balance training;Neuromuscular re-education;Wheelchair mobility training;Orthotic Fit/Training;Patient/family education;Passive range of motion;Energy conservation  PT Next Visit Plan show how to add contacts to favorites on apple watch. fall prevention. continue with BWS try to help facilitate more right hip/knee flexion. stair training. continue R hip flexion strengthening. gait outdoors with cane.  PT Home Exercise Plan on 06/28/20 - added standing at countetop and stepping LLE out and in  Consulted and Agree with Plan of Care Patient      Patient will benefit from skilled therapeutic intervention in order to improve the following deficits and impairments:     Visit Diagnosis: Other abnormalities of gait and mobility  Muscle weakness (generalized)  Unsteadiness on feet  Other symptoms and signs involving the nervous system     Problem List Patient Active Problem List   Diagnosis Date Noted  . Dysarthria, post-stroke 11/03/2020  . Benign essential HTN 06/09/2020  . Abnormality of gait 04/28/2020  . Neurogenic bladder 03/17/2020  . Sepsis due to pneumonia ([place]) 03/06/2020  . History of stroke with residual deficit 03/06/2020  . Transient hypotension 03/06/2020  . Spastic hemiplegia affecting nondominant side ([place])   . History of hypertension   . Expressive aphasia   . AKI (acute kidney injury) ([place])   . Global aphasia   . Hypoalbuminemia due to protein-calorie malnutrition ([place])   . Dysphagia, post-stroke   . Hyperlipidemia 02/02/2020  . Dysphagia following cerebral infarction 02/02/2020  . Aspiration pneumonia ([place]) 02/02/2020  . Acute ischemic left MCA stroke ([place]) 02/02/2020  . Acute respiratory failure ([place])   . Acute ischemic stroke [facility] s/p clot retrieval L MCA & ACA A3 01/27/2020  . Aspiration into airway 01/27/2020  . Chronic anticoagulation 01/27/2020  . Paroxysmal atrial fibrillation ([place]) 06/15/2018  . Essential hypertension 06/15/2018  . NICM  (nonischemic cardiomyopathy) ([place]) 06/15/2018  . Visit for monitoring Tikosyn therapy 06/12/2018    Arliss Journey, PT, DPT  12/05/2020, 10:33 AM  [facility] [address] [place], [place], [address] Phone: [phone]   Fax:  [phone]  Name: Dustin Schneider MRN: [id number] Date of Birth: 01-Sep-1955

## 2020-12-08 ENCOUNTER — Other Ambulatory Visit: Payer: Self-pay | Admitting: Physical Medicine & Rehabilitation

## 2020-12-08 ENCOUNTER — Other Ambulatory Visit: Payer: Self-pay

## 2020-12-08 ENCOUNTER — Ambulatory Visit: Payer: BC Managed Care – PPO | Admitting: Physical Therapy

## 2020-12-08 DIAGNOSIS — R29818 Other symptoms and signs involving the nervous system: Secondary | ICD-10-CM

## 2020-12-08 DIAGNOSIS — R2689 Other abnormalities of gait and mobility: Secondary | ICD-10-CM

## 2020-12-08 DIAGNOSIS — M6281 Muscle weakness (generalized): Secondary | ICD-10-CM | POA: Diagnosis not present

## 2020-12-08 DIAGNOSIS — R2681 Unsteadiness on feet: Secondary | ICD-10-CM

## 2020-12-08 NOTE — Therapy (Addendum)
[facility] [address] [place], [place], [address] Phone: [phone]   Fax:  [phone]  Physical Therapy Treatment  Patient Details  Name: Dustin Schneider MRN: [id number] Date of Birth: Nov 10, 1955 Referring Provider (PT): Lauraine Rinne, PA-C but followed by Delice Lesch   Encounter Date: 12/08/2020   PT End of Session - 12/08/20 1114    Visit Number 59    Number of Visits 56    Authorization Type BCBS    PT Start Time 4081    PT Stop Time 0932    PT Time Calculation (min) 45 min    Equipment Utilized During Treatment Gait belt    Activity Tolerance Patient tolerated treatment well    Behavior During Therapy [facility] for tasks assessed/performed           Past Medical History:  Diagnosis Date  . Hypertension   . NICM (nonischemic cardiomyopathy) ([place]) 06/15/2018   04/23/18: EF 45%, 09/15/18: NOrmal LVEF  . Paroxysmal atrial fibrillation ([place]) 06/15/2018  . Stroke ([place])   . Visit for monitoring Tikosyn therapy 06/12/2018    Past Surgical History:  Procedure Laterality Date  . BUBBLE STUDY  03/11/2020   Procedure: BUBBLE STUDY;  Surgeon: [name], MD;  Location: [facility];  Service: Cardiovascular;;  . CARDIOVERSION N/A 05/13/2018   Procedure: CARDIOVERSION;  Surgeon: [name], MD;  Location: [facility] ENDOSCOPY;  Service: Cardiovascular;  Laterality: N/A;  . CARDIOVERSION N/A 06/13/2018   Procedure: CARDIOVERSION;  Surgeon: [name], MD;  Location: [facility] ENDOSCOPY;  Service: Cardiovascular;  Laterality: N/A;  . CARDIOVERSION N/A 06/23/2018   Procedure: CARDIOVERSION;  Surgeon: [name], MD;  Location: [facility] ENDOSCOPY;  Service: Cardiovascular;  Laterality: N/A;  . CARDIOVERSION N/A 03/11/2020   Procedure: CARDIOVERSION;  Surgeon: [name], MD;  Location: [place];  Service: Cardiovascular;  Laterality: N/A;  . excision of actinic keratosis    . EYE SURGERY     eye lift  . IR CT HEAD LTD  01/27/2020  .  IR PERCUTANEOUS ART THROMBECTOMY/INFUSION INTRACRANIAL INC DIAG ANGIO  01/27/2020      . IR PERCUTANEOUS ART THROMBECTOMY/INFUSION INTRACRANIAL INC DIAG ANGIO  01/27/2020  . RADIOLOGY WITH ANESTHESIA N/A 01/27/2020   Procedure: IR WITH ANESTHESIA;  Surgeon: Radiologist, Medication, MD;  Location: [place];  Service: Radiology;  Laterality: N/A;  . TEE WITHOUT CARDIOVERSION N/A 03/11/2020   Procedure: TRANSESOPHAGEAL ECHOCARDIOGRAM (TEE);  Surgeon: [name], MD;  Location: [place];  Service: Cardiovascular;  Laterality: N/A;  . TONSILLECTOMY    . WISDOM TOOTH EXTRACTION      There were no vitals filed for this visit.      12/11/20 2031  Symptoms/Limitations  Subjective No changes since he was last here.  Patient is accompained by:  (gf, [name])  Pertinent History PMH: HTN, paroxysmal a fib  Limitations Standing;Walking  Patient Stated Goals "wants everything working on R side again"  Pain Assessment  Currently in Pain? No/denies           12/08/20 0001  Ambulation/Gait  Ambulation/Gait Yes  Gait Comments BWS support over treadmill at 1.0 mph unweighted approx. #30 for 11 minutes total. Therapist providing min/mod A for RLE foot clearance for incr hip/knee flexion. Intermittent tactile and verbal cues for R hip flexion       Therapeutic Activity: -pt's gf brought in grab bars to be installed onto pt's columns at home to use as a modified railing to go up 2 steps in his house,  handyman will come and install them -pt showed therapist video on how he currently ascends/descends his 2 small steps in his home with the column and his cane, pt reaching up over 2 steps to place cane at the top and using column with LUE to ascend (where grab bar will soon be), pt also reaches down to place cane at bottom of 2 steps, discussed for safety and for pt to not reach as far forward to place cane, can try placing cane on step and stepping down to step using grab bar and then using cane to  descend last step as it descending a curb. [name] gf to try with pt at home -therapist emailed information to pt's gf for how to add favorite contacts to pt's home screen on his apple watch for safety and incr ease of use to reach an emergency contact -pt brought in his [facility] boot and has not been wearing it due to the velco not working. Provided pt with new velcro so straps can stay on and pt can wear it at night. [name] gf to sew on new velcro to [facility] boot                PT Education - 12/08/20 1114    Education Details see TA    Person(s) Educated Patient   pt's gf [name]   Methods Explanation;Demonstration    Comprehension Verbalized understanding            PT Short Term Goals - 11/14/20 1158      PT SHORT TERM GOAL #1   Title Pt will decrease TUG with cane from 18.52 sec to < 15 sec for improved balance and functional mobility.    Baseline 10/20/20 18.52 sec; 18.45 on 11/14/20    Time 4    Period Weeks    Status Not Met    Target Date 11/19/20      PT SHORT TERM GOAL #2   Title Pt will ambulate with cane with quad tip >600' on level surfaces supervision for improved household and short community distances.    Time 4    Period Weeks    Status New    Target Date 11/19/20      PT SHORT TERM GOAL #3   Title Pt will increase gait speed from 0.7ms with cane to >0.616m for improved mobility.    Baseline 10/20/20 0.544mwith cane; 11/14/20; 20.63 seconds = .48 m/s    Time 4    Period Weeks    Status Not Met    Target Date 11/19/20             PT Long Term Goals - 10/20/20 1343      PT LONG TERM GOAL #1   Title Pt will ambulate >800' on varied surfaces with cane supervision for improved community mobility.    Time 8    Period Weeks    Status New    Target Date 12/19/20      PT LONG TERM GOAL #2   Title Pt will increase gait speed with cane to >0.28m84mor improved community mobility.    Baseline 10/20/20 0.28m/62m Time 8    Period Weeks    Status New     Target Date 12/19/20      PT LONG TERM GOAL #3   Title Pt will increase Berg from 45 to >48/56 for improved balance and decreased fall risk.    Baseline 08/26/20 38/56. 10/20/20 45/56  Time 8    Period Weeks    Status Revised    Target Date 12/19/20      PT LONG TERM GOAL #4   Title Pt will ambulate up/down 2 steps with LRAD supervision for improved access in home to living room    Baseline when performing curb with cane needs min guard/min A at times for R foot clearance, 4 steps with cane min guard ascending, min A descending    Time 8    Period Weeks    Status On-going    Target Date 12/19/20               12/11/20 2037  Plan  Clinical Impression Statement [name] gf brought in grab bars to have installed to pt's columns at home to simulate a railing for incr safety with steps. Also discussed safety with stairs with cane placement - possibly placing cane on next step down when descending and performing 2nd step like a curb instead of reaching forward and placing cane on the ground. Continued BWS treadmill training at level 1.0 today with therapist assisting pt with RLE hip/knee flexion during swing phase. Will continue to progress towards LTGs.  Personal Factors and Comorbidities Comorbidity 3+  Comorbidities dense L MCA CVA, paroxysmal A. fib on chronic Xarelto, nonischemic cardiomyopathy and hypertension.  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, works as an Gaffer)  Pt will benefit from skilled therapeutic intervention in order to improve on the following deficits Abnormal gait;Decreased activity tolerance;Decreased balance;Decreased cognition;Decreased mobility;Decreased coordination;Decreased range of motion;Decreased endurance;Decreased strength;Hypomobility;Difficulty walking;Impaired UE  functional use;Impaired vision/preception;Postural dysfunction;Impaired sensation  Stability/Clinical Decision Making Evolving/Moderate complexity  Rehab Potential Good  PT Frequency 2x / week  PT Duration 8 weeks  PT Treatment/Interventions ADLs/Self Care Home Management;Aquatic Therapy;DME Instruction;Gait training;Stair training;Functional mobility training;Therapeutic activities;Therapeutic exercise;Electrical Stimulation;Balance training;Neuromuscular re-education;Wheelchair mobility training;Orthotic Fit/Training;Patient/family education;Passive range of motion;Energy conservation  PT Next Visit Plan continue with BWS try to help facilitate more right hip/knee flexion. stair training. continue R hip flexion strengthening. gait outdoors with cane.  PT Home Exercise Plan on 06/28/20 - added standing at countetop and stepping LLE out and in  Consulted and Agree with Plan of Care Patient        Patient will benefit from skilled therapeutic intervention in order to improve the following deficits and impairments:     Visit Diagnosis: Muscle weakness (generalized)  Other abnormalities of gait and mobility  Unsteadiness on feet  Other symptoms and signs involving the nervous system     Problem List Patient Active Problem List   Diagnosis Date Noted  . Dysarthria, post-stroke 11/03/2020  . Benign essential HTN 06/09/2020  . Abnormality of gait 04/28/2020  . Neurogenic bladder 03/17/2020  . Sepsis due to pneumonia ([place]) 03/06/2020  . History of stroke with residual deficit 03/06/2020  . Transient hypotension 03/06/2020  . Spastic hemiplegia affecting nondominant side ([place])   . History of hypertension   . Expressive aphasia   . AKI (acute kidney injury) ([place])   . Global aphasia   . Hypoalbuminemia due to protein-calorie malnutrition ([place])   . Dysphagia, post-stroke   . Hyperlipidemia 02/02/2020  . Dysphagia following cerebral infarction 02/02/2020  . Aspiration pneumonia  ([place]) 02/02/2020  . Acute ischemic left MCA stroke ([place]) 02/02/2020  . Acute respiratory failure ([place])   . Acute ischemic stroke [facility]) s/p clot retrieval L MCA & ACA A3 01/27/2020  . Aspiration into airway 01/27/2020  . Chronic  anticoagulation 01/27/2020  . Paroxysmal atrial fibrillation ([place]) 06/15/2018  . Essential hypertension 06/15/2018  . NICM (nonischemic cardiomyopathy) ([place]) 06/15/2018  . Visit for monitoring Tikosyn therapy 06/12/2018    Arliss Journey, PT, DPT  12/08/2020, 11:15 AM  [facility] [address] [place], [place], [address] Phone: [phone]   Fax:  [phone]  Name: Dustin Schneider MRN: [id number] Date of Birth: 01/09/1956

## 2020-12-12 ENCOUNTER — Encounter: Payer: Self-pay | Admitting: Physical Therapy

## 2020-12-12 ENCOUNTER — Ambulatory Visit: Payer: BC Managed Care – PPO | Attending: Physician Assistant | Admitting: Physical Therapy

## 2020-12-12 ENCOUNTER — Other Ambulatory Visit: Payer: Self-pay

## 2020-12-12 DIAGNOSIS — R29818 Other symptoms and signs involving the nervous system: Secondary | ICD-10-CM | POA: Diagnosis present

## 2020-12-12 DIAGNOSIS — R2681 Unsteadiness on feet: Secondary | ICD-10-CM

## 2020-12-12 DIAGNOSIS — M6281 Muscle weakness (generalized): Secondary | ICD-10-CM | POA: Diagnosis not present

## 2020-12-12 DIAGNOSIS — R2689 Other abnormalities of gait and mobility: Secondary | ICD-10-CM | POA: Diagnosis present

## 2020-12-12 DIAGNOSIS — I69351 Hemiplegia and hemiparesis following cerebral infarction affecting right dominant side: Secondary | ICD-10-CM | POA: Insufficient documentation

## 2020-12-13 NOTE — Therapy (Addendum)
[facility] [address] [place], [place], [address] Phone: [phone]   Fax:  [phone]  Physical Therapy Treatment  Patient Details  Name: Dustin Schneider MRN: [id number] Date of Birth: 01-Aug-1956 Referring Provider (PT): Lauraine Rinne, PA-C but followed by Delice Lesch   Encounter Date: 12/12/2020   PT End of Session - 12/13/20 1051    Visit Number 82    Number of Visits 23    Authorization Type BCBS    PT Start Time 7416    PT Stop Time 1615    PT Time Calculation (min) 41 min    Equipment Utilized During Treatment Gait belt    Activity Tolerance Patient tolerated treatment well    Behavior During Therapy WFL for tasks assessed/performed           Past Medical History:  Diagnosis Date  . Hypertension   . NICM (nonischemic cardiomyopathy) ([place]) 06/15/2018   04/23/18: EF 45%, 09/15/18: NOrmal LVEF  . Paroxysmal atrial fibrillation ([place]) 06/15/2018  . Stroke ([place])   . Visit for monitoring Tikosyn therapy 06/12/2018    Past Surgical History:  Procedure Laterality Date  . BUBBLE STUDY  03/11/2020   Procedure: BUBBLE STUDY;  Surgeon: [name], MD;  Location: [facility];  Service: Cardiovascular;;  . CARDIOVERSION N/A 05/13/2018   Procedure: CARDIOVERSION;  Surgeon: [name], MD;  Location: [facility] ENDOSCOPY;  Service: Cardiovascular;  Laterality: N/A;  . CARDIOVERSION N/A 06/13/2018   Procedure: CARDIOVERSION;  Surgeon: [name], MD;  Location: [facility] ENDOSCOPY;  Service: Cardiovascular;  Laterality: N/A;  . CARDIOVERSION N/A 06/23/2018   Procedure: CARDIOVERSION;  Surgeon: [name], MD;  Location: [facility] ENDOSCOPY;  Service: Cardiovascular;  Laterality: N/A;  . CARDIOVERSION N/A 03/11/2020   Procedure: CARDIOVERSION;  Surgeon: [name], MD;  Location: [place];  Service: Cardiovascular;  Laterality: N/A;  . excision of actinic keratosis    . EYE SURGERY     eye lift  . IR CT HEAD LTD  01/27/2020  . IR  PERCUTANEOUS ART THROMBECTOMY/INFUSION INTRACRANIAL INC DIAG ANGIO  01/27/2020      . IR PERCUTANEOUS ART THROMBECTOMY/INFUSION INTRACRANIAL INC DIAG ANGIO  01/27/2020  . RADIOLOGY WITH ANESTHESIA N/A 01/27/2020   Procedure: IR WITH ANESTHESIA;  Surgeon: Radiologist, Medication, MD;  Location: [place];  Service: Radiology;  Laterality: N/A;  . TEE WITHOUT CARDIOVERSION N/A 03/11/2020   Procedure: TRANSESOPHAGEAL ECHOCARDIOGRAM (TEE);  Surgeon: [name], MD;  Location: [place];  Service: Cardiovascular;  Laterality: N/A;  . TONSILLECTOMY    . WISDOM TOOTH EXTRACTION      There were no vitals filed for this visit.   Subjective Assessment - 12/12/20 1542    Subjective Had a fall over the weekend at [employer]. Happened later on in the day at about 5:30. Was going up 1/2 a step, not sure if he got his shoe caught or not. Was able to get up on his own.    Patient is accompained by: --   gf, [name]   Pertinent History PMH: HTN, paroxysmal a fib    Limitations Standing;Walking    Patient Stated Goals "wants everything working on R side again"    Currently in Pain? No/denies                             [facility] Adult PT Treatment/Exercise - 12/13/20 1054      Therapeutic Activites    Therapeutic Activities Other Therapeutic  Activities    Other Therapeutic Activities Pt had another recent fall without injury when he was more fatigued walking with his cane; discussed with pt and pt's caregiver that when pt has to do more during the day and esp closer to evening time, making sure that pt is using his RW for gait to conserve energy. And to make sure that pt is wearing his gait belt as well. Also discussed importance of pt wearing his apple watch at all times for fall detection/having easy access to contact emergency contacts. Reviewed fall recovery strategies with pt's caregiver Jonie - performed with red mat on the floor and used a chair to help stand up, pt able to get on the floor by himself  with min guard, from that position pt able to get into tall kneeling position, min A needed to help get RLE positioned in proper alignment in half kneel to help push up to stand, once in standing to use cane and turn to sit down in chair, cues to fully turn vs. Pivoting and sitting down. Performed with therapist x2 reps and performed with caregiver x1 rep. Cues to take his time when performing each step.      Neuro Re-ed    Neuro Re-ed Details  in // bars; alternating stepping over 4 2" obstacles with LUE support, down and back x4 reps with focus on R hip flexion to clear and slowed pace to focus on clearing leg, x3 reps down and back with step to pattern and leading first with RLE for incr hip/knee flexion, intermittent cues at pelvic for incr hip flexion to clear leg, with 4" step 2 x 10 reps of tapping RLE up to step and back down to floor - assist from therapist at pelvis on R to help prevent compensatory movemements and to incr R hip flexion, cues to keep L foot on ground as pt with tendency at times to raise up onto L toes to clear R foot from step, with RLE as stance leg on the ground - tapping LLE onto step (using UE support) and then holding modified SLS position with no UE support 4 x 10 seconds with use of mirror as visual cue for midline and cues for R hip/knee extension.                  PT Education - 12/13/20 1051    Education Details see TA    Person(s) Educated Patient   pt's caregiver   Methods Explanation;Verbal cues;Demonstration    Comprehension Verbalized understanding;Returned demonstration            PT Short Term Goals - 11/14/20 1158      PT SHORT TERM GOAL #1   Title Pt will decrease TUG with cane from 18.52 sec to < 15 sec for improved balance and functional mobility.    Baseline 10/20/20 18.52 sec; 18.45 on 11/14/20    Time 4    Period Weeks    Status Not Met    Target Date 11/19/20      PT SHORT TERM GOAL #2   Title Pt will ambulate with cane with quad  tip >600' on level surfaces supervision for improved household and short community distances.    Time 4    Period Weeks    Status New    Target Date 11/19/20      PT SHORT TERM GOAL #3   Title Pt will increase gait speed from 0.44ms with cane to >0.620m for improved mobility.  Baseline 10/20/20 0.88ms with cane; 11/14/20; 20.63 seconds = .48 m/s    Time 4    Period Weeks    Status Not Met    Target Date 11/19/20             PT Long Term Goals - 10/20/20 1343      PT LONG TERM GOAL #1   Title Pt will ambulate >800' on varied surfaces with cane supervision for improved community mobility.    Time 8    Period Weeks    Status New    Target Date 12/19/20      PT LONG TERM GOAL #2   Title Pt will increase gait speed with cane to >0.755m for improved community mobility.    Baseline 10/20/20 0.5471m   Time 8    Period Weeks    Status New    Target Date 12/19/20      PT LONG TERM GOAL #3   Title Pt will increase Berg from 45 to >48/56 for improved balance and decreased fall risk.    Baseline 08/26/20 38/56. 10/20/20 45/56    Time 8    Period Weeks    Status Revised    Target Date 12/19/20      PT LONG TERM GOAL #4   Title Pt will ambulate up/down 2 steps with LRAD supervision for improved access in home to living room    Baseline when performing curb with cane needs min guard/min A at times for R foot clearance, 4 steps with cane min guard ascending, min A descending    Time 8    Period Weeks    Status On-going    Target Date 12/19/20               12/14/20 0820  Plan  Clinical Impression Statement Today's skilled session focused on reviewing fall recovery strategies with pt's caregiver as pt had another recent fall when walking with his cane when he felt more tired. Discussed use of RW when out at the end of the day and has to do more walking to help conserve energy and safety with gait. Remainder of session focused on activities for RLE foot clearance and R hip  flexion stepping up onto step and over obstacles. Will continue to progress towards LTGs.  Personal Factors and Comorbidities Comorbidity 3+  Comorbidities dense L MCA CVA, paroxysmal A. fib on chronic Xarelto, nonischemic cardiomyopathy and hypertension.  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 in order to improve on the following deficits Abnormal gait;Decreased activity tolerance;Decreased balance;Decreased cognition;Decreased mobility;Decreased coordination;Decreased range of motion;Decreased endurance;Decreased strength;Hypomobility;Difficulty walking;Impaired UE functional use;Impaired vision/preception;Postural dysfunction;Impaired sensation  Stability/Clinical Decision Making Evolving/Moderate complexity  Rehab Potential Good  PT Frequency 2x / week  PT Duration 8 weeks  PT Treatment/Interventions ADLs/Self Care Home Management;Aquatic Therapy;DME Instruction;Gait training;Stair training;Functional mobility training;Therapeutic activities;Therapeutic exercise;Electrical Stimulation;Balance training;Neuromuscular re-education;Wheelchair mobility training;Orthotic Fit/Training;Patient/family education;Passive range of motion;Energy conservation  PT Next Visit Plan  next week is week 8 in POC- will need re-cert!!! continue with BWS try to help facilitate more right hip/knee flexion. stair training and obstacle negotiation. SLS balnce strategies for RLE. continue R hip flexion strengthening!! gait outdoors with cane.   PT Home Exercise Plan on 06/28/20 - added standing at countetop and stepping LLE out and in  Consulted and Agree with Plan of Care Patient  Patient will benefit from skilled therapeutic intervention in order to  improve the following deficits and impairments:     Visit Diagnosis: Muscle weakness (generalized)  Other abnormalities of gait and mobility  Unsteadiness on feet  Other symptoms and signs involving the nervous system     Problem List Patient Active Problem List   Diagnosis Date Noted  . Dysarthria, post-stroke 11/03/2020  . Benign essential HTN 06/09/2020  . Abnormality of gait 04/28/2020  . Neurogenic bladder 03/17/2020  . Sepsis due to pneumonia ([place]) 03/06/2020  . History of stroke with residual deficit 03/06/2020  . Transient hypotension 03/06/2020  . Spastic hemiplegia affecting nondominant side ([place])   . History of hypertension   . Expressive aphasia   . AKI (acute kidney injury) ([place])   . Global aphasia   . Hypoalbuminemia due to protein-calorie malnutrition ([place])   . Dysphagia, post-stroke   . Hyperlipidemia 02/02/2020  . Dysphagia following cerebral infarction 02/02/2020  . Aspiration pneumonia ([employer]) 02/02/2020  . Acute ischemic left MCA stroke ([place]) 02/02/2020  . Acute respiratory failure ([place])   . Acute ischemic stroke [facility]) s/p clot retrieval L MCA & ACA A3 01/27/2020  . Aspiration into airway 01/27/2020  . Chronic anticoagulation 01/27/2020  . Paroxysmal atrial fibrillation ([place]) 06/15/2018  . Essential hypertension 06/15/2018  . NICM (nonischemic cardiomyopathy) ([place]) 06/15/2018  . Visit for monitoring Tikosyn therapy 06/12/2018    Arliss Journey, PT, DPT  12/13/2020, 10:55 AM  [facility] [address] [place], [place], [address] Phone: [phone]   Fax:  [phone]  Name: ALIC HILBURN MRN: [id number] Date of Birth: 09-Jun-1956

## 2020-12-15 ENCOUNTER — Ambulatory Visit: Payer: BC Managed Care – PPO | Admitting: Physical Therapy

## 2020-12-16 ENCOUNTER — Ambulatory Visit: Payer: BC Managed Care – PPO | Admitting: Physical Therapy

## 2020-12-16 ENCOUNTER — Encounter: Payer: Self-pay | Admitting: Physical Therapy

## 2020-12-16 ENCOUNTER — Other Ambulatory Visit: Payer: Self-pay

## 2020-12-16 ENCOUNTER — Telehealth: Payer: Self-pay | Admitting: *Deleted

## 2020-12-16 VITALS — BP 126/77 | HR 53

## 2020-12-16 DIAGNOSIS — M6281 Muscle weakness (generalized): Secondary | ICD-10-CM

## 2020-12-16 DIAGNOSIS — R2689 Other abnormalities of gait and mobility: Secondary | ICD-10-CM

## 2020-12-16 DIAGNOSIS — R29818 Other symptoms and signs involving the nervous system: Secondary | ICD-10-CM

## 2020-12-16 DIAGNOSIS — R2681 Unsteadiness on feet: Secondary | ICD-10-CM

## 2020-12-16 NOTE — Therapy (Addendum)
[facility] [address] [place], [place], [address] Phone: [phone]   Fax:  [phone]  Physical Therapy Treatment  Patient Details  Name: Dustin Schneider MRN: [id number] Date of Birth: [birth year] Referring Provider (PT): Lauraine Rinne, PA-C but followed by Delice Lesch   Encounter Date: 12/16/2020   PT End of Session - 12/16/20 1537    Visit Number 54    Number of Visits 23    Authorization Type BCBS    PT Start Time 4097    PT Stop Time 1446    PT Time Calculation (min) 43 min    Equipment Utilized During Treatment Gait belt    Activity Tolerance Patient tolerated treatment well    Behavior During Therapy [facility] for tasks assessed/performed           Past Medical History:  Diagnosis Date  . Hypertension   . NICM (nonischemic cardiomyopathy) ([place]) 06/15/2018   04/23/18: EF 45%, 09/15/18: NOrmal LVEF  . Paroxysmal atrial fibrillation ([place]) 06/15/2018  . Stroke ([place])   . Visit for monitoring Tikosyn therapy 06/12/2018    Past Surgical History:  Procedure Laterality Date  . BUBBLE STUDY  03/11/2020   Procedure: BUBBLE STUDY;  Surgeon: [name], MD;  Location: [place];  Service: Cardiovascular;;  . CARDIOVERSION N/A 05/13/2018   Procedure: CARDIOVERSION;  Surgeon: [name], MD;  Location: [facility] ENDOSCOPY;  Service: Cardiovascular;  Laterality: N/A;  . CARDIOVERSION N/A 06/13/2018   Procedure: CARDIOVERSION;  Surgeon: [name], MD;  Location: [facility] ENDOSCOPY;  Service: Cardiovascular;  Laterality: N/A;  . CARDIOVERSION N/A 06/23/2018   Procedure: CARDIOVERSION;  Surgeon: [name], MD;  Location: [facility] ENDOSCOPY;  Service: Cardiovascular;  Laterality: N/A;  . CARDIOVERSION N/A 03/11/2020   Procedure: CARDIOVERSION;  Surgeon: [name], MD;  Location: [place];  Service: Cardiovascular;  Laterality: N/A;  . excision of actinic keratosis    . EYE SURGERY     eye lift  . IR CT HEAD LTD  01/27/2020  . IR  PERCUTANEOUS ART THROMBECTOMY/INFUSION INTRACRANIAL INC DIAG ANGIO  01/27/2020      . IR PERCUTANEOUS ART THROMBECTOMY/INFUSION INTRACRANIAL INC DIAG ANGIO  01/27/2020  . RADIOLOGY WITH ANESTHESIA N/A 01/27/2020   Procedure: IR WITH ANESTHESIA;  Surgeon: Radiologist, Medication, MD;  Location: [place];  Service: Radiology;  Laterality: N/A;  . TEE WITHOUT CARDIOVERSION N/A 03/11/2020   Procedure: TRANSESOPHAGEAL ECHOCARDIOGRAM (TEE);  Surgeon: [name], MD;  Location: [place];  Service: Cardiovascular;  Laterality: N/A;  . TONSILLECTOMY    . WISDOM TOOTH EXTRACTION      Vitals:   12/16/20 1411  BP: 126/77  Pulse: (!) 53     Subjective Assessment - 12/16/20 1408    Subjective Noticed that he has been feeling a little bit more unsteady with his walking esp in grass- noticed it the past couple of days. Made an appt with Dr. Posey Pronto next week to possibly get botox. Reports it feels harder to lift his left foot.    Patient is accompained by: --   gf, [name]   Pertinent History PMH: HTN, paroxysmal a fib    Limitations Standing;Walking    Patient Stated Goals "wants everything working on R side again"    Currently in Pain? No/denies                                  Balance Exercises - 12/16/20 1429  Balance Exercises: Standing   Standing Eyes Closed Foam/compliant surface    Standing Eyes Closed Limitations feet hip width then more narrow BOS x4 reps total for 30 seconds each, with feet hip width distance eyes closed 2 x 10 reps head nods, x10 reps head turns - incr difficulty with head turns    SLS with Vectors Solid surface;Intermittent upper extremity assist;Upper extremity assist 1    SLS with Vectors Limitations with RLE on floor and tapping LLE to 6" steps x15 reps total, cues for shifting to RLE first and standing tall and slowed/controlled, beginning with UE support and then progressing to none    Stepping Strategy Foam/compliant  surface;Posterior;Lateral;UE support;Limitations    Stepping Strategy Limitations on blue air ex x15 reps each direction, keeping RLE as stance leg, progressing from UE support>none with min guard, cues for slowed and controlled    Step Ups Forward;6 inch;UE support 1    Balance Master: Limits for Stability with RLE on step, and holding on with LUE onto // bars, floating non-stance LLE, cues for posture when looking up in mirror x12 reps    Other Standing Exercises on blue mat standing with cane; stepping LLE to green floor dot for incr step length and for incr stance time with RLE x10 reps,then x10 reps without UE support            12/18/20 2110  Ambulation/Gait  Ambulation/Gait Yes  Ambulation/Gait Assistance 5: Supervision  Ambulation/Gait Assistance Details cued for incr LLE step length esp at end of session, pt with taking a smaller step length with LLE at times, but no LOB  Ambulation Distance (Feet) 200 Feet  Assistive device Straight cane (with 6 prong tip)  Gait Pattern Step-through pattern;Decreased hip/knee flexion - right  Ambulation Surface Level;Indoor  Therapeutic Activites   Therapeutic Activities Other Therapeutic Activities  Other Therapeutic Activities pt reports feeling more unsteady with his gait, more so in the last week, esp over grass surfaces. is reaching out to see about getting botox again to his RLE (last time was 3 months ago). discussed with pt making sure that he is using his RW during longer distances and when more fatigued if feeling more unsteady (esp tonight when pt goes to [employer] and when pavement will be slippery from rain), discussed with pt not ambulating on grass without having someone with him and pt wearing gait belt, pt verbalized understanding      PT Education - 12/16/20 1536    Education Details see TA    Person(s) Educated Patient   pt's brother in Art gallery manager   Methods Explanation    Comprehension Verbalized understanding             PT Short Term Goals - 11/14/20 1158      PT SHORT TERM GOAL #1   Title Pt will decrease TUG with cane from 18.52 sec to < 15 sec for improved balance and functional mobility.    Baseline 10/20/20 18.52 sec; 18.45 on 11/14/20    Time 4    Period Weeks    Status Not Met    Target Date 11/19/20      PT SHORT TERM GOAL #2   Title Pt will ambulate with cane with quad tip >600' on level surfaces supervision for improved household and short community distances.    Time 4    Period Weeks    Status New    Target Date 11/19/20      PT SHORT TERM GOAL #  3   Title Pt will increase gait speed from 0.76ms with cane to >0.659m for improved mobility.    Baseline 10/20/20 0.5480mwith cane; 11/14/20; 20.63 seconds = .48 m/s    Time 4    Period Weeks    Status Not Met    Target Date 11/19/20             PT Long Term Goals - 10/20/20 1343      PT LONG TERM GOAL #1   Title Pt will ambulate >800' on varied surfaces with cane supervision for improved community mobility.    Time 8    Period Weeks    Status New    Target Date 12/19/20      PT LONG TERM GOAL #2   Title Pt will increase gait speed with cane to >0.20m70mor improved community mobility.    Baseline 10/20/20 0.93m/76m Time 8    Period Weeks    Status New    Target Date 12/19/20      PT LONG TERM GOAL #3   Title Pt will increase Berg from 45 to >48/56 for improved balance and decreased fall risk.    Baseline 08/26/20 38/56. 10/20/20 45/56    Time 8    Period Weeks    Status Revised    Target Date 12/19/20      PT LONG TERM GOAL #4   Title Pt will ambulate up/down 2 steps with LRAD supervision for improved access in home to living room    Baseline when performing curb with cane needs min guard/min A at times for R foot clearance, 4 steps with cane min guard ascending, min A descending    Time 8    Period Weeks    Status On-going    Target Date 12/19/20               12/18/20 2116  Plan  Clinical Impression  Statement Pt reports that he has been feeling a bit more unsteady with gait the past couple of days (more so on unlevel surfaces). BP WFL. Will see Dr. PatelPosey Pronto week, for potentially possible botox to RLE (pt last received Botox approx. 3 months ago). Session focused on balance strategies on unlevel surfaces and for incr stance time/weight shift to RLE. Pt able to tolerate well and progress exercises to no UE support for some reps. Will continue to progress towards LTGs.  Personal Factors and Comorbidities Comorbidity 3+  Comorbidities dense L MCA CVA, paroxysmal A. fib on chronic Xarelto, nonischemic cardiomyopathy and hypertension.  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 in order to improve on the following deficits Abnormal gait;Decreased activity tolerance;Decreased balance;Decreased cognition;Decreased mobility;Decreased coordination;Decreased range of motion;Decreased endurance;Decreased strength;Hypomobility;Difficulty walking;Impaired UE functional use;Impaired vision/preception;Postural dysfunction;Impaired sensation  Stability/Clinical Decision Making Evolving/Moderate complexity  Rehab Potential Good  PT Frequency 2x / week  PT Duration 8 weeks  PT Treatment/Interventions ADLs/Self Care Home Management;Aquatic Therapy;DME Instruction;Gait training;Stair training;Functional mobility training;Therapeutic activities;Therapeutic exercise;Electrical Stimulation;Balance training;Neuromuscular re-education;Wheelchair mobility training;Orthotic Fit/Training;Patient/family education;Passive range of motion;Energy conservation  PT Next Visit Plan goals due this week - perform re-cert/ continue with BWS try to help facilitate more right  hip/knee flexion. stair training and obstacle negotiation. SLS balnce strategies for RLE. continue R hip flexion strengthening!! gait outdoors with cane.  PT Home Exercise Plan on 06/28/20 - added standing at countetop and stepping LLE  out and in  Consulted and Agree with Plan of Care Patient        Patient will benefit from skilled therapeutic intervention in order to improve the following deficits and impairments:     Visit Diagnosis: Muscle weakness (generalized)  Other abnormalities of gait and mobility  Unsteadiness on feet  Other symptoms and signs involving the nervous system     Problem List Patient Active Problem List   Diagnosis Date Noted  . Dysarthria, post-stroke 11/03/2020  . Benign essential HTN 06/09/2020  . Abnormality of gait 04/28/2020  . Neurogenic bladder 03/17/2020  . Sepsis due to pneumonia ([place]) 03/06/2020  . History of stroke with residual deficit 03/06/2020  . Transient hypotension 03/06/2020  . Spastic hemiplegia affecting nondominant side ([place])   . History of hypertension   . Expressive aphasia   . AKI (acute kidney injury) ([place])   . Global aphasia   . Hypoalbuminemia due to protein-calorie malnutrition ([place])   . Dysphagia, post-stroke   . Hyperlipidemia 02/02/2020  . Dysphagia following cerebral infarction 02/02/2020  . Aspiration pneumonia ([place]) 02/02/2020  . Acute ischemic left MCA stroke ([place]) 02/02/2020  . Acute respiratory failure ([place])   . Acute ischemic stroke [facility]) s/p clot retrieval L MCA & ACA A3 01/27/2020  . Aspiration into airway 01/27/2020  . Chronic anticoagulation 01/27/2020  . Paroxysmal atrial fibrillation ([place]) 06/15/2018  . Essential hypertension 06/15/2018  . NICM (nonischemic cardiomyopathy) ([place]) 06/15/2018  . Visit for monitoring Tikosyn therapy 06/12/2018    Arliss Journey, PT, DPT  12/16/2020, 3:39 PM  [facility] [address] [place],  [place], [address] Phone: [phone]   Fax:  [phone]  Name: Dustin Schneider MRN: [id number] Date of Birth: 07/26/1956

## 2020-12-16 NOTE — Telephone Encounter (Signed)
Mr Dustin Schneider would like to have Botox Tuesday at his appt in his R leg.  Please advise.

## 2020-12-17 NOTE — Telephone Encounter (Signed)
Does he also want to inject his wrist flexors? The other 2 things we will need to consider: Is the limitation on injection every 3 months or 90 days? If it is the latter, we will be a day short. Do we have prior auth for 200 units (it would be an increase from last injection) as well as a potential additional extremity.   Thanks.

## 2020-12-19 ENCOUNTER — Other Ambulatory Visit: Payer: Self-pay

## 2020-12-19 ENCOUNTER — Ambulatory Visit: Payer: BC Managed Care – PPO | Admitting: Physical Therapy

## 2020-12-19 ENCOUNTER — Encounter: Payer: Self-pay | Admitting: Physical Therapy

## 2020-12-19 DIAGNOSIS — R2689 Other abnormalities of gait and mobility: Secondary | ICD-10-CM

## 2020-12-19 DIAGNOSIS — R2681 Unsteadiness on feet: Secondary | ICD-10-CM

## 2020-12-19 DIAGNOSIS — M6281 Muscle weakness (generalized): Secondary | ICD-10-CM

## 2020-12-19 DIAGNOSIS — R29818 Other symptoms and signs involving the nervous system: Secondary | ICD-10-CM

## 2020-12-19 NOTE — Therapy (Signed)
[facility] [address] [place], [place], [address] Phone: [phone]   Fax:  [phone]  Physical Therapy Treatment  Patient Details  Name: Dustin Schneider MRN: [id number] Date of Birth: 1955-12-05 Referring Provider (PT): Lauraine Rinne, PA-C but followed by Delice Lesch   Encounter Date: 12/19/2020   PT End of Session - 12/19/20 1629    Visit Number 10    Number of Visits 44    Authorization Type BCBS    PT Start Time 0165    PT Stop Time 1615    PT Time Calculation (min) 44 min    Equipment Utilized During Treatment Gait belt    Activity Tolerance Patient tolerated treatment well    Behavior During Therapy [facility] for tasks assessed/performed           Past Medical History:  Diagnosis Date  . Hypertension   . NICM (nonischemic cardiomyopathy) ([place]) 06/15/2018   04/23/18: EF 45%, 09/15/18: NOrmal LVEF  . Paroxysmal atrial fibrillation ([place]) 06/15/2018  . Stroke ([place])   . Visit for monitoring Tikosyn therapy 06/12/2018    Past Surgical History:  Procedure Laterality Date  . BUBBLE STUDY  03/11/2020   Procedure: BUBBLE STUDY;  Surgeon: [name], MD;  Location: [place];  Service: Cardiovascular;;  . CARDIOVERSION N/A 05/13/2018   Procedure: CARDIOVERSION;  Surgeon: [name], MD;  Location: [facility] ENDOSCOPY;  Service: Cardiovascular;  Laterality: N/A;  . CARDIOVERSION N/A 06/13/2018   Procedure: CARDIOVERSION;  Surgeon: [name], MD;  Location: [facility] ENDOSCOPY;  Service: Cardiovascular;  Laterality: N/A;  . CARDIOVERSION N/A 06/23/2018   Procedure: CARDIOVERSION;  Surgeon: [name], MD;  Location: [facility] ENDOSCOPY;  Service: Cardiovascular;  Laterality: N/A;  . CARDIOVERSION N/A 03/11/2020   Procedure: CARDIOVERSION;  Surgeon: [name], MD;  Location: [place];  Service: Cardiovascular;  Laterality: N/A;  . excision of actinic keratosis    . EYE SURGERY     eye lift  . IR CT HEAD LTD  01/27/2020  . IR  PERCUTANEOUS ART THROMBECTOMY/INFUSION INTRACRANIAL INC DIAG ANGIO  01/27/2020      . IR PERCUTANEOUS ART THROMBECTOMY/INFUSION INTRACRANIAL INC DIAG ANGIO  01/27/2020  . RADIOLOGY WITH ANESTHESIA N/A 01/27/2020   Procedure: IR WITH ANESTHESIA;  Surgeon: Radiologist, Medication, MD;  Location: [place];  Service: Radiology;  Laterality: N/A;  . TEE WITHOUT CARDIOVERSION N/A 03/11/2020   Procedure: TRANSESOPHAGEAL ECHOCARDIOGRAM (TEE);  Surgeon: [name], MD;  Location: [place];  Service: Cardiovascular;  Laterality: N/A;  . TONSILLECTOMY    . WISDOM TOOTH EXTRACTION      There were no vitals filed for this visit.   Subjective Assessment - 12/19/20 1534    Subjective No falls. Balance is feeling more steady today. Sees Dr. Posey Pronto tomorrow.    Patient is accompained by: --   gf, Dustin Schneider   Pertinent History PMH: HTN, paroxysmal a fib    Limitations Standing;Walking    Patient Stated Goals "wants everything working on R side again"    Currently in Pain? No/denies                             [facility] Adult PT Treatment/Exercise - 12/19/20 1537      Ambulation/Gait   Ambulation/Gait Yes    Ambulation/Gait Assistance 5: Supervision;4: Min guard    Ambulation/Gait Assistance Details outdoors over paved surfaces with pt needing supervision, able to demonstrate improved step length with LLE throughout gait  today, performed approx 200' of gait over grass surfaces with pt needing min guard for balance. pt with 2 instances of getting R foot caught, pt able to correct on his own with no LOB, ambulates with a step to pattern over grass    Ambulation Distance (Feet) 800 Feet    Assistive device Straight cane   with 6 prong tip   Gait Pattern Step-through pattern;Decreased hip/knee flexion - right    Ambulation Surface Level;Unlevel;Outdoor;Indoor;Paved;Grass      Berg Balance Test   Sit to Stand Able to stand without using hands and stabilize independently    Standing Unsupported Able  to stand safely 2 minutes    Sitting with Back Unsupported but Feet Supported on Floor or Stool Able to sit safely and securely 2 minutes    Stand to Sit Sits safely with minimal use of hands    Transfers Able to transfer safely, minor use of hands    Standing Unsupported with Eyes Closed Able to stand 10 seconds safely    Standing Ubsupported with Feet Together Able to place feet together independently and stand 1 minute safely    From Standing, Reach Forward with Outstretched Arm Can reach confidently >25 cm (10")    From Standing Position, Pick up Object from Floor Able to pick up shoe safely and easily    From Standing Position, Turn to Look Behind Over each Shoulder Looks behind from both sides and weight shifts well    Turn 360 Degrees Needs close supervision or verbal cueing   6.44 seconds to R, close supervision to L   Standing Unsupported, Alternately Place Feet on Step/Stool Able to complete 4 steps without aid or supervision    Standing Unsupported, One Foot in Front Able to plae foot ahead of the other independently and hold 30 seconds    Standing on One Leg Able to lift leg independently and hold > 10 seconds   11 seconds on LLE, can lift standing on RLE   Total Score 50      Knee/Hip Exercises: Aerobic   Other Aerobic Sci-Fit x 7 min level 3.8 with BLE and LUE for strengthening, endurance. while pt on bike - pt's caregiver asking about a bike that pt can use at home, discussed pros/cons of seated stepper and peddle bike for potential home use. can trial at a future session                  PT Education - 12/19/20 1628    Education Details progress towards goals    Person(s) Educated Patient   pt's caregiver   Methods Explanation    Comprehension Verbalized understanding            PT Short Term Goals - 11/14/20 1158      PT SHORT TERM GOAL #1   Title Pt will decrease TUG with cane from 18.52 sec to < 15 sec for improved balance and functional mobility.     Baseline 10/20/20 18.52 sec; 18.45 on 11/14/20    Time 4    Period Weeks    Status Not Met    Target Date 11/19/20      PT SHORT TERM GOAL #2   Title Pt will ambulate with cane with quad tip >600' on level surfaces supervision for improved household and short community distances.    Time 4    Period Weeks    Status New    Target Date 11/19/20      PT SHORT  TERM GOAL #3   Title Pt will increase gait speed from 0.69ms with cane to >0.679m for improved mobility.    Baseline 10/20/20 0.5472mwith cane; 11/14/20; 20.63 seconds = .48 m/s    Time 4    Period Weeks    Status Not Met    Target Date 11/19/20             PT Long Term Goals - 12/19/20 1547      PT LONG TERM GOAL #1   Title Pt will ambulate >800' on varied surfaces with cane supervision for improved community mobility.    Baseline met with supervision over level indoor and paved surfaces, min guard on grass surfaces with cane.    Time 8    Period Weeks    Status Partially Met      PT LONG TERM GOAL #2   Title Pt will increase gait speed with cane to >0.64m63mor improved community mobility.    Baseline 10/20/20 0.22m/31m Time 8    Period Weeks    Status New      PT LONG TERM GOAL #3   Title Pt will increase Berg from 45 to >48/56 for improved balance and decreased fall risk.    Baseline 08/26/20 38/56. 10/20/20 45/56, 50/56 on 12/19/20    Time 8    Period Weeks    Status Achieved      PT LONG TERM GOAL #4   Title Pt will ambulate up/down 2 steps with LRAD supervision for improved access in home to living room    Baseline when performing curb with cane needs min guard/min A at times for R foot clearance, 4 steps with cane min guard ascending, min A descending    Time 8    Period Weeks    Status On-going                Plan - 12/19/20 1631    Clinical Impression Statement Began to assess pt's LTGs today for re-cert at end of this week. Pt met LTG #3 in regards to BERG - improved score to a 50/56 (previously  was a 45/56 approx. 2 months ago). Pt partially met LTG #1 - able to ambulate with supervision with cane over level indoor and paved surfaces outdoors, but does need min guard when ambulating over grass surfaces and pt walking with a step to pattern. Pt is making excellent progress, will assess remainder of LTGs at next session.    Personal Factors and Comorbidities Comorbidity 3+    Comorbidities dense L MCA CVA, paroxysmal A. fib on chronic Xarelto, nonischemic cardiomyopathy and hypertension.    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, works as an art tElectrical engineerving/Moderate complexity    Rehab Potential Good    PT Frequency 2x / week    PT Duration 8 weeks    PT Treatment/Interventions ADLs/Self Care Home Management;Aquatic Therapy;DME Instruction;Gait training;Stair training;Functional mobility training;Therapeutic activities;Therapeutic exercise;Electrical Stimulation;Balance training;Neuromuscular re-education;Wheelchair mobility training;Orthotic Fit/Training;Patient/family education;Passive range of motion;Energy conservation    PT Next Visit Plan goals due this week - perform re-cert. continue with BWS try to help facilitate more right hip/knee flexion. gait outdoors on grass with cane. balance strategies on compliant surfacces. continued R hip flexion strengthening.    PT Home Exercise Plan on 06/28/20 - added standing at countetop and stepping LLE out and in    Consulted and  Agree with Plan of Care Patient           Patient will benefit from skilled therapeutic intervention in order to improve the following deficits and impairments:  Abnormal gait,Decreased activity tolerance,Decreased balance,Decreased cognition,Decreased mobility,Decreased coordination,Decreased  range of motion,Decreased endurance,Decreased strength,Hypomobility,Difficulty walking,Impaired UE functional use,Impaired vision/preception,Postural dysfunction,Impaired sensation  Visit Diagnosis: Muscle weakness (generalized)  Other abnormalities of gait and mobility  Unsteadiness on feet  Other symptoms and signs involving the nervous system     Problem List Patient Active Problem List   Diagnosis Date Noted  . Dysarthria, post-stroke 11/03/2020  . Benign essential HTN 06/09/2020  . Abnormality of gait 04/28/2020  . Neurogenic bladder 03/17/2020  . Sepsis due to pneumonia ([place]) 03/06/2020  . History of stroke with residual deficit 03/06/2020  . Transient hypotension 03/06/2020  . Spastic hemiplegia affecting nondominant side ([place])   . History of hypertension   . Expressive aphasia   . AKI (acute kidney injury) ([place])   . Global aphasia   . Hypoalbuminemia due to protein-calorie malnutrition ([place])   . Dysphagia, post-stroke   . Hyperlipidemia 02/02/2020  . Dysphagia following cerebral infarction 02/02/2020  . Aspiration pneumonia ([place]) 02/02/2020  . Acute ischemic left MCA stroke ([place]) 02/02/2020  . Acute respiratory failure ([place])   . Acute ischemic stroke [facility]) s/p clot retrieval L MCA & ACA A3 01/27/2020  . Aspiration into airway 01/27/2020  . Chronic anticoagulation 01/27/2020  . Paroxysmal atrial fibrillation ([place]) 06/15/2018  . Essential hypertension 06/15/2018  . NICM (nonischemic cardiomyopathy) ([place]) 06/15/2018  . Visit for monitoring Tikosyn therapy 06/12/2018    Arliss Journey, PT, DPT  12/19/2020, 4:32 PM  [facility] [address] [place], [place], [address] Phone: [phone]   Fax:  [phone]  Name: GREEN QUINCY MRN: [id number] Date of Birth: May 01, 1956

## 2020-12-20 ENCOUNTER — Encounter: Payer: Self-pay | Admitting: Physical Medicine & Rehabilitation

## 2020-12-20 ENCOUNTER — Other Ambulatory Visit: Payer: Self-pay

## 2020-12-20 ENCOUNTER — Encounter
Payer: BC Managed Care – PPO | Attending: Physical Medicine & Rehabilitation | Admitting: Physical Medicine & Rehabilitation

## 2020-12-20 VITALS — BP 138/78 | HR 48 | Temp 98.3°F | Ht 66.5 in | Wt 157.4 lb

## 2020-12-20 DIAGNOSIS — I69391 Dysphagia following cerebral infarction: Secondary | ICD-10-CM | POA: Diagnosis not present

## 2020-12-20 DIAGNOSIS — I69322 Dysarthria following cerebral infarction: Secondary | ICD-10-CM | POA: Insufficient documentation

## 2020-12-20 DIAGNOSIS — G8110 Spastic hemiplegia affecting unspecified side: Secondary | ICD-10-CM | POA: Insufficient documentation

## 2020-12-20 DIAGNOSIS — R4701 Aphasia: Secondary | ICD-10-CM | POA: Diagnosis present

## 2020-12-20 DIAGNOSIS — R269 Unspecified abnormalities of gait and mobility: Secondary | ICD-10-CM | POA: Diagnosis present

## 2020-12-20 MED ORDER — BACLOFEN 20 MG PO TABS: 20.0000 mg | ORAL_TABLET | Freq: Three times a day (TID) | ORAL | 3 refills | Status: DC

## 2020-12-20 NOTE — Progress Notes (Addendum)
Subjective:    Patient ID: Dustin Schneider, male    DOB: Jan 26, 1956, 65 y.o.   MRN: [id number]  HPI Male with history of PAF/on Xarelto, and ICM presents for hospital follow up for left MCA infarct.    Last clinic visit on 11/03/20.  Girlfriend supplements history. Since that time, pt decided that he does want Botulinum injection in his leg, however, unable to get authorized by time of visit.  He is still in therapies. He is wearing his orthosis.  He continues to take Baclofen, causing some drowsiness. He had 2 falls, trying to go up stairs both times.   Pain Inventory Average Pain 1 Pain Right Now 1 My pain is right arm & hand spasms.  In the last 24 hours, has pain interfered with the following? General activity 0 Relation with others 0 Enjoyment of life 0 What TIME of day is your pain at its worst? n/a Sleep (in general) Fair  Pain is worse with: no pain Pain improves with: no pain Relief from Meds: no pain  Mobility walk with assistance use a cane ability to climb steps?  no do you drive?  no use a wheelchair needs help with transfers  Function disabled: date disabled 02/02/2020 Do you have any goals in this area?  yes  Neuro/Psych No problems in this area trouble walking  Prior Studies Any changes since last visit?  no  Physicians involved in your care Any changes since last visit?  no   Family History  Problem Relation Age of Onset  . Glaucoma Mother   . Atrial fibrillation Father    Social History   Socioeconomic History  . Marital status: Divorced    Spouse name: Not on file  . Number of children: 1  . Years of education: Not on file  . Highest education level: Not on file  Occupational History  . Not on file  Tobacco Use  . Smoking status: Never Smoker  . Smokeless tobacco: Never Used  Vaping Use  . Vaping Use: Never used  Substance and Sexual Activity  . Alcohol use: Not Currently  . Drug use: No  . Sexual activity: Not on file     Comment: DIVORCE  Other Topics Concern  . 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 BUBBLE STUDY  03/11/2020   Procedure: BUBBLE STUDY;  Surgeon: [name], MD;  Location: [place];  Service: Cardiovascular;;  . CARDIOVERSION N/A 05/13/2018   Procedure: CARDIOVERSION;  Surgeon: [name], MD;  Location: [facility] ENDOSCOPY;  Service: Cardiovascular;  Laterality: N/A;  . CARDIOVERSION N/A 06/13/2018   Procedure: CARDIOVERSION;  Surgeon: [name], MD;  Location: [facility] ENDOSCOPY;  Service: Cardiovascular;  Laterality: N/A;  . CARDIOVERSION N/A 06/23/2018   Procedure: CARDIOVERSION;  Surgeon: [name], MD;  Location: [facility] ENDOSCOPY;  Service: Cardiovascular;  Laterality: N/A;  . CARDIOVERSION N/A 03/11/2020   Procedure: CARDIOVERSION;  Surgeon: [name], MD;  Location: [place];  Service: Cardiovascular;  Laterality: N/A;  . excision of actinic keratosis    . EYE SURGERY     eye lift  .  IR CT HEAD LTD  01/27/2020  . IR PERCUTANEOUS ART THROMBECTOMY/INFUSION INTRACRANIAL INC DIAG ANGIO  01/27/2020      . IR PERCUTANEOUS ART THROMBECTOMY/INFUSION INTRACRANIAL INC DIAG ANGIO  01/27/2020  . RADIOLOGY WITH ANESTHESIA N/A 01/27/2020   Procedure: IR WITH ANESTHESIA;  Surgeon: Radiologist, Medication, MD;  Location: [place];  Service: Radiology;  Laterality: N/A;  . TEE WITHOUT CARDIOVERSION N/A 03/11/2020   Procedure: TRANSESOPHAGEAL ECHOCARDIOGRAM (TEE);  Surgeon: [name], MD;  Location: [place];  Service: Cardiovascular;  Laterality: N/A;  . TONSILLECTOMY    . WISDOM TOOTH EXTRACTION     Past Medical History:  Diagnosis Date  . Hypertension    . NICM (nonischemic cardiomyopathy) ([place]) 06/15/2018   04/23/18: EF 45%, 09/15/18: NOrmal LVEF  . Paroxysmal atrial fibrillation ([place]) 06/15/2018  . Stroke ([place])   . Visit for monitoring Tikosyn therapy 06/12/2018   BP 138/78   Pulse (!) 48   Temp 98.3 F (36.8 C)   Ht 5' 6.5" (1.689 m)   Wt 157 lb 6.4 oz (71.4 kg)   SpO2 98%   BMI 25.02 kg/m   Opioid Risk Score:   Fall Risk Score:  `1  Depression screen PHQ 2/9  Depression screen [facility] 2/9 06/21/2020 06/09/2020 03/17/2020  Decreased Interest 0 0 0  Down, Depressed, Hopeless 0 0 0  PHQ - 2 Score 0 0 0  Altered sleeping - - 1  Tired, decreased energy - - 0  Change in appetite - - 0  Feeling bad or failure about yourself  - - 0  Trouble concentrating - - 1  Moving slowly or fidgety/restless - - 3  Suicidal thoughts - - 0  PHQ-9 Score - - 5  Difficult doing work/chores - - Not difficult at all  Some recent data might be hidden    Review of Systems  Constitutional: Negative.   HENT: Negative.   Eyes: Negative.   Respiratory: Negative.   Cardiovascular: Negative.   Gastrointestinal: Negative.   Endocrine: Negative.   Genitourinary: Negative.   Musculoskeletal: Positive for gait problem.  Skin: Negative.   Allergic/Immunologic: Negative.   Neurological: Positive for tremors, speech difficulty, weakness and numbness.  Hematological: Negative.   Psychiatric/Behavioral: Negative.   All other systems reviewed and are negative.     Objective:   Physical Exam  Constitutional: No distress . Vital signs reviewed. HENT: Normocephalic.  Atraumatic. Eyes: EOMI. No discharge. Cardiovascular: No JVD.   Respiratory: Normal effort.  No stridor.   GI: Non-distended.   Skin: Warm and dry.  Intact. Psych: Normal mood.  Normal behavior. Musc: No edema in extremities.  No tenderness in extremities. Neuro: Alert Expressive aphasia, improving Right facial weakness, improving Motor: RUE: Shoulder abduction 4-/5, elbow ext 3+/5, elbow  flex 2+/5, wrist extension 1+/5, hand grip 1+/5 with apraxia RLE: Hip flexion, knee extension 4-/5, ADF 2-/5 [name]:  Right wrist flexors 2/4, finger flexors 1/4  Right ankle inverters 0/4    Assessment & Plan:  Male with history of PAF/on Xarelto, and ICM presents for hospital follow up for left MCA infarct.    1. Right hemiparesis, now with spasticity, impaired mobility and ADLs secondary to left MCA ischemic stroke  Continue therapies   Continue follow up with Neurology  Continue WHO/PRAFO nightly  Transitioned from Botox to Xeomin:   Right    Flexor Hallucis Longus: 35               Flexor Digitorum Longus: 35  Gastroc Med: 30  Good benefit with injection, patient would like to hold off on injections at this time, may no longer require in RLE.  If patient wishes to pursue would focus on wrist flexors  2.  Post stroke dysarthria  Cont accommodations   Continue therapies  3.  Spasticity  Tizanidine caused lethargy  Continue Baclofen 20 TID  4. Gait abnormality  Continue therapies  Now using cane and navigating stairs

## 2020-12-21 NOTE — Progress Notes (Signed)
Primary Physician/Referring:  Marrian Salvage, FNP  Patient ID: Dustin Schneider, male    DOB: 11/04/1955, 65 y.o.   MRN: [id number]  Chief Complaint  Patient presents with  . Atrial Fibrillation  . Hyperlipidemia    6 MONTHS   HPI:    Dustin Schneider  is a 65 y.o.  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 He was restarted on Tikosyn during his last hospitalization and also underwent TEE cardioversion.    Patient presents for 6 month follow up of atrial fibrillation and hyperlipidemia. No changes were made at his last visit.  Patient is doing well, continues to be compliant with medications and remains relatively asymptomatic.  Denies chest pain, palpitations, syncope, near syncope, dizziness.  Reports no known recurrence of atrial fibrillation.  He is tolerating anticoagulation without bleeding diathesis.  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  Past Medical History:  Diagnosis Date  . Hypertension   . NICM (nonischemic cardiomyopathy) ([place]) 06/15/2018   04/23/18: EF 45%, 09/15/18: NOrmal LVEF  . Paroxysmal atrial fibrillation ([place]) 06/15/2018  . Stroke ([place])   . Visit for monitoring Tikosyn therapy 06/12/2018   Past Surgical History:  Procedure Laterality Date  . BUBBLE STUDY  03/11/2020   Procedure: BUBBLE STUDY;  Surgeon: [name], MD;  Location: [place];  Service: Cardiovascular;;  . CARDIOVERSION N/A 05/13/2018   Procedure: CARDIOVERSION;  Surgeon: [name], MD;  Location: [facility] ENDOSCOPY;  Service:  Cardiovascular;  Laterality: N/A;  . CARDIOVERSION N/A 06/13/2018   Procedure: CARDIOVERSION;  Surgeon: [name], MD;  Location: [facility] ENDOSCOPY;  Service: Cardiovascular;  Laterality: N/A;  . CARDIOVERSION N/A 06/23/2018   Procedure: CARDIOVERSION;  Surgeon: [name], MD;  Location: [facility] ENDOSCOPY;  Service: Cardiovascular;  Laterality: N/A;  . CARDIOVERSION N/A 03/11/2020   Procedure: CARDIOVERSION;  Surgeon: [name], MD;  Location: [place];  Service: Cardiovascular;  Laterality: N/A;  . excision of actinic keratosis    . EYE SURGERY     eye lift  . IR CT HEAD LTD  01/27/2020  . IR PERCUTANEOUS ART THROMBECTOMY/INFUSION INTRACRANIAL INC DIAG ANGIO  01/27/2020      . IR PERCUTANEOUS ART THROMBECTOMY/INFUSION INTRACRANIAL INC DIAG ANGIO  01/27/2020  . RADIOLOGY WITH ANESTHESIA N/A 01/27/2020   Procedure: IR WITH ANESTHESIA;  Surgeon: Radiologist, Medication, MD;  Location: [place];  Service: Radiology;  Laterality: N/A;  . TEE WITHOUT CARDIOVERSION N/A 03/11/2020   Procedure: TRANSESOPHAGEAL ECHOCARDIOGRAM (TEE);  Surgeon: [name], MD;  Location: [place];  Service: Cardiovascular;  Laterality: N/A;  . TONSILLECTOMY    . WISDOM TOOTH EXTRACTION     Family History  Problem Relation Age of Onset  . Glaucoma Mother   . Atrial fibrillation Father     Social History   Tobacco Use  . Smoking status: Never Smoker  . Smokeless tobacco: Never Used  Substance Use Topics  . Alcohol use: Not Currently   Marital Status: Divorced  ROS  Review of Systems  Constitutional: Negative for malaise/fatigue and weight loss.  Respiratory: Negative for cough, hemoptysis and shortness of breath.   Cardiovascular: Negative  for chest pain, palpitations, claudication and leg swelling.  Gastrointestinal: Negative for blood in stool, heartburn and melena.  Genitourinary: Negative for dysuria.  Musculoskeletal: Negative for joint pain and myalgias.  Neurological: Positive for speech change,  focal weakness and weakness. Negative for dizziness, seizures and headaches.  Endo/Heme/Allergies: Does not bruise/bleed easily.  Psychiatric/Behavioral: Negative for depression. The patient is not nervous/anxious.   All other systems reviewed and are negative.  Objective  Blood pressure 133/79, pulse 66, temperature 98.3 F (36.8 C), temperature source Temporal, resp. rate 17, height 5' 6.5" (1.689 m), weight 155 lb 6.4 oz (70.5 kg), SpO2 97 %.  Vitals with BMI 12/23/2020 12/20/2020 12/16/2020  Height 5' 6.5" 5' 6.5" -  Weight 155 lbs 6 oz 157 lbs 6 oz -  BMI 0000000 99991111 -  Systolic Q000111Q 0000000 [id number]  Diastolic 79 78 77  Pulse 66 48 53   Physical Exam Vitals reviewed.  HENT:     Head: Normocephalic and atraumatic.  Cardiovascular:     Rate and Rhythm: Normal rate and regular rhythm.     Pulses: Intact distal pulses.     Heart sounds: Normal heart sounds, S1 normal and S2 normal. No murmur heard. No gallop.      Comments: No leg edema, no JVD. Pulmonary:     Effort: Pulmonary effort is normal. No respiratory distress.     Breath sounds: Normal breath sounds. No wheezing, rhonchi or rales.  Musculoskeletal:     Right lower leg: No edema.     Left lower leg: No edema.  Neurological:     Mental Status: He is alert.     Gait: Gait abnormal (right spastic hemiplegia).     Laboratory examination:   Recent Labs    03/09/20 0656 03/10/20 0421 03/11/20 0138  NA 138 137 139  K 3.8 4.5 4.1  CL 102 104 103  CO2 25 24 26   GLUCOSE 116* 107* 111*  BUN 10 13 12   CREATININE 0.79 0.90 0.85  CALCIUM 8.3* 8.4* 8.6*  GFRNONAA >60 >60 >60  GFRAA >60 >60 >60   CrCl cannot be calculated (Patient's most recent lab result is older than the maximum 21 days allowed.).  CMP Latest Ref Rng & Units 03/11/2020 03/10/2020 03/09/2020  Glucose 70 - 99 mg/dL 111(H) 107(H) 116(H)  BUN 8 - 23 mg/dL 12 13 10   Creatinine 0.61 - 1.24 mg/dL 0.85 0.90 0.79  Sodium 135 - 145 mmol/L 139 137 138  Potassium 3.5 -  5.1 mmol/L 4.1 4.5 3.8  Chloride 98 - 111 mmol/L 103 104 102  CO2 22 - 32 mmol/L 26 24 25   Calcium 8.9 - 10.3 mg/dL 8.6(L) 8.4(L) 8.3(L)  Total Protein 6.5 - 8.1 g/dL - - -  Total Bilirubin 0.3 - 1.2 mg/dL - - -  Alkaline Phos 38 - 126 U/L - - -  AST 15 - 41 U/L - - -  ALT 0 - 44 U/L - - -   CBC Latest Ref Rng & Units 03/09/2020 03/07/2020 03/06/2020  WBC 4.0 - 10.5 K/uL 5.7 9.2 12.4(H)  Hemoglobin 13.0 - 17.0 g/dL 11.3(L) 11.3(L) 13.2  Hematocrit 39.0 - 52.0 % 34.7(L) 34.8(L) 40.6  Platelets 150 - 400 K/uL 234 180 201   Lipid Panel Recent Labs    01/28/20 0716  CHOL 125  TRIG 298*  LDLCALC 33  VLDL 60*  HDL 32*  CHOLHDL 3.9    HEMOGLOBIN A1C Lab Results  Component Value Date   HGBA1C 5.6 01/28/2020  MPG 114 01/28/2020     TSH No results for input(s): TSH in the last 8760 hours. Medications and allergies  No Known Allergies   Current Outpatient Medications on File Prior to Visit  Medication Sig Dispense Refill  . apixaban (ELIQUIS) 5 MG TABS tablet Take 1 tablet (5 mg total) by mouth 2 (two) times daily. 60 tablet 6  . baclofen (LIORESAL) 20 MG tablet Take 1 tablet (20 mg total) by mouth 3 (three) times daily. 90 tablet 3  . diltiazem (CARDIZEM CD) 180 MG 24 hr capsule TAKE (1) CAPSULE DAILY. 30 capsule 6  . dofetilide (TIKOSYN) 500 MCG capsule Take 1 capsule (500 mcg total) by mouth 2 (two) times daily. 180 capsule 2  . metoprolol tartrate (LOPRESSOR) 50 MG tablet Take 1 tablet (50 mg total) by mouth 2 (two) times daily. 180 tablet 2  . rosuvastatin (CRESTOR) 10 MG tablet TAKE 1 TABLET BY MOUTH DAILY AFTER SUPPER 30 tablet 6   No current facility-administered medications on file prior to visit.    Radiology:   No results found.  Cardiac Studies:   Nuclear stress test  05/05/2018: 1. The resting electrocardiogram demonstrated atrial fibrillation. The stress electrocardiogram was positive for ischemia with 2 mm downsloping ST depression in the inferior and  lateral leads, persisted for 2 mintues into recovery. Stress symptoms included palpitations and fatigue. Patient exercised on Bruce protocol for 6:00 minutes and achieved 7.05 METS. Stress test terminated due to fatigue and 113% MPHR achieved (Target HR >85%). 2. Left ventricular cavity is noted to be normal on the rest and stress studies. SPECT images demonstrate homogeneous tracer distribution throughout the myocardium. The left ventricular ejection fraction was calculated to be 38%, but visually appears to be at least low normal without wall motion abnormality. This is an intermediate risk study due to abnormal ST change and low LVEF (difficult in view of gating with A. Fib), clinical correlation recommended.  Echocardiogram 01/28/2020: 1.The left ventricle has normal function. The left ventricle has no regional wall motion abnormalities. Left ventricular diastolic parameters are  consistent with Grade II diastolic dysfunction (pseudonormalization).  2. Right ventricular systolic function is normal. The right ventricular size is normal. There is mildly elevated pulmonary artery systolic pressure.  3. Left atrial size was moderately dilated.  4. Right atrial size was moderately dilated.  5. The mitral valve is normal in structure. Mild mitral valve regurgitation.  6. The aortic valve is normal in structure. Aortic valve regurgitation is mild to moderate.  7. The inferior vena cava is dilated in size with >50% respiratory variability, suggesting right atrial pressure of 8 mmHg.  TEE Echocardiogram 03/11/2020:  1. Left ventricular ejection fraction, by estimation, is 60 to 65%. The left ventricle has normal function. The left ventricle has no regional wall motion abnormalities.  2. Right ventricular systolic function is normal. The right ventricular size is normal.  3. No left atrial/left atrial appendage thrombus was detected.  4. The mitral valve is normal in structure. Mild mitral  valve regurgitation.  5. The aortic valve is tricuspid. Aortic valve regurgitation is mild.  6. Agitated saline contrast bubble study was negative, with no evidence of any interatrial shunt.   EKG   EKG 12/23/2020: Sinus bradycardia rate of 56 bpm.  Normal axis.  Nonspecific T wave abnormality.  No evidence of ischemia or underlying injury pattern.  EKG 06/22/2020: Sinus bradycardia at rate of 50 bpm, normal axis, otherwise normal EKG.  No significant change from 03/23/2019: Marked sinus  bradycardia with rate of 54 bpm.  Assessment     ICD-10-CM   1. Paroxysmal atrial fibrillation (HCC)  I48.0 EKG 12-Lead    TSH  2. Pure hypercholesterolemia  E78.00 Lipid Panel With LDL/HDL Ratio  3. History of left MCA/ACA stroke with clot removal and right-sided residual hemiparesis and expressive aphasia  Z86.73   4. Essential hypertension  Y09 Basic metabolic panel  5. Long term current use of antiarrhythmic drug  Z79.899 CBC  6. Long term (current) use of anticoagulants  Z79.01      No orders of the defined types were placed in this encounter.   There are no discontinued medications.   This patients CHA2DS2-VASc Score 4 (Age, stroke, HTN) and yearly risk of stroke 4.8%.   Recommendations:   Dustin Schneider  is a 65 y.o. 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 He was restarted on Tikosyn during his last hospitalization and also underwent TEE cardioversion.    Patient presents for 6 month follow up of atrial fibrillation and hyperlipidemia. No changes were made at his last visit.  Patient remains compliant with his  medications and has had no known recurrence of atrial fibrillation.  He is tolerating anticoagulation without bleeding diathesis, will continue this.  He has maintaining sinus rhythm with normal QT interval, on any changes to medications at this time.  Patient does not have recent lipid profile testing done, and states he does not have upcoming appointment with PCP.  Will order repeat lipid profile testing as well as basic labs.  Otherwise patient is stable from a cardiovascular standpoint.  Follow-up in 6 months, sooner if needed, for paroxysmal atrial fibrillation, hyperlipidemia, and chronic diastolic heart failure.   Alethia Berthold, PA-C 12/23/2020, 3:20 PM Office: [phone]

## 2020-12-23 ENCOUNTER — Ambulatory Visit: Payer: BC Managed Care – PPO | Admitting: Physical Therapy

## 2020-12-23 ENCOUNTER — Encounter: Payer: Self-pay | Admitting: Physical Therapy

## 2020-12-23 ENCOUNTER — Ambulatory Visit: Payer: BC Managed Care – PPO | Admitting: Student

## 2020-12-23 ENCOUNTER — Encounter: Payer: Self-pay | Admitting: Student

## 2020-12-23 ENCOUNTER — Other Ambulatory Visit: Payer: Self-pay

## 2020-12-23 VITALS — BP 133/79 | HR 66 | Temp 98.3°F | Resp 17 | Ht 66.5 in | Wt 155.4 lb

## 2020-12-23 DIAGNOSIS — Z8673 Personal history of transient ischemic attack (TIA), and cerebral infarction without residual deficits: Secondary | ICD-10-CM

## 2020-12-23 DIAGNOSIS — Z7901 Long term (current) use of anticoagulants: Secondary | ICD-10-CM

## 2020-12-23 DIAGNOSIS — R2681 Unsteadiness on feet: Secondary | ICD-10-CM

## 2020-12-23 DIAGNOSIS — M6281 Muscle weakness (generalized): Secondary | ICD-10-CM

## 2020-12-23 DIAGNOSIS — I480 Paroxysmal atrial fibrillation: Secondary | ICD-10-CM

## 2020-12-23 DIAGNOSIS — Z79899 Other long term (current) drug therapy: Secondary | ICD-10-CM

## 2020-12-23 DIAGNOSIS — R2689 Other abnormalities of gait and mobility: Secondary | ICD-10-CM

## 2020-12-23 DIAGNOSIS — E7800 Pure hypercholesterolemia, unspecified: Secondary | ICD-10-CM

## 2020-12-23 DIAGNOSIS — R29818 Other symptoms and signs involving the nervous system: Secondary | ICD-10-CM

## 2020-12-23 DIAGNOSIS — I10 Essential (primary) hypertension: Secondary | ICD-10-CM

## 2020-12-23 NOTE — Therapy (Addendum)
[facility] [address] [place], [place], [address] Phone: [phone]   Fax:  [phone]  Physical Therapy Treatment/Re-Cert  Patient Details  Name: Dustin Schneider MRN: [id number] Date of Birth: [birth year] Referring Provider (PT): Lauraine Rinne, PA-C but followed by Delice Lesch   Encounter Date: 12/23/2020   PT End of Session - 12/23/20 2135    Visit Number 40    Number of Visits 84    Authorization Type BCBS    PT Start Time 1532    PT Stop Time 1615    PT Time Calculation (min) 43 min    Equipment Utilized During Treatment Gait belt    Activity Tolerance Patient tolerated treatment well    Behavior During Therapy WFL for tasks assessed/performed           Past Medical History:  Diagnosis Date  . Hypertension   . NICM (nonischemic cardiomyopathy) ([place]) 06/15/2018   04/23/18: EF 45%, 09/15/18: NOrmal LVEF  . Paroxysmal atrial fibrillation ([place]) 06/15/2018  . Stroke ([place])   . Visit for monitoring Tikosyn therapy 06/12/2018    Past Surgical History:  Procedure Laterality Date  . BUBBLE STUDY  03/11/2020   Procedure: BUBBLE STUDY;  Surgeon: [name], MD;  Location: [place];  Service: Cardiovascular;;  . CARDIOVERSION N/A 05/13/2018   Procedure: CARDIOVERSION;  Surgeon: [name], MD;  Location: [facility] ENDOSCOPY;  Service: Cardiovascular;  Laterality: N/A;  . CARDIOVERSION N/A 06/13/2018   Procedure: CARDIOVERSION;  Surgeon: [name], MD;  Location: [facility] ENDOSCOPY;  Service: Cardiovascular;  Laterality: N/A;  . CARDIOVERSION N/A 06/23/2018   Procedure: CARDIOVERSION;  Surgeon: [name], MD;  Location: [facility] ENDOSCOPY;  Service: Cardiovascular;  Laterality: N/A;  . CARDIOVERSION N/A 03/11/2020   Procedure: CARDIOVERSION;  Surgeon: [name], MD;  Location: [place];  Service: Cardiovascular;  Laterality: N/A;  . excision of actinic keratosis    . EYE SURGERY     eye lift  . IR CT HEAD LTD   01/27/2020  . IR PERCUTANEOUS ART THROMBECTOMY/INFUSION INTRACRANIAL INC DIAG ANGIO  01/27/2020      . IR PERCUTANEOUS ART THROMBECTOMY/INFUSION INTRACRANIAL INC DIAG ANGIO  01/27/2020  . RADIOLOGY WITH ANESTHESIA N/A 01/27/2020   Procedure: IR WITH ANESTHESIA;  Surgeon: Radiologist, Medication, MD;  Location: [place];  Service: Radiology;  Laterality: N/A;  . TEE WITHOUT CARDIOVERSION N/A 03/11/2020   Procedure: TRANSESOPHAGEAL ECHOCARDIOGRAM (TEE);  Surgeon: [name], MD;  Location: [place];  Service: Cardiovascular;  Laterality: N/A;  . TONSILLECTOMY    . WISDOM TOOTH EXTRACTION      There were no vitals filed for this visit.   Subjective Assessment - 12/23/20 1536    Subjective No falls. Did not want to have the grab bar installed in his pillar at home when doing his stairs. Saw Dr. Posey Pronto earlier this week - did not get Botox to RLE.    Patient is accompained by: --   gf, [name]   Pertinent History PMH: HTN, paroxysmal a fib    Limitations Standing;Walking    Patient Stated Goals "wants everything working on R side again"    Currently in Pain? No/denies              [facility] PT Assessment - 12/23/20 1543      Assessment   Medical Diagnosis L MCA CVA    Referring Provider (PT) Lauraine Rinne, PA-C but followed by Delice Lesch    Onset Date/Surgical Date 01/27/20    Hand  Dominance Right      Prior Function   Level of Independence Independent      High Level Balance   High Level Balance Comments mCTSIB: conditions 1-3: 30 seconds, condition 4: 4 seconds                  12/23/20 1543  Ambulation/Gait  Ambulation/Gait Yes  Ambulation/Gait Assistance 5: Supervision  Ambulation/Gait Assistance Details clinic distances in session, pt able to consistently take incr step length with LLE. Performed an additional x115' indoors with pt scanning environment with supervision and pt having no LOB.   Assistive device Straight cane (with 6 prong tip)  Gait Pattern Step-through  pattern;Decreased hip/knee flexion - right  Ambulation Surface Level;Indoor  Gait velocity 18.8 seconds = 1.74 ft/sec or .53 m/s  Stairs Yes  Stairs Assistance 5: Supervision  Stairs Assistance Details (indicate cue type and reason) with SPC with 6 prong tip  Stair Management Technique Step to pattern;With cane  Number of Stairs 8  Height of Stairs 6  Timed Up and Go Test  TUG Normal TUG  Normal TUG (seconds) 17.4 (with SPC with 6 prong tip)  High Level Balance  High Level Balance Comments mCTSIB: conditions 1-3: 30 seconds, condition 4: 4 seconds  Therapeutic Activites   Therapeutic Activities Other Therapeutic Activities  Other Therapeutic Activities trialed seated stepper and seated recumbent bike for pt to try at home - pt unable to press down with RLE into seated stepper, with seated peddle bike - shoe unable to fit in, when tried with AFO, kept slipping out, pt didn't want to try with just his sock on. pt reports that he really enjoys the SciFit - discussed with pt and caregiver could potentially look into getting a membership at a gym for pt to use a SciFit machine outside of therapy for aerobic activity. discussed progress towards goals, areas to continue to work on in therapy, and POC going forwards (will schedule out for 8 weeks, may or may not need all 8 weeks)  Knee/Hip Exercises: Aerobic  Other Aerobic Sci-Fit x 8 min level 4.0 with BLE and LUE for strengthening, endurance.         PT Short Term Goals - 11/14/20 1158      PT SHORT TERM GOAL #1   Title Pt will decrease TUG with cane from 18.52 sec to < 15 sec for improved balance and functional mobility.    Baseline 10/20/20 18.52 sec; 18.45 on 11/14/20    Time 4    Period Weeks    Status Not Met    Target Date 11/19/20      PT SHORT TERM GOAL #2   Title Pt will ambulate with cane with quad tip >600' on level surfaces supervision for improved household and short community distances.    Time 4    Period Weeks    Status  New    Target Date 11/19/20      PT SHORT TERM GOAL #3   Title Pt will increase gait speed from 0.66ms with cane to >0.639m for improved mobility.    Baseline 10/20/20 0.5473mwith cane; 11/14/20; 20.63 seconds = .48 m/s    Time 4    Period Weeks    Status Not Met    Target Date 11/19/20          Revised STGs:  PT Short Term Goals - 12/25/20 2109      PT SHORT TERM GOAL #1   Title Pt will decrease  TUG with cane from 17.4 sec to < 16 sec for improved balance and functional mobility. ALL STGS DUE 01/22/21    Baseline 17.4 seconds    Time 4    Period Weeks    Status New    Target Date 01/22/21      PT SHORT TERM GOAL #2   Title Pt  will undergo further assessment of 6MWT with LTG to be written as appropriate.    Time 4    Period Weeks    Status New      PT SHORT TERM GOAL #3   Title Pt will increase gait speed from 0.53 m/s with cane to >0.68ms for improved mobility.    Baseline .53 m/s  or 18.8 seconds    Time 4    Period Weeks    Status New      PT SHORT TERM GOAL #4   Title Pt will ambulate at least 50' over outdoor grass surfaces with cane and supervision in order to demo improved safety ambulating in his yard.    Baseline needs min guard.    Time 4    Period Weeks    Status New             PT Long Term Goals - 12/23/20 1545      PT LONG TERM GOAL #1   Title Pt will ambulate >800' on varied surfaces with cane supervision for improved community mobility.    Baseline met with supervision over level indoor and paved surfaces, min guard on grass surfaces with cane.    Time 8    Period Weeks    Status Partially Met      PT LONG TERM GOAL #2   Title Pt will increase gait speed with cane to >0.774m for improved community mobility.    Baseline 10/20/20 0.5464m 18.8 seconds = 1.74 ft/sec or .53 m/s    Time 8    Period Weeks    Status Not Met      PT LONG TERM GOAL #3   Title Pt will increase Berg from 45 to >48/56 for improved balance and decreased fall  risk.    Baseline 08/26/20 38/56. 10/20/20 45/56, 50/56 on 12/19/20    Time 8    Period Weeks    Status Achieved      PT LONG TERM GOAL #4   Title Pt will ambulate up/down 2 steps with LRAD supervision for improved access in home to living room    Baseline met with cane    Time 8    Period Weeks    Status Achieved          Revised goals:     PT Long Term Goals - 12/23/20 1545      PT LONG TERM GOAL #1   Title Pt will ambulate at least 200' over grass, gravel, and mulch surfaces with cane and supervision in order to demo improved community mobility on unlevel surfaces. ALL LTGS DUE 02/19/21    Baseline --    Time 8    Period Weeks    Status New    Target Date 02/19/21      PT LONG TERM GOAL #2   Title 6MWT goal to be written as appropriate in order to demo improved gait endurance    Baseline --    Time 8    Period Weeks    Status New      PT LONG TERM GOAL #3   Title Pt will  perform fall recovery with supervision (and possible set up assist for RLE to come to stand) in order to demo improved safety with transfers if pt has a fall.    Baseline --    Time 8    Period Weeks    Status New      PT LONG TERM GOAL #4   Title Pt will ambulate up/down 4 steps with LRAD and mod I for improved access in home to living room    Baseline with cane and supervision    Time 8    Period Weeks    Status New      PT LONG TERM GOAL #5   Title Pt will improve condition 4 of mCTSIB to at least 10 seconds in order to demo improved vestibular input for balance.    Baseline 4 seconds    Time 8    Period Weeks    Status New               12/25/20 2059  Plan  Clinical Impression Statement Assessed remainder of LTGs today. Pt met LTG #4 - able to perform 4 steps with use of SPC with 6 prong tip with supervision. Pt did not meet LTG #2, pt's gait speed today was 1.74 ft/sec or .53 m/s, however improved from last assessed at beginning of april at .48 m/s. Also assessed mCTSIB for balance  with more narrow BOS/unlevel surfaces, pt able to perform conditions 1-3 at 30 seconds and contion 4 at 4 seconds, indicating decr vestibular input for balance. Pt able to progress ambulating outdoors with cane over pavement with supervision, but does need min guard on more unlevel surfaces. Pt is continuing to make progress with PT. Will re-cert for an additional 2x week for 8 weeks (full 8 weeks may not be needed in [place]) to continue to address balance, gait, strength, and functional mobility in order to decr pt's fall risk and improve functional independence. STGs/LTGs revised and updated as appropriate.  Personal Factors and Comorbidities Comorbidity 3+  Comorbidities dense L MCA CVA, paroxysmal A. fib on chronic Xarelto, nonischemic cardiomyopathy and hypertension.  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, works as an Gaffer)  Pt will benefit from skilled therapeutic intervention in order to improve on the following deficits Abnormal gait;Decreased activity tolerance;Decreased balance;Decreased cognition;Decreased mobility;Decreased coordination;Decreased range of motion;Decreased endurance;Decreased strength;Hypomobility;Difficulty walking;Impaired UE functional use;Impaired vision/preception;Postural dysfunction;Impaired sensation  Stability/Clinical Decision Making Evolving/Moderate complexity  Rehab Potential Good  PT Frequency 2x / week  PT Duration 8 weeks  PT Treatment/Interventions ADLs/Self Care Home Management;Aquatic Therapy;DME Instruction;Gait training;Stair training;Functional mobility training;Therapeutic activities;Therapeutic exercise;Electrical Stimulation;Balance training;Neuromuscular re-education;Wheelchair mobility training;Orthotic Fit/Training;Patient/family education;Passive range of  motion;Energy conservation  PT Next Visit Plan Perform 6MWT for gait endurance and LTG written. continue with BWS for incr hip flexion during gait. gait outdoors on grass/gravel with cane. balance strategies on compliant surfacces. activites to for incr stance time on RLE and getting more hip extension.  PT Home Exercise Plan on 06/28/20 - added standing at [place] and stepping LLE out and in  Consulted and Agree with Plan of Care Patient        Patient will benefit from skilled therapeutic intervention in order to improve the following deficits and impairments:     Visit Diagnosis: Muscle weakness (generalized)  Other abnormalities of gait and mobility  Unsteadiness on feet  Other symptoms and signs involving the nervous system     Problem List Patient  Active Problem List   Diagnosis Date Noted  . Dysarthria, post-stroke 11/03/2020  . Benign essential HTN 06/09/2020  . Abnormality of gait 04/28/2020  . Neurogenic bladder 03/17/2020  . Sepsis due to pneumonia ([place]) 03/06/2020  . History of stroke with residual deficit 03/06/2020  . Transient hypotension 03/06/2020  . Spastic hemiplegia affecting nondominant side ([place])   . History of hypertension   . Expressive aphasia   . AKI (acute kidney injury) ([place])   . Global aphasia   . Hypoalbuminemia due to protein-calorie malnutrition ( [place])   . Dysphagia, post-stroke   . Hyperlipidemia 02/02/2020  . Dysphagia following cerebral infarction 02/02/2020  . Aspiration pneumonia ([place]) 02/02/2020  . Acute ischemic left MCA stroke ([place]) 02/02/2020  . Acute respiratory failure ([place])   . Acute ischemic stroke [facility]) s/p clot retrieval L MCA & ACA A3 01/27/2020  . Aspiration into airway 01/27/2020  . Chronic anticoagulation 01/27/2020  . Paroxysmal atrial fibrillation ([place]) 06/15/2018  . Essential hypertension 06/15/2018  . NICM (nonischemic cardiomyopathy) ([place]) 06/15/2018  . Visit for monitoring Tikosyn therapy 06/12/2018     Arliss Journey , PT, DPT  12/23/2020, 9:36 PM  [facility] [address] [place], [place], [address] Phone: [phone]   Fax:  [phone]  Name: Dustin Schneider MRN: [id number] Date of Birth: [birth year]

## 2020-12-25 NOTE — Addendum Note (Signed)
Addended by: Arliss Journey on: 12/25/2020 09:19 PM   Modules accepted: Orders

## 2020-12-26 ENCOUNTER — Other Ambulatory Visit: Payer: Self-pay

## 2020-12-26 ENCOUNTER — Ambulatory Visit: Payer: BC Managed Care – PPO

## 2020-12-26 DIAGNOSIS — M6281 Muscle weakness (generalized): Secondary | ICD-10-CM

## 2020-12-26 DIAGNOSIS — R2689 Other abnormalities of gait and mobility: Secondary | ICD-10-CM

## 2020-12-26 DIAGNOSIS — I69351 Hemiplegia and hemiparesis following cerebral infarction affecting right dominant side: Secondary | ICD-10-CM

## 2020-12-26 NOTE — Therapy (Signed)
[facility] [address] [place], [place], [address] Phone: [phone]   Fax:  [phone]  Physical Therapy Treatment  Patient Details  Name: Dustin Schneider MRN: [id number] Date of Birth: Feb 18, 1956 Referring Provider (PT): Lauraine Rinne, PA-C but followed by Delice Lesch   Encounter Date: 12/26/2020   PT End of Session - 12/26/20 1317    Visit Number 17    Number of Visits 70    Authorization Type BCBS    PT Start Time 0086    PT Stop Time 1400    PT Time Calculation (min) 45 min    Equipment Utilized During Treatment Gait belt    Activity Tolerance Patient tolerated treatment well    Behavior During Therapy WFL for tasks assessed/performed           Past Medical History:  Diagnosis Date  . Hypertension   . NICM (nonischemic cardiomyopathy) ([place]) 06/15/2018   04/23/18: EF 45%, 09/15/18: NOrmal LVEF  . Paroxysmal atrial fibrillation ([place]) 06/15/2018  . Stroke ([place])   . Visit for monitoring Tikosyn therapy 06/12/2018    Past Surgical History:  Procedure Laterality Date  . BUBBLE STUDY  03/11/2020   Procedure: BUBBLE STUDY;  Surgeon: [name], MD;  Location: [facility];  Service: Cardiovascular;;  . CARDIOVERSION N/A 05/13/2018   Procedure: CARDIOVERSION;  Surgeon: [name], MD;  Location: [facility] ENDOSCOPY;  Service: Cardiovascular;  Laterality: N/A;  . CARDIOVERSION N/A 06/13/2018   Procedure: CARDIOVERSION;  Surgeon: [name], MD;  Location: [facility] ENDOSCOPY;  Service: Cardiovascular;  Laterality: N/A;  . CARDIOVERSION N/A 06/23/2018   Procedure: CARDIOVERSION;  Surgeon: [name], MD;  Location: [facility] ENDOSCOPY;  Service: Cardiovascular;  Laterality: N/A;  . CARDIOVERSION N/A 03/11/2020   Procedure: CARDIOVERSION;  Surgeon: [name], MD;  Location: [facility];  Service: Cardiovascular;  Laterality: N/A;  . excision of actinic keratosis    . EYE SURGERY     eye lift  . IR CT HEAD LTD  01/27/2020  .  IR PERCUTANEOUS ART THROMBECTOMY/INFUSION INTRACRANIAL INC DIAG ANGIO  01/27/2020      . IR PERCUTANEOUS ART THROMBECTOMY/INFUSION INTRACRANIAL INC DIAG ANGIO  01/27/2020  . RADIOLOGY WITH ANESTHESIA N/A 01/27/2020   Procedure: IR WITH ANESTHESIA;  Surgeon: Radiologist, Medication, MD;  Location: [facility];  Service: Radiology;  Laterality: N/A;  . TEE WITHOUT CARDIOVERSION N/A 03/11/2020   Procedure: TRANSESOPHAGEAL ECHOCARDIOGRAM (TEE);  Surgeon: [name], MD;  Location: [facility];  Service: Cardiovascular;  Laterality: N/A;  . TONSILLECTOMY    . WISDOM TOOTH EXTRACTION      There were no vitals filed for this visit.   Subjective Assessment - 12/26/20 1318    Subjective Pt denies any new falls.    Patient is accompained by: --   gf, [name]   Pertinent History PMH: HTN, paroxysmal a fib    Limitations Standing;Walking    Patient Stated Goals "wants everything working on R side again"    Currently in Pain? No/denies              [facility] PT Assessment - 12/26/20 1319      6 Minute Walk- Baseline   6 Minute Walk- Baseline yes    HR (bpm) 58    Modified Borg Scale for Dyspnea 0- Nothing at all      6 Minute walk- Post Test   6 Minute Walk Post Test yes    HR (bpm) 62    Modified Borg Scale for Dyspnea  3- Moderate shortness of breath or breathing difficulty      6 minute walk test results    Aerobic Endurance Distance Walked 540                         [facility] Adult PT Treatment/Exercise - 12/26/20 1319      Ambulation/Gait   Ambulation/Gait Yes    Ambulation/Gait Assistance 5: Supervision;4: Min guard    Ambulation/Gait Assistance Details Pt ambulated 540' during 6 min walk as noted. Then walked outside in grass 20' x 8 and over gravel 10' x 4 CGA with decreased step length. Gait at end of session working on increasing right stance time with tactile cues to prevent right hip from sticking out and decrease left lateral trunk lean trying to increase right stance time.     Ambulation Distance (Feet) 540 Feet    Assistive device Straight cane   with quad tip and right AFO   Gait Pattern Step-through pattern;Decreased hip/knee flexion - right;Decreased stance time - right    Ambulation Surface Level;Unlevel;Indoor;Outdoor;Paved;Gravel;Grass      Neuro Re-ed    Neuro Re-ed Details  Stepping to right to crouch down and pick up bean bag off stool with right hand with PT assisting at hand to grip then come back up and placing bean bag in basket to left side x 14, then repeated with stepping back with right prior to moving bean bag across body x 8. Stepping forward with LLE to tap therastone with PT facilitating right weight shift 6 x 2 with cane support first bout and then no UE support second bout. Pt challenged more without use of cane.                    PT Short Term Goals - 12/26/20 2009      PT SHORT TERM GOAL #1   Title Pt will decrease TUG with cane from 17.4 sec to < 16 sec for improved balance and functional mobility. ALL STGS DUE 01/22/21    Baseline 17.4 seconds    Time 4    Period Weeks    Status New    Target Date 01/22/21      PT SHORT TERM GOAL #2   Title Pt  will undergo further assessment of with LTG to be written as appropriate.    Baseline 12/26/20 6 MWT completed    Time 4    Period Weeks    Status Achieved      PT SHORT TERM GOAL #3   Title Pt will increase gait speed from 0.53 m/s with cane to >0.53m/s for improved mobility.    Baseline .53 m/s  or 18.8 seconds    Time 4    Period Weeks    Status New      PT SHORT TERM GOAL #4   Title Pt will ambulate at least 50' over outdoor grass surfaces with cane and supervision in order to demo improved safety ambulating in his yard.    Baseline needs min guard.    Time 4    Period Weeks    Status New             PT Long Term Goals - 12/26/20 2010      PT LONG TERM GOAL #1   Title Pt will ambulate at least 200' over grass, gravel, and mulch surfaces with cane and  supervision in order to demo improved community mobility on unlevel  surfaces. ALL LTGS DUE 02/19/21    Time 8    Period Weeks    Status New      PT LONG TERM GOAL #2   Title Pt will increase 6 min walk from 540' to >650' to demo improved gait endurance.    Baseline 12/26/20 540'    Time 8    Period Weeks    Status New      PT LONG TERM GOAL #3   Title Pt will perform fall recovery with supervision (and possible set up assist for RLE to come to stand) in order to demo improved safety with transfers if pt has a fall.    Time 8    Period Weeks    Status New      PT LONG TERM GOAL #4   Title Pt will ambulate up/down 4 steps with LRAD and mod I for improved access in home to living room    Baseline with cane and supervision    Time 8    Period Weeks    Status New      PT LONG TERM GOAL #5   Title Pt will improve condition 4 of mCTSIB to at least 10 seconds in order to demo improved vestibular input for balance.    Baseline 4 seconds    Time 8    Period Weeks    Status New                 Plan - 12/26/20 2011    Clinical Impression Statement 6MWT test completed and LTG set. [name] right leg did fatigue some with longer gait distance. Continued to work on gait on varied surfaces and improving right stance time today with NMR activities.    Personal Factors and Comorbidities Comorbidity 3+    Comorbidities dense L MCA CVA, paroxysmal A. fib on chronic Xarelto, nonischemic cardiomyopathy and hypertension.    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 gait outdoors on grass/gravel with cane. balance strategies on compliant surfacces. activites to for incr stance time on RLE and getting more hip extension.    PT Home Exercise Plan on 06/28/20 - added standing at countetop and stepping LLE out and in    Consulted and Agree with Plan of Care Patient           Patient will benefit from skilled therapeutic intervention in order to improve the following deficits and impairments:  Abnormal gait,Decreased activity tolerance,Decreased balance,Decreased cognition,Decreased mobility,Decreased coordination,Decreased range of motion,Decreased endurance,Decreased strength,Hypomobility,Difficulty walking,Impaired UE functional use,Impaired vision/preception,Postural dysfunction,Impaired sensation  Visit Diagnosis: Other abnormalities of gait and mobility  Muscle weakness (generalized)  Hemiplegia and hemiparesis following cerebral infarction affecting right dominant side [facility])     Problem List Patient Active Problem List   Diagnosis Date Noted  . Dysarthria, post-stroke 11/03/2020  . Benign essential HTN 06/09/2020  . Abnormality of gait 04/28/2020  . Neurogenic bladder 03/17/2020  . Sepsis due to pneumonia ([place]) 03/06/2020  . History of stroke  with residual deficit 03/06/2020  . Transient hypotension 03/06/2020  . Spastic hemiplegia affecting nondominant side ([place])   . History of hypertension   . Expressive aphasia   . AKI (acute kidney injury) ([place])   . Global aphasia   . Hypoalbuminemia due to protein-calorie malnutrition ([place])   . Dysphagia, post-stroke   .  Hyperlipidemia 02/02/2020  . Dysphagia following cerebral infarction 02/02/2020  . Aspiration pneumonia ([place]) 02/02/2020  . Acute ischemic left MCA stroke ([place]) 02/02/2020  . Acute respiratory failure ([place])   . Acute ischemic stroke [facility]) s/p clot retrieval L MCA & ACA A3 01/27/2020  . Aspiration into airway 01/27/2020  . Chronic anticoagulation 01/27/2020  . Paroxysmal atrial fibrillation ([place]) 06/15/2018  . Essential hypertension 06/15/2018  . NICM (nonischemic cardiomyopathy) ([place]) 06/15/2018  . Visit for monitoring Tikosyn therapy 06/12/2018    Electa Sniff, PT, DPT,NCS 12/26/2020, 8:13 PM  [facility] [address] [place], [place], [address] Phone: [phone]   Fax:  [phone]  Name: DAEVEON ZWEBER MRN: [id number] Date of Birth: [birth year]

## 2020-12-29 ENCOUNTER — Other Ambulatory Visit: Payer: Self-pay

## 2020-12-29 ENCOUNTER — Ambulatory Visit: Payer: BC Managed Care – PPO

## 2020-12-29 DIAGNOSIS — I69351 Hemiplegia and hemiparesis following cerebral infarction affecting right dominant side: Secondary | ICD-10-CM

## 2020-12-29 DIAGNOSIS — M6281 Muscle weakness (generalized): Secondary | ICD-10-CM

## 2020-12-29 DIAGNOSIS — R2689 Other abnormalities of gait and mobility: Secondary | ICD-10-CM

## 2020-12-29 NOTE — Therapy (Signed)
[facility] [address] [place], [place], [address] Phone: [phone]   Fax:  [phone]  Physical Therapy Treatment  Patient Details  Name: Dustin Schneider MRN: [id number] Date of Birth: [birth year] Referring Provider (PT): Lauraine Rinne, PA-C but followed by Delice Lesch   Encounter Date: 12/29/2020   PT End of Session - 12/29/20 1321    Visit Number 52    Number of Visits 89    Authorization Type BCBS    PT Start Time 6962    PT Stop Time 1402    PT Time Calculation (min) 44 min    Equipment Utilized During Treatment Gait belt    Activity Tolerance Patient tolerated treatment well    Behavior During Therapy WFL for tasks assessed/performed           Past Medical History:  Diagnosis Date  . Hypertension   . NICM (nonischemic cardiomyopathy) ([place]) 06/15/2018   04/23/18: EF 45%, 09/15/18: NOrmal LVEF  . Paroxysmal atrial fibrillation ([place]) 06/15/2018  . Stroke ([place])   . Visit for monitoring Tikosyn therapy 06/12/2018    Past Surgical History:  Procedure Laterality Date  . BUBBLE STUDY  03/11/2020   Procedure: BUBBLE STUDY;  Surgeon: [name], MD;  Location: [facility];  Service: Cardiovascular;;  . CARDIOVERSION N/A 05/13/2018   Procedure: CARDIOVERSION;  Surgeon: [name], MD;  Location: [facility] ENDOSCOPY;  Service: Cardiovascular;  Laterality: N/A;  . CARDIOVERSION N/A 06/13/2018   Procedure: CARDIOVERSION;  Surgeon: [name], MD;  Location: [facility] ENDOSCOPY;  Service: Cardiovascular;  Laterality: N/A;  . CARDIOVERSION N/A 06/23/2018   Procedure: CARDIOVERSION;  Surgeon: [name], MD;  Location: [facility] ENDOSCOPY;  Service: Cardiovascular;  Laterality: N/A;  . CARDIOVERSION N/A 03/11/2020   Procedure: CARDIOVERSION;  Surgeon: [name], MD;  Location: [facility];  Service: Cardiovascular;  Laterality: N/A;  . excision of actinic keratosis    . EYE SURGERY     eye lift  . IR CT HEAD LTD  01/27/2020  .  IR PERCUTANEOUS ART THROMBECTOMY/INFUSION INTRACRANIAL INC DIAG ANGIO  01/27/2020      . IR PERCUTANEOUS ART THROMBECTOMY/INFUSION INTRACRANIAL INC DIAG ANGIO  01/27/2020  . RADIOLOGY WITH ANESTHESIA N/A 01/27/2020   Procedure: IR WITH ANESTHESIA;  Surgeon: Radiologist, Medication, MD;  Location: [facility];  Service: Radiology;  Laterality: N/A;  . TEE WITHOUT CARDIOVERSION N/A 03/11/2020   Procedure: TRANSESOPHAGEAL ECHOCARDIOGRAM (TEE);  Surgeon: [name], MD;  Location: [facility];  Service: Cardiovascular;  Laterality: N/A;  . TONSILLECTOMY    . WISDOM TOOTH EXTRACTION      There were no vitals filed for this visit.   Subjective Assessment - 12/29/20 1321    Subjective Pt denies any new falls. Reports he was tired after last time.    Patient is accompained by: --   gf, Dustin Schneider   Pertinent History PMH: HTN, paroxysmal a fib    Limitations Standing;Walking    Patient Stated Goals "wants everything working on R side again"    Currently in Pain? No/denies                             [facility] Adult PT Treatment/Exercise - 12/29/20 1321      Ambulation/Gait   Ambulation/Gait Yes    Ambulation/Gait Assistance 5: Supervision    Ambulation/Gait Assistance Details After gait on treadmill in BWS, pt ambulated overground focusing on trying to increase right stance time and extending through  hip with increasing left step and slowing down. PT provided tactile cues at right pelvis and left trunk. After working on step-up activity focusing on this, ambulated again overground with same focus. Pt also cued to try to keep head up.    Ambulation Distance (Feet) 230 Feet   115' x 1   Assistive device Straight cane   right AFO   Gait Pattern Step-through pattern;Decreased stance time - right;Decreased hip/knee flexion - right    Ambulation Surface Level;Indoor    Gait Comments BWS over treadmill x 10 min at 1.51mph with PT facilitating to get more right hip/knee flexion as well as verbal cues  to ride right leg back fully and push through hip while slowing down and lengthening left step. When pt did slow down and lengthen he had less tone kicking in to resist movement.      Neuro Re-ed    Neuro Re-ed Details  In // bars: side stepping 8' x 4 with verbal cues to pick up right foot and keep foot straight. Tactile cues to try to prevent left pelvic drop during right stance. Step-ups on 4" step with RLE 10 x 2 with mirror in front for visual cues and tactile cues at pelvis to facilitate right weight shift prior to stepping up and prior to stepping back with left foot. During 2nd step PT decreased LUE support to light touch versus full grip. Pt also given verbal cue to picture plum line trying to shift it more to right.                    PT Short Term Goals - 12/26/20 2009      PT SHORT TERM GOAL #1   Title Pt will decrease TUG with cane from 17.4 sec to < 16 sec for improved balance and functional mobility. ALL STGS DUE 01/22/21    Baseline 17.4 seconds    Time 4    Period Weeks    Status New    Target Date 01/22/21      PT SHORT TERM GOAL #2   Title Pt  will undergo further assessment of 6MWT with LTG to be written as appropriate.    Baseline 12/26/20 6 MWT completed    Time 4    Period Weeks    Status Achieved      PT SHORT TERM GOAL #3   Title Pt will increase gait speed from 0.53 m/s with cane to >0.67m/s for improved mobility.    Baseline .53 m/s  or 18.8 seconds    Time 4    Period Weeks    Status New      PT SHORT TERM GOAL #4   Title Pt will ambulate at least 50' over outdoor grass surfaces with cane and supervision in order to demo improved safety ambulating in his yard.    Baseline needs min guard.    Time 4    Period Weeks    Status New             PT Long Term Goals - 12/26/20 2010      PT LONG TERM GOAL #1   Title Pt will ambulate at least 200' over grass, gravel, and mulch surfaces with cane and supervision in order to demo improved  community mobility on unlevel surfaces. ALL LTGS DUE 02/19/21    Time 8    Period Weeks    Status New      PT LONG TERM GOAL #2  Title Pt will increase 6 min walk from 540' to >650' to demo improved gait endurance.    Baseline 12/26/20 540'    Time 8    Period Weeks    Status New      PT LONG TERM GOAL #3   Title Pt will perform fall recovery with supervision (and possible set up assist for RLE to come to stand) in order to demo improved safety with transfers if pt has a fall.    Time 8    Period Weeks    Status New      PT LONG TERM GOAL #4   Title Pt will ambulate up/down 4 steps with LRAD and mod I for improved access in home to living room    Baseline with cane and supervision    Time 8    Period Weeks    Status New      PT LONG TERM GOAL #5   Title Pt will improve condition 4 of mCTSIB to at least 10 seconds in order to demo improved vestibular input for balance.    Baseline 4 seconds    Time 8    Period Weeks    Status New                 Plan - 12/29/20 1416    Clinical Impression Statement Pt was able to show improved right weight shift today with increasing right stance time and hip extension while lengthening left step after BWS over treadmill and NMR activities.    Personal Factors and Comorbidities Comorbidity 3+    Comorbidities dense L MCA CVA, paroxysmal A. fib on chronic Xarelto, nonischemic cardiomyopathy and hypertension.    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, works as an Electrical engineer Evolving/Moderate complexity    Rehab Potential Good    PT Frequency 2x / week    PT Duration 8 weeks    PT Treatment/Interventions ADLs/Self Care Home Management;Aquatic Therapy;DME Instruction;Gait training;Stair training;Functional mobility  training;Therapeutic activities;Therapeutic exercise;Electrical Stimulation;Balance training;Neuromuscular re-education;Wheelchair mobility training;Orthotic Fit/Training;Patient/family education;Passive range of motion;Energy conservation    PT Next Visit Plan Do you want to add to schedule as next week would be week 3 in cert I believe. Continue with BWS for incr hip flexion during gait and right stance time. gait outdoors on grass/gravel with cane. balance strategies on compliant surfacces. activites to for incr stance time on RLE and getting more hip extension.    PT Home Exercise Plan on 06/28/20 - added standing at countetop and stepping LLE out and in    Consulted and Agree with Plan of Care Patient           Patient will benefit from skilled therapeutic intervention in order to improve the following deficits and impairments:  Abnormal gait,Decreased activity tolerance,Decreased balance,Decreased cognition,Decreased mobility,Decreased coordination,Decreased range of motion,Decreased endurance,Decreased strength,Hypomobility,Difficulty walking,Impaired UE functional use,Impaired vision/preception,Postural dysfunction,Impaired sensation  Visit Diagnosis: Other abnormalities of gait and mobility  Muscle weakness (generalized)  Hemiplegia and hemiparesis following cerebral infarction affecting right dominant side [facility])     Problem List Patient Active Problem List   Diagnosis Date Noted  . Dysarthria, post-stroke 11/03/2020  . Benign essential HTN 06/09/2020  . Abnormality of gait 04/28/2020  . Neurogenic bladder 03/17/2020  . Sepsis due to pneumonia ([place]) 03/06/2020  . History of stroke with residual deficit 03/06/2020  . Transient hypotension 03/06/2020  . Spastic hemiplegia affecting  nondominant side ([place])   . History of hypertension   . Expressive aphasia   . AKI (acute kidney injury) ([place])   . Global aphasia   . Hypoalbuminemia due to protein-calorie malnutrition ([place])   .  Dysphagia, post-stroke   . Hyperlipidemia 02/02/2020  . Dysphagia following cerebral infarction 02/02/2020  . Aspiration pneumonia ([place]) 02/02/2020  . Acute ischemic left MCA stroke ([place]) 02/02/2020  . Acute respiratory failure ([place])   . Acute ischemic stroke [facility]) s/p clot retrieval L MCA & ACA A3 01/27/2020  . Aspiration into airway 01/27/2020  . Chronic anticoagulation 01/27/2020  . Paroxysmal atrial fibrillation ([place]) 06/15/2018  . Essential hypertension 06/15/2018  . NICM (nonischemic cardiomyopathy) ([place]) 06/15/2018  . Visit for monitoring Tikosyn therapy 06/12/2018    Electa Sniff, PT, DPT, NCS 12/29/2020, 2:20 PM  [facility] [address] [place], [place], [address] Phone: [phone]   Fax:  [phone]  Name: Dustin Schneider MRN: [id number] Date of Birth: Mar 27, 1956

## 2021-01-02 ENCOUNTER — Other Ambulatory Visit: Payer: Self-pay | Admitting: Cardiology

## 2021-01-02 ENCOUNTER — Ambulatory Visit: Payer: BC Managed Care – PPO | Admitting: Physical Therapy

## 2021-01-02 ENCOUNTER — Encounter: Payer: Self-pay | Admitting: Physical Therapy

## 2021-01-02 ENCOUNTER — Other Ambulatory Visit: Payer: Self-pay

## 2021-01-02 DIAGNOSIS — R29818 Other symptoms and signs involving the nervous system: Secondary | ICD-10-CM

## 2021-01-02 DIAGNOSIS — M6281 Muscle weakness (generalized): Secondary | ICD-10-CM | POA: Diagnosis not present

## 2021-01-02 DIAGNOSIS — R2689 Other abnormalities of gait and mobility: Secondary | ICD-10-CM

## 2021-01-02 DIAGNOSIS — I69351 Hemiplegia and hemiparesis following cerebral infarction affecting right dominant side: Secondary | ICD-10-CM

## 2021-01-02 DIAGNOSIS — R2681 Unsteadiness on feet: Secondary | ICD-10-CM

## 2021-01-02 DIAGNOSIS — I480 Paroxysmal atrial fibrillation: Secondary | ICD-10-CM

## 2021-01-02 NOTE — Therapy (Addendum)
[facility] [address] [place], [place], [address] Phone: [phone]   Fax:  [phone]  Physical Therapy Treatment  Patient Details  Name: Dustin Schneider MRN: [id number] Date of Birth: 11-14-1955 Referring Provider (PT): Lauraine Rinne, PA-C but followed by Delice Lesch   Encounter Date: 01/02/2021   PT End of Session - 01/02/21 1642    Visit Number 53    Number of Visits 44    Authorization Type BCBS    PT Start Time 1402    PT Stop Time 1445    PT Time Calculation (min) 43 min    Equipment Utilized During Treatment Gait belt    Activity Tolerance Patient tolerated treatment well    Behavior During Therapy [facility] for tasks assessed/performed           Past Medical History:  Diagnosis Date  . Hypertension   . NICM (nonischemic cardiomyopathy) ([place]) 06/15/2018   04/23/18: EF 45%, 09/15/18: NOrmal LVEF  . Paroxysmal atrial fibrillation ([place]) 06/15/2018  . Stroke ([place])   . Visit for monitoring Tikosyn therapy 06/12/2018    Past Surgical History:  Procedure Laterality Date  . BUBBLE STUDY  03/11/2020   Procedure: BUBBLE STUDY;  Surgeon: [name], MD;  Location: [place];  Service: Cardiovascular;;  . CARDIOVERSION N/A 05/13/2018   Procedure: CARDIOVERSION;  Surgeon: [name], MD;  Location: [facility] ENDOSCOPY;  Service: Cardiovascular;  Laterality: N/A;  . CARDIOVERSION N/A 06/13/2018   Procedure: CARDIOVERSION;  Surgeon: [name], MD;  Location: [facility] ENDOSCOPY;  Service: Cardiovascular;  Laterality: N/A;  . CARDIOVERSION N/A 06/23/2018   Procedure: CARDIOVERSION;  Surgeon: [name], MD;  Location: [facility] ENDOSCOPY;  Service: Cardiovascular;  Laterality: N/A;  . CARDIOVERSION N/A 03/11/2020   Procedure: CARDIOVERSION;  Surgeon: [name], MD;  Location: [place];  Service: Cardiovascular;  Laterality: N/A;  . excision of actinic keratosis    . EYE SURGERY     eye lift  . IR CT HEAD LTD  01/27/2020  .  IR PERCUTANEOUS ART THROMBECTOMY/INFUSION INTRACRANIAL INC DIAG ANGIO  01/27/2020      . IR PERCUTANEOUS ART THROMBECTOMY/INFUSION INTRACRANIAL INC DIAG ANGIO  01/27/2020  . RADIOLOGY WITH ANESTHESIA N/A 01/27/2020   Procedure: IR WITH ANESTHESIA;  Surgeon: Radiologist, Medication, MD;  Location: [place];  Service: Radiology;  Laterality: N/A;  . TEE WITHOUT CARDIOVERSION N/A 03/11/2020   Procedure: TRANSESOPHAGEAL ECHOCARDIOGRAM (TEE);  Surgeon: [name], MD;  Location: [facility];  Service: Cardiovascular;  Laterality: N/A;  . TONSILLECTOMY    . WISDOM TOOTH EXTRACTION      There were no vitals filed for this visit.   Subjective Assessment - 01/02/21 1411    Subjective Had a fall last Friday - was getting out of the car and closing the door and was standing on an incline and lost his balance. Was able to get up by himself using the car.    Patient is accompained by: --   gf, [name]   Pertinent History PMH: HTN, paroxysmal a fib    Limitations Standing;Walking    Patient Stated Goals "wants everything working on R side again"    Currently in Pain? No/denies                           01/02/21 1432  Ambulation/Gait  Ambulation/Gait Yes  Ambulation/Gait Assistance 5: Supervision  Ambulation/Gait Assistance Details after NMR activities - cues for incr stance time for RLE for  incr step length with LLE, focusing on slowing down, with pt demonstrating improvement with RLE foot clearance. manual cues provided from therapist at pt's pelvis for incr stance time to RLE. cues throughout for tall posture and looking ahead  Ambulation Distance (Feet) 345 Feet  Assistive device Straight cane (with 6 prong tip)  Gait Pattern Step-through pattern;Decreased stance time - right;Decreased hip/knee flexion - right  Ambulation Surface Level;Indoor  Therapeutic Activites   Therapeutic Activities Other Therapeutic Activities  Other Therapeutic Activities due to recent fall discussed with pt  and pt's gf to make sure that pt always has someone that can open/close the car door for him, for him to be dropped off in his driveway and to have someone walk into pt's house with him for safety. pt inquiring about going into a pool and has a friend that uses the pool at [facility]. discussed with pt and pt's gf that aquatic therapy for PT could be an option, discussed logistics and use of aquatics for gait, balance, strength, tone reduction etc, with pt interested. Discussed will put in referral and make sure it is covered by insurance.   Neuro Re-ed   Neuro Re-ed Details  NMR: with use of mirror as visual cue for tall posture and therapist on pt's R to assist with weight shift - weight shifting towards R and stepping forward with LLE and then back to midline x15 reps - with cues for slowed and controlled, then performed stepping LLE out forward/laterally x10 reps, at counter top with wider BOS and RLE staggered in front of LLE, reaching forwards at counter with RUE on washcloth (therapist assisting with hand over hand technique) - verbal/tactile cues for tall posture and bring R hip into counter performed x15 reps with assist as needed for weight shift and min guard at times for balance. With LUE support, therapist providing manual cues at pt's R thigh for incr weight shift and hip extension when stepping with LLE x15 reps.             PT Education - 01/02/21 1642    Education Details see TA    Person(s) Educated Patient    Methods Explanation    Comprehension Verbalized understanding            PT Short Term Goals - 12/26/20 2009      PT SHORT TERM GOAL #1   Title Pt will decrease TUG with cane from 17.4 sec to < 16 sec for improved balance and functional mobility. ALL STGS DUE 01/22/21    Baseline 17.4 seconds    Time 4    Period Weeks    Status New    Target Date 01/22/21      PT SHORT TERM GOAL #2   Title Pt  will undergo further assessment of 6MWT with LTG to be written as  appropriate.    Baseline 12/26/20 6 MWT completed    Time 4    Period Weeks    Status Achieved      PT SHORT TERM GOAL #3   Title Pt will increase gait speed from 0.53 m/s with cane to >0.9m/s for improved mobility.    Baseline .53 m/s  or 18.8 seconds    Time 4    Period Weeks    Status New      PT SHORT TERM GOAL #4   Title Pt will ambulate at least 50' over outdoor grass surfaces with cane and supervision in order to demo improved safety ambulating in  his yard.    Baseline needs min guard.    Time 4    Period Weeks    Status New             PT Long Term Goals - 12/26/20 2010      PT LONG TERM GOAL #1   Title Pt will ambulate at least 200' over grass, gravel, and mulch surfaces with cane and supervision in order to demo improved community mobility on unlevel surfaces. ALL LTGS DUE 02/19/21    Time 8    Period Weeks    Status New      PT LONG TERM GOAL #2   Title Pt will increase 6 min walk from 540' to >650' to demo improved gait endurance.    Baseline 12/26/20 540'    Time 8    Period Weeks    Status New      PT LONG TERM GOAL #3   Title Pt will perform fall recovery with supervision (and possible set up assist for RLE to come to stand) in order to demo improved safety with transfers if pt has a fall.    Time 8    Period Weeks    Status New      PT LONG TERM GOAL #4   Title Pt will ambulate up/down 4 steps with LRAD and mod I for improved access in home to living room    Baseline with cane and supervision    Time 8    Period Weeks    Status New      PT LONG TERM GOAL #5   Title Pt will improve condition 4 of mCTSIB to at least 10 seconds in order to demo improved vestibular input for balance.    Baseline 4 seconds    Time 8    Period Weeks    Status New             01/04/21 9562  Plan  Clinical Impression Statement Continued to focus on R weight shifting activities prior to gait training today with cane. When focusing on more slowed pace with gait pt  able to improve R stance time and hip extension, with also better RLE foot clearance during gait. Will continue to progress towards LTGs.  Personal Factors and Comorbidities Comorbidity 3+  Comorbidities dense L MCA CVA, paroxysmal A. fib on chronic Xarelto, nonischemic cardiomyopathy and hypertension.  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, works as an Gaffer)  Pt will benefit from skilled therapeutic intervention in order to improve on the following deficits Abnormal gait;Decreased activity tolerance;Decreased balance;Decreased cognition;Decreased mobility;Decreased coordination;Decreased range of motion;Decreased endurance;Decreased strength;Hypomobility;Difficulty walking;Impaired UE functional use;Impaired vision/preception;Postural dysfunction;Impaired sensation  Stability/Clinical Decision Making Evolving/Moderate complexity  Rehab Potential Good  PT Frequency 2x / week  PT Duration 8 weeks  PT Treatment/Interventions ADLs/Self Care Home Management;Aquatic Therapy;DME Instruction;Gait training;Stair training;Functional mobility training;Therapeutic activities;Therapeutic exercise;Electrical Stimulation;Balance training;Neuromuscular re-education;Wheelchair mobility training;Orthotic Fit/Training;Patient/family education;Passive range of motion;Energy conservation  PT Next Visit Plan any updates on aquatics? Continue with BWS for incr hip flexion during gait and right stance time. gait outdoors on grass/gravel with cane. balance strategies on compliant surfacces. activites to for incr stance time on RLE and getting more hip extension.  PT Home Exercise Plan on 06/28/20 - added standing at countetop and stepping LLE out and in  Consulted and Agree with Plan of Care Patient          Patient will  benefit from  skilled therapeutic intervention in order to improve the following deficits and impairments:     Visit Diagnosis: Other abnormalities of gait and mobility  Muscle weakness (generalized)  Hemiplegia and hemiparesis following cerebral infarction affecting right dominant side (HCC)  Unsteadiness on feet  Other symptoms and signs involving the nervous system     Problem List Patient Active Problem List   Diagnosis Date Noted  . Dysarthria, post-stroke 11/03/2020  . Benign essential HTN 06/09/2020  . Abnormality of gait 04/28/2020  . Neurogenic bladder 03/17/2020  . Sepsis due to pneumonia ([place]) 03/06/2020  . History of stroke with residual deficit 03/06/2020  . Transient hypotension 03/06/2020  . Spastic hemiplegia affecting nondominant side ([place])   . History of hypertension   . Expressive aphasia   . AKI (acute kidney injury) ([place])   . Global aphasia   . Hypoalbuminemia due to protein-calorie malnutrition ([place])   . Dysphagia, post-stroke   . Hyperlipidemia 02/02/2020  . Dysphagia following cerebral infarction 02/02/2020  . Aspiration pneumonia ([place]) 02/02/2020  . Acute ischemic left MCA stroke ([place]) 02/02/2020  . Acute respiratory failure ([place])   . Acute ischemic stroke [facility]) s/p clot retrieval L MCA & ACA A3 01/27/2020  . Aspiration into airway 01/27/2020  . Chronic anticoagulation 01/27/2020  . Paroxysmal atrial fibrillation ([place]) 06/15/2018  . Essential hypertension 06/15/2018  . NICM (nonischemic cardiomyopathy) ([place]) 06/15/2018  . Visit for monitoring Tikosyn therapy 06/12/2018    Arliss Journey, PT, DPT  01/02/2021, 4:43 PM  [facility] [address] [place], [place], [address] Phone: [phone]   Fax:  [phone]  Name: JAVANNI CALDERON MRN: [id number] Date of Birth: 09-17-1955

## 2021-01-06 ENCOUNTER — Other Ambulatory Visit: Payer: Self-pay

## 2021-01-06 ENCOUNTER — Ambulatory Visit: Payer: BC Managed Care – PPO | Admitting: Physical Therapy

## 2021-01-06 ENCOUNTER — Encounter: Payer: Self-pay | Admitting: Physical Therapy

## 2021-01-06 DIAGNOSIS — M6281 Muscle weakness (generalized): Secondary | ICD-10-CM

## 2021-01-06 DIAGNOSIS — R2689 Other abnormalities of gait and mobility: Secondary | ICD-10-CM

## 2021-01-06 DIAGNOSIS — I69351 Hemiplegia and hemiparesis following cerebral infarction affecting right dominant side: Secondary | ICD-10-CM

## 2021-01-06 DIAGNOSIS — R2681 Unsteadiness on feet: Secondary | ICD-10-CM

## 2021-01-06 NOTE — Therapy (Signed)
[facility] [address] [place], [place], [address] Phone: [phone]   Fax:  [phone]  Physical Therapy Treatment  Patient Details  Name: Dustin Schneider MRN: [id number] Date of Birth: [birth year] Referring Provider (PT): Lauraine Rinne, PA-C but followed by Delice Lesch   Encounter Date: 01/06/2021   PT End of Session - 01/06/21 1414    Visit Number 22    Number of Visits 61    Authorization Type BCBS    PT Start Time 1320    PT Stop Time 1402    PT Time Calculation (min) 42 min    Equipment Utilized During Treatment Gait belt    Activity Tolerance Patient tolerated treatment well    Behavior During Therapy WFL for tasks assessed/performed           Past Medical History:  Diagnosis Date  . Hypertension   . NICM (nonischemic cardiomyopathy) ([place]) 06/15/2018   04/23/18: EF 45%, 09/15/18: NOrmal LVEF  . Paroxysmal atrial fibrillation ([place]) 06/15/2018  . Stroke ([place])   . Visit for monitoring Tikosyn therapy 06/12/2018    Past Surgical History:  Procedure Laterality Date  . BUBBLE STUDY  03/11/2020   Procedure: BUBBLE STUDY;  Surgeon: [name], MD;  Location: [place];  Service: Cardiovascular;;  . CARDIOVERSION N/A 05/13/2018   Procedure: CARDIOVERSION;  Surgeon: [name], MD;  Location: [facility] ENDOSCOPY;  Service: Cardiovascular;  Laterality: N/A;  . CARDIOVERSION N/A 06/13/2018   Procedure: CARDIOVERSION;  Surgeon: [name], MD;  Location: [facility] ENDOSCOPY;  Service: Cardiovascular;  Laterality: N/A;  . CARDIOVERSION N/A 06/23/2018   Procedure: CARDIOVERSION;  Surgeon: [name], MD;  Location: [facility] ENDOSCOPY;  Service: Cardiovascular;  Laterality: N/A;  . CARDIOVERSION N/A 03/11/2020   Procedure: CARDIOVERSION;  Surgeon: [name], MD;  Location: [place];  Service: Cardiovascular;  Laterality: N/A;  . excision of actinic keratosis    . EYE SURGERY     eye lift  . IR CT HEAD LTD  01/27/2020  .  IR PERCUTANEOUS ART THROMBECTOMY/INFUSION INTRACRANIAL INC DIAG ANGIO  01/27/2020      . IR PERCUTANEOUS ART THROMBECTOMY/INFUSION INTRACRANIAL INC DIAG ANGIO  01/27/2020  . RADIOLOGY WITH ANESTHESIA N/A 01/27/2020   Procedure: IR WITH ANESTHESIA;  Surgeon: Radiologist, Medication, MD;  Location: [facility];  Service: Radiology;  Laterality: N/A;  . TEE WITHOUT CARDIOVERSION N/A 03/11/2020   Procedure: TRANSESOPHAGEAL ECHOCARDIOGRAM (TEE);  Surgeon: [name], MD;  Location: [place];  Service: Cardiovascular;  Laterality: N/A;  . TONSILLECTOMY    . WISDOM TOOTH EXTRACTION      There were no vitals filed for this visit.   Subjective Assessment - 01/06/21 1323    Subjective No changes and no falls.    Patient is accompained by: --   gf, [name]   Pertinent History PMH: HTN, paroxysmal a fib    Limitations Standing;Walking    Patient Stated Goals "wants everything working on R side again"    Currently in Pain? No/denies                             [facility] Adult PT Treatment/Exercise - 01/06/21 0001      Ambulation/Gait   Ambulation/Gait Yes    Ambulation/Gait Assistance 5: Supervision    Ambulation/Gait Assistance Details after gait with BWS on treadmill, continued overground gait training with cues for tall posture, and incr stance time on RLE with therapist providing tactile cues at  R pelvis for incr step length with LLE - when pt takes longer steps with LLE, demonstrates improved RLE foot clearance    Ambulation Distance (Feet) 230 Feet   x1, 115 x 1   Assistive device Straight cane   with 6 prong tip   Gait Pattern Step-through pattern;Decreased stance time - right;Decreased hip/knee flexion - right    Ambulation Surface Level;Indoor    Gait Comments BWS over treadmill x 11 min at 1.42mph with PT facilitating for incr R hip/knee flexion as well as verbal cues for R hip extension and longer stance time on RLE for incr step length with LLE with pt better able to clear RLE.  part of bout on treadmill had pt participate in dual tasking by conversing with therapist while maintaining incr step length with LLE (cues at times for this)      Neuro Re-ed    Neuro Re-ed Details  NMR: in half kneling position on red mat on floor (pt able to get down onto floor with supervision from scooting down from mat table), with RLE forwards working on weight shifting forwards/backwards x10 reps, pt holding R arm/hand with LUE reaching over RLE outside of BOS to tap therapist's hand as a target x12 reps - cues to look over at target with eyes/head before trrying to touch it, pt needing min A to help bring LLE forwards (verbal and tactile cues to shift weight over to R side first before bringing LLE through) and therapist helping to position RLE posteriorly, in this position working on  weight shifting forwards/backwards x10 reps with use of mirror for visual cue for posture throughout                    PT Short Term Goals - 12/26/20 2009      PT SHORT TERM GOAL #1   Title Pt will decrease TUG with cane from 17.4 sec to < 16 sec for improved balance and functional mobility. ALL STGS DUE 01/22/21    Baseline 17.4 seconds    Time 4    Period Weeks    Status New    Target Date 01/22/21      PT SHORT TERM GOAL #2   Title Pt  will undergo further assessment of 6MWT with LTG to be written as appropriate.    Baseline 12/26/20 6 MWT completed    Time 4    Period Weeks    Status Achieved      PT SHORT TERM GOAL #3   Title Pt will increase gait speed from 0.53 m/s with cane to >0.110m/s for improved mobility.    Baseline .53 m/s  or 18.8 seconds    Time 4    Period Weeks    Status New      PT SHORT TERM GOAL #4   Title Pt will ambulate at least 50' over outdoor grass surfaces with cane and supervision in order to demo improved safety ambulating in his yard.    Baseline needs min guard.    Time 4    Period Weeks    Status New             PT Long Term Goals - 12/26/20  2010      PT LONG TERM GOAL #1   Title Pt will ambulate at least 200' over grass, gravel, and mulch surfaces with cane and supervision in order to demo improved community mobility on unlevel surfaces. ALL LTGS DUE 02/19/21    Time  8    Period Weeks    Status New      PT LONG TERM GOAL #2   Title Pt will increase 6 min walk from 540' to >650' to demo improved gait endurance.    Baseline 12/26/20 540'    Time 8    Period Weeks    Status New      PT LONG TERM GOAL #3   Title Pt will perform fall recovery with supervision (and possible set up assist for RLE to come to stand) in order to demo improved safety with transfers if pt has a fall.    Time 8    Period Weeks    Status New      PT LONG TERM GOAL #4   Title Pt will ambulate up/down 4 steps with LRAD and mod I for improved access in home to living room    Baseline with cane and supervision    Time 8    Period Weeks    Status New      PT LONG TERM GOAL #5   Title Pt will improve condition 4 of mCTSIB to at least 10 seconds in order to demo improved vestibular input for balance.    Baseline 4 seconds    Time 8    Period Weeks    Status New                 Plan - 01/06/21 1415    Clinical Impression Statement After BWS treadmill training with focus on incr LLE step length with incr stance time on RLE, pt able to demonstrate during over ground gait training afterwards with tactile cues at pt's R pelvis to incr stance time. Pt with improved RLE foot clearance when slowing down gait and has incr R hip extension. Pt tolerated half kneeling activities well today, did need some assist to weight shift to RLE and get LLE all the way through. Will continue to progress towards LTGs.    Personal Factors and Comorbidities Comorbidity 3+    Comorbidities dense L MCA CVA, paroxysmal A. fib on chronic Xarelto, nonischemic cardiomyopathy and hypertension.    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, works as an Electrical engineer Evolving/Moderate complexity    Rehab Potential Good    PT Frequency 2x / week    PT Duration 8 weeks    PT Treatment/Interventions ADLs/Self Care Home Management;Aquatic Therapy;DME Instruction;Gait training;Stair training;Functional mobility training;Therapeutic activities;Therapeutic exercise;Electrical Stimulation;Balance training;Neuromuscular re-education;Wheelchair mobility training;Orthotic Fit/Training;Patient/family education;Passive range of motion;Energy conservation    PT Next Visit Plan any updates on aquatics? (i put it referral). tall kneeling/half kneeling activities to pt's tolerance. Continue with BWS for incr hip flexion during gait, R stance time and slowing down. gait outdoors on grass/gravel with cane. balance strategies on compliant surfacces. activites to for incr stance time on RLE and getting more hip extension.    PT Home Exercise Plan on 06/28/20 - added standing at countetop and stepping LLE out and in    Consulted and Agree with Plan of Care Patient           Patient will benefit from skilled therapeutic intervention in order to improve the following deficits and impairments:  Abnormal gait,Decreased activity tolerance,Decreased balance,Decreased cognition,Decreased mobility,Decreased coordination,Decreased range of motion,Decreased endurance,Decreased strength,Hypomobility,Difficulty walking,Impaired UE functional use,Impaired vision/preception,Postural dysfunction,Impaired sensation  Visit Diagnosis: Other abnormalities of gait and mobility  Muscle weakness (generalized)  Hemiplegia and hemiparesis following cerebral infarction affecting right dominant side (HCC)  Unsteadiness on feet     Problem List Patient  Active Problem List   Diagnosis Date Noted  . Dysarthria, post-stroke 11/03/2020  . Benign essential HTN 06/09/2020  . Abnormality of gait 04/28/2020  . Neurogenic bladder 03/17/2020  . Sepsis due to pneumonia ([place]) 03/06/2020  . History of stroke with residual deficit 03/06/2020  . Transient hypotension 03/06/2020  . Spastic hemiplegia affecting nondominant side ([place])   . History of hypertension   . Expressive aphasia   . AKI (acute kidney injury) ([place])   . Global aphasia   . Hypoalbuminemia due to protein-calorie malnutrition ([place])   . Dysphagia, post-stroke   . Hyperlipidemia 02/02/2020  . Dysphagia following cerebral infarction 02/02/2020  . Aspiration pneumonia ([place]) 02/02/2020  . Acute ischemic left MCA stroke ([place]) 02/02/2020  . Acute respiratory failure ([place])   . Acute ischemic stroke [facility]) s/p clot retrieval L MCA & ACA A3 01/27/2020  . Aspiration into airway 01/27/2020  . Chronic anticoagulation 01/27/2020  . Paroxysmal atrial fibrillation ([place]) 06/15/2018  . Essential hypertension 06/15/2018  . NICM (nonischemic cardiomyopathy) ([place]) 06/15/2018  . Visit for monitoring Tikosyn therapy 06/12/2018    Arliss Journey, PT, DPT  01/06/2021, 2:17 PM  [facility] [address] [place], [place], [address] Phone: [phone]   Fax:  [phone]  Name: AASHIR UMHOLTZ MRN: [id number] Date of Birth: Mar 10, 1956

## 2021-01-10 ENCOUNTER — Ambulatory Visit: Payer: BC Managed Care – PPO | Admitting: Physical Therapy

## 2021-01-10 ENCOUNTER — Ambulatory Visit: Payer: BC Managed Care – PPO

## 2021-01-12 ENCOUNTER — Ambulatory Visit: Payer: BC Managed Care – PPO | Admitting: Physical Therapy

## 2021-01-13 ENCOUNTER — Ambulatory Visit: Payer: BC Managed Care – PPO | Attending: Physician Assistant | Admitting: Physical Therapy

## 2021-01-13 ENCOUNTER — Other Ambulatory Visit: Payer: Self-pay

## 2021-01-13 DIAGNOSIS — R2681 Unsteadiness on feet: Secondary | ICD-10-CM

## 2021-01-13 DIAGNOSIS — M6281 Muscle weakness (generalized): Secondary | ICD-10-CM | POA: Insufficient documentation

## 2021-01-13 DIAGNOSIS — R2689 Other abnormalities of gait and mobility: Secondary | ICD-10-CM

## 2021-01-13 DIAGNOSIS — I69351 Hemiplegia and hemiparesis following cerebral infarction affecting right dominant side: Secondary | ICD-10-CM | POA: Insufficient documentation

## 2021-01-13 NOTE — Patient Instructions (Signed)
   Aquatic Therapy: What to Expect!  Where:  [facility] [address] [place], [place] [address] [phone]           How to Prepare: . Please make sure you drink 8 ounces of water about one hour prior to your pool session . A caregiver must attend the entire session with the patient (unless your primary therapists feels this is not necessary). The caregiver will be responsible for assisting with dressing as well as any toileting needs.  . Please arrive IN YOUR SUIT and a few minutes prior to your appointment - this helps to avoid delays in starting your session. . Please make sure to attend to any toileting needs prior to entering the pool . Once on the pool deck your therapist will ask you to sign the Patient  Consent and Assignment of Benefits form . Your therapist may take your blood pressure prior to, during and after your session if indicated . We usually try and create a home exercise program based on activities we do in the pool.  Please be thinking about who might be able to assist you in the pool should you want to participate in an aquatic home exercise program at the time of discharge.  Some patients do not want to or do not have the ability to participate in an aquatic home program - this is not a barrier in any way to you participating in aquatic therapy as part of your current therapy plan!    About the pool: 1. Entering the pool Your therapist will assist you; there are multiple ways to enter including stairs with railings, a walk in ramp, a roll in chair and a mechanical lift. Your therapist will determine the most appropriate way for you. 2. Water temperature is usually between 86-87 degrees 3. There may be other swimmers in the pool at the same time     Contact Info:             Appointments: [facility]         All sessions are 45 minutes   [address]            Please call the [facility] if   [place], [place]  [address]           you need to cancel or reschedule an appointment.  [place]           Guido Sander, PT    Forde Radon, OTR/L    02/03/20

## 2021-01-13 NOTE — Therapy (Signed)
[facility] [address] [place], [place], [address] Phone: [phone]   Fax:  [phone]  Physical Therapy Treatment  Patient Details  Name: Dustin Schneider MRN: [id number] Date of Birth: [birth year] Referring Provider (PT): Lauraine Rinne, PA-C but followed by Delice Lesch   Encounter Date: 01/13/2021   PT End of Session - 01/13/21 4315    Visit Number 86    Number of Visits 11    Authorization Type BCBS    PT Start Time 4008    PT Stop Time 1400    PT Time Calculation (min) 43 min    Equipment Utilized During Treatment Gait belt    Activity Tolerance Patient tolerated treatment well    Behavior During Therapy WFL for tasks assessed/performed           Past Medical History:  Diagnosis Date  . Hypertension   . NICM (nonischemic cardiomyopathy) ([place]) 06/15/2018   04/23/18: EF 45%, 09/15/18: NOrmal LVEF  . Paroxysmal atrial fibrillation ([place]) 06/15/2018  . Stroke ([place])   . Visit for monitoring Tikosyn therapy 06/12/2018    Past Surgical History:  Procedure Laterality Date  . BUBBLE STUDY  03/11/2020   Procedure: BUBBLE STUDY;  Surgeon: [name], MD;  Location: [place];  Service: Cardiovascular;;  . CARDIOVERSION N/A 05/13/2018   Procedure: CARDIOVERSION;  Surgeon: [name], MD;  Location: [facility] ENDOSCOPY;  Service: Cardiovascular;  Laterality: N/A;  . CARDIOVERSION N/A 06/13/2018   Procedure: CARDIOVERSION;  Surgeon: [name], MD;  Location: [facility] ENDOSCOPY;  Service: Cardiovascular;  Laterality: N/A;  . CARDIOVERSION N/A 06/23/2018   Procedure: CARDIOVERSION;  Surgeon: [name], MD;  Location: [facility] ENDOSCOPY;  Service: Cardiovascular;  Laterality: N/A;  . CARDIOVERSION N/A 03/11/2020   Procedure: CARDIOVERSION;  Surgeon: [name], MD;  Location: [place];  Service: Cardiovascular;  Laterality: N/A;  . excision of actinic keratosis    . EYE SURGERY     eye lift  . IR CT HEAD LTD  01/27/2020  . IR  PERCUTANEOUS ART THROMBECTOMY/INFUSION INTRACRANIAL INC DIAG ANGIO  01/27/2020      . IR PERCUTANEOUS ART THROMBECTOMY/INFUSION INTRACRANIAL INC DIAG ANGIO  01/27/2020  . RADIOLOGY WITH ANESTHESIA N/A 01/27/2020   Procedure: IR WITH ANESTHESIA;  Surgeon: Radiologist, Medication, MD;  Location: [place];  Service: Radiology;  Laterality: N/A;  . TEE WITHOUT CARDIOVERSION N/A 03/11/2020   Procedure: TRANSESOPHAGEAL ECHOCARDIOGRAM (TEE);  Surgeon: [name], MD;  Location: [place];  Service: Cardiovascular;  Laterality: N/A;  . TONSILLECTOMY    . WISDOM TOOTH EXTRACTION      There were no vitals filed for this visit.   Subjective Assessment - 01/13/21 1326    Subjective Nothing new since he was last here.    Patient is accompained by: --   gf, Dustin Schneider   Pertinent History PMH: HTN, paroxysmal a fib    Limitations Standing;Walking    Patient Stated Goals "wants everything working on R side again"    Currently in Pain? No/denies                             [facility] Adult PT Treatment/Exercise - 01/13/21 1344      Ambulation/Gait   Ambulation/Gait Yes    Ambulation/Gait Assistance 5: Supervision    Ambulation/Gait Assistance Details cues for tall posture, and incr stance time on RLE with therapist providing tactile cues at R pelvis for weight shift and for incr step  length with LLE - when pt takes longer steps with LLE, demonstrates improved RLE foot clearance    Ambulation Distance (Feet) 230 Feet   plus additional clinic distances   Assistive device Straight cane   with 6 prong tip   Gait Pattern Step-through pattern;Decreased stance time - right;Decreased hip/knee flexion - right    Ambulation Surface Level;Indoor      Therapeutic Activites    Therapeutic Activities Other Therapeutic Activities    Other Therapeutic Activities scheduled aquatic appt and got pt scheduled - see pt instructions for more information      Neuro Re-ed    Neuro Re-ed Details  NMR: in mini squat  position grabbing bean bag from chair on L with RUE (therapist performing hand over hand technique to help grab onto bean bag and then weight shifting over to R to place on chair x15 reps, then x15 reps with RLE forwards and reaching forwards for bean bag on chair and then bringing it to chair on pt's R - min guard/min A for balance and assist for weight shift.  on red mat on floor in half kneeling position with RLE forwards - performing multidirectional reaching forwards/laterally with RUE (therapist helping guide RUE) for incr weight shift over RLE, then with RLE posteriorly (pt needing min A to help get into this position and for RLE alignment in position) - working on tall posture and trying to maintain balance 3 x 15 seconds with intermittent UE support, cues for posture and glute actiation, in // bars: shifting weight to RLE and then stepping LLE to yellow floor dot, performed x10 reps with UE/fingertip support and then x10 reps with no UE support, with therapist providing assist at pelvis for weight shift, use of mirror as visual cue                  PT Education - 01/13/21 1516    Education Details pt scheduled for aquatics on monday, provided handout of information of instructions/what to expect and location    Person(s) Educated Patient;Caregiver(s)   pt's gf Dustin Schneider notified via phone after session   Methods Explanation;Handout    Comprehension Verbalized understanding            PT Short Term Goals - 12/26/20 2009      PT SHORT TERM GOAL #1   Title Pt will decrease TUG with cane from 17.4 sec to < 16 sec for improved balance and functional mobility. ALL STGS DUE 01/22/21    Baseline 17.4 seconds    Time 4    Period Weeks    Status New    Target Date 01/22/21      PT SHORT TERM GOAL #2   Title Pt  will undergo further assessment of 6MWT with LTG to be written as appropriate.    Baseline 12/26/20 6 MWT completed    Time 4    Period Weeks    Status Achieved      PT SHORT  TERM GOAL #3   Title Pt will increase gait speed from 0.53 m/s with cane to >0.10m/s for improved mobility.    Baseline .53 m/s  or 18.8 seconds    Time 4    Period Weeks    Status New      PT SHORT TERM GOAL #4   Title Pt will ambulate at least 50' over outdoor grass surfaces with cane and supervision in order to demo improved safety ambulating in his yard.    Baseline needs  min guard.    Time 4    Period Weeks    Status New             PT Long Term Goals - 12/26/20 2010      PT LONG TERM GOAL #1   Title Pt will ambulate at least 200' over grass, gravel, and mulch surfaces with cane and supervision in order to demo improved community mobility on unlevel surfaces. ALL LTGS DUE 02/19/21    Time 8    Period Weeks    Status New      PT LONG TERM GOAL #2   Title Pt will increase 6 min walk from 540' to >650' to demo improved gait endurance.    Baseline 12/26/20 540'    Time 8    Period Weeks    Status New      PT LONG TERM GOAL #3   Title Pt will perform fall recovery with supervision (and possible set up assist for RLE to come to stand) in order to demo improved safety with transfers if pt has a fall.    Time 8    Period Weeks    Status New      PT LONG TERM GOAL #4   Title Pt will ambulate up/down 4 steps with LRAD and mod I for improved access in home to living room    Baseline with cane and supervision    Time 8    Period Weeks    Status New      PT LONG TERM GOAL #5   Title Pt will improve condition 4 of mCTSIB to at least 10 seconds in order to demo improved vestibular input for balance.    Baseline 4 seconds    Time 8    Period Weeks    Status New                 Plan - 01/13/21 1519    Clinical Impression Statement Today's skilled session focused on NMR activities for transitional movements and for incr weight shifting to RLE. Performed half kneeling again to pt's tolerance with pt with incr difficulty when RLE posteriorly. Continued to work on gait  training by incr stance time on RLE for incr step length with LLE. Pt tolerated session well. Will continue to progress towards LTGs.    Personal Factors and Comorbidities Comorbidity 3+    Comorbidities dense L MCA CVA, paroxysmal A. fib on chronic Xarelto, nonischemic cardiomyopathy and hypertension.    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 goals due at end of next week.  tall kneeling/half kneeling activities to pt's tolerance. Continue with BWS for incr hip flexion during gait, R stance time and slowing down. gait outdoors on grass/gravel with cane. balance strategies on compliant surfacces. activites to for incr stance time on RLE and getting more hip extension.    PT Home Exercise Plan on 06/28/20 - added standing at countetop and stepping LLE out and in    Consulted and Agree with Plan  of Care Patient           Patient will benefit from skilled therapeutic intervention in order to improve the following deficits and impairments:  Abnormal gait,Decreased activity tolerance,Decreased balance,Decreased cognition,Decreased mobility,Decreased coordination,Decreased range of motion,Decreased endurance,Decreased  strength,Hypomobility,Difficulty walking,Impaired UE functional use,Impaired vision/preception,Postural dysfunction,Impaired sensation  Visit Diagnosis: Other abnormalities of gait and mobility  Muscle weakness (generalized)  Unsteadiness on feet  Hemiplegia and hemiparesis following cerebral infarction affecting right dominant side [facility])     Problem List Patient Active Problem List   Diagnosis Date Noted  . Dysarthria, post-stroke 11/03/2020  . Benign essential HTN 06/09/2020  . Abnormality of gait 04/28/2020  . Neurogenic bladder 03/17/2020  . Sepsis due to pneumonia ([place]) 03/06/2020  . History of stroke with residual deficit 03/06/2020  . Transient hypotension 03/06/2020  . Spastic hemiplegia affecting nondominant side ([place])   . History of hypertension   . Expressive aphasia   . AKI (acute kidney injury) ([place])   . Global aphasia   . Hypoalbuminemia due to protein-calorie malnutrition ([place])   . Dysphagia, post-stroke   . Hyperlipidemia 02/02/2020  . Dysphagia following cerebral infarction 02/02/2020  . Aspiration pneumonia ([place]) 02/02/2020  . Acute ischemic left MCA stroke ([place]) 02/02/2020  . Acute respiratory failure ([place])   . Acute ischemic stroke [facility]) s/p clot retrieval L MCA & ACA A3 01/27/2020  . Aspiration into airway 01/27/2020  . Chronic anticoagulation 01/27/2020  . Paroxysmal atrial fibrillation ([place]) 06/15/2018  . Essential hypertension 06/15/2018  . NICM (nonischemic cardiomyopathy) ([place]) 06/15/2018  . Visit for monitoring Tikosyn therapy 06/12/2018    Arliss Journey, PT, DPT  01/13/2021, 3:22 PM  [facility] [address] [place], [place], [address] Phone: [phone]   Fax:  [phone]  Name: PLUMMER MATICH MRN: [id number] Date of Birth: Nov 03, 1955

## 2021-01-16 ENCOUNTER — Ambulatory Visit: Payer: BC Managed Care – PPO | Admitting: Physical Therapy

## 2021-01-16 ENCOUNTER — Other Ambulatory Visit: Payer: Self-pay

## 2021-01-16 ENCOUNTER — Encounter: Payer: Self-pay | Admitting: Physical Therapy

## 2021-01-16 DIAGNOSIS — M6281 Muscle weakness (generalized): Secondary | ICD-10-CM

## 2021-01-16 DIAGNOSIS — R2689 Other abnormalities of gait and mobility: Secondary | ICD-10-CM

## 2021-01-16 DIAGNOSIS — R2681 Unsteadiness on feet: Secondary | ICD-10-CM

## 2021-01-16 NOTE — Therapy (Signed)
[facility] [address] [place], [place], [address] Phone: [phone]   Fax:  [phone]  Physical Therapy Treatment  Patient Details  Name: Dustin Schneider MRN: [id number] Date of Birth: [birth year] Referring Provider (PT): Lauraine Rinne, PA-C but followed by Delice Lesch   Encounter Date: 01/16/2021   PT End of Session - 01/16/21 1649    Visit Number 61    Number of Visits 10    Authorization Type BCBS    PT Start Time 4401    PT Stop Time 1230    PT Time Calculation (min) 45 min    Equipment Utilized During Treatment Other (comment)   gait belt, floatation belt, pool noodle, neck noodle   Activity Tolerance Patient tolerated treatment well    Behavior During Therapy [facility] for tasks assessed/performed           Past Medical History:  Diagnosis Date  . Hypertension   . NICM (nonischemic cardiomyopathy) ([place]) 06/15/2018   04/23/18: EF 45%, 09/15/18: NOrmal LVEF  . Paroxysmal atrial fibrillation ([place]) 06/15/2018  . Stroke ([place])   . Visit for monitoring Tikosyn therapy 06/12/2018    Past Surgical History:  Procedure Laterality Date  . BUBBLE STUDY  03/11/2020   Procedure: BUBBLE STUDY;  Surgeon: [name], MD;  Location: [facility];  Service: Cardiovascular;;  . CARDIOVERSION N/A 05/13/2018   Procedure: CARDIOVERSION;  Surgeon: [name], MD;  Location: [facility] ENDOSCOPY;  Service: Cardiovascular;  Laterality: N/A;  . CARDIOVERSION N/A 06/13/2018   Procedure: CARDIOVERSION;  Surgeon: [name], MD;  Location: [facility] ENDOSCOPY;  Service: Cardiovascular;  Laterality: N/A;  . CARDIOVERSION N/A 06/23/2018   Procedure: CARDIOVERSION;  Surgeon: [name], MD;  Location: [facility] ENDOSCOPY;  Service: Cardiovascular;  Laterality: N/A;  . CARDIOVERSION N/A 03/11/2020   Procedure: CARDIOVERSION;  Surgeon: [name], MD;  Location: [facility];  Service: Cardiovascular;  Laterality: N/A;  . excision of actinic keratosis    .  EYE SURGERY     eye lift  . IR CT HEAD LTD  01/27/2020  . IR PERCUTANEOUS ART THROMBECTOMY/INFUSION INTRACRANIAL INC DIAG ANGIO  01/27/2020      . IR PERCUTANEOUS ART THROMBECTOMY/INFUSION INTRACRANIAL INC DIAG ANGIO  01/27/2020  . RADIOLOGY WITH ANESTHESIA N/A 01/27/2020   Procedure: IR WITH ANESTHESIA;  Surgeon: Radiologist, Medication, MD;  Location: [facility];  Service: Radiology;  Laterality: N/A;  . TEE WITHOUT CARDIOVERSION N/A 03/11/2020   Procedure: TRANSESOPHAGEAL ECHOCARDIOGRAM (TEE);  Surgeon: [name], MD;  Location: [facility];  Service: Cardiovascular;  Laterality: N/A;  . TONSILLECTOMY    . WISDOM TOOTH EXTRACTION      There were no vitals filed for this visit.   Subjective Assessment - 01/16/21 1647    Subjective Caregiver, [name], reports pt does not ambulate at all without R AFO and shoes,even around house.  Dons before getting out of bed or up from seat after showering.    Patient is accompained by: --   gf, [name]   Pertinent History PMH: HTN, paroxysmal a fib    Limitations Standing;Walking    Patient Stated Goals "wants everything working on R side again"    Currently in Pain? No/denies           Aquatic therapy at [facility] - pool temp 88 degrees  Patient seen for aquatic therapy today.  Treatment took place in water 3.6-4.6 feet deep depending upon activity.  Placed chair at pool edge and pt removed shoes/AFO.  Donned  air cast to R ankle before entering pool.  Pt entered the pool via step negotiation with min assist for R foot proper positioning due to increased tone/spasticity - with use of hand rail LUE.  Gait training with pt holding pool edge vs HHA of PTA and min assist with moderate verbal cues for RLE/foot positioning before stepping.  [name] R foot tending to invert and supinate without cues.    Pt seated on pool bench for R LAQ and hip flexion x 15 reps x 2 sets AAROM.  Pt with difficulty maintaining seated position on bench due to buoyancy.   Sit<>stand from bench x 15 reps with min assist and cues to keep R foot positioned.  Placed neck noodle, waist floatation and pool noodle under hips and assisted pt to supine position.  Pt performed bil hip abd/add x 20 reps x 2 sets.  Attempted bicycling LE's with increased difficulty with RLE.  PTA moved to pt's LE's and assisted with hip/knee flexion on R.   Pt performed squats - bil. LE's 10 reps:    Pt requires buoyancy of water for support for safety with gait training and with standing balance; viscosity of water needed for resistance for strengthening       PT Short Term Goals - 12/26/20 2009      PT SHORT TERM GOAL #1   Title Pt will decrease TUG with cane from 17.4 sec to < 16 sec for improved balance and functional mobility. ALL STGS DUE 01/22/21    Baseline 17.4 seconds    Time 4    Period Weeks    Status New    Target Date 01/22/21      PT SHORT TERM GOAL #2   Title Pt  will undergo further assessment of 6MWT with LTG to be written as appropriate.    Baseline 12/26/20 6 MWT completed    Time 4    Period Weeks    Status Achieved      PT SHORT TERM GOAL #3   Title Pt will increase gait speed from 0.53 m/s with cane to >0.72m/s for improved mobility.    Baseline .53 m/s  or 18.8 seconds    Time 4    Period Weeks    Status New      PT SHORT TERM GOAL #4   Title Pt will ambulate at least 50' over outdoor grass surfaces with cane and supervision in order to demo improved safety ambulating in his yard.    Baseline needs min guard.    Time 4    Period Weeks    Status New             PT Long Term Goals - 12/26/20 2010      PT LONG TERM GOAL #1   Title Pt will ambulate at least 200' over grass, gravel, and mulch surfaces with cane and supervision in order to demo improved community mobility on unlevel surfaces. ALL LTGS DUE 02/19/21    Time 8    Period Weeks    Status New      PT LONG TERM GOAL #2   Title Pt will increase 6 min walk from 540' to >650' to  demo improved gait endurance.    Baseline 12/26/20 540'    Time 8    Period Weeks    Status New      PT LONG TERM GOAL #3   Title Pt will perform fall recovery with supervision (and possible set up assist  for RLE to come to stand) in order to demo improved safety with transfers if pt has a fall.    Time 8    Period Weeks    Status New      PT LONG TERM GOAL #4   Title Pt will ambulate up/down 4 steps with LRAD and mod I for improved access in home to living room    Baseline with cane and supervision    Time 8    Period Weeks    Status New      PT LONG TERM GOAL #5   Title Pt will improve condition 4 of mCTSIB to at least 10 seconds in order to demo improved vestibular input for balance.    Baseline 4 seconds    Time 8    Period Weeks    Status New                 Plan - 01/16/21 1649    Clinical Impression Statement Pt with difficulty with gait and weight bearing on RLE in water due to inversion and supination of R foot which increases as tone increases.  Tried ankle aircast for ankle support but did not seem to provide enough support.  Discussed having pt bring tennis shoes that he could wear in pool next session and PTA would get sample AFO from clinic to try in pool.  Cont per poc.    Personal Factors and Comorbidities Comorbidity 3+    Comorbidities dense L MCA CVA, paroxysmal A. fib on chronic Xarelto, nonischemic cardiomyopathy and hypertension.    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 goals due at end of next week.  tall kneeling/half kneeling activities to pt's tolerance. Continue with BWS for incr hip flexion during gait, R stance time and slowing down. gait outdoors on grass/gravel with cane. balance strategies on compliant surfacces. activites to for incr stance time on RLE and getting more hip extension.    PT Home Exercise Plan on 06/28/20 - added standing at countetop and stepping LLE out and in    Consulted and Agree with Plan of Care Patient;Family member/caregiver    Family Member Consulted [name]           Patient will benefit from skilled therapeutic intervention in order to improve the following deficits and impairments:  Abnormal gait,Decreased activity tolerance,Decreased balance,Decreased cognition,Decreased mobility,Decreased coordination,Decreased range of motion,Decreased endurance,Decreased strength,Hypomobility,Difficulty walking,Impaired UE functional use,Impaired vision/preception,Postural dysfunction,Impaired sensation  Visit Diagnosis: Other abnormalities of gait and mobility  Muscle weakness (generalized)  Unsteadiness on feet     Problem List Patient Active Problem List   Diagnosis Date Noted  . Dysarthria, post-stroke 11/03/2020  . Benign essential HTN 06/09/2020  . Abnormality of gait 04/28/2020  . Neurogenic bladder 03/17/2020  . Sepsis due to pneumonia ([place]) 03/06/2020  . History of stroke with residual deficit 03/06/2020  . Transient hypotension 03/06/2020  . Spastic hemiplegia affecting nondominant side ([place])   . History of hypertension   . Expressive aphasia   . AKI (  acute kidney injury) ([place])   . Global aphasia   . Hypoalbuminemia due to  protein-calorie malnutrition ([place])   . Dysphagia, post-stroke   . Hyperlipidemia 02/02/2020  . Dysphagia following cerebral infarction 02/02/2020  . Aspiration pneumonia ([place]) 02/02/2020  . Acute ischemic left MCA stroke ([place]) 02/02/2020  . Acute respiratory failure ([place])   . Acute ischemic stroke [facility]) s/p clot retrieval L MCA & ACA A3 01/27/2020  . Aspiration into airway 01/27/2020  . Chronic anticoagulation 01/27/2020  . Paroxysmal atrial fibrillation ([place]) 06/15/2018  . Essential hypertension 06/15/2018  . NICM (nonischemic cardiomyopathy) ([place]) 06/15/2018  . Visit for monitoring Tikosyn therapy 06/12/2018    Narda Bonds, [place] 01/16/21 5:01 PM Phone: [phone] Fax: [facility] [address] [place], [place], [address] Phone: [phone]   Fax:  [phone]  Name: Dustin Schneider MRN: [id number] Date of Birth: 28-Aug-1955

## 2021-01-20 ENCOUNTER — Ambulatory Visit: Payer: BC Managed Care – PPO | Admitting: Physical Therapy

## 2021-01-20 ENCOUNTER — Other Ambulatory Visit: Payer: Self-pay

## 2021-01-20 ENCOUNTER — Encounter: Payer: Self-pay | Admitting: Physical Therapy

## 2021-01-20 DIAGNOSIS — M6281 Muscle weakness (generalized): Secondary | ICD-10-CM

## 2021-01-20 DIAGNOSIS — I69351 Hemiplegia and hemiparesis following cerebral infarction affecting right dominant side: Secondary | ICD-10-CM

## 2021-01-20 DIAGNOSIS — R2689 Other abnormalities of gait and mobility: Secondary | ICD-10-CM

## 2021-01-20 DIAGNOSIS — R2681 Unsteadiness on feet: Secondary | ICD-10-CM

## 2021-01-20 NOTE — Therapy (Addendum)
[facility] [address] [place], [place], [address] Phone: [phone]   Fax:  [phone]  Physical Therapy Treatment  Patient Details  Name: Dustin Schneider MRN: [id number] Date of Birth: 01-Aug-1956 Referring Provider (PT): Lauraine Rinne, PA-C but followed by Delice Lesch   Encounter Date: 01/20/2021   PT End of Session - 01/20/21 1529     Visit Number 39    Number of Visits 28    Authorization Type BCBS    PT Start Time 9622    PT Stop Time 2979    PT Time Calculation (min) 40 min    Activity Tolerance Patient tolerated treatment well    Behavior During Therapy [facility] for tasks assessed/performed             Past Medical History:  Diagnosis Date   Hypertension    NICM (nonischemic cardiomyopathy) ([place]) 06/15/2018   04/23/18: EF 45%, 09/15/18: NOrmal LVEF   Paroxysmal atrial fibrillation ([place]) 06/15/2018   Stroke ([place])    Visit for monitoring Tikosyn therapy 06/12/2018    Past Surgical History:  Procedure Laterality Date   BUBBLE STUDY  03/11/2020   Procedure: BUBBLE STUDY;  Surgeon: [name], MD;  Location: [facility];  Service: Cardiovascular;;   CARDIOVERSION N/A 05/13/2018   Procedure: CARDIOVERSION;  Surgeon: [name], MD;  Location: [facility];  Service: Cardiovascular;  Laterality: N/A;   CARDIOVERSION N/A 06/13/2018   Procedure: CARDIOVERSION;  Surgeon: [name], MD;  Location: [facility];  Service: Cardiovascular;  Laterality: N/A;   CARDIOVERSION N/A 06/23/2018   Procedure: CARDIOVERSION;  Surgeon: [name], MD;  Location: [place];  Service: Cardiovascular;  Laterality: N/A;   CARDIOVERSION N/A 03/11/2020   Procedure: CARDIOVERSION;  Surgeon: [name], MD;  Location: [facility];  Service: Cardiovascular;  Laterality: N/A;   excision of actinic keratosis     EYE SURGERY     eye lift   IR CT HEAD LTD  01/27/2020   IR PERCUTANEOUS ART THROMBECTOMY/INFUSION INTRACRANIAL INC  DIAG ANGIO  01/27/2020       IR PERCUTANEOUS ART THROMBECTOMY/INFUSION INTRACRANIAL INC DIAG ANGIO  01/27/2020   RADIOLOGY WITH ANESTHESIA N/A 01/27/2020   Procedure: IR WITH ANESTHESIA;  Surgeon: Radiologist, Medication, MD;  Location: [facility];  Service: Radiology;  Laterality: N/A;   TEE WITHOUT CARDIOVERSION N/A 03/11/2020   Procedure: TRANSESOPHAGEAL ECHOCARDIOGRAM (TEE);  Surgeon: [name], MD;  Location: [facility];  Service: Cardiovascular;  Laterality: N/A;   TONSILLECTOMY     WISDOM TOOTH EXTRACTION      There were no vitals filed for this visit.   Subjective Assessment - 01/20/21 1407     Subjective Pool was good, was a different kind of tired afterwards. No falls.    Pertinent History PMH: HTN, paroxysmal a fib    Limitations Standing;Walking    Patient Stated Goals "wants everything working on R side again"    Currently in Pain? No/denies                                01/20/21 1414  Ambulation/Gait  Ambulation/Gait Yes  Ambulation/Gait Assistance 5: Supervision;4: Min guard  Ambulation/Gait Assistance Details performed gait outdoors over unlevel paved and grass surfaces, pt needing initial supervision on grass, therapist providing assist while ambulating on grass for manual cues to incr weight shift to R side for longer step length with LLE. pt doing an improved job ambulating with  incr step length with LLE on paved surfaces  Ambulation Distance (Feet) 500 Feet (plus additional distances in clinic)  Assistive device Straight cane (with 6 prong tip)  Gait Pattern Step-through pattern;Decreased stance time - right;Decreased hip/knee flexion - right  Ambulation Surface Level;Indoor  Gait velocity 20.43 seconds = .50 m/s  Therapeutic Activites   Therapeutic Activities Other Therapeutic Activities  Other Therapeutic Activities discussed scheduling aquatic appt on monday, and moving friday appt to earlier time at 12:30 vs. 2 pm. pt's caregiver and pt  asking about getting emergency contact back on pt's apple watch home screen, unable to do today as pt did not have his phone with him,  but pt to bring his phone to next session for therapist to help assist  Neuro Re-ed   Neuro Re-ed Details  NMR: in tall kneeling on mat table with LUE support on kaye bench and pt faced sideways, performed weight shifting to R and then performing L hip flexion and then back to midline, verbal and tactile cues for weight shift first and posture x10 reps, with bench in front of pt - performed weight shifting to RLE (with RUE weight bearing on bench) and pt reaching towards targets (therapist hand) towards R, cues to look first with head and eyes x10 reps      01/22/21 0001  Balance Exercises: Standing  Other Standing Exercises with use of cane: standing up incline forward stepping and back to midline with LLE x10 reps for incr weight shift and step length, then performed down incline x15 reps        PT Education - 01/20/21 1529     Education Details scheduling for aquatics, moving pt's appt next friday to an earlier time.    Person(s) Educated Patient;Caregiver(s)    Methods Explanation    Comprehension Verbalized understanding              PT Short Term Goals - 01/20/21 1414       PT SHORT TERM GOAL #1   Title Pt will decrease TUG with cane from 17.4 sec to < 16 sec for improved balance and functional mobility. ALL STGS DUE 01/22/21    Baseline 17.4 seconds; 17.2 seconds on 01/20/21    Time 4    Period Weeks    Status Not Met    Target Date 01/22/21      PT SHORT TERM GOAL #2   Title Pt  will undergo further assessment of 6MWT with LTG to be written as appropriate.    Baseline 12/26/20 6 MWT completed    Time 4    Period Weeks    Status Achieved      PT SHORT TERM GOAL #3   Title Pt will increase gait speed from 0.53 m/s with cane to >0.76ms for improved mobility.    Baseline 18.8 seconds = .53 m/s; 20.43 seconds = .50 m/s on 01/20/21     Time 4    Period Weeks    Status Not Met      PT SHORT TERM GOAL #4   Title Pt will ambulate at least 50' over outdoor grass surfaces with cane and supervision in order to demo improved safety ambulating in his yard.    Baseline met on 01/22/21    Time 4    Period Weeks    Status Achieved               PT Long Term Goals - 12/26/20 2010  PT LONG TERM GOAL #1   Title Pt will ambulate at least 200' over grass, gravel, and mulch surfaces with cane and supervision in order to demo improved community mobility on unlevel surfaces. ALL LTGS DUE 02/19/21    Time 8    Period Weeks    Status New      PT LONG TERM GOAL #2   Title Pt will increase 6 min walk from 540' to >650' to demo improved gait endurance.    Baseline 12/26/20 540'    Time 8    Period Weeks    Status New      PT LONG TERM GOAL #3   Title Pt will perform fall recovery with supervision (and possible set up assist for RLE to come to stand) in order to demo improved safety with transfers if pt has a fall.    Time 8    Period Weeks    Status New      PT LONG TERM GOAL #4   Title Pt will ambulate up/down 4 steps with LRAD and mod I for improved access in home to living room    Baseline with cane and supervision    Time 8    Period Weeks    Status New      PT LONG TERM GOAL #5   Title Pt will improve condition 4 of mCTSIB to at least 10 seconds in order to demo improved vestibular input for balance.    Baseline 4 seconds    Time 8    Period Weeks    Status New                01/22/21 1311  Plan  Clinical Impression Statement Assessed pt's STGs today. Pt did not meet STG in regards to gait speed, pt's gait speed was .50 m/s today and previously .53 m/s. However, pt's quality of gait has improved with improving stance time on RLE. [name] TUG time did not change since last time was assessed, still putting pt at an incr risk for falls. Pt did meet STG #3 in regards to gait outdoors on grass. Remainder of  session  focused on NMR for incr weight shifting to RLE on inclines and in tall kneeling. Will continue to progress towards LTGs.  Personal Factors and Comorbidities Comorbidity 3+  Comorbidities dense L MCA CVA, paroxysmal A. fib on chronic Xarelto, nonischemic cardiomyopathy and hypertension.  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, works as an Gaffer)  Pt will benefit from skilled therapeutic intervention in order to improve on the following deficits Abnormal gait;Decreased activity tolerance;Decreased balance;Decreased cognition;Decreased mobility;Decreased coordination;Decreased range of motion;Decreased endurance;Decreased strength;Hypomobility;Difficulty walking;Impaired UE functional use;Impaired vision/preception;Postural dysfunction;Impaired sensation  Stability/Clinical Decision Making Evolving/Moderate complexity  Rehab Potential Good  PT Frequency 2x / week  PT Duration 8 weeks  PT Treatment/Interventions ADLs/Self Care Home Management;Aquatic Therapy;DME Instruction;Gait training;Stair training;Functional mobility training;Therapeutic activities;Therapeutic exercise;Electrical Stimulation;Balance training;Neuromuscular re-education;Wheelchair mobility training;Orthotic Fit/Training;Patient/family education;Passive range of motion;Energy conservation  PT Next Visit Plan tall kneeling/half kneeling activities to pt's tolerance. Continue with BWS for incr hip flexion during gait, R stance time and slowing down. gait outdoors on grass/gravel with cane. balance strategies on compliant surfacces. activites to for incr stance time on RLE and getting more hip extension.  PT Home Exercise Plan on 06/28/20 - added standing at countetop and stepping LLE out and in  Consulted and Agree with Plan of Care  Patient;Family member/caregiver  Family Member Consulted  [name]        Patient will benefit from skilled therapeutic intervention in order to improve the following deficits and impairments:     Visit Diagnosis: Other abnormalities of gait and mobility  Unsteadiness on feet  Muscle weakness (generalized)  Hemiplegia and hemiparesis following cerebral infarction affecting right dominant side [facility])     Problem List Patient Active Problem List   Diagnosis Date Noted   Dysarthria, post-stroke 11/03/2020   Benign essential HTN 06/09/2020   Abnormality of gait 04/28/2020   Neurogenic bladder 03/17/2020   Sepsis due to pneumonia ([place]) 03/06/2020   History of stroke with residual deficit 03/06/2020   Transient hypotension 03/06/2020   Spastic hemiplegia affecting nondominant side ([place])    History of hypertension    Expressive aphasia    AKI (acute kidney injury) ([place])    Global aphasia    Hypoalbuminemia due to protein-calorie malnutrition ([place])    Dysphagia, post-stroke    Hyperlipidemia 02/02/2020   Dysphagia following cerebral infarction 02/02/2020   Aspiration pneumonia ([place]) 02/02/2020   Acute ischemic left MCA stroke ([place]) 02/02/2020   Acute respiratory failure ([place])    Acute ischemic stroke ([place]) s/p clot retrieval L MCA & ACA A3 01/27/2020   Aspiration into airway 01/27/2020   Chronic anticoagulation 01/27/2020   Paroxysmal atrial fibrillation ([place]) 06/15/2018   Essential hypertension 06/15/2018   NICM (nonischemic cardiomyopathy) ([place]) 06/15/2018   Visit for monitoring Tikosyn therapy 06/12/2018    Arliss Journey, PT, DPT  01/20/2021, 3:31 PM  [facility] [name] [place], [place], [address] Phone: [phone]   Fax:  [phone]  Name: JOSIAS TOMERLIN MRN: [id number] Date of Birth: Dec 17, 1955

## 2021-01-23 ENCOUNTER — Encounter: Payer: Self-pay | Admitting: Physical Therapy

## 2021-01-23 ENCOUNTER — Ambulatory Visit: Payer: BC Managed Care – PPO | Admitting: Physical Therapy

## 2021-01-23 ENCOUNTER — Other Ambulatory Visit: Payer: Self-pay

## 2021-01-23 DIAGNOSIS — R2681 Unsteadiness on feet: Secondary | ICD-10-CM

## 2021-01-23 DIAGNOSIS — R2689 Other abnormalities of gait and mobility: Secondary | ICD-10-CM

## 2021-01-23 DIAGNOSIS — M6281 Muscle weakness (generalized): Secondary | ICD-10-CM

## 2021-01-23 NOTE — Therapy (Signed)
[facility] [address] [place], [place], [address] Phone: [phone]   Fax:  [phone]  Physical Therapy Treatment  Patient Details  Name: Dustin Schneider MRN: [id number] Date of Birth: 1955/09/18 Referring Provider (PT): Lauraine Rinne, PA-C but followed by Delice Lesch   Encounter Date: 01/23/2021   PT End of Session - 01/23/21 1155     Visit Number 63    Number of Visits 42    Authorization Type BCBS    PT Start Time 1025   pt arrived late to session   PT Stop Time 1100    PT Time Calculation (min) 35 min    Equipment Utilized During Treatment Gait belt    Activity Tolerance Patient tolerated treatment well    Behavior During Therapy [facility] for tasks assessed/performed             Past Medical History:  Diagnosis Date   Hypertension    NICM (nonischemic cardiomyopathy) ([place]) 06/15/2018   04/23/18: EF 45%, 09/15/18: NOrmal LVEF   Paroxysmal atrial fibrillation ([place]) 06/15/2018   Stroke ([place])    Visit for monitoring Tikosyn therapy 06/12/2018    Past Surgical History:  Procedure Laterality Date   BUBBLE STUDY  03/11/2020   Procedure: BUBBLE STUDY;  Surgeon: [name], MD;  Location: [place];  Service: Cardiovascular;;   CARDIOVERSION N/A 05/13/2018   Procedure: CARDIOVERSION;  Surgeon: [name], MD;  Location: [place];  Service: Cardiovascular;  Laterality: N/A;   CARDIOVERSION N/A 06/13/2018   Procedure: CARDIOVERSION;  Surgeon: [name], MD;  Location: [place];  Service: Cardiovascular;  Laterality: N/A;   CARDIOVERSION N/A 06/23/2018   Procedure: CARDIOVERSION;  Surgeon: [name], MD;  Location: [place];  Service: Cardiovascular;  Laterality: N/A;   CARDIOVERSION N/A 03/11/2020   Procedure: CARDIOVERSION;  Surgeon: [name], MD;  Location: [place];  Service: Cardiovascular;  Laterality: N/A;   excision of actinic keratosis     EYE SURGERY     eye lift   IR CT HEAD  LTD  01/27/2020   IR PERCUTANEOUS ART THROMBECTOMY/INFUSION INTRACRANIAL INC DIAG ANGIO  01/27/2020       IR PERCUTANEOUS ART THROMBECTOMY/INFUSION INTRACRANIAL INC DIAG ANGIO  01/27/2020   RADIOLOGY WITH ANESTHESIA N/A 01/27/2020   Procedure: IR WITH ANESTHESIA;  Surgeon: Radiologist, Medication, MD;  Location: [place];  Service: Radiology;  Laterality: N/A;   TEE WITHOUT CARDIOVERSION N/A 03/11/2020   Procedure: TRANSESOPHAGEAL ECHOCARDIOGRAM (TEE);  Surgeon: [name], MD;  Location: [place];  Service: Cardiovascular;  Laterality: N/A;   TONSILLECTOMY     WISDOM TOOTH EXTRACTION      There were no vitals filed for this visit.   Subjective Assessment - 01/23/21 1028     Subjective No changes since he was last here. No falls.    Pertinent History PMH: HTN, paroxysmal a fib    Limitations Standing;Walking    Patient Stated Goals "wants everything working on R side again"    Currently in Pain? No/denies                               [facility] Adult PT Treatment/Exercise - 01/23/21 1049       Ambulation/Gait   Ambulation/Gait Yes    Ambulation/Gait Assistance 5: Supervision    Ambulation/Gait Assistance Details out of clinic and into clinic as well as after BWS - cues to maintain incr step length with LLE and  for posture    Assistive device Straight cane   with 6 prong tip   Gait Pattern Step-through pattern;Decreased stance time - right;Decreased hip/knee flexion - right    Ambulation Surface Level;Indoor    Stairs Yes    Stairs Assistance 5: Supervision    Stairs Assistance Details (indicate cue type and reason) pt asking about going over to his neighbor's house  (pt's neighbor also uses a cane)that has 4 steps to ascend and descend with a railing on the L, pt has tried this before and descended down backwards. practiced technique today with cues for slowed pace and making sure that RLE is fully placed on the step each time. discussed that pt would need someone to  walk over and perform the stairs with him with supervision due to hx of a fall going up the stairs when too tired and pt should not be doing this on his own.    Stair Management Technique Step to pattern;Backwards;One rail Left    Number of Stairs 16   4 steps x 4 reps   Height of Stairs 6    Gait Comments BWS over treadmill x 13 min at 1.74mh (unweighted approx. 18#) with PT facilitating for incr R hip/knee flexion as well as verbal cues for R hip extension and longer stance time on RLE for incr step length with LLE with pt better able to clear RLE.      Therapeutic Activites    Therapeutic Activities Other Therapeutic Activities    Other Therapeutic Activities pt's gf [name] asking about using rollator for gait, discussed that due to hemiplegia on RLE and unable to use the brake on the R side, discussed that this would not be the safest option for pt due to strength deficits and balance.                      PT Short Term Goals - 01/20/21 1414       PT SHORT TERM GOAL #1   Title Pt will decrease TUG with cane from 17.4 sec to < 16 sec for improved balance and functional mobility. ALL STGS DUE 01/22/21    Baseline 17.4 seconds; 17.2 seconds on 01/20/21    Time 4    Period Weeks    Status Not Met    Target Date 01/22/21      PT SHORT TERM GOAL #2   Title Pt  will undergo further assessment of 6MWT with LTG to be written as appropriate.    Baseline 12/26/20 6 MWT completed    Time 4    Period Weeks    Status Achieved      PT SHORT TERM GOAL #3   Title Pt will increase gait speed from 0.53 m/s with cane to >0.675m for improved mobility.    Baseline 18.8 seconds = .53 m/s; 20.43 seconds = .50 m/s on 01/20/21    Time 4    Period Weeks    Status Not Met      PT SHORT TERM GOAL #4   Title Pt will ambulate at least 50' over outdoor grass surfaces with cane and supervision in order to demo improved safety ambulating in his yard.    Baseline met on 01/22/21    Time 4    Period  Weeks    Status Achieved               PT Long Term Goals - 12/26/20 2010  PT LONG TERM GOAL #1   Title Pt will ambulate at least 200' over grass, gravel, and mulch surfaces with cane and supervision in order to demo improved community mobility on unlevel surfaces. ALL LTGS DUE 02/19/21    Time 8    Period Weeks    Status New      PT LONG TERM GOAL #2   Title Pt will increase 6 min walk from 540' to >650' to demo improved gait endurance.    Baseline 12/26/20 540'    Time 8    Period Weeks    Status New      PT LONG TERM GOAL #3   Title Pt will perform fall recovery with supervision (and possible set up assist for RLE to come to stand) in order to demo improved safety with transfers if pt has a fall.    Time 8    Period Weeks    Status New      PT LONG TERM GOAL #4   Title Pt will ambulate up/down 4 steps with LRAD and mod I for improved access in home to living room    Baseline with cane and supervision    Time 8    Period Weeks    Status New      PT LONG TERM GOAL #5   Title Pt will improve condition 4 of mCTSIB to at least 10 seconds in order to demo improved vestibular input for balance.    Baseline 4 seconds    Time 8    Period Weeks    Status New                   Plan - 01/23/21 1157     Clinical Impression Statement Progressed time with BWS on treadmill today. Pt able to ambulate for 13 minutes today at 1.0 mph. Therapist providing assist for RLE foot clearance and incr hip/knee flexion. When pt incr stance time on RLE with incr step length with LLE, pt with less tone with RLE kicking in when trying to clear foot. Will continue to progress towards LTGs.    Personal Factors and Comorbidities Comorbidity 3+    Comorbidities dense L MCA CVA, paroxysmal A. fib on chronic Xarelto, nonischemic cardiomyopathy and hypertension.    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 Continue with BWS for incr hip flexion during gait, R stance time and slowing down. gait outdoors on grass/gravel with cane. balance strategies on compliant surfacces. activites to for incr stance time on RLE and getting more hip extension.    PT Home Exercise Plan on 06/28/20 - added standing at countetop and stepping LLE out and in    Consulted and Agree with Plan of Care Patient;Family member/caregiver    Family Member Consulted [name]             Patient will benefit from skilled therapeutic intervention in order to improve the following deficits and impairments:  Abnormal gait, Decreased activity tolerance, Decreased balance, Decreased  cognition, Decreased mobility, Decreased coordination, Decreased range of motion, Decreased endurance, Decreased strength, Hypomobility, Difficulty walking, Impaired UE functional use, Impaired vision/preception, Postural dysfunction, Impaired sensation  Visit Diagnosis: Other abnormalities of gait and mobility  Muscle weakness (generalized)  Unsteadiness on feet     Problem List Patient Active Problem List   Diagnosis Date Noted   Dysarthria, post-stroke 11/03/2020   Benign essential HTN 06/09/2020   Abnormality of gait 04/28/2020    Neurogenic bladder 03/17/2020   Sepsis due to pneumonia ([place]) 03/06/2020   History of stroke with residual deficit 03/06/2020   Transient hypotension 03/06/2020   Spastic hemiplegia affecting nondominant side ([place])    History of hypertension    Expressive aphasia    AKI (acute kidney injury) ([place])    Global aphasia    Hypoalbuminemia due to protein-calorie malnutrition ([place])    Dysphagia, post-stroke    Hyperlipidemia 02/02/2020   Dysphagia following cerebral infarction 02/02/2020   Aspiration pneumonia ([place]) 02/02/2020   Acute ischemic left MCA stroke ([place]) 02/02/2020   Acute respiratory failure ([place])    Acute ischemic stroke ([place]) s/p clot retrieval L MCA & ACA A3 01/27/2020   Aspiration into airway 01/27/2020   Chronic anticoagulation 01/27/2020   Paroxysmal atrial fibrillation ([place]) 06/15/2018   Essential hypertension 06/15/2018   NICM (nonischemic cardiomyopathy) ([place]) 06/15/2018   Visit for monitoring Tikosyn therapy 06/12/2018    Arliss Journey, PT, DPT  01/23/2021, 12:22 PM  [facility] [address] [place], [place], [address] Phone: [phone]   Fax:  [phone]  Name: Dustin Schneider MRN: [id number] Date of Birth: Mar 14, 1956

## 2021-01-27 ENCOUNTER — Other Ambulatory Visit: Payer: Self-pay

## 2021-01-27 ENCOUNTER — Ambulatory Visit: Payer: BC Managed Care – PPO | Admitting: Physical Therapy

## 2021-01-27 ENCOUNTER — Encounter: Payer: Self-pay | Admitting: Physical Therapy

## 2021-01-27 DIAGNOSIS — I69351 Hemiplegia and hemiparesis following cerebral infarction affecting right dominant side: Secondary | ICD-10-CM

## 2021-01-27 DIAGNOSIS — R2689 Other abnormalities of gait and mobility: Secondary | ICD-10-CM

## 2021-01-27 DIAGNOSIS — M6281 Muscle weakness (generalized): Secondary | ICD-10-CM

## 2021-01-27 DIAGNOSIS — R2681 Unsteadiness on feet: Secondary | ICD-10-CM

## 2021-01-27 NOTE — Therapy (Signed)
[facility] [address] [place], [place], [address] Phone: [phone]   Fax:  [phone]  Physical Therapy Treatment  Patient Details  Name: Dustin Schneider MRN: [id number] Date of Birth: 1956-04-17 Referring Provider (PT): Lauraine Rinne, PA-C but followed by Delice Lesch   Encounter Date: 01/27/2021   PT End of Session - 01/27/21 1319     Visit Number 3    Number of Visits 3    Authorization Type BCBS    PT Start Time 1232    PT Stop Time 9678    PT Time Calculation (min) 42 min    Equipment Utilized During Treatment Gait belt    Activity Tolerance Patient tolerated treatment well    Behavior During Therapy [facility] for tasks assessed/performed             Past Medical History:  Diagnosis Date   Hypertension    NICM (nonischemic cardiomyopathy) ([place]) 06/15/2018   04/23/18: EF 45%, 09/15/18: NOrmal LVEF   Paroxysmal atrial fibrillation ([place]) 06/15/2018   Stroke ([place])    Visit for monitoring Tikosyn therapy 06/12/2018    Past Surgical History:  Procedure Laterality Date   BUBBLE STUDY  03/11/2020   Procedure: BUBBLE STUDY;  Surgeon: [name], MD;  Location: [place];  Service: Cardiovascular;;   CARDIOVERSION N/A 05/13/2018   Procedure: CARDIOVERSION;  Surgeon: [name], MD;  Location: [place];  Service: Cardiovascular;  Laterality: N/A;   CARDIOVERSION N/A 06/13/2018   Procedure: CARDIOVERSION;  Surgeon: [name], MD;  Location: [facility];  Service: Cardiovascular;  Laterality: N/A;   CARDIOVERSION N/A 06/23/2018   Procedure: CARDIOVERSION;  Surgeon: [name], MD;  Location: [place];  Service: Cardiovascular;  Laterality: N/A;   CARDIOVERSION N/A 03/11/2020   Procedure: CARDIOVERSION;  Surgeon: [name], MD;  Location: [place];  Service: Cardiovascular;  Laterality: N/A;   excision of actinic keratosis     EYE SURGERY     eye lift   IR CT HEAD LTD  01/27/2020   IR  PERCUTANEOUS ART THROMBECTOMY/INFUSION INTRACRANIAL INC DIAG ANGIO  01/27/2020       IR PERCUTANEOUS ART THROMBECTOMY/INFUSION INTRACRANIAL INC DIAG ANGIO  01/27/2020   RADIOLOGY WITH ANESTHESIA N/A 01/27/2020   Procedure: IR WITH ANESTHESIA;  Surgeon: Radiologist, Medication, MD;  Location: [place];  Service: Radiology;  Laterality: N/A;   TEE WITHOUT CARDIOVERSION N/A 03/11/2020   Procedure: TRANSESOPHAGEAL ECHOCARDIOGRAM (TEE);  Surgeon: [name], MD;  Location: [facility];  Service: Cardiovascular;  Laterality: N/A;   TONSILLECTOMY     WISDOM TOOTH EXTRACTION      There were no vitals filed for this visit.   Subjective Assessment - 01/27/21 1235     Subjective No changes since he was last here. No falls.    Pertinent History PMH: HTN, paroxysmal a fib    Limitations Standing;Walking    Patient Stated Goals "wants everything working on R side again"    Currently in Pain? No/denies                               [facility] Adult PT Treatment/Exercise - 01/27/21 1257       Ambulation/Gait   Ambulation/Gait Yes    Ambulation/Gait Assistance 5: Supervision    Ambulation/Gait Assistance Details in clinic with focus on incr stance time on RLE and incr step length with LLE, with therapist providing tactile cues at R pelvis, cues throughout for  posture    Ambulation Distance (Feet) 700 Feet    Assistive device Straight cane   with 6 prong tip   Gait Pattern Step-through pattern;Decreased stance time - right;Decreased hip/knee flexion - right    Ambulation Surface Level;Indoor      Therapeutic Activites    Therapeutic Activities Other Therapeutic Activities    Other Therapeutic Activities therapist helped pt get [name] back on his apple watch screen as an emergency contact                 Balance Exercises - 01/27/21 1311       Balance Exercises: Standing   SLS with Vectors Solid surface    SLS with Vectors Limitations shifting to RLE and tapping LLE to 4"  step x10 reps - verbal and tactile cues for weight shift, cues to step LLE as quietly as possible to the floor, keeping LLE on step for modified SLS on RLE x5 reps head turns, x5 reps head nods, then 2 x 5 reps multi directional reaching with LUE to pt's R with therapist's hand as a target, cues to turn head and eyes to spot target. use of mirror as visual cue for posture/weight shift    Step Ups Forward;4 inch;UE support 1    Balance Master: Limits for Stability with blue air ex on top, x10 reps with bringing LLE to step then x10 reps floating LLE, cues for full  weight shift over RLE and posture/glute activation    Sidestepping 3 reps;Limitations    Sidestepping Limitations cues for keeping R toes forward to decr external rotation and incr stance time on LLE, assist for RLE foot clerance to L to prevent hip hiking               PT Education - 01/27/21 1318     Education Details appts for next week and getting scheduled for pool, PT wrote down pt's appt times (per pt request)    Person(s) Educated Patient    Methods Explanation;Handout    Comprehension Verbalized understanding              PT Short Term Goals - 01/20/21 1414       PT SHORT TERM GOAL #1   Title Pt will decrease TUG with cane from 17.4 sec to < 16 sec for improved balance and functional mobility. ALL STGS DUE 01/22/21    Baseline 17.4 seconds; 17.2 seconds on 01/20/21    Time 4    Period Weeks    Status Not Met    Target Date 01/22/21      PT SHORT TERM GOAL #2   Title Pt  will undergo further assessment of 6MWT with LTG to be written as appropriate.    Baseline 12/26/20 6 MWT completed    Time 4    Period Weeks    Status Achieved      PT SHORT TERM GOAL #3   Title Pt will increase gait speed from 0.53 m/s with cane to >0.29ms for improved mobility.    Baseline 18.8 seconds = .53 m/s; 20.43 seconds = .50 m/s on 01/20/21    Time 4    Period Weeks    Status Not Met      PT SHORT TERM GOAL #4   Title Pt  will ambulate at least 50' over outdoor grass surfaces with cane and supervision in order to demo improved safety ambulating in his yard.    Baseline met on 01/22/21    Time  4    Period Weeks    Status Achieved               PT Long Term Goals - 12/26/20 2010       PT LONG TERM GOAL #1   Title Pt will ambulate at least 200' over grass, gravel, and mulch surfaces with cane and supervision in order to demo improved community mobility on unlevel surfaces. ALL LTGS DUE 02/19/21    Time 8    Period Weeks    Status New      PT LONG TERM GOAL #2   Title Pt will increase 6 min walk from 540' to >650' to demo improved gait endurance.    Baseline 12/26/20 540'    Time 8    Period Weeks    Status New      PT LONG TERM GOAL #3   Title Pt will perform fall recovery with supervision (and possible set up assist for RLE to come to stand) in order to demo improved safety with transfers if pt has a fall.    Time 8    Period Weeks    Status New      PT LONG TERM GOAL #4   Title Pt will ambulate up/down 4 steps with LRAD and mod I for improved access in home to living room    Baseline with cane and supervision    Time 8    Period Weeks    Status New      PT LONG TERM GOAL #5   Title Pt will improve condition 4 of mCTSIB to at least 10 seconds in order to demo improved vestibular input for balance.    Baseline 4 seconds    Time 8    Period Weeks    Status New                   Plan - 01/27/21 1326     Clinical Impression Statement Worked on gait distance and endurance around gym with therapist providing tactile cues at R pelvis for incr weight shift to RLE during stance. Remainder of session focused on standing balance strategies in // bars with modified RLE SLS and weight shift. Pt demonstrating improved control with RLE with step taps with LLE to 4" step with no UE support.    Personal Factors and Comorbidities Comorbidity 3+    Comorbidities dense L MCA CVA, paroxysmal A.  fib on chronic Xarelto, nonischemic cardiomyopathy and hypertension.    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 Continue with BWS for incr hip flexion during gait, R stance time and slowing down. gait outdoors on grass/gravel with cane. balance strategies on compliant surfacces. activites to for incr stance time on RLE and getting more hip extension.    PT Home Exercise Plan on 06/28/20 - added standing at countetop and stepping LLE out and in    Consulted and Agree with Plan of Care Patient;Family member/caregiver    Family Member Consulted [place]  Patient will benefit from skilled therapeutic intervention in order to improve the following deficits and impairments:  Abnormal gait, Decreased activity tolerance, Decreased balance, Decreased cognition, Decreased mobility, Decreased coordination, Decreased range of motion, Decreased endurance, Decreased strength, Hypomobility, Difficulty walking, Impaired UE functional use, Impaired vision/preception, Postural dysfunction, Impaired sensation  Visit Diagnosis: Muscle weakness (generalized)  Other abnormalities of gait and  mobility  Unsteadiness on feet  Hemiplegia and hemiparesis following cerebral infarction affecting right dominant side [facility])     Problem List Patient Active Problem List   Diagnosis Date Noted   Dysarthria, post-stroke 11/03/2020   Benign essential HTN 06/09/2020   Abnormality of gait 04/28/2020   Neurogenic bladder 03/17/2020   Sepsis due to pneumonia ([place]) 03/06/2020   History of stroke with residual deficit 03/06/2020   Transient hypotension 03/06/2020   Spastic hemiplegia affecting nondominant side ([place])    History of hypertension    Expressive aphasia    AKI (acute kidney injury) ([place])    Global aphasia    Hypoalbuminemia due to protein-calorie malnutrition ([place])    Dysphagia, post-stroke    Hyperlipidemia 02/02/2020   Dysphagia following cerebral infarction 02/02/2020   Aspiration pneumonia ([place]) 02/02/2020   Acute ischemic left MCA stroke ([place]) 02/02/2020   Acute respiratory failure ([facility])    Acute ischemic stroke ([place]) s/p clot retrieval L MCA & ACA A3 01/27/2020   Aspiration into airway 01/27/2020   Chronic anticoagulation 01/27/2020   Paroxysmal atrial fibrillation ([place]) 06/15/2018   Essential hypertension 06/15/2018   NICM (nonischemic cardiomyopathy) ([place]) 06/15/2018   Visit for monitoring Tikosyn therapy 06/12/2018    Lillia Pauls, DPT 01/27/2021, 1:27 PM  [facility] [address] [place], [place], [address] Phone: [phone]   Fax:  [phone]  Name: Dustin Schneider MRN: [id number] Date of Birth: 1956/03/31

## 2021-01-30 ENCOUNTER — Encounter: Payer: Self-pay | Admitting: Adult Health

## 2021-01-30 ENCOUNTER — Ambulatory Visit: Payer: BC Managed Care – PPO | Attending: Physician Assistant | Admitting: Physical Therapy

## 2021-01-30 ENCOUNTER — Ambulatory Visit (INDEPENDENT_AMBULATORY_CARE_PROVIDER_SITE_OTHER): Payer: BC Managed Care – PPO | Admitting: Adult Health

## 2021-01-30 ENCOUNTER — Other Ambulatory Visit: Payer: Self-pay

## 2021-01-30 VITALS — BP 126/81 | HR 50 | Ht 68.0 in | Wt 154.0 lb

## 2021-01-30 DIAGNOSIS — R2681 Unsteadiness on feet: Secondary | ICD-10-CM

## 2021-01-30 DIAGNOSIS — I10 Essential (primary) hypertension: Secondary | ICD-10-CM

## 2021-01-30 DIAGNOSIS — R2689 Other abnormalities of gait and mobility: Secondary | ICD-10-CM

## 2021-01-30 DIAGNOSIS — M6281 Muscle weakness (generalized): Secondary | ICD-10-CM

## 2021-01-30 DIAGNOSIS — E782 Mixed hyperlipidemia: Secondary | ICD-10-CM | POA: Diagnosis not present

## 2021-01-30 DIAGNOSIS — I480 Paroxysmal atrial fibrillation: Secondary | ICD-10-CM

## 2021-01-30 DIAGNOSIS — Z8673 Personal history of transient ischemic attack (TIA), and cerebral infarction without residual deficits: Secondary | ICD-10-CM | POA: Diagnosis not present

## 2021-01-30 DIAGNOSIS — I69351 Hemiplegia and hemiparesis following cerebral infarction affecting right dominant side: Secondary | ICD-10-CM | POA: Insufficient documentation

## 2021-01-30 NOTE — Progress Notes (Signed)
[facility] [address] [place]. [place] [address] [phone]       STROKE FOLLOW UP NOTE  Dustin Schneider Date of Birth:  1956/07/31 Medical Record Number:  [id number]   Reason for Referral: stroke follow up    SUBJECTIVE:   CHIEF COMPLAINT:  Chief Complaint  Patient presents with   Follow-up    Dustin Schneider with spouse edie  Pt is well, having some r sided complications      HPI:   Today, 01/30/2021, Dustin Schneider returns for 100-month stroke follow-up accompanied by significant other who provides majority of history.  Doing well since prior visit without new stroke/TIA symptoms.  Continues to work with neuro rehab PT and aquatic therapy for right spastic hemiparesis and gait impairment.  Residual aphasia stable and since completed SLP with improvement since prior visit.  Currently using a cane - unfortunately, he has had 3 falls over the past 2 months but thankfully without injury.  No longer using rolling walker.  Compliant on Eliquis and Crestor for secondary stroke prevention without associated side effects.  Blood pressure today 126/81 - does not routinely monitor at home as it is typically stable. Of note, he tested positive for COVID-19 10/25/2020 and received monoclonal antibody treatment  -recovered well without any lasting symptoms.  No new concerns at this time.    History provided for reference purposes only Update 08/01/2020 JM: Dustin Schneider returns for 40-month stroke follow-up accompanied by his significant other.  Continues to work with neuro rehab PT/OT/SLP for residual aphasia, right spastic hemiparesis and gait impairment with ongoing improvement. Has been using estim for RUE with good benefit.  Received Botox injections by Dr. Posey Pronto on 06/21/2020 with benefit. He has been ambulating short distance with RW and AFO brace. Denies new or worsening stroke/TIA symptoms.  Remains on Eliquis and Crestor without side effects.  Blood pressure today 128/75.   No concerns at this time.  Initial visit 03/30/2020 JM: Dustin Schneider is being seen for hospital follow-up accompanied by his brother in law Residual deficits moderate Broca's type aphasia, moderate to severe verbal apraxia, and right hemiparesis Continues to work with outpatient PT/OT/SLP with ongoing improvement Ambulating with therapy with RW  Currently living with his brother-in-law where he receives 24-hour support by brother-in-law and family members Denies new or worsening stroke/TIA symptoms Continues on Eliquis without bleeding or bruising Continues on Crestor without myalgias Blood pressure 124/82 No concerns at this time  Stroke admission 01/27/2020 Dustin Schneider is a 65 y.o. male with history of paroxysmal A. fib on chronic Xarelto, nonischemic cardiomyopathy and hypertension who presented on 01/27/2020 with L forced gaze and R hemiplegia with nausea and vomiting. Stroke work up revealed L MCA and ACA scattered infarcts s/p TICI3 revascularization of L MCA and A3, infarct embolic secondary to known AF on Xarelto. Switched Xarelto to [employer]. Hx of HTN stable with long term BP goal normotensive range. Hx of HLD with LDL 33 and recommended continuation of Crestor 10mg  daily. Other stroke risk factors include NICM w/ EF 45% PTA (currently EF 55-60%) on [place] and advanced age. No prior stroke history. Evaluated by therapy and discharged to CIR for ongoing therapy needs with residual dysphagia, apraxia with limited verbal output, and right hemiparesis.   Stroke:   L MCA and ACA scattered infarcts s/p TICI3 revascularization of L MCA & A3, infarct embolic secondary to known AF on Xarelto Code Stroke CT head Hyperdense L M2. ASPECTS 10.  CTA head & neck L MCA bifurcation occlusion w/ little distal flow. Minimal collateral flow. Cerebral angio distal L M1 occlusion, distal L A3 occlusion. TICI 3 revascularization MCA and ACA  Post IR CT no hemorrhage MRI  L basal ganglia infarct w/  mild edema. L cingulate and L superior frontal lobe infarcts w/ mild cortical edema. Few punctate infarcts R cingulate. Petechial hemorrhage at caudate. Small R cavernous malformation.  MRA  L MCA and ACA open.  2D Echo - EF 55 - 60%. No cardiac source of emboli identified.  LDL 33 HgbA1c - 5.6  Xarelto daily prior to admission, now on aspirin 325 po. switch to eliquis at d/c Therapy recommendations: CIR Disposition:  CIR    ROS:   Schneider system review of systems performed and negative with exception of those listed in HPI  PMH:  Past Medical History:  Diagnosis Date   Hypertension    NICM (nonischemic cardiomyopathy) ([place]) 06/15/2018   04/23/18: EF 45%, 09/15/18: NOrmal LVEF   Paroxysmal atrial fibrillation ([place]) 06/15/2018   Stroke ([place])    Visit for monitoring Tikosyn therapy 06/12/2018    PSH:  Past Surgical History:  Procedure Laterality Date   BUBBLE STUDY  03/11/2020   Procedure: BUBBLE STUDY;  Surgeon: [name], MD;  Location: [place];  Service: Cardiovascular;;   CARDIOVERSION N/A 05/13/2018   Procedure: CARDIOVERSION;  Surgeon: [name], MD;  Location: [place];  Service: Cardiovascular;  Laterality: N/A;   CARDIOVERSION N/A 06/13/2018   Procedure: CARDIOVERSION;  Surgeon: [name], MD;  Location: [facility] ENDOSCOPY;  Service: Cardiovascular;  Laterality: N/A;   CARDIOVERSION N/A 06/23/2018   Procedure: CARDIOVERSION;  Surgeon: [name], MD;  Location: [place];  Service: Cardiovascular;  Laterality: N/A;   CARDIOVERSION N/A 03/11/2020   Procedure: CARDIOVERSION;  Surgeon: [name], MD;  Location: [place];  Service: Cardiovascular;  Laterality: N/A;   excision of actinic keratosis     EYE SURGERY     eye lift   IR CT HEAD LTD  01/27/2020   IR PERCUTANEOUS ART THROMBECTOMY/INFUSION INTRACRANIAL INC DIAG ANGIO  01/27/2020       IR PERCUTANEOUS ART THROMBECTOMY/INFUSION INTRACRANIAL INC DIAG ANGIO  01/27/2020   RADIOLOGY WITH ANESTHESIA N/A 01/27/2020    Procedure: IR WITH ANESTHESIA;  Surgeon: Radiologist, Medication, MD;  Location: [place];  Service: Radiology;  Laterality: N/A;   TEE WITHOUT CARDIOVERSION N/A 03/11/2020   Procedure: TRANSESOPHAGEAL ECHOCARDIOGRAM (TEE);  Surgeon: [name], MD;  Location: [facility] ENDOSCOPY;  Service: Cardiovascular;  Laterality: N/A;   TONSILLECTOMY     WISDOM TOOTH EXTRACTION      Social History:  Social History   Socioeconomic History   Marital status: Divorced    Spouse name: Not on file   Number of children: 1   Years of education: Not on file   Highest education level: Not on file  Occupational History   Not on file  Tobacco Use   Smoking status: Never   Smokeless tobacco: Never  Vaping Use   Vaping Use: Never used  Substance and Sexual Activity   Alcohol use: Not Currently   Drug use: No   Sexual activity: Not on file    Comment: DIVORCE  Other Topics Concern   Not on file  Social History Narrative   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 60 tablet 6   baclofen (LIORESAL) 20 MG tablet Take 1 tablet (20 mg total) by mouth 3 (three) times daily. 90 tablet 3   diltiazem (CARDIZEM CD) 180 MG 24 hr capsule TAKE (1) CAPSULE DAILY. 30 capsule 6   dofetilide (TIKOSYN) 500 MCG capsule Take 1 capsule (500 mcg total) by mouth 2 (two) times daily. 180 capsule 2   metoprolol tartrate (LOPRESSOR)  50 MG tablet TAKE 1 TABLET BY MOUTH TWICE DAILY. 180 tablet 2   rosuvastatin (CRESTOR) 10 MG tablet TAKE 1 TABLET BY MOUTH DAILY AFTER SUPPER 30 tablet 6   No current facility-administered medications on file prior to visit.    Allergies:  No Known Allergies    OBJECTIVE:  Physical Exam  Vitals:   01/30/21 0944  BP: 126/81  Pulse: (!) 50  Weight: 154 lb (69.9 kg)  Height: 5\' 8"  (1.727 m)    Body mass index is 23.42 kg/m. No results found.  General: well developed, well nourished, pleasant middle-age Caucasian man, seated, in no evident distress Head: head normocephalic and atraumatic.   Neck: supple with no carotid or supraclavicular bruits Cardiovascular: regular rate and rhythm, no murmurs Musculoskeletal: no deformity Skin:  no rash/petichiae Vascular:  Normal pulses all extremities   Neurologic Exam Mental Status: Awake and fully alert. Moderate aphasia - able to speak in short, simple sentences.  No evidence of receptive aphasia.  Able to name objects without difficulty.  Oriented to place and time.  Recent and remote memory intact during visit.  Mood and affect appropriate.  Cranial Nerves: Pupils equal, briskly reactive to light. Extraocular movements full without nystagmus. Visual fields full to confrontation. Hearing intact. Facial sensation intact.  Mild right lower facial weakness.  Tongue, and palate moves normally and symmetrically.  Motor: Full strength left upper and lower extremity RUE: 4-/5 deltoid, 4/5 bicep and tricep, 3/5 grip strength; increased tone distally RLE: 4-/5 hip flexor, knee extension and flexion and ankle plantar flexion, 0/5 dorsiflexion foot drop with AFO brace in place Sensory.:  Intact light touch, vibratory and pinprick but subjectively "odd sensation" with light touch Coordination: Rapid alternating movements normal on left side. Finger-to-nose and heel-to-shin performed accurately on left side.  Gait and Station: Able to stand from  seated position without difficulty.  Hemiplegic gait with mild unsteadiness and use of cane.  Tandem walk and heel toe not attempted. Reflexes: 2+ RUE and RLE; 1+ LUE and LLE and symmetric. Toes downgoing.         ASSESSMENT: Dustin Schneider is a 65 y.o. year old male presented with L forced gaze and R hemiplegia on 01/27/2020 with stroke work up revealing L MCA and ACA scattered infarcts s/p TICI3 of L MCA and A3, infarct embolic secondary to known AF on Xarelto therefore switched to Eliquis. Vascular risk factors include HTN, HLD, Afib, NICM and age.      PLAN:  L MCA and ACA stroke :  Residual deficit:  Right spastic hemiparesis, gait impairment, moderate aphasia and right lower facial weakness. Continue working with neuro rehab PT - OV notes personally reviewed.  Followed by Dr. Posey Pronto physical medicine and rehab for spasticity management Continue Eliquis (apixaban) daily  and Crestor  for secondary stroke prevention.  Discussed secondary stroke prevention measures and importance of close PCP follow up for aggressive stroke risk factor management  Atrial fibrillation:  On Eliquis per cardiology seen every 6 months HTN:  BP goal <130/90.  Stable on current therapy per PCP HLD:  LDL goal <70.  Prior LDL 33.  Lipid panel ordered recently placed by cardiology but not yet completed.  Plans to establish care with new PCP next month and plans on obtaining lab work at that time    Follow up in 1 year or call earlier if needed    CC:  [place] provider: Dr. Louann Sjogren, Marvis Repress, FNP     [name], [facility]  [facility] [address] [place], [place] [address]  Phone [phone] Fax [phone] Note: This document was prepared with digital dictation and possible smart phrase technology. Any transcriptional errors that result from this process are unintentional.

## 2021-01-30 NOTE — Patient Instructions (Signed)
Continue Eliquis (apixaban) daily  and atorvastatin  for secondary stroke prevention  Continue working with cardiology for atrial fibrillation and eliquis management  Continue to follow up with PCP regarding cholesterol and blood pressure management  Maintain strict control of hypertension with blood pressure goal below 130/90 and cholesterol with LDL cholesterol (bad cholesterol) goal below 70 mg/dL.       Followup in the future with me in 1 year or call earlier if needed       Thank you for coming to see [place] at [facility]. I hope we have been able to provide you high quality care today.  You may receive a patient satisfaction survey over the next few weeks. We would appreciate your feedback and comments so that we may continue to improve ourselves and the health of our patients.

## 2021-01-31 ENCOUNTER — Ambulatory Visit: Payer: BC Managed Care – PPO | Admitting: Physical Therapy

## 2021-01-31 ENCOUNTER — Encounter: Payer: Self-pay | Admitting: Physical Therapy

## 2021-01-31 NOTE — Therapy (Signed)
[facility] [address] [place], [place], [address] Phone: [phone]   Fax:  [phone]  Physical Therapy Treatment  Patient Details  Name: Dustin Schneider MRN: [id number] Date of Birth: 01/24/1956 Referring Provider (PT): Daniel Angiulli, PA-C but followed by Ankit Patel   Encounter Date: 01/30/2021   PT End of Session - 01/31/21 1532     Visit Number [id number]    Number of Visits 93    Authorization Type BCBS    PT Start Time 1415    PT Stop Time 1500    PT Time Calculation (min) 45 min    Equipment Utilized During Treatment Other (comment)   tennis shoes, clinic AFO   Activity Tolerance Patient tolerated treatment well    Behavior During Therapy WFL for tasks assessed/performed             Past Medical History:  Diagnosis Date   Hypertension    NICM (nonischemic cardiomyopathy) ([facility]) 06/15/2018   04/23/18: EF 45%, 09/15/18: NOrmal LVEF   Paroxysmal atrial fibrillation ([facility]) 06/15/2018   Stroke ([facility])    Visit for monitoring Tikosyn therapy 06/12/2018    Past Surgical History:  Procedure Laterality Date   BUBBLE STUDY  03/11/2020   Procedure: BUBBLE STUDY;  Surgeon: Ganji, Jay, MD;  Location: [facility] ENDOSCOPY;  Service: Cardiovascular;;   CARDIOVERSION N/A 05/13/2018   Procedure: CARDIOVERSION;  Surgeon: Ganji, Jay, MD;  Location: [facility] ENDOSCOPY;  Service: Cardiovascular;  Laterality: N/A;   CARDIOVERSION N/A 06/13/2018   Procedure: CARDIOVERSION;  Surgeon: Nishan, Peter C, MD;  Location: [facility] ENDOSCOPY;  Service: Cardiovascular;  Laterality: N/A;   CARDIOVERSION N/A 06/23/2018   Procedure: CARDIOVERSION;  Surgeon: Ganji, Jay, MD;  Location: [facility] ENDOS[facility];  Service: Cardiovascular;  Laterality: N/A;   CARDIOVERSION N/A 03/11/2020   Procedure: CARDIOVERSION;  Surgeon: Ganji, Jay, MD;  Location: [facility]OS[facility];  Service: Cardiovascular;  Laterality: N/A;   excision of actinic keratosis     EYE SURGERY     eye lift   IR CT  HEAD LTD  01/27/2020   IR PERCUTANEOUS ART THROMBECTOMY/INFUSION INTRACRANIAL INC DIAG ANGIO  01/27/2020       IR PERCUTANEOUS ART THROMBECTOMY/INFUSION INTRACRANIAL INC DIAG ANGIO  01/27/2020   RADIOLOGY WITH ANESTHESIA N/A 01/27/2020   Procedure: IR WITH ANESTHESIA;  Surgeon: Radiologist, Medication, MD;  Location: [facility];  Service: Radiology;  Laterality: N/A;   TEE WITHOUT CARDIOVERSION N/A 03/11/2020   Procedure: TRANSESOPHAGEAL ECHOCARDIOGRAM (TEE);  Surgeon: Ganji, Jay, MD;  Location: [facility] ENDOS[facility];  Service: Cardiovascular;  Laterality: N/A;   TONSILLECTOMY     WISDOM TOOTH EXTRACTION      There were no vitals filed for this visit.   Subjective Assessment - 01/31/21 1531     Subjective Pt denies any falls or changes.  Pt does not feel comfortable standing or ambulating without is AFO.  He did bring shoes to try in water with AFO from PT clinic.    Pertinent History PMH: HTN, paroxysmal a fib    Limitations Standing;Walking    Patient Stated Goals "wants everything working on R side again"    Currently in Pain? No/denies            Aquatic therapy at [facility]   Patient seen for aquatic therapy today.  Treatment took place in water 3.6-4.6 feet deep depending upon activity.  Placed chair at pool edge and pt removed shoes/AFO.  Donned R AFO from therapy clinic and another pair of tennis shoes   that pt could get wet in pool.  Pt entered the pool via step negotiation with min assist for R foot proper positioning due to increased tone/spasticity - with use of hand rail LUE.   Gait training with pt holding pool edge vs HHA of PTA and min assist with moderate verbal cues for RLE/foot positioning before stepping.  [name] R LE tending to externally rotate without cues.  Also provided support at pelvis to decrease posterior rotation on R side.      Pt seated on pool bench for R LAQ and hip flexion x 15 reps x 2 sets AAROM.  Pt with difficulty maintaining seated position on bench due  to buoyancy.  Sit<>stand from bench x 15 reps with min assist and cues to keep R foot positioned.  Placed pt in long sitting on pool bench at short wall so pt had wall behind him and in front.  Assisted pt to place bil feet on wall then pushed pt into bil knee flexion while keeping R foot flat on wall then pushing off wall.  Had another PT present to assist with holding R foot in proper placement.     [name] foot had difficulty staying in clinic AFO as tone causing increased inversion and dorsiflexion.  Discussed pt's goals for aquatics and he would like to be able to do some type of aquatic program with assist of caregiver upon d/c from PT.  Discussed that this PTA will discuss with primary PT regarding other options for R foot stability in pool.   Pt requires buoyancy of water for support for safety with gait training and with standing balance; viscosity of water needed for resistance for strengthening     PT Short Term Goals - 01/20/21 1414       PT SHORT TERM GOAL #1   Title Pt will decrease TUG with cane from 17.4 sec to < 16 sec for improved balance and functional mobility. ALL STGS DUE 01/22/21    Baseline 17.4 seconds; 17.2 seconds on 01/20/21    Time 4    Period Weeks    Status Not Met    Target Date 01/22/21      PT SHORT TERM GOAL #2   Title Pt  will undergo further assessment of 6MWT with LTG to be written as appropriate.    Baseline 12/26/20 6 MWT completed    Time 4    Period Weeks    Status Achieved      PT SHORT TERM GOAL #3   Title Pt will increase gait speed from 0.53 m/s with cane to >0.89ms for improved mobility.    Baseline 18.8 seconds = .53 m/s; 20.43 seconds = .50 m/s on 01/20/21    Time 4    Period Weeks    Status Not Met      PT SHORT TERM GOAL #4   Title Pt will ambulate at least 50' over outdoor grass surfaces with cane and supervision in order to demo improved safety ambulating in his yard.    Baseline met on 01/22/21    Time 4    Period Weeks    Status  Achieved               PT Long Term Goals - 12/26/20 2010       PT LONG TERM GOAL #1   Title Pt will ambulate at least 200' over grass, gravel, and mulch surfaces with cane and supervision in order to demo improved community mobility on unlevel  surfaces. ALL LTGS DUE 02/19/21    Time 8    Period Weeks    Status New      PT LONG TERM GOAL #2   Title Pt will increase 6 min walk from 540' to >650' to demo improved gait endurance.    Baseline 12/26/20 540'    Time 8    Period Weeks    Status New      PT LONG TERM GOAL #3   Title Pt will perform fall recovery with supervision (and possible set up assist for RLE to come to stand) in order to demo improved safety with transfers if pt has a fall.    Time 8    Period Weeks    Status New      PT LONG TERM GOAL #4   Title Pt will ambulate up/down 4 steps with LRAD and mod I for improved access in home to living room    Baseline with cane and supervision    Time 8    Period Weeks    Status New      PT LONG TERM GOAL #5   Title Pt will improve condition 4 of mCTSIB to at least 10 seconds in order to demo improved vestibular input for balance.    Baseline 4 seconds    Time 8    Period Weeks    Status New                    Patient will benefit from skilled therapeutic intervention in order to improve the following deficits and impairments:     Visit Diagnosis: Muscle weakness (generalized)  Other abnormalities of gait and mobility  Unsteadiness on feet  Hemiplegia and hemiparesis following cerebral infarction affecting right dominant side [facility])     Problem List Patient Active Problem List   Diagnosis Date Noted   Dysarthria, post-stroke 11/03/2020   Benign essential HTN 06/09/2020   Abnormality of gait 04/28/2020   Neurogenic bladder 03/17/2020   Sepsis due to pneumonia ([place]) 03/06/2020   History of stroke with residual deficit 03/06/2020   Transient hypotension 03/06/2020   Spastic hemiplegia  affecting nondominant side ([place])    History of hypertension    Expressive aphasia    AKI (acute kidney injury) ([place])    Global aphasia    Hypoalbuminemia due to protein-calorie malnutrition ([place])    Dysphagia, post-stroke    Hyperlipidemia 02/02/2020   Dysphagia following cerebral infarction 02/02/2020   Aspiration pneumonia ([place]) 02/02/2020   Acute ischemic left MCA stroke ([place]) 02/02/2020   Acute respiratory failure ([place])    Acute ischemic stroke ([place]) s/p clot retrieval L MCA & ACA A3 01/27/2020   Aspiration into airway 01/27/2020   Chronic anticoagulation 01/27/2020   Paroxysmal atrial fibrillation ([place]) 06/15/2018   Essential hypertension 06/15/2018   NICM (nonischemic cardiomyopathy) ([place]) 06/15/2018   Visit for monitoring Tikosyn therapy 06/12/2018   Narda Bonds, PTA [place] 01/31/21 4:22 PM Phone: [phone] Fax: [place] [address] [place], [place], [address] Phone: [phone]   Fax:  [phone]  Name: Dustin Schneider MRN: [id number] Date of Birth: 1956-02-15

## 2021-02-03 ENCOUNTER — Ambulatory Visit: Payer: BC Managed Care – PPO | Admitting: Physical Therapy

## 2021-02-03 ENCOUNTER — Other Ambulatory Visit: Payer: Self-pay

## 2021-02-03 ENCOUNTER — Encounter: Payer: Self-pay | Admitting: Physical Therapy

## 2021-02-03 DIAGNOSIS — M6281 Muscle weakness (generalized): Secondary | ICD-10-CM

## 2021-02-03 DIAGNOSIS — R2681 Unsteadiness on feet: Secondary | ICD-10-CM

## 2021-02-03 DIAGNOSIS — I69351 Hemiplegia and hemiparesis following cerebral infarction affecting right dominant side: Secondary | ICD-10-CM

## 2021-02-03 DIAGNOSIS — R2689 Other abnormalities of gait and mobility: Secondary | ICD-10-CM

## 2021-02-03 NOTE — Therapy (Addendum)
[facility] [address] [place], [place], [address] Phone: [phone]   Fax:  [phone]  Physical Therapy Treatment  Patient Details  Name: Dustin Schneider MRN: [id number] Date of Birth: [birth year] Referring Provider (PT): Lauraine Rinne, PA-C but followed by Delice Lesch   Encounter Date: 02/03/2021   PT End of Session - 02/03/21 1449     Visit Number 58    Number of Visits 62    Authorization Type BCBS    PT Start Time 4332    PT Stop Time 9518    PT Time Calculation (min) 42 min    Equipment Utilized During Treatment Gait belt    Activity Tolerance Patient tolerated treatment well    Behavior During Therapy [facility] for tasks assessed/performed             Past Medical History:  Diagnosis Date   Hypertension    NICM (nonischemic cardiomyopathy) ([place]) 06/15/2018   04/23/18: EF 45%, 09/15/18: NOrmal LVEF   Paroxysmal atrial fibrillation ([place]) 06/15/2018   Stroke ([place])    Visit for monitoring Tikosyn therapy 06/12/2018    Past Surgical History:  Procedure Laterality Date   BUBBLE STUDY  03/11/2020   Procedure: BUBBLE STUDY;  Surgeon: [name], MD;  Location: [place];  Service: Cardiovascular;;   CARDIOVERSION N/A 05/13/2018   Procedure: CARDIOVERSION;  Surgeon: [name], MD;  Location: [place];  Service: Cardiovascular;  Laterality: N/A;   CARDIOVERSION N/A 06/13/2018   Procedure: CARDIOVERSION;  Surgeon: [name], MD;  Location: [place];  Service: Cardiovascular;  Laterality: N/A;   CARDIOVERSION N/A 06/23/2018   Procedure: CARDIOVERSION;  Surgeon: [name], MD;  Location: [place];  Service: Cardiovascular;  Laterality: N/A;   CARDIOVERSION N/A 03/11/2020   Procedure: CARDIOVERSION;  Surgeon: [name], MD;  Location: [place];  Service: Cardiovascular;  Laterality: N/A;   excision of actinic keratosis     EYE SURGERY     eye lift   IR CT HEAD LTD  01/27/2020   IR  PERCUTANEOUS ART THROMBECTOMY/INFUSION INTRACRANIAL INC DIAG ANGIO  01/27/2020       IR PERCUTANEOUS ART THROMBECTOMY/INFUSION INTRACRANIAL INC DIAG ANGIO  01/27/2020   RADIOLOGY WITH ANESTHESIA N/A 01/27/2020   Procedure: IR WITH ANESTHESIA;  Surgeon: Radiologist, Medication, MD;  Location: [place];  Service: Radiology;  Laterality: N/A;   TEE WITHOUT CARDIOVERSION N/A 03/11/2020   Procedure: TRANSESOPHAGEAL ECHOCARDIOGRAM (TEE);  Surgeon: [name], MD;  Location: [place];  Service: Cardiovascular;  Laterality: N/A;   TONSILLECTOMY     WISDOM TOOTH EXTRACTION      There were no vitals filed for this visit.   Subjective Assessment - 02/03/21 1410     Subjective Wants to work on his walking. Making progress. No falls.    Pertinent History PMH: HTN, paroxysmal a fib    Limitations Standing;Walking    Patient Stated Goals "wants everything working on R side again"    Currently in Pain? No/denies                                02/03/21 1423  Ambulation/Gait  Ambulation/Gait Yes  Ambulation/Gait Assistance 5: Supervision  Ambulation/Gait Assistance Details indoors x3 laps with manual/tactile cues for posture at trunk and for tactile cues throughout pelvis to incr weight shift to RLE, performed gait outdoors over grass and pavement with continued tactile cues intermittently to incr weight shift to R  Ambulation Distance (Feet) 800 Feet  Assistive device Straight cane  Gait Pattern Step-through pattern;Decreased stance time - right;Decreased hip/knee flexion - right  Ambulation Surface Level;Unlevel;Indoor;Outdoor;Grass  Therapeutic Activites   Therapeutic Activities Other Therapeutic Activities  Other Therapeutic Activities due to pt's incr tone in RLE making it difficult for pt to weight bear through RLE in the pool safely for aquatic therapy, discussed with pt that he can wear his current R AFO in the pool with a different pair of sneakers (PT spoke to pt's  orthotist regarding being able to do this with no issues), as long as he lets his AFO sit out afterwards when at home to let the straps dry      Balance Exercises - 02/03/21 1440       Balance Exercises: Standing   SLS with Vectors Solid surface    SLS with Vectors Limitations shifting to RLE and tapping LLE gently to 4" beam, x10 reps, with cues for weight shift and posture    Rockerboard Anterior/posterior;EO;EC;Lateral    Rockerboard Limitations In A/P direction: EC 3 x 15 seconds with min guard for balance,keeping RLE on step stepping LLE off and on x12 reps with UE support - cues for weight shifting first over RLE, with LUE support forward and cross tapping cones with LLE x10 reps, with rockerboard in lateral direction: working on reaching with LUE across body to grab bean bag form therapist and then give it back to PT for weight shifting to R with intermittent touch to bars for balance and min guard x10 reps               PT Education - 02/03/21 1443     Education Details changing next thursdays appt to friday at [facility]) Educated Patient    Methods Explanation    Comprehension Verbalized understanding              PT Short Term Goals - 01/20/21 1414       PT SHORT TERM GOAL #1   Title Pt will decrease TUG with cane from 17.4 sec to < 16 sec for improved balance and functional mobility. ALL STGS DUE 01/22/21    Baseline 17.4 seconds; 17.2 seconds on 01/20/21    Time 4    Period Weeks    Status Not Met    Target Date 01/22/21      PT SHORT TERM GOAL #2   Title Pt  will undergo further assessment of 6MWT with LTG to be written as appropriate.    Baseline 12/26/20 6 MWT completed    Time 4    Period Weeks    Status Achieved      PT SHORT TERM GOAL #3   Title Pt will increase gait speed from 0.53 m/s with cane to >0.10ms for improved mobility.    Baseline 18.8 seconds = .53 m/s; 20.43 seconds = .50 m/s on 01/20/21    Time 4    Period Weeks    Status Not  Met      PT SHORT TERM GOAL #4   Title Pt will ambulate at least 50' over outdoor grass surfaces with cane and supervision in order to demo improved safety ambulating in his yard.    Baseline met on 01/22/21    Time 4    Period Weeks    Status Achieved               PT Long Term Goals - 12/26/20 2010  PT LONG TERM GOAL #1   Title Pt will ambulate at least 200' over grass, gravel, and mulch surfaces with cane and supervision in order to demo improved community mobility on unlevel surfaces. ALL LTGS DUE 02/19/21    Time 8    Period Weeks    Status New      PT LONG TERM GOAL #2   Title Pt will increase 6 min walk from 540' to >650' to demo improved gait endurance.    Baseline 12/26/20 540'    Time 8    Period Weeks    Status New      PT LONG TERM GOAL #3   Title Pt will perform fall recovery with supervision (and possible set up assist for RLE to come to stand) in order to demo improved safety with transfers if pt has a fall.    Time 8    Period Weeks    Status New      PT LONG TERM GOAL #4   Title Pt will ambulate up/down 4 steps with LRAD and mod I for improved access in home to living room    Baseline with cane and supervision    Time 8    Period Weeks    Status New      PT LONG TERM GOAL #5   Title Pt will improve condition 4 of mCTSIB to at least 10 seconds in order to demo improved vestibular input for balance.    Baseline 4 seconds    Time 8    Period Weeks    Status New                02/05/21 1625  Plan  Clinical Impression Statement Continued to work on gait over indoor and outdoor unlevel surfaces with tactile cues at trunk for posture and at pelvis to incr RLE weight shift, with pt able to perform a step through pattern on grass vs. step to. Remainder of session focused on balance strategies on compliant surfaces and SLS to work on controlled weight shift to RLE. Will continue to progress towards LTGs.  Personal Factors and Comorbidities  Comorbidity 3+  Comorbidities dense L MCA CVA, paroxysmal A. fib on chronic Xarelto, nonischemic cardiomyopathy and hypertension.  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, works as an Gaffer)  Pt will benefit from skilled therapeutic intervention in order to improve on the following deficits Abnormal gait;Decreased activity tolerance;Decreased balance;Decreased cognition;Decreased mobility;Decreased coordination;Decreased range of motion;Decreased endurance;Decreased strength;Hypomobility;Difficulty walking;Impaired UE functional use;Impaired vision/preception;Postural dysfunction;Impaired sensation  Stability/Clinical Decision Making Evolving/Moderate complexity  Rehab Potential Good  PT Frequency 2x / week  PT Duration 8 weeks  PT Treatment/Interventions ADLs/Self Care Home Management;Aquatic Therapy;DME Instruction;Gait training;Stair training;Functional mobility training;Therapeutic activities;Therapeutic exercise;Electrical Stimulation;Balance training;Neuromuscular re-education;Wheelchair mobility training;Orthotic Fit/Training;Patient/family education;Passive range of motion;Energy conservation  PT Next Visit Plan tall kneeling/half kneeling activities to pt's tolerance. Continue with BWS for incr hip flexion during gait, R stance time and slowing down. balance strategies on compliant surfacces. activites to for incr stance time on RLE and getting more hip extension. will need to check goals soon.  PT Home Exercise Plan on 06/28/20 - added standing at countetop and stepping LLE out and in  Consulted and Agree with Plan of Care Patient;Family member/caregiver  Family Member Consulted [name]        Patient will benefit from skilled therapeutic intervention in order to improve the following deficits and  impairments:     Visit  Diagnosis: Muscle weakness (generalized)  Other abnormalities of gait and mobility  Unsteadiness on feet  Hemiplegia and hemiparesis following cerebral infarction affecting right dominant side [facility])     Problem List Patient Active Problem List   Diagnosis Date Noted   Dysarthria, post-stroke 11/03/2020   Benign essential HTN 06/09/2020   Abnormality of gait 04/28/2020   Neurogenic bladder 03/17/2020   Sepsis due to pneumonia ([place]) 03/06/2020   History of stroke with residual deficit 03/06/2020   Transient hypotension 03/06/2020   Spastic hemiplegia affecting nondominant side ([place])    History of hypertension    Expressive aphasia    AKI (acute kidney injury) ([place])    Global aphasia    Hypoalbuminemia due to protein-calorie malnutrition ([place])    Dysphagia, post-stroke    Hyperlipidemia 02/02/2020   Dysphagia following cerebral infarction 02/02/2020   Aspiration pneumonia ([place]) 02/02/2020   Acute ischemic left MCA stroke ([place]) 02/02/2020   Acute respiratory failure ([place])    Acute ischemic stroke ([place]) s/p clot retrieval L MCA & ACA A3 01/27/2020   Aspiration into airway 01/27/2020   Chronic anticoagulation 01/27/2020   Paroxysmal atrial fibrillation ([place]) 06/15/2018   Essential hypertension 06/15/2018   NICM (nonischemic cardiomyopathy) ([place]) 06/15/2018   Visit for monitoring Tikosyn therapy 06/12/2018    Arliss Journey, PT, DPT  02/03/2021, 2:50 PM  [facility] [address] [place], [place], [address] Phone: [phone]   Fax:  [phone]  Name: SAMIR ISHAQ MRN: [id number] Date of Birth: 12-Jun-1956

## 2021-02-06 ENCOUNTER — Ambulatory Visit: Payer: BC Managed Care – PPO | Admitting: Physical Therapy

## 2021-02-07 ENCOUNTER — Ambulatory Visit: Payer: BC Managed Care – PPO | Admitting: Physical Therapy

## 2021-02-07 ENCOUNTER — Other Ambulatory Visit: Payer: Self-pay

## 2021-02-07 DIAGNOSIS — R2689 Other abnormalities of gait and mobility: Secondary | ICD-10-CM

## 2021-02-07 DIAGNOSIS — M6281 Muscle weakness (generalized): Secondary | ICD-10-CM

## 2021-02-07 DIAGNOSIS — I69351 Hemiplegia and hemiparesis following cerebral infarction affecting right dominant side: Secondary | ICD-10-CM

## 2021-02-07 DIAGNOSIS — R2681 Unsteadiness on feet: Secondary | ICD-10-CM

## 2021-02-07 NOTE — Therapy (Signed)
[facility] [address] [place], [place], [address] Phone: [phone]   Fax:  [phone]  Physical Therapy Treatment  Patient Details  Name: Dustin Schneider MRN: [id number] Date of Birth: Dec 16, 1955 Referring Provider (PT): Lauraine Rinne, PA-C but followed by Delice Lesch   Encounter Date: 02/07/2021   PT End of Session - 02/07/21 1537     Visit Number 108    Number of Visits 31    Authorization Type BCBS    PT Start Time 2423    PT Stop Time 1230    PT Time Calculation (min) 45 min    Equipment Utilized During Treatment Gait belt;Other (comment)   buoyancy cuffs   Activity Tolerance Patient tolerated treatment well    Behavior During Therapy [facility] for tasks assessed/performed             Past Medical History:  Diagnosis Date   Hypertension    NICM (nonischemic cardiomyopathy) ([place]) 06/15/2018   04/23/18: EF 45%, 09/15/18: NOrmal LVEF   Paroxysmal atrial fibrillation ([place]) 06/15/2018   Stroke ([place])    Visit for monitoring Tikosyn therapy 06/12/2018    Past Surgical History:  Procedure Laterality Date   BUBBLE STUDY  03/11/2020   Procedure: BUBBLE STUDY;  Surgeon: [name], MD;  Location: [place];  Service: Cardiovascular;;   CARDIOVERSION N/A 05/13/2018   Procedure: CARDIOVERSION;  Surgeon: [name], MD;  Location: [place];  Service: Cardiovascular;  Laterality: N/A;   CARDIOVERSION N/A 06/13/2018   Procedure: CARDIOVERSION;  Surgeon: [name], MD;  Location: [facility] ENDOSCOPY;  Service: Cardiovascular;  Laterality: N/A;   CARDIOVERSION N/A 06/23/2018   Procedure: CARDIOVERSION;  Surgeon: [name], MD;  Location: [place];  Service: Cardiovascular;  Laterality: N/A;   CARDIOVERSION N/A 03/11/2020   Procedure: CARDIOVERSION;  Surgeon: [name], MD;  Location: [place];  Service: Cardiovascular;  Laterality: N/A;   excision of actinic keratosis     EYE SURGERY     eye lift   IR CT  HEAD LTD  01/27/2020   IR PERCUTANEOUS ART THROMBECTOMY/INFUSION INTRACRANIAL INC DIAG ANGIO  01/27/2020       IR PERCUTANEOUS ART THROMBECTOMY/INFUSION INTRACRANIAL INC DIAG ANGIO  01/27/2020   RADIOLOGY WITH ANESTHESIA N/A 01/27/2020   Procedure: IR WITH ANESTHESIA;  Surgeon: Radiologist, Medication, MD;  Location: [place];  Service: Radiology;  Laterality: N/A;   TEE WITHOUT CARDIOVERSION N/A 03/11/2020   Procedure: TRANSESOPHAGEAL ECHOCARDIOGRAM (TEE);  Surgeon: [name], MD;  Location: [place];  Service: Cardiovascular;  Laterality: N/A;   TONSILLECTOMY     WISDOM TOOTH EXTRACTION      There were no vitals filed for this visit.   Subjective Assessment - 02/07/21 1536     Subjective Denies any falls or changes.    Patient is accompained by: --   [name]   Pertinent History PMH: HTN, paroxysmal a fib    Limitations Standing;Walking    Patient Stated Goals "wants everything working on R side again"    Currently in Pain? No/denies            Aquatic [facility] at [facility]   Patient seen for aquatic therapy today.  Treatment took place in water 3.6-4.6 feet deep depending upon activity.  Pt entered the pool via step negotiation with min guard assist with use of hand rail LUE.  Pt wearing tennis shoes and his R AFO.   Gait training with pt holding pool edge vs no UE assist with  PTA providing min-min guard assist.  Gait training forward, backwards and side stepping.    Pt seated on pool bench for R LAQ and hip flexion x 15 reps x 2 sets AAROM.  Pt with difficulty maintaining seated position on bench due to buoyancy.  Sit<>stand from bench x 15 reps with min assist and cues to keep R foot positioned.  Placed buoyancy cuff on RLE and performed 15 reps of LAQ and hip flexion.   Pt in supine position with floatation belt, neck noode and pool noodle.  Performed relaxation floating and PROM hip/knee flexion and hip abd on R.  Pt performed bicycling LE's x 20' x 4.  Hip  abd/add x 20 reps x 3 sets.     Pt requires buoyancy of water for support for safety with gait training and with standing balance; viscosity of water needed for resistance for strengthening    Pt with improved session today with pt wearing R AFO.       PT Short Term Goals - 01/20/21 1414       PT SHORT TERM GOAL #1   Title Pt will decrease TUG with cane from 17.4 sec to < 16 sec for improved balance and functional mobility. ALL STGS DUE 01/22/21    Baseline 17.4 seconds; 17.2 seconds on 01/20/21    Time 4    Period Weeks    Status Not Met    Target Date 01/22/21      PT SHORT TERM GOAL #2   Title Pt  will undergo further assessment of 6MWT with LTG to be written as appropriate.    Baseline 12/26/20 6 MWT completed    Time 4    Period Weeks    Status Achieved      PT SHORT TERM GOAL #3   Title Pt will increase gait speed from 0.53 m/s with cane to >0.17ms for improved mobility.    Baseline 18.8 seconds = .53 m/s; 20.43 seconds = .50 m/s on 01/20/21    Time 4    Period Weeks    Status Not Met      PT SHORT TERM GOAL #4   Title Pt will ambulate at least 50' over outdoor grass surfaces with cane and supervision in order to demo improved safety ambulating in his yard.    Baseline met on 01/22/21    Time 4    Period Weeks    Status Achieved               PT Long Term Goals - 12/26/20 2010       PT LONG TERM GOAL #1   Title Pt will ambulate at least 200' over grass, gravel, and mulch surfaces with cane and supervision in order to demo improved community mobility on unlevel surfaces. ALL LTGS DUE 02/19/21    Time 8    Period Weeks    Status New      PT LONG TERM GOAL #2   Title Pt will increase 6 min walk from 540' to >650' to demo improved gait endurance.    Baseline 12/26/20 540'    Time 8    Period Weeks    Status New      PT LONG TERM GOAL #3   Title Pt will perform fall recovery with supervision (and possible set up assist for RLE to come to stand) in order to  demo improved safety with transfers if pt has a fall.    Time 8    Period  Weeks    Status New      PT LONG TERM GOAL #4   Title Pt will ambulate up/down 4 steps with LRAD and mod I for improved access in home to living room    Baseline with cane and supervision    Time 8    Period Weeks    Status New      PT LONG TERM GOAL #5   Title Pt will improve condition 4 of mCTSIB to at least 10 seconds in order to demo improved vestibular input for balance.    Baseline 4 seconds    Time 8    Period Weeks    Status New                   Plan - 02/07/21 1537     Clinical Impression Statement Aquatic session performed today with pt's R AFO with improved gait and balance in pool.  Pt has been instructed to let AFO straps dry out once he gets home from aquatic sessions.  Pt with decreased active hamstring on R.  Cont per poc.    Personal Factors and Comorbidities Comorbidity 3+    Comorbidities dense L MCA CVA, paroxysmal A. fib on chronic Xarelto, nonischemic cardiomyopathy and hypertension.    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 Continue with BWS for incr hip flexion during gait, R  stance time and slowing down. balance strategies on compliant surfacces. activites to for incr stance time on RLE and getting more hip extension. will need to check goals soon.    PT Home Exercise Plan on 06/28/20 - added standing at countetop and stepping LLE out and in    Consulted and Agree with Plan of Care Patient             Patient will benefit from skilled therapeutic intervention in order to improve the following deficits and impairments:  Abnormal gait, Decreased activity tolerance, Decreased balance, Decreased cognition, Decreased mobility, Decreased coordination, Decreased range of motion, Decreased endurance, Decreased strength, Hypomobility, Difficulty walking, Impaired UE functional use, Impaired vision/preception, Postural dysfunction, Impaired sensation  Visit Diagnosis: Muscle weakness (generalized)  Other abnormalities of gait and mobility  Unsteadiness on feet  Hemiplegia and hemiparesis following cerebral infarction affecting right dominant side [facility])     Problem List Patient Active Problem List   Diagnosis Date Noted   Dysarthria, post-stroke 11/03/2020   Benign essential HTN 06/09/2020   Abnormality of gait 04/28/2020   Neurogenic bladder 03/17/2020   Sepsis due to pneumonia ([place]) 03/06/2020   History of stroke with residual deficit 03/06/2020   Transient hypotension 03/06/2020   Spastic hemiplegia affecting nondominant side ([facility])    History of hypertension    Expressive aphasia    AKI (acute kidney injury) ([place])    Global aphasia    Hypoalbuminemia due to protein-calorie malnutrition ([place])    Dysphagia, post-stroke    Hyperlipidemia 02/02/2020   Dysphagia following cerebral infarction 02/02/2020   Aspiration pneumonia ([place]) 02/02/2020  Acute ischemic left MCA stroke ([place]) 02/02/2020   Acute respiratory failure ([place])    Acute ischemic stroke ([place]) s/p clot retrieval L MCA & ACA A3 01/27/2020   Aspiration into airway 01/27/2020   Chronic  anticoagulation 01/27/2020   Paroxysmal atrial fibrillation ([place]) 06/15/2018   Essential hypertension 06/15/2018   NICM (nonischemic cardiomyopathy) ([place]) 06/15/2018   Visit for monitoring Tikosyn therapy 06/12/2018   Narda Bonds, PTA [place] 02/07/21 3:45 PM Phone: [phone] Fax: [facility] [address] [place], [place], [address] Phone: [phone]   Fax:  [phone]  Name: Dustin Schneider MRN: [id number] Date of Birth: [birth year]

## 2021-02-09 ENCOUNTER — Ambulatory Visit: Payer: BC Managed Care – PPO

## 2021-02-10 ENCOUNTER — Other Ambulatory Visit: Payer: Self-pay

## 2021-02-10 ENCOUNTER — Ambulatory Visit: Payer: BC Managed Care – PPO | Attending: Physician Assistant | Admitting: Physical Therapy

## 2021-02-10 DIAGNOSIS — R29818 Other symptoms and signs involving the nervous system: Secondary | ICD-10-CM | POA: Diagnosis present

## 2021-02-10 DIAGNOSIS — I69351 Hemiplegia and hemiparesis following cerebral infarction affecting right dominant side: Secondary | ICD-10-CM

## 2021-02-10 DIAGNOSIS — R2681 Unsteadiness on feet: Secondary | ICD-10-CM

## 2021-02-10 DIAGNOSIS — R2689 Other abnormalities of gait and mobility: Secondary | ICD-10-CM | POA: Diagnosis present

## 2021-02-10 DIAGNOSIS — M6281 Muscle weakness (generalized): Secondary | ICD-10-CM | POA: Diagnosis present

## 2021-02-10 NOTE — Therapy (Signed)
[facility] [address] [place], [place], [address] Phone: [phone]   Fax:  [phone]  Physical Therapy Treatment  Patient Details  Name: Dustin Schneider MRN: [id number] Date of Birth: 05-03-1956 Referring Provider (PT): Lauraine Rinne, PA-C but followed by Delice Lesch   Encounter Date: 02/10/2021   PT End of Session - 02/10/21 1647     Visit Number 51    Number of Visits 63    Authorization Type BCBS    PT Start Time 1655    PT Stop Time 3748    PT Time Calculation (min) 41 min    Equipment Utilized During Treatment Gait belt    Activity Tolerance Patient tolerated treatment well    Behavior During Therapy [facility] for tasks assessed/performed             Past Medical History:  Diagnosis Date   Hypertension    NICM (nonischemic cardiomyopathy) ([place]) 06/15/2018   04/23/18: EF 45%, 09/15/18: NOrmal LVEF   Paroxysmal atrial fibrillation ([place]) 06/15/2018   Stroke ([place])    Visit for monitoring Tikosyn therapy 06/12/2018    Past Surgical History:  Procedure Laterality Date   BUBBLE STUDY  03/11/2020   Procedure: BUBBLE STUDY;  Surgeon: [name], MD;  Location: [place];  Service: Cardiovascular;;   CARDIOVERSION N/A 05/13/2018   Procedure: CARDIOVERSION;  Surgeon: [name], MD;  Location: [place];  Service: Cardiovascular;  Laterality: N/A;   CARDIOVERSION N/A 06/13/2018   Procedure: CARDIOVERSION;  Surgeon: [name], MD;  Location: [place];  Service: Cardiovascular;  Laterality: N/A;   CARDIOVERSION N/A 06/23/2018   Procedure: CARDIOVERSION;  Surgeon: [name], MD;  Location: [place];  Service: Cardiovascular;  Laterality: N/A;   CARDIOVERSION N/A 03/11/2020   Procedure: CARDIOVERSION;  Surgeon: [name], MD;  Location: [place];  Service: Cardiovascular;  Laterality: N/A;   excision of actinic keratosis     EYE SURGERY     eye lift   IR CT HEAD LTD  01/27/2020   IR  PERCUTANEOUS ART THROMBECTOMY/INFUSION INTRACRANIAL INC DIAG ANGIO  01/27/2020       IR PERCUTANEOUS ART THROMBECTOMY/INFUSION INTRACRANIAL INC DIAG ANGIO  01/27/2020   RADIOLOGY WITH ANESTHESIA N/A 01/27/2020   Procedure: IR WITH ANESTHESIA;  Surgeon: Radiologist, Medication, MD;  Location: [place];  Service: Radiology;  Laterality: N/A;   TEE WITHOUT CARDIOVERSION N/A 03/11/2020   Procedure: TRANSESOPHAGEAL ECHOCARDIOGRAM (TEE);  Surgeon: [name], MD;  Location: [facility];  Service: Cardiovascular;  Laterality: N/A;   TONSILLECTOMY     WISDOM TOOTH EXTRACTION      There were no vitals filed for this visit.   Subjective Assessment - 02/10/21 1408     Subjective Pool wore him out. Went really well with the brace.    Patient is accompained by: --   [name]   Pertinent History PMH: HTN, paroxysmal a fib    Limitations Standing;Walking    Patient Stated Goals "wants everything working on R side again"    Currently in Pain? No/denies                [facility] PT Assessment - 02/10/21 1422       6 Minute Walk- Baseline   6 Minute Walk- Baseline yes      6 Minute walk- Post Test   6 Minute Walk Post Test yes    Modified Borg Scale for Dyspnea 0- Nothing at all      6 minute walk test  results    Aerobic Endurance Distance Walked 689                           [facility] Adult PT Treatment/Exercise - 02/10/21 1431       Ambulation/Gait   Ambulation/Gait Yes    Ambulation/Gait Assistance 5: Supervision    Ambulation/Gait Assistance Details during 6MWT, pt able to consistently take bigger steps with LLE    Ambulation Distance (Feet) 689 Feet   during 6MWT   Assistive device Straight cane   with 6 prong tip   Gait Pattern Step-through pattern;Decreased stance time - right;Decreased hip/knee flexion - right;Abducted- right    Ambulation Surface Level;Indoor      Therapeutic Activites    Therapeutic Activities Other Therapeutic Activities    Other Therapeutic  Activities discussed POC going forwards due to pt's progress with therapy, pt wants to get a home program to continue working with the pool (just started a couple weeks ago and last visit was when he could weight bear properly due to using R AFO) - plan to go for another 4-6 weeks to include aquatics. for goals for land therapy, pt reports he wants to continue to work on his gait endurance and walking, will continue to work on this as well as begin to Adult nurse and community program with plan to D/C in 4-6 weeks to take a break from therapy and work on his own at home, pt in agreement with plan. Discussed possible use of the [facility] or elsewhere with a walking track that pt can ambulate indoors with either caregiver, [name] or friend.      Neuro Re-ed    Neuro Re-ed Details  with red mat on floor, pt able to get down to floor with supervision using mat table as support, to stand back up pt need initial set up assist with RLE for proper positioning, but otherwise supervision with RLE forwards .NMR: in tall kneeling on floor on red mat: 2 x 10 reps L hip ABD for closed chain R hip ABD - cues for proper technique and posture, on blue air ex: tall kneel with cues for posture and core activation, 2 x 10 reps head turns with intermittent taps to mat for balance (pt needing a rest break between each set)     Exercises   Exercises Other Exercises    Other Exercises  sidelying R hip ABDuction x10 reps - with therapist providing AAROM to help keep leg in proper positioning, bridging x10 reps with BLE on blue balance discs                    PT Education - 02/10/21 1647     Education Details see TA, progress towards goals, scheduling for aquatics next week.    Person(s) Educated Patient    Methods Explanation    Comprehension Verbalized understanding              PT Short Term Goals - 01/20/21 1414       PT SHORT TERM GOAL #1   Title Pt will decrease TUG with cane from 17.4 sec to < 16 sec  for improved balance and functional mobility. ALL STGS DUE 01/22/21    Baseline 17.4 seconds; 17.2 seconds on 01/20/21    Time 4    Period Weeks    Status Not Met    Target Date 01/22/21      PT SHORT TERM GOAL #2  Title Pt  will undergo further assessment of 6MWT with LTG to be written as appropriate.    Baseline 12/26/20 6 MWT completed    Time 4    Period Weeks    Status Achieved      PT SHORT TERM GOAL #3   Title Pt will increase gait speed from 0.53 m/s with cane to >0.38ms for improved mobility.    Baseline 18.8 seconds = .53 m/s; 20.43 seconds = .50 m/s on 01/20/21    Time 4    Period Weeks    Status Not Met      PT SHORT TERM GOAL #4   Title Pt will ambulate at least 50' over outdoor grass surfaces with cane and supervision in order to demo improved safety ambulating in his yard.    Baseline met on 01/22/21    Time 4    Period Weeks    Status Achieved               PT Long Term Goals - 02/10/21 1421       PT LONG TERM GOAL #1   Title Pt will ambulate at least 200' over grass, gravel, and mulch surfaces with cane and supervision in order to demo improved community mobility on unlevel surfaces. ALL LTGS DUE 02/19/21    Time 8    Period Weeks    Status New      PT LONG TERM GOAL #2   Title Pt will increase 6 min walk from 540' to >650' to demo improved gait endurance.    Baseline 12/26/20 540'; 689' on 02/10/21    Time 8    Period Weeks    Status Achieved      PT LONG TERM GOAL #3   Title Pt will perform fall recovery with supervision (and possible set up assist for RLE to come to stand) in order to demo improved safety with transfers if pt has a fall.    Baseline met on 02/10/21.    Time 8    Period Weeks    Status New      PT LONG TERM GOAL #4   Title Pt will ambulate up/down 4 steps with LRAD and mod I for improved access in home to living room    Baseline with cane and supervision    Time 8    Period Weeks    Status New      PT LONG TERM GOAL #5    Title Pt will improve condition 4 of mCTSIB to at least 10 seconds in order to demo improved vestibular input for balance.    Baseline 4 seconds    Time 8    Period Weeks    Status New                 Plan - 02/10/21 1652     Clinical Impression Statement Began to assess pt's LTGs with pt meeting LTG #2 and #3. Pt significantly incr his walking distance during 6MWT to 689' (previously was 540'), with pt able to consistently take longer steps with LLE and pt able to perform with supervision. Discussed POC going forwards as it ends next week, due to progress and pt just beginning aquatic therapy recently with goal of having a home program to continue, will continue for 2x week for 4-6 weeks to finalize HEP/community program and then plan to D/C. Pt in agreement with plan.    Personal Factors and Comorbidities Comorbidity 3+    Comorbidities dense  L MCA CVA, paroxysmal A. fib on chronic Xarelto, nonischemic cardiomyopathy and hypertension.    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 tall kneeling/half kneeling activities to pt's tolerance. Continue with BWS for incr hip flexion during gait, R stance time and slowing  down. balance strategies on compliant surfacces. activites to for incr stance time on RLE and getting more hip extension.    PT Home Exercise Plan on 06/28/20 - added standing at countetop and stepping LLE out and in    Consulted and Agree with Plan of Care Patient             Patient will benefit from skilled therapeutic intervention in order to improve the following deficits and impairments:  Abnormal gait, Decreased activity tolerance, Decreased balance, Decreased cognition, Decreased mobility, Decreased coordination, Decreased range of motion, Decreased endurance, Decreased strength, Hypomobility, Difficulty walking, Impaired UE functional use, Impaired vision/preception, Postural dysfunction, Impaired sensation  Visit Diagnosis: Muscle weakness (generalized)  Other abnormalities of gait and mobility  Unsteadiness on feet  Hemiplegia and hemiparesis following cerebral infarction affecting right dominant side [facility])     Problem List Patient Active Problem List   Diagnosis Date Noted   Dysarthria, post-stroke 11/03/2020   Benign essential HTN 06/09/2020   Abnormality of gait 04/28/2020   Neurogenic bladder 03/17/2020   Sepsis due to pneumonia ([place]) 03/06/2020   History of stroke with residual deficit 03/06/2020   Transient hypotension 03/06/2020   Spastic hemiplegia affecting nondominant side ([place])    History of hypertension    Expressive aphasia    AKI (acute kidney injury) ([place])    Global aphasia    Hypoalbuminemia due to protein-calorie malnutrition ([place])    Dysphagia, post-stroke    Hyperlipidemia 02/02/2020   Dysphagia following cerebral infarction 02/02/2020   Aspiration pneumonia ([place]) 02/02/2020   Acute ischemic left MCA stroke ([place]) 02/02/2020   Acute respiratory failure ([facility])    Acute ischemic stroke ([place]) s/p clot retrieval L MCA & ACA A3 01/27/2020   Aspiration into airway 01/27/2020   Chronic anticoagulation 01/27/2020   Paroxysmal atrial fibrillation  ([place]) 06/15/2018   Essential hypertension 06/15/2018   NICM (nonischemic cardiomyopathy) ([place]) 06/15/2018   Visit for monitoring Tikosyn therapy 06/12/2018    Arliss Journey, PT, DPT  02/10/2021, 4:53 PM  [facility] [address] [place], [place], [address] Phone: [phone]   Fax:  [phone]  Name: Dustin Schneider MRN: [id number] Date of Birth: May 14, 1956

## 2021-02-10 NOTE — Progress Notes (Signed)
I agree with the above plan 

## 2021-02-14 ENCOUNTER — Other Ambulatory Visit: Payer: Self-pay

## 2021-02-14 ENCOUNTER — Encounter: Payer: Self-pay | Admitting: Physical Therapy

## 2021-02-14 ENCOUNTER — Ambulatory Visit: Payer: BC Managed Care – PPO | Admitting: Physical Therapy

## 2021-02-14 DIAGNOSIS — M6281 Muscle weakness (generalized): Secondary | ICD-10-CM

## 2021-02-14 DIAGNOSIS — R2681 Unsteadiness on feet: Secondary | ICD-10-CM

## 2021-02-14 DIAGNOSIS — R2689 Other abnormalities of gait and mobility: Secondary | ICD-10-CM

## 2021-02-14 NOTE — Therapy (Signed)
[facility] [address] [place], [place], [address] Phone: [phone]   Fax:  [phone]  Physical Therapy Treatment  Patient Details  Name: Dustin Schneider MRN: [id number] Date of Birth: June 19, 1956 Referring Provider (PT): Lauraine Rinne, PA-C but followed by Delice Lesch   Encounter Date: 02/14/2021   PT End of Session - 02/14/21 1438     Visit Number 91    Number of Visits 75    Authorization Type BCBS    PT Start Time 0973    PT Stop Time 5329    PT Time Calculation (min) 45 min    Equipment Utilized During Treatment Gait belt    Activity Tolerance Patient tolerated treatment well    Behavior During Therapy [facility] for tasks assessed/performed             Past Medical History:  Diagnosis Date   Hypertension    NICM (nonischemic cardiomyopathy) ([place]) 06/15/2018   04/23/18: EF 45%, 09/15/18: NOrmal LVEF   Paroxysmal atrial fibrillation ([place]) 06/15/2018   Stroke ([place])    Visit for monitoring Tikosyn therapy 06/12/2018    Past Surgical History:  Procedure Laterality Date   BUBBLE STUDY  03/11/2020   Procedure: BUBBLE STUDY;  Surgeon: [name], MD;  Location: [place];  Service: Cardiovascular;;   CARDIOVERSION N/A 05/13/2018   Procedure: CARDIOVERSION;  Surgeon: [name], MD;  Location: [place];  Service: Cardiovascular;  Laterality: N/A;   CARDIOVERSION N/A 06/13/2018   Procedure: CARDIOVERSION;  Surgeon: [name], MD;  Location: [place];  Service: Cardiovascular;  Laterality: N/A;   CARDIOVERSION N/A 06/23/2018   Procedure: CARDIOVERSION;  Surgeon: [name], MD;  Location: [place];  Service: Cardiovascular;  Laterality: N/A;   CARDIOVERSION N/A 03/11/2020   Procedure: CARDIOVERSION;  Surgeon: [name], MD;  Location: [place];  Service: Cardiovascular;  Laterality: N/A;   excision of actinic keratosis     EYE SURGERY     eye lift   IR CT HEAD LTD  01/27/2020   IR  PERCUTANEOUS ART THROMBECTOMY/INFUSION INTRACRANIAL INC DIAG ANGIO  01/27/2020       IR PERCUTANEOUS ART THROMBECTOMY/INFUSION INTRACRANIAL INC DIAG ANGIO  01/27/2020   RADIOLOGY WITH ANESTHESIA N/A 01/27/2020   Procedure: IR WITH ANESTHESIA;  Surgeon: Radiologist, Medication, MD;  Location: [place] [place];  Service: Radiology;  Laterality: N/A;   TEE WITHOUT CARDIOVERSION N/A 03/11/2020   Procedure: TRANSESOPHAGEAL ECHOCARDIOGRAM (TEE);  Surgeon: [name], MD;  Location: [place];  Service: Cardiovascular;  Laterality: N/A;   TONSILLECTOMY     WISDOM TOOTH EXTRACTION      There were no vitals filed for this visit.   Subjective Assessment - 02/14/21 1437     Subjective Denies any falls or changes.    Patient is accompained by: Family member   [place]   Pertinent History PMH: HTN, paroxysmal a fib    Limitations Standing;Walking    Patient Stated Goals "wants everything working on R side again"    Currently in Pain? No/denies            Aquatic therapy at [facility]   Patient seen for aquatic therapy today.  Treatment took place in water 3.6-4.6 feet deep depending upon activity.  Pt entered the pool via step negotiation with min guard assist with use of hand rail LUE.  Pt wearing tennis shoes and his R AFO.   Gait training with pt holding pool edge vs no UE assist with PTA providing guard  assist with gait belt.  Gait training forward, backwards and side stepping. Cues to increase extension on RLE and proper foot placement with side stepping.  Pt performed side stepping with squat as well.  In 4.5 ft of water for half jumping jacks.  Pt initially with difficulty coordinating bil LE's but able to perform x 20 reps x 2 sets with cues and attempts.  Pt using pool wall for UE support.   Standing at pool edge for bil LE squats x 15 reps x 2 sets.    Pt in supine position with floatation belt, neck noode and pool noodle.  Performed relaxation floating and PROM hip/knee  flexion and hip abd on R.  Pt performed bicycling LE's x 20' x 4.  Hip abd/add x 20 reps x 3 sets.     Pt requires buoyancy of water for support for safety with gait training and with standing balance; viscosity of water needed for resistance for strengthening    Pt with improved session today with pt wearing R AFO.       PT Short Term Goals - 01/20/21 1414       PT SHORT TERM GOAL #1   Title Pt will decrease TUG with cane from 17.4 sec to < 16 sec for improved balance and functional mobility. ALL STGS DUE 01/22/21    Baseline 17.4 seconds; 17.2 seconds on 01/20/21    Time 4    Period Weeks    Status Not Met    Target Date 01/22/21      PT SHORT TERM GOAL #2   Title Pt  will undergo further assessment of 6MWT with LTG to be written as appropriate.    Baseline 12/26/20 6 MWT completed    Time 4    Period Weeks    Status Achieved      PT SHORT TERM GOAL #3   Title Pt will increase gait speed from 0.53 m/s with cane to >0.80ms for improved mobility.    Baseline 18.8 seconds = .53 m/s; 20.43 seconds = .50 m/s on 01/20/21    Time 4    Period Weeks    Status Not Met      PT SHORT TERM GOAL #4   Title Pt will ambulate at least 50' over outdoor grass surfaces with cane and supervision in order to demo improved safety ambulating in his yard.    Baseline met on 01/22/21    Time 4    Period Weeks    Status Achieved               PT Long Term Goals - 02/10/21 1421       PT LONG TERM GOAL #1   Title Pt will ambulate at least 200' over grass, gravel, and mulch surfaces with cane and supervision in order to demo improved community mobility on unlevel surfaces. ALL LTGS DUE 02/19/21    Time 8    Period Weeks    Status New      PT LONG TERM GOAL #2   Title Pt will increase 6 min walk from 540' to >650' to demo improved gait endurance.    Baseline 12/26/20 540'; 689' on 02/10/21    Time 8    Period Weeks    Status Achieved      PT LONG TERM GOAL #3   Title Pt will perform fall  recovery with supervision (and possible set up assist for RLE to come to stand) in order to demo improved safety  with transfers if pt has a fall.    Baseline met on 02/10/21.    Time 8    Period Weeks    Status New      PT LONG TERM GOAL #4   Title Pt will ambulate up/down 4 steps with LRAD and mod I for improved access in home to living room    Baseline with cane and supervision    Time 8    Period Weeks    Status New      PT LONG TERM GOAL #5   Title Pt will improve condition 4 of mCTSIB to at least 10 seconds in order to demo improved vestibular input for balance.    Baseline 4 seconds    Time 8    Period Weeks    Status New                   Plan - 02/14/21 1439     Clinical Impression Statement Pt with improved balance and gait in pool today and able to perform higher level activites than previous sessions.  Will discuss with primary PT how many additional aquatic visits pt may require and continue as appropriate.    Personal Factors and Comorbidities Comorbidity 3+    Comorbidities dense L MCA CVA, paroxysmal A. fib on chronic Xarelto, nonischemic cardiomyopathy and hypertension.    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 tall kneeling/half kneeling activities to pt's tolerance. Continue with BWS for incr hip flexion during gait, R stance time and slowing down. balance strategies on compliant surfacces. activites to for incr stance time on RLE and getting more hip extension.    PT Home Exercise Plan on 06/28/20 - added standing at countetop and stepping LLE out and in    Consulted and Agree with Plan of Care Patient             Patient will benefit from skilled therapeutic intervention in order to improve the following deficits and impairments:  Abnormal gait, Decreased activity tolerance, Decreased balance, Decreased cognition, Decreased mobility, Decreased coordination, Decreased range of motion, Decreased endurance, Decreased strength, Hypomobility, Difficulty walking, Impaired UE functional use, Impaired vision/preception, Postural dysfunction, Impaired sensation  Visit Diagnosis: Muscle weakness (generalized)  Other abnormalities of gait and mobility  Unsteadiness on feet     Problem List Patient Active Problem List   Diagnosis Date Noted   Dysarthria, post-stroke 11/03/2020   Benign essential HTN 06/09/2020   Abnormality of gait 04/28/2020   Neurogenic bladder 03/17/2020   Sepsis due to pneumonia ([place]) 03/06/2020   History of stroke with residual deficit 03/06/2020   Transient hypotension 03/06/2020   Spastic hemiplegia affecting nondominant side ([facility])    History of hypertension    Expressive aphasia    AKI (acute kidney injury) ([place])    Global aphasia    Hypoalbuminemia due to protein-calorie malnutrition ([place])    Dysphagia, post-stroke    Hyperlipidemia 02/02/2020   Dysphagia following cerebral infarction 02/02/2020   Aspiration pneumonia (  [place]) 02/02/2020   Acute ischemic left MCA stroke ([place]) 02/02/2020   Acute respiratory failure ([place])    Acute ischemic stroke ([place]) s/p clot retrieval L MCA & ACA A3 01/27/2020   Aspiration into airway 01/27/2020    Chronic anticoagulation 01/27/2020   Paroxysmal atrial fibrillation ([place]) 06/15/2018   Essential hypertension 06/15/2018   NICM (nonischemic cardiomyopathy) ([place]) 06/15/2018   Visit for monitoring Tikosyn therapy 06/12/2018   Narda Bonds, PTA [facility] 02/14/21 2:45 PM Phone: [phone] Fax: [facility] [address] [place], [place], [address] Phone: [phone]   Fax:  [phone]  Name: SAAD BUHL MRN: [id number] Date of Birth: 14-Jun-1956

## 2021-02-15 ENCOUNTER — Ambulatory Visit: Payer: BC Managed Care – PPO | Admitting: Physical Therapy

## 2021-02-15 ENCOUNTER — Encounter: Payer: Self-pay | Admitting: Physical Therapy

## 2021-02-15 DIAGNOSIS — M6281 Muscle weakness (generalized): Secondary | ICD-10-CM

## 2021-02-15 DIAGNOSIS — R2681 Unsteadiness on feet: Secondary | ICD-10-CM

## 2021-02-15 DIAGNOSIS — R2689 Other abnormalities of gait and mobility: Secondary | ICD-10-CM

## 2021-02-15 DIAGNOSIS — R29818 Other symptoms and signs involving the nervous system: Secondary | ICD-10-CM

## 2021-02-15 NOTE — Therapy (Addendum)
[facility] [address] [place], [place], [address] Phone: [phone]   Fax:  [phone]  Physical Therapy Treatment/Re-Cert  Patient Details  Name: Dustin Schneider MRN: [id number] Date of Birth: [birth year] Referring Provider (PT): Lauraine Rinne, PA-C but followed by Delice Lesch   Encounter Date: 02/15/2021   PT End of Session - 02/15/21 1534     Visit Number [id number]    Number of Visits 104    Date for PT Re-Evaluation 04/16/21    Authorization Type BCBS    PT Start Time 1446    PT Stop Time 1528    PT Time Calculation (min) 42 min    Equipment Utilized During Treatment Gait belt    Activity Tolerance Patient tolerated treatment well    Behavior During Therapy [facility] for tasks assessed/performed             Past Medical History:  Diagnosis Date   Hypertension    NICM (nonischemic cardiomyopathy) ([place]) 06/15/2018   04/23/18: EF 45%, 09/15/18: NOrmal LVEF   Paroxysmal atrial fibrillation ([place]) 06/15/2018   Stroke ([place])    Visit for monitoring Tikosyn therapy 06/12/2018    Past Surgical History:  Procedure Laterality Date   BUBBLE STUDY  03/11/2020   Procedure: BUBBLE STUDY;  Surgeon: [name], MD;  Location: [place];  Service: Cardiovascular;;   CARDIOVERSION N/A 05/13/2018   Procedure: CARDIOVERSION;  Surgeon: [name], MD;  Location: [place];  Service: Cardiovascular;  Laterality: N/A;   CARDIOVERSION N/A 06/13/2018   Procedure: CARDIOVERSION;  Surgeon: [name], MD;  Location: [place];  Service: Cardiovascular;  Laterality: N/A;   CARDIOVERSION N/A 06/23/2018   Procedure: CARDIOVERSION;  Surgeon: [name], MD;  Location: [place];  Service: Cardiovascular;  Laterality: N/A;   CARDIOVERSION N/A 03/11/2020   Procedure: CARDIOVERSION;  Surgeon: [name], MD;  Location: [place];  Service: Cardiovascular;  Laterality: N/A;   excision of actinic keratosis     EYE SURGERY     eye  lift   IR CT HEAD LTD  01/27/2020   IR PERCUTANEOUS ART THROMBECTOMY/INFUSION INTRACRANIAL INC DIAG ANGIO  01/27/2020       IR PERCUTANEOUS ART THROMBECTOMY/INFUSION INTRACRANIAL INC DIAG ANGIO  01/27/2020   RADIOLOGY WITH ANESTHESIA N/A 01/27/2020   Procedure: IR WITH ANESTHESIA;  Surgeon: Radiologist, Medication, MD;  Location: [place];  Service: Radiology;  Laterality: N/A;   TEE WITHOUT CARDIOVERSION N/A 03/11/2020   Procedure: TRANSESOPHAGEAL ECHOCARDIOGRAM (TEE);  Surgeon: [name], MD;  Location: [place];  Service: Cardiovascular;  Laterality: N/A;   TONSILLECTOMY     WISDOM TOOTH EXTRACTION      There were no vitals filed for this visit.   Subjective Assessment - 02/15/21 1450     Subjective No changes, no falls.    Patient is accompained by: Family member   [place]   Pertinent History PMH: HTN, paroxysmal a fib    Limitations Standing;Walking    Patient Stated Goals "wants everything working on R side again"    Currently in Pain? No/denies                [facility] PT Assessment - 02/15/21 1538       Assessment   Medical Diagnosis L MCA CVA    Referring Provider (PT) Lauraine Rinne, PA-C but followed by Delice Lesch    Onset Date/Surgical Date 01/27/20      Prior Function   Level of Independence Independent  [facility] Adult PT Treatment/Exercise - 02/15/21 1538       Ambulation/Gait   Ambulation/Gait Yes    Ambulation/Gait Assistance 5: Supervision    Ambulation/Gait Assistance Details outdoors over unlevel grass surfaces, gravel and mulch with supervision, pt able to incr step length with LLE over unlevel surfaces    Ambulation Distance (Feet) 300 Feet    Assistive device Straight cane   with 6 prong tip   Gait Pattern Step-through pattern;Decreased stance time - right;Decreased hip/knee flexion - right;Abducted- right    Ambulation Surface Level;Indoor;Outdoor;Paved;Grass    Stairs Yes    Stairs Assistance 5:  Supervision    Stairs Assistance Details (indicate cue type and reason) pt needing to intermittently bump into handrails for balance when descending with cane but still with supervision, discussed making sure pt still has someone with him for supervision when going up/down stairs    Stair Management Technique Step to pattern;Forwards;With cane    Number of Stairs 4    Height of Stairs 6      Neuro Re-ed    Neuro Re-ed Details  standing on blue air ex with feet apart, grabbing with LUE a bean bag towards R side from therapist for incr weight shift to RLE (performed in multi directions) - pt then throwing with his L hand or trying some with his R hand (was able to release at times). performed an additional 5 reps with therapist performing hand over hand technique with RUE to grab bean bag with R hand and weight shifting forwards/to the L, pt reporting R ankle felt a little sore after being on the foam, discussed resting and taking brace off at home and can try putting ice on it to help                 Balance Exercises - 02/15/21 1537       Balance Exercises: Standing   Standing Eyes Closed Foam/compliant surface    Standing Eyes Closed Limitations narrow BOS - eyes closed x10 seconds, 2 x 30 seconds, with feet apart 2 x 10 reps head turns, 2 x 10 reps head nods - incr difficulty with head turns               PT Education - 02/15/21 1533     Education Details progress towards goals, scheduling for more appts, POC going forwards.    Person(s) Educated Patient;Caregiver(s)    Methods Explanation    Comprehension Verbalized understanding             New STG:  PT Short Term Goals - 02/15/21 1553       PT SHORT TERM GOAL #1   Title Pt will ambulate at least 500' over unlevel paved and grass surfaces with supervision in order to demo improved community mobility. ALL STGS DUE 03/08/21    Time 3    Period Weeks    Status New    Target Date 03/08/21                  PT Long Term Goals - 02/15/21 1458       PT LONG TERM GOAL #1   Title Pt will ambulate at least 200' over grass, gravel, and mulch surfaces with cane and supervision in order to demo improved community mobility on unlevel surfaces. ALL LTGS DUE 02/19/21    Baseline met on 02/15/21    Time 8    Period Weeks    Status Achieved  PT LONG TERM GOAL #2   Title Pt will increase 6 min walk from 540' to >650' to demo improved gait endurance.    Baseline 12/26/20 540'; 689' on 02/10/21    Time 8    Period Weeks    Status Achieved      PT LONG TERM GOAL #3   Title Pt will perform fall recovery with supervision (and possible set up assist for RLE to come to stand) in order to demo improved safety with transfers if pt has a fall.    Baseline met on 02/10/21.    Time 8    Period Weeks    Status Achieved      PT LONG TERM GOAL #4   Title Pt will ambulate up/down 4 steps with LRAD and mod I for improved access in home to living room    Baseline with cane and supervision on 02/15/21    Time 8    Period Weeks    Status Not Met      PT LONG TERM GOAL #5   Title Pt will improve condition 4 of mCTSIB to at least 10 seconds in order to demo improved vestibular input for balance.    Baseline 4 seconds - 10 seconds 1st attempt 30 seconds 2nd attempt on 02/15/21    Time 8    Period Weeks    Status Achieved              New LTGs:  PT Long Term Goals - 02/15/21 1602       PT LONG TERM GOAL #1   Title Pt will be independent with final HEP for land and aquatic therapy in order to build upon functional gains made in therapy. ALL LTGS DUE 03/29/21    Time 6    Period Weeks    Status New    Target Date 03/29/21      PT LONG TERM GOAL #2   Title Pt will increase 6 min walk from 689' to > 730' to demo improved gait endurance.    Baseline 12/26/20 540'; 689' on 02/10/21    Time 6    Period Weeks    Status Revised      PT LONG TERM GOAL #3   Title Pt will increase gait speed from 0.50  m/s with cane to >0.57 m/s for improved community mobility.    Baseline .50 m/s on 01/20/21    Time 6    Period Weeks    Status New                  Plan - 02/15/21 1557     Clinical Impression Statement Finished assessing pt's LTGs today. Pt met LTG #1 and #5. Pt able to ambulate outdoors with [facility] with 6 prong tip with supervision over grass/mulch/and gravel surfaces, pt even able to incr step length with LLE. Pt met LTG #5 in regards to condition 4 of mCTSIB, able to hold for 30 seconds today (previously was 4 seconds), indicating improved vestibular input for balance. Will re-cert for an additional 2x week for 4-6 weeks (1x a week in clinic and 1x a week in pool) to finalize HEP/community program and aquatic HEP for anticipated D/C at end of POC due to pt's progress.Will also continue to work on gait, balance, endurance, and strengthening. Pt has made steady progress with PT. STGs and LTGs updated as appropriate.    Personal Factors and Comorbidities Comorbidity 3+    Comorbidities dense L MCA CVA,  paroxysmal A. fib on chronic Xarelto, nonischemic cardiomyopathy and hypertension.    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, works as an Electrical engineer Evolving/Moderate complexity    Rehab Potential Good    PT Frequency 2x / week    PT Duration 6 weeks    PT Treatment/Interventions ADLs/Self Care Home Management;Aquatic Therapy;DME Instruction;Gait training;Stair training;Functional mobility training;Therapeutic activities;Therapeutic exercise;Electrical Stimulation;Balance training;Neuromuscular re-education;Wheelchair mobility training;Orthotic Fit/Training;Patient/family education;Passive range of motion;Energy conservation    PT Next Visit Plan finalizing pt's  HEP/community program with anticipated D/C after this POC. tall kneeling/half kneeling activities to pt's tolerance. working on [employer] gait distances. balance strategies on compliant surfaces. activites to for incr stance time on RLE and getting more hip extension.    PT Home Exercise Plan on 06/28/20 - added standing at countetop and stepping LLE out and in    Consulted and Agree with Plan of Care Patient             Patient will benefit from skilled therapeutic intervention in order to improve the following deficits and impairments:  Abnormal gait, Decreased activity tolerance, Decreased balance, Decreased cognition, Decreased mobility, Decreased coordination, Decreased range of motion, Decreased endurance, Decreased strength, Hypomobility, Difficulty walking, Impaired UE functional use, Impaired vision/preception, Postural dysfunction, Impaired sensation  Visit Diagnosis: Muscle weakness (generalized)  Other abnormalities of gait and mobility  Unsteadiness on feet  Other symptoms and signs involving the nervous system     Problem List Patient Active Problem List   Diagnosis Date Noted   Dysarthria, post-stroke 11/03/2020   Benign essential HTN 06/09/2020   Abnormality of gait 04/28/2020   Neurogenic bladder 03/17/2020   Sepsis due to pneumonia ([place]) 03/06/2020   History of stroke with residual deficit 03/06/2020   Transient hypotension 03/06/2020   Spastic hemiplegia affecting nondominant side ([facility])    History of hypertension    Expressive aphasia    AKI (acute kidney injury) ([place])    Global aphasia    Hypoalbuminemia due to protein-calorie malnutrition ([place])    Dysphagia, post-stroke    Hyperlipidemia 02/02/2020   Dysphagia following cerebral infarction 02/02/2020   Aspiration pneumonia ([place]) 02/02/2020   Acute ischemic left MCA stroke ([place]) 02/02/2020   Acute respiratory failure ([facility])    Acute ischemic stroke ([place]) s/p clot retrieval L MCA & ACA A3 01/27/2020    Aspiration into airway 01/27/2020   Chronic anticoagulation 01/27/2020   Paroxysmal atrial fibrillation ([place]) 06/15/2018   Essential hypertension 06/15/2018   NICM (nonischemic cardiomyopathy) ([place]) 06/15/2018   Visit for monitoring Tikosyn therapy 06/12/2018    Arliss Journey, PT, DPT  02/15/2021, 3:59 PM  [facility] [address] [place], [place], [address] Phone: [phone]   Fax:  [phone]  Name: Dustin Schneider MRN: [id number] Date of Birth: Jan 09, 1956

## 2021-02-20 ENCOUNTER — Ambulatory Visit: Payer: BC Managed Care – PPO | Admitting: Physical Therapy

## 2021-02-20 ENCOUNTER — Other Ambulatory Visit: Payer: Self-pay

## 2021-02-20 ENCOUNTER — Encounter: Payer: Self-pay | Admitting: Physical Therapy

## 2021-02-20 DIAGNOSIS — M6281 Muscle weakness (generalized): Secondary | ICD-10-CM

## 2021-02-20 DIAGNOSIS — R2689 Other abnormalities of gait and mobility: Secondary | ICD-10-CM

## 2021-02-20 DIAGNOSIS — R2681 Unsteadiness on feet: Secondary | ICD-10-CM

## 2021-02-20 NOTE — Therapy (Signed)
[facility] [address] [place], [place], [address] Phone: [phone]   Fax:  [phone]  Physical Therapy Treatment  Patient Details  Name: Dustin Schneider MRN: [id number] Date of Birth: 04-Sep-1955 Referring Provider (PT): Lauraine Rinne, PA-C but followed by Delice Lesch   Encounter Date: 02/20/2021   PT End of Session - 02/20/21 1427     Visit Number 93    Number of Visits 104    Date for PT Re-Evaluation 04/16/21    Authorization Type BCBS    PT Start Time 1315    PT Stop Time 1400    PT Time Calculation (min) 45 min    Equipment Utilized During Treatment Gait belt;Other (comment)   long floatation barbell, neck noodle, pool noodle, floatation belt   Activity Tolerance Patient tolerated treatment well    Behavior During Therapy [facility] for tasks assessed/performed             Past Medical History:  Diagnosis Date   Hypertension    NICM (nonischemic cardiomyopathy) ([place]) 06/15/2018   04/23/18: EF 45%, 09/15/18: NOrmal LVEF   Paroxysmal atrial fibrillation ([place]) 06/15/2018   Stroke ([place])    Visit for monitoring Tikosyn therapy 06/12/2018    Past Surgical History:  Procedure Laterality Date   BUBBLE STUDY  03/11/2020   Procedure: BUBBLE STUDY;  Surgeon: [name], MD;  Location: [facility];  Service: Cardiovascular;;   CARDIOVERSION N/A 05/13/2018   Procedure: CARDIOVERSION;  Surgeon: [name], MD;  Location: [place];  Service: Cardiovascular;  Laterality: N/A;   CARDIOVERSION N/A 06/13/2018   Procedure: CARDIOVERSION;  Surgeon: [name], MD;  Location: [facility];  Service: Cardiovascular;  Laterality: N/A;   CARDIOVERSION N/A 06/23/2018   Procedure: CARDIOVERSION;  Surgeon: [name], MD;  Location: [facility];  Service: Cardiovascular;  Laterality: N/A;   CARDIOVERSION N/A 03/11/2020   Procedure: CARDIOVERSION;  Surgeon: [name], MD;  Location: [place];  Service: Cardiovascular;   Laterality: N/A;   excision of actinic keratosis     EYE SURGERY     eye lift   IR CT HEAD LTD  01/27/2020   IR PERCUTANEOUS ART THROMBECTOMY/INFUSION INTRACRANIAL INC DIAG ANGIO  01/27/2020       IR PERCUTANEOUS ART THROMBECTOMY/INFUSION INTRACRANIAL INC DIAG ANGIO  01/27/2020   RADIOLOGY WITH ANESTHESIA N/A 01/27/2020   Procedure: IR WITH ANESTHESIA;  Surgeon: Radiologist, Medication, MD;  Location: [facility];  Service: Radiology;  Laterality: N/A;   TEE WITHOUT CARDIOVERSION N/A 03/11/2020   Procedure: TRANSESOPHAGEAL ECHOCARDIOGRAM (TEE);  Surgeon: [name], MD;  Location: [place];  Service: Cardiovascular;  Laterality: N/A;   TONSILLECTOMY     WISDOM TOOTH EXTRACTION      There were no vitals filed for this visit.   Subjective Assessment - 02/20/21 1426     Subjective "This is the highlight of his week" states caregiver regarding aquatic therapy.    Patient is accompained by: Family member   Dustin Schneider   Pertinent History PMH: HTN, paroxysmal a fib    Limitations Standing;Walking    Patient Stated Goals "wants everything working on R side again"    Currently in Pain? No/denies           Aquatic therapy at [facility]   Patient seen for aquatic therapy today.  Treatment took place in water 3.6-4.6 feet deep depending upon activity.  Pt entered the pool via step negotiation with min guard assist with use of hand rail LUE.  Pt wearing tennis shoes and his R AFO.   Gait training with pt holding pool edge vs no UE assist with PTA providing guard assist with gait belt.  Gait training forward, backwards and side stepping. Cues to increase extension on RLE and proper foot placement with side stepping.  Pt performed side stepping with squat as well with pt holding long barbell.     In 4.5 ft of water for half jumping jacks.  Pt initially with difficulty coordinating bil LE's but able to perform x 20 reps x 2 sets with cues and attempts.  Pt using pool wall for UE support.    Standing at pool edge for bil LE squats x 15 reps x 2 sets.  Single leg x 15 reps x 2 sets.   Pt in supine position with floatation belt, neck noode and pool noodle.  Performed relaxation floating and PROM hip/knee flexion and hip abd on R.  Pt performed bicycling LE's x 20' x 4.  Hip abd/add x 20 reps x 3 sets.     Pt requires buoyancy of water for support for safety with gait training and with standing balance; viscosity of water needed for resistance for strengthening       PT Short Term Goals - 02/15/21 1553       PT SHORT TERM GOAL #1   Title Pt will ambulate at least 500' over unlevel paved and grass surfaces with supervision in order to demo improved community mobility. ALL STGS DUE 03/08/21    Time 3    Period Weeks    Status New    Target Date 03/08/21               PT Long Term Goals - 02/15/21 1602       PT LONG TERM GOAL #1   Title Pt will be independent with final HEP for land and aquatic therapy in order to build upon functional gains made in therapy. ALL LTGS DUE 03/29/21    Time 6    Period Weeks    Status New    Target Date 03/29/21      PT LONG TERM GOAL #2   Title Pt will increase 6 min walk from 689' to > 730' to demo improved gait endurance.    Baseline 12/26/20 540'; 689' on 02/10/21    Time 6    Period Weeks    Status Revised      PT LONG TERM GOAL #3   Title Pt will increase gait speed from 0.50 m/s with cane to >0.57 m/s for improved community mobility.    Baseline .50 m/s on 01/20/21    Time 6    Period Weeks    Status New                   Plan - 02/20/21 1429     Clinical Impression Statement Skilled session continues to focus on strength, gait, balance and coordination/motor planning.  Pt works hard during session and has progressed with decreased reliance on UE support with some activities.  Cont per poc.    Personal Factors and Comorbidities Comorbidity 3+    Comorbidities dense L MCA CVA, paroxysmal A. fib on chronic Xarelto,  nonischemic cardiomyopathy and hypertension.    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 tall kneeling/half kneeling activities to pt's tolerance. working on [employer] gait distances. balance strategies on compliant surfaces. activites to for incr stance time on RLE and getting more hip extension.    PT Home Exercise Plan on 06/28/20 - added standing at countetop and stepping LLE out and in    Consulted and Agree with Plan of Care Patient    Family Member Consulted Dustin Schneider             Patient will benefit from skilled therapeutic intervention in order to improve the following deficits and impairments:  Abnormal gait, Decreased activity tolerance, Decreased balance, Decreased cognition, Decreased mobility, Decreased coordination, Decreased range of motion, Decreased endurance, Decreased strength, Hypomobility, Difficulty walking, Impaired UE functional use, Impaired vision/preception, Postural dysfunction, Impaired sensation  Visit Diagnosis: Muscle weakness (generalized)  Other abnormalities of gait and mobility  Unsteadiness on feet     Problem List Patient Active Problem List    Diagnosis Date Noted   Dysarthria, post-stroke 11/03/2020   Benign essential HTN 06/09/2020   Abnormality of gait 04/28/2020   Neurogenic bladder 03/17/2020   Sepsis due to pneumonia ([place]) 03/06/2020   History of stroke with residual deficit 03/06/2020   Transient hypotension 03/06/2020   Spastic hemiplegia affecting nondominant side ([place])    History of hypertension    Expressive aphasia    AKI (acute kidney injury) ([place])    Global aphasia    Hypoalbuminemia due to protein-calorie malnutrition ([place])    Dysphagia, post-stroke    Hyperlipidemia 02/02/2020   Dysphagia following cerebral infarction 02/02/2020   Aspiration pneumonia ([place]) 02/02/2020   Acute ischemic left MCA stroke ([place]) 02/02/2020   Acute respiratory failure (H[facility])    Acute ischemic stroke ([place]) s/p clot retrieval L MCA & ACA A3 01/27/2020   Aspiration into airway 01/27/2020   Chronic anticoagulation 01/27/2020   Paroxysmal atrial fibrillation ([place]) 06/15/2018   Essential hypertension 06/15/2018   NICM (nonischemic cardiomyopathy) ([place]) 06/15/2018   Visit for monitoring Tikosyn therapy 06/12/2018   Narda Bonds, PTA [facility] 02/20/21 2:39 PM Phone: [phone] Fax: [facility] [address] [place], [place], [address] Phone: [phone]   Fax:  [phone]  Name: PRIMO INNIS MRN: [id number] Date of Birth: 07/27/1956

## 2021-02-21 ENCOUNTER — Other Ambulatory Visit: Payer: Self-pay | Admitting: Cardiology

## 2021-02-22 ENCOUNTER — Ambulatory Visit: Payer: BC Managed Care – PPO | Admitting: Physical Therapy

## 2021-02-22 ENCOUNTER — Encounter: Payer: Self-pay | Admitting: Physical Therapy

## 2021-02-22 ENCOUNTER — Other Ambulatory Visit: Payer: Self-pay

## 2021-02-22 DIAGNOSIS — R2689 Other abnormalities of gait and mobility: Secondary | ICD-10-CM

## 2021-02-22 DIAGNOSIS — R2681 Unsteadiness on feet: Secondary | ICD-10-CM

## 2021-02-22 DIAGNOSIS — R29818 Other symptoms and signs involving the nervous system: Secondary | ICD-10-CM

## 2021-02-22 DIAGNOSIS — M6281 Muscle weakness (generalized): Secondary | ICD-10-CM

## 2021-02-22 NOTE — Therapy (Addendum)
[facility] [address] [place], [place], [address] Phone: [phone]   Fax:  [phone]  Physical Therapy Treatment  Patient Details  Name: Dustin Schneider MRN: [id number] Date of Birth: July 13, 1956 Referring Provider (PT): Lauraine Rinne, PA-C but followed by Delice Lesch   Encounter Date: 02/22/2021   PT End of Session - 02/22/21 1458     Visit Number 94    Number of Visits 104    Date for PT Re-Evaluation 04/16/21    Authorization Type BCBS    PT Start Time 1316    PT Stop Time 4193    PT Time Calculation (min) 42 min    Equipment Utilized During Treatment Gait belt    Activity Tolerance Patient tolerated treatment well    Behavior During Therapy [facility] for tasks assessed/performed             Past Medical History:  Diagnosis Date   Hypertension    NICM (nonischemic cardiomyopathy) ([place]) 06/15/2018   04/23/18: EF 45%, 09/15/18: NOrmal LVEF   Paroxysmal atrial fibrillation ([place]) 06/15/2018   Stroke ([place])    Visit for monitoring Tikosyn therapy 06/12/2018    Past Surgical History:  Procedure Laterality Date   BUBBLE STUDY  03/11/2020   Procedure: BUBBLE STUDY;  Surgeon: [name], MD;  Location: [place];  Service: Cardiovascular;;   CARDIOVERSION N/A 05/13/2018   Procedure: CARDIOVERSION;  Surgeon: [name], MD;  Location: [place];  Service: Cardiovascular;  Laterality: N/A;   CARDIOVERSION N/A 06/13/2018   Procedure: CARDIOVERSION;  Surgeon: [name], MD;  Location: [place];  Service: Cardiovascular;  Laterality: N/A;   CARDIOVERSION N/A 06/23/2018   Procedure: CARDIOVERSION;  Surgeon: [name], MD;  Location: [place];  Service: Cardiovascular;  Laterality: N/A;   CARDIOVERSION N/A 03/11/2020   Procedure: CARDIOVERSION;  Surgeon: [name], MD;  Location: [place];  Service: Cardiovascular;  Laterality: N/A;   excision of actinic keratosis     EYE SURGERY     eye lift    IR CT HEAD LTD  01/27/2020   IR PERCUTANEOUS ART THROMBECTOMY/INFUSION INTRACRANIAL INC DIAG ANGIO  01/27/2020       IR PERCUTANEOUS ART THROMBECTOMY/INFUSION INTRACRANIAL INC DIAG ANGIO  01/27/2020   RADIOLOGY WITH ANESTHESIA N/A 01/27/2020   Procedure: IR WITH ANESTHESIA;  Surgeon: Radiologist, Medication, MD;  Location: [place];  Service: Radiology;  Laterality: N/A;   TEE WITHOUT CARDIOVERSION N/A 03/11/2020   Procedure: TRANSESOPHAGEAL ECHOCARDIOGRAM (TEE);  Surgeon: [name], MD;  Location: [place];  Service: Cardiovascular;  Laterality: N/A;   TONSILLECTOMY     WISDOM TOOTH EXTRACTION      There were no vitals filed for this visit.   Subjective Assessment - 02/22/21 1319     Subjective No falls. Doing good.    Patient is accompained by: Family member   Dustin Schneider   Pertinent History PMH: HTN, paroxysmal a fib    Limitations Standing;Walking    Patient Stated Goals "wants everything working on R side again"    Currently in Pain? No/denies                               [facility] Adult PT Treatment/Exercise - 02/22/21 1320       Ambulation/Gait   Ambulation/Gait Yes    Ambulation/Gait Assistance 5: Supervision    Ambulation/Gait Assistance Details into and out of session    Assistive device Straight cane  with 6 prong tip   Gait Pattern Step-through pattern;Decreased stance time - right;Decreased hip/knee flexion - right;Abducted- right    Ambulation Surface Level;Indoor    Gait Comments BWS over treadmill x 15 min at 1.42mph (unweighted approx. 18#) with PT facilitating for incr R hip/knee flexion as well as verbal cues for R hip extension and longer stance time on RLE for incr step length with LLE. BP after gait: 117/69, HR: 65               Access Code: [email or web address] URL: [email or web address] Date: 02/22/2021 Prepared by: [name]  Began to review bolded exercises from HEP and progress as appropriate.   Exercises Supine Bridge  - 1 x daily - 5 x weekly - 2 sets - 10 reps - with squeezing pillow between thighs for hip ADD activation.  Supine Hip Internal and External Rotation - 2 x daily - 7 x weekly - 2 sets - 10 reps Clamshell - 1 x daily - 7 x weekly - 2 sets - 10 reps Side to Side Weight Shift with Counter Support - 2 x daily - 7 x weekly - 2 sets - 10 reps Mini Squat with Counter Support - 2 x daily - 7 x weekly - 2 sets - 10 reps - pt reports performing these consistently  Supine March with Resistance Band - 1 x daily - 5 x weekly - 2 sets - 10 reps - with use of blue tband, cues for slowed and controlled.      PT Education - 02/22/21 1458     Education Details began to make upgrades to HEP    Person(s) Educated Patient    Methods Explanation;Demonstration    Comprehension Verbalized understanding;Returned demonstration              PT Short Term Goals - 02/15/21 1553       PT SHORT TERM GOAL #1   Title Pt will ambulate at least 500' over unlevel paved and grass surfaces with supervision in order to demo improved community mobility. ALL STGS DUE 03/08/21    Time 3    Period Weeks    Status New    Target Date 03/08/21               PT Long Term Goals - 02/15/21 1602       PT LONG TERM GOAL #1   Title Pt will be independent with final HEP for land and aquatic therapy in order to build upon functional gains made in therapy. ALL LTGS DUE 03/29/21    Time 6    Period Weeks    Status New    Target Date 03/29/21      PT LONG TERM GOAL #2   Title Pt will increase 6 min walk from 689' to > 730' to demo improved gait endurance.    Baseline 12/26/20 540'; 689' on 02/10/21    Time 6    Period Weeks    Status Revised      PT LONG TERM GOAL #3   Title Pt will increase gait speed from 0.50 m/s with cane to >0.57 m/s for improved community mobility.    Baseline .50 m/s on 01/20/21    Time 6    Period Weeks    Status New                   Plan - 02/22/21 2136     Clinical Impression  Statement Pt motivated to incr time  on treadmill today - able to perform 15 minutes with body weight support with therapist facilitating R foot clearance and pt able to improve R stance time with LLE step length. Began to progress pt's HEP today, will continue to benefit from review and updates as appropriate. Will continue to progress towards LTGs.    Personal Factors and Comorbidities Comorbidity 3+    Comorbidities dense L MCA CVA, paroxysmal A. fib on chronic Xarelto, nonischemic cardiomyopathy and hypertension.    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, works as an Electrical engineer Evolving/Moderate complexity    Rehab Potential Good    PT Frequency 2x / week    PT Duration 6 weeks    PT Treatment/Interventions ADLs/Self Care Home Management;Aquatic Therapy;DME Instruction;Gait training;Stair training;Functional mobility training;Therapeutic activities;Therapeutic exercise;Electrical Stimulation;Balance training;Neuromuscular re-education;Wheelchair mobility training;Orthotic Fit/Training;Patient/family education;Passive range of motion;Energy conservation    PT Next Visit Plan finalizing pt's HEP/community program with anticipated D/C after this POC. tall kneeling/half kneeling activities to pt's tolerance. working on [employer] gait distances. balance strategies on compliant surfaces. activites to for incr stance time on RLE    PT Home Exercise Plan on 06/28/20 - added standing at countetop and stepping LLE out and in    Consulted and Agree with Plan of Care Patient    Family Member Consulted Dustin Schneider             Patient will benefit from skilled therapeutic intervention in order to improve the following deficits and impairments:  Abnormal gait, Decreased activity tolerance,  Decreased balance, Decreased cognition, Decreased mobility, Decreased coordination, Decreased range of motion, Decreased endurance, Decreased strength, Hypomobility, Difficulty walking, Impaired UE functional use, Impaired vision/preception, Postural dysfunction, Impaired sensation  Visit Diagnosis: Muscle weakness (generalized)  Other abnormalities of gait and mobility  Unsteadiness on feet  Other symptoms and signs involving the nervous system     Problem List Patient Active Problem List   Diagnosis Date Noted   Dysarthria, post-stroke 11/03/2020   Benign essential HTN 06/09/2020   Abnormality of gait 04/28/2020   Neurogenic bladder 03/17/2020   Sepsis due to pneumonia ([place]) 03/06/2020   History of stroke with residual deficit 03/06/2020   Transient hypotension 03/06/2020   Spastic hemiplegia affecting nondominant side ([place])    History of hypertension    Expressive aphasia    AKI (acute kidney injury) ([place])    Global aphasia    Hypoalbuminemia due to protein-calorie malnutrition ([place])    Dysphagia, post-stroke    Hyperlipidemia 02/02/2020   Dysphagia following cerebral infarction 02/02/2020   Aspiration pneumonia ([place]) 02/02/2020   Acute ischemic left MCA stroke ([place]) 02/02/2020   Acute respiratory failure ([facility])    Acute ischemic stroke ([place]) s/p clot retrieval L MCA & ACA A3 01/27/2020   Aspiration into airway 01/27/2020   Chronic anticoagulation 01/27/2020   Paroxysmal atrial fibrillation ([place]) 06/15/2018   Essential hypertension 06/15/2018   NICM (nonischemic cardiomyopathy) ([place]) 06/15/2018   Visit for monitoring Tikosyn therapy 06/12/2018    Arliss Journey, PT, DPT  02/22/2021, 9:40 PM  [facility] [address] [place], [place], [address] Phone: [phone]   Fax:  [phone]  Name: VICTORINO FATZINGER MRN: [id number] Date of Birth: June 03, 1956

## 2021-02-27 ENCOUNTER — Encounter: Payer: Self-pay | Admitting: Physical Therapy

## 2021-02-27 ENCOUNTER — Other Ambulatory Visit: Payer: Self-pay

## 2021-02-27 ENCOUNTER — Ambulatory Visit: Payer: BC Managed Care – PPO | Admitting: Physical Therapy

## 2021-02-27 DIAGNOSIS — R2681 Unsteadiness on feet: Secondary | ICD-10-CM

## 2021-02-27 DIAGNOSIS — M6281 Muscle weakness (generalized): Secondary | ICD-10-CM

## 2021-02-27 DIAGNOSIS — R2689 Other abnormalities of gait and mobility: Secondary | ICD-10-CM

## 2021-02-27 NOTE — Therapy (Signed)
[facility] [address] [place], [place], [address] Phone: [phone]   Fax:  [phone]  Physical Therapy Treatment  Patient Details  Name: Dustin Schneider MRN: [id number] Date of Birth: June 16, 1956 Referring Provider (PT): Lauraine Rinne, PA-C but followed by Delice Lesch   Encounter Date: 02/27/2021   PT End of Session - 02/27/21 1555     Visit Number 95    Number of Visits 104    Date for PT Re-Evaluation 04/16/21    Authorization Type BCBS    PT Start Time 1315    PT Stop Time 1400    PT Time Calculation (min) 45 min    Equipment Utilized During Treatment Gait belt;Other (comment)   neck noodle, floatation belt, pool noodle   Activity Tolerance Patient tolerated treatment well    Behavior During Therapy [facility] for tasks assessed/performed             Past Medical History:  Diagnosis Date   Hypertension    NICM (nonischemic cardiomyopathy) ([place]) 06/15/2018   04/23/18: EF 45%, 09/15/18: NOrmal LVEF   Paroxysmal atrial fibrillation ([place]) 06/15/2018   Stroke ([place])    Visit for monitoring Tikosyn therapy 06/12/2018    Past Surgical History:  Procedure Laterality Date   BUBBLE STUDY  03/11/2020   Procedure: BUBBLE STUDY;  Surgeon: [name], MD;  Location: [place];  Service: Cardiovascular;;   CARDIOVERSION N/A 05/13/2018   Procedure: CARDIOVERSION;  Surgeon: [name], MD;  Location: [place];  Service: Cardiovascular;  Laterality: N/A;   CARDIOVERSION N/A 06/13/2018   Procedure: CARDIOVERSION;  Surgeon: [name], MD;  Location: [facility];  Service: Cardiovascular;  Laterality: N/A;   CARDIOVERSION N/A 06/23/2018   Procedure: CARDIOVERSION;  Surgeon: [name], MD;  Location: [place];  Service: Cardiovascular;  Laterality: N/A;   CARDIOVERSION N/A 03/11/2020   Procedure: CARDIOVERSION;  Surgeon: [name], MD;  Location: [place];  Service: Cardiovascular;  Laterality: N/A;    excision of actinic keratosis     EYE SURGERY     eye lift   IR CT HEAD LTD  01/27/2020   IR PERCUTANEOUS ART THROMBECTOMY/INFUSION INTRACRANIAL INC DIAG ANGIO  01/27/2020       IR PERCUTANEOUS ART THROMBECTOMY/INFUSION INTRACRANIAL INC DIAG ANGIO  01/27/2020   RADIOLOGY WITH ANESTHESIA N/A 01/27/2020   Procedure: IR WITH ANESTHESIA;  Surgeon: Radiologist, Medication, MD;  Location: [place];  Service: Radiology;  Laterality: N/A;   TEE WITHOUT CARDIOVERSION N/A 03/11/2020   Procedure: TRANSESOPHAGEAL ECHOCARDIOGRAM (TEE);  Surgeon: [name], MD;  Location: [place];  Service: Cardiovascular;  Laterality: N/A;   TONSILLECTOMY     WISDOM TOOTH EXTRACTION      There were no vitals filed for this visit.   Subjective Assessment - 02/27/21 1554     Subjective Denies any falls or changes.    Patient is accompained by: Family member   [name]   Pertinent History PMH: HTN, paroxysmal a fib    Limitations Standing;Walking    Patient Stated Goals "wants everything working on R side again"    Currently in Pain? No/denies            Aquatic therapy at [facility]   Patient seen for aquatic therapy today.  Treatment took place in water 3.6-4.6 feet deep depending upon activity.  Pt entered the pool via step negotiation with min guard assist with use of hand rail LUE.  Pt wearing tennis shoes and his R AFO.  Gait training with pt holding pool edge vs no UE assist with PTA providing guard assist with gait belt.  Gait training forward, backwards and side stepping. Cues to increase extension on RLE and proper foot placement with side stepping.  Pt performed side stepping with squat as well.  Gait holding long barbell and high marching.     In 4.5 ft of water for half jumping jacks.  Pt initially with difficulty coordinating bil LE's but able to perform x 20 reps x 2 sets with cues and attempts.  Pt using pool wall for UE support.   Standing at pool edge for bil LE squats x 20 reps x 2  sets.  Single leg x 20 reps.   Pt in supine position with floatation belt, neck noode and pool noodle.  Performed relaxation floating and PROM hip/knee flexion and hip abd on R.  Pt performed bicycling LE's x 20' x 4.  Hip abd/add x 20 reps x 3 sets-one of these with PTA providing resistance against abd.    Stepping onto pool step with LUE support x 10 reps with RLE.   Pt requires buoyancy of water for support for safety with gait training and with standing balance; viscosity of water needed for resistance for strengthening        PT Short Term Goals - 02/15/21 1553       PT SHORT TERM GOAL #1   Title Pt will ambulate at least 500' over unlevel paved and grass surfaces with supervision in order to demo improved community mobility. ALL STGS DUE 03/08/21    Time 3    Period Weeks    Status New    Target Date 03/08/21               PT Long Term Goals - 02/15/21 1602       PT LONG TERM GOAL #1   Title Pt will be independent with final HEP for land and aquatic therapy in order to build upon functional gains made in therapy. ALL LTGS DUE 03/29/21    Time 6    Period Weeks    Status New    Target Date 03/29/21      PT LONG TERM GOAL #2   Title Pt will increase 6 min walk from 689' to > 730' to demo improved gait endurance.    Baseline 12/26/20 540'; 689' on 02/10/21    Time 6    Period Weeks    Status Revised      PT LONG TERM GOAL #3   Title Pt will increase gait speed from 0.50 m/s with cane to >0.57 m/s for improved community mobility.    Baseline .50 m/s on 01/20/21    Time 6    Period Weeks    Status New                   Plan - 02/27/21 1555     Clinical Impression Statement Skilled aquatic session focused on gait, balance, strength.  Pt progressing toward goals.  Cont per poc.    Personal Factors and Comorbidities Comorbidity 3+    Comorbidities dense L MCA CVA, paroxysmal A. fib on chronic Xarelto, nonischemic cardiomyopathy and hypertension.     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, works as an Electrical engineer Evolving/Moderate complexity    Rehab Potential Good    PT Frequency 2x / week  PT Duration 6 weeks    PT Treatment/Interventions ADLs/Self Care Home Management;Aquatic Therapy;DME Instruction;Gait training;Stair training;Functional mobility training;Therapeutic activities;Therapeutic exercise;Electrical Stimulation;Balance training;Neuromuscular re-education;Wheelchair mobility training;Orthotic Fit/Training;Patient/family education;Passive range of motion;Energy conservation    PT Next Visit Plan finalizing pt's HEP/community program with anticipated D/C after this POC. tall kneeling/half kneeling activities to pt's tolerance. working on [employer] gait distances. balance strategies on compliant surfaces. activites to for incr stance time on RLE    PT Home Exercise Plan on 06/28/20 - added standing at countetop and stepping LLE out and in    Consulted and Agree with Plan of Care Patient             Patient will benefit from skilled therapeutic intervention in order to improve the following deficits and impairments:  Abnormal gait, Decreased activity tolerance, Decreased balance, Decreased cognition, Decreased mobility, Decreased coordination, Decreased range of motion, Decreased endurance, Decreased strength, Hypomobility, Difficulty walking, Impaired UE functional use, Impaired vision/preception, Postural dysfunction, Impaired sensation  Visit Diagnosis: Unsteadiness on feet  Muscle weakness (generalized)  Other abnormalities of gait and mobility     Problem List Patient Active Problem List   Diagnosis Date Noted   Dysarthria, post-stroke 11/03/2020   Benign essential HTN 06/09/2020   Abnormality of  gait 04/28/2020   Neurogenic bladder 03/17/2020   Sepsis due to pneumonia ([place]) 03/06/2020   History of stroke with residual deficit 03/06/2020   Transient hypotension 03/06/2020   Spastic hemiplegia affecting nondominant side ([place])    History of hypertension    Expressive aphasia    AKI (acute kidney injury) ([place])    Global aphasia    Hypoalbuminemia due to protein-calorie malnutrition ([place])    Dysphagia, post-stroke    Hyperlipidemia 02/02/2020   Dysphagia following cerebral infarction 02/02/2020   Aspiration pneumonia ([place]) 02/02/2020   Acute ischemic left MCA stroke ([place]) 02/02/2020   Acute respiratory failure ([facility])    Acute ischemic stroke ([place]) s/p clot retrieval L MCA & ACA A3 01/27/2020   Aspiration into airway 01/27/2020   Chronic anticoagulation 01/27/2020   Paroxysmal atrial fibrillation ([place]) 06/15/2018   Essential hypertension 06/15/2018   NICM (nonischemic cardiomyopathy) ([place]) 06/15/2018   Visit for monitoring Tikosyn therapy 06/12/2018   Narda Bonds, PTA [facility] [facility] 02/27/21 4:01 PM Phone: [phone] Fax: [facility] [address] [place], [place], [address] Phone: [phone]   Fax:  [phone]  Name: Dustin Schneider MRN: [id number] Date of Birth: Apr 25, 1956

## 2021-02-28 ENCOUNTER — Other Ambulatory Visit: Payer: Self-pay | Admitting: Cardiology

## 2021-03-03 ENCOUNTER — Other Ambulatory Visit: Payer: Self-pay

## 2021-03-03 ENCOUNTER — Ambulatory Visit: Payer: BC Managed Care – PPO | Admitting: Physical Therapy

## 2021-03-03 DIAGNOSIS — M6281 Muscle weakness (generalized): Secondary | ICD-10-CM

## 2021-03-03 DIAGNOSIS — R2681 Unsteadiness on feet: Secondary | ICD-10-CM

## 2021-03-03 DIAGNOSIS — R2689 Other abnormalities of gait and mobility: Secondary | ICD-10-CM

## 2021-03-03 NOTE — Therapy (Signed)
[facility] [address] [place], [place], [address] Phone: [phone]   Fax:  [phone]  Physical Therapy Treatment  Patient Details  Name: Dustin Schneider MRN: [id number] Date of Birth: 1955-12-01 Referring Provider (PT): Lauraine Rinne, PA-C but followed by Delice Lesch   Encounter Date: 03/03/2021   PT End of Session - 03/03/21 1410     Visit Number 96    Number of Visits 104    Date for PT Re-Evaluation 04/16/21    Authorization Type BCBS    PT Start Time [id number]    PT Stop Time 1444    PT Time Calculation (min) 39 min    Equipment Utilized During Treatment Gait belt;Other (comment)   AFO   Activity Tolerance Patient tolerated treatment well    Behavior During Therapy [facility] for tasks assessed/performed             Past Medical History:  Diagnosis Date   Hypertension    NICM (nonischemic cardiomyopathy) ([place]) 06/15/2018   04/23/18: EF 45%, 09/15/18: NOrmal LVEF   Paroxysmal atrial fibrillation ([place]) 06/15/2018   Stroke ([place])    Visit for monitoring Tikosyn therapy 06/12/2018    Past Surgical History:  Procedure Laterality Date   BUBBLE STUDY  03/11/2020   Procedure: BUBBLE STUDY;  Surgeon: [name], MD;  Location: [facility];  Service: Cardiovascular;;   CARDIOVERSION N/A 05/13/2018   Procedure: CARDIOVERSION;  Surgeon: [name], MD;  Location: [facility];  Service: Cardiovascular;  Laterality: N/A;   CARDIOVERSION N/A 06/13/2018   Procedure: CARDIOVERSION;  Surgeon: [name], MD;  Location: [facility];  Service: Cardiovascular;  Laterality: N/A;   CARDIOVERSION N/A 06/23/2018   Procedure: CARDIOVERSION;  Surgeon: [name], MD;  Location: [place] [facility];  Service: Cardiovascular;  Laterality: N/A;   CARDIOVERSION N/A 03/11/2020   Procedure: CARDIOVERSION;  Surgeon: [name], MD;  Location: [facility];  Service: Cardiovascular;  Laterality: N/A;   excision of actinic keratosis     EYE  SURGERY     eye lift   IR CT HEAD LTD  01/27/2020   IR PERCUTANEOUS ART THROMBECTOMY/INFUSION INTRACRANIAL INC DIAG ANGIO  01/27/2020       IR PERCUTANEOUS ART THROMBECTOMY/INFUSION INTRACRANIAL INC DIAG ANGIO  01/27/2020   RADIOLOGY WITH ANESTHESIA N/A 01/27/2020   Procedure: IR WITH ANESTHESIA;  Surgeon: Radiologist, Medication, MD;  Location: [place];  Service: Radiology;  Laterality: N/A;   TEE WITHOUT CARDIOVERSION N/A 03/11/2020   Procedure: TRANSESOPHAGEAL ECHOCARDIOGRAM (TEE);  Surgeon: [name], MD;  Location: [facility];  Service: Cardiovascular;  Laterality: N/A;   TONSILLECTOMY     WISDOM TOOTH EXTRACTION      There were no vitals filed for this visit.   Subjective Assessment - 03/03/21 1409     Subjective No new complaints. No falls or pain to report. HEP is going "swimminlly".    Pertinent History PMH: HTN, paroxysmal a fib    Limitations Standing;Walking    Patient Stated Goals "wants everything working on R side again"    Currently in Pain? No/denies    Pain Score 0-No pain                   [facility] Adult PT Treatment/Exercise - 03/03/21 1411       Transfers   Transfers Sit to Stand;Stand to Sit    Sit to Stand 6: Modified independent (Device/Increase time)    Stand to Sit 6: Modified independent (Device/Increase time)  Ambulation/Gait   Ambulation/Gait Yes    Ambulation/Gait Assistance 5: Supervision    Ambulation/Gait Assistance Details cues for forward gaze. faciliation for right weight shifting with cues for increased left step length    Ambulation Distance (Feet) 805 Feet    Assistive device Straight cane   with 6 prong rubber tip   Gait Pattern Step-through pattern;Decreased stance time - right;Decreased hip/knee flexion - right;Abducted- right    Ambulation Surface Level;Indoor                 Balance Exercises - 03/03/21 1419       Balance Exercises: Standing   Rockerboard Anterior/posterior;Lateral;EO;EC;30 seconds;Other reps  (comment);Limitations    Rockerboard Limitations peformed both ways on balance board with no UE support: rocking the board with empahsis on tall posture with EO, progressing to EC with min guard to min assist; then holding the board steady for EC 30 sec's x 3 reps, progressing to EC head movements left<>right, up<>down for ~10 reps each. min guard to min assist. cues on weight shifting to assist with balance.    Balance Beam standing across red beam: alternating forward stepping to floor/back onto beam, then alternating backward stepping to floor/back onto beam for ~10 reps each with no UE support, min assist for balance with pt occasionally holding bar for balance assist/recovery.    Other Standing Exercises rocker board in ant/post direction with 4 inch box in front: right stance on board with left foot stepping floor<>box (over blance board)<>back to floor for 5 reps with single UE support progressing to no UE support for 10 reps. min guard with support, min to mod assist with no UE support.                 PT Short Term Goals - 02/15/21 1553       PT SHORT TERM GOAL #1   Title Pt will ambulate at least 500' over unlevel paved and grass surfaces with supervision in order to demo improved community mobility. ALL STGS DUE 03/08/21    Time 3    Period Weeks    Status New    Target Date 03/08/21               PT Long Term Goals - 02/15/21 1602       PT LONG TERM GOAL #1   Title Pt will be independent with final HEP for land and aquatic therapy in order to build upon functional gains made in therapy. ALL LTGS DUE 03/29/21    Time 6    Period Weeks    Status New    Target Date 03/29/21      PT LONG TERM GOAL #2   Title Pt will increase 6 min walk from 689' to > 730' to demo improved gait endurance.    Baseline 12/26/20 540'; 689' on 02/10/21    Time 6    Period Weeks    Status Revised      PT LONG TERM GOAL #3   Title Pt will increase gait speed from 0.50 m/s with cane to  >0.57 m/s for improved community mobility.    Baseline .50 m/s on 01/20/21    Time 6    Period Weeks    Status New                   Plan - 03/03/21 1410     Clinical Impression Statement Today's skilled session continued to focus on gait distance and balance activiites  with emphasis on right LE stance time/weight bearing. Pt reporting right LE fatigue at end of session, no other issues noted or reported in session. The pt is making steady progress toward goals and should benefit from continued PT to progress toward unmet goals.    Personal Factors and Comorbidities Comorbidity 3+    Comorbidities dense L MCA CVA, paroxysmal A. fib on chronic Xarelto, nonischemic cardiomyopathy and hypertension.    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, works as an Electrical engineer Evolving/Moderate complexity    Rehab Potential Good    PT Frequency 2x / week    PT Duration 6 weeks    PT Treatment/Interventions ADLs/Self Care Home Management;Aquatic Therapy;DME Instruction;Gait training;Stair training;Functional mobility training;Therapeutic activities;Therapeutic exercise;Electrical Stimulation;Balance training;Neuromuscular re-education;Wheelchair mobility training;Orthotic Fit/Training;Patient/family education;Passive range of motion;Energy conservation    PT Next Visit Plan finalizing pt's HEP/community program with anticipated D/C after this POC. tall kneeling/half kneeling activities to pt's tolerance. working on [employer] gait distances. balance strategies on compliant surfaces. activites to for incr stance time on RLE    PT Home Exercise Plan on 06/28/20 - added standing at countetop and stepping LLE out and in    Consulted and Agree with Plan of Care Patient              Patient will benefit from skilled therapeutic intervention in order to improve the following deficits and impairments:  Abnormal gait, Decreased activity tolerance, Decreased balance, Decreased cognition, Decreased mobility, Decreased coordination, Decreased range of motion, Decreased endurance, Decreased strength, Hypomobility, Difficulty walking, Impaired UE functional use, Impaired vision/preception, Postural dysfunction, Impaired sensation  Visit Diagnosis: Muscle weakness (generalized)  Unsteadiness on feet  Other abnormalities of gait and mobility     Problem List Patient Active Problem List   Diagnosis Date Noted   Dysarthria, post-stroke 11/03/2020   Benign essential HTN 06/09/2020   Abnormality of gait 04/28/2020   Neurogenic bladder 03/17/2020   Sepsis due to pneumonia ([place]) 03/06/2020   History of stroke with residual deficit 03/06/2020   Transient hypotension 03/06/2020   Spastic hemiplegia affecting nondominant side ([place])    History of hypertension    Expressive aphasia    AKI (acute kidney injury) ([place])    Global aphasia    Hypoalbuminemia due to protein-calorie malnutrition ([place])    Dysphagia, post-stroke    Hyperlipidemia 02/02/2020   Dysphagia following cerebral infarction 02/02/2020   Aspiration pneumonia ([place]) 02/02/2020   Acute ischemic left MCA stroke ([place]) 02/02/2020   Acute respiratory failure ([facility])    Acute ischemic stroke ([place]) s/p clot retrieval L MCA & ACA A3 01/27/2020   Aspiration into airway 01/27/2020   Chronic anticoagulation 01/27/2020   Paroxysmal atrial fibrillation ([place]) 06/15/2018   Essential hypertension 06/15/2018   NICM (nonischemic cardiomyopathy) ([place]) 06/15/2018   Visit for monitoring Tikosyn therapy 06/12/2018    Willow Ora, PTA, [facility] [address], [place], [place] [address] [phone] 03/03/21, 5:15 PM   Name: Dustin Schneider MRN: [id number] Date of Birth: [birth year]

## 2021-03-07 ENCOUNTER — Ambulatory Visit: Payer: BC Managed Care – PPO | Admitting: Physical Therapy

## 2021-03-08 ENCOUNTER — Ambulatory Visit: Payer: BC Managed Care – PPO | Admitting: Physical Therapy

## 2021-03-14 ENCOUNTER — Encounter: Payer: Self-pay | Admitting: Physical Therapy

## 2021-03-14 ENCOUNTER — Other Ambulatory Visit: Payer: Self-pay

## 2021-03-14 ENCOUNTER — Ambulatory Visit: Payer: BC Managed Care – PPO | Attending: Physician Assistant | Admitting: Physical Therapy

## 2021-03-14 DIAGNOSIS — R2681 Unsteadiness on feet: Secondary | ICD-10-CM | POA: Diagnosis present

## 2021-03-14 DIAGNOSIS — M6281 Muscle weakness (generalized): Secondary | ICD-10-CM | POA: Insufficient documentation

## 2021-03-14 DIAGNOSIS — R2689 Other abnormalities of gait and mobility: Secondary | ICD-10-CM | POA: Insufficient documentation

## 2021-03-14 NOTE — Patient Instructions (Addendum)
Access Code: [email or web address] URL: [email or web address] Date: 03/14/2021 Prepared by: [name]  Exercises Supine Bridge - 1 x daily - 5 x weekly - 2 sets - 10 reps Supine Hip Internal and External Rotation - 2 x daily - 5 x weekly - 2 sets - 10 reps Clamshell - 1 x daily - 5 x weekly - 1-2 sets - 10 reps Side to Side Weight Shift with Counter Support - 2 x daily - 5 x weekly - 2 sets - 10 reps Mini Squat with Counter Support - 2 x daily - 5 x weekly - 2 sets - 10 reps Supine March with Resistance Band - 1 x daily - 5 x weekly - 2 sets - 10 reps    Aquatic HEP:  Access Code: [email or web address] URL: [email or web address] Date: 03/14/2021 Prepared by: Nita Sells  Exercises Standing Hip Abduction Adduction at [facility] - 1 x daily - 2 x weekly - 10 reps Standing Hip Flexion Extension at [employer] - 1 x daily - 2 x weekly - 10 reps Standing March at [facility] - 1 x daily - 2 x weekly - 10 reps Forward and Backward Stepping at [employer] - 1 x daily - 2 x weekly - 10 reps Forward and Backward Weight Shift with Stepping at [employer] - 1 x daily - 2 x weekly - 10 reps Lateral Weight Shift with Step at [place] - 1 x daily - 2 x weekly - 10 reps Forward Walking - 1 x daily - 2 x weekly - 4 sets Backward Walking - 1 x daily - 2 x weekly - 4 sets Forward March - 1 x daily - 2 x weekly - 2 sets Squat - 1 x daily - 2 x weekly - 10 reps Single Leg Squat - 1 x daily - 2 x weekly - 10 reps

## 2021-03-14 NOTE — Therapy (Addendum)
[facility] [address] [place], [place], [address] Phone: [phone]   Fax:  [phone]  Physical Therapy Treatment/Discharge Summary  Patient Details  Name: Dustin Schneider MRN: [id number] Date of Birth: 11-21-1955 Referring Provider (PT): Lauraine Rinne, PA-C but followed by Delice Lesch   Encounter Date: 03/14/2021   PT End of Session - 03/14/21 7616     Visit Number 64    Number of Visits 104    Date for PT Re-Evaluation 04/16/21    Authorization Type BCBS    PT Start Time 1446    PT Stop Time 0737    PT Time Calculation (min) 44 min    Equipment Utilized During Treatment Gait belt    Activity Tolerance Patient tolerated treatment well    Behavior During Therapy [facility] for tasks assessed/performed             Past Medical History:  Diagnosis Date   Hypertension    NICM (nonischemic cardiomyopathy) ([place]) 06/15/2018   04/23/18: EF 45%, 09/15/18: NOrmal LVEF   Paroxysmal atrial fibrillation ([place]) 06/15/2018   Stroke ([place])    Visit for monitoring Tikosyn therapy 06/12/2018    Past Surgical History:  Procedure Laterality Date   BUBBLE STUDY  03/11/2020   Procedure: BUBBLE STUDY;  Surgeon: [name], MD;  Location: [facility];  Service: Cardiovascular;;   CARDIOVERSION N/A 05/13/2018   Procedure: CARDIOVERSION;  Surgeon: [name], MD;  Location: [facility];  Service: Cardiovascular;  Laterality: N/A;   CARDIOVERSION N/A 06/13/2018   Procedure: CARDIOVERSION;  Surgeon: [name], MD;  Location: [facility];  Service: Cardiovascular;  Laterality: N/A;   CARDIOVERSION N/A 06/23/2018   Procedure: CARDIOVERSION;  Surgeon: [name], MD;  Location: [facility];  Service: Cardiovascular;  Laterality: N/A;   CARDIOVERSION N/A 03/11/2020   Procedure: CARDIOVERSION;  Surgeon: [name], MD;  Location: [facility];  Service: Cardiovascular;  Laterality: N/A;   excision of actinic keratosis     EYE  SURGERY     eye lift   IR CT HEAD LTD  01/27/2020   IR PERCUTANEOUS ART THROMBECTOMY/INFUSION INTRACRANIAL INC DIAG ANGIO  01/27/2020       IR PERCUTANEOUS ART THROMBECTOMY/INFUSION INTRACRANIAL INC DIAG ANGIO  01/27/2020   RADIOLOGY WITH ANESTHESIA N/A 01/27/2020   Procedure: IR WITH ANESTHESIA;  Surgeon: Radiologist, Medication, MD;  Location: [facility];  Service: Radiology;  Laterality: N/A;   TEE WITHOUT CARDIOVERSION N/A 03/11/2020   Procedure: TRANSESOPHAGEAL ECHOCARDIOGRAM (TEE);  Surgeon: [name], MD;  Location: [facility];  Service: Cardiovascular;  Laterality: N/A;   TONSILLECTOMY     WISDOM TOOTH EXTRACTION      There were no vitals filed for this visit.   Subjective Assessment - 03/14/21 1448     Subjective No changes, no falls.    Pertinent History PMH: HTN, paroxysmal a fib    Limitations Standing;Walking    Patient Stated Goals "wants everything working on R side again"    Currently in Pain? No/denies                               [facility] Adult PT Treatment/Exercise - 03/14/21 0001       Ambulation/Gait   Ambulation/Gait Yes    Ambulation/Gait Assistance 5: Supervision    Ambulation/Gait Assistance Details during 6MWT, cues intermittently for posture    Ambulation Distance (Feet) 614 Feet    Assistive device Straight cane  with 6 prong tip   Gait Pattern Step-through pattern;Decreased stance time - right;Decreased hip/knee flexion - right;Abducted- right    Ambulation Surface Level;Indoor      Therapeutic Activites    Therapeutic Activities Other Therapeutic Activities    Other Therapeutic Activities discussed final exercise program and provided new handout and D/C plan for exercise - pt to look into joining a gym ([employer]) to use seated Stepper for aerobic activity/strengthening, using the pool to perform aquatic HEP (with caregiver or gf [name]), and using the walking track indoors for gait with caregiver. PT reached out to pt's aquatic PT to  send HEP to [facility] to [facility] for pt to continue performing. PT also called [name] earlier today and explained information above and verbalized understanding/agreement              Access Code: [email or web address] URL: [email or web address] Date: 03/14/2021 Prepared by: [name]  Reviewed HEP below for strengthening, ROM, balance. See MedBridge for further details.    When PT receives aquatic HEP from PTA, will email to [place].     Exercises Supine Bridge - 1 x daily - 5 x weekly - 2 sets - 10 reps Supine Hip Internal and External Rotation - 2 x daily - 5 x weekly - 2 sets - 10 reps Clamshell - 1 x daily - 5 x weekly - 1-2 sets - 10 reps Side to Side Weight Shift with Counter Support - 2 x daily - 5 x weekly - 2 sets - 10 reps Mini Squat with Counter Support - 2 x daily - 5 x weekly - 2 sets - 10 reps Supine March with Resistance Band - 1 x daily - 5 x weekly - 2 sets - 10 reps       PT Education - 03/14/21 1531     Education Details final HEP, see TA    Person(s) Educated Patient;Caregiver(s)    Methods Explanation;Demonstration;Handout    Comprehension Verbalized understanding;Returned demonstration              PT Short Term Goals - 02/15/21 1553       PT SHORT TERM GOAL #1   Title Pt will ambulate at least 500' over unlevel paved and grass surfaces with supervision in order to demo improved community mobility. ALL STGS DUE 03/08/21    Time 3    Period Weeks    Status New    Target Date 03/08/21               PT Long Term Goals - 03/14/21 1500       PT LONG TERM GOAL #1   Title Pt will be independent with final HEP for land and aquatic therapy in order to build upon functional gains made in therapy. ALL LTGS DUE 03/29/21    Baseline reviewed and provided for pt on 03/14/21    Time 6    Period Weeks    Status Achieved      PT LONG TERM GOAL #2   Title Pt will increase 6 min walk from 689' to > 730' to demo improved gait endurance.    Baseline  12/26/20 540'; 689' on 02/10/21 - 03/14/21 - 612', pt reporting he already walked 1/4 of a mile this morning and legs are more fatigued    Time 6    Period Weeks    Status Not Met      PT LONG TERM GOAL #3   Title Pt will increase gait speed from 0.50 m/s with  cane to >0.57 m/s for improved community mobility.    Baseline .50 m/s on 01/20/21, did not assess at D/C    Time 6    Period Weeks    Status New            PHYSICAL THERAPY DISCHARGE SUMMARY  Visits from Start of Care: 24  Current functional level related to goals / functional outcomes: See LTGs - using SPC with 6 prong tip for household and community mobility.   Remaining deficits: Impaired strength, R hemiparesis, impaired balance, gait abnormalities.   Education / Equipment: HEP   Patient agrees to discharge. Patient goals were partially met/met. Patient is being discharged due to meeting his goals and maximizing his rehab potential.        Plan - 03/15/21 1227     Clinical Impression Statement Pt to be discharged from PT after this visit due to meeting his goals and maximizing his rehab potential. Pt did not meet 6MWT goal today - able to ambulate 612' with SPC with 6 prong tip. Pt reports he already went on a walk this morning and legs are more fatigued. Reviewed pt's final HEP to continue to work on for strengthening and PT to email pt's HEP for the pool. Pt to also look into gym membership to use with caregiver/gf to use pool,and seated stepper for aerobic activity. Pt is in agreement with discharge plan and is very pleased with his progress with PT.    Personal Factors and Comorbidities Comorbidity 3+    Comorbidities dense L MCA CVA, paroxysmal A. fib on chronic Xarelto, nonischemic cardiomyopathy and hypertension.    Examination-Activity Limitations Bathing;Bed Mobility;Bend;Dressing;Hygiene/Grooming;Stand;Squat;Locomotion Level;Transfers    Examination-Participation Restrictions Community Activity;Yard Work;Shop    walking his dog, works as an Electrical engineer Evolving/Moderate complexity    Rehab Potential Good    PT Frequency 2x / week    PT Duration 6 weeks    PT Treatment/Interventions ADLs/Self Care Home Management;Aquatic Therapy;DME Instruction;Gait training;Stair training;Functional mobility training;Therapeutic activities;Therapeutic exercise;Electrical Stimulation;Balance training;Neuromuscular re-education;Wheelchair mobility training;Orthotic Fit/Training;Patient/family education;Passive range of motion;Energy conservation    PT Next Visit Plan D/C from PT    PT Home Exercise Plan on 06/28/20 - added standing at countetop and stepping LLE out and in    Consulted and Agree with Plan of Care Patient             Patient will benefit from skilled therapeutic intervention in order to improve the following deficits and impairments:  Abnormal gait, Decreased activity tolerance, Decreased balance, Decreased cognition, Decreased mobility, Decreased coordination, Decreased range of motion, Decreased endurance, Decreased strength, Hypomobility, Difficulty walking, Impaired UE functional use, Impaired vision/preception, Postural dysfunction, Impaired sensation  Visit Diagnosis: Muscle weakness (generalized)  Unsteadiness on feet  Other abnormalities of gait and mobility     Problem List Patient Active Problem List   Diagnosis Date Noted   Dysarthria, post-stroke 11/03/2020   Benign essential HTN 06/09/2020   Abnormality of gait 04/28/2020   Neurogenic bladder 03/17/2020   Sepsis due to pneumonia ([place]) 03/06/2020   History of stroke with residual deficit 03/06/2020   Transient hypotension 03/06/2020   Spastic hemiplegia affecting nondominant side ([facility])    History of hypertension    Expressive aphasia    AKI (acute kidney injury) ([place])    Global aphasia    Hypoalbuminemia due to protein-calorie malnutrition ([place])    Dysphagia, post-stroke     Hyperlipidemia 02/02/2020   Dysphagia following cerebral infarction 02/02/2020  Aspiration pneumonia ([place]) 02/02/2020   Acute ischemic left MCA stroke ([place] [facility]) 02/02/2020   Acute respiratory failure ([facility])    Acute ischemic stroke ([place]) s/p clot retrieval L MCA & ACA A3 01/27/2020   Aspiration into airway 01/27/2020   Chronic anticoagulation 01/27/2020   Paroxysmal atrial fibrillation ([place]) 06/15/2018   Essential hypertension 06/15/2018   NICM (nonischemic cardiomyopathy) ([place]) 06/15/2018   Visit for monitoring Tikosyn therapy 06/12/2018    Arliss Journey, PT, DPT  03/15/2021, 12:30 PM  [facility] [address] [place], [place], [address] Phone: [phone]   Fax:  [phone]  Name: Dustin Schneider MRN: [id number] Date of Birth: 03/12/1956

## 2021-03-15 ENCOUNTER — Ambulatory Visit: Payer: BC Managed Care – PPO | Admitting: Physical Therapy

## 2021-03-21 ENCOUNTER — Ambulatory Visit: Payer: BC Managed Care – PPO | Admitting: Physical Therapy

## 2021-03-22 ENCOUNTER — Ambulatory Visit: Payer: BC Managed Care – PPO | Admitting: Physical Therapy

## 2021-03-27 ENCOUNTER — Other Ambulatory Visit: Payer: Self-pay | Admitting: Internal Medicine

## 2021-03-27 ENCOUNTER — Ambulatory Visit (INDEPENDENT_AMBULATORY_CARE_PROVIDER_SITE_OTHER): Payer: BC Managed Care – PPO | Admitting: Internal Medicine

## 2021-03-27 ENCOUNTER — Encounter: Payer: Self-pay | Admitting: Internal Medicine

## 2021-03-27 ENCOUNTER — Other Ambulatory Visit: Payer: Self-pay

## 2021-03-27 VITALS — BP 126/78 | HR 60 | Temp 98.8°F | Resp 16 | Ht 68.0 in | Wt 154.0 lb

## 2021-03-27 DIAGNOSIS — D539 Nutritional anemia, unspecified: Secondary | ICD-10-CM | POA: Insufficient documentation

## 2021-03-27 DIAGNOSIS — I480 Paroxysmal atrial fibrillation: Secondary | ICD-10-CM | POA: Diagnosis not present

## 2021-03-27 DIAGNOSIS — E785 Hyperlipidemia, unspecified: Secondary | ICD-10-CM

## 2021-03-27 DIAGNOSIS — N400 Enlarged prostate without lower urinary tract symptoms: Secondary | ICD-10-CM

## 2021-03-27 DIAGNOSIS — Z23 Encounter for immunization: Secondary | ICD-10-CM

## 2021-03-27 DIAGNOSIS — Z125 Encounter for screening for malignant neoplasm of prostate: Secondary | ICD-10-CM

## 2021-03-27 DIAGNOSIS — R972 Elevated prostate specific antigen [PSA]: Secondary | ICD-10-CM

## 2021-03-27 DIAGNOSIS — Z1211 Encounter for screening for malignant neoplasm of colon: Secondary | ICD-10-CM

## 2021-03-27 DIAGNOSIS — I63512 Cerebral infarction due to unspecified occlusion or stenosis of left middle cerebral artery: Secondary | ICD-10-CM

## 2021-03-27 DIAGNOSIS — I10 Essential (primary) hypertension: Secondary | ICD-10-CM | POA: Diagnosis not present

## 2021-03-27 LAB — CBC WITH DIFFERENTIAL/PLATELET
Basophils Absolute: 0 10*3/uL (ref 0.0–0.1)
Basophils Relative: 0.4 % (ref 0.0–3.0)
Eosinophils Absolute: 0.1 10*3/uL (ref 0.0–0.7)
Eosinophils Relative: 1.5 % (ref 0.0–5.0)
HCT: 45.3 % (ref 39.0–52.0)
Hemoglobin: 14.9 g/dL (ref 13.0–17.0)
Lymphocytes Relative: 25.5 % (ref 12.0–46.0)
Lymphs Abs: 1.9 10*3/uL (ref 0.7–4.0)
MCHC: 32.9 g/dL (ref 30.0–36.0)
MCV: 95.5 fl (ref 78.0–100.0)
Monocytes Absolute: 0.4 10*3/uL (ref 0.1–1.0)
Monocytes Relative: 5.8 % (ref 3.0–12.0)
Neutro Abs: 4.9 10*3/uL (ref 1.4–7.7)
Neutrophils Relative %: 66.8 % (ref 43.0–77.0)
Platelets: 215 10*3/uL (ref 150.0–400.0)
RBC: 4.74 Mil/uL (ref 4.22–5.81)
RDW: 14.1 % (ref 11.5–15.5)
WBC: 7.3 10*3/uL (ref 4.0–10.5)

## 2021-03-27 LAB — TSH: TSH: 2.08 u[IU]/mL (ref 0.35–5.50)

## 2021-03-27 LAB — IRON: Iron: 116 ug/dL (ref 42–165)

## 2021-03-27 LAB — LIPID PANEL
Cholesterol: 137 mg/dL (ref 0–200)
HDL: 49.3 mg/dL (ref >39.00)
LDL Cholesterol: 52 mg/dL (ref 0–99)
NonHDL: 87.26
Total CHOL/HDL Ratio: 3
Triglycerides: 178 mg/dL — ABNORMAL HIGH (ref 0.0–149.0)
VLDL: 35.6 mg/dL (ref 0.0–40.0)

## 2021-03-27 LAB — BASIC METABOLIC PANEL
BUN: 20 mg/dL (ref 6–23)
CO2: 32 mEq/L (ref 19–32)
Calcium: 9.7 mg/dL (ref 8.4–10.5)
Chloride: 101 mEq/L (ref 96–112)
Creatinine, Ser: 0.92 mg/dL (ref 0.40–1.50)
GFR: 86.39 mL/min (ref >60.00)
Glucose, Bld: 94 mg/dL (ref 70–99)
Potassium: 4.2 mEq/L (ref 3.5–5.1)
Sodium: 140 mEq/L (ref 135–145)

## 2021-03-27 LAB — FOLATE: Folate: 6.9 ng/mL (ref >5.9)

## 2021-03-27 LAB — PSA: PSA: 4.21 ng/mL — ABNORMAL HIGH (ref 0.10–4.00)

## 2021-03-27 LAB — FERRITIN: Ferritin: 77.5 ng/mL (ref 22.0–322.0)

## 2021-03-27 LAB — VITAMIN B12: Vitamin B-12: 308 pg/mL (ref 211–911)

## 2021-03-27 MED ORDER — SHINGRIX 50 MCG/0.5ML IM SUSR: 0.5000 mL | Freq: Once | INTRAMUSCULAR | 1 refills | Status: AC

## 2021-03-27 MED ORDER — METOPROLOL TARTRATE 50 MG PO TABS
50.0000 mg | ORAL_TABLET | Freq: Two times a day (BID) | ORAL | 0 refills | Status: DC
Start: 2021-03-27 — End: 2021-06-26

## 2021-03-27 MED ORDER — BOOSTRIX 5-2.5-18.5 LF-MCG/0.5 IM SUSP: 0.5000 mL | Freq: Once | INTRAMUSCULAR | 0 refills | Status: AC

## 2021-03-27 NOTE — Progress Notes (Signed)
Subjective:  Patient ID: Dustin Schneider, male    DOB: 1955/12/31  Age: 65 y.o. MRN: [id number]  CC: Hypertension, Atrial Fibrillation, and Hyperlipidemia  This visit occurred during the SARS-CoV-2 public health emergency.  Safety protocols were in place, including screening questions prior to the visit, additional usage of staff PPE, and extensive cleaning of exam room while observing appropriate contact time as indicated for disinfecting solutions.    HPI Dustin Schneider presents for f/up - He is status post CVA related to atrial fibrillation.  He is with a male companion today and she thinks he is doing pretty well.  He has a dense right upper and lower extremity paresis.  Outpatient Medications Prior to Visit  Medication Sig Dispense Refill   apixaban (ELIQUIS) 5 MG TABS tablet Take 1 tablet (5 mg total) by mouth 2 (two) times daily. 60 tablet 6   baclofen (LIORESAL) 20 MG tablet Take 1 tablet (20 mg total) by mouth 3 (three) times daily. 90 tablet 3   diltiazem (CARDIZEM CD) 180 MG 24 hr capsule TAKE (1) CAPSULE DAILY. 30 capsule 6   rosuvastatin (CRESTOR) 10 MG tablet TAKE 1 TABLET BY MOUTH DAILY AFTER SUPPER 30 tablet 6   metoprolol tartrate (LOPRESSOR) 50 MG tablet TAKE 1 TABLET BY MOUTH TWICE DAILY. 180 tablet 2   dofetilide (TIKOSYN) 500 MCG capsule Take 1 capsule (500 mcg total) by mouth 2 (two) times daily. 180 capsule 2   No facility-administered medications prior to visit.    ROS Review of Systems  Constitutional:  Negative for diaphoresis, fatigue and unexpected weight change.  HENT: Negative.    Eyes:  Negative for visual disturbance.  Respiratory:  Negative for cough, chest tightness, shortness of breath and wheezing.   Cardiovascular:  Negative for chest pain, palpitations and leg swelling.  Gastrointestinal:  Negative for abdominal pain, constipation and diarrhea.  Endocrine: Negative.   Genitourinary: Negative.  Negative for difficulty urinating, scrotal  swelling and testicular pain.  Musculoskeletal:  Positive for gait problem.  Skin: Negative.   Neurological:  Positive for weakness. Negative for dizziness and light-headedness.  Hematological:  Negative for adenopathy. Does not bruise/bleed easily.  Psychiatric/Behavioral: Negative.     Objective:  BP 126/78 (BP Location: Left Arm, Patient Position: Sitting, Cuff Size: Large)   Pulse 60   Temp 98.8 F (37.1 C) (Oral)   Resp 16   Ht '5\' 8"'$  (1.727 m)   Wt 154 lb (69.9 kg)   SpO2 95%   BMI 23.42 kg/m   BP Readings from Last 3 Encounters:  03/27/21 126/78  01/30/21 126/81  12/23/20 133/79    Wt Readings from Last 3 Encounters:  03/27/21 154 lb (69.9 kg)  01/30/21 154 lb (69.9 kg)  12/23/20 155 lb 6.4 oz (70.5 kg)    Physical Exam Vitals reviewed.  HENT:     Nose: Nose normal.     Mouth/Throat:     Mouth: Mucous membranes are moist.  Eyes:     Conjunctiva/sclera: Conjunctivae normal.  Cardiovascular:     Rate and Rhythm: Normal rate and regular rhythm.     Heart sounds: No murmur heard. Pulmonary:     Effort: Pulmonary effort is normal.     Breath sounds: No stridor. No wheezing, rhonchi or rales.  Abdominal:     General: Abdomen is flat. Bowel sounds are normal. There is no distension.     Palpations: Abdomen is soft. There is no hepatomegaly, splenomegaly or mass.  Tenderness: There is no abdominal tenderness.  Musculoskeletal:        General: Normal range of motion.     Cervical back: Neck supple.  Lymphadenopathy:     Cervical: No cervical adenopathy.  Skin:    General: Skin is warm and dry.  Neurological:     Mental Status: He is alert. Mental status is at baseline.     Motor: Weakness present.     Coordination: Coordination abnormal.     Gait: Gait abnormal.     Deep Tendon Reflexes: Reflexes abnormal.  Psychiatric:        Mood and Affect: Mood normal.        Behavior: Behavior normal.    Lab Results  Component Value Date   WBC 7.3 03/27/2021    HGB 14.9 03/27/2021   HCT 45.3 03/27/2021   PLT 215.0 03/27/2021   GLUCOSE 94 03/27/2021   CHOL 137 03/27/2021   TRIG 178.0 (H) 03/27/2021   HDL 49.30 03/27/2021   LDLCALC 52 03/27/2021   ALT 31 03/06/2020   AST 22 03/06/2020   NA 140 03/27/2021   K 4.2 03/27/2021   CL 101 03/27/2021   CREATININE 0.92 03/27/2021   BUN 20 03/27/2021   CO2 32 03/27/2021   TSH 2.08 03/27/2021   PSA 4.21 (H) 03/27/2021   INR 1.3 (H) 03/11/2020   HGBA1C 5.6 01/28/2020    No results found.  Assessment & Plan:   Dustin Schneider was seen today for hypertension, atrial fibrillation and hyperlipidemia.  Diagnoses and all orders for this visit:  Need for prophylactic vaccination with combined diphtheria-tetanus-pertussis (DTP) vaccine -     Tdap (BOOSTRIX) 5-2.5-18.5 LF-MCG/0.5 injection; Inject 0.5 mLs into the muscle once for 1 dose.  Paroxysmal atrial fibrillation ([place])- He has good rate and rhythm control.  Will continue anticoagulation with the DOAC. -     metoprolol tartrate (LOPRESSOR) 50 MG tablet; Take 1 tablet (50 mg total) by mouth 2 (two) times daily. -     TSH; Future -     TSH  Need for shingles vaccine -     Zoster Vaccine Adjuvanted [facility]) injection; Inject 0.5 mLs into the muscle once for 1 dose.  Essential hypertension- His blood pressure is adequately well controlled. -     CBC with Differential/Platelet; Future -     Basic metabolic panel; Future -     TSH; Future -     TSH -     Basic metabolic panel -     CBC with Differential/Platelet  Deficiency anemia- His H&H are normal now.  I will screen him for vitamin deficiencies. -     CBC with Differential/Platelet; Future -     Iron; Future -     Vitamin B12; Future -     Folate; Future -     Ferritin; Future -     Vitamin B1; Future -     Vitamin B1 -     Ferritin -     Folate -     Vitamin B12 -     Iron -     CBC with Differential/Platelet  Hyperlipidemia with target LDL less than 100- LDL goal achieved. Doing well  on the statin  -     Lipid panel; Future -     Lipid panel  Prostate cancer screening  Benign prostatic hyperplasia without lower urinary tract symptoms -     PSA; Future -     PSA  Screen for colon cancer -  Cologuard  PSA elevation- This is mildly elevated.  Will recheck in 3 months.  I have changed Daleen Bo. Troiano's metoprolol tartrate. I am also having him start on Shingrix and Boostrix. Additionally, I am having him maintain his dofetilide, apixaban, baclofen, rosuvastatin, and diltiazem.  Meds ordered this encounter  Medications   metoprolol tartrate (LOPRESSOR) 50 MG tablet    Sig: Take 1 tablet (50 mg total) by mouth 2 (two) times daily.    Dispense:  180 tablet    Refill:  0   Zoster Vaccine Adjuvanted [facility]) injection    Sig: Inject 0.5 mLs into the muscle once for 1 dose.    Dispense:  0.5 mL    Refill:  1   Tdap (BOOSTRIX) 5-2.5-18.5 LF-MCG/0.5 injection    Sig: Inject 0.5 mLs into the muscle once for 1 dose.    Dispense:  0.5 mL    Refill:  0     Follow-up: Return in about 3 months (around 06/27/2021).  Scarlette Calico, MD

## 2021-03-27 NOTE — Patient Instructions (Signed)
[employer] medicine (25th ed., pp. 1059-1068). [place], [place]: Elsevier.">  Anemia  Anemia is a condition in which there is not enough red blood cells or hemoglobin in the blood. Hemoglobin is a substance in red blood cells thatcarries oxygen. When you do not have enough red blood cells or hemoglobin (are anemic), your body cannot get enough oxygen and your organs may not work properly. Asa result, you may feel very tired or have other problems. What are the causes? Common causes of anemia include: Excessive bleeding. Anemia can be caused by excessive bleeding inside or outside the body, including bleeding from the intestines or from heavy menstrual periods in females. Poor nutrition. Long-lasting (chronic) kidney, thyroid, and liver disease. Bone marrow disorders, spleen problems, and blood disorders. Cancer and treatments for cancer. HIV (human immunodeficiency virus) and AIDS (acquired immunodeficiency syndrome). Infections, medicines, and autoimmune disorders that destroy red blood cells. What are the signs or symptoms? Symptoms of this condition include: Minor weakness. Dizziness. Headache, or difficulties concentrating and sleeping. Heartbeats that feel irregular or faster than normal (palpitations). Shortness of breath, especially with exercise. Pale skin, lips, and nails, or cold hands and feet. Indigestion and nausea. Symptoms may occur suddenly or develop slowly. If your anemia is mild, you maynot have symptoms. How is this diagnosed? This condition is diagnosed based on blood tests, your medical history, and a physical exam. In some cases, a test may be needed in which cells are removed from the soft tissue inside of a bone and looked at under a microscope (bone marrow biopsy). Your health care provider may also check your stool (feces) for blood and may do additional testing to look for the cause of yourbleeding. Other tests may include: Imaging tests, such as a CT scan or  MRI. A procedure to see inside your esophagus and stomach (endoscopy). A procedure to see inside your colon and rectum (colonoscopy). How is this treated? Treatment for this condition depends on the cause. If you continue to lose a lot of blood, you may need to be treated at a hospital. Treatment may include: Taking supplements of iron, vitamin B12, or folic acid. Taking a hormone medicine (erythropoietin) that can help to stimulate red blood cell growth. Having a blood transfusion. This may be needed if you lose a lot of blood. Making changes to your diet. Having surgery to remove your spleen. Follow these instructions at home: Take over-the-counter and prescription medicines only as told by your health care provider. Take supplements only as told by your health care provider. Follow any diet instructions that you were given by your health care provider. Keep all follow-up visits as told by your health care provider. This is important. Contact a health care provider if: You develop new bleeding anywhere in the body. Get help right away if: You are very weak. You are short of breath. You have pain in your abdomen or chest. You are dizzy or feel faint. You have trouble concentrating. You have bloody stools, black stools, or tarry stools. You vomit repeatedly or you vomit up blood. These symptoms may represent a serious problem that is an emergency. Do not wait to see if the symptoms will go away. Get medical help right away. Call your local emergency services (911 in the [place] Do not drive yourself to the hospital. Summary Anemia is a condition in which you do not have enough red blood cells or enough of a substance in your red blood cells that carries oxygen (hemoglobin). Symptoms may occur suddenly   or develop slowly. If your anemia is mild, you may not have symptoms. This condition is diagnosed with blood tests, a medical history, and a physical exam. Other tests may be  needed. Treatment for this condition depends on the cause of the anemia. This information is not intended to replace advice given to you by your health care provider. Make sure you discuss any questions you have with your healthcare provider. Document Revised: 07/07/2019 Document Reviewed: 07/07/2019 Elsevier Patient Education  2022 [employer].

## 2021-03-28 DIAGNOSIS — R972 Elevated prostate specific antigen [PSA]: Secondary | ICD-10-CM | POA: Insufficient documentation

## 2021-03-28 DIAGNOSIS — Z1211 Encounter for screening for malignant neoplasm of colon: Secondary | ICD-10-CM | POA: Insufficient documentation

## 2021-03-29 ENCOUNTER — Telehealth: Payer: Self-pay | Admitting: Internal Medicine

## 2021-03-29 ENCOUNTER — Ambulatory Visit: Payer: BC Managed Care – PPO | Admitting: Physical Therapy

## 2021-03-29 DIAGNOSIS — G8110 Spastic hemiplegia affecting unspecified side: Secondary | ICD-10-CM

## 2021-03-29 DIAGNOSIS — I63512 Cerebral infarction due to unspecified occlusion or stenosis of left middle cerebral artery: Secondary | ICD-10-CM

## 2021-03-29 NOTE — Telephone Encounter (Signed)
Order has been done and placed on PCPs desk for signature

## 2021-03-29 NOTE — Telephone Encounter (Signed)
    Family requesting  order power lift chair, to avoid paying tax on chair order needed  Chair to be purchased from [facility]. Phone  [phone]

## 2021-03-30 NOTE — Telephone Encounter (Signed)
LVM for Dustin Schneider asking if he would like to pick up, have mailed, or have the order faxed somewhere.

## 2021-03-30 NOTE — Telephone Encounter (Signed)
Order has been faxed

## 2021-03-30 NOTE — Telephone Encounter (Signed)
   Please fax order to [facility] # [phone]

## 2021-03-31 ENCOUNTER — Ambulatory Visit: Payer: BC Managed Care – PPO | Admitting: Physical Therapy

## 2021-04-03 LAB — VITAMIN B1: Vitamin B1 (Thiamine): 12 nmol/L (ref 8–30)

## 2021-04-06 ENCOUNTER — Telehealth: Payer: Self-pay | Admitting: Internal Medicine

## 2021-04-06 NOTE — Telephone Encounter (Signed)
I do not see any result notations from PCP. Please advise once you have reviewed.

## 2021-04-06 NOTE — Telephone Encounter (Signed)
Calling to discuss patient recent lab results  Also wants to discuss possible supplements for iron deficiency  Callback [phone]

## 2021-04-09 LAB — COLOGUARD: Cologuard: NEGATIVE

## 2021-04-18 ENCOUNTER — Encounter: Payer: Medicare Other | Attending: Physical Medicine & Rehabilitation | Admitting: Physical Medicine & Rehabilitation

## 2021-04-18 ENCOUNTER — Encounter: Payer: Self-pay | Admitting: Physical Medicine & Rehabilitation

## 2021-04-18 ENCOUNTER — Other Ambulatory Visit: Payer: Self-pay

## 2021-04-18 VITALS — BP 112/73 | HR 55 | Temp 98.6°F | Ht 68.0 in | Wt 155.4 lb

## 2021-04-18 DIAGNOSIS — R4701 Aphasia: Secondary | ICD-10-CM | POA: Diagnosis present

## 2021-04-18 DIAGNOSIS — G8110 Spastic hemiplegia affecting unspecified side: Secondary | ICD-10-CM | POA: Diagnosis present

## 2021-04-18 DIAGNOSIS — I63512 Cerebral infarction due to unspecified occlusion or stenosis of left middle cerebral artery: Secondary | ICD-10-CM | POA: Diagnosis not present

## 2021-04-18 MED ORDER — BACLOFEN 20 MG PO TABS: 20.0000 mg | ORAL_TABLET | Freq: Three times a day (TID) | ORAL | 2 refills | Status: DC

## 2021-04-18 NOTE — Patient Instructions (Signed)
May try to reduce baclofen to 2 times a day  if spasms worsen then would resume

## 2021-04-18 NOTE — Progress Notes (Signed)
Subjective:    Patient ID: Dustin Schneider, male    DOB: Dec 15, 1955, 65 y.o.   MRN: [id number]  65 y.o. male with history of PAF/on Xarelto, and ICM; who was admitted on 01/27/2020 after collapsing at [facility] hardware. He was found to have right-sided weakness with left gaze preference as well as nausea and vomiting. CT head at admission revealed hyperdense sign left M2 branch compatible with acute thrombus and he underwent cerebral angio with complete recanalization of left-MCA and ACA. 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 He was maintained on aspirin given IR petechial hemorrhage and transition to Eliquis by 06/22. He was kept NPO till discharge due to  signs of dysphagia with risk of aspiration and coretak placed for nutritional support.  HPI Remains aphasic but able to communicate non fluently short sentence/phrase level   No falls  Able to dress himself Needs help with bathing, mainly with shower chair transfers   Pt able to make sandwich but caregiver does most of cooking   Pt doing some squatting exercise 20 reps x 2sets Starting up exercise routine now that caregiver is off leave of absence  Spasticity doing ok per pt, currently taking baclofen 20 mg 3 times daily,  Pain Inventory Average Pain 0 Pain Right Now 0  BOWEL Number of stools per week: 7  Normal  Mobility use a cane ability to climb steps?  yes do you drive?  no  Function retired I need assistance with the following:  bathing, meal prep, household duties, and shopping  Neuro/Psych weakness  Prior Studies Any changes since last visit?  no  Physicians involved in your care Any changes since last visit?  no   Family History  Problem Relation Age of Onset   Glaucoma Mother    Atrial fibrillation Father    Social History   Socioeconomic History   Marital status:  Divorced    Spouse name: Not on file   Number of children: 1   Years of education: Not on file   Highest education level: Not on file  Occupational History   Not on file  Tobacco Use   Smoking status: Never   Smokeless tobacco: Never  Vaping Use   Vaping Use: Never used  Substance and Sexual Activity   Alcohol use: Not Currently   Drug use: No   Sexual activity: Not on file    Comment: DIVORCE  Other Topics Concern   Not on file  Social History Narrative   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 [place] Determinants of Health   Financial Resource Strain: Not on file  Food Insecurity: Not on file  Transportation Needs: Not on file  Physical Activity: Not on file  Stress: Not on file  Social Connections: Not on file   Past Surgical History:  Procedure Laterality Date   BUBBLE STUDY  03/11/2020   Procedure: BUBBLE STUDY;  Surgeon: [name], MD;  Location: [place];  Service: Cardiovascular;;   CARDIOVERSION N/A 05/13/2018   Procedure: CARDIOVERSION;  Surgeon: [name], MD;  Location: [place];  Service: Cardiovascular;  Laterality: N/A;   CARDIOVERSION N/A 06/13/2018   Procedure: CARDIOVERSION;  Surgeon: [name], MD;  Location: [place];  Service: Cardiovascular;  Laterality: N/A;   CARDIOVERSION N/A 06/23/2018   Procedure: CARDIOVERSION;  Surgeon: [name], MD;  Location: [place];  Service:  Cardiovascular;  Laterality: N/A;   CARDIOVERSION N/A 03/11/2020   Procedure: CARDIOVERSION;  Surgeon: [name], MD;  Location: [place];  Service: Cardiovascular;  Laterality: N/A;   excision of actinic keratosis     EYE SURGERY     eye lift   IR CT HEAD LTD  01/27/2020   IR PERCUTANEOUS ART THROMBECTOMY/INFUSION INTRACRANIAL INC DIAG ANGIO  01/27/2020       IR PERCUTANEOUS ART THROMBECTOMY/INFUSION INTRACRANIAL INC DIAG ANGIO  01/27/2020   RADIOLOGY WITH ANESTHESIA N/A 01/27/2020   Procedure: IR WITH ANESTHESIA;  Surgeon:  Radiologist, Medication, MD;  Location: [place];  Service: Radiology;  Laterality: N/A;   TEE WITHOUT CARDIOVERSION N/A 03/11/2020   Procedure: TRANSESOPHAGEAL ECHOCARDIOGRAM (TEE);  Surgeon: [name], MD;  Location: [facility] ENDOSCOPY;  Service: Cardiovascular;  Laterality: N/A;   TONSILLECTOMY     WISDOM TOOTH EXTRACTION     Past Medical History:  Diagnosis Date   Hypertension    NICM (nonischemic cardiomyopathy) ([place]) 06/15/2018   04/23/18: EF 45%, 09/15/18: NOrmal LVEF   Paroxysmal atrial fibrillation ([place]) 06/15/2018   Stroke ([place])    Visit for monitoring Tikosyn therapy 06/12/2018   BP 112/73   Pulse (!) 55   Temp 98.6 F (37 C)   Ht '5\' 8"'$  (1.727 m)   Wt 155 lb 6.4 oz (70.5 kg)   SpO2 94%   BMI 23.63 kg/m   Opioid Risk Score:   Fall Risk Score:  `1  Depression screen PHQ 2/9  Depression screen [facility] 2/9 04/18/2021 06/21/2020 06/09/2020  Decreased Interest 0 0 0  Down, Depressed, Hopeless 0 0 0  PHQ - 2 Score 0 0 0  Some recent data might be hidden    Review of Systems  Constitutional: Negative.   HENT: Negative.    Eyes: Negative.   Respiratory: Negative.    Cardiovascular: Negative.   Gastrointestinal: Negative.   Endocrine: Negative.   Genitourinary: Negative.   Musculoskeletal:  Positive for gait problem.  Skin: Negative.   Allergic/Immunologic: Negative.   Neurological:  Positive for weakness.  Hematological:  Bruises/bleeds easily.       Eliquis  Psychiatric/Behavioral: Negative.    All other systems reviewed and are negative.     Objective:   Physical Exam Vitals and nursing note reviewed.  Constitutional:      Appearance: He is normal weight.  HENT:     Head: Normocephalic and atraumatic.  Eyes:     Extraocular Movements: Extraocular movements intact.     Conjunctiva/sclera: Conjunctivae normal.     Pupils: Pupils are equal, round, and reactive to light.  Abdominal:     General: There is no distension.     Palpations: There is no mass.   Musculoskeletal:        General: No tenderness.  Neurological:     Mental Status: He is alert.   Motor strength is 3/5 in the right deltoid bicep tricep grip 5/5 in left deltoid bicep tricep grip 3/5 right hip flexor 4/5 knee extensors 0/5 ankle dorsiflexor Tone [name] 2 in the finger and wrist flexors [name] 3 in the foot inverters Hyperactive Babinski right lower extremity Ambulates with hinged plastic AFO semisolid as well as cane no evidence of toe drag or knee instability Patient is able to name simple objects such as glasses and pen.  His speech is nonfluent at phrase level.      Assessment & Plan:  1.  Left MCA distribution infarct with residual right hemiparesis and aphasia.  His  tone appears to be well controlled at the current time.  His right foot inversion is well controlled by his AFO.

## 2021-05-24 IMAGING — XA IR PERCUTANEOUS ART THORMBECTOMY/INFUSION INTRACRANIAL INCLUDE D
1 series · 16 of 24 positions shown · non-contrast
Comparison: CT/CT angiogram of the head and neck January 27, 2020.

INDICATION: 63-year-old male with past medical history significant for arcs is
small atrial fibrillation on Xarelto, nonischemic cardiomyopathy and
hypertension presenting with left course gaze and right hemiplegia,
NIHSS 19. He was last seen well at [DATE] on 01/27/2020. Premorbid
modified Precious Presh Athalia 0. Head CT showed no evidence of a large
territorial infarct. CT angiogram of the head and neck showed a
distal left M1/MCA occlusion and a left A2-A3/ACA occlusion no
intravenous tPA given. He was taken to our service for emergency
mechanical thrombectomy.

EXAM:
Diagnostic cerebral angiogram and mechanical thrombectomy.
TECHNIQUE: Informed written consent was obtained from the patient's next of [place]
after a thorough discussion of the procedural risks, benefits and
alternatives. All questions were addressed. Maximal Sterile Barrier
Technique was utilized including caps, mask, sterile gowns, sterile
gloves, sterile drape, hand hygiene and skin antiseptic. A timeout
was performed prior to the initiation of the procedure.

[Series 10: <mpr range>2 · axial · 2.0mm · 0.32mm/px · z∈[-183,+26]mm · 16 of 35 slices shown]
[im 1/35]
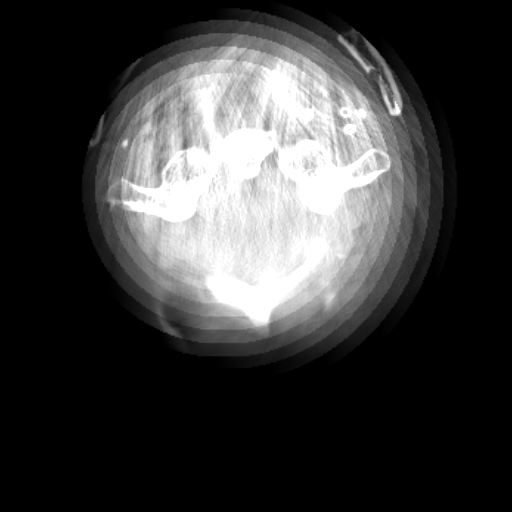
[im 3/35]
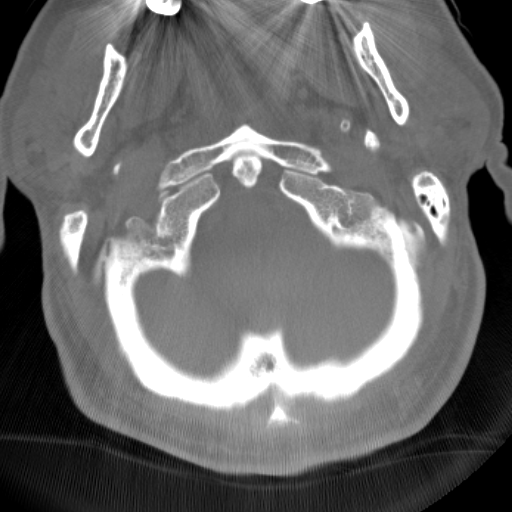
[im 5/35]
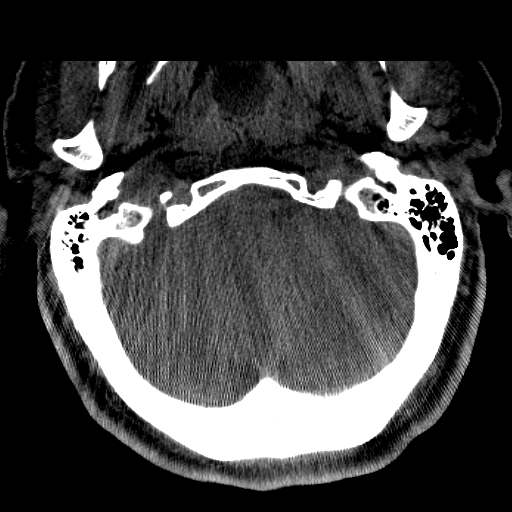
[im 8/35]
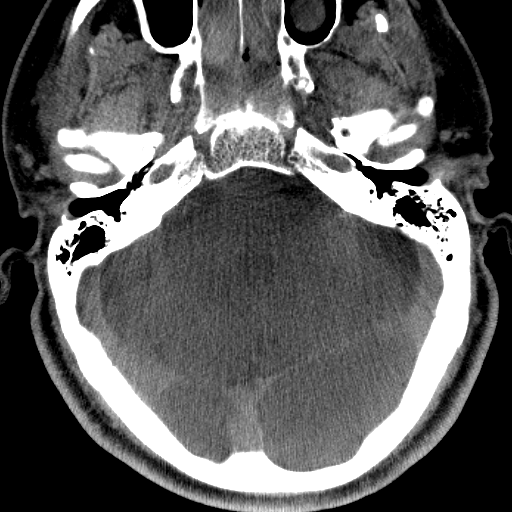
[im 9/35]
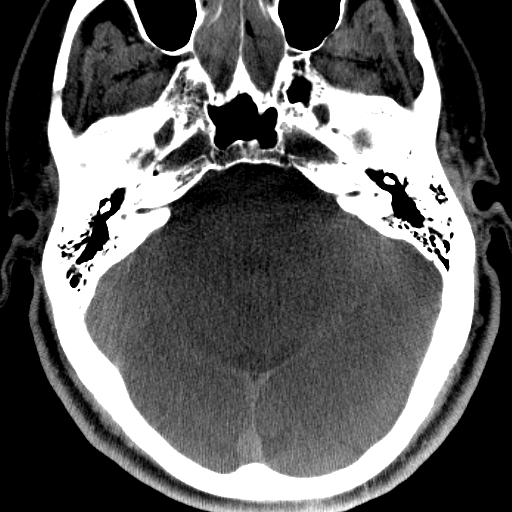
[im 12/35]
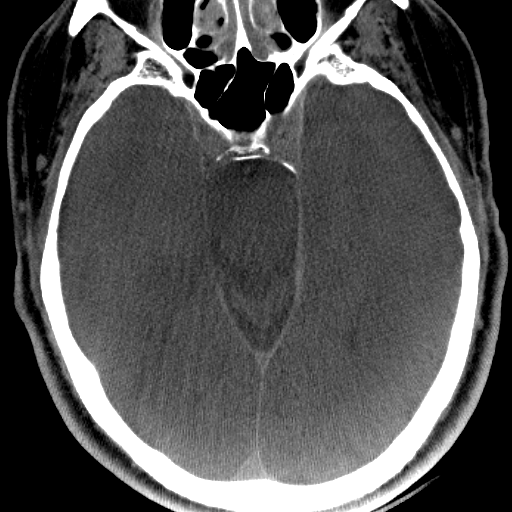
[im 14/35]
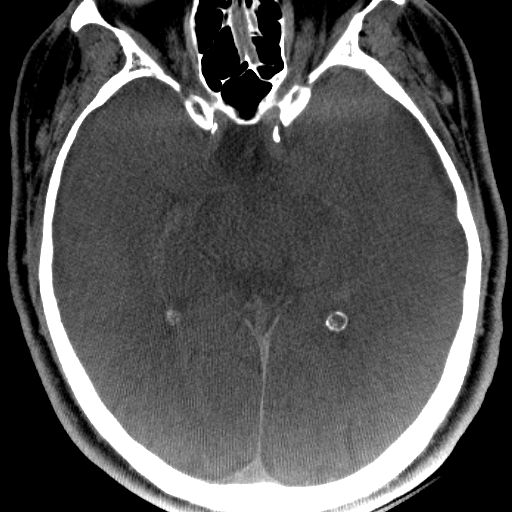
[im 17/35]
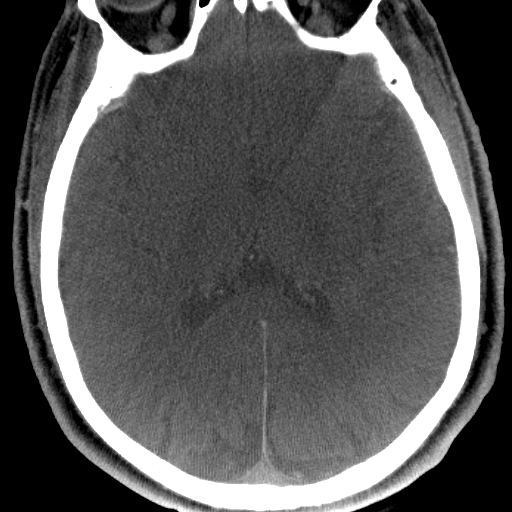
[im 18/35]
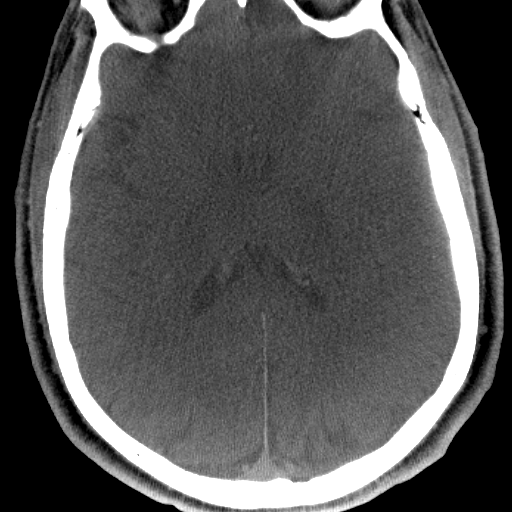
[im 21/35]
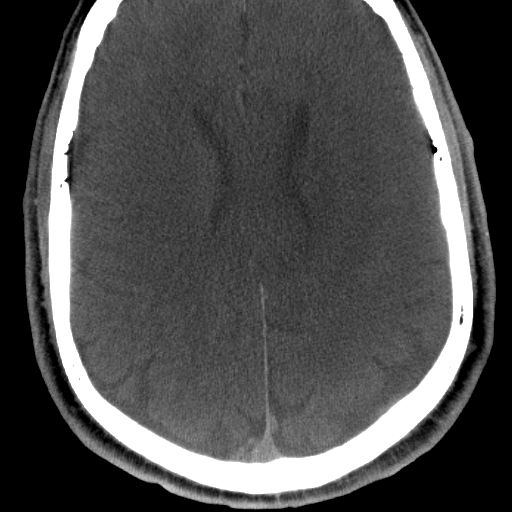
[im 23/35]
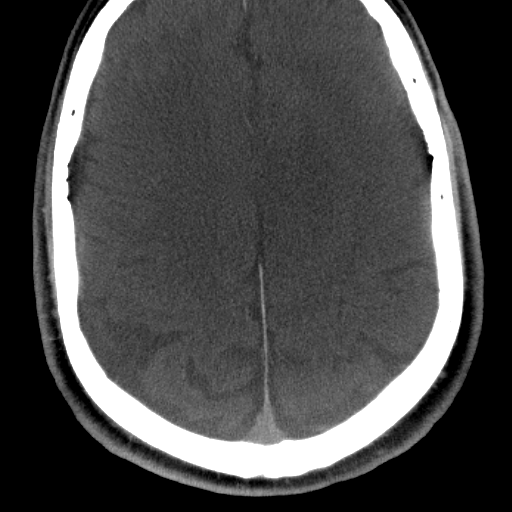
[im 26/35]
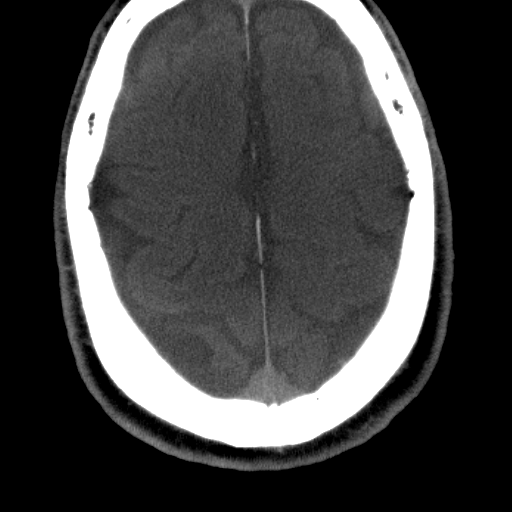
[im 27/35]
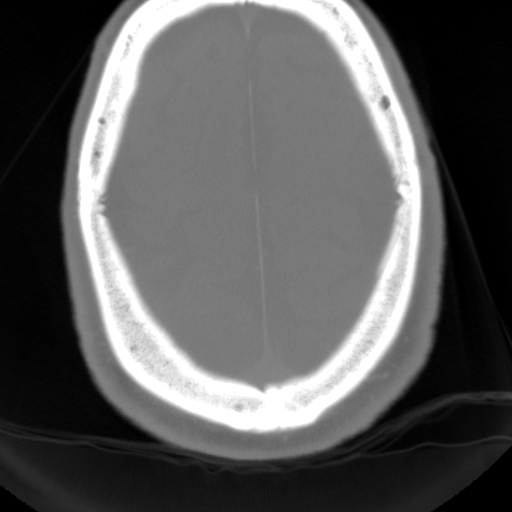
[im 30/35]
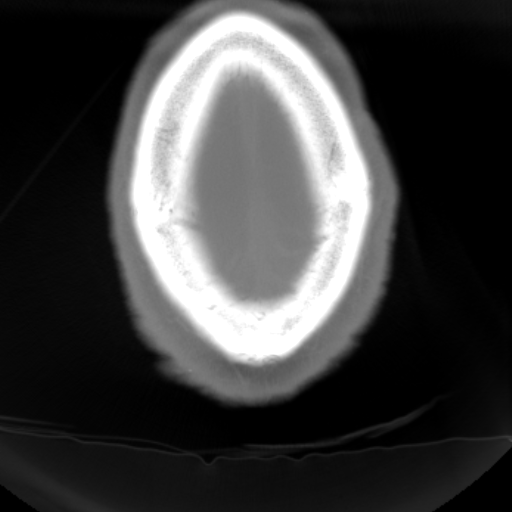
[im 32/35]
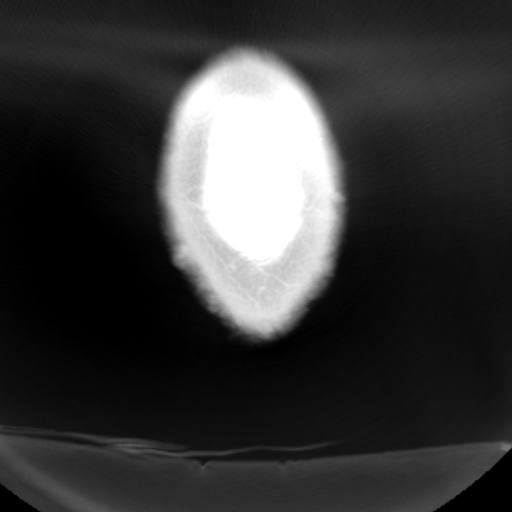
[im 35/35]
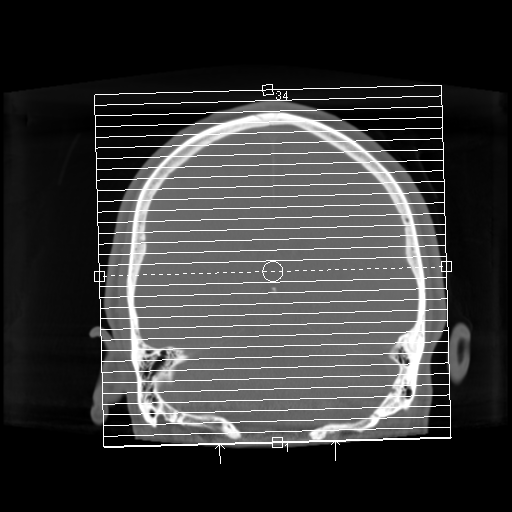

[16 of 24 positions shown; findings below may reference images not displayed]

MEDICATIONS:
No antibiotics given.

ANESTHESIA/SEDATION:
The procedure was performed under general anesthesia.

FLUOROSCOPY TIME:  Fluoroscopy Time: 46 minutes 36 seconds (781
mGy).

COMPLICATIONS:
None immediate.
The right groin was prepped and draped in the usual sterile fashion.
Using a micropuncture kit and the modified Seldinger technique,
access was gained to the right common femoral artery and an 8 French
sheath was placed.

Under fluoroscopy, a Zoom 88 guide catheter was navigated over a 6
Giorgi Jumper 2 catheter and a 0.035 inch Terumo Glidewire into
the aortic arch. The catheter was placed into the left common
carotid artery and then advanced into the left internal carotid
artery. The inner catheter was removed. Frontal and lateral
angiograms of the head were obtained.
FINDINGS: 1. Occlusion of the distal left M1/MCA.
2. Occlusion of the left pericallosal artery at the A3 segment.

PROCEDURE:
Under biplane roadmap, a Zoom 71 aspiration catheter was navigated
over a synchro support microguidewire 2 into the left M1/MCA. The
wire was removed. Aspiration catheter was connected to an aspiration
and advanced to the occlusion level. Continuous aspiration was
performed for 4 minutes. The aspiration catheter was then removed
under constant aspiration. The guiding catheter was aspirated and
brisk blood return was seen. Left ICA angiograms with frontal and
lateral views of the head showed complete recanalization of the left
MCA (IEQEZ).

Under biplane roadmap, is some 35 aspiration catheter was navigated
over and Aristotle 14 micro guidewire into the left pericallosal
artery at the level of occlusion. Continuous aspiration was
performed for 3 minutes. The aspiration catheter was then removed
under constant aspiration. The guiding catheter was aspirated and
brisk blood return was seen. Left ICA angiograms with frontal and
lateral views of the head showed persistent occlusion of the left
pericallosal artery.

In a similar fashion, a 2nd aspiration pass was performed in the
left pericallosal artery. Left ICA angiograms with frontal and
lateral views of the head showed persistent occlusion of the left
pericallosal artery.

Under biplane roadmap, a Zoom 71 aspiration catheter was navigated
over a phenom 21 microcatheter and a Aristotle 14 microguidewire
into the cavernous segment of the left ICA. The microcatheter was
then navigated over the wire into the left A3/ACA. Then, a 3 mm
solitaire stent retriever was deployed spanning the A3 segment. The
device was allowed to intercalated with the clot for 4 minutes. The
microcatheter was removed. The aspiration catheter was connected to
a penumbra aspiration pump. The thrombectomy device and aspiration
catheter were removed under constant aspiration.

Follow-up left ICA angiogram showed complete recanalization of the
left ACA (TICI 3).

Flat panel CT of the head was obtained and post processed in a
separate workstation with concurrent attending physician
supervision. Selected images were sent to PACS. No evidence of
hemorrhagic complication.

Distantly 88 catheter was retracted to the level of the left common
carotid artery. Frontal and lateral angiograms of the neck were
obtained. Increased tortuosity of the cervical left ICA was noted.
Minimal atherosclerotic changes seen in the left carotid bulb. No
stenosis.
IMPRESSION: 1. Successful and uncomplicated mechanical thrombectomy for
treatment of a distal left M1/MCA with direct contact aspiration.
One is pass performed with complete recanalization (TICI 3).
2. Successful and uncomplicated mechanical thrombectomy for
treatment of a left A3/ACA occlusion. Total of 2 direct contact
aspiration passes and 1 stent retriever pass with complete
recanalization (TICI 3).
3. No embolus to distal or new territory.
4. No hemorrhagic complication on postprocedural flat panel CT.

PLAN:
- Patient will remain intubated due to concerns for aspiration.

- SBP 120-140.- Bed rest 6 hours.- F/U MRI within 24h.

## 2021-05-24 IMAGING — DX DG CHEST 1V PORT
1 series · 1 of 1 positions shown · non-contrast
Comparison: None.

CLINICAL DATA: Stroke, intubated

EXAM:
PORTABLE CHEST 1 VIEW

[chest ap]
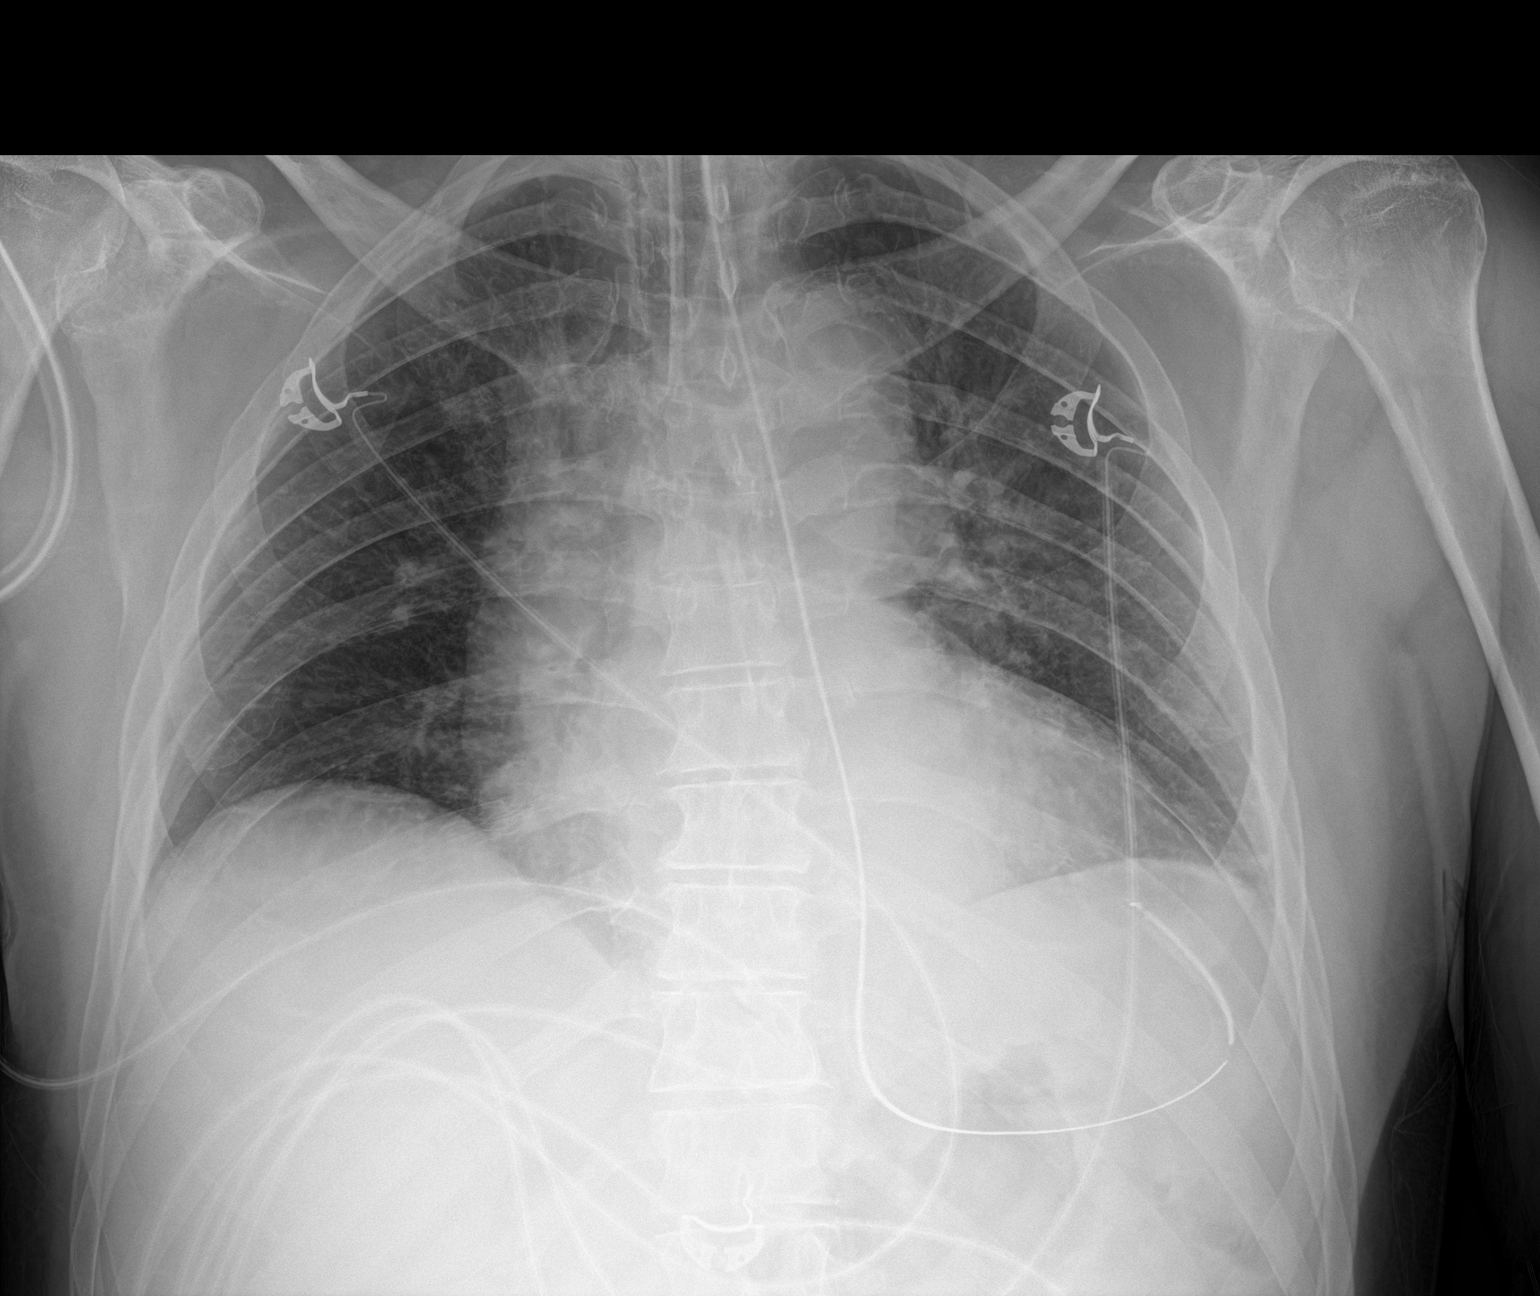

[1 of 1 positions shown; findings below may reference images not displayed]

FINDINGS: Single frontal view of the chest demonstrates unremarkable cardiac
silhouette. Ectasia of the thoracic aorta. Endotracheal tube
overlies tracheal air column tip well above carina. Enteric catheter
passes below diaphragm tip and side port project over gastric
fundus. No airspace disease, effusion, or pneumothorax. No acute
bony abnormalities.
IMPRESSION: 1. Support devices as above.
2. No acute intrathoracic process.

## 2021-05-27 IMAGING — DX DG CHEST 1V PORT
1 series · 1 of 1 positions shown · non-contrast
Comparison: January 27, 2020

CLINICAL DATA: Re-evaluate ETT.

EXAM:
PORTABLE CHEST 1 VIEW

[chest]
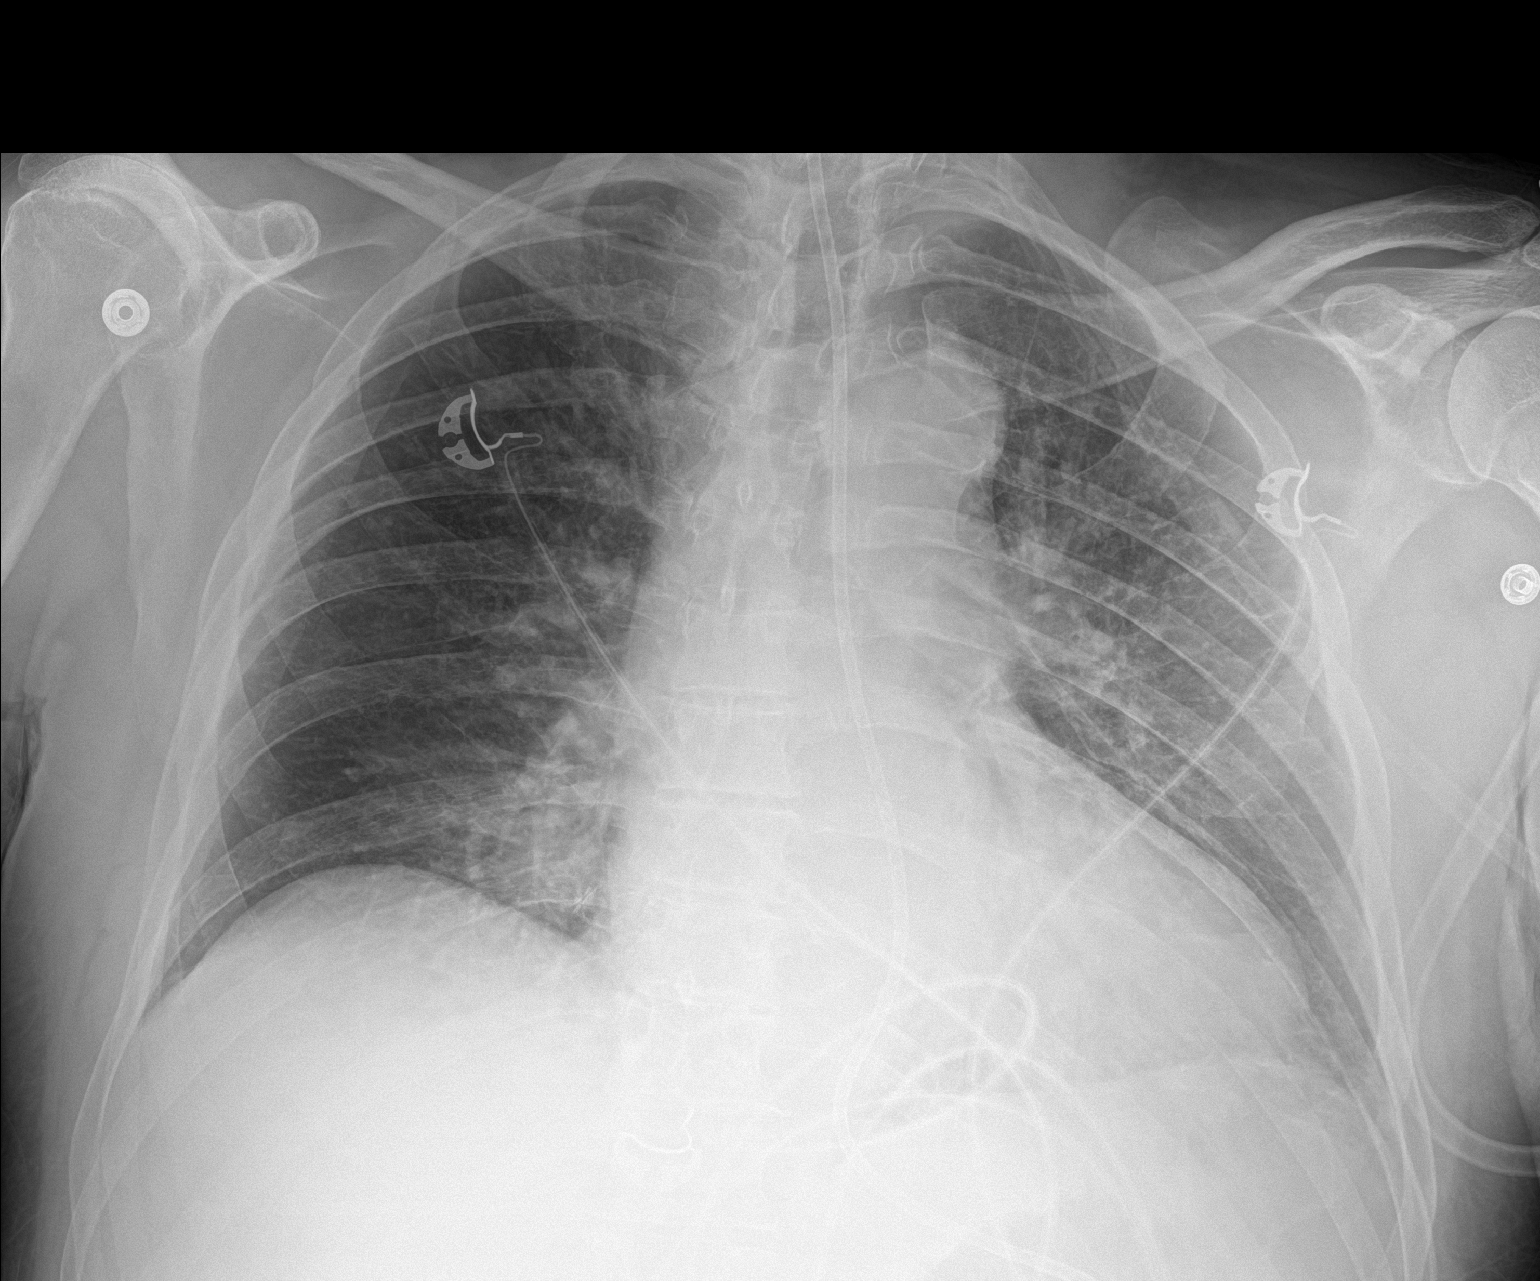

[1 of 1 positions shown; findings below may reference images not displayed]

FINDINGS: The ET tube is no longer visualized. The feeding tube terminates
below today's film. No pneumothorax. Vascular crowding in the medial
right lung base. Mild haziness over the left chest may be due to
patient rotation. No focal infiltrates are noted.
IMPRESSION: 1. The purpose of the study is to evaluate for an ETT. No ET tube
identified.
2. The feeding tube terminates below today's film.
3. No other acute abnormalities.

These results will be called to the ordering clinician or
representative by the Radiologist Assistant, and communication
documented in the PACS or [REDACTED].

## 2021-06-19 ENCOUNTER — Encounter: Payer: Self-pay | Admitting: Internal Medicine

## 2021-06-20 ENCOUNTER — Other Ambulatory Visit: Payer: Self-pay | Admitting: Cardiology

## 2021-06-21 NOTE — Telephone Encounter (Signed)
Can I refill?

## 2021-06-26 ENCOUNTER — Other Ambulatory Visit: Payer: Self-pay

## 2021-06-26 ENCOUNTER — Ambulatory Visit: Payer: Medicare Other | Admitting: Student

## 2021-06-26 ENCOUNTER — Encounter: Payer: Self-pay | Admitting: Student

## 2021-06-26 VITALS — BP 139/76 | HR 47 | Temp 98.0°F | Ht 68.0 in | Wt 161.0 lb

## 2021-06-26 DIAGNOSIS — I480 Paroxysmal atrial fibrillation: Secondary | ICD-10-CM

## 2021-06-26 DIAGNOSIS — I10 Essential (primary) hypertension: Secondary | ICD-10-CM

## 2021-06-26 DIAGNOSIS — E7800 Pure hypercholesterolemia, unspecified: Secondary | ICD-10-CM

## 2021-06-26 MED ORDER — METOPROLOL TARTRATE 50 MG PO TABS: 25.0000 mg | ORAL_TABLET | Freq: Two times a day (BID) | ORAL | 0 refills | Status: DC

## 2021-06-26 NOTE — Progress Notes (Signed)
Primary Physician/Referring:  [name], MD  Patient ID: Dustin Schneider, male    DOB: [birth year], 65 y.o.   MRN: [id number]  Chief Complaint  Patient presents with   Atrial Fibrillation   Follow-up   HPI:    Dustin Schneider  is a 65 y.o.  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 He was restarted on Tikosyn during his last hospitalization and also underwent TEE cardioversion.    Patient presents for 39-month follow-up of paroxysmal atrial fibrillation, hyperlipidemia, and chronic diastolic heart failure.  Since last office visit patient underwent lipid profile testing with PCP.  LDL is well controlled at 52, however triglycerides elevated at 178.  Patient CBC and BMP within normal limits and 03/2021.  Patient reports continued medication compliance and remains asymptomatic.  Denies chest pain, palpitations, syncope, near syncope, dizziness.  Reports no known recurrence of atrial fibrillation.  He is tolerating anticoagulation without bleeding diathesis.   Past Medical History:  Diagnosis Date   Hypertension    NICM (nonischemic cardiomyopathy) ([place]) 06/15/2018   04/23/18: EF 45%, 09/15/18: NOrmal LVEF   Paroxysmal atrial fibrillation ([place]) 06/15/2018   Stroke ([place])    Visit for monitoring Tikosyn therapy 06/12/2018   Past Surgical History:  Procedure Laterality Date   BUBBLE STUDY  03/11/2020   Procedure: BUBBLE STUDY;  Surgeon: [name], MD;  Location: [place];  Service: Cardiovascular;;   CARDIOVERSION N/A 05/13/2018   Procedure: CARDIOVERSION;  Surgeon: [name], MD;  Location: [place];  Service:  Cardiovascular;  Laterality: N/A;   CARDIOVERSION N/A 06/13/2018   Procedure: CARDIOVERSION;  Surgeon: [name], MD;  Location: [facility] ENDOSCOPY;  Service: Cardiovascular;  Laterality: N/A;   CARDIOVERSION N/A 06/23/2018   Procedure: CARDIOVERSION;  Surgeon: [name], MD;  Location: [place];  Service: Cardiovascular;  Laterality: N/A;   CARDIOVERSION N/A 03/11/2020   Procedure: CARDIOVERSION;  Surgeon: [name], MD;  Location: [place];  Service: Cardiovascular;  Laterality: N/A;   excision of actinic keratosis     EYE SURGERY     eye lift   IR CT HEAD LTD  01/27/2020   IR PERCUTANEOUS ART THROMBECTOMY/INFUSION INTRACRANIAL INC DIAG ANGIO  01/27/2020       IR PERCUTANEOUS ART THROMBECTOMY/INFUSION INTRACRANIAL INC DIAG ANGIO  01/27/2020   RADIOLOGY WITH ANESTHESIA N/A 01/27/2020   Procedure: IR WITH ANESTHESIA;  Surgeon: Radiologist, Medication, MD;  Location: [place];  Service: Radiology;  Laterality: N/A;   TEE WITHOUT CARDIOVERSION N/A 03/11/2020   Procedure: TRANSESOPHAGEAL ECHOCARDIOGRAM (TEE);  Surgeon: [name], MD;  Location: [facility] ENDOSCOPY;  Service: Cardiovascular;  Laterality: N/A;   TONSILLECTOMY     WISDOM TOOTH EXTRACTION     Family History  Problem Relation Age of Onset   Glaucoma Mother    Atrial fibrillation Father     Social History   Tobacco Use   Smoking status: Never   Smokeless tobacco: Never  Substance Use Topics   Alcohol use: Not Currently   Marital Status: Divorced  ROS  Review of Systems  Constitutional:  Negative for malaise/fatigue and weight loss.  Respiratory:  Negative for cough, hemoptysis and shortness of breath.   Cardiovascular:  Negative for chest pain, palpitations,  claudication and leg swelling.  Gastrointestinal:  Negative for blood in stool, heartburn and melena.  Genitourinary:  Negative for dysuria.  Musculoskeletal:  Negative for joint pain and myalgias.  Neurological:  Positive for speech change, focal weakness and  weakness. Negative for dizziness, seizures and headaches.  Endo/Heme/Allergies:  Does not bruise/bleed easily.  Psychiatric/Behavioral:  Negative for depression. The patient is not nervous/anxious.   All other systems reviewed and are negative. Objective  Blood pressure 139/76, pulse (!) 47, temperature 98 F (36.7 C), height 5\' 8"  (1.727 m), weight 161 lb (73 kg), SpO2 97 %.  Vitals with BMI 06/26/2021 04/18/2021 03/27/2021  Height 5\' 8"  5\' 8"  5\' 8"   Weight 161 lbs 155 lbs 6 oz 154 lbs  BMI 24.49 69.67 89.38  Systolic 101 751 025  Diastolic 76 73 78  Pulse 47 55 60   Physical Exam Vitals reviewed.  HENT:     Head: Normocephalic and atraumatic.  Cardiovascular:     Rate and Rhythm: Regular rhythm. Bradycardia present.     Pulses: Intact distal pulses.     Heart sounds: Normal heart sounds, S1 normal and S2 normal. No murmur heard.   No gallop.     Comments: No leg edema, no JVD. Pulmonary:     Effort: Pulmonary effort is normal. No respiratory distress.     Breath sounds: Normal breath sounds. No wheezing, rhonchi or rales.  Musculoskeletal:     Right lower leg: No edema.     Left lower leg: No edema.  Neurological:     Gait: Gait abnormal (right spastic hemiplegia).    Laboratory examination:   Recent Labs    03/27/21 1400  NA 140  K 4.2  CL 101  CO2 32  GLUCOSE 94  BUN 20  CREATININE 0.92  CALCIUM 9.7   CrCl cannot be calculated (Patient's most recent lab result is older than the maximum 21 days allowed.).  CMP Latest Ref Rng & Units 03/27/2021 03/11/2020 03/10/2020  Glucose 70 - 99 mg/dL 94 111(H) 107(H)  BUN 6 - 23 mg/dL 20 12 13   Creatinine 0.40 - 1.50 mg/dL 0.92 0.85 0.90  Sodium 135 - 145 mEq/L 140 139 137  Potassium 3.5 - 5.1 mEq/L 4.2 4.1 4.5  Chloride 96 - 112 mEq/L 101 103 104  CO2 19 - 32 mEq/L 32 26 24  Calcium 8.4 - 10.5 mg/dL 9.7 8.6(L) 8.4(L)  Total Protein 6.5 - 8.1 g/dL - - -  Total Bilirubin 0.3 - 1.2 mg/dL - - -  Alkaline Phos 38 - 126 U/L - -  -  AST 15 - 41 U/L - - -  ALT 0 - 44 U/L - - -   CBC Latest Ref Rng & Units 03/27/2021 03/09/2020 03/07/2020  WBC 4.0 - 10.5 K/uL 7.3 5.7 9.2  Hemoglobin 13.0 - 17.0 g/dL 14.9 11.3(L) 11.3(L)  Hematocrit 39.0 - 52.0 % 45.3 34.7(L) 34.8(L)  Platelets 150.0 - 400.0 K/uL 215.0 234 180   Lipid Panel Recent Labs    03/27/21 1400  CHOL 137  TRIG 178.0*  LDLCALC 52  VLDL 35.6  HDL 49.30  CHOLHDL 3    HEMOGLOBIN A1C Lab Results  Component Value Date   HGBA1C 5.6 01/28/2020   MPG 114 01/28/2020     TSH Recent Labs    03/27/21 1400  TSH 2.08   Allergies  No Known Allergies    Medications Prior to Visit:   Outpatient Medications Prior to Visit  Medication Sig Dispense Refill  apixaban (ELIQUIS) 5 MG TABS tablet Take 1 tablet (5 mg total) by mouth 2 (two) times daily. 60 tablet 6   baclofen (LIORESAL) 20 MG tablet Take 1 tablet (20 mg total) by mouth 3 (three) times daily. 60 tablet 2   diltiazem (CARDIZEM CD) 180 MG 24 hr capsule TAKE (1) CAPSULE DAILY. 30 capsule 6   dofetilide (TIKOSYN) 500 MCG capsule TAKE 1 CAPSULE(500 MCG) BY MOUTH TWICE DAILY 180 capsule 2   rosuvastatin (CRESTOR) 10 MG tablet TAKE 1 TABLET BY MOUTH DAILY AFTER SUPPER 30 tablet 6   metoprolol tartrate (LOPRESSOR) 50 MG tablet Take 1 tablet (50 mg total) by mouth 2 (two) times daily. 180 tablet 0   No facility-administered medications prior to visit.   Final Medications at End of Visit    Current Meds  Medication Sig   apixaban (ELIQUIS) 5 MG TABS tablet Take 1 tablet (5 mg total) by mouth 2 (two) times daily.   baclofen (LIORESAL) 20 MG tablet Take 1 tablet (20 mg total) by mouth 3 (three) times daily.   diltiazem (CARDIZEM CD) 180 MG 24 hr capsule TAKE (1) CAPSULE DAILY.   dofetilide (TIKOSYN) 500 MCG capsule TAKE 1 CAPSULE(500 MCG) BY MOUTH TWICE DAILY   rosuvastatin (CRESTOR) 10 MG tablet TAKE 1 TABLET BY MOUTH DAILY AFTER SUPPER   [DISCONTINUED] metoprolol tartrate (LOPRESSOR) 50 MG tablet  Take 1 tablet (50 mg total) by mouth 2 (two) times daily.    Radiology:   No results found.  Cardiac Studies:   Nuclear stress test  05/05/2018: 1. The resting electrocardiogram demonstrated atrial fibrillation. The stress electrocardiogram was positive for ischemia with 2 mm downsloping ST depression in the inferior and lateral leads, persisted for 2 mintues into recovery. Stress symptoms included palpitations and fatigue. Patient exercised on Bruce protocol for 6:00 minutes and achieved 7.05 METS. Stress test terminated due to fatigue and 113% MPHR achieved (Target HR >85%). 2. Left ventricular cavity is noted to be normal on the rest and stress studies. SPECT images demonstrate homogeneous tracer distribution throughout the myocardium. The left ventricular ejection fraction was calculated to be 38%, but visually appears to be at least low normal without wall motion abnormality. This is an intermediate risk study due to abnormal ST change and low LVEF (difficult in view of gating with A. Fib), clinical correlation recommended.  Echocardiogram 01/28/2020:  1. The left ventricle has normal function. The left ventricle has no regional wall motion abnormalities. Left ventricular diastolic parameters are  consistent with Grade II diastolic dysfunction (pseudonormalization).   2. Right ventricular systolic function is normal. The right ventricular size is normal. There is mildly elevated pulmonary artery systolic pressure.   3. Left atrial size was moderately dilated.   4. Right atrial size was moderately dilated.   5. The mitral valve is normal in structure. Mild mitral valve regurgitation.   6. The aortic valve is normal in structure. Aortic valve regurgitation is mild to moderate.   7. The inferior vena cava is dilated in size with >50% respiratory variability, suggesting right atrial pressure of 8 mmHg.  TEE Echocardiogram 03/11/2020:   1. Left ventricular ejection fraction, by estimation, is  60 to 65%. The left ventricle has normal function. The left ventricle has no regional wall motion abnormalities.   2. Right ventricular systolic function is normal. The right ventricular size is normal.   3. No left atrial/left atrial appendage thrombus was detected.   4. The mitral valve is normal in structure. Mild mitral  valve regurgitation.   5. The aortic valve is tricuspid. Aortic valve regurgitation is mild.   6. Agitated saline contrast bubble study was negative, with no evidence of any interatrial shunt.   EKG  06/26/2021: Marked sinus bradycardia at a rate of 48 bpm.  Normal axis.  Nonspecific T wave abnormality.  EKG 12/23/2020: Sinus bradycardia rate of 56 bpm.  Normal axis.  Nonspecific T wave abnormality.  No evidence of ischemia or underlying injury pattern.  EKG 06/22/2020: Sinus bradycardia at rate of 50 bpm, normal axis, otherwise normal EKG.  No significant change from 03/23/2019: Marked sinus bradycardia with rate of 54 bpm.  Assessment     ICD-10-CM   1. Paroxysmal atrial fibrillation (HCC)  I48.0 EKG 12-Lead    metoprolol tartrate (LOPRESSOR) 50 MG tablet    2. Pure hypercholesterolemia  E78.00     3. Essential hypertension  I10        Meds ordered this encounter  Medications   metoprolol tartrate (LOPRESSOR) 50 MG tablet    Sig: Take 0.5 tablets (25 mg total) by mouth 2 (two) times daily.    Dispense:  180 tablet    Refill:  0    Medications Discontinued During This Encounter  Medication Reason   metoprolol tartrate (LOPRESSOR) 50 MG tablet Reorder     This patients CHA2DS2-VASc Score 4 (Age, stroke, HTN) and yearly risk of stroke 4.8%.   Recommendations:   Dustin Schneider  is a 65 y.o. 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 He was restarted on Tikosyn during his last hospitalization and also underwent TEE cardioversion.    Patient presents for 25-month follow-up of paroxysmal atrial fibrillation, hyperlipidemia, and chronic diastolic heart failure.  Since last office visit patient underwent lipid profile testing with PCP.  LDL is well controlled at 52, however triglycerides elevated at 178.  Patient CBC and BMP within normal limits and 03/2021.  Discussed at length with patient regarding diet and lifestyle modifications to reduce triglycerides.  Patient admits to dietary noncompliance, appears motivated to make changes.  Patient remains compliant with medications and has had no known recurrence of atrial fibrillation.  He continues to tolerate anticoagulation without bleeding diathesis, will continue this.  He continues to maintain sinus rhythm with normal QT interval.  However patient's EKG today shows marked bradycardia, will therefore reduce Lopressor from 50 mg to 25 mg p.o. twice daily.  We will continue Tikosyn.  Patient is otherwise stable from a cardiovascular standpoint.  Follow-up in 6 months, sooner if needed, for paroxysmal atrial fibrillation, hyperlipidemia, and chronic diastolic heart failure.   Alethia Berthold, PA-C 06/26/2021, 3:52 PM Office: [phone]

## 2021-07-18 ENCOUNTER — Other Ambulatory Visit: Payer: Self-pay

## 2021-07-18 ENCOUNTER — Encounter: Payer: Self-pay | Admitting: Physical Medicine & Rehabilitation

## 2021-07-18 ENCOUNTER — Encounter: Payer: Medicare Other | Attending: Physical Medicine & Rehabilitation | Admitting: Physical Medicine & Rehabilitation

## 2021-07-18 VITALS — BP 126/82 | HR 61 | Temp 98.3°F | Wt 161.0 lb

## 2021-07-18 DIAGNOSIS — I63512 Cerebral infarction due to unspecified occlusion or stenosis of left middle cerebral artery: Secondary | ICD-10-CM | POA: Diagnosis not present

## 2021-07-18 DIAGNOSIS — G8110 Spastic hemiplegia affecting unspecified side: Secondary | ICD-10-CM | POA: Insufficient documentation

## 2021-07-18 NOTE — Patient Instructions (Signed)
Wait until Jan for PT, insurance purposes

## 2021-07-18 NOTE — Progress Notes (Signed)
Subjective:    Patient ID: Dustin Schneider, male    DOB: Apr 20, 1956, 65 y.o.   MRN: [id number] Male with history of PAF/on Xarelto, and ICM; who was admitted on 01/27/2020 after collapsing at [facility] hardware. He was found to have right-sided weakness with left gaze preference as well as nausea and vomiting. CT head at admission revealed hyperdense sign left M2 branch compatible with acute thrombus and he underwent cerebral angio with complete recanalization of left-MCA and ACA. 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 He was maintained on aspirin given IR petechial hemorrhage and transition to Eliquis by 06/22. He was kept NPO till discharge due to  signs of dysphagia with risk of aspiration and coretak placed for nutritional support HPI Remains severely aphasic  Last PT in August- was doing aquatic therapy at [employer] reducing baclofen to twice a day but noted tremors   Morning streches and sit ups, partial squats at [employer] several times a week ten minutes at a time  No falls  Mod I dressing , Needs assist for bathing  Pain Inventory Average Pain 0 Pain Right Now 0 My pain is  no pain  In the last 24 hours, has pain interfered with the following? General activity 0 Relation with others 0 Enjoyment of life 0 What TIME of day is your pain at its worst? varies Sleep (in general) Good  Pain is worse with: unsure Pain improves with: n/a Relief from Meds: n/a  Family History  Problem Relation Age of Onset   Glaucoma Mother    Atrial fibrillation Father    Social History   Socioeconomic History   Marital status: Divorced    Spouse name: Not on file   Number of children: 1   Years of education: Not on file   Highest education level: Not on file  Occupational History   Not on file  Tobacco Use   Smoking status: Never    Smokeless tobacco: Never  Vaping Use   Vaping Use: Never used  Substance and Sexual Activity   Alcohol use: Not Currently   Drug use: No   Sexual activity: Not on file    Comment: DIVORCE  Other Topics Concern   Not on file  Social History Narrative   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 [place] Determinants of Health   Financial Resource Strain: Not on file  Food Insecurity: Not on file  Transportation Needs: Not on file  Physical Activity: Not on file  Stress: Not on file  Social Connections: Not on file   Past Surgical History:  Procedure Laterality Date   BUBBLE STUDY  03/11/2020   Procedure: BUBBLE STUDY;  Surgeon: [name], MD;  Location: [place];  Service: Cardiovascular;;   CARDIOVERSION N/A 05/13/2018   Procedure: CARDIOVERSION;  Surgeon: [name], MD;  Location: [facility] ENDOSCOPY;  Service: Cardiovascular;  Laterality: N/A;   CARDIOVERSION N/A 06/13/2018   Procedure: CARDIOVERSION;  Surgeon: [name], MD;  Location: [facility] ENDOSCOPY;  Service: Cardiovascular;  Laterality: N/A;   CARDIOVERSION N/A 06/23/2018   Procedure: CARDIOVERSION;  Surgeon: [name], MD;  Location: [place];  Service: Cardiovascular;  Laterality: N/A;   CARDIOVERSION N/A 03/11/2020   Procedure: CARDIOVERSION;  Surgeon: [name], MD;  Location: [facility] ENDOSCOPY;  Service: Cardiovascular;  Laterality: N/A;   excision of actinic keratosis  EYE SURGERY     eye lift   IR CT HEAD LTD  01/27/2020   IR PERCUTANEOUS ART THROMBECTOMY/INFUSION INTRACRANIAL INC DIAG ANGIO  01/27/2020       IR PERCUTANEOUS ART THROMBECTOMY/INFUSION INTRACRANIAL INC DIAG ANGIO  01/27/2020   RADIOLOGY WITH ANESTHESIA N/A 01/27/2020   Procedure: IR WITH ANESTHESIA;  Surgeon: Radiologist, Medication, MD;  Location: [place];  Service: Radiology;  Laterality: N/A;   TEE WITHOUT CARDIOVERSION N/A 03/11/2020   Procedure: TRANSESOPHAGEAL ECHOCARDIOGRAM (TEE);  Surgeon: [name], MD;  Location: [place];  Service: Cardiovascular;  Laterality: N/A;   TONSILLECTOMY     WISDOM TOOTH EXTRACTION     Past Surgical History:  Procedure Laterality Date   BUBBLE STUDY  03/11/2020   Procedure: BUBBLE STUDY;  Surgeon: [name], MD;  Location: [place];  Service: Cardiovascular;;   CARDIOVERSION N/A 05/13/2018   Procedure: CARDIOVERSION;  Surgeon: [name], MD;  Location: [place];  Service: Cardiovascular;  Laterality: N/A;   CARDIOVERSION N/A 06/13/2018   Procedure: CARDIOVERSION;  Surgeon: [name], MD;  Location: [facility] ENDOSCOPY;  Service: Cardiovascular;  Laterality: N/A;   CARDIOVERSION N/A 06/23/2018   Procedure: CARDIOVERSION;  Surgeon: [name], MD;  Location: [place];  Service: Cardiovascular;  Laterality: N/A;   CARDIOVERSION N/A 03/11/2020   Procedure: CARDIOVERSION;  Surgeon: [name], MD;  Location: [place];  Service: Cardiovascular;  Laterality: N/A;   excision of actinic keratosis     EYE SURGERY     eye lift   IR CT HEAD LTD  01/27/2020   IR PERCUTANEOUS ART THROMBECTOMY/INFUSION INTRACRANIAL INC DIAG ANGIO  01/27/2020       IR PERCUTANEOUS ART THROMBECTOMY/INFUSION INTRACRANIAL INC DIAG ANGIO  01/27/2020   RADIOLOGY WITH ANESTHESIA N/A 01/27/2020   Procedure: IR WITH ANESTHESIA;  Surgeon: Radiologist, Medication, MD;  Location: [place];  Service: Radiology;  Laterality: N/A;   TEE WITHOUT CARDIOVERSION N/A 03/11/2020   Procedure: TRANSESOPHAGEAL ECHOCARDIOGRAM (TEE);  Surgeon: [name], MD;  Location: [facility] ENDOSCOPY;  Service: Cardiovascular;  Laterality: N/A;   TONSILLECTOMY     WISDOM TOOTH EXTRACTION     Past Medical History:  Diagnosis Date   Hypertension    NICM (nonischemic cardiomyopathy) ([place]) 06/15/2018   04/23/18: EF 45%, 09/15/18: NOrmal LVEF   Paroxysmal atrial fibrillation ([place]) 06/15/2018   Stroke ([place])    Visit for monitoring Tikosyn therapy 06/12/2018   BP 126/82   Pulse 61   Temp 98.3 F (36.8 C)   Wt 161 lb (73 kg)   SpO2 98%    BMI 24.48 kg/m   Opioid Risk Score:   Fall Risk Score:  `1  Depression screen PHQ 2/9  Depression screen [facility] 2/9 07/18/2021 04/18/2021 06/21/2020 06/09/2020  Decreased Interest 0 0 0 0  Down, Depressed, Hopeless 0 0 0 0  PHQ - 2 Score 0 0 0 0  Some recent data might be hidden       Review of Systems  Constitutional: Negative.   HENT: Negative.    Eyes: Negative.   Respiratory: Negative.    Cardiovascular: Negative.   Gastrointestinal:  Positive for anal bleeding.  Endocrine: Negative.   Genitourinary: Negative.   Musculoskeletal:  Positive for gait problem.  Skin: Negative.   Allergic/Immunologic: Negative.   Hematological: Negative.   Psychiatric/Behavioral: Negative.        Objective:   Physical Exam Vitals and nursing note reviewed.  Constitutional:      Appearance: He is normal weight.  HENT:  Head: Normocephalic and atraumatic.  Eyes:     Extraocular Movements: Extraocular movements intact.     Conjunctiva/sclera: Conjunctivae normal.     Pupils: Pupils are equal, round, and reactive to light.  Musculoskeletal:        General: No swelling or tenderness.  Skin:    General: Skin is warm and dry.  Neurological:     General: No focal deficit present.     Mental Status: He is alert and oriented to person, place, and time.  Psychiatric:        Mood and Affect: Mood normal.        Behavior: Behavior normal.   Motor 4/5 Right delt bi tri , 3-Grip, 4/5 HF, KE, 2- ankle DF Left side 5/5  TOne - [name] 2 with clonus RIght finger flexors       Assessment & Plan:   Left MCA distribution infarct with RIght hemiparesis , spasticity and aphasia, plateauing in fxn , requiring additional community based exercise options , resume aquatic PT to instruct in pool based HEP 2.  Spasticity cont baclofen 20mg  TID, discussed botulinum toxin injection if finger flexor spasticity worsens  Extended visit time due to pt with aphasia, need to communicate with caregiver

## 2021-09-19 ENCOUNTER — Other Ambulatory Visit: Payer: Self-pay | Admitting: Cardiology

## 2021-09-25 ENCOUNTER — Other Ambulatory Visit: Payer: Self-pay | Admitting: Cardiology

## 2021-09-25 NOTE — Telephone Encounter (Signed)
Okay to refill? 

## 2021-09-26 ENCOUNTER — Other Ambulatory Visit: Payer: Self-pay | Admitting: Cardiology

## 2021-10-10 ENCOUNTER — Other Ambulatory Visit: Payer: Self-pay | Admitting: Physical Medicine & Rehabilitation

## 2021-10-27 ENCOUNTER — Encounter: Payer: Self-pay | Admitting: Adult Health

## 2021-10-27 NOTE — Telephone Encounter (Signed)
error 

## 2021-10-31 NOTE — Progress Notes (Signed)
? ? ?Subjective:  ? ? Patient ID: Dustin Schneider, male    DOB: August 13, 1956, 66 y.o.   MRN: [id number] ? ?This visit occurred during the SARS-CoV-2 public health emergency.  Safety protocols were in place, including screening questions prior to the visit, additional usage of staff PPE, and extensive cleaning of exam room while observing appropriate contact time as indicated for disinfecting solutions. ? ? ? ?HPI ?Dustin Schneider is here for  ?Chief Complaint  ?Patient presents with  ? Moles on chest  ?  Moles on the (referral for dermatologist); No physical therapy but wants to do Botox  ? ? ?Referral to Derm-  has a concerning mole on his chest and would like skin cancer screening. ? ?Referral for Botox - h/o of CVA with right sided hemiplegia associated with tremor - in the past he had botox injections by neuro and felt that helped and would like to have botox again.   ? ? ?Medications and allergies reviewed with patient and updated if appropriate. ? ?Current Outpatient Medications on File Prior to Visit  ?Medication Sig Dispense Refill  ? apixaban (ELIQUIS) 5 MG TABS tablet Take 1 tablet (5 mg total) by mouth 2 (two) times daily. 60 tablet 6  ? baclofen (LIORESAL) 20 MG tablet Take 1 tablet (20 mg total) by mouth 3 (three) times daily. 60 tablet 2  ? diltiazem (CARDIZEM CD) 180 MG 24 hr capsule TAKE (1) CAPSULE DAILY. 30 capsule 6  ? diltiazem (TIAZAC) 180 MG 24 hr capsule TAKE (1) CAPSULE DAILY. Oral for 30    ? dofetilide (TIKOSYN) 500 MCG capsule TAKE ONE CAPSULE BY MOUTH TWICE DAILY 180 capsule 3  ? fluorouracil (EFUDEX) 5 % cream External for 14    ? metoprolol tartrate (LOPRESSOR) 50 MG tablet Take 0.5 tablets (25 mg total) by mouth 2 (two) times daily. 180 tablet 0  ? rosuvastatin (CRESTOR) 10 MG tablet TAKE 1 TABLET BY MOUTH DAILY AFTER SUPPER 30 tablet 6  ? tamsulosin (FLOMAX) 0.4 MG CAPS capsule Oral for 30    ? ?No current facility-administered medications on file prior to visit.  ? ? ?Review of Systems ? ?    ?Objective:  ? ?Vitals:  ? 11/01/21 1335  ?BP: 118/68  ?Pulse: 70  ?Temp: 98.6 ?F (37 ?C)  ?SpO2: 97%  ? ?BP Readings from Last 3 Encounters:  ?11/01/21 118/68  ?07/18/21 126/82  ?06/26/21 139/76  ? ?Wt Readings from Last 3 Encounters:  ?11/01/21 165 lb (74.8 kg)  ?07/18/21 161 lb (73 kg)  ?06/26/21 161 lb (73 kg)  ? ?Body mass index is 25.09 kg/m?. ? ?  ?Physical Exam ?Constitutional:   ?   General: He is not in acute distress. ?   Appearance: Normal appearance.  ?HENT:  ?   Head: Normocephalic and atraumatic.  ?Skin: ?   General: Skin is warm and dry.  ?   Comments: Several moles - mostly benign appearing on chest, arms, face.  One larger irregularly shaped mole on left upper chest that has area of hyperpigmentation that looks concerning  ?Neurological:  ?   Mental Status: He is alert.  ? ?   ? ? ? ? ? ?Assessment & Plan:  ? ? ?Spastic hemiplegia affecting right side from prior CVA : ?Has tremor in right lower leg - has had botox in the past and would like to try that again ?Referral for neuro ordered for botox - already seeing neuro, but not sure if he needs referral for different  provider who does botox ? ?Skin cancer screening, atypical mole: ? Referral to [place] derm - Dr Dustin Schneider who he has seen previously for complete skin check ? ? ? ? ?

## 2021-11-01 ENCOUNTER — Ambulatory Visit (INDEPENDENT_AMBULATORY_CARE_PROVIDER_SITE_OTHER): Payer: Medicare Other | Admitting: Internal Medicine

## 2021-11-01 ENCOUNTER — Encounter: Payer: Self-pay | Admitting: Internal Medicine

## 2021-11-01 ENCOUNTER — Other Ambulatory Visit: Payer: Self-pay

## 2021-11-01 VITALS — BP 118/68 | HR 70 | Temp 98.6°F | Ht 68.0 in | Wt 165.0 lb

## 2021-11-01 DIAGNOSIS — D229 Melanocytic nevi, unspecified: Secondary | ICD-10-CM

## 2021-11-01 DIAGNOSIS — G8110 Spastic hemiplegia affecting unspecified side: Secondary | ICD-10-CM

## 2021-11-01 DIAGNOSIS — Z1283 Encounter for screening for malignant neoplasm of skin: Secondary | ICD-10-CM

## 2021-11-01 NOTE — Patient Instructions (Addendum)
? ? ? ? ?  A referral was ordered for Dermatology - Dr Elvera Lennox and Neurology.     Someone from each office will call you to schedule an appointment.  ?

## 2021-11-14 ENCOUNTER — Encounter: Payer: Medicare Other | Attending: Physical Medicine & Rehabilitation | Admitting: Physical Medicine & Rehabilitation

## 2021-11-14 ENCOUNTER — Encounter: Payer: Self-pay | Admitting: Physical Medicine & Rehabilitation

## 2021-11-14 VITALS — BP 120/72 | HR 61 | Ht 68.0 in | Wt 169.8 lb

## 2021-11-14 DIAGNOSIS — G8111 Spastic hemiplegia affecting right dominant side: Secondary | ICD-10-CM | POA: Diagnosis present

## 2021-11-14 NOTE — Progress Notes (Signed)
? ?Subjective:  ? ? Patient ID: Dustin Schneider, male    DOB: 1956/05/14, 66 y.o.   MRN: [id number] ? 66 y.o. male with history of PAF/on Xarelto, and ICM; who was admitted on 01/27/2020 after collapsing at [facility] hardware. He was found to have right-sided weakness with left gaze preference as well as nausea and vomiting. CT head at admission revealed hyperdense sign left M2 branch compatible with acute thrombus and he underwent cerebral angio with complete recanalization of left-MCA and ACA. 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 He was maintained on aspirin given IR petechial hemorrhage and transition to Eliquis by 06/22. He was kept NPO till discharge due to  signs of dysphagia with risk of aspiration and core ?tak placed for nutritional support.  ?HPI ?66 year old male returns today for follow-up.  He has had increasing spasticity.  Former patient of Dr. Posey Pronto (who left practice to reloacate in [place]) who did inject botulinum toxin injection for the patient ?Xeomin used 50U /ml ?06/21/20 ?MUSCLE UNITS ?Right    FCR: 20 units ? FCU: 20 units ? Adductor Pollicis: 10 units ? Flexor Hallucis Longus 25 ? Flexor Digitorum Longus 25 ? ?Total units used: 100 ? ?XeominXeomin: ?Procedure Note ?Patient Name: Dustin Schneider ?DOB: November 03, 1955 ?MRN: [id number] ?Date: 09/22/2020 ?Right Flexor Hallucis Longus: 35 ? Flexor Digitorum Longus: 35 ? Gastroc Med: 30 ? ?Patient feels like his leg has started turning more at the ankle and the toes are starting to flex more.  Also problem with keeping heel on the ground. ?In addition his upper extremity has shaking.  He states this is not as worrisome as the lower extremity but still does interfere with activities. ?Because of patient's expressive aphasia care was taken to simplify explanations and get yes no responses with the help of his  caregiver. ? ? ? ?Uses shower chair for bathing ?Dresses self  ?No falls ?Amb with cane and AFO  ?Pain Inventory ?Average Pain 0 ?Pain Right Now 0 ? ? ?In the last 24 hours, has pain interfered with the following? ?General activity 0 ?Relation with others 0 ?Enjoyment of life 0 ?Sleep (in general) NA ? ?Pain is worse with:  no pain ?Pain improves with:  no pain ?Relief from Meds:  no pain ? ?Family History  ?Problem Relation Age of Onset  ? Glaucoma Mother   ? Atrial fibrillation Father   ? ?Social History  ? ?Socioeconomic History  ? Marital status: Divorced  ?  Spouse name: Not on file  ? Number of children: 1  ? Years of education: Not on file  ? Highest education level: Not on file  ?Occupational History  ? Not on file  ?Tobacco Use  ? Smoking status: Never  ? Smokeless tobacco: Never  ?Vaping Use  ? Vaping Use: Never used  ?Substance and Sexual Activity  ? Alcohol use: Not Currently  ? Drug use: No  ? Sexual activity: Not on file  ?  Comment: DIVORCE  ?Other Topics Concern  ? Not on file  ?Social History Narrative  ? 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 Lives alone in [place]  ? ?Social Determinants of Health  ? ?Financial Resource Strain: Not on file  ?Food Insecurity: Not on file  ?Transportation Needs: Not on file  ?Physical Activity: Not on file  ?Stress: Not on file  ?Social Connections: Not on file  ? ?Past Surgical History:  ?  Procedure Laterality Date  ? BUBBLE STUDY  03/11/2020  ? Procedure: BUBBLE STUDY;  Surgeon: [name], MD;  Location: [place];  Service: Cardiovascular;;  ? CARDIOVERSION N/A 05/13/2018  ? Procedure: CARDIOVERSION;  Surgeon: [name], MD;  Location: [place];  Service: Cardiovascular;  Laterality: N/A;  ? CARDIOVERSION N/A 06/13/2018  ? Procedure: CARDIOVERSION;  Surgeon: [name], MD;  Location: [facility] ENDOSCOPY;  Service: Cardiovascular;  Laterality: N/A;  ? CARDIOVERSION N/A 06/23/2018  ? Procedure: CARDIOVERSION;  Surgeon: [name], MD;  Location: [place];   Service: Cardiovascular;  Laterality: N/A;  ? CARDIOVERSION N/A 03/11/2020  ? Procedure: CARDIOVERSION;  Surgeon: [name], MD;  Location: [facility] ENDOSCOPY;  Service: Cardiovascular;  Laterality: N/A;  ? excision of actinic keratosis    ? EYE SURGERY    ? eye lift  ? IR CT HEAD LTD  01/27/2020  ? IR PERCUTANEOUS ART THROMBECTOMY/INFUSION INTRACRANIAL INC DIAG ANGIO  01/27/2020  ?    ? IR PERCUTANEOUS ART THROMBECTOMY/INFUSION INTRACRANIAL INC DIAG ANGIO  01/27/2020  ? RADIOLOGY WITH ANESTHESIA N/A 01/27/2020  ? Procedure: IR WITH ANESTHESIA;  Surgeon: Radiologist, Medication, MD;  Location: [place];  Service: Radiology;  Laterality: N/A;  ? TEE WITHOUT CARDIOVERSION N/A 03/11/2020  ? Procedure: TRANSESOPHAGEAL ECHOCARDIOGRAM (TEE);  Surgeon: [name], MD;  Location: [place];  Service: Cardiovascular;  Laterality: N/A;  ? TONSILLECTOMY    ? WISDOM TOOTH EXTRACTION    ? ?Past Surgical History:  ?Procedure Laterality Date  ? BUBBLE STUDY  03/11/2020  ? Procedure: BUBBLE STUDY;  Surgeon: [name], MD;  Location: [place];  Service: Cardiovascular;;  ? CARDIOVERSION N/A 05/13/2018  ? Procedure: CARDIOVERSION;  Surgeon: [name], MD;  Location: [place];  Service: Cardiovascular;  Laterality: N/A;  ? CARDIOVERSION N/A 06/13/2018  ? Procedure: CARDIOVERSION;  Surgeon: [name], MD;  Location: [facility] ENDOSCOPY;  Service: Cardiovascular;  Laterality: N/A;  ? CARDIOVERSION N/A 06/23/2018  ? Procedure: CARDIOVERSION;  Surgeon: [name], MD;  Location: [place];  Service: Cardiovascular;  Laterality: N/A;  ? CARDIOVERSION N/A 03/11/2020  ? Procedure: CARDIOVERSION;  Surgeon: [name], MD;  Location: [facility] ENDOSCOPY;  Service: Cardiovascular;  Laterality: N/A;  ? excision of actinic keratosis    ? EYE SURGERY    ? eye lift  ? IR CT HEAD LTD  01/27/2020  ? IR PERCUTANEOUS ART THROMBECTOMY/INFUSION INTRACRANIAL INC DIAG ANGIO  01/27/2020  ?    ? IR PERCUTANEOUS ART THROMBECTOMY/INFUSION INTRACRANIAL INC DIAG ANGIO   01/27/2020  ? RADIOLOGY WITH ANESTHESIA N/A 01/27/2020  ? Procedure: IR WITH ANESTHESIA;  Surgeon: Radiologist, Medication, MD;  Location: [place];  Service: Radiology;  Laterality: N/A;  ? TEE WITHOUT CARDIOVERSION N/A 03/11/2020  ? Procedure: TRANSESOPHAGEAL ECHOCARDIOGRAM (TEE);  Surgeon: [name], MD;  Location: [place];  Service: Cardiovascular;  Laterality: N/A;  ? TONSILLECTOMY    ? WISDOM TOOTH EXTRACTION    ? ?Past Medical History:  ?Diagnosis Date  ? Hypertension   ? NICM (nonischemic cardiomyopathy) ([place]) 06/15/2018  ? 04/23/18: EF 45%, 09/15/18: NOrmal LVEF  ? Paroxysmal atrial fibrillation ([place]) 06/15/2018  ? Stroke [facility])   ? Visit for monitoring Tikosyn therapy 06/12/2018  ? ?BP 120/72   Pulse 61   Ht '5\' 8"'$  (1.727 m)   Wt 169 lb 12.8 oz (77 kg)   SpO2 94%   BMI 25.82 kg/m?  ? ?Opioid Risk Score:   ?Fall Risk Score:  `1 ? ?Depression screen PHQ 2/9 ? ? ?  11/14/2021  ?  3:04 PM 07/18/2021  ?  3:08 PM 07/18/2021  ?  2:53 PM 04/18/2021  ?  2:13 PM 06/21/2020  ? 11:30 AM 06/09/2020  ?  2:08 PM 03/17/2020  ?  3:24 PM  ?Depression screen PHQ 2/9  ?Decreased Interest 0 0 0 0 0 0 0  ?Down, Depressed, Hopeless 0 0 0 0 0 0 0  ?PHQ - 2 Score 0 0 0 0 0 0 0  ?Altered sleeping       1  ?Tired, decreased energy       0  ?Change in appetite       0  ?Feeling bad or failure about yourself        0  ?Trouble concentrating       1  ?Moving slowly or fidgety/restless       3  ?Suicidal thoughts       0  ?PHQ-9 Score       5  ?Difficult doing work/chores       Not difficult at all  ?  ? ?Review of Systems  ?Constitutional: Negative.   ?HENT: Negative.    ?Eyes: Negative.   ?Respiratory: Negative.    ?Cardiovascular: Negative.   ?Gastrointestinal: Negative.   ?Endocrine: Negative.   ?Genitourinary: Negative.   ?Musculoskeletal:  Positive for gait problem.  ?Skin: Negative.   ?Allergic/Immunologic: Negative.   ?Hematological: Negative.   ?Psychiatric/Behavioral: Negative.    ?All other systems reviewed and are negative. ? ?    ?Objective:  ? Physical Exam ? ?Motor strength is 3 - at the right deltoid bicep tricep finger flexors and extensors ?Tone right upper extremity [name] 2 at the finger flexors however he has severe clonus in the finger and wrist fle

## 2021-11-21 ENCOUNTER — Other Ambulatory Visit: Payer: Self-pay | Admitting: Cardiology

## 2021-12-12 ENCOUNTER — Other Ambulatory Visit: Payer: Self-pay | Admitting: Physical Medicine & Rehabilitation

## 2021-12-25 ENCOUNTER — Ambulatory Visit: Payer: Medicare Other | Admitting: Student

## 2021-12-25 ENCOUNTER — Encounter: Payer: Self-pay | Admitting: Student

## 2021-12-25 VITALS — BP 131/78 | HR 50 | Temp 98.2°F | Resp 17 | Ht 68.0 in | Wt 166.4 lb

## 2021-12-25 DIAGNOSIS — Z79899 Other long term (current) drug therapy: Secondary | ICD-10-CM

## 2021-12-25 DIAGNOSIS — I5189 Other ill-defined heart diseases: Secondary | ICD-10-CM

## 2021-12-25 DIAGNOSIS — I480 Paroxysmal atrial fibrillation: Secondary | ICD-10-CM

## 2021-12-25 DIAGNOSIS — I10 Essential (primary) hypertension: Secondary | ICD-10-CM

## 2021-12-25 DIAGNOSIS — Z7901 Long term (current) use of anticoagulants: Secondary | ICD-10-CM

## 2021-12-25 DIAGNOSIS — E7800 Pure hypercholesterolemia, unspecified: Secondary | ICD-10-CM

## 2021-12-25 NOTE — Progress Notes (Signed)
? ?Primary Physician/Referring:  [name], MD ? ?Patient ID: Dustin Schneider, male    DOB: [birth year], 66 y.o.   MRN: [id number] ? ?Chief Complaint  ?Patient presents with  ? Follow-up  ?  6 months  ? Atrial Fibrillation  ? hld  ? HFpEF  ? ?HPI:   ? ?Dustin Schneider  is a 66 y.o.  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 He was restarted on Tikosyn during his last hospitalization and also underwent TEE cardioversion.   ? ?Patient presents for 20-monthfollow-up of paroxysmal atrial fibrillation, hyperlipidemia, and chronic diastolic heart failure.  Last office visit reduced Lopressor given bradycardia.  Patient now presents for follow-up and is without specific complaints, overall feeling well.  Denies chest pain, palpitations, syncope, dizziness.  He has had no known recurrence of atrial fibrillation and continues to tolerate anticoagulation without bleeding diathesis.   ? ?Past Medical History:  ?Diagnosis Date  ? Hypertension   ? [place]  ? 04/23/18: EF 45%, 09/15/18: NOrmal LVEF  ? [place] 06/[place]  ? Stroke [facility]   ? Visit for monitoring Tikosyn therapy 06/12/2018  ? ?Past Surgical History:  ?Procedure Laterality Date  ? BUBBLE STUDY  03/11/2020  ? Procedure: BUBBLE STUDY;  Surgeon: GAdrian Prows MD;  [place]  Service: Cardiovascular;;  ? CARDIOVERSION N/A 05/13/2018  ? Procedure: CARDIOVERSION;  Surgeon: GAdrian Prows MD;  [place]  Service: Cardiovascular;  Laterality: N/A;  ? CARDIOVERSION N/A 06/13/2018  ? Procedure: CARDIOVERSION;   Surgeon: NJosue Hector MD;  [facility];  Service: Cardiovascular;  Laterality: N/A;  ? CARDIOVERSION N/A 06/23/2018  ? Procedure: CARDIOVERSION;  Surgeon: GAdrian Prows MD;  [place]  Service: Cardiovascular;  Laterality: N/A;  ? CARDIOVERSION N/A 03/11/2020  ? Procedure: CARDIOVERSION;  Surgeon: GAdrian Prows MD;  [facility];  Service: Cardiovascular;  Laterality: N/A;  ? excision of actinic keratosis    ? EYE SURGERY    ? eye lift  ? IR CT HEAD LTD  01/27/2020  ? IR PERCUTANEOUS ART THROMBECTOMY/INFUSION INTRACRANIAL INC DIAG ANGIO  01/27/2020  ?    ? IR PERCUTANEOUS ART THROMBECTOMY/INFUSION INTRACRANIAL INC DIAG ANGIO  01/27/2020  ? RADIOLOGY WITH ANESTHESIA N/A 01/27/2020  ? Procedure: IR WITH ANESTHESIA;  Surgeon: Radiologist, Medication, MD;  [place]  Service: Radiology;  Laterality: N/A;  ? TEE WITHOUT CARDIOVERSION N/A 03/11/2020  ? Procedure: TRANSESOPHAGEAL ECHOCARDIOGRAM (TEE);  Surgeon: GAdrian Prows MD;  [place]  Service: Cardiovascular;  Laterality: N/A;  ? TONSILLECTOMY    ? WISDOM TOOTH EXTRACTION    ? ?Family History  ?Problem Relation Age of Onset  ? Glaucoma Mother   ? Atrial fibrillation Father   ?  ?Social History  ? ?Tobacco Use  ? Smoking status: Never  ? Smokeless tobacco: Never  ?Substance Use Topics  ? Alcohol use: Not Currently  ? ?Marital Status: Divorced  ?ROS  ?Review of Systems  ?Constitutional:  Negative for malaise/fatigue and weight loss.  ?Respiratory:  Negative for cough, hemoptysis and shortness of breath.   ?Cardiovascular:  Negative for chest pain, palpitations, claudication and leg swelling.  ?Gastrointestinal:  Negative for  blood in stool, heartburn and melena.  ?Genitourinary:  Negative for dysuria.  ?Musculoskeletal:  Negative for joint pain and myalgias.  ?Neurological:  Positive for speech change, focal weakness and weakness. Negative for dizziness, seizures and headaches.  ?Endo/Heme/Allergies:  Does not  bruise/bleed easily.  ?Psychiatric/Behavioral:  Negative for depression. The patient is not nervous/anxious.   ?All other systems reviewed and are negative. ?Objective  ?Blood pressure 131/78, pulse (!) 50, temperature 98.2 ?F (36.8 ?C), temperature source Temporal, resp. rate 17, height '5\' 8"'$  (1.727 m), weight 166 lb 6.4 oz (75.5 kg), SpO2 98 %.  ? ?  12/25/2021  ? 11:05 AM 12/25/2021  ? 10:53 AM 11/14/2021  ?  3:03 PM  ?Vitals with BMI  ?Height  '5\' 8"'$  '5\' 8"'$   ?Weight  166 lbs 6 oz 169 lbs 13 oz  ?BMI  25.31 25.82  ?Systolic 382 505 397  ?Diastolic 78 80 72  ?Pulse 50 49 61  ? Physical Exam ?Vitals reviewed.  ?Cardiovascular:  ?   Rate and Rhythm: Regular rhythm. Bradycardia present.  ?   Pulses: Intact distal pulses.  ?   Heart sounds: Normal heart sounds, S1 normal and S2 normal. No murmur heard. ?  No gallop.  ?   Comments: No leg edema, no JVD. ?Pulmonary:  ?   Effort: Pulmonary effort is normal. No respiratory distress.  ?   Breath sounds: Normal breath sounds. No wheezing, rhonchi or rales.  ?Musculoskeletal:  ?   Right lower leg: No edema.  ?   Left lower leg: No edema.  ?Neurological:  ?   Gait: Gait abnormal (right spastic hemiplegia).  ? ? ?Laboratory examination:  ? ?Recent Labs  ?  03/27/21 ?1400  ?NA 140  ?K 4.2  ?CL 101  ?CO2 32  ?GLUCOSE 94  ?BUN 20  ?CREATININE 0.92  ?CALCIUM 9.7  ? ?CrCl cannot be calculated (Patient's most recent lab result is older than the maximum 21 days allowed.).  ? ?  Latest Ref Rng & Units 03/27/2021  ?  2:00 PM 03/11/2020  ?  1:38 AM 03/10/2020  ?  4:21 AM  ?CMP  ?Glucose 70 - 99 mg/dL 94   111   107    ?BUN 6 - 23 mg/dL '20   12   13    '$ ?Creatinine 0.40 - 1.50 mg/dL 0.92   0.85   0.90    ?Sodium 135 - 145 mEq/L 140   139   137    ?Potassium 3.5 - 5.1 mEq/L 4.2   4.1   4.5    ?Chloride 96 - 112 mEq/L 101   103   104    ?CO2 19 - 32 mEq/L 32   26   24    ?Calcium 8.4 - 10.5 mg/dL 9.7   8.6   8.4    ? ? ?  Latest Ref Rng & Units 03/27/2021  ?  2:00 PM 03/09/2020  ?  6:56 AM 03/07/2020   ?  4:34 AM  ?CBC  ?WBC 4.0 - 10.5 K/uL 7.3   5.7   9.2    ?Hemoglobin 13.0 - 17.0 g/dL 14.9   11.3   11.3    ?Hematocrit 39.0 - 52.0 % 45.3   34.7   34.8    ?Platelets 150.0 - 400.0 K/uL 215.0   234   180    ? ?Lipid Panel ?Recent Labs  ?  03/27/21 ?1400  ?CHOL 137  ?TRIG 178.0*  ?[place] 52  ?VLDL 35.6  ?  HDL 49.30  ?CHOLHDL 3  ?  ?HEMOGLOBIN A1C ?Lab Results  ?Component Value Date  ? HGBA1C 5.6 01/28/2020  ? MPG 114 01/28/2020  ?  ? ?TSH ?Recent Labs  ?  03/27/21 ?1400  ?TSH 2.08  ? ?Allergies  ?No Known Allergies  ? ?Medications Prior to Visit:  ? ?Outpatient Medications Prior to Visit  ?Medication Sig Dispense Refill  ? baclofen (LIORESAL) 20 MG tablet Take 1 tablet (20 mg total) by mouth 3 (three) times daily. 60 tablet 2  ? diltiazem (CARDIZEM CD) 180 MG 24 hr capsule TAKE (1) CAPSULE DAILY. 30 capsule 6  ? dofetilide (TIKOSYN) 500 MCG capsule TAKE ONE CAPSULE BY MOUTH TWICE DAILY 180 capsule 3  ? ELIQUIS 5 MG TABS tablet Take 1 tablet (5 mg total) by mouth 2 (two) times daily. 60 tablet 6  ? fluorouracil (EFUDEX) 5 % cream External for 14    ? rosuvastatin (CRESTOR) 10 MG tablet TAKE 1 TABLET BY MOUTH DAILY AFTER SUPPER 30 tablet 6  ? tamsulosin (FLOMAX) 0.4 MG CAPS capsule Oral for 30    ? metoprolol tartrate (LOPRESSOR) 50 MG tablet Take 0.5 tablets (25 mg total) by mouth 2 (two) times daily. 180 tablet 0  ? ?No facility-administered medications prior to visit.  ? ?Final Medications at End of Visit   ? ?Current Meds  ?Medication Sig  ? baclofen (LIORESAL) 20 MG tablet Take 1 tablet (20 mg total) by mouth 3 (three) times daily.  ? diltiazem (CARDIZEM CD) 180 MG 24 hr capsule TAKE (1) CAPSULE DAILY.  ? dofetilide (TIKOSYN) 500 MCG capsule TAKE ONE CAPSULE BY MOUTH TWICE DAILY  ? ELIQUIS 5 MG TABS tablet Take 1 tablet (5 mg total) by mouth 2 (two) times daily.  ? fluorouracil (EFUDEX) 5 % cream External for 14  ? rosuvastatin (CRESTOR) 10 MG tablet TAKE 1 TABLET BY MOUTH DAILY AFTER SUPPER  ? tamsulosin  (FLOMAX) 0.4 MG CAPS capsule Oral for 30  ? [DISCONTINUED] metoprolol tartrate (LOPRESSOR) 50 MG tablet Take 0.5 tablets (25 mg total) by mouth 2 (two) times daily.  ?  ?Radiology:  ? ?No results found. ? ?Cardiac Studies:

## 2022-01-02 ENCOUNTER — Encounter: Payer: Medicare Other | Attending: Physical Medicine & Rehabilitation | Admitting: Physical Medicine & Rehabilitation

## 2022-01-02 ENCOUNTER — Encounter: Payer: Self-pay | Admitting: Physical Medicine & Rehabilitation

## 2022-01-02 VITALS — BP 138/79 | HR 65 | Temp 98.2°F | Ht 68.0 in | Wt 169.4 lb

## 2022-01-02 DIAGNOSIS — G8110 Spastic hemiplegia affecting unspecified side: Secondary | ICD-10-CM | POA: Diagnosis present

## 2022-01-02 DIAGNOSIS — G8113 Spastic hemiplegia affecting right nondominant side: Secondary | ICD-10-CM

## 2022-01-02 NOTE — Progress Notes (Signed)
Xeomin Injection for spasticity using needle EMG guidance  Dilution: 50 Units/ml Indication: Severe spasticity which interferes with ADL,mobility and/or  hygiene and is unresponsive to medication management and other conservative care Informed consent was obtained after describing risks and benefits of the procedure with the patient. This includes bleeding, bruising, infection, excessive weakness, or medication side effects. A REMS form is on file and signed. Needle: 27g 1" for UE, 25 g 2" for LE needle electrode Number of units per muscle Xeomin/botulinum toxin injection. Plan for muscle group selection Right upper extremity FPL 25 units FDS 25 units FDP 25 units FCR 25 units     Right lower extremity Medial gastroc 50 units  FDL 75 units Posterior tibialis 75 units All injections were done after obtaining appropriate EMG activity and after negative drawback for blood. The patient tolerated the procedure well. Post procedure instructions were given. A followup appointment was made.

## 2022-01-02 NOTE — Patient Instructions (Signed)
You received a Xeomin injection today. You may experience soreness at the needle injection sites. Please call us if any of the injection sites turns red after a couple days or if there is any drainage. You may experience muscle weakness as a result of Xeomin This would improve with time but can take several weeks to improve. The Xeomin should start working in about one week. The Xeomin usually last 3 months. The injection can be repeated every 3 months as needed.  

## 2022-01-11 ENCOUNTER — Ambulatory Visit: Payer: Medicare Other | Admitting: Neurology

## 2022-01-22 ENCOUNTER — Telehealth: Payer: Self-pay

## 2022-01-22 NOTE — Telephone Encounter (Signed)
Form has been given to PCP 

## 2022-01-22 NOTE — Telephone Encounter (Signed)
Pt SO is requesting a form be filled out for the pt to be able to participate in [facility]. Without the form the pt isn't able to get on the horses.   [name] will be emailing the form and I will place it in the providers box  Please advise

## 2022-01-26 NOTE — Telephone Encounter (Signed)
Dustin Schneider called to get an update on letter for Horse therapy advised her that Dr.Jones does have the form it is a 7-10 day turnaround  [phone]  [facility] [place]

## 2022-01-29 ENCOUNTER — Ambulatory Visit: Payer: Medicare Other | Admitting: Adult Health

## 2022-01-29 NOTE — Telephone Encounter (Signed)
[name] significant other, [name], has been informed and he has an OV tomorrow to discuss.

## 2022-01-30 ENCOUNTER — Ambulatory Visit (INDEPENDENT_AMBULATORY_CARE_PROVIDER_SITE_OTHER): Payer: Medicare Other | Admitting: Internal Medicine

## 2022-01-30 ENCOUNTER — Encounter: Payer: Self-pay | Admitting: Internal Medicine

## 2022-01-30 VITALS — BP 120/70 | HR 60 | Temp 98.2°F | Ht 68.0 in | Wt 166.0 lb

## 2022-01-30 DIAGNOSIS — R972 Elevated prostate specific antigen [PSA]: Secondary | ICD-10-CM | POA: Diagnosis not present

## 2022-01-30 DIAGNOSIS — I10 Essential (primary) hypertension: Secondary | ICD-10-CM | POA: Diagnosis not present

## 2022-01-30 DIAGNOSIS — N400 Enlarged prostate without lower urinary tract symptoms: Secondary | ICD-10-CM | POA: Diagnosis not present

## 2022-01-30 DIAGNOSIS — I480 Paroxysmal atrial fibrillation: Secondary | ICD-10-CM

## 2022-01-30 DIAGNOSIS — E785 Hyperlipidemia, unspecified: Secondary | ICD-10-CM | POA: Diagnosis not present

## 2022-01-30 DIAGNOSIS — Z1211 Encounter for screening for malignant neoplasm of colon: Secondary | ICD-10-CM

## 2022-01-30 LAB — BASIC METABOLIC PANEL
BUN: 16 mg/dL (ref 6–23)
CO2: 29 mEq/L (ref 19–32)
Calcium: 9.8 mg/dL (ref 8.4–10.5)
Chloride: 103 mEq/L (ref 96–112)
Creatinine, Ser: 0.91 mg/dL (ref 0.40–1.50)
GFR: 87.01 mL/min (ref >60.00)
Glucose, Bld: 111 mg/dL — ABNORMAL HIGH (ref 70–99)
Potassium: 4.2 mEq/L (ref 3.5–5.1)
Sodium: 140 mEq/L (ref 135–145)

## 2022-01-30 LAB — LIPID PANEL
Cholesterol: 130 mg/dL (ref 0–200)
HDL: 49.8 mg/dL (ref >39.00)
LDL Cholesterol: 61 mg/dL (ref 0–99)
NonHDL: 80.65
Total CHOL/HDL Ratio: 3
Triglycerides: 100 mg/dL (ref 0.0–149.0)
VLDL: 20 mg/dL (ref 0.0–40.0)

## 2022-01-30 LAB — CBC WITH DIFFERENTIAL/PLATELET
Basophils Absolute: 0 10*3/uL (ref 0.0–0.1)
Basophils Relative: 0.8 % (ref 0.0–3.0)
Eosinophils Absolute: 0.1 10*3/uL (ref 0.0–0.7)
Eosinophils Relative: 1.4 % (ref 0.0–5.0)
HCT: 42.8 % (ref 39.0–52.0)
Hemoglobin: 14.3 g/dL (ref 13.0–17.0)
Lymphocytes Relative: 28 % (ref 12.0–46.0)
Lymphs Abs: 1.5 10*3/uL (ref 0.7–4.0)
MCHC: 33.5 g/dL (ref 30.0–36.0)
MCV: 95 fl (ref 78.0–100.0)
Monocytes Absolute: 0.5 10*3/uL (ref 0.1–1.0)
Monocytes Relative: 8.3 % (ref 3.0–12.0)
Neutro Abs: 3.4 10*3/uL (ref 1.4–7.7)
Neutrophils Relative %: 61.5 % (ref 43.0–77.0)
Platelets: 203 10*3/uL (ref 150.0–400.0)
RBC: 4.5 Mil/uL (ref 4.22–5.81)
RDW: 13.9 % (ref 11.5–15.5)
WBC: 5.5 10*3/uL (ref 4.0–10.5)

## 2022-01-30 LAB — HEPATIC FUNCTION PANEL
ALT: 18 U/L (ref 0–53)
AST: 14 U/L (ref 0–37)
Albumin: 4.4 g/dL (ref 3.5–5.2)
Alkaline Phosphatase: 55 U/L (ref 39–117)
Bilirubin, Direct: 0.1 mg/dL (ref 0.0–0.3)
Total Bilirubin: 0.6 mg/dL (ref 0.2–1.2)
Total Protein: 6.5 g/dL (ref 6.0–8.3)

## 2022-01-30 LAB — PSA: PSA: 6.12 ng/mL — ABNORMAL HIGH (ref 0.10–4.00)

## 2022-01-30 NOTE — Patient Instructions (Signed)
Hypertension, Adult High blood pressure (hypertension) is when the force of blood pumping through the arteries is too strong. The arteries are the blood vessels that carry blood from the heart throughout the body. Hypertension forces the heart to work harder to pump blood and may cause arteries to become narrow or stiff. Untreated or uncontrolled hypertension can lead to a heart attack, heart failure, a stroke, kidney disease, and other problems. A blood pressure reading consists of a higher number over a lower number. Ideally, your blood pressure should be below 120/80. The first ("top") number is called the systolic pressure. It is a measure of the pressure in your arteries as your heart beats. The second ("bottom") number is called the diastolic pressure. It is a measure of the pressure in your arteries as the heart relaxes. What are the causes? The exact cause of this condition is not known. There are some conditions that result in high blood pressure. What increases the risk? Certain factors may make you more likely to develop high blood pressure. Some of these risk factors are under your control, including: Smoking. Not getting enough exercise or physical activity. Being overweight. Having too much fat, sugar, calories, or salt (sodium) in your diet. Drinking too much alcohol. Other risk factors include: Having a personal history of heart disease, diabetes, high cholesterol, or kidney disease. Stress. Having a family history of high blood pressure and high cholesterol. Having obstructive sleep apnea. Age. The risk increases with age. What are the signs or symptoms? High blood pressure may not cause symptoms. Very high blood pressure (hypertensive crisis) may cause: Headache. Fast or irregular heartbeats (palpitations). Shortness of breath. Nosebleed. Nausea and vomiting. Vision changes. Severe chest pain, dizziness, and seizures. How is this diagnosed? This condition is diagnosed by  measuring your blood pressure while you are seated, with your arm resting on a flat surface, your legs uncrossed, and your feet flat on the floor. The cuff of the blood pressure monitor will be placed directly against the skin of your upper arm at the level of your heart. Blood pressure should be measured at least twice using the same arm. Certain conditions can cause a difference in blood pressure between your right and left arms. If you have a high blood pressure reading during one visit or you have normal blood pressure with other risk factors, you may be asked to: Return on a different day to have your blood pressure checked again. Monitor your blood pressure at home for 1 week or longer. If you are diagnosed with hypertension, you may have other blood or imaging tests to help your health care provider understand your overall risk for other conditions. How is this treated? This condition is treated by making healthy lifestyle changes, such as eating healthy foods, exercising more, and reducing your alcohol intake. You may be referred for counseling on a healthy diet and physical activity. Your health care provider may prescribe medicine if lifestyle changes are not enough to get your blood pressure under control and if: Your systolic blood pressure is above 130. Your diastolic blood pressure is above 80. Your personal target blood pressure may vary depending on your medical conditions, your age, and other factors. Follow these instructions at home: Eating and drinking  Eat a diet that is high in fiber and potassium, and low in sodium, added sugar, and fat. An example of this eating plan is called the DASH diet. DASH stands for Dietary Approaches to Stop Hypertension. To eat this way: Eat   plenty of fresh fruits and vegetables. Try to fill one half of your plate at each meal with fruits and vegetables. Eat whole grains, such as whole-wheat pasta, brown rice, or whole-grain bread. Fill about one  fourth of your plate with whole grains. Eat or drink low-fat dairy products, such as skim milk or low-fat yogurt. Avoid fatty cuts of meat, processed or cured meats, and poultry with skin. Fill about one fourth of your plate with lean proteins, such as fish, chicken without skin, beans, eggs, or tofu. Avoid pre-made and processed foods. These tend to be higher in sodium, added sugar, and fat. Reduce your daily sodium intake. Many people with hypertension should eat less than 1,500 mg of sodium a day. Do not drink alcohol if: Your health care provider tells you not to drink. You are pregnant, may be pregnant, or are planning to become pregnant. If you drink alcohol: Limit how much you have to: 0-1 drink a day for women. 0-2 drinks a day for men. Know how much alcohol is in your drink. In the [place] one drink equals one 12 oz bottle of beer (355 mL), one 5 oz glass of wine (148 mL), or one 1 oz glass of hard liquor (44 mL). Lifestyle  Work with your health care provider to maintain a healthy body weight or to lose weight. Ask what an ideal weight is for you. Get at least 30 minutes of exercise that causes your heart to beat faster (aerobic exercise) most days of the week. Activities may include walking, swimming, or biking. Include exercise to strengthen your muscles (resistance exercise), such as Pilates or lifting weights, as part of your weekly exercise routine. Try to do these types of exercises for 30 minutes at least 3 days a week. Do not use any products that contain nicotine or tobacco. These products include cigarettes, chewing tobacco, and vaping devices, such as e-cigarettes. If you need help quitting, ask your health care provider. Monitor your blood pressure at home as told by your health care provider. Keep all follow-up visits. This is important. Medicines Take over-the-counter and prescription medicines only as told by your health care provider. Follow directions carefully. Blood  pressure medicines must be taken as prescribed. Do not skip doses of blood pressure medicine. Doing this puts you at risk for problems and can make the medicine less effective. Ask your health care provider about side effects or reactions to medicines that you should watch for. Contact a health care provider if you: Think you are having a reaction to a medicine you are taking. Have headaches that keep coming back (recurring). Feel dizzy. Have swelling in your ankles. Have trouble with your vision. Get help right away if you: Develop a severe headache or confusion. Have unusual weakness or numbness. Feel faint. Have severe pain in your chest or abdomen. Vomit repeatedly. Have trouble breathing. These symptoms may be an emergency. Get help right away. Call 911. Do not wait to see if the symptoms will go away. Do not drive yourself to the hospital. Summary Hypertension is when the force of blood pumping through your arteries is too strong. If this condition is not controlled, it may put you at risk for serious complications. Your personal target blood pressure may vary depending on your medical conditions, your age, and other factors. For most people, a normal blood pressure is less than 120/80. Hypertension is treated with lifestyle changes, medicines, or a combination of both. Lifestyle changes include losing weight, eating a healthy,   low-sodium diet, exercising more, and limiting alcohol. This information is not intended to replace advice given to you by your health care provider. Make sure you discuss any questions you have with your health care provider. Document Revised: 06/06/2021 Document Reviewed: 06/06/2021 Elsevier Patient Education  2023 Elsevier Inc.  

## 2022-01-30 NOTE — Progress Notes (Signed)
Subjective:  Patient ID: Dustin Schneider, male    DOB: 1955/11/24  Age: 66 y.o. MRN: [id number]  CC: Hypertension, Atrial Fibrillation, and Hyperlipidemia   HPI Dustin Schneider presents for f/up -  He has no new neurological deficits. 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  Outpatient Medications Prior to Visit  Medication Sig Dispense Refill   baclofen (LIORESAL) 20 MG tablet Take 1 tablet (20 mg total) by mouth 3 (three) times daily. 60 tablet 2   diltiazem (CARDIZEM CD) 180 MG 24 hr capsule TAKE (1) CAPSULE DAILY. 30 capsule 6   dofetilide (TIKOSYN) 500 MCG capsule TAKE ONE CAPSULE BY MOUTH TWICE DAILY 180 capsule 3   ELIQUIS 5 MG TABS tablet Take 1 tablet (5 mg total) by mouth 2 (two) times daily. 60 tablet 6   rosuvastatin (CRESTOR) 10 MG tablet TAKE 1 TABLET BY MOUTH DAILY AFTER SUPPER 30 tablet 6   fluorouracil (EFUDEX) 5 % cream External for 14     tamsulosin (FLOMAX) 0.4 MG CAPS capsule Oral for 30     No facility-administered medications prior to visit.    ROS Review of Systems  Constitutional: Negative.  Negative for diaphoresis and fatigue.  HENT: Negative.    Eyes: Negative.   Respiratory:  Negative for cough, chest tightness, shortness of breath and wheezing.   Cardiovascular:  Negative for chest pain, palpitations and leg swelling.  Gastrointestinal:  Negative for abdominal pain, diarrhea and nausea.  Endocrine: Negative.   Genitourinary: Negative.  Negative for difficulty urinating and dysuria.  Musculoskeletal: Negative.   Skin: Negative.   Neurological:  Positive for speech difficulty and weakness. Negative for dizziness.  Psychiatric/Behavioral: Negative.      Objective:  BP 120/70 (BP Location: Left Arm, Patient Position: Sitting, Cuff Size: Large)   Pulse 60   Temp 98.2 F (36.8 C) (Oral)   Ht '5\' 8"'$  (1.727 m)   Wt 166 lb (75.3 kg)   SpO2 96%   BMI 25.24 kg/m   BP Readings from Last 3 Encounters:  01/30/22 120/70   01/02/22 138/79  12/25/21 131/78    Wt Readings from Last 3 Encounters:  01/30/22 166 lb (75.3 kg)  01/02/22 169 lb 6.4 oz (76.8 kg)  12/25/21 166 lb 6.4 oz (75.5 kg)    Physical Exam Vitals reviewed.  HENT:     Nose: Nose normal.     Mouth/Throat:     Mouth: Mucous membranes are moist.  Eyes:     General: No scleral icterus.    Conjunctiva/sclera: Conjunctivae normal.  Cardiovascular:     Rate and Rhythm: Normal rate and regular rhythm.     Heart sounds: No murmur heard. Pulmonary:     Effort: Pulmonary effort is normal.     Breath sounds: No stridor. No wheezing, rhonchi or rales.  Abdominal:     Palpations: There is no mass.     Tenderness: There is no abdominal tenderness. There is no guarding.     Hernia: No hernia is present.  Musculoskeletal:     Cervical back: Neck supple.     Right lower leg: No edema.     Left lower leg: No edema.  Lymphadenopathy:     Cervical: No cervical adenopathy.  Skin:    General: Skin is warm.  Neurological:     Mental Status: He is alert. Mental status is at baseline.     Motor: Weakness present.     Gait: Gait abnormal.  Deep Tendon Reflexes: Reflexes abnormal.  Psychiatric:        Mood and Affect: Mood normal.        Behavior: Behavior normal.     Lab Results  Component Value Date   WBC 5.5 01/30/2022   HGB 14.3 01/30/2022   HCT 42.8 01/30/2022   PLT 203.0 01/30/2022   GLUCOSE 111 (H) 01/30/2022   CHOL 130 01/30/2022   TRIG 100.0 01/30/2022   HDL 49.80 01/30/2022   LDLCALC 61 01/30/2022   ALT 18 01/30/2022   AST 14 01/30/2022   NA 140 01/30/2022   K 4.2 01/30/2022   CL 103 01/30/2022   CREATININE 0.91 01/30/2022   BUN 16 01/30/2022   CO2 29 01/30/2022   TSH 2.08 03/27/2021   PSA 6.12 (H) 01/30/2022   INR 1.3 (H) 03/11/2020   HGBA1C 5.6 01/28/2020    No results found.  Assessment & Plan:   Neng was seen today for hypertension, atrial fibrillation and hyperlipidemia.  Diagnoses and all orders for  this visit:  Essential hypertension- His blood pressure is well controlled. -     Basic metabolic panel; Future -     CBC with Differential/Platelet; Future -     Hepatic function panel; Future -     Hepatic function panel -     CBC with Differential/Platelet -     Basic metabolic panel  Benign prostatic hyperplasia without lower urinary tract symptoms -     PSA; Future -     PSA  PSA elevation -     PSA; Future -     PSA -     Ambulatory referral to Urology  Hyperlipidemia with target LDL less than 100- LDL goal achieved. Doing well on the statin  -     Lipid panel; Future -     Hepatic function panel; Future -     Hepatic function panel -     Lipid panel  Paroxysmal atrial fibrillation ([place])- He has good rate and rhythm control.  Screen for colon cancer -     Ambulatory referral to Gastroenterology   I have discontinued Dustin Schneider's tamsulosin and fluorouracil. I am also having him maintain his rosuvastatin, dofetilide, diltiazem, Eliquis, and baclofen.  No orders of the defined types were placed in this encounter.    Follow-up: No follow-ups on file.  Scarlette Calico, MD

## 2022-02-10 ENCOUNTER — Other Ambulatory Visit: Payer: Self-pay | Admitting: Physical Medicine & Rehabilitation

## 2022-02-20 ENCOUNTER — Encounter: Payer: Self-pay | Admitting: Physical Medicine & Rehabilitation

## 2022-02-20 ENCOUNTER — Encounter: Payer: Medicare Other | Attending: Physical Medicine & Rehabilitation | Admitting: Physical Medicine & Rehabilitation

## 2022-02-20 VITALS — BP 137/84 | HR 57 | Ht 68.0 in | Wt 161.0 lb

## 2022-02-20 DIAGNOSIS — G8111 Spastic hemiplegia affecting right dominant side: Secondary | ICD-10-CM | POA: Insufficient documentation

## 2022-02-20 NOTE — Progress Notes (Signed)
Subjective:    Patient ID: Dustin Schneider, male    DOB: January 12, 1956, 66 y.o.   MRN: [id number]  HPI 66 year old male with right spastic hemiplegia secondary left CVA, left M2 branch infarct 01/02/2022 botulinum toxin injection Xeomin/botulinum toxin injection. Plan for muscle group selection Right upper extremity FPL 25 units FDS 25 units FDP 25 units FCR 25 units     Right lower extremity Medial gastroc 50 units  FDL 75 units Posterior tibialis 75 units Pain Inventory Average Pain 0 Pain Right Now 0 My pain is  no pain  LOCATION OF PAIN  no pain  BOWEL Number of stools per week: 7   BLADDER Normal    Mobility use a cane how many minutes can you walk? 15 ability to climb steps?  yes do you drive?  no Do you have any goals in this area?  no  Function disabled: date disabled 02/2020 I need assistance with the following:  bathing  Neuro/Psych numbness tremor  Prior Studies Any changes since last visit?  no  Physicians involved in your care Any changes since last visit?  no   Family History  Problem Relation Age of Onset   Glaucoma Mother    Atrial fibrillation Father    Social History   Socioeconomic History   Marital status: Divorced    Spouse name: Not on file   Number of children: 1   Years of education: Not on file   Highest education level: Not on file  Occupational History   Not on file  Tobacco Use   Smoking status: Never   Smokeless tobacco: Never  Vaping Use   Vaping Use: Never used  Substance and Sexual Activity   Alcohol use: Not Currently   Drug use: No   Sexual activity: Not on file    Comment: DIVORCE  Other Topics Concern   Not on file  Social History Narrative   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 [place] Determinants of Health   Financial Resource Strain: Not on file  Food Insecurity: Not on file  Transportation Needs: Not on file  Physical Activity: Not on file  Stress: Not on  file  Social Connections: Not on file   Past Surgical History:  Procedure Laterality Date   BUBBLE STUDY  03/11/2020   Procedure: BUBBLE STUDY;  Surgeon: [name], MD;  Location: [place];  Service: Cardiovascular;;   CARDIOVERSION N/A 05/13/2018   Procedure: CARDIOVERSION;  Surgeon: [name], MD;  Location: [place];  Service: Cardiovascular;  Laterality: N/A;   CARDIOVERSION N/A 06/13/2018   Procedure: CARDIOVERSION;  Surgeon: [name], MD;  Location[facility] ENDOSCOPY;  Service: Cardiovascular;  Laterality: N/A;   CARDIOVERSION N/A 06/23/2018   Procedure: CARDIOVERSION;  Surgeon: [name], MD;  Location: [place];  Service: Cardiovascular;  Laterality: N/A;   CARDIOVERSION N/A 03/11/2020   Procedure: CARDIOVERSION;  Surgeon: [name], MD;  Location: [place];  Service: Cardiovascular;  Laterality: N/A;   excision of actinic keratosis     EYE SURGERY     eye lift   IR CT HEAD LTD  01/27/2020   IR PERCUTANEOUS ART THROMBECTOMY/INFUSION INTRACRANIAL INC DIAG ANGIO  01/27/2020       IR PERCUTANEOUS ART THROMBECTOMY/INFUSION INTRACRANIAL INC DIAG ANGIO  01/27/2020   RADIOLOGY WITH ANESTHESIA N/A 01/27/2020   Procedure: IR WITH ANESTHESIA;  Surgeon: Radiologist, Medication, MD;  Location: [place];  Service: Radiology;  Laterality: N/A;  TEE WITHOUT CARDIOVERSION N/A 03/11/2020   Procedure: TRANSESOPHAGEAL ECHOCARDIOGRAM (TEE);  Surgeon: [name], MD;  Location: [facility] ENDOSCOPY;  Service: Cardiovascular;  Laterality: N/A;   TONSILLECTOMY     WISDOM TOOTH EXTRACTION     Past Medical History:  Diagnosis Date   Hypertension    NICM (nonischemic cardiomyopathy) ([place]) 06/15/2018   04/23/18: EF 45%, 09/15/18: NOrmal LVEF   Paroxysmal atrial fibrillation ([place]) 06/15/2018   Stroke ([place])    Visit for monitoring Tikosyn therapy 06/12/2018   BP 137/84   Pulse (!) 57   Ht '5\' 8"'$  (1.727 m)   Wt 161 lb (73 kg)   SpO2 92%   BMI 24.48 kg/m   Opioid Risk Score:   Fall Risk Score:   `1  Depression screen PHQ 2/9     02/20/2022    2:42 PM 01/02/2022    2:40 PM 11/14/2021    3:04 PM 07/18/2021    3:08 PM 07/18/2021    2:53 PM 04/18/2021    2:13 PM 06/21/2020   11:30 AM  Depression screen PHQ 2/9  Decreased Interest 0 0 0 0 0 0 0  Down, Depressed, Hopeless 0 0 0 0 0 0 0  PHQ - 2 Score 0 0 0 0 0 0 0     Review of Systems  Constitutional: Negative.   HENT: Negative.    Eyes: Negative.   Respiratory: Negative.    Cardiovascular: Negative.   Gastrointestinal: Negative.   Endocrine: Negative.   Genitourinary: Negative.   Musculoskeletal:  Positive for gait problem.  Skin: Negative.   Allergic/Immunologic: Negative.   Neurological:  Positive for numbness.  Hematological: Negative.   Psychiatric/Behavioral: Negative.        Objective:   Physical Exam  Tone [name] 1 at the right finger flexors thumb flexor and wrist flexors [name] 1 at the elbow flexors.  Right lower extremity Clonus at the plantar flexors on the right. With standing there is some foot inversion as well as great toe clawing digits 2 through 5 appear relatively relaxed Plantarflexion with tendency towards genu recurvatum     Assessment & Plan:  1.  Left MCA infarct with right spastic hemiplegia will make following adjustments to botulinum toxin injection.  He does get good relief with the upper extremity with lower extremity he does need some additional dosing.  Will increase to 400 units total FPL 25 units FDS 25 units FDP 25 units FCR 25 units     Right lower extremity Medial gastroc 100units FHL 50 units FDL 75 units Posterior tibialis 75 units

## 2022-02-22 ENCOUNTER — Telehealth: Payer: Self-pay | Admitting: Internal Medicine

## 2022-02-22 NOTE — Telephone Encounter (Signed)
Trish calls today from [facility] in regards to [name] upcoming full body check. PT is currently scheduled for an appointment on August 3rd for 8 am. They have received a call from a friend of the PT who is currently driving for them (after [name] recent stroke) that is stating they will need to R/S this appointment to a date that allows for an afternoon appointment.  This person is not on either our DPR or [facility]'s DPR and so they can not honor this R/S due to [employer].   Trish could not verify with me the name of the person calling for the PT and is currently trying to find a number for them. Wannetta Sender states she will call back once she has this information and will direct this individual to our office to get them on a new DPR.  FYI the referral was made by Dr.Burns but PT is seeing Dr.Jones so I was not sure who to route this message to.

## 2022-04-03 ENCOUNTER — Encounter: Payer: Medicare Other | Attending: Physical Medicine & Rehabilitation | Admitting: Physical Medicine & Rehabilitation

## 2022-04-03 ENCOUNTER — Encounter: Payer: Self-pay | Admitting: Physical Medicine & Rehabilitation

## 2022-04-03 VITALS — BP 115/77 | HR 59 | Ht 68.0 in | Wt 165.0 lb

## 2022-04-03 DIAGNOSIS — G8111 Spastic hemiplegia affecting right dominant side: Secondary | ICD-10-CM | POA: Diagnosis present

## 2022-04-03 MED ORDER — ONABOTULINUMTOXINA 100 UNITS IJ SOLR: 400.0000 [IU] | Freq: Once | INTRAMUSCULAR | Status: DC

## 2022-04-03 NOTE — Patient Instructions (Signed)

## 2022-04-03 NOTE — Progress Notes (Signed)
Botox Injection for spasticity using needle EMG guidance  Dilution: 50 Units/ml Indication: Severe spasticity which interferes with ADL,mobility and/or  hygiene and is unresponsive to medication management and other conservative care Informed consent was obtained after describing risks and benefits of the procedure with the patient. This includes bleeding, bruising, infection, excessive weakness, or medication side effects. A REMS form is on file and signed. Needle: 27g 1"  needle electrode Number of units per muscle FPL 25 units FDS 25 units FDP 25 units FCR 25 units   Right lower extremity Medial gastroc 100units FHL 50 units FDL 75 units Posterior tibialis 75 units  All injections were done after obtaining appropriate EMG activity and after negative drawback for blood. The patient tolerated the procedure well. Post procedure instructions were given. A followup appointment was made.

## 2022-04-19 ENCOUNTER — Other Ambulatory Visit: Payer: Self-pay | Admitting: Physical Medicine & Rehabilitation

## 2022-04-19 ENCOUNTER — Other Ambulatory Visit: Payer: Self-pay | Admitting: Cardiology

## 2022-04-23 ENCOUNTER — Other Ambulatory Visit: Payer: Self-pay | Admitting: Cardiology

## 2022-05-29 ENCOUNTER — Encounter: Payer: Self-pay | Admitting: Physical Medicine & Rehabilitation

## 2022-05-29 ENCOUNTER — Encounter: Payer: Medicare Other | Attending: Physical Medicine & Rehabilitation | Admitting: Physical Medicine & Rehabilitation

## 2022-05-29 VITALS — BP 112/82 | HR 79 | Ht 68.0 in | Wt 162.8 lb

## 2022-05-29 DIAGNOSIS — G8111 Spastic hemiplegia affecting right dominant side: Secondary | ICD-10-CM | POA: Insufficient documentation

## 2022-05-29 NOTE — Progress Notes (Signed)
Subjective:    Patient ID: Dustin Schneider, male    DOB: 1956/07/11, 66 y.o.   MRN: [id number]  66 y.o. male with history of PAF/on Xarelto, and ICM; who was admitted on 01/27/2020 after collapsing at [facility] hardware. He was found to have right-sided weakness with left gaze preference as well as nausea and vomiting. CT head at admission revealed hyperdense sign left M2 branch compatible with acute thrombus and he underwent cerebral angio with complete recanalization of left-MCA and ACA. 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 He was maintained on aspirin given IR petechial hemorrhage and transition to Eliquis by 06/22. He was kept NPO till discharge due to  signs of dysphagia with risk of aspiration and coretak placed for nutritional support.  HPI  Patient remains aphasic, he is accompanied by a friend.  The patient is a former Development worker, community The patient indicates that his lower extremity spasticity did improve somewhat after the botulinum toxin injection increase dose to the right lower extremity.  He still has shaking in the right upper extremity which is uncontrolled.  Botox 04/03/22 Number of units per muscle FPL 25 units FDS 25 units FDP 25 units FCR 25 units   Right lower extremity Medial gastroc 100units FHL 50 units FDL 75 units Posterior tibialis 75 units   Pain Inventory Average Pain 0 Pain Right Now 0 My pain is  no pain  In the last 24 hours, has pain interfered with the following? General activity 0 Relation with others 0 Enjoyment of life 0 What TIME of day is your pain at its worst? No pain Sleep (in general) NA  Pain is worse with:  no pain Pain improves with:  no pain Relief from Meds:  no pain  Family History  Problem Relation Age of Onset   Glaucoma Mother    Atrial fibrillation Father    Social History   Socioeconomic  History   Marital status: Divorced    Spouse name: Not on file   Number of children: 1   Years of education: Not on file   Highest education level: Not on file  Occupational History   Not on file  Tobacco Use   Smoking status: Never   Smokeless tobacco: Never  Vaping Use   Vaping Use: Never used  Substance and Sexual Activity   Alcohol use: Not Currently   Drug use: No   Sexual activity: Not on file    Comment: DIVORCE  Other Topics Concern   Not on file  Social History Narrative   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 [place] Determinants of Health   Financial Resource Strain: Not on file  Food Insecurity: Not on file  Transportation Needs: Not on file  Physical Activity: Not on file  Stress: Not on file  Social Connections: Not on file   Past Surgical History:  Procedure Laterality Date   BUBBLE STUDY  03/11/2020   Procedure: BUBBLE STUDY;  Surgeon: [name], MD;  Location: [name];  Service: Cardiovascular;;   CARDIOVERSION N/A 05/13/2018   Procedure: CARDIOVERSION;  Surgeon: [name], MD;  Location: [facility] ENDOSCOPY;  Service: Cardiovascular;  Laterality: N/A;   CARDIOVERSION N/A 06/13/2018   Procedure: CARDIOVERSION;  Surgeon: [name], MD;  Location: [facility] ENDOSCOPY;  Service: Cardiovascular;  Laterality: N/A;   CARDIOVERSION N/A 06/23/2018   Procedure: CARDIOVERSION;  Surgeon: [name],  Ulice Dash, MD;  Location: [place];  Service: Cardiovascular;  Laterality: N/A;   CARDIOVERSION N/A 03/11/2020   Procedure: CARDIOVERSION;  Surgeon: [name], MD;  Location: [place];  Service: Cardiovascular;  Laterality: N/A;   excision of actinic keratosis     EYE SURGERY     eye lift   IR CT HEAD LTD  01/27/2020   IR PERCUTANEOUS ART THROMBECTOMY/INFUSION INTRACRANIAL INC DIAG ANGIO  01/27/2020       IR PERCUTANEOUS ART THROMBECTOMY/INFUSION INTRACRANIAL INC DIAG ANGIO  01/27/2020   RADIOLOGY WITH ANESTHESIA N/A 01/27/2020   Procedure: IR WITH  ANESTHESIA;  Surgeon: Radiologist, Medication, MD;  Location: [place];  Service: Radiology;  Laterality: N/A;   TEE WITHOUT CARDIOVERSION N/A 03/11/2020   Procedure: TRANSESOPHAGEAL ECHOCARDIOGRAM (TEE);  Surgeon: [name], MD;  Location: [place];  Service: Cardiovascular;  Laterality: N/A;   TONSILLECTOMY     WISDOM TOOTH EXTRACTION     Past Surgical History:  Procedure Laterality Date   BUBBLE STUDY  03/11/2020   Procedure: BUBBLE STUDY;  Surgeon: [name], MD;  Location: [place];  Service: Cardiovascular;;   CARDIOVERSION N/A 05/13/2018   Procedure: CARDIOVERSION;  Surgeon: [name], MD;  Location: [place];  Service: Cardiovascular;  Laterality: N/A;   CARDIOVERSION N/A 06/13/2018   Procedure: CARDIOVERSION;  Surgeon: [name], MD;  Location: [facility] ENDOSCOPY;  Service: Cardiovascular;  Laterality: N/A;   CARDIOVERSION N/A 06/23/2018   Procedure: CARDIOVERSION;  Surgeon: [name], MD;  Location: [place];  Service: Cardiovascular;  Laterality: N/A;   CARDIOVERSION N/A 03/11/2020   Procedure: CARDIOVERSION;  Surgeon: [name], MD;  Location: [place];  Service: Cardiovascular;  Laterality: N/A;   excision of actinic keratosis     EYE SURGERY     eye lift   IR CT HEAD LTD  01/27/2020   IR PERCUTANEOUS ART THROMBECTOMY/INFUSION INTRACRANIAL INC DIAG ANGIO  01/27/2020       IR PERCUTANEOUS ART THROMBECTOMY/INFUSION INTRACRANIAL INC DIAG ANGIO  01/27/2020   RADIOLOGY WITH ANESTHESIA N/A 01/27/2020   Procedure: IR WITH ANESTHESIA;  Surgeon: Radiologist, Medication, MD;  Location: [place];  Service: Radiology;  Laterality: N/A;   TEE WITHOUT CARDIOVERSION N/A 03/11/2020   Procedure: TRANSESOPHAGEAL ECHOCARDIOGRAM (TEE);  Surgeon: [name], MD;  Location: [facility] ENDOSCOPY;  Service: Cardiovascular;  Laterality: N/A;   TONSILLECTOMY     WISDOM TOOTH EXTRACTION     Past Medical History:  Diagnosis Date   Allergy    Hypertension    NICM (nonischemic cardiomyopathy)  ([place]) 06/15/2018   04/23/18: EF 45%, 09/15/18: NOrmal LVEF   Paroxysmal atrial fibrillation ([place]) 06/15/2018   Stroke ([place])    Visit for monitoring Tikosyn therapy 06/12/2018   BP 112/82   Pulse 79   Ht '5\' 8"'$  (1.727 m)   Wt 162 lb 12.8 oz (73.8 kg)   SpO2 93%   BMI 24.75 kg/m   Opioid Risk Score:   Fall Risk Score:  `1  Depression screen PHQ 2/9     05/29/2022    2:51 PM 04/03/2022    2:21 PM 02/20/2022    2:42 PM 01/02/2022    2:40 PM 11/14/2021    3:04 PM 07/18/2021    3:08 PM 07/18/2021    2:53 PM  Depression screen PHQ 2/9  Decreased Interest 0 0 0 0 0 0 0  Down, Depressed, Hopeless 0 0 0 0 0 0 0  PHQ - 2 Score 0 0 0 0 0 0 0  Review of Systems  Constitutional: Negative.   HENT: Negative.    Eyes: Negative.   Respiratory: Negative.    Cardiovascular: Negative.   Gastrointestinal: Negative.   Endocrine: Negative.   Genitourinary: Negative.   Musculoskeletal:  Positive for gait problem.  Skin:        Small amount of bruising-- he decreased his Apixaban by 1/2 tablet as his decision -- review with MD  Allergic/Immunologic: Negative.   Hematological:  Bruises/bleeds easily.  Psychiatric/Behavioral: Negative.    All other systems reviewed and are negative.      Objective:   Physical Exam  Patient with sustained clonus right finger and wrist flexors when attempting to extend the arm. Right lower extremity with standing goes into severe equinus Right upper extremity strength is 3 - in the deltoid bicep tricep finger flexors and 2 - in the extensors Right lower extremity 4/5 in the knee extensors 3 - ankle dorsiflexor but with inverter tone Ankle plantar flexor trace     Assessment & Plan:  1.  Left MCA distribution infarct approximately 2 years ago with residual expressive aphasia as well as right spastic hemiplegia.  Getting benefit from botulinum toxin however still having strong inverter spasticity right lower extremity.  We discussed treatment options, in  order to increase dose to the right upper extremity can obtain the clonus, would need to reduce dose of botulinum toxin in the lower extremity.  Would recommend instead to use phenol to the right tibial nerve and then the use 200 units of Botox into the right upper extremity. Botox 04/03/22 Number of units per muscle FPL 50 units FDS 50 units FDP 50 units FCR 50 units   Right lower extremity Phenol neurolysis 5% 5 mL  2.  His friend indicated that the patient's has started cutting the p.m. dose of Eliquis in half we discussed that that may increase his risk for another stroke given his history of atrial fibrillation.  He states he did it because he had excessive bleeding when he bumped his arm on something.  I have sent a message to his cardiologist regarding this.

## 2022-06-01 ENCOUNTER — Ambulatory Visit: Payer: Medicare Other | Admitting: Cardiology

## 2022-06-01 ENCOUNTER — Encounter: Payer: Self-pay | Admitting: Cardiology

## 2022-06-01 VITALS — BP 139/75 | HR 92 | Temp 98.0°F | Resp 16 | Ht 68.0 in | Wt 164.0 lb

## 2022-06-01 DIAGNOSIS — I480 Paroxysmal atrial fibrillation: Secondary | ICD-10-CM

## 2022-06-01 DIAGNOSIS — Z8673 Personal history of transient ischemic attack (TIA), and cerebral infarction without residual deficits: Secondary | ICD-10-CM

## 2022-06-01 DIAGNOSIS — R0683 Snoring: Secondary | ICD-10-CM

## 2022-06-01 DIAGNOSIS — I10 Essential (primary) hypertension: Secondary | ICD-10-CM

## 2022-06-01 NOTE — Progress Notes (Signed)
Primary Physician/Referring:  [name], MD  Patient ID: [name], male    DOB: 16-Feb-1956, 66 y.o.   MRN: [id number]  Chief Complaint  Patient presents with   Atrial Fibrillation   Follow-up   HPI:    LEEVI CULLARS  is a 66 y.o.  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  He is presently on Tikosyn and last cardioversion was sometime in 2021, TEE guided.  Patient has family history of atrial fibrillation, his father and also his brother.  I was messaged by Dr. Alysia Penna regarding patient taking half the dose of the Eliquis.  I brought him in to be seen to discuss anticoagulation issues.  He is accompanied by his girlfriend [place].  Denies chest pain, palpitations, syncope, dizziness.  He has had no known recurrence of atrial fibrillation and continues to tolerate anticoagulation without bleeding diathesis.    Past Medical History:  Diagnosis Date   Allergy    Hypertension    NICM (nonischemic cardiomyopathy) ([place]) 06/15/2018   04/23/18: EF 45%, 09/15/18: NOrmal LVEF   Paroxysmal atrial fibrillation ([place]) 06/15/2018   Stroke [facility])    Visit for monitoring Tikosyn therapy 06/12/2018    Social History   Tobacco Use   Smoking status: Some Days    Types: Cigarettes, Cigars   Smokeless tobacco: Never   Tobacco comments:    Once a year he will smoke a cigars   Substance Use Topics   Alcohol use: Not Currently   Marital Status: Divorced  ROS  Review of Systems  Cardiovascular:  Negative for chest pain, dyspnea on exertion and leg swelling.  Neurological:  Positive for focal weakness (right  hemiplegia).   Objective  Blood pressure 139/75, pulse 92, temperature 98 F (36.7 C), temperature source Temporal, resp. rate 16, height '5\' 8"'$  (1.727 m), weight 164 lb (74.4 kg), SpO2 96 %.     06/01/2022    9:26 AM 05/29/2022    2:48 PM 04/03/2022    2:16 PM  Vitals with BMI  Height '5\' 8"'$  '5\' 8"'$  '5\' 8"'$   Weight 164 lbs 162 lbs 13 oz 165 lbs  BMI 24.94 88.50 27.74  Systolic 128 786 767  Diastolic 75 82 77  Pulse 92 79 59   Physical Exam Neck:     Vascular: No carotid bruit or JVD.  Cardiovascular:     Rate and Rhythm: Tachycardia present. Rhythm irregular.     Pulses: Intact distal pulses.     Heart sounds: Normal heart sounds. No murmur heard.    No gallop.  Pulmonary:     Effort: Pulmonary effort is normal.     Breath sounds: Normal breath sounds.  Abdominal:     General: Bowel sounds are normal.     Palpations: Abdomen is soft.  Musculoskeletal:     Right lower leg: No edema.     Left lower leg: No edema.     Comments: Right hemiplegia/hemiparesis and right foot drop      Lab Results  Component Value Date   NA 140 01/30/2022   K 4.2 01/30/2022   CO2 29 01/30/2022   GLUCOSE 111 (H) 01/30/2022   BUN 16 01/30/2022   CREATININE  0.91 01/30/2022   CALCIUM 9.8 01/30/2022   GFRNONAA >60 03/11/2020   Lab Results  Component Value Date   ALT 18 01/30/2022   AST 14 01/30/2022   ALKPHOS 55 01/30/2022   BILITOT 0.6 01/30/2022      Latest Ref Rng & Units 01/30/2022    3:00 PM 03/27/2021    2:00 PM 03/09/2020    6:56 AM  CBC  WBC 4.0 - 10.5 K/uL 5.5  7.3  5.7   Hemoglobin 13.0 - 17.0 g/dL 14.3  14.9  11.3   Hematocrit 39.0 - 52.0 % 42.8  45.3  34.7   Platelets 150.0 - 400.0 K/uL 203.0  215.0  234    Lipid Panel Recent Labs    01/30/22 1500  CHOL 130  TRIG 100.0  LDLCALC 61  VLDL 20.0  HDL 49.80  CHOLHDL 3    HEMOGLOBIN A1C Lab Results  Component Value Date   HGBA1C 5.6 01/28/2020   MPG 114 01/28/2020    Lab Results  Component Value Date   TSH 2.08  03/27/2021    Allergies  No Known Allergies    Current Outpatient Medications:    baclofen (LIORESAL) 20 MG tablet, Take 1 tablet (20 mg total) by mouth 3 (three) times daily., Disp: 60 tablet, Rfl: 2   diltiazem (CARDIZEM CD) 180 MG 24 hr capsule, TAKE ONE CAPSULE BY MOUTH DAILY, Disp: 30 capsule, Rfl: 6   dofetilide (TIKOSYN) 500 MCG capsule, TAKE ONE CAPSULE BY MOUTH TWICE DAILY, Disp: 180 capsule, Rfl: 3   ELIQUIS 5 MG TABS tablet, Take 1 tablet (5 mg total) by mouth 2 (two) times daily., Disp: 60 tablet, Rfl: 6   rosuvastatin (CRESTOR) 10 MG tablet, TAKE 1 TABLET BY MOUTH DAILY AFTER SUPPER, Disp: 30 tablet, Rfl: 6  Current Facility-Administered Medications:    botulinum toxin Type A (BOTOX) injection 400 Units, 400 Units, Intramuscular, Once, Kirsteins, Luanna Salk, MD  Radiology:   No results found.  Cardiac Studies:   Nuclear stress test  05/05/2018: 1. The resting electrocardiogram demonstrated atrial fibrillation. The stress electrocardiogram was positive for ischemia with 2 mm downsloping ST depression in the inferior and lateral leads, persisted for 2 mintues into recovery. Stress symptoms included palpitations and fatigue. Patient exercised on Bruce protocol for 6:00 minutes and achieved 7.05 METS. Stress test terminated due to fatigue and 113% MPHR achieved (Target HR >85%). 2. Left ventricular cavity is noted to be normal on the rest and stress studies. SPECT images demonstrate homogeneous tracer distribution throughout the myocardium. The left ventricular ejection fraction was calculated to be 38%, but visually appears to be at least low normal without wall motion abnormality. This is an intermediate risk study due to abnormal ST change and low LVEF (difficult in view of gating with A. Fib), clinical correlation recommended.  Echocardiogram 01/28/2020:  1. The left ventricle has normal function. The left ventricle has no regional wall motion abnormalities. Left ventricular  diastolic parameters are  consistent with Grade II diastolic dysfunction (pseudonormalization).   2. Right ventricular systolic function is normal. The right ventricular size is normal. There is mildly elevated pulmonary artery systolic pressure.   3. Left atrial size was moderately dilated.   4. Right atrial size was moderately dilated.   5. The mitral valve is normal in structure. Mild mitral valve regurgitation.   6. The aortic valve is normal in structure. Aortic valve regurgitation is mild to moderate.   7. The inferior vena cava is dilated in size with >50%  respiratory variability, suggesting right atrial pressure of 8 mmHg.  TEE Echocardiogram 03/11/2020:   1. Left ventricular ejection fraction, by estimation, is 60 to 65%. The left ventricle has normal function. The left ventricle has no regional wall motion abnormalities.   2. Right ventricular systolic function is normal. The right ventricular size is normal.   3. No left atrial/left atrial appendage thrombus was detected.   4. The mitral valve is normal in structure. Mild mitral valve regurgitation.   5. The aortic valve is tricuspid. Aortic valve regurgitation is mild.   6. Agitated saline contrast bubble study was negative, with no evidence of any interatrial shunt.   EKG   EKG 06/01/2022: Atrial fibrillation with controlled ventricular response at the rate of 81 bpm, normal axis, diffuse nonspecific ST-T abnormality.  Compared to 12/25/2021, atrial fibrillation has replaced sinus rhythm/sinus bradycardia.  Assessment     ICD-10-CM   1. Paroxysmal atrial fibrillation (HCC)  I48.0 EKG 12-Lead    Ambulatory referral to Sleep Studies    PCV ECHOCARDIOGRAM COMPLETE    2. Essential hypertension  I10     3. History of left MCA/ACA stroke with clot removal and right-sided residual hemiparesis and expressive aphasia  Z86.73 Ambulatory referral to Sleep Studies    4. Snoring  R06.83 Ambulatory referral to Sleep Studies       No  orders of the defined types were placed in this encounter.   There are no discontinued medications.   This patients CHA2DS2-VASc Score 4 (Age, stroke, HTN) and yearly risk of stroke 4.8%.   Recommendations:   JONAVEN HILGERS  is a 66 y.o. 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  He is presently on Tikosyn and last cardioversion was sometime in 2021, TEE guided.  Patient has family history of atrial fibrillation, his father and also his brother.  I was messaged by Dr. Alysia Penna regarding patient taking half the dose of the Eliquis.  I brought him in to be seen to discuss anticoagulation issues.  He is accompanied by his girlfriend [place]. He remains asymptomatic and denies dyspnea, palpitations, dizziness or syncope.  However patient is today in atrial fibrillation with relatively controlled heart rate.  I had a long discussion with the patient regarding risk of recurrence of stroke as he was also noncompliant previously.  Patient now states that he will be continuing with full dose, he was having mild bruising and had decided to reduce the dose.  Otherwise he has been very compliant with all his medications.  With regard to A-fib, would like to bring him back in 1 week for repeat EKG, if he is still in atrial fibrillation, we will set him up for direct-current cardioversion.  Patient also has history of diastolic heart failure and this can precipitate worsening heart failure and also potential for other complications related to atrial fibrillation if it persists.  For now continue with dofetilide.  If he indeed needs  cardioversion, will check labs along with magnesium levels.  I will schedule him for repeat echocardiogram.  Blood pressure is well controlled.  No clinical evidence of heart failure.  Patient is a good candidate for OCEANIC-AF (Asundexian - factor XIa inhibitor PO BID vs Apixaban  PO BID in patients with A. Fib for stroke prevention.  Patient is willing to participate.  Patient has loud snoring, I also suspect he may have developed central sleep apnea in view of stroke, will refer him for sleep study.   [name], MD, [facility] 06/01/2022, 10:22 AM Office: [phone] Fax: [phone] Pager: [phone]

## 2022-06-08 ENCOUNTER — Ambulatory Visit: Payer: Medicare Other

## 2022-06-08 VITALS — BP 134/91 | HR 61 | Temp 97.8°F | Resp 16 | Ht 68.0 in | Wt 166.6 lb

## 2022-06-08 DIAGNOSIS — I480 Paroxysmal atrial fibrillation: Secondary | ICD-10-CM

## 2022-06-08 NOTE — Progress Notes (Signed)
Primary Physician/Referring:  [name], MD  Patient ID: Dustin Schneider, male    DOB: 1955-10-13, 66 y.o.   MRN: [id number]  Chief Complaint  Patient presents with   Follow-up    1 week   Atrial Fibrillation   HPI:    Dustin Schneider  is a 66 y.o.  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  He is presently on Tikosyn and last cardioversion was sometime in 2021, TEE guided.  Patient has family history of atrial fibrillation, his father and also his brother.  He presents today for 1 week follow-up for A-fib.  Overall he is doing well without any complaints. Denies chest pain, palpitations, syncope, dizziness.  Past Medical History:  Diagnosis Date   Allergy    Hypertension    NICM (nonischemic cardiomyopathy) ([place]) 06/15/2018   04/23/18: EF 45%, 09/15/18: NOrmal LVEF   Paroxysmal atrial fibrillation ([place]) 06/15/2018   Stroke [facility])    Visit for monitoring Tikosyn therapy 06/12/2018    Social History   Tobacco Use   Smoking status: Some Days    Types: Cigarettes, Cigars   Smokeless tobacco: Never   Tobacco comments:    Once a year he will smoke a cigars   Substance Use Topics   Alcohol use: Not Currently   Marital Status: Divorced  ROS  Review of Systems  Cardiovascular:  Negative for chest pain, dyspnea on exertion, irregular heartbeat, leg swelling and syncope.  Respiratory:  Negative for shortness of breath.   Neurological:  Negative for dizziness and light-headedness.   Objective  Blood pressure (!) 134/91, pulse 61, temperature 97.8 F (36.6 C), temperature source Temporal, resp. rate 16,  height '5\' 8"'$  (1.727 m), weight 166 lb 9.6 oz (75.6 kg), SpO2 96 %.     06/08/2022    9:52 AM 06/01/2022    9:26 AM 05/29/2022    2:48 PM  Vitals with BMI  Height '5\' 8"'$  '5\' 8"'$  '5\' 8"'$   Weight 166 lbs 10 oz 164 lbs 162 lbs 13 oz  BMI 25.34 50.93 26.71  Systolic 245 809 983  Diastolic 91 75 82  Pulse 61 92 79   Physical Exam Neck:     Vascular: No carotid bruit or JVD.  Cardiovascular:     Rate and Rhythm: Normal rate and regular rhythm.     Pulses: Intact distal pulses.     Heart sounds: Normal heart sounds. No murmur heard.    No gallop.  Pulmonary:     Effort: Pulmonary effort is normal.     Breath sounds: Normal breath sounds.  Abdominal:     General: Bowel sounds are normal.     Palpations: Abdomen is soft.  Musculoskeletal:     Right lower leg: No edema.     Left lower leg: No edema.     Comments: Right hemiplegia/hemiparesis and right foot drop    Lab Results  Component Value Date   NA 140 01/30/2022   K 4.2 01/30/2022   CO2 29 01/30/2022   GLUCOSE 111 (H) 01/30/2022   BUN 16 01/30/2022   CREATININE 0.91 01/30/2022   CALCIUM 9.8 01/30/2022   GFRNONAA >60 03/11/2020   Lab Results  Component Value  Date   ALT 18 01/30/2022   AST 14 01/30/2022   ALKPHOS 55 01/30/2022   BILITOT 0.6 01/30/2022      Latest Ref Rng & Units 01/30/2022    3:00 PM 03/27/2021    2:00 PM 03/09/2020    6:56 AM  CBC  WBC 4.0 - 10.5 K/uL 5.5  7.3  5.7   Hemoglobin 13.0 - 17.0 g/dL 14.3  14.9  11.3   Hematocrit 39.0 - 52.0 % 42.8  45.3  34.7   Platelets 150.0 - 400.0 K/uL 203.0  215.0  234    Lipid Panel Recent Labs    01/30/22 1500  CHOL 130  TRIG 100.0  LDLCALC 61  VLDL 20.0  HDL 49.80  CHOLHDL 3    HEMOGLOBIN A1C Lab Results  Component Value Date   HGBA1C 5.6 01/28/2020   MPG 114 01/28/2020    Lab Results  Component Value Date   TSH 2.08 03/27/2021    Allergies  No Known Allergies    Current Outpatient Medications:    baclofen (LIORESAL) 20 MG tablet, Take 1  tablet (20 mg total) by mouth 3 (three) times daily., Disp: 60 tablet, Rfl: 2   diltiazem (CARDIZEM CD) 180 MG 24 hr capsule, TAKE ONE CAPSULE BY MOUTH DAILY, Disp: 30 capsule, Rfl: 6   dofetilide (TIKOSYN) 500 MCG capsule, TAKE ONE CAPSULE BY MOUTH TWICE DAILY, Disp: 180 capsule, Rfl: 3   ELIQUIS 5 MG TABS tablet, Take 1 tablet (5 mg total) by mouth 2 (two) times daily., Disp: 60 tablet, Rfl: 6   rosuvastatin (CRESTOR) 10 MG tablet, TAKE 1 TABLET BY MOUTH DAILY AFTER SUPPER, Disp: 30 tablet, Rfl: 6  Current Facility-Administered Medications:    botulinum toxin Type A (BOTOX) injection 400 Units, 400 Units, Intramuscular, Once, Kirsteins, Luanna Salk, MD  Radiology:   No results found.  Cardiac Studies:   Nuclear stress test  05/05/2018: 1. The resting electrocardiogram demonstrated atrial fibrillation. The stress electrocardiogram was positive for ischemia with 2 mm downsloping ST depression in the inferior and lateral leads, persisted for 2 mintues into recovery. Stress symptoms included palpitations and fatigue. Patient exercised on Bruce protocol for 6:00 minutes and achieved 7.05 METS. Stress test terminated due to fatigue and 113% MPHR achieved (Target HR >85%). 2. Left ventricular cavity is noted to be normal on the rest and stress studies. SPECT images demonstrate homogeneous tracer distribution throughout the myocardium. The left ventricular ejection fraction was calculated to be 38%, but visually appears to be at least low normal without wall motion abnormality. This is an intermediate risk study due to abnormal ST change and low LVEF (difficult in view of gating with A. Fib), clinical correlation recommended.  Echocardiogram 01/28/2020:  1. The left ventricle has normal function. The left ventricle has no regional wall motion abnormalities. Left ventricular diastolic parameters are  consistent with Grade II diastolic dysfunction (pseudonormalization).   2. Right ventricular systolic  function is normal. The right ventricular size is normal. There is mildly elevated pulmonary artery systolic pressure.   3. Left atrial size was moderately dilated.   4. Right atrial size was moderately dilated.   5. The mitral valve is normal in structure. Mild mitral valve regurgitation.   6. The aortic valve is normal in structure. Aortic valve regurgitation is mild to moderate.   7. The inferior vena cava is dilated in size with >50% respiratory variability, suggesting right atrial pressure of 8 mmHg.  TEE Echocardiogram 03/11/2020:   1. Left ventricular ejection  fraction, by estimation, is 60 to 65%. The left ventricle has normal function. The left ventricle has no regional wall motion abnormalities.   2. Right ventricular systolic function is normal. The right ventricular size is normal.   3. No left atrial/left atrial appendage thrombus was detected.   4. The mitral valve is normal in structure. Mild mitral valve regurgitation.   5. The aortic valve is tricuspid. Aortic valve regurgitation is mild.   6. Agitated saline contrast bubble study was negative, with no evidence of any interatrial shunt.   EKG   EKG 06/08/2022: Sinus rhythm with first-degree AV block.  Occasional PAC.  Normal axis.  Left anterior fascicular block.  Compared to previous EKG on 06/01/2022, sinus bradycardia replaces A-fib.  Assessment     ICD-10-CM   1. Paroxysmal atrial fibrillation (HCC)  I48.0 EKG 12-Lead        No orders of the defined types were placed in this encounter.   There are no discontinued medications.   This patients CHA2DS2-VASc Score 4 (Age, stroke, HTN) and yearly risk of stroke 4.8%.   Recommendations:   Dustin Schneider  is a 66 y.o. 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  He is presently on Tikosyn and last cardioversion was sometime in 2021, TEE guided.  Patient has family history of atrial fibrillation, his father and also his brother.  Paroxysmal atrial fibrillation (HCC) EKG at today's visit reveals sinus rhythm. Continue Tikosyn 500 mg twice daily. He is taking Eliquis as ordered without diathesis. He is currently scheduled for echocardiogram home 07/02/2022. At last visit he was referred for sleep study given loud snoring.  Patient is a good candidate for OCEANIC-AF (Asundexian - factor XIa inhibitor PO BID vs Apixaban PO BID in patients with A. Fib for stroke prevention.  Patient is willing to participate.  We will set up visit for initiation.  Overall, he is doing well from cardiac standpoint.  Follow-up at next scheduled visit.   Ernst Spell, AGNP-C 06/08/2022, 11:44 AM Office: [phone]

## 2022-06-13 ENCOUNTER — Telehealth: Payer: Self-pay

## 2022-06-13 NOTE — Telephone Encounter (Signed)
I have referred for sleep study. Will continue to monitor for now and I am seeing him next month. Sleep study coordinator has left message and he has not made an appointment

## 2022-06-13 NOTE — Telephone Encounter (Signed)
Patient's wife returned our call she voiced understanding

## 2022-06-13 NOTE — Telephone Encounter (Signed)
Called patient, NA, LMAM

## 2022-06-13 NOTE — Telephone Encounter (Signed)
Patient wife called and stated that patient has gone back in to Afib. Patient was back in rhythm for the past 4-5 days, but then back out of rhythm. She wants to know if he needs to make an appointment.

## 2022-06-19 ENCOUNTER — Other Ambulatory Visit: Payer: Self-pay | Admitting: Cardiology

## 2022-07-02 ENCOUNTER — Other Ambulatory Visit: Payer: BC Managed Care – PPO

## 2022-07-09 ENCOUNTER — Ambulatory Visit: Payer: Medicare Other

## 2022-07-09 DIAGNOSIS — I480 Paroxysmal atrial fibrillation: Secondary | ICD-10-CM

## 2022-07-13 ENCOUNTER — Encounter: Payer: Medicare Other | Attending: Physical Medicine & Rehabilitation | Admitting: Physical Medicine & Rehabilitation

## 2022-07-13 ENCOUNTER — Encounter: Payer: Self-pay | Admitting: Physical Medicine & Rehabilitation

## 2022-07-13 VITALS — BP 135/79 | HR 68 | Temp 98.9°F | Ht 68.0 in | Wt 169.2 lb

## 2022-07-13 DIAGNOSIS — G8110 Spastic hemiplegia affecting unspecified side: Secondary | ICD-10-CM | POA: Diagnosis present

## 2022-07-13 DIAGNOSIS — G8111 Spastic hemiplegia affecting right dominant side: Secondary | ICD-10-CM | POA: Diagnosis present

## 2022-07-13 DIAGNOSIS — I69351 Hemiplegia and hemiparesis following cerebral infarction affecting right dominant side: Secondary | ICD-10-CM

## 2022-07-13 MED ORDER — PHENOL 5% (AQUEOUS) FOR INJECTION
5.0000 mL | Freq: Once | Status: AC
  Administered 2022-07-13: 5 mL via INTRAMUSCULAR

## 2022-07-13 MED ORDER — ONABOTULINUMTOXINA 100 UNITS IJ SOLR
200.0000 [IU] | Freq: Once | INTRAMUSCULAR | Status: AC
  Administered 2022-07-13: 200 [IU] via INTRAMUSCULAR

## 2022-07-13 NOTE — Patient Instructions (Signed)
Tibial nerve block with phenol today. This medication may start taking the fact today however full effect will be at about one week Duration of the effect is 3-6 months Side effects of medication may include right heel numbness or burning. Call if you have burning pain so we can recommend any medication for that. 

## 2022-07-13 NOTE — Progress Notes (Signed)
.  akBotox Injection for spasticity using needle EMG guidance  Dilution: 50 Units/ml Indication: Severe spasticity which interferes with ADL,mobility and/or  hygiene and is unresponsive to medication management and other conservative care Informed consent was obtained after describing risks and benefits of the procedure with the patient. This includes bleeding, bruising, infection, excessive weakness, or medication side effects. A REMS form is on file and signed. Needle: 27g 1"  needle electrode Number of units per muscle FPL 25 units FDS 25 units FDP 25 units FCR 25 units   All injections were done after obtaining appropriate EMG activity and after negative drawback for blood. The patient tolerated the procedure well. Post procedure instructions were given. A followup appointment was made.    Phenol neurolysis of the Right tibial nerve  Indication: Severe spasticity in the plantar flexor muscles which is not responding to medical management and other conservative care and interfering with functional use.  Informed consent was obtained after describing the risks and benefits of the procedure with the patient this includes bleeding bruising and infection as well as medication side effects. The patient elected to proceed and has given written consent. Patient placed in a prone position on the exam table. External DC stimulation was applied to the popliteal space using a nerve stimulator. Plantar flexion twitch was obtained. The popliteal region was prepped with Betadine and then entered with a 22-gauge 40 mm needle electrode under electrical stimulation guidance. Plantar flexion which was obtained and confirmed. Then 4 cc of 5% phenol were injected. The patient tolerated procedure well. Post procedure instructions and followup visit were given.

## 2022-07-16 ENCOUNTER — Encounter: Payer: Self-pay | Admitting: Cardiology

## 2022-07-16 ENCOUNTER — Ambulatory Visit: Payer: Medicare Other | Admitting: Cardiology

## 2022-07-16 VITALS — BP 120/80 | HR 62 | Resp 16 | Ht 68.0 in | Wt 167.4 lb

## 2022-07-16 DIAGNOSIS — I480 Paroxysmal atrial fibrillation: Secondary | ICD-10-CM

## 2022-07-16 DIAGNOSIS — Z5181 Encounter for therapeutic drug level monitoring: Secondary | ICD-10-CM

## 2022-07-16 DIAGNOSIS — I341 Nonrheumatic mitral (valve) prolapse: Secondary | ICD-10-CM

## 2022-07-16 DIAGNOSIS — Z006 Encounter for examination for normal comparison and control in clinical research program: Secondary | ICD-10-CM

## 2022-07-16 DIAGNOSIS — I10 Essential (primary) hypertension: Secondary | ICD-10-CM

## 2022-07-16 MED ORDER — APIXABAN 5 MG PO TABS: 5.0000 mg | ORAL_TABLET | Freq: Two times a day (BID) | ORAL | 3 refills | Status: DC

## 2022-07-16 NOTE — Progress Notes (Signed)
Primary Physician/Referring:  [name], MD  Patient ID: Dustin Schneider, male    DOB: 1955-10-26, 66 y.o.   MRN: [id number]  Chief Complaint  Patient presents with   Paroxysmal atrial fibrillation   Hyperlipidemia   Follow-up    6 Weeks   HPI:    Dustin Schneider  is a 66 y.o.  Caucasian male with paroxysmal atrial fibrillation, maintaining sinus rhythm on Tikosyn, left MCA/ACA CVA with residual right-sided spastic phemiparesis and expressive aphasia in June 2021 due to non compliance with anticoagulation S/P thrombectomy, chronic diastolic heart failure, hyperlipidemia.  Patient has family history of atrial fibrillation, his father and also his brother.  Presently asymptomatic.  Underwent echocardiogram and presents for follow-up.  Past Medical History:  Diagnosis Date   Allergy    Hyperlipidemia    Hypertension    NICM (nonischemic cardiomyopathy) ([place]) 06/15/2018   04/23/18: EF 45%, 09/15/18: NOrmal LVEF   Paroxysmal atrial fibrillation ([place]) 06/15/2018   Stroke [facility])    Visit for monitoring Tikosyn therapy 06/12/2018    Social History   Tobacco Use   Smoking status: Some Days    Types: Cigars   Smokeless tobacco: Never   Tobacco comments:    Once a year he will smoke a cigars   Substance Use Topics   Alcohol use: Not Currently   Marital Status: Divorced  ROS  Review of Systems  Cardiovascular:  Negative for chest pain, dyspnea on exertion and leg swelling.   Objective  Blood pressure 120/80, pulse 62, resp. rate 16, height '5\' 8"'$  (1.727 m), weight 167 lb 6.4 oz (75.9 kg), SpO2 96 %.     07/16/2022   11:40 AM 07/13/2022    1:30 PM 06/08/2022    9:52 AM  Vitals with BMI  Height '5\' 8"'$  '5\' 8"'$  '5\' 8"'$   Weight 167 lbs 6 oz 169 lbs 3 oz 166 lbs 10 oz  BMI 25.46 42.59 56.38  Systolic 756 433 295  Diastolic 80 79 91  Pulse 62 68 61   Physical Exam Neck:     Vascular: No carotid bruit or JVD.  Cardiovascular:     Rate and Rhythm: Normal rate and regular  rhythm.     Pulses: Intact distal pulses.     Heart sounds: Murmur heard.     Mid to late systolic murmur is present with a grade of 2/6 at the apex.     No gallop.  Pulmonary:     Effort: Pulmonary effort is normal.     Breath sounds: Normal breath sounds.  Abdominal:     General: Bowel sounds are normal.     Palpations: Abdomen is soft.  Musculoskeletal:     Right lower leg: No edema.     Left lower leg: No edema.     Comments: Right hemiplegia/hemiparesis and right foot drop    Lab Results  Component Value Date   NA 140 01/30/2022   K 4.2 01/30/2022   CO2 29 01/30/2022   GLUCOSE 111 (H) 01/30/2022   BUN 16 01/30/2022   CREATININE 0.91 01/30/2022   CALCIUM 9.8 01/30/2022   GFRNONAA >60 03/11/2020   Lab Results  Component Value Date   ALT 18 01/30/2022   AST 14 01/30/2022   ALKPHOS 55 01/30/2022   BILITOT 0.6 01/30/2022      Latest Ref Rng & Units 01/30/2022    3:00 PM 03/27/2021    2:00 PM 03/09/2020    6:56 AM  CBC  WBC  4.0 - 10.5 K/uL 5.5  7.3  5.7   Hemoglobin 13.0 - 17.0 g/dL 14.3  14.9  11.3   Hematocrit 39.0 - 52.0 % 42.8  45.3  34.7   Platelets 150.0 - 400.0 K/uL 203.0  215.0  234    Lipid Panel Recent Labs    01/30/22 1500  CHOL 130  TRIG 100.0  LDLCALC 61  VLDL 20.0  HDL 49.80  CHOLHDL 3    HEMOGLOBIN A1C Lab Results  Component Value Date   HGBA1C 5.6 01/28/2020   MPG 114 01/28/2020    Lab Results  Component Value Date   TSH 2.08 03/27/2021    Allergies  No Known Allergies    Current Outpatient Medications:    baclofen (LIORESAL) 20 MG tablet, Take 1 tablet (20 mg total) by mouth 3 (three) times daily., Disp: 60 tablet, Rfl: 2   diltiazem (CARDIZEM CD) 180 MG 24 hr capsule, TAKE ONE CAPSULE BY MOUTH DAILY, Disp: 30 capsule, Rfl: 6   dofetilide (TIKOSYN) 500 MCG capsule, TAKE ONE CAPSULE BY MOUTH TWICE DAILY, Disp: 180 capsule, Rfl: 3   rosuvastatin (CRESTOR) 10 MG tablet, TAKE 1 TABLET BY MOUTH DAILY AFTER SUPPER, Disp: 30 tablet,  Rfl: 6   apixaban (ELIQUIS) 5 MG TABS tablet, Take 1 tablet (5 mg total) by mouth 2 (two) times daily., Disp: 180 tablet, Rfl: 3  Radiology:   No results found.  Cardiac Studies:   Nuclear stress test  05/05/2018: 1. The resting electrocardiogram demonstrated atrial fibrillation. The stress electrocardiogram was positive for ischemia with 2 mm downsloping ST depression in the inferior and lateral leads, persisted for 2 mintues into recovery. Stress symptoms included palpitations and fatigue. Patient exercised on Bruce protocol for 6:00 minutes and achieved 7.05 METS. Stress test terminated due to fatigue and 113% MPHR achieved (Target HR >85%). 2. Left ventricular cavity is noted to be normal on the rest and stress studies. SPECT images demonstrate homogeneous tracer distribution throughout the myocardium. The left ventricular ejection fraction was calculated to be 38%, but visually appears to be at least low normal without wall motion abnormality. This is an intermediate risk study due to abnormal ST change and low LVEF (difficult in view of gating with A. Fib), clinical correlation recommended.  PCV ECHOCARDIOGRAM COMPLETE 07/09/2022  Narrative Echocardiogram 07/09/2022: Normal LV systolic function with visual EF 55-60%. Left ventricle cavity is normal in size. Normal left ventricular wall thickness. Normal global wall motion. Normal diastolic filling pattern, normal LAP. Calculated EF 64%. Structurally normal trileaflet aortic valve.  Moderate (Grade II) aortic regurgitation. Moderate to severe mitral regurgitation. Mild prolapse of the mitral valve leaflets. Structurally normal tricuspid valve.  Mild tricuspid regurgitation. No evidence of pulmonary hypertension. Compared to 09/2018, MR has slightly worsened.     EKG   EKG 07/16/2022: EKG 07/16/2022: Sinus rhythm with first-degree AV block at a rate of 52 bpm, normal axis, poor R wave progression, cannot exclude anteroseptal infarct old.   Low-voltage complexes.  Pulmonary disease pattern.  Nonspecific T abnormality.  Compared to 06/08/2022, no significant change. Compared to previous EKG on 06/01/2022, sinus bradycardia replaces A-fib.  Assessment     ICD-10-CM   1. Paroxysmal atrial fibrillation (HCC)  I48.0 EKG 12-Lead    apixaban (ELIQUIS) 5 MG TABS tablet    2. Essential hypertension  I10     3. Therapeutic drug monitoring  Z51.81     4. Enrolled in clinical trial of drug. Study discontinued Oceanic - AF trial. Restart Eliquis 07/16/22  Z00.6     5. MVP (mitral valve prolapse)  I34.1         Meds ordered this encounter  Medications   apixaban (ELIQUIS) 5 MG TABS tablet    Sig: Take 1 tablet (5 mg total) by mouth 2 (two) times daily.    Dispense:  180 tablet    Refill:  3    Medications Discontinued During This Encounter  Medication Reason   ELIQUIS 5 MG TABS tablet Reorder   Investigational - Study Medication Discontinued by provider     This patients CHA2DS2-VASc Score 4 (Age, stroke, HTN) and yearly risk of stroke 4.8%.   Recommendations:   RAIN FRIEDT  is a 66 y.o. Caucasian male with paroxysmal atrial fibrillation, maintaining sinus rhythm on Tikosyn, left MCA/ACA CVA with residual right-sided spastic phemiparesis and expressive aphasia in June 2021 due to non compliance with anticoagulation S/P thrombectomy, chronic diastolic heart failure, hyperlipidemia.  Patient has family history of atrial fibrillation, his father and also his brother.   1. Paroxysmal atrial fibrillation [facility]) Patient is presently maintaining sinus rhythm on dofetilide.  EKG reveals stable QT interval.  Continue present medications.  He has not had any further palpitations.  2. Essential hypertension Blood pressure is well-controlled.  No changes were done.  3. Therapeutic drug monitoring He needs close monitoring since being on dofetilide, I have discussed with the patient and his girlfriend regarding importance of drug  interaction and if any new medications were prescribed to check the drug interaction with Tikosyn.  Also to contact me if he has breakthrough atrial fibrillation and last >24 hours.  4. Enrolled in clinical trial of drug. Study discontinued [place] - AF trial. Restart Eliquis 07/16/22 Study has been discontinued, I started him back on Eliquis, he has appointment on 7 December for closeout study.  All questions answered in detail to avoid confusion.  5.  MVP Patient's echocardiogram reviewed, he does have late systolic murmur in the apex, moderate mitral regurgitation by echocardiogram, no clinical evidence of heart failure.  Will continue surveillance only for now.  No indication for valve replacement.    [name], MD, [facility] 07/16/2022, 12:18 PM Office: [phone] Fax: [phone] Pager: [phone]

## 2022-07-16 NOTE — Patient Instructions (Signed)
07/19/22 Appointment with Medication Management regarding OCEANIC-AF (Asundexian - factor XIa inhibitor PO BID vs Apixaban PO BID in patients with A. Fib for stroke prevention.

## 2022-07-17 ENCOUNTER — Other Ambulatory Visit: Payer: Self-pay | Admitting: Physical Medicine & Rehabilitation

## 2022-07-30 ENCOUNTER — Emergency Department (HOSPITAL_COMMUNITY)
Admission: EM | Admit: 2022-07-30 | Discharge: 2022-07-31 | Discharge status: Against Medical Advice | Payer: Medicare Other | Attending: Emergency Medicine | Admitting: Emergency Medicine

## 2022-07-30 ENCOUNTER — Telehealth: Payer: Self-pay

## 2022-07-30 ENCOUNTER — Encounter (HOSPITAL_COMMUNITY): Payer: Self-pay

## 2022-07-30 ENCOUNTER — Other Ambulatory Visit: Payer: Self-pay

## 2022-07-30 DIAGNOSIS — R008 Other abnormalities of heart beat: Secondary | ICD-10-CM | POA: Insufficient documentation

## 2022-07-30 DIAGNOSIS — Z5321 Procedure and treatment not carried out due to patient leaving prior to being seen by health care provider: Secondary | ICD-10-CM | POA: Diagnosis not present

## 2022-07-30 DIAGNOSIS — Z7901 Long term (current) use of anticoagulants: Secondary | ICD-10-CM | POA: Insufficient documentation

## 2022-07-30 LAB — CBC WITH DIFFERENTIAL/PLATELET
Abs Immature Granulocytes: 0.01 10*3/uL (ref 0.00–0.07)
Basophils Absolute: 0 10*3/uL (ref 0.0–0.1)
Basophils Relative: 0 %
Eosinophils Absolute: 0.1 10*3/uL (ref 0.0–0.5)
Eosinophils Relative: 1 %
HCT: 46.2 % (ref 39.0–52.0)
Hemoglobin: 16 g/dL (ref 13.0–17.0)
Immature Granulocytes: 0 %
Lymphocytes Relative: 25 %
Lymphs Abs: 1.7 10*3/uL (ref 0.7–4.0)
MCH: 32.6 pg (ref 26.0–34.0)
MCHC: 34.6 g/dL (ref 30.0–36.0)
MCV: 94.1 fL (ref 80.0–100.0)
Monocytes Absolute: 0.5 10*3/uL (ref 0.1–1.0)
Monocytes Relative: 7 %
Neutro Abs: 4.5 10*3/uL (ref 1.7–7.7)
Neutrophils Relative %: 67 %
Platelets: 231 10*3/uL (ref 150–400)
RBC: 4.91 MIL/uL (ref 4.22–5.81)
RDW: 13.2 % (ref 11.5–15.5)
WBC: 6.7 10*3/uL (ref 4.0–10.5)
nRBC: 0 % (ref 0.0–0.2)

## 2022-07-30 LAB — COMPREHENSIVE METABOLIC PANEL
ALT: 20 U/L (ref 0–44)
AST: 18 U/L (ref 15–41)
Albumin: 3.9 g/dL (ref 3.5–5.0)
Alkaline Phosphatase: 63 U/L (ref 38–126)
Anion gap: 11 (ref 5–15)
BUN: 15 mg/dL (ref 8–23)
CO2: 25 mmol/L (ref 22–32)
Calcium: 9.4 mg/dL (ref 8.9–10.3)
Chloride: 104 mmol/L (ref 98–111)
Creatinine, Ser: 1.03 mg/dL (ref 0.61–1.24)
GFR, Estimated: >60 mL/min (ref >60)
Glucose, Bld: 105 mg/dL — ABNORMAL HIGH (ref 70–99)
Potassium: 3.6 mmol/L (ref 3.5–5.1)
Sodium: 140 mmol/L (ref 135–145)
Total Bilirubin: 1 mg/dL (ref 0.3–1.2)
Total Protein: 6.5 g/dL (ref 6.5–8.1)

## 2022-07-30 LAB — MAGNESIUM: Magnesium: 1.9 mg/dL (ref 1.7–2.4)

## 2022-07-30 NOTE — ED Triage Notes (Signed)
Pt arrived POV from home c/o a-fib. Per family pt normally will go in and out of a-fib but this is the longest he has been in a-fib. Pt denies any pain. Pt has been in a-fib since Saturday.

## 2022-07-30 NOTE — ED Provider Triage Note (Addendum)
Emergency Medicine Provider Triage Evaluation Note  Dustin Schneider , a 66 y.o. male  was evaluated in triage.  Pt complains of irregular heart rate X 1 week, has history of afib on apixaban 5 mg.  Review of Systems  Positive: Fluttering in chest Negative: Sob, chest pain  Physical Exam  There were no vitals taken for this visit. Gen:   Awake, no distress   Resp:  Normal effort  MSK:   Moves extremities without difficulty  Other:    Medical Decision Making  Medically screening exam initiated at 9:23 AM.  Appropriate orders placed.  Dustin Schneider was informed that the remainder of the evaluation will be completed by another provider, this initial triage assessment does not replace that evaluation, and the importance of remaining in the ED until their evaluation is complete.  The following message was received from Dr. [name]:  Looks like he is back in AF and probably symptomatic, if no stroke, possible cardioversion today and discharge probably best option. Call me anytime [phone]    Darci Current 07/30/22 [id number]    Sherrye Payor A, PA-C 07/30/22 1045

## 2022-07-30 NOTE — Telephone Encounter (Signed)
Patient is feeling like he is having another stroke and is on his way to the hospital and wants to know if you can stop by and see him sometime today or tomorrow.

## 2022-07-30 NOTE — ED Notes (Signed)
Pt not responding for vitals 3x

## 2022-07-30 NOTE — ED Notes (Signed)
No answer x1

## 2022-07-31 ENCOUNTER — Ambulatory Visit: Payer: Medicare Other | Admitting: Cardiology

## 2022-07-31 ENCOUNTER — Encounter: Payer: Self-pay | Admitting: Cardiology

## 2022-07-31 VITALS — BP 138/86 | HR 77 | Ht 67.5 in | Wt 165.0 lb

## 2022-07-31 DIAGNOSIS — I10 Essential (primary) hypertension: Secondary | ICD-10-CM

## 2022-07-31 DIAGNOSIS — I4819 Other persistent atrial fibrillation: Secondary | ICD-10-CM

## 2022-07-31 DIAGNOSIS — I340 Nonrheumatic mitral (valve) insufficiency: Secondary | ICD-10-CM

## 2022-07-31 NOTE — Patient Instructions (Signed)
He was scheduled for cardioversion on 08/07/2022 at 10 AM.  Please present to the hospital same day around 8:30-9 AM.  Do not eat anything after midnight.  Okay to take the medications with a sip of water.

## 2022-07-31 NOTE — H&P (View-Only) (Signed)
Primary Physician/Referring:  [name], MD  Patient ID: Dustin Schneider, male    DOB: 08/17/1955, 66 y.o.   MRN: [id number]  No chief complaint on file.  HPI:    Dustin Schneider  is a 66 y.o.  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  Patient has family history of atrial fibrillation, his father and also his brother.  Patient presented to the emergency room on 07/30/2022 thinking that he is having another stroke and he was in atrial fibrillation.  Usually has brief episodes of palpitations but this time it was persistent hence presented to the ED.  He is presently asymptomatic with regard to atrial fibrillation, does not feel any palpitations, dyspnea or fatigue.  He is accompanied by his son.  Past Medical History:  Diagnosis Date   Allergy    Hyperlipidemia    Hypertension    NICM (nonischemic cardiomyopathy) ([place]) 06/15/2018   04/23/18: EF 45%, 09/15/18: NOrmal LVEF   Paroxysmal atrial fibrillation ([place]) 06/15/2018   Stroke [facility])    Visit for monitoring Tikosyn therapy 06/12/2018    Social History   Tobacco Use   Smoking status: Some Days    Types: Cigars   Smokeless tobacco: Never   Tobacco comments:    Once a year he will smoke a cigars   Substance Use Topics   Alcohol use: Not Currently   Marital Status: Divorced  ROS  Review of Systems  Cardiovascular:  Negative for chest pain, dyspnea on exertion and leg swelling.   Objective  Blood pressure 138/86, pulse 77, height 5' 7.5" (1.715 m), weight 165 lb (74.8 kg), SpO2 96 %.     07/31/2022   10:19 AM 07/30/2022   12:28 PM 07/30/2022    9:32 AM  Vitals with BMI  Height 5' 7.5"  5' 7.5"  Weight 165 lbs  165 lbs  BMI 13.08  65.78  Systolic 469 629    Diastolic 86 91   Pulse 77 64    Physical Exam Neck:     Vascular: No carotid bruit or JVD.  Cardiovascular:     Rate and Rhythm: Normal rate. Rhythm irregular.     Pulses: Intact distal pulses.     Heart sounds: Murmur heard.     Mid to late systolic murmur is present with a grade of 2/6 at the apex.     No gallop.  Pulmonary:     Effort: Pulmonary effort is normal.     Breath sounds: Normal breath sounds.  Abdominal:     General: Bowel sounds are normal.     Palpations: Abdomen is soft.  Musculoskeletal:     Right lower leg: No edema.     Left lower leg: No edema.     Comments: Right hemiplegia/hemiparesis and right foot drop    Lab Results  Component Value Date   NA 140 07/30/2022   K 3.6 07/30/2022   CO2 25 07/30/2022   GLUCOSE 105 (H) 07/30/2022   BUN 15 07/30/2022   CREATININE 1.03 07/30/2022   CALCIUM 9.4 07/30/2022   GFRNONAA >60 07/30/2022   Lab Results  Component Value Date   ALT 20 07/30/2022   AST 18 07/30/2022   ALKPHOS 63 07/30/2022   BILITOT 1.0 07/30/2022      Latest Ref Rng & Units  07/30/2022    9:39 AM 01/30/2022    3:00 PM 03/27/2021    2:00 PM  CBC  WBC 4.0 - 10.5 K/uL 6.7  5.5  7.3   Hemoglobin 13.0 - 17.0 g/dL 16.0  14.3  14.9   Hematocrit 39.0 - 52.0 % 46.2  42.8  45.3   Platelets 150 - 400 K/uL 231  203.0  215.0    Lipid Panel Recent Labs    01/30/22 1500  CHOL 130  TRIG 100.0  LDLCALC 61  VLDL 20.0  HDL 49.80  CHOLHDL 3    HEMOGLOBIN A1C Lab Results  Component Value Date   HGBA1C 5.6 01/28/2020   MPG 114 01/28/2020    Lab Results  Component Value Date   TSH 2.08 03/27/2021    Allergies  No Known Allergies    Current Outpatient Medications:    apixaban (ELIQUIS) 5 MG TABS tablet, Take 1 tablet (5 mg total) by mouth 2 (two) times daily., Disp: 180 tablet, Rfl: 3   baclofen (LIORESAL) 20 MG tablet, TAKE ONE TABLET BY MOUTH THREE TIMES DAILY, Disp: 60 tablet, Rfl: 2   diltiazem (CARDIZEM CD) 180 MG 24 hr capsule,  TAKE ONE CAPSULE BY MOUTH DAILY, Disp: 30 capsule, Rfl: 6   dofetilide (TIKOSYN) 500 MCG capsule, TAKE ONE CAPSULE BY MOUTH TWICE DAILY, Disp: 180 capsule, Rfl: 3   rosuvastatin (CRESTOR) 10 MG tablet, TAKE 1 TABLET BY MOUTH DAILY AFTER SUPPER, Disp: 30 tablet, Rfl: 6  Radiology:   No results found.  Cardiac Studies:   Nuclear stress test  05/05/2018: 1. The resting electrocardiogram demonstrated atrial fibrillation. The stress electrocardiogram was positive for ischemia with 2 mm downsloping ST depression in the inferior and lateral leads, persisted for 2 mintues into recovery. Stress symptoms included palpitations and fatigue. Patient exercised on Bruce protocol for 6:00 minutes and achieved 7.05 METS. Stress test terminated due to fatigue and 113% MPHR achieved (Target HR >85%). 2. Left ventricular cavity is noted to be normal on the rest and stress studies. SPECT images demonstrate homogeneous tracer distribution throughout the myocardium. The left ventricular ejection fraction was calculated to be 38%, but visually appears to be at least low normal without wall motion abnormality. This is an intermediate risk study due to abnormal ST change and low LVEF (difficult in view of gating with A. Fib), clinical correlation recommended.  PCV ECHOCARDIOGRAM COMPLETE 07/09/2022  Narrative Echocardiogram 07/09/2022: Normal LV systolic function with visual EF 55-60%. Left ventricle cavity is normal in size. Normal left ventricular wall thickness. Normal global wall motion. Normal diastolic filling pattern, normal LAP. Calculated EF 64%. Structurally normal trileaflet aortic valve.  Moderate (Grade II) aortic regurgitation. Moderate to severe mitral regurgitation. Mild prolapse of the mitral valve leaflets. Structurally normal tricuspid valve.  Mild tricuspid regurgitation. No evidence of pulmonary hypertension. Compared to 09/2018, MR has slightly worsened.     EKG   EKG 07/31/2022: Atrial  fibrillation with controlled ventricular response at rate of 81 bpm, normal axis, nonspecific T abnormality.  Compared to 07/16/2022, atrial fibrillation has replaced sinus rhythm.  EKG 07/30/2022: Atrial fibrillation with controlled ventricular response at rate of 79 bpm, normal axis, nonspecific T abnormality.  EKG 07/16/2022: EKG 07/16/2022: Sinus rhythm with first-degree AV block at a rate of 52 bpm, normal axis, poor R wave progression, cannot exclude anteroseptal infarct old.  Low-voltage complexes.  Pulmonary disease pattern.  Nonspecific T abnormality.  Compared to 06/08/2022, no significant change.   Assessment     ICD-10-CM  1. Persistent atrial fibrillation (HCC)  I48.19 EKG 12-Lead    2. Essential hypertension  I10     3. Severe mitral regurgitation  I34.0      No orders of the defined types were placed in this encounter.   There are no discontinued medications.  CHA2DS2-VASc Score 4 (Age, stroke, HTN) and yearly risk of stroke 4.8%.   Recommendations:   RAUL TORRANCE  is a 66 y.o. Caucasian male with paroxysmal atrial fibrillation, maintaining sinus rhythm on Tikosyn, left MCA/ACA CVA with residual right-sided spastic phemiparesis and expressive aphasia in June 2021 due to non compliance with anticoagulation S/P thrombectomy, chronic diastolic heart failure, hyperlipidemia.  Patient has family history of atrial fibrillation, his father and also his brother.  Patient presented to the ED yesterday that is on 07/30/2022 with palpitations, persistent atrial fibrillation.   1. Persistent atrial fibrillation ([place]) Patient did not stay in the emergency room for long.  He is still in persistent atrial fibrillation.  Will schedule him for direct-current cardioversion at the first available slot.  He remains asymptomatic without clinical evidence of heart failure.  He has not missed any doses for anticoagulation.  Presently on Eliquis.  2. Essential hypertension Blood pressure is  well-controlled.  3. Severe mitral regurgitation He does have severe mitral regurgitation, clinically asymptomatic, my suspicion is whether she is atrial fibrillation is induced by severe MR.  I may consider mitral valve repair soon.  I reviewed his labs from the emergency room visit, labs are stable for proceeding with cardioversion.    [name], MD, [facility] 07/31/2022, 10:54 AM Office: [phone] Fax: [phone] Pager: [phone]

## 2022-07-31 NOTE — Progress Notes (Signed)
Primary Physician/Referring:  [name], MD  Patient ID: Dustin Schneider, male    DOB: 02-06-1956, 66 y.o.   MRN: [id number]  No chief complaint on file.  HPI:    Dustin Schneider  is a 66 y.o.  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  Patient has family history of atrial fibrillation, his father and also his brother.  Patient presented to the emergency room on 07/30/2022 thinking that he is having another stroke and he was in atrial fibrillation.  Usually has brief episodes of palpitations but this time it was persistent hence presented to the ED.  He is presently asymptomatic with regard to atrial fibrillation, does not feel any palpitations, dyspnea or fatigue.  He is accompanied by his son.  Past Medical History:  Diagnosis Date   Allergy    Hyperlipidemia    Hypertension    NICM (nonischemic cardiomyopathy) ([place]) 06/15/2018   04/23/18: EF 45%, 09/15/18: NOrmal LVEF   Paroxysmal atrial fibrillation ([place]) 06/15/2018   Stroke [facility])    Visit for monitoring Tikosyn therapy 06/12/2018    Social History   Tobacco Use   Smoking status: Some Days    Types: Cigars   Smokeless tobacco: Never   Tobacco comments:    Once a year he will smoke a cigars   Substance Use Topics   Alcohol use: Not Currently   Marital Status: Divorced  ROS  Review of Systems  Cardiovascular:  Negative for chest pain, dyspnea on exertion and leg swelling.   Objective  Blood pressure 138/86, pulse 77, height 5' 7.5" (1.715 m), weight 165 lb (74.8 kg), SpO2 96 %.     07/31/2022   10:19 AM 07/30/2022   12:28 PM 07/30/2022    9:32 AM  Vitals with BMI  Height 5' 7.5"  5' 7.5"  Weight 165 lbs  165 lbs  BMI 90.24  09.73  Systolic 532 992    Diastolic 86 91   Pulse 77 64    Physical Exam Neck:     Vascular: No carotid bruit or JVD.  Cardiovascular:     Rate and Rhythm: Normal rate. Rhythm irregular.     Pulses: Intact distal pulses.     Heart sounds: Murmur heard.     Mid to late systolic murmur is present with a grade of 2/6 at the apex.     No gallop.  Pulmonary:     Effort: Pulmonary effort is normal.     Breath sounds: Normal breath sounds.  Abdominal:     General: Bowel sounds are normal.     Palpations: Abdomen is soft.  Musculoskeletal:     Right lower leg: No edema.     Left lower leg: No edema.     Comments: Right hemiplegia/hemiparesis and right foot drop    Lab Results  Component Value Date   NA 140 07/30/2022   K 3.6 07/30/2022   CO2 25 07/30/2022   GLUCOSE 105 (H) 07/30/2022   BUN 15 07/30/2022   CREATININE 1.03 07/30/2022   CALCIUM 9.4 07/30/2022   GFRNONAA >60 07/30/2022   Lab Results  Component Value Date   ALT 20 07/30/2022   AST 18 07/30/2022   ALKPHOS 63 07/30/2022   BILITOT 1.0 07/30/2022      Latest Ref Rng & Units  07/30/2022    9:39 AM 01/30/2022    3:00 PM 03/27/2021    2:00 PM  CBC  WBC 4.0 - 10.5 K/uL 6.7  5.5  7.3   Hemoglobin 13.0 - 17.0 g/dL 16.0  14.3  14.9   Hematocrit 39.0 - 52.0 % 46.2  42.8  45.3   Platelets 150 - 400 K/uL 231  203.0  215.0    Lipid Panel Recent Labs    01/30/22 1500  CHOL 130  TRIG 100.0  LDLCALC 61  VLDL 20.0  HDL 49.80  CHOLHDL 3    HEMOGLOBIN A1C Lab Results  Component Value Date   HGBA1C 5.6 01/28/2020   MPG 114 01/28/2020    Lab Results  Component Value Date   TSH 2.08 03/27/2021    Allergies  No Known Allergies    Current Outpatient Medications:    apixaban (ELIQUIS) 5 MG TABS tablet, Take 1 tablet (5 mg total) by mouth 2 (two) times daily., Disp: 180 tablet, Rfl: 3   baclofen (LIORESAL) 20 MG tablet, TAKE ONE TABLET BY MOUTH THREE TIMES DAILY, Disp: 60 tablet, Rfl: 2   diltiazem (CARDIZEM CD) 180 MG 24 hr capsule,  TAKE ONE CAPSULE BY MOUTH DAILY, Disp: 30 capsule, Rfl: 6   dofetilide (TIKOSYN) 500 MCG capsule, TAKE ONE CAPSULE BY MOUTH TWICE DAILY, Disp: 180 capsule, Rfl: 3   rosuvastatin (CRESTOR) 10 MG tablet, TAKE 1 TABLET BY MOUTH DAILY AFTER SUPPER, Disp: 30 tablet, Rfl: 6  Radiology:   No results found.  Cardiac Studies:   Nuclear stress test  05/05/2018: 1. The resting electrocardiogram demonstrated atrial fibrillation. The stress electrocardiogram was positive for ischemia with 2 mm downsloping ST depression in the inferior and lateral leads, persisted for 2 mintues into recovery. Stress symptoms included palpitations and fatigue. Patient exercised on Bruce protocol for 6:00 minutes and achieved 7.05 METS. Stress test terminated due to fatigue and 113% MPHR achieved (Target HR >85%). 2. Left ventricular cavity is noted to be normal on the rest and stress studies. SPECT images demonstrate homogeneous tracer distribution throughout the myocardium. The left ventricular ejection fraction was calculated to be 38%, but visually appears to be at least low normal without wall motion abnormality. This is an intermediate risk study due to abnormal ST change and low LVEF (difficult in view of gating with A. Fib), clinical correlation recommended.  PCV ECHOCARDIOGRAM COMPLETE 07/09/2022  Narrative Echocardiogram 07/09/2022: Normal LV systolic function with visual EF 55-60%. Left ventricle cavity is normal in size. Normal left ventricular wall thickness. Normal global wall motion. Normal diastolic filling pattern, normal LAP. Calculated EF 64%. Structurally normal trileaflet aortic valve.  Moderate (Grade II) aortic regurgitation. Moderate to severe mitral regurgitation. Mild prolapse of the mitral valve leaflets. Structurally normal tricuspid valve.  Mild tricuspid regurgitation. No evidence of pulmonary hypertension. Compared to 09/2018, MR has slightly worsened.     EKG   EKG 07/31/2022: Atrial  fibrillation with controlled ventricular response at rate of 81 bpm, normal axis, nonspecific T abnormality.  Compared to 07/16/2022, atrial fibrillation has replaced sinus rhythm.  EKG 07/30/2022: Atrial fibrillation with controlled ventricular response at rate of 79 bpm, normal axis, nonspecific T abnormality.  EKG 07/16/2022: EKG 07/16/2022: Sinus rhythm with first-degree AV block at a rate of 52 bpm, normal axis, poor R wave progression, cannot exclude anteroseptal infarct old.  Low-voltage complexes.  Pulmonary disease pattern.  Nonspecific T abnormality.  Compared to 06/08/2022, no significant change.   Assessment     ICD-10-CM  1. Persistent atrial fibrillation (HCC)  I48.19 EKG 12-Lead    2. Essential hypertension  I10     3. Severe mitral regurgitation  I34.0      No orders of the defined types were placed in this encounter.   There are no discontinued medications.  CHA2DS2-VASc Score 4 (Age, stroke, HTN) and yearly risk of stroke 4.8%.   Recommendations:   Dustin Schneider  is a 66 y.o. Caucasian male with paroxysmal atrial fibrillation, maintaining sinus rhythm on Tikosyn, left MCA/ACA CVA with residual right-sided spastic phemiparesis and expressive aphasia in June 2021 due to non compliance with anticoagulation S/P thrombectomy, chronic diastolic heart failure, hyperlipidemia.  Patient has family history of atrial fibrillation, his father and also his brother.  Patient presented to the ED yesterday that is on 07/30/2022 with palpitations, persistent atrial fibrillation.   1. Persistent atrial fibrillation ([place]) Patient did not stay in the emergency room for long.  He is still in persistent atrial fibrillation.  Will schedule him for direct-current cardioversion at the first available slot.  He remains asymptomatic without clinical evidence of heart failure.  He has not missed any doses for anticoagulation.  Presently on Eliquis.  2. Essential hypertension Blood pressure is  well-controlled.  3. Severe mitral regurgitation He does have severe mitral regurgitation, clinically asymptomatic, my suspicion is whether she is atrial fibrillation is induced by severe MR.  I may consider mitral valve repair soon.  I reviewed his labs from the emergency room visit, labs are stable for proceeding with cardioversion.    [name], MD, [facility] 07/31/2022, 10:54 AM Office: [phone] Fax: [phone] Pager: [phone]

## 2022-08-07 ENCOUNTER — Ambulatory Visit (HOSPITAL_COMMUNITY)
Admission: RE | Admit: 2022-08-07 | Discharge: 2022-08-07 | Disposition: A | Discharge status: Home / Self Care | Payer: Medicare Other | Attending: Cardiology | Admitting: Cardiology

## 2022-08-07 ENCOUNTER — Ambulatory Visit (HOSPITAL_COMMUNITY): Payer: Medicare Other | Admitting: Anesthesiology

## 2022-08-07 ENCOUNTER — Ambulatory Visit (HOSPITAL_BASED_OUTPATIENT_CLINIC_OR_DEPARTMENT_OTHER): Payer: Medicare Other | Admitting: Anesthesiology

## 2022-08-07 ENCOUNTER — Other Ambulatory Visit: Payer: Self-pay

## 2022-08-07 ENCOUNTER — Encounter (HOSPITAL_COMMUNITY): Payer: Self-pay | Admitting: Cardiology

## 2022-08-07 ENCOUNTER — Encounter (HOSPITAL_COMMUNITY)
Admission: RE | Disposition: A | Discharge status: Home / Self Care | Payer: Self-pay | Source: Home / Self Care | Attending: Cardiology

## 2022-08-07 DIAGNOSIS — G8190 Hemiplegia, unspecified affecting unspecified side: Secondary | ICD-10-CM

## 2022-08-07 DIAGNOSIS — I4819 Other persistent atrial fibrillation: Secondary | ICD-10-CM | POA: Diagnosis present

## 2022-08-07 DIAGNOSIS — I340 Nonrheumatic mitral (valve) insufficiency: Secondary | ICD-10-CM | POA: Insufficient documentation

## 2022-08-07 DIAGNOSIS — I69351 Hemiplegia and hemiparesis following cerebral infarction affecting right dominant side: Secondary | ICD-10-CM | POA: Insufficient documentation

## 2022-08-07 DIAGNOSIS — I10 Essential (primary) hypertension: Secondary | ICD-10-CM | POA: Diagnosis not present

## 2022-08-07 DIAGNOSIS — F17210 Nicotine dependence, cigarettes, uncomplicated: Secondary | ICD-10-CM

## 2022-08-07 DIAGNOSIS — I4891 Unspecified atrial fibrillation: Secondary | ICD-10-CM | POA: Diagnosis not present

## 2022-08-07 DIAGNOSIS — F17290 Nicotine dependence, other tobacco product, uncomplicated: Secondary | ICD-10-CM | POA: Insufficient documentation

## 2022-08-07 DIAGNOSIS — I69320 Aphasia following cerebral infarction: Secondary | ICD-10-CM | POA: Diagnosis not present

## 2022-08-07 DIAGNOSIS — Z7901 Long term (current) use of anticoagulants: Secondary | ICD-10-CM | POA: Diagnosis not present

## 2022-08-07 DIAGNOSIS — E785 Hyperlipidemia, unspecified: Secondary | ICD-10-CM | POA: Diagnosis not present

## 2022-08-07 HISTORY — PX: CARDIOVERSION: SHX1299

## 2022-08-07 SURGERY — CARDIOVERSION
Anesthesia: General

## 2022-08-07 MED ORDER — LIDOCAINE 2% (20 MG/ML) 5 ML SYRINGE
INTRAMUSCULAR | Status: DC | PRN
  Administered 2022-08-07: 50 mg via INTRAVENOUS

## 2022-08-07 MED ORDER — PROPOFOL 10 MG/ML IV BOLUS
INTRAVENOUS | Status: DC | PRN
  Administered 2022-08-07: 20 mg via INTRAVENOUS
  Administered 2022-08-07: 50 mg via INTRAVENOUS

## 2022-08-07 MED ORDER — SODIUM CHLORIDE 0.9 % IV SOLN: INTRAVENOUS | Status: DC

## 2022-08-07 NOTE — CV Procedure (Signed)
Direct current cardioversion 08/07/2022 10:13 AM  Indication symptomatic A. Fibrillation.  Procedure: Using 70 mg of IV Propofol and 50 IV Lidocaine (for reducing venous pain) for achieving deep sedation, synchronized direct current cardioversion performed. Patient was delivered with 150 Joules of electricity X 1 with success to NSR. Patient tolerated the procedure well. No immediate complication noted.   Allergies as of 08/07/2022   No Known Allergies      Medication List     TAKE these medications    apixaban 5 MG Tabs tablet Commonly known as: Eliquis Take 1 tablet (5 mg total) by mouth 2 (two) times daily.   baclofen 20 MG tablet Commonly known as: LIORESAL TAKE ONE TABLET BY MOUTH THREE TIMES DAILY What changed: when to take this   diltiazem 180 MG 24 hr capsule Commonly known as: CARDIZEM CD TAKE ONE CAPSULE BY MOUTH DAILY   dofetilide 500 MCG capsule Commonly known as: TIKOSYN TAKE ONE CAPSULE BY MOUTH TWICE DAILY   rosuvastatin 10 MG tablet Commonly known as: CRESTOR TAKE 1 TABLET BY MOUTH DAILY AFTER SUPPER          [name], MD, [facility] 08/07/2022, 10:13 AM Office: [phone] Fax: [phone] Pager: [phone]

## 2022-08-07 NOTE — Anesthesia Preprocedure Evaluation (Signed)
Anesthesia Evaluation  Patient identified by MRN, date of birth, ID band Patient awake    Reviewed: Allergy & Precautions, NPO status , Patient's Chart, lab work & pertinent test results  Airway Mallampati: I  TM Distance: >3 FB     Dental no notable dental hx. (+) Dental Advisory Given   Pulmonary Current Smoker and Patient abstained from smoking.   Pulmonary exam normal breath sounds clear to auscultation       Cardiovascular hypertension, Pt. on medications + dysrhythmias Atrial Fibrillation  Rhythm:Irregular Rate:Normal  Hx/o NICM  Echo 03/11/20 1. Left ventricular ejection fraction, by estimation, is 60 to 65%. The  left ventricle has normal function. The left ventricle has no regional  wall motion abnormalities.   2. Right ventricular systolic function is normal. The right ventricular  size is normal.   3. No left atrial/left atrial appendage thrombus was detected.   4. The mitral valve is normal in structure. Mild mitral valve  regurgitation.   5. The aortic valve is tricuspid. Aortic valve regurgitation is mild.   6. Agitated saline contrast bubble study was negative, with no evidence  of any interatrial shunt.     Neuro/Psych Spastic right hemiplegia and global aphasia CVA, Residual Symptoms  negative psych ROS   GI/Hepatic negative GI ROS, Neg liver ROS,,,  Endo/Other  Hyperlipidemia  Renal/GU negative Renal ROS   Elevated PSA    Musculoskeletal negative musculoskeletal ROS (+)    Abdominal   Peds  Hematology Eliquis therapy- last dose this am   Anesthesia Other Findings Aphasia and right hemiplegia  Reproductive/Obstetrics                              Anesthesia Physical Anesthesia Plan  ASA: 3  Anesthesia Plan: General   Post-op Pain Management: Minimal or no pain anticipated   Induction: Intravenous  PONV Risk Score and Plan: Propofol infusion, Treatment may  vary due to age or medical condition and TIVA  Airway Management Planned: Natural Airway and Mask  Additional Equipment: None  Intra-op Plan:   Post-operative Plan:   Informed Consent: I have reviewed the patients History and Physical, chart, labs and discussed the procedure including the risks, benefits and alternatives for the proposed anesthesia with the patient or authorized representative who has indicated his/her understanding and acceptance.     Dental advisory given  Plan Discussed with: CRNA and Anesthesiologist  Anesthesia Plan Comments:          Anesthesia Quick Evaluation

## 2022-08-07 NOTE — Anesthesia Procedure Notes (Signed)
Procedure Name: General with mask airway Date/Time: 08/07/2022 10:13 AM  Performed by: Janace Litten, CRNAPre-anesthesia Checklist: Patient identified, Emergency Drugs available, Suction available, Patient being monitored and Timeout performed Patient Re-evaluated:Patient Re-evaluated prior to induction Oxygen Delivery Method: Ambu bag Preoxygenation: Pre-oxygenation with 100% oxygen Induction Type: IV induction

## 2022-08-07 NOTE — Transfer of Care (Signed)
Immediate Anesthesia Transfer of Care Note  Patient: Dustin Schneider  Procedure(s) Performed: CARDIOVERSION  Patient Location: PACU and Endoscopy Unit  Anesthesia Type:General  Level of Consciousness: drowsy, patient cooperative, and responds to stimulation  Airway & Oxygen Therapy: Patient Spontanous Breathing  Post-op Assessment: Report given to RN and Post -op Vital signs reviewed and stable  Post vital signs: Reviewed and stable  Last Vitals:  Vitals Value Taken Time  BP    Temp    Pulse    Resp    SpO2      Last Pain:  Vitals:   08/07/22 0937  PainSc: 0-No pain         Complications: No notable events documented.

## 2022-08-07 NOTE — Interval H&P Note (Signed)
History and Physical Interval Note:  08/07/2022 10:06 AM  Dustin Schneider  has presented today for surgery, with the diagnosis of a fib.  The various methods of treatment have been discussed with the patient and family. After consideration of risks, benefits and other options for treatment, the patient has consented to  Procedure(s): CARDIOVERSION (N/A) as a surgical intervention.  The patient's history has been reviewed, patient examined, no change in status, stable for surgery.  I have reviewed the patient's chart and labs.  Questions were answered to the patient's satisfaction.     [name]

## 2022-08-07 NOTE — Anesthesia Postprocedure Evaluation (Signed)
Anesthesia Post Note  Patient: Dustin Schneider  Procedure(s) Performed: CARDIOVERSION     Patient location during evaluation: PACU Anesthesia Type: General Level of consciousness: awake and alert and oriented Pain management: pain level controlled Vital Signs Assessment: post-procedure vital signs reviewed and stable Respiratory status: spontaneous breathing, nonlabored ventilation and respiratory function stable Cardiovascular status: blood pressure returned to baseline and stable Postop Assessment: no apparent nausea or vomiting Anesthetic complications: no   No notable events documented.  Last Vitals:  Vitals:   08/07/22 1030 08/07/22 1040  BP: 106/71 (!) 113/99  Pulse: (!) 58 (!) 52  Resp: 12 19  Temp:    SpO2: 96% 95%    Last Pain:  Vitals:   08/07/22 1040  PainSc: 0-No pain                 [name]

## 2022-08-07 NOTE — Discharge Instructions (Signed)

## 2022-08-09 ENCOUNTER — Encounter (HOSPITAL_COMMUNITY): Payer: Self-pay | Admitting: Cardiology

## 2022-08-14 ENCOUNTER — Telehealth: Payer: Self-pay | Admitting: Cardiology

## 2022-08-14 DIAGNOSIS — I480 Paroxysmal atrial fibrillation: Secondary | ICD-10-CM

## 2022-08-14 MED ORDER — METOPROLOL SUCCINATE ER 50 MG PO TB24: 50.0000 mg | ORAL_TABLET | Freq: Every day | ORAL | 2 refills | Status: DC

## 2022-08-14 NOTE — Telephone Encounter (Signed)
Chief Complaint  Patient presents with   Atrial Fibrillation    Back in Atral fibrillation      ICD-10-CM   1. Paroxysmal atrial fibrillation (HCC)  I48.0 metoprolol succinate (TOPROL-XL) 50 MG 24 hr tablet     Meds ordered this encounter  Medications   metoprolol succinate (TOPROL-XL) 50 MG 24 hr tablet    Sig: Take 1 tablet (50 mg total) by mouth daily. Take with or immediately following a meal.    Dispense:  30 tablet    Refill:  2     [name], MD, [facility] 08/14/2022, 2:59 PM Office: [phone] Fax: [phone] Pager: [phone]

## 2022-08-21 ENCOUNTER — Encounter: Payer: Medicare Other | Attending: Physical Medicine & Rehabilitation | Admitting: Physical Medicine & Rehabilitation

## 2022-08-21 ENCOUNTER — Encounter: Payer: Self-pay | Admitting: Physical Medicine & Rehabilitation

## 2022-08-21 VITALS — BP 143/79 | HR 58 | Ht 67.5 in | Wt 167.8 lb

## 2022-08-21 DIAGNOSIS — G8110 Spastic hemiplegia affecting unspecified side: Secondary | ICD-10-CM | POA: Insufficient documentation

## 2022-08-21 NOTE — Patient Instructions (Signed)
Will increase botox dose to allow injection of pronator teres Will use E stim to isolate little finger  Also repeat R tibial nerve injection with Phenol ( behind R knee)

## 2022-08-21 NOTE — Progress Notes (Signed)
Subjective:    Patient ID: Dustin Schneider, male    DOB: 1955/12/03, 67 y.o.   MRN: [id number]  HPI 67 yo male with hx of chronic Left MCA infarct with Right hemiparesis and aphasia   07/13/22 RUE Botox injection for severe spasticity affecting mainly finger flexors FPL 25 units FDS 25 units FDP 25 units FCR 25 units   Patient also had right tibial nerve block/neurolysis with phenol on that same date. No postprocedure complications Feels like his fingers are looser still has some tightness in the forearm. Also feels like his foot is more relaxed.  We discussed the purpose of Botox and phenol. In addition the patient feels his little finger is still bent on the right side Pain Inventory Average Pain 0 Pain Right Now 0 My pain is  no pain  LOCATION OF PAIN  no pain  BOWEL Number of stools per week: 7 Oral laxative use  . Type of laxative . Enema or suppository use No  History of colostomy No  Incontinent No   BLADDER Normal In and out cath, frequency . Able to self cath  . Bladder incontinence No  Frequent urination No  Leakage with coughing No  Difficulty starting stream No  Incomplete bladder emptying No    Mobility use a cane ability to climb steps?  yes do you drive?  no  Function disabled: date disabled 02/2020  Neuro/Psych trouble walking  Prior Studies Any changes since last visit?  no  Physicians involved in your care Any changes since last visit?  no   Family History  Problem Relation Age of Onset   Glaucoma Mother    Atrial fibrillation Father    Social History   Socioeconomic History   Marital status: Divorced    Spouse name: Not on file   Number of children: 1   Years of education: Not on file   Highest education level: Not on file  Occupational History   Not on file  Tobacco Use   Smoking status: Some Days    Types: Cigars   Smokeless tobacco: Never   Tobacco comments:    Once a year he will smoke a cigars   Vaping Use    Vaping Use: Never used  Substance and Sexual Activity   Alcohol use: Not Currently   Drug use: No   Sexual activity: Not on file    Comment: DIVORCE  Other Topics Concern   Not on file  Social History Narrative   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 [place] Determinants of Health   Financial Resource Strain: Not on file  Food Insecurity: Not on file  Transportation Needs: Not on file  Physical Activity: Not on file  Stress: Not on file  Social Connections: Not on file   Past Surgical History:  Procedure Laterality Date   BUBBLE STUDY  03/11/2020   Procedure: BUBBLE STUDY;  Surgeon: [name], MD;  Location: [place];  Service: Cardiovascular;;   CARDIOVERSION N/A 05/13/2018   Procedure: CARDIOVERSION;  Surgeon: [name], MD;  Location: [place];  Service: Cardiovascular;  Laterality: N/A;   CARDIOVERSION N/A 06/13/2018   Procedure: CARDIOVERSION;  Surgeon: [name], MD;  Location: [facility] ENDOSCOPY;  Service: Cardiovascular;  Laterality: N/A;   CARDIOVERSION N/A 06/23/2018   Procedure: CARDIOVERSION;  Surgeon: [name], MD;  Location: [place];  Service: Cardiovascular;  Laterality: N/A;   CARDIOVERSION N/A 03/11/2020   Procedure: CARDIOVERSION;  Surgeon:  [name], MD;  Location: [place];  Service: Cardiovascular;  Laterality: N/A;   CARDIOVERSION N/A 08/07/2022   Procedure: CARDIOVERSION;  Surgeon: [name], MD;  Location: [place];  Service: Cardiovascular;  Laterality: N/A;   excision of actinic keratosis     EYE SURGERY     eye lift   IR CT HEAD LTD  01/27/2020   IR PERCUTANEOUS ART THROMBECTOMY/INFUSION INTRACRANIAL INC DIAG ANGIO  01/27/2020       IR PERCUTANEOUS ART THROMBECTOMY/INFUSION INTRACRANIAL INC DIAG ANGIO  01/27/2020   RADIOLOGY WITH ANESTHESIA N/A 01/27/2020   Procedure: IR WITH ANESTHESIA;  Surgeon: Radiologist, Medication, MD;  Location: [place];  Service: Radiology;  Laterality: N/A;   TEE WITHOUT  CARDIOVERSION N/A 03/11/2020   Procedure: TRANSESOPHAGEAL ECHOCARDIOGRAM (TEE);  Surgeon: [name], MD;  Location[facility] ENDOSCOPY;  Service: Cardiovascular;  Laterality: N/A;   TONSILLECTOMY     WISDOM TOOTH EXTRACTION     Past Medical History:  Diagnosis Date   Allergy    Hyperlipidemia    Hypertension    NICM (nonischemic cardiomyopathy) ([place]) 06/15/2018   04/23/18: EF 45%, 09/15/18: NOrmal LVEF   Paroxysmal atrial fibrillation ([place]) 06/15/2018   Stroke ([place])    Visit for monitoring Tikosyn therapy 06/12/2018   BP (!) 143/79   Pulse (!) 58   Ht 5' 7.5" (1.715 m)   Wt 167 lb 12.8 oz (76.1 kg)   SpO2 94%   BMI 25.89 kg/m   Opioid Risk Score:   Fall Risk Score:  `1  Depression screen PHQ 2/9     07/13/2022    1:34 PM 05/29/2022    2:51 PM 04/03/2022    2:21 PM 02/20/2022    2:42 PM 01/02/2022    2:40 PM 11/14/2021    3:04 PM 07/18/2021    3:08 PM  Depression screen PHQ 2/9  Decreased Interest 0 0 0 0 0 0 0  Down, Depressed, Hopeless 0 0 0 0 0 0 0  PHQ - 2 Score 0 0 0 0 0 0 0     Review of Systems  Musculoskeletal:  Positive for gait problem.  All other systems reviewed and are negative.     Objective:   Physical Exam  Tone [name] 1 at the finger flexors with exception of the fifth digit on the right hand which is [name] 2 at the PIP.  There is also some abduction tone in the right little finger Pronator tone is 3 Bicep tone is 2 Right foot and ankle no evidence of clonus with standing there is inversion of the foot with some toe curling.      Assessment & Plan:  #1.  Right spastic hemiplegia secondary to chronic left MCA distribution infarct improvement in right upper extremity tone.  Does have significant pronator tone would add this to the injection in March. FPL 25 units FDS 25 units FDP 25 units FCR 25 units Pronator teres 50U   Try estim to address Right 5th digit persistent flexion and abduction consider right ADM injection with 25 units   RIght tibial nerve  block with phenol, plan to repeat in March.

## 2022-08-24 ENCOUNTER — Ambulatory Visit: Payer: Medicare Other | Admitting: Cardiology

## 2022-08-24 ENCOUNTER — Encounter: Payer: Self-pay | Admitting: Cardiology

## 2022-08-24 ENCOUNTER — Ambulatory Visit: Payer: BC Managed Care – PPO | Admitting: Physical Medicine & Rehabilitation

## 2022-08-24 VITALS — BP 116/74 | HR 55 | Ht 67.5 in | Wt 184.2 lb

## 2022-08-24 DIAGNOSIS — I340 Nonrheumatic mitral (valve) insufficiency: Secondary | ICD-10-CM

## 2022-08-24 DIAGNOSIS — I480 Paroxysmal atrial fibrillation: Secondary | ICD-10-CM

## 2022-08-24 DIAGNOSIS — I341 Nonrheumatic mitral (valve) prolapse: Secondary | ICD-10-CM

## 2022-08-24 MED ORDER — METOPROLOL SUCCINATE ER 50 MG PO TB24: 50.0000 mg | ORAL_TABLET | Freq: Every day | ORAL | 1 refills | Status: DC

## 2022-08-24 NOTE — Progress Notes (Addendum)
Primary Physician/Referring:  [name], MD  Patient ID: Dustin Schneider, male    DOB: [birth year], 67 y.o.   MRN: [id number]  Chief Complaint  Patient presents with   Atrial Fibrillation   Follow-up    6 weeks    HPI:    Dustin Schneider  is a 67 y.o.  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  Patient has family history of atrial fibrillation, his father and also his brother.  Patient underwent direct-current cardioversion on 08/07/2022 and presents here for follow-up. Patient had called me on 08/14/2022 stating that he is back in atrial fibrillation.  I called in for metoprolol succinate 50 mg daily, advised him to observe for now and that he had a follow-up appointment with me soon.  Patient states that since starting metoprolol 2 days later he went back into sinus rhythm and has maintained sinus rhythm.  Presently asymptomatic.  Past Medical History:  Diagnosis Date   Allergy    Hyperlipidemia    Hypertension    NICM (nonischemic cardiomyopathy) ([place]) 06/15/2018   04/23/18: EF 45%, 09/15/18: NOrmal LVEF   Paroxysmal atrial fibrillation ([place]) 06/15/2018   Stroke [facility])    Visit for monitoring Tikosyn therapy 06/12/2018    Social History   Tobacco Use   Smoking status: Some Days    Types: Cigars   Smokeless tobacco: Never   Tobacco comments:    Once a year he will smoke a cigars   Substance Use Topics   Alcohol use: Not Currently   Marital Status: Divorced  ROS  Review of Systems  Cardiovascular:  Negative for chest pain, dyspnea on exertion and leg swelling.   Objective  Blood pressure 116/74, pulse (!) 55, height 5' 7.5" (1.715 m), weight 184 lb 3.2 oz (83.6 kg), SpO2 97 %.     10/04/2022   11:17 AM 08/24/2022     2:24 PM 08/21/2022   12:36 PM  Vitals with BMI  Height 5' 7.5" 5' 7.5" 5' 7.5"  Weight 169 lbs 184 lbs 3 oz 167 lbs 13 oz  BMI 26.06 123456 [id number]  Systolic 92 [id number]  Diastolic 50 74 79  Pulse 54 55 58   Physical Exam Neck:     Vascular: No carotid bruit or JVD.  Cardiovascular:     Rate and Rhythm: Normal rate and regular rhythm.     Pulses: Intact distal pulses.     Heart sounds: Murmur heard.     Mid to late systolic murmur is present with a grade of 2/6 at the apex.     No gallop.  Pulmonary:     Effort: Pulmonary effort is normal.     Breath sounds: Normal breath sounds.  Abdominal:     General: Bowel sounds are normal.     Palpations: Abdomen is soft.  Musculoskeletal:     Right lower leg: No edema.     Left lower leg: No edema.     Comments: Right hemiplegia/hemiparesis and right foot drop    Lab Results  Component Value Date   NA 140 07/30/2022   K 3.6 07/30/2022   CO2 25 07/30/2022   GLUCOSE 105 (H) 07/30/2022   BUN 15 07/30/2022   CREATININE 1.03 07/30/2022   CALCIUM 9.4 07/30/2022   GFRNONAA >60 07/30/2022  Lab Results  Component Value Date   ALT 20 07/30/2022   AST 18 07/30/2022   ALKPHOS 63 07/30/2022   BILITOT 1.0 07/30/2022      Latest Ref Rng & Units 07/30/2022    9:39 AM 01/30/2022    3:00 PM 03/27/2021    2:00 PM  CBC  WBC 4.0 - 10.5 K/uL 6.7  5.5  7.3   Hemoglobin 13.0 - 17.0 g/dL 16.0  14.3  14.9   Hematocrit 39.0 - 52.0 % 46.2  42.8  45.3   Platelets 150 - 400 K/uL 231  203.0  215.0    Lipid Panel Recent Labs    01/30/22 1500  CHOL 130  TRIG 100.0  LDLCALC 61  VLDL 20.0  HDL 49.80  CHOLHDL 3    HEMOGLOBIN A1C Lab Results  Component Value Date   HGBA1C 5.6 01/28/2020   MPG 114 01/28/2020    Lab Results  Component Value Date   TSH 2.08 03/27/2021    Allergies  No Known Allergies    Current Outpatient Medications:    apixaban (ELIQUIS) 5 MG TABS tablet, Take 1 tablet (5 mg total) by mouth 2 (two) times daily.,  Disp: 180 tablet, Rfl: 3   baclofen (LIORESAL) 20 MG tablet, TAKE ONE TABLET BY MOUTH THREE TIMES DAILY (Patient taking differently: Take 20 mg by mouth at bedtime.), Disp: 60 tablet, Rfl: 2   diltiazem (CARDIZEM CD) 180 MG 24 hr capsule, TAKE ONE CAPSULE BY MOUTH DAILY, Disp: 30 capsule, Rfl: 6   dofetilide (TIKOSYN) 500 MCG capsule, TAKE ONE CAPSULE BY MOUTH TWICE DAILY, Disp: 180 capsule, Rfl: 3   metoprolol succinate (TOPROL-XL) 50 MG 24 hr tablet, Take 1 tablet (50 mg total) by mouth daily. Take with or immediately following a meal., Disp: 90 tablet, Rfl: 1   rosuvastatin (CRESTOR) 10 MG tablet, TAKE 1 TABLET BY MOUTH DAILY AFTER SUPPER, Disp: 30 tablet, Rfl: 6   bethanechol (URECHOLINE) 25 MG tablet, Take 25 mg by mouth 3 (three) times daily. 1/2 tablet three times daily, Disp: , Rfl:   Radiology:   No results found.  Cardiac Studies:   Nuclear stress test  05/05/2018: 1. The resting electrocardiogram demonstrated atrial fibrillation. The stress electrocardiogram was positive for ischemia with 2 mm downsloping ST depression in the inferior and lateral leads, persisted for 2 mintues into recovery. Stress symptoms included palpitations and fatigue. Patient exercised on Bruce protocol for 6:00 minutes and achieved 7.05 METS. Stress test terminated due to fatigue and 113% MPHR achieved (Target HR >85%). 2. Left ventricular cavity is noted to be normal on the rest and stress studies. SPECT images demonstrate homogeneous tracer distribution throughout the myocardium. The left ventricular ejection fraction was calculated to be 38%, but visually appears to be at least low normal without wall motion abnormality. This is an intermediate risk study due to abnormal ST change and low LVEF (difficult in view of gating with A. Fib), clinical correlation recommended.  PCV ECHOCARDIOGRAM COMPLETE 07/09/2022  Narrative Echocardiogram 07/09/2022: Normal LV systolic function with visual EF 55-60%. Left  ventricle cavity is normal in size. Normal left ventricular wall thickness. Normal global wall motion. Normal diastolic filling pattern, normal LAP. Calculated EF 64%. Structurally normal trileaflet aortic valve.  Moderate (Grade II) aortic regurgitation. Moderate to severe mitral regurgitation. Mild prolapse of the mitral valve leaflets. Structurally normal tricuspid valve.  Mild tricuspid regurgitation. No evidence of pulmonary hypertension. Compared to 09/2018, MR has slightly worsened.     EKG  EKG 08/24/2022: Normal sinus rhythm at the rate of 55 bpm, normal axis, single PAC with aberrancy.  Nonspecific T abnormality.  QTc 450 ms.  Compared to 07/31/2022, sinus rhythm has replaced atrial fibrillation.  EKG 07/30/2022: Atrial fibrillation with controlled ventricular response at rate of 79 bpm, normal axis, nonspecific T abnormality.  EKG 07/16/2022: EKG 07/16/2022: Sinus rhythm with first-degree AV block at a rate of 52 bpm, normal axis, poor R wave progression, cannot exclude anteroseptal infarct old.  Low-voltage complexes.  Pulmonary disease pattern.  Nonspecific T abnormality.  Compared to 06/08/2022, no significant change.   Assessment     ICD-10-CM   1. Paroxysmal atrial fibrillation (HCC)  I48.0 EKG 12-Lead    PCV ECHOCARDIOGRAM COMPLETE    metoprolol succinate (TOPROL-XL) 50 MG 24 hr tablet    2. MVP (mitral valve prolapse)  I34.1     3. Moderate to severe mitral regurgitation  I34.0 PCV ECHOCARDIOGRAM COMPLETE     Meds ordered this encounter  Medications   metoprolol succinate (TOPROL-XL) 50 MG 24 hr tablet    Sig: Take 1 tablet (50 mg total) by mouth daily. Take with or immediately following a meal.    Dispense:  90 tablet    Refill:  1    Medications Discontinued During This Encounter  Medication Reason   dofetilide (TIKOSYN) 500 MCG capsule Discontinued by provider   metoprolol succinate (TOPROL-XL) 50 MG 24 hr tablet Reorder    CHA2DS2-VASc Score 4 (Age, stroke,  HTN) and yearly risk of stroke 4.8%.   Recommendations:   Dustin Schneider  is a 67 y.o. Caucasian male with paroxysmal atrial fibrillation, maintaining sinus rhythm on Tikosyn, left MCA/ACA CVA with residual right-sided spastic phemiparesis and expressive aphasia in June 2021 due to non compliance with anticoagulation S/P thrombectomy, chronic diastolic heart failure, hyperlipidemia.  Patient has family history of atrial fibrillation, his father and also his brother.  This is post cardioversion visit.  1. Paroxysmal atrial fibrillation [facility]) Patient underwent direct-current cardioversion on 08/07/2022 and presents here for follow-up. Patient had called me on 08/14/2022 stating that he is back in atrial fibrillation.  After starting metoprolol, patient reverted back to sinus rhythm.  He is maintaining sinus rhythm today and remains asymptomatic.  Continue both beta-blocker at small to moderate dose and also calcium channel blocker therapy for now.  Not too concerned about heart block.  He is remains asymptomatic.  I have discussed with the patient and his brother-in-law who brought up the topic of ablation.  Advised him that ablation would not change therapy and he will probably need continued AAD and also anticoagulants permanently.  However if he continues to have recurrent episodes of atrial fibrillation, it would certainly be a good option.  2. MVP (mitral valve prolapse) Patient's brother-in-law is also present at the bedside, patient has moderate to severe mitral regurgitation related to mitral valve prolapse.  But on auscultation I feel that the mitral regurgitation is at most moderate if not mild to moderate.  No clinical evidence of heart failure, he remains asymptomatic.  Hence we will continue observation only for now.  3. Moderate to severe mitral regurgitation Do not think he needs surgical consultation or mitral valve repair at this point.  I would like to repeat echocardiogram 6 months  since prior echocardiogram and I would like to see him back then for follow-up of both mitral regurgitation and LV systolic function.  Will decide whether he needs to have mitral valve surgery/repair/MitraClip.  [name], MD, [facility] 10/04/2022, 1:26 PM Office: [phone] Fax: [phone] Pager: [phone]

## 2022-08-28 ENCOUNTER — Telehealth: Payer: Self-pay | Admitting: Cardiology

## 2022-08-28 DIAGNOSIS — I480 Paroxysmal atrial fibrillation: Secondary | ICD-10-CM

## 2022-08-28 NOTE — Telephone Encounter (Signed)
Called patient brother in law and informed him that Dr [name] will refer him to an EP

## 2022-08-28 NOTE — Telephone Encounter (Signed)
Needs a referral for EP

## 2022-08-28 NOTE — Telephone Encounter (Signed)
ICD-10-CM   1. Paroxysmal atrial fibrillation (HCC)  I48.0 Ambulatory referral to Cardiac Electrophysiology     Orders Placed This Encounter  Procedures   Ambulatory referral to Cardiac Electrophysiology    Referral Priority:   Routine    Referral Type:   Consultation    Referral Reason:   Specialty Services Required    Requested Specialty:   Cardiology    Number of Visits Requested:   1

## 2022-08-28 NOTE — Telephone Encounter (Signed)
Patient's brother [name] calling to report patient is back in a-fib. He would like to discuss how they should proceed. Please call him back at [phone].

## 2022-08-28 NOTE — Telephone Encounter (Signed)
done

## 2022-09-11 ENCOUNTER — Ambulatory Visit: Payer: BC Managed Care – PPO | Admitting: Cardiology

## 2022-09-21 ENCOUNTER — Other Ambulatory Visit: Payer: Self-pay | Admitting: Cardiology

## 2022-09-25 NOTE — Telephone Encounter (Signed)
Refill request

## 2022-10-04 ENCOUNTER — Telehealth: Payer: Self-pay | Admitting: Cardiology

## 2022-10-04 ENCOUNTER — Ambulatory Visit: Payer: Medicare Other | Attending: Cardiology | Admitting: Cardiology

## 2022-10-04 ENCOUNTER — Encounter: Payer: Self-pay | Admitting: Cardiology

## 2022-10-04 VITALS — BP 92/50 | HR 54 | Ht 67.5 in | Wt 169.0 lb

## 2022-10-04 DIAGNOSIS — D6869 Other thrombophilia: Secondary | ICD-10-CM

## 2022-10-04 DIAGNOSIS — I480 Paroxysmal atrial fibrillation: Secondary | ICD-10-CM

## 2022-10-04 NOTE — Telephone Encounter (Signed)
   [name] brother in law calling, he said he has some questions about the pt's visit today with Dr. [name] and requesting if his nurse can call him back

## 2022-10-04 NOTE — Patient Instructions (Signed)
Medication Instructions:  Your physician recommends that you continue on your current medications as directed. Please refer to the Current Medication list given to you today.  *If you need a refill on your cardiac medications before your next appointment, please call your pharmacy*   Lab Work: None ordered   Testing/Procedures: None ordered   Follow-Up: At [facility], you and your health needs are our priority.  As part of our continuing mission to provide you with exceptional heart care, we have created designated Provider Care Teams.  These Care Teams include your primary Cardiologist (physician) and Advanced Practice Providers (APPs -  Physician Assistants and Nurse Practitioners) who all work together to provide you with the care you need, when you need it.   Your next appointment:   To be  determined  The format for your next appointment:   In Person  Provider:   Allegra Lai, MD{   Dr. [name] is going to discuss your case with Dr. [name]   Thank you for choosing [facility]!!   Trinidad Curet, RN [phone]

## 2022-10-04 NOTE — Telephone Encounter (Signed)
Informed brother in law that Dr. Horace Porteous and Dr. [name] have already spoken. Dr. [name] will be in touch with them to arrange a TEE. Informed that Dr. [name] will let us know if pt needs to be seen by [place] again. Brother in law verbalized understanding and agreeable to plan.

## 2022-10-04 NOTE — Progress Notes (Signed)
Electrophysiology Office Note   Date:  10/04/2022   ID:  Dustin Schneider, Dustin Schneider [birth year], MRN [id number]  PCP:  [name], MD  Cardiologist:  [name] Primary Electrophysiologist:  [name] [name], MD    Chief Complaint: PAF   History of Present Illness: Dustin Schneider is a 67 y.o. male who is being seen today for the evaluation of PAF at the request of [name], MD. Presenting today for electrophysiology evaluation.  [name]  is a 67 y.o.  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  Patient has family history of atrial fibrillation, his father and also his brother.    Patient underwent direct-current cardioversion on 08/07/2022 and called his primary cardiologist on 08/14/22 noting to be back in AF. He began Toprol 50 mg daily and went back into SR. He cannot feel when he is in AF but wears an Apple watch that alerts him. After being seen by cardiologist on 1/12, he called back on 1/16 noting to be back in AF and was referred to EP to discuss ablation.   Currently on Tikosyn since 2019 with most recent lab work on 07/30/22 being unremarkable. He is compliant with his diltiazem, toprol, tikosyn, and eliquis.  He is able to walk up hills using his cane without symptoms.   Most recent echocardiogram on 07/09/22 showed EF 55-60%, moderate aortic regurgitation, and mitral valve prolapse with moderate to severe mitral regurgitation.   Today, he denies symptoms of palpitations, chest pain, shortness of breath, orthopnea, PND, lower extremity edema, claudication, dizziness, presyncope, syncope, bleeding, or neurologic sequela. The patient is tolerating medications without difficulties.    Past Medical History:   Diagnosis Date   Allergy    Hyperlipidemia    Hypertension    NICM (nonischemic cardiomyopathy) ([place]) 06/15/2018   04/23/18: EF 45%, 09/15/18: NOrmal LVEF   Paroxysmal atrial fibrillation ([place]) 06/15/2018   Stroke ([place])    Visit for monitoring Tikosyn therapy 06/12/2018   Past Surgical History:  Procedure Laterality Date   BUBBLE STUDY  03/11/2020   Procedure: BUBBLE STUDY;  Surgeon: [name], MD;  Location: [place];  Service: Cardiovascular;;   CARDIOVERSION N/A 05/13/2018   Procedure: CARDIOVERSION;  Surgeon: [name], MD;  Location: [place];  Service: Cardiovascular;  Laterality: N/A;   CARDIOVERSION N/A 06/13/2018   Procedure: CARDIOVERSION;  Surgeon: [name], MD;  Location: [facility] ENDOSCOPY;  Service: Cardiovascular;  Laterality: N/A;   CARDIOVERSION N/A 06/23/2018   Procedure: CARDIOVERSION;  Surgeon: [name], MD;  Location: [place];  Service: Cardiovascular;  Laterality: N/A;   CARDIOVERSION N/A 03/11/2020   Procedure: CARDIOVERSION;  Surgeon: [name], MD;  Location: [facility] ENDOSCOPY;  Service: Cardiovascular;  Laterality: N/A;   CARDIOVERSION N/A 08/07/2022   Procedure: CARDIOVERSION;  Surgeon: [name], MD;  Location: [place];  Service: Cardiovascular;  Laterality: N/A;   excision of actinic keratosis     EYE SURGERY     eye lift   IR CT HEAD LTD  01/27/2020   IR PERCUTANEOUS ART THROMBECTOMY/INFUSION INTRACRANIAL INC DIAG ANGIO  01/27/2020       IR PERCUTANEOUS ART THROMBECTOMY/INFUSION INTRACRANIAL INC DIAG ANGIO  01/27/2020   RADIOLOGY WITH ANESTHESIA N/A 01/27/2020   Procedure: IR WITH ANESTHESIA;  Surgeon: Radiologist, Medication, MD;  Location: [place];  Service: Radiology;  Laterality: N/A;   TEE WITHOUT CARDIOVERSION N/A 03/11/2020   Procedure: TRANSESOPHAGEAL ECHOCARDIOGRAM (TEE);  Surgeon: [name], MD;  Location: [name] ENDOSCOPY;  Service: Cardiovascular;  Laterality: N/A;   TONSILLECTOMY     WISDOM TOOTH EXTRACTION       Current  Outpatient Medications  Medication Sig Dispense Refill   apixaban (ELIQUIS) 5 MG TABS tablet Take 1 tablet (5 mg total) by mouth 2 (two) times daily. 180 tablet 3   baclofen (LIORESAL) 20 MG tablet TAKE ONE TABLET BY MOUTH THREE TIMES DAILY (Patient taking differently: Take 20 mg by mouth at bedtime.) 60 tablet 2   bethanechol (URECHOLINE) 25 MG tablet Take 25 mg by mouth 3 (three) times daily. 1/2 tablet three times daily     diltiazem (CARDIZEM CD) 180 MG 24 hr capsule TAKE ONE CAPSULE BY MOUTH DAILY 30 capsule 6   dofetilide (TIKOSYN) 500 MCG capsule TAKE ONE CAPSULE BY MOUTH TWICE DAILY 180 capsule 3   metoprolol succinate (TOPROL-XL) 50 MG 24 hr tablet Take 1 tablet (50 mg total) by mouth daily. Take with or immediately following a meal. 90 tablet 1   rosuvastatin (CRESTOR) 10 MG tablet TAKE 1 TABLET BY MOUTH DAILY AFTER SUPPER 30 tablet 6   No current facility-administered medications for this visit.    Allergies:   Patient has no known allergies.   Social History:  The patient  reports that he has been smoking cigars. He has never used smokeless tobacco. He reports that he does not currently use alcohol. He reports that he does not use drugs.   Family History:  The patient's family history includes Atrial fibrillation in his father and brother; Glaucoma in his mother.    ROS:  Please see the history of present illness.   Otherwise, review of systems is positive for none.   All other systems are reviewed and negative.    PHYSICAL EXAM: VS:  BP (!) 92/50   Pulse (!) 54   Ht 5' 7.5" (1.715 m)   Wt 169 lb (76.7 kg)   SpO2 98%   BMI 26.08 kg/m  , BMI Body mass index is 26.08 kg/m. GEN: Well nourished, well developed, in no acute distress. Splint on right lower leg. Aphasia noted during conversation.  HEENT: normal  Neck: no JVD, carotid bruits, or masses Cardiac: Irregular heart rhythm; 2/6 systolic murmur; no rubs, or gallops,no edema  Respiratory:  clear to auscultation  bilaterally, normal work of breathing GI: soft, nontender, nondistended, + BS MS: no deformity or atrophy  Skin: warm and dry,  Neuro:  Strength and sensation are intact Psych: euthymic mood, full affect  EKG:  EKG is ordered today. Personal review of the ekg ordered shows atrial fibrillation   Recent Labs: 07/30/2022: ALT 20; BUN 15; Creatinine, Ser 1.03; Hemoglobin 16.0; Magnesium 1.9; Platelets 231; Potassium 3.6; Sodium 140    Lipid Panel     Component Value Date/Time   CHOL 130 01/30/2022 1500   CHOL 226 (H) 12/17/2019 0831   TRIG 100.0 01/30/2022 1500   HDL 49.80 01/30/2022 1500   HDL 39 (L) 12/17/2019 0831   CHOLHDL 3 01/30/2022 1500   VLDL 20.0 01/30/2022 1500   LDLCALC 61 01/30/2022 1500   LDLCALC 154 (H) 12/17/2019 0831     Wt Readings from Last 3 Encounters:  10/04/22 169 lb (76.7 kg)  08/24/22 184 lb 3.2 oz (83.6 kg)  08/21/22 167 lb 12.8 oz (76.1 kg)      Other studies  Reviewed: Additional studies/ records that were reviewed today include:  Echocardiogram 07/09/2022:  Normal LV systolic function with visual EF 55-60%. Left ventricle cavity  is normal in size. Normal left ventricular wall thickness. Normal global  wall motion. Normal diastolic filling pattern, normal LAP. Calculated EF  64%.  Structurally normal trileaflet aortic valve.  Moderate (Grade II) aortic  regurgitation.  Moderate to severe mitral regurgitation. Mild prolapse of the mitral valve  leaflets.  Structurally normal tricuspid valve.  Mild tricuspid regurgitation. No  evidence of pulmonary hypertension.  Compared to 09/2018, MR has slightly worsened.    ASSESSMENT AND PLAN:  1.  AF  - Discussed ablation procedure in detail with visual diagram and risks vs benefits. After discussion, patient would like to proceed with ablation and is not interested in additional medication therapy. Advised to continue current medication therapy.   2. Mitral valve prolapse with moderate to severe  mitral regurgitation  - Per [place] discuss with primary cardiologist to determine if patient may benefit from dual procedure of mitral repair/mitral clip and ablation. Scheduling Jazari Ober be determined after discussion.  3. Secondary hypercoagulable state  - Cha2ds2-vasc score 4; continue eliquis.    Current medicines are reviewed at length with the patient today.   The patient does not have concerns regarding his medicines.  The following changes were made today:  none  Orders Placed This Encounter  Procedures   EKG 12-Lead    Disposition:   Discussion with primary cardiologist to determine if patient may benefit from dual procedure of ablation with mitral valve surgery/repair/clip.   Signed, Iwalani Templeton [name], MD  10/04/2022 1:30 PM     [address] [place] [id number] [phone] (office) [phone] (fax)  I have seen and examined this patient with Emily Filbert.  Agree with above, note added to reflect my findings.  He has a history significant for persistent atrial fibrillation, CVA with right-sided spastic hemiparesis and expressive aphasia, diastolic heart failure, hyperlipidemia.  He has had multiple cardioversions for atrial fibrillation.  Today he tells me that he is feeling well.  Despite this, he tells his primary cardiologist that he has weakness and fatigue when he is in atrial fibrillation.  GEN: Well nourished, well developed, in no acute distress  HEENT: normal  Neck: no JVD, carotid bruits, or masses Cardiac: irregular; no murmurs, rubs, or gallops,no edema  Respiratory:  clear to auscultation bilaterally, normal work of breathing GI: soft, nontender, nondistended, + BS MS: no deformity or atrophy  Skin: warm and dry Neuro:  Strength and sensation are intact Psych: euthymic mood, full affect   Paroxysmal atrial fibrillation: Currently on Eliquis and dofetilide.  CHA2DS2-VASc of 4 is continued to have episodes of atrial  fibrillation.  His most recent echo shows moderate to severe mitral regurgitation.  I am concerned that his mitral regurgitation is contributing to his atrial fibrillation.  If mitral regurgitation is actually severe, he would benefit from potential mitral valve surgery and maze.  If not moderate to severe no indication for surgery, ablation would be a reasonable option.  I discussed this with his primary cardiologist.  He Shanikia Kernodle plan to see him back in the office and we Deeann Servidio schedule a TEE to further evaluate his mitral valve. Second hypercoagulable state: Currently on Eliquis for atrial fibrillation Mild to moderate mitral regurgitation: Plan for TEE per primary cardiology   Catalia Massett M. Dot Splinter MD 10/04/2022 1:30 PM

## 2022-10-11 ENCOUNTER — Telehealth: Payer: Self-pay | Admitting: *Deleted

## 2022-10-11 NOTE — Telephone Encounter (Signed)
Order placed with compounding pharmacy [facility] for phenol injection scheduled for 10/16/22'@2pm'$ .

## 2022-10-12 ENCOUNTER — Telehealth: Payer: Self-pay | Admitting: Cardiology

## 2022-10-12 NOTE — Telephone Encounter (Signed)
I reviewed the notes from Dr. Macky Lower consultation, I discussed with the patient's family that atrial fibrillation ablation in the absence of symptoms or LV systolic dysfunction would not probably be beneficial and Dr. [name] was also concerned with her mitral regurgitation is causing atrial fibrillation.  I reviewed his echocardiogram again, MR appears to be mild to moderate at most by physical exam and moderate by echocardiogram very minimally changed from 2020.  Do not suspect mitral regurgitation to be severe enough that he needs mitral valve repair or replacement.  I discussed the options of changing to amiodarone as well.  Overall patient's atrial fibrillation appears to be paroxysmal and he appears to be asymptomatic with regard to PAF, as long as he is on anticoagulation, and remains asymptomatic, LVEF preserved, we should continue present management.  We could certainly consider a event monitor for 2 weeks to evaluate for atrial fibrillation burden and to correlate that with his symptoms as well if necessary.  He has an appointment to see me in June, we could consider repeating his echocardiogram at that time as well.  All his questions were answered, to their satisfaction.  For now we will continue present management.

## 2022-10-16 ENCOUNTER — Encounter: Payer: Medicare Other | Attending: Physical Medicine & Rehabilitation | Admitting: Physical Medicine & Rehabilitation

## 2022-10-16 ENCOUNTER — Encounter: Payer: Self-pay | Admitting: Physical Medicine & Rehabilitation

## 2022-10-16 VITALS — BP 144/68 | HR 72 | Ht 67.5 in | Wt 168.0 lb

## 2022-10-16 DIAGNOSIS — G8110 Spastic hemiplegia affecting unspecified side: Secondary | ICD-10-CM | POA: Insufficient documentation

## 2022-10-16 DIAGNOSIS — G8111 Spastic hemiplegia affecting right dominant side: Secondary | ICD-10-CM | POA: Insufficient documentation

## 2022-10-16 MED ORDER — INCOBOTULINUMTOXINA 100 UNITS IM SOLR
200.0000 [IU] | Freq: Once | INTRAMUSCULAR | Status: AC
  Administered 2023-07-23: 200 [IU] via INTRAMUSCULAR

## 2022-10-16 MED ORDER — PHENOL 5% (AQUEOUS) FOR INJECTION
5.0000 mL | Freq: Once | Status: AC
  Administered 2022-10-16: 5 mL

## 2022-10-16 NOTE — Addendum Note (Signed)
Addended by: Dessa Phi D on: 10/16/2022 03:14 PM   Modules accepted: Orders

## 2022-10-16 NOTE — Patient Instructions (Signed)
You received a Xeomin injection today. You may experience soreness at the needle injection sites. Please call us if any of the injection sites turns red after a couple days or if there is any drainage. You may experience muscle weakness as a result of Xeomin This would improve with time but can take several weeks to improve. The Xeomin should start working in about one week. The Xeomin usually last 3 months. The injection can be repeated every 3 months as needed.  Tibial nerve block with phenol today. This medication may start taking the fact today however full effect will be at about one week Duration of the effect is 3-6 months Side effects of medication may include right heel numbness or burning. Call if you have burning pain so we can recommend any medication for that.

## 2022-10-16 NOTE — Progress Notes (Addendum)
Xeomin Injection for spasticity using needle EMG [id number]  Dilution: 50 Units/ml Indication: Severe spasticity which interferes with ADL,mobility and/or  hygiene and is unresponsive to medication management and other conservative care Informed consent was obtained after describing risks and benefits of the procedure with the patient. This includes bleeding, bruising, infection, excessive weakness, or medication side effects. A REMS form is on file and signed. Needle: 27g 1"  needle electrode Number of units per muscle FPL 25 units FDS 25 units estim to isolate Right 5th digit  flexion  FDP 25 units FCR 25 units  Xeomin 200U vial Lot [id number] Exp 08/2023  Pronator teres 50U right ADM injection with 25 U    All injections were done after obtaining appropriate EMG activity and after negative drawback for blood. The patient tolerated the procedure well. Post procedure instructions were given. A followup appointment was made.     Phenol neurolysis of the Right tibial nerve 64640  Indication: Severe spasticity in the plantar flexor muscles which is not responding to medical management and other conservative care and interfering with functional use.  Informed consent was obtained after describing the risks and benefits of the procedure with the patient this includes bleeding bruising and infection as well as medication side effects. The patient elected to proceed and has given written consent. Patient placed in a prone position on the exam table. External DC stimulation was applied to the popliteal space using a nerve stimulator. Plantar flexion twitch was obtained. The popliteal region was prepped with Betadine and then entered with a 22-gauge 40 mm needle electrode under electrical stimulation guidance. Plantar flexion which was obtained and confirmed. Then 4 cc of 5% phenol were injected. The patient tolerated procedure well. Post procedure instructions and followup  visit were given.

## 2022-10-22 ENCOUNTER — Other Ambulatory Visit: Payer: Self-pay | Admitting: Physical Medicine & Rehabilitation

## 2022-10-24 ENCOUNTER — Telehealth: Payer: Self-pay | Admitting: *Deleted

## 2022-10-24 DIAGNOSIS — G8111 Spastic hemiplegia affecting right dominant side: Secondary | ICD-10-CM

## 2022-10-24 MED ORDER — BACLOFEN 20 MG PO TABS: 20.0000 mg | ORAL_TABLET | Freq: Three times a day (TID) | ORAL | 2 refills | Status: DC

## 2022-10-24 NOTE — Telephone Encounter (Signed)
[facility] calling for refill authorization on Baclofen 20 mg. Sent

## 2022-11-05 ENCOUNTER — Telehealth: Payer: Self-pay | Admitting: Internal Medicine

## 2022-11-05 NOTE — Telephone Encounter (Signed)
Patient would like his dermatology referral to be transferred from [facility] to [facility] on [address], [place], [place] [address]. He has not seen the original referral because they are booked so far out. Best call back is [phone].

## 2022-11-08 ENCOUNTER — Other Ambulatory Visit: Payer: Self-pay | Admitting: Internal Medicine

## 2022-11-14 ENCOUNTER — Other Ambulatory Visit: Payer: Self-pay | Admitting: Cardiology

## 2022-11-16 NOTE — Telephone Encounter (Signed)
LVM for pt to call back regarding what the dermatology referral is needed for.

## 2022-11-22 NOTE — Telephone Encounter (Signed)
2nd attempt. LVM for pt to call back.

## 2022-11-27 ENCOUNTER — Encounter: Payer: Self-pay | Admitting: Physical Medicine & Rehabilitation

## 2022-11-27 ENCOUNTER — Encounter: Payer: Medicare Other | Attending: Physical Medicine & Rehabilitation | Admitting: Physical Medicine & Rehabilitation

## 2022-11-27 VITALS — BP 121/73 | HR 65 | Ht 67.5 in | Wt 170.0 lb

## 2022-11-27 DIAGNOSIS — I69351 Hemiplegia and hemiparesis following cerebral infarction affecting right dominant side: Secondary | ICD-10-CM | POA: Insufficient documentation

## 2022-11-27 NOTE — Patient Instructions (Signed)
Please work on 2 arm stretches and 1 leg stretch

## 2022-11-27 NOTE — Progress Notes (Signed)
Xeomin Injection for spasticity using needle EMG guidance- [id number]  Dilution: 50 Units/ml Indication: Severe spasticity which interferes with ADL,mobility and/or  hygiene and is unresponsive to medication management and other conservative care Informed consent was obtained after describing risks and benefits of the procedure with the patient. This includes bleeding, bruising, infection, excessive weakness, or medication side effects. A REMS form is on file and signed. Needle: 27g 1"  needle electrode Number of units per muscle FPL 25 units FDS 25 units estim to isolate Right 5th digit  flexion  FDP 25 units FCR 25 units  Xeomin 200U vial Lot [id number] NDT [id number] Exp 08/2023  Pronator teres 50U right ADM injection with 25 U    All injections were done after obtaining appropriate EMG activity and after negative drawback for blood. The patient tolerated the procedure well. Post procedure instructions were given. A followup appointment was made.     Phenol neurolysis of the Right tibial nerve 64640  Indication: Severe spasticity in the plantar flexor muscles which is not responding to medical management and other conservative care and interfering with functional use.  Informed consent was obtained after describing the risks and benefits of the procedure with the patient this includes bleeding bruising and infection as well as medication side effects. The patient elected to proceed and has given written consent. Patient placed in a prone position on the exam table. External DC stimulation was applied to the popliteal space using a nerve stimulator. Plantar flexion twitch was obtained. The popliteal region was prepped with Betadine and then entered with a 22-gauge 40 mm needle electrode under electrical stimulation guidance. Plantar flexion which was obtained and confirmed. Then 4 cc of 5% phenol were injected. The patient tolerated procedure well. Post procedure instructions and followup  visit were given.   Subjective:    Patient ID: Dustin Schneider, male    DOB: 28-Dec-1955, 67 y.o.   MRN: [id number]  HPI  Last visit from 10/16/22 as above Hx Left MCA infarct with Right spastic hemiparesis Patient returns today saying that the right little finger is about the same however the rest of his hand and arm feel looser.  He also feels like his right lower extremity is a little bit looser. He has had no falls or any other new issues.  He has remained independent with all self-care with the exception of bathing.  He needs assistance with bathing meal prep household duties and shopping.  He is here with a friend of his.  He does climb steps.  He no longer drives. Pain Inventory Average Pain 0 Pain Right Now 0 My pain is  N/A  LOCATION OF PAIN  N/A  BOWEL Number of stools per week: 7 Oral laxative use No  Type of laxative N/A Enema or suppository use No  History of colostomy No  Incontinent No   BLADDER Normal In and out cath, frequency N/A Able to self cath  N/A Bladder incontinence No  Frequent urination No  Leakage with coughing No  Difficulty starting stream No  Incomplete bladder emptying No    Mobility walk with assistance use a cane ability to climb steps?  yes do you drive?  no Do you have any goals in this area?  no  Function disabled: date disabled [place] 2021  Neuro/Psych trouble walking  Prior Studies NONE  Physicians involved in your care FOLLOW UP   Family History  Problem Relation Age of Onset   Glaucoma Mother    Atrial fibrillation Father  Social History   Socioeconomic History   Marital status: Divorced    Spouse name: Not on file   Number of children: 1   Years of education: Not on file   Highest education level: Not on file  Occupational History   Not on file  Tobacco Use   Smoking status: Some Days    Types: Cigars   Smokeless tobacco: Never   Tobacco comments:    Once a year he will smoke a cigars   Vaping Use    Vaping Use: Never used  Substance and Sexual Activity   Alcohol use: Not Currently   Drug use: No   Sexual activity: Not on file    Comment: DIVORCE  Other Topics Concern   Not on file  Social History Narrative   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 [place]   Social Determinants of Health   Financial Resource Strain: Not on file  Food Insecurity: Not on file  Transportation Needs: Not on file  Physical Activity: Not on file  Stress: Not on file  Social Connections: Not on file   Past Surgical History:  Procedure Laterality Date   BUBBLE STUDY  03/11/2020   Procedure: BUBBLE STUDY;  Surgeon: Yates Decamp, MD;  Location: [facility] ENDOSCOPY;  Service: Cardiovascular;;   CARDIOVERSION N/A 05/13/2018   Procedure: CARDIOVERSION;  Surgeon: Yates Decamp, MD;  Location: [facility] ENDOSCOPY;  Service: Cardiovascular;  Laterality: N/A;   CARDIOVERSION N/A 06/13/2018   Procedure: CARDIOVERSION;  Surgeon: Wendall Stade, MD;  Location: [facility] ENDOSCOPY;  Service: Cardiovascular;  Laterality: N/A;   CARDIOVERSION N/A 06/23/2018   Procedure: CARDIOVERSION;  Surgeon: Yates Decamp, MD;  Location: [facility] ENDOSCOPY;  Service: Cardiovascular;  Laterality: N/A;   CARDIOVERSION N/A 03/11/2020   Procedure: CARDIOVERSION;  Surgeon: Yates Decamp, MD;  Location: [facility] ENDOSCOPY;  Service: Cardiovascular;  Laterality: N/A;   CARDIOVERSION N/A 08/07/2022   Procedure: CARDIOVERSION;  Surgeon: Yates Decamp, MD;  Location: [facility] ENDOSCOPY;  Service: Cardiovascular;  Laterality: N/A;   excision of actinic keratosis     EYE SURGERY     eye lift   IR CT HEAD LTD  01/27/2020   IR PERCUTANEOUS ART THROMBECTOMY/INFUSION INTRACRANIAL INC DIAG ANGIO  01/27/2020       IR PERCUTANEOUS ART THROMBECTOMY/INFUSION INTRACRANIAL INC DIAG ANGIO  01/27/2020   RADIOLOGY WITH ANESTHESIA N/A 01/27/2020   Procedure: IR WITH ANESTHESIA;  Surgeon: Radiologist, Medication, MD;  Location: [facility] OR;  Service: Radiology;  Laterality: N/A;   TEE WITHOUT  CARDIOVERSION N/A 03/11/2020   Procedure: TRANSESOPHAGEAL ECHOCARDIOGRAM (TEE);  Surgeon: Yates Decamp, MD;  Location: [facility] ENDOSCOPY;  Service: Cardiovascular;  Laterality: N/A;   TONSILLECTOMY     WISDOM TOOTH EXTRACTION     Past Medical History:  Diagnosis Date   Allergy    Hyperlipidemia    Hypertension    NICM (nonischemic cardiomyopathy) (HCC) 06/15/2018   04/23/18: EF 45%, 09/15/18: NOrmal LVEF   Paroxysmal atrial fibrillation (HCC) 06/15/2018   Stroke (HCC)    Visit for monitoring Tikosyn therapy 06/12/2018   BP (!) 158/89   Pulse 65   Ht 5' 7.5" (1.715 m)   Wt 170 lb (77.1 kg)   SpO2 96%   BMI 26.23 kg/m   Opioid Risk Score:   Fall Risk Score:  `1  Depression screen [facility] 2/9     11/27/2022    2:28 PM 10/16/2022    2:11 PM 07/13/2022    1:34 PM 05/29/2022    2:51  PM 04/03/2022    2:21 PM 02/20/2022    2:42 PM 01/02/2022    2:40 PM  Depression screen PHQ 2/9  Decreased Interest 0 0 0 0 0 0 0  Down, Depressed, Hopeless 0 0 0 0 0 0 0  PHQ - 2 Score 0 0 0 0 0 0 0     Review of Systems  All other systems reviewed and are negative.      Objective:   Physical Exam  Motor strength is 3 - at the right deltoid bicep tricep finger flexors and extensors.  He does have clonus at the wrist and fingers on the right hand mainly with active extension. Tone [name] 1 at the elbow flexor finger flexors wrist flexor thumb flexor. Patient does have [name] 2/3 tone at the right pronator. Right lower extremity tone [name] 3 at the ankle plantar flexors.  With standing he does have foot inversion as well as toe curling.  Toe curling is relatively mild foot inversion is moderate.  Patient also has tight heel cords on the right side. Speech with expressive aphasia. He is independently able to don and doff his AFO      Assessment & Plan:   1.  Right spastic hemiplegia and aphasia residual from left MCA CVA.  His original stroke 2021.  He is independent with his basic self-care.  He still has  elevated tone in the right upper limb and right lower limb which is only partially relieved with physical therapy.  He is doing relatively well with Xeomin injections for the upper extremity and phenol injection right tibial for the lower extremity. We discussed stretching exercises to stretch finger and wrist flexors in the right upper extremity as well as pronator stretch in the right upper extremity as well as heel cord stretch in a seated position in the right lower extremity.  We went over these with the patient and his caregiver.  We discussed referral back to PT and OT he did not wish to pursue this at the current time.  I will see him back in 6 weeks to repeat right upper extremity Xeomin injection  Plan to repeat tibial nerve block with phenol in approximately 6 months from the prior injection.

## 2022-12-26 ENCOUNTER — Ambulatory Visit: Payer: BC Managed Care – PPO | Admitting: Student

## 2023-01-15 ENCOUNTER — Ambulatory Visit: Payer: BC Managed Care – PPO | Admitting: Cardiology

## 2023-01-15 ENCOUNTER — Other Ambulatory Visit: Payer: BC Managed Care – PPO

## 2023-01-17 ENCOUNTER — Encounter: Payer: Medicare Other | Attending: Physical Medicine & Rehabilitation | Admitting: Physical Medicine & Rehabilitation

## 2023-01-17 ENCOUNTER — Encounter: Payer: Self-pay | Admitting: Physical Medicine & Rehabilitation

## 2023-01-17 VITALS — BP 136/85 | HR 57 | Temp 98.6°F | Ht 67.5 in | Wt 168.0 lb

## 2023-01-17 DIAGNOSIS — G8111 Spastic hemiplegia affecting right dominant side: Secondary | ICD-10-CM | POA: Diagnosis present

## 2023-01-17 MED ORDER — INCOBOTULINUMTOXINA 100 UNITS IM SOLR
200.0000 [IU] | Freq: Once | INTRAMUSCULAR | Status: AC
  Administered 2023-01-17: 200 [IU] via INTRAMUSCULAR

## 2023-01-17 NOTE — Addendum Note (Signed)
Addended by: Silas Sacramento T on: 01/17/2023 03:46 PM   Modules accepted: Orders

## 2023-01-17 NOTE — Progress Notes (Signed)
Xeomin Injection for spasticity using needle EMG guidance- [id number]  Dilution: 50 Units/ml Indication: Severe spasticity which interferes with ADL,mobility and/or  hygiene and is unresponsive to medication management and other conservative care Informed consent was obtained after describing risks and benefits of the procedure with the patient. This includes bleeding, bruising, infection, excessive weakness, or medication side effects. A REMS form is on file and signed. Needle: 27g 1"  needle electrode Number of units per muscle Right  Brachialis 50 units FDS 25 units estim to isolate Right 5th digit  flexion  FDP 25 units FCR 25 units  Pronator teres 50U right ADM injection with 25 U    All injections were done after obtaining appropriate EMG activity and after negative drawback for blood. The patient tolerated the procedure well. Post procedure instructions were given. A followup appointment was made.     Phenol neurolysis of the Right tibial nerve 64640  Indication: Severe spasticity in the plantar flexor muscles which is not responding to medical management and other conservative care and interfering with functional use.  Informed consent was obtained after describing the risks and benefits of the procedure with the patient this includes bleeding bruising and infection as well as medication side effects. The patient elected to proceed and has given written consent. Patient placed in a prone position on the exam table. External DC stimulation was applied to the popliteal space using a nerve stimulator. Plantar flexion twitch was obtained. The popliteal region was prepped with Betadine and then entered with a 22-gauge 40 mm needle electrode under electrical stimulation guidance. Plantar flexion which was obtained and confirmed. Then 4 cc of 5% phenol were injected. The patient tolerated procedure well. Post procedure instructions and followup visit were given.

## 2023-01-22 ENCOUNTER — Ambulatory Visit: Payer: BC Managed Care – PPO | Admitting: Cardiology

## 2023-01-24 ENCOUNTER — Ambulatory Visit: Payer: BC Managed Care – PPO | Admitting: Cardiology

## 2023-01-24 ENCOUNTER — Ambulatory Visit: Payer: Medicare Other

## 2023-01-24 DIAGNOSIS — I340 Nonrheumatic mitral (valve) insufficiency: Secondary | ICD-10-CM

## 2023-01-24 DIAGNOSIS — I480 Paroxysmal atrial fibrillation: Secondary | ICD-10-CM

## 2023-01-28 ENCOUNTER — Encounter: Payer: Self-pay | Admitting: Cardiology

## 2023-01-28 ENCOUNTER — Ambulatory Visit: Payer: Medicare Other | Admitting: Cardiology

## 2023-01-28 VITALS — BP 101/77 | HR 51 | Ht 67.5 in | Wt 166.0 lb

## 2023-01-28 DIAGNOSIS — D6869 Other thrombophilia: Secondary | ICD-10-CM

## 2023-01-28 DIAGNOSIS — I340 Nonrheumatic mitral (valve) insufficiency: Secondary | ICD-10-CM

## 2023-01-28 DIAGNOSIS — Z8673 Personal history of transient ischemic attack (TIA), and cerebral infarction without residual deficits: Secondary | ICD-10-CM

## 2023-01-28 DIAGNOSIS — I4811 Longstanding persistent atrial fibrillation: Secondary | ICD-10-CM

## 2023-01-28 DIAGNOSIS — I480 Paroxysmal atrial fibrillation: Secondary | ICD-10-CM

## 2023-01-28 MED ORDER — METOPROLOL SUCCINATE ER 100 MG PO TB24: 100.0000 mg | ORAL_TABLET | Freq: Every day | ORAL | 1 refills | Status: DC

## 2023-01-28 NOTE — Progress Notes (Unsigned)
Primary Physician/Referring:  Etta Grandchild, MD  Patient ID: Dustin Schneider, male    DOB: [birth year], 67 y.o.   MRN: [id number]  Chief Complaint  Patient presents with   Atrial Fibrillation   Follow-up    HPI:    Dustin Schneider  is a 67 y.o.  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  Patient has family history of atrial fibrillation, his father and also his brother.  Patient underwent direct-current cardioversion on 08/07/2022 and presents here for follow-up. Patient had called me on 08/14/2022 stating that he is back in atrial fibrillation.  I called in for metoprolol succinate 50 mg daily, advised him to observe for now and that he had a follow-up appointment with me soon.  Patient states that since starting metoprolol 2 days later he went back into sinus rhythm and has maintained sinus rhythm.  Presently asymptomatic.  Past Medical History:  Diagnosis Date   Allergy    Hyperlipidemia    Hypertension    NICM (nonischemic cardiomyopathy) (HCC) 06/15/2018   04/23/18: EF 45%, 09/15/18: NOrmal LVEF   Paroxysmal atrial fibrillation (HCC) 06/15/2018   Stroke [facility])    Visit for monitoring Tikosyn therapy 06/12/2018    Social History   Tobacco Use   Smoking status: Some Days    Types: Cigars   Smokeless tobacco: Never   Tobacco comments:    Once a year he will smoke a cigars   Substance Use Topics   Alcohol use: Not Currently   Marital Status: Divorced  ROS  Review of Systems  Cardiovascular:  Negative for chest pain, dyspnea on exertion and leg swelling.   Objective  Blood pressure 101/77, pulse (!) 51, height 5' 7.5" (1.715 m), weight 166 lb (75.3 kg), SpO2 93 %.     01/28/2023    3:00 PM 01/17/2023    2:20 PM 11/27/2022     2:34 PM  Vitals with BMI  Height 5' 7.5" 5' 7.5"   Weight 166 lbs 168 lbs   BMI 25.6 25.91   Systolic 101 136 409  Diastolic 77 85 73  Pulse 51 57    Physical Exam Neck:     Vascular: No carotid bruit or JVD.  Cardiovascular:     Rate and Rhythm: Normal rate and regular rhythm.     Pulses: Intact distal pulses.     Heart sounds: Murmur heard.     Mid to late systolic murmur is present with a grade of 2/6 at the apex.     No gallop.  Pulmonary:     Effort: Pulmonary effort is normal.     Breath sounds: Normal breath sounds.  Abdominal:     General: Bowel sounds are normal.     Palpations: Abdomen is soft.  Musculoskeletal:     Right lower leg: No edema.     Left lower leg: No edema.     Comments: Right hemiplegia/hemiparesis and right foot drop    Lab Results  Component Value Date   NA 140 07/30/2022   K 3.6 07/30/2022   CO2 25 07/30/2022   GLUCOSE 105 (H) 07/30/2022   BUN 15 07/30/2022   CREATININE 1.03 07/30/2022   CALCIUM 9.4 07/30/2022   GFRNONAA >60 07/30/2022   Lab Results  Component Value Date   ALT 20 07/30/2022  AST 18 07/30/2022   ALKPHOS 63 07/30/2022   BILITOT 1.0 07/30/2022      Latest Ref Rng & Units 07/30/2022    9:39 AM 01/30/2022    3:00 PM 03/27/2021    2:00 PM  CBC  WBC 4.0 - 10.5 K/uL 6.7  5.5  7.3   Hemoglobin 13.0 - 17.0 g/dL 16.1  09.6  04.5   Hematocrit 39.0 - 52.0 % 46.2  42.8  45.3   Platelets 150 - 400 K/uL 231  203.0  215.0    Lipid Panel Recent Labs    01/30/22 1500  CHOL 130  TRIG 100.0  LDLCALC 61  VLDL 20.0  HDL 49.80  CHOLHDL 3     HEMOGLOBIN A1C Lab Results  Component Value Date   HGBA1C 5.6 01/28/2020   MPG 114 01/28/2020    Lab Results  Component Value Date   TSH 2.08 03/27/2021    Allergies  No Known Allergies    Current Outpatient Medications:    apixaban (ELIQUIS) 5 MG TABS tablet, Take 1 tablet (5 mg total) by mouth 2 (two) times daily., Disp: 180 tablet, Rfl: 3   baclofen (LIORESAL) 20 MG  tablet, Take 1 tablet (20 mg total) by mouth 3 (three) times daily. (Patient taking differently: Take 20 mg by mouth 2 (two) times daily.), Disp: 60 tablet, Rfl: 2   diltiazem (CARDIZEM CD) 180 MG 24 hr capsule, TAKE ONE CAPSULE BY MOUTH DAILY, Disp: 30 capsule, Rfl: 6   dofetilide (TIKOSYN) 500 MCG capsule, TAKE ONE CAPSULE BY MOUTH TWICE DAILY, Disp: 180 capsule, Rfl: 3   metoprolol succinate (TOPROL-XL) 50 MG 24 hr tablet, Take 1 tablet (50 mg total) by mouth daily. Take with or immediately following a meal., Disp: 90 tablet, Rfl: 1   rosuvastatin (CRESTOR) 10 MG tablet, TAKE 1 TABLET BY MOUTH DAILY AFTER SUPPER, Disp: 30 tablet, Rfl: 6  Current Facility-Administered Medications:    incobotulinumtoxinA (XEOMIN) 100 units injection 200 Units, 200 Units, Intramuscular, Once, Kirsteins, Victorino Sparrow, MD  Radiology:   No results found.  Cardiac Studies:   Nuclear stress test  05/05/2018: 1. The resting electrocardiogram demonstrated atrial fibrillation. The stress electrocardiogram was positive for ischemia with 2 mm downsloping ST depression in the inferior and lateral leads, persisted for 2 mintues into recovery. Stress symptoms included palpitations and fatigue. Patient exercised on Bruce protocol for 6:00 minutes and achieved 7.05 METS. Stress test terminated due to fatigue and 113% MPHR achieved (Target HR >85%). 2. Left ventricular cavity is noted to be normal on the rest and stress studies. SPECT images demonstrate homogeneous tracer distribution throughout the myocardium. The left ventricular ejection fraction was calculated to be 38%, but visually appears to be at least low normal without wall motion abnormality. This is an intermediate risk study due to abnormal ST change and low LVEF (difficult in view of gating with A. Fib), clinical correlation recommended.  PCV ECHOCARDIOGRAM COMPLETE 01/24/2023  Narrative Echocardiogram 01/24/2023: Normal LV systolic function with visual EF 60-65%.  Left ventricle cavity is normal in size. Normal left ventricular wall thickness. Normal global wall motion. Unable to evaluate diastolic function due to atrial fibrillation. Normal LAP. Left atrial cavity is mildly dilated at 37.9 ml/m^2. Mild (Grade I) aortic regurgitation. Aortic valve sclerosis without stenosis. Mild (Grade I) mitral regurgitation. Mild tricuspid regurgitation. No evidence of pulmonary hypertension. Compared to 07/09/2022 Moderate AR is now mild, moderate/severe MR is now mild, mitral valve leaflet prolapse is not seen on current study, otherwise no  significant change.      EKG   EKG 01/28/2023: Atrial fibrillation with rapid ventricular sponsor at the rate of 105 bpm, normal axis, poor R progression, cannot exclude anteroseptal infarct old.  T wave abnormality, inferior and lateral ischemia.  QTc 406 ms.  Compared to 08/24/2022, sinus rhythm has been replaced and lateral ST segment changes are new.   Assessment     ICD-10-CM   1. Paroxysmal atrial fibrillation (HCC)  I48.0 EKG 12-Lead     No orders of the defined types were placed in this encounter.   Medications Discontinued During This Encounter  Medication Reason   bethanechol (URECHOLINE) 25 MG tablet Completed Course    CHA2DS2-VASc Score 4 (Age, stroke, HTN) and yearly risk of stroke 4.8%.   Recommendations:   Dustin Schneider  is a 67 y.o. Caucasian male with paroxysmal atrial fibrillation, maintaining sinus rhythm on Tikosyn, left MCA/ACA CVA with residual right-sided spastic phemiparesis and expressive aphasia in June 2021 due to non compliance with anticoagulation S/P thrombectomy, chronic diastolic heart failure, hyperlipidemia.  Patient has family history of atrial fibrillation, his father and also his brother.  This is post cardioversion visit.  1. Paroxysmal atrial fibrillation [facility]) Patient underwent direct-current cardioversion on 08/07/2022 and presents here for follow-up. Patient had called me on  08/14/2022 stating that he is back in atrial fibrillation.  After starting metoprolol, patient reverted back to sinus rhythm.  He is maintaining sinus rhythm today and remains asymptomatic.  Continue both beta-blocker at small to moderate dose and also calcium channel blocker therapy for now.  Not too concerned about heart block.  He is remains asymptomatic.  I have discussed with the patient and his brother-in-law who brought up the topic of ablation.  Advised him that ablation would not change therapy and he will probably need continued AAD and also anticoagulants permanently.  However if he continues to have recurrent episodes of atrial fibrillation, it would certainly be a good option.  2. MVP (mitral valve prolapse) Patient's brother-in-law is also present at the bedside, patient has moderate to severe mitral regurgitation related to mitral valve prolapse.  But on auscultation I feel that the mitral regurgitation is at most moderate if not mild to moderate.  No clinical evidence of heart failure, he remains asymptomatic.  Hence we will continue observation only for now.  3. Moderate to severe mitral regurgitation Do not think he needs surgical consultation or mitral valve repair at this point.  I would like to repeat echocardiogram 6 months since prior echocardiogram and I would like to see him back then for follow-up of both mitral regurgitation and LV systolic function.  Will decide whether he needs to have mitral valve surgery/repair/MitraClip.     Dustin Decamp, MD, [facility] 01/28/2023, 3:49 PM Office: [phone] Fax: [phone] Pager: [phone]

## 2023-01-30 ENCOUNTER — Other Ambulatory Visit: Payer: Self-pay | Admitting: Physical Medicine & Rehabilitation

## 2023-01-30 DIAGNOSIS — G8111 Spastic hemiplegia affecting right dominant side: Secondary | ICD-10-CM

## 2023-02-12 ENCOUNTER — Telehealth: Payer: Self-pay

## 2023-02-12 NOTE — Telephone Encounter (Signed)
Patients guardian is going to have to give our office a call back to have him scheduled. Patient is unable to speak very well over the phone.

## 2023-04-23 ENCOUNTER — Encounter: Payer: Self-pay | Admitting: Physical Medicine & Rehabilitation

## 2023-04-23 ENCOUNTER — Encounter: Payer: Medicare Other | Attending: Physical Medicine & Rehabilitation | Admitting: Physical Medicine & Rehabilitation

## 2023-04-23 VITALS — BP 108/74 | HR 56 | Temp 98.3°F | Ht 67.5 in | Wt 165.0 lb

## 2023-04-23 DIAGNOSIS — G8111 Spastic hemiplegia affecting right dominant side: Secondary | ICD-10-CM | POA: Insufficient documentation

## 2023-04-23 MED ORDER — PHENOL 5% (AQUEOUS) FOR INJECTION
4.0000 mL | Freq: Once | Status: AC
  Administered 2023-04-23: 4 mL

## 2023-04-23 MED ORDER — INCOBOTULINUMTOXINA 100 UNITS IM SOLR
200.0000 [IU] | Freq: Once | INTRAMUSCULAR | Status: AC
  Administered 2023-04-23: 200 [IU] via INTRAMUSCULAR

## 2023-04-23 NOTE — Progress Notes (Signed)
Xeomin Injection for spasticity using needle EMG guidance- [id number]  Dilution: 50 Units/ml Indication: Severe spasticity which interferes with ADL,mobility and/or  hygiene and is unresponsive to medication management and other conservative care Informed consent was obtained after describing risks and benefits of the procedure with the patient. This includes bleeding, bruising, infection, excessive weakness, or medication side effects. A REMS form is on file and signed. Needle: 27g 1"  needle electrode Number of units per muscle Right  Brachialis 50 units FDS 25 units estim to isolate Right 5th digit  flexion  FDP 25 units FCR 25 units  Pronator teres 50U right ADM injection with 25 U    All injections were done after obtaining appropriate EMG activity and after negative drawback for blood. The patient tolerated the procedure well. Post procedure instructions were given. A followup appointment was made.     Phenol neurolysis of the Right tibial nerve 64640  Indication: Severe spasticity in the plantar flexor muscles which is not responding to medical management and other conservative care and interfering with functional use.  Informed consent was obtained after describing the risks and benefits of the procedure with the patient this includes bleeding bruising and infection as well as medication side effects. The patient elected to proceed and has given written consent. Patient placed in a prone position on the exam table. External DC stimulation was applied to the popliteal space using a nerve stimulator. Plantar flexion twitch was obtained. The popliteal region was prepped with Betadine and then entered with a 22-gauge 40 mm needle electrode under electrical stimulation guidance. Plantar flexion which was obtained and confirmed. Then 4 cc of 5% phenol were injected. The patient tolerated procedure well. Post procedure instructions and followup visit were given.

## 2023-04-30 ENCOUNTER — Ambulatory Visit: Payer: BC Managed Care – PPO | Admitting: Cardiology

## 2023-05-07 ENCOUNTER — Other Ambulatory Visit: Payer: Self-pay | Admitting: Physical Medicine & Rehabilitation

## 2023-05-07 DIAGNOSIS — G8111 Spastic hemiplegia affecting right dominant side: Secondary | ICD-10-CM

## 2023-06-13 ENCOUNTER — Other Ambulatory Visit: Payer: Self-pay | Admitting: Cardiology

## 2023-06-21 ENCOUNTER — Other Ambulatory Visit: Payer: Self-pay

## 2023-06-21 MED ORDER — DILTIAZEM HCL ER COATED BEADS 180 MG PO CP24: 180.0000 mg | ORAL_CAPSULE | Freq: Every day | ORAL | 0 refills | Status: DC

## 2023-07-18 ENCOUNTER — Other Ambulatory Visit: Payer: BC Managed Care – PPO

## 2023-07-18 ENCOUNTER — Other Ambulatory Visit: Payer: Self-pay | Admitting: Cardiology

## 2023-07-18 ENCOUNTER — Ambulatory Visit (HOSPITAL_COMMUNITY): Payer: Medicare Other | Attending: Internal Medicine

## 2023-07-18 DIAGNOSIS — I340 Nonrheumatic mitral (valve) insufficiency: Secondary | ICD-10-CM | POA: Diagnosis not present

## 2023-07-18 DIAGNOSIS — I4811 Longstanding persistent atrial fibrillation: Secondary | ICD-10-CM | POA: Diagnosis present

## 2023-07-18 LAB — ECHOCARDIOGRAM COMPLETE
Area-P 1/2: 3.01 cm2
P 1/2 time: 731 ms
S' Lateral: 3.3 cm

## 2023-07-20 NOTE — Progress Notes (Signed)
Normal Heart function and essentially normal echo

## 2023-07-23 ENCOUNTER — Encounter: Payer: Self-pay | Admitting: Physical Medicine & Rehabilitation

## 2023-07-23 ENCOUNTER — Encounter: Payer: Medicare Other | Attending: Physical Medicine & Rehabilitation | Admitting: Physical Medicine & Rehabilitation

## 2023-07-23 VITALS — BP 131/81 | HR 52 | Ht 67.5 in | Wt 172.4 lb

## 2023-07-23 DIAGNOSIS — I724 Aneurysm of artery of lower extremity: Secondary | ICD-10-CM | POA: Insufficient documentation

## 2023-07-23 DIAGNOSIS — G8111 Spastic hemiplegia affecting right dominant side: Secondary | ICD-10-CM | POA: Insufficient documentation

## 2023-07-23 NOTE — Progress Notes (Signed)
Xeomin Injection for spasticity using needle EMG guidance- [id number]  Dilution: 50 Units/ml Indication: Severe spasticity which interferes with ADL,mobility and/or  hygiene and is unresponsive to medication management and other conservative care Informed consent was obtained after describing risks and benefits of the procedure with the patient. This includes bleeding, bruising, infection, excessive weakness, or medication side effects. A REMS form is on file and signed. Needle: 27g 1"  needle electrode Number of units per muscle Right  Brachialis 50 units FDS 25 units estim to isolate Right 5th digit  flexion  FDP 25 units FCR 25 units  Pronator teres 50U right ADM injection with 25 U    All injections were done after obtaining appropriate EMG activity and after negative drawback for blood. The patient tolerated the procedure well. Post procedure instructions were given. A followup appointment was made.    Repeat phenol neurolysis was not performed Pt is aphasic  RIght popliteal pulsatile mass, non tender to palpation  No pain with knee ROM No erythema or induration  Right knee with mild valgus deformity  No bruit with auscultation   Will ask [facility] to eval

## 2023-07-23 NOTE — Progress Notes (Signed)
Phenol was NOT given.

## 2023-07-24 ENCOUNTER — Other Ambulatory Visit: Payer: Self-pay | Admitting: Cardiology

## 2023-07-24 DIAGNOSIS — I4811 Longstanding persistent atrial fibrillation: Secondary | ICD-10-CM

## 2023-07-26 ENCOUNTER — Telehealth: Payer: Self-pay | Admitting: *Deleted

## 2023-07-26 NOTE — Telephone Encounter (Signed)
Appointment has been scheduled for August 28, 2023

## 2023-07-26 NOTE — Telephone Encounter (Signed)
Patient is due for follow up with Dr Jacinto Halim.  I placed call to patient to schedule appointment. Left message to call office

## 2023-07-30 ENCOUNTER — Ambulatory Visit: Payer: BC Managed Care – PPO | Admitting: Cardiology

## 2023-08-05 ENCOUNTER — Other Ambulatory Visit: Payer: Self-pay | Admitting: Cardiology

## 2023-08-05 ENCOUNTER — Other Ambulatory Visit: Payer: Self-pay | Admitting: Physical Medicine & Rehabilitation

## 2023-08-05 DIAGNOSIS — I480 Paroxysmal atrial fibrillation: Secondary | ICD-10-CM

## 2023-08-05 DIAGNOSIS — G8111 Spastic hemiplegia affecting right dominant side: Secondary | ICD-10-CM

## 2023-08-05 MED ORDER — APIXABAN 5 MG PO TABS: 5.0000 mg | ORAL_TABLET | Freq: Two times a day (BID) | ORAL | 0 refills | Status: DC

## 2023-08-05 NOTE — Telephone Encounter (Signed)
DUPLICATE REQUEST; SEE ELIQUIS REFILL ENCOUNTER FROM TODAY

## 2023-08-05 NOTE — Telephone Encounter (Signed)
 Pt is requesting Eliquis

## 2023-08-05 NOTE — Telephone Encounter (Signed)
Eliquis 5mg  refill request received. Patient is 67 years old, weight-78.2kg, Crea-1.03 on 07/30/22, Diagnosis-Afib, and last seen by Dr. Jacinto Halim on 01/28/23 and has an appt on 08/28/23. Dose is appropriate based on dosing criteria. Will send in refill to requested pharmacy.  Will send in limited supply and add to upcoming appts to obtain labs.

## 2023-08-05 NOTE — Telephone Encounter (Signed)
°*  STAT* If patient is at the pharmacy, call can be transferred to refill team.   1. Which medications need to be refilled? (please list name of each medication and dose if known) apixaban (ELIQUIS) 5 MG TABS tablet  2. Which pharmacy/location (including street and city if local pharmacy) is medication to be sent to? [place], [address]  3. Do they need a 30 day or 90 day supply? 90 .

## 2023-08-28 ENCOUNTER — Encounter: Payer: Self-pay | Admitting: Cardiology

## 2023-08-28 ENCOUNTER — Ambulatory Visit: Payer: Medicare PPO | Attending: Cardiology | Admitting: Cardiology

## 2023-08-28 VITALS — BP 116/68 | HR 62 | Resp 16 | Ht 67.0 in | Wt 171.6 lb

## 2023-08-28 DIAGNOSIS — I480 Paroxysmal atrial fibrillation: Secondary | ICD-10-CM | POA: Diagnosis not present

## 2023-08-28 DIAGNOSIS — I10 Essential (primary) hypertension: Secondary | ICD-10-CM | POA: Diagnosis not present

## 2023-08-28 DIAGNOSIS — D6869 Other thrombophilia: Secondary | ICD-10-CM

## 2023-08-28 NOTE — Patient Instructions (Signed)
 Medication Instructions:  Your physician recommends that you continue on your current medications as directed. Please refer to the Current Medication list given to you today.  *If you need a refill on your cardiac medications before your next appointment, please call your pharmacy*   Lab Work: Have lab work done at [facility] on first floor today.  BMET, CBC, Magnesium  If you have labs (blood work) drawn today and your tests are completely normal, you will receive your results only by: MyChart Message (if you have MyChart) OR A paper copy in the mail If you have any lab test that is abnormal or we need to change your treatment, we will call you to review the results.   Testing/Procedures: none   Follow-Up: At [facility], you and your health needs are our priority.  As part of our continuing mission to provide you with exceptional heart care, we have created designated Provider Care Teams.  These Care Teams include your primary Cardiologist (physician) and Advanced Practice Providers (APPs -  Physician Assistants and Nurse Practitioners) who all work together to provide you with the care you need, when you need it.  We recommend signing up for the patient portal called "MyChart".  Sign up information is provided on this After Visit Summary.  MyChart is used to connect with patients for Virtual Visits (Telemedicine).  Patients are able to view lab/test results, encounter notes, upcoming appointments, etc.  Non-urgent messages can be sent to your provider as well.   To learn more about what you can do with MyChart, go to [email or web address].    Your next appointment:   6 month(s)  Provider:   Knox Perl, MD     Other Instructions

## 2023-08-28 NOTE — Progress Notes (Signed)
 Cardiology Office Note:  .   Date:  08/28/2023  ID:  ALIJIAH GOHR, DOB September 20, 1955, MRN [id number] PCP: Arcadio Knuckles, MD  [facility] Providers Cardiologist:  Knox Perl, MD   History of Present Illness: .   Dustin Schneider is a 68 y.o. Caucasian male with paroxysmal atrial fibrillation on Tikosyn , left MCA/ACA CVA with residual right-sided spastic phemiparesis and expressive aphasia in June 2021 due to non compliance with anticoagulation S/P thrombectomy, chronic diastolic heart failure, hyperlipidemia.  Patient has family history of atrial fibrillation, his father and also his brother.   Patient underwent direct-current cardioversion on 08/07/2022, he had called me 10 days later stating that he was back in A-fib and had prescribed metoprolol  and patient when he returned back for office visit in in second week of January he was in sinus rhythm.  In view of patient's symptoms when he could tell he was in A-fib versus when he was back in sinus rhythm and also mild fatigue noted during A-fib episodes.  Patient presently on dofetilide  and is back in sinus rhythm.  Patient able to tell when he is in and out of A-fib.  Discussed the use of AI scribe software for clinical note transcription with the patient, who gave verbal consent to proceed.  History of Present Illness   The patient, with a history of paroxysmal atrial fibrillation, presents for a follow-up visit. He reports no recent episodes of AFib and overall, he feels fine. He is currently taking Tikosyn . The patient's speech has improved significantly since the last visit.  Essentially asymptomatic from cardiac standpoint.     Labs   Lab Results  Component Value Date   CHOL 130 01/30/2022   HDL 49.80 01/30/2022   LDLCALC 61 01/30/2022   TRIG 100.0 01/30/2022   CHOLHDL 3 01/30/2022   Lab Results  Component Value Date   NA 140 07/30/2022   K 3.6 07/30/2022   CO2 25 07/30/2022   GLUCOSE 105 (H) 07/30/2022   BUN 15  07/30/2022   CREATININE 1.03 07/30/2022   CALCIUM  9.4 07/30/2022   GFR 87.01 01/30/2022   GFRNONAA >60 07/30/2022      Latest Ref Rng & Units 07/30/2022    9:39 AM 01/30/2022    3:00 PM 03/27/2021    2:00 PM  BMP  Glucose 70 - 99 mg/dL 841  324  94   BUN 8 - 23 mg/dL 15  16  20    Creatinine 0.61 - 1.24 mg/dL 4.01  0.27  2.53   Sodium 135 - 145 mmol/L 140  140  140   Potassium 3.5 - 5.1 mmol/L 3.6  4.2  4.2   Chloride 98 - 111 mmol/L 104  103  101   CO2 22 - 32 mmol/L 25  29  32   Calcium  8.9 - 10.3 mg/dL 9.4  9.8  9.7       Latest Ref Rng & Units 07/30/2022    9:39 AM 01/30/2022    3:00 PM 03/27/2021    2:00 PM  CBC  WBC 4.0 - 10.5 K/uL 6.7  5.5  7.3   Hemoglobin 13.0 - 17.0 g/dL 66.4  40.3  47.4   Hematocrit 39.0 - 52.0 % 46.2  42.8  45.3   Platelets 150 - 400 K/uL 231  203.0  215.0    Review of Systems  Cardiovascular:  Negative for chest pain, dyspnea on exertion and leg swelling.   Physical Exam:   VS:  BP 116/68 (  BP Location: Left Arm, Patient Position: Sitting, Cuff Size: Normal)   Pulse 62   Resp 16   Ht 5\' 7"  (1.702 m)   Wt 171 lb 9.6 oz (77.8 kg)   SpO2 94%   BMI 26.88 kg/m    Wt Readings from Last 3 Encounters:  08/28/23 171 lb 9.6 oz (77.8 kg)  07/23/23 172 lb 6.4 oz (78.2 kg)  04/23/23 165 lb (74.8 kg)  Physical Exam Neck:     Vascular: No carotid bruit or JVD.  Cardiovascular:     Rate and Rhythm: Normal rate and regular rhythm.     Pulses: Intact distal pulses.     Heart sounds: Normal heart sounds. No murmur heard.    No gallop.  Pulmonary:     Effort: Pulmonary effort is normal.     Breath sounds: Normal breath sounds.  Abdominal:     General: Bowel sounds are normal.     Palpations: Abdomen is soft.  Musculoskeletal:     Right lower leg: No edema.     Left lower leg: No edema.  Neurological:     Mental Status: Mental status is at baseline.     Comments: Right hemiparesis and partial aphasia, right foot drop    Studies Reviewed: .     Echocardiogram 07/18/2023:   1. Left ventricular ejection fraction, by estimation, is 60 to 65%. The left ventricle has normal function. The left ventricle has no regional wall motion abnormalities. There is mild left ventricular hypertrophy. Left ventricular diastolic parameters  were normal. The average left ventricular global longitudinal strain is 25.4 %. The global longitudinal strain is normal.  Patient in sinus rhythm.  2. Right ventricular systolic function is normal. The right ventricular size is normal. Tricuspid regurgitation signal is inadequate for assessing PA pressure.  3. Left atrial size was mildly dilated.  4. The mitral valve is grossly normal. Mild mitral valve regurgitation.  5. The aortic valve is grossly normal. Aortic valve regurgitation is mild.  6. The inferior vena cava is normal in size with greater than 50% respiratory variability, suggesting right atrial pressure of 3 mmHg. 7. Compared to the study done on 01/24/2023, patient now in sinus rhythm, previously atrial fibrillation.  Otherwise no significant change. EKG:    EKG Interpretation Date/Time:  Wednesday August 28 2023 15:14:35 EST Ventricular Rate:  53 PR Interval:  214 QRS Duration:  78 QT Interval:  316 QTC Calculation: 296 R Axis:   4  Text Interpretation: EKG 08/28/2023: Sinus rhythm with first-degree AV block at the rate of 53 bpm, normal axis, nonspecific ST-T abnormality.  Normal QT interval. Compared to 08/07/2022, non specific ST-T change new. Confirmed by Domenico Achord, Jagadeesh [phone]) on 08/28/2023 3:31:47 PM    EKG 01/28/2023: Atrial fibrillation with rapid ventricular sponsor at the rate of 105 bpm, normal axis, poor R progression, cannot exclude anteroseptal infarct old.  T wave abnormality, inferior and lateral ischemia.  QTc 406 ms.  Compared to 08/24/2022, sinus rhythm has been replaced and lateral ST segment changes are new.   Medications and allergies    No Known Allergies   Current  Outpatient Medications:    apixaban  (ELIQUIS ) 5 MG TABS tablet, Take 1 tablet (5 mg total) by mouth 2 (two) times daily., Disp: 180 tablet, Rfl: 0   baclofen  (LIORESAL ) 20 MG tablet, TAKE ONE TABLET BY MOUTH THREE TIMES DAILY, Disp: 60 tablet, Rfl: 2   diltiazem  (CARTIA  XT) 180 MG 24 hr capsule, Take 1 capsule (180 mg  total) by mouth daily.(PLESASE CONTACT OFFICE [phone] TO SCHEDULE AN OFFICE VISIT TO GET FURTHER REFILLS, Disp: 90 capsule, Rfl: 1   dofetilide  (TIKOSYN ) 500 MCG capsule, TAKE ONE CAPSULE BY MOUTH TWICE DAILY, Disp: 180 capsule, Rfl: 3   metoprolol  succinate (TOPROL -XL) 100 MG 24 hr tablet, Take 1 tablet (100 mg total) by mouth daily. Take with or immediately following a meal., Disp: 90 tablet, Rfl: 1   rosuvastatin  (CRESTOR ) 10 MG tablet, TAKE 1 TABLET BY MOUTH DAILY AFTER SUPPER, Disp: 30 tablet, Rfl: 2   ASSESSMENT AND PLAN: .      ICD-10-CM   1. Paroxysmal atrial fibrillation (HCC)  I48.0 EKG 12-Lead    Basic Metabolic Panel (BMET)    CBC    Magnesium     2. Secondary hypercoagulable state (HCC)  D68.69 Basic Metabolic Panel (BMET)    CBC    Magnesium     3. Essential hypertension  I10 Basic Metabolic Panel (BMET)    CBC    Magnesium      Assessment and Plan    Paroxysmal Atrial Fibrillation Maintaining regular rhythm on Tikosyn . Recent echocardiogram and EKG show maintenance of sinus rhythm with normal QT interval.  Echocardiogram revealing normal LVEF.  No significant valvular abnormality.    -Order BMP, CBC, and magnesium  levels today for therapeutic drug monitoring. -Continue apixaban  5 mg twice daily  Primary hypertension: Blood pressure well-controlled on diltiazem  CD1 80 mg daily.  He is also on metoprolol  succinate 100 mg daily.  No changes in the medications were done today.  -Follow-up in six months.   Signed,  Knox Perl, MD, [facility] 08/28/2023, 8:31 PM [facility] [address] [place], [place] [address] Phone: [phone]. Fax:   [phone]

## 2023-08-30 LAB — BASIC METABOLIC PANEL
BUN/Creatinine Ratio: 23 (ref 10–24)
BUN: 21 mg/dL (ref 8–27)
CO2: 23 mmol/L (ref 20–29)
Calcium: 9.3 mg/dL (ref 8.6–10.2)
Chloride: 103 mmol/L (ref 96–106)
Creatinine, Ser: 0.91 mg/dL (ref 0.76–1.27)
Glucose: 115 mg/dL — ABNORMAL HIGH (ref 70–99)
Potassium: 4.2 mmol/L (ref 3.5–5.2)
Sodium: 144 mmol/L (ref 134–144)
eGFR: 91 mL/min/{1.73_m2} (ref >59)

## 2023-08-30 LAB — CBC
Hematocrit: 45.5 % (ref 37.5–51.0)
Hemoglobin: 14.9 g/dL (ref 13.0–17.7)
MCH: 31.6 pg (ref 26.6–33.0)
MCHC: 32.7 g/dL (ref 31.5–35.7)
MCV: 97 fL (ref 79–97)
Platelets: 210 10*3/uL (ref 150–450)
RBC: 4.71 x10E6/uL (ref 4.14–5.80)
RDW: 12.7 % (ref 11.6–15.4)
WBC: 5.9 10*3/uL (ref 3.4–10.8)

## 2023-08-30 LAB — MAGNESIUM: Magnesium: 2.2 mg/dL (ref 1.6–2.3)

## 2023-09-01 ENCOUNTER — Encounter: Payer: Self-pay | Admitting: Cardiology

## 2023-09-12 ENCOUNTER — Other Ambulatory Visit: Payer: Self-pay | Admitting: Cardiology

## 2023-10-23 ENCOUNTER — Other Ambulatory Visit: Payer: Self-pay | Admitting: Cardiology

## 2023-10-30 ENCOUNTER — Other Ambulatory Visit: Payer: Self-pay | Admitting: Cardiology

## 2023-10-30 DIAGNOSIS — I480 Paroxysmal atrial fibrillation: Secondary | ICD-10-CM

## 2023-10-31 NOTE — Telephone Encounter (Signed)
 Pt last saw Dr Jacinto Halim 08/28/23, last labs 08/28/23 Creat 0.91, age 68, weight 77.8kg, based on specified criteria pt is on appropriate dosage of Eliquis 5mg  BID for afib.  Will refill rx.

## 2023-11-07 ENCOUNTER — Other Ambulatory Visit: Payer: Self-pay | Admitting: Physical Medicine & Rehabilitation

## 2023-11-07 DIAGNOSIS — G8111 Spastic hemiplegia affecting right dominant side: Secondary | ICD-10-CM

## 2024-01-02 ENCOUNTER — Other Ambulatory Visit: Payer: Self-pay | Admitting: Cardiology

## 2024-01-02 DIAGNOSIS — I4811 Longstanding persistent atrial fibrillation: Secondary | ICD-10-CM

## 2024-01-03 ENCOUNTER — Other Ambulatory Visit: Payer: Self-pay | Admitting: *Deleted

## 2024-01-03 DIAGNOSIS — I739 Peripheral vascular disease, unspecified: Secondary | ICD-10-CM

## 2024-01-17 ENCOUNTER — Other Ambulatory Visit: Payer: Self-pay | Admitting: Cardiology

## 2024-01-21 ENCOUNTER — Ambulatory Visit (HOSPITAL_COMMUNITY)
Admission: RE | Admit: 2024-01-21 | Discharge: 2024-01-21 | Disposition: A | Discharge status: Home / Self Care | Source: Ambulatory Visit | Attending: Vascular Surgery | Admitting: Vascular Surgery

## 2024-01-21 ENCOUNTER — Ambulatory Visit: Attending: Vascular Surgery | Admitting: Vascular Surgery

## 2024-01-21 ENCOUNTER — Encounter: Payer: Self-pay | Admitting: Vascular Surgery

## 2024-01-21 VITALS — BP 128/84 | HR 59 | Temp 98.4°F | Resp 18 | Ht 67.0 in | Wt 166.9 lb

## 2024-01-21 DIAGNOSIS — I480 Paroxysmal atrial fibrillation: Secondary | ICD-10-CM

## 2024-01-21 DIAGNOSIS — I739 Peripheral vascular disease, unspecified: Secondary | ICD-10-CM | POA: Diagnosis present

## 2024-01-21 NOTE — Progress Notes (Signed)
 Patient name: Dustin Schneider MRN: [id number] DOB: 26-Nov-1955 Sex: male  REASON FOR CONSULT: Prominent left popliteal mass with concern for aneurysm  HPI: Dustin Schneider is a 68 y.o. male, with history of paroxysmal atrial fibrillation and prior left MCA ACA infarct with right-sided spastic hemiparesis and expressive aphasia from a stroke in June 2021 that presents for evaluation of prominent left popliteal mass with concern for aneurysm.  Referred by Dr. Sharl Davies with PM&R.  Patient denies any known history of aneurysm disease.  No real pain behind his knee.    Past Medical History:  Diagnosis Date   Allergy    Hyperlipidemia    Hypertension    NICM (nonischemic cardiomyopathy) (HCC) 06/15/2018   04/23/18: EF 45%, 09/15/18: NOrmal LVEF   Paroxysmal atrial fibrillation (HCC) 06/15/2018   Stroke (HCC)    Visit for monitoring Tikosyn  therapy 06/12/2018    Past Surgical History:  Procedure Laterality Date   BUBBLE STUDY  03/11/2020   Procedure: BUBBLE STUDY;  Surgeon: Knox Perl, MD;  Location: [facility] ENDOSCOPY;  Service: Cardiovascular;;   CARDIOVERSION N/A 05/13/2018   Procedure: CARDIOVERSION;  Surgeon: Knox Perl, MD;  Location: [facility] ENDOSCOPY;  Service: Cardiovascular;  Laterality: N/A;   CARDIOVERSION N/A 06/13/2018   Procedure: CARDIOVERSION;  Surgeon: Loyde Rule, MD;  Location: [facility] ENDOSCOPY;  Service: Cardiovascular;  Laterality: N/A;   CARDIOVERSION N/A 06/23/2018   Procedure: CARDIOVERSION;  Surgeon: Knox Perl, MD;  Location: [facility] ENDOSCOPY;  Service: Cardiovascular;  Laterality: N/A;   CARDIOVERSION N/A 03/11/2020   Procedure: CARDIOVERSION;  Surgeon: Knox Perl, MD;  Location: [facility] ENDOSCOPY;  Service: Cardiovascular;  Laterality: N/A;   CARDIOVERSION N/A 08/07/2022   Procedure: CARDIOVERSION;  Surgeon: Knox Perl, MD;  Location: [facility] ENDOSCOPY;  Service: Cardiovascular;  Laterality: N/A;   excision of actinic keratosis     EYE SURGERY     eye lift   IR CT HEAD LTD   01/27/2020   IR PERCUTANEOUS ART THROMBECTOMY/INFUSION INTRACRANIAL INC DIAG ANGIO  01/27/2020       IR PERCUTANEOUS ART THROMBECTOMY/INFUSION INTRACRANIAL INC DIAG ANGIO  01/27/2020   RADIOLOGY WITH ANESTHESIA N/A 01/27/2020   Procedure: IR WITH ANESTHESIA;  Surgeon: Radiologist, Medication, MD;  Location: [facility] OR;  Service: Radiology;  Laterality: N/A;   TEE WITHOUT CARDIOVERSION N/A 03/11/2020   Procedure: TRANSESOPHAGEAL ECHOCARDIOGRAM (TEE);  Surgeon: Knox Perl, MD;  Location: [facility] ENDOSCOPY;  Service: Cardiovascular;  Laterality: N/A;   TONSILLECTOMY     WISDOM TOOTH EXTRACTION      Family History  Problem Relation Age of Onset   Glaucoma Mother    Atrial fibrillation Father     SOCIAL HISTORY: Social History   Socioeconomic History   Marital status: Divorced    Spouse name: Not on file   Number of children: 1   Years of education: Not on file   Highest education level: Not on file  Occupational History   Not on file  Tobacco Use   Smoking status: Former    Types: Cigars   Smokeless tobacco: Never   Tobacco comments:    Once a year he will smoke a cigars   Vaping Use   Vaping status: Never Used  Substance and Sexual Activity   Alcohol use: Not Currently   Drug use: No   Sexual activity: Not on file    Comment: DIVORCE  Other Topics Concern   Not on file  Social History Narrative   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[employer]  Lives alone in [place]   Social Drivers of Health   Financial Resource Strain: Not on file  Food Insecurity: Not on file  Transportation Needs: Not on file  Physical Activity: Not on file  Stress: Not on file  Social Connections: Not on file  Intimate Partner Violence: Not on file    No Known Allergies  Current Outpatient Medications  Medication Sig Dispense Refill   apixaban  (ELIQUIS ) 5 MG TABS tablet TAKE ONE TABLET BY MOUTH TWICE DAILY 180 tablet 2   baclofen  (LIORESAL ) 20 MG tablet TAKE ONE TABLET BY MOUTH THREE TIMES DAILY 60 tablet 2    diltiazem  (CARDIZEM  CD) 180 MG 24 hr capsule Take 1 capsule (180 mg total) by mouth daily. 90 capsule 2   dofetilide  (TIKOSYN ) 500 MCG capsule TAKE ONE CAPSULE BY MOUTH TWICE DAILY. 180 capsule 3   metoprolol  succinate (TOPROL -XL) 100 MG 24 hr tablet Take 1 tablet (100 mg total) by mouth daily. Take with or immediately following a meal. 90 tablet 0   rosuvastatin  (CRESTOR ) 10 MG tablet TAKE 1 TABLET BY MOUTH DAILY AFTER SUPPER 30 tablet 11   No current facility-administered medications for this visit.    REVIEW OF SYSTEMS:  [X]  denotes positive finding, [ ]  denotes negative finding Cardiac  Comments:  Chest pain or chest pressure:    Shortness of breath upon exertion:    Short of breath when lying flat:    Irregular heart rhythm:        Vascular    Pain in calf, thigh, or hip brought on by ambulation:    Pain in feet at night that wakes you up from your sleep:     Blood clot in your veins:    Leg swelling:         Pulmonary    Oxygen at home:    Productive cough:     Wheezing:         Neurologic    Sudden weakness in arms or legs:     Sudden numbness in arms or legs:     Sudden onset of difficulty speaking or slurred speech:    Temporary loss of vision in one eye:     Problems with dizziness:         Gastrointestinal    Blood in stool:     Vomited blood:         Genitourinary    Burning when urinating:     Blood in urine:        Psychiatric    Major depression:         Hematologic    Bleeding problems:    Problems with blood clotting too easily:        Skin    Rashes or ulcers:        Constitutional    Fever or chills:      PHYSICAL EXAM: Vitals:   01/21/24 1443  BP: 128/84  Pulse: (!) 59  Resp: 18  Temp: 98.4 F (36.9 C)  TempSrc: Temporal  SpO2: 95%  Weight: 166 lb 14.4 oz (75.7 kg)  Height: 5\' 7"  (1.702 m)    GENERAL: The patient is a well-nourished male, in no acute distress. The vital signs are documented above. CARDIAC: There is a  regular rate and rhythm.  VASCULAR:  Bilateral femoral pulses palpable Bilateral popliteal pulses palpable Bilateral PT pulses palpable PULMONARY: No respiratory distress. ABDOMEN: Soft and non-tender. MUSCULOSKELETAL: There are no major deformities or cyanosis. NEUROLOGIC: No focal weakness  or paresthesias are detected. SKIN: There are no ulcers or rashes noted. PSYCHIATRIC: The patient has a normal affect.  DATA:   Lower extremity arterial duplex today shows no evidence of popliteal aneurysm or stenosis.  Assessment/Plan:  68 y.o. male, with history of paroxysmal atrial fibrillation and prior left MCA ACA infarct with right-sided spastic hemiparesis and expressive aphasia from a stroke in June 2021 that presents for evaluation of prominent left popliteal mass with concern for aneurysm.  Discussed that his duplex today shows no evidence of aneurysm disease or stenosis.  He has palpable pedal pulses.  He can follow with me as needed.   Young Hensen, MD [facility] Office: [phone]

## 2024-01-22 ENCOUNTER — Telehealth: Payer: Self-pay | Admitting: Cardiology

## 2024-01-22 NOTE — Telephone Encounter (Signed)
 Paper Work Dropped Off: Medical History & Physician Statement  Date:01/22/24  Location of paper: [facility]

## 2024-01-24 NOTE — Telephone Encounter (Signed)
 Form completed by Dr Berry Bristol and picked up in office by [name]

## 2024-02-18 ENCOUNTER — Ambulatory Visit: Admitting: Podiatry

## 2024-02-20 ENCOUNTER — Other Ambulatory Visit: Payer: Self-pay | Admitting: Physical Medicine & Rehabilitation

## 2024-02-20 DIAGNOSIS — G8111 Spastic hemiplegia affecting right dominant side: Secondary | ICD-10-CM

## 2024-04-01 ENCOUNTER — Other Ambulatory Visit: Payer: Self-pay | Admitting: Cardiology

## 2024-04-01 DIAGNOSIS — I480 Paroxysmal atrial fibrillation: Secondary | ICD-10-CM

## 2024-04-01 DIAGNOSIS — I4811 Longstanding persistent atrial fibrillation: Secondary | ICD-10-CM

## 2024-05-01 ENCOUNTER — Other Ambulatory Visit: Payer: Self-pay | Admitting: Cardiology

## 2024-05-01 DIAGNOSIS — I4811 Longstanding persistent atrial fibrillation: Secondary | ICD-10-CM

## 2024-05-18 ENCOUNTER — Other Ambulatory Visit: Payer: Self-pay | Admitting: Physical Medicine & Rehabilitation

## 2024-05-18 DIAGNOSIS — G8111 Spastic hemiplegia affecting right dominant side: Secondary | ICD-10-CM

## 2024-08-20 ENCOUNTER — Other Ambulatory Visit: Payer: Self-pay | Admitting: Physical Medicine & Rehabilitation

## 2024-08-20 DIAGNOSIS — G8111 Spastic hemiplegia affecting right dominant side: Secondary | ICD-10-CM

## 2024-08-24 ENCOUNTER — Telehealth: Payer: Self-pay

## 2024-08-24 NOTE — Telephone Encounter (Unsigned)
 Copied from CRM #[id number]. Topic: Appointments - Transfer of Care >> Aug 24, 2024 12:01 PM Roselie BROCKS wrote: Pt is requesting to transfer FROM: DR Debby Molt Pt is requesting to transfer TO: Philippe Brunt Reason for requested transfer: new doctor It is the responsibility of the team the patient would like to transfer to (Dr. Philippe Brunt) to reach out to the patient if for any reason this transfer is not acceptable.

## 2024-08-24 NOTE — Telephone Encounter (Signed)
 Yes, I am ok with this

## 2024-09-01 ENCOUNTER — Other Ambulatory Visit: Payer: Self-pay | Admitting: Cardiology

## 2024-09-01 DIAGNOSIS — I480 Paroxysmal atrial fibrillation: Secondary | ICD-10-CM

## 2024-09-01 DIAGNOSIS — I4811 Longstanding persistent atrial fibrillation: Secondary | ICD-10-CM

## 2024-09-01 NOTE — Telephone Encounter (Signed)
 Patients pharmacy sent a request for medication refill. I tried to contact patient to schedule follow up appointment, NA, LMAM. Please contact him to see if we can get an appointment scheduled.

## 2024-09-04 ENCOUNTER — Telehealth: Payer: Self-pay | Admitting: Cardiology

## 2024-09-04 DIAGNOSIS — I4811 Longstanding persistent atrial fibrillation: Secondary | ICD-10-CM

## 2024-09-04 DIAGNOSIS — I480 Paroxysmal atrial fibrillation: Secondary | ICD-10-CM

## 2024-09-04 NOTE — Telephone Encounter (Signed)
" °*  STAT* If patient is at the pharmacy, call can be transferred to refill team.   1. Which medications need to be refilled? (please list name of each medication and dose if known) apixaban  (ELIQUIS ) 5 MG TABS tablet    metoprolol  succinate (TOPROL -XL) 100 MG 24 hr tablet    2. Which pharmacy/location (including street and city if local pharmacy) is medication to be sent to? [facility] [place], [place] - [address]   3. Do they need a 30 day or 90 day supply? 90  "

## 2024-09-08 MED ORDER — APIXABAN 5 MG PO TABS: 5.0000 mg | ORAL_TABLET | Freq: Two times a day (BID) | ORAL | 0 refills | Status: AC

## 2024-09-08 MED ORDER — METOPROLOL SUCCINATE ER 100 MG PO TB24: 100.0000 mg | ORAL_TABLET | Freq: Every day | ORAL | 0 refills | Status: AC

## 2024-09-17 ENCOUNTER — Other Ambulatory Visit: Payer: Self-pay | Admitting: Cardiology

## 2024-09-18 NOTE — Telephone Encounter (Signed)
 Pt overdue for a follow up, LVMTCB to schedule. Already refilled 09/08/24.

## 2024-10-05 ENCOUNTER — Encounter: Admitting: Family Medicine
